# Patient Record
Sex: Male | Born: 1965 | Race: White | Hispanic: No | Marital: Single | Smoking: Current every day smoker
Health system: Southern US, Community
[De-identification: ages and names within clinical notes are randomized; demographics above are authoritative.]

## PROBLEM LIST (undated history)

## (undated) DIAGNOSIS — I639 Cerebral infarction, unspecified: Secondary | ICD-10-CM

## (undated) DIAGNOSIS — I519 Heart disease, unspecified: Secondary | ICD-10-CM

## (undated) DIAGNOSIS — I2510 Atherosclerotic heart disease of native coronary artery without angina pectoris: Secondary | ICD-10-CM

## (undated) DIAGNOSIS — I701 Atherosclerosis of renal artery: Secondary | ICD-10-CM

## (undated) DIAGNOSIS — R51 Headache: Secondary | ICD-10-CM

## (undated) DIAGNOSIS — E785 Hyperlipidemia, unspecified: Secondary | ICD-10-CM

## (undated) DIAGNOSIS — F418 Other specified anxiety disorders: Secondary | ICD-10-CM

## (undated) DIAGNOSIS — E119 Type 2 diabetes mellitus without complications: Secondary | ICD-10-CM

## (undated) DIAGNOSIS — K219 Gastro-esophageal reflux disease without esophagitis: Secondary | ICD-10-CM

## (undated) DIAGNOSIS — I10 Essential (primary) hypertension: Secondary | ICD-10-CM

## (undated) DIAGNOSIS — I739 Peripheral vascular disease, unspecified: Secondary | ICD-10-CM

## (undated) DIAGNOSIS — K859 Acute pancreatitis without necrosis or infection, unspecified: Secondary | ICD-10-CM

## (undated) DIAGNOSIS — F1020 Alcohol dependence, uncomplicated: Secondary | ICD-10-CM

## (undated) HISTORY — PX: OTHER SURGICAL HISTORY: SHX169

## (undated) HISTORY — DX: Acute pancreatitis without necrosis or infection, unspecified: K85.90

## (undated) HISTORY — DX: Essential (primary) hypertension: I10

## (undated) HISTORY — DX: Gastro-esophageal reflux disease without esophagitis: K21.9

## (undated) HISTORY — DX: Heart disease, unspecified: I51.9

## (undated) HISTORY — DX: Headache: R51

## (undated) SURGERY — CORONARY STENT INTERVENTION
Anesthesia: Moderate Sedation | Laterality: Left

---

## 2007-12-03 ENCOUNTER — Ambulatory Visit: Payer: Self-pay | Admitting: Gastroenterology

## 2007-12-09 ENCOUNTER — Ambulatory Visit: Payer: Self-pay | Admitting: Gastroenterology

## 2007-12-09 ENCOUNTER — Encounter: Payer: Self-pay | Admitting: Gastroenterology

## 2007-12-16 ENCOUNTER — Ambulatory Visit: Payer: Self-pay | Admitting: Gastroenterology

## 2007-12-28 ENCOUNTER — Ambulatory Visit: Payer: Self-pay | Admitting: Gastroenterology

## 2008-01-05 ENCOUNTER — Ambulatory Visit: Payer: Self-pay | Admitting: Gastroenterology

## 2008-01-12 ENCOUNTER — Ambulatory Visit: Payer: Self-pay | Admitting: Gastroenterology

## 2008-01-26 ENCOUNTER — Ambulatory Visit: Payer: Self-pay | Admitting: Gastroenterology

## 2008-03-21 ENCOUNTER — Ambulatory Visit: Payer: Self-pay | Admitting: Gastroenterology

## 2009-06-27 ENCOUNTER — Inpatient Hospital Stay: Payer: Self-pay | Admitting: Internal Medicine

## 2009-09-23 DIAGNOSIS — K859 Acute pancreatitis without necrosis or infection, unspecified: Secondary | ICD-10-CM

## 2009-09-23 HISTORY — DX: Acute pancreatitis without necrosis or infection, unspecified: K85.90

## 2010-01-23 ENCOUNTER — Inpatient Hospital Stay (HOSPITAL_COMMUNITY): Admission: EM | Admit: 2010-01-23 | Discharge: 2010-01-25 | Payer: Self-pay | Admitting: Emergency Medicine

## 2010-12-11 LAB — CBC
HCT: 34.7 % — ABNORMAL LOW (ref 39.0–52.0)
HCT: 35.2 % — ABNORMAL LOW (ref 39.0–52.0)
HCT: 42 % (ref 39.0–52.0)
Hemoglobin: 12.1 g/dL — ABNORMAL LOW (ref 13.0–17.0)
Hemoglobin: 12.1 g/dL — ABNORMAL LOW (ref 13.0–17.0)
Hemoglobin: 14.8 g/dL (ref 13.0–17.0)
MCHC: 35.3 g/dL (ref 30.0–36.0)
MCV: 106.9 fL — ABNORMAL HIGH (ref 78.0–100.0)
Platelets: 112 10*3/uL — ABNORMAL LOW (ref 150–400)
RDW: 15.8 % — ABNORMAL HIGH (ref 11.5–15.5)
RDW: 15.8 % — ABNORMAL HIGH (ref 11.5–15.5)
WBC: 10.2 10*3/uL (ref 4.0–10.5)
WBC: 11.3 10*3/uL — ABNORMAL HIGH (ref 4.0–10.5)

## 2010-12-11 LAB — DIFFERENTIAL
Basophils Relative: 1 % (ref 0–1)
Eosinophils Absolute: 0 10*3/uL (ref 0.0–0.7)
Monocytes Absolute: 0.5 10*3/uL (ref 0.1–1.0)

## 2010-12-11 LAB — COMPREHENSIVE METABOLIC PANEL
ALT: 108 U/L — ABNORMAL HIGH (ref 0–53)
ALT: 70 U/L — ABNORMAL HIGH (ref 0–53)
AST: 106 U/L — ABNORMAL HIGH (ref 0–37)
AST: 146 U/L — ABNORMAL HIGH (ref 0–37)
Albumin: 2.6 g/dL — ABNORMAL LOW (ref 3.5–5.2)
Alkaline Phosphatase: 135 U/L — ABNORMAL HIGH (ref 39–117)
Alkaline Phosphatase: 169 U/L — ABNORMAL HIGH (ref 39–117)
BUN: 8 mg/dL (ref 6–23)
CO2: 26 mEq/L (ref 19–32)
CO2: 27 mEq/L (ref 19–32)
Calcium: 8.2 mg/dL — ABNORMAL LOW (ref 8.4–10.5)
Calcium: 8.7 mg/dL (ref 8.4–10.5)
Chloride: 106 mEq/L (ref 96–112)
Creatinine, Ser: 0.72 mg/dL (ref 0.4–1.5)
Creatinine, Ser: 0.72 mg/dL (ref 0.4–1.5)
Creatinine, Ser: 0.78 mg/dL (ref 0.4–1.5)
GFR calc Af Amer: >60 mL/min (ref >60)
GFR calc Af Amer: >60 mL/min (ref >60)
GFR calc non Af Amer: >60 mL/min (ref >60)
Glucose, Bld: 100 mg/dL — ABNORMAL HIGH (ref 70–99)
Potassium: 3.1 mEq/L — ABNORMAL LOW (ref 3.5–5.1)
Sodium: 138 mEq/L (ref 135–145)
Sodium: 139 mEq/L (ref 135–145)
Total Bilirubin: 0.7 mg/dL (ref 0.3–1.2)
Total Bilirubin: 1.2 mg/dL (ref 0.3–1.2)
Total Protein: 5.7 g/dL — ABNORMAL LOW (ref 6.0–8.3)

## 2010-12-11 LAB — LIPASE, BLOOD: Lipase: 51 U/L (ref 11–59)

## 2011-02-20 ENCOUNTER — Encounter: Payer: Self-pay | Admitting: Internal Medicine

## 2011-02-20 ENCOUNTER — Emergency Department (HOSPITAL_COMMUNITY): Payer: Self-pay

## 2011-02-20 ENCOUNTER — Observation Stay (HOSPITAL_COMMUNITY)
Admission: EM | Admit: 2011-02-20 | Discharge: 2011-02-21 | Disposition: A | Discharge status: Home / Self Care | Payer: Self-pay | Attending: Internal Medicine | Admitting: Internal Medicine

## 2011-02-20 DIAGNOSIS — F1010 Alcohol abuse, uncomplicated: Secondary | ICD-10-CM | POA: Insufficient documentation

## 2011-02-20 DIAGNOSIS — F17200 Nicotine dependence, unspecified, uncomplicated: Secondary | ICD-10-CM | POA: Insufficient documentation

## 2011-02-20 DIAGNOSIS — K859 Acute pancreatitis without necrosis or infection, unspecified: Secondary | ICD-10-CM

## 2011-02-20 DIAGNOSIS — G40909 Epilepsy, unspecified, not intractable, without status epilepticus: Secondary | ICD-10-CM | POA: Insufficient documentation

## 2011-02-20 DIAGNOSIS — R079 Chest pain, unspecified: Principal | ICD-10-CM | POA: Insufficient documentation

## 2011-02-20 DIAGNOSIS — E785 Hyperlipidemia, unspecified: Secondary | ICD-10-CM | POA: Insufficient documentation

## 2011-02-20 DIAGNOSIS — R059 Cough, unspecified: Secondary | ICD-10-CM | POA: Insufficient documentation

## 2011-02-20 DIAGNOSIS — R509 Fever, unspecified: Secondary | ICD-10-CM | POA: Insufficient documentation

## 2011-02-20 DIAGNOSIS — R05 Cough: Secondary | ICD-10-CM | POA: Insufficient documentation

## 2011-02-20 LAB — CBC
HCT: 42.6 % (ref 39.0–52.0)
MCH: 34.5 pg — ABNORMAL HIGH (ref 26.0–34.0)
MCV: 97.9 fL (ref 78.0–100.0)
RBC: 4.35 MIL/uL (ref 4.22–5.81)

## 2011-02-20 LAB — CARDIAC PANEL(CRET KIN+CKTOT+MB+TROPI)
CK, MB: 1.5 ng/mL (ref 0.3–4.0)
Relative Index: 1.3 (ref 0.0–2.5)
Total CK: 56 U/L (ref 7–232)
Troponin I: <0.3 ng/mL (ref <0.30)
Troponin I: <0.3 ng/mL (ref <0.30)

## 2011-02-20 LAB — DIFFERENTIAL
Basophils Absolute: 0 10*3/uL (ref 0.0–0.1)
Basophils Relative: 0 % (ref 0–1)
Eosinophils Absolute: 0 10*3/uL (ref 0.0–0.7)
Lymphocytes Relative: 11 % — ABNORMAL LOW (ref 12–46)
Monocytes Absolute: 1.1 10*3/uL — ABNORMAL HIGH (ref 0.1–1.0)
Monocytes Relative: 12 % (ref 3–12)

## 2011-02-20 LAB — URINE MICROSCOPIC-ADD ON

## 2011-02-20 LAB — URINALYSIS, ROUTINE W REFLEX MICROSCOPIC
Glucose, UA: NEGATIVE mg/dL
Hgb urine dipstick: NEGATIVE
Protein, ur: NEGATIVE mg/dL
Urobilinogen, UA: 1 mg/dL (ref 0.0–1.0)

## 2011-02-20 LAB — COMPREHENSIVE METABOLIC PANEL
ALT: 59 U/L — ABNORMAL HIGH (ref 0–53)
AST: 52 U/L — ABNORMAL HIGH (ref 0–37)
Albumin: 3.5 g/dL (ref 3.5–5.2)
CO2: 28 mEq/L (ref 19–32)
Creatinine, Ser: 0.54 mg/dL (ref 0.4–1.5)
GFR calc non Af Amer: >60 mL/min (ref >60)
Sodium: 134 mEq/L — ABNORMAL LOW (ref 135–145)
Total Protein: 7.3 g/dL (ref 6.0–8.3)

## 2011-02-20 LAB — TROPONIN I: Troponin I: <0.3 ng/mL (ref <0.30)

## 2011-02-20 LAB — LIPASE, BLOOD: Lipase: 240 U/L — ABNORMAL HIGH (ref 11–59)

## 2011-02-20 MED ORDER — IOHEXOL 300 MG/ML  SOLN
75.0000 mL | Freq: Once | INTRAMUSCULAR | Status: AC | PRN
  Administered 2011-02-20: 75 mL via INTRAVENOUS

## 2011-02-20 NOTE — Progress Notes (Unsigned)
Hospital Admission Note Date: 02/20/2011  Patient name: Brandon [name] Medical record number: [id number] Date of birth: 1965/10/30 Age: 45 y.o. Gender: male PCP: No primary provider on file.  Medical Service: Internal Medicine  Attending physician:  Dr. Blanch Media    Resident [phone]):  Dr. Narda Bonds   Pager: [phone] Resident (R1):  Dr. Baltazar Apo   Pager: [phone]  Chief Complaint: Acute Pancreatitis   History of Present Illness:   Brandon [name] is a 45 year old man with pmh significant for Alcohol abuse, hx of pancreatitis back in May 2011 2/2 alcohol who presents to the ED today for pain located in his left chest and epigastrium which radiated to his left arm, and shoulder. He described the pain as sharp and 8/10 in intensity. The patient states this pain is different from his pain that he had back in May of last year. He states he was short of breath and was sweating when this pain first began. He also endorses feeling nauseated, however did not vomit. He also complained of constipation and states his last bm was a few days ago. The chest pain was aggravated by certain movements such as laying on that side, and relieved by Dilaudid and Ketoralac that was given in the ED. Of note, patient has a heavy alcohol abuse history. His fiance states he has several alcohol beverages daily. His last drink was on Memorial Day. His fiance also states he gets "shaky" when he does not have alcohol. Upon receiving pain medication, patient was no longer diaphoretic, or short of breath, and states he had felt better, and wanted to eat something.    Allergies: NKDA  Past Medical History  1.) Pancreatitis 2.) Alcoholism  3.) ? HLD 4.) Tobacco abuse 5.) Anxiety    Social History  . Marital Status: Single - Common Law, lives with girlfriend for last 5 years  . Years of Education: Completed high school and finished some college   Occupational History  . 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 Smoking status: Current smoker, 1 PPD for last 20 years   . Alcohol Use: Current everyday - heavy  . Drug Use: None   Family History:  Mother - deceased 2 years ago from stroke, also had significant CAD, with CABG in past Father - alive, hx of DM and HTN  Review of Systems: Pertinent items are noted in HPI.  Physical Exam:  T: 98.1  BP: 156/101  P:89  R:20  O2 Sats: 98% RA  General appearance: alert and cooperative Head: Normocephalic, without obvious abnormality, atraumatic Lungs: clear to auscultation bilaterally Heart: regular rate and rhythm, S1, S2 normal, no murmur, click, rub or gallop Abdomen: soft, non-tender; bowel sounds normal; no masses,  no organomegaly Extremities: extremities normal, atraumatic, no cyanosis or edema Pulses: 2+ and symmetric Skin: Skin color, texture, turgor normal. No rashes or lesions Neurologic: Grossly normal  Lab results:  CBC:    Component Value Date/Time   WBC 9.8 02/20/2011 0954   HGB 15.0 02/20/2011 0954   HCT 42.6 02/20/2011 0954   PLT 105* 02/20/2011 0954   MCV 97.9 02/20/2011 0954   NEUTROABS 7.6 02/20/2011 0954   LYMPHSABS 1.0 02/20/2011 0954   MONOABS 1.1* 02/20/2011 0954   EOSABS 0.0 02/20/2011 0954   BASOSABS 0.0 02/20/2011 0954     Comprehensive Metabolic Panel:    Component Value Date/Time   NA 134* 02/20/2011 0954  K 3.7 02/20/2011 0954   CL 95* 02/20/2011 0954   CO2 28 02/20/2011 0954   BUN 8 02/20/2011 0954   CREATININE 0.54 02/20/2011 0954   GLUCOSE 163* 02/20/2011 0954   CALCIUM 9.3 02/20/2011 0954   AST 52* 02/20/2011 0954   ALT 59* 02/20/2011 0954   ALKPHOS 151* 02/20/2011 0954   BILITOT 0.9 02/20/2011 0954   PROT 7.3 02/20/2011 0954   ALBUMIN 3.5 02/20/2011 0954    Lab Results  Component Value Date   LIPASE 240* 02/20/2011    Lab Results  Component Value Date   CKTOTAL 73 02/20/2011   TROPONINI  Value: <0.30        Due to the release kinetics of cTnI, a  negative result within the first hours of the onset of symptoms does not rule out myocardial infarction with certainty. If myocardial infarction is still suspected, repeat the test at appropriate intervals. **Please note change in reference range.*** 02/20/2011    Color, Urine                             AMBER      a      YELLOW    BIOCHEMICALS MAY BE AFFECTED BY COLOR  Appearance                               CLEAR             CLEAR  Specific Gravity                         1.016             1.005-1.030  pH                                       6.5               5.0-8.0  Urine Glucose                            NEGATIVE          NEG              mg/dL  Bilirubin                                SMALL      a      NEG  Ketones                                  15         a      NEG              mg/dL  Blood                                    NEGATIVE          NEG  Protein  NEGATIVE          NEG              mg/dL  Urobilinogen                             1.0               0.0-1.0          mg/dL  Nitrite                                  NEGATIVE          NEG  Leukocytes                               TRACE      a      NEG  Imaging results:   CT Angio -  IMPRESSION:    1.  No evidence for pulmonary emboli.   2.  Probable scarring in the right middle lobe and right lower lobe   - no definite active pulmonary disease.   3.  Thoracic aorta normal.   4.  Fatty infiltration of the liver.   5.  Question changes of pancreatitis.  See report.  CXR - 2 View: IMPRESSION: Chronic bronchitic type changes - no acute findings.   Assessment & Plan by Problem:  1.) Chest Pain/Epigastric Pain - Given prior history of pancreatitis and elevated lipase, this is likely secondary to acute pancreatitis. Patient was previously admitted in May 2011 for pancreatitis that was thought to be secondary to alcohol. Patient had U/S and CT abd/pelvis that showed inflammation of the pancreas  with pseudocyst. U/S was negative for gallstones. Given description of pain in the chest, associated with diaphoresis and radiation to left arm, this may be cardiac in nature, due to acute coronary syndrome. Gastritis is also on the differential given clinical presentation and alcohol history. Patient also had CT angio of chest done to rule out PE, and of note, it was negative. CXR negative for widened mediastinum, and no other findings to suggest dissection.   Plan: -Admit to telemetry -Cycle cardiac enzymes -Dilaudid for pain control  -NPO, and bowel rest -NS at 125 cc/hr  -12-Lead EKG  -FLP and Hgb A1C for risk stratification as well as checking triglycerides, given pancreatitis  -PPI   2. Alcohol abuse and possible withdrawal - Patient admits to heavy alcohol use. Last drink on Memorial Day. Has tried to quit in the past, but unsuccessful.   Plan: -Admit to telemetry -CIWA protocol (Thiamine, and Folate) -Transfer to SDU if unstable  -SW consult to assist with counseling and cessation   3. Tobacco abuse - Nicotine patch   4. VTE: Lovenox

## 2011-02-21 LAB — BASIC METABOLIC PANEL
Calcium: 8.5 mg/dL (ref 8.4–10.5)
Glucose, Bld: 94 mg/dL (ref 70–99)
Potassium: 3.4 mEq/L — ABNORMAL LOW (ref 3.5–5.1)
Sodium: 136 mEq/L (ref 135–145)

## 2011-02-21 LAB — CBC
HCT: 40.6 % (ref 39.0–52.0)
Hemoglobin: 14.1 g/dL (ref 13.0–17.0)
MCHC: 34.7 g/dL (ref 30.0–36.0)
RDW: 13.4 % (ref 11.5–15.5)
WBC: 7 10*3/uL (ref 4.0–10.5)

## 2011-02-21 LAB — LIPID PANEL
Cholesterol: 150 mg/dL (ref 0–200)
HDL: 33 mg/dL — ABNORMAL LOW (ref >39)
Triglycerides: 101 mg/dL (ref <150)

## 2011-02-25 ENCOUNTER — Encounter: Payer: Self-pay | Admitting: Internal Medicine

## 2011-02-25 ENCOUNTER — Ambulatory Visit (INDEPENDENT_AMBULATORY_CARE_PROVIDER_SITE_OTHER): Payer: Self-pay | Admitting: Internal Medicine

## 2011-02-25 VITALS — BP 109/71 | HR 92 | Temp 97.4°F | Ht 73.0 in | Wt 218.0 lb

## 2011-02-25 DIAGNOSIS — R3989 Other symptoms and signs involving the genitourinary system: Secondary | ICD-10-CM | POA: Insufficient documentation

## 2011-02-25 DIAGNOSIS — N399 Disorder of urinary system, unspecified: Secondary | ICD-10-CM

## 2011-02-25 DIAGNOSIS — F10239 Alcohol dependence with withdrawal, unspecified: Secondary | ICD-10-CM | POA: Insufficient documentation

## 2011-02-25 DIAGNOSIS — K859 Acute pancreatitis without necrosis or infection, unspecified: Secondary | ICD-10-CM | POA: Insufficient documentation

## 2011-02-25 DIAGNOSIS — K7689 Other specified diseases of liver: Secondary | ICD-10-CM

## 2011-02-25 DIAGNOSIS — D696 Thrombocytopenia, unspecified: Secondary | ICD-10-CM

## 2011-02-25 DIAGNOSIS — K219 Gastro-esophageal reflux disease without esophagitis: Secondary | ICD-10-CM | POA: Insufficient documentation

## 2011-02-25 DIAGNOSIS — K5900 Constipation, unspecified: Secondary | ICD-10-CM | POA: Insufficient documentation

## 2011-02-25 DIAGNOSIS — F1020 Alcohol dependence, uncomplicated: Secondary | ICD-10-CM

## 2011-02-25 DIAGNOSIS — N529 Male erectile dysfunction, unspecified: Secondary | ICD-10-CM

## 2011-02-25 DIAGNOSIS — R569 Unspecified convulsions: Secondary | ICD-10-CM

## 2011-02-25 DIAGNOSIS — G4700 Insomnia, unspecified: Secondary | ICD-10-CM

## 2011-02-25 DIAGNOSIS — I10 Essential (primary) hypertension: Secondary | ICD-10-CM | POA: Insufficient documentation

## 2011-02-25 DIAGNOSIS — K760 Fatty (change of) liver, not elsewhere classified: Secondary | ICD-10-CM | POA: Insufficient documentation

## 2011-02-25 MED ORDER — ESOMEPRAZOLE MAGNESIUM 20 MG PO CPDR: 20.0000 mg | DELAYED_RELEASE_CAPSULE | Freq: Every day | ORAL | Status: DC

## 2011-02-25 MED ORDER — DOCUSATE SODIUM 100 MG PO CAPS: 100.0000 mg | ORAL_CAPSULE | Freq: Two times a day (BID) | ORAL | Status: DC | PRN

## 2011-02-25 MED ORDER — POLYETHYLENE GLYCOL 3350 17 GM/SCOOP PO POWD: 17.0000 g | Freq: Every day | ORAL | Status: DC | PRN

## 2011-02-25 NOTE — Patient Instructions (Signed)
Please follow-up with [place] in 1-2 months for blood work and a blood pressure check. I have given you information about AA meetings in your area.   Stop taking the blood pressure medicine carvdilol.   You have been given a prescription for reflux medication. You may try colace and miralax as instructed for constipation.   You had labwork done today - if there are any abnormalities I will call you.

## 2011-02-25 NOTE — Assessment & Plan Note (Signed)
I suspect the patient's abdominal pain could potentially be related to chronic alcohol abuse leading to chronic pancreatitis. Also suboptimally treated reflux. He's had some difficulty in advancing his diet and I gave him advice on starting with low-fat bland foods before progressing to food such as Ensure, which he said was causing him some discomfort in the last few days, or or high fat foods.  When he returns in 6 weeks he should have lab work including a hepatitis panel and liver function tests.  The patient would prefer not to be stuck today given that he was recently hospitalized and had lots of blood work then. I think this is fine.

## 2011-02-25 NOTE — Assessment & Plan Note (Signed)
I suspect this is related to his alcoholism. Will defer further workup of this until the patient is unable to abstain from alcohol for a longer period of time.

## 2011-02-25 NOTE — Assessment & Plan Note (Signed)
Patient was previously treated at [facility]. He received trazodone from them. We refilled this apparently on hospital discharge. He's not currently living [facility] but states to be moving back sooner and would like to followup with them when he does return to the [place].

## 2011-02-25 NOTE — Assessment & Plan Note (Signed)
The patient had been using a family member's Nexium. I prescribed this for him today.

## 2011-02-25 NOTE — Assessment & Plan Note (Signed)
Patient stopped taking his beta blocker after being evaluated by EMS 2 days ago. His blood pressure measurements at home have been around 120/80 per his wife. I imagine that his hypertension was related to his alcohol use - unfortunately I do not have the discharge summary yet from his most recent hospitalization. It seems that since he has not been drinking his blood pressures been normal. The patient back in about 6 weeks for blood pressure check.

## 2011-02-25 NOTE — Assessment & Plan Note (Signed)
Patient has a history of alcohol withdrawal seizure in the past. Otherwise he has no history of seizures. His last drink was approximately 5 days before his brief seizure 2 days ago.  An evaluation by EMS the patient refused hospitalization. He agreed however to follow up with [place] it is for this reason he presents here today. Please see his other medical problems for a full description of the discussion that we had regarding the patient's alcoholism. Per his wife and per her description the patient appears much improved today is that he was doing on that day the seizure, and the patient states that he does want to quit drinking.

## 2011-02-25 NOTE — Assessment & Plan Note (Signed)
This is likely related to poor oral intake. We discussed physical activity starting with bland solid foods such as rice and using Colace and MiraLax as needed for constipation.  The patient has a relatively benign abdominal exam today, and I am not concerned for obstruction at this time.

## 2011-02-25 NOTE — Progress Notes (Signed)
  Subjective:    Patient ID: Brandon [name], male    DOB: 16-Nov-1965, 45 y.o.   MRN: [id number]  HPI Comments: Patient presents today after suffering a seizure 2 days ago.  Patient was lying around the house, his wife was checking his BP since discharge - per wife he looked paile and his BP was 80/60.  He had taken his BP medicine that morning.  When he went to reach for something when sitting on the side of the bed, the wife noticed he started to jerk around and have a seizure.  She laid him on the floor on his side.  He shook for 3-4 seconds and then was "foggy" for 3-4 seconds.   Patient at the time of the seizure was complaining of SOB, abdominal pain.  Per wife very pale and weak.  Not able to eat much except yogurt and ensure and pedialyte and fruit juice.   Patient states he has had a seizure in the 1990s which was attributed to alcoholism.   Patient states his last drink was 1 week ago, making the day of his seizure 5 days after his last drink.   Right now patient says he feels much better.  Still has some lower chest and upper abdominal pain bilaterally, worse with cough or deep breathing.  Eating does make the abdominal pain worse.    No bowel movement since leaving the hospital.  No trouble with constipation when eating, though as patient notes, he has not been eating much since discharge.    Past few months has had trouble with urinary stream for the past few months - it has been weak, some dribbling, some incontinence.  Urine is a little bit red, dark.  No penile pain or itching.  No penile discharge.  No change in appearance of semen.   Unsure if completely emptying bladder when urinating.  Has trouble getting and maintaining an erection.    Reports compliance with hospital discharge meds.     Review of Systems  Constitutional: Negative for fever and chills.  Eyes: Negative for visual disturbance.  Respiratory: Negative for shortness of breath.   Cardiovascular: Negative for chest  pain and leg swelling.  Genitourinary: Negative for hematuria.  Musculoskeletal: Negative for gait problem.  Neurological: Negative for weakness.  Hematological: Negative for adenopathy.  Psychiatric/Behavioral: Negative for behavioral problems and agitation.       Objective:   Physical Exam  Constitutional: He is oriented to person, place, and time. He appears well-developed and well-nourished. No distress.  HENT:  Head: Normocephalic and atraumatic.  Mouth/Throat: No oropharyngeal exudate.  Eyes: Conjunctivae and EOM are normal. Pupils are equal, round, and reactive to light.  Neck: Normal range of motion. Neck supple.  Cardiovascular: Normal rate, regular rhythm and intact distal pulses.   No murmur heard. Pulmonary/Chest: Effort normal and breath sounds normal. He has no wheezes.  Abdominal: Soft. Bowel sounds are normal. He exhibits no mass. There is no rebound and no guarding.       Mildly distended abdomen, non-tender.  +bs.    Musculoskeletal: Normal range of motion. He exhibits no edema and no tenderness.  Neurological: He is alert and oriented to person, place, and time. No cranial nerve deficit. Coordination normal.  Skin: Skin is warm and dry. No rash noted.  Psychiatric: He has a normal mood and affect. His behavior is normal. Judgment and thought content normal.          Assessment & Plan:

## 2011-02-25 NOTE — Assessment & Plan Note (Signed)
Patient is a strong family history of alcoholism and has been drinking for several decades. Per his wife he was drinking at least 12 beers a day. Patient states he's quit on 3 previous occasions. He is before been in rehabilitation and gone to [employer]. He is interested in quitting and understands that if he does quit it needs to be that he drinks no alcohol, not even a little bit. I discussed at length with him the consequences of long-term alcohol abuse and how we are already starting to see some of these with his low platelet count, pancreatitis, liver dysfunction, and some of his genitourinary complaints. The patient asked for permission about AA meetings in this area and I provided him with those phone numbers. His last drink was approximately a week ago.  For now I instructed him to continue his vitamin supplementation.

## 2011-02-25 NOTE — Assessment & Plan Note (Signed)
Patient had noted severe hepatic steatosis on abdominal CT in May 2012. I discussed this with the patient and explained to him this is likely secondary to his alcoholism and how this may lead to cirrhosis if he continues to drink.

## 2011-02-25 NOTE — Assessment & Plan Note (Signed)
The patient is a bit young for prostatism though this is not unheard of. I discussed the case with Dr. Aundria Rud. It seems most likely that the patient could be suffering from early benign prostatic hypertrophy or a urinary tract infection. We will get a urinalysis with reflex microscopy today. At followup the patient should be asked about his urinary symptoms. If he would like to be empirically treated for BPH one could start with tarazosin, as this is generic and on the $4 to [facility].  The patient preferred to defer treatment at this time.  We discussed performing a PSA test today, and after discussion, we decided that first we will check a urinalysis and then only in the future if his symptoms continue (especially if they continue despite therapy) and a PSA test may be appropriate. In this situation it would not be a screening test but rather a diagnostic test.

## 2011-02-26 LAB — URINALYSIS, ROUTINE W REFLEX MICROSCOPIC
Bilirubin Urine: NEGATIVE
Glucose, UA: NEGATIVE mg/dL
Specific Gravity, Urine: 1.023 (ref 1.005–1.030)
pH: 6 (ref 5.0–8.0)

## 2011-02-26 LAB — URINALYSIS, MICROSCOPIC ONLY
Casts: NONE SEEN
Crystals: NONE SEEN
RBC / HPF: NONE SEEN RBC/hpf (ref <3)
Squamous Epithelial / LPF: NONE SEEN
WBC, UA: NONE SEEN WBC/hpf (ref <3)

## 2011-03-05 ENCOUNTER — Encounter: Payer: Self-pay | Admitting: Internal Medicine

## 2011-03-20 NOTE — Discharge Summary (Signed)
NAMEPHELAN, SCHADT NO.:  [id number]  MEDICAL RECORD NO.:  [id number]  LOCATION:  4738                         FACILITY:  [facility]  PHYSICIAN:  Blanch Media, M.D.DATE OF BIRTH:  19-Mar-1966  DATE OF ADMISSION:  02/20/2011 DATE OF DISCHARGE:  02/21/2011                              DISCHARGE SUMMARY   DISCHARGE DIAGNOSIS: 1. Acute pancreatitis. 2. Alcoholism. 3. Hyperlipidemia. 4. Tobacco abuse. 5. Anxiety. 6. Elevated blood pressure.  DISCHARGE MEDICATIONS: 1. Coreg 3.125 mg tablets, take 1 tablet by mouth two times daily. 2. Aspirin 81 mg tablets. 3. Ibuprofen over the counter, take 2 tablets by mouth daily as needed     for pain. 4. Multivitamin tablets, take 1 tablet by mouth daily. 5. Nicotine 21 mg per 24-hour patch to use one patch transdermally     daily. 6. Trazodone 150 mg tablets to take 1 tablet by mouth daily at     bedtime.  DISPOSITION AND FOLLOWUP:  Mr. Crysler is to follow up at the [facility] for reevaluation of his pancreatitis as well as management of his elevated blood pressures and he will also likely benefit from additional counseling for his substance abuse history. Patient will be contacted for an appointment date and time.  PROCEDURES PERFORMED: 1. Chest x-ray on Feb 20, 2011, showed chronic bronchitic type changes     without any acute findings. 2. CT angiogram of the chest on Feb 20, 2011, showed the following, no     evidence of pulmonary emboli, probable scarring in the right middle     lobe and right lower lobe without definite active pulmonary     disease.  Thoracic aorta normal.  There was fatty infiltration of     the liver and there was questionable changes of pancreatitis.  CONSULTATIONS:  None.  ADMISSION HISTORY AND PHYSICAL:  The patient is a 45 year old man with a history of alcohol abuse, history of pancreatitis in May 2011 secondary to alcohol, who presented to the ED for pain  located in the left chest and epigastrium which radiated to the patient's left arm and shoulder. They described the pain as sharp and 8/10 in intensity.  The patient states the pain is different from his pain that he had in May 2012.  He states he was short of breath and was sweating when this pain first began.  He also endorsed feeling nauseated; however, he did not vomit. He also complained of constipation and stated his last bowel movement was a few days ago.  Chest pain was aggravated by certain movements such as leaning on bedside and relieved by Dilaudid and ketorolac that was given in the ED.  Of note, the patient has a heavy alcohol abuse history.  His fiancee states he has several alcoholic beverages daily. His last drink was on the memorial day.  His fiancee also states he gets shaky when he does not have alcohol.  Upon receiving pain medications, the patient was no longer diaphoretic or short of breath and he states he felt better and wanted to eat something.  PHYSICAL EXAMINATION:  VITAL SIGNS:  Temperature 98.1, blood pressure 156/101, pulse 89, respirations 20, oxygen saturation 98% on  room air. GENERAL APPEARANCE:  Alert and cooperative. HEAD:  Normocephalic without obvious abnormality, atraumatic. LUNGS:  Clear to auscultation bilaterally. HEART:  Regular rate and rhythm.  S1 and S2 normal.  No murmurs, clicks, rubs, or gallops. ABDOMEN:  Soft, nontender.  Bowel sounds normal.  No masses, organomegaly. EXTREMITIES:  Normal atraumatic.  No cyanosis or edema.  Pulses 2+ and symmetric. SKIN:  Skin texture, color, and turgor is normal.  No rashes or lesions. NEUROLOGIC:  Grossly normal.  LABORATORY DATA:  White blood cell 9.8, hemoglobin 15, hematocrit 42.6, platelets 105.  Sodium 134, potassium 3.7, chloride 95, bicarb 28, BUN 8, creatinine 0.54, glucose 163, calcium 9.3, AST 52, ALT 59, alk phos 161.  Total bilirubin 0.9, total protein 7.3, albumin 3.5, lipase  240.  HOSPITAL COURSE BY PROBLEMS: 1. Left-sided chest pain/epigastric pain, although the patient's     initial presentation was initially concerning for an ACS, this was     subsequently ruled out with negative cardiac enzymes x3 as well as     negative EKG that did not show any concerning changes.  However,     his symptoms were most consistent with an acute pancreatitis in the     setting of his prior history, elevated lipase, as well as imaging     findings.  The patient was admitted and conservatively managed with     IV fluids and pain control, and he was initially placed on n.p.o.     However, by the following day into his admission the patient stated     that his pain had completely resolved and at that point was able to     tolerate clear liquid diet without any nausea or vomiting.  The     patient at this point was also no longer needing any pain     medications.  He was discharged home in stable and improved     condition and advised to continue with only clear liquid diet and     only to advance as tolerated.  He will follow up with the     outpatient clinics for further monitoring. 2. Alcohol abuse.  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  During his admission, he was placed     on the CIWA protocol for alcohol withdrawal and fortunately the     patient was not needing any Ativan.  Although he was not in active     withdrawal, his blood pressure was elevated, however, he was not     tachycardic.  For this, he was started on low-dose beta-blocker for     adequate blood pressure control.  He was also continued on thiamine     and folate supplements and was also seen by Social Work for     counseling for alcohol abuse cessation.  These discussions can     continue on an outpatient basis. 3. Elevated blood pressure.  The patient's systolic blood pressure was     found  to be elevated into the 150s and diastolics in the 100s.     Given his risk for alcohol withdrawal, he was started on a low-dose     beta-blocker which consisted of Coreg 3.125 mg twice a day.  Again,     the patient's blood pressure is to be checked on an outpatient  basis and this dose can be adjusted as needed.  DISPOSITION:  The patient was discharged home in stable and improved condition, advised to continue with clear liquid diet in the meantime and advance as tolerated.  Of note, he was started on Coreg 3.125 mg b.i.d. and again this dose can be titrated on an outpatient basis. Counseling regarding substance abuse should also be continued on an outpatient basis.  DISCHARGE VITAL SIGNS:  Temperature 98.1, pulse 73, respirations 20, blood pressure 147/90, oxygen saturation 96% on room air.  DISCHARGE LABORATORY DATA:  Hemoglobin A1c of 6.4.  Lipid panel as follows, total cholesterol 150, triglycerides 101, HDL 33, LDL 97. Sodium 136, potassium 3.4, chloride 100, bicarb 25, BUN 7, creatinine 0.74.  Blood glucose 94.  White blood cell 7, hemoglobin 14.1, hematocrit 40.6, platelets 105.    ______________________________ Jaci Lazier, MD   ______________________________ Blanch Media, M.D.  NI/MEDQ  D:  03/01/2011  T:  03/01/2011  Job:  [id number]  cc:   [facility].  Electronically Signed by Jaci Lazier MD on 03/08/2011 01:08:35 AM Electronically Signed by Blanch Media M.D. on 03/20/2011 11:56:42 AM

## 2011-04-01 ENCOUNTER — Ambulatory Visit (INDEPENDENT_AMBULATORY_CARE_PROVIDER_SITE_OTHER): Payer: Self-pay | Admitting: Internal Medicine

## 2011-04-01 ENCOUNTER — Encounter: Payer: Self-pay | Admitting: Internal Medicine

## 2011-04-01 DIAGNOSIS — N399 Disorder of urinary system, unspecified: Secondary | ICD-10-CM

## 2011-04-01 DIAGNOSIS — K760 Fatty (change of) liver, not elsewhere classified: Secondary | ICD-10-CM

## 2011-04-01 DIAGNOSIS — N529 Male erectile dysfunction, unspecified: Secondary | ICD-10-CM

## 2011-04-01 DIAGNOSIS — R3989 Other symptoms and signs involving the genitourinary system: Secondary | ICD-10-CM

## 2011-04-01 DIAGNOSIS — K219 Gastro-esophageal reflux disease without esophagitis: Secondary | ICD-10-CM

## 2011-04-01 DIAGNOSIS — K5900 Constipation, unspecified: Secondary | ICD-10-CM

## 2011-04-01 DIAGNOSIS — F1020 Alcohol dependence, uncomplicated: Secondary | ICD-10-CM

## 2011-04-01 DIAGNOSIS — I10 Essential (primary) hypertension: Secondary | ICD-10-CM

## 2011-04-01 DIAGNOSIS — K7689 Other specified diseases of liver: Secondary | ICD-10-CM

## 2011-04-01 DIAGNOSIS — F17200 Nicotine dependence, unspecified, uncomplicated: Secondary | ICD-10-CM

## 2011-04-01 DIAGNOSIS — D696 Thrombocytopenia, unspecified: Secondary | ICD-10-CM

## 2011-04-01 MED ORDER — TERAZOSIN HCL 1 MG PO CAPS: 1.0000 mg | ORAL_CAPSULE | Freq: Every day | ORAL | Status: DC

## 2011-04-01 MED ORDER — RANITIDINE HCL 150 MG PO TABS: 150.0000 mg | ORAL_TABLET | Freq: Two times a day (BID) | ORAL | Status: DC

## 2011-04-01 NOTE — Patient Instructions (Addendum)
Please meet with our social worker Ms. Dorothe Pea  Please meet with our financial coordinate Ms. Rudell Cobb  Return to clinic in 2 weeks, or any time sooner once you have met with Ms. Loleta Chance.    For smoking cessation, we also recommend you contact 1-800-QUITNOW

## 2011-04-01 NOTE — Assessment & Plan Note (Signed)
Erectile dysfunction has not improved as alcohol use has decreased.  In setting of continued alcohol use and other new medications, we will hold off on discussing treatment options for this at this time.    Plan: Emphasized importance of alcohol cessation

## 2011-04-01 NOTE — Progress Notes (Signed)
Subjective:    Patient ID: Brandon [name], male    DOB: [birth year], 45 y.o.   MRN: [id number]  HPI Brandon [name] is a 45 y/o gentleman with a history of heavy alcohol use (complicated by multiple episodes of pancreatitis, hepatic steatosis, and at least one withdrawal seizure) who presents for follow-up.  His alcohol use has decreased to 2-3 beers per day, and he has not had any acute health problems since his last visit.  He does have epigastric pain after eating certain foods, such as meats.  Taking Nexium beforehand helps significantly.  He has been to rehab three times for his alcohol use in the past, most recently in 2002, and has gone to Alcoholics Anonymous, most recently two years ago.  He is interested in quitting but is not sure he will succeed.    He continues to smoke 1ppd.  He is interested in quitting but cannot afford nicotine patches.    He continues to experience urinary symptoms.  He has severely decreased flow, describing this as dribbling.  He also experiences post-void dribbling.  No dysuria, hematuria, or urgency.  No change in frequency.  His erectile dysfunction continues unimproved as well.  He does have a history of BPH in his father and prostate cancer in his grandfather (age 40s).    He has not worked in a couple years, and sitting around all day does contribute to his depressed mood.  However, he does enjoy watching TV and denies any self-harm thoughts.  Decreased energy, good appetite.  He has been taking Ibuprofen for headaches occasionally.     Review of Systems  Review of Systems - History obtained from the patient General ROS: negative for - night sweats or weight loss Respiratory ROS: negative for - shortness of breath Cardiovascular ROS: negative for - chest pain or shortness of breath Gastrointestinal ROS: positive for - abdominal pain and heartburn negative for - appetite loss, blood in stools, change in bowel habits, change in stools, hematemesis, melena  or nausea/vomiting Genito-Urinary ROS: See HPI Neurological ROS: negative for - dizziness, gait disturbance or numbness/tingling    Objective:   Physical Exam General: alert, well-developed, and cooperative to examination.  Head: normocephalic and atraumatic.  Eyes: vision grossly intact, pupils equal, pupils round, pupils reactive to light, no injection and anicteric.  Mouth: pharynx pink and moist, no erythema, and no exudates.  Neck: supple, full ROM, no thyromegaly, no JVD, and no carotid bruits.  Lungs: normal respiratory effort, no accessory muscle use, normal breath sounds, no crackles, and no wheezes. Heart: normal rate, regular rhythm, no murmur, no gallop, and no rub.  Abdomen: soft, non-tender, normal bowel sounds, no distention, no guarding, no rebound tenderness, no hepatomegaly, and no splenomegaly.  Msk: no joint swelling, no joint warmth, and no redness over joints.  Pulses: 2+ DP/PT pulses bilaterally Extremities: No cyanosis, clubbing, edema Neurologic: alert & oriented X3, cranial nerves II-XII intact, strength normal in all extremities, sensation intact to light touch, and gait normal.  Skin: turgor normal and no rashes.  Psych: Oriented X3, memory intact for recent and remote, normally interactive, good eye contact, not anxious appearing, and not depressed appearing.       Assessment & Plan:

## 2011-04-01 NOTE — Assessment & Plan Note (Signed)
Check CBC at next appt once Mr. Toren has orange card

## 2011-04-01 NOTE — Assessment & Plan Note (Signed)
In the setting of heavy alcohol use.    Plan: -Emphasized importance of alcohol cessation -CMET at follow-up once Mr. Treece has an orange card to monitor

## 2011-04-01 NOTE — Assessment & Plan Note (Signed)
Weak stream and postvoid dribbling/retention, possible from BPH although that would not be common at his age.  He wishes to try medication for this at this time.  Plan: -Start terazosin 1mg  PO QHS.  This is on the [facility] $4 plan. -PSA check once Brandon [name] has orange card in setting of family history of early prostate cancer.

## 2011-04-01 NOTE — Assessment & Plan Note (Signed)
Pressure good today.  Home pressures have been (120s)/(70s-80s), so in good control without any medication.

## 2011-04-01 NOTE — Assessment & Plan Note (Signed)
Constipation has improved, possibly as a result of less alcohol use.  Brandon [name] rarely needs to use Miralax, but will will continue this PRN for the time being.

## 2011-04-01 NOTE — Assessment & Plan Note (Addendum)
Epigastric pain consistently comes after eating meat and certain other foods.  Nexium has been effective but is too costly.    Plan: -Emphasized importance of alcohol cessation -Ranitidine 150mg  PO BID instead of Nexium as ranitidine is on the [facility] $4 per month plan -Discontinue Ibuprofen use for headaches.  Recommended Tylenol OTC instead.

## 2011-04-01 NOTE — Assessment & Plan Note (Addendum)
Alcohol use down to 2-3 beers per day.    Plan: -Strongly emphasized importance of quitting alcohol entirely in setting of past pancreatitis, past/current hepatic steatosis, and current gastritis. -Referral to social work to discuss quitting options. -CBC at follow-up once Brandon [name] has an orange card to monitor platelet level -Follow-up in 2 weeks once he meets with Ms. Hill and has orange card. -Discontinue Lorazepam use due to interaction (several pills left from a prescription from the distant past for anxiety, which he says is under good control without medication now)

## 2011-04-01 NOTE — Progress Notes (Deleted)
  Subjective:    Patient ID: Brandon [name], male    DOB: 1966-02-14, 45 y.o.   MRN: [id number]  HPI    Review of Systems     Objective:   Physical Exam        Assessment & Plan:

## 2011-04-02 NOTE — Progress Notes (Signed)
I saw, examined, and discussed the patient with Dr Yaakov Guthrie and agree with the note contained here. 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 Currently no income but does get food stamps. Has girlfriend or wife - not certain the status. Dr Yaakov Guthrie has referred pt to Lupita Leash T for assistance with cessation of ETOH and tobacco.

## 2011-04-17 ENCOUNTER — Ambulatory Visit: Payer: Self-pay | Admitting: Internal Medicine

## 2011-05-06 ENCOUNTER — Telehealth: Payer: Self-pay | Admitting: Licensed Clinical Social Worker

## 2011-05-09 NOTE — Telephone Encounter (Signed)
05/09/11  Phone still not accepting calls.

## 2011-05-13 ENCOUNTER — Encounter: Payer: Self-pay | Admitting: Licensed Clinical Social Worker

## 2011-06-18 ENCOUNTER — Other Ambulatory Visit: Payer: Self-pay | Admitting: *Deleted

## 2011-06-18 DIAGNOSIS — K219 Gastro-esophageal reflux disease without esophagitis: Secondary | ICD-10-CM

## 2011-06-18 MED ORDER — RANITIDINE HCL 150 MG PO TABS: 150.0000 mg | ORAL_TABLET | Freq: Two times a day (BID) | ORAL | Status: DC

## 2011-12-27 ENCOUNTER — Other Ambulatory Visit: Payer: Self-pay | Admitting: Internal Medicine

## 2011-12-27 NOTE — Telephone Encounter (Signed)
Last seen July 2012. Needs F/U. Gave one month supply so he has time to get in. Can see any provider. He also needs to complete the orange card info ideally prior to the appt. He has a month to complete this

## 2012-01-01 ENCOUNTER — Other Ambulatory Visit: Payer: Self-pay | Admitting: *Deleted

## 2012-01-01 DIAGNOSIS — K219 Gastro-esophageal reflux disease without esophagitis: Secondary | ICD-10-CM

## 2012-01-01 MED ORDER — RANITIDINE HCL 150 MG PO TABS: 150.0000 mg | ORAL_TABLET | Freq: Two times a day (BID) | ORAL | Status: DC

## 2012-01-01 NOTE — Telephone Encounter (Signed)
Will give a 1 month supply.  Appropriateness of continued ranitidine therapy can be addressed at 5/6 appointment.

## 2012-01-01 NOTE — Telephone Encounter (Signed)
Last refill was only for 15 days. Appt has been scheduled 01/27/12 with Dr Yaakov Guthrie. Thanks

## 2012-01-15 NOTE — Telephone Encounter (Signed)
Unable to reach pt by phone - C. Boone to mail appt to pt.

## 2012-01-27 ENCOUNTER — Encounter: Payer: Self-pay | Admitting: Internal Medicine

## 2012-02-10 ENCOUNTER — Ambulatory Visit (INDEPENDENT_AMBULATORY_CARE_PROVIDER_SITE_OTHER): Payer: Self-pay | Admitting: Internal Medicine

## 2012-02-10 ENCOUNTER — Encounter: Payer: Self-pay | Admitting: Internal Medicine

## 2012-02-10 VITALS — BP 171/108 | HR 103 | Temp 97.1°F | Ht 73.0 in | Wt 201.6 lb

## 2012-02-10 DIAGNOSIS — R3919 Other difficulties with micturition: Secondary | ICD-10-CM

## 2012-02-10 DIAGNOSIS — K219 Gastro-esophageal reflux disease without esophagitis: Secondary | ICD-10-CM

## 2012-02-10 DIAGNOSIS — K760 Fatty (change of) liver, not elsewhere classified: Secondary | ICD-10-CM

## 2012-02-10 DIAGNOSIS — F1020 Alcohol dependence, uncomplicated: Secondary | ICD-10-CM

## 2012-02-10 DIAGNOSIS — D696 Thrombocytopenia, unspecified: Secondary | ICD-10-CM

## 2012-02-10 DIAGNOSIS — I10 Essential (primary) hypertension: Secondary | ICD-10-CM

## 2012-02-10 DIAGNOSIS — R3989 Other symptoms and signs involving the genitourinary system: Secondary | ICD-10-CM

## 2012-02-10 MED ORDER — FAMOTIDINE 20 MG PO TABS: 20.0000 mg | ORAL_TABLET | Freq: Two times a day (BID) | ORAL | Status: DC

## 2012-02-12 ENCOUNTER — Other Ambulatory Visit (INDEPENDENT_AMBULATORY_CARE_PROVIDER_SITE_OTHER): Payer: Self-pay

## 2012-02-12 DIAGNOSIS — N399 Disorder of urinary system, unspecified: Secondary | ICD-10-CM

## 2012-02-12 DIAGNOSIS — K7689 Other specified diseases of liver: Secondary | ICD-10-CM

## 2012-02-12 DIAGNOSIS — D696 Thrombocytopenia, unspecified: Secondary | ICD-10-CM

## 2012-02-12 LAB — COMPLETE METABOLIC PANEL WITH GFR
ALT: 57 U/L — ABNORMAL HIGH (ref 0–53)
Alkaline Phosphatase: 90 U/L (ref 39–117)
CO2: 21 mEq/L (ref 19–32)
Creat: 0.94 mg/dL (ref 0.50–1.35)
GFR, Est African American: >89 mL/min
GFR, Est Non African American: >89 mL/min
Total Bilirubin: 0.9 mg/dL (ref 0.3–1.2)

## 2012-02-13 LAB — CBC
MCH: 33.9 pg (ref 26.0–34.0)
MCHC: 34 g/dL (ref 30.0–36.0)
MCV: 99.8 fL (ref 78.0–100.0)
Platelets: 194 10*3/uL (ref 150–400)
RBC: 4.16 MIL/uL — ABNORMAL LOW (ref 4.22–5.81)
RDW: 15.3 % (ref 11.5–15.5)

## 2012-02-22 NOTE — Assessment & Plan Note (Signed)
Has had some episodes in past months of nausea and vomiting that come in waves and last for days.  Suspect gastritis vs pancreatitis from his alcohol use.  Not happening currently.   Re-iterated importance of alcohol cessation.  Changed Zantac to Pepcid (to not interfere with ethanol metabolism).

## 2012-02-22 NOTE — Assessment & Plan Note (Signed)
Mildly elevated transaminases today similar to baseline, likely due to alcohol.  Discussed this with patient and risk of future cirrhosis.

## 2012-02-22 NOTE — Assessment & Plan Note (Signed)
Platelets look good today

## 2012-02-22 NOTE — Progress Notes (Signed)
Subjective:   Patient ID: Brandon [name] male   DOB: July 20, 1966 46 y.o.   MRN: [id number]  HPI: [name] is a 46 y.o. with history of alcohol abuse here for fu.  He reports no current problems.  He reports no drinking since yesterday evening.  No pain anywhere.      Past Medical History  Diagnosis Date  . Alcohol abuse   . Pancreatitis     CT findings in May 2011 with inflammation and pseudocyst  . Allergy   . Pancreatitis   . GERD (gastroesophageal reflux disease)    Current Outpatient Prescriptions  Medication Sig Dispense Refill  . aspirin 81 MG tablet Take 81 mg by mouth daily.        . famotidine (PEPCID) 20 MG tablet Take 1 tablet (20 mg total) by mouth 2 (two) times daily.  60 tablet  6  . folic acid (FOLVITE) 1 MG tablet Take 1 mg by mouth daily.        . Multiple Vitamin (MULTIVITAMIN) capsule Take 1 capsule by mouth daily.        . polyethylene glycol powder (MIRALAX) powder Take 17 g by mouth daily as needed.  255 g    . terazosin (HYTRIN) 1 MG capsule Take 1 capsule (1 mg total) by mouth at bedtime.  30 capsule  5  . traZODone (DESYREL) 150 MG tablet Take 300 mg by mouth at bedtime.         Family History  Problem Relation Age of Onset  . Stroke Mother     deceased  . Coronary artery disease Mother   . Alcohol abuse Mother   . Cancer Mother   . Hypertension Father     alive  . Alcohol abuse Father   . Diabetes Father   . Kidney disease Father    History   Social History  . Marital Status: Single    Spouse Name: [name]    Number of Children: 0  . Years of Education: N/A   Occupational History  . unemployed   .  [employer]   Social History Main Topics  . Smoking status: Current Everyday Smoker -- 1.0 packs/day for 27 years    Types: Cigarettes  . Smokeless tobacco: Not on file   Comment: Patient states he cannot afford nicotine patches at this time  . Alcohol Use: 50.4 oz/week    84 Cans of beer per week     Pt is trying to quit  .  Drug Use: No     very remote history of marijuana  . Sexually Active: Yes    Birth Control/ Protection: None   Other Topics Concern  . 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  Respiratory: Denies SOB, DOE, cough, chest tightness,  and wheezing.   Cardiovascular: Denies chest pain, palpitations and leg swelling.  Gastrointestinal: Denies current nausea, current vomiting, abdominal pain, diarrhea, constipation, blood in stool and abdominal distention.  Genitourinary: Denies dysuria, urgency, frequency, hematuria, flank pain and difficulty urinating.  Skin: Denies pallor, rash and wound.  Neurological: Denies dizziness, seizures, syncope, weakness, light-headedness, numbness and headaches.   Objective:  Physical Exam: Filed Vitals:   02/10/12 1531  BP: 171/108  Pulse: 103  Temp: 97.1 F (36.2 C)  TempSrc: Oral  Height: 6\' 1"  (1.854 m)  Weight: 201 lb 9.6 oz (91.445 kg)   Constitutional: Vital signs reviewed.  Patient is a well-developed and well-nourished man in no acute distress and cooperative with exam. Alert and oriented x3.  He has mild action tremor today.   Head: Normocephalic and atraumatic Mouth: no erythema or exudates, MMM Eyes: PERRL, EOMI, conjunctivae normal, No scleral icterus.  Neck: No JVD  Cardiovascular: RRR, S1 normal, S2 normal, no MRG, pulses symmetric and intact bilaterally Pulmonary/Chest: CTAB, no wheezes, rales, or rhonchi Abdominal: Soft. Non-tender, non-distended, bowel sounds are normal, no masses, organomegaly, or guarding present.     Neurological: A&O x3, Strength is normal and symmetric bilaterally, cranial nerve II-XII are grossly intact, no focal motor deficit, sensory intact to light touch bilaterally.  Skin:  Warm, dry and intact. No rash, cyanosis, or clubbing.   Assessment & Plan:

## 2012-02-22 NOTE — Assessment & Plan Note (Signed)
Drinks twelve 12 oz beers per day.  No drink since yesterday, and he is tremulous this afternoon which he says is typical when he has not drank for a day.  I counseled him on the dangers of his alcoholism in terms of GI bleed, dementia, cirrhosis, cancers.  He explained that he is going to try to cut back with the ultimate goal of cessation, but he is not ready to stop cold [place] right now.

## 2012-02-22 NOTE — Assessment & Plan Note (Signed)
BP 170s/100s on multiple checks by me in addition to intake BP.  Patient reports no drink of alcohol since yesterday and is somewhat tremulous.  Suspect that mild alcohol withdrawal is causing his increased BP or alcohol use itself.  As per below strongly encouraged cessation. No BP treatment since this may just be temporary from not drinking yesterday.  Recheck BP on follow-up.

## 2012-02-22 NOTE — Assessment & Plan Note (Addendum)
Doing much better.  Terazosin he took 1 bottle's worth after visit a while back but it did not help much.  Fam hx of early prostate cancer so will check PSA, which came back non-elevated.

## 2012-02-24 ENCOUNTER — Encounter: Payer: Self-pay | Admitting: Internal Medicine

## 2012-02-25 ENCOUNTER — Encounter: Payer: Self-pay | Admitting: Internal Medicine

## 2012-06-10 DIAGNOSIS — Z823 Family history of stroke: Secondary | ICD-10-CM

## 2012-06-10 DIAGNOSIS — G8190 Hemiplegia, unspecified affecting unspecified side: Secondary | ICD-10-CM | POA: Diagnosis present

## 2012-06-10 DIAGNOSIS — E876 Hypokalemia: Secondary | ICD-10-CM | POA: Diagnosis present

## 2012-06-10 DIAGNOSIS — I63239 Cerebral infarction due to unspecified occlusion or stenosis of unspecified carotid arteries: Principal | ICD-10-CM | POA: Diagnosis present

## 2012-06-10 DIAGNOSIS — R7309 Other abnormal glucose: Secondary | ICD-10-CM | POA: Diagnosis present

## 2012-06-10 DIAGNOSIS — R9389 Abnormal findings on diagnostic imaging of other specified body structures: Secondary | ICD-10-CM | POA: Diagnosis present

## 2012-06-10 DIAGNOSIS — J9819 Other pulmonary collapse: Secondary | ICD-10-CM | POA: Diagnosis present

## 2012-06-10 DIAGNOSIS — Y9210 Unspecified residential institution as the place of occurrence of the external cause: Secondary | ICD-10-CM | POA: Diagnosis not present

## 2012-06-10 DIAGNOSIS — F17200 Nicotine dependence, unspecified, uncomplicated: Secondary | ICD-10-CM | POA: Diagnosis present

## 2012-06-10 DIAGNOSIS — I517 Cardiomegaly: Secondary | ICD-10-CM | POA: Diagnosis present

## 2012-06-10 DIAGNOSIS — F10229 Alcohol dependence with intoxication, unspecified: Secondary | ICD-10-CM | POA: Diagnosis present

## 2012-06-10 DIAGNOSIS — I2510 Atherosclerotic heart disease of native coronary artery without angina pectoris: Secondary | ICD-10-CM | POA: Diagnosis present

## 2012-06-10 DIAGNOSIS — R1110 Vomiting, unspecified: Secondary | ICD-10-CM | POA: Diagnosis not present

## 2012-06-10 DIAGNOSIS — R4182 Altered mental status, unspecified: Secondary | ICD-10-CM | POA: Diagnosis present

## 2012-06-10 DIAGNOSIS — K861 Other chronic pancreatitis: Secondary | ICD-10-CM | POA: Diagnosis present

## 2012-06-10 DIAGNOSIS — R29810 Facial weakness: Secondary | ICD-10-CM | POA: Diagnosis present

## 2012-06-10 DIAGNOSIS — R4789 Other speech disturbances: Secondary | ICD-10-CM | POA: Diagnosis present

## 2012-06-10 DIAGNOSIS — Y849 Medical procedure, unspecified as the cause of abnormal reaction of the patient, or of later complication, without mention of misadventure at the time of the procedure: Secondary | ICD-10-CM | POA: Diagnosis not present

## 2012-06-10 DIAGNOSIS — T8579XA Infection and inflammatory reaction due to other internal prosthetic devices, implants and grafts, initial encounter: Secondary | ICD-10-CM | POA: Diagnosis not present

## 2012-06-10 DIAGNOSIS — R279 Unspecified lack of coordination: Secondary | ICD-10-CM | POA: Diagnosis present

## 2012-06-10 DIAGNOSIS — E785 Hyperlipidemia, unspecified: Secondary | ICD-10-CM | POA: Diagnosis present

## 2012-06-10 DIAGNOSIS — Z7982 Long term (current) use of aspirin: Secondary | ICD-10-CM

## 2012-06-10 DIAGNOSIS — I7771 Dissection of carotid artery: Secondary | ICD-10-CM | POA: Diagnosis present

## 2012-06-10 DIAGNOSIS — K560 Paralytic ileus: Secondary | ICD-10-CM | POA: Diagnosis not present

## 2012-06-10 DIAGNOSIS — I10 Essential (primary) hypertension: Secondary | ICD-10-CM | POA: Diagnosis present

## 2012-06-10 DIAGNOSIS — K219 Gastro-esophageal reflux disease without esophagitis: Secondary | ICD-10-CM | POA: Diagnosis present

## 2012-06-10 DIAGNOSIS — R471 Dysarthria and anarthria: Secondary | ICD-10-CM | POA: Diagnosis present

## 2012-06-11 ENCOUNTER — Emergency Department (HOSPITAL_COMMUNITY): Payer: Medicaid Other

## 2012-06-11 ENCOUNTER — Inpatient Hospital Stay (HOSPITAL_COMMUNITY): Payer: Medicaid Other

## 2012-06-11 ENCOUNTER — Inpatient Hospital Stay (HOSPITAL_COMMUNITY)
Admission: EM | Admit: 2012-06-11 | Discharge: 2012-06-18 | DRG: 061 | Disposition: A | Discharge status: Home / Self Care | Payer: Medicaid Other | Attending: Neurology | Admitting: Neurology

## 2012-06-11 ENCOUNTER — Encounter (HOSPITAL_COMMUNITY): Payer: Self-pay | Admitting: *Deleted

## 2012-06-11 DIAGNOSIS — I6523 Occlusion and stenosis of bilateral carotid arteries: Secondary | ICD-10-CM | POA: Diagnosis present

## 2012-06-11 DIAGNOSIS — I639 Cerebral infarction, unspecified: Secondary | ICD-10-CM | POA: Diagnosis present

## 2012-06-11 DIAGNOSIS — F10239 Alcohol dependence with withdrawal, unspecified: Secondary | ICD-10-CM | POA: Diagnosis present

## 2012-06-11 DIAGNOSIS — F1020 Alcohol dependence, uncomplicated: Secondary | ICD-10-CM

## 2012-06-11 DIAGNOSIS — F1010 Alcohol abuse, uncomplicated: Secondary | ICD-10-CM | POA: Diagnosis present

## 2012-06-11 DIAGNOSIS — R1110 Vomiting, unspecified: Secondary | ICD-10-CM | POA: Diagnosis present

## 2012-06-11 DIAGNOSIS — IMO0001 Reserved for inherently not codable concepts without codable children: Secondary | ICD-10-CM | POA: Diagnosis present

## 2012-06-11 DIAGNOSIS — K5900 Constipation, unspecified: Secondary | ICD-10-CM | POA: Diagnosis present

## 2012-06-11 DIAGNOSIS — F10939 Alcohol use, unspecified with withdrawal, unspecified: Secondary | ICD-10-CM | POA: Diagnosis present

## 2012-06-11 DIAGNOSIS — K859 Acute pancreatitis without necrosis or infection, unspecified: Secondary | ICD-10-CM | POA: Diagnosis present

## 2012-06-11 DIAGNOSIS — E785 Hyperlipidemia, unspecified: Secondary | ICD-10-CM | POA: Diagnosis present

## 2012-06-11 DIAGNOSIS — F17200 Nicotine dependence, unspecified, uncomplicated: Secondary | ICD-10-CM

## 2012-06-11 DIAGNOSIS — K567 Ileus, unspecified: Secondary | ICD-10-CM | POA: Diagnosis not present

## 2012-06-11 DIAGNOSIS — I6789 Other cerebrovascular disease: Secondary | ICD-10-CM

## 2012-06-11 DIAGNOSIS — I6529 Occlusion and stenosis of unspecified carotid artery: Secondary | ICD-10-CM

## 2012-06-11 DIAGNOSIS — I2510 Atherosclerotic heart disease of native coronary artery without angina pectoris: Secondary | ICD-10-CM | POA: Diagnosis present

## 2012-06-11 DIAGNOSIS — K219 Gastro-esophageal reflux disease without esophagitis: Secondary | ICD-10-CM

## 2012-06-11 DIAGNOSIS — I6350 Cerebral infarction due to unspecified occlusion or stenosis of unspecified cerebral artery: Secondary | ICD-10-CM

## 2012-06-11 DIAGNOSIS — I10 Essential (primary) hypertension: Secondary | ICD-10-CM | POA: Diagnosis present

## 2012-06-11 HISTORY — DX: Atherosclerotic heart disease of native coronary artery without angina pectoris: I25.10

## 2012-06-11 HISTORY — DX: Cerebral infarction, unspecified: I63.9

## 2012-06-11 HISTORY — DX: Hyperlipidemia, unspecified: E78.5

## 2012-06-11 LAB — COMPREHENSIVE METABOLIC PANEL
ALT: 34 U/L (ref 0–53)
CO2: 24 mEq/L (ref 19–32)
Calcium: 9.6 mg/dL (ref 8.4–10.5)
Creatinine, Ser: 0.68 mg/dL (ref 0.50–1.35)
GFR calc Af Amer: >90 mL/min (ref >90)
GFR calc non Af Amer: >90 mL/min (ref >90)
Glucose, Bld: 134 mg/dL — ABNORMAL HIGH (ref 70–99)
Sodium: 140 mEq/L (ref 135–145)
Total Protein: 7.4 g/dL (ref 6.0–8.3)

## 2012-06-11 LAB — POCT I-STAT, CHEM 8
Creatinine, Ser: 1 mg/dL (ref 0.50–1.35)
HCT: 44 % (ref 39.0–52.0)
Hemoglobin: 15 g/dL (ref 13.0–17.0)
Potassium: 3.4 mEq/L — ABNORMAL LOW (ref 3.5–5.1)
Sodium: 141 mEq/L (ref 135–145)
TCO2: 21 mmol/L (ref 0–100)

## 2012-06-11 LAB — CBC
HCT: 43.1 % (ref 39.0–52.0)
MCH: 35.1 pg — ABNORMAL HIGH (ref 26.0–34.0)
MCV: 101.7 fL — ABNORMAL HIGH (ref 78.0–100.0)
Platelets: 220 10*3/uL (ref 150–400)
RDW: 13.7 % (ref 11.5–15.5)

## 2012-06-11 LAB — HEMOGLOBIN A1C: Mean Plasma Glucose: 120 mg/dL — ABNORMAL HIGH (ref <117)

## 2012-06-11 LAB — DIFFERENTIAL
Basophils Absolute: 0.1 10*3/uL (ref 0.0–0.1)
Eosinophils Absolute: 0.1 10*3/uL (ref 0.0–0.7)
Eosinophils Relative: 1 % (ref 0–5)
Lymphs Abs: 2.8 10*3/uL (ref 0.7–4.0)
Monocytes Absolute: 0.8 10*3/uL (ref 0.1–1.0)

## 2012-06-11 LAB — LIPID PANEL: Cholesterol: 207 mg/dL — ABNORMAL HIGH (ref 0–200)

## 2012-06-11 LAB — RAPID URINE DRUG SCREEN, HOSP PERFORMED
Amphetamines: NOT DETECTED
Benzodiazepines: NOT DETECTED
Cocaine: NOT DETECTED

## 2012-06-11 LAB — PROTIME-INR: Prothrombin Time: 13 seconds (ref 11.6–15.2)

## 2012-06-11 LAB — MRSA PCR SCREENING: MRSA by PCR: NEGATIVE

## 2012-06-11 LAB — ETHANOL: Alcohol, Ethyl (B): 199 mg/dL — ABNORMAL HIGH (ref 0–11)

## 2012-06-11 MED ORDER — LORAZEPAM 2 MG/ML IJ SOLN
1.0000 mg | INTRAMUSCULAR | Status: DC | PRN
  Administered 2012-06-11 – 2012-06-12 (×3): 2 mg via INTRAVENOUS
  Administered 2012-06-12 (×2): 1 mg via INTRAVENOUS
  Filled 2012-06-11 (×7): qty 1

## 2012-06-11 MED ORDER — ALTEPLASE (STROKE) FULL DOSE INFUSION
0.9000 mg/kg | Freq: Once | INTRAVENOUS | Status: AC
  Administered 2012-06-11 (×2): 82 mg via INTRAVENOUS
  Filled 2012-06-11: qty 82

## 2012-06-11 MED ORDER — SODIUM CHLORIDE 0.9 % IV SOLN: INTRAVENOUS | Status: DC

## 2012-06-11 MED ORDER — ACETAMINOPHEN 325 MG PO TABS: 650.0000 mg | ORAL_TABLET | ORAL | Status: DC | PRN

## 2012-06-11 MED ORDER — VITAMIN B-1 100 MG PO TABS
100.0000 mg | ORAL_TABLET | Freq: Every day | ORAL | Status: DC
  Administered 2012-06-11 – 2012-06-18 (×8): 100 mg via ORAL
  Filled 2012-06-11 (×8): qty 1

## 2012-06-11 MED ORDER — ONDANSETRON HCL 4 MG/2ML IJ SOLN: 4.0000 mg | Freq: Four times a day (QID) | INTRAMUSCULAR | Status: DC | PRN

## 2012-06-11 MED ORDER — ACETAMINOPHEN 325 MG PO TABS
650.0000 mg | ORAL_TABLET | ORAL | Status: DC | PRN
  Administered 2012-06-11: 650 mg via ORAL
  Filled 2012-06-11: qty 2

## 2012-06-11 MED ORDER — FOLIC ACID 1 MG PO TABS
1.0000 mg | ORAL_TABLET | Freq: Every day | ORAL | Status: DC
  Administered 2012-06-11 – 2012-06-18 (×8): 1 mg via ORAL
  Filled 2012-06-11 (×8): qty 1

## 2012-06-11 MED ORDER — ATORVASTATIN CALCIUM 10 MG PO TABS
10.0000 mg | ORAL_TABLET | Freq: Every day | ORAL | Status: DC
  Administered 2012-06-11 – 2012-06-12 (×2): 10 mg via ORAL
  Filled 2012-06-11 (×3): qty 1

## 2012-06-11 MED ORDER — ACETAMINOPHEN 650 MG RE SUPP: 650.0000 mg | RECTAL | Status: DC | PRN

## 2012-06-11 MED ORDER — PANTOPRAZOLE SODIUM 40 MG PO TBEC
40.0000 mg | DELAYED_RELEASE_TABLET | Freq: Every day | ORAL | Status: DC
  Administered 2012-06-11 – 2012-06-17 (×7): 40 mg via ORAL
  Filled 2012-06-11 (×7): qty 1

## 2012-06-11 MED ORDER — PANTOPRAZOLE SODIUM 40 MG IV SOLR
40.0000 mg | Freq: Every day | INTRAVENOUS | Status: DC
  Administered 2012-06-11: 40 mg via INTRAVENOUS
  Filled 2012-06-11 (×2): qty 40

## 2012-06-11 MED ORDER — SODIUM CHLORIDE 0.9 % IV SOLN
INTRAVENOUS | Status: DC
  Administered 2012-06-11 – 2012-06-12 (×2): via INTRAVENOUS

## 2012-06-11 MED ORDER — ADULT MULTIVITAMIN W/MINERALS CH
1.0000 | ORAL_TABLET | Freq: Every day | ORAL | Status: DC
  Administered 2012-06-11 – 2012-06-18 (×8): 1 via ORAL
  Filled 2012-06-11 (×8): qty 1

## 2012-06-11 MED ORDER — LORAZEPAM 1 MG PO TABS
1.0000 mg | ORAL_TABLET | Freq: Four times a day (QID) | ORAL | Status: DC | PRN
  Administered 2012-06-11: 1 mg via ORAL
  Filled 2012-06-11: qty 1

## 2012-06-11 MED ORDER — LABETALOL HCL 5 MG/ML IV SOLN
10.0000 mg | INTRAVENOUS | Status: DC | PRN
  Administered 2012-06-11 – 2012-06-17 (×13): 10 mg via INTRAVENOUS
  Filled 2012-06-11 (×11): qty 4

## 2012-06-11 MED ORDER — ONDANSETRON HCL 4 MG/2ML IJ SOLN
4.0000 mg | Freq: Four times a day (QID) | INTRAMUSCULAR | Status: DC | PRN
  Administered 2012-06-11: 4 mg via INTRAVENOUS
  Filled 2012-06-11: qty 2

## 2012-06-11 MED ORDER — LORAZEPAM 2 MG/ML IJ SOLN: 1.0000 mg | Freq: Four times a day (QID) | INTRAMUSCULAR | Status: DC | PRN

## 2012-06-11 MED ORDER — LORAZEPAM 1 MG PO TABS
1.0000 mg | ORAL_TABLET | Freq: Once | ORAL | Status: AC
  Administered 2012-06-11: 1 mg via ORAL
  Filled 2012-06-11: qty 1

## 2012-06-11 MED ORDER — NICOTINE 21 MG/24HR TD PT24
21.0000 mg | MEDICATED_PATCH | Freq: Every day | TRANSDERMAL | Status: DC
  Administered 2012-06-11 – 2012-06-18 (×8): 21 mg via TRANSDERMAL
  Filled 2012-06-11 (×11): qty 1

## 2012-06-11 MED ORDER — THIAMINE HCL 100 MG/ML IJ SOLN
100.0000 mg | Freq: Every day | INTRAMUSCULAR | Status: DC
  Filled 2012-06-11: qty 1

## 2012-06-11 MED ORDER — LABETALOL HCL 5 MG/ML IV SOLN: 10.0000 mg | INTRAVENOUS | Status: DC | PRN

## 2012-06-11 NOTE — ED Notes (Signed)
Pt presents to ED with wife, c/o slurred speech since 2300 yesterday.  Wife also states he had problems with comprehension.  Grip strength equal, left facial droop.

## 2012-06-11 NOTE — Consult Note (Signed)
Name: Brandon [name] MRN: [id number] DOB: 1965-12-30    LOS: 0  Referring Provider:  Pearlean Brownie Reason for Referral:  Alcohol abuse  PULMONARY / CRITICAL CARE MEDICINE  HPI:  46 yo male smoker admitted 06/11/2012 with slurred speech, and left sided numbness likely from acute CVA, and was given tPA.  He reports hx of heavy alcohol use (7-10 beers per day).  PCCM consulted 9/19 to assist with management with hx of ETOH.  He reports his last drink of alcohol was on night of admission.  He has history of chronic abdominal pain from pancreatitis related to ETOH.  He had a seizure in 1990's related to alcohol use.  He reports that he can get the shakes if he doesn't drink, but this is never a problem because he always drinks.  Past Medical History  Diagnosis Date  . Alcohol abuse   . Pancreatitis     CT findings in May 2011 with inflammation and pseudocyst  . Allergy   . Pancreatitis   . GERD (gastroesophageal reflux disease)    History reviewed. No pertinent past surgical history. Prior to Admission medications   Medication Sig Start Date End Date Taking? Authorizing Provider  aspirin 81 MG tablet Take 81 mg by mouth daily.     Yes Historical Provider, MD  famotidine (PEPCID) 20 MG tablet Take 1 tablet (20 mg total) by mouth 2 (two) times daily. 02/10/12 02/09/13 Yes Danley Danker, MD  folic acid (FOLVITE) 1 MG tablet Take 1 mg by mouth daily.     Yes Historical Provider, MD  Multiple Vitamin (MULTIVITAMIN) capsule Take 1 capsule by mouth daily.     Yes Historical Provider, MD  traZODone (DESYREL) 150 MG tablet Take 300 mg by mouth at bedtime.     Yes Historical Provider, MD   Allergies Allergies  Allergen Reactions  . Other Swelling    Zennioptical - Delirium    Family History Family History  Problem Relation Age of Onset  . Stroke Mother     deceased  . Coronary artery disease Mother   . Alcohol abuse Mother   . Cancer Mother   . Hypertension Father     alive  . Alcohol  abuse Father   . Diabetes Father   . Kidney disease Father    Social History  reports that he has been smoking Cigarettes.  He has a 27 pack-year smoking history. He does not have any smokeless tobacco history on file. He reports that he drinks about 50.4 ounces of alcohol per week. He reports that he does not use illicit drugs.  Review Of Systems:  Denies headache, blurred vision, chest pain, dyspnea, nausea, leg swelling. C/o abdominal discomfort, but this is chronic.  C/o of discomfort from foley catheter.  Events Since Admission: 9/19 Admitted CVA s/p tPA  Current Status: Improved speech and strength after tPA  Vital Signs: Temp:  [97.9 F (36.6 C)-98.1 F (36.7 C)] 97.9 F (36.6 C) (09/19 0700) Pulse Rate:  [66-85] 66  (09/19 0900) Resp:  [12-23] 23  (09/19 0700) BP: (133-197)/(67-125) 185/125 mmHg (09/19 0900) SpO2:  [94 %-100 %] 98 % (09/19 0700) Weight:  [197 lb 8.5 oz (89.6 kg)-200 lb 9.9 oz (91 kg)] 197 lb 8.5 oz (89.6 kg) (09/19 0145)  Physical Examination: General - no distress HEENT - no sinus tenderness, no oral exudate, no LAN Cardiac - s1s2 regular, no murmur Chest - no wheeze/rales Abd - soft, mild mid-epigastric tenderness, no organomegaly, +bowel sounds Ext -  no edema Neuro - normal strength, speech fluent Psych - normal mood, behavior  Ct Head Wo Contrast  06/11/2012  *RADIOLOGY REPORT*  Clinical Data: Left side weakness.  Slurred speech.  CT HEAD WITHOUT CONTRAST  Technique:  Contiguous axial images were obtained from the base of the skull through the vertex without contrast.  Comparison: None.  Findings: There is cortical atrophy with compensatory prominence of the ventricular system.  No evidence of acute abnormality including infarction, hemorrhage, mass lesion, mass effect, midline shift or abnormal extra-axial fluid collection.  No pneumocephalus.  There is mild mucosal thickening in the left maxillary sinus.  IMPRESSION: No acute finding.  Age  advanced atrophy.   Original Report Authenticated By: Bernadene Bell. Maricela Curet, M.D.    Dg Chest Port 1 View  06/11/2012  *RADIOLOGY REPORT*  Clinical Data: History is TPA, follow-up  PORTABLE CHEST - 1 VIEW  Comparison: CT angio chest of 02/20/2011 and chest x-ray of the same date  Findings: The lungs are not as well aerated.  Mild basilar atelectasis is present.  No focal infiltrate or effusion is seen. Cardiomegaly is stable.  IMPRESSION: Mild basilar atelectasis.  Stable mild cardiomegaly.   Original Report Authenticated By: Juline Patch, M.D.     ASSESSMENT AND PLAN  PULMONARY  A: Hx of tobacco abuse P:   Smoking cessation Oxygen as needed to keep SpO2 > 92% Nicotine patch  CARDIOVASCULAR  Lab 06/11/12 0017  TROPONINI <0.30  LATICACIDVEN --  PROBNP --   Lines: Peripheral IV  A: Hypertension P:  PRN labetalol per stroke team; goal SBP < 180 and DBP < 105  RENAL  Lab 06/11/12 0046 06/11/12 0017  NA 141 140  K 3.4* 3.6  CL 103 100  CO2 -- 24  BUN 5* 7  CREATININE 1.00 0.68  CALCIUM -- 9.6  MG -- --  PHOS -- --   Intake/Output      09/18 0701 - 09/19 0700 09/19 0701 - 09/20 0700   I.V. (mL/kg) 398 (4.4)    IV Piggyback 12    Total Intake(mL/kg) 410 (4.6)    Urine (mL/kg/hr) 1250 (0.6)    Emesis/NG output 1    Total Output 1251    Net -841          Foley: 9/19>>  A: Hypokalemia P:   F/u and replace electrolytes as needed  GASTROINTESTINAL  Lab 06/11/12 0017  AST 49*  ALT 34  ALKPHOS 93  BILITOT 0.1*  PROT 7.4  ALBUMIN 3.9    A: Nutrition. 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 P:   Thiamine, folic acid Heart healthy diet  HEMATOLOGIC  Lab 06/11/12 0046 06/11/12 0017  HGB 15.0 14.9  HCT 44.0 43.1  PLT -- 220  INR -- 0.99  APTT -- 32   A: No active issues P:  F/u CBC  INFECTIOUS  Lab 06/11/12 0017  WBC 8.3  PROCALCITON --   Cultures:  Antibiotics:   A:  No evidence for infection. P:   Monitor clinically  ENDOCRINE  A: No  active issues P:   Monitor blood sugars on BMET  NEUROLOGIC  A: CVA s/p tPA. Alcohol abuse. P:   CIWA protocol Stroke evaluation per neurology  BEST PRACTICE / DISPOSITION Level of Care:  ICU Primary Service:  Stroke Consultants:  PCCM Code Status: Full Diet:  Heart healthy DVT Px: SCD GI Px:  protonix   Pippa Hanif, M.D. Pulmonary and Critical Care Medicine [facility] Pager: [phone]  06/11/2012, 10:14  AM

## 2012-06-11 NOTE — ED Notes (Signed)
Pt to CT 1 with Meredeth Ide, RN.

## 2012-06-11 NOTE — Code Documentation (Signed)
Patient was at home with family watching tv, around 11pm patient developed a facial droop and slurred speech. Patient was brought into ED by private vehicle. Patient arrived at 0000, Code stroke called at 0006, EDp exam at 0005, stroke team arrived at 71, [place] at 2300, patient arrived in CT at 0009, phlebotomist arrived at 0010, CT read by Dr. Amada Jupiter at [place], pharmacy notified to mix tPA at 0025, tPA delivered at 0033, bed requested at [facility]. tPA started at 0035, paused at 0037, restarted at 0059. Baseline NIH 03 for left leg droop, decreased sensation on left leg, and slurred speech. Will continue to monitor, patient transported to 3108.

## 2012-06-11 NOTE — Evaluation (Signed)
Speech Language Pathology Evaluation Patient Details Name: Brandon [name] MRN: [id number] DOB: 1966/06/11 Today's Date: 06/11/2012 Time: 7829-5621 SLP Time Calculation (min): 17 min  Problem List:  Patient Active Problem List  Diagnosis  . Hypertension  . Alcoholism  . GERD (gastroesophageal reflux disease)  . Pancreatitis 2/2 alcoholism  . Insomnia  . Constipation  . Urine troubles  . Alcohol withdrawal seizure  . Erectile dysfunction  . Hepatic steatosis  . Thrombocytopenia   Past Medical History:  Past Medical History  Diagnosis Date  . Alcohol abuse   . Pancreatitis     CT findings in May 2011 with inflammation and pseudocyst  . Allergy   . Pancreatitis   . GERD (gastroesophageal reflux disease)    Past Surgical History: History reviewed. No pertinent past surgical history. HPI:  46 yo male smoker admitted 06/11/2012 with slurred speech, and left sided numbness likely from acute CVA, and was given tPA.  He reports hx of heavy alcohol use (7-10 beers per day)   Assessment / Plan / Recommendation Clinical Impression  Pt admitted with dysarthria, numbness LUE and LLE, ETOH abuse.  Presents with decreased clarity of thinking, difficulty with recall of verbal information, decreased verbal organization.  Overall with improvements after administration of tPa;  dysarthria has resolved.  Will f/u 9/20 to determine improvements in cognition.  Pt/family agree.    SLP Assessment  Patient needs continued Speech Lanaguage Pathology Services    Follow Up Recommendations  None    Frequency and Duration min 1 x/week  1 week   Pertinent Vitals/Pain none   SLP Goals  SLP Goals Potential to Achieve Goals: Good SLP Goal #1: Pt will utilize compensatory strategies to facilitate recall of new information with mod I. Eupha Lobb L. Samson Frederic, [place] CCC/SLP Pager [phone]   Blenda Mounts Laurice 06/11/2012, 12:30 PM

## 2012-06-11 NOTE — Progress Notes (Signed)
  Echocardiogram 2D Echocardiogram has been performed.  [place] 06/11/2012, 3:03 PM

## 2012-06-11 NOTE — Progress Notes (Signed)
VASCULAR LAB PRELIMINARY  PRELIMINARY  PRELIMINARY  PRELIMINARY  Carotid duplex completed.    Preliminary report:  Right - Heavily calcified ICA with diminished flow proximally. There is a pre occlusion "thumping" in the mid to distal region. Vertebral artery flow is antegrade.                                 Left - Moderate calcification of the ICA with no obvious evidence of significant stenosis. Acoustic shadowing noted in the proximal region which may obscure higher velocities. Vertebral artery flow is antegrade  [name], RVS 06/11/2012, 3:17 PM

## 2012-06-11 NOTE — ED Notes (Signed)
Patient transported from CT; neuro and EDP at bedside

## 2012-06-11 NOTE — H&P (Addendum)
Admission H&P    Chief Complaint: Slurred speech HPI: Brandon [name] is an 46 y.o. male with a history of smoking who was in his normal state at 10:45 as he watching TV with his wife. At 91, his wife noticed facial drooping and slurred speech and called 911.   Since that time, he has continued to have slurred speech, and has noticed that his left arm and leg are numb. He states that his arm has been going "in and out" on him.  He does not take any daily antiplatelet medicine. He states that he checked his blood pressure last week and it was 120 systolic.  He does have a history of alcohol abuse, consuming 7-10 beers per night, and tonight he feels that he has been on the low side, around 7.  LSN: 10:45 PM tPA Given: Yes  Past Medical History  Diagnosis Date  . Alcohol abuse   . Pancreatitis     CT findings in May 2011 with inflammation and pseudocyst  . Allergy   . Pancreatitis   . GERD (gastroesophageal reflux disease)     History reviewed. No pertinent past surgical history.  Family History  Problem Relation Age of Onset  . Stroke Mother     deceased  . Coronary artery disease Mother   . Alcohol abuse Mother   . Cancer Mother   . Hypertension Father     alive  . Alcohol abuse Father   . Diabetes Father   . Kidney disease Father    Social History:  reports that he has been smoking Cigarettes.  He has a 27 pack-year smoking history. He does not have any smokeless tobacco history on file. He reports that he drinks about 50.4 ounces of alcohol per week. He reports that he does not use illicit drugs.  Allergies: No Known Allergies  Medications: Trazodone for sleep Famotidine for heartburn   ROS: 11 point review of systems is performed and is negative except as noted in the history of present illness to  Physical Examination: Blood pressure 146/94, pulse 77, temperature 98.1 F (36.7 C), temperature source Oral, resp. rate 14, weight 91 kg (200 lb 9.9 oz), SpO2  99.00%.  General Examination: HEENT-  Normocephalic, no lesions, without obvious abnormality.  Normal external eye and conjunctiva.   Normal external nose  Normal pharynx. Neck supple Cardiovascular - regular rate and rhythm Lungs - non-labored breathing Abdomen - nontender, nondistended Extremities - no edema  Neurologic Examination: Mental Status: Patient is awake, alert, oriented to person, place, month, year, and situation. Immediate and remote memory are intact. Patient is able to give a clear and coherent history. He is dysarthric Cranial Nerves: II: Visual Fields are full. Pupils are equal, round, and reactive to light.  Discs are sharp. III,IV, VI: EOMI without ptosis or diploplia.  V,VII: Facial sensation is intact, decreased nasolabial fold on left with slowed smile. VIII: hearing is intact to voice X: Uvula elevates symmetrically XI: Shoulder shrug is symmetric. XII: tongue is midline without atrophy or fasciculations.  Motor: Tone is normal. Bulk is normal. Strength was 5/5 in his right arm and leg, 4+/5 in his left arm, and 5-/5 in his left leg Mild drift in left arm and leg.  Sensory: Sensation is diminished to light touch and temperature in the left arm and leg Deep Tendon Reflexes: 2+ and symmetric in the biceps and patellae.  Plantars: Toes are downgoing bilaterally.  Cerebellar: FNF intact on right, consistent with weakness on left  Gait: Unable to assess due to the acute nature of the evaluation in preparation of giving TPA.   Laboratory Studies:   Basic Metabolic Panel:  Lab 06/11/12 1610  NA 141  K 3.4*  CL 103  CO2 --  GLUCOSE 117*  BUN 5*  CREATININE 1.00  CALCIUM --  MG --  PHOS --    Liver Function Tests: No results found for this basename: AST:5,ALT:5,ALKPHOS:5,BILITOT:5,PROT:5,ALBUMIN:5 in the last 168 hours No results found for this basename: LIPASE:5,AMYLASE:5 in the last 168 hours No results found for this basename: AMMONIA:3 in  the last 168 hours  CBC:  Lab 06/11/12 0046 06/11/12 0017  WBC -- 8.3  NEUTROABS -- 4.5  HGB 15.0 14.9  HCT 44.0 43.1  MCV -- 101.7*  PLT -- 220    Cardiac Enzymes:  Lab 06/11/12 0017  CKTOTAL --  CKMB --  CKMBINDEX --  TROPONINI <0.30    BNP: No components found with this basename: POCBNP:5  CBG: No results found for this basename: GLUCAP:5 in the last 168 hours  Microbiology: No results found for this or any previous visit.  Coagulation Studies:  Basename 06/11/12 0017  LABPROT 13.0  INR 0.99    Urinalysis: No results found for this basename: COLORURINE:2,APPERANCEUR:2,LABSPEC:2,PHURINE:2,GLUCOSEU:2,HGBUR:2,BILIRUBINUR:2,KETONESUR:2,PROTEINUR:2,UROBILINOGEN:2,NITRITE:2,LEUKOCYTESUR:2 in the last 168 hours  Lipid Panel:     Component Value Date/Time   CHOL 150 02/21/2011 0445   TRIG 101 02/21/2011 0445   HDL 33* 02/21/2011 0445   CHOLHDL 4.5 02/21/2011 0445   VLDL 20 02/21/2011 0445   LDLCALC  Value: 97        Total Cholesterol/HDL:CHD Risk Coronary Heart Disease Risk Table                     Men   Women  1/2 Average Risk   3.4   3.3  Average Risk       5.0   4.4  2 X Average Risk   9.6   7.1  3 X Average Risk  23.4   11.0        Use the calculated Patient Ratio above and the CHD Risk Table to determine the patient's CHD Risk.        ATP III CLASSIFICATION (LDL):  <100     mg/dL   Optimal  960-454  mg/dL   Near or Above                    Optimal  130-159  mg/dL   Borderline  098-119  mg/dL   High  >147     mg/dL   Very High 05/21/5620 3086    HgbA1C:  Lab Results  Component Value Date   HGBA1C  Value: 6.4 (NOTE)                                                                       According to the ADA Clinical Practice Recommendations for 2011, when HbA1c is used as a screening test:   >=6.5%   Diagnostic of Diabetes Mellitus           (if abnormal result  is confirmed)  5.7-6.4%   Increased risk of developing Diabetes Mellitus  References:Diagnosis and  Classification of Diabetes Mellitus,Diabetes Care,2011,34(Suppl  1):S62-S69 and Standards of Medical Care in         Diabetes - 2011,Diabetes Care,2011,34  (Suppl 1):S11-S61.* 02/21/2011    Urine Drug Screen:   No results found for this basename: labopia, cocainscrnur, labbenz, amphetmu, thcu, labbarb    Alcohol Level: No results found for this basename: ETH:2 in the last 168 hours   Imaging: Ct Head Wo Contrast  06/11/2012  *RADIOLOGY REPORT*  Clinical Data: Left side weakness.  Slurred speech.  CT HEAD WITHOUT CONTRAST  Technique:  Contiguous axial images were obtained from the base of the skull through the vertex without contrast.  Comparison: None.  Findings: There is cortical atrophy with compensatory prominence of the ventricular system.  No evidence of acute abnormality including infarction, hemorrhage, mass lesion, mass effect, midline shift or abnormal extra-axial fluid collection.  No pneumocephalus.  There is mild mucosal thickening in the left maxillary sinus.  IMPRESSION: No acute finding.  Age advanced atrophy.   Original Report Authenticated By: Bernadene Bell. Maricela Curet, M.D.    Assessment: 46 y.o. male with left hemiparesis and hypesthesia that was sudden onset between 31 and 11 PM. His NIH stroke scale is 5, he has no contraindications to t-PA therefore we are initiating t-PA therapy and admitting to neuro ICU.   Stroke Risk Factors - family history and smoking  Plan: 1. HgbA1c, fasting lipid panel 2. MRI, MRA  of the brain without contrast 3. PT consult, OT consult, Speech consult 4. Echocardiogram 5. Carotid dopplers 6. Prophylactic therapy-None, will need asa 24 hours after getting tPA.  7. Risk factor modification 8. Telemetry monitoring 9. Frequent neuro checks 10. Given his history of drinking, would consider CIWA protocol after EtOH is no longer in his system.   Ritta Slot, MD [facility] [phone] 06/11/2012, 1:08 AM

## 2012-06-11 NOTE — ED Notes (Addendum)
TPA paused per neurology - 8 mg bolus has been infused; additional labs drawn per v.o. Neuro; phlebotomy at bedside

## 2012-06-11 NOTE — ED Notes (Signed)
Per wife, pt and her were watching TV, pt normal at that time; he subsequently got up to go to restroom then returned with facial droop, slurred speech and dazed appearance; pt brought to ED via POV with aforementioned complaints; speech extremely slurred, c/o numbness to LUE and around mouth

## 2012-06-11 NOTE — ED Notes (Signed)
14 french indwelling foley catheter inserted per Lanora Manis, RN without difficulty; approx 700 cc clear yellow urine obtained in drainage bag; foley left intact - pt tolerated well; specimen collected for u/a

## 2012-06-11 NOTE — ED Notes (Addendum)
Pt brought to [facility] for EDP evaluation by RN first, Meredeth Ide RN, after his initial assessment at RN first desk; pt was not brought back to triage

## 2012-06-11 NOTE — Progress Notes (Signed)
Stroke Team Progress Note  HISTORY Brandon [name] is an 46 y.o. male with a history of smoking who was in his normal state at 10:45 06/10/2012 as he watching TV with his wife. At 84, his wife noticed facial drooping and slurred speech and called 911. Since that time, he has continued to have slurred speech, and has noticed that his left arm and leg are numb. He states that his arm has been going "in and out" on him. He does not take any daily antiplatelet medicine. He states that he checked his blood pressure last week and it was 120 systolic.  He does have a history of alcohol abuse, consuming 7-10 beers per night, and tonight he feels that he has been on the low side, around 7. Patient was a TPA candidate. He was admitted to the neuro ICU for further evaluation and treatment.  SUBJECTIVE His fiance is at the bedside.  Overall he feels his condition is gradually improving. BP is elevated overnight, received labetalol overnight.  OBJECTIVE Most recent Vital Signs: Filed Vitals:   06/11/12 0530 06/11/12 0600 06/11/12 0630 06/11/12 0700  BP: 177/99 183/109 177/110 197/110  Pulse: 76 79 69 78  Temp:      TempSrc:      Resp: 15 19 18 23   Height:      Weight:      SpO2: 98% 94% 97% 98%   CBG (last 3)  No results found for this basename: GLUCAP:3 in the last 72 hours Intake/Output from previous day: 09/18 0701 - 09/19 0700 In: 410 [I.V.:398; IV Piggyback:12] Out: 1251 [Urine:1250; Emesis/NG output:1]  IV Fluid Intake:     . sodium chloride 100 mL/hr at 06/11/12 0048  . sodium chloride 75 mL/hr at 06/11/12 0145    MEDICATIONS    . alteplase  0.9 mg/kg Intravenous Once  . folic acid  1 mg Oral Daily  . LORazepam  1 mg Oral Once  . multivitamin with minerals  1 tablet Oral Daily  . nicotine  21 mg Transdermal Daily  . pantoprazole (PROTONIX) IV  40 mg Intravenous QHS  . thiamine  100 mg Oral Daily   Or  . thiamine  100 mg Intravenous Daily   PRN:  acetaminophen, acetaminophen,  labetalol, LORazepam, LORazepam, ondansetron (ZOFRAN) IV, DISCONTD: acetaminophen, DISCONTD: acetaminophen, DISCONTD: labetalol, DISCONTD: ondansetron (ZOFRAN) IV  Diet:  NPO  Activity:  Bedrest DVT Prophylaxis:  SCDs ordered  CLINICALLY SIGNIFICANT STUDIES Basic Metabolic Panel:  Lab 06/11/12 1324 06/11/12 0017  NA 141 140  K 3.4* 3.6  CL 103 100  CO2 -- 24  GLUCOSE 117* 134*  BUN 5* 7  CREATININE 1.00 0.68  CALCIUM -- 9.6  MG -- --  PHOS -- --   Liver Function Tests:  Lab 06/11/12 0017  AST 49*  ALT 34  ALKPHOS 93  BILITOT 0.1*  PROT 7.4  ALBUMIN 3.9   CBC:  Lab 06/11/12 0046 06/11/12 0017  WBC -- 8.3  NEUTROABS -- 4.5  HGB 15.0 14.9  HCT 44.0 43.1  MCV -- 101.7*  PLT -- 220   Coagulation:  Lab 06/11/12 0017  LABPROT 13.0  INR 0.99   Cardiac Enzymes:  Lab 06/11/12 0017  CKTOTAL --  CKMB --  CKMBINDEX --  TROPONINI <0.30   Urinalysis: No results found for this basename: COLORURINE:2,APPERANCEUR:2,LABSPEC:2,PHURINE:2,GLUCOSEU:2,HGBUR:2,BILIRUBINUR:2,KETONESUR:2,PROTEINUR:2,UROBILINOGEN:2,NITRITE:2,LEUKOCYTESUR:2 in the last 168 hours Lipid Panel    Component Value Date/Time   CHOL 207* 06/11/2012 0425   TRIG 66 06/11/2012 0425   HDL  84 06/11/2012 0425   CHOLHDL 2.5 06/11/2012 0425   VLDL 13 06/11/2012 0425   LDLCALC 110* 06/11/2012 0425   HgbA1C  Lab Results  Component Value Date   HGBA1C  Value: 6.4 (NOTE)                                                                       According to the ADA Clinical Practice Recommendations for 2011, when HbA1c is used as a screening test:   >=6.5%   Diagnostic of Diabetes Mellitus           (if abnormal result  is confirmed)  5.7-6.4%   Increased risk of developing Diabetes Mellitus  References:Diagnosis and Classification of Diabetes Mellitus,Diabetes Care,2011,34(Suppl 1):S62-S69 and Standards of Medical Care in         Diabetes - 2011,Diabetes Care,2011,34  (Suppl 1):S11-S61.* 02/21/2011    Urine Drug Screen:     No results found for this basename: labopia, cocainscrnur, labbenz, amphetmu, thcu, labbarb    Alcohol Level:  Lab 06/11/12 0017  ETH 199*    CT of the brain  06/11/2012  No acute finding.  Age advanced atrophy.    MRI of the brain    MRA of the brain    2D Echocardiogram    Carotid Doppler    CXR  06/11/2012  Mild basilar atelectasis.  Stable mild cardiomegaly.    EKG  .   Therapy Recommendations PT - ; OT - ; ST -   Physical Exam   Anxious middle aged Caucasian male not in distress.Awake alert. Afebrile. Head is nontraumatic. Neck is supple without bruit. Hearing is normal. Cardiac exam no murmur or gallop. Lungs are clear to auscultation. Distal pulses are well felt.  Neurological Exam : awake alert oriented to time place and person. Speech and language appear normal. Fundi were not visualized. Vision acuity and fields appear normal. Extraocular moments are full range with psychotic dysmetria to left gaze. Mild left lower facial weakness. Cough and gag are present but weak. Tongue is midline. Motor system exam reveals no upper or lower to drift. Diminished fine finger movements on the left. Orbits right over left upper extremity. Minimum weakness of left grip compared to the right. No lower extremity weakness Fine action tremor of outstretched upper extremities. No asterixis.. Minimum left finger-to-nose dysmetria. Normal knee to heel coordination bilaterally. Mild diminished left hemobody sensation Both plantars are downgoing. Gait was not tested. ASSESSMENT [name] is a 46 y.o. male presenting with LUE hemiparesis, L hemisensory deficit, slurred speech and facial droop. . Status post IV t-PA 06/11/2012 started at 0037. Imaging pending.  Work up underway. On no antiplatlets prior to admission. Now on no antiplatelets as within 24h of tpa for secondary stroke prevention. Patient with resultant dysarthria, saccadic dysmetria, left hemiparesis, .   etoh abuse, on CIWA  protocol  Hx pancreatitis May 2011  GERD  Cigarette smoker, 1 PPD Hypertension Hyperlipidemia, LDL 110, not on statin PTA  Hospital day # 0  TREATMENT/PLAN  Add aspirin 325 mg orally every day for secondary stroke prevention 24 h after tPA, after 06/12/2012 0037 and prior to midnight.  MRI, MRA, today at 1700. CT head in am if abnormal  2D, CD  Add diet  Check UDS, fiance denies drug use  Continue CIWA protocol  Add statin  -strict control of hypertension with blood pressure goal below 180/110  -Patient will need medical consult for management of alcohol withdrawal.  Discussed with Dr. Tyson Alias CCM and patient's wife and answered questions This patient is critically ill and at significant risk of neurological worsening, death and care requires constant monitoring of vital signs, hemodynamics,respiratory and cardiac monitoring,review of multiple databases, neurological assessment, discussion with family, other specialists and medical decision making of high complexity. I spent 30 minutes of neurocritical care time  in the care of  this patient.   Annie Main, MSN, RN, ANVP-BC, ANP-BC, [facility] Pager: [phone] 06/11/2012 8:13 AM  Scribe for Dr. Delia Heady, Stroke Center Medical Director, who has personally reviewed chart, pertinent data, examined the patient and developed the plan of care. Pager:  [phone]

## 2012-06-11 NOTE — ED Provider Notes (Signed)
History     CSN: [id number]  Arrival date & time 06/10/12  2358   First MD Initiated Contact with Patient 06/11/12 0006      Chief Complaint  Patient presents with  . Code Stroke    (Consider location/radiation/quality/duration/timing/severity/associated sxs/prior treatment) HPI Comments: Pt presents with acute onset of slurred speech and altered MS where he was last seen normal at 11:45, and then wife found him with significant L sided facial droop, slurred speech and change in mental status.  The sx are gradually improving but he does have active L facial droop and slurred speech.  He has had > 7 beers this evening.  He denies headache, cough, vomiting, shortness of breath, blurred vision. Over the course of the day he has had some waxing and waning numbness to the left upper extremity. He is not have any numbness at this time.  The history is provided by the patient and the spouse.    Past Medical History  Diagnosis Date  . Alcohol abuse   . Pancreatitis     CT findings in May 2011 with inflammation and pseudocyst  . Allergy   . Pancreatitis   . GERD (gastroesophageal reflux disease)     History reviewed. No pertinent past surgical history.  Family History  Problem Relation Age of Onset  . Stroke Mother     deceased  . Coronary artery disease Mother   . Alcohol abuse Mother   . Cancer Mother   . Hypertension Father     alive  . Alcohol abuse Father   . Diabetes Father   . Kidney disease Father     History  Substance Use Topics  . Smoking status: Current Every Day Smoker -- 1.0 packs/day for 27 years    Types: Cigarettes  . Smokeless tobacco: Not on file   Comment: Patient states he cannot afford nicotine patches at this time  . Alcohol Use: 50.4 oz/week    84 Cans of beer per week     Pt is trying to quit      Review of Systems  All other systems reviewed and are negative.    Allergies  Review of patient's allergies indicates no known  allergies.  Home Medications   Current Outpatient Rx  Name Route Sig Dispense Refill  . ASPIRIN 81 MG PO TABS Oral Take 81 mg by mouth daily.      Marland Kitchen FAMOTIDINE 20 MG PO TABS Oral Take 1 tablet (20 mg total) by mouth 2 (two) times daily. 60 tablet 6  . FOLIC ACID 1 MG PO TABS Oral Take 1 mg by mouth daily.      . MULTIVITAMINS PO CAPS Oral Take 1 capsule by mouth daily.      Marland Kitchen POLYETHYLENE GLYCOL 3350 PO POWD Oral Take 17 g by mouth daily as needed. 255 g   . TERAZOSIN HCL 1 MG PO CAPS Oral Take 1 capsule (1 mg total) by mouth at bedtime. 30 capsule 5  . TRAZODONE HCL 150 MG PO TABS Oral Take 300 mg by mouth at bedtime.        BP 147/109  Pulse 79  Temp 98 F (36.7 C) (Oral)  Resp 22  Wt 200 lb 9.9 oz (91 kg)  SpO2 99%  Physical Exam  Nursing note and vitals reviewed. Constitutional: He appears well-developed and well-nourished. No distress.  HENT:  Head: Normocephalic and atraumatic.  Mouth/Throat: Oropharynx is clear and moist. No oropharyngeal exudate.  Eyes: Conjunctivae normal and EOM  are normal. Pupils are equal, round, and reactive to light. Right eye exhibits no discharge. Left eye exhibits no discharge. No scleral icterus.  Neck: Normal range of motion. Neck supple. No JVD present. No thyromegaly present.  Cardiovascular: Normal rate, regular rhythm, normal heart sounds and intact distal pulses.  Exam reveals no gallop and no friction rub.   No murmur heard.      No carotid bruit auscultated  Pulmonary/Chest: Effort normal and breath sounds normal. No respiratory distress. He has no wheezes. He has no rales.  Abdominal: Soft. Bowel sounds are normal. He exhibits no distension and no mass. There is no tenderness.  Musculoskeletal: Normal range of motion. He exhibits no edema and no tenderness.  Lymphadenopathy:    He has no cervical adenopathy.  Neurological: He is alert. Coordination normal.       Normal grips left and right, normal strength to bilateral lower  extremities, speech is slurred, left facial droop, normal extraocular movements, normal sparing of the forehead, cranial nerves III through XII intact other than facial droop and decreased sensation to the left face. The patient is able to get out of the chair and take a few steps without any difficulty or problems with balance  Skin: Skin is warm and dry. No rash noted. No erythema.  Psychiatric: He has a normal mood and affect. His behavior is normal.    ED Course  Procedures (including critical care time)   Labs Reviewed  PROTIME-INR  APTT  CBC  DIFFERENTIAL  COMPREHENSIVE METABOLIC PANEL  TROPONIN I  ETHANOL   Ct Head Wo Contrast  06/11/2012  *RADIOLOGY REPORT*  Clinical Data: Left side weakness.  Slurred speech.  CT HEAD WITHOUT CONTRAST  Technique:  Contiguous axial images were obtained from the base of the skull through the vertex without contrast.  Comparison: None.  Findings: There is cortical atrophy with compensatory prominence of the ventricular system.  No evidence of acute abnormality including infarction, hemorrhage, mass lesion, mass effect, midline shift or abnormal extra-axial fluid collection.  No pneumocephalus.  There is mild mucosal thickening in the left maxillary sinus.  IMPRESSION: No acute finding.  Age advanced atrophy.   Original Report Authenticated By: Bernadene Bell. D'ALESSIO, M.D.      1. Stroke       MDM  At this time I suspect that the patient has had a stroke, he is intoxicated with alcohol which complicates his clinical presentation, stat CT scan ordered, neurology consultation requested.  Neurology at bedside:  ED ECG REPORT  I personally interpreted this EKG   Date: 06/11/2012   Rate: 75  Rhythm: normal sinus rhythm  QRS Axis: normal  Intervals: normal  ST/T Wave abnormalities: normal  Conduction Disutrbances:none  Narrative Interpretation: poor R wave progression  Old EKG Reviewed: unchanged from 01/2011   CT negative for acute stroke -  per Neuro - will give TPA, pt has not had any signficant improvement.  CRITICAL CARE Performed by: Vida Roller   Total critical care time: 30  Critical care time was exclusive of separately billable procedures and treating other patients.  Critical care was necessary to treat or prevent imminent or life-threatening deterioration.  Critical care was time spent personally by me on the following activities: development of treatment plan with patient and/or surrogate as well as nursing, discussions with consultants, evaluation of patient's response to treatment, examination of patient, obtaining history from patient or surrogate, ordering and performing treatments and interventions, ordering and review of laboratory studies, ordering  and review of radiographic studies, pulse oximetry and re-evaluation of patient's condition.Vida Roller, MD 06/11/12 [phone]

## 2012-06-12 DIAGNOSIS — I10 Essential (primary) hypertension: Secondary | ICD-10-CM

## 2012-06-12 DIAGNOSIS — K219 Gastro-esophageal reflux disease without esophagitis: Secondary | ICD-10-CM

## 2012-06-12 DIAGNOSIS — K859 Acute pancreatitis without necrosis or infection, unspecified: Secondary | ICD-10-CM

## 2012-06-12 LAB — COMPREHENSIVE METABOLIC PANEL
ALT: 24 U/L (ref 0–53)
Calcium: 10.1 mg/dL (ref 8.4–10.5)
Creatinine, Ser: 0.62 mg/dL (ref 0.50–1.35)
GFR calc Af Amer: >90 mL/min (ref >90)
Glucose, Bld: 150 mg/dL — ABNORMAL HIGH (ref 70–99)
Sodium: 137 mEq/L (ref 135–145)
Total Protein: 7.2 g/dL (ref 6.0–8.3)

## 2012-06-12 LAB — MAGNESIUM: Magnesium: 2.1 mg/dL (ref 1.5–2.5)

## 2012-06-12 LAB — PHOSPHORUS: Phosphorus: 4 mg/dL (ref 2.3–4.6)

## 2012-06-12 MED ORDER — ASPIRIN 325 MG PO TABS
325.0000 mg | ORAL_TABLET | Freq: Every day | ORAL | Status: DC
  Administered 2012-06-12: 325 mg via ORAL
  Filled 2012-06-12: qty 1

## 2012-06-12 MED ORDER — CHLORDIAZEPOXIDE HCL 25 MG PO CAPS
50.0000 mg | ORAL_CAPSULE | Freq: Three times a day (TID) | ORAL | Status: DC
  Administered 2012-06-12 – 2012-06-15 (×9): 50 mg via ORAL
  Filled 2012-06-12: qty 1
  Filled 2012-06-12 (×7): qty 2
  Filled 2012-06-12: qty 1
  Filled 2012-06-12: qty 2

## 2012-06-12 MED ORDER — ASPIRIN EC 81 MG PO TBEC
81.0000 mg | DELAYED_RELEASE_TABLET | Freq: Every day | ORAL | Status: DC
  Administered 2012-06-13 – 2012-06-18 (×6): 81 mg via ORAL
  Filled 2012-06-12 (×6): qty 1

## 2012-06-12 MED ORDER — CLOPIDOGREL BISULFATE 75 MG PO TABS
75.0000 mg | ORAL_TABLET | Freq: Every day | ORAL | Status: DC
  Administered 2012-06-12 – 2012-06-18 (×7): 75 mg via ORAL
  Filled 2012-06-12 (×10): qty 1

## 2012-06-12 NOTE — Progress Notes (Signed)
Stroke Team Progress Note  HISTORY Brandon [name] is an 46 y.o. male with a history of smoking who was in his normal state at 10:45 06/10/2012 as he watching TV with his wife. At 12, his wife noticed facial drooping and slurred speech and called 911. Since that time, he has continued to have slurred speech, and has noticed that his left arm and leg are numb. He states that his arm has been going "in and out" on him. He does not take any daily antiplatelet medicine. He states that he checked his blood pressure last week and it was 120 systolic.  He does have a history of alcohol abuse, consuming 7-10 beers per night, and tonight he feels that he has been on the low side, around 7. Patient was a TPA candidate. He was admitted to the neuro ICU for further evaluation and treatment.  SUBJECTIVE His fiance is at the bedside.  Overall he feels his condition is gradually improving. BP is elevated overnight, received labetalol overnight.  OBJECTIVE Most recent Vital Signs: Filed Vitals:   06/12/12 0300 06/12/12 0400 06/12/12 0500 06/12/12 0600  BP: 122/93 149/104 115/85 122/93  Pulse: 109 108 102 108  Temp:  98.2 F (36.8 C)    TempSrc:  Oral    Resp: 18 15 17 15   Height:      Weight:      SpO2: 97% 99% 97% 98%   CBG (last 3)  No results found for this basename: GLUCAP:3 in the last 72 hours Intake/Output from previous day: 09/19 0701 - 09/20 0700 In: 1725 [I.V.:1725] Out: 3400 [Urine:3400]  IV Fluid Intake:      . sodium chloride 75 mL/hr at 06/12/12 0602  . sodium chloride 75 mL/hr at 06/11/12 0145    MEDICATIONS     . aspirin  325 mg Oral Daily  . atorvastatin  10 mg Oral q1800  . folic acid  1 mg Oral Daily  . multivitamin with minerals  1 tablet Oral Daily  . nicotine  21 mg Transdermal Daily  . pantoprazole  40 mg Oral Q1200  . thiamine  100 mg Oral Daily  . DISCONTD: pantoprazole (PROTONIX) IV  40 mg Intravenous QHS  . DISCONTD: thiamine  100 mg Intravenous Daily   PRN:   acetaminophen, acetaminophen, labetalol, LORazepam, ondansetron (ZOFRAN) IV, DISCONTD: LORazepam, DISCONTD: LORazepam  Diet:  Cardiac  Activity:  Bedrest DVT Prophylaxis:  SCDs ordered  CLINICALLY SIGNIFICANT STUDIES Basic Metabolic Panel:   Lab 06/12/12 0425 06/11/12 0046 06/11/12 0017  NA 137 141 --  K 3.5 3.4* --  CL 97 103 --  CO2 26 -- 24  GLUCOSE 150* 117* --  BUN 9 5* --  CREATININE 0.62 1.00 --  CALCIUM 10.1 -- 9.6  MG 2.1 -- --  PHOS 4.0 -- --   Liver Function Tests:   Lab 06/12/12 0425 06/11/12 0017  AST 33 49*  ALT 24 34  ALKPHOS 91 93  BILITOT 0.5 0.1*  PROT 7.2 7.4  ALBUMIN 3.6 3.9   CBC:   Lab 06/11/12 0046 06/11/12 0017  WBC -- 8.3  NEUTROABS -- 4.5  HGB 15.0 14.9  HCT 44.0 43.1  MCV -- 101.7*  PLT -- 220   Coagulation:   Lab 06/11/12 0017  LABPROT 13.0  INR 0.99   Cardiac Enzymes:   Lab 06/11/12 0017  CKTOTAL --  CKMB --  CKMBINDEX --  TROPONINI <0.30    Lipid Panel    Component Value Date/Time  CHOL 207* 06/11/2012 0425   TRIG 66 06/11/2012 0425   HDL 84 06/11/2012 0425   CHOLHDL 2.5 06/11/2012 0425   VLDL 13 06/11/2012 0425   LDLCALC 110* 06/11/2012 0425   HgbA1C  Lab Results  Component Value Date   HGBA1C 5.8* 06/11/2012    Urine Drug Screen:      Component Value Date/Time   LABOPIA NONE DETECTED 06/11/2012 1126    Alcohol Level:   Lab 06/11/12 0017  ETH 199*    CT of the brain  06/11/2012  No acute finding.  Age advanced atrophy.    MRI of the brain  Multifocal areas of acute infarction in the right hemisphere. This could represent a shower of emboli or possibly hypoperfusion.  MRA of the brain Findings consistent with recent right internal carotid artery occlusion. Collaterals reconstitute the supraclinoid ICA      2D Echocardiogram Left ventricle: There are some unusual wall motionabnormalities. There is hypokinesis at the base of the anterior septum. There is hypokinesis of the mid inferiorwall and mid inferior  septum. The cavity size was mildly dilated. Wall thickness was increased in a pattern of mild LVH. The estimated ejection fraction was 55%. - Impressions: No cardiac source of embolism was identified, but cannot be ruled out on the basis of this examination.   Carotid Doppler  Right - Heavily calcified ICA with diminished flow proximally. There is a pre occlusion "thumping" in the mid to distal region. Vertebral artery flow is antegrade.  Left - Moderate calcification of the ICA with no obvious evidence of significant stenosis. Acoustic shadowing noted in the proximal region which may obscure higher velocities. Vertebral artery flow is antegrade   CXR  06/11/2012  Mild basilar atelectasis.  Stable mild cardiomegaly.    Therapy Recommendations PT -pending ; OT - (pending; ST - (none)  Physical Exam   Anxious middle aged Caucasian male not in distress.Awake alert. Afebrile. Head is nontraumatic. Neck is supple without bruit. Hearing is normal. Cardiac exam no murmur or gallop. Lungs are clear to auscultation. Distal pulses are well felt.   Neurological Exam : awake alert oriented to time place and person. Speech and language appear normal. Fundi were not visualized. Vision acuity and fields appear normal. Extraocular moments are full range with mild saccadic dysmetria to left gaze. Mild left lower facial weakness. Cough and gag are present but weak. Tongue is midline. Motor system exam reveals no upper or lower to drift. Diminished fine finger movements on the left. Orbits right over left upper extremity. Minimum weakness of left grip compared to the right. No lower extremity weakness Fine action tremor of outstretched upper extremities. No asterixis.. Minimum left finger-to-nose dysmetria. Normal knee to heel coordination bilaterally. Mild diminished left hemobody sensation Both plantars are downgoing. Gait was not tested.   ASSESSMENT Brandon [name] is a 46 y.o. 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 . Status post IV t-PA 06/11/2012 started at 0037. Imaging pending.   On no antiplatlets prior to admission. Now on ECASA 81mg  plus Plavix 75 mg daily  for secondary stroke prevention. Patient with resultant dysarthria, saccadic dysmetria, left hemiparesis, .   etoh abuse, on CIWA protocol, UDS negative  Hx pancreatitis May 2011  GERD  Cigarette smoker, 1 PPD Hypertension Hyperlipidemia, LDL 110, now on statin PT Carotid disease  Hospital day # 2  TREATMENT/PLAN  Change to  Plavix 75mg  plus baby asa 81mg  daily for secondary stroke prevention, use combination for 3 mos then plavix alone lifelong  Continue CIWA protocol.  strict control of hypertension with blood pressure goal below 180/110-Patient will need medical consult for management of alcohol withdrawal  Polysubstance abuse counselling  Risk Factor Modification  -Mobilize out of bed. PT and OT consults  -D/W patient and fiancee   Guy Franco, [facility],  MBA, MHA [facility] Pager: [phone] 06/12/2012 8:33 AM  Scribe for Dr. Delia Heady, Stroke Center Medical Director. He has personally reviewed chart, pertinent data, examined the patient and developed the plan of care. Pager:  [phone]

## 2012-06-12 NOTE — Consult Note (Signed)
Name: Brandon [name] MRN: [id number] DOB: 12-01-1965    LOS: 1  Referring Provider:  Pearlean Brownie Reason for Referral:  Alcohol abuse  PULMONARY / CRITICAL CARE MEDICINE  HPI:  46 yo male smoker admitted 06/11/2012 with slurred speech, and left sided numbness likely from acute CVA, and was given tPA.  He reports hx of heavy alcohol use (7-10 beers per day).  PCCM consulted 9/19 to assist with management with hx of ETOH.  Events Since Admission: 9/19 Admitted CVA s/p tPA  Current Status: Received several dose of ativan overnight.  Feels shaky and anxious.  Vital Signs: Temp:  [97.8 F (36.6 C)-98.3 F (36.8 C)] 98.2 F (36.8 C) (09/20 0400) Pulse Rate:  [66-109] 105  (09/20 0800) Resp:  [14-25] 25  (09/20 0800) BP: (114-192)/(76-125) 114/77 mmHg (09/20 0800) SpO2:  [95 %-99 %] 98 % (09/20 0800)  Physical Examination: General - no distress HEENT - no sinus tenderness, no oral exudate, no LAN Cardiac - s1s2 regular, no murmur Chest - no wheeze/rales Abd - soft, mild mid-epigastric tenderness, no organomegaly, +bowel sounds Ext - no edema Neuro - normal strength, speech fluent, slight tremor Psych - normal mood, behavior  Ct Head Wo Contrast  06/11/2012  *RADIOLOGY REPORT*  Clinical Data: Left side weakness.  Slurred speech.  CT HEAD WITHOUT CONTRAST  Technique:  Contiguous axial images were obtained from the base of the skull through the vertex without contrast.  Comparison: None.  Findings: There is cortical atrophy with compensatory prominence of the ventricular system.  No evidence of acute abnormality including infarction, hemorrhage, mass lesion, mass effect, midline shift or abnormal extra-axial fluid collection.  No pneumocephalus.  There is mild mucosal thickening in the left maxillary sinus.  IMPRESSION: No acute finding.  Age advanced atrophy.   Original Report Authenticated By: Bernadene Bell. Maricela Curet, M.D.    Mr Brain Wo Contrast  06/11/2012   *RADIOLOGY REPORT*  Clinical  Data:  Status post t-PA, now with facial droop and slurred, mass.  Left, left leg numbness. history of hypertension. History of alcoholism.  History of alcohol withdrawal seizures.  MRI HEAD WITHOUT CONTRAST MRA HEAD WITHOUT CONTRAST  Technique:  Multiplanar, multiecho pulse sequences of the brain and surrounding structures were obtained without intravenous contrast. Angiographic images of the head were obtained using MRA technique without contrast.  Comparison:  CT head 06/11/2012  MRI HEAD  Findings:  The patient had difficulty remaining motionless for the study.  Images are suboptimal.  Small or subtle lesions could be overlooked.  Multifocal areas of acute subcentimeter cortical subcortical infarction are seen in the right hemisphere (circle annotation); these involve the insular cortex, posterior temporal and parietal cortex, and periventricular white matter.  Gradient sequence shows no acute or chronic hemorrhage.  There is advanced atrophy with chronic microvascular ischemic change.  Absent flow void right internal carotid artery, discussed below.  Ventriculomegaly without signs of normal pressure hydrocephalus.  Intact calvarium.  No acute sinus, orbital, or mastoid abnormality  IMPRESSION: Multifocal areas of acute infarction in the right hemisphere.  This could represent a shower of emboli or possibly  hypoperfusion.  MRA HEAD  Findings: There is absent flow related enhancement in the right internal carotid artery consistent with recent occlusion. Reconstitution is suspected via collaterals from posterior circulation as well as retrograde flow in the ophthalmic.  The right MCA is patent but with less robust flow related enhancement of the left MCA.  There is severe proximal disease of the A1 segment right anterior  cerebral artery.  The basilar artery is widely patent with left vertebral dominant.  Right vertebral is hypoplastic.  Mild to moderate irregularity distal MCA and PCA branches consistent with an  atherosclerotic change. Negative for aneurysm.  IMPRESSION: Findings consistent with recent right internal carotid artery occlusion.  Collaterals reconstitute the supraclinoid ICA.  Severe disease right A1 segment anterior cerebral artery.   Original Report Authenticated By: Elsie Stain, M.D.    Dg Chest Port 1 View  06/11/2012  *RADIOLOGY REPORT*  Clinical Data: History is TPA, follow-up  PORTABLE CHEST - 1 VIEW  Comparison: CT angio chest of 02/20/2011 and chest x-ray of the same date  Findings: The lungs are not as well aerated.  Mild basilar atelectasis is present.  No focal infiltrate or effusion is seen. Cardiomegaly is stable.  IMPRESSION: Mild basilar atelectasis.  Stable mild cardiomegaly.   Original Report Authenticated By: Juline Patch, M.D.    Mr Mra Head/brain Wo Cm  06/11/2012   *RADIOLOGY REPORT*  Clinical Data:  Status post t-PA, now with facial droop and slurred, mass.  Left, left leg numbness. history of hypertension. History of alcoholism.  History of alcohol withdrawal seizures.  MRI HEAD WITHOUT CONTRAST MRA HEAD WITHOUT CONTRAST  Technique:  Multiplanar, multiecho pulse sequences of the brain and surrounding structures were obtained without intravenous contrast. Angiographic images of the head were obtained using MRA technique without contrast.  Comparison:  CT head 06/11/2012  MRI HEAD  Findings:  The patient had difficulty remaining motionless for the study.  Images are suboptimal.  Small or subtle lesions could be overlooked.  Multifocal areas of acute subcentimeter cortical subcortical infarction are seen in the right hemisphere (circle annotation); these involve the insular cortex, posterior temporal and parietal cortex, and periventricular white matter.  Gradient sequence shows no acute or chronic hemorrhage.  There is advanced atrophy with chronic microvascular ischemic change.  Absent flow void right internal carotid artery, discussed below.  Ventriculomegaly without signs of  normal pressure hydrocephalus.  Intact calvarium.  No acute sinus, orbital, or mastoid abnormality  IMPRESSION: Multifocal areas of acute infarction in the right hemisphere.  This could represent a shower of emboli or possibly  hypoperfusion.  MRA HEAD  Findings: There is absent flow related enhancement in the right internal carotid artery consistent with recent occlusion. Reconstitution is suspected via collaterals from posterior circulation as well as retrograde flow in the ophthalmic.  The right MCA is patent but with less robust flow related enhancement of the left MCA.  There is severe proximal disease of the A1 segment right anterior cerebral artery.  The basilar artery is widely patent with left vertebral dominant.  Right vertebral is hypoplastic.  Mild to moderate irregularity distal MCA and PCA branches consistent with an atherosclerotic change. Negative for aneurysm.  IMPRESSION: Findings consistent with recent right internal carotid artery occlusion.  Collaterals reconstitute the supraclinoid ICA.  Severe disease right A1 segment anterior cerebral artery.   Original Report Authenticated By: Elsie Stain, M.D.     ASSESSMENT AND PLAN  PULMONARY  A: Hx of tobacco abuse P:   Smoking cessation Oxygen as needed to keep SpO2 > 92% Nicotine patch  CARDIOVASCULAR  Lab 06/11/12 0017  TROPONINI <0.30  LATICACIDVEN --  PROBNP --   Lines: Peripheral IV  Echo 9/19>>Hypokinesis base anterior septum, mid inferior wall, and mid inferior septum, mild LVH, EF 55%   A: Hypertension>>improved P:  PRN labetalol per stroke team; goal SBP < 180 and DBP <  105  RENAL  Lab 06/12/12 0425 06/11/12 0046 06/11/12 0017  NA 137 141 140  K 3.5 3.4* --  CL 97 103 100  CO2 26 -- 24  BUN 9 5* 7  CREATININE 0.62 1.00 0.68  CALCIUM 10.1 -- 9.6  MG 2.1 -- --  PHOS 4.0 -- --   Intake/Output      09/19 0701 - 09/20 0700 09/20 0701 - 09/21 0700   I.V. (mL/kg) 1800 (20.1) 75 (0.8)   IV Piggyback      Total Intake(mL/kg) 1800 (20.1) 75 (0.8)   Urine (mL/kg/hr) 3400 (1.6)    Emesis/NG output     Total Output 3400    Net -1600 +75        Urine Occurrence  1 x    Foley: 9/19>>  A: Hypokalemia>>improved P:   F/u and replace electrolytes as needed  GASTROINTESTINAL  Lab 06/12/12 0425 06/11/12 0017  AST 33 49*  ALT 24 34  ALKPHOS 91 93  BILITOT 0.5 0.1*  PROT 7.2 7.4  ALBUMIN 3.6 3.9    A: Nutrition. 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 P:   Thiamine, folic acid Heart healthy diet Monitor for GI upset while taking ASA and hx of ETOH>>continue protonix  HEMATOLOGIC  Lab 06/11/12 0046 06/11/12 0017  HGB 15.0 14.9  HCT 44.0 43.1  PLT -- 220  INR -- 0.99  APTT -- 32   A: No active issues P:  F/u CBC intermittently  INFECTIOUS  Lab 06/11/12 0017  WBC 8.3  PROCALCITON --   Cultures:  Antibiotics:   A:  No evidence for infection. P:   Monitor clinically  ENDOCRINE  CBG (last 3)   A: Mild hyperglycemia.  HbA1C 5.8. P:   Monitor blood sugars on BMET Further assessment as outpt  NEUROLOGIC  MRI brain 9/19>>Multifocal areas of acute infarction in the right hemisphere MRA head 9/19>>Recent right internal carotid artery occlusion. Collaterals reconstitute the supraclinoid ICA.  Severe disease right A1 segment anterior cerebral artery.   A: CVA s/p tPA. Alcohol abuse. P:   CIWA assessment q4h Start scheduled librium 50 mg q8h Continue prn ativan IV for CIWA > 8 Stroke evaluation per neurology  BEST PRACTICE / DISPOSITION Level of Care:  ICU Primary Service:  Stroke Consultants:  PCCM Code Status: Full Diet:  Heart healthy DVT Px: SCD GI Px:  protonix   Jacoby Zanni, M.D. Pulmonary and Critical Care Medicine [facility] Pager: [phone]  06/12/2012, 8:58 AM

## 2012-06-12 NOTE — Progress Notes (Signed)
Speech Language Pathology Treatment Patient Details Name: Brandon [name] MRN: [id number] DOB: 08-14-1966 Today's Date: 06/12/2012 Time: 4098-1191 SLP Time Calculation (min): 20 min  Assessment / Plan / Recommendation Clinical Impression  Pt is oriented x4, is alert and cooperative.  He exhibits cognitive impairments in the area of recall, thought organization, and reasoning.  Continued speech therapy intervention is recommended acutely, and following DC from acute care.  If Rehab/CIR  is not anticipated, home health or outpatient cognitive rehabilitation is recommended.  RN reports continued tolerance of diet.  Speech continues clear and intelligible.      SLP Plan  Continue with current plan of care    Pertinent Vitals/Pain None reported  SLP Goals  SLP Goals Potential to Achieve Goals: Good Potential Considerations: Previous level of function Progress/Goals/Alternative treatment plan discussed with pt/caregiver and they: Agree;No caregivers available SLP Goal #1 - Progress: Progressing toward goal SLP Goal #2: Pt will complete concrete  thought organization/reasoning tasks with 90% accuracy given mod cues.  General Temperature Spikes Noted: No Respiratory Status: Room air Behavior/Cognition: Alert;Cooperative;Decreased sustained attention Patient Positioning: Upright in bed  Oral Cavity - Oral Hygiene Does patient have any of the following "at risk" factors?: None of the above Brush patient's teeth BID with toothbrush (using toothpaste with fluoride): Yes   Treatment Treatment focused on: Cognition Skilled Treatment: Portions of MoCA were administered.  Pt exhibits deficits in delayed recall, verbal fluency, abstraction, as well as verbal problem solving.  Pt rationale for decreased performance was that he hasn't been in school since the 1980's.  1/5 words recalled after 5 minute delay, increasing to  2/5 with multiple choice options.  Unable to complete serial 7 subtraction  task accurately.  Pt named 3 items in category naming task (N=>11), and provided concrete similarity between common objects. Pt verbalized his ability to get up and go to the bathroom independently, but was encouraged not to do so, and to call RN if needed. RN informed of this information. Bed alarm on.   GO    Celia B. Murvin [facility], CCC-SLP [phone]    Leigh Aurora 06/12/2012, 10:17 AM

## 2012-06-12 NOTE — Evaluation (Addendum)
Physical Therapy Evaluation Patient Details Name: Brandon [name] MRN: [id number] DOB: 17-Mar-1966 Today's Date: 06/12/2012 Time: 1112-1126 PT Time Calculation (min): 14 min  PT Assessment / Plan / Recommendation Clinical Impression  Patient is a 46 yo male admitted with slurred speech and left-sided weakness - Right CVA.  NIHSS score = 5.  Patient received tPA.  Patient with slight weakness LUE>LLE. Able to move fairly well, however very impulsive with decreased safety/judgement.  Patient will benefit from acute PT for mobility/gait training, balance training, and education.  Patient may benefit from HHPT for balance therapy at discharge.  Provided patient and wife with education on warning signs and risk factors for stroke - verbalized understanding.    PT Assessment  Patient needs continued PT services    Follow Up Recommendations  Home health PT;Supervision/Assistance - 24 hour    Barriers to Discharge None      Equipment Recommendations  None recommended by PT    Recommendations for Other Services     Frequency Min 4X/week    Precautions / Restrictions Precautions Precautions: Fall Precaution Comments: Patient very impulsive Restrictions Weight Bearing Restrictions: No         Mobility  Bed Mobility Bed Mobility: Supine to Sit;Sitting - Scoot to Edge of Bed Supine to Sit: 4: Min guard;HOB flat;With rails Sitting - Scoot to Edge of Bed: 4: Min guard;With rail Details for Bed Mobility Assistance: Verbal cues for safety and to move more slowly. Transfers Transfers: Sit to Stand;Stand to Sit Sit to Stand: 4: Min assist;With upper extremity assist;From bed Stand to Sit: 4: Min assist;With upper extremity assist;To bed Details for Transfer Assistance: Verbal cues to move more slowly and wait for assistance for safety.  Slightly decreased balance in standing. Ambulation/Gait Ambulation/Gait Assistance: 4: Min assist Ambulation Distance (Feet): 250 Feet Assistive device:  None Ambulation/Gait Assistance Details: Patient with slight decrease in balance with gait - slightly unsteady with wide base of support.  Cues to stand upright with head up and looking forward. Gait Pattern: Step-through pattern;Trunk flexed;Wide base of support Stairs: No Modified Rankin (Stroke Patients Only) Pre-Morbid Rankin Score: No symptoms Modified Rankin: Moderately severe disability    Exercises     PT Diagnosis: Abnormality of gait;Generalized weakness;Hemiplegia non-dominant side;Altered mental status  PT Problem List: Decreased strength;Decreased balance;Decreased mobility;Decreased cognition;Decreased safety awareness PT Treatment Interventions: Gait training;Stair training;Functional mobility training;Balance training;Neuromuscular re-education;Patient/family education   PT Goals Acute Rehab PT Goals PT Goal Formulation: With patient/family Time For Goal Achievement: 06/19/12 Potential to Achieve Goals: Good Pt will go Sit to Stand: Independently PT Goal: Sit to Stand - Progress: Goal set today Pt will go Stand to Sit: Independently PT Goal: Stand to Sit - Progress: Goal set today Pt will Ambulate: >150 feet;Independently (without loss of balance) PT Goal: Ambulate - Progress: Goal set today Pt will Go Up / Down Stairs: 3-5 stairs;with supervision;with rail(s) PT Goal: Up/Down Stairs - Progress: Goal set today Additional Goals Additional Goal #1: Patient will score > 20 on DGI balance assessment to indicate low fall risk. PT Goal: Additional Goal #1 - Progress: Goal set today  Visit Information  Last PT Received On: 06/12/12 Assistance Needed: +1    Subjective Data  Subjective: I'm walking like I usually do. Patient Stated Goal: To be able to go home soon.   Prior Functioning  Home Living Lives With: Spouse Available Help at Discharge: Family;Available 24 hours/day Type of Home: Mobile home Home Access: Stairs to enter [employer] of Steps:  4 Entrance Stairs-Rails: Right;Left Home Layout: One level Bathroom Shower/Tub: Engineer, manufacturing systems: Standard Home Adaptive Equipment: None Prior Function Level of Independence: Independent Able to Take Stairs?: Yes Driving: Yes Vocation: Other (comment) (installs flooring) Communication Communication: No difficulties Dominant Hand: Right    Cognition  Overall Cognitive Status: Impaired Area of Impairment: Safety/judgement Arousal/Alertness: Awake/alert Orientation Level: Appears intact for tasks assessed Behavior During Session: Restless Safety/Judgement: Decreased safety judgement for tasks assessed;Impulsive;Decreased awareness of need for assistance Safety/Judgement - Other Comments: Patient attempting to get OOB before lines arranged.  Patient instructed not to get out of chair without nursing assist.  Within 10 minutes of leaving room, patient standing with wife - RN to room.    Extremity/Trunk Assessment Right Upper Extremity Assessment RUE ROM/Strength/Tone: Within functional levels RUE Sensation: WFL - Light Touch Left Upper Extremity Assessment LUE ROM/Strength/Tone: Deficits LUE ROM/Strength/Tone Deficits: Noted slight decrease in strength 4/5 LUE Sensation: WFL - Light Touch Right Lower Extremity Assessment RLE ROM/Strength/Tone: Within functional levels RLE Sensation: WFL - Light Touch Left Lower Extremity Assessment LLE ROM/Strength/Tone: Deficits LLE ROM/Strength/Tone Deficits: Slight decrease in strength 4+/5 LLE Sensation: WFL - Light Touch Trunk Assessment Trunk Assessment: Normal   Balance Balance Balance Assessed: No  End of Session PT - End of Session Equipment Utilized During Treatment: Gait belt Activity Tolerance: Patient tolerated treatment well Patient left: in chair;with call bell/phone within reach;with family/visitor present Nurse Communication: Mobility status (Safety with impulsiveness)  GP     Vena Austria 06/12/2012,  2:52 PM Durenda Hurt. Renaldo Fiddler, [facility] Acute Rehab Services Pager [phone]

## 2012-06-13 ENCOUNTER — Other Ambulatory Visit (HOSPITAL_COMMUNITY): Payer: 59

## 2012-06-13 ENCOUNTER — Inpatient Hospital Stay (HOSPITAL_COMMUNITY): Payer: Medicaid Other

## 2012-06-13 ENCOUNTER — Encounter (HOSPITAL_COMMUNITY): Payer: Self-pay | Admitting: Cardiology

## 2012-06-13 DIAGNOSIS — E785 Hyperlipidemia, unspecified: Secondary | ICD-10-CM | POA: Diagnosis present

## 2012-06-13 DIAGNOSIS — F10239 Alcohol dependence with withdrawal, unspecified: Secondary | ICD-10-CM

## 2012-06-13 DIAGNOSIS — I639 Cerebral infarction, unspecified: Secondary | ICD-10-CM | POA: Diagnosis present

## 2012-06-13 DIAGNOSIS — I2510 Atherosclerotic heart disease of native coronary artery without angina pectoris: Secondary | ICD-10-CM

## 2012-06-13 DIAGNOSIS — R569 Unspecified convulsions: Secondary | ICD-10-CM

## 2012-06-13 HISTORY — DX: Atherosclerotic heart disease of native coronary artery without angina pectoris: I25.10

## 2012-06-13 MED ORDER — CEPHALEXIN 500 MG PO CAPS
500.0000 mg | ORAL_CAPSULE | Freq: Two times a day (BID) | ORAL | Status: DC
  Administered 2012-06-13 – 2012-06-18 (×11): 500 mg via ORAL
  Filled 2012-06-13 (×13): qty 1

## 2012-06-13 MED ORDER — ATORVASTATIN CALCIUM 20 MG PO TABS
20.0000 mg | ORAL_TABLET | Freq: Every day | ORAL | Status: DC
  Filled 2012-06-13: qty 1

## 2012-06-13 MED ORDER — IOHEXOL 350 MG/ML SOLN
50.0000 mL | Freq: Once | INTRAVENOUS | Status: AC | PRN
  Administered 2012-06-13: 50 mL via INTRAVENOUS

## 2012-06-13 MED ORDER — ATORVASTATIN CALCIUM 80 MG PO TABS
80.0000 mg | ORAL_TABLET | Freq: Every day | ORAL | Status: DC
  Administered 2012-06-13 – 2012-06-17 (×5): 80 mg via ORAL
  Filled 2012-06-13 (×7): qty 1

## 2012-06-13 NOTE — Progress Notes (Signed)
History: Brandon [name] is an 46 y.o. male with a history of smoking who was in his normal state at 10:45 06/10/2012 as he watching TV with his wife. At 82, his wife noticed facial drooping and slurred speech and called 911. Since that time, he has continued to have slurred speech, and has noticed that his left arm and leg are numb. He states that his arm has been going "in and out" on him. He does not take any daily antiplatelet medicine. He states that he checked his blood pressure last week and it was 120 systolic. He does have a history of alcohol abuse, consuming 7-10 beers per night, and tonight he feels that he has been on the low side, around 7. Patient was a TPA candidate. He was admitted to the neuro ICU for further evaluation and treatment.  LSN:10:45 06/10/2012 tPA Given: yes  Subjective: Currently, patient denies slurred speech, difficulties swallowing, VA disturbance, dizziness, weakness or numbness of extremities.   On further discussion, patient reports episodes of exhaustion, increased pattern of indigestion, nausea and left arm pain. The last such episode occurred two weeks ago.  Pt denies personal history of heart attack, but has strong family history of stroke and heart attack; mother had bypass in late 51's, died of massive stroke at 64.   Patient drinks alcohol excessively, has been to rehab 3 times and AA.  Gave up hard liquor but continues to drink beer.   Presently, patient denies anxiety or agitation.  Objective: BP 116/76  Pulse 86  Temp 97.4 F (36.3 C) (Oral)  Resp 16  Ht 6\' 1"  (1.854 m)  Wt 89.6 kg (197 lb 8.5 oz)  BMI 26.06 kg/m2  SpO2 96%  Diet: Cardiac  Activity: up with assist DVT Prophylaxis: SCDs ordered  Medications: Scheduled:   . aspirin EC  81 mg Oral Daily  . atorvastatin  10 mg Oral q1800  . chlordiazePOXIDE  50 mg Oral Q8H  . clopidogrel  75 mg Oral QAC breakfast  . folic acid  1 mg Oral Daily  . multivitamin with minerals  1 tablet Oral  Daily  . nicotine  21 mg Transdermal Daily  . pantoprazole  40 mg Oral Q1200  . thiamine  100 mg Oral Daily   Neurologic Exam: Mental Status: Alert, oriented, thought content appropriate.  Speech fluent without evidence of aphasia. Able to follow 3 step commands without difficulty. Cranial Nerves: II- Visual fields grossly intact. III/IV/VI-Extraocular movements intact.  Pupils reactive bilaterally. V/VII-minimal left upper and lower facial droop. VIII-hearing grossly intact IX/X-normal gag XI-bilateral shoulder shrug XII-midline tongue extension Motor: 5/5 bilaterally with normal tone and bulk.diminished fine finger movements on left and orbits right over left upper extremities. Sensory: Light touch intact throughout, bilaterally Deep Tendon Reflexes: 2+ and symmetric throughout Plantars: Downgoing bilaterally Cerebellar: Normal finger-to-nose, normal rapid alternating movements and normal heel-to-shin test.   Physical exam: Lungs with decreased air movement throughout; no rhonchi or wheeze. Cor RRR without M/G/R Extremities: has saline locked IV's anterior forearms.  Both have local swelling and induration with mild tenderness and increased warmth.  The left has a palpable cord vs induration and red streak towards the antecubital fossa.  Lab Results: Basic Metabolic Panel:  Lab 06/12/12 1610 06/11/12 0046 06/11/12 0017  NA 137 141 --  K 3.5 3.4* --  CL 97 103 --  CO2 26 -- 24  GLUCOSE 150* 117* --  BUN 9 5* --  CREATININE 0.62 1.00 --  CALCIUM 10.1 --  9.6  MG 2.1 -- --  PHOS 4.0 -- --   Liver Function Tests:  Lab 06/12/12 0425 06/11/12 0017  AST 33 49*  ALT 24 34  ALKPHOS 91 93  BILITOT 0.5 0.1*  PROT 7.2 7.4  ALBUMIN 3.6 3.9   CBC:  Lab 06/11/12 0046 06/11/12 0017  WBC -- 8.3  NEUTROABS -- 4.5  HGB 15.0 14.9  HCT 44.0 43.1  MCV -- 101.7*  PLT -- 220   Hemoglobin A1C:  Lab 06/11/12 0425  HGBA1C 5.8*   Fasting Lipid Panel:  Lab 06/11/12 0425    CHOL 207*  HDL 84  LDLCALC 110*  TRIG 66  CHOLHDL 2.5  LDLDIRECT --   Coagulation:  Lab 06/11/12 0017  LABPROT 13.0  INR 0.99   Urine Drug Screen: Drugs of Abuse     Component Value Date/Time   LABOPIA NONE DETECTED 06/11/2012 1126   COCAINSCRNUR NONE DETECTED 06/11/2012 1126   LABBENZ NONE DETECTED 06/11/2012 1126   AMPHETMU NONE DETECTED 06/11/2012 1126   THCU NONE DETECTED 06/11/2012 1126   LABBARB NONE DETECTED 06/11/2012 1126    Alcohol Level:  Lab 06/11/12 0017  ETH 199*    Study Results:   06/11/2012  MRI HEAD  Findings:  The patient had difficulty remaining motionless for the study.  Images are suboptimal.  Small or subtle lesions could be overlooked.  Multifocal areas of acute subcentimeter cortical subcortical infarction are seen in the right hemisphere (circle annotation); these involve the insular cortex, posterior temporal and parietal cortex, and periventricular white matter.  Gradient sequence shows no acute or chronic hemorrhage.  There is advanced atrophy with chronic microvascular ischemic change.  Absent flow void right internal carotid artery, discussed below.  Ventriculomegaly without signs of normal pressure hydrocephalus.  Intact calvarium.  No acute sinus, orbital, or mastoid abnormality  IMPRESSION: Multifocal areas of acute infarction in the right hemisphere.  This could represent a shower of emboli or possibly  hypoperfusion.  Elsie Stain, M.D.   06/11/2012 MRA HEAD  Findings: There is absent flow related enhancement in the right internal carotid artery consistent with recent occlusion. Reconstitution is suspected via collaterals from posterior circulation as well as retrograde flow in the ophthalmic.  The right MCA is patent but with less robust flow related enhancement of the left MCA.  There is severe proximal disease of the A1 segment right anterior cerebral artery.  The basilar artery is widely patent with left vertebral dominant.  Right vertebral is  hypoplastic.  Mild to moderate irregularity distal MCA and PCA branches consistent with an atherosclerotic change. Negative for aneurysm.   IMPRESSION: Findings consistent with recent right internal carotid artery occlusion.  Collaterals reconstitute the supraclinoid ICA.  Severe disease right A1 segment anterior cerebral artery.  Elsie Stain, M.D.    06/11/12 2D Echocardiogram  Left ventricle: There are some unusual wall motion abnormalities. There is hypokinesis at the base of the anterior septum. There is hypokinesis of the mid inferiorwall and mid inferior septum. The cavity size was mildly dilated. Wall thickness was increased in a pattern of mild LVH. The estimated ejection fraction was 55%. - Impressions: No cardiac source of embolism was identified, but cannot be ruled out on the basis of this examination.  06/11/12 Carotid Doppler  Right - Heavily calcified ICA with diminished flow proximally. There is a pre occlusion "thumping" in the mid to distal region. Vertebral artery flow is antegrade.  Left - Moderate calcification of the ICA with no  obvious evidence of significant stenosis. Acoustic shadowing noted in the proximal region which may obscure higher velocities. Vertebral artery flow is antegrade  Therapies: ZO:[id number] with slight weakness LUE>LLE. Able to move fairly well, however very impulsive with decreased safety/judgement. Patient will benefit from acute PT for mobility/gait training, balance training, and education. Patient may benefit from HHPT for balance therapy at discharge.   WUJ:[id number] with decreased clarity of thinking, difficulty with recall of verbal information, decreased verbal organization. Overall with improvements after administration of tPa; dysarthria has resolved.  Assessment/Plan: [name] is a 46 y.o. 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  Status post IV t-PA 06/11/2012 started at 0037.  On no antiplatlets prior to admission. Now on ECASA 81mg  plus Plavix 75 mg daily for secondary stroke prevention.  Echo shows multiple wall motion abnormalities concerning for embolic potential as well as undiagnosed CAD. etoh abuse, on CIWA protocol, UDS negative  Hx pancreatitis May 2011  GERD  Cigarette smoker, 1 PPD Hypertension  Hyperlipidemia, LDL 110, now on statin PT  Carotid disease  Plan: 1. Cardiology consult for cardiac assessment; rec cardiac eval and may need heart cath.    . 2. Increase lipitor to 80 mg daily ( highly suspect for recent acs) 3. Keflex 500 mg BID x 7 days for cellulitis. 4. D/C bilateral saline locked IVs due to infection 5. Case management consult for medication assistance 6. Plavix 75mg  plus baby asa 81mg  daily for secondary stroke prevention, use combination for 3 mos then plavix alone lifelong 7. Continue CIWA protocol- Dr. Sherene Sires following. 8. strict control of hypertension with blood pressure goal below 180/110 9. Polysubstance abuse counselling - talked with patient had significant other extensively about the risks of ongoing tobacco and alcohol abuse. Nicotine patch in place. 10. Aggressive Risk Factor Modification. 11. D/W Dr Donnie Aho cardiology   LOS: 2 days   Marya Fossa PA-C [facility] [phone] 06/13/2012  9:27 AM I have personally examined this patient, reviewed pertinent data and formulated above plan Delia Heady, MD Medical Director [facility] Pager: [phone] 06/13/2012 12:24 PM

## 2012-06-13 NOTE — Progress Notes (Signed)
Contacted main pharmacy and pt is eligible for [facility] med assistance fund. Fund will cover complete dose of abx and 3 day supply of any other meds except narcotics. NCM will continue to follow until D/c. Will search for other program that will assist with Plavix.  Isidoro Donning RN CCM Case Mgmt phone [phone]

## 2012-06-13 NOTE — Evaluation (Signed)
Occupational Therapy Evaluation Patient Details Name: Brandon [name] MRN: [id number] DOB: 1966/06/08 Today's Date: 06/13/2012 Time: 1478-2956 OT Time Calculation (min): 31 min  OT Assessment / Plan / Recommendation Clinical Impression  Pt admitted with slurred speech and left-sided weakness, received tPA. MRA + for Rt CVA. Pt presents with deficits in areas of cognition, strength and balance that impact pt's safety with ADLs and mobility. Pt will benefit from skilled OT in the acute setting followed by OPOT to maximize I with ADL and ADL mobility     OT Assessment  Patient needs continued OT Services    Follow Up Recommendations  Outpatient OT;Supervision/Assistance - 24 hour    Barriers to Discharge      Equipment Recommendations  None recommended by PT;None recommended by OT    Recommendations for Other Services    Frequency  Min 2X/week    Precautions / Restrictions Precautions Precautions: Fall Restrictions Weight Bearing Restrictions: No   Pertinent Vitals/Pain Pt denies any pain at this time    ADL  Eating/Feeding: Simulated;Independent Where Assessed - Eating/Feeding: Edge of bed Grooming: Performed;Supervision/safety Where Assessed - Grooming: Unsupported standing Upper Body Bathing: Simulated;Supervision/safety Where Assessed - Upper Body Bathing: Unsupported standing Lower Body Bathing: Simulated;Min guard Where Assessed - Lower Body Bathing: Unsupported standing Lower Body Dressing: Performed;Supervision/safety Where Assessed - Lower Body Dressing: Unsupported sit to stand Toilet Transfer: Performed;Modified independent Toilet Transfer Method: Sit to Barista: Regular height toilet;Grab bars Equipment Used: Gait belt Transfers/Ambulation Related to ADLs: Pt Supervision with ambulation in room and halls; no LOB. Wide BOS and kyphotic posture; wife states this is baseline    OT Diagnosis: Generalized weakness;Cognitive deficits  OT  Problem List: Decreased strength;Decreased activity tolerance;Impaired balance (sitting and/or standing);Decreased safety awareness;Decreased knowledge of use of DME or AE;Decreased knowledge of precautions;Impaired UE functional use OT Treatment Interventions: Self-care/ADL training;DME and/or AE instruction;Therapeutic activities;Cognitive remediation/compensation;Patient/family education;Balance training   OT Goals Acute Rehab OT Goals OT Goal Formulation: With patient/family Time For Goal Achievement: 06/20/12 Potential to Achieve Goals: Good ADL Goals Pt Will Perform Upper Body Bathing: Independently;Standing at sink ADL Goal: Upper Body Bathing - Progress: Goal set today Pt Will Perform Lower Body Bathing: Independently;Standing at sink ADL Goal: Lower Body Bathing - Progress: Goal set today Pt Will Perform Lower Body Dressing: Independently;Sit to stand from bed;Sit to stand from chair ADL Goal: Lower Body Dressing - Progress: Goal set today Pt Will Transfer to Toilet: Independently;Ambulation ADL Goal: Toilet Transfer - Progress: Goal set today Pt Will Perform Toileting - Clothing Manipulation: Independently;Standing ADL Goal: Toileting - Clothing Manipulation - Progress: Goal set today Pt Will Perform Tub/Shower Transfer: Tub transfer;Ambulation;Independently ADL Goal: Tub/Shower Transfer - Progress: Goal set today Additional ADL Goal #1: Pt will be I with LUE strengthening HEP ADL Goal: Additional Goal #1 - Progress: Goal set today  Visit Information  Last OT Received On: 06/13/12 Assistance Needed: +1    Subjective Data  Subjective: I probably have wood in my eye (pt with difficulty keeping eyes on pen during vision testing) Patient Stated Goal: Return home/to work   Prior Functioning  Vision/Perception  Home Living Lives With: Spouse Available Help at Discharge: Family;Available 24 hours/day Type of Home: Mobile home Home Access: Stairs to enter Entrance  Stairs-Number of Steps: 4 Entrance Stairs-Rails: Right;Left Home Layout: One level Bathroom Shower/Tub: Engineer, manufacturing systems: Standard Home Adaptive Equipment: None Prior Function Level of Independence: Independent Able to Take Stairs?: Yes Driving: No (does not have license) Vocation:  (  installs hardwood flooring) Communication Communication: No difficulties Dominant Hand: Right   Vision - Assessment Eye Alignment: Within Functional Limits Additional Comments: Pt with difficulty following directions and attending to tasks during vision assessment. All EOM WFL. However, pt with difficulty maintaining gaze- may again be related to decr attention. Left pupil appears larger than right.   Cognition  Area of Impairment: Attention Arousal/Alertness: Awake/alert Orientation Level: Appears intact for tasks assessed Safety/Judgement: Decreased safety judgement for tasks assessed Cognition - Other Comments: Pt's deficits have improved markedly, however, pt continues with deficits, especially cognitive which pt is downplaying and at times, denying. Pt was able to perform simple math and money exchanges with only Min difficulty. Recall was more difficult.     Extremity/Trunk Assessment Right Upper Extremity Assessment RUE ROM/Strength/Tone: Within functional levels RUE Sensation: WFL - Light Touch RUE Coordination: WFL - gross/fine motor Left Upper Extremity Assessment LUE ROM/Strength/Tone: Deficits LUE ROM/Strength/Tone Deficits: slight decr in strength ~4+/5; upper trapezius with poor recruitment initially only trace movement noted for shoulder shrug. Pt able to achieve full active ROM in front of mirror LUE Sensation:  (pt denies any paresthesias) LUE Coordination: WFL - gross/fine motor   Mobility  Shoulder Instructions  Transfers Sit to Stand: 5: Supervision;From bed;With upper extremity assist Stand to Sit: 5: Supervision;With upper extremity assist;To bed Details for  Transfer Assistance: supervision for safety       Exercise Other Exercises Other Exercises: shoulder shrugs in front of mirror 3 sets of 10 each day   Balance     End of Session OT - End of Session Equipment Utilized During Treatment: Gait belt Activity Tolerance: Patient tolerated treatment well Patient left: in bed;with call bell/phone within reach;with family/visitor present  GO     Marynell Bies 06/13/2012, 11:09 AM

## 2012-06-13 NOTE — Consult Note (Signed)
Cardiology Consult Note  Admit date: 06/11/2012 Name: Brandon [name] 46 y.o.  male DOB:  [birth year] MRN:  [id number]  Today's date:  06/13/2012  Referring Physician:   Dr. Delia Heady   Reason for Consultation:  Abnormal echocardiogram and cardiac evaluation  IMPRESSIONS: 1. Abnormal echocardiogram with wall motion abnormality that may be due to a previous old silent infarction. He does have evidence of anteroseptal infarction on EKG and has evidence of coronary calcification on a previous CT scan of the chest. 2. Hyperlipidemia not currently treated 3. 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 Alcoholism active with alcohol abuse 5. Tobacco abuse disorder with ongoing smoking 6. History of pancreatitis and hepatic steatosis 7. Carotid artery disease.  RECOMMENDATION: The patient has evidence of answers chronic vascular disease with both carotid artery disease as well as coronary artery disease with coronary calcification. I would recommend that he have a Lexiscan cardoiolite scan prior to discharge to determine if he has a high risk coronary situation or not. He will need intensive treatment of his lipids with an LDL goal of less than 70, ACE inhibitor, and needs to stop smoking and stop abusing alcohol. I would recommend that he have a Lexiscan test done as an inpatient. Other question is whether he needs to have a vascular surgery evaluation of the right internal carotid artery occlusion.  HISTORY: This 46 year-old male has a history of severe alcoholism and tobacco abuse. He has had difficulty with pancreatitis and also esophageal reflux. He was admitted with a right ICA occlusion that resulted in slurred speech and facial drooping. He was treated with TPA and his symptoms improved. He was found to have subtotal occlusion of the right internal carotid artery and some disease on the left. An  echocardiogram showed some wall motion abnormalities in the basal septum in the basal and mid inferior wall. Cardiology evaluation was requested.  The patient denies any prior history of cardiac disease. He does have note of calcification of the coronary arteries on a CT scan of the chest done back in 2012. He has smoked heavily for years. He formally worked IT trainer and denied exertional angina or dyspnea on exertion. He does not have any PND, orthopnea or palpitations. He does have a strong family history of heart disease. His mother had bypass surgery in her late 5s and several grandparents have had heart disease and stroke in their 55s. Can not get a definite history of angina. He does have a rectal dysfunction.    Past Medical History  Diagnosis Date  . Alcohol abuse   . Pancreatitis     CT findings in May 2011 with inflammation and pseudocyst  . GERD (gastroesophageal reflux disease)   . CAD (coronary artery disease) 06/13/2012    Calcification noted on CTA of chest in 2012 Wall motion abnormality on ECHO    . 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 Hyperlipidemia       History reviewed. No pertinent past surgical history.   Allergies:   delerium to Zen[employer]   Medications: Prior to Admission medications   Medication Sig Start Date End Date Taking? Authorizing Provider  aspirin 81 MG tablet Take 81 mg by mouth daily.     Yes Historical Provider, MD  famotidine (PEPCID) 20 MG tablet Take 1 tablet (20 mg total) by mouth 2 (two) times daily. 02/10/12 02/09/13 Yes Danley Danker, MD  folic acid (FOLVITE) 1 MG tablet Take 1 mg by mouth daily.     Yes Historical Provider, MD  Multiple Vitamin (MULTIVITAMIN) capsule Take 1 capsule by mouth daily.     Yes Historical Provider, MD  traZODone (DESYREL) 150 MG tablet Take 300 mg by mouth at bedtime.     Yes Historical Provider, MD    Family History: Family Status  Relation Status Death Age  . Mother Deceased  64    died of CVA  . Father Alive   . Sister Alive     Social History:   reports that he has been smoking Cigarettes.  He has a 27 pack-year smoking history. He does not have any smokeless tobacco history on file. He reports that he drinks about 50.4 ounces of alcohol per week. He reports that he does not use illicit drugs.   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 He does not have any GI symptoms. Other than as noted above the remainder of the review of systems is unremarkable.  Physical Exam: Blood pressure 119/87, pulse 96, temperature 97.8 F (36.6 C), temperature source Oral, resp. rate 18, height 6\' 1"  (1.854 m), weight 89.6 kg (197 lb 8.5 oz), SpO2 100.00%.   General appearance: alert, cooperative, appears stated age and no distress Head: Normocephalic, without obvious abnormality, atraumatic Eyes: conjunctivae/corneas clear. PERRL, EOM's intact. Fundi benign. Throat: lips, mucosa, and tongue normal; teeth and gums normal Neck: no adenopathy, no JVD and supple, symmetrical, trachea midline Lungs: clear to auscultation bilaterally Heart: regular rate and rhythm, S1, S2 normal, no murmur, click, rub or gallop Abdomen: soft, non-tender; bowel sounds normal; no masses,  no organomegaly Rectal: deferred Extremities: extremities normal, atraumatic, no cyanosis or edema Pulses: 2+ and symmetric Neurologic: Grossly normal  Labs: CBC  Basename 06/11/12 0046 06/11/12 0017  WBC -- 8.3  RBC -- 4.24  HGB 15.0 --  HCT 44.0 --  PLT -- 220  MCV -- 101.7*  MCH -- 35.1*  MCHC -- 34.6  RDW -- 13.7  LYMPHSABS -- 2.8  MONOABS -- 0.8  EOSABS -- 0.1  BASOSABS -- 0.1   CMP   Basename 06/12/12 0425  NA 137  K 3.5  CL 97  CO2 26  GLUCOSE 150*  BUN 9  CREATININE 0.62    CALCIUM 10.1  PROT 7.2  ALBUMIN 3.6  AST 33  ALT 24  ALKPHOS 91  BILITOT 0.5  GFRNONAA >90  GFRAA >90   Cardiac Panel (last 3 results)  Basename 06/11/12 0017  CKTOTAL --  CKMB --  TROPONINI <0.30  RELINDX --   Lipid Panel     Component Value Date/Time   CHOL 207* 06/11/2012 0425   TRIG 66 06/11/2012 0425   HDL 84 06/11/2012 0425   CHOLHDL 2.5 06/11/2012 0425   VLDL 13 06/11/2012 0425   LDLCALC 110* 06/11/2012 0425    Radiology: Mild atelectasis on chest x-ray, mild cardiomegaly  EKG: Sinus rhythm, possible septal infarct  Signed:  W. Ashley Royalty MD [facility]   Cardiology Consultant  06/13/2012, 12:44 PM

## 2012-06-13 NOTE — Progress Notes (Signed)
Name: Brandon [name] MRN: [id number] DOB: Feb 06, 1966    LOS: 2  Referring Provider:  Pearlean Brownie Reason for Referral:  Alcohol abuse  PULMONARY / CRITICAL CARE MEDICINE  HPI:  46 yo male smoker admitted 06/11/2012 with slurred speech, and left sided numbness likely from acute CVA, and was given tPA.  He reports hx of heavy alcohol use (7-10 beers per day).  PCCM consulted 9/19 to assist with management with hx of ETOH.  Events Since Admission: 9/19 Admitted CVA s/p tPA  Current Status: Poor sleep but no obvious dts with min tremulous, no tachycardia  Vital Signs: Temp:  [97.4 F (36.3 C)-98 F (36.7 C)] 97.8 F (36.6 C) (09/21 1053) Pulse Rate:  [86-125] 96  (09/21 1053) Resp:  [16-20] 18  (09/21 1053) BP: (110-119)/(63-94) 119/87 mmHg (09/21 1053) SpO2:  [96 %-100 %] 100 % (09/21 1053)  Physical Examination: General - no distress HEENT - no sinus tenderness, no oral exudate, no LAN Cardiac - s1s2 regular, no murmur Chest - no wheeze/rales Abd - soft, mild mid-epigastric tenderness, no organomegaly, +bowel sounds Ext - no edema Neuro - normal strength, speech fluent, slight tremor Psych - normal mood, behavior   ASSESSMENT AND PLAN  PULMONARY  A: Hx of tobacco abuse P:   Smoking cessation Oxygen as needed to keep SpO2 > 92% Nicotine patch  CARDIOVASCULAR  Lab 06/11/12 0017  TROPONINI <0.30  LATICACIDVEN --  PROBNP --      Echo 9/19>>Hypokinesis base anterior septum, mid inferior wall, and mid inferior septum, mild LVH, EF 55%   A: Hypertension>>improved P:  PRN labetalol per stroke team; goal SBP < 180 and DBP < 105  RENAL  Lab 06/12/12 0425 06/11/12 0046 06/11/12 0017  NA 137 141 140  K 3.5 3.4* --  CL 97 103 100  CO2 26 -- 24  BUN 9 5* 7  CREATININE 0.62 1.00 0.68  CALCIUM 10.1 -- 9.6  MG 2.1 -- --  PHOS 4.0 -- --   Intake/Output      09/20 0701 - 09/21 0700 09/21 0701 - 09/22 0700   I.V. (mL/kg) 225 (2.5)    Total Intake(mL/kg) 225 (2.5)      Urine (mL/kg/hr)     Total Output     Net +225         Urine Occurrence 1 x     Foley: 9/19>>  A: Hypokalemia>>improved P:   F/u and replace electrolytes as needed  GASTROINTESTINAL  Lab 06/12/12 0425 06/11/12 0017  AST 33 49*  ALT 24 34  ALKPHOS 91 93  BILITOT 0.5 0.1*  PROT 7.2 7.4  ALBUMIN 3.6 3.9    A: Nutrition. 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 P:   Thiamine, folic acid Heart healthy diet Monitor for GI upset while taking ASA and hx of ETOH>>continue protonix  HEMATOLOGIC  Lab 06/11/12 0046 06/11/12 0017  HGB 15.0 14.9  HCT 44.0 43.1  PLT -- 220  INR -- 0.99  APTT -- 32   A: No active issues P:  F/u CBC intermittently    ENDOCRINE  CBG (last 3)   A: Mild hyperglycemia.  HbA1C 5.8. P:   Monitor blood sugars on BMET Further assessment as outpt  NEUROLOGIC  MRI brain 9/19>>Multifocal areas of acute infarction in the right hemisphere MRA head 9/19>>Recent right internal carotid artery occlusion. Collaterals reconstitute the supraclinoid ICA.  Severe disease right A1 segment anterior cerebral artery.   A: CVA s/p tPA. Alcohol abuse. P:   CIWA assessment  q4h Start scheduled librium 50 mg q8h Continue prn ativan IV for CIWA > 8 Stroke evaluation per neurology  BEST PRACTICE / DISPOSITION Level of Care:  Floor status Primary Service:  Stroke Consultants:  PCCM Code Status: Full Diet:  Heart healthy DVT Px: SCD GI Px:  protonix   Sandrea Hughs, MD Pulmonary and Critical Care Medicine [facility] Cell [phone]     06/13/2012, 11:10 AM

## 2012-06-14 ENCOUNTER — Inpatient Hospital Stay (HOSPITAL_COMMUNITY): Payer: Medicaid Other

## 2012-06-14 DIAGNOSIS — F10239 Alcohol dependence with withdrawal, unspecified: Secondary | ICD-10-CM | POA: Diagnosis present

## 2012-06-14 DIAGNOSIS — I63239 Cerebral infarction due to unspecified occlusion or stenosis of unspecified carotid arteries: Secondary | ICD-10-CM

## 2012-06-14 MED ORDER — TECHNETIUM TC 99M SESTAMIBI GENERIC - CARDIOLITE
30.0000 | Freq: Once | INTRAVENOUS | Status: AC | PRN
  Administered 2012-06-14: 30 via INTRAVENOUS

## 2012-06-14 MED ORDER — REGADENOSON 0.4 MG/5ML IV SOLN
INTRAVENOUS | Status: AC
  Filled 2012-06-14: qty 5

## 2012-06-14 MED ORDER — LORAZEPAM 1 MG PO TABS
1.0000 mg | ORAL_TABLET | ORAL | Status: DC | PRN
  Administered 2012-06-15 – 2012-06-17 (×5): 1 mg via ORAL
  Filled 2012-06-14 (×5): qty 1

## 2012-06-14 MED ORDER — TECHNETIUM TC 99M SESTAMIBI GENERIC - CARDIOLITE
10.0000 | Freq: Once | INTRAVENOUS | Status: AC | PRN
  Administered 2012-06-14: 10 via INTRAVENOUS

## 2012-06-14 MED ORDER — FLEET ENEMA 7-19 GM/118ML RE ENEM: 1.0000 | ENEMA | Freq: Every day | RECTAL | Status: DC | PRN

## 2012-06-14 MED ORDER — REGADENOSON 0.4 MG/5ML IV SOLN
0.4000 mg | Freq: Once | INTRAVENOUS | Status: AC
  Administered 2012-06-14: 0.4 mg via INTRAVENOUS
  Filled 2012-06-14: qty 5

## 2012-06-14 NOTE — Progress Notes (Signed)
Pt CT angio of neck  result were call in and Dr  Amada Jupiter  Neuro on call was notified.  No new orders given. Pt vital signs  stable and neuro status unchanged from baseline. Will continue to monitor .

## 2012-06-14 NOTE — Consult Note (Signed)
History of Present Illness:  Patient is a 46 y.o. year old male who presents for evaluation of carotid stenosis.  Symptoms related to this stenosis include a recent right brain stroke with resultant dysarthria/left hemiparesis/left hemianesthesia.  The patient denies prior symptoms of TIA, amaurosis, or stroke.  The patient is currently on Plavix antiplatelet therapy.  He currently has no symptoms and feels he is at baseline.  He received TPA on admission.  Other medical problems include tobacco abuse, hyperlipidemia, alcohol abuse, CAD.  These are currently stable although he is on CIWA protolcol for DT prophylaxis.  Past Medical History  Diagnosis Date  . Alcohol abuse   . Pancreatitis     CT findings in May 2011 with inflammation and pseudocyst  . GERD (gastroesophageal reflux disease)   . CAD (coronary artery disease) 06/13/2012    Calcification noted on CTA of chest in 2012 Wall motion abnormality on ECHO    . 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 Hyperlipidemia     History reviewed. No pertinent past surgical history.   Social History History  Substance Use Topics  . Smoking status: Current Every Day Smoker -- 1.0 packs/day for 27 years    Types: Cigarettes  . Smokeless tobacco: Not on file   Comment: Patient states he cannot afford nicotine patches at this time  . Alcohol Use: 50.4 oz/week    84 Cans of beer per week     Pt is trying to quit    Family History Family History  Problem Relation Age of Onset  . Stroke Mother     deceased  . Coronary artery disease Mother   . Alcohol abuse Mother   . Cancer Mother   . Hypertension Father     alive  . Alcohol abuse Father   . Diabetes Father   . Kidney disease Father     Allergies  Allergies  Allergen Reactions  . Other Swelling    Zennioptical - Delirium     Current Facility-Administered Medications  Medication Dose Route Frequency Provider Last Rate Last Dose  . acetaminophen (TYLENOL) tablet 650 mg  650  mg Oral Q4H PRN Ritta Slot, MD   650 mg at 06/11/12 0347   Or  . acetaminophen (TYLENOL) suppository 650 mg  650 mg Rectal Q4H PRN Ritta Slot, MD      . aspirin EC tablet 81 mg  81 mg Oral Daily Cathlyn Parsons, PA-C   81 mg at 06/14/12 1149  . atorvastatin (LIPITOR) tablet 80 mg  80 mg Oral q1800 Tara C Jernejcic, PA   80 mg at 06/13/12 2147  . cephALEXin (KEFLEX) capsule 500 mg  500 mg Oral Q12H Tara C Jernejcic, PA   500 mg at 06/13/12 2147  . chlordiazePOXIDE (LIBRIUM) capsule 50 mg  50 mg Oral Q8H Coralyn Helling, MD   50 mg at 06/14/12 0543  . clopidogrel (PLAVIX) tablet 75 mg  75 mg Oral QAC breakfast Cathlyn Parsons, PA-C   75 mg at 06/14/12 1149  . folic acid (FOLVITE) tablet 1 mg  1 mg Oral Daily Ritta Slot, MD   1 mg at 06/14/12 1149  . iohexol (OMNIPAQUE) 350 MG/ML injection 50 mL  50 mL Intravenous Once PRN Medication Radiologist, MD   50 mL at 06/13/12 1725  . labetalol (NORMODYNE,TRANDATE) injection 10 mg  10 mg Intravenous Q10 min PRN Ritta Slot, MD   10 mg at 06/11/12 1626  . LORazepam (ATIVAN) injection 1-4 mg  1-4  mg Intravenous Q1H PRN Coralyn Helling, MD   1 mg at 06/12/12 2012  . multivitamin with minerals tablet 1 tablet  1 tablet Oral Daily Ritta Slot, MD   1 tablet at 06/14/12 1149  . nicotine (NICODERM CQ - dosed in mg/24 hours) patch 21 mg  21 mg Transdermal Daily Ritta Slot, MD   21 mg at 06/14/12 1149  . ondansetron (ZOFRAN) injection 4 mg  4 mg Intravenous Q6H PRN Ritta Slot, MD   4 mg at 06/11/12 0600  . pantoprazole (PROTONIX) EC tablet 40 mg  40 mg Oral Q1200 Coralyn Helling, MD   40 mg at 06/14/12 1149  . regadenoson (LEXISCAN) 0.4 MG/5ML injection SOLN           . regadenoson (LEXISCAN) injection SOLN 0.4 mg  0.4 mg Intravenous Once Simonne Come, MD   0.4 mg at 06/14/12 1014  . technetium sestamibi generic (CARDIOLITE) injection 10 milli Curie  10 milli Curie Intravenous Once PRN Medication Radiologist, MD   10 milli  Curie at 06/14/12 [phone]  . technetium sestamibi generic (CARDIOLITE) injection 30 milli Curie  30 milli Curie Intravenous Once PRN Medication Radiologist, MD   30 milli Curie at 06/14/12 1015  . thiamine (VITAMIN B-1) tablet 100 mg  100 mg Oral Daily Ritta Slot, MD   100 mg at 06/14/12 1149  . DISCONTD: 0.9 %  sodium chloride infusion   Intravenous Continuous Vida Roller, MD 75 mL/hr at 06/12/12 0602    . DISCONTD: 0.9 %  sodium chloride infusion   Intravenous Continuous Ritta Slot, MD 75 mL/hr at 06/11/12 0145      ROS:   General:  No weight loss, Fever, chills  HEENT: No recent headaches, no nasal bleeding, no visual changes, no sore throat  Neurologic: No dizziness, blackouts, seizures. No recent symptoms of stroke or mini- stroke. No recent episodes of slurred speech, or temporary blindness.  Cardiac: No recent episodes of chest pain/pressure, no shortness of breath at rest.  No shortness of breath with exertion.  Denies history of atrial fibrillation or irregular heartbeat  Vascular: No history of rest pain in feet.  No history of claudication.  No history of non-healing ulcer, No history of DVT   Pulmonary: No home oxygen, no productive cough, no hemoptysis,  No asthma or wheezing  Musculoskeletal:  [ ]  Arthritis, [x ] Low back pain,  [ ]  Joint pain  Hematologic:No history of hypercoagulable state.  No history of easy bleeding.  No history of anemia  Gastrointestinal: No hematochezia or melena,  No gastroesophageal reflux, no trouble swallowing  Urinary: [ ]  chronic Kidney disease, [ ]  on HD - [ ]  MWF or [ ]  TTHS, [ ]  Burning with urination, [ ]  Frequent urination, [ ]  Difficulty urinating;   Skin: No rashes  Psychological: No history of anxiety,  No history of depression   Physical Examination  Filed Vitals:   06/14/12 1013 06/14/12 1014 06/14/12 1016 06/14/12 1018  BP: 116/74 118/86 112/77 116/79  Pulse: 77 93 99 96  Temp:      TempSrc:      Resp:       Height:      Weight:      SpO2:        Body mass index is 26.06 kg/(m^2).  General:  Alert and oriented, no acute distress HEENT: Normal Neck: No bruit or JVD Pulmonary: Clear to auscultation bilaterally Cardiac: Regular Rate and Rhythm without murmur Gastrointestinal: Soft, non-tender, non-distended, no  mass Skin: No rash Extremity Pulses:  2+ radial, brachial, femoral, dorsalis pedis, posterior tibial pulses bilaterally Musculoskeletal: No deformity or edema  Neurologic: Upper and lower extremity motor 5/5 and symmetric, no obvious sensory or cranial nerve deficit, gait normal  DATA: MRA brain images reviewed showing occlusion of ICA all the way to MCA branching, multiple parietal area infarcts on Right, no bleed, right vertebral occlusion, patent left  CTA neck images reviewed showing subtotal occlusion of Right ICA with reconstitution of ICA 3mm vessel but the vessel becomes quite small distal to this, Right vertebral occlusion, patent left, 60% left ICA stenosis  Duplex ultrasound pre occlusive thump on R, 60% left ICA   ASSESSMENT: Subtotal occlusion of Right ICA, MRA suggests that the distal ICA is occluded however this may have changed post thrombolysis.     PLAN:  Needs carotid angio to further define anatomy to determine if CEA is feasible Will make NPO p midnight Consent for carotid angio tomorrow  Fabienne Bruns, MD [facility] Office: [phone] Pager: [phone]    Fabienne Bruns, MD [facility] [place] Office: [phone] Pager: [phone]

## 2012-06-14 NOTE — Progress Notes (Signed)
Lexiscan cardiolite done without complications.  Full report to follow.  Darden Palmer MD [facility]

## 2012-06-14 NOTE — Progress Notes (Signed)
History: Brandon [name] is an 46 y.o. male with a history of smoking who was in his normal state at 10:45 06/10/2012 as he watching TV with his wife. At 34, his wife noticed facial drooping and slurred speech and called 911. Since that time, he has continued to have slurred speech, and has noticed that his left arm and leg are numb. He states that his arm has been going "in and out" on him. He does not take any daily antiplatelet medicine. He states that he checked his blood pressure last week and it was 120 systolic. He does have a history of alcohol abuse, consuming 7-10 beers per night, and tonight he feels that he has been on the low side, around 7. Patient was a TPA candidate. He was admitted to the neuro ICU for further evaluation and treatment.  LSN:10:45 06/10/2012 tPA Given: yes  Subjective: Currently, patient denies slurred speech, difficulties swallowing, VA disturbance, dizziness, weakness or numbness of extremities. NPO for lexiscan. Presently, patient denies anxiety or agitation.  Objective: BP 153/97  Pulse 83  Temp 98.3 F (36.8 C) (Oral)  Resp 20  Ht 6\' 1"  (1.854 m)  Wt 89.6 kg (197 lb 8.5 oz)  BMI 26.06 kg/m2  SpO2 98%  Diet: Cardiac  Activity: up with assist DVT Prophylaxis: SCDs ordered  Medications: Scheduled:    . aspirin EC  81 mg Oral Daily  . atorvastatin  80 mg Oral q1800  . cephALEXin  500 mg Oral Q12H  . chlordiazePOXIDE  50 mg Oral Q8H  . clopidogrel  75 mg Oral QAC breakfast  . folic acid  1 mg Oral Daily  . multivitamin with minerals  1 tablet Oral Daily  . nicotine  21 mg Transdermal Daily  . pantoprazole  40 mg Oral Q1200  . thiamine  100 mg Oral Daily  . DISCONTD: atorvastatin  10 mg Oral q1800  . DISCONTD: atorvastatin  20 mg Oral q1800   Neurologic Exam: Mental Status: Alert, oriented, thought content appropriate.  Speech fluent without evidence of aphasia. Able to follow 3 step commands without difficulty. Cranial Nerves: II- Visual  fields grossly intact. III/IV/VI-Extraocular movements intact.  Pupils reactive bilaterally. V/VII-minimal left upper and lower facial droop. VIII-hearing grossly intact IX/X-normal gag XI-bilateral shoulder shrug XII-midline tongue extension Motor: 5/5 bilaterally with normal tone and bulk.diminished fine finger movements on left and orbits right over left upper extremities. Sensory: Light touch intact throughout, bilaterally Deep Tendon Reflexes: 2+ and symmetric throughout Plantars: Downgoing bilaterally Cerebellar: Normal finger-to-nose, normal rapid alternating movements and normal heel-to-shin test.   Physical exam: Lungs with decreased air movement throughout; no rhonchi or wheeze. Cor RRR without M/G/R Extremities:  Decreased erythema, warmth and induration forearm cellulitis bilaterally.  Lab Results: Basic Metabolic Panel:  Lab 06/12/12 9629 06/11/12 0046 06/11/12 0017  NA 137 141 --  K 3.5 3.4* --  CL 97 103 --  CO2 26 -- 24  GLUCOSE 150* 117* --  BUN 9 5* --  CREATININE 0.62 1.00 --  CALCIUM 10.1 -- 9.6  MG 2.1 -- --  PHOS 4.0 -- --   Liver Function Tests:  Lab 06/12/12 0425 06/11/12 0017  AST 33 49*  ALT 24 34  ALKPHOS 91 93  BILITOT 0.5 0.1*  PROT 7.2 7.4  ALBUMIN 3.6 3.9   CBC:  Lab 06/11/12 0046 06/11/12 0017  WBC -- 8.3  NEUTROABS -- 4.5  HGB 15.0 14.9  HCT 44.0 43.1  MCV -- 101.7*  PLT -- 220  Hemoglobin A1C:  Lab 06/11/12 0425  HGBA1C 5.8*   Fasting Lipid Panel:  Lab 06/11/12 0425  CHOL 207*  HDL 84  LDLCALC 110*  TRIG 66  CHOLHDL 2.5  LDLDIRECT --   Coagulation:  Lab 06/11/12 0017  LABPROT 13.0  INR 0.99   Urine Drug Screen: Drugs of Abuse     Component Value Date/Time   LABOPIA NONE DETECTED 06/11/2012 1126   COCAINSCRNUR NONE DETECTED 06/11/2012 1126   LABBENZ NONE DETECTED 06/11/2012 1126   AMPHETMU NONE DETECTED 06/11/2012 1126   THCU NONE DETECTED 06/11/2012 1126   LABBARB NONE DETECTED 06/11/2012 1126    Alcohol  Level:  Lab 06/11/12 0017  ETH 199*    Study Results:   06/13/2012  CT ANGIOGRAPHY NECK   Findings:  Artifact extends through the right common carotid artery.  At the level of the right carotid bifurcation prominent plaque with a string sign of the right internal carotid artery beyond this region.  1.8 cm above its origin, there is a thread- like appearance of the right internal carotid.  Left carotid bifurcation prominent calcified plaque with 58% diameter stenosis proximal left internal carotid artery.  Right vertebral artery is occluded throughout majority of its course with reconstitution of flow at the C3 level with diminutive size distal right vertebral artery.  No significant stenosis of the left vertebral artery or left subclavian artery.  Motion artifact limits evaluation of the glottic and supraglottic region.  No obvious mass identified.  Dental caries.  Cervical spondylotic changes.   Review of the MIP images confirms the above findings.   IMPRESSION: Almost complete occlusion of the right internal carotid artery with thread like appearance 1.8 cm above its origin with a string like appearance beyond this region into the level of the level of the cavernous sinus.  58% diameter stenosis proximal left internal carotid artery.  Occluded right vertebral artery with reconstitution of flow at the C3 level.  Fuller Canada, M.D.    06/11/2012  MRI HEAD  Findings:  The patient had difficulty remaining motionless for the study.  Images are suboptimal.  Small or subtle lesions could be overlooked.  Multifocal areas of acute subcentimeter cortical subcortical infarction are seen in the right hemisphere (circle annotation); these involve the insular cortex, posterior temporal and parietal cortex, and periventricular white matter.  Gradient sequence shows no acute or chronic hemorrhage.  There is advanced atrophy with chronic microvascular ischemic change.  Absent flow void right internal carotid artery,  discussed below.  Ventriculomegaly without signs of normal pressure hydrocephalus.  Intact calvarium.  No acute sinus, orbital, or mastoid abnormality  IMPRESSION: Multifocal areas of acute infarction in the right hemisphere.  This could represent a shower of emboli or possibly  hypoperfusion.  Elsie Stain, M.D.   06/11/2012 MRA HEAD  Findings: There is absent flow related enhancement in the right internal carotid artery consistent with recent occlusion. Reconstitution is suspected via collaterals from posterior circulation as well as retrograde flow in the ophthalmic.  The right MCA is patent but with less robust flow related enhancement of the left MCA.  There is severe proximal disease of the A1 segment right anterior cerebral artery.  The basilar artery is widely patent with left vertebral dominant.  Right vertebral is hypoplastic.  Mild to moderate irregularity distal MCA and PCA branches consistent with an atherosclerotic change. Negative for aneurysm.   IMPRESSION: Findings consistent with recent right internal carotid artery occlusion.  Collaterals reconstitute the supraclinoid ICA.  Severe disease right A1 segment anterior cerebral artery.  Elsie Stain, M.D.    06/11/12 2D Echocardiogram  Left ventricle: There are some unusual wall motion abnormalities. There is hypokinesis at the base of the anterior septum. There is hypokinesis of the mid inferiorwall and mid inferior septum. The cavity size was mildly dilated. Wall thickness was increased in a pattern of mild LVH. The estimated ejection fraction was 55%. - Impressions: No cardiac source of embolism was identified, but cannot be ruled out on the basis of this examination.  06/11/12 Carotid Doppler  Right - Heavily calcified ICA with diminished flow proximally. There is a pre occlusion "thumping" in the mid to distal region. Vertebral artery flow is antegrade.  Left - Moderate calcification of the ICA with no obvious evidence of significant  stenosis. Acoustic shadowing noted in the proximal region which may obscure higher velocities. Vertebral artery flow is antegrade  Therapies: ZO:XWRUEAV with slight weakness LUE>LLE. Able to move fairly well, however very impulsive with decreased safety/judgement. Patient will benefit from acute PT for mobility/gait training, balance training, and education. Patient may benefit from HHPT for balance therapy at discharge.   WUJ:[id number] with decreased clarity of thinking, difficulty with recall of verbal information, decreased verbal organization. Overall with improvements after administration of tPa; dysarthria has resolved.  Assessment/Plan: Brandon [name] is a 46 y.o. 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  Status post IV t-PA 06/11/2012 started at 0037.  On no antiplatlets prior to admission. Now on ECASA 81mg  plus Plavix 75 mg daily for secondary stroke prevention.  Echo shows multiple wall motion abnormalities concerning for embolic potential as well as undiagnosed CAD. Less erythema and induration cellulitis forearms. etoh abuse, on CIWA protocol, UDS negative  Hx pancreatitis May 2011  GERD  Cigarette smoker, 1 PPD Hypertension  Hyperlipidemia, LDL 110, now on statin PT  Carotid disease  Plan: 1.Lexiscan cardiac assessment today.  Appreciate cards input. 2.Keflex Day 2/7 for cellulitis. 3.Plavix 75mg  plus baby asa 81mg  daily for secondary stroke prevention, use combination for 3 mos then plavix alone lifelong 4. Continue CIWA protocol- Dr. Sherene Sires following. 5. strict control of hypertension with blood pressure goal below 180/110 6. Polysubstance abuse counselling - talked with patient and significant other extensively about the risks of ongoing tobacco and alcohol abuse. Nicotine patch in place. 7. Aggressive Risk Factor Modification. 8.  Vascular Surgery Consult- spoke with Dr. Darrick Penna by phone.  He will see in consult..    LOS: 3 days   Marya Fossa PA-C [facility] [phone] 06/14/2012  8:50 AM  I have supervised this assistant, reviewed data and participated in developing plan of care Delia Heady, MD Medical Director [facility] Pager: [phone] 06/14/2012 2:56 PM

## 2012-06-14 NOTE — Progress Notes (Signed)
Name: Brandon [name] MRN: [id number] DOB: 10-13-1965    LOS: 3  Referring Provider:  Pearlean Brownie Reason for Referral:  Alcohol abuse/ ? Dt risk  PULMONARY / CRITICAL CARE MEDICINE  HPI:  46 yo male smoker admitted 06/11/2012 with slurred speech, and left sided numbness likely from acute CVA, and was given tPA.  He reports hx of heavy alcohol use (7-10 beers per day).  PCCM consulted 9/19 to assist with management with hx of ETOH.  Events Since Admission: 9/19 Admitted CVA s/p tPA  Current Status: Poor sleep but no obvious dts with min tremulous, no tachycardia  Vital Signs: Temp:  [97.6 F (36.4 C)-98.3 F (36.8 C)] 97.6 F (36.4 C) (09/22 0927) Pulse Rate:  [83-96] 85  (09/22 0927) Resp:  [18-20] 20  (09/22 0927) BP: (108-153)/(67-97) 108/80 mmHg (09/22 0927) SpO2:  [98 %-100 %] 100 % (09/22 0927)  Physical Examination: General - no distress HEENT - no sinus tenderness, no oral exudate, no LAN Cardiac - s1s2 regular, no murmur Chest - no wheeze/rales Abd - soft, mild mid-epigastric tenderness, no organomegaly, +bowel sounds Ext - no edema Neuro - normal strength, speech fluent, slight tremor Psych - normal mood, behavior   ASSESSMENT AND PLAN  PULMONARY  A: Hx of tobacco abuse P:   Smoking cessation Oxygen as needed to keep SpO2 > 92% Nicotine patch  CARDIOVASCULAR  Lab 06/11/12 0017  TROPONINI <0.30  LATICACIDVEN --  PROBNP --      Echo 9/19>>Hypokinesis base anterior septum, mid inferior wall, and mid inferior septum, mild LVH, EF 55%   A: Hypertension>>improved P:  PRN labetalol per stroke team; goal SBP < 180 and DBP < 105  RENAL  Lab 06/12/12 0425 06/11/12 0046 06/11/12 0017  NA 137 141 140  K 3.5 3.4* --  CL 97 103 100  CO2 26 -- 24  BUN 9 5* 7  CREATININE 0.62 1.00 0.68  CALCIUM 10.1 -- 9.6  MG 2.1 -- --  PHOS 4.0 -- --   Intake/Output      09/21 0701 - 09/22 0700 09/22 0701 - 09/23 0700   I.V. (mL/kg)     Total Intake(mL/kg)     Net            Foley: 9/19>>  A: Hypokalemia>>improved P:   F/u and replace electrolytes as needed  GASTROINTESTINAL  Lab 06/12/12 0425 06/11/12 0017  AST 33 49*  ALT 24 34  ALKPHOS 91 93  BILITOT 0.5 0.1*  PROT 7.2 7.4  ALBUMIN 3.6 3.9    A: Nutrition. 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 P:   Thiamine, folic acid Heart healthy diet Monitor for GI upset while taking ASA and hx of ETOH>>continue protonix  HEMATOLOGIC  Lab 06/11/12 0046 06/11/12 0017  HGB 15.0 14.9  HCT 44.0 43.1  PLT -- 220  INR -- 0.99  APTT -- 32   A: No active issues P:  F/u CBC intermittently    ENDOCRINE  CBG (last 3)   A: Mild hyperglycemia.  HbA1C 5.8. P:   Monitor blood sugars on BMET Further assessment as outpt  NEUROLOGIC  MRI brain 9/19>>Multifocal areas of acute infarction in the right hemisphere MRA head 9/19>>Recent right internal carotid artery occlusion. Collaterals reconstitute the supraclinoid ICA.  Severe disease right A1 segment anterior cerebral artery.   A: CVA s/p tPA. Alcohol abuse. P:   CIWA assessment q4h on scheduled librium 50 mg q8h Continue prn ativan IV for CIWA > 8 Stroke evaluation per neurology  BEST PRACTICE / DISPOSITION Level of Care:  Floor status Primary Service:  Stroke Consultants:  PCCM Code Status: Full Diet:  Heart healthy DVT Px: SCD GI Px:  protonix   Sandrea Hughs, MD Pulmonary and Critical Care Medicine [facility] Cell [phone]     06/14/2012, 9:51 AM

## 2012-06-15 ENCOUNTER — Encounter (HOSPITAL_COMMUNITY)
Admission: EM | Disposition: A | Discharge status: Home / Self Care | Payer: Self-pay | Source: Home / Self Care | Attending: Neurology

## 2012-06-15 DIAGNOSIS — I6529 Occlusion and stenosis of unspecified carotid artery: Secondary | ICD-10-CM

## 2012-06-15 DIAGNOSIS — I6523 Occlusion and stenosis of bilateral carotid arteries: Secondary | ICD-10-CM | POA: Diagnosis present

## 2012-06-15 HISTORY — PX: CAROTID ANGIOGRAM: SHX5504

## 2012-06-15 LAB — CBC
Hemoglobin: 14.9 g/dL (ref 13.0–17.0)
Platelets: 189 10*3/uL (ref 150–400)
RBC: 4.28 MIL/uL (ref 4.22–5.81)
WBC: 9.1 10*3/uL (ref 4.0–10.5)

## 2012-06-15 LAB — BASIC METABOLIC PANEL
CO2: 24 mEq/L (ref 19–32)
Chloride: 102 mEq/L (ref 96–112)
Glucose, Bld: 156 mg/dL — ABNORMAL HIGH (ref 70–99)
Sodium: 138 mEq/L (ref 135–145)

## 2012-06-15 LAB — GLUCOSE, CAPILLARY: Glucose-Capillary: 127 mg/dL — ABNORMAL HIGH (ref 70–99)

## 2012-06-15 SURGERY — CAROTID ANGIOGRAM
Anesthesia: LOCAL

## 2012-06-15 MED ORDER — ONDANSETRON HCL 4 MG/2ML IJ SOLN
4.0000 mg | Freq: Four times a day (QID) | INTRAMUSCULAR | Status: DC | PRN
  Administered 2012-06-15 – 2012-06-16 (×3): 4 mg via INTRAVENOUS
  Filled 2012-06-15 (×3): qty 2

## 2012-06-15 MED ORDER — ACETAMINOPHEN 325 MG PO TABS: 650.0000 mg | ORAL_TABLET | ORAL | Status: DC | PRN

## 2012-06-15 MED ORDER — ACETAMINOPHEN 325 MG PO TABS
650.0000 mg | ORAL_TABLET | ORAL | Status: DC | PRN
  Administered 2012-06-16 – 2012-06-17 (×3): 650 mg via ORAL
  Filled 2012-06-15 (×3): qty 2

## 2012-06-15 MED ORDER — SODIUM CHLORIDE 0.9 % IV SOLN
1.0000 mL/kg/h | INTRAVENOUS | Status: DC
  Administered 2012-06-15: 1 mL/kg/h via INTRAVENOUS

## 2012-06-15 MED ORDER — CHLORDIAZEPOXIDE HCL 25 MG PO CAPS
50.0000 mg | ORAL_CAPSULE | Freq: Two times a day (BID) | ORAL | Status: DC
  Administered 2012-06-15 – 2012-06-16 (×2): 50 mg via ORAL
  Filled 2012-06-15 (×2): qty 2

## 2012-06-15 NOTE — Progress Notes (Signed)
Physical Therapy Treatment Patient Details Name: JAYDIAN SANTANA MRN: [id number] DOB: 1966/07/25 Today's Date: 06/15/2012 Time: 1003-1020 PT Time Calculation (min): 17 min  PT Assessment / Plan / Recommendation Comments on Treatment Session  Mr. Camille still demonstrating impulsivity and decreased safety awareness today with higher level balance activities. Educated pt on his risk of falls and how he would benefit from outpatient physical therapy for f/u with balance although unsure of his receptiveness. Educated pt on s/s of CVA and his increased risk.     Follow Up Recommendations  Outpatient PT;Supervision for mobility/OOB    Barriers to Discharge        Equipment Recommendations  None recommended by PT    Recommendations for Other Services    Frequency Min 4X/week   Plan Discharge plan needs to be updated;Frequency remains appropriate    Precautions / Restrictions Precautions Precautions: Fall Precaution Comments: impulsivity noted with higher level balance activities Restrictions Weight Bearing Restrictions: No    Mobility  Bed Mobility Bed Mobility: Supine to Sit;Sit to Supine Supine to Sit: 7: Independent Sitting - Scoot to Edge of Bed: 7: Independent Sit to Supine: 7: Independent Transfers Transfers: Sit to Stand;Stand to Sit Sit to Stand: 5: Supervision;With upper extremity assist Stand to Sit: 5: Supervision;With upper extremity assist Details for Transfer Assistance: pt very impulsively standing, no LOB noted but pt impatient with therapist and wanting to proceed to get the session overwith Ambulation/Gait Ambulation/Gait Assistance: 4: Min guard Ambulation Distance (Feet): 400 Feet Assistive device: None Ambulation/Gait Assistance Details: mingaurdA for safety especially with higher challenging activities see balance section Gait velocity: slower speed (not timed) but able to adjust speed as needed with challenges General Gait Details: flexed trunk rotated  slightly to the right Stairs: Yes Stairs Assistance: 4: Min guard Stairs Assistance Details (indicate cue type and reason): impulsive with steps, pt with inconsistent pattern skipping the middle step and then tripped needing to catch himself, very quickly turning around on small step with LOB Stair Management Technique: One rail Right;Alternating pattern Number of Stairs: 10       PT Goals Acute Rehab PT Goals PT Goal: Sit to Stand - Progress: Progressing toward goal PT Goal: Stand to Sit - Progress: Progressing toward goal PT Goal: Ambulate - Progress: Progressing toward goal PT Goal: Up/Down Stairs - Progress: Progressing toward goal Additional Goals PT Goal: Additional Goal #1 - Progress: Progressing toward goal  Visit Information  Last PT Received On: 06/15/12 Assistance Needed: +1    Subjective Data  Subjective: I'd really like to sleep since I don't get to eat anything until 9 o'clock tonight.    Cognition  Overall Cognitive Status: Impaired Area of Impairment: Attention Arousal/Alertness: Awake/alert Orientation Level: Appears intact for tasks assessed Behavior During Session: Agitated Current Attention Level: Selective;Alternating Safety/Judgement: Impulsive;Decreased safety judgement for tasks assessed Cognition - Other Comments: rushing through session today even with more challenging activities (stairs, pt stubbed foot on step and not taking appropriate time needed for safe technique), pt also very frustrated with not being able to eat and withdrawing from nicotine    Balance  Static Standing Balance Rhomberg - Eyes Opened: 60  Rhomberg - Eyes Closed: 30  (slight increase in sway) Dynamic Standing Balance Dynamic Standing - Balance Support: No upper extremity supported Dynamic Standing - Level of Assistance: 4: Min assist;5: Stand by assistance Dynamic Standing - Comments: reciprocal toe tap to elevated step, pt impulsive with decreased stability, increasing his  speed even when he is unsteady  Standardized Balance Assessment Standardized Balance Assessment: Dynamic Gait Index Dynamic Gait Index Level Surface: Mild Impairment Change in Gait Speed: Normal Gait with Horizontal Head Turns: Mild Impairment Gait with Vertical Head Turns: Mild Impairment Gait and Pivot Turn: Mild Impairment Step Over Obstacle: Moderate Impairment Step Around Obstacles: Mild Impairment Steps: Severe Impairment Total Score: 14   End of Session PT - End of Session Equipment Utilized During Treatment: Gait belt Activity Tolerance: Patient tolerated treatment well Patient left: in bed;with call bell/phone within reach Nurse Communication: Mobility status   GP     [facility] HELEN 06/15/2012, 11:27 AM

## 2012-06-15 NOTE — Progress Notes (Signed)
Subjective:  No C/o SOB or chest pain.  Objective:  Vital Signs in the last 24 hours: BP 157/93  Pulse 98  Temp 97.9 F (36.6 C) (Oral)  Resp 20  Ht 6\' 1"  (1.854 m)  Wt 89.6 kg (197 lb 8.5 oz)  BMI 26.06 kg/m2  SpO2 99%  Physical Exam: WM in NAD Lungs:  Clear to A&P Cardiac:  Regular rhythm, normal S1 and S2, no S3 Extremities:  No edema present  Intake/Output from previous day: 09/22 0701 - 09/23 0700 In: 360 [P.O.:360] Out: -  Weight Filed Weights   06/11/12 0024 06/11/12 0145  Weight: 91 kg (200 lb 9.9 oz) 89.6 kg (197 lb 8.5 oz)    Lab Results: Basic Metabolic Panel:  Basename 06/15/12 0543  NA 138  K 3.7  CL 102  CO2 24  GLUCOSE 156*  BUN 8  CREATININE 0.58    CBC:  Basename 06/15/12 0543  WBC 9.1  NEUTROABS --  HGB 14.9  HCT 43.4  MCV 101.4*  PLT 189   Telemetry: Sinus rhythm  Cardiolite:  No ischemia EF 51%  Assessment/Plan:  1. Recent right MCA occlusion and CVA 2. 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 Carotid artery disease.  Rec:  CVA was secondary to carotid artery disease and not embolic from heart.  No evidence of ischemia now and should be treated medically.   He will need intense risk factor modification including treatment of cholesterol with LDL goal of <70, add ace inhibitor for cardioprotection and d/c smoking and alcohol.  He will need good BP control when stable from CVA viewpoint.  Currently undergoing eval to see if candidate for surgical carotid revascularization.     Darden Palmer  MD [facility]  06/15/2012, 10:02 AM

## 2012-06-15 NOTE — Progress Notes (Signed)
History: Brandon [name] is an 46 y.o. male with a history of smoking who was in his normal state at 10:45 06/10/2012 as he watching TV with his wife. At 17, his wife noticed facial drooping and slurred speech and called 911. Since that time, he has continued to have slurred speech, and has noticed that his left arm and leg are numb. He states that his arm has been going "in and out" on him. He does not take any daily antiplatelet medicine. He states that he checked his blood pressure last week and it was 120 systolic. He does have a history of alcohol abuse, consuming 7-10 beers per night, and tonight he feels that he has been on the low side, around 7. Patient was a TPA candidate. He was admitted to the neuro ICU for further evaluation and treatment.  LSN:10:45 06/10/2012 tPA Given: yes  Subjective: In bed on the way to angio.  Objective: BP 108/77  Pulse 90  Temp 98 F (36.7 C) (Oral)  Resp 18  Ht 6\' 1"  (1.854 m)  Wt 89.6 kg (197 lb 8.5 oz)  BMI 26.06 kg/m2  SpO2 98%  Diet: NPO Activity: up with assist DVT Prophylaxis: SCDs ordered  Medications: Scheduled:    . aspirin EC  81 mg Oral Daily  . atorvastatin  80 mg Oral q1800  . cephALEXin  500 mg Oral Q12H  . chlordiazePOXIDE  50 mg Oral Q12H  . clopidogrel  75 mg Oral QAC breakfast  . folic acid  1 mg Oral Daily  . multivitamin with minerals  1 tablet Oral Daily  . nicotine  21 mg Transdermal Daily  . pantoprazole  40 mg Oral Q1200  . regadenoson      . thiamine  100 mg Oral Daily  . DISCONTD: chlordiazePOXIDE  50 mg Oral Q8H   Neurologic Exam: Mental Status: Alert, oriented, thought content appropriate.  Speech fluent without evidence of aphasia. Able to follow 3 step commands without difficulty. Cranial Nerves: II- Visual fields grossly intact. III/IV/VI-Extraocular movements intact.  Pupils reactive bilaterally. V/VII-minimal left upper and lower facial droop. VIII-hearing grossly intact IX/X-normal  gag XI-bilateral shoulder shrug XII-midline tongue extension Motor: 5/5 bilaterally with normal tone and bulk.diminished fine finger movements on left and orbits right over left upper extremities. Sensory: Light touch intact throughout, bilaterally Deep Tendon Reflexes: 2+ and symmetric throughout Plantars: Downgoing bilaterally Cerebellar: Normal finger-to-nose, normal rapid alternating movements and normal heel-to-shin test.   Physical exam: Lungs with decreased air movement throughout; no rhonchi or wheeze. Cor RRR without M/G/R Extremities: has saline locked IV's anterior forearms.  Both have local swelling and induration with mild tenderness and increased warmth.  The left has a palpable cord vs induration and red streak towards the antecubital fossa.  Lab Results: Basic Metabolic Panel:  Lab 06/15/12 1610 06/12/12 0425  NA 138 137  K 3.7 3.5  CL 102 97  CO2 24 26  GLUCOSE 156* 150*  BUN 8 9  CREATININE 0.58 0.62  CALCIUM 9.6 10.1  MG -- 2.1  PHOS -- 4.0   Liver Function Tests:  Lab 06/12/12 0425 06/11/12 0017  AST 33 49*  ALT 24 34  ALKPHOS 91 93  BILITOT 0.5 0.1*  PROT 7.2 7.4  ALBUMIN 3.6 3.9   CBC:  Lab 06/15/12 0543 06/11/12 0046 06/11/12 0017  WBC 9.1 -- 8.3  NEUTROABS -- -- 4.5  HGB 14.9 15.0 --  HCT 43.4 44.0 --  MCV 101.4* -- 101.7*  PLT 189 --  220   Hemoglobin A1C:  Lab 06/11/12 0425  HGBA1C 5.8*   Fasting Lipid Panel:  Lab 06/11/12 0425  CHOL 207*  HDL 84  LDLCALC 110*  TRIG 66  CHOLHDL 2.5  LDLDIRECT --   Coagulation:  Lab 06/11/12 0017  LABPROT 13.0  INR 0.99   Urine Drug Screen: Drugs of Abuse     Component Value Date/Time   LABOPIA NONE DETECTED 06/11/2012 1126   COCAINSCRNUR NONE DETECTED 06/11/2012 1126   LABBENZ NONE DETECTED 06/11/2012 1126   AMPHETMU NONE DETECTED 06/11/2012 1126   THCU NONE DETECTED 06/11/2012 1126   LABBARB NONE DETECTED 06/11/2012 1126    Alcohol Level:  Lab 06/11/12 0017  ETH 199*    Study  Results:  CT of the brain 06/11/2012 No acute finding. Age advanced atrophy.   MRI HEAD  06/11/2012  Multifocal areas of acute infarction in the right hemisphere.  This could represent a shower of emboli or possibly  hypoperfusion.    MRA HEAD  06/11/2012  Findings consistent with recent right internal carotid artery occlusion.  Collaterals reconstitute the supraclinoid ICA.  Severe disease right A1 segment anterior cerebral artery.    CT angio of the neck 9/21/213 Almost complete occlusion of the right internal carotid artery with thread like appearance 1.8 cm above its origin with a string like appearance beyond this region into the level of the level of the cavernous sinus. 58% diameter stenosis proximal left internal carotid artery. Occluded right vertebral artery with reconstitution of flow at the C3 level.  2D Echocardiogram  06/11/12   Left ventricle: There are some unusual wall motion abnormalities. There is hypokinesis at the base of the anterior septum. There is hypokinesis of the mid inferiorwall and mid inferior septum. The cavity size was mildly dilated. Wall thickness was increased in a pattern of mild LVH. The estimated ejection fraction was 55%. - Impressions: No cardiac source of embolism was identified, but cannot be ruled out on the basis of this examination.  Carotid Doppler 06/11/12  Right - Heavily calcified ICA with diminished flow proximally. There is a pre occlusion "thumping" in the mid to distal region. Vertebral artery flow is antegrade.  Left - Moderate calcification of the ICA with no obvious evidence of significant stenosis. Acoustic shadowing noted in the proximal region which may obscure higher velocities. Vertebral artery flow is antegrade  CXR 06/11/2012 Mild basilar atelectasis. Stable mild cardiomegaly.   Nuclear Myocar Multi w/ spect w/wall motion 06/14/2012  1. Minimal attenuation of the inferior wall extending towards the apex. No definite scintigraphic evidence of  prior infarction or pharmacologically induced ischemia. 2. Minimal hypokinesia involving the basilar portion of the inferior wall and septum. Slightly decreased ejection fraction - 51%.  Therapies: BM:WUXLKGM with slight weakness LUE>LLE. Able to move fairly well, however very impulsive with decreased safety/judgement. Patient will benefit from acute PT for mobility/gait training, balance training, and education. Patient may benefit from HHPT for balance therapy at discharge.  WNU:UVOZDGUY with decreased clarity of thinking, difficulty with recall of verbal information, decreased verbal organization. Overall with improvements after administration of tPa; dysarthria has resolved.  Assessment/Plan: [name] is a 46 y.o. 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  Status post IV t-PA 06/11/2012 started at 0037.  On no antiplatlets prior to admission. Now on ECASA 81mg  plus Plavix 75 mg daily for secondary stroke prevention.  Echo shows multiple wall motion abnormalities concerning for embolic potential as well as undiagnosed CAD. etoh abuse, on CIWA protocol, UDS negative  Hx pancreatitis May 2011, chronic pancreatitis d/t etoh abuse  GERD  Cigarette smoker, 1 PPD Hypertension  Hyperlipidemia, LDL 110, now on lipitor 80 mg daily Carotid disease, R ICA  Cellulitis at catheter insertion site. On Keflex 500 mg BID x 7 days for cellulitis. D 3/7  Plan:  Catheter angio today  Plavix 75mg  plus baby asa 81mg  daily for secondary stroke prevention, use combination for 3 mos then plavix alone lifelong  Continue CIWA protocol- CCM following  Stop tobacco and alcohol abuse. Nicotine patch in place.   LOS: 4 days   Annie Main, MSN, RN, ANVP-BC, ANP-BC, GNP-BC [facility] Pager: [phone] 06/15/2012 11:53 AM  Scribe for Dr. Delia Heady, Stroke Center Medical Director, who has personally reviewed chart, pertinent data, examined the patient and developed the plan of care. Pager:  [phone]

## 2012-06-15 NOTE — Progress Notes (Addendum)
Findings of Dr Nicky Pugh arteriogram noted.  Right carotid severely diseased with multiple segments of intracranial extracranial dissection.  Appear to have probable left carotid dissection as well.  Would advise anticoagulation.  Findings discussed with pt and primary service due to patient's history of alcohol abuse we feel he is not a good candidate for coumadin.  Dr Pearlean Brownie will use Plavix and Aspirin and see him in follow up.  Fabienne Bruns, MD [facility] [place] Office: [phone] Pager: [phone]

## 2012-06-15 NOTE — Op Note (Signed)
OPERATIVE NOTE   PROCEDURE: 1.  Right common femoral artery cannulation under ultrasound guidance 2.  Arch Aortogram 3.  Right common carotid artery selection (second order) 4.  Bilateral carotid angiogram 5.  Bilateral cerebral angiogram  PRE-OPERATIVE DIAGNOSIS: Right sided hemispheric stroke  POST-OPERATIVE DIAGNOSIS: same as above   SURGEON: Brandon Sake, MD  ANESTHESIA: conscious sedation  ESTIMATED BLOOD LOSS: 30 cc  CONTRAST: 76 cc  FINDING(S):  Aorta: patent Type I aortic arch  Patent innominate, left common carotid artery, and left subclavian artery   Patent right common carotid artery and subclavian artery   Right vertebral artery not visualized  Likely right carotid dissection extending up into intracranial segment with occlusion of middle cerebral artery  Patent right external carotid artery  Patent left internal carotid artery with dissection (full extent not seen)  Patent left external carotid artery  Intracranial findings will be read by Neuroradiology  SPECIMEN(S):  none  INDICATIONS:   Brandon [name] is a 46 y.o. male who presents with right hemispheric stroke.  The patient was given thrombolysis and Dr. Jettie Booze felt a carotid angiogram was necessary to fully evaluate the right carotid system for possible angiographic string sign.  The patient presents for: bilateral carotid and cerebral angiogram.  I discussed with the patient the nature of angiographic procedures, especially the limited patencies of any endovascular intervention.  The patient is aware of that the risks of an angiographic procedure include but are not limited to: bleeding, infection, access site complications, renal failure, embolization, rupture of vessel, dissection, possible need for emergent surgical intervention, possible need for surgical procedures to treat the patient's pathology, and stroke and death.  The patient is aware of the risks and agrees to proceed.  DESCRIPTION: After  full informed consent was obtained from the patient, the patient was brought back to the angiography suite.  The patient was placed supine upon the angiography table and connected to monitoring equipment.  The patient was then given conscious sedation, the amounts of which are documented in the patient's chart.  The patient was prepped and drape in the standard fashion for an angiographic procedure.  At this point, attention was turned to the right groin.  Under ultrasound guidance, the right common femoral artery will be cannulated with a 18 gauge needle.  The [facility] wire was passed up into the aorta.  The needle was exchanged for a 5-Fr sheath, which was advanced over the wire into the common femoral artery.  The dilator was then removed.  The pigtail catheter was then loaded over the wire up to the level of the ascending aortic arch.  The catheter was connected to the power injector circuit.  After de-airring and de-clotting the circuit, a power injector arch aortogram was completed.  The findings are as listed above.  I then exchanged the catheter for a H1 catheter in the descending aortic arch.  Using this and a Benson wire, I selected the innominate artery and then selected out the right common carotid artery.  The catheter was aspirated.  The power injector was connected to the catheter.  Anterior-posterior and lateral projections of the neck and brain were completed to obtain the right carotid and cerebral angiogram.  I replaced the wire in the catheter, and pulled it back into the aortic arch.  Using these two, I selected out the left common carotid artery.  The catheter was aspirated.  The power injector was connected to the catheter.  Anterior-posterior and lateral projections of the neck  and brain were completed to obtain the left carotid and cerebral angiogram.  I replaced the wire in the catheter, and pulled it back into the aortic arch. I pulled both the wire and catheter out of the sheath.  The  sheath was aspirated.  No clots were present and the sheath was reloaded with heparinized saline.     Based on this patient's images, he has images suspicious for bilateral carotid dissection.  There is no indication for carotid endarterectomy in this patient.  After waiting 8 hours after sheath pull, the patient should be started on anticoagulation for the left carotid dissection.  He is normotensive at this point, so no further medication for the blood pressure is needed.  COMPLICATIONS: none  CONDITION: stable   Brandon Sake, MD [facility] [place] [facility] Office: [phone] Pager: [phone]  06/15/2012, 1:00 PM

## 2012-06-15 NOTE — Progress Notes (Signed)
Name: Brandon [name] MRN: [id number] DOB: Apr 06, 1966    LOS: 4  Referring Provider:  Pearlean Brownie Reason for Referral:  Alcohol abuse/ ? Dt risk  PULMONARY / CRITICAL CARE MEDICINE  HPI:  46 yo male smoker admitted 06/11/2012 with slurred speech, and left sided numbness likely from acute CVA, and was given tPA.  He reports hx of heavy alcohol use (7-10 beers per day).  PCCM consulted 9/19 to assist with management with hx of ETOH. Dat of admit 06/11/2012   Events Since Admission: 9/19 Admitted CVA s/p tPA  Current Status:  SUBJECTIVE/OVERNIGHT/INTERVAL HX Per RN - CIWA score < 6 always. Last ativan yesterday. LExiscan negative. Mostly resting.     Vital Signs: Temp:  [97.5 F (36.4 C)-97.9 F (36.6 C)] 97.9 F (36.6 C) (09/23 0600) Pulse Rate:  [84-99] 98  (09/23 0600) Resp:  [20] 20  (09/23 0600) BP: (116-159)/(80-103) 157/93 mmHg (09/23 0600) SpO2:  [99 %-100 %] 99 % (09/23 0600)  Physical Examination: General - no distress HEENT - no sinus tenderness, no oral exudate, no LAN Cardiac - s1s2 regular, no murmur Chest - no wheeze/rales Abd - soft, mild mid-epigastric tenderness, no organomegaly, +bowel sounds Ext - no edema Neuro - normal strength, speech fluent, slight tremor. RASS -1. GCS 15. Normal swallow Psych - normal mood, behavior   ASSESSMENT AND PLAN  PULMONARY  A: Hx of tobacco abuse P:   Smoking cessation Oxygen as needed to keep SpO2 > 92% Nicotine patch  CARDIOVASCULAR  Lab 06/11/12 0017  TROPONINI <0.30  LATICACIDVEN --  PROBNP --      Echo 9/19>>Hypokinesis base anterior septum, mid inferior wall, and mid inferior septum, mild LVH, EF 55% Lexiscan this admit normal per Dr Donnie Aho notes   A: Hypertension>>improved P:  Per stroke and cards  RENAL  Lab 06/15/12 0543 06/12/12 0425 06/11/12 0046 06/11/12 0017  NA 138 137 141 140  K 3.7 3.5 -- --  CL 102 97 103 100  CO2 24 26 -- 24  BUN 8 9 5* 7  CREATININE 0.58 0.62 1.00 0.68  CALCIUM  9.6 10.1 -- 9.6  MG -- 2.1 -- --  PHOS -- 4.0 -- --   Intake/Output      09/22 0701 - 09/23 0700 09/23 0701 - 09/24 0700   P.O. 360    Total Intake(mL/kg) 360 (4)    Net +360         Urine Occurrence 4 x     Foley: 9/19>>  A: Hypokalemia>>improved P:   F/u and replace electrolytes as needed  GASTROINTESTINAL  Lab 06/12/12 0425 06/11/12 0017  AST 33 49*  ALT 24 34  ALKPHOS 91 93  BILITOT 0.5 0.1*  PROT 7.2 7.4  ALBUMIN 3.6 3.9    A: Nutrition. 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 P:   Thiamine, folic acid Heart healthy diet Monitor for GI upset while taking ASA and hx of ETOH>>continue protonix  HEMATOLOGIC  Lab 06/15/12 0543 06/11/12 0046 06/11/12 0017  HGB 14.9 15.0 14.9  HCT 43.4 44.0 43.1  PLT 189 -- 220  INR -- -- 0.99  APTT -- -- 32   A: No active issues P:  F/u CBC intermittently    ENDOCRINE  CBG (last 3)   A: Mild hyperglycemia.  HbA1C 5.8. P:   Monitor blood sugars on BMET Further assessment as outpt  NEUROLOGIC  MRI brain 9/19>>Multifocal areas of acute infarction in the right hemisphere MRA head 9/19>>Recent right internal carotid artery occlusion. Collaterals  reconstitute the supraclinoid ICA.  Severe disease right A1 segment anterior cerebral artery.   A: CVA s/p tPA. Alcohol abuse.   -on 06/15/2012: no evidence of etoh wd but has slight tremors. Last ativan yesterday   P:   CIWA assessment q6hh on scheduled librium 50 mg q8h but will wean to 50mg  q12h on 06/15/2012 and dc over next few days Continue prn ativan IV for CIWA > 8 Stroke evaluation per neurology  BEST PRACTICE / DISPOSITION Level of Care:  Floor status Primary Service:  Stroke Consultants:  PCCM Code Status: Full Diet:  Heart healthy DVT Px: SCD GI Px:  protonix    Dr. Kalman Shan, M.D., [facility].C.P Pulmonary and Critical Care Medicine Staff Physician [facility] Pager: [phone], If no answer or between   15:00h - 7:00h: call [phone]  06/15/2012 11:26 AM

## 2012-06-15 NOTE — H&P (Signed)
  [facility]  History and Physical Update  The patient was interviewed and re-examined.  The patient's previous History and Physical has been reviewed and is unchanged from Dr. Evelina Dun consult on: 06/14/12.  There is no change in the plan of care: B carotid and cerebral angiogram, Arch aortogram.  Laboratory: CBC:    Component Value Date/Time   WBC 9.1 06/15/2012 0543   RBC 4.28 06/15/2012 0543   HGB 14.9 06/15/2012 0543   HCT 43.4 06/15/2012 0543   PLT 189 06/15/2012 0543   MCV 101.4* 06/15/2012 0543   MCH 34.8* 06/15/2012 0543   MCHC 34.3 06/15/2012 0543   RDW 12.9 06/15/2012 0543   LYMPHSABS 2.8 06/11/2012 0017   MONOABS 0.8 06/11/2012 0017   EOSABS 0.1 06/11/2012 0017   BASOSABS 0.1 06/11/2012 0017    BMP:    Component Value Date/Time   NA 138 06/15/2012 0543   K 3.7 06/15/2012 0543   CL 102 06/15/2012 0543   CO2 24 06/15/2012 0543   GLUCOSE 156* 06/15/2012 0543   BUN 8 06/15/2012 0543   CREATININE 0.58 06/15/2012 0543   CREATININE 0.94 02/12/2012 1419   CALCIUM 9.6 06/15/2012 0543   GFRNONAA >90 06/15/2012 0543   GFRAA >90 06/15/2012 0543    Coagulation: Lab Results  Component Value Date   INR 0.99 06/11/2012   No results found for this basename: PTT      Leonides Sake, MD [facility] [place] Office: [phone] Pager: [phone]  06/15/2012, 12:07 PM

## 2012-06-15 NOTE — Progress Notes (Signed)
Occupational Therapy Treatment Patient Details Name: Brandon [name] MRN: [id number] DOB: [birth year] Today's Date: 06/15/2012 Time: 4098-1191 OT Time Calculation (min): 27 min  OT Assessment / Plan / Recommendation Comments on Treatment Session This pt has made remarkable progress, will continue to benefit from acute OT with follow up at OP (prefers [place] for this)    Follow Up Recommendations  Outpatient OT (Pt prefers [place] (needs directions to [address]))       Equipment Recommendations  None recommended by OT;None recommended by PT       Frequency Min 2X/week   Plan Discharge plan remains appropriate    Precautions / Restrictions Precautions Precautions: None Precaution Comments: No impulsivity seen today Restrictions Weight Bearing Restrictions: No       ADL  Transfers/Ambulation Related to ADLs: Independent--pt does tend to ambulate with a bent over posture, he says this is from 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 ADL Comments: Pt moving about room and unit at an Independent level. No LOB. Had him go around unit and name all of the rooms on his right and left. He got 95% of them without consistency as to which side he missed them on. He was also having a conversation with me the whole time he was walking around the unit and looking at the room names/numbers. Had he locate items in the newspaper and he did not have any issues. Pt says he uses a table saw at work, asked him if there was someone else that can use the saw for now and he do other work, he says yes and so I recommended to him that he let someone else do it for now due to decreased saccadic eye movements with testing.      OT Goals ADL Goals ADL Goal: Upper Body Bathing - Progress: Met ADL Goal: Lower Body Bathing - Progress: Met ADL Goal: Lower Body Dressing - Progress: Met ADL Goal: Toilet Transfer - Progress: Met ADL Goal: Toileting  - Clothing Manipulation - Progress: Met ADL Goal: Tub/Shower Transfer - Progress: Met ADL Goal: Additional Goal #1 - Progress: Met (No issues today) Miscellaneous OT Goals Miscellaneous OT Goal #1: Pt will be independent in finding his way around unit to points asked of him. OT Goal: Miscellaneous Goal #1 - Progress: Goal set today Miscellaneous OT Goal #2: Pt will scan paperwork in an organized fashion to perform tasks, locate items. OT Goal: Miscellaneous Goal #2 - Progress: Goal set today  Visit Information  Last OT Received On: 06/15/12 Assistance Needed: +1    Subjective Data  Subjective: I use a table saw at work and I really pay attention.  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 I always had to pay attention to  the ball coming at me      Cognition  Overall Cognitive Status: Appears within functional limits for tasks assessed/performed Arousal/Alertness: Awake/alert Orientation Level: Oriented X4 / Intact Behavior During Session: [facility] for tasks performed Cognition - Other Comments: Marked improvments from 06/13/2012             End of Session OT - End of Session Equipment Utilized During Treatment:  (None needed, pt not unsteady) Activity Tolerance: Patient tolerated treatment well Patient left: in bed (sitting EOB) Nurse Communication:  (pt safe to walk around unit by himself)       Evette Georges [phone] 06/15/2012, 9:26 AM

## 2012-06-16 ENCOUNTER — Inpatient Hospital Stay (HOSPITAL_COMMUNITY): Payer: Medicaid Other

## 2012-06-16 DIAGNOSIS — IMO0001 Reserved for inherently not codable concepts without codable children: Secondary | ICD-10-CM | POA: Diagnosis present

## 2012-06-16 DIAGNOSIS — F17200 Nicotine dependence, unspecified, uncomplicated: Secondary | ICD-10-CM

## 2012-06-16 MED ORDER — SODIUM CHLORIDE 0.9 % IV SOLN
INTRAVENOUS | Status: DC
  Administered 2012-06-16: 13:00:00 via INTRAVENOUS

## 2012-06-16 MED ORDER — VARENICLINE TARTRATE 1 MG PO TABS: 1.0000 mg | ORAL_TABLET | Freq: Two times a day (BID) | ORAL | Status: DC

## 2012-06-16 MED ORDER — ZOLPIDEM TARTRATE 10 MG PO TABS: 10.0000 mg | ORAL_TABLET | Freq: Every evening | ORAL | Status: DC | PRN

## 2012-06-16 MED ORDER — DOCUSATE SODIUM 100 MG PO CAPS
100.0000 mg | ORAL_CAPSULE | Freq: Every day | ORAL | Status: DC
  Administered 2012-06-16 – 2012-06-17 (×2): 100 mg via ORAL
  Filled 2012-06-16 (×2): qty 1

## 2012-06-16 MED ORDER — VARENICLINE TARTRATE 0.5 MG PO TABS: 0.5000 mg | ORAL_TABLET | Freq: Every day | ORAL | Status: DC

## 2012-06-16 MED ORDER — CHLORDIAZEPOXIDE HCL 25 MG PO CAPS
25.0000 mg | ORAL_CAPSULE | Freq: Two times a day (BID) | ORAL | Status: AC
  Administered 2012-06-16 – 2012-06-18 (×4): 25 mg via ORAL
  Filled 2012-06-16 (×4): qty 1

## 2012-06-16 MED ORDER — METOCLOPRAMIDE HCL 5 MG/ML IJ SOLN
10.0000 mg | Freq: Three times a day (TID) | INTRAMUSCULAR | Status: DC
  Administered 2012-06-16 – 2012-06-18 (×6): 10 mg via INTRAVENOUS
  Filled 2012-06-16 (×10): qty 2

## 2012-06-16 MED ORDER — VARENICLINE TARTRATE 0.5 MG PO TABS: 0.5000 mg | ORAL_TABLET | Freq: Two times a day (BID) | ORAL | Status: DC

## 2012-06-16 MED ORDER — VARENICLINE TARTRATE 0.5 MG PO TABS
0.5000 mg | ORAL_TABLET | Freq: Every day | ORAL | Status: DC
  Filled 2012-06-16: qty 1

## 2012-06-16 MED ORDER — BISACODYL 10 MG RE SUPP
10.0000 mg | Freq: Once | RECTAL | Status: AC
  Administered 2012-06-16: 10 mg via RECTAL
  Filled 2012-06-16: qty 1

## 2012-06-16 NOTE — Progress Notes (Signed)
History: Brandon [name] is an 46 y.o. male with a history of smoking who was in his normal state at 10:45 06/10/2012 as he watching TV with his wife. At 28, his wife noticed facial drooping and slurred speech and called 911. Since that time, he has continued to have slurred speech, and has noticed that his left arm and leg are numb. He states that his arm has been going "in and out" on him. He does not take any daily antiplatelet medicine. He states that he checked his blood pressure last week and it was 120 systolic. He does have a history of alcohol abuse, consuming 7-10 beers per night, and tonight he feels that he has been on the low side, around 7. Patient was a TPA candidate. He was admitted to the neuro ICU for further evaluation and treatment.  LSN:10:45 06/10/2012 tPA Given: yes  Subjective: Wife reports patient has been vomiting all night - ~ 12 times. Patient complains of abdominal tenderness and hard stools without a "good BM" since admission. Pt works with table saws. Anxious to go back to work.  Objective: Filed Vitals:   06/16/12 0200 06/16/12 0600 06/16/12 1000 06/16/12 1400  BP: 137/98 166/98 166/112 168/112  Pulse: 99 99 94 100  Temp: 97.3 F (36.3 C) 98.2 F (36.8 C) 98.5 F (36.9 C) 97.7 F (36.5 C)  TempSrc: Oral Oral Oral Oral  Resp: 18 18 18 18   Height:      Weight:      SpO2: 98% 100% 99% 100%    Diet: Cardiac Activity: up with assist DVT Prophylaxis: SCDs ordered  Medications: Scheduled:    . aspirin EC  81 mg Oral Daily  . atorvastatin  80 mg Oral q1800  . cephALEXin  500 mg Oral Q12H  . chlordiazePOXIDE  50 mg Oral Q12H  . clopidogrel  75 mg Oral QAC breakfast  . folic acid  1 mg Oral Daily  . multivitamin with minerals  1 tablet Oral Daily  . nicotine  21 mg Transdermal Daily  . pantoprazole  40 mg Oral Q1200  . thiamine  100 mg Oral Daily   Neurologic Exam: Mental Status: Alert, oriented, thought content appropriate.  Speech fluent without  evidence of aphasia. Able to follow 3 step commands without difficulty. Cranial Nerves: II- Visual fields grossly intact. III/IV/VI-Extraocular movements intact.  Pupils reactive bilaterally. V/VII-minimal left upper and lower facial droop. VIII-hearing grossly intact IX/X-normal gag XI-bilateral shoulder shrug XII-midline tongue extension Motor: 5/5 bilaterally with normal tone and bulk.diminished fine finger movements on left and orbits right over left upper extremities. Sensory: Light touch intact throughout, bilaterally Deep Tendon Reflexes: 2+ and symmetric throughout Plantars: Downgoing bilaterally Cerebellar: Normal finger-to-nose, normal rapid alternating movements and normal heel-to-shin test.   Physical exam: Lungs with decreased air movement throughout; no rhonchi or wheeze. Cor RRR without M/G/R Extremities: has saline locked IV's anterior forearms.  Both have local swelling and induration with mild tenderness and increased warmth.  The left has a palpable cord vs induration and red streak towards the antecubital fossa.  Lab Results: Basic Metabolic Panel:  Lab 06/15/12 1610 06/12/12 0425  NA 138 137  K 3.7 3.5  CL 102 97  CO2 24 26  GLUCOSE 156* 150*  BUN 8 9  CREATININE 0.58 0.62  CALCIUM 9.6 10.1  MG -- 2.1  PHOS -- 4.0   Liver Function Tests:  Lab 06/12/12 0425 06/11/12 0017  AST 33 49*  ALT 24 34  ALKPHOS 91  93  BILITOT 0.5 0.1*  PROT 7.2 7.4  ALBUMIN 3.6 3.9   CBC:  Lab 06/15/12 0543 06/11/12 0046 06/11/12 0017  WBC 9.1 -- 8.3  NEUTROABS -- -- 4.5  HGB 14.9 15.0 --  HCT 43.4 44.0 --  MCV 101.4* -- 101.7*  PLT 189 -- 220   Hemoglobin A1C:  Lab 06/11/12 0425  HGBA1C 5.8*   Fasting Lipid Panel:  Lab 06/11/12 0425  CHOL 207*  HDL 84  LDLCALC 110*  TRIG 66  CHOLHDL 2.5  LDLDIRECT --   Coagulation:  Lab 06/11/12 0017  LABPROT 13.0  INR 0.99   Urine Drug Screen: Drugs of Abuse     Component Value Date/Time   LABOPIA NONE  DETECTED 06/11/2012 1126   COCAINSCRNUR NONE DETECTED 06/11/2012 1126   LABBENZ NONE DETECTED 06/11/2012 1126   AMPHETMU NONE DETECTED 06/11/2012 1126   THCU NONE DETECTED 06/11/2012 1126   LABBARB NONE DETECTED 06/11/2012 1126    Alcohol Level:  Lab 06/11/12 0017  ETH 199*    Study Results:  CT of the brain 06/11/2012 No acute finding. Age advanced atrophy.   MRI HEAD  06/11/2012  Multifocal areas of acute infarction in the right hemisphere.  This could represent a shower of emboli or possibly  hypoperfusion.    MRA HEAD  06/11/2012  Findings consistent with recent right internal carotid artery occlusion.  Collaterals reconstitute the supraclinoid ICA.  Severe disease right A1 segment anterior cerebral artery.    CT angio of the neck 9/21/213 Almost complete occlusion of the right internal carotid artery with thread like appearance 1.8 cm above its origin with a string like appearance beyond this region into the level of the level of the cavernous sinus. 58% diameter stenosis proximal left internal carotid artery. Occluded right vertebral artery with reconstitution of flow at the C3 level.  Cerebral Angiogram  06/15/2012 Likely right carotid dissection extending up into intracranial segment with occlusion of middle cerebral artery; Patent left internal carotid artery with dissection (full extent not seen)  2D Echocardiogram  06/11/12  Left ventricle: There are some unusual wall motion abnormalities. There is hypokinesis at the base of the anterior septum. There is hypokinesis of the mid inferiorwall and mid inferior septum. The cavity size was mildly dilated. Wall thickness was increased in a pattern of mild LVH. The estimated ejection fraction was 55%. - Impressions: No cardiac source of embolism was identified, but cannot be ruled out on the basis of this examination.  Carotid Doppler 06/11/12  Right - Heavily calcified ICA with diminished flow proximally. There is a pre occlusion "thumping" in the  mid to distal region. Vertebral artery flow is antegrade.  Left - Moderate calcification of the ICA with no obvious evidence of significant stenosis. Acoustic shadowing noted in the proximal region which may obscure higher velocities. Vertebral artery flow is antegrade  CXR 06/11/2012 Mild basilar atelectasis. Stable mild cardiomegaly.   Nuclear Myocar Multi w/ spect w/wall motion 06/14/2012  1. Minimal attenuation of the inferior wall extending towards the apex. No definite scintigraphic evidence of prior infarction or pharmacologically induced ischemia. 2. Minimal hypokinesia involving the basilar portion of the inferior wall and septum. Slightly decreased ejection fraction - 51%.  Therapies: OP PT and OT recommended  Assessment/Plan: [name] is a 46 y.o. 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 Also with left ICA  dissection.  Status post IV t-PA 06/11/2012 started at 0037.  On no antiplatlets prior to admission. Now on ECASA 81mg  plus Plavix 75 mg daily for secondary stroke prevention.     Silent CAD with no ischemia on cardiolite.  etoh abuse, on CIWA protocol, UDS negative  Hx pancreatitis May 2011, chronic pancreatitis d/t etoh abuse  GERD  Cigarette smoker, 1 PPD. Has nicotine patch Hypertension  Hyperlipidemia, LDL 110, now on lipitor 80 mg daily Carotid disease, R ICA  Cellulitis at catheter insertion site. On Keflex 500 mg BID x 7 days for cellulitis. D 3/7 Abdominal pain, constipation and nausea and vomiting. abd tender. ? etiology  Plan:  Decrease lipitor dose  Plavix 75mg  plus baby asa 81mg  daily for secondary stroke prevention given dissection. Not a coumadin candidate due to etoh abuse/non compliance.   reglan q 6h, colace, protonix  Resume IVF. Avoid dehydration given dissection  OP PT and OT.    ducolax suppository  Abdominal xray if sx continue                                 Stop tobacco and alcohol abuse.   followup Dr. Donnie Aho in 1 mo    LOS: 5 days   Annie Main, MSN, RN, ANVP-BC, ANP-BC, [facility] Pager: [phone] 06/16/2012 11:28 AM  Scribe for Dr. Delia Heady, Stroke Center Medical Director, who has personally reviewed chart, pertinent data, examined the patient and developed the plan of care. Pager:  [phone]

## 2012-06-16 NOTE — Progress Notes (Signed)
Patient's wife told RN that he had a small BM this evening. Sample was flushed. Will cont to monitor  Minor, Yvette Rack

## 2012-06-16 NOTE — Progress Notes (Addendum)
Name: Brandon [name] MRN: [id number] DOB: 06/15/1966    LOS: 5  Referring Provider:  Pearlean Brownie Reason for Referral:  Alcohol abuse/ ? Dt risk  PULMONARY / CRITICAL CARE MEDICINE  HPI:  46 yo male smoker admitted 06/11/2012 with slurred speech, and left sided numbness likely from acute CVA, and was given tPA.  He reports hx of heavy alcohol use (7-10 beers per day).  PCCM consulted 9/19 to assist with management with hx of ETOH. Dat of admit 06/11/2012   Events Since Admission: 9/19 Admitted CVA s/p tPA  Current Status:  SUBJECTIVE/OVERNIGHT/INTERVAL HX No evidence of w/d. C/O abd pain. Vital Signs: Temp:  [97.3 F (36.3 C)-98.2 F (36.8 C)] 98.2 F (36.8 C) (09/24 0600) Pulse Rate:  [80-99] 99  (09/24 0600) Resp:  [18] 18  (09/24 0600) BP: (108-166)/(76-106) 166/98 mmHg (09/24 0600) SpO2:  [98 %-100 %] 100 % (09/24 0600) Room air Physical Examination: General - no distress HEENT - no sinus tenderness, no oral exudate, no LAN Cardiac - s1s2 regular, no murmur Chest - no wheeze/rales Abd - soft, mild mid-epigastric tenderness, no organomegaly, +bowel sounds Ext - no edema Neuro - normal strength, speech fluent, slight tremor. RASS -1. GCS 15. Normal swallow Psych - normal mood, behavior   ASSESSMENT AND PLAN  PULMONARY  A: Hx of tobacco abuse P:   Smoking cessation Oxygen as needed to keep SpO2 > 92% Nicotine patch  CARDIOVASCULAR  Lab 06/11/12 0017  TROPONINI <0.30  LATICACIDVEN --  PROBNP --      Echo 9/19>>Hypokinesis base anterior septum, mid inferior wall, and mid inferior septum, mild LVH, EF 55% Lexiscan this admit normal per Dr Donnie Aho notes   A: Hypertension>>improved/controlled P:  Per stroke and cards  RENAL  Lab 06/15/12 0543 06/12/12 0425 06/11/12 0046 06/11/12 0017  NA 138 137 141 140  K 3.7 3.5 -- --  CL 102 97 103 100  CO2 24 26 -- 24  BUN 8 9 5* 7  CREATININE 0.58 0.62 1.00 0.68  CALCIUM 9.6 10.1 -- 9.6  MG -- 2.1 -- --  PHOS --  4.0 -- --   Intake/Output      09/23 0701 - 09/24 0700 09/24 0701 - 09/25 0700   P.O. 360    Total Intake(mL/kg) 360 (4)    Net +360         Urine Occurrence 5 x     Foley: 9/19>>  A: Hypokalemia>>improved/resolved P:   F/u and replace electrolytes as needed  GASTROINTESTINAL  Lab 06/12/12 0425 06/11/12 0017  AST 33 49*  ALT 24 34  ALKPHOS 91 93  BILITOT 0.5 0.1*  PROT 7.2 7.4  ALBUMIN 3.6 3.9    A: Nutrition. 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 Abd pain P:   Thiamine, folic acid Heart healthy diet Monitor for GI upset while taking ASA and hx of ETOH>>continue protonix Send lipase  HEMATOLOGIC  Lab 06/15/12 0543 06/11/12 0046 06/11/12 0017  HGB 14.9 15.0 14.9  HCT 43.4 44.0 43.1  PLT 189 -- 220  INR -- -- 0.99  APTT -- -- 32   A: No active issues P:  F/u CBC intermittently    ENDOCRINE  CBG (last 3)   A: Mild hyperglycemia.  HbA1C 5.8. P:   Monitor blood sugars on BMET Further assessment as outpt  NEUROLOGIC  MRI brain 9/19>>Multifocal areas of acute infarction in the right hemisphere MRA head 9/19>>Recent right internal carotid artery occlusion. Collaterals reconstitute the supraclinoid ICA.  Severe disease  right A1 segment anterior cerebral artery.   A: CVA s/p tPA. Alcohol abuse.   -on 06/16/2012: no evidence of etoh wd    P:   WEan off librium over next 48h CIWA assessment q6hh Continue prn ativan IV for CIWA > 8 Stroke evaluation per neurology  #Smoking (staff note)  -A: Has quit since admit date 06/11/2012. Not too motivated to quit but wife interested in chantix (? Will help as etoh deterrent as well) but concerned about costs. He spends $120/mo on tobacco.  No firearms. No recent violent or suicidal behaviro. No nightmares  P CounseledCost of chantix roughly same but for 3 months and after that savings of $120/m Recommend start chantix after vomitting currently ongoing settles (side effects counseliend, if nightmares wife adivsed  to call PMD) FU PMD  BEST PRACTICE / DISPOSITION Level of Care:  Floor status Primary Service:  Stroke Consultants:  PCCM Code Status: Full Diet:  Heart healthy DVT Px: SCD GI Px:  protonix   We will sign off. No further evidence of w/d   06/16/2012 9:32 AM   STAFF NOTE: I, Dr Lavinia Sharps have personally reviewed patient's available data, including medical history, events of note, physical examination and test results as part of my evaluation. I have discussed with resident/NP and other care providers such as pharmacist, RN and RRT.  In addition,  I personally evaluated patient and elicited key findings of staff note.  Rest per NP/medical resident whose note is outlined above and that I agree with    Dr. Kalman Shan, M.D., [facility].C.P Pulmonary and Critical Care Medicine Staff Physician [facility] Pager: [phone], If no answer or between  15:00h - 7:00h: call [phone]  06/16/2012 3:28 PM

## 2012-06-16 NOTE — Progress Notes (Signed)
Subjective:  No C/o SOB or chest pain. Vomiting today.  The results of the carotid angio were noted.  Objective:  Vital Signs in the last 24 hours: BP 166/112  Pulse 94  Temp 98.5 F (36.9 C) (Oral)  Resp 18  Ht 6\' 1"  (1.854 m)  Wt 89.6 kg (197 lb 8.5 oz)  BMI 26.06 kg/m2  SpO2 99%  Physical Exam: WM in NAD Lungs:  Clear  Cardiac:  Regular rhythm, normal S1 and S2, no S3   Intake/Output from previous day: 09/23 0701 - 09/24 0700 In: 360 [P.O.:360] Out: -  Weight Filed Weights   06/11/12 0024 06/11/12 0145  Weight: 91 kg (200 lb 9.9 oz) 89.6 kg (197 lb 8.5 oz)    Lab Results: Basic Metabolic Panel:  Basename 06/15/12 0543  NA 138  K 3.7  CL 102  CO2 24  GLUCOSE 156*  BUN 8  CREATININE 0.58    CBC:  Basename 06/15/12 0543  WBC 9.1  NEUTROABS --  HGB 14.9  HCT 43.4  MCV 101.4*  PLT 189   Telemetry: Sinus rhythm  Cardiolite:  No ischemia EF 51%  Assessment/Plan:  1. Recent right MCA occlusion and CVA 2. 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 Carotid artery disease.  Rec:  CVA was secondary to carotid artery disease and not embolic from heart.  No evidence of ischemia now and should be treated medically.   He will need intense risk factor modification including treatment of cholesterol with LDL goal of <70, add ace inhibitor for cardioprotection and d/c smoking and alcohol.  He will need good BP control when stable from CVA viewpoint.  I would need to see in 1 month for followup.  Darden Palmer  MD [facility]  06/16/2012, 2:00 PM

## 2012-06-16 NOTE — Progress Notes (Signed)
Physical Therapy Treatment Patient Details Name: Brandon [name] MRN: [id number] DOB: 21-Oct-1965 Today's Date: 06/16/2012 Time: 0865-7846 PT Time Calculation (min): 22 min  PT Assessment / Plan / Recommendation Comments on Treatment Session  Mr. Podolak still demonstrating impulsivity however per girlfriend this is his personality. At this point the patient has met the majority of his goals and the rest can be addressed in outpatient therapy (i.e. balance). Educated pt and girlfriend on safety risks and slowing down with more challenging activities (stairs, head turns during ambulation). Girlfriend will be at home with him for supervision.  Continue to recommend OPPT for f/u. No further acute PT needs at this time. Educated pt and girlfriend on sxs of CVA and the importance of immediate medical attention. Pt able to verbalize these back to me.     Follow Up Recommendations  Supervision for mobility/OOB;Outpatient PT    Barriers to Discharge        Equipment Recommendations  None recommended by PT    Recommendations for Other Services    Frequency     Plan All goals met and education completed, patient dischaged from PT services    Precautions / Restrictions Precautions Precautions: Fall       Mobility  Transfers Sit to Stand: 7: Independent;With upper extremity assist;From bed Stand to Sit: 7: Independent;With upper extremity assist;To bed Ambulation/Gait Ambulation/Gait Assistance: 5: Supervision Ambulation Distance (Feet): 400 Feet Assistive device: None Ambulation/Gait Assistance Details: improved ambulation with his sneakers on, cues for upright posture and decreased impulsivity with higher challenging activities Gait Pattern: Trunk flexed;Step-through pattern Gait velocity: 3.95 ft/sec (community ambulation speed) Stairs Assistance: 5: Supervision Stairs Assistance Details (indicate cue type and reason): pt again very impulsive with steps, skipping every other step to  ascend, so quickly that I could not keep up with him; to descend pt performed each step but still with very quick speed Stair Management Technique: No rails Number of Stairs: 12       PT Goals Acute Rehab PT Goals PT Goal: Sit to Stand - Progress: Met PT Goal: Stand to Sit - Progress: Met PT Goal: Ambulate - Progress: Progressing toward goal PT Goal: Up/Down Stairs - Progress: Met Additional Goals PT Goal: Additional Goal #1 - Progress: Progressing toward goal  Visit Information  Last PT Received On: 06/16/12 Assistance Needed: +1    Subjective Data  Subjective: I didn't get to sleep until 6 am this morning Patient Stated Goal: Im going home today   Cognition  Overall Cognitive Status: Impaired Area of Impairment: Safety/judgement;Attention Arousal/Alertness: Awake/alert Orientation Level: Appears intact for tasks assessed Behavior During Session: [facility] for tasks performed Current Attention Level: Alternating Safety/Judgement: Impulsive    Balance  Dynamic Gait Index Level Surface: Normal Change in Gait Speed: Normal Gait with Horizontal Head Turns: Mild Impairment Gait with Vertical Head Turns: Mild Impairment Gait and Pivot Turn: Mild Impairment Step Over Obstacle: Mild Impairment Step Around Obstacles: Mild Impairment Steps: Normal Total Score: 19  High Level Balance High Level Balance Activites: Backward walking;Direction changes;Sudden stops  End of Session PT - End of Session Equipment Utilized During Treatment: Gait belt Activity Tolerance: Patient tolerated treatment well Patient left: in bed;with call bell/phone within reach Nurse Communication: Mobility status   GP     [facility] HELEN 06/16/2012, 9:45 AM

## 2012-06-16 NOTE — Progress Notes (Signed)
Occupational Therapy Treatment Patient Details Name: Brandon [name] MRN: [id number] DOB: 18-Nov-1965 Today's Date: 06/16/2012 Time: 1324-4010 OT Time Calculation (min): 28 min  OT Assessment / Plan / Recommendation Comments on Treatment Session This patient continues to make progress however attention and visual skills are still affected. Re-iterated the need to follow up with OPOT once he leaves the hospital.    Follow Up Recommendations  Outpatient OT (Pt prefers [place] (needs directions to [address]))       Equipment Recommendations  None recommended by PT       Frequency Min 2X/week   Plan Discharge plan remains appropriate    Precautions / Restrictions Precautions Precautions: None Restrictions Weight Bearing Restrictions: No         OT Goals Miscellaneous OT Goals OT Goal: Miscellaneous Goal #2 - Progress: Progressing toward goals  Visit Information  Last OT Received On: 06/16/12 Assistance Needed: +1    Subjective Data  Subjective: I threw up all night      Cognition  Overall Cognitive Status: Impaired Area of Impairment: Attention;Safety/judgement Arousal/Alertness: Awake/alert Orientation Level: Appears intact for tasks assessed Behavior During Session: Brandon [name] for tasks performed Current Attention Level: Alternating Safety/Judgement: Impulsive Safety/Judgement - Other Comments: Decreased awareness of decreased attention (question safety at his job--girlfriend aware and will talk to his boss about this in relation to power tools)    Mobility   Transfers Transfers: Sit to Stand;Stand to Sit Sit to Stand: 7: Independent Stand to Sit: 7: Independent     Exercises  Other Exercises Other Exercises: Had pt do letter cancellation tasks of letters in a line with mean score of 8.25 for 8 (j-s) rows of letters (throwing out the lowest 6.3 (m) and highest 25.3 (q)). Also cancellation of consecutive numbers in a set of numbers: the first time he was very  disorganized crossing out numbers wherever he saw them; not going left to right with score of 1:15.8 seconds. The second time cueing him to go left to right he had a time of 1:00.6 seconds. The thrid visual task was going left to right criss-crossing back and forth across the page as I called out letters (on one occassion he went vertical v. horzontal and then as the page got more busy with all the lines it took him longer to find the letter on the opposite side to draw the line to). Anohter activity to look at his attention, vision and GM/FM was stacking of cups in a sequential order with letters written on them--he did well with  this.      End of Session OT - End of Session Equipment Utilized During Treatment:  (None) Activity Tolerance: Patient limited by fatigue (did not sleep last night due to N/V) Patient left: in bed;with family/visitor present (girlfriend)       Evette Georges [phone] 06/16/2012, 10:35 AM

## 2012-06-16 NOTE — Progress Notes (Signed)
Speech Language Pathology Treatment Patient Details Name: Brandon [name] MRN: [id number] DOB: 07-May-1966 Today's Date: 06/16/2012 Time: 1351-1400 SLP Time Calculation (min): 9 min  Assessment / Plan / Recommendation Clinical Impression  Session was brief due to current stomach pain.  RN aware and administered pain meds.  SLP reviewed strategies for working and prospective memory.  He reports prior to admission he wrote a "to do list" in his journal each night in preparation for the next day.  SLP provided further education with rehab.  Per pt. and wife he is reluctant and states he does not want to go.  SLP further explained the benefits of continuing therapy now and the benefits of evaluation in his home environment or with higher level tasks.  Cardiologist arrived and session ended.  Pt. may be discharging tomorrow. Recommend  outpatient ST.    SLP Plan  Continue with current plan of care       SLP Goals  SLP Goals SLP Goal #1 - Progress: Progressing toward goal SLP Goal #2 - Progress: Progressing toward goal  General Temperature Spikes Noted: No Respiratory Status: Room air Behavior/Cognition: Alert;Cooperative;Pleasant mood Patient Positioning: Upright in bed  Oral Cavity - Oral Hygiene Does patient have any of the following "at risk" factors?: None of the above Brush patient's teeth BID with toothbrush (using toothpaste with fluoride): Yes   Treatment Treatment focused on: Cognition Skilled Treatment: Faciliation of cognitive abilities with concentration on the areas of working memory and education regarding post acute rehah services.  See impression statement for details        Darrow Bussing.Ed [employer] [phone]  06/16/2012

## 2012-06-17 LAB — MAGNESIUM: Magnesium: 2.3 mg/dL (ref 1.5–2.5)

## 2012-06-17 LAB — COMPREHENSIVE METABOLIC PANEL
ALT: 17 U/L (ref 0–53)
AST: 22 U/L (ref 0–37)
Albumin: 3.4 g/dL — ABNORMAL LOW (ref 3.5–5.2)
CO2: 29 mEq/L (ref 19–32)
Calcium: 9.7 mg/dL (ref 8.4–10.5)
Creatinine, Ser: 0.64 mg/dL (ref 0.50–1.35)
Sodium: 136 mEq/L (ref 135–145)
Total Protein: 6.9 g/dL (ref 6.0–8.3)

## 2012-06-17 MED ORDER — MAGNESIUM CITRATE PO SOLN
1.0000 | Freq: Once | ORAL | Status: AC
  Administered 2012-06-17: 1 via ORAL
  Filled 2012-06-17: qty 296

## 2012-06-17 NOTE — Progress Notes (Signed)
Patient had received suppository this afternoon with little relief. Patient and his wife requested a fleets' enema. MD notified. Order received for Tap Water enema. Enema given. Very minimal amount of hard stool passed. BS hypoactive. When asked if passing gas, patient states "not really." Patient still c/o severe abdominal pain and has vomited anything he ingests. MD notified again. No new orders. Wants to see wait to see if newly ordered reglan begins to work. Will cont to monitor.

## 2012-06-17 NOTE — Progress Notes (Signed)
History: Brandon [name] is an 46 y.o. male with a history of smoking who was in his normal state at 10:45 06/10/2012 as he watching TV with his wife. At 69, his wife noticed facial drooping and slurred speech and called 911. Since that time, he has continued to have slurred speech, and has noticed that his left arm and leg are numb. He states that his arm has been going "in and out" on him. He does not take any daily antiplatelet medicine. He states that he checked his blood pressure last week and it was 120 systolic. He does have a history of alcohol abuse, consuming 7-10 beers per night, and tonight he feels that he has been on the low side, around 7. Patient was a TPA candidate. He was admitted to the neuro ICU for further evaluation and treatment.  LSN:10:45 06/10/2012 tPA Given: yes  Subjective: Wife reports 1 episode of vomiting last night. Patient reports abdomen less tender. Minimal results with ducolax suppository yesterday. Also received 2  Tap water enemas with no results. RN has ordered mag citrate for pt today.   Objective: Filed Vitals:   06/17/12 0357 06/17/12 0600 06/17/12 0659 06/17/12 0911  BP: 157/104 201/107 145/105 160/105  Pulse:  83  88  Temp:  97.7 F (36.5 C)  97.9 F (36.6 C)  TempSrc:  Oral  Oral  Resp:  19  18  Height:      Weight:      SpO2:  98%  100%    Diet: Cardiac Activity: up with assist DVT Prophylaxis: SCDs ordered  Medications: Scheduled:    . aspirin EC  81 mg Oral Daily  . atorvastatin  80 mg Oral q1800  . bisacodyl  10 mg Rectal Once  . cephALEXin  500 mg Oral Q12H  . chlordiazePOXIDE  25 mg Oral Q12H  . clopidogrel  75 mg Oral QAC breakfast  . docusate sodium  100 mg Oral Daily  . folic acid  1 mg Oral Daily  . magnesium citrate  1 Bottle Oral Once  . metoCLOPramide (REGLAN) injection  10 mg Intravenous Q8H  . multivitamin with minerals  1 tablet Oral Daily  . nicotine  21 mg Transdermal Daily  . pantoprazole  40 mg Oral Q1200  .  thiamine  100 mg Oral Daily  . DISCONTD: chlordiazePOXIDE  50 mg Oral Q12H  . DISCONTD: varenicline  0.5 mg Oral Daily  . DISCONTD: varenicline  0.5 mg Oral Daily  . DISCONTD: varenicline  0.5 mg Oral BID  . DISCONTD: varenicline  0.5 mg Oral Daily  . DISCONTD: varenicline  0.5 mg Oral BID  . DISCONTD: varenicline  1 mg Oral BID  . DISCONTD: varenicline  1 mg Oral BID   Neurologic Exam: Mental Status: Alert, oriented, thought content appropriate.  Speech fluent without evidence of aphasia. Able to follow 3 step commands without difficulty. Cranial Nerves: II- Visual fields grossly intact. III/IV/VI-Extraocular movements intact.  Pupils reactive bilaterally. V/VII-minimal left upper and lower facial droop. VIII-hearing grossly intact IX/X-normal gag XI-bilateral shoulder shrug XII-midline tongue extension Motor: 5/5 bilaterally with normal tone and bulk.diminished fine finger movements on left and orbits right over left upper extremities. Sensory: Light touch intact throughout, bilaterally Deep Tendon Reflexes: 2+ and symmetric throughout Plantars: Downgoing bilaterally Cerebellar: Normal finger-to-nose, normal rapid alternating movements and normal heel-to-shin test.   Physical exam: Lungs with decreased air movement throughout; no rhonchi or wheeze. Cor RRR without M/G/R Extremities: has saline locked IV's anterior forearms.  Both have local swelling and induration with mild tenderness and increased warmth.  The left has a palpable cord vs induration and red streak towards the antecubital fossa.  Lab Results: Basic Metabolic Panel:  Lab 06/15/12 0865 06/12/12 0425  NA 138 137  K 3.7 3.5  CL 102 97  CO2 24 26  GLUCOSE 156* 150*  BUN 8 9  CREATININE 0.58 0.62  CALCIUM 9.6 10.1  MG -- 2.1  PHOS -- 4.0   Liver Function Tests:  Lab 06/12/12 0425 06/11/12 0017  AST 33 49*  ALT 24 34  ALKPHOS 91 93  BILITOT 0.5 0.1*  PROT 7.2 7.4  ALBUMIN 3.6 3.9   CBC:  Lab  06/15/12 0543 06/11/12 0046 06/11/12 0017  WBC 9.1 -- 8.3  NEUTROABS -- -- 4.5  HGB 14.9 15.0 --  HCT 43.4 44.0 --  MCV 101.4* -- 101.7*  PLT 189 -- 220   Hemoglobin A1C:  Lab 06/11/12 0425  HGBA1C 5.8*   Fasting Lipid Panel:  Lab 06/11/12 0425  CHOL 207*  HDL 84  LDLCALC 110*  TRIG 66  CHOLHDL 2.5  LDLDIRECT --   Coagulation:  Lab 06/11/12 0017  LABPROT 13.0  INR 0.99   Urine Drug Screen: Drugs of Abuse     Component Value Date/Time   LABOPIA NONE DETECTED 06/11/2012 1126   COCAINSCRNUR NONE DETECTED 06/11/2012 1126   LABBENZ NONE DETECTED 06/11/2012 1126   AMPHETMU NONE DETECTED 06/11/2012 1126   THCU NONE DETECTED 06/11/2012 1126   LABBARB NONE DETECTED 06/11/2012 1126    Alcohol Level:  Lab 06/11/12 0017  ETH 199*    Study Results:  CT of the brain 06/11/2012 No acute finding. Age advanced atrophy.   MRI HEAD  06/11/2012  Multifocal areas of acute infarction in the right hemisphere.  This could represent a shower of emboli or possibly  hypoperfusion.    MRA HEAD  06/11/2012  Findings consistent with recent right internal carotid artery occlusion.  Collaterals reconstitute the supraclinoid ICA.  Severe disease right A1 segment anterior cerebral artery.    CT angio of the neck 9/21/213 Almost complete occlusion of the right internal carotid artery with thread like appearance 1.8 cm above its origin with a string like appearance beyond this region into the level of the level of the cavernous sinus. 58% diameter stenosis proximal left internal carotid artery. Occluded right vertebral artery with reconstitution of flow at the C3 level.  Cerebral Angiogram  06/15/2012 Likely right carotid dissection extending up into intracranial segment with occlusion of middle cerebral artery; Patent left internal carotid artery with dissection (full extent not seen)  2D Echocardiogram  06/11/12  Left ventricle: There are some unusual wall motion abnormalities. There is hypokinesis at  the base of the anterior septum. There is hypokinesis of the mid inferiorwall and mid inferior septum. The cavity size was mildly dilated. Wall thickness was increased in a pattern of mild LVH. The estimated ejection fraction was 55%. - Impressions: No cardiac source of embolism was identified, but cannot be ruled out on the basis of this examination.  Carotid Doppler 06/11/12  Right - Heavily calcified ICA with diminished flow proximally. There is a pre occlusion "thumping" in the mid to distal region. Vertebral artery flow is antegrade.  Left - Moderate calcification of the ICA with no obvious evidence of significant stenosis. Acoustic shadowing noted in the proximal region which may obscure higher velocities. Vertebral artery flow is antegrade  CXR 06/11/2012 Mild basilar atelectasis. Stable mild  cardiomegaly.   Nuclear Myocar Multi w/ spect w/wall motion 06/14/2012  1. Minimal attenuation of the inferior wall extending towards the apex. No definite scintigraphic evidence of prior infarction or pharmacologically induced ischemia. 2. Minimal hypokinesia involving the basilar portion of the inferior wall and septum. Slightly decreased ejection fraction - 51%.  DG Abdomen 06/16/2012 mild ileus and mild constipation  Therapies: OP PT and OT recommended  Assessment/Plan: Mr. DUONG HAYDEL is a 46 y.o. 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 Also with left ICA dissection.  Status post IV t-PA 06/11/2012 started at 0037.  On no antiplatlets prior to admission. Now on ECASA 81mg  plus Plavix 75 mg daily for secondary stroke prevention.     Silent CAD with no ischemia on cardiolite. Follow up with Dr. Donnie Aho as an outpatient. etoh abuse, on CIWA protocol, UDS negative  Hx pancreatitis May 2011, chronic pancreatitis d/t etoh abuse  GERD  Cigarette  smoker, 1 PPD. Has nicotine patch Hypertension  Hyperlipidemia, LDL 110, now on lipitor 80 mg daily Carotid disease, R ICA  Cellulitis at catheter insertion site. On Keflex 500 mg BID x 7 days for cellulitis. D 3/7 Abdominal pain, constipation and nausea and vomiting. Vomiting decreasing. Small BM only after enemas and ducolax. Xray shows mild ileus. On reglan q 6h, colace, protonix.  Plan:  Continue Plavix 75mg  plus baby asa 81mg  daily for secondary stroke prevention given dissection. Not a coumadin candidate due to etoh abuse/non compliance.   Discussed with GI. Will check chemistries w/ LFTs, magnesium, phosphorus, potassium, UA. Change to clear liquids to rest bowel. Follow up stools and vomiting status in the am.  OP PT and OT.   Stop tobacco and alcohol abuse.   followup Dr. Donnie Aho in 1 mo   LOS: 6 days   Annie Main, MSN, RN, ANVP-BC, ANP-BC, [facility] Pager: [phone] 06/17/2012 12:02 PM  Scribe for Dr. Delia Heady, Stroke Center Medical Director, who has personally reviewed chart, pertinent data, examined the patient and developed the plan of care. Pager:  [phone]

## 2012-06-17 NOTE — Progress Notes (Signed)
Pt. Still having no success with bowels. Order placed for mag citrate. No results as of yet. Will continue to monitor on clear liquid diet for now. Pt stable.

## 2012-06-18 DIAGNOSIS — R1110 Vomiting, unspecified: Secondary | ICD-10-CM | POA: Diagnosis present

## 2012-06-18 DIAGNOSIS — K567 Ileus, unspecified: Secondary | ICD-10-CM | POA: Diagnosis not present

## 2012-06-18 DIAGNOSIS — K5900 Constipation, unspecified: Secondary | ICD-10-CM | POA: Diagnosis present

## 2012-06-18 DIAGNOSIS — F1010 Alcohol abuse, uncomplicated: Secondary | ICD-10-CM | POA: Diagnosis present

## 2012-06-18 LAB — URINALYSIS, ROUTINE W REFLEX MICROSCOPIC
Bilirubin Urine: NEGATIVE
Glucose, UA: 250 mg/dL — AB
Hgb urine dipstick: NEGATIVE
Specific Gravity, Urine: 1.019 (ref 1.005–1.030)
pH: 7.5 (ref 5.0–8.0)

## 2012-06-18 MED ORDER — SIMVASTATIN 20 MG PO TABS
20.0000 mg | ORAL_TABLET | Freq: Every day | ORAL | Status: DC
Start: 2012-06-18 — End: 2012-06-18
  Filled 2012-06-18: qty 1

## 2012-06-18 MED ORDER — SIMVASTATIN 20 MG PO TABS: 20.0000 mg | ORAL_TABLET | Freq: Every day | ORAL | Status: DC

## 2012-06-18 MED ORDER — CLOPIDOGREL BISULFATE 75 MG PO TABS: 75.0000 mg | ORAL_TABLET | Freq: Every day | ORAL | Status: DC

## 2012-06-18 NOTE — Care Management Note (Signed)
    Page 1 of 1   06/18/2012     1:53:49 PM   CARE MANAGEMENT NOTE 06/18/2012  Patient:  CLEBURNE, SAVINI   Account Number:  [id number]  Date Initiated:  06/11/2012  Documentation initiated by:  [facility]  Subjective/Objective Assessment:   Admitted to ICU with CVA, lt sided numbness, received tPA.     Action/Plan:   PT/OT evals   Anticipated DC Date:  06/14/2012   Anticipated DC Plan:  HOME W HOME HEALTH SERVICES      DC Planning Services  CM consult      Choice offered to / List presented to:             Status of service:  Completed, signed off Medicare Important Message given?   (If response is "NO", the following Medicare IM given date fields will be blank) Date Medicare IM given:   Date Additional Medicare IM given:    Discharge Disposition:  HOME/SELF CARE  Per UR Regulation:  Reviewed for med. necessity/level of care/duration of stay  If discussed at Long Length of Stay Meetings, dates discussed:    Comments:  06/18/12 Onnie Boer, RN ,BSN 1352 PT WAS DC'D TO HOME WITH OP PT/OT [facility].  06/13/2012  1740 Contacted main pharmacy and pt is eligible for [employer] med assistance fund. Fund will cover complete dose of abx and 3 day supply of any other meds except narcotics. NCM will continue to follow until D/c. Will search for other program that will assist with Plavix.  Isidoro Donning RN CCM Case Mgmt phone [phone]

## 2012-06-18 NOTE — Progress Notes (Signed)
Pt's assessment unchanged from AM. Pt and family given D/C instructions with verbal understanding. Both received stroke education with verbal feedback. Pt D/C'd home with family @ 1155 per MD order. Rema Fendt, RN

## 2012-06-18 NOTE — Discharge Summary (Signed)
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  Past Medical History  Diagnosis Date  . Alcohol abuse   . Pancreatitis     CT findings in May 2011 with inflammation and pseudocyst  . GERD (gastroesophageal reflux disease)   . CAD (coronary artery disease) 06/13/2012    Calcification noted on CTA of chest in 2012 Wall motion abnormality on ECHO    . 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 Hyperlipidemia    History reviewed. No pertinent past surgical history.    Medication List     As of 06/18/2012 11:11 AM    TAKE these medications         aspirin 81 MG tablet   Take 81 mg by mouth daily.      clopidogrel 75 MG tablet   Commonly known as: PLAVIX   Take 1 tablet (75 mg total) by mouth daily before breakfast.      famotidine 20 MG tablet   Commonly known as: PEPCID   Take 1 tablet (20 mg total) by mouth 2 (two) times daily.      folic acid 1 MG tablet   Commonly known as: FOLVITE   Take 1 mg by mouth daily.      multivitamin capsule   Take 1 capsule by mouth daily.     simvastatin 20 MG tablet   Commonly known as: ZOCOR   Take 1 tablet (20 mg total) by mouth daily at 6 PM.      traZODone 150 MG tablet   Commonly known as: DESYREL   Take 300 mg by mouth at bedtime.        LABORATORY STUDIES CBC    Component Value Date/Time   WBC 9.1 06/15/2012 0543   RBC 4.28 06/15/2012 0543   HGB 14.9 06/15/2012 0543   HCT 43.4 06/15/2012 0543   PLT 189 06/15/2012 0543   MCV 101.4* 06/15/2012 0543   MCH 34.8* 06/15/2012 0543   MCHC 34.3 06/15/2012 0543   RDW 12.9 06/15/2012 0543   LYMPHSABS 2.8 06/11/2012 0017   MONOABS 0.8 06/11/2012 0017   EOSABS 0.1 06/11/2012 0017   BASOSABS 0.1 06/11/2012 0017   CMP    Component Value Date/Time   NA 136 06/17/2012 1525   K 3.7 06/17/2012 1525   CL 96 06/17/2012 1525   CO2 29 06/17/2012 1525   GLUCOSE 222* 06/17/2012 1525   BUN 7 06/17/2012 1525   CREATININE 0.64 06/17/2012 1525   CREATININE 0.94 02/12/2012 1419   CALCIUM 9.7 06/17/2012 1525   PROT 6.9 06/17/2012 1525   ALBUMIN 3.4* 06/17/2012 1525   AST 22 06/17/2012 1525  ALT 17 06/17/2012 1525   ALKPHOS 92 06/17/2012 1525   BILITOT 0.3 06/17/2012 1525   GFRNONAA >90 06/17/2012 1525   GFRAA >90 06/17/2012 1525   COAGS Lab Results  Component Value Date   INR 0.99 06/11/2012   Lipid Panel    Component Value Date/Time   CHOL 207* 06/11/2012 0425   TRIG 66 06/11/2012 0425   HDL 84 06/11/2012 0425   CHOLHDL 2.5 06/11/2012 0425   VLDL 13 06/11/2012 0425   LDLCALC 110* 06/11/2012 0425   HgbA1C  Lab Results  Component Value Date   HGBA1C 5.8* 06/11/2012   Cardiac Panel (last 3 results) No results found for this basename: CKTOTAL:3,CKMB:3,TROPONINI:3,RELINDX:3 in the last 72 hours Urinalysis    Component Value Date/Time   COLORURINE YELLOW 06/18/2012 0246   APPEARANCEUR CLOUDY* 06/18/2012 0246   LABSPEC 1.019 06/18/2012 0246   PHURINE 7.5 06/18/2012 0246   GLUCOSEU 250* 06/18/2012 0246   HGBUR NEGATIVE 06/18/2012 0246   BILIRUBINUR NEGATIVE 06/18/2012 0246   KETONESUR NEGATIVE  06/18/2012 0246   PROTEINUR NEGATIVE 06/18/2012 0246   UROBILINOGEN 0.2 06/18/2012 0246   NITRITE NEGATIVE 06/18/2012 0246   LEUKOCYTESUR NEGATIVE 06/18/2012 0246   Urine Drug Screen     Component Value Date/Time   LABOPIA NONE DETECTED 06/11/2012 1126   COCAINSCRNUR NONE DETECTED 06/11/2012 1126   LABBENZ NONE DETECTED 06/11/2012 1126   AMPHETMU NONE DETECTED 06/11/2012 1126   THCU NONE DETECTED 06/11/2012 1126   LABBARB NONE DETECTED 06/11/2012 1126    Alcohol Level    Component Value Date/Time   ETH 199* 06/11/2012 0017    SIGNIFICANT DIAGNOSTIC STUDIES CT of the brain 06/11/2012 No acute finding. Age advanced atrophy.  MRI HEAD 06/11/2012 Multifocal areas of acute infarction in the right hemisphere. This could represent a shower of emboli or possibly hypoperfusion.  MRA HEAD 06/11/2012 Findings consistent with recent right internal carotid artery occlusion. Collaterals reconstitute the supraclinoid ICA. Severe disease right A1 segment anterior cerebral artery.  CT angio of the neck 9/21/213 Almost complete occlusion of the right internal carotid artery with thread like appearance 1.8 cm above its origin with a string like appearance beyond this region into the level of the level of the cavernous sinus. 58% diameter stenosis proximal left internal carotid artery. Occluded right vertebral artery with reconstitution of flow at the C3 level.  Cerebral Angiogram 06/15/2012 Likely right carotid dissection extending up into intracranial segment with occlusion of middle cerebral artery; Patent left internal carotid artery with dissection (full extent not seen)  2D Echocardiogram 06/11/12 Left ventricle: There are some unusual wall motion abnormalities. There is hypokinesis at the base of the anterior septum. There is hypokinesis of the mid inferiorwall and mid inferior septum. The cavity size was mildly dilated. Wall thickness was increased in a pattern of mild LVH. The estimated ejection fraction was 55%. -  Impressions: No cardiac source of embolism was identified, but cannot be ruled out on the basis of this examination. Carotid Doppler 06/11/12  Right - Heavily calcified ICA with diminished flow proximally. There is a pre occlusion "thumping" in the mid to distal region. Vertebral artery flow is antegrade.  Left - Moderate calcification of the ICA with no obvious evidence of significant stenosis. Acoustic shadowing noted in the proximal region which may obscure higher velocities. Vertebral artery flow is antegrade  CXR 06/11/2012 Mild basilar atelectasis. Stable mild cardiomegaly.  Nuclear Myocar Multi w/ spect w/wall motion 06/14/2012 1. Minimal attenuation of the inferior wall extending towards the apex.  No definite scintigraphic evidence of prior infarction or pharmacologically induced ischemia. 2. Minimal hypokinesia involving the basilar portion of the inferior wall and septum. Slightly decreased ejection fraction - 51%.  DG Abdomen 06/16/2012 mild ileus and mild constipation    History of Present Illness  Brandon DRUMMER is an 46 y.o. male with a history of smoking who was in his normal state at 10:45 06/10/2012 as he watching TV with his wife. At 11, his wife noticed facial drooping and slurred speech and called 911. Since that time, he has continued to have slurred speech, and has noticed that his left arm and leg are numb. He states that his arm has been going "in and out" on him. He does not take any daily antiplatelet medicine. He states that he checked his blood pressure last week and it was 120 systolic. He does have a history of alcohol abuse, consuming 7-10 beers per night, and tonight he feels that he has been on the low side, around 7. Patient was a TPA candidate. He was admitted to the neuro ICU for further evaluation and treatment.   Hospital Course Patient tolerated tPA without complication. Imaging at 24 hours shows no hemorrhage. MRI confirmed ischemic infarcts in the right pareital and  insula, embolic from proximal right ICA occlusion. Dr. Darrick Penna was consulted to assess for CEA. Angiogram performed to better define anatomy. During angio a likely right carotid dissection extending up into the intracranial segment with occlusion of the MCA was identified as the cause of the stroke. He also was found to have a left ICA dissection. He was not a coumadin candidate given etoh history.  He was started on aspirin 81 mg and plavix 75 mg daily for  for secondary stroke prevention. He will need to be on the for 3 months, then one alone.    Patient with known etoh abuse. Placed on CIWA protocol on admission. CCM consulted to manage. UDS negative. No withdrawal sequela at discharge.  Echo with abnormalities. Concern for CAD led to cardiology consult. revealed silent CAD with no ischemia on cardiolite. Follow up with Dr. Donnie Aho as an outpatient.   Cigarette smoker, 1 PPD. Has nicotine patch added in hospital.  Hypertension. Normalizing. Agree with ongoing BP management after discharge.   Hyperlipidemia, LDL 110. Stroke goal < 100. Cardiology recommends < 70. Placed on  lipitor 80 mg daily in hopsital. Due to lack of insurance, will change to simvastatin 20 at time of discharge.  Cellulitis noted at catheter insertion site. redeived Keflex 500 mg BID x 7 days. Induration and tenderness resolved.  Abdominal pain, constipation and nausea and vomiting developed in hospital. Abdominal xray shows mild iedlus and constipation. No vomiting x 24 h prior to discharge. Constipation addressed. 2 large BMs within 24h of discharge.  Patient with continued stroke symptoms of left sided hemiparesis and left sided clumsiness. Physical therapy, occupational therapy and speech therapy evaluated patient. They recommend outpatient PT and OT. This has been arranged.  Discharge Exam  Blood pressure 112/82, pulse 70, temperature 97.6 F (36.4 C), temperature source Oral, resp. rate 20, height 6\' 1"  (1.854 m), weight  89.6 kg (197 lb 8.5 oz), SpO2 100.00%. Alert, oriented, thought content appropriate. Speech fluent without evidence of aphasia. Able to follow 3 step commands without difficulty.  Cranial Nerves:  II- Visual fields grossly intact.  III/IV/VI-Extraocular movements intact. Pupils reactive bilaterally.  V/VII-minimal left upper and lower facial droop.  VIII-hearing grossly intact  IX/X-normal gag  XI-bilateral shoulder shrug  XII-midline tongue extension  Motor: 5/5 bilaterally with normal tone and bulk.diminished fine finger movements on left and orbits right over left upper extremities.  Sensory: Light touch intact throughout, bilaterally  Deep Tendon Reflexes: 2+ and symmetric throughout  Plantars: Downgoing bilaterally  Cerebellar: Normal finger-to-nose, normal rapid alternating movements and normal heel-to-shin test.  Physical exam:  Lungs with decreased air movement throughout; no rhonchi or wheeze.  Cor RRR without M/G/R  Extremities: no induration or tenderness noted  Discharge Diet   Heart healthy diet, thin liquids  Discharge Plan    Disposition: home with wife  Aspirin 81 mg and plavix 75 mg daily for secondary stroke prevention x 3 months, the one alone.  Ongoing risk factor control by Primary Care Physician  OP PT and OT.  Stop tobacco and alcohol abuse.   No work x 1 week. 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  Follow-up Dr. Donnie Aho 1 month.  Follow-up Dow Adolph, MD in 1 month.  Follow-up with Dr. Delia Heady in 2 months.  Signed Annie Main, AVNP, ANP-BC, [facility] Nurse Practitioner 06/18/2012, 11:11 AM  Dr. Delia Heady, Stroke Center Medical Director, has personally reviewed chart, pertinent data, examined the patient and developed the plan of care.

## 2012-06-18 NOTE — Progress Notes (Signed)
Patient had 2 moderate stools this evening. First hard, second soft to loose. Patient reports much relief.

## 2012-06-18 NOTE — Progress Notes (Signed)
History: Brandon [name] is an 46 y.o. male with a history of smoking who was in his normal state at 10:45 06/10/2012 as he watching TV with his wife. At 14, his wife noticed facial drooping and slurred speech and called 911. Since that time, he has continued to have slurred speech, and has noticed that his left arm and leg are numb. He states that his arm has been going "in and out" on him. He does not take any daily antiplatelet medicine. He states that he checked his blood pressure last week and it was 120 systolic. He does have a history of alcohol abuse, consuming 7-10 beers per night, and tonight he feels that he has been on the low side, around 7. Patient was a TPA candidate. He was admitted to the neuro ICU for further evaluation and treatment.  LSN:10:45 06/10/2012 tPA Given: yes  Subjective: Wife at bedside. No vomiting overnight. Had 2 large BMs. Feels much better. Would like to go home.  Objective: Filed Vitals:   06/17/12 2128 06/18/12 0243 06/18/12 0612 06/18/12 1010  BP: 140/90 123/71 114/79 112/82  Pulse: 86 93 70 70  Temp: 97.4 F (36.3 C) 97.6 F (36.4 C) 98.1 F (36.7 C) 97.6 F (36.4 C)  TempSrc: Oral Oral Oral   Resp: 20 18 20 20   Height:      Weight:      SpO2: 99% 100% 94% 100%    Diet: Clear Liquid Activity: up with assist DVT Prophylaxis: SCDs ordered  Medications: Scheduled:    . aspirin EC  81 mg Oral Daily  . atorvastatin  80 mg Oral q1800  . cephALEXin  500 mg Oral Q12H  . chlordiazePOXIDE  25 mg Oral Q12H  . clopidogrel  75 mg Oral QAC breakfast  . docusate sodium  100 mg Oral Daily  . folic acid  1 mg Oral Daily  . magnesium citrate  1 Bottle Oral Once  . metoCLOPramide (REGLAN) injection  10 mg Intravenous Q8H  . multivitamin with minerals  1 tablet Oral Daily  . nicotine  21 mg Transdermal Daily  . pantoprazole  40 mg Oral Q1200  . thiamine  100 mg Oral Daily   Neurologic Exam: Mental Status: Alert, oriented, thought content  appropriate.  Speech fluent without evidence of aphasia. Able to follow 3 step commands without difficulty. Cranial Nerves: II- Visual fields grossly intact. III/IV/VI-Extraocular movements intact.  Pupils reactive bilaterally. V/VII-minimal left upper and lower facial droop. VIII-hearing grossly intact IX/X-normal gag XI-bilateral shoulder shrug XII-midline tongue extension Motor: 5/5 bilaterally with normal tone and bulk.diminished fine finger movements on left and orbits right over left upper extremities. Sensory: Light touch intact throughout, bilaterally Deep Tendon Reflexes: 2+ and symmetric throughout Plantars: Downgoing bilaterally Cerebellar: Normal finger-to-nose, normal rapid alternating movements and normal heel-to-shin test.   Physical exam: Lungs with decreased air movement throughout; no rhonchi or wheeze. Cor RRR without M/G/R Extremities: has saline locked IV's anterior forearms.  Both have local swelling and induration with mild tenderness and increased warmth.  The left has a palpable cord vs induration and red streak towards the antecubital fossa.  Lab Results: Basic Metabolic Panel:  Lab 06/17/12 4098 06/15/12 0543 06/12/12 0425  NA 136 138 --  K 3.7 3.7 --  CL 96 102 --  CO2 29 24 --  GLUCOSE 222* 156* --  BUN 7 8 --  CREATININE 0.64 0.58 --  CALCIUM 9.7 9.6 --  MG 2.3 -- 2.1  PHOS -- -- 4.0  Liver Function Tests:  Lab 06/17/12 1525 06/12/12 0425  AST 22 33  ALT 17 24  ALKPHOS 92 91  BILITOT 0.3 0.5  PROT 6.9 7.2  ALBUMIN 3.4* 3.6   CBC:  Lab 06/15/12 0543  WBC 9.1  NEUTROABS --  HGB 14.9  HCT 43.4  MCV 101.4*  PLT 189   Urine Drug Screen: Drugs of Abuse     Component Value Date/Time   LABOPIA NONE DETECTED 06/11/2012 1126   COCAINSCRNUR NONE DETECTED 06/11/2012 1126   LABBENZ NONE DETECTED 06/11/2012 1126   AMPHETMU NONE DETECTED 06/11/2012 1126   THCU NONE DETECTED 06/11/2012 1126   LABBARB NONE DETECTED 06/11/2012 1126    Study  Results:  CT of the brain 06/11/2012 No acute finding. Age advanced atrophy.   MRI HEAD  06/11/2012  Multifocal areas of acute infarction in the right hemisphere.  This could represent a shower of emboli or possibly  hypoperfusion.    MRA HEAD  06/11/2012  Findings consistent with recent right internal carotid artery occlusion.  Collaterals reconstitute the supraclinoid ICA.  Severe disease right A1 segment anterior cerebral artery.    CT angio of the neck 9/21/213 Almost complete occlusion of the right internal carotid artery with thread like appearance 1.8 cm above its origin with a string like appearance beyond this region into the level of the level of the cavernous sinus. 58% diameter stenosis proximal left internal carotid artery. Occluded right vertebral artery with reconstitution of flow at the C3 level.  Cerebral Angiogram  06/15/2012 Likely right carotid dissection extending up into intracranial segment with occlusion of middle cerebral artery; Patent left internal carotid artery with dissection (full extent not seen)  2D Echocardiogram  06/11/12  Left ventricle: There are some unusual wall motion abnormalities. There is hypokinesis at the base of the anterior septum. There is hypokinesis of the mid inferiorwall and mid inferior septum. The cavity size was mildly dilated. Wall thickness was increased in a pattern of mild LVH. The estimated ejection fraction was 55%. - Impressions: No cardiac source of embolism was identified, but cannot be ruled out on the basis of this examination.  Carotid Doppler 06/11/12  Right - Heavily calcified ICA with diminished flow proximally. There is a pre occlusion "thumping" in the mid to distal region. Vertebral artery flow is antegrade.  Left - Moderate calcification of the ICA with no obvious evidence of significant stenosis. Acoustic shadowing noted in the proximal region which may obscure higher velocities. Vertebral artery flow is antegrade  CXR 06/11/2012  Mild basilar atelectasis. Stable mild cardiomegaly.   Nuclear Myocar Multi w/ spect w/wall motion 06/14/2012  1. Minimal attenuation of the inferior wall extending towards the apex. No definite scintigraphic evidence of prior infarction or pharmacologically induced ischemia. 2. Minimal hypokinesia involving the basilar portion of the inferior wall and septum. Slightly decreased ejection fraction - 51%.  DG Abdomen 06/16/2012 mild ileus and mild constipation  Therapies: OP PT and OT recommended  Assessment/Plan: Brandon [name] is a 46 y.o. 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 Also with left ICA dissection.  Status post IV t-PA 06/11/2012 started at 0037.  On no antiplatlets prior to admission. Now on ECASA 81mg  plus Plavix 75 mg daily for secondary stroke prevention.     Silent CAD with no ischemia on cardiolite. Follow up with Dr. Donnie Aho as an outpatient. etoh  abuse, on CIWA protocol, UDS negative  Hx pancreatitis May 2011, chronic pancreatitis d/t etoh abuse  GERD  Cigarette smoker, 1 PPD. Has nicotine patch Hypertension  Hyperlipidemia, LDL 110, now on lipitor 80 mg daily Carotid disease, R ICA  Cellulitis at catheter insertion site. On Keflex 500 mg BID x 7 days for cellulitis. D 3/7 Abdominal pain, constipation and nausea and vomiting. Vomiting decreasing. Small BM only after enemas and ducolax. Xray shows mild ileus. On reglan q 6h, colace, protonix.  Plan:  Continue Plavix 75mg  plus baby asa 81mg  daily for secondary stroke prevention given dissection x 3 months then one alone.  OP PT and OT.   Stop tobacco and alcohol abuse.   No work x 1 month. 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  Resume diet  Change lipitor to simvastatin  followup Dr. Donnie Aho in 1 mo  D/W patient and girl  friend.  Discharge home   LOS: 7 days   Annie Main, MSN, RN, ANVP-BC, ANP-BC, GNP-BC [facility] Pager: [phone] 06/18/2012 10:46 AM  Scribe for Dr. Delia Heady, Stroke Center Medical Director, who has personally reviewed chart, pertinent data, examined the patient and developed the plan of care. Pager:  [phone]

## 2012-06-25 ENCOUNTER — Ambulatory Visit: Payer: Medicaid Other | Attending: Neurology | Admitting: Occupational Therapy

## 2012-06-25 ENCOUNTER — Ambulatory Visit: Payer: Medicaid Other | Admitting: Rehabilitative and Restorative Service Providers"

## 2012-06-25 ENCOUNTER — Ambulatory Visit (INDEPENDENT_AMBULATORY_CARE_PROVIDER_SITE_OTHER): Payer: Self-pay | Admitting: Internal Medicine

## 2012-06-25 ENCOUNTER — Encounter: Payer: Self-pay | Admitting: Internal Medicine

## 2012-06-25 VITALS — BP 121/79 | HR 70 | Temp 96.8°F | Ht 73.0 in | Wt 205.1 lb

## 2012-06-25 DIAGNOSIS — I6350 Cerebral infarction due to unspecified occlusion or stenosis of unspecified cerebral artery: Secondary | ICD-10-CM

## 2012-06-25 DIAGNOSIS — M2010 Hallux valgus (acquired), unspecified foot: Secondary | ICD-10-CM

## 2012-06-25 DIAGNOSIS — Z0000 Encounter for general adult medical examination without abnormal findings: Secondary | ICD-10-CM | POA: Insufficient documentation

## 2012-06-25 DIAGNOSIS — Z5189 Encounter for other specified aftercare: Secondary | ICD-10-CM | POA: Insufficient documentation

## 2012-06-25 DIAGNOSIS — K219 Gastro-esophageal reflux disease without esophagitis: Secondary | ICD-10-CM

## 2012-06-25 DIAGNOSIS — F17200 Nicotine dependence, unspecified, uncomplicated: Secondary | ICD-10-CM

## 2012-06-25 DIAGNOSIS — R279 Unspecified lack of coordination: Secondary | ICD-10-CM | POA: Insufficient documentation

## 2012-06-25 DIAGNOSIS — I639 Cerebral infarction, unspecified: Secondary | ICD-10-CM

## 2012-06-25 DIAGNOSIS — I69919 Unspecified symptoms and signs involving cognitive functions following unspecified cerebrovascular disease: Secondary | ICD-10-CM | POA: Insufficient documentation

## 2012-06-25 DIAGNOSIS — M6281 Muscle weakness (generalized): Secondary | ICD-10-CM | POA: Insufficient documentation

## 2012-06-25 DIAGNOSIS — I69998 Other sequelae following unspecified cerebrovascular disease: Secondary | ICD-10-CM | POA: Insufficient documentation

## 2012-06-25 DIAGNOSIS — R269 Unspecified abnormalities of gait and mobility: Secondary | ICD-10-CM | POA: Insufficient documentation

## 2012-06-25 MED ORDER — TRAZODONE HCL 150 MG PO TABS: 300.0000 mg | ORAL_TABLET | Freq: Every day | ORAL | Status: DC

## 2012-06-25 MED ORDER — FAMOTIDINE 20 MG PO TABS: 20.0000 mg | ORAL_TABLET | Freq: Two times a day (BID) | ORAL | Status: DC

## 2012-06-25 NOTE — Patient Instructions (Addendum)
The best thing you can do for your health, and to prevent a future stroke, is to quit smoking.  Our Social Worker, Lynnae January, is available for appointments to discuss options for smoking cessation.  I would be happy to refer you to her if you decide that you would like to talk with her.  We discussed smoking cessation strategies today, such as nicotine patches, nicotine gum, or medications.  Please review the handouts we've provided, and we can discuss this further at your next visit.  For your feet, we would like to send you to a Sports Medicine clinic for a shoe orthotic.  Please return when you have the [facility], and we can arrange this for you.  To apply for Medicaid, you can go to [employer], which is the new government website for [employer].  Please return in 3-4 months for a follow-up visit.

## 2012-06-25 NOTE — Assessment & Plan Note (Signed)
-  patient declined flu shot today 

## 2012-06-25 NOTE — Assessment & Plan Note (Signed)
Patient has already reduced his cigarette usage since hospital discharge.  He is interested in quitting altogether, but is concerned about the financial cost of nicotine patches. -patient given handouts on smoking cessation -patient informed of SW resources for smoking cessation -will try nicotine patch when financial feasible.

## 2012-06-25 NOTE — Assessment & Plan Note (Signed)
The patient presents for follow-up after an acute stroke, s/p TPA administration, and found to have bilateral carotid artery dissections.  The patient was counseled extensively on smoking cessation, and decided he would like to try nicotine patches once he received some form of health insurance (orange card vs medicaid) to help offset the cost.  Simvastatin was started during hospitalization (LDL = 110).  BP well-controlled today without medication. -will try nicotine patch for smoking cessation after patient obtains orange card -continue simvastatin -per neuro, continue aspirin and plavix for 3 months, then reduce to monotherapy with either medication (around 09/23/12) -per vascular surgery, carotid artery dissections are not amenable to CEA, will treat with anticoagulation.  Patient considered not to be a candidate for warfarin due to his chronic alcoholism.

## 2012-06-25 NOTE — Progress Notes (Signed)
HPI The patient is a 46 y.o. yo male with a history of CAD, HL, HTN, recently discharged after an acute stroke, presenting for hospital follow-up.  The patient presents for a follow-up for an acute stroke, secondary to R ICA occlusion.  The patient notes that his slurred speech, facial droop, and left leg weakness have improved significantly.  His left arm strength has been slower to recover, but still has improved somewhat.  For risk factor modification, the patient was started on simvastatin during his last admission due to an LDL of 110, and the patient's BP is well-controlled today.  The patient notes that he has "cut back" on his smoking, from about 1.5 ppd to 0.5 ppd.  He is interested in quitting, but is concerned about the cost of nicotine patches.  The patient's wife also notes concern over the patient's feet.  She states that the patient has had calluses on both feet for a long time, which cause him some discomfort while walking.  She also believes that the patient has a "bunion" on both feet.  ROS: General: no fevers, chills, changes in weight, changes in appetite Skin: no rash HEENT: no blurry vision, hearing changes, sore throat Pulm: no dyspnea, coughing, wheezing CV: no chest pain, palpitations, shortness of breath Abd: no abdominal pain, nausea/vomiting, diarrhea/constipation GU: no dysuria, hematuria, polyuria Ext: no arthralgias, myalgias Neuro: see HPI  Filed Vitals:   06/25/12 1534  BP: 121/79  Pulse: 70  Temp: 96.8 F (36 C)    PEX General: alert, cooperative, and in no apparent distress HEENT: pupils equal round and reactive to light, vision grossly intact, oropharynx clear and non-erythematous, left nasolabial fold slightly less mobile than right, speech not slurred Neck: supple, no lymphadenopathy, +bilateral carotid bruits Lungs: clear to ascultation bilaterally, normal work of respiration, no wheezes, rales, ronchi Heart: regular rate and rhythm, no murmurs,  gallops, or rubs Abdomen: soft, non-tender, non-distended, normal bowel sounds Extremities: no cyanosis, clubbing, or edema.  Bilateral feet with several large areas of skin hypertrophy (c/w callus formation), as well as bilateral medial MTP prominence (c/w hallux valgus) Neurologic: alert & oriented X3, cranial nerves II-XII intact, strength 4/5 throughout RUE, otherwise 5/5 throughout, sensation intact to light touch  Current Outpatient Prescriptions on File Prior to Visit  Medication Sig Dispense Refill  . aspirin 81 MG tablet Take 81 mg by mouth daily.        . clopidogrel (PLAVIX) 75 MG tablet Take 1 tablet (75 mg total) by mouth daily before breakfast.  30 tablet  2  . famotidine (PEPCID) 20 MG tablet Take 1 tablet (20 mg total) by mouth 2 (two) times daily.  60 tablet  6  . folic acid (FOLVITE) 1 MG tablet Take 1 mg by mouth daily.        . Multiple Vitamin (MULTIVITAMIN) capsule Take 1 capsule by mouth daily.        . simvastatin (ZOCOR) 20 MG tablet Take 1 tablet (20 mg total) by mouth daily at 6 PM.  30 tablet  2  . traZODone (DESYREL) 150 MG tablet Take 300 mg by mouth at bedtime.          Assessment/Plan

## 2012-06-25 NOTE — Assessment & Plan Note (Signed)
Examination of the patient's feet reveals several calluses, as well as bilateral hallux valgus.  Evaluation of the patient's shoes shows uneven wear of tread, with greater wear on the lateral tread than the medial tread. -patient would benefit from shoe orthotic, will refer to Sports Medicine when patient has [employer] -patient will likely need orthopedic surgical evaluation of hallux valgus.  As this is a non-emergent issue, will defer for now

## 2012-07-01 ENCOUNTER — Ambulatory Visit: Payer: Self-pay | Admitting: Physical Therapy

## 2012-07-02 ENCOUNTER — Ambulatory Visit: Payer: Medicaid Other | Admitting: Occupational Therapy

## 2012-07-08 ENCOUNTER — Ambulatory Visit: Payer: Medicaid Other | Admitting: Rehabilitative and Restorative Service Providers"

## 2012-07-08 ENCOUNTER — Ambulatory Visit: Payer: Medicaid Other | Admitting: Occupational Therapy

## 2012-07-15 ENCOUNTER — Ambulatory Visit: Payer: Medicaid Other | Admitting: Rehabilitative and Restorative Service Providers"

## 2012-07-15 ENCOUNTER — Ambulatory Visit: Payer: Medicaid Other | Admitting: Occupational Therapy

## 2012-07-16 ENCOUNTER — Encounter: Payer: Self-pay | Admitting: Occupational Therapy

## 2012-07-17 ENCOUNTER — Encounter: Payer: Self-pay | Admitting: Occupational Therapy

## 2012-07-17 ENCOUNTER — Other Ambulatory Visit: Payer: Self-pay | Admitting: *Deleted

## 2012-07-17 DIAGNOSIS — K219 Gastro-esophageal reflux disease without esophagitis: Secondary | ICD-10-CM

## 2012-07-17 MED ORDER — CLOPIDOGREL BISULFATE 75 MG PO TABS: 75.0000 mg | ORAL_TABLET | Freq: Every day | ORAL | Status: DC

## 2012-07-17 MED ORDER — SIMVASTATIN 20 MG PO TABS: 20.0000 mg | ORAL_TABLET | Freq: Every day | ORAL | Status: DC

## 2012-07-22 ENCOUNTER — Ambulatory Visit: Payer: Self-pay | Admitting: Rehabilitative and Restorative Service Providers"

## 2012-07-22 ENCOUNTER — Ambulatory Visit: Payer: Medicaid Other | Admitting: Occupational Therapy

## 2012-07-24 ENCOUNTER — Encounter: Payer: Self-pay | Admitting: Occupational Therapy

## 2012-07-29 ENCOUNTER — Encounter: Payer: Self-pay | Admitting: Speech Pathology

## 2012-07-29 ENCOUNTER — Encounter: Payer: Self-pay | Admitting: Occupational Therapy

## 2012-08-05 ENCOUNTER — Encounter: Payer: Self-pay | Admitting: Occupational Therapy

## 2012-08-05 ENCOUNTER — Encounter: Payer: Self-pay | Admitting: Speech Pathology

## 2012-08-07 ENCOUNTER — Encounter: Payer: Self-pay | Admitting: Occupational Therapy

## 2012-08-12 ENCOUNTER — Encounter: Payer: Self-pay | Admitting: Speech Pathology

## 2012-08-13 ENCOUNTER — Encounter: Payer: Self-pay | Admitting: Occupational Therapy

## 2012-08-14 ENCOUNTER — Encounter: Payer: Self-pay | Admitting: Occupational Therapy

## 2012-08-26 ENCOUNTER — Encounter: Payer: Self-pay | Admitting: Speech Pathology

## 2012-09-04 ENCOUNTER — Ambulatory Visit: Payer: Self-pay

## 2012-09-25 ENCOUNTER — Other Ambulatory Visit (HOSPITAL_COMMUNITY): Payer: Self-pay | Admitting: Neurology

## 2012-09-25 DIAGNOSIS — I639 Cerebral infarction, unspecified: Secondary | ICD-10-CM

## 2012-09-30 ENCOUNTER — Ambulatory Visit (HOSPITAL_COMMUNITY): Payer: Medicaid Other

## 2012-09-30 ENCOUNTER — Ambulatory Visit (HOSPITAL_COMMUNITY)
Admission: RE | Admit: 2012-09-30 | Discharge: 2012-09-30 | Disposition: A | Discharge status: Home / Self Care | Payer: Medicaid Other | Source: Ambulatory Visit | Attending: Neurology | Admitting: Neurology

## 2012-09-30 DIAGNOSIS — I639 Cerebral infarction, unspecified: Secondary | ICD-10-CM

## 2012-09-30 DIAGNOSIS — I6529 Occlusion and stenosis of unspecified carotid artery: Secondary | ICD-10-CM | POA: Insufficient documentation

## 2012-09-30 DIAGNOSIS — I6509 Occlusion and stenosis of unspecified vertebral artery: Secondary | ICD-10-CM | POA: Insufficient documentation

## 2012-09-30 MED ORDER — IOHEXOL 350 MG/ML SOLN
80.0000 mL | Freq: Once | INTRAVENOUS | Status: AC | PRN
  Administered 2012-09-30: 80 mL via INTRAVENOUS

## 2012-11-07 ENCOUNTER — Other Ambulatory Visit: Payer: Self-pay | Admitting: Internal Medicine

## 2012-11-09 NOTE — Telephone Encounter (Signed)
I will give him one month of Pepcid without refills until I can see him. Schedule appointment with me in one month.

## 2012-11-25 ENCOUNTER — Ambulatory Visit (INDEPENDENT_AMBULATORY_CARE_PROVIDER_SITE_OTHER): Payer: Medicaid Other | Admitting: Internal Medicine

## 2012-11-25 ENCOUNTER — Encounter: Payer: Self-pay | Admitting: Internal Medicine

## 2012-11-25 VITALS — BP 160/110 | HR 96 | Temp 97.8°F | Ht 73.0 in | Wt 216.5 lb

## 2012-11-25 DIAGNOSIS — K859 Acute pancreatitis without necrosis or infection, unspecified: Secondary | ICD-10-CM

## 2012-11-25 DIAGNOSIS — F17200 Nicotine dependence, unspecified, uncomplicated: Secondary | ICD-10-CM

## 2012-11-25 DIAGNOSIS — I639 Cerebral infarction, unspecified: Secondary | ICD-10-CM

## 2012-11-25 DIAGNOSIS — K7689 Other specified diseases of liver: Secondary | ICD-10-CM

## 2012-11-25 DIAGNOSIS — I10 Essential (primary) hypertension: Secondary | ICD-10-CM

## 2012-11-25 DIAGNOSIS — I2510 Atherosclerotic heart disease of native coronary artery without angina pectoris: Secondary | ICD-10-CM

## 2012-11-25 DIAGNOSIS — K760 Fatty (change of) liver, not elsewhere classified: Secondary | ICD-10-CM

## 2012-11-25 DIAGNOSIS — F1010 Alcohol abuse, uncomplicated: Secondary | ICD-10-CM

## 2012-11-25 DIAGNOSIS — Z23 Encounter for immunization: Secondary | ICD-10-CM

## 2012-11-25 DIAGNOSIS — R5383 Other fatigue: Secondary | ICD-10-CM

## 2012-11-25 DIAGNOSIS — R5381 Other malaise: Secondary | ICD-10-CM

## 2012-11-25 DIAGNOSIS — E785 Hyperlipidemia, unspecified: Secondary | ICD-10-CM

## 2012-11-25 LAB — LIPID PANEL
HDL: 72 mg/dL (ref >39)
LDL Cholesterol: 89 mg/dL (ref 0–99)
Triglycerides: 82 mg/dL (ref <150)
VLDL: 16 mg/dL (ref 0–40)

## 2012-11-25 LAB — VITAMIN B12: Vitamin B-12: 412 pg/mL (ref 211–911)

## 2012-11-25 MED ORDER — HYDROCHLOROTHIAZIDE 12.5 MG PO TABS: 12.5000 mg | ORAL_TABLET | Freq: Every day | ORAL | Status: DC

## 2012-11-25 MED ORDER — FAMOTIDINE 20 MG PO TABS: 20.0000 mg | ORAL_TABLET | Freq: Every evening | ORAL | Status: DC | PRN

## 2012-11-25 NOTE — Patient Instructions (Addendum)
Please start taking Hydrochlorothiazide 12.5 mg once daily  Please come back in 1 week  I will call you if there     Treatment Goals:  Goals (1 Years of Data) as of 11/25/12   None      Progress Toward Treatment Goals:  Treatment Goal 11/25/2012  Blood pressure deteriorated  Stop smoking smoking the same amount    Self Care Goals & Plans:  Self Care Goal 11/25/2012  Manage my medications take my medicines as prescribed  Stop smoking go to the [employer] ([employer]); call [facility] (1-800-QUIT-NOW)       Care Management & Community Referrals:  Referral 11/25/2012  Referrals made to community resources smoking cessation

## 2012-11-26 ENCOUNTER — Encounter: Payer: Self-pay | Admitting: Internal Medicine

## 2012-11-26 ENCOUNTER — Telehealth: Payer: Self-pay | Admitting: *Deleted

## 2012-11-26 DIAGNOSIS — R5383 Other fatigue: Secondary | ICD-10-CM | POA: Insufficient documentation

## 2012-11-26 DIAGNOSIS — F1010 Alcohol abuse, uncomplicated: Secondary | ICD-10-CM

## 2012-11-26 MED ORDER — FAMOTIDINE 20 MG PO TABS: 20.0000 mg | ORAL_TABLET | Freq: Two times a day (BID) | ORAL | Status: DC | PRN

## 2012-11-26 NOTE — Assessment & Plan Note (Signed)
Will aim for moderate intensity statin therapy with current dose of Zocor 20 mg daily.

## 2012-11-26 NOTE — Assessment & Plan Note (Signed)
Patient still wants to try quitting. I have given him information on quit-now number so that he can get free nicotine patches.

## 2012-11-26 NOTE — Assessment & Plan Note (Addendum)
Will try to investigate further cause of his fatigue. Most likely multifactorial, including alcohol, recent stroke, and medication side effects. He does not look anemic.   Plan -Will measure vitamin B12, homocysteine levels, and methylmalonate levels  -will do cbc to exclude anemia  - will do TSH - Will follow up in 1 week

## 2012-11-26 NOTE — Assessment & Plan Note (Signed)
Brandon [name] continues to drink heavily with his last drink being yesterday when he had 8 beer cans of 12 ounces. He looks tremulous. I have counseled the patient about alcohol abuse cessation. Patient is motivated to try more attempts to quit alcohol.   Plan -Will refer to social work to identify resources in the community for alcohol dependence treatment

## 2012-11-26 NOTE — Assessment & Plan Note (Signed)
I will continue with simvastatin 20 mg once daily.

## 2012-11-26 NOTE — Telephone Encounter (Signed)
Call from pt stating refill on pepcid 20 mg was only 1 daily, he usually takes twice a day.  Please advise. # [phone]

## 2012-11-26 NOTE — Progress Notes (Signed)
Patient ID: Brandon [name], male   DOB: 1966-01-13, 47 y.o.   MRN: [id number]  Subjective:   Patient ID: Brandon [name] male   DOB: 1966/02/20 47 y.o.   MRN: [id number]  HPI: Brandon [name] is a 47 y.o. with past medical history of alcohol abuse, ischemic stroke due to right internal carotid occlusion, chronic pancreatitis, and coronary artery disease, presents with fatigue for about 3 weeks. He is accompanied by his wife. Most of the history is provided by the wife as the patient contributes very little. Reportedly, he has tried to be out walking and working but she has noticed that he gets fatigued easily. No history of passing out. No history of fevers or chills. No cough. No shortness of breath as per the wife. He also appears to be very fatigued in the morning when he has just woken up and his speech is sometimes slurred which she attributes to stroke. Patient has some residual left-sided weakness from the stroke in September 2013 however, is able to ambulate without assistance. As per wife, the patient continues to drink excessive amounts of alcohol with his last drink being yesterday when he drank 8 cans of beer.  No history of visual changes, no seizures, no tinnitus, or auditory deficits. The patient does not report any history of lightheadedness.  The wife is also concerned about some swelling on the forearm bilaterally , which she notes a few days ago. No history of trauma to this area. The patient denies pain in this particular area.    Past Medical History  Diagnosis Date  . Alcohol abuse   . Pancreatitis     CT findings in May 2011 with inflammation and pseudocyst  . GERD (gastroesophageal reflux disease)   . CAD (coronary artery disease) 06/13/2012    Calcification noted on CTA of chest in 2012 Wall motion abnormality on ECHO    . 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 Hyperlipidemia    Current Outpatient Prescriptions  Medication Sig Dispense Refill  . aspirin 81  MG tablet Take 81 mg by mouth daily.        . clopidogrel (PLAVIX) 75 MG tablet Take 1 tablet (75 mg total) by mouth daily before breakfast.  90 tablet  2  . famotidine (PEPCID) 20 MG tablet Take 1 tablet (20 mg total) by mouth at bedtime as needed for heartburn.  90 tablet  11  . folic acid (FOLVITE) 1 MG tablet Take 1 mg by mouth daily.        . hydrochlorothiazide (HYDRODIURIL) 12.5 MG tablet Take 1 tablet (12.5 mg total) by mouth daily.  30 tablet  0  . Multiple Vitamin (MULTIVITAMIN) capsule Take 1 capsule by mouth daily.        . simvastatin (ZOCOR) 20 MG tablet Take 1 tablet (20 mg total) by mouth daily at 6 PM.  90 tablet  2  . traZODone (DESYREL) 150 MG tablet Take 2 tablets (300 mg total) by mouth at bedtime.  60 tablet  2   No current facility-administered medications for this visit.   Family History  Problem Relation Age of Onset  . Stroke Mother     deceased  . Coronary artery disease Mother   . Alcohol abuse Mother   . Cancer Mother   . Hypertension Father     alive  . Alcohol abuse Father   . Diabetes Father   . Kidney disease Father    History   Social History  .  Marital Status: Single    Spouse Name: [name]    Number of Children: 0  . Years of Education: N/A   Occupational History  . unemployed   .  [employer]   Social History Main Topics  . Smoking status: Current Every Day Smoker -- 0.30 packs/day for 27 years    Types: Cigarettes  . Smokeless tobacco: None     Comment: smoking less  . Alcohol Use: No  . Drug Use: No  . Sexually Active: None   Other Topics Concern  . None   Social History Narrative   Lives in [place] with his wife   Unemployed, previously worked Surveyor, minerals down [employer]   Completed high school and some college    Review of Systems: Constitutional: Denies fever, chills, diaphoresis, appetite change.  HEENT: Denies photophobia, eye pain, redness, hearing loss, ear pain, congestion, sore throat, rhinorrhea, sneezing,  mouth sores, trouble swallowing, neck pain, neck stiffness and tinnitus.   Respiratory: Denies SOB, DOE, cough, chest tightness,  and wheezing.   Cardiovascular: Denies chest pain, palpitations and leg swelling.  Gastrointestinal: Denies nausea, diarrhea, constipation, blood in stool and abdominal distention. However, he reports some epigastric pain, and one episode of vomiting, clear liquid 2 days prior.  Genitourinary: Denies dysuria, urgency, frequency, hematuria, flank pain and difficulty urinating.  Musculoskeletal: Denies myalgias, back pain, joint swelling, arthralgias.   Skin: Denies pallor, rash and wound.  Neurological: Denies dizziness, seizures, syncope, weakness, light-headedness, numbness and headaches.  he has residual weakness on the left side since the stroke. However, the patient is able to ambulate without assistance. He is very independent, without any assistance with activities of daily living. Hematological: Denies adenopathy. Easy bruising, personal or family bleeding history  Psychiatric/Behavioral: Denies suicidal ideation, mood changes, confusion, nervousness, sleep disturbance and agitation  Objective:  Physical Exam: Filed Vitals:   11/25/12 1557 11/25/12 1654  BP: 181/111 160/110  Pulse: 96   Temp: 97.8 F (36.6 C)   TempSrc: Oral   Height: 6\' 1"  (1.854 m)   Weight: 216 lb 8 oz (98.204 kg)   SpO2: 100%    Constitutional: Vital signs reviewed.  Patient is a well-developed and well-nourished in no acute distress and cooperative with exam. Alert and oriented x3. He is tremulous Head: Normocephalic and atraumatic Ear: TM normal bilaterally Mouth: no erythema or exudates, MMM Eyes: PERRL, EOMI, conjunctivae normal, No scleral icterus.  Neck: Supple, Trachea midline normal ROM, No JVD, mass, thyromegaly, or carotid bruit present.  Cardiovascular: RRR, S1 normal, S2 normal, no MRG, pulses symmetric and intact bilaterally Pulmonary/Chest: CTAB, no wheezes, rales,  or rhonchi Abdominal: Soft. Non-tender, non-distended, bowel sounds are normal, no masses, organomegaly, or guarding present.  GU: no CVA tenderness Musculoskeletal: No joint deformities, erythema, or stiffness, ROM full and no nontender.  Left forearm demonstrates a subcutaneous swelling of about 3 cm in diameter, it is nontender, soft. Overlying skin does not have a erythema, warmth, or any discoloration. It is freely mobile. No ecchymosis or petechia. Hematology: no cervical, inginal, or axillary adenopathy.  Neurological: A&O x3,  reduced strength to 3-4/5 on the left side Both upper and lower extremities.  cranial nerve II-XII are grossly intact, no focal motor deficit, sensory intact to light touch bilaterally.  Skin: Warm, dry and intact. No rash, cyanosis, or clubbing.  Psychiatric: Normal mood and affect. speech and behavior is normal. Judgment and thought content normal. Cognition and memory are normal.   Assessment & Plan:   I have  discussed my assessment, and plan for the care of Brandon [name] with Dr. Kem Kays as detailed under my problem based charting.  In brief, the main focus for this visit is evaluation of his fatigue, and elevated blood pressure. Lipid panel, vitamin B12 level, homocystine, methylmalonic acid, and the basic metabolic panel of been ordered. We will also a CBC and A1C level. He will be initiated on a small dose of hydrochlorothiazide 12.5 mg once daily. Social work referral has been done to assess the patient with treatment for alcohol dependence. His records from the neurologist office will be obtained to sort out his antiplatelet therapy for secondary prevention of stroke. He will followup in one week.

## 2012-11-26 NOTE — Assessment & Plan Note (Signed)
His left-sided weakness is unchanged. Patient only had a few sessions of physical therapy as outpatient due to cost. He is in the process of applying for Medicaid. Wife performs PT at home. Patient unable to return to work. Seen at neurology in January 2014 by Dr Gershon Crane.  Plan -Encouraged the wife to continue with home physical therapy. -I will refer to social worker to see if there are any resources that he can get for continuing physical therapy. -Will obtain records from Dr. Marlis Edelson office specifically to determine recommendations on Plavix and aspirin. The patient does not have anemia or any history of GI bleeding. For now we can continue with both as previously recommended. - Will need to monitor his hemoglobin closely. We will do a CBC.

## 2012-11-26 NOTE — Assessment & Plan Note (Signed)
BP Readings from Last 3 Encounters:  11/25/12 160/110  06/25/12 121/79  06/18/12 112/82    Lab Results  Component Value Date   NA 136 06/17/2012   K 3.7 06/17/2012   CREATININE 0.64 06/17/2012    Assessment:  Blood pressure control: severely elevated  Progress toward BP goal:  deteriorated  Comments: Blood pressure likely to be secondary to alcohol withdrawal. Patient had taken 8 to be as on the previous day.  Plan:  Medications:  Will start him on a small dose of hydrochlorothiazide 12.5 mg daily.  Educational resources provided:    Self management tools provided: home blood pressure logbook  Other plans: Discussed with Dr.Paya about this situation. Will follow in one week.

## 2012-11-30 ENCOUNTER — Telehealth: Payer: Self-pay | Admitting: Licensed Clinical Social Worker

## 2012-11-30 ENCOUNTER — Encounter: Payer: Self-pay | Admitting: Internal Medicine

## 2012-11-30 NOTE — Telephone Encounter (Signed)
Brandon [name] was referred to CSW for referrals to substance abuse treatment for uninsured.  There are several agencies in [facility] that provide services on a sliding fee scale: [facility] [place] [phone], [facility] [phone].  CSW placed call to [name].  Pt denies any need for substance abuse or alcohol services.  CSW will sign off.

## 2012-12-11 ENCOUNTER — Encounter: Payer: Self-pay | Admitting: Neurology

## 2012-12-11 DIAGNOSIS — I6350 Cerebral infarction due to unspecified occlusion or stenosis of unspecified cerebral artery: Secondary | ICD-10-CM | POA: Insufficient documentation

## 2012-12-11 DIAGNOSIS — I63239 Cerebral infarction due to unspecified occlusion or stenosis of unspecified carotid arteries: Secondary | ICD-10-CM | POA: Insufficient documentation

## 2012-12-11 DIAGNOSIS — I7771 Dissection of carotid artery: Secondary | ICD-10-CM

## 2012-12-22 ENCOUNTER — Other Ambulatory Visit: Payer: Self-pay | Admitting: Internal Medicine

## 2012-12-23 ENCOUNTER — Telehealth: Payer: Self-pay | Admitting: *Deleted

## 2012-12-23 NOTE — Telephone Encounter (Signed)
Left a message for the patient to call the office and confirm appointment on 12-24-12 to see CM the nurse practitioner.

## 2012-12-24 ENCOUNTER — Ambulatory Visit: Payer: Self-pay | Admitting: Nurse Practitioner

## 2012-12-24 NOTE — Telephone Encounter (Signed)
Patient no showed

## 2012-12-29 ENCOUNTER — Ambulatory Visit (INDEPENDENT_AMBULATORY_CARE_PROVIDER_SITE_OTHER): Payer: Medicaid Other | Admitting: Internal Medicine

## 2012-12-29 ENCOUNTER — Encounter: Payer: Self-pay | Admitting: Internal Medicine

## 2012-12-29 VITALS — BP 142/79 | HR 101 | Temp 97.9°F | Ht 73.0 in | Wt 217.5 lb

## 2012-12-29 DIAGNOSIS — G4700 Insomnia, unspecified: Secondary | ICD-10-CM

## 2012-12-29 DIAGNOSIS — F1010 Alcohol abuse, uncomplicated: Secondary | ICD-10-CM

## 2012-12-29 DIAGNOSIS — I10 Essential (primary) hypertension: Secondary | ICD-10-CM

## 2012-12-29 MED ORDER — TRAZODONE HCL 100 MG PO TABS: 100.0000 mg | ORAL_TABLET | Freq: Every day | ORAL | Status: DC

## 2012-12-29 MED ORDER — TRAZODONE HCL 150 MG PO TABS: 150.0000 mg | ORAL_TABLET | Freq: Every day | ORAL | Status: DC

## 2012-12-29 MED ORDER — HYDROCHLOROTHIAZIDE 12.5 MG PO TABS: 12.5000 mg | ORAL_TABLET | Freq: Every day | ORAL | Status: DC

## 2012-12-29 NOTE — Progress Notes (Signed)
   Patient: Brandon [name]   MRN: [id number]  DOB: Dec 01, 1965  PCP: Dow Adolph, MD   Subjective:    HPI: [name] is a 47 y.o. male with a PMHx as outlined below, who presented to clinic today for the following:  1) HTN - Patient does check blood pressure regularly at home - 130/80s mmHg. Currently taking hydrochlorothiazide 12.5mg  daily. Patient misses doses 0 x per week on average. denies headaches, dizziness, lightheadedness, chest pain, shortness of breath.  does request refills today.  2) Insomnia - difficulty with falling and staying asleep. Sleeps 5-6 hours a night. Watches TV until he falls asleep. Is on trazodone chronically - written for 300mg , however, typically only taking 150mg  and not even every night.   Review of Systems: Per HPI.   Current Outpatient Medications: Medication Sig  . aspirin 81 MG tablet Take 81 mg by mouth daily.    . clopidogrel (PLAVIX) 75 MG tablet Take 1 tablet (75 mg total) by mouth daily before breakfast.  . famotidine (PEPCID) 20 MG tablet Take 1 tablet (20 mg total) by mouth 2 (two) times daily as needed for heartburn.  . folic acid (FOLVITE) 1 MG tablet Take 1 mg by mouth daily.    . hydrochlorothiazide (HYDRODIURIL) 12.5 MG tablet Take 1 tablet (12.5 mg total) by mouth daily.  . Multiple Vitamin (MULTIVITAMIN) capsule Take 1 capsule by mouth daily.    . simvastatin (ZOCOR) 20 MG tablet Take 1 tablet (20 mg total) by mouth daily at 6 PM.  . traZODone (DESYREL) 150 MG tablet Take 2 tablets (300 mg total) by mouth at bedtime.    Allergies: Allergies  Allergen Reactions  . Other Swelling    [employer] - Delirium    Past Medical History  Diagnosis Date  . Alcohol abuse   . Pancreatitis     CT findings in May 2011 with inflammation and pseudocyst  . GERD (gastroesophageal reflux disease)   . CAD (coronary artery disease) 06/13/2012    Calcification noted on CTA of chest in 2012 Wall motion abnormality on ECHO    . 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 Hyperlipidemia   . Headache     migraine  . Heart disease   . Depression   . Anxiety   . Hypertension      Objective:    Physical Exam: Filed Vitals:   12/29/12 1548  BP: 142/79  Pulse: 101  Temp: 97.9 F (36.6 C)     General: Vital signs reviewed and noted. Well-developed, well-nourished, in no acute distress; alert, appropriate and cooperative throughout examination.  Head: Normocephalic, atraumatic.  Lungs:  Normal respiratory effort. Clear to auscultation BL without crackles or wheezes.  Heart: RRR. S1 and S2 normal without gallop, rubs. no murmur.  Abdomen:  BS normoactive. Soft, Nondistended, non-tender.  No masses or organomegaly.  Extremities: No pretibial edema.   Assessment/ Plan:   The patient's case and plan of care was discussed with attending physician, Dr. Aletta Edouard.

## 2012-12-29 NOTE — Patient Instructions (Addendum)
General Instructions:  Please follow-up at the clinic in 1 month with your PCP (or at his next available), at which time we will reevaluate your blood pressure - OR, please follow-up in the clinic sooner if needed.  There have been changes in your medications:  DECREASE your trazodone to 1 pill at bedtime as needed  Keep taking your same blood pressure medication as prescribed - hydrochlorothiazide    If you have been started on new medication(s), and you develop symptoms concerning for allergic reaction, including, but not limited to, throat closing, tongue swelling, rash, please stop the medication immediately and call the clinic at [phone], and go to the ER.  If symptoms worsen, or new symptoms arise, please call the clinic or go to the ER.  PLEASE BRING ALL OF YOUR MEDICATIONS  IN A BAG TO YOUR NEXT APPOINTMENT   Treatment Goals:  Goals (1 Years of Data) as of 12/29/12         As of Today 11/25/12 11/25/12 09/24/12 06/25/12     Blood Pressure    . Blood Pressure < 140/90  142/79 160/110 181/111 132/96 121/79     Result Component    . LDL CALC < 130   89       . LDL CALC < 70   89         Progress Toward Treatment Goals:  Treatment Goal 11/25/2012  Blood pressure deteriorated  Stop smoking smoking the same amount    Self Care Goals & Plans:  Self Care Goal 12/29/2012  Manage my medications take my medicines as prescribed; refill my medications on time  Monitor my health keep track of my weight; keep track of my blood pressure  Eat healthy foods eat foods that are low in salt; eat more vegetables  Be physically active park at the far end of the parking lot; take a walk every day; take the stairs instead of the elevator  Stop smoking set a quit date and stop smoking     Care Management & Community Referrals:  Referral 11/25/2012  Referrals made to community resources smoking cessation       Insomnia Insomnia is frequent trouble falling and/or staying asleep. Insomnia  can be a long term problem or a short term problem. Both are common. Insomnia can be a short term problem when the wakefulness is related to a certain stress or worry. Long term insomnia is often related to ongoing stress during waking hours and/or poor sleeping habits. Overtime, sleep deprivation itself can make the problem worse. Every little thing feels more severe because you are overtired and your ability to cope is decreased. CAUSES   Stress, anxiety, and depression.  Poor sleeping habits.  Distractions such as TV in the bedroom.  Naps close to bedtime.  Engaging in emotionally charged conversations before bed.  Technical reading before sleep.  Alcohol and other sedatives. They may make the problem worse. They can hurt normal sleep patterns and normal dream activity.  Stimulants such as caffeine for several hours prior to bedtime.  Pain syndromes and shortness of breath can cause insomnia.  Exercise late at night.  Changing time zones may cause sleeping problems (jet lag). It is sometimes helpful to have someone observe your sleeping patterns. They should look for periods of not breathing during the night (sleep apnea). They should also look to see how long those periods last. If you live alone or observers are uncertain, you can also be observed at a sleep clinic  where your sleep patterns will be professionally monitored. Sleep apnea requires a checkup and treatment. Give your caregivers your medical history. Give your caregivers observations your family has made about your sleep.  SYMPTOMS   Not feeling rested in the morning.  Anxiety and restlessness at bedtime.  Difficulty falling and staying asleep. TREATMENT   Your caregiver may prescribe treatment for an underlying medical disorders. Your caregiver can give advice or help if you are using alcohol or other drugs for self-medication. Treatment of underlying problems will usually eliminate insomnia problems.  Medications  can be prescribed for short time use. They are generally not recommended for lengthy use.  Over-the-counter sleep medicines are not recommended for lengthy use. They can be habit forming.  You can promote easier sleeping by making lifestyle changes such as:  Using relaxation techniques that help with breathing and reduce muscle tension.  Exercising earlier in the day.  Changing your diet and the time of your last meal. No night time snacks.  Establish a regular time to go to bed.  Counseling can help with stressful problems and worry.  Soothing music and white noise may be helpful if there are background noises you cannot remove.  Stop tedious detailed work at least one hour before bedtime. HOME CARE INSTRUCTIONS   Keep a diary. Inform your caregiver about your progress. This includes any medication side effects. See your caregiver regularly. Take note of:  Times when you are asleep.  Times when you are awake during the night.  The quality of your sleep.  How you feel the next day. This information will help your caregiver care for you.  Get out of bed if you are still awake after 15 minutes. Read or do some quiet activity. Keep the lights down. Wait until you feel sleepy and go back to bed.  Keep regular sleeping and waking hours. Avoid naps.  Exercise regularly.  Avoid distractions at bedtime. Distractions include watching television or engaging in any intense or detailed activity like attempting to balance the household checkbook.  Develop a bedtime ritual. Keep a familiar routine of bathing, brushing your teeth, climbing into bed at the same time each night, listening to soothing music. Routines increase the success of falling to sleep faster.  Use relaxation techniques. This can be using breathing and muscle tension release routines. It can also include visualizing peaceful scenes. You can also help control troubling or intruding thoughts by keeping your mind occupied  with boring or repetitive thoughts like the old concept of counting sheep. You can make it more creative like imagining planting one beautiful flower after another in your backyard garden.  During your day, work to eliminate stress. When this is not possible use some of the previous suggestions to help reduce the anxiety that accompanies stressful situations. MAKE SURE YOU:   Understand these instructions.  Will watch your condition.  Will get help right away if you are not doing well or get worse. Document Released: 09/06/2000 Document Revised: 12/02/2011 Document Reviewed: 10/07/2007 [facility] Patient Information 2013 [place], [place].

## 2012-12-29 NOTE — Assessment & Plan Note (Signed)
Assessment: Ongoing daily alcohol consumption of 6-12 beers daily, last 1PM on day of visit. Per PCP record, seems refused substance abuse counseling. But he is open to mental health services.  Plan:      SW referral for establishing with mental health services.  Strongly recommended decreasing alcohol consumption.

## 2012-12-29 NOTE — Assessment & Plan Note (Signed)
Assessment: Patient reports being on Trazodone for last several years. He is using it intermittently when needed and not always taking the entire 300mg  daily. My concern is that this is a high dose in combination with his daily alcohol usage. Pt has chronically been on this medication while being chronically a daily alcohol drinker. We discussed sleep hygiene as well and he is agreeable to try to implement.   Plan:      Will refill trazodone at only 150mg  qHS PRN - with advisory that do not take more than prescribed and do not take with alcohol, drive, or operate heavy machinery.  Handout regarding sleep hygiene provided.

## 2012-12-29 NOTE — Assessment & Plan Note (Signed)
Pertinent Data: BP Readings from Last 3 Encounters:  12/29/12 142/79  09/24/12 132/96  11/25/12 160/110    Basic Metabolic Panel:    Component Value Date/Time   NA 136 06/17/2012 1525   K 3.7 06/17/2012 1525   CL 96 06/17/2012 1525   CO2 29 06/17/2012 1525   BUN 7 06/17/2012 1525   CREATININE 0.64 06/17/2012 1525   CREATININE 0.94 02/12/2012 1419   GLUCOSE 222* 06/17/2012 1525   CALCIUM 9.7 06/17/2012 1525    Assessment: Disease Control:  Controlled  Progress toward goals:  At goal  Barriers to meeting goals: no barriers identified    Started on HCTZ in 11/2012 - taking regularly without missing doses.  As previously mentioned, high elevated BP likely also affected by his ongoing alcohol and tobacco usage.   Repeat BP was at goal today - therefore, I will not adjust his medication regimen further, as I fear that given his ongoing alcohol abuse, he may at times get more volume deplete. Therefore, I would not like to give excessive diuretics if not needed.     Plan:  continue current medications  Refill given today.  Consider check BMET next visit.  Encourage smoking and alcohol cessation  Educational resources provided:    Self management tools provided: home blood pressure logbook

## 2012-12-31 ENCOUNTER — Telehealth: Payer: Self-pay | Admitting: Licensed Clinical Social Worker

## 2012-12-31 ENCOUNTER — Encounter: Payer: Self-pay | Admitting: Internal Medicine

## 2012-12-31 NOTE — Telephone Encounter (Signed)
Brandon [name] was referred to CSW for mental health referrals.  CSW placed call to Brandon [name].  Pt states he is currently living in [place] but also in [place].  Pt voiced familiarity with [facility] services and location.  CSW also provided Brandon [name] with the address to [facility] of the [place].  Pt informed both locations utilize a sliding fee scale and have walk-in hours from 8am - 3pm.  CSW unable to schedule appt on behalf of pt, as both agencies require walk-in for establishment of care.  Brandon [name] is aware CSW is available to assist as needed and denies add'l needs at this time.

## 2013-01-04 NOTE — Progress Notes (Signed)
I have discussed this case with Dr. Reynolds , read the documentation and I agree with the plan of care. Please see the resident note for details of management.  

## 2013-02-23 ENCOUNTER — Other Ambulatory Visit: Payer: Self-pay | Admitting: Internal Medicine

## 2013-03-27 ENCOUNTER — Other Ambulatory Visit: Payer: Self-pay | Admitting: Internal Medicine

## 2013-04-24 ENCOUNTER — Other Ambulatory Visit: Payer: Self-pay | Admitting: Internal Medicine

## 2013-05-27 ENCOUNTER — Ambulatory Visit: Payer: Medicaid Other | Admitting: Internal Medicine

## 2013-05-27 ENCOUNTER — Encounter: Payer: Self-pay | Admitting: Internal Medicine

## 2013-07-27 ENCOUNTER — Telehealth: Payer: Self-pay | Admitting: Neurology

## 2013-08-03 NOTE — Telephone Encounter (Signed)
I called patient and left VM to let him know his letter was signed and two CT reports attached. Please come by office, sign release and make $20.00 payment. Forms will be at front desk.

## 2013-08-23 ENCOUNTER — Other Ambulatory Visit: Payer: Self-pay | Admitting: *Deleted

## 2013-08-23 DIAGNOSIS — G4700 Insomnia, unspecified: Secondary | ICD-10-CM

## 2013-08-25 MED ORDER — TRAZODONE HCL 150 MG PO TABS
150.0000 mg | ORAL_TABLET | Freq: Every day | ORAL | Status: DC
Start: 2013-08-23 — End: 2016-03-29

## 2013-09-28 ENCOUNTER — Encounter: Payer: Self-pay | Admitting: Internal Medicine

## 2013-10-20 ENCOUNTER — Encounter (INDEPENDENT_AMBULATORY_CARE_PROVIDER_SITE_OTHER): Payer: Self-pay

## 2013-10-20 ENCOUNTER — Ambulatory Visit (INDEPENDENT_AMBULATORY_CARE_PROVIDER_SITE_OTHER): Payer: Medicaid Other | Admitting: Nurse Practitioner

## 2013-10-20 ENCOUNTER — Encounter: Payer: Self-pay | Admitting: Nurse Practitioner

## 2013-10-20 VITALS — BP 125/88 | HR 89 | Ht 72.0 in | Wt 205.0 lb

## 2013-10-20 DIAGNOSIS — G4486 Cervicogenic headache: Secondary | ICD-10-CM

## 2013-10-20 DIAGNOSIS — R519 Headache, unspecified: Secondary | ICD-10-CM

## 2013-10-20 DIAGNOSIS — R51 Headache: Secondary | ICD-10-CM

## 2013-10-20 DIAGNOSIS — I63239 Cerebral infarction due to unspecified occlusion or stenosis of unspecified carotid arteries: Secondary | ICD-10-CM

## 2013-10-20 DIAGNOSIS — M542 Cervicalgia: Secondary | ICD-10-CM | POA: Insufficient documentation

## 2013-10-20 DIAGNOSIS — F17200 Nicotine dependence, unspecified, uncomplicated: Secondary | ICD-10-CM

## 2013-10-20 MED ORDER — TIZANIDINE HCL 2 MG PO TABS: 4.0000 mg | ORAL_TABLET | Freq: Three times a day (TID) | ORAL | Status: DC | PRN

## 2013-10-20 NOTE — Patient Instructions (Signed)
Start Zanaflex (tizanadine) for neck tightness and to relieve headaches.  Start taking 1 tablet at bedtime for 1 week and then increase to 1 tablet morning and night as needed.  Side effects are drowsiness and possibly dizziness.  Do neck stretching exercises every day to try to relieve neck tension.  We are ordering repeat MRI of the head and MRA of the neck to see if there are any changes.  Follow up in 4-6 weeks, sooner as needed.

## 2013-10-20 NOTE — Progress Notes (Signed)
PATIENT: Brandon [name] DOB: 23-Aug-1966   REASON FOR VISIT: follow up for carotid dissection with occlusion HISTORY FROM: patient  HISTORY OF PRESENT ILLNESS: 09/24/12 (PS):  [name] is a 48 year old Caucasian male seen for followup after hospital admission for stroke on 06/10/2012. He presented with sudden onset of left facial droop and slurred speech and left arm and left numbness. He was given IV TPA without complications. He has showed significant improvement except for persistent left-sided numbness. MRI scan of the brain showed only small right parietal and insular cortex infarct. Carotid ultrasound showed heavily calcified right internal carotid artery with occlusion. Left side showed moderate calcification without significant stenosis. CT angiogram showed near occlusion of the right proximal ICA with trickle flow beyond. 2-D echo showed unusual wall motion abnormalities with hypokinesis of the anterior septum. Ejection fraction of 55%. Nuclear cardiac scan showed minimum hypokinesia involving the inferior wall and septum with 51% ejection fraction and no cardiac source of embolism. Patient had cerebral catheter angiogram on 06/15/2012 by Dr. 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 Patient was found to have hyperlipidemia, hypertension and smoking as vascular factors. He was started on aspirin and Plavix and states he has done well. His speech difficulties have improved and facial droop as well. He still has some mild paresthesias in the left hand. 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 He has applied for Medicaid and application is pending. He has not been doing outpatient physical and occupational  therapy as he has no insurance. He has cut back smoking but not completely quit yet. His blood pressure today is 132/96.  10/20/13 (LL): Patient returns for 1 year follow up.  Since last visit 1 year ago he has had a moderate recovery but was not able to return to work due to easy fatigueability and cognitive difficulties.  Repeat  CT angiogram Head and Neck showed no change in 50 % stenosis of the left ICA in the bulb region with occlusion of the right vertebral artery.  Complete occlusion of the right ICA at the bulb.  Reconstituted flow in the right ICA at the skull base. Flow in both distal vertebral arteries with the right being quite small.  No basilar stenosis.  No posterior circulation branch vessel stenosis or occlusion.  He continues to smoke and reports that he has tried many times to quit.  He tried Chantix but due to mood changes and agitation had to stop.  He complains of near every day headaches that are incapacitating.  They are described as "all-over", not unilateral, pounding in quality with sharp shooting pains coming up from the occipital area.  He has tried tramadol for these headaches, with some relief but he takes them more frequently than ordered which has led to stomach irritation.  REVIEW OF SYSTEMS: Full 14 system review of systems performed and notable only for:  Appetite change, fatigue, neck pain, ringing in ears, runny nose, double vision, shortness of breath, chest tightness, chest pain, abdominal pain, black stools, insomnia, daytime sleepiness, sleep talking, back pain, walking difficulty, swollen lymph nodes, memory loss, dizziness, headache, speech difficulty, weakness, agitation, confusion, depression, anxiety.  ALLERGIES: Allergies  Allergen Reactions  . Other  Swelling    [employer] - Delirium    HOME MEDICATIONS: Outpatient Prescriptions Prior to Visit  Medication Sig Dispense Refill  . aspirin 81 MG tablet Take 81 mg by mouth daily.        . clopidogrel  (PLAVIX) 75 MG tablet TAKE 1 TABLET BY MOUTH DAILY BEFORE BREAKFAST  90 tablet  2  . famotidine (PEPCID) 20 MG tablet Take 1 tablet (20 mg total) by mouth 2 (two) times daily as needed for heartburn.  180 tablet  6  . folic acid (FOLVITE) 1 MG tablet Take 1 mg by mouth daily.        . hydrochlorothiazide (MICROZIDE) 12.5 MG capsule TAKE 1 CAPSULE BY MOUTH ONCE DAILY  90 capsule  3  . simvastatin (ZOCOR) 20 MG tablet TAKE 1 TABLET BY MOUTH DAILY AT 6PM  90 tablet  3  . traZODone (DESYREL) 150 MG tablet Take 1 tablet (150 mg total) by mouth at bedtime.  30 tablet  1  . Multiple Vitamin (MULTIVITAMIN) capsule Take 1 capsule by mouth daily.         No facility-administered medications prior to visit.   Marland Kitchen. traMADol (ULTRAM) 50 MG tablet    Sig: Take by mouth as needed.  . Pancrelipase, Lip-Prot-Amyl, (ZENPEP) 25000 UNITS CPEP    Sig: Take by mouth daily. Take 2-3 capsule with meals daily.  Marland Kitchen. albuterol (PROVENTIL) (2.5 MG/3ML) 0.083% nebulizer solution    Sig: Take 2.5 mg by nebulization as needed for wheezing or shortness of breath.  . lubiprostone (AMITIZA) 24 MCG capsule    Sig: Take 24 mcg by mouth as needed for constipation.  . B Complex Vitamins (VITAMIN-B COMPLEX PO)    Sig: Take 800 mg by mouth daily.    PAST MEDICAL HISTORY: Past Medical History  Diagnosis Date  . Alcohol abuse   . Pancreatitis     CT findings in May 2011 with inflammation and pseudocyst  . GERD (gastroesophageal reflux disease)   . CAD (coronary artery disease) 06/13/2012    Calcification noted on CTA of chest in 2012 Wall motion abnormality on ECHO    . 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 Hyperlipidemia   . Headache(784.0)     migraine  . Heart disease   . Depression   . Anxiety   . Hypertension     PAST SURGICAL HISTORY: Past Surgical History  Procedure Laterality Date  . None      FAMILY HISTORY: Family History  Problem Relation Age of Onset  .  Stroke Mother     deceased  . Coronary artery disease Mother   . Alcohol abuse Mother   . Cancer Mother   . Hypertension Father     alive  . Alcohol abuse Father   . Diabetes Father   . Kidney disease Father     SOCIAL HISTORY: History   Social History  . Marital Status: Single    Spouse Name: [name]    Number of Children: 0  . Years of Education: 12   Occupational History  . unemployed     previously worked at [employer]   Social History Main Topics  . Smoking status: Current Every Day Smoker -- 1.00 packs/day for 27 years    Types: Cigarettes  . Smokeless tobacco: Not on file     Comment: smoking less  . Alcohol Use: No     Comment: drinks 6-12 beer daily  .  Drug Use: No  . Sexual Activity: Not on file   Other Topics Concern  . Not on file   Social History Narrative   Lives in [place] with his wife   Unemployed, previously worked Surveyor, minerals down [employer]   Completed high school and some college      PHYSICAL EXAM  Filed Vitals:   10/20/13 1534  BP: 125/88  Pulse: 89  Height: 6' (1.829 m)  Weight: 205 lb (92.987 kg)   Body mass index is 27.8 kg/(m^2).  Generalized: Well developed, in no acute distress, ANXIOUS, AVOIDS EYE CONTACT Head: normocephalic and atraumatic. Oropharynx benign  Neck: Supple, no carotid bruits  Cardiac: Regular rate rhythm, no murmur  Musculoskeletal: No deformity   Neurological examination  Mentation: Alert oriented to time, place, history taking. Follows all commands speech and language fluent Cranial nerve II-XII:  Pupils were equal round reactive to light extraocular movements were full, visual field were full on confrontational test. Facial sensation and strength were normal. hearing was intact to finger rubbing bilaterally. Uvula tongue midline. head turning and shoulder shrug and were normal and symmetric.Tongue protrusion into cheek strength was normal. Motor: normal bulk and tone, full strength in the BUE, BLE,  no pronator drift. Diminished fine finger movements on the left.  Minimum left grip weakness. Orbits right over left approximately. Sensory: normal and symmetric to light touch, pinprick, and vibration, except slight decrease in left upper extremity. Coordination: finger-nose-finger, heel-to-shin bilaterally, no dysmetria Gait and Station: Rising up from seated position without assistance, normal stance, without trunk ataxia, moderate stride, good arm swing, smooth turning, able to perform tiptoe, heel walking and tandem without difficulty. Reflexes:  Deep tendon reflexes in the upper and lower extremities are present and symmetric.    ASSESSMENT AND PLAN Mr. Daughety is a 48 year old Caucasian male with a right insular and frontal cortex infarct secondary to embolization from proximal right internal carotid artery dissection with occlusion in the neck with moderate left proximal internal carotid artery focal dissection as well as chronically occluded right vertebral artery. Vascular risk factors of hypertension, cardiac disease, hyperlipidemia and smoking.  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  Plan:  MRI Brain Wo Contrast to assess headaches. MRI cervical spine Wo Contrast to evaluate for cause of neck pain.   Carotid dopplers to assess Left ICA. Start Zanaflex 1-2 tablets as needed for neck pain and headache. Continue aspirin and Plavix for secondary stroke prevention with strict control of hypertension with blood pressure goal below 130/90. Hyperlipidemia with LDL cholesterol goal below 100 mg percent. I have advised the patient to quit smoking completely and counseled him to do so. Return for followup in 6 months.  Orders Placed This Encounter  Procedures  . MR Brain Wo Contrast  . MR Cervical Spine Wo Contrast  . US Carotid Duplex Bilateral   Meds ordered this encounter  Medications  . tiZANidine (ZANAFLEX) 2 MG tablet    Sig: Take 2 tablets (4 mg total) by mouth every  8 (eight) hours as needed.    Dispense:  90 tablet    Refill:  11    Order Specific Question:  Supervising Provider    Answer:  Patterson Hammersmith LAM, MSN, NP-C 10/20/2013, 5:28 PM [facility] [address], [address] [place], [place] [address] [phone]  Note: This document was prepared with digital dictation and possible smart phrase technology. Any transcriptional errors that result from this process are unintentional.

## 2013-11-04 ENCOUNTER — Ambulatory Visit
Admission: RE | Admit: 2013-11-04 | Discharge: 2013-11-04 | Disposition: A | Discharge status: Home / Self Care | Payer: Medicaid Other | Source: Ambulatory Visit | Attending: Nurse Practitioner | Admitting: Nurse Practitioner

## 2013-11-04 DIAGNOSIS — R519 Headache, unspecified: Secondary | ICD-10-CM

## 2013-11-04 DIAGNOSIS — I63239 Cerebral infarction due to unspecified occlusion or stenosis of unspecified carotid arteries: Secondary | ICD-10-CM

## 2013-11-04 DIAGNOSIS — R51 Headache: Secondary | ICD-10-CM

## 2013-11-04 DIAGNOSIS — M542 Cervicalgia: Secondary | ICD-10-CM

## 2013-11-11 ENCOUNTER — Other Ambulatory Visit: Payer: Self-pay | Admitting: Nurse Practitioner

## 2013-11-11 DIAGNOSIS — I63239 Cerebral infarction due to unspecified occlusion or stenosis of unspecified carotid arteries: Secondary | ICD-10-CM

## 2013-11-11 DIAGNOSIS — I7771 Dissection of carotid artery: Secondary | ICD-10-CM

## 2013-11-12 NOTE — Progress Notes (Signed)
Quick Note:  I called pt and relayed the results of the MRI's. Pt stated wanted to do conservative treatment. I relayed the doppler results again too. He has f/u appt in 12-02-13 (confirmed). Told him about repeating the doppler in 6 mo to follow. Both his wife and pt verbalized understanding. ______

## 2013-11-12 NOTE — Progress Notes (Signed)
Quick Note:  Called and shared carotid results with patient per Ms Lam's findings. He verbalized understanding ______

## 2013-12-02 ENCOUNTER — Ambulatory Visit (INDEPENDENT_AMBULATORY_CARE_PROVIDER_SITE_OTHER): Payer: Medicaid Other | Admitting: Nurse Practitioner

## 2013-12-02 ENCOUNTER — Encounter (INDEPENDENT_AMBULATORY_CARE_PROVIDER_SITE_OTHER): Payer: Self-pay

## 2013-12-02 ENCOUNTER — Encounter: Payer: Self-pay | Admitting: Nurse Practitioner

## 2013-12-02 VITALS — BP 120/82 | HR 84 | Ht 71.75 in | Wt 197.0 lb

## 2013-12-02 DIAGNOSIS — F329 Major depressive disorder, single episode, unspecified: Secondary | ICD-10-CM

## 2013-12-02 DIAGNOSIS — F3289 Other specified depressive episodes: Secondary | ICD-10-CM

## 2013-12-02 DIAGNOSIS — IMO0002 Reserved for concepts with insufficient information to code with codable children: Secondary | ICD-10-CM

## 2013-12-02 DIAGNOSIS — G4486 Cervicogenic headache: Secondary | ICD-10-CM

## 2013-12-02 DIAGNOSIS — R51 Headache: Secondary | ICD-10-CM

## 2013-12-02 DIAGNOSIS — I6529 Occlusion and stenosis of unspecified carotid artery: Secondary | ICD-10-CM

## 2013-12-02 DIAGNOSIS — I6350 Cerebral infarction due to unspecified occlusion or stenosis of unspecified cerebral artery: Secondary | ICD-10-CM

## 2013-12-02 MED ORDER — CYCLOBENZAPRINE HCL 10 MG PO TABS: 10.0000 mg | ORAL_TABLET | Freq: Three times a day (TID) | ORAL | Status: DC | PRN

## 2013-12-02 MED ORDER — DIVALPROEX SODIUM ER 500 MG PO TB24: 500.0000 mg | ORAL_TABLET | Freq: Every day | ORAL | Status: DC

## 2013-12-02 NOTE — Progress Notes (Signed)
Brandon [name]: Brandon [name] DOB: 12-Jun-1966  REASON FOR VISIT: follow up for carotid dissection with occlusion  HISTORY FROM: Brandon [name]  HISTORY OF PRESENT ILLNESS:  09/24/12 (PS): Brandon [name] is a 48 year old Caucasian male seen for followup after hospital admission for stroke on 06/10/2012. He presented with sudden onset of left facial droop and slurred speech and left arm and left numbness. He was given IV TPA without complications. He has showed significant improvement except for persistent left-sided numbness. MRI scan of the brain showed only small right parietal and insular cortex infarct. Carotid ultrasound showed heavily calcified right internal carotid artery with occlusion. Left side showed moderate calcification without significant stenosis. CT angiogram showed near occlusion of the right proximal ICA with trickle flow beyond. 2-D echo showed unusual wall motion abnormalities with hypokinesis of the anterior septum. Ejection fraction of 55%. Nuclear cardiac scan showed minimum hypokinesia involving the inferior wall and septum with 51% ejection fraction and no cardiac source of embolism. Brandon [name] had cerebral catheter angiogram on 06/15/2012 by Dr. 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 Brandon [name] was found to have hyperlipidemia, hypertension and smoking as vascular factors. He was started on aspirin and Plavix and states he has done well. His speech difficulties have improved and facial droop as well. He still has some mild paresthesias in the left hand. 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 He has applied for Medicaid and application is pending. He has not been doing outpatient physical and occupational  therapy as he has no insurance. He has cut back smoking but not completely quit yet. His blood pressure today is 132/96.   10/20/13 (LL): Brandon [name] returns for 1 year follow up. Since last visit 1 year ago he has had a moderate recovery but was not able to return to work due to easy fatigueability and cognitive difficulties. Repeat CT angiogram Head and Neck showed no change in 50 % stenosis of the left ICA in the bulb region with occlusion of the right vertebral artery. Complete occlusion of the right ICA at the bulb. Reconstituted flow in the right ICA at the skull base. Flow in both distal vertebral arteries with the right being quite small. No basilar stenosis. No posterior circulation branch vessel stenosis or occlusion. He continues to smoke and reports that he has tried many times to quit. He tried Chantix but due to mood changes and agitation had to stop. He complains of near every day headaches that are incapacitating. They are described as "all-over", not unilateral, pounding in quality with sharp shooting pains coming up from the occipital area. He has tried tramadol for these headaches, with some relief but he takes them more frequently than ordered which has led to stomach irritation.   12/02/13 (LL): Brandon [name] returns for follow up.  His MRI brain shows Mild diffuse and mild-moderate perisylvian atrophy, moderate ventriculomegaly.  MRI cervical spine revealed mild stenosis and disc bulging at several levels. His carotid doppler study was unchanged from previous.  Since last visit his headaches have continued, essentially unchanged.  His wife states he has mood swings, is easily agitated.  He is still smoking.  He has not gotten any relief from headaches from Tizanidine, it only  makes him tired.  He has used a family member's Flexeril, which worked better, and helped him rest.  REVIEW OF SYSTEMS: Full 14 system review of systems performed and notable only for:  Appetite change, fatigue, neck pain,  ringing in ears, runny nose, double vision, shortness of breath, chest tightness, chest pain, abdominal pain, black stools, insomnia, daytime sleepiness, sleep talking, back pain, walking difficulty, swollen lymph nodes, memory loss, dizziness, headache, speech difficulty, weakness, agitation, confusion, depression, anxiety.   ALLERGIES: No Known Allergies  HOME MEDICATIONS: Outpatient Prescriptions Prior to Visit  Medication Sig Dispense Refill  . albuterol (PROVENTIL) (2.5 MG/3ML) 0.083% nebulizer solution Take 2.5 mg by nebulization as needed for wheezing or shortness of breath.      Marland Kitchen aspirin 81 MG tablet Take 81 mg by mouth daily.        . B Complex Vitamins (VITAMIN-B COMPLEX PO) Take 800 mg by mouth daily.      . clopidogrel (PLAVIX) 75 MG tablet TAKE 1 TABLET BY MOUTH DAILY BEFORE BREAKFAST  90 tablet  2  . famotidine (PEPCID) 20 MG tablet Take 1 tablet (20 mg total) by mouth 2 (two) times daily as needed for heartburn.  180 tablet  6  . folic acid (FOLVITE) 1 MG tablet Take 1 mg by mouth daily.        . hydrochlorothiazide (MICROZIDE) 12.5 MG capsule TAKE 1 CAPSULE BY MOUTH ONCE DAILY  90 capsule  3  . lubiprostone (AMITIZA) 24 MCG capsule Take 24 mcg by mouth as needed for constipation.      . Pancrelipase, Lip-Prot-Amyl, (ZENPEP) 25000 UNITS CPEP Take by mouth daily. Take 2-3 capsule with meals daily.      . simvastatin (ZOCOR) 20 MG tablet TAKE 1 TABLET BY MOUTH DAILY AT 6PM  90 tablet  3  . traMADol (ULTRAM) 50 MG tablet Take by mouth as needed.      . traZODone (DESYREL) 150 MG tablet Take 1 tablet (150 mg total) by mouth at bedtime.  30 tablet  1  . tiZANidine (ZANAFLEX) 2 MG tablet Take 2 tablets (4 mg total) by mouth every 8 (eight) hours as needed.  90 tablet  11   No facility-administered medications prior to visit.   PHYSICAL EXAM  Filed Vitals:   12/02/13 1536  BP: 120/82  Pulse: 84  Height: 5' 11.75" (1.822 m)  Weight: 197 lb (89.359 kg)   Body mass index is  26.92 kg/(m^2).  Generalized: Well developed, in no acute distress, ANXIOUS, AVOIDS EYE CONTACT  Head: normocephalic and atraumatic. Oropharynx benign  Neck: Supple, no carotid bruits  Cardiac: Regular rate rhythm, no murmur  Musculoskeletal: No deformity   Neurological examination  Mentation: Alert oriented to time, place, history taking. Follows all commands speech and language fluent  Cranial nerve II-XII: Pupils were equal round reactive to light extraocular movements were full, visual field were full on confrontational test. Facial sensation and strength were normal. hearing was intact to finger rubbing bilaterally. Uvula tongue midline. head turning and shoulder shrug and were normal and symmetric.Tongue protrusion into cheek strength was normal.  Motor: normal bulk and tone, full strength in the BUE, BLE, no pronator drift. Diminished fine finger movements on the left. Minimum left grip weakness. Orbits right over left approximately.  Sensory: normal and symmetric to light touch, pinprick, and vibration, except slight decrease in left upper extremity.  Coordination: finger-nose-finger, heel-to-shin bilaterally, no dysmetria  Gait and Station: Rising up from seated position without assistance, normal stance,  without trunk ataxia, moderate stride, good arm swing, smooth turning, able to perform tiptoe, heel walking without difficulty. Tandem unsteady. Reflexes: Deep tendon reflexes in the upper and lower extremities are present and symmetric.   DIAGNOSTIC DATA (LABS, IMAGING, TESTING) - I reviewed Brandon [name] records, labs, notes, testing and imaging myself where available.  11/04/13 Abnormal MRI cervical spine (without) demonstrating:  1. At C3-4. C4-5, C5-6: disc bulging and uncovertebral joint hypertrophy with mild spinal stenosis and severe biforaminal foraminal stenosis; no cord signal abnormalities  2. At T2-3: disc bulging with mild right and moderate left foraminal stenosis   Abnormal  MRI brain (without) demonstrating:  1. Mild diffuse and mild-moderate perisylvian atrophy. Moderate ventriculomegaly.  2. Mild chronic small vessel ischemic disease.  3. No acute findings.  4. No new findings compared to MRI on 06/11/12.   ASSESSMENT AND PLAN Brandon [name] is a 48 year old Caucasian male with a right insular and frontal cortex infarct secondary to embolization from proximal right internal carotid artery dissection with occlusion in the neck with moderate left proximal internal carotid artery focal dissection as well as chronically occluded right vertebral artery. Vascular risk factors of hypertension, cardiac disease, hyperlipidemia and smoking. 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   Plan:  Change to Flexeril 10 mg as needed for muscle spasm, neck pain.  May take every 8 hours as needed. Start Depakote ER 500 mg, 1 tablet daily for Headache prevention and mood stability. Continue aspirin and Plavix for secondary stroke prevention with strict control of hypertension with blood pressure goal below 130/90. Hyperlipidemia with LDL cholesterol goal below 100 mg percent. I have advised the Brandon [name] to quit smoking completely and counseled him to do so.  Return for followup in 6 months, check lipids, CMET then  Meds ordered this encounter  Medications  . divalproex (DEPAKOTE ER) 500 MG 24 hr tablet    Sig: Take 1 tablet (500 mg total) by mouth daily.    Dispense:  30 tablet    Refill:  6    Order Specific Question:  Supervising Provider    Answer:  Micki Riley [[id number]]  . cyclobenzaprine (FLEXERIL) 10 MG tablet    Sig: Take 1 tablet (10 mg total) by mouth 3 (three) times daily as needed for muscle spasms.    Dispense:  90 tablet    Refill:  5    Order Specific Question:  Supervising Provider    Answer:  Micki Riley [[id number]]   Return in about 6 months (around 06/04/2014).  Brandon Fear, MSN, NP-C 12/02/2013, 8:42 PM [facility] [address], [address] [place], [place] [address] [phone]  Note: This document was prepared with digital dictation and possible smart phrase technology. Any transcriptional errors that result from this process are unintentional.

## 2013-12-02 NOTE — Patient Instructions (Signed)
Change to Flexeril 10 mg as needed for muscle spasm, neck pain.  May take every 8 hours as needed.  Start Depakote ER 500 mg, 1 tablet daily for Headache prevention and mood stability.  Follow up in 6 months, sooner as needed, call with any questions.

## 2014-02-16 ENCOUNTER — Encounter (HOSPITAL_COMMUNITY): Payer: Self-pay | Admitting: Emergency Medicine

## 2014-02-16 ENCOUNTER — Emergency Department (HOSPITAL_COMMUNITY)
Admission: EM | Admit: 2014-02-16 | Discharge: 2014-02-17 | Disposition: A | Discharge status: Home / Self Care | Payer: Medicaid Other | Attending: Emergency Medicine | Admitting: Emergency Medicine

## 2014-02-16 ENCOUNTER — Emergency Department (HOSPITAL_COMMUNITY): Payer: Medicaid Other

## 2014-02-16 DIAGNOSIS — K859 Acute pancreatitis without necrosis or infection, unspecified: Secondary | ICD-10-CM | POA: Insufficient documentation

## 2014-02-16 DIAGNOSIS — Z7982 Long term (current) use of aspirin: Secondary | ICD-10-CM | POA: Insufficient documentation

## 2014-02-16 DIAGNOSIS — F411 Generalized anxiety disorder: Secondary | ICD-10-CM | POA: Insufficient documentation

## 2014-02-16 DIAGNOSIS — F17200 Nicotine dependence, unspecified, uncomplicated: Secondary | ICD-10-CM | POA: Insufficient documentation

## 2014-02-16 DIAGNOSIS — E785 Hyperlipidemia, unspecified: Secondary | ICD-10-CM | POA: Insufficient documentation

## 2014-02-16 DIAGNOSIS — Z8673 Personal history of transient ischemic attack (TIA), and cerebral infarction without residual deficits: Secondary | ICD-10-CM | POA: Insufficient documentation

## 2014-02-16 DIAGNOSIS — K219 Gastro-esophageal reflux disease without esophagitis: Secondary | ICD-10-CM | POA: Insufficient documentation

## 2014-02-16 DIAGNOSIS — F3289 Other specified depressive episodes: Secondary | ICD-10-CM | POA: Insufficient documentation

## 2014-02-16 DIAGNOSIS — I10 Essential (primary) hypertension: Secondary | ICD-10-CM | POA: Insufficient documentation

## 2014-02-16 DIAGNOSIS — R000 Tachycardia, unspecified: Secondary | ICD-10-CM | POA: Insufficient documentation

## 2014-02-16 DIAGNOSIS — Z7902 Long term (current) use of antithrombotics/antiplatelets: Secondary | ICD-10-CM | POA: Insufficient documentation

## 2014-02-16 DIAGNOSIS — Z79899 Other long term (current) drug therapy: Secondary | ICD-10-CM | POA: Insufficient documentation

## 2014-02-16 DIAGNOSIS — I2510 Atherosclerotic heart disease of native coronary artery without angina pectoris: Secondary | ICD-10-CM | POA: Insufficient documentation

## 2014-02-16 DIAGNOSIS — F329 Major depressive disorder, single episode, unspecified: Secondary | ICD-10-CM | POA: Insufficient documentation

## 2014-02-16 LAB — COMPREHENSIVE METABOLIC PANEL
ALT: 13 U/L (ref 0–53)
AST: 25 U/L (ref 0–37)
Albumin: 3 g/dL — ABNORMAL LOW (ref 3.5–5.2)
Alkaline Phosphatase: 141 U/L — ABNORMAL HIGH (ref 39–117)
BUN: 5 mg/dL — ABNORMAL LOW (ref 6–23)
CALCIUM: 9.1 mg/dL (ref 8.4–10.5)
CO2: 23 mEq/L (ref 19–32)
CREATININE: 0.56 mg/dL (ref 0.50–1.35)
Chloride: 95 mEq/L — ABNORMAL LOW (ref 96–112)
GFR calc Af Amer: >90 mL/min (ref >90)
GFR calc non Af Amer: >90 mL/min (ref >90)
Glucose, Bld: 141 mg/dL — ABNORMAL HIGH (ref 70–99)
Potassium: 3.9 mEq/L (ref 3.7–5.3)
SODIUM: 135 meq/L — AB (ref 137–147)
Total Bilirubin: 0.3 mg/dL (ref 0.3–1.2)
Total Protein: 7.9 g/dL (ref 6.0–8.3)

## 2014-02-16 LAB — URINALYSIS, ROUTINE W REFLEX MICROSCOPIC
Bilirubin Urine: NEGATIVE
GLUCOSE, UA: NEGATIVE mg/dL
Hgb urine dipstick: NEGATIVE
Ketones, ur: NEGATIVE mg/dL
LEUKOCYTES UA: NEGATIVE
Nitrite: NEGATIVE
Protein, ur: NEGATIVE mg/dL
Specific Gravity, Urine: 1.012 (ref 1.005–1.030)
UROBILINOGEN UA: 0.2 mg/dL (ref 0.0–1.0)
pH: 5 (ref 5.0–8.0)

## 2014-02-16 LAB — CBC WITH DIFFERENTIAL/PLATELET
BASOS ABS: 0.1 10*3/uL (ref 0.0–0.1)
Basophils Relative: 1 % (ref 0–1)
EOS ABS: 0.1 10*3/uL (ref 0.0–0.7)
EOS PCT: 1 % (ref 0–5)
HCT: 34.4 % — ABNORMAL LOW (ref 39.0–52.0)
Hemoglobin: 10.6 g/dL — ABNORMAL LOW (ref 13.0–17.0)
Lymphocytes Relative: 17 % (ref 12–46)
Lymphs Abs: 1.8 10*3/uL (ref 0.7–4.0)
MCH: 24.5 pg — AB (ref 26.0–34.0)
MCHC: 30.8 g/dL (ref 30.0–36.0)
MCV: 79.6 fL (ref 78.0–100.0)
Monocytes Absolute: 0.8 10*3/uL (ref 0.1–1.0)
Monocytes Relative: 8 % (ref 3–12)
Neutro Abs: 7.8 10*3/uL — ABNORMAL HIGH (ref 1.7–7.7)
Neutrophils Relative %: 73 % (ref 43–77)
Platelets: 655 10*3/uL — ABNORMAL HIGH (ref 150–400)
RBC: 4.32 MIL/uL (ref 4.22–5.81)
RDW: 18.2 % — AB (ref 11.5–15.5)
WBC: 10.6 10*3/uL — AB (ref 4.0–10.5)

## 2014-02-16 LAB — LIPASE, BLOOD: Lipase: 73 U/L — ABNORMAL HIGH (ref 11–59)

## 2014-02-16 MED ORDER — OXYCODONE-ACETAMINOPHEN 5-325 MG PO TABS: 1.0000 | ORAL_TABLET | Freq: Four times a day (QID) | ORAL | Status: DC | PRN

## 2014-02-16 MED ORDER — ONDANSETRON HCL 4 MG/2ML IJ SOLN
4.0000 mg | Freq: Once | INTRAMUSCULAR | Status: AC
  Administered 2014-02-16: 4 mg via INTRAVENOUS
  Filled 2014-02-16: qty 2

## 2014-02-16 MED ORDER — IOHEXOL 300 MG/ML  SOLN
100.0000 mL | Freq: Once | INTRAMUSCULAR | Status: AC | PRN
Start: 2014-02-16 — End: 2014-02-16
  Administered 2014-02-16: 100 mL via INTRAVENOUS

## 2014-02-16 MED ORDER — HYDROMORPHONE HCL PF 1 MG/ML IJ SOLN
1.0000 mg | Freq: Once | INTRAMUSCULAR | Status: AC
  Administered 2014-02-16: 1 mg via INTRAVENOUS
  Filled 2014-02-16: qty 1

## 2014-02-16 MED ORDER — ONDANSETRON HCL 4 MG PO TABS: 4.0000 mg | ORAL_TABLET | Freq: Four times a day (QID) | ORAL | Status: DC

## 2014-02-16 MED ORDER — IOHEXOL 300 MG/ML  SOLN
25.0000 mL | Freq: Once | INTRAMUSCULAR | Status: AC | PRN
  Administered 2014-02-16: 25 mL via ORAL

## 2014-02-16 NOTE — ED Notes (Signed)
Reported to Dr. Micheline Maze that patient requests PO fluids, and that CT results were back.  MD at the bedside to see the patient.

## 2014-02-16 NOTE — Discharge Instructions (Signed)
Acute Pancreatitis °Acute pancreatitis is a disease in which the pancreas becomes suddenly inflamed. The pancreas is a large gland located behind your stomach. The pancreas produces enzymes that help digest food. The pancreas also releases the hormones glucagon and insulin that help regulate blood sugar. Damage to the pancreas occurs when the digestive enzymes from the pancreas are activated and begin attacking the pancreas before being released into the intestine. Most acute attacks last a couple of days and can cause serious complications. Some people become dehydrated and develop low blood pressure. In severe cases, bleeding into the pancreas can lead to shock and can be life-threatening. The lungs, heart, and kidneys may fail. °CAUSES  °Pancreatitis can happen to anyone. In some cases, the cause is unknown. Most cases are caused by: °· Alcohol abuse. °· Gallstones. °Other less common causes are: °· Certain medicines. °· Exposure to certain chemicals. °· Infection. °· Damage caused by an accident (trauma). °· Abdominal surgery. °SYMPTOMS  °· Pain in the upper abdomen that may radiate to the back. °· Tenderness and swelling of the abdomen. °· Nausea and vomiting. °DIAGNOSIS  °Your caregiver will perform a physical exam. Blood and stool tests may be done to confirm the diagnosis. Imaging tests may also be done, such as X-rays, CT scans, or an ultrasound of the abdomen. °TREATMENT  °Treatment usually requires a stay in the hospital. Treatment may include: °· Pain medicine. °· Fluid replacement through an intravenous line (IV). °· Placing a tube in the stomach to remove stomach contents and control vomiting. °· Not eating for 3 or 4 days. This gives your pancreas a rest, because enzymes are not being produced that can cause further damage. °· Antibiotic medicines if your condition is caused by an infection. °· Surgery of the pancreas or gallbladder. °HOME CARE INSTRUCTIONS  °· Follow the diet advised by your  caregiver. This may involve avoiding alcohol and decreasing the amount of fat in your diet. °· Eat smaller, more frequent meals. This reduces the amount of digestive juices the pancreas produces. °· Drink enough fluids to keep your urine clear or pale yellow. °· Only take over-the-counter or prescription medicines as directed by your caregiver. °· Avoid drinking alcohol if it caused your condition. °· Do not smoke. °· Get plenty of rest. °· Check your blood sugar at home as directed by your caregiver. °· Keep all follow-up appointments as directed by your caregiver. °SEEK MEDICAL CARE IF:  °· You do not recover as quickly as expected. °· You develop new or worsening symptoms. °· You have persistent pain, weakness, or nausea. °· You recover and then have another episode of pain. °SEEK IMMEDIATE MEDICAL CARE IF:  °· You are unable to eat or keep fluids down. °· Your pain becomes severe. °· You have a fever or persistent symptoms for more than 2 to 3 days. °· You have a fever and your symptoms suddenly get worse. °· Your skin or the white part of your eyes turn yellow (jaundice). °· You develop vomiting. °· You feel dizzy, or you faint. °· Your blood sugar is high (over 300 mg/dL). °MAKE SURE YOU:  °· Understand these instructions. °· Will watch your condition. °· Will get help right away if you are not doing well or get worse. °Document Released: 09/09/2005 Document Revised: 03/10/2012 Document Reviewed: 12/19/2011 °[employer]® Patient Information ©2014 [employer]. ° °

## 2014-02-16 NOTE — ED Notes (Signed)
Dr. Docherty at the bedside.  

## 2014-02-16 NOTE — ED Notes (Signed)
Gave patient ginger ale as fluid challenge.

## 2014-02-16 NOTE — ED Notes (Signed)
Called Dr. Micheline Maze in regards to pain medication.  MD orders 1mg  of Dilaudid and 4mg  of zofran once now for pain management. Will enter order.  Updated patient.  Patient is currently drinking contrast.

## 2014-02-16 NOTE — ED Notes (Signed)
Pt reports that he was scheduled for a colonoscopy for left sided abd pain. Reports that he has a hx of GI Bleed and the pain has become worse. Reports that they called the GI office and was told to come here.

## 2014-02-16 NOTE — ED Notes (Signed)
Called CT and spoke to [name] to let them know patient has finished drinking contrast.

## 2014-02-16 NOTE — ED Notes (Signed)
Bloodwork collected by Kathreen Cornfield, EMT

## 2014-02-16 NOTE — ED Provider Notes (Signed)
CSN: [id number]     Arrival date & time 02/16/14  1720 History   First MD Initiated Contact with Patient 02/16/14 1927     Chief Complaint  Patient presents with  . Abdominal Pain     (Consider location/radiation/quality/duration/timing/severity/associated sxs/prior Treatment) Patient is a 48 y.o. male presenting with abdominal pain. The history is provided by the patient. No language interpreter was used.  Abdominal Pain Pain location:  LUQ Pain quality: sharp   Pain radiates to:  Does not radiate Pain severity:  Moderate Onset quality:  Unable to specify Duration:  4 weeks Timing:  Constant Progression:  Worsening Chronicity:  New Context: alcohol use (Heavy ETOH 4 weeks ago, had 2-3 beers a "few days' ago.)   Context: not recent illness, not sick contacts and not trauma   Relieved by:  Nothing Worsened by:  Eating Ineffective treatments: tramadol. Associated symptoms: anorexia, nausea and vomiting   Associated symptoms: no chest pain, no chills, no constipation, no cough, no diarrhea, no dysuria, no fatigue, no fever and no shortness of breath   Risk factors: alcohol abuse   Risk factors: has not had multiple surgeries     Past Medical History  Diagnosis Date  . Alcohol abuse   . Pancreatitis     CT findings in May 2011 with inflammation and pseudocyst  . GERD (gastroesophageal reflux disease)   . CAD (coronary artery disease) 06/13/2012    Calcification noted on CTA of chest in 2012 Wall motion abnormality on ECHO    . 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 Hyperlipidemia   . Headache(784.0)     migraine  . Heart disease   . Depression   . Anxiety   . Hypertension    Past Surgical History  Procedure Laterality Date  . None     Family History  Problem Relation Age of Onset  . Stroke Mother     deceased  . Coronary artery disease Mother   . Alcohol abuse Mother   . Cancer Mother   . Hypertension Father     alive   . Alcohol abuse Father   . Diabetes Father   . Kidney disease Father    History  Substance Use Topics  . Smoking status: Current Every Day Smoker -- 1.00 packs/day for 27 years    Types: Cigarettes  . Smokeless tobacco: Never Used     Comment: smoking less  . Alcohol Use: No     Comment: drinks 6-12 beer daily    Review of Systems  Constitutional: Negative for fever, chills, activity change, appetite change and fatigue.  HENT: Negative for congestion, facial swelling, rhinorrhea and trouble swallowing.   Eyes: Negative for photophobia and pain.  Respiratory: Negative for cough, chest tightness and shortness of breath.   Cardiovascular: Negative for chest pain and leg swelling.  Gastrointestinal: Positive for nausea, vomiting, abdominal pain and anorexia. Negative for diarrhea and constipation.  Endocrine: Negative for polydipsia and polyuria.  Genitourinary: Negative for dysuria, urgency, decreased urine volume and difficulty urinating.  Musculoskeletal: Negative for back pain and gait problem.  Skin: Negative for color change, rash and wound.  Allergic/Immunologic: Negative for immunocompromised state.  Neurological: Negative for dizziness, facial asymmetry, speech difficulty, weakness, numbness and headaches.  Psychiatric/Behavioral: Negative for confusion, decreased concentration and agitation.      Allergies  Other  Home Medications   Prior to Admission medications   Medication Sig Start Date End Date  Taking? Authorizing Provider  albuterol (PROVENTIL) (2.5 MG/3ML) 0.083% nebulizer solution Take 2.5 mg by nebulization as needed for wheezing or shortness of breath.   Yes Historical Provider, MD  aspirin 81 MG tablet Take 81 mg by mouth daily.     Yes Historical Provider, MD  B Complex Vitamins (VITAMIN-B COMPLEX PO) Take 800 mg by mouth daily.   Yes Historical Provider, MD  clopidogrel (PLAVIX) 75 MG tablet Take 75 mg by mouth daily with breakfast.   Yes Historical  Provider, MD  cyclobenzaprine (FLEXERIL) 10 MG tablet Take 1 tablet (10 mg total) by mouth 3 (three) times daily as needed for muscle spasms. 12/02/13  Yes Philmore Pali, NP  famotidine (PEPCID) 20 MG tablet Take 1 tablet (20 mg total) by mouth 2 (two) times daily as needed for heartburn. 11/26/12  Yes Jessee Avers, MD  folic acid (FOLVITE) 1 MG tablet Take 1 mg by mouth daily.     Yes Historical Provider, MD  GARLIC PO Take 1 tablet by mouth daily.   Yes Historical Provider, MD  hydrochlorothiazide (MICROZIDE) 12.5 MG capsule Take 12.5 mg by mouth daily.   Yes Historical Provider, MD  lubiprostone (AMITIZA) 24 MCG capsule Take 24 mcg by mouth as needed for constipation.   Yes Historical Provider, MD  Omega-3 Fatty Acids (FISH OIL PO) Take 1 capsule by mouth daily.   Yes Historical Provider, MD  Pancrelipase, Lip-Prot-Amyl, (ZENPEP) 25000 UNITS CPEP Take 1-2 capsules by mouth 3 (three) times daily with meals.    Yes Historical Provider, MD  simvastatin (ZOCOR) 20 MG tablet Take 20 mg by mouth daily.   Yes Historical Provider, MD  traMADol (ULTRAM) 50 MG tablet Take 50-100 mg by mouth every 6 (six) hours as needed (pain).    Yes Historical Provider, MD  traZODone (DESYREL) 150 MG tablet Take 1 tablet (150 mg total) by mouth at bedtime. 08/23/13  Yes Clinton Gallant, MD  ondansetron (ZOFRAN) 4 MG tablet Take 1 tablet (4 mg total) by mouth every 6 (six) hours. 02/16/14   Neta Ehlers, MD  oxyCODONE-acetaminophen (PERCOCET) 5-325 MG per tablet Take 1 tablet by mouth every 6 (six) hours as needed. 02/16/14   Neta Ehlers, MD   BP 127/90  Pulse 99  Temp(Src) 98.5 F (36.9 C) (Oral)  Resp 18  Ht 6' (1.829 m)  Wt 185 lb 8 oz (84.142 kg)  BMI 25.15 kg/m2  SpO2 94% Physical Exam  Constitutional: He is oriented to person, place, and time. He appears well-developed and well-nourished. No distress.  HENT:  Head: Normocephalic and atraumatic.  Mouth/Throat: No oropharyngeal exudate.  Eyes: Pupils are  equal, round, and reactive to light.  Neck: Normal range of motion. Neck supple.  Cardiovascular: Normal rate, regular rhythm and normal heart sounds.  Exam reveals no gallop and no friction rub.   No murmur heard. Pulmonary/Chest: Effort normal and breath sounds normal. No respiratory distress. He has no wheezes. He has no rales.  Abdominal: Soft. Bowel sounds are normal. He exhibits no distension and no mass. There is tenderness in the left upper quadrant. There is no rebound and no guarding.  Musculoskeletal: Normal range of motion. He exhibits no edema and no tenderness.  Neurological: He is alert and oriented to person, place, and time.  Skin: Skin is warm and dry.  Psychiatric: He has a normal mood and affect.    ED Course  Procedures (including critical care time) Labs Review Labs Reviewed  CBC WITH DIFFERENTIAL -  Abnormal; Notable for the following:    WBC 10.6 (*)    Hemoglobin 10.6 (*)    HCT 34.4 (*)    MCH 24.5 (*)    RDW 18.2 (*)    Platelets 655 (*)    Neutro Abs 7.8 (*)    All other components within normal limits  COMPREHENSIVE METABOLIC PANEL - Abnormal; Notable for the following:    Sodium 135 (*)    Chloride 95 (*)    Glucose, Bld 141 (*)    BUN 5 (*)    Albumin 3.0 (*)    Alkaline Phosphatase 141 (*)    All other components within normal limits  LIPASE, BLOOD - Abnormal; Notable for the following:    Lipase 73 (*)    All other components within normal limits  URINALYSIS, ROUTINE W REFLEX MICROSCOPIC - Abnormal; Notable for the following:    APPearance CLOUDY (*)    All other components within normal limits    Imaging Review Ct Abdomen Pelvis W Contrast  02/16/2014   CLINICAL DATA:  Left upper quadrant pain.  EXAM: CT ABDOMEN AND PELVIS WITH CONTRAST  TECHNIQUE: Multidetector CT imaging of the abdomen and pelvis was performed using the standard protocol following bolus administration of intravenous contrast.  CONTRAST:  145mL OMNIPAQUE IOHEXOL 300 MG/ML   SOLN  COMPARISON:  CT scan dated 01/23/2010  FINDINGS: There is a large left pleural effusion with compressive atelectasis in the lingula and left lower lobe.  The patient has acute on chronic pancreatitis with multiple pseudocysts in the left upper quadrant. There is no visible pancreatic necrosis. There are multiple calcifications in the pancreas consistent with chronic calcific pancreatitis. There is a rounded appearance of the pancreatic head but this is unchanged since the prior exam.  The patient has an enhancing pseudoaneurysm measuring 28 x 26 x 24 mm at the tip of the tail of the pancreas, adjacent to the inferior aspect of the spleen. This is associated with an adjacent complex pseudocyst measuring 51 x 28 mm. There is also a pseudo cyst in the head of the pancreas measuring 12 x 23 mm. There is a complex pseudocyst which extends from just under the diaphragm anterior to the fundus of the stomach distally along the left anterior lateral aspect of the abdomen measuring approximately 10.3 x 3.0 x 7.7 cm.  The pancreatic fluid dissects along the fascia planes between the muscles of the left lateral abdominal wall.  There is fluid in the lesser sac. There is a pseudocyst posterior to the upper pole of the spleen measuring 5.5 x 1.5 x 4.8 cm.  The patient also has a fluid loculated anterior to the right lobe of the liver as well as a small amount of free fluid in the pelvic cul-de-sac.  There is hepatic steatosis. Biliary tree is normal. Adrenal glands and kidneys are normal. The bowel appears normal including the terminal ileum and appendix.  There is a partially calcified chronic pseudocyst anterior to Gerota's fascia at the level of the mid left kidney measuring 3 cm maximum diameter.  No acute osseous abnormality.  IMPRESSION: 1. Acute on chronic pancreatitis with numerous new pseudocysts in the left upper quadrant. 2. 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 3. Large left pleural  effusion with compressive atelectasis in the lingula and left lower lobe.   Electronically Signed   By: Rozetta Nunnery M.D.   On: 02/16/2014 21:56     EKG Interpretation None  MDM   Final diagnoses:  Pancreatitis    Pt is a 48 y.o. male with Pmhx as above who presents with LUQ pain intermittentyl for about 1 month, worse past 2-3 days with anorexia. Pain worse w/ PO intake.  He denies fevers, d/a, urinary symptoms.  On PE, pt is mildly tachycardic, in but in NAD +TTP LUQ, w/o rebound or guarding. W/u pertinent for a lipase of 73, alk phose 141. Hb 10.6, WBC 10.6.  CT ab/pelvis with acute on chronic panreatitis w/ LUQ pseudocysts, pseudoaneurysm at tip of panreas and L pleural effusion. Pt given 2 doses of dilaudid and was able to tolerate PO. He would like to go home. Strict Return precautions given for new or worsening symptoms including worsening pain, fever, inability to tolerate PO were given ot both pt & wife. They agree. They will continue supportive care at home w/ liquids diet AAT, PO percocet & zofran. Pt will call his PCP tomorrow to discuss ED visit about outpt follow up.          Neta Ehlers, MD 02/17/14 [phone]

## 2014-02-16 NOTE — ED Notes (Signed)
Patient reports he has taken hydrocodene and tramadol at home for pain management.

## 2014-06-09 ENCOUNTER — Encounter (INDEPENDENT_AMBULATORY_CARE_PROVIDER_SITE_OTHER): Payer: Self-pay

## 2014-06-09 ENCOUNTER — Ambulatory Visit (INDEPENDENT_AMBULATORY_CARE_PROVIDER_SITE_OTHER): Payer: Medicaid Other | Admitting: Nurse Practitioner

## 2014-06-09 ENCOUNTER — Encounter: Payer: Self-pay | Admitting: Nurse Practitioner

## 2014-06-09 VITALS — BP 133/78 | HR 88 | Ht 73.0 in | Wt 188.8 lb

## 2014-06-09 DIAGNOSIS — F17200 Nicotine dependence, unspecified, uncomplicated: Secondary | ICD-10-CM

## 2014-06-09 DIAGNOSIS — R51 Headache: Secondary | ICD-10-CM

## 2014-06-09 DIAGNOSIS — R519 Headache, unspecified: Secondary | ICD-10-CM

## 2014-06-09 DIAGNOSIS — I63239 Cerebral infarction due to unspecified occlusion or stenosis of unspecified carotid arteries: Secondary | ICD-10-CM

## 2014-06-09 MED ORDER — TOPIRAMATE 25 MG PO TABS: 25.0000 mg | ORAL_TABLET | Freq: Two times a day (BID) | ORAL | Status: DC

## 2014-06-09 NOTE — Patient Instructions (Addendum)
Continue Flexeril 10 mg as needed for muscle spasm, neck pain. May take every 8 hours as needed.   Start Topamax 25 mg daily and after 2 weeks increase to 50 mg each night for headache prevention. May also cut down on cravings to smoke.  Continue aspirin and Plavix for secondary stroke prevention with strict control of hypertension with blood pressure goal below 130/90. Hyperlipidemia with LDL cholesterol goal below 100 mg percent. I have advised the patient to quit smoking completely and counseled him to do so.    Follow up in 2 months, sooner as needed.   Stroke Prevention Some medical conditions and behaviors are associated with an increased chance of having a stroke. You may prevent a stroke by making healthy choices and managing medical conditions. HOW CAN I REDUCE MY RISK OF HAVING A STROKE?   Stay physically active. Get at least 30 minutes of activity on most or all days.  Do not smoke. It may also be helpful to avoid exposure to secondhand smoke.  Limit alcohol use. Moderate alcohol use is considered to be:  No more than 2 drinks per day for men.  No more than 1 drink per day for nonpregnant women.  Eat healthy foods. This involves:  Eating 5 or more servings of fruits and vegetables a day.  Making dietary changes that address high blood pressure (hypertension), high cholesterol, diabetes, or obesity.  Manage your cholesterol levels.  Making food choices that are high in fiber and low in saturated fat, trans fat, and cholesterol may control cholesterol levels.  Take any prescribed medicines to control cholesterol as directed by your health care provider.  Manage your diabetes.  Controlling your carbohydrate and sugar intake is recommended to manage diabetes.  Take any prescribed medicines to control diabetes as directed by your health care provider.  Control your hypertension.  Making food choices that are low in salt (sodium), saturated fat, trans fat, and  cholesterol is recommended to manage hypertension.  Take any prescribed medicines to control hypertension as directed by your health care provider.  Maintain a healthy weight.  Reducing calorie intake and making food choices that are low in sodium, saturated fat, trans fat, and cholesterol are recommended to manage weight.  Stop drug abuse.  Avoid taking birth control pills.  Talk to your health care provider about the risks of taking birth control pills if you are over 27 years old, smoke, get migraines, or have ever had a blood clot.  Get evaluated for sleep disorders (sleep apnea).  Talk to your health care provider about getting a sleep evaluation if you snore a lot or have excessive sleepiness.  Take medicines only as directed by your health care provider.  For some people, aspirin or blood thinners (anticoagulants) are helpful in reducing the risk of forming abnormal blood clots that can lead to stroke. If you have the irregular heart rhythm of atrial fibrillation, you should be on a blood thinner unless there is a good reason you cannot take them.  Understand all your medicine instructions.  Make sure that other conditions (such as anemia or atherosclerosis) are addressed. SEEK IMMEDIATE MEDICAL CARE IF:   You have sudden weakness or numbness of the face, arm, or leg, especially on one side of the body.  Your face or eyelid droops to one side.  You have sudden confusion.  You have trouble speaking (aphasia) or understanding.  You have sudden trouble seeing in one or both eyes.  You have sudden trouble  walking.  You have dizziness.  You have a loss of balance or coordination.  You have a sudden, severe headache with no known cause.  You have new chest pain or an irregular heartbeat. Any of these symptoms may represent a serious problem that is an emergency. Do not wait to see if the symptoms will go away. Get medical help at once. Call your local emergency services  (911 in [place] Do not drive yourself to the hospital. Document Released: 10/17/2004 Document Revised: 01/24/2014 Document Reviewed: 03/12/2013 [facility] Patient Information 2015 [place], [place]. This information is not intended to replace advice given to you by your health care provider. Make sure you discuss any questions you have with your health care provider.

## 2014-06-09 NOTE — Progress Notes (Signed)
PATIENT: Brandon [name] DOB: [birth year]  REASON FOR VISIT: routine follow up for stroke HISTORY FROM: patient  HISTORY OF PRESENT ILLNESS: 09/24/12 (PS): Brandon [name] is a 48 year old Caucasian male seen for followup after hospital admission for stroke on 06/10/2012. He presented with sudden onset of left facial droop and slurred speech and left arm and left numbness. He was given IV TPA without complications. He has showed significant improvement except for persistent left-sided numbness. MRI scan of the brain showed only small right parietal and insular cortex infarct. Carotid ultrasound showed heavily calcified right internal carotid artery with occlusion. Left side showed moderate calcification without significant stenosis. CT angiogram showed near occlusion of the right proximal ICA with trickle flow beyond. 2-D echo showed unusual wall motion abnormalities with hypokinesis of the anterior septum. Ejection fraction of 55%. Nuclear cardiac scan showed minimum hypokinesia involving the inferior wall and septum with 51% ejection fraction and no cardiac source of embolism. Patient had cerebral catheter angiogram on 06/15/2012 by Dr. 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 Patient was found to have hyperlipidemia, hypertension and smoking as vascular factors. He was started on aspirin and Plavix and states he has done well. His speech difficulties have improved and facial droop as well. He still has some mild paresthesias in the left hand. 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 He has applied for Medicaid and application is pending. He has not been doing outpatient physical and occupational therapy as he has no  insurance. He has cut back smoking but not completely quit yet. His blood pressure today is 132/96.  10/20/13 (LL): Patient returns for 1 year follow up. Since last visit 1 year ago he has had a moderate recovery but was not able to return to work due to easy fatigueability and cognitive difficulties. Repeat CT angiogram Head and Neck showed no change in 50 % stenosis of the left ICA in the bulb region with occlusion of the right vertebral artery. Complete occlusion of the right ICA at the bulb. Reconstituted flow in the right ICA at the skull base. Flow in both distal vertebral arteries with the right being quite small. No basilar stenosis. No posterior circulation branch vessel stenosis or occlusion. He continues to smoke and reports that he has tried many times to quit. He tried Chantix but due to mood changes and agitation had to stop. He complains of near every day headaches that are incapacitating. They are described as "all-over", not unilateral, pounding in quality with sharp shooting pains coming up from the occipital area. He has tried tramadol for these headaches, with some relief but he takes them more frequently than ordered which has led to stomach irritation.  12/02/13 (LL): Patient returns for follow up. His MRI brain shows Mild diffuse and mild-moderate perisylvian atrophy, moderate ventriculomegaly. MRI cervical spine revealed mild stenosis and disc bulging at several levels. His carotid doppler study was unchanged from previous. Since last visit his headaches have continued, essentially unchanged. His wife states he has mood swings, is easily agitated. He is still smoking. He has not gotten any relief from headaches from Tizanidine, it only makes him tired. He has used a family member's Flexeril, which worked  better, and helped him rest.   Update 06/09/14 (LL): Brandon [name] returns to the office for stroke followup. Since last visit, there has not been much change per patient and family member.  He continues to have headaches and neck pain.  He states his PCP Lacie Scotts can no longer prescribe narcotics and he was referred to a pain clinic and put on Suboxone. He continues to smoke and drink alcohol despite having acute on chronic pancreatitis with multiple pseudocysts, with a flareup in May.  He does not sleep well.  He is requesting Percocet today. Blood pressure is well controlled, it is 133/78 in the office today. He is tolerating Plavix well with no signs of significant bleeding or bruising. He was not able to tolerate Depakote ER prescribed at last visit, stating it made him feel "like I was someone else," and quit taking it after 3 days.  REVIEW OF SYSTEMS: Full 14 system review of systems performed and notable only for:  fatigue, neck pain, ringing in ears, runny nose, double vision, shortness of breath, chest tightness, chest pain, abdominal pain, black stools, insomnia, daytime sleepiness, sleep talking, back pain, walking difficulty,  memory loss, dizziness, headache, tremors, weakness, agitation, confusion, depression, anxiety, hyperactive, behavior problem.    ALLERGIES: Allergies  Allergen Reactions  . Other     Medication related to ETOH detox. ??    HOME MEDICATIONS: Outpatient Prescriptions Prior to Visit  Medication Sig Dispense Refill  . albuterol (PROVENTIL) (2.5 MG/3ML) 0.083% nebulizer solution Take 2.5 mg by nebulization as needed for wheezing or shortness of breath.      Marland Kitchen aspirin 81 MG tablet Take 81 mg by mouth daily.        . B Complex Vitamins (VITAMIN-B COMPLEX PO) Take 800 mg by mouth daily.      . clopidogrel (PLAVIX) 75 MG tablet Take 75 mg by mouth daily with breakfast.      . cyclobenzaprine (FLEXERIL) 10 MG tablet Take 1 tablet (10 mg total) by mouth 3 (three) times daily as needed for muscle spasms.  90 tablet  5  . famotidine (PEPCID) 20 MG tablet Take 1 tablet (20 mg total) by mouth 2 (two) times daily as needed for heartburn.  180 tablet  6  .  folic acid (FOLVITE) 1 MG tablet Take 1 mg by mouth daily.        Marland Kitchen GARLIC PO Take 1 tablet by mouth daily.      . hydrochlorothiazide (MICROZIDE) 12.5 MG capsule Take 12.5 mg by mouth daily.      Marland Kitchen lubiprostone (AMITIZA) 24 MCG capsule Take 24 mcg by mouth as needed for constipation.      . Omega-3 Fatty Acids (FISH OIL PO) Take 1 capsule by mouth daily.      . ondansetron (ZOFRAN) 4 MG tablet Take 1 tablet (4 mg total) by mouth every 6 (six) hours.  15 tablet  0  . oxyCODONE-acetaminophen (PERCOCET) 5-325 MG per tablet Take 1 tablet by mouth every 6 (six) hours as needed.  15 tablet  0  . Pancrelipase, Lip-Prot-Amyl, (ZENPEP) 25000 UNITS CPEP Take 1-2 capsules by mouth 3 (three) times daily with meals.       . simvastatin (ZOCOR) 20 MG tablet Take 20 mg by mouth daily.      . traMADol (ULTRAM) 50 MG tablet Take 50-100 mg by mouth every 6 (six) hours as needed (pain).       . traZODone (DESYREL) 150 MG tablet Take 1 tablet (150  mg total) by mouth at bedtime.  30 tablet  1   No facility-administered medications prior to visit.   Meds ordered this encounter  Medications  . tiotropium (SPIRIVA HANDIHALER) 18 MCG inhalation capsule    Sig: Place into inhaler and inhale.  . Buprenorphine HCl-Naloxone HCl (SUBOXONE) 4-1 MG FILM    Sig: Place under the tongue as needed.    PHYSICAL EXAM Filed Vitals:   06/09/14 0907  BP: 133/78  Pulse: 88  Height:  (1.854 m)  Weight: 188 lb 12.8 oz (85.639 kg)   Body mass index is 24.91 kg/(m^2).  Generalized: Well developed, in no acute distress, ANXIOUS, AVOIDS EYE CONTACT  Head: normocephalic and atraumatic. Oropharynx benign  Neck: Supple, no carotid bruits  Cardiac: Regular rate rhythm, no murmur  Musculoskeletal: No deformity   Neurological examination  Mentation: Alert oriented to time, place, history taking. Follows all commands speech and language fluent  Cranial nerve II-XII: Pupils were equal round reactive to light extraocular  movements were full, visual field were full on confrontational test. Facial sensation and strength were normal. hearing was intact to finger rubbing bilaterally. Uvula tongue midline. head turning and shoulder shrug and were normal and symmetric.Tongue protrusion into cheek strength was normal.  Motor: normal bulk and tone, full strength in the BUE, BLE, no pronator drift. Diminished fine finger movements on the left. Minimum left grip weakness. Orbits right over left approximately.  Sensory: normal and symmetric to light touch, pinprick, and vibration, except slight decrease in left upper extremity.  Coordination: finger-nose-finger, heel-to-shin bilaterally, no dysmetria  Gait and Station: Rising up from seated position without assistance, normal stance, without trunk ataxia, moderate stride, good arm swing, smooth turning, able to perform tiptoe, heel walking without difficulty. Tandem unsteady.  Reflexes: Deep tendon reflexes in the upper and lower extremities are present and symmetric.   DIAGNOSTIC DATA (LABS, IMAGING, TESTING) - I reviewed patient records, labs, notes, testing and imaging myself where available.  - I reviewed patient records, labs, notes, testing and imaging myself where available.  11/04/13 Abnormal MRI cervical spine (without) demonstrating:  1. At C3-4. C4-5, C5-6: disc bulging and uncovertebral joint hypertrophy with mild spinal stenosis and severe biforaminal foraminal stenosis; no cord signal abnormalities  2. At T2-3: disc bulging with mild right and moderate left foraminal stenosis   Abnormal MRI brain (without) demonstrating:  1. Mild diffuse and mild-moderate perisylvian atrophy. Moderate ventriculomegaly.  2. Mild chronic small vessel ischemic disease.  3. No acute findings.  4. No new findings compared to MRI on 06/11/12.   ASSESSMENT: Brandon [name] is a 48 year old Caucasian male with a right insular and frontal cortex infarct secondary to embolization from  proximal right internal carotid artery dissection with occlusion in the neck with moderate left proximal internal carotid artery focal dissection as well as chronically occluded right vertebral artery. Vascular risk factors of hypertension, cardiac disease, hyperlipidemia and smoking. 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   PLAN: I had a long discussion with the patient and family regarding his stroke, discussed stroke prevention and answered questions.  Continue Flexeril 10 mg as needed for muscle spasm, neck pain. May take every 8 hours as needed.  He is advised to continue pain management at his pain clinic, we will not prescribe narcotics for him. Start Topamax 25 mg daily and after 2 weeks increase to 50 mg each night for headache prevention. May also cut down on cravings to smoke. Continue aspirin and Plavix for secondary stroke  prevention with strict control of hypertension with blood pressure goal below 130/90. Hyperlipidemia with LDL cholesterol goal below 100 mg percent. I have advised the patient to quit smoking completely and counseled him to do so.   Return in about 2 months (around 08/09/2014).  Tawny Asal LAM, MSN, FNP-BC, A/GNP-C 06/12/2014, 12:32 PM [facility] [address], [address] [place], [place] [address] [phone]  Note: This document was prepared with digital dictation and possible smart phrase technology. Any transcriptional errors that result from this process are unintentional.

## 2014-06-13 NOTE — Progress Notes (Signed)
I agree with the above plan 

## 2014-08-04 ENCOUNTER — Encounter: Payer: Self-pay | Admitting: Nurse Practitioner

## 2014-08-04 ENCOUNTER — Ambulatory Visit (INDEPENDENT_AMBULATORY_CARE_PROVIDER_SITE_OTHER): Payer: Medicaid Other | Admitting: Nurse Practitioner

## 2014-08-04 VITALS — BP 129/85 | HR 83 | Ht 73.0 in | Wt 189.3 lb

## 2014-08-04 DIAGNOSIS — I6523 Occlusion and stenosis of bilateral carotid arteries: Secondary | ICD-10-CM

## 2014-08-04 DIAGNOSIS — G4486 Cervicogenic headache: Secondary | ICD-10-CM

## 2014-08-04 DIAGNOSIS — F17200 Nicotine dependence, unspecified, uncomplicated: Secondary | ICD-10-CM

## 2014-08-04 DIAGNOSIS — Z720 Tobacco use: Secondary | ICD-10-CM

## 2014-08-04 DIAGNOSIS — R51 Headache: Secondary | ICD-10-CM

## 2014-08-04 DIAGNOSIS — M542 Cervicalgia: Secondary | ICD-10-CM

## 2014-08-04 MED ORDER — BUPROPION HCL ER (SR) 150 MG PO TB12: ORAL_TABLET | ORAL | Status: DC

## 2014-08-04 MED ORDER — TRAMADOL HCL 50 MG PO TABS: 50.0000 mg | ORAL_TABLET | Freq: Four times a day (QID) | ORAL | Status: DC | PRN

## 2014-08-04 MED ORDER — CYCLOBENZAPRINE HCL 10 MG PO TABS: 10.0000 mg | ORAL_TABLET | Freq: Three times a day (TID) | ORAL | Status: DC | PRN

## 2014-08-04 NOTE — Patient Instructions (Addendum)
Continue Flexeril 10 mg as needed for muscle spasm, neck pain. May take every 8 hours as needed.    Refilled Tramadol for headache pain.  Start Bupropion (Wellbutrin) 1 tablet each morning for 3 days, then start 1 tablet twice daily.  This medication treats depression, and smoking addiction, and tends to be energizing.  There is small risk for seizure in people that have seizure disorder. You will need to have your lever enzymes checked regularly.  Continue aspirin and Plavix for secondary stroke prevention with strict control of hypertension with blood pressure goal below 130/90. Hyperlipidemia with LDL cholesterol goal below 100 mg percent. I have advised the patient to quit smoking completely.   Follow up in 6 months with Dr. Pearlean BrownieSethi, sooner as needed.

## 2014-08-04 NOTE — Progress Notes (Signed)
PATIENT: Brandon [name] DOB: [birth year]  REASON FOR VISIT: routine follow up for stroke, headache, neck pain  HISTORY FROM: patient, S.O.  HISTORY OF PRESENT ILLNESS: 09/24/12 (PS): Mr. Kaupp is a 48 year old Caucasian male seen for followup after hospital admission for stroke on 06/10/2012. He presented with sudden onset of left facial droop and slurred speech and left arm and left numbness. He was given IV TPA without complications. He has showed significant improvement except for persistent left-sided numbness. MRI scan of the brain showed only small right parietal and insular cortex infarct. Carotid ultrasound showed heavily calcified right internal carotid artery with occlusion. Left side showed moderate calcification without significant stenosis. CT angiogram showed near occlusion of the right proximal ICA with trickle flow beyond. 2-D echo showed unusual wall motion abnormalities with hypokinesis of the anterior septum. Ejection fraction of 55%. Nuclear cardiac scan showed minimum hypokinesia involving the inferior wall and septum with 51% ejection fraction and no cardiac source of embolism. Patient had cerebral catheter angiogram on 06/15/2012 by Dr. 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 Patient was found to have hyperlipidemia, hypertension and smoking as vascular factors. He was started on aspirin and Plavix and states he has done well. His speech difficulties have improved and facial droop as well. He still has some mild paresthesias in the left hand. 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 He has applied for Medicaid and application is pending. He has not been doing outpatient physical and  occupational therapy as he has no insurance. He has cut back smoking but not completely quit yet. His blood pressure today is 132/96.   10/20/13 (LL): Patient returns for 1 year follow up. Since last visit 1 year ago he has had a moderate recovery but was not able to return to work due to easy fatigueability and cognitive difficulties. Repeat CT angiogram Head and Neck showed no change in 50 % stenosis of the left ICA in the bulb region with occlusion of the right vertebral artery. Complete occlusion of the right ICA at the bulb. Reconstituted flow in the right ICA at the skull base. Flow in both distal vertebral arteries with the right being quite small. No basilar stenosis. No posterior circulation branch vessel stenosis or occlusion. He continues to smoke and reports that he has tried many times to quit. He tried Chantix but due to mood changes and agitation had to stop. He complains of near every day headaches that are incapacitating. They are described as "all-over", not unilateral, pounding in quality with sharp shooting pains coming up from the occipital area. He has tried tramadol for these headaches, with some relief but he takes them more frequently than ordered which has led to stomach irritation.   12/02/13 (LL): Patient returns for follow up. His MRI brain shows Mild diffuse and mild-moderate perisylvian atrophy, moderate ventriculomegaly. MRI cervical spine revealed mild stenosis and disc bulging at several levels. His carotid doppler study was unchanged from previous. Since last visit his headaches have continued, essentially unchanged. His wife states he has mood swings, is easily agitated. He is still smoking. He has not gotten any relief from headaches from Tizanidine, it only makes him tired. He has  used a family member's Flexeril, which worked better, and helped him rest.  Update 06/09/14 (LL): [name] returns to the office for stroke followup. Since last visit, there has not been much  change per patient and friend. He continues to have headaches and neck pain.  He states his PCP Lacie Scottsiemeyer can no longer prescribe narcotics and he was referred to a pain clinic and put on Suboxone. He continues to smoke and drink alcohol despite having acute on chronic pancreatitis with multiple pseudocysts, with a flareup in May.  He does not sleep well.  He is requesting Percocet today. Blood pressure is well controlled, it is 133/78 in the office today. He is tolerating Plavix well with no signs of significant bleeding or bruising. He was not able to tolerate Depakote ER prescribed at last visit, stating it made him feel "like I was someone else," and quit taking it after 3 days. Update 08/04/14 (LL): He returns for 3 month follow up. He states there is not been change in his headaches or neck pain. He tried Topamax for headache prevention after last visit but they're in the week he took the lowest dose his friend states he slept all the time. He discontinued the medication. Flexeril seems to help his neck spasms more than tizanidine did. Blood pressure is well controlled, it is 129/85 in the office today. He is tolerating Plavix well with no signs of significant bleeding or bruising. He continues to smoke daily. His girlfriend states that he has nowhere near the stamina or initiative that he had before the stroke. With any amount of physical activity seems to tire quickly. He continues to be depressed, not sleeping well at night, he cares for his father who has terminal disease.  REVIEW OF SYSTEMS: Full 14 system review of systems performed and notable only for:   fatigue, neck pain, ringing in ears, runny nose, double vision, shortness of breath, chest tightness, chest pain, abdominal pain, black stools, insomnia, daytime sleepiness, sleep talking, back pain, walking difficulty,  memory loss, dizziness, headache, tremors, weakness, agitation, confusion, depression, anxiety, hyperactive, behavior problem.     ALLERGIES: Allergies  Allergen Reactions  . Other     Medication related to ETOH detox. ??    HOME MEDICATIONS: Outpatient Prescriptions Prior to Visit  Medication Sig Dispense Refill  . albuterol (PROVENTIL) (2.5 MG/3ML) 0.083% nebulizer solution Take 2.5 mg by nebulization as needed for wheezing or shortness of breath.    Marland Kitchen. aspirin 81 MG tablet Take 81 mg by mouth daily.      . B Complex Vitamins (VITAMIN-B COMPLEX PO) Take 800 mg by mouth daily.    . clopidogrel (PLAVIX) 75 MG tablet Take 75 mg by mouth daily with breakfast.    . famotidine (PEPCID) 20 MG tablet Take 1 tablet (20 mg total) by mouth 2 (two) times daily as needed for heartburn. 180 tablet 6  . folic acid (FOLVITE) 1 MG tablet Take 1 mg by mouth daily.      . hydrochlorothiazide (MICROZIDE) 12.5 MG capsule Take 12.5 mg by mouth daily.    Marland Kitchen. lubiprostone (AMITIZA) 24 MCG capsule Take 24 mcg by mouth as needed for constipation.    . Omega-3 Fatty Acids (FISH OIL PO) Take 1 capsule by mouth daily.    . ondansetron (ZOFRAN) 4 MG tablet Take 1 tablet (4 mg total) by mouth every 6 (six) hours. 15 tablet 0  . Pancrelipase, Lip-Prot-Amyl, (ZENPEP) 25000 UNITS CPEP Take 1-2 capsules by mouth 3 (three)  times daily with meals.     . simvastatin (ZOCOR) 20 MG tablet Take 20 mg by mouth daily.    Marland Kitchen. tiotropium (SPIRIVA HANDIHALER) 18 MCG inhalation capsule Place into inhaler and inhale.    . traZODone (DESYREL) 150 MG tablet Take 1 tablet (150 mg total) by mouth at bedtime. 30 tablet 1  . cyclobenzaprine (FLEXERIL) 10 MG tablet Take 1 tablet (10 mg total) by mouth 3 (three) times daily as needed for muscle spasms. 90 tablet 5  . topiramate (TOPAMAX) 25 MG tablet Take 1 tablet (25 mg total) by mouth 2 (two) times daily. 60 tablet 5  . traMADol (ULTRAM) 50 MG tablet Take 50-100 mg by mouth every 6 (six) hours as needed (pain).     . Buprenorphine HCl-Naloxone HCl (SUBOXONE) 4-1 MG FILM Place under the tongue as needed.    Marland Kitchen. GARLIC  PO Take 1 tablet by mouth daily.    Marland Kitchen. oxyCODONE-acetaminophen (PERCOCET) 5-325 MG per tablet Take 1 tablet by mouth every 6 (six) hours as needed. 15 tablet 0   No facility-administered medications prior to visit.    PHYSICAL EXAM Filed Vitals:   08/04/14 0853  BP: 129/85  Pulse: 83  Height: 6\' 1"  (1.854 m)  Weight: 189 lb 5 oz (85.872 kg)   Body mass index is 24.98 kg/(m^2).  Generalized: Well developed, in no acute distress, ANXIOUS, AVOIDS EYE CONTACT   Head: normocephalic and atraumatic. Oropharynx benign   Neck: Supple, no carotid bruits   Cardiac: Regular rate rhythm, no murmur   Musculoskeletal: No deformity   Neurological examination   Mentation: Alert oriented to time, place, history taking. Follows all commands speech and language fluent   Cranial nerve II-XII: Pupils were equal round reactive to light extraocular movements were full, visual field were full on confrontational test. Facial sensation and strength were normal. hearing was intact to finger rubbing bilaterally. Uvula tongue midline. head turning and shoulder shrug and were normal and symmetric.Tongue protrusion into cheek strength was normal.   Motor: normal bulk and tone, full strength in the BUE, BLE, no pronator drift. Diminished fine finger movements on the left. Minimum left grip weakness. Orbits right over left approximately.   Sensory: normal and symmetric to light touch, pinprick, and vibration, except slight decrease in left upper extremity.   Coordination: finger-nose-finger, heel-to-shin bilaterally, no dysmetria   Gait and Station: Rising up from seated position without assistance, normal stance, without trunk ataxia, moderate stride, good arm swing, smooth turning, able to perform tiptoe, heel walking without difficulty. Tandem unsteady.   Reflexes: Deep tendon reflexes in the upper and lower extremities are present and symmetric.   DIAGNOSTIC DATA (LABS, IMAGING, TESTING) - I reviewed patient  records, labs, notes, testing and imaging myself where available.  - I reviewed patient records, labs, notes, testing and imaging myself where available.   11/04/13 Abnormal MRI cervical spine (without) demonstrating:   1. At C3-4. C4-5, C5-6: disc bulging and uncovertebral joint hypertrophy with mild spinal stenosis and severe biforaminal foraminal stenosis; no cord signal abnormalities   2. At T2-3: disc bulging with mild right and moderate left foraminal stenosis   Abnormal MRI brain (without) demonstrating:   1. Mild diffuse and mild-moderate perisylvian atrophy. Moderate ventriculomegaly.   2. Mild chronic small vessel ischemic disease.   3. No acute findings.   4. No new findings compared to MRI on 06/11/12.   ASSESSMENT: [name] is a 48 year old Caucasian male with a right insular and frontal cortex infarct  secondary to embolization from proximal right internal carotid artery dissection with occlusion in the neck with moderate left proximal internal carotid artery focal dissection as well as chronically occluded right vertebral artery. Vascular risk factors of hypertension, cardiac disease, hyperlipidemia and smoking. 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   PLAN: I had a long discussion with the patient and family regarding his stroke, discussed stroke prevention and answered questions.  Continue Flexeril 10 mg as needed for muscle spasm, neck pain. May take every 8 hours as needed.   He is advised to continue pain management at his pain clinic, we will not prescribe narcotics for him. Start Wellbutrin 150 SR, 1 tablet each am x 3 days then increase to 1 tab every 12 hours for smoking cessation and depression. Continue aspirin and Plavix for secondary stroke prevention with strict control of hypertension with blood pressure goal below 130/90. Hyperlipidemia with LDL cholesterol goal below 100 mg percent. I have advised the patient to quit smoking completely and counseled  him to do so.  Meds ordered this encounter  Medications  . buPROPion (WELLBUTRIN SR) 150 MG 12 hr tablet    Sig: Start 1 in the am for 3 days then 1 tab twice daily.    Dispense:  60 tablet    Refill:  5    Order Specific Question:  Supervising Provider    Answer:  Pearlean Brownie, PRAMOD [[id number]]  . cyclobenzaprine (FLEXERIL) 10 MG tablet    Sig: Take 1 tablet (10 mg total) by mouth 3 (three) times daily as needed for muscle spasms.    Dispense:  90 tablet    Refill:  5    Order Specific Question:  Supervising Provider    Answer:  Pearlean Brownie, PRAMOD [[id number]]  . traMADol (ULTRAM) 50 MG tablet    Sig: Take 1-2 tablets (50-100 mg total) by mouth every 6 (six) hours as needed for moderate pain (pain).    Dispense:  90 tablet    Refill:  5    Order Specific Question:  Supervising Provider    Answer:  Delia Heady [[id number]]   Return in about 6 months (around 02/02/2015) for neck pain, stroke, headaches.  Tawny Asal Jessi Pitstick, MSN, FNP-BC, A/GNP-C 08/06/2014, 11:40 AM [facility] [address] [place], [place] [address] [phone]  Note: This document was prepared with digital dictation and possible smart phrase technology. Any transcriptional errors that result from this process are unintentional.

## 2014-08-06 ENCOUNTER — Encounter: Payer: Self-pay | Admitting: Nurse Practitioner

## 2014-08-08 NOTE — Progress Notes (Signed)
I agree with the above plan 

## 2014-09-01 ENCOUNTER — Encounter (HOSPITAL_COMMUNITY): Payer: Self-pay | Admitting: Vascular Surgery

## 2014-09-23 DIAGNOSIS — I779 Disorder of arteries and arterioles, unspecified: Secondary | ICD-10-CM

## 2014-09-23 HISTORY — DX: Disorder of arteries and arterioles, unspecified: I77.9

## 2014-10-10 ENCOUNTER — Other Ambulatory Visit: Payer: Self-pay | Admitting: Nurse Practitioner

## 2014-10-11 NOTE — Telephone Encounter (Signed)
Rx signed and faxed.

## 2015-02-02 ENCOUNTER — Ambulatory Visit: Payer: Medicaid Other | Admitting: Neurology

## 2015-02-22 DIAGNOSIS — I519 Heart disease, unspecified: Secondary | ICD-10-CM

## 2015-02-22 DIAGNOSIS — I701 Atherosclerosis of renal artery: Secondary | ICD-10-CM

## 2015-02-22 HISTORY — DX: Heart disease, unspecified: I51.9

## 2015-02-22 HISTORY — DX: Atherosclerosis of renal artery: I70.1

## 2015-03-15 ENCOUNTER — Encounter
Admission: RE | Disposition: A | Discharge status: Home / Self Care | Payer: Self-pay | Source: Ambulatory Visit | Attending: Cardiovascular Disease

## 2015-03-15 ENCOUNTER — Inpatient Hospital Stay
Admission: RE | Admit: 2015-03-15 | Discharge: 2015-03-17 | DRG: 247 | Disposition: A | Discharge status: Home / Self Care | Payer: Medicaid Other | Source: Ambulatory Visit | Attending: Cardiovascular Disease | Admitting: Cardiovascular Disease

## 2015-03-15 ENCOUNTER — Encounter: Payer: Self-pay | Admitting: *Deleted

## 2015-03-15 ENCOUNTER — Ambulatory Visit: Admission: AD | Admit: 2015-03-15 | Payer: Self-pay | Admitting: Internal Medicine

## 2015-03-15 DIAGNOSIS — E785 Hyperlipidemia, unspecified: Secondary | ICD-10-CM | POA: Diagnosis present

## 2015-03-15 DIAGNOSIS — I739 Peripheral vascular disease, unspecified: Secondary | ICD-10-CM | POA: Diagnosis present

## 2015-03-15 DIAGNOSIS — I2510 Atherosclerotic heart disease of native coronary artery without angina pectoris: Secondary | ICD-10-CM | POA: Diagnosis present

## 2015-03-15 DIAGNOSIS — F17210 Nicotine dependence, cigarettes, uncomplicated: Secondary | ICD-10-CM | POA: Diagnosis present

## 2015-03-15 DIAGNOSIS — I200 Unstable angina: Secondary | ICD-10-CM | POA: Diagnosis present

## 2015-03-15 DIAGNOSIS — I10 Essential (primary) hypertension: Secondary | ICD-10-CM | POA: Diagnosis present

## 2015-03-15 DIAGNOSIS — Z8673 Personal history of transient ischemic attack (TIA), and cerebral infarction without residual deficits: Secondary | ICD-10-CM

## 2015-03-15 DIAGNOSIS — I25110 Atherosclerotic heart disease of native coronary artery with unstable angina pectoris: Principal | ICD-10-CM | POA: Diagnosis present

## 2015-03-15 HISTORY — DX: Peripheral vascular disease, unspecified: I73.9

## 2015-03-15 HISTORY — PX: CARDIAC CATHETERIZATION: SHX172

## 2015-03-15 SURGERY — CORONARY STENT INTERVENTION
Anesthesia: Moderate Sedation

## 2015-03-15 SURGERY — LEFT HEART CATH
Anesthesia: Moderate Sedation

## 2015-03-15 MED ORDER — CLOPIDOGREL BISULFATE 75 MG PO TABS: 75.0000 mg | ORAL_TABLET | Freq: Every day | ORAL | Status: DC

## 2015-03-15 MED ORDER — SODIUM CHLORIDE 0.9 % IV SOLN
250.0000 mL | INTRAVENOUS | Status: DC | PRN
  Administered 2015-03-15: 1000 mL via INTRAVENOUS

## 2015-03-15 MED ORDER — DIAZEPAM 2 MG PO TABS
2.0000 mg | ORAL_TABLET | ORAL | Status: DC | PRN
  Administered 2015-03-16 – 2015-03-17 (×3): 2 mg via ORAL
  Filled 2015-03-15 (×3): qty 1

## 2015-03-15 MED ORDER — ACETAMINOPHEN 325 MG PO TABS: 650.0000 mg | ORAL_TABLET | ORAL | Status: DC | PRN

## 2015-03-15 MED ORDER — SODIUM CHLORIDE 0.9 % WEIGHT BASED INFUSION: 3.0000 mL/kg/h | INTRAVENOUS | Status: DC

## 2015-03-15 MED ORDER — MIDAZOLAM HCL 2 MG/2ML IJ SOLN
INTRAMUSCULAR | Status: AC
  Filled 2015-03-15: qty 2

## 2015-03-15 MED ORDER — CLOPIDOGREL BISULFATE 75 MG PO TABS
ORAL_TABLET | ORAL | Status: AC
  Filled 2015-03-15: qty 4

## 2015-03-15 MED ORDER — SODIUM CHLORIDE 0.9 % IV SOLN: 250.0000 mL | INTRAVENOUS | Status: DC | PRN

## 2015-03-15 MED ORDER — SODIUM CHLORIDE 0.9 % IV SOLN
INTRAVENOUS | Status: DC
  Administered 2015-03-15 – 2015-03-17 (×3): via INTRAVENOUS

## 2015-03-15 MED ORDER — IOHEXOL 300 MG/ML  SOLN
INTRAMUSCULAR | Status: DC | PRN
Start: 2015-03-15 — End: 2015-03-15
  Administered 2015-03-15: 100 mL via INTRAVENOUS
  Administered 2015-03-15: 30 mL via INTRAVENOUS

## 2015-03-15 MED ORDER — ASPIRIN 81 MG PO CHEW: 324.0000 mg | CHEWABLE_TABLET | ORAL | Status: DC

## 2015-03-15 MED ORDER — SODIUM CHLORIDE 0.9 % IJ SOLN
3.0000 mL | INTRAMUSCULAR | Status: DC | PRN
  Administered 2015-03-15: 3 mL via INTRAVENOUS
  Filled 2015-03-15: qty 10

## 2015-03-15 MED ORDER — LABETALOL HCL 200 MG PO TABS: 100.0000 mg | ORAL_TABLET | Freq: Two times a day (BID) | ORAL | Status: DC

## 2015-03-15 MED ORDER — MIDAZOLAM HCL 2 MG/2ML IJ SOLN: 1.0000 mg | Freq: Once | INTRAMUSCULAR | Status: AC

## 2015-03-15 MED ORDER — TRAZODONE HCL 50 MG PO TABS
150.0000 mg | ORAL_TABLET | Freq: Every day | ORAL | Status: DC
  Administered 2015-03-15: 150 mg via ORAL
  Filled 2015-03-15 (×2): qty 1

## 2015-03-15 MED ORDER — SODIUM CHLORIDE 0.9 % WEIGHT BASED INFUSION: 1.0000 mL/kg/h | INTRAVENOUS | Status: DC

## 2015-03-15 MED ORDER — MIDAZOLAM HCL 2 MG/2ML IJ SOLN
INTRAMUSCULAR | Status: DC | PRN
Start: 2015-03-15 — End: 2015-03-15
  Administered 2015-03-15 (×2): 1 mg via INTRAVENOUS

## 2015-03-15 MED ORDER — CYCLOBENZAPRINE HCL 10 MG PO TABS
10.0000 mg | ORAL_TABLET | Freq: Three times a day (TID) | ORAL | Status: DC | PRN
  Administered 2015-03-15 – 2015-03-17 (×3): 10 mg via ORAL
  Filled 2015-03-15 (×3): qty 1

## 2015-03-15 MED ORDER — HEPARIN (PORCINE) IN NACL 2-0.9 UNIT/ML-% IJ SOLN
INTRAMUSCULAR | Status: AC
  Filled 2015-03-15: qty 1000

## 2015-03-15 MED ORDER — ONDANSETRON HCL 4 MG/2ML IJ SOLN: 4.0000 mg | Freq: Four times a day (QID) | INTRAMUSCULAR | Status: DC | PRN

## 2015-03-15 MED ORDER — LABETALOL HCL 5 MG/ML IV SOLN
INTRAVENOUS | Status: AC
  Filled 2015-03-15: qty 4

## 2015-03-15 MED ORDER — OXYCODONE-ACETAMINOPHEN 5-325 MG PO TABS: 1.0000 | ORAL_TABLET | ORAL | Status: DC | PRN

## 2015-03-15 MED ORDER — LABETALOL HCL 5 MG/ML IV SOLN
INTRAVENOUS | Status: DC | PRN
  Administered 2015-03-15: 20 mg via INTRAVENOUS

## 2015-03-15 MED ORDER — SODIUM CHLORIDE 0.9 % WEIGHT BASED INFUSION
3.0000 mL/kg/h | INTRAVENOUS | Status: AC
  Administered 2015-03-15: 3 mL/kg/h via INTRAVENOUS

## 2015-03-15 MED ORDER — MIDAZOLAM HCL 2 MG/2ML IJ SOLN
INTRAMUSCULAR | Status: AC
  Administered 2015-03-15: 1 mg
  Filled 2015-03-15: qty 2

## 2015-03-15 MED ORDER — SODIUM CHLORIDE 0.9 % IJ SOLN: 3.0000 mL | Freq: Two times a day (BID) | INTRAMUSCULAR | Status: DC

## 2015-03-15 MED ORDER — TRAMADOL HCL 50 MG PO TABS
50.0000 mg | ORAL_TABLET | Freq: Four times a day (QID) | ORAL | Status: DC | PRN
  Administered 2015-03-15 – 2015-03-16 (×2): 100 mg via ORAL
  Filled 2015-03-15 (×2): qty 2

## 2015-03-15 MED ORDER — CLOPIDOGREL BISULFATE 75 MG PO TABS
75.0000 mg | ORAL_TABLET | Freq: Every day | ORAL | Status: DC
  Administered 2015-03-17: 75 mg via ORAL
  Filled 2015-03-15: qty 1

## 2015-03-15 MED ORDER — ASPIRIN EC 325 MG PO TBEC
325.0000 mg | DELAYED_RELEASE_TABLET | Freq: Every day | ORAL | Status: DC
  Administered 2015-03-15: 325 mg via ORAL
  Filled 2015-03-15: qty 1

## 2015-03-15 MED ORDER — SODIUM CHLORIDE 0.9 % IJ SOLN: 3.0000 mL | INTRAMUSCULAR | Status: DC | PRN

## 2015-03-15 MED ORDER — DIAZEPAM 2 MG PO TABS: 2.0000 mg | ORAL_TABLET | ORAL | Status: DC | PRN

## 2015-03-15 MED ORDER — NICOTINE 21 MG/24HR TD PT24
21.0000 mg | MEDICATED_PATCH | Freq: Every day | TRANSDERMAL | Status: DC
  Administered 2015-03-15 – 2015-03-17 (×3): 21 mg via TRANSDERMAL
  Filled 2015-03-15 (×4): qty 1

## 2015-03-15 MED ORDER — FENTANYL CITRATE (PF) 100 MCG/2ML IJ SOLN
INTRAMUSCULAR | Status: DC | PRN
  Administered 2015-03-15 (×2): 25 ug via INTRAVENOUS
  Administered 2015-03-15: 50 ug via INTRAVENOUS

## 2015-03-15 MED ORDER — FENTANYL CITRATE (PF) 100 MCG/2ML IJ SOLN
INTRAMUSCULAR | Status: AC
  Filled 2015-03-15: qty 2

## 2015-03-15 MED ORDER — ASPIRIN EC 325 MG PO TBEC: 325.0000 mg | DELAYED_RELEASE_TABLET | Freq: Every day | ORAL | Status: DC

## 2015-03-15 MED ORDER — BIVALIRUDIN 250 MG IV SOLR
INTRAVENOUS | Status: AC
  Filled 2015-03-15: qty 250

## 2015-03-15 MED ORDER — CLOPIDOGREL BISULFATE 75 MG PO TABS: 300.0000 mg | ORAL_TABLET | ORAL | Status: DC

## 2015-03-15 SURGICAL SUPPLY — 9 items
CATH INFINITI 5FR ANG PIGTAIL (CATHETERS) ×3 IMPLANT
CATH INFINITI 5FR JL4 (CATHETERS) ×3 IMPLANT
CATH INFINITI JR4 5F (CATHETERS) ×3 IMPLANT
DEVICE CLOSURE MYNXGRIP 5F (Vascular Products) ×3 IMPLANT
KIT MANI 3VAL PERCEP (MISCELLANEOUS) ×3 IMPLANT
NEEDLE PERC 18GX7CM (NEEDLE) ×3 IMPLANT
PACK CARDIAC CATH (CUSTOM PROCEDURE TRAY) ×3 IMPLANT
SHEATH PINNACLE 5F 10CM (SHEATH) ×3 IMPLANT
WIRE EMERALD 3MM-J .035X150CM (WIRE) ×3 IMPLANT

## 2015-03-15 NOTE — Progress Notes (Signed)
Meal from cafeteria given to pt, pt/wife updated will be going to room 256

## 2015-03-15 NOTE — Progress Notes (Signed)
Report called to nurse brittany, right groin hematoma unchanged, pt denies cp, gcs 15, pt appears calm, vss., wife at bedside

## 2015-03-15 NOTE — Progress Notes (Signed)
Pt/wife updated will be admitted to room and procedure will be done tomorrow by nurse vickey, sandwich meal and drink given to both

## 2015-03-16 ENCOUNTER — Encounter
Admission: RE | Disposition: A | Discharge status: Home / Self Care | Payer: Self-pay | Source: Ambulatory Visit | Attending: Cardiovascular Disease

## 2015-03-16 DIAGNOSIS — F17210 Nicotine dependence, cigarettes, uncomplicated: Secondary | ICD-10-CM | POA: Diagnosis present

## 2015-03-16 DIAGNOSIS — I739 Peripheral vascular disease, unspecified: Secondary | ICD-10-CM | POA: Diagnosis present

## 2015-03-16 DIAGNOSIS — I25110 Atherosclerotic heart disease of native coronary artery with unstable angina pectoris: Secondary | ICD-10-CM | POA: Diagnosis present

## 2015-03-16 DIAGNOSIS — E785 Hyperlipidemia, unspecified: Secondary | ICD-10-CM | POA: Diagnosis present

## 2015-03-16 DIAGNOSIS — Z8673 Personal history of transient ischemic attack (TIA), and cerebral infarction without residual deficits: Secondary | ICD-10-CM | POA: Diagnosis not present

## 2015-03-16 DIAGNOSIS — I2510 Atherosclerotic heart disease of native coronary artery without angina pectoris: Secondary | ICD-10-CM | POA: Diagnosis present

## 2015-03-16 DIAGNOSIS — I10 Essential (primary) hypertension: Secondary | ICD-10-CM | POA: Diagnosis present

## 2015-03-16 DIAGNOSIS — I200 Unstable angina: Secondary | ICD-10-CM | POA: Diagnosis present

## 2015-03-16 HISTORY — PX: CARDIAC CATHETERIZATION: SHX172

## 2015-03-16 LAB — CBC
HCT: 38.6 % — ABNORMAL LOW (ref 40.0–52.0)
Hemoglobin: 12.5 g/dL — ABNORMAL LOW (ref 13.0–18.0)
MCH: 33.6 pg (ref 26.0–34.0)
MCHC: 32.4 g/dL (ref 32.0–36.0)
MCV: 103.7 fL — AB (ref 80.0–100.0)
PLATELETS: 238 10*3/uL (ref 150–440)
RBC: 3.73 MIL/uL — AB (ref 4.40–5.90)
RDW: 15.2 % — ABNORMAL HIGH (ref 11.5–14.5)
WBC: 8.6 10*3/uL (ref 3.8–10.6)

## 2015-03-16 LAB — BASIC METABOLIC PANEL
ANION GAP: 9 (ref 5–15)
BUN: 9 mg/dL (ref 6–20)
CO2: 27 mmol/L (ref 22–32)
Calcium: 8.8 mg/dL — ABNORMAL LOW (ref 8.9–10.3)
Chloride: 105 mmol/L (ref 101–111)
Creatinine, Ser: 0.6 mg/dL — ABNORMAL LOW (ref 0.61–1.24)
GFR calc Af Amer: >60 mL/min (ref >60)
GLUCOSE: 116 mg/dL — AB (ref 65–99)
POTASSIUM: 3.9 mmol/L (ref 3.5–5.1)
Sodium: 141 mmol/L (ref 135–145)

## 2015-03-16 SURGERY — CORONARY STENT INTERVENTION
Anesthesia: Moderate Sedation

## 2015-03-16 MED ORDER — SODIUM CHLORIDE 0.9 % IV SOLN: 250.0000 mL | INTRAVENOUS | Status: DC | PRN

## 2015-03-16 MED ORDER — SODIUM CHLORIDE 0.9 % IJ SOLN: 3.0000 mL | Freq: Two times a day (BID) | INTRAMUSCULAR | Status: DC

## 2015-03-16 MED ORDER — AMLODIPINE BESYLATE 5 MG PO TABS
ORAL_TABLET | ORAL | Status: AC
  Administered 2015-03-16: 10 mg
  Filled 2015-03-16: qty 2

## 2015-03-16 MED ORDER — SODIUM CHLORIDE 0.9 % WEIGHT BASED INFUSION
3.0000 mL/kg/h | INTRAVENOUS | Status: DC
Start: 2015-03-17 — End: 2015-03-16

## 2015-03-16 MED ORDER — LABETALOL HCL 5 MG/ML IV SOLN
INTRAVENOUS | Status: AC
  Filled 2015-03-16: qty 4

## 2015-03-16 MED ORDER — SODIUM CHLORIDE 0.9 % IJ SOLN: 3.0000 mL | INTRAMUSCULAR | Status: DC | PRN

## 2015-03-16 MED ORDER — NITROGLYCERIN 5 MG/ML IV SOLN
INTRAVENOUS | Status: AC
  Filled 2015-03-16: qty 10

## 2015-03-16 MED ORDER — CLOPIDOGREL BISULFATE 75 MG PO TABS
ORAL_TABLET | ORAL | Status: AC
  Filled 2015-03-16: qty 1

## 2015-03-16 MED ORDER — ONDANSETRON HCL 4 MG/2ML IJ SOLN: 4.0000 mg | Freq: Four times a day (QID) | INTRAMUSCULAR | Status: DC | PRN

## 2015-03-16 MED ORDER — FENTANYL CITRATE (PF) 100 MCG/2ML IJ SOLN
INTRAMUSCULAR | Status: DC | PRN
  Administered 2015-03-16: 50 ug via INTRAVENOUS

## 2015-03-16 MED ORDER — CLOPIDOGREL BISULFATE 300 MG PO TABS
ORAL_TABLET | ORAL | Status: DC | PRN
  Administered 2015-03-16: 600 mg via ORAL

## 2015-03-16 MED ORDER — ASPIRIN 81 MG PO CHEW: 81.0000 mg | CHEWABLE_TABLET | ORAL | Status: DC

## 2015-03-16 MED ORDER — MIDAZOLAM HCL 2 MG/2ML IJ SOLN
INTRAMUSCULAR | Status: DC | PRN
  Administered 2015-03-16: 2 mg via INTRAVENOUS

## 2015-03-16 MED ORDER — HYDROMORPHONE HCL 1 MG/ML IJ SOLN
INTRAMUSCULAR | Status: AC
  Administered 2015-03-16: 1 mg
  Filled 2015-03-16: qty 1

## 2015-03-16 MED ORDER — SODIUM CHLORIDE 0.9 % WEIGHT BASED INFUSION: 1.0000 mL/kg/h | INTRAVENOUS | Status: DC

## 2015-03-16 MED ORDER — ASPIRIN 81 MG PO CHEW
CHEWABLE_TABLET | ORAL | Status: DC | PRN
  Administered 2015-03-16: 324 mg via ORAL

## 2015-03-16 MED ORDER — MIDAZOLAM HCL 2 MG/2ML IJ SOLN
INTRAMUSCULAR | Status: AC
  Filled 2015-03-16: qty 2

## 2015-03-16 MED ORDER — SODIUM CHLORIDE 0.9 % IV SOLN
250.0000 mg | INTRAVENOUS | Status: DC | PRN
  Administered 2015-03-16: 1.75 mg/kg/h via INTRAVENOUS

## 2015-03-16 MED ORDER — NITROGLYCERIN 1 MG/10 ML FOR IR/CATH LAB
INTRA_ARTERIAL | Status: DC | PRN
  Administered 2015-03-16: 200 ug via INTRA_ARTERIAL

## 2015-03-16 MED ORDER — VERAPAMIL HCL 2.5 MG/ML IV SOLN
INTRAVENOUS | Status: AC
  Filled 2015-03-16: qty 2

## 2015-03-16 MED ORDER — SODIUM CHLORIDE 0.9 % WEIGHT BASED INFUSION: 3.0000 mL/kg/h | INTRAVENOUS | Status: DC

## 2015-03-16 MED ORDER — BIVALIRUDIN 250 MG IV SOLR
INTRAVENOUS | Status: AC
  Filled 2015-03-16: qty 250

## 2015-03-16 MED ORDER — CLOPIDOGREL BISULFATE 75 MG PO TABS: 75.0000 mg | ORAL_TABLET | Freq: Every day | ORAL | Status: DC

## 2015-03-16 MED ORDER — SODIUM CHLORIDE 0.9 % IV SOLN
0.2500 mg/kg/h | INTRAVENOUS | Status: DC
  Filled 2015-03-16: qty 250

## 2015-03-16 MED ORDER — LABETALOL HCL 5 MG/ML IV SOLN
20.0000 mg | Freq: Once | INTRAVENOUS | Status: AC
  Administered 2015-03-16: 20 mg via INTRAVENOUS

## 2015-03-16 MED ORDER — FENTANYL CITRATE (PF) 100 MCG/2ML IJ SOLN
INTRAMUSCULAR | Status: AC
  Filled 2015-03-16: qty 2

## 2015-03-16 MED ORDER — HEPARIN (PORCINE) IN NACL 2-0.9 UNIT/ML-% IJ SOLN
INTRAMUSCULAR | Status: AC
  Filled 2015-03-16: qty 500

## 2015-03-16 MED ORDER — ASPIRIN EC 325 MG PO TBEC: 325.0000 mg | DELAYED_RELEASE_TABLET | Freq: Every day | ORAL | Status: DC

## 2015-03-16 MED ORDER — ACETAMINOPHEN 325 MG PO TABS: 650.0000 mg | ORAL_TABLET | ORAL | Status: DC | PRN

## 2015-03-16 MED ORDER — OXYCODONE-ACETAMINOPHEN 5-325 MG PO TABS: 1.0000 | ORAL_TABLET | ORAL | Status: DC | PRN

## 2015-03-16 MED ORDER — BIVALIRUDIN BOLUS VIA INFUSION - CUPID
INTRAVENOUS | Status: DC | PRN
  Administered 2015-03-16: 67.425 mg via INTRAVENOUS

## 2015-03-16 MED ORDER — ASPIRIN 81 MG PO CHEW
CHEWABLE_TABLET | ORAL | Status: AC
  Filled 2015-03-16: qty 4

## 2015-03-16 MED ORDER — AMLODIPINE BESYLATE 10 MG PO TABS: 10.0000 mg | ORAL_TABLET | Freq: Once | ORAL | Status: DC

## 2015-03-16 MED ORDER — CLOPIDOGREL BISULFATE 75 MG PO TABS
ORAL_TABLET | ORAL | Status: AC
  Filled 2015-03-16: qty 8

## 2015-03-16 MED ORDER — SODIUM CHLORIDE 0.9 % IV SOLN
250.0000 mL | INTRAVENOUS | Status: DC | PRN
Start: 2015-03-16 — End: 2015-03-16

## 2015-03-16 MED ORDER — IOHEXOL 300 MG/ML  SOLN
INTRAMUSCULAR | Status: DC | PRN
  Administered 2015-03-16: 80 mL via INTRA_ARTERIAL
  Administered 2015-03-16: 35 mL via INTRA_ARTERIAL

## 2015-03-16 MED ORDER — LABETALOL HCL 5 MG/ML IV SOLN
INTRAVENOUS | Status: DC | PRN
  Administered 2015-03-16: 10 mg via INTRAVENOUS

## 2015-03-16 MED ORDER — HYDROMORPHONE HCL 1 MG/ML IJ SOLN: 1.0000 mg | INTRAMUSCULAR | Status: DC | PRN

## 2015-03-16 SURGICAL SUPPLY — 11 items
BALLN TREK RX 2.5X20 (BALLOONS) ×3
CATH VISTA GUIDE 6FR JR4 SH (CATHETERS) ×3
DEVICE CLOSURE MYNXGRIP 6/7F (Vascular Products) ×3 IMPLANT
DEVICE INFLAT 30 PLUS (MISCELLANEOUS) ×3
KIT MANI 3VAL PERCEP (MISCELLANEOUS) ×3
NEEDLE PERC 18GX7CM (NEEDLE) ×3
PACK CARDIAC CATH (CUSTOM PROCEDURE TRAY) ×3
SHEATH AVANTI 6FR X 11CM (SHEATH) ×3
STENT XIENCE ALPINE RX 3.5X28 (Permanent Stent) ×3 IMPLANT
WIRE EMERALD 3MM-J .035X150CM (WIRE) ×3
WIRE G HI TQ BMW 190 (WIRE) ×3

## 2015-03-16 NOTE — Op Note (Signed)
The PCI and stent of proximal RCA with DES 3.5-28mm 20atm  90->0%  the patient tolerated the procedure well from the right groin  Angiomax half-strength for 2 hours Plavix aspirin  anticipate discharge home in the morning

## 2015-03-16 NOTE — Progress Notes (Signed)
SUBJECTIVE: Patient denies any chest pain   Filed Vitals:   03/15/15 1900 03/15/15 2019 03/15/15 2351 03/16/15 0418  BP: 149/83 149/89 137/85 134/82  Pulse: 68 66 68 69  Temp:  98.5 F (36.9 C) 97.5 F (36.4 C) 97.4 F (36.3 C)  TempSrc:  Oral Oral Oral  Resp: 16 20  18   Height:      Weight:  89.903 kg (198 lb 3.2 oz)  89.903 kg (198 lb 3.2 oz)  SpO2: 98% 100% 98% 99%    Intake/Output Summary (Last 24 hours) at 03/16/15 0856 Last data filed at 03/16/15 0600  Gross per 24 hour  Intake 2517.6 ml  Output   1100 ml  Net 1417.6 ml    LABS: Basic Metabolic Panel:  Recent Labs  03/54/65 0543  NA 141  K 3.9  CL 105  CO2 27  GLUCOSE 116*  BUN 9  CREATININE 0.60*  CALCIUM 8.8*   Liver Function Tests: No results for input(s): AST, ALT, ALKPHOS, BILITOT, PROT, ALBUMIN in the last 72 hours. No results for input(s): LIPASE, AMYLASE in the last 72 hours. CBC:  Recent Labs  03/16/15 0543  WBC 8.6  HGB 12.5*  HCT 38.6*  MCV 103.7*  PLT 238   Cardiac Enzymes: No results for input(s): CKTOTAL, CKMB, CKMBINDEX, TROPONINI in the last 72 hours. BNP: Invalid input(s): POCBNP D-Dimer: No results for input(s): DDIMER in the last 72 hours. Hemoglobin A1C: No results for input(s): HGBA1C in the last 72 hours. Fasting Lipid Panel: No results for input(s): CHOL, HDL, LDLCALC, TRIG, CHOLHDL, LDLDIRECT in the last 72 hours. Thyroid Function Tests: No results for input(s): TSH, T4TOTAL, T3FREE, THYROIDAB in the last 72 hours.  Invalid input(s): FREET3 Anemia Panel: No results for input(s): VITAMINB12, FOLATE, FERRITIN, TIBC, IRON, RETICCTPCT in the last 72 hours.   PHYSICAL EXAM General: Well developed, well nourished, in no acute distress HEENT:  Normocephalic and atramatic Neck:  No JVD.  Lungs: Clear bilaterally to auscultation and percussion. Heart: HRRR . Normal S1 and S2 without gallops or murmurs.  Abdomen: Bowel sounds are positive, abdomen soft and non-tender   Msk:  Back normal, normal gait. Normal strength and tone for age. Extremities: No clubbing, cyanosis or edema.   Neuro: Alert and oriented X 3. Psych:  Good affect, responds appropriately  TELEMETRY: Sinus rhythm  ASSESSMENT AND PLAN: Unstable angina/acute coronary syndrome with high-grade lesion in the right coronary artery. Patient had 95% mid RCA lesion yesterday and developed hematoma of the right groin thus angioplasty was deferred to today because of possibility of worsening of hematoma on the right groin. Patient will have PCI of the right coronary artery today  Active Problems:   Unstable angina pectoris    Jenisha Faison A, MD, [facility] 03/16/2015 8:56 AM

## 2015-03-16 NOTE — Plan of Care (Signed)
Problem: Phase I Progression Outcomes Goal: Other Phase I Outcomes/Goals Outcome: Progressing Pt is alert and oriented x 4, pt down for stent placement in am, denies pain after procedure, is up with minimal assist, incision through right femoral, site is dry, clean and intact. Wife at bedside, no chest pain, on room air, ativan given for anxiety, good appetite, vital signs stable, likely to d/c in am. IV fluids infusing.

## 2015-03-17 MED ORDER — AMLODIPINE BESYLATE 10 MG PO TABS: 10.0000 mg | ORAL_TABLET | Freq: Once | ORAL | Status: DC

## 2015-03-17 MED ORDER — NICOTINE 21 MG/24HR TD PT24: 21.0000 mg | MEDICATED_PATCH | Freq: Every day | TRANSDERMAL | Status: DC

## 2015-03-17 MED ORDER — CLOPIDOGREL BISULFATE 75 MG PO TABS: 75.0000 mg | ORAL_TABLET | Freq: Every day | ORAL | Status: DC

## 2015-03-17 NOTE — Discharge Summary (Signed)
Physician Discharge Summary  Patient ID: Brandon [name] MRN: [id number] DOB/AGE: 51/25/1967 51 y.o.  Admit date: 03/15/2015 Discharge date: 03/17/2015  Admission Diagnoses:  Discharge Diagnoses:  Active Problems:   Unstable angina pectoris   CAD in native artery   Discharged Condition: good  Hospital Course: Pt had PCI to mid RCA.   Consults: cardiology  Significant Diagnostic Studies: angiography: Left heart cath  Treatments: cardiac meds: plavix  Discharge Exam: Blood pressure 145/82, pulse 73, temperature 97.8 F (36.6 C), temperature source Oral, resp. rate 18, height 6\' 1"  (1.854 m), weight 89.858 kg (198 lb 1.6 oz), SpO2 99 %. Incision/Wound: no bruit, minimal ecchymosis  Disposition: 01-Home or Self Care     Medication List    TAKE these medications        albuterol (2.5 MG/3ML) 0.083% nebulizer solution  Commonly known as:  PROVENTIL  Take 2.5 mg by nebulization as needed for wheezing or shortness of breath.     amLODipine 10 MG tablet  Commonly known as:  NORVASC  Take 1 tablet (10 mg total) by mouth once.     aspirin 81 MG tablet  Take 81 mg by mouth daily.     buPROPion 150 MG 12 hr tablet  Commonly known as:  WELLBUTRIN SR  Start 1 in the am for 3 days then 1 tab twice daily.     clopidogrel 75 MG tablet  Commonly known as:  PLAVIX  Take 1 tablet (75 mg total) by mouth daily with breakfast.     cyclobenzaprine 10 MG tablet  Commonly known as:  FLEXERIL  Take 1 tablet (10 mg total) by mouth 3 (three) times daily as needed for muscle spasms.     famotidine 20 MG tablet  Commonly known as:  PEPCID  Take 1 tablet (20 mg total) by mouth 2 (two) times daily as needed for heartburn.     FISH OIL PO  Take 1 capsule by mouth daily.     folic acid 1 MG tablet  Commonly known as:  FOLVITE  Take 1 mg by mouth daily.     hydrochlorothiazide 12.5 MG capsule  Commonly known as:  MICROZIDE  Take 12.5 mg by mouth daily.     lubiprostone 24 MCG  capsule  Commonly known as:  AMITIZA  Take 24 mcg by mouth as needed for constipation.     nicotine 21 mg/24hr patch  Commonly known as:  NICODERM CQ - dosed in mg/24 hours  Place 1 patch (21 mg total) onto the skin daily.     ondansetron 4 MG tablet  Commonly known as:  ZOFRAN  Take 1 tablet (4 mg total) by mouth every 6 (six) hours.     simvastatin 20 MG tablet  Commonly known as:  ZOCOR  Take 20 mg by mouth daily.     SPIRIVA HANDIHALER 18 MCG inhalation capsule  Generic drug:  tiotropium  Place into inhaler and inhale.     traMADol 50 MG tablet  Commonly known as:  ULTRAM  TAKE 1 TO 2 TABLETS BY MOUTH EVERY 6 HOURS AS NEEDED FOR PAIN     traZODone 150 MG tablet  Commonly known as:  DESYREL  Take 1 tablet (150 mg total) by mouth at bedtime.     VITAMIN-B COMPLEX PO  Take 800 mg by mouth daily.     ZENPEP 25000 UNITS Cpep  Generic drug:  Pancrelipase (Lip-Prot-Amyl)  Take 1-2 capsules by mouth 3 (three) times daily with meals.  Follow-up Information    Go to Laurier Nancy, MD.   Specialty:  Cardiology   Why:  For wound re-check, 6/28 at [facility] information:   [address] [place] [address] [phone]       Signed: Rise Mu 03/17/2015, 9:04 AM

## 2015-03-17 NOTE — Progress Notes (Signed)
Removed telemetry and removed iv.  rx called to [facility].  Vascular discharge instructions given.  Smoking cessation info given and etoh cessation given.  No questions at this time.  Escorted out of hospital via wheelchair by volunteers.

## 2015-03-17 NOTE — Discharge Instructions (Signed)
Patient ok to go home with aspirin, plavix, statin, and nicotine patch. Pt encouraged to stop smoking. Call office number with any concerns or questions over the weekend.

## 2015-03-21 ENCOUNTER — Encounter: Payer: Self-pay | Admitting: Internal Medicine

## 2015-04-06 ENCOUNTER — Encounter
Admission: RE | Disposition: A | Discharge status: Home / Self Care | Payer: Self-pay | Source: Ambulatory Visit | Attending: Vascular Surgery

## 2015-04-06 ENCOUNTER — Encounter: Payer: Self-pay | Admitting: *Deleted

## 2015-04-06 ENCOUNTER — Inpatient Hospital Stay
Admission: RE | Admit: 2015-04-06 | Discharge: 2015-04-07 | DRG: 036 | Disposition: A | Discharge status: Home / Self Care | Payer: Medicaid Other | Source: Ambulatory Visit | Attending: Vascular Surgery | Admitting: Vascular Surgery

## 2015-04-06 DIAGNOSIS — I2510 Atherosclerotic heart disease of native coronary artery without angina pectoris: Secondary | ICD-10-CM | POA: Diagnosis present

## 2015-04-06 DIAGNOSIS — Z8673 Personal history of transient ischemic attack (TIA), and cerebral infarction without residual deficits: Secondary | ICD-10-CM

## 2015-04-06 DIAGNOSIS — I6521 Occlusion and stenosis of right carotid artery: Secondary | ICD-10-CM | POA: Diagnosis present

## 2015-04-06 DIAGNOSIS — I6522 Occlusion and stenosis of left carotid artery: Secondary | ICD-10-CM | POA: Diagnosis present

## 2015-04-06 DIAGNOSIS — I6529 Occlusion and stenosis of unspecified carotid artery: Secondary | ICD-10-CM | POA: Diagnosis present

## 2015-04-06 HISTORY — PX: PERIPHERAL VASCULAR CATHETERIZATION: SHX172C

## 2015-04-06 LAB — CREATININE, SERUM
Creatinine, Ser: 0.6 mg/dL — ABNORMAL LOW (ref 0.61–1.24)
GFR calc Af Amer: >60 mL/min (ref >60)
GFR calc non Af Amer: >60 mL/min (ref >60)

## 2015-04-06 LAB — BUN: BUN: 10 mg/dL (ref 6–20)

## 2015-04-06 LAB — MRSA PCR SCREENING: MRSA BY PCR: NEGATIVE

## 2015-04-06 LAB — GLUCOSE, CAPILLARY: GLUCOSE-CAPILLARY: 137 mg/dL — AB (ref 65–99)

## 2015-04-06 SURGERY — CAROTID PTA/STENT INTERVENTION
Anesthesia: Moderate Sedation | Laterality: Left

## 2015-04-06 MED ORDER — MIDAZOLAM HCL 5 MG/5ML IJ SOLN
INTRAMUSCULAR | Status: AC
  Filled 2015-04-06: qty 5

## 2015-04-06 MED ORDER — DOPAMINE-DEXTROSE 3.2-5 MG/ML-% IV SOLN: 3.0000 ug/kg/min | INTRAVENOUS | Status: DC

## 2015-04-06 MED ORDER — PHENOL 1.4 % MT LIQD
1.0000 | OROMUCOSAL | Status: DC | PRN
  Filled 2015-04-06: qty 177

## 2015-04-06 MED ORDER — FENTANYL CITRATE (PF) 100 MCG/2ML IJ SOLN
INTRAMUSCULAR | Status: AC
  Filled 2015-04-06: qty 2

## 2015-04-06 MED ORDER — HEPARIN SODIUM (PORCINE) 1000 UNIT/ML IJ SOLN
INTRAMUSCULAR | Status: AC
  Filled 2015-04-06: qty 1

## 2015-04-06 MED ORDER — MORPHINE SULFATE 4 MG/ML IJ SOLN
2.0000 mg | INTRAMUSCULAR | Status: DC | PRN
  Administered 2015-04-06: 2 mg via INTRAVENOUS
  Filled 2015-04-06: qty 1

## 2015-04-06 MED ORDER — FAMOTIDINE IN NACL 20-0.9 MG/50ML-% IV SOLN
20.0000 mg | Freq: Two times a day (BID) | INTRAVENOUS | Status: DC
  Administered 2015-04-06: 20 mg via INTRAVENOUS
  Filled 2015-04-06 (×5): qty 50

## 2015-04-06 MED ORDER — GUAIFENESIN-DM 100-10 MG/5ML PO SYRP: 15.0000 mL | ORAL_SOLUTION | ORAL | Status: DC | PRN

## 2015-04-06 MED ORDER — HYDRALAZINE HCL 20 MG/ML IJ SOLN: 5.0000 mg | INTRAMUSCULAR | Status: DC | PRN

## 2015-04-06 MED ORDER — CEFAZOLIN SODIUM 1-5 GM-% IV SOLN
INTRAVENOUS | Status: AC
  Filled 2015-04-06: qty 50

## 2015-04-06 MED ORDER — ASPIRIN EC 81 MG PO TBEC
81.0000 mg | DELAYED_RELEASE_TABLET | Freq: Every day | ORAL | Status: DC
  Administered 2015-04-06: 81 mg via ORAL

## 2015-04-06 MED ORDER — CEFAZOLIN SODIUM 1-5 GM-% IV SOLN
1.0000 g | Freq: Once | INTRAVENOUS | Status: AC
  Administered 2015-04-06: 1 g via INTRAVENOUS

## 2015-04-06 MED ORDER — FENTANYL CITRATE (PF) 100 MCG/2ML IJ SOLN
INTRAMUSCULAR | Status: DC | PRN
  Administered 2015-04-06: 50 ug via INTRAVENOUS

## 2015-04-06 MED ORDER — ATROPINE SULFATE 0.1 MG/ML IJ SOLN
INTRAMUSCULAR | Status: DC | PRN
  Administered 2015-04-06 (×2): .4 mg via INTRAVENOUS

## 2015-04-06 MED ORDER — CEFUROXIME SODIUM 1.5 G IJ SOLR
1.5000 g | Freq: Two times a day (BID) | INTRAMUSCULAR | Status: AC
  Administered 2015-04-06 (×2): 1.5 g via INTRAVENOUS
  Filled 2015-04-06 (×2): qty 1.5

## 2015-04-06 MED ORDER — ALUM & MAG HYDROXIDE-SIMETH 200-200-20 MG/5ML PO SUSP: 15.0000 mL | ORAL | Status: DC | PRN

## 2015-04-06 MED ORDER — ONDANSETRON HCL 4 MG/2ML IJ SOLN: 4.0000 mg | Freq: Four times a day (QID) | INTRAMUSCULAR | Status: DC | PRN

## 2015-04-06 MED ORDER — CLOPIDOGREL BISULFATE 75 MG PO TABS
ORAL_TABLET | ORAL | Status: AC
  Administered 2015-04-06: 75 mg
  Filled 2015-04-06: qty 1

## 2015-04-06 MED ORDER — MAGNESIUM SULFATE 2 GM/50ML IV SOLN
2.0000 g | Freq: Every day | INTRAVENOUS | Status: AC | PRN
  Administered 2015-04-06: 2 g via INTRAVENOUS
  Filled 2015-04-06: qty 50

## 2015-04-06 MED ORDER — DEXTROSE 5 % IV SOLN
1.5000 g | Freq: Two times a day (BID) | INTRAVENOUS | Status: DC
  Filled 2015-04-06 (×2): qty 1.5

## 2015-04-06 MED ORDER — CLOPIDOGREL BISULFATE 75 MG PO TABS
75.0000 mg | ORAL_TABLET | Freq: Every day | ORAL | Status: DC
  Administered 2015-04-06: 75 mg via ORAL

## 2015-04-06 MED ORDER — ATROPINE SULFATE 0.1 MG/ML IJ SOLN
INTRAMUSCULAR | Status: AC
  Filled 2015-04-06: qty 10

## 2015-04-06 MED ORDER — FENTANYL CITRATE (PF) 100 MCG/2ML IJ SOLN
INTRAMUSCULAR | Status: AC | PRN
  Administered 2015-04-06: 50 ug via INTRAVENOUS

## 2015-04-06 MED ORDER — SODIUM CHLORIDE 0.9 % IV SOLN
INTRAVENOUS | Status: DC
  Administered 2015-04-06: 09:00:00 via INTRAVENOUS

## 2015-04-06 MED ORDER — MIDAZOLAM HCL 2 MG/2ML IJ SOLN
INTRAMUSCULAR | Status: AC | PRN
  Administered 2015-04-06: 2 mg via INTRAVENOUS

## 2015-04-06 MED ORDER — ASPIRIN 81 MG PO CHEW
CHEWABLE_TABLET | ORAL | Status: AC
  Administered 2015-04-06: 81 mg
  Filled 2015-04-06: qty 1

## 2015-04-06 MED ORDER — LIDOCAINE-EPINEPHRINE (PF) 1 %-1:200000 IJ SOLN
INTRAMUSCULAR | Status: AC
  Filled 2015-04-06: qty 30

## 2015-04-06 MED ORDER — PHENYLEPHRINE HCL 10 MG/ML IJ SOLN
INTRAMUSCULAR | Status: AC
  Filled 2015-04-06: qty 1

## 2015-04-06 MED ORDER — METOPROLOL TARTRATE 1 MG/ML IV SOLN: 2.0000 mg | INTRAVENOUS | Status: DC | PRN

## 2015-04-06 MED ORDER — ACETAMINOPHEN 325 MG PO TABS: 325.0000 mg | ORAL_TABLET | ORAL | Status: DC | PRN

## 2015-04-06 MED ORDER — SODIUM CHLORIDE 0.9 % IV SOLN
INTRAVENOUS | Status: DC | PRN
  Administered 2015-04-06: 250 mL via INTRAVENOUS

## 2015-04-06 MED ORDER — SODIUM CHLORIDE 0.9 % IV SOLN: 500.0000 mL | Freq: Once | INTRAVENOUS | Status: AC | PRN

## 2015-04-06 MED ORDER — LABETALOL HCL 5 MG/ML IV SOLN: 10.0000 mg | INTRAVENOUS | Status: DC | PRN

## 2015-04-06 MED ORDER — OXYCODONE-ACETAMINOPHEN 5-325 MG PO TABS
1.0000 | ORAL_TABLET | ORAL | Status: DC | PRN
  Administered 2015-04-06: 2 via ORAL
  Filled 2015-04-06: qty 2

## 2015-04-06 MED ORDER — ACETAMINOPHEN 325 MG RE SUPP: 325.0000 mg | RECTAL | Status: DC | PRN

## 2015-04-06 MED ORDER — HEPARIN (PORCINE) IN NACL 2-0.9 UNIT/ML-% IJ SOLN
INTRAMUSCULAR | Status: AC
  Filled 2015-04-06: qty 1000

## 2015-04-06 MED ORDER — MIDAZOLAM HCL 2 MG/2ML IJ SOLN
INTRAMUSCULAR | Status: DC | PRN
  Administered 2015-04-06: 1 mg via INTRAVENOUS

## 2015-04-06 MED ORDER — IOHEXOL 300 MG/ML  SOLN
INTRAMUSCULAR | Status: DC | PRN
  Administered 2015-04-06: 55 mL via INTRAVENOUS

## 2015-04-06 MED ORDER — SODIUM CHLORIDE 0.9 % IV SOLN
INTRAVENOUS | Status: DC
  Administered 2015-04-06: 12:00:00 via INTRAVENOUS

## 2015-04-06 MED ORDER — HEPARIN SODIUM (PORCINE) 1000 UNIT/ML IJ SOLN
INTRAMUSCULAR | Status: DC | PRN
  Administered 2015-04-06: 5000 [IU] via INTRAVENOUS
  Administered 2015-04-06: 1000 [IU] via INTRAVENOUS

## 2015-04-06 MED ORDER — POTASSIUM CHLORIDE CRYS ER 20 MEQ PO TBCR
20.0000 meq | EXTENDED_RELEASE_TABLET | Freq: Every day | ORAL | Status: DC | PRN
Start: 2015-04-06 — End: 2015-04-07

## 2015-04-06 SURGICAL SUPPLY — 15 items
BALLN VIATRAC 5X20X135 (BALLOONS) ×3
BALLOON VIATRAC 5X20X135 (BALLOONS) ×1 IMPLANT
CATH ANGIO PIGTAIL 5FR 100 (CATHETERS) ×1 IMPLANT
CATH H1 100CM (CATHETERS) ×3 IMPLANT
CATH PIG 5.0X100 (CATHETERS) ×3
DEVICE EMBOSHIELD NAV6 4.0-7.0 (WIRE) ×3 IMPLANT
DEVICE STARCLOSE SE CLOSURE (Vascular Products) ×3 IMPLANT
GLIDEWIRE ANGLED SS 035X260CM (WIRE) ×3 IMPLANT
KIT CAROTID MANIFOLD (MISCELLANEOUS) ×3 IMPLANT
PACK ANGIOGRAPHY (CUSTOM PROCEDURE TRAY) ×3 IMPLANT
SHEATH BRITE TIP 5FRX11 (SHEATH) ×3 IMPLANT
SHEATH SHUTTLE SELECT 6F (SHEATH) ×3 IMPLANT
STENT XACT CAR 9-7X40X136 (Permanent Stent) ×3 IMPLANT
WIRE G VAS 035X260 STIFF (WIRE) ×3 IMPLANT
WIRE J 3MM .035X145CM (WIRE) ×3 IMPLANT

## 2015-04-06 NOTE — Progress Notes (Signed)
   04/06/15 1500  Clinical Encounter Type  Visited With Patient not available;Other (Comment) (Patient under medication and was sleeping)  Visit Type Other (Comment) (create or update Advance Directive)  Referral From Physician  Consult/Referral To Chaplain  Spiritual Encounters  Spiritual Needs Literature;Brochure  Stress Factors  Patient Stress Factors Health changes  Family Stress Factors None identified  Advance Directives (For Healthcare)  Does patient have an advance directive? No   Chaplain attempted to visit patient and update or create Advance Directive. Patient was sleeping. Chaplain will revisit patient later in the evening.   AD [phone]

## 2015-04-06 NOTE — Progress Notes (Signed)
ANTIBIOTIC CONSULT NOTE - INITIAL  Pharmacy Consult for Renal Adjust Antibiotics Indication: prophylaxis  Allergies  Allergen Reactions  . No Known Allergies     Patient Measurements: Height: 6\' 1"  (185.4 cm) Weight: 198 lb (89.812 kg) IBW/kg (Calculated) : 79.9   Vital Signs: Temp: 97.7 F (36.5 C) (07/14 0827) Temp Source: Oral (07/14 0827) BP: 132/81 mmHg (07/14 1045) Pulse Rate: 67 (07/14 1045) I Labs:  Recent Labs  04/06/15 0814  CREATININE 0.60*   Estimated Creatinine Clearance: 124.8 mL/min (by C-G formula based on Cr of 0.6). No results for input(s): VANCOTROUGH, VANCOPEAK, VANCORANDOM, GENTTROUGH, GENTPEAK, GENTRANDOM, TOBRATROUGH, TOBRAPEAK, TOBRARND, AMIKACINPEAK, AMIKACINTROU, AMIKACIN in the last 72 hours.    Medical History: Past Medical History  Diagnosis Date  . Alcohol abuse   . Pancreatitis     CT findings in May 2011 with inflammation and pseudocyst  . GERD (gastroesophageal reflux disease)   . CAD (coronary artery disease) 06/13/2012    Calcification noted on CTA of chest in 2012 Wall motion abnormality on ECHO    . 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 Hyperlipidemia   . Headache(784.0)     migraine  . Heart disease   . Depression   . Anxiety   . Hypertension   . Carotid artery disease     Medications:  Anti-infectives    Start     Dose/Rate Route Frequency Ordered Stop   04/06/15 1045  cefUROXime (ZINACEF) 1.5 g in dextrose 5 % 50 mL IVPB     1.5 g 100 mL/hr over 30 Minutes Intravenous Every 12 hours 04/06/15 1030 04/07/15 1044   04/06/15 0800  ceFAZolin (ANCEF) IVPB 1 g/50 mL premix     1 g 100 mL/hr over 30 Minutes Intravenous  Once 04/06/15 0759 04/06/15 1011     Assessment: 7/14 -49 yo male here for placement of stent in left carotid artery.  Recent Hx stroke, dizziness, confusion.  Right ICA chronically occluded. Renal function WNL    Plan:  Continue with current regimine  Cefuroxime 1.5gm q12hr  Deshay Kirstein K 04/06/2015,11:00 AM

## 2015-04-06 NOTE — Progress Notes (Signed)
Resting quietly. Neuro exam stable and intact.  Minor remote deficits without any new issues REgular diet OK Check labs in am AF/VSS Access site C/D/I

## 2015-04-06 NOTE — Plan of Care (Signed)
Problem: Phase I Progression Outcomes Goal: Pain controlled with appropriate interventions Outcome: Progressing Morphine  iv for aching at right groin puncture Goal: Incision/dressings dry and intact Outcome: Progressing No hematoma . No bleeding right groin site. Star closure Goal: Initial discharge plan identified Outcome: Progressing Plans to home with wife in am Goal: Vital signs/hemodynamically stable Outcome: Progressing VSS.

## 2015-04-06 NOTE — OR Nursing (Signed)
Dr dew aware of pt taken asa and plavix prior to admission, yes to administer ordered dose.

## 2015-04-06 NOTE — Op Note (Signed)
    OPERATIVE NOTE   PROCEDURE: 1.  ultrasound guidance for vascular access right femoral artery 2.  Placement of a 9-7 Exact stent with the use of the NAV-6 embolic protection device in the left carotid artery   PRE-OPERATIVE DIAGNOSIS: 1. High grade symptomatic left carotid artery stenosis. 2. CAD with recent coronary intervention 3. Right carotid occlusion  POST-OPERATIVE DIAGNOSIS:  Same as above  SURGEON: Festus BarrenJason Dew, MD  ASSISTANT(S):  none  ANESTHESIA: local/MCS  ESTIMATED BLOOD LOSS:  50 cc  CONTRAST: 55 cc  FINDING(S): 1.   85-90% left carotid artery stenosis  SPECIMEN(S):   none  INDICATIONS:   Patient is a 49 yo WM  who presents with high grade left carotid artery stenosis.  He has a recent history of dizziness and confusion and a previous history of stroke.  His right ICA is chronically occluded.  He had a recent coronary intervention and cannot come off Plavix and his lesion is also reasonably high. With the contralateral occlusion and the other risk factors he was felt to be best treated with a carotid artery stent and not carotid endarterectomy. Risks and benefits were discussed and he was agreeable to proceed.  DESCRIPTION: After obtaining full informed written consent, the patient was brought back to the vascular suite and placed supine upon the operating table.  The patient received IV antibiotics prior to induction.  After obtaining adequate anesthesia, the patient was prepped and draped in the standard fashion.   The right femoral artery was visualized with ultrasound and found to be widely patent. It was then accessed under direct ultrasound guidance without difficulty with a Seldinger needle. A permanent image was recorded. A J-wire was placed and we then placed a 6 French sheath. The patient was then heparinized and a total of 6000 units of intravenous heparin were given. A pigtail catheter was then placed into the ascending aorta. This showed normal origins of  the great vessels. I then selectively cannulated the left common carotid artery without difficulty with a headhunter catheter and advanced into the mid left common carotid artery.  Cervical and cerebral carotid angiography was then performed. There were no obvious intracranial filling defects with decent cross filling left to right through the anterior cerebral artery. An approximately 85% stenosis was seen over about the first 1-1/2 cm of the internal carotid artery.  I then advanced into the external carotid artery with a Glidewire and the headhunter catheter and then exchanged for the Amplatz Super Stiff wire. Over the Amplatz Super Stiff wire, a 6 JamaicaFrench shuttle sheath was placed into the mid common carotid artery. I then used the NAV-6  Embolic protection device and crossed the lesion and parked this in the distal internal carotid artery at the base of the skull.  I then selected a 9 mm proximal 7 mm distal 4 cm long tapered Exact stent. This was deployed across the lesion encompassing it in its entirety. A 5 mm diameter by 2 cm length balloon was used to post dilate the stent. Only about a 20 % residual stenosis was present after angioplasty. Completion angiogram showed normal intracranial filling without new defects. At this point I elected to terminate the procedure. The sheath was removed and StarClose closure device was deployed in the left femoral artery with excellent hemostatic result. The patient was taken to the recovery room in stable condition having tolerated the procedure well.  COMPLICATIONS: none  CONDITION: stable  DEW,JASON 04/06/2015 10:23 AM

## 2015-04-06 NOTE — H&P (Signed)
[facility] History & Physical Update  The patient was interviewed and re-examined.  The patient's previous History and Physical has been reviewed and is unchanged.  There is no change in the plan of care. We plan to proceed with the scheduled procedure.  DEW,JASON, MD  04/06/2015, 9:11 AM

## 2015-04-07 ENCOUNTER — Encounter: Payer: Self-pay | Admitting: Vascular Surgery

## 2015-04-07 LAB — BASIC METABOLIC PANEL
Anion gap: 6 (ref 5–15)
BUN: 9 mg/dL (ref 6–20)
CO2: 28 mmol/L (ref 22–32)
CREATININE: 0.55 mg/dL — AB (ref 0.61–1.24)
Calcium: 8.7 mg/dL — ABNORMAL LOW (ref 8.9–10.3)
Chloride: 106 mmol/L (ref 101–111)
GFR calc Af Amer: >60 mL/min (ref >60)
GFR calc non Af Amer: >60 mL/min (ref >60)
GLUCOSE: 91 mg/dL (ref 65–99)
Potassium: 3.6 mmol/L (ref 3.5–5.1)
SODIUM: 140 mmol/L (ref 135–145)

## 2015-04-07 LAB — CBC
HEMATOCRIT: 37.7 % — AB (ref 40.0–52.0)
Hemoglobin: 12.6 g/dL — ABNORMAL LOW (ref 13.0–18.0)
MCH: 33.9 pg (ref 26.0–34.0)
MCHC: 33.4 g/dL (ref 32.0–36.0)
MCV: 101.4 fL — ABNORMAL HIGH (ref 80.0–100.0)
Platelets: 157 10*3/uL (ref 150–440)
RBC: 3.71 MIL/uL — AB (ref 4.40–5.90)
RDW: 15.1 % — ABNORMAL HIGH (ref 11.5–14.5)
WBC: 7.2 10*3/uL (ref 3.8–10.6)

## 2015-04-07 NOTE — Discharge Instructions (Signed)
Call or contact our office with fever >101, wound redness, severe pain, neurologic changes, or other issues No driving one week  Leave dressing on for one day then remove when you shower. Monitor insertion site for signs of infection listed above.

## 2015-04-07 NOTE — Discharge Summary (Signed)
[facility]    Discharge Summary    Patient ID:  Brandon [name] MRN: [id number] DOB/AGE: 03/31/1966 49 y.o.  Admit date: 04/06/2015 Discharge date: 04/07/2015 Date of Surgery: 04/06/2015 Surgeon: Surgeon(s): Annice NeedyJason S Dew, MD  Admission Diagnosis: Lt Carotid Artery Stenosis  Discharge Diagnoses:  Lt Carotid Artery Stenosis  Secondary Diagnoses: Past Medical History  Diagnosis Date  . Alcohol abuse   . Pancreatitis     CT findings in May 2011 with inflammation and pseudocyst  . GERD (gastroesophageal reflux disease)   . CAD (coronary artery disease) 06/13/2012    Calcification noted on CTA of chest in 2012 Wall motion abnormality on ECHO    . 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 Hyperlipidemia   . Headache(784.0)     migraine  . Heart disease   . Depression   . Anxiety   . Hypertension   . Carotid artery disease     Procedure(s): Carotid PTA/Stent Intervention  Discharged Condition: good  HPI:  High grade left ICA stenosis, right ICA occlusion, CAD, previous history of stroke  Hospital Course:  Brandon SmilingKeith W Bodi is a 49 y.o. male is S/P left Procedure(s): Carotid PTA/Stent Intervention  Physical exam: AF/VSS, neuro exam stable from baseline.   Post-op wounds access site C/d/i Pt. Ambulating, voiding and taking PO diet without difficulty. Pt pain controlled with PO pain meds. Labs as below Complications:none  Consults:     Significant Diagnostic Studies: CBC Lab Results  Component Value Date   WBC 7.2 04/07/2015   HGB 12.6* 04/07/2015   HCT 37.7* 04/07/2015   MCV 101.4* 04/07/2015   PLT 157 04/07/2015    BMET    Component Value Date/Time   NA 140 04/07/2015 0431   K 3.6 04/07/2015 0431   CL 106 04/07/2015 0431   CO2 28 04/07/2015 0431   GLUCOSE 91 04/07/2015 0431   BUN 9 04/07/2015 0431   CREATININE 0.55* 04/07/2015 0431   CREATININE 0.94 02/12/2012 1419   CALCIUM  8.7* 04/07/2015 0431   GFRNONAA >60 04/07/2015 0431   GFRNONAA >89 02/12/2012 1419   GFRAA >60 04/07/2015 0431   GFRAA >89 02/12/2012 1419   COAG Lab Results  Component Value Date   INR 0.99 06/11/2012     Disposition:  Discharge to :home    Medication List    ASK your doctor about these medications        albuterol (2.5 MG/3ML) 0.083% nebulizer solution  Commonly known as:  PROVENTIL  Take 2.5 mg by nebulization as needed for wheezing or shortness of breath.     amLODipine 10 MG tablet  Commonly known as:  NORVASC  Take 1 tablet (10 mg total) by mouth once.     aspirin 81 MG tablet  Take 81 mg by mouth daily.     buPROPion 150 MG 12 hr tablet  Commonly known as:  WELLBUTRIN SR  Start 1 in the am for 3 days then 1 tab twice daily.     clopidogrel 75 MG tablet  Commonly known as:  PLAVIX  Take 1 tablet (75 mg total) by mouth daily with breakfast.     cyclobenzaprine 10 MG tablet  Commonly known as:  FLEXERIL  Take 1 tablet (10 mg total) by mouth 3 (three) times daily as needed for muscle spasms.     famotidine 20 MG tablet  Commonly known as:  PEPCID  Take 1 tablet (20  mg total) by mouth 2 (two) times daily as needed for heartburn.     FISH OIL PO  Take 1 capsule by mouth daily.     folic acid 1 MG tablet  Commonly known as:  FOLVITE  Take 1 mg by mouth daily.     GARLIC OIL 1500 PO  Take by mouth daily.     hydrochlorothiazide 12.5 MG capsule  Commonly known as:  MICROZIDE  Take 12.5 mg by mouth daily.     lubiprostone 24 MCG capsule  Commonly known as:  AMITIZA  Take 24 mcg by mouth as needed for constipation.     metoprolol 50 MG tablet  Commonly known as:  LOPRESSOR  Take 50 mg by mouth daily.     nicotine 21 mg/24hr patch  Commonly known as:  NICODERM CQ - dosed in mg/24 hours  Place 1 patch (21 mg total) onto the skin daily.     ondansetron 4 MG tablet  Commonly known as:  ZOFRAN  Take 1 tablet (4 mg total) by mouth every 6 (six) hours.      oxyCODONE-acetaminophen 5-325 MG per tablet  Commonly known as:  PERCOCET/ROXICET  Take by mouth every 4 (four) hours as needed for severe pain.     simvastatin 20 MG tablet  Commonly known as:  ZOCOR  Take 20 mg by mouth daily.     SPIRIVA HANDIHALER 18 MCG inhalation capsule  Generic drug:  tiotropium  Place into inhaler and inhale.     traMADol 50 MG tablet  Commonly known as:  ULTRAM  TAKE 1 TO 2 TABLETS BY MOUTH EVERY 6 HOURS AS NEEDED FOR PAIN     traZODone 150 MG tablet  Commonly known as:  DESYREL  Take 1 tablet (150 mg total) by mouth at bedtime.     VITAMIN-B COMPLEX PO  Take 800 mg by mouth daily.     ZENPEP 25000 UNITS Cpep  Generic drug:  Pancrelipase (Lip-Prot-Amyl)  Take 1-2 capsules by mouth 3 (three) times daily with meals.       Verbal and written Discharge instructions given to the patient. Wound care per Discharge AVS     Follow-up Information    Follow up with DEW,JASON, MD In 4 weeks.   Specialties:  Vascular Surgery, Radiology, Interventional Cardiology   Why:  with carotid duplex and see me or PA   Contact information:   [address] [place] [address] [phone]       Signed: Festus Barren, MD  04/07/2015, 8:32 AM

## 2015-04-07 NOTE — Progress Notes (Signed)
Both IVs removed. Discharge instrustions reviewed with pt and wife. AVS printed and signed. Pt copy given. Wheeled out via wheelchair by axillary. Derald MacleodKillingsworth,Shalece Staffa, RN

## 2015-04-07 NOTE — Progress Notes (Signed)
°   04/07/15 0900  Clinical Encounter Type  Visited With Patient and family together  Visit Type Initial (Pat. desired info concerning Adv. Dir.)  Merchant navy officerAdvance Directives (For Healthcare)  Would patient like information on creating an advanced directive? (Patient was provided Adv. Dir. Infor. would consider later)

## 2015-04-12 ENCOUNTER — Ambulatory Visit (INDEPENDENT_AMBULATORY_CARE_PROVIDER_SITE_OTHER): Payer: Medicaid Other | Admitting: Neurology

## 2015-04-12 ENCOUNTER — Encounter: Payer: Self-pay | Admitting: Neurology

## 2015-04-12 VITALS — BP 104/71 | HR 69 | Ht 73.0 in | Wt 205.0 lb

## 2015-04-12 DIAGNOSIS — I6522 Occlusion and stenosis of left carotid artery: Secondary | ICD-10-CM

## 2015-04-12 NOTE — Progress Notes (Signed)
PATIENT: Brandon [name] DOB: [birth year]  REASON FOR VISIT: routine follow up for stroke HISTORY FROM: patient  HISTORY OF PRESENT ILLNESS: 09/24/12 (PS): Brandon [name] is a 49 year old Caucasian male seen for followup after hospital admission for stroke on 06/10/2012. He presented with sudden onset of left facial droop and slurred speech and left arm and left numbness. He was given IV TPA without complications. He has showed significant improvement except for persistent left-sided numbness. MRI scan of the brain showed only small right parietal and insular cortex infarct. Carotid ultrasound showed heavily calcified right internal carotid artery with occlusion. Left side showed moderate calcification without significant stenosis. CT angiogram showed near occlusion of the right proximal ICA with trickle flow beyond. 2-D echo showed unusual wall motion abnormalities with hypokinesis of the anterior septum. Ejection fraction of 55%. Nuclear cardiac scan showed minimum hypokinesia involving the inferior wall and septum with 51% ejection fraction and no cardiac source of embolism. Patient had cerebral catheter angiogram on 06/15/2012 by Dr. 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 Patient was found to have hyperlipidemia, hypertension and smoking as vascular factors. He was started on aspirin and Plavix and states he has done well. His speech difficulties have improved and facial droop as well. He still has some mild paresthesias in the left hand. 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 He has applied for Medicaid and application is pending. He has not been doing outpatient physical and occupational therapy as he has no  insurance. He has cut back smoking but not completely quit yet. His blood pressure today is 132/96.  10/20/13 (LL): Patient returns for 1 year follow up. Since last visit 1 year ago he has had a moderate recovery but was not able to return to work due to easy fatigueability and cognitive difficulties. Repeat CT angiogram Head and Neck showed no change in 50 % stenosis of the left ICA in the bulb region with occlusion of the right vertebral artery. Complete occlusion of the right ICA at the bulb. Reconstituted flow in the right ICA at the skull base. Flow in both distal vertebral arteries with the right being quite small. No basilar stenosis. No posterior circulation branch vessel stenosis or occlusion. He continues to smoke and reports that he has tried many times to quit. He tried Chantix but due to mood changes and agitation had to stop. He complains of near every day headaches that are incapacitating. They are described as "all-over", not unilateral, pounding in quality with sharp shooting pains coming up from the occipital area. He has tried tramadol for these headaches, with some relief but he takes them more frequently than ordered which has led to stomach irritation.  12/02/13 (LL): Patient returns for follow up. His MRI brain shows Mild diffuse and mild-moderate perisylvian atrophy, moderate ventriculomegaly. MRI cervical spine revealed mild stenosis and disc bulging at several levels. His carotid doppler study was unchanged from previous. Since last visit his headaches have continued, essentially unchanged. His wife states he has mood swings, is easily agitated. He is still smoking. He has not gotten any relief from headaches from Tizanidine, it only makes him tired. He has used a family member's Flexeril, which worked  better, and helped him rest.   Update 06/09/14 (LL): Brandon [name] returns to the office for stroke followup. Since last visit, there has not been much change per patient and family member.  He continues to have headaches and neck pain.  He states his PCP Brandon [name] can no longer prescribe narcotics and he was referred to a pain clinic and put on Suboxone. He continues to smoke and drink alcohol despite having acute on chronic pancreatitis with multiple pseudocysts, with a flareup in May.  He does not sleep well.  He is requesting Percocet today. Blood pressure is well controlled, it is 133/78 in the office today. He is tolerating Plavix well with no signs of significant bleeding or bruising. He was not able to tolerate Depakote ER prescribed at last visit, stating it made him feel "like I was someone else," and quit taking it after 3 days. Update 04/12/2015 : He returns for follow-up after last visit 10 months ago. Is a complaint by his wife. Patient had elective left carotid stent done by Dr. Festus Barren in [place] on 04/06/15 and the procedure went well and in fact the patient has noticed improvement in his mental clarity, he states "" my brain fog has lifted and I can think more clearly.`` He has also noticed improvement in headaches and overall he feels better. He had also recently had a coronary stent by Dr. Welton Flakes in [place] He has had no recurrent stroke or TIA symptoms. He continues to smoke but plans to cut back and smokes now only half pack per day. He states his blood pressure is well controlled and today it is 104/71 in office. He has not had any recent lipid profile checked. She is tolerating aspirin and Plavix without bleeding, bruising or other side effects. REVIEW OF SYSTEMS: Full 14 system review of systems performed and notable only for:  Fatigue, headache, mental 4, memory loss, numbness in the hand, recent carotid stent and all other systems negative  ALLERGIES: Allergies  Allergen Reactions  . No Known Allergies     HOME MEDICATIONS: Outpatient Prescriptions Prior to Visit  Medication Sig Dispense Refill  . albuterol (PROVENTIL) (2.5 MG/3ML) 0.083% nebulizer solution  Take 2.5 mg by nebulization as needed for wheezing or shortness of breath.    Marland Kitchen amLODipine (NORVASC) 10 MG tablet Take 1 tablet (10 mg total) by mouth once. (Patient not taking: Reported on 04/06/2015) 30 tablet 2  . aspirin 81 MG tablet Take 81 mg by mouth daily.      . B Complex Vitamins (VITAMIN-B COMPLEX PO) Take 800 mg by mouth daily.    Marland Kitchen buPROPion (WELLBUTRIN SR) 150 MG 12 hr tablet Start 1 in the am for 3 days then 1 tab twice daily. (Patient not taking: Reported on 03/15/2015) 60 tablet 5  . clopidogrel (PLAVIX) 75 MG tablet Take 1 tablet (75 mg total) by mouth daily with breakfast. 30 tablet 2  . cyclobenzaprine (FLEXERIL) 10 MG tablet Take 1 tablet (10 mg total) by mouth 3 (three) times daily as needed for muscle spasms. 90 tablet 5  . famotidine (PEPCID) 20 MG tablet Take 1 tablet (20 mg total) by mouth 2 (two) times daily as needed for heartburn. 180 tablet 6  . folic acid (FOLVITE) 1 MG tablet Take 1 mg by mouth daily.      Marland Kitchen GARLIC OIL 1500 PO Take by mouth daily.    . hydrochlorothiazide (MICROZIDE) 12.5 MG capsule Take 12.5 mg by mouth daily.    Marland Kitchen  lubiprostone (AMITIZA) 24 MCG capsule Take 24 mcg by mouth as needed for constipation.    . metoprolol (LOPRESSOR) 50 MG tablet Take 50 mg by mouth daily.    . nicotine (NICODERM CQ - DOSED IN MG/24 HOURS) 21 mg/24hr patch Place 1 patch (21 mg total) onto the skin daily. 28 patch 0  . Omega-3 Fatty Acids (FISH OIL PO) Take 1 capsule by mouth daily.    . ondansetron (ZOFRAN) 4 MG tablet Take 1 tablet (4 mg total) by mouth every 6 (six) hours. (Patient taking differently: Take 4 mg by mouth every 6 (six) hours as needed for nausea. ) 15 tablet 0  . oxyCODONE-acetaminophen (PERCOCET/ROXICET) 5-325 MG per tablet Take by mouth every 4 (four) hours as needed for severe pain.    . Pancrelipase, Lip-Prot-Amyl, (ZENPEP) 25000 UNITS CPEP Take 1-2 capsules by mouth 3 (three) times daily with meals.     . simvastatin (ZOCOR) 20 MG tablet Take 20 mg by  mouth daily.    Marland Kitchen tiotropium (SPIRIVA HANDIHALER) 18 MCG inhalation capsule Place into inhaler and inhale.    . traMADol (ULTRAM) 50 MG tablet TAKE 1 TO 2 TABLETS BY MOUTH EVERY 6 HOURS AS NEEDED FOR PAIN 90 tablet 5  . traZODone (DESYREL) 150 MG tablet Take 1 tablet (150 mg total) by mouth at bedtime. 30 tablet 1   No facility-administered medications prior to visit.   Meds ordered this encounter  Medications  . tiotropium (SPIRIVA HANDIHALER) 18 MCG inhalation capsule    Sig: Place into inhaler and inhale.  . Buprenorphine HCl-Naloxone HCl (SUBOXONE) 4-1 MG FILM    Sig: Place under the tongue as needed.    PHYSICAL EXAM Filed Vitals:   04/12/15 1415  BP: 104/71  Pulse: 69  Height:  (1.854 m)  Weight: 205 lb (92.987 kg)   Body mass index is 27.05 kg/(m^2).  Generalized: Well developed, in no acute distress,   Head: normocephalic and atraumatic. Oropharynx benign  Neck: Supple, no carotid bruits  Cardiac: Regular rate rhythm, no murmur  Musculoskeletal: No deformity   Neurological examination  Mentation: Alert oriented to time, place, history taking. Follows all commands speech and language fluent  Cranial nerve II-XII: Pupils were equal round reactive to light extraocular movements were full, visual field were full on confrontational test. Facial sensation and strength were normal. hearing was intact to finger rubbing bilaterally. Uvula tongue midline. head turning and shoulder shrug and were normal and symmetric.Tongue protrusion into cheek strength was normal.  Motor: normal bulk and tone, full strength in the BUE, BLE, no pronator drift. Diminished fine finger movements on the left. Minimum left grip weakness. Orbits right over left approximately.  Sensory: normal and symmetric to light touch, pinprick, and vibration, except slight decrease in left upper extremity.  Coordination: finger-nose-finger, heel-to-shin bilaterally, no dysmetria  Gait and Station: Rising up from  seated position without assistance, normal stance, without trunk ataxia, moderate stride, good arm swing, smooth turning, able to perform tiptoe, heel walking without difficulty. Tandem unsteady.  Reflexes: Deep tendon reflexes in the upper and lower extremities are present and symmetric.   DIAGNOSTIC DATA (LABS, IMAGING, TESTING) - I reviewed patient records, labs, notes, testing and imaging myself where available.  - I reviewed patient records, labs, notes, testing and imaging myself where available.  11/04/13 Abnormal MRI cervical spine (without) demonstrating:  1. At C3-4. C4-5, C5-6: disc bulging and uncovertebral joint hypertrophy with mild spinal stenosis and severe biforaminal foraminal stenosis; no cord signal  abnormalities  2. At T2-3: disc bulging with mild right and moderate left foraminal stenosis   Abnormal MRI brain (without) demonstrating:  1. Mild diffuse and mild-moderate perisylvian atrophy. Moderate ventriculomegaly.  2. Mild chronic small vessel ischemic disease.  3. No acute findings.  4. No new findings compared to MRI on 06/11/12.   ASSESSMENT: Brandon [name] is a 49 year old Caucasian male with a right insular and frontal cortex infarct secondary to embolization from proximal right internal carotid artery dissection with occlusion in the neck with moderate left proximal internal carotid artery focal dissection as well as chronically occluded right vertebral artery. Vascular risk factors of hypertension, cardiac disease, hyperlipidemia and smoking. 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 Recent left carotid stent went well.Marland Kitchen   PLAN: I had a long d/w patient and his wife about his remote stroke,recent left ICA PTA/stenting risk for recurrent stroke/TIAs, personally independently reviewed imaging studies and stroke evaluation results and answered questions.Continue aspirin 81 mg orally every day and clopidogrel 75 mg orally every day  for secondary  stroke prevention and maintain strict control of hypertension with blood pressure goal below 130/90, diabetes with hemoglobin A1c goal below 6.5% and lipids with LDL cholesterol goal below 100 mg/dL. I also advised the patient to eat a healthy diet with plenty of whole grains, cereals, fruits and vegetables, exercise regularly and maintain ideal body weight .I counseled the patient to quit smoking completely. Followup in the future with me in  1 year or call earlier if necessary.     Return in about 1 year (around 04/11/2016).  Delia Heady, MD  04/12/2015, 2:48 PM [facility] [address], [address] [place], [place] [address] [phone]  Note: This document was prepared with digital dictation and possible smart phrase technology. Any transcriptional errors that result from this process are unintentional.

## 2015-04-12 NOTE — Patient Instructions (Signed)
I had a long d/w patient and his wife about his remote stroke,recent left ICA PTA/stenting risk for recurrent stroke/TIAs, personally independently reviewed imaging studies and stroke evaluation results and answered questions.Continue aspirin 81 mg orally every day and clopidogrel 75 mg orally every day  for secondary stroke prevention and maintain strict control of hypertension with blood pressure goal below 130/90, diabetes with hemoglobin A1c goal below 6.5% and lipids with LDL cholesterol goal below 100 mg/dL. I also advised the patient to eat a healthy diet with plenty of whole grains, cereals, fruits and vegetables, exercise regularly and maintain ideal body weight .I counseled the patient to quit smoking completely. Followup in the future with me in  1 year or call earlier if necessary.

## 2015-04-27 ENCOUNTER — Other Ambulatory Visit: Payer: Self-pay | Admitting: Neurology

## 2015-05-31 ENCOUNTER — Telehealth: Payer: Self-pay | Admitting: Gastroenterology

## 2015-05-31 NOTE — Telephone Encounter (Signed)
Left voice message for patient to call and schedule appointment for pancreatitis. Notes unde Media

## 2015-06-01 ENCOUNTER — Ambulatory Visit (INDEPENDENT_AMBULATORY_CARE_PROVIDER_SITE_OTHER): Payer: Medicaid Other | Admitting: Podiatry

## 2015-06-01 ENCOUNTER — Ambulatory Visit (INDEPENDENT_AMBULATORY_CARE_PROVIDER_SITE_OTHER): Payer: Medicaid Other

## 2015-06-01 ENCOUNTER — Encounter: Payer: Self-pay | Admitting: Podiatry

## 2015-06-01 ENCOUNTER — Ambulatory Visit: Payer: Medicaid Other

## 2015-06-01 VITALS — BP 148/100 | HR 71 | Resp 18

## 2015-06-01 DIAGNOSIS — R52 Pain, unspecified: Secondary | ICD-10-CM

## 2015-06-01 DIAGNOSIS — M7750 Other enthesopathy of unspecified foot: Secondary | ICD-10-CM

## 2015-06-01 DIAGNOSIS — M216X9 Other acquired deformities of unspecified foot: Secondary | ICD-10-CM | POA: Diagnosis not present

## 2015-06-01 DIAGNOSIS — M2010 Hallux valgus (acquired), unspecified foot: Secondary | ICD-10-CM | POA: Diagnosis not present

## 2015-06-01 DIAGNOSIS — L84 Corns and callosities: Secondary | ICD-10-CM

## 2015-06-01 DIAGNOSIS — M7740 Metatarsalgia, unspecified foot: Secondary | ICD-10-CM

## 2015-06-01 NOTE — Progress Notes (Signed)
   Subjective:    Patient ID: Brandon [name], male    DOB: 12-Jul-1966, 49 y.o.   MRN: [id number]  HPI  49 year old male presents the office with concerns of pain in the ball of his feet which is been ongoing for several years. He states he has pain when he puts pressure to his feet when weightbearing. He has no pain at rest. This is been ongoing for greater than 1 year and is been consistent. Denies any history of injury or trauma. No swelling or redness. No tingling or numbness. No history of injury or trauma. His wife does try to him the calluses on his feet however the do continue to recur. He states that the he feels he is walking on levels. No other complaints at this time.  Review of Systems  Constitutional: Positive for appetite change and fatigue.  Genitourinary: Positive for frequency.  Musculoskeletal: Positive for back pain and gait problem.  All other systems reviewed and are negative.      Objective:   Physical Exam AAO x3, NAD DP/PT pulses palpable bilaterally, CRT less than 3 seconds Protective sensation intact with Simms Weinstein monofilament, vibratory sensation intact, Achilles tendon reflex intact There is prominence the metatarsal heads plantarly with atrophy of the fat pad. There is submetatarsal one and 5 hyperkeratotic lesions bilaterally. Upon debridement there was no underlying ulceration, drainage or other signs of infection. There is tenderness diffusely overlying metatarsal heads plantarly here there is no specific area pinpoint bony tenderness or pain the vibratory sensation. No areas of tenderness to bilateral lower extremities. MMT 5/5, ROM WNL.  No open lesions or pre-ulcerative lesions.  No overlying edema, erythema, increase in warmth to bilateral lower extremities.  No pain with calf compression, swelling, warmth, erythema bilaterally.      Assessment & Plan:  49 year old male with metatarsalgia due to prominent metatarsal heads and hyperkeratotic  lesions -Treatment options discussed including all alternatives, risks, and complications -X-rays were obtained and reviewed with the patient.  -Discussed etiology of his symptoms. -Hyperkeratotic lesion sharply debrided 4 without complication/bleeding -Metatarsal pads dispensed. -Follow-up in 4 weeks or sooner if any problems arise. In the meantime, encouraged to call the office with any questions, concerns, change in symptoms.   Ovid Curd, DPM

## 2015-06-02 ENCOUNTER — Encounter: Payer: Self-pay | Admitting: Podiatry

## 2015-06-22 ENCOUNTER — Ambulatory Visit: Payer: Medicaid Other | Admitting: Podiatry

## 2015-07-04 ENCOUNTER — Ambulatory Visit: Payer: Medicaid Other | Admitting: Sports Medicine

## 2015-09-20 ENCOUNTER — Telehealth: Payer: Self-pay | Admitting: Neurology

## 2015-09-20 MED ORDER — CYCLOBENZAPRINE HCL 10 MG PO TABS: 10.0000 mg | ORAL_TABLET | Freq: Three times a day (TID) | ORAL | Status: DC | PRN

## 2015-09-20 NOTE — Telephone Encounter (Signed)
Patient called to request renewal of cyclobenzaprine (FLEXERIL) 10 MG tablet, prescription has expired, needs a refill.

## 2015-11-09 ENCOUNTER — Other Ambulatory Visit: Payer: Self-pay | Admitting: Neurology

## 2015-11-14 ENCOUNTER — Other Ambulatory Visit: Payer: Self-pay

## 2015-11-14 MED ORDER — TRAMADOL HCL 50 MG PO TABS: 50.0000 mg | ORAL_TABLET | Freq: Four times a day (QID) | ORAL | Status: DC | PRN

## 2015-11-14 NOTE — Telephone Encounter (Signed)
Message sent to Dr.Willis about doing 30 day refill for patient. He is the covering md for [facility]. Dr.Sethi is out of town.Dr.Sethi can do future refills for the patient.

## 2015-11-15 NOTE — Telephone Encounter (Signed)
Tramadol prescription fax to patients pharmacy at [facility] in [place] at  [phone].Fax confirm and receive.

## 2016-01-15 ENCOUNTER — Encounter: Payer: Self-pay | Admitting: Gastroenterology

## 2016-03-04 ENCOUNTER — Other Ambulatory Visit: Payer: Self-pay | Admitting: Neurology

## 2016-03-04 NOTE — Telephone Encounter (Signed)
Pt has appt with Dr. Pearlean BrownieSethi 04-11-16

## 2016-03-04 NOTE — Telephone Encounter (Signed)
This pt of Dr. Pearlean BrownieSethi.  Please advise on tramadol refill.  Pt has yrly appt with you on 04-11-16.

## 2016-03-05 ENCOUNTER — Inpatient Hospital Stay (HOSPITAL_COMMUNITY)
Admission: EM | Admit: 2016-03-05 | Discharge: 2016-03-12 | DRG: 064 | Disposition: A | Discharge status: Rehabilitation Facility | Payer: Medicaid Other | Attending: Internal Medicine | Admitting: Internal Medicine

## 2016-03-05 ENCOUNTER — Emergency Department (HOSPITAL_COMMUNITY): Payer: Medicaid Other

## 2016-03-05 ENCOUNTER — Encounter (HOSPITAL_COMMUNITY): Payer: Self-pay | Admitting: Vascular Surgery

## 2016-03-05 DIAGNOSIS — N390 Urinary tract infection, site not specified: Secondary | ICD-10-CM | POA: Diagnosis present

## 2016-03-05 DIAGNOSIS — E119 Type 2 diabetes mellitus without complications: Secondary | ICD-10-CM | POA: Insufficient documentation

## 2016-03-05 DIAGNOSIS — Z79899 Other long term (current) drug therapy: Secondary | ICD-10-CM

## 2016-03-05 DIAGNOSIS — Z7982 Long term (current) use of aspirin: Secondary | ICD-10-CM | POA: Diagnosis not present

## 2016-03-05 DIAGNOSIS — F1010 Alcohol abuse, uncomplicated: Secondary | ICD-10-CM | POA: Diagnosis present

## 2016-03-05 DIAGNOSIS — E876 Hypokalemia: Secondary | ICD-10-CM | POA: Diagnosis present

## 2016-03-05 DIAGNOSIS — K859 Acute pancreatitis without necrosis or infection, unspecified: Secondary | ICD-10-CM | POA: Diagnosis present

## 2016-03-05 DIAGNOSIS — L8990 Pressure ulcer of unspecified site, unspecified stage: Secondary | ICD-10-CM | POA: Insufficient documentation

## 2016-03-05 DIAGNOSIS — K766 Portal hypertension: Secondary | ICD-10-CM | POA: Diagnosis present

## 2016-03-05 DIAGNOSIS — R414 Neurologic neglect syndrome: Secondary | ICD-10-CM | POA: Diagnosis present

## 2016-03-05 DIAGNOSIS — E1165 Type 2 diabetes mellitus with hyperglycemia: Secondary | ICD-10-CM | POA: Diagnosis present

## 2016-03-05 DIAGNOSIS — E785 Hyperlipidemia, unspecified: Secondary | ICD-10-CM | POA: Diagnosis present

## 2016-03-05 DIAGNOSIS — I638 Other cerebral infarction: Secondary | ICD-10-CM | POA: Diagnosis not present

## 2016-03-05 DIAGNOSIS — K700 Alcoholic fatty liver: Secondary | ICD-10-CM | POA: Diagnosis present

## 2016-03-05 DIAGNOSIS — D62 Acute posthemorrhagic anemia: Secondary | ICD-10-CM | POA: Diagnosis present

## 2016-03-05 DIAGNOSIS — F419 Anxiety disorder, unspecified: Secondary | ICD-10-CM | POA: Diagnosis present

## 2016-03-05 DIAGNOSIS — B961 Klebsiella pneumoniae [K. pneumoniae] as the cause of diseases classified elsewhere: Secondary | ICD-10-CM | POA: Diagnosis present

## 2016-03-05 DIAGNOSIS — F329 Major depressive disorder, single episode, unspecified: Secondary | ICD-10-CM | POA: Diagnosis present

## 2016-03-05 DIAGNOSIS — F17210 Nicotine dependence, cigarettes, uncomplicated: Secondary | ICD-10-CM | POA: Diagnosis present

## 2016-03-05 DIAGNOSIS — I6521 Occlusion and stenosis of right carotid artery: Secondary | ICD-10-CM | POA: Diagnosis present

## 2016-03-05 DIAGNOSIS — I864 Gastric varices: Secondary | ICD-10-CM | POA: Diagnosis present

## 2016-03-05 DIAGNOSIS — Z720 Tobacco use: Secondary | ICD-10-CM | POA: Insufficient documentation

## 2016-03-05 DIAGNOSIS — Z8249 Family history of ischemic heart disease and other diseases of the circulatory system: Secondary | ICD-10-CM | POA: Diagnosis not present

## 2016-03-05 DIAGNOSIS — K760 Fatty (change of) liver, not elsewhere classified: Secondary | ICD-10-CM | POA: Insufficient documentation

## 2016-03-05 DIAGNOSIS — D649 Anemia, unspecified: Secondary | ICD-10-CM

## 2016-03-05 DIAGNOSIS — I25110 Atherosclerotic heart disease of native coronary artery with unstable angina pectoris: Secondary | ICD-10-CM | POA: Diagnosis present

## 2016-03-05 DIAGNOSIS — H04123 Dry eye syndrome of bilateral lacrimal glands: Secondary | ICD-10-CM | POA: Diagnosis not present

## 2016-03-05 DIAGNOSIS — K860 Alcohol-induced chronic pancreatitis: Secondary | ICD-10-CM | POA: Insufficient documentation

## 2016-03-05 DIAGNOSIS — G9340 Encephalopathy, unspecified: Secondary | ICD-10-CM | POA: Diagnosis present

## 2016-03-05 DIAGNOSIS — I6930 Unspecified sequelae of cerebral infarction: Secondary | ICD-10-CM | POA: Insufficient documentation

## 2016-03-05 DIAGNOSIS — E871 Hypo-osmolality and hyponatremia: Secondary | ICD-10-CM | POA: Diagnosis present

## 2016-03-05 DIAGNOSIS — I10 Essential (primary) hypertension: Secondary | ICD-10-CM | POA: Diagnosis not present

## 2016-03-05 DIAGNOSIS — I63511 Cerebral infarction due to unspecified occlusion or stenosis of right middle cerebral artery: Secondary | ICD-10-CM | POA: Diagnosis not present

## 2016-03-05 DIAGNOSIS — K863 Pseudocyst of pancreas: Secondary | ICD-10-CM | POA: Diagnosis present

## 2016-03-05 DIAGNOSIS — Z823 Family history of stroke: Secondary | ICD-10-CM | POA: Diagnosis not present

## 2016-03-05 DIAGNOSIS — K922 Gastrointestinal hemorrhage, unspecified: Secondary | ICD-10-CM | POA: Diagnosis not present

## 2016-03-05 DIAGNOSIS — I119 Hypertensive heart disease without heart failure: Secondary | ICD-10-CM | POA: Diagnosis present

## 2016-03-05 DIAGNOSIS — I2510 Atherosclerotic heart disease of native coronary artery without angina pectoris: Secondary | ICD-10-CM | POA: Diagnosis present

## 2016-03-05 DIAGNOSIS — N179 Acute kidney failure, unspecified: Secondary | ICD-10-CM | POA: Diagnosis present

## 2016-03-05 DIAGNOSIS — R740 Nonspecific elevation of levels of transaminase and lactic acid dehydrogenase [LDH]: Secondary | ICD-10-CM | POA: Diagnosis not present

## 2016-03-05 DIAGNOSIS — K7011 Alcoholic hepatitis with ascites: Secondary | ICD-10-CM | POA: Diagnosis present

## 2016-03-05 DIAGNOSIS — Z955 Presence of coronary angioplasty implant and graft: Secondary | ICD-10-CM

## 2016-03-05 DIAGNOSIS — D735 Infarction of spleen: Secondary | ICD-10-CM | POA: Diagnosis present

## 2016-03-05 DIAGNOSIS — F418 Other specified anxiety disorders: Secondary | ICD-10-CM | POA: Diagnosis present

## 2016-03-05 DIAGNOSIS — Z833 Family history of diabetes mellitus: Secondary | ICD-10-CM

## 2016-03-05 DIAGNOSIS — G479 Sleep disorder, unspecified: Secondary | ICD-10-CM | POA: Diagnosis not present

## 2016-03-05 DIAGNOSIS — I639 Cerebral infarction, unspecified: Secondary | ICD-10-CM | POA: Diagnosis not present

## 2016-03-05 DIAGNOSIS — G8194 Hemiplegia, unspecified affecting left nondominant side: Secondary | ICD-10-CM | POA: Diagnosis present

## 2016-03-05 DIAGNOSIS — Z7902 Long term (current) use of antithrombotics/antiplatelets: Secondary | ICD-10-CM

## 2016-03-05 DIAGNOSIS — I63031 Cerebral infarction due to thrombosis of right carotid artery: Secondary | ICD-10-CM | POA: Diagnosis not present

## 2016-03-05 DIAGNOSIS — R000 Tachycardia, unspecified: Secondary | ICD-10-CM | POA: Insufficient documentation

## 2016-03-05 DIAGNOSIS — K219 Gastro-esophageal reflux disease without esophagitis: Secondary | ICD-10-CM | POA: Diagnosis present

## 2016-03-05 DIAGNOSIS — I82890 Acute embolism and thrombosis of other specified veins: Secondary | ICD-10-CM | POA: Diagnosis present

## 2016-03-05 DIAGNOSIS — F1020 Alcohol dependence, uncomplicated: Secondary | ICD-10-CM | POA: Diagnosis present

## 2016-03-05 DIAGNOSIS — R4182 Altered mental status, unspecified: Secondary | ICD-10-CM | POA: Diagnosis not present

## 2016-03-05 DIAGNOSIS — K861 Other chronic pancreatitis: Secondary | ICD-10-CM | POA: Diagnosis not present

## 2016-03-05 DIAGNOSIS — K7200 Acute and subacute hepatic failure without coma: Secondary | ICD-10-CM | POA: Diagnosis present

## 2016-03-05 DIAGNOSIS — K921 Melena: Secondary | ICD-10-CM | POA: Diagnosis not present

## 2016-03-05 HISTORY — DX: Atherosclerosis of renal artery: I70.1

## 2016-03-05 HISTORY — DX: Alcohol dependence, uncomplicated: F10.20

## 2016-03-05 HISTORY — DX: Other specified anxiety disorders: F41.8

## 2016-03-05 LAB — CBC WITH DIFFERENTIAL/PLATELET
Basophils Absolute: 0 10*3/uL (ref 0.0–0.1)
Basophils Relative: 0 %
Eosinophils Absolute: 0 10*3/uL (ref 0.0–0.7)
Eosinophils Relative: 0 %
HCT: 14.8 % — ABNORMAL LOW (ref 39.0–52.0)
Hemoglobin: 4.8 g/dL — CL (ref 13.0–17.0)
Lymphocytes Relative: 6 %
Lymphs Abs: 0.9 10*3/uL (ref 0.7–4.0)
MCH: 30 pg (ref 26.0–34.0)
MCHC: 32.4 g/dL (ref 30.0–36.0)
MCV: 92.5 fL (ref 78.0–100.0)
Monocytes Absolute: 0.8 10*3/uL (ref 0.1–1.0)
Monocytes Relative: 6 %
Neutro Abs: 11.5 10*3/uL — ABNORMAL HIGH (ref 1.7–7.7)
Neutrophils Relative %: 87 %
Platelets: 192 10*3/uL (ref 150–400)
RBC: 1.6 MIL/uL — ABNORMAL LOW (ref 4.22–5.81)
RDW: 17.4 % — ABNORMAL HIGH (ref 11.5–15.5)
WBC: 13.2 10*3/uL — ABNORMAL HIGH (ref 4.0–10.5)

## 2016-03-05 LAB — URINALYSIS, ROUTINE W REFLEX MICROSCOPIC
Bilirubin Urine: NEGATIVE
Glucose, UA: 250 mg/dL — AB
Hgb urine dipstick: NEGATIVE
Ketones, ur: 15 mg/dL — AB
Leukocytes, UA: NEGATIVE
Nitrite: NEGATIVE
Protein, ur: NEGATIVE mg/dL
Specific Gravity, Urine: 1.017 (ref 1.005–1.030)
pH: 5.5 (ref 5.0–8.0)

## 2016-03-05 LAB — LACTIC ACID, PLASMA
Lactic Acid, Venous: 1.6 mmol/L (ref 0.5–2.0)
Lactic Acid, Venous: 1.5 mmol/L (ref 0.5–2.0)

## 2016-03-05 LAB — TROPONIN I: Troponin I: 0.07 ng/mL — ABNORMAL HIGH (ref <0.031)

## 2016-03-05 LAB — CK: Total CK: 511 U/L — ABNORMAL HIGH (ref 49–397)

## 2016-03-05 LAB — BASIC METABOLIC PANEL
Anion gap: 12 (ref 5–15)
BUN: 66 mg/dL — ABNORMAL HIGH (ref 6–20)
CO2: 23 mmol/L (ref 22–32)
Calcium: 8.5 mg/dL — ABNORMAL LOW (ref 8.9–10.3)
Chloride: 92 mmol/L — ABNORMAL LOW (ref 101–111)
Creatinine, Ser: 1.19 mg/dL (ref 0.61–1.24)
GFR calc Af Amer: >60 mL/min (ref >60)
GFR calc non Af Amer: >60 mL/min (ref >60)
Glucose, Bld: 223 mg/dL — ABNORMAL HIGH (ref 65–99)
Potassium: 3.5 mmol/L (ref 3.5–5.1)
Sodium: 127 mmol/L — ABNORMAL LOW (ref 135–145)

## 2016-03-05 LAB — POC OCCULT BLOOD, ED: Fecal Occult Bld: POSITIVE — AB

## 2016-03-05 LAB — MAGNESIUM: Magnesium: 2.3 mg/dL (ref 1.7–2.4)

## 2016-03-05 LAB — PREPARE RBC (CROSSMATCH)

## 2016-03-05 LAB — ABO/RH: ABO/RH(D): O POS

## 2016-03-05 MED ORDER — ONDANSETRON HCL 4 MG/2ML IJ SOLN: 4.0000 mg | Freq: Four times a day (QID) | INTRAMUSCULAR | Status: DC | PRN

## 2016-03-05 MED ORDER — SODIUM CHLORIDE 0.9 % IV SOLN
80.0000 mg | Freq: Once | INTRAVENOUS | Status: AC
  Administered 2016-03-05: 80 mg via INTRAVENOUS
  Filled 2016-03-05: qty 80

## 2016-03-05 MED ORDER — MORPHINE SULFATE (PF) 2 MG/ML IV SOLN
1.0000 mg | INTRAVENOUS | Status: DC | PRN
  Administered 2016-03-06 – 2016-03-08 (×4): 1 mg via INTRAVENOUS
  Filled 2016-03-05 (×4): qty 1

## 2016-03-05 MED ORDER — SODIUM CHLORIDE 0.9 % IV SOLN
INTRAVENOUS | Status: DC
  Administered 2016-03-06: 1000 mL via INTRAVENOUS
  Administered 2016-03-06 – 2016-03-08 (×3): via INTRAVENOUS

## 2016-03-05 MED ORDER — PANTOPRAZOLE SODIUM 40 MG IV SOLR: 40.0000 mg | Freq: Two times a day (BID) | INTRAVENOUS | Status: DC

## 2016-03-05 MED ORDER — ACETAMINOPHEN 650 MG RE SUPP
650.0000 mg | Freq: Once | RECTAL | Status: AC
  Administered 2016-03-05: 650 mg via RECTAL
  Filled 2016-03-05: qty 1

## 2016-03-05 MED ORDER — SODIUM CHLORIDE 0.9 % IV SOLN
10.0000 mL/h | Freq: Once | INTRAVENOUS | Status: AC
  Administered 2016-03-05: 10 mL/h via INTRAVENOUS

## 2016-03-05 MED ORDER — SODIUM CHLORIDE 0.9 % IV SOLN
8.0000 mg/h | INTRAVENOUS | Status: DC
  Administered 2016-03-05 – 2016-03-06 (×2): 8 mg/h via INTRAVENOUS
  Filled 2016-03-05 (×4): qty 80

## 2016-03-05 MED ORDER — SODIUM CHLORIDE 0.9% FLUSH
3.0000 mL | Freq: Two times a day (BID) | INTRAVENOUS | Status: DC
  Administered 2016-03-06 – 2016-03-12 (×12): 3 mL via INTRAVENOUS

## 2016-03-05 MED ORDER — SODIUM CHLORIDE 0.9 % IV BOLUS (SEPSIS)
1000.0000 mL | Freq: Once | INTRAVENOUS | Status: AC
  Administered 2016-03-05: 1000 mL via INTRAVENOUS

## 2016-03-05 NOTE — Progress Notes (Signed)
ED CM met with patient and SO Brandon [name] [phone] to discuss transitional care planning.  PCP Dr Laurelyn Sickle, health coverage is [place] [employer] Prior hx of CVA with some cognitive deficits.  Patient lives with SO Brandon [name], does not receive any home health services, or use any assistive devices.  Unit CM will continue to follow up for transitional care planning.

## 2016-03-05 NOTE — ED Provider Notes (Addendum)
CSN: [id number]     Arrival date & time 03/05/16  1459 History  By signing my name below, I, Terrance Branch, attest that this documentation has been prepared under the direction and in the presence of Raeford Razor, MD. Electronically Signed: Evon Slack, ED Scribe. 03/05/2016. 3:19 PM.    Chief Complaint  Patient presents with  . Loss of Consciousness    Patient is a 50 y.o. male presenting with syncope. The history is provided by the patient and the EMS personnel. No language interpreter was used.  Loss of Consciousness  HPI Comments: Brandon [name] is a 50 y.o. male brought in by ambulance, who presents to the Emergency Department complaining of LOC onset PTA. Pt was recently discharged from [facility] hospital [facility] ago. Per ems sister was found pt laying on the cough covered in feces. Pt states that he had been laying on the couch for about 2 days. Pt states that he was not able to get up due feeling dizzy. He states that every time he tried to ambulate he felt as if he was going to fall. Pt does report left sided deficits from previous stoke. Per ems when they tried to sit him up he had lost consciousness and had a bowel movement.     Past Medical History  Diagnosis Date  . Alcohol abuse   . Pancreatitis     CT findings in May 2011 with inflammation and pseudocyst  . GERD (gastroesophageal reflux disease)   . CAD (coronary artery disease) 06/13/2012    Calcification noted on CTA of chest in 2012 Wall motion abnormality on ECHO    . 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 Hyperlipidemia   . Headache(784.0)     migraine  . Heart disease   . Depression   . Anxiety   . Hypertension   . Carotid artery disease    Past Surgical History  Procedure Laterality Date  . None    . Carotid angiogram N/A 06/15/2012    Procedure: CAROTID ANGIOGRAM;  Surgeon: Chuck Hint, MD;  Location: [facility] CATH LAB;  Service: Cardiovascular;   Laterality: N/A;  . Cardiac catheterization N/A 03/15/2015    Procedure: Left Heart Cath;  Surgeon: Laurier Nancy, MD;  Location: [facility] INVASIVE CV LAB;  Service: Cardiovascular;  Laterality: N/A;  . Cardiac catheterization N/A 03/16/2015    Procedure: Coronary Stent Intervention;  Surgeon: Alwyn Pea, MD;  Location: [facility] INVASIVE CV LAB;  Service: Cardiovascular;  Laterality: N/A;  . Peripheral vascular catheterization Left 04/06/2015    Procedure: Carotid PTA/Stent Intervention;  Surgeon: Annice Needy, MD;  Location: [facility] INVASIVE CV LAB;  Service: Cardiovascular;  Laterality: Left;   Family History  Problem Relation Age of Onset  . Stroke Mother     deceased  . Coronary artery disease Mother   . Alcohol abuse Mother   . Cancer Mother   . Hypertension Father     alive  . Alcohol abuse Father   . Diabetes Father   . Kidney disease Father    Social History  Substance Use Topics  . Smoking status: Current Every Day Smoker -- 1.00 packs/day for 30 years    Types: Cigarettes    Last Attempt to Quit: 04/12/2015  . Smokeless tobacco: Never Used     Comment: smoking less  . Alcohol Use: 3.6 oz/week    6 Cans of beer per week  Comment: drinks 6-12 beer daily    Review of Systems  Cardiovascular: Positive for syncope.  All other systems reviewed and are negative.    Allergies  No known allergies  Home Medications   Prior to Admission medications   Medication Sig Start Date End Date Taking? Authorizing Provider  albuterol (PROVENTIL) (2.5 MG/3ML) 0.083% nebulizer solution Take 2.5 mg by nebulization as needed for wheezing or shortness of breath.    Historical Provider, MD  amLODipine (NORVASC) 10 MG tablet Take 1 tablet (10 mg total) by mouth once. Patient not taking: Reported on 04/06/2015 03/17/15   Rise Mu, PA-C  aspirin 81 MG tablet Take 81 mg by mouth daily.      Historical Provider, MD  B Complex Vitamins (VITAMIN-B COMPLEX PO) Take 800 mg by mouth daily.     Historical Provider, MD  buPROPion (WELLBUTRIN SR) 150 MG 12 hr tablet Start 1 in the am for 3 days then 1 tab twice daily. Patient not taking: Reported on 03/15/2015 08/04/14   Ronal Fear, NP  clopidogrel (PLAVIX) 75 MG tablet Take 1 tablet (75 mg total) by mouth daily with breakfast. 03/17/15   Rise Mu, PA-C  cyclobenzaprine (FLEXERIL) 10 MG tablet Take 1 tablet (10 mg total) by mouth 3 (three) times daily as needed for muscle spasms. 09/20/15   Micki Riley, MD  famotidine (PEPCID) 20 MG tablet Take 1 tablet (20 mg total) by mouth 2 (two) times daily as needed for heartburn. 11/26/12   Dow Adolph, MD  folic acid (FOLVITE) 1 MG tablet Take 1 mg by mouth daily.      Historical Provider, MD  GARLIC OIL 1500 PO Take by mouth daily.    Historical Provider, MD  hydrochlorothiazide (MICROZIDE) 12.5 MG capsule Take 12.5 mg by mouth daily.    Historical Provider, MD  lubiprostone (AMITIZA) 24 MCG capsule Take 24 mcg by mouth as needed for constipation.    Historical Provider, MD  metoprolol (LOPRESSOR) 50 MG tablet Take 50 mg by mouth daily.    Historical Provider, MD  nicotine (NICODERM CQ - DOSED IN MG/24 HOURS) 21 mg/24hr patch Place 1 patch (21 mg total) onto the skin daily. 03/17/15   Rise Mu, PA-C  Omega-3 Fatty Acids (FISH OIL PO) Take 1 capsule by mouth daily.    Historical Provider, MD  ondansetron (ZOFRAN) 4 MG tablet Take 1 tablet (4 mg total) by mouth every 6 (six) hours. Patient taking differently: Take 4 mg by mouth every 6 (six) hours as needed for nausea.  02/16/14   Toy Cookey, MD  oxyCODONE-acetaminophen (PERCOCET/ROXICET) 5-325 MG per tablet Take by mouth every 4 (four) hours as needed for severe pain.    Historical Provider, MD  Pancrelipase, Lip-Prot-Amyl, (ZENPEP) 25000 UNITS CPEP Take 1-2 capsules by mouth 3 (three) times daily with meals.     Historical Provider, MD  simvastatin (ZOCOR) 20 MG tablet Take 20 mg by mouth daily.    Historical Provider, MD   tiotropium (SPIRIVA HANDIHALER) 18 MCG inhalation capsule Place into inhaler and inhale.    Historical Provider, MD  traMADol (ULTRAM) 50 MG tablet Take 1-2 tablets (50-100 mg total) by mouth every 6 (six) hours as needed. for pain 11/14/15   York Spaniel, MD  traZODone (DESYREL) 150 MG tablet Take 1 tablet (150 mg total) by mouth at bedtime. 08/23/13   Carolan Clines, MD   There were no vitals taken for this visit.   Physical Exam  Constitutional: He appears well-developed and well-nourished.  Disheveled appearance. Covered in black stool.   HENT:  Head: Normocephalic and atraumatic.  Eyes: EOM are normal.  Neck: Normal range of motion.  Cardiovascular: Regular rhythm, normal heart sounds and intact distal pulses.   tachycardic  Pulmonary/Chest: Effort normal and breath sounds normal. No respiratory distress.  Abdominal: Soft. He exhibits no distension. There is no tenderness.  Musculoskeletal:  L sided neglect  Neurological: He is alert.  Oriented to self only. Follows commands on R.   Skin: Skin is warm and dry.  Psychiatric: Judgment normal.  Nursing note and vitals reviewed.   ED Course  Procedures (including critical care time)  CRITICAL CARE Performed by: Raeford RazorKOHUT, Kaslyn Richburg Total critical care time: 40 minutes Critical care time was exclusive of separately billable procedures and treating other patients. Critical care was necessary to treat or prevent imminent or life-threatening deterioration. Critical care was time spent personally by me on the following activities: development of treatment plan with patient and/or surrogate as well as nursing, discussions with consultants, evaluation of patient's response to treatment, examination of patient, obtaining history from patient or surrogate, ordering and performing treatments and interventions, ordering and review of laboratory studies, ordering and review of radiographic studies, pulse oximetry and re-evaluation of patient's  condition.   DIAGNOSTIC STUDIES:    COORDINATION OF CARE: 3:17 PM-Discussed treatment plan with pt at bedside and pt agreed to plan.     Labs Review Labs Reviewed  CBC WITH DIFFERENTIAL/PLATELET - Abnormal; Notable for the following:    WBC 13.2 (*)    RBC 1.60 (*)    Hemoglobin 4.8 (*)    HCT 14.8 (*)    RDW 17.4 (*)    Neutro Abs 11.5 (*)    All other components within normal limits  BASIC METABOLIC PANEL - Abnormal; Notable for the following:    Sodium 127 (*)    Chloride 92 (*)    Glucose, Bld 223 (*)    BUN 66 (*)    Calcium 8.5 (*)    All other components within normal limits  TROPONIN I - Abnormal; Notable for the following:    Troponin I 0.07 (*)    All other components within normal limits  CK - Abnormal; Notable for the following:    Total CK 511 (*)    All other components within normal limits  MAGNESIUM  LACTIC ACID, PLASMA  URINALYSIS, ROUTINE W REFLEX MICROSCOPIC (NOT [facility])  LACTIC ACID, PLASMA  OCCULT BLOOD X 1 CARD TO LAB, STOOL  TYPE AND SCREEN  PREPARE RBC (CROSSMATCH)    Imaging Review Ct Head Wo Contrast  03/05/2016  CLINICAL DATA:  Left hemi neglect EXAM: CT HEAD WITHOUT CONTRAST TECHNIQUE: Contiguous axial images were obtained from the base of the skull through the vertex without intravenous contrast. COMPARISON:  MRI 11/04/2013 FINDINGS: Moderate atrophy. Diffuse ventricular enlargement unchanged from the prior MRI. This may be due to communicating hydrocephalus versus atrophy. Ill-defined hypodensity in the high right parietal lobe over the convexity. This is most likely volume averaging of a prominent sulcus which appears similar to the prior MRI. Acute infarct considered less likely given the appearance. Otherwise no acute infarct. Negative for hemorrhage or mass lesion. IMPRESSION: Generalized atrophy with moderate ventricular enlargement, stable from the prior MRI 11/04/2013. Ventricular enlargement raises the possibility of communicating  hydrocephalus versus atrophy Hypodensity high right frontal lobe most likely volume averaging of a prominent sulcus. Electronically Signed   By: Marlan Palauharles  Clark  M.D.   On: 03/05/2016 16:31   I have personally reviewed and evaluated these images and lab results as part of my medical decision-making.   EKG Interpretation   Date/Time:  Tuesday March 05 2016 15:10:47 EDT Ventricular Rate:  106 PR Interval:  141 QRS Duration: 94 QT Interval:  361 QTC Calculation: 479 R Axis:   55 Text Interpretation:  Sinus tachycardia Borderline repolarization  abnormality Borderline prolonged QT interval Confirmed by Juleen China  MD,  Jerzy Crotteau ([id number]) on 03/05/2016 4:50:43 PM      MDM   Final diagnoses:  Severe anemia  UGIB (upper gastrointestinal bleed)  Left-sided neglect    50 year old male brought in by EMS found poorly responsive on his couch at home. Unsure when his last seen normal. Patient states he had some type of procedure at [place] last week but I do not see record of this through Epic. He has significant deficits. He has left hemi neglect. He has had prior CVA affecting his left side. Neurology notes report improvement though to the point of just having paresthesias and some cognitive impairment. This appears to be a significant change from his baseline. Unclear how long he was lying on his couch.  Fianc now bedside. She reports that she last spoke to him approximately 12 noon yesterday. He seemed to be okay when speaking to him on the phone. She reports that testing patient mentioned was some type of outpatient procedure. She is not exactly sure of the details but from her description I get the impression that it may have been a CT of his coronaries? ("They gave him medicine to slow his heart down and then they put him in a scanner.")  Workup significant for hemoglobin of 4.8. He is on plavix and aspirin. HR 100-110. No hypotension. Likely secondary to GI bleed. He has melanotic appearing stool.  His BUN is significantly elevated with a normal creatinine. Discussed with pharmacy for Protonix. Will transfuse. Will discuss with GI. Unassigned admission. Neurology consulted for evaluation for the left sided neglect. Not TPA candidate given that last normal was at least over 24 hours ago. Troponin is mildly elevated. He denies any chest pain. This EKG is changed from priors. These changes are diffuse. I suspect that this is more demand ischemia secondary to severe anemia.  5:26 PM Discussed with Dr Leone Payor, GI. Will be seen by team in consultation in am.   I personally preformed the services scribed in my presence. The recorded information has been reviewed is accurate. Raeford Razor, MD.     Raeford Razor, MD 03/05/16 [phone]

## 2016-03-05 NOTE — ED Notes (Signed)
Pt reports to the ED for eval generalized weakness. Pt is from home. He had some procedure at home on Friday at [facility]. He was given various drugs for relaxation and she dropped him off and he laid on the couch and was left at home then family came back today and found him in the same spot on the couch and he had not moved. Pt has been incontinent of bowel and bladder than entire time. EMS sat him up on the stair chair and he had a syncopal episode and had a large episode of loose stool. Pt hypotensive en route at 90s systolic. Pt lethargic. Pt has left sided deficits from previous stroke. Pt disoriented to situation and time. Resp e/u. Large amount of incontinence noted to patient.

## 2016-03-05 NOTE — Consult Note (Signed)
NEURO HOSPITALIST CONSULT NOTE   Requestig physician: Dr. Ella Jubilee  Reason for Consult: New left hemiplegia.   History obtained from:  Patient and Chart     HPI:                                                                                                                                          Brandon [name] is an 50 y.o. male who presents with acute onset of left hemiplegia. He was last seen normal by his wife at 3 PM yesterday, and last heard to be normal by phone at midnight. Today, his son found him at home immobile on the couch covered in urine and stool. The patient was transported by EMS to the ED and exam revealed dense left sided weakness. He was out of the IV tPA and endovascular/interventional time windows. He was unable to get up due to feeling "dizzy" and felt as though he would fall when he "tried to walk". He has mild left sided residual deficits from prior stroke. Per EMS, when they tried to sit him up he lost consciousness and was incontinent again of stool.   Past Medical History  Diagnosis Date  . Alcohol abuse   . Pancreatitis     CT findings in May 2011 with inflammation and pseudocyst  . GERD (gastroesophageal reflux disease)   . CAD (coronary artery disease) 06/13/2012    Calcification noted on CTA of chest in 2012 Wall motion abnormality on ECHO    . 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 Hyperlipidemia   . Headache(784.0)     migraine  . Heart disease   . Depression   . Anxiety   . Hypertension   . Carotid artery disease [facility])     Past Surgical History  Procedure Laterality Date  . None    . Carotid angiogram N/A 06/15/2012    Procedure: CAROTID ANGIOGRAM;  Surgeon: Chuck Hint, MD;  Location: [facility] CATH LAB;  Service: Cardiovascular;  Laterality: N/A;  . Cardiac catheterization N/A 03/15/2015    Procedure: Left Heart Cath;  Surgeon: Laurier Nancy, MD;  Location: [facility] INVASIVE CV  LAB;  Service: Cardiovascular;  Laterality: N/A;  . Cardiac catheterization N/A 03/16/2015    Procedure: Coronary Stent Intervention;  Surgeon: Alwyn Pea, MD;  Location: [facility] INVASIVE CV LAB;  Service: Cardiovascular;  Laterality: N/A;  . Peripheral vascular catheterization Left 04/06/2015    Procedure: Carotid PTA/Stent Intervention;  Surgeon: Annice Needy, MD;  Location: [facility] INVASIVE CV LAB;  Service: Cardiovascular;  Laterality: Left;    Family History  Problem Relation Age of Onset  . Stroke Mother     deceased  . Coronary artery disease Mother   .  Alcohol abuse Mother   . Cancer Mother   . Hypertension Father     alive  . Alcohol abuse Father   . Diabetes Father   . Kidney disease Father     Social History:  reports that he has been smoking Cigarettes.  He has a 30 pack-year smoking history. He has never used smokeless tobacco. He reports that he drinks about 3.6 oz of alcohol per week. He reports that he does not use illicit drugs.  Allergies  Allergen Reactions  . No Known Allergies     MEDICATIONS:                                                                                                                      Current facility-administered medications:  .  pantoprazole (PROTONIX) 80 mg in sodium chloride 0.9 % 250 mL (0.32 mg/mL) infusion, 8 mg/hr, Intravenous, Continuous, Mauricio Annett Gulaaniel Arrien, MD  Current outpatient prescriptions:  .  albuterol (PROAIR HFA) 108 (90 Base) MCG/ACT inhaler, Inhale 2 puffs into the lungs every 6 (six) hours as needed for wheezing or shortness of breath. , Disp: , Rfl:  .  albuterol (PROVENTIL) (2.5 MG/3ML) 0.083% nebulizer solution, Take 2.5 mg by nebulization as needed for wheezing or shortness of breath., Disp: , Rfl:  .  amLODipine (NORVASC) 10 MG tablet, Take 1 tablet (10 mg total) by mouth once. (Patient taking differently: Take 10 mg by mouth daily. ), Disp: 30 tablet, Rfl: 2 .  aspirin 81 MG tablet, Take 81 mg by mouth  every morning. , Disp: , Rfl:  .  B Complex Vitamins (VITAMIN-B COMPLEX PO), Take 1 tablet by mouth daily. , Disp: , Rfl:  .  buPROPion (WELLBUTRIN SR) 150 MG 12 hr tablet, Start 1 in the am for 3 days then 1 tab twice daily., Disp: 60 tablet, Rfl: 5 .  busPIRone (BUSPAR) 10 MG tablet, Take 10 mg by mouth 2 (two) times daily., Disp: , Rfl:  .  clopidogrel (PLAVIX) 75 MG tablet, Take 1 tablet (75 mg total) by mouth daily with breakfast., Disp: 30 tablet, Rfl: 2 .  cyclobenzaprine (FLEXERIL) 10 MG tablet, Take 1 tablet (10 mg total) by mouth 3 (three) times daily as needed for muscle spasms., Disp: 90 tablet, Rfl: 5 .  folic acid (FOLVITE) 1 MG tablet, Take 1 mg by mouth daily.  , Disp: , Rfl:  .  GARLIC OIL 1500 PO, Take 1,500 mg by mouth daily. , Disp: , Rfl:  .  hydrochlorothiazide (MICROZIDE) 12.5 MG capsule, Take 12.5 mg by mouth daily., Disp: , Rfl:  .  lisinopril (PRINIVIL,ZESTRIL) 10 MG tablet, Take 10 mg by mouth daily., Disp: , Rfl:  .  lubiprostone (AMITIZA) 24 MCG capsule, Take 24 mcg by mouth as needed for constipation., Disp: , Rfl:  .  metoprolol (LOPRESSOR) 50 MG tablet, Take 50 mg by mouth daily., Disp: , Rfl:  .  Multiple Vitamin (MULTIVITAMIN) tablet, Take 1 tablet by mouth daily., Disp: , Rfl:  .  Omega-3 Fatty Acids (FISH OIL PO), Take 1 capsule by mouth daily., Disp: , Rfl:  .  omeprazole (PRILOSEC) 20 MG capsule, Take 20 mg by mouth daily., Disp: , Rfl:  .  Pancrelipase, Lip-Prot-Amyl, (ZENPEP) 25000 UNITS CPEP, Take 1-2 capsules by mouth 3 (three) times daily with meals. , Disp: , Rfl:  .  simvastatin (ZOCOR) 20 MG tablet, Take 20 mg by mouth daily at 6 PM. , Disp: , Rfl:  .  tiotropium (SPIRIVA HANDIHALER) 18 MCG inhalation capsule, Place 18 mcg into inhaler and inhale daily. , Disp: , Rfl:  .  traMADol (ULTRAM) 50 MG tablet, Take 1-2 tablets (50-100 mg total) by mouth every 6 (six) hours as needed. for pain, Disp: 90 tablet, Rfl: 1 .  traZODone (DESYREL) 150 MG tablet, Take  1 tablet (150 mg total) by mouth at bedtime., Disp: 30 tablet, Rfl: 1    ROS:                                                                                                                                       History obtained from patient. Positive for incontinence. Does not endorse pain at time of neurological evaluation.   Blood pressure 132/77, pulse 102, temperature 100 F (37.8 C), temperature source Rectal, resp. rate 19, SpO2 99 %.  Neurologic Examination:                                                                                                      HEENT-  Normocephalic, no lesions, without obvious abnormality.  Normal external eye and conjunctiva.   Lungs- No gross wheezes.  Extremities- No edema noted.  Skin-No rash noted.   Neurological Examination Mental Status: Awake. Oriented to state and year as well as that he is in the hospital. States that [place] in [place] and today is "Thursday". No agitation noted. Left hemineglect. Dysarthric. Follows all commands. Some difficulty with naming and right-left identification.  Cranial Nerves: II: Left visual hemineglect. PERRL.  III,IV, VI: ptosis not present, EOM are conjugate with difficulty volitionally crossing the midline to left. Doll's eye reflex intact to left and right.  V,VII: Left facial droop. FT sensation equal bilaterally.  VIII: Intact to conversation.  IX,X: Mild hypophonia.  XI: Rotates head preferentially to the right.  XII: midline tongue extension Motor: Right : Upper extremity   5/5    Left:     Upper extremity   1-2/5  Lower extremity   5/5  Lower extremity   0/5 volitionally with 2/5 withdrawal from noxious.  Sensory: FT intact when testing limbs individually. Left sided extinction present.  Deep Tendon Reflexes: Crossed adductor response with left patellar. 2+ right patellar. 1+ ankles bilaterally. 2+ right biceps/brachio. 1+ left biceps/brachio. Right toe downgoing, left toe upgoing.   Cerebellar: No ataxia with FNF on right.  Gait: Deferred.   Lab Results: Basic Metabolic Panel:  Recent Labs Lab 03/05/16 1555  NA 127*  K 3.5  CL 92*  CO2 23  GLUCOSE 223*  BUN 66*  CREATININE 1.19  CALCIUM 8.5*  MG 2.3    Liver Function Tests: No results for input(s): AST, ALT, ALKPHOS, BILITOT, PROT, ALBUMIN in the last 168 hours. No results for input(s): LIPASE, AMYLASE in the last 168 hours. No results for input(s): AMMONIA in the last 168 hours.  CBC:  Recent Labs Lab 03/05/16 1555  WBC 13.2*  NEUTROABS 11.5*  HGB 4.8*  HCT 14.8*  MCV 92.5  PLT 192    Cardiac Enzymes:  Recent Labs Lab 03/05/16 1555  CKTOTAL 511*  TROPONINI 0.07*    Lipid Panel: No results for input(s): CHOL, TRIG, HDL, CHOLHDL, VLDL, LDLCALC in the last 168 hours.  CBG: No results for input(s): GLUCAP in the last 168 hours.  Microbiology: Results for orders placed or performed during the hospital encounter of 04/06/15  MRSA PCR Screening     Status: None   Collection Time: 04/06/15 11:46 AM  Result Value Ref Range Status   MRSA by PCR NEGATIVE NEGATIVE Final    Comment:        The GeneXpert MRSA Assay (FDA approved for NASAL specimens only), is one component of a comprehensive MRSA colonization surveillance program. It is not intended to diagnose MRSA infection nor to guide or monitor treatment for MRSA infections.     Coagulation Studies: No results for input(s): LABPROT, INR in the last 72 hours.  Imaging: Ct Head Wo Contrast  03/05/2016  CLINICAL DATA:  Left hemi neglect EXAM: CT HEAD WITHOUT CONTRAST TECHNIQUE: Contiguous axial images were obtained from the base of the skull through the vertex without intravenous contrast. COMPARISON:  MRI 11/04/2013 FINDINGS: Moderate atrophy. Diffuse ventricular enlargement unchanged from the prior MRI. This may be due to communicating hydrocephalus versus atrophy. Ill-defined hypodensity in the high right parietal lobe over  the convexity. This is most likely volume averaging of a prominent sulcus which appears similar to the prior MRI. Acute infarct considered less likely given the appearance. Otherwise no acute infarct. Negative for hemorrhage or mass lesion. IMPRESSION: Generalized atrophy with moderate ventricular enlargement, stable from the prior MRI 11/04/2013. Ventricular enlargement raises the possibility of communicating hydrocephalus versus atrophy Hypodensity high right frontal lobe most likely volume averaging of a prominent sulcus. Electronically Signed   By: Marlan Palau M.D.   On: 03/05/2016 16:31    Impression: 1. New onset of left hemiplegia and hemineglect. Symptoms and exam findings most consistent with a right parieto-frontal ischemic stroke. CT reveals hypodensity along the high convexity of the right frontal lobe, which likely accounts for at least a portion of his deficits.  2. CT also reveals generalized atrophy with moderate ventricular enlargement, stable from the prior MRI 11/04/2013.  3. Hyponatremia.  4. Mildly elevated CK level. Most likely secondary to extended period of immobilization.  5. Elevated BUN/Cr. Most likely secondary to volume depletion.  6. Severe anemia.   Recommendations: 1. Continue ASA and Plavix.  2. After CK level normalizes, increase simvastatin  to 40 mg po qd.  3. MRI brain, MRA of head, carotid ultrasound, TTE.  4. BP management.  5. PT/OT/Speech.  6. IV rehydration. Carefully monitor electrolytes. Do not correct Na level at a rate faster than 0.5 meq/L per hour, or by more than 10 meq/L per day.  7. Management of anemia and work up for underlying etiology.  8. CIWA protocol.   03/05/2016, 6:17 PM

## 2016-03-05 NOTE — H&P (Addendum)
History and Physical    Brandon [name] ZOX:[id number] DOB: June 11, 1966 DOA: 03/05/2016  PCP: Laurier Nancy, MD (Confirm with patient/family/NH records and if not entered, this has to be entered at [facility] point of entry) Patient coming from: Home  Chief Complaint: Altered mental status  HPI: Brandon [name] is a 50 y.o. male with medical history significant of ischemic heart disease and CVA. Patient had a stress test about 4 days ago, since then he has been not feeling well mainly dyspnea and easy fatigability. He complained to his friend about his ongoing symptoms over the phone over last 3 days. 24 hours ago he was found laying in his couch confused and disoriented, he was covered in feces and urine, he was noted to be weak on the left side and he was brought into the hospital for evaluation. All information is obtained from his friend at bedside. Patient is confused unable to give detailed history.   Patient has had a CVA in 2013, back then he had a left sided weakness, he received TPA with improvement of his symptoms. He is not to have carotid artery stenosis completely occluded on the left and status post stents on the right. Patient does have coronary stents as well. He is currently taking dual antiplatelet therapy with aspirin and Plavix.   Review of Systems: As per HPI otherwise 10 point review of systems negative.  Unable to obtain due to patient's confusion and disorientation.  Past Medical History  Diagnosis Date  . Alcohol abuse   . Pancreatitis     CT findings in May 2011 with inflammation and pseudocyst  . GERD (gastroesophageal reflux disease)   . CAD (coronary artery disease) 06/13/2012    Calcification noted on CTA of chest in 2012 Wall motion abnormality on ECHO    . 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 Hyperlipidemia   . Headache(784.0)     migraine  . Heart disease   . Depression   . Anxiety   . Hypertension   . Carotid  artery disease [facility])     Past Surgical History  Procedure Laterality Date  . None    . Carotid angiogram N/A 06/15/2012    Procedure: CAROTID ANGIOGRAM;  Surgeon: Chuck Hint, MD;  Location: [facility] CATH LAB;  Service: Cardiovascular;  Laterality: N/A;  . Cardiac catheterization N/A 03/15/2015    Procedure: Left Heart Cath;  Surgeon: Laurier Nancy, MD;  Location: [facility] INVASIVE CV LAB;  Service: Cardiovascular;  Laterality: N/A;  . Cardiac catheterization N/A 03/16/2015    Procedure: Coronary Stent Intervention;  Surgeon: Alwyn Pea, MD;  Location: [facility] INVASIVE CV LAB;  Service: Cardiovascular;  Laterality: N/A;  . Peripheral vascular catheterization Left 04/06/2015    Procedure: Carotid PTA/Stent Intervention;  Surgeon: Annice Needy, MD;  Location: [facility] INVASIVE CV LAB;  Service: Cardiovascular;  Laterality: Left;     reports that he has been smoking Cigarettes.  He has a 30 pack-year smoking history. He has never used smokeless tobacco. He reports that he drinks about 3.6 oz of alcohol per week. He reports that he does not use illicit drugs.  Allergies  Allergen Reactions  . No Known Allergies     Family History  Problem Relation Age of Onset  . Stroke Mother     deceased  . Coronary artery disease Mother   . Alcohol abuse Mother   . Cancer Mother   . Hypertension Father  alive  . Alcohol abuse Father   . Diabetes Father   . Kidney disease Father    Past social history. Patient continued to smoke about a pack per day for many years. Per his friend he continues to use alcohol almost daily. No illegal drug abuse.  Prior to Admission medications   Medication Sig Start Date End Date Taking? Authorizing Provider  albuterol (PROAIR HFA) 108 (90 Base) MCG/ACT inhaler Inhale 2 puffs into the lungs every 6 (six) hours as needed for wheezing or shortness of breath.    Yes Historical Provider, MD  albuterol (PROVENTIL) (2.5 MG/3ML) 0.083% nebulizer solution Take 2.5 mg by  nebulization as needed for wheezing or shortness of breath.   Yes Historical Provider, MD  amLODipine (NORVASC) 10 MG tablet Take 1 tablet (10 mg total) by mouth once. Patient taking differently: Take 10 mg by mouth daily.  03/17/15  Yes Rise Mu, PA-C  aspirin 81 MG tablet Take 81 mg by mouth every morning.    Yes Historical Provider, MD  B Complex Vitamins (VITAMIN-B COMPLEX PO) Take 1 tablet by mouth daily.    Yes Historical Provider, MD  buPROPion (WELLBUTRIN SR) 150 MG 12 hr tablet Start 1 in the am for 3 days then 1 tab twice daily. 08/04/14  Yes Ronal Fear, NP  busPIRone (BUSPAR) 10 MG tablet Take 10 mg by mouth 2 (two) times daily.   Yes Historical Provider, MD  clopidogrel (PLAVIX) 75 MG tablet Take 1 tablet (75 mg total) by mouth daily with breakfast. 03/17/15  Yes Rise Mu, PA-C  cyclobenzaprine (FLEXERIL) 10 MG tablet Take 1 tablet (10 mg total) by mouth 3 (three) times daily as needed for muscle spasms. 09/20/15  Yes Micki Riley, MD  folic acid (FOLVITE) 1 MG tablet Take 1 mg by mouth daily.     Yes Historical Provider, MD  GARLIC OIL 1500 PO Take 1,500 mg by mouth daily.    Yes Historical Provider, MD  hydrochlorothiazide (MICROZIDE) 12.5 MG capsule Take 12.5 mg by mouth daily.   Yes Historical Provider, MD  lisinopril (PRINIVIL,ZESTRIL) 10 MG tablet Take 10 mg by mouth daily.   Yes Historical Provider, MD  lubiprostone (AMITIZA) 24 MCG capsule Take 24 mcg by mouth as needed for constipation.   Yes Historical Provider, MD  metoprolol (LOPRESSOR) 50 MG tablet Take 50 mg by mouth daily.   Yes Historical Provider, MD  Multiple Vitamin (MULTIVITAMIN) tablet Take 1 tablet by mouth daily.   Yes Historical Provider, MD  Omega-3 Fatty Acids (FISH OIL PO) Take 1 capsule by mouth daily.   Yes Historical Provider, MD  omeprazole (PRILOSEC) 20 MG capsule Take 20 mg by mouth daily.   Yes Historical Provider, MD  Pancrelipase, Lip-Prot-Amyl, (ZENPEP) 25000 UNITS CPEP Take 1-2  capsules by mouth 3 (three) times daily with meals.    Yes Historical Provider, MD  simvastatin (ZOCOR) 20 MG tablet Take 20 mg by mouth daily at 6 PM.    Yes Historical Provider, MD  tiotropium (SPIRIVA HANDIHALER) 18 MCG inhalation capsule Place 18 mcg into inhaler and inhale daily.    Yes Historical Provider, MD  traMADol (ULTRAM) 50 MG tablet Take 1-2 tablets (50-100 mg total) by mouth every 6 (six) hours as needed. for pain 11/14/15  Yes York Spaniel, MD  traZODone (DESYREL) 150 MG tablet Take 1 tablet (150 mg total) by mouth at bedtime. 08/23/13  Yes Carolan Clines, MD    Physical Exam: Ceasar Mons Vitals:  03/05/16 1745 03/05/16 1800 03/05/16 1815 03/05/16 1830  BP: 132/77 130/79 148/92 152/80  Pulse: 102   120  Temp:      TempSrc:      Resp: 19 18 15 23   SpO2: 99%   100%      Constitutional: In mild distress, he has a right-sided gaze preference. Filed Vitals:   03/05/16 1745 03/05/16 1800 03/05/16 1815 03/05/16 1830  BP: 132/77 130/79 148/92 152/80  Pulse: 102   120  Temp:      TempSrc:      Resp: 19 18 15 23   SpO2: 99%   100%   Eyes: Negative is pale, no icterus. ENMT: Mucous membranes are dry. Posterior pharynx clear of any exudate or lesions.Normal dentition.  Neck: normal, supple, no masses, no thyromegaly Respiratory: clear to auscultation bilaterally, no wheezing, no crackles. Normal respiratory effort. No accessory muscle use. Mild decreased breath sounds at bases due to poor history effort Cardiovascular: Regular rate and rhythm, no murmurs / rubs / gallops. No extremity edema. 2+ pedal pulses. No carotid bruits.  Abdomen: no tenderness, no masses palpated. No hepatosplenomegaly. Bowel sounds positive.  Musculoskeletal: no clubbing / cyanosis. No joint deformity upper and lower extremities. Good ROM, no contractures. Normal muscle tone.  Skin: no rashes, lesions, ulcers. No induration Neurologic: Patient has a right-sided gaze preference, he has preserved strength on  his right upper lotion fatigue, he has significant paresis on the left upper and left lower extremity. He is able to follow commands on his right side. He is noted to be confused and disoriented. His pupils are reactive to light, no facial droop. Psychiatric unable to assess   Labs on Admission: I have personally reviewed following labs and imaging studies  CBC:  Recent Labs Lab 03/05/16 1555  WBC 13.2*  NEUTROABS 11.5*  HGB 4.8*  HCT 14.8*  MCV 92.5  PLT 192   Basic Metabolic Panel:  Recent Labs Lab 03/05/16 1555  NA 127*  K 3.5  CL 92*  CO2 23  GLUCOSE 223*  BUN 66*  CREATININE 1.19  CALCIUM 8.5*  MG 2.3   GFR: CrCl cannot be calculated (Unknown ideal weight.). Liver Function Tests: No results for input(s): AST, ALT, ALKPHOS, BILITOT, PROT, ALBUMIN in the last 168 hours. No results for input(s): LIPASE, AMYLASE in the last 168 hours. No results for input(s): AMMONIA in the last 168 hours. Coagulation Profile: No results for input(s): INR, PROTIME in the last 168 hours. Cardiac Enzymes:  Recent Labs Lab 03/05/16 1555  CKTOTAL 511*  TROPONINI 0.07*   BNP (last 3 results) No results for input(s): PROBNP in the last 8760 hours. HbA1C: No results for input(s): HGBA1C in the last 72 hours. CBG: No results for input(s): GLUCAP in the last 168 hours. Lipid Profile: No results for input(s): CHOL, HDL, LDLCALC, TRIG, CHOLHDL, LDLDIRECT in the last 72 hours. Thyroid Function Tests: No results for input(s): TSH, T4TOTAL, FREET4, T3FREE, THYROIDAB in the last 72 hours. Anemia Panel: No results for input(s): VITAMINB12, FOLATE, FERRITIN, TIBC, IRON, RETICCTPCT in the last 72 hours. Urine analysis:    Component Value Date/Time   COLORURINE YELLOW 03/05/2016 1740   APPEARANCEUR CLEAR 03/05/2016 1740   LABSPEC 1.017 03/05/2016 1740   PHURINE 5.5 03/05/2016 1740   GLUCOSEU 250* 03/05/2016 1740   HGBUR NEGATIVE 03/05/2016 1740   BILIRUBINUR NEGATIVE 03/05/2016  1740   KETONESUR 15* 03/05/2016 1740   PROTEINUR NEGATIVE 03/05/2016 1740   UROBILINOGEN 0.2 02/16/2014 1742   NITRITE  NEGATIVE 03/05/2016 1740   LEUKOCYTESUR NEGATIVE 03/05/2016 1740   Sepsis Labs: !!!!!!!!!!!!!!!!!!!!!!!!!!!!!!!!!!!!!!!!!!!! (procalcitonin:4,lacticidven:4) )No results found for this or any previous visit (from the past 240 hour(s)).   Radiological Exams on Admission: Ct Head Wo Contrast  03/05/2016  CLINICAL DATA:  Left hemi neglect EXAM: CT HEAD WITHOUT CONTRAST TECHNIQUE: Contiguous axial images were obtained from the base of the skull through the vertex without intravenous contrast. COMPARISON:  MRI 11/04/2013 FINDINGS: Moderate atrophy. Diffuse ventricular enlargement unchanged from the prior MRI. This may be due to communicating hydrocephalus versus atrophy. Ill-defined hypodensity in the high right parietal lobe over the convexity. This is most likely volume averaging of a prominent sulcus which appears similar to the prior MRI. Acute infarct considered less likely given the appearance. Otherwise no acute infarct. Negative for hemorrhage or mass lesion. IMPRESSION: Generalized atrophy with moderate ventricular enlargement, stable from the prior MRI 11/04/2013. Ventricular enlargement raises the possibility of communicating hydrocephalus versus atrophy Hypodensity high right frontal lobe most likely volume averaging of a prominent sulcus. Electronically Signed   By: Marlan Palau M.D.   On: 03/05/2016 16:31    EKG: Personally reviewed showing sinus tachycardia.  Assessment/Plan Active Problems:   CVA (cerebral infarction)   This is a 50 year old gentleman who comes from home after being found confused, disoriented and with left-sided hemiparesis, his abdomen is soft and nontender Patient has had a CVA in the past affecting the same region. He does have carotid artery stenosis status post stents on the right and known occlusion of the left. Currently he has  been taking dual antiplatelet therapy with aspirin and Plavix. He abuses from tobacco and alcohol. On his physical examination clearly he does have left-sided hemiparesis. His blood pressure systolic is 132 to 130, his heart rate is 120, his respiratory rate is 15-23, oxygen saturation about 100%. His sodium is 127, potassium 3.5, creatinine 1.19, calcium 8.5, glucose 223, troponin 0.07 with a CPK 511 lactic acid 1.6, his white count is 13.2, hemoglobin is down to 4.8 with a hematocrit of 14.8 with a platelet count of 192. His urinalysis is negative for infection, his fecal occult is positive for blood. His CT of the head is negative for acute infarct or bleed.  Patient will be admitted to hospital with working diagnosis of acute blood loss anemia due to suspected GI bleed, to but acute ischemic CVA, hyponatremia, acute kidney injury.  1. Cardiovascular. Patient will be transfused packed red blood cells. Emergency department has ordered 3 units. Old records personally reviewed showing  echocardiogram with left ventricle ejection fraction 50%. Will be cautious not to produce volume overload. We'll hold all antihypertensive agents to avoid hypotension. Hold antiplatelet therapy, apparently has been more than a year that he had his cardiac stents.  2. Pulmonary. Continue to monitor oximetry, supplemental oxygen per nasal cannula, nothing by mouth, aspiration precautions. Avoid volume overload.  3. Nephrology. Patient has developed an acute kidney injury probably related to his acute anemia complicated by hyponatremia. After blood transfusion will start gentle hydration with isotonic saline at 75 g per hour. Continue close follow-up on kidney function and electrolytes. Avoid hypotension, further anemia or nephrotoxic agents.  4. Gastrointestinal. There is very high pretest probability for an upper GI bleed. Patient will be placed on proton pump 40 inh drip, nothing by mouth, blood transfusion. After  transfusion will continue check hemoglobin and hematocrit every 6 hours. Gastrointestinal service has been consulted for a prompt endoscopic procedure.  5. Neurology. Patient has stroke  symptoms, will continue supportive care, correction of hemoglobin, avoid hypotension. All anticoagulation at this point is contraindicated due to acute blood loss anemia. After anemia is corrected and bleeding addressed, patient will continue workup for stroke including ultrasonography of carotids, further brain imaging. Will consult neurology for further recommendations. For now will hold on any benzodiazepines due to recent stroke, patient will have neuro checks every 2 hours and depending on patient's condition will address any withdrawal symptoms, if present.  6. Prophylaxis mechanical compression devices.  Patient is critically ill with the profound anemia and in organ damage including acute kidney injury and CVA. Continue medical care to avoid imminent deterioration.  Critical care time 60 minutes  DVT prophylaxis:  Mechanical compression devices Code Status: Full Family Communication: Spoke with patient's friend/fianc at bedside all questions were addressed Disposition Plan: Admit to personal care unit Consults called: Neurology and gastrointestinal Admission status: Inpatient   Mauricio Annett Gula MD [facility] Pager [phone]  If 7PM-7AM, please contact night-coverage [email or web address] Password [facility]  03/05/2016, 6:49 PM

## 2016-03-05 NOTE — ED Notes (Signed)
EDP made aware of patient's increased temperature. She reports to continue the blood and treat with rectal tylenol.

## 2016-03-06 ENCOUNTER — Inpatient Hospital Stay (HOSPITAL_COMMUNITY): Payer: Medicaid Other

## 2016-03-06 ENCOUNTER — Encounter (HOSPITAL_COMMUNITY): Payer: Self-pay | Admitting: Physician Assistant

## 2016-03-06 DIAGNOSIS — L8990 Pressure ulcer of unspecified site, unspecified stage: Secondary | ICD-10-CM | POA: Insufficient documentation

## 2016-03-06 DIAGNOSIS — R740 Nonspecific elevation of levels of transaminase and lactic acid dehydrogenase [LDH]: Secondary | ICD-10-CM

## 2016-03-06 DIAGNOSIS — G9340 Encephalopathy, unspecified: Secondary | ICD-10-CM | POA: Diagnosis present

## 2016-03-06 DIAGNOSIS — I63031 Cerebral infarction due to thrombosis of right carotid artery: Secondary | ICD-10-CM

## 2016-03-06 DIAGNOSIS — K921 Melena: Secondary | ICD-10-CM

## 2016-03-06 DIAGNOSIS — R4182 Altered mental status, unspecified: Secondary | ICD-10-CM

## 2016-03-06 LAB — COMPREHENSIVE METABOLIC PANEL
ALT: 544 U/L — ABNORMAL HIGH (ref 17–63)
ANION GAP: 10 (ref 5–15)
AST: 780 U/L — ABNORMAL HIGH (ref 15–41)
Albumin: 2.7 g/dL — ABNORMAL LOW (ref 3.5–5.0)
Alkaline Phosphatase: 65 U/L (ref 38–126)
BUN: 32 mg/dL — ABNORMAL HIGH (ref 6–20)
CHLORIDE: 100 mmol/L — AB (ref 101–111)
CO2: 24 mmol/L (ref 22–32)
Calcium: 8.2 mg/dL — ABNORMAL LOW (ref 8.9–10.3)
Creatinine, Ser: 0.81 mg/dL (ref 0.61–1.24)
GFR calc non Af Amer: >60 mL/min (ref >60)
Glucose, Bld: 165 mg/dL — ABNORMAL HIGH (ref 65–99)
Potassium: 2.9 mmol/L — ABNORMAL LOW (ref 3.5–5.1)
SODIUM: 134 mmol/L — AB (ref 135–145)
Total Bilirubin: 1.9 mg/dL — ABNORMAL HIGH (ref 0.3–1.2)
Total Protein: 5.4 g/dL — ABNORMAL LOW (ref 6.5–8.1)

## 2016-03-06 LAB — HEMOGLOBIN AND HEMATOCRIT, BLOOD
HCT: 22 % — ABNORMAL LOW (ref 39.0–52.0)
HEMATOCRIT: 21.5 % — AB (ref 39.0–52.0)
HEMOGLOBIN: 7.1 g/dL — AB (ref 13.0–17.0)
HEMOGLOBIN: 7.2 g/dL — AB (ref 13.0–17.0)

## 2016-03-06 LAB — CBC
HCT: 22.2 % — ABNORMAL LOW (ref 39.0–52.0)
HEMOGLOBIN: 7.4 g/dL — AB (ref 13.0–17.0)
MCH: 30.1 pg (ref 26.0–34.0)
MCHC: 33.3 g/dL (ref 30.0–36.0)
MCV: 90.2 fL (ref 78.0–100.0)
Platelets: 158 10*3/uL (ref 150–400)
RBC: 2.46 MIL/uL — AB (ref 4.22–5.81)
RDW: 16.2 % — ABNORMAL HIGH (ref 11.5–15.5)
WBC: 14.3 10*3/uL — AB (ref 4.0–10.5)

## 2016-03-06 LAB — HEPATIC FUNCTION PANEL
ALBUMIN: 2.6 g/dL — AB (ref 3.5–5.0)
ALT: 816 U/L — ABNORMAL HIGH (ref 17–63)
AST: 1188 U/L — ABNORMAL HIGH (ref 15–41)
Alkaline Phosphatase: 68 U/L (ref 38–126)
BILIRUBIN TOTAL: 1.4 mg/dL — AB (ref 0.3–1.2)
Bilirubin, Direct: 0.5 mg/dL (ref 0.1–0.5)
Indirect Bilirubin: 0.9 mg/dL (ref 0.3–0.9)
Total Protein: 5.3 g/dL — ABNORMAL LOW (ref 6.5–8.1)

## 2016-03-06 LAB — LIPID PANEL
CHOL/HDL RATIO: 2.7 ratio
Cholesterol: 74 mg/dL (ref 0–200)
HDL: 27 mg/dL — ABNORMAL LOW (ref >40)
LDL Cholesterol: 37 mg/dL (ref 0–99)
Triglycerides: 51 mg/dL (ref <150)
VLDL: 10 mg/dL (ref 0–40)

## 2016-03-06 LAB — AMMONIA: AMMONIA: 13 umol/L (ref 9–35)

## 2016-03-06 LAB — PROTIME-INR
INR: 1.22 (ref 0.00–1.49)
PROTHROMBIN TIME: 15.6 s — AB (ref 11.6–15.2)

## 2016-03-06 MED ORDER — LORAZEPAM 2 MG/ML IJ SOLN
INTRAMUSCULAR | Status: AC
  Filled 2016-03-06: qty 1

## 2016-03-06 MED ORDER — HYDRALAZINE HCL 20 MG/ML IJ SOLN
5.0000 mg | Freq: Once | INTRAMUSCULAR | Status: AC
  Administered 2016-03-06: 5 mg via INTRAVENOUS
  Filled 2016-03-06: qty 1

## 2016-03-06 MED ORDER — PANTOPRAZOLE SODIUM 40 MG IV SOLR
40.0000 mg | Freq: Two times a day (BID) | INTRAVENOUS | Status: DC
  Administered 2016-03-06 – 2016-03-10 (×9): 40 mg via INTRAVENOUS
  Filled 2016-03-06 (×9): qty 40

## 2016-03-06 MED ORDER — IOPAMIDOL (ISOVUE-370) INJECTION 76%
INTRAVENOUS | Status: AC
  Administered 2016-03-06: 50 mL
  Filled 2016-03-06: qty 50

## 2016-03-06 MED ORDER — CETYLPYRIDINIUM CHLORIDE 0.05 % MT LIQD
7.0000 mL | Freq: Two times a day (BID) | OROMUCOSAL | Status: DC
  Administered 2016-03-07 – 2016-03-10 (×7): 7 mL via OROMUCOSAL

## 2016-03-06 MED ORDER — CHLORHEXIDINE GLUCONATE 0.12 % MT SOLN
15.0000 mL | Freq: Two times a day (BID) | OROMUCOSAL | Status: DC
  Administered 2016-03-06 – 2016-03-11 (×10): 15 mL via OROMUCOSAL
  Filled 2016-03-06 (×10): qty 15

## 2016-03-06 MED ORDER — POTASSIUM CHLORIDE 10 MEQ/100ML IV SOLN
10.0000 meq | INTRAVENOUS | Status: AC
  Administered 2016-03-06 (×3): 10 meq via INTRAVENOUS
  Filled 2016-03-06 (×3): qty 100

## 2016-03-06 MED ORDER — LORAZEPAM 2 MG/ML IJ SOLN
1.0000 mg | Freq: Once | INTRAMUSCULAR | Status: AC
  Administered 2016-03-06: 1 mg via INTRAVENOUS

## 2016-03-06 NOTE — Progress Notes (Signed)
STROKE TEAM PROGRESS NOTE   HISTORY OF PRESENT ILLNESS (per record) Brandon [name] is an 50 y.o. male who presents with acute onset of left hemiplegia. He was last seen normal by his wife at 3 PM yesterday 03/04/2016, and last heard to be normal by phone at midnight. Today 03/05/2016, his son found him at home immobile on the couch covered in urine and stool. The patient was transported by EMS to the ED and exam revealed dense left sided weakness. He was out of the IV tPA and endovascular/interventional time windows. He was unable to get up due to feeling "dizzy" and felt as though he would fall when he "tried to walk". He has mild left sided residual deficits from prior stroke. Per EMS, when they tried to sit him up he lost consciousness and was incontinent again of stool. Patient was not administered IV t-PA secondary to delay in arrival. He was admitted to the stepdown unit for further evaluation and treatment.   SUBJECTIVE (INTERVAL HISTORY) No family is at the bedside.  Overall he feels his condition is stable. He reports L hemiparesis, no problems with speech. Patient reports HPI - his story seems clear, ? Patient's actual mentation during event.   OBJECTIVE Temp:  [97.9 F (36.6 C)-100.2 F (37.9 C)] 98.4 F (36.9 C) (06/14 0818) Pulse Rate:  [94-123] 106 (06/14 0818) Cardiac Rhythm:  [-] Sinus tachycardia (06/13 2333) Resp:  [11-29] 20 (06/14 0818) BP: (121-172)/(68-118) 153/89 mmHg (06/14 0818) SpO2:  [97 %-100 %] 99 % (06/14 0818) Weight:  [87.3 kg (192 lb 7.4 oz)] 87.3 kg (192 lb 7.4 oz) (06/13 2333)  CBC:   Recent Labs Lab 03/05/16 1555 03/06/16 0529  WBC 13.2* 14.3*  NEUTROABS 11.5*  --   HGB 4.8* 7.4*  HCT 14.8* 22.2*  MCV 92.5 90.2  PLT 192 158    Basic Metabolic Panel:   Recent Labs Lab 03/05/16 1555 03/06/16 0529  NA 127* 134*  K 3.5 2.9*  CL 92* 100*  CO2 23 24  GLUCOSE 223* 165*  BUN 66* 32*  CREATININE 1.19 0.81  CALCIUM 8.5* 8.2*  MG 2.3  --      Lipid Panel:     Component Value Date/Time   CHOL 177 11/25/2012 1658   TRIG 82 11/25/2012 1658   HDL 72 11/25/2012 1658   CHOLHDL 2.5 11/25/2012 1658   VLDL 16 11/25/2012 1658   LDLCALC 89 11/25/2012 1658   HgbA1c:  Lab Results  Component Value Date   HGBA1C 5.8* 06/11/2012   Urine Drug Screen:     Component Value Date/Time   LABOPIA NONE DETECTED 06/11/2012 1126   COCAINSCRNUR NONE DETECTED 06/11/2012 1126   LABBENZ NONE DETECTED 06/11/2012 1126   AMPHETMU NONE DETECTED 06/11/2012 1126   THCU NONE DETECTED 06/11/2012 1126   LABBARB NONE DETECTED 06/11/2012 1126      IMAGING  Ct Head Wo Contrast  03/05/2016  CLINICAL DATA:  Left hemi neglect EXAM: CT HEAD WITHOUT CONTRAST TECHNIQUE: Contiguous axial images were obtained from the base of the skull through the vertex without intravenous contrast. COMPARISON:  MRI 11/04/2013 FINDINGS: Moderate atrophy. Diffuse ventricular enlargement unchanged from the prior MRI. This may be due to communicating hydrocephalus versus atrophy. Ill-defined hypodensity in the high right parietal lobe over the convexity. This is most likely volume averaging of a prominent sulcus which appears similar to the prior MRI. Acute infarct considered less likely given the appearance. Otherwise no acute infarct. Negative for hemorrhage or mass lesion.  IMPRESSION: Generalized atrophy with moderate ventricular enlargement, stable from the prior MRI 11/04/2013. Ventricular enlargement raises the possibility of communicating hydrocephalus versus atrophy Hypodensity high right frontal lobe most likely volume averaging of a prominent sulcus. Electronically Signed   By: Marlan Palau M.D.   On: 03/05/2016 16:31       PHYSICAL EXAM Middle aged Caucasian male not in distress. . Afebrile. Head is nontraumatic. Neck is supple without bruit.    Cardiac exam no murmur or gallop. Lungs are clear to auscultation. Distal pulses are well felt. Neurological Exam :  Awake  alert oriented to place and person. Diminished attention, registration and recall. Speech is clear without dysarthria. No aphasia. Right gaze preference but able to look to the left past midline. No blink to threat on the right but not on the left suspect left common and was hemianopsia. Mild left lower facial weakness. Tongue midline. Motor system exam reveals left hemiparesis with grade 1-2/5 in the left hand and 3/5 in the left lower extremity. Normal strength on the right. Decreased left hemibody touch and pinprick sensation. Reflexes brisk on the left compared to the right. Left plantar upgoing right downgoing. ASSESSMENT/PLAN [name] is a 50 y.o. male with history of ischemic heart disease and CVA presenting with L sided weakness. He did not receive IV t-PA due to delay in arrival.   New R brain Stroke due to failure of collaterals in setting of GIB/anemia vs seziures vs worsening of old symptoms  No tongue bite  Resultant  L hemiparesis, field cut senosry loss  MRI  ordered  CTA head & neck   ordered  2D Echo  ordered  EEG ordered  LDL add on request  HgbA1c pending  SCDs for VTE prophylaxis Diet NPO time specified  aspirin 81 mg daily and clopidogrel 75 mg daily prior to admission though patient admits he was not taking as routinely as he should, now on No antithrombotic due to anemia.   Ongoing aggressive stroke risk factor management  Therapy recommendations:  pending   Disposition:  pending   Hypertension  Stable  Permissive hypertension (OK if < 220/120) but gradually normalize in 5-7 days  Long-term BP goal normotensive  Hyperlipidemia  Home meds:  zocor 20  LDL added, goal < 70  zocor on hold due to elevated CK  Resume statin when able  Continue statin at discharge  Other Stroke Risk Factors  Cigarette smoker, advised to stop smoking  ETOH abuse, advised to drink no more than 2 drinks a day  Hx stroke/TIA  05/2012 R parietal and  insular infarct, embolic d/t R ICA occlusion, incidental L ICA dissection  L ICA stent by Dr. Festus Barren in [facility] 04/06/15  Family hx stroke (mother)  Coronary artery disease  Other Active Problems  Anemia. HGB 4.8, transfused w/ 3 units, possible GIB  AKI  Hypokalemia 2.9, replaced  Hospital day # 1  Rhoderick Moody [facility] See Amion for Pager information 03/06/2016 8:46 AM  I have personally examined this patient, reviewed notes, independently viewed imaging studies, participated in medical decision making and plan of care. I have made any additions or clarifications directly to the above note. Agree with note above.  He presented with an Onset Left Hemiparesis Due To Right hemispheric Infarct Due To Failure of Collaterals Circulation in a Patient with Known Chronic Rt ICA Occlusion. He Remains at Risk for Neurological Worsening, Recurrent Stroke, TIA and Needs Ongoing Stroke Evaluation and Aggressive  Risk Factor Modification. He Also Had What Appears to Be an Acute GI Bleed with Low Hematocrit Requiring Transfusion.  Plan we'll check CT angiogram of the brain and neck, echocardiogram as well as an EEG for possible seizure activity Greater than 50% time during this 35 minute visit was spent on counseling and coordination of care about stroke risk and prevention  Delia HeadyPramod Dameer Speiser, MD Medical Director [facility] Pager: [phone] 03/06/2016 1:10 PM    To contact Stroke Continuity provider, please refer to [email or web address]. After hours, contact General Neurology

## 2016-03-06 NOTE — Progress Notes (Signed)
PROGRESS NOTE    Brandon [name]  ZOX:[id number] DOB: 1966/01/28 DOA: 03/05/2016 PCP: Laurier NancyKHAN,SHAUKAT A, MD (Confirm with patient/family/NH records and if not entered, [facility]. "No PCP" if truly none.)   Brief Narrative: (Start on day 1 of progress note - keep it brief and live) Acute blood loss anemia due to upper GI bleed complicated with acute CVA, 50 yo male with carotid stenosis and ischemic heart disease. Persistent left hemiparesis, no further bleeding, had 3 units prbc transfused.   Assessment & Plan:   Active Problems:   CVA (cerebral infarction)   Pressure ulcer   Altered mental status   1. Cardiovascular. Patient hemodynamic stable, tolerated well 3 units prbc transfused, will continue hydration with saline at 75 cc/hr. No signs of active bleeding.  2,. Pulmonary. Will continue to monitor oxymetry, aspiration precautions, supplemental 02 per Leon to target 02 sat above 92%, no signs of volume overload.  3. Nephrology, Renal function with cr at 0.8 from 1,1 with Na at 134 and K at 2,9, will replete K and follow renal panel in am, will continue to avoid hypotension and nephrotoxic meds, will continue hydration with saline at 75 cc/hr. Note cl at 100 and serum bicarb at 24.  4, Neruology. Case discussed with neurology, will continue supportive care, avoid worsening anemia, will follow on further brain imaging. No anticoagulation indicated. Follow on EEG  5. GI suspected upper gi bleed, no signs of bleeding today, hb and hct stable will continue with ppi IV. Follow with gi recommendations.  6. dvt px  Patient continue to be at high risk for worsening anemia and cva.    DVT prophylaxis: (Lovenox/Heparin/SCD's/anticoagulated/None (if comfort care) Code Status: (Full/Partial - specify details) Family Communication: (Specify name, relationship & date discussed. NO "discussed with patient") Disposition Plan: (specify when and where you expect  patient to be discharged). Include barriers to DC in this tab.   Consultants:   Neurology  Gi  Procedures: (Don't include imaging studies which can be auto populated. Include things that cannot be auto populated i.e. Echo, Carotid and venous dopplers, Foley, Bipap, HD, tubes/drains, wound vac, central lines etc)    Antimicrobials: (specify start and planned stop date. Auto populated tables are space occupying and do not give end dates)     Subjective: Patient responds to simple questions, not in pain or dyspnea, continue NPO.   Objective: Filed Vitals:   03/06/16 0330 03/06/16 0356 03/06/16 0818 03/06/16 1237  BP: 158/80  153/89 176/78  Pulse:   106 98  Temp: 97.9 F (36.6 C) 98.9 F (37.2 C) 98.4 F (36.9 C) 98.4 F (36.9 C)  TempSrc: Oral Oral Oral Oral  Resp:   20 17  Height:      Weight:      SpO2:   99% 99%    Intake/Output Summary (Last 24 hours) at 03/06/16 1713 Last data filed at 03/06/16 1239  Gross per 24 hour  Intake    390 ml  Output   1775 ml  Net  -1385 ml   Filed Weights   03/05/16 2333  Weight: 87.3 kg (192 lb 7.4 oz)    Examination:  General exam: deconditioned and ill looking appaering E ENT: positive conjunctival pallor, oral mucosa moist.  Respiratory system:. Respiratory effort normal.Mild decrease breath sounds at bases, no wheezing, rales or rhonchi.  Cardiovascular system: S1 & S2 heard, tachycardic, RRR. No JVD, murmurs, rubs, gallops or clicks. No pedal edema.  Gastrointestinal system: Abdomen is nondistended, soft and nontender. No organomegaly or masses felt. Normal bowel sounds heard. Central nervous system: right gaze preference, continue with severe left hemiparesis. Able to follow commands on the right. Patient confused.  Extremities: Symmetric 5 x 5 power. Skin: No rashes, lesions or ulcers    Data Reviewed: I have personally reviewed following labs and imaging studies  CBC:  Recent Labs Lab 03/05/16 1555  03/06/16 0529 03/06/16 0916 03/06/16 1628  WBC 13.2* 14.3*  --   --   NEUTROABS 11.5*  --   --   --   HGB 4.8* 7.4* 7.1* 7.2*  HCT 14.8* 22.2* 21.5* 22.0*  MCV 92.5 90.2  --   --   PLT 192 158  --   --    Basic Metabolic Panel:  Recent Labs Lab 03/05/16 1555 03/06/16 0529  NA 127* 134*  K 3.5 2.9*  CL 92* 100*  CO2 23 24  GLUCOSE 223* 165*  BUN 66* 32*  CREATININE 1.19 0.81  CALCIUM 8.5* 8.2*  MG 2.3  --    GFR: Estimated Creatinine Clearance: 121.9 mL/min (by C-G formula based on Cr of 0.81). Liver Function Tests:  Recent Labs Lab 03/06/16 0529  AST 780*  ALT 544*  ALKPHOS 65  BILITOT 1.9*  PROT 5.4*  ALBUMIN 2.7*   No results for input(s): LIPASE, AMYLASE in the last 168 hours. No results for input(s): AMMONIA in the last 168 hours. Coagulation Profile:  Recent Labs Lab 03/06/16 0529  INR 1.22   Cardiac Enzymes:  Recent Labs Lab 03/05/16 1555  CKTOTAL 511*  TROPONINI 0.07*   BNP (last 3 results) No results for input(s): PROBNP in the last 8760 hours. HbA1C: No results for input(s): HGBA1C in the last 72 hours. CBG: No results for input(s): GLUCAP in the last 168 hours. Lipid Profile:  Recent Labs  03/06/16 0916  CHOL 74  HDL 27*  LDLCALC 37  TRIG 51  CHOLHDL 2.7   Thyroid Function Tests: No results for input(s): TSH, T4TOTAL, FREET4, T3FREE, THYROIDAB in the last 72 hours. Anemia Panel: No results for input(s): VITAMINB12, FOLATE, FERRITIN, TIBC, IRON, RETICCTPCT in the last 72 hours. Sepsis Labs:  Recent Labs Lab 03/05/16 1555 03/05/16 1742  LATICACIDVEN 1.6 1.5    No results found for this or any previous visit (from the past 240 hour(s)).       Radiology Studies: Ct Angio Head W/cm &/or Wo Cm  03/06/2016  CLINICAL DATA:  Stroke EXAM: CT ANGIOGRAPHY HEAD AND NECK TECHNIQUE: Multidetector CT imaging of the head and neck was performed using the standard protocol during bolus administration of intravenous contrast.  Multiplanar CT image reconstructions and MIPs were obtained to evaluate the vascular anatomy. Carotid stenosis measurements (when applicable) are obtained utilizing NASCET criteria, using the distal internal carotid diameter as the denominator. CONTRAST:  50 mL Isovue 370 IV COMPARISON:  CT head 03/05/2016.  MRI 11/04/2013 and 06/11/2012 FINDINGS: Suboptimal arterial opacification due to timing of the injection. CTA NECK Aortic arch: Minimal atherosclerotic disease in the aortic arch. Proximal great vessels patent without significant stenosis. Lung apices clear.  No mass or adenopathy in the upper mediastinum. Right carotid system: Right common carotid artery widely patent. Atherosclerotic calcification at the bifurcation. 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 This appears chronic and was present in 2013. Left carotid system: Left common carotid artery widely patent. Atherosclerotic disease in the distal common carotid artery. Metal stent across the carotid bifurcation. The  stent is patent. Vertebral arteries:Proximal right vertebral artery is occluded with reconstitution in the mid neck. Moderate stenosis distal right vertebral artery. Left vertebral artery is patent to the basilar with scattered atherosclerotic disease. Skeleton: Cervical spondylosis.  No fracture or bone lesion. Other neck: No mass or adenopathy in the neck. Mucosal edema paranasal sinuses. CTA HEAD Anterior circulation: Right cavernous carotid is occluded which is chronic. There is reconstitution distally above the clinoid. Atherosclerotic calcification in the left cavernous carotid with mild stenosis. Anterior and middle cerebral arteries are poorly evaluated due to suboptimal opacification. These vessels appear patent Posterior circulation: Both vertebral arteries contribute to the basilar. PICA patent. Basilar patent. Posterior cerebral arteries patent bilaterally but not well evaluated due to poor opacification Venous  sinuses: Patent Anatomic variants: None Delayed phase: Normal enhancement. Hypodensity in the high right parietal lobe as noted previously. This is slightly more prominent compared with yesterday and may represent subacute infarction. Negative for hemorrhage. IMPRESSION: Unfortunately, there is suboptimal arterial opacification due to timing of the injection. 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 Occlusion proximal right vertebral artery which reconstitutes in the mid neck. High right frontal cortical hypodensity is more prominent today and may represent subacute infarct Electronically Signed   By: Marlan Palau M.D.   On: 03/06/2016 12:24   Ct Head Wo Contrast  03/05/2016  CLINICAL DATA:  Left hemi neglect EXAM: CT HEAD WITHOUT CONTRAST TECHNIQUE: Contiguous axial images were obtained from the base of the skull through the vertex without intravenous contrast. COMPARISON:  MRI 11/04/2013 FINDINGS: Moderate atrophy. Diffuse ventricular enlargement unchanged from the prior MRI. This may be due to communicating hydrocephalus versus atrophy. Ill-defined hypodensity in the high right parietal lobe over the convexity. This is most likely volume averaging of a prominent sulcus which appears similar to the prior MRI. Acute infarct considered less likely given the appearance. Otherwise no acute infarct. Negative for hemorrhage or mass lesion. IMPRESSION: Generalized atrophy with moderate ventricular enlargement, stable from the prior MRI 11/04/2013. Ventricular enlargement raises the possibility of communicating hydrocephalus versus atrophy Hypodensity high right frontal lobe most likely volume averaging of a prominent sulcus. Electronically Signed   By: Marlan Palau M.D.   On: 03/05/2016 16:31   Ct Angio Neck W/cm &/or Wo/cm  03/06/2016  CLINICAL DATA:  Stroke EXAM: CT ANGIOGRAPHY HEAD AND NECK TECHNIQUE: Multidetector CT imaging of the head and neck was performed using  the standard protocol during bolus administration of intravenous contrast. Multiplanar CT image reconstructions and MIPs were obtained to evaluate the vascular anatomy. Carotid stenosis measurements (when applicable) are obtained utilizing NASCET criteria, using the distal internal carotid diameter as the denominator. CONTRAST:  50 mL Isovue 370 IV COMPARISON:  CT head 03/05/2016.  MRI 11/04/2013 and 06/11/2012 FINDINGS: Suboptimal arterial opacification due to timing of the injection. CTA NECK Aortic arch: Minimal atherosclerotic disease in the aortic arch. Proximal great vessels patent without significant stenosis. Lung apices clear.  No mass or adenopathy in the upper mediastinum. Right carotid system: Right common carotid artery widely patent. Atherosclerotic calcification at the bifurcation. 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 This appears chronic and was present in 2013. Left carotid system: Left common carotid artery widely patent. Atherosclerotic disease in the distal common carotid artery. Metal stent across the carotid bifurcation. The stent is patent. Vertebral arteries:Proximal right vertebral artery is occluded with reconstitution in the mid neck. Moderate stenosis distal right vertebral artery. Left vertebral artery is  patent to the basilar with scattered atherosclerotic disease. Skeleton: Cervical spondylosis.  No fracture or bone lesion. Other neck: No mass or adenopathy in the neck. Mucosal edema paranasal sinuses. CTA HEAD Anterior circulation: Right cavernous carotid is occluded which is chronic. There is reconstitution distally above the clinoid. Atherosclerotic calcification in the left cavernous carotid with mild stenosis. Anterior and middle cerebral arteries are poorly evaluated due to suboptimal opacification. These vessels appear patent Posterior circulation: Both vertebral arteries contribute to the basilar. PICA patent. Basilar patent. Posterior cerebral arteries  patent bilaterally but not well evaluated due to poor opacification Venous sinuses: Patent Anatomic variants: None Delayed phase: Normal enhancement. Hypodensity in the high right parietal lobe as noted previously. This is slightly more prominent compared with yesterday and may represent subacute infarction. Negative for hemorrhage. IMPRESSION: Unfortunately, there is suboptimal arterial opacification due to timing of the injection. 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 Occlusion proximal right vertebral artery which reconstitutes in the mid neck. High right frontal cortical hypodensity is more prominent today and may represent subacute infarct Electronically Signed   By: Marlan Palau M.D.   On: 03/06/2016 12:24   Mr Brain Wo Contrast  03/06/2016  CLINICAL DATA:  Acute onset of left hemiplegia. Last seen normal 2 days ago. EXAM: MRI HEAD WITHOUT CONTRAST TECHNIQUE: Multiplanar, multiecho pulse sequences of the brain and surrounding structures were obtained without intravenous contrast. COMPARISON:  CT 03/05/2016.  CTA 03/06/2016. FINDINGS: Diffusion imaging and FLAIR axial sequences performed. The study suffers from motion degradation. There are scattered areas of acute infarction in the right middle cerebral artery territory. There are small areas of acute infarction in the right basal ganglia and along the insular cortex. There is a 1-2 cm region of acute infarction in the deep right frontal white matter. There is cortical and subcortical infarction in the right posterior frontal and frontoparietal junction region. There is mild swelling but no mass effect or shift. No hemorrhage is detected on these limited sequences. Elsewhere, the brain shows generalized atrophy. Chronic small-vessel changes of the deep white matter. Ventricular prominence secondary to generalized atrophy. IMPRESSION: Acute infarctions scattered in the right middle cerebral artery territory as  outlined above. Most extensive involvement is at the posterior frontal and frontoparietal junction cortical and subcortical brain. No mass effect or shift. Electronically Signed   By: Paulina Fusi M.D.   On: 03/06/2016 15:59        Scheduled Meds: . pantoprazole (PROTONIX) IV  40 mg Intravenous Q12H  . sodium chloride flush  3 mL Intravenous Q12H   Continuous Infusions: . sodium chloride 1,000 mL (03/06/16 0606)     LOS: 1 day        Syaire Saber Annett Gula, MD [facility] Pager [phone]  If 7PM-7AM, please contact night-coverage [email or web address] Password [facility] 03/06/2016, 5:13 PM

## 2016-03-06 NOTE — Care Management Note (Signed)
Case Management Note  Patient Details  Name: Marcelle SmilingKeith W Gehling MRN: [id number] Date of Birth: April 25, 1966  Subjective/Objective:     CVA, GI bleed               Action/Plan: Discharge Planning: Lives at home with Linus GalasSO, [name] Lester # [phone]. Waiting PT eval for dc needs.   PCP- Laurier NancyKHAN, SHAUKAT A MD  Expected Discharge Date:                  Expected Discharge Plan:  Skilled Nursing Facility  In-House Referral:  Clinical Social Work  Discharge planning Services  CM Consult  Post Acute Care Choice:  NA Choice offered to:  NA  DME Arranged:  N/A DME Agency:  NA  HH Arranged:  NA HH Agency:  NA  Status of Service:  In process, will continue to follow  Medicare Important Message Given:    Date Medicare IM Given:    Medicare IM give by:    Date Additional Medicare IM Given:    Additional Medicare Important Message give by:     If discussed at Long Length of Stay Meetings, dates discussed:    Additional Comments:  Elliot CousinShavis, Tynia Wiers Ellen, RN 03/06/2016, 9:38 AM

## 2016-03-06 NOTE — Progress Notes (Addendum)
Initial Nutrition Assessment  DOCUMENTATION CODES:   Not applicable  INTERVENTION:    Recommend swallow evaluation with SLP (when patient more alert and GI bleeding resolved) prior to advancing diet.  NUTRITION DIAGNOSIS:   Inadequate oral intake related to inability to eat as evidenced by NPO status.  GOAL:   Patient will meet greater than or equal to 90% of their needs  MONITOR:   Diet advancement, PO intake, Skin, I & O's  REASON FOR ASSESSMENT:   Low Braden    ASSESSMENT:   50 year old gentleman who comes from home after being found confused, disoriented and with left-sided hemiparesis, his abdomen is soft and nontender Patient has had a CVA in the past affecting the same region. Urinalysis negative for infection, fecal occult positive for blood. CT of the head negative for acute infarct or bleed.  Labs reviewed: sodium and potassium low, receiving potassium runs this morning. Nutrition-Focused physical exam completed. Findings are no fat depletion, mild-moderate muscle depletion, and no edema.  Patient reports usual weight in the 200's, currently 192 lbs; unsure time frame of weight loss. Suspect malnutrition given mild-moderate muscle depletion, weight loss, and ETOH abuse. Patient remains NPO. Work-up for GIB and possible stroke is ongoing per discussion with RN. Patient will likely need swallow evaluation with SLP (once more alert and GI bleeding resolved) prior to advancing diet.  Diet Order:  Diet NPO time specified  Skin:  Wound (see comment) (stage I pressure injury to top of L foot)  Last BM:  6/13  Height:   Ht Readings from Last 1 Encounters:  03/05/16 6\' 1"  (1.854 m)    Weight:   Wt Readings from Last 1 Encounters:  03/05/16 192 lb 7.4 oz (87.3 kg)    Ideal Body Weight:  83.6 kg  BMI:  Body mass index is 25.4 kg/(m^2).  Estimated Nutritional Needs:   Kcal:  2200-2400  Protein:  110-125 gm  Fluid:  2.2-2.4 L  EDUCATION NEEDS:   No  education needs identified at this time  Joaquin CourtsKimberly Rubi Tooley, RD, LDN, CNSC Pager [phone] After Hours Pager [phone]

## 2016-03-06 NOTE — Consult Note (Signed)
[facility] Consult: 1:21 PM 03/06/2016  LOS: 1 day    Referring Provider: Dr Cathlean Sauer  Primary Care Physician:  Dionisio David, MD:  cardiologist Primary Gastroenterologist:  Althia Forts.     Reason for Consultation:  Anemia and FOBT +   HPI: Brandon [name] is a 50 y.o. male.  Hx CAD.  Unstable angina 02/2015: cardiac cath with PCI/DES to RCA: on Plavix and 81 ASA chronically.   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  Bilateral carotid artery disease.  S/p 03/2015 left carotid artery stent.  Hx acute and chronic alcoholic pancreatitis, still drinking.  Past/currrent meds included Creon, Amitiza, Omeprazole though not clear he is taking these.  Hx etoh hepatitis.  Severe alcoholic fatty liver, ascites, pancreatitis, but no pseudocysts on 2011 ultrasound and CT scan.  Hx ETOH withdrawal seizures.  Anxiety. Hx hyperplastic colon polyp 2009.    Underwent cardiac stress test Friday 6/9 at [facility] for c/o fatigue, dyspnea.  Results of test not known.  Transported to ED last night with confusion, disorientation, left hemiplegia (significantly worse than his post CVA sequelae).  Found down on couch covered in urine and stool.  Pt told EMS he had been on couch for 2 days, was too dizzy to get up off couch.  However there is a telephone note dated 6/12 by [facility] (neuro) stating pt request for Tramadol refill, he uses it for headaches.  Denies use of NSAIDs.  Has had multiple falls in recent days. Last was on Monday, sustained abrasion to left knee, it bled a lot.  Does not endorse large bruising anywhere on body.  Stools loose, dark.  Test FOBT +.  Pt denies n/v, reflux sxs, dysphagia, melena, BPR, weight loss, abdominal pain.  Consumes 1/2 gallon vodka (mixes with [employer]) in a weeks time.   Hgb 4.8, transfused x 3 PRBCs, and  now 7.1.  MCV 90.  Hgb was 12.5 in 02/2015.     Coags ok. FOBT +.  No tox screen.  Na 134, K 2.9.   AST/ALT 780/544.  T bili 1.9.  Alk phos 65.  Neither Lipase nor ammonia level assayed.  CK 511, Troponin I 0.07.   Normal lactic acid.  Glucose to 223 (no previous dx of DM)  BUN elevated 66, creatinine normal at 1.1.   CT Head: Atrophy, ventricular enlargement raising question of communicating hydrocephalus versus atrophy CT angio head, neck: suboptimal opacification, chronic right carotid artery occlusion, left carotid stent,. Occlusion proximal right vertebral artery which reconstitutes in the mid neck.  High right frontal cortical hypodensity is more prominent today and may represent subacute infarct. EEG and MRI ordered.     Past Medical History  Diagnosis Date  . Alcohol abuse   . Pancreatitis     CT findings in May 2011 with inflammation and pseudocyst  . GERD (gastroesophageal reflux disease)   . CAD (coronary artery disease) 06/13/2012    Calcification noted on CTA of chest in 2012 Wall motion abnormality on ECHO    . CVA due to right ICA occlusion  06/13/2012    with residual left-sided weakness   . Hyperlipidemia   . Headache(784.0)     migraine  . Heart disease   . Depression   . Anxiety   . Hypertension   . Carotid artery disease [facility])     Past Surgical History  Procedure Laterality Date  . None    . Carotid angiogram N/A 06/15/2012    Procedure: CAROTID ANGIOGRAM;  Surgeon: Angelia Mould, MD;  Location: [facility] CATH LAB;  Service: Cardiovascular;  Laterality: N/A;  . Cardiac catheterization N/A 03/15/2015    Procedure: Left Heart Cath;  Surgeon: Dionisio David, MD;  Location: [place] CV LAB;  Service: Cardiovascular;  Laterality: N/A;  . Cardiac catheterization N/A 03/16/2015    Procedure: Coronary Stent Intervention;  Surgeon: Yolonda Kida, MD;  Location: [place] CV LAB;  Service: Cardiovascular;  Laterality: N/A;  . Peripheral vascular  catheterization Left 04/06/2015    Procedure: Carotid PTA/Stent Intervention;  Surgeon: Algernon Huxley, MD;  Location: [place] CV LAB;  Service: Cardiovascular;  Laterality: Left;    Prior to Admission medications   Medication Sig Start Date End Date Taking? Authorizing Provider  albuterol (PROAIR HFA) 108 (90 Base) MCG/ACT inhaler Inhale 2 puffs into the lungs every 6 (six) hours as needed for wheezing or shortness of breath.    Yes Historical Provider, MD  albuterol (PROVENTIL) (2.5 MG/3ML) 0.083% nebulizer solution Take 2.5 mg by nebulization as needed for wheezing or shortness of breath.   Yes Historical Provider, MD  amLODipine (NORVASC) 10 MG tablet Take 1 tablet (10 mg total) by mouth once. Patient taking differently: Take 10 mg by mouth daily.  03/17/15  Yes Barnabas Harries, PA-C  aspirin 81 MG tablet Take 81 mg by mouth every morning.    Yes Historical Provider, MD  B Complex Vitamins (VITAMIN-B COMPLEX PO) Take 1 tablet by mouth daily.    Yes Historical Provider, MD  buPROPion (WELLBUTRIN SR) 150 MG 12 hr tablet Start 1 in the am for 3 days then 1 tab twice daily. 08/04/14  Yes Philmore Pali, NP  busPIRone (BUSPAR) 10 MG tablet Take 10 mg by mouth 2 (two) times daily.   Yes Historical Provider, MD  clopidogrel (PLAVIX) 75 MG tablet Take 1 tablet (75 mg total) by mouth daily with breakfast. 03/17/15  Yes Barnabas Harries, PA-C  cyclobenzaprine (FLEXERIL) 10 MG tablet Take 1 tablet (10 mg total) by mouth 3 (three) times daily as needed for muscle spasms. 09/20/15  Yes Garvin Fila, MD  folic acid (FOLVITE) 1 MG tablet Take 1 mg by mouth daily.     Yes Historical Provider, MD  GARLIC OIL 0315 PO Take 1,500 mg by mouth daily.    Yes Historical Provider, MD  hydrochlorothiazide (MICROZIDE) 12.5 MG capsule Take 12.5 mg by mouth daily.   Yes Historical Provider, MD  lisinopril (PRINIVIL,ZESTRIL) 10 MG tablet Take 10 mg by mouth daily.   Yes Historical Provider, MD  lubiprostone (AMITIZA) 24 MCG  capsule Take 24 mcg by mouth as needed for constipation.   Yes Historical Provider, MD  metoprolol (LOPRESSOR) 50 MG tablet Take 50 mg by mouth daily.   Yes Historical Provider, MD  Multiple Vitamin (MULTIVITAMIN) tablet Take 1 tablet by mouth daily.   Yes Historical Provider, MD  Omega-3 Fatty Acids (FISH OIL PO) Take 1 capsule by mouth daily.   Yes Historical Provider, MD  omeprazole (PRILOSEC) 20 MG capsule Take 20 mg by mouth  daily.   Yes Historical Provider, MD  Pancrelipase, Lip-Prot-Amyl, (ZENPEP) 25000 UNITS CPEP Take 1-2 capsules by mouth 3 (three) times daily with meals.    Yes Historical Provider, MD  simvastatin (ZOCOR) 20 MG tablet Take 20 mg by mouth daily at 6 PM.    Yes Historical Provider, MD  tiotropium (SPIRIVA HANDIHALER) 18 MCG inhalation capsule Place 18 mcg into inhaler and inhale daily.    Yes Historical Provider, MD  traMADol (ULTRAM) 50 MG tablet Take 1-2 tablets (50-100 mg total) by mouth every 6 (six) hours as needed. for pain 11/14/15  Yes Kathrynn Ducking, MD  traZODone (DESYREL) 150 MG tablet Take 1 tablet (150 mg total) by mouth at bedtime. 08/23/13  Yes Jerrye Noble, MD    Scheduled Meds: . sodium chloride flush  3 mL Intravenous Q12H   Infusions: . sodium chloride 1,000 mL (03/06/16 0606)   PRN Meds: morphine injection, ondansetron (ZOFRAN) IV   Allergies as of 03/05/2016 - Review Complete 03/05/2016  Allergen Reaction Noted  . No known allergies  04/06/2015    Family History  Problem Relation Age of Onset  . Stroke Mother     deceased  . Coronary artery disease Mother   . Alcohol abuse Mother   . Cancer Mother   . Hypertension Father     alive  . Alcohol abuse Father   . Diabetes Father   . Kidney disease Father     Social History   Social History  . Marital Status: Single    Spouse Name: [name]  . Number of Children: 0  . Years of Education: 12   Occupational History  . unemployed     previously worked at [employer]  . Smoking status: Current Every Day Smoker -- 1.00 packs/day for 30 years    Types: Cigarettes    Last Attempt to Quit: 04/12/2015  . Smokeless tobacco: Never Used     Comment: smoking less  . Alcohol Use: 3.6 oz/week    6 Cans of beer per week     Comment: drinks 6-12 beer daily  . Drug Use: No  . Sexual Activity: Yes    Birth Control/ Protection: None   Other Topics Concern  . Not on file   Social History Narrative   Lives in [place] with his sister.   Unemployed, previously worked Investment banker, operational down [employer]   Completed high school and some college    Patient is right-handed.   Patients one cup of coffee every morning.          REVIEW OF SYSTEMS: Constitutional:  Per HPI.  Weakness in recent days ENT:  No nose bleeds Pulm:  No cough or dyspnea.   CV:  No palpitations, no LE edema.  GU:  No hematuria, no frequency GI:  No dysphagia, no choking/coughing on sputum or with po.  Heme:  Per HPI   Transfusions:  None per his recall Neuro:  No headaches, no peripheral tingling or numbness Derm:  No itching, no rash or sores.  Endocrine:  No sweats or chills.  No polyuria or dysuria Immunization:  Not queried.  Travel:  None beyond local counties in last few months.    PHYSICAL EXAM: Vital signs in last 24 hours: Filed Vitals:   03/06/16 0818 03/06/16 1237  BP: 153/89 176/78  Pulse: 106 98  Temp: 98.4 F (36.9 C) 98.4 F (36.9 C)  Resp: 20 17   Wt Readings from  Last 3 Encounters:  03/05/16 87.3 kg (192 lb 7.4 oz)  04/12/15 92.987 kg (205 lb)  04/06/15 89.812 kg (198 lb)    General: alert, pleasant, comfortable.  Does not look ill.  Head:  No swelling.  Symmetric mouth/lips.  Eyes:  No icterus or pallor Ears:  Not HOH  Nose:  No discharge Mouth:  Clear, moist, no blood. Slight drop to left side of mouth but tongue is midline Neck:  No mass, no JVD.  No TMG.   Lungs:  Clear bil.  No SOB, no cough.  Heart: RRR.  No mrg.  S1/S2  present Abdomen:  Soft, NT, ND.  No mass or HSM.   Active BS.  No bruits.   Rectal: dark, brown/black soft BM is strongly FOBT +   Musc/Skeltl: no joint swelling or redness or bruising.  Healing abrasion on left knee Extremities:  No CCE.  Feet warm.   Neurologic:  Oriented to place, year, self.  Fully alert.   Skin:  No rash, no telangectasia.  Healing abrasion left knee Psych:  Cooperative, pleasant, calm.   Intake/Output from previous day: 06/13 0701 - 06/14 0700 In: 390 [I.V.:35; Blood:355] Out: 1025 [Urine:1025] Intake/Output this shift: Total I/O In: -  Out: 750 [Urine:750]  LAB RESULTS:  Recent Labs  03/05/16 1555 03/06/16 0529 03/06/16 0916  WBC 13.2* 14.3*  --   HGB 4.8* 7.4* 7.1*  HCT 14.8* 22.2* 21.5*  PLT 192 158  --    BMET Lab Results  Component Value Date   NA 134* 03/06/2016   NA 127* 03/05/2016   NA 140 04/07/2015   K 2.9* 03/06/2016   K 3.5 03/05/2016   K 3.6 04/07/2015   CL 100* 03/06/2016   CL 92* 03/05/2016   CL 106 04/07/2015   CO2 24 03/06/2016   CO2 23 03/05/2016   CO2 28 04/07/2015   GLUCOSE 165* 03/06/2016   GLUCOSE 223* 03/05/2016   GLUCOSE 91 04/07/2015   BUN 32* 03/06/2016   BUN 66* 03/05/2016   BUN 9 04/07/2015   CREATININE 0.81 03/06/2016   CREATININE 1.19 03/05/2016   CREATININE 0.55* 04/07/2015   CALCIUM 8.2* 03/06/2016   CALCIUM 8.5* 03/05/2016   CALCIUM 8.7* 04/07/2015   LFT  Recent Labs  03/06/16 0529  PROT 5.4*  ALBUMIN 2.7*  AST 780*  ALT 544*  ALKPHOS 65  BILITOT 1.9*   PT/INR Lab Results  Component Value Date   INR 1.22 03/06/2016   INR 0.99 06/11/2012   Lipase     Component Value Date/Time   LIPASE 73* 02/16/2014 1732      RADIOLOGY STUDIES: Ct Angio Head W/cm &/or Wo Cm Ct Angio Neck W/cm &/or Wo/cm  03/06/2016  CLINICAL DATA:  Stroke EXAM: CT ANGIOGRAPHY HEAD AND NECK TECHNIQUE: Multidetector CT imaging of the head and neck was performed using the standard protocol during bolus  administration of intravenous contrast. Multiplanar CT image reconstructions and MIPs were obtained to evaluate the vascular anatomy. Carotid stenosis measurements (when applicable) are obtained utilizing NASCET criteria, using the distal internal carotid diameter as the denominator. CONTRAST:  50 mL Isovue 370 IV COMPARISON:  CT head 03/05/2016.  MRI 11/04/2013 and 06/11/2012 FINDINGS: Suboptimal arterial opacification due to timing of the injection. CTA NECK Aortic arch: Minimal atherosclerotic disease in the aortic arch. Proximal great vessels patent without significant stenosis. Lung apices clear.  No mass or adenopathy in the upper mediastinum. Right carotid system: Right common carotid artery widely patent. Atherosclerotic calcification at  the bifurcation. 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 This appears chronic and was present in 2013. Left carotid system: Left common carotid artery widely patent. Atherosclerotic disease in the distal common carotid artery. Metal stent across the carotid bifurcation. The stent is patent. Vertebral arteries:Proximal right vertebral artery is occluded with reconstitution in the mid neck. Moderate stenosis distal right vertebral artery. Left vertebral artery is patent to the basilar with scattered atherosclerotic disease. Skeleton: Cervical spondylosis.  No fracture or bone lesion. Other neck: No mass or adenopathy in the neck. Mucosal edema paranasal sinuses. CTA HEAD Anterior circulation: Right cavernous carotid is occluded which is chronic. There is reconstitution distally above the clinoid. Atherosclerotic calcification in the left cavernous carotid with mild stenosis. Anterior and middle cerebral arteries are poorly evaluated due to suboptimal opacification. These vessels appear patent Posterior circulation: Both vertebral arteries contribute to the basilar. PICA patent. Basilar patent. Posterior cerebral arteries patent bilaterally but not well  evaluated due to poor opacification Venous sinuses: Patent Anatomic variants: None Delayed phase: Normal enhancement. Hypodensity in the high right parietal lobe as noted previously. This is slightly more prominent compared with yesterday and may represent subacute infarction. Negative for hemorrhage. IMPRESSION: Unfortunately, there is suboptimal arterial opacification due to timing of the injection. 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 Occlusion proximal right vertebral artery which reconstitutes in the mid neck. High right frontal cortical hypodensity is more prominent today and may represent subacute infarct Electronically Signed   By: Franchot Gallo M.D.   On: 03/06/2016 12:24   Ct Head Wo Contrast  03/05/2016  CLINICAL DATA:  Left hemi neglect EXAM: CT HEAD WITHOUT CONTRAST TECHNIQUE: Contiguous axial images were obtained from the base of the skull through the vertex without intravenous contrast. COMPARISON:  MRI 11/04/2013 FINDINGS: Moderate atrophy. Diffuse ventricular enlargement unchanged from the prior MRI. This may be due to communicating hydrocephalus versus atrophy. Ill-defined hypodensity in the high right parietal lobe over the convexity. This is most likely volume averaging of a prominent sulcus which appears similar to the prior MRI. Acute infarct considered less likely given the appearance. Otherwise no acute infarct. Negative for hemorrhage or mass lesion. IMPRESSION: Generalized atrophy with moderate ventricular enlargement, stable from the prior MRI 11/04/2013. Ventricular enlargement raises the possibility of communicating hydrocephalus versus atrophy Hypodensity high right frontal lobe most likely volume averaging of a prominent sulcus. Electronically Signed   By: Franchot Gallo M.D.   On: 03/05/2016 16:31      ENDOSCOPIC STUDIES: Colonoscopy 11/2007.  For hematochezia, diarrhea and constipation.  Dr Ardis Hughs Small HP appearing sigmoid  polyp.  Path: hyperplastic polyp.   IMPRESSION:   *  GI bleeding.  Dark, FOBT + stool per rectal exam.  Incontinent at home. Rule out ulcers, rule out portal gastropathy, variceal bleeding. BUN of 32, c/w upper GIB. Omeprazole on home med list.   *  Blood loss anemia.  S/p PRBC x 3.    *  AMS, improved.  CT angio with ? Subacute right cortical infarct.  EEG and MRI not yet completed.  Mental status currently much improved.     *  Elevated transaminases, ? Shock liver and/or ETOH hepatitis. .  *  Fatigue, doe.  Cardiac stress test on 6/9.  Apparently unremarkable per pt and his sister, but do not have access to [facility] reports to confirm this.  Wonder if sxs were/are due to his anemia. Marland Kitchen    *  CAD.  DES in 02/2015, on chronic Plavix and ASA.  Due to anemia and FOBT +, these are both on hold.    *  Hx recurrent acute and chronic ETOH pancreatitis, on Pancrease at home.    PLAN:     *  Will need EGD but timing depends on outcomes of EEG and MRI head.  Ok to have clears if passes BS swallow eval.   Switched to BID IV Protonix. Stopped the PPI drip.  Pharmacy may not have IV Protonix available at this point however.  ?  Need to add empiric octreotide?   *  Will order abd ultrasound for AM tomorrow for reassessment on liver.  Based on exam, do not suspect ascites.    Azucena Freed  03/06/2016, 1:21 PM Pager: [phone]  Addendum 4:14 PM MRI head:  IMPRESSION: Acute infarctions scattered in the right middle cerebral artery territory as outlined above. Most extensive involvement is at the posterior frontal and frontoparietal junction cortical and subcortical brain. No mass effect or shift.

## 2016-03-06 NOTE — Procedures (Signed)
ELECTROENCEPHALOGRAM REPORT  Date of Study: 03/06/2016  Patient's Name: Brandon [name] MRN: [id number] Date of Birth: [birth year]  Referring Provider: Delia HeadyPramod Sethi, MD  Indication: 50 y.o. male who presents with acute onset of left hemiplegia,  found him at home immobile on the couch covered in urine and stool.  Medications: Morphine Zofran  Technical Summary: This is a multichannel digital EEG recording, using the international 10-20 placement system with electrodes applied with paste and impedances below 5000 ohms.    Description: The EEG is mostly recorded in the sleep and drowsy states.  The background is symmetric, with a well-developed posterior dominant rhythm of 9 Hz, which is reactive to eye opening and closing.  Diffuse beta activity is seen, with a bilateral frontal preponderance.  No focal or generalized abnormalities are seen.  No focal or generalized epileptiform discharges are seen.  Stage II sleep is seen, with normal and symmetric sleep patterns.  Hyperventilation and photic stimulation were not performed.  ECG demonstrates poor recording with artifact.  Impression: This is a normal routine EEG of the drowsy and asleep states.  A normal study does not rule out the possibility of a seizure disorder in this patient.  Adam R. Everlena CooperJaffe, DO

## 2016-03-06 NOTE — Progress Notes (Signed)
EEG Completed; Results Pending  

## 2016-03-07 ENCOUNTER — Inpatient Hospital Stay (HOSPITAL_COMMUNITY): Payer: Medicaid Other

## 2016-03-07 DIAGNOSIS — D649 Anemia, unspecified: Secondary | ICD-10-CM | POA: Diagnosis present

## 2016-03-07 DIAGNOSIS — K922 Gastrointestinal hemorrhage, unspecified: Secondary | ICD-10-CM | POA: Diagnosis present

## 2016-03-07 DIAGNOSIS — R414 Neurologic neglect syndrome: Secondary | ICD-10-CM | POA: Diagnosis present

## 2016-03-07 LAB — BASIC METABOLIC PANEL
Anion gap: 9 (ref 5–15)
BUN: 17 mg/dL (ref 6–20)
CALCIUM: 8 mg/dL — AB (ref 8.9–10.3)
CO2: 22 mmol/L (ref 22–32)
Chloride: 107 mmol/L (ref 101–111)
Creatinine, Ser: 0.66 mg/dL (ref 0.61–1.24)
GFR calc Af Amer: >60 mL/min (ref >60)
GLUCOSE: 126 mg/dL — AB (ref 65–99)
Potassium: 3.2 mmol/L — ABNORMAL LOW (ref 3.5–5.1)
Sodium: 138 mmol/L (ref 135–145)

## 2016-03-07 LAB — HEPATIC FUNCTION PANEL
ALBUMIN: 2.5 g/dL — AB (ref 3.5–5.0)
ALK PHOS: 70 U/L (ref 38–126)
ALT: 780 U/L — AB (ref 17–63)
AST: 804 U/L — ABNORMAL HIGH (ref 15–41)
BILIRUBIN TOTAL: 1.4 mg/dL — AB (ref 0.3–1.2)
Bilirubin, Direct: 0.4 mg/dL (ref 0.1–0.5)
Indirect Bilirubin: 1 mg/dL — ABNORMAL HIGH (ref 0.3–0.9)
Total Protein: 5.2 g/dL — ABNORMAL LOW (ref 6.5–8.1)

## 2016-03-07 LAB — CBC WITH DIFFERENTIAL/PLATELET
Basophils Absolute: 0 10*3/uL (ref 0.0–0.1)
Basophils Relative: 0 %
EOS PCT: 0 %
Eosinophils Absolute: 0 10*3/uL (ref 0.0–0.7)
HEMATOCRIT: 20.8 % — AB (ref 39.0–52.0)
Hemoglobin: 6.6 g/dL — CL (ref 13.0–17.0)
LYMPHS ABS: 1.2 10*3/uL (ref 0.7–4.0)
LYMPHS PCT: 10 %
MCH: 29.1 pg (ref 26.0–34.0)
MCHC: 31.7 g/dL (ref 30.0–36.0)
MCV: 91.6 fL (ref 78.0–100.0)
MONO ABS: 0.8 10*3/uL (ref 0.1–1.0)
MONOS PCT: 7 %
Neutro Abs: 9.4 10*3/uL — ABNORMAL HIGH (ref 1.7–7.7)
Neutrophils Relative %: 82 %
PLATELETS: 178 10*3/uL (ref 150–400)
RBC: 2.27 MIL/uL — ABNORMAL LOW (ref 4.22–5.81)
RDW: 17.4 % — AB (ref 11.5–15.5)
WBC: 11.5 10*3/uL — ABNORMAL HIGH (ref 4.0–10.5)

## 2016-03-07 LAB — ECHOCARDIOGRAM COMPLETE
Ao-asc: 36 cm
E/e' ratio: 7.74
EWDT: 230 ms
FS: 44 % (ref 28–44)
Height: 73 in
IVS/LV PW RATIO, ED: 1
LA ID, A-P, ES: 34 mm
LA diam end sys: 34 mm
LA vol index: 25 mL/m2
LADIAMINDEX: 1.6 cm/m2
LAVOL: 52.9 mL
LAVOLA4C: 44.9 mL
LDCA: 4.52 cm2
LV E/e'average: 7.74
LV TDI E'LATERAL: 12.1
LV e' LATERAL: 12.1 cm/s
LVEEMED: 7.74
LVOT diameter: 24 mm
MV Dec: 230
MV pk A vel: 113 m/s
MV pk E vel: 93.6 m/s
MVPG: 4 mmHg
PW: 8.39 mm — AB (ref 0.6–1.1)
RV TAPSE: 36.7 mm
Reg peak vel: 238 cm/s
TDI e' medial: 11.4
TRMAXVEL: 238 cm/s
Weight: 3079.39 oz

## 2016-03-07 LAB — MRSA PCR SCREENING: MRSA by PCR: NEGATIVE

## 2016-03-07 LAB — HEPATITIS PANEL, ACUTE
HCV Ab: <0.1 s/co ratio (ref 0.0–0.9)
HEP B C IGM: NEGATIVE
Hep A IgM: NEGATIVE
Hepatitis B Surface Ag: NEGATIVE

## 2016-03-07 LAB — HEMOGLOBIN AND HEMATOCRIT, BLOOD
HCT: 20.5 % — ABNORMAL LOW (ref 39.0–52.0)
HEMATOCRIT: 23.7 % — AB (ref 39.0–52.0)
HEMOGLOBIN: 6.6 g/dL — AB (ref 13.0–17.0)
HEMOGLOBIN: 7.8 g/dL — AB (ref 13.0–17.0)

## 2016-03-07 LAB — PROTIME-INR
INR: 1.29 (ref 0.00–1.49)
PROTHROMBIN TIME: 16.3 s — AB (ref 11.6–15.2)

## 2016-03-07 LAB — PREPARE RBC (CROSSMATCH)

## 2016-03-07 MED ORDER — LABETALOL HCL 5 MG/ML IV SOLN: 10.0000 mg | INTRAVENOUS | Status: DC | PRN

## 2016-03-07 MED ORDER — SODIUM CHLORIDE 0.9 % IV SOLN
Freq: Once | INTRAVENOUS | Status: AC
  Administered 2016-03-07: 500 mL via INTRAVENOUS

## 2016-03-07 MED ORDER — STROKE: EARLY STAGES OF RECOVERY BOOK
Freq: Once | Status: AC
  Administered 2016-03-07: 12:00:00
  Filled 2016-03-07: qty 1

## 2016-03-07 NOTE — Progress Notes (Signed)
Echocardiogram 2D Echocardiogram has been performed.  Brandon [name] 03/07/2016, 12:56 PM

## 2016-03-07 NOTE — Progress Notes (Signed)
CRITICAL VALUE ALERT  Critical value received:  Hbg 6.6  Date of notification:  03/07/2016  Time of notification:  0326  Critical value read back:Yes  Nurse who received alert:  Levie Heritageokiesha Amazin Pincock  MD notified (1st page):  Schoor  Time of first page:  0327  MD notified (2nd page):  Time of second page:  Responding MD:  Schoor Time MD responded:  0400

## 2016-03-07 NOTE — Progress Notes (Signed)
Daily Rounding Note  03/07/2016, 10:38 AM  LOS: 2 days   SUBJECTIVE:       Last BM was yesterday it was dark.  No n/v.  Still npo as no speech eval ordered  OBJECTIVE:         Vital signs in last 24 hours:    Temp:  [97.7 F (36.5 C)-99.3 F (37.4 C)] 98.6 F (37 C) (06/15 0736) Pulse Rate:  [80-101] 80 (06/15 0700) Resp:  [10-24] 18 (06/15 0736) BP: (164-184)/(78-100) 172/100 mmHg (06/15 0736) SpO2:  [97 %-100 %] 100 % (06/15 0736) Last BM Date: 03/05/16 Filed Weights   03/05/16 2333  Weight: 87.3 kg (192 lb 7.4 oz)   General: looks chronically unwelll   Heart: RRR Chest: clear bil.  No cough Abdomen: soft,  NT.  Active BS  Extremities: no CCE Neuro/Psych:  Weakness in left leg.   ? Asterixis in right arm vs tremor.   Intake/Output from previous day: 06/14 0701 - 06/15 0700 In: 2038.3 [I.V.:1867.5; Blood:170.8] Out: 750 [Urine:750]  Intake/Output this shift: Total I/O In: -  Out: 625 [Urine:625]  Lab Results:  Recent Labs  03/05/16 1555 03/06/16 0529 03/06/16 0916 03/06/16 1628 03/07/16 0213  WBC 13.2* 14.3*  --   --  11.5*  HGB 4.8* 7.4* 7.1* 7.2* 6.6*  6.6*  HCT 14.8* 22.2* 21.5* 22.0* 20.5*  20.8*  PLT 192 158  --   --  178   BMET  Recent Labs  03/05/16 1555 03/06/16 0529 03/07/16 0213  NA 127* 134* 138  K 3.5 2.9* 3.2*  CL 92* 100* 107  CO2 23 24 22   GLUCOSE 223* 165* 126*  BUN 66* 32* 17  CREATININE 1.19 0.81 0.66  CALCIUM 8.5* 8.2* 8.0*   LFT  Recent Labs  03/06/16 0529 03/06/16 1857  PROT 5.4* 5.3*  ALBUMIN 2.7* 2.6*  AST 780* 1188*  ALT 544* 816*  ALKPHOS 65 68  BILITOT 1.9* 1.4*  BILIDIR  --  0.5  IBILI  --  0.9   PT/INR  Recent Labs  03/06/16 0529  LABPROT 15.6*  INR 1.22   Hepatitis Panel  Recent Labs  03/06/16 1857  HEPBSAG Negative  HCVAB <0.1  HEPAIGM Negative  HEPBIGM Negative    Studies/Results: Ct Angio Head W/cm &/or Wo  Cm  03/06/2016  CLINICAL DATA:  Stroke EXAM: CT ANGIOGRAPHY HEAD AND NECK TECHNIQUE: Multidetector CT imaging of the head and neck was performed using the standard protocol during bolus administration of intravenous contrast. Multiplanar CT image reconstructions and MIPs were obtained to evaluate the vascular anatomy. Carotid stenosis measurements (when applicable) are obtained utilizing NASCET criteria, using the distal internal carotid diameter as the denominator. CONTRAST:  50 mL Isovue 370 IV COMPARISON:  CT head 03/05/2016.  MRI 11/04/2013 and 06/11/2012 FINDINGS: Suboptimal arterial opacification due to timing of the injection. CTA NECK Aortic arch: Minimal atherosclerotic disease in the aortic arch. Proximal great vessels patent without significant stenosis. Lung apices clear.  No mass or adenopathy in the upper mediastinum. Right carotid system: Right common carotid artery widely patent. Atherosclerotic calcification at the bifurcation. 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 This appears chronic and was present in 2013. Left carotid system: Left common carotid artery widely patent. Atherosclerotic disease in the distal common carotid artery. Metal stent across the carotid bifurcation. The stent is patent. Vertebral arteries:Proximal right vertebral artery is occluded with reconstitution in the mid neck. Moderate  stenosis distal right vertebral artery. Left vertebral artery is patent to the basilar with scattered atherosclerotic disease. Skeleton: Cervical spondylosis.  No fracture or bone lesion. Other neck: No mass or adenopathy in the neck. Mucosal edema paranasal sinuses. CTA HEAD Anterior circulation: Right cavernous carotid is occluded which is chronic. There is reconstitution distally above the clinoid. Atherosclerotic calcification in the left cavernous carotid with mild stenosis. Anterior and middle cerebral arteries are poorly evaluated due to suboptimal opacification. These  vessels appear patent Posterior circulation: Both vertebral arteries contribute to the basilar. PICA patent. Basilar patent. Posterior cerebral arteries patent bilaterally but not well evaluated due to poor opacification Venous sinuses: Patent Anatomic variants: None Delayed phase: Normal enhancement. Hypodensity in the high right parietal lobe as noted previously. This is slightly more prominent compared with yesterday and may represent subacute infarction. Negative for hemorrhage. IMPRESSION: Unfortunately, there is suboptimal arterial opacification due to timing of the injection. 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 Occlusion proximal right vertebral artery which reconstitutes in the mid neck. High right frontal cortical hypodensity is more prominent today and may represent subacute infarct Electronically Signed   By: Marlan Palau M.D.   On: 03/06/2016 12:24   Ct Head Wo Contrast  03/05/2016  CLINICAL DATA:  Left hemi neglect EXAM: CT HEAD WITHOUT CONTRAST TECHNIQUE: Contiguous axial images were obtained from the base of the skull through the vertex without intravenous contrast. COMPARISON:  MRI 11/04/2013 FINDINGS: Moderate atrophy. Diffuse ventricular enlargement unchanged from the prior MRI. This may be due to communicating hydrocephalus versus atrophy. Ill-defined hypodensity in the high right parietal lobe over the convexity. This is most likely volume averaging of a prominent sulcus which appears similar to the prior MRI. Acute infarct considered less likely given the appearance. Otherwise no acute infarct. Negative for hemorrhage or mass lesion. IMPRESSION: Generalized atrophy with moderate ventricular enlargement, stable from the prior MRI 11/04/2013. Ventricular enlargement raises the possibility of communicating hydrocephalus versus atrophy Hypodensity high right frontal lobe most likely volume averaging of a prominent sulcus. Electronically Signed    By: Marlan Palau M.D.   On: 03/05/2016 16:31   Ct Angio Neck W/cm &/or Wo/cm  03/06/2016  CLINICAL DATA:  Stroke EXAM: CT ANGIOGRAPHY HEAD AND NECK TECHNIQUE: Multidetector CT imaging of the head and neck was performed using the standard protocol during bolus administration of intravenous contrast. Multiplanar CT image reconstructions and MIPs were obtained to evaluate the vascular anatomy. Carotid stenosis measurements (when applicable) are obtained utilizing NASCET criteria, using the distal internal carotid diameter as the denominator. CONTRAST:  50 mL Isovue 370 IV COMPARISON:  CT head 03/05/2016.  MRI 11/04/2013 and 06/11/2012 FINDINGS: Suboptimal arterial opacification due to timing of the injection. CTA NECK Aortic arch: Minimal atherosclerotic disease in the aortic arch. Proximal great vessels patent without significant stenosis. Lung apices clear.  No mass or adenopathy in the upper mediastinum. Right carotid system: Right common carotid artery widely patent. Atherosclerotic calcification at the bifurcation. 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 This appears chronic and was present in 2013. Left carotid system: Left common carotid artery widely patent. Atherosclerotic disease in the distal common carotid artery. Metal stent across the carotid bifurcation. The stent is patent. Vertebral arteries:Proximal right vertebral artery is occluded with reconstitution in the mid neck. Moderate stenosis distal right vertebral artery. Left vertebral artery is patent to the basilar with scattered atherosclerotic disease. Skeleton: Cervical spondylosis.  No fracture or bone lesion.  Other neck: No mass or adenopathy in the neck. Mucosal edema paranasal sinuses. CTA HEAD Anterior circulation: Right cavernous carotid is occluded which is chronic. There is reconstitution distally above the clinoid. Atherosclerotic calcification in the left cavernous carotid with mild stenosis. Anterior and middle  cerebral arteries are poorly evaluated due to suboptimal opacification. These vessels appear patent Posterior circulation: Both vertebral arteries contribute to the basilar. PICA patent. Basilar patent. Posterior cerebral arteries patent bilaterally but not well evaluated due to poor opacification Venous sinuses: Patent Anatomic variants: None Delayed phase: Normal enhancement. Hypodensity in the high right parietal lobe as noted previously. This is slightly more prominent compared with yesterday and may represent subacute infarction. Negative for hemorrhage. IMPRESSION: Unfortunately, there is suboptimal arterial opacification due to timing of the injection. 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 Occlusion proximal right vertebral artery which reconstitutes in the mid neck. High right frontal cortical hypodensity is more prominent today and may represent subacute infarct Electronically Signed   By: Marlan Palau M.D.   On: 03/06/2016 12:24   Mr Brain Wo Contrast  03/06/2016  CLINICAL DATA:  Acute onset of left hemiplegia. Last seen normal 2 days ago. EXAM: MRI HEAD WITHOUT CONTRAST TECHNIQUE: Multiplanar, multiecho pulse sequences of the brain and surrounding structures were obtained without intravenous contrast. COMPARISON:  CT 03/05/2016.  CTA 03/06/2016. FINDINGS: Diffusion imaging and FLAIR axial sequences performed. The study suffers from motion degradation. There are scattered areas of acute infarction in the right middle cerebral artery territory. There are small areas of acute infarction in the right basal ganglia and along the insular cortex. There is a 1-2 cm region of acute infarction in the deep right frontal white matter. There is cortical and subcortical infarction in the right posterior frontal and frontoparietal junction region. There is mild swelling but no mass effect or shift. No hemorrhage is detected on these limited sequences. Elsewhere, the  brain shows generalized atrophy. Chronic small-vessel changes of the deep white matter. Ventricular prominence secondary to generalized atrophy. IMPRESSION: Acute infarctions scattered in the right middle cerebral artery territory as outlined above. Most extensive involvement is at the posterior frontal and frontoparietal junction cortical and subcortical brain. No mass effect or shift. Electronically Signed   By: Paulina Fusi M.D.   On: 03/06/2016 15:59   Scheduled Meds: . antiseptic oral rinse  7 mL Mouth Rinse q12n4p  . chlorhexidine  15 mL Mouth Rinse BID  . pantoprazole (PROTONIX) IV  40 mg Intravenous Q12H  . sodium chloride flush  3 mL Intravenous Q12H   Continuous Infusions: . sodium chloride 75 mL/hr at 03/07/16 0750   PRN Meds:.morphine injection, ondansetron (ZOFRAN) IV   ASSESMENT:   * GI bleeding. Dark, FOBT + stool per rectal exam. Incontinent at home. Rule out ulcers, rule out portal gastropathy, variceal bleeding. BUN of 32, c/w upper GIB. On BID IV Protonix.    * Blood loss anemia. S/p PRBC x 4.   * AMS, CVA.  CT angio with ? Subacute right cortical infarct. MRI: right MCA territory infarcts. Mental status improved.EEG normal.   * Elevated transaminases, ? Shock liver and/or ETOH hepatitis.  Coags normal. Ammonia normal.   * Fatigue, doe. Cardiac stress test on 6/9. Apparently unremarkable per pt and his sister, but do not have access to [facility] reports to confirm this. Wonder if sxs were/are due to his anemia. .   * CAD. DES in 02/2015, on chronic Plavix and ASA. Due to  anemia and FOBT +, these are both on hold.   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     PLAN   *  EGD set up for tomorrow at ~ 0915.    *  LFTs in AM.  CBC q 8 hours.   *  Do not see SLP eval and pt is npo. I ordered SLP eval.       Jennye Moccasin  03/07/2016, 10:38 AM Pager: [phone]

## 2016-03-07 NOTE — Evaluation (Signed)
Speech Language Pathology Evaluation Patient Details Name: Brandon [name] MRN: [id number] DOB: 10/25/1965 Today's Date: 03/07/2016 Time: 1445-1500 SLP Time Calculation (min) (ACUTE ONLY): 15 min  Problem List:  Patient Active Problem List   Diagnosis Date Noted  . Left-sided neglect   . Severe anemia   . UGIB (upper gastrointestinal bleed)   . Pressure ulcer 03/06/2016  . Altered mental status   . CVA (cerebral infarction) 03/05/2016  . Carotid stenosis 04/06/2015  . CAD in native artery 03/16/2015  . Unstable angina pectoris (HCC) 03/15/2015  . Neck pain 10/20/2013  . Insomnia 12/29/2012  . 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 Occlusion and stenosis of carotid artery with cerebral infarction 12/11/2012  . Unspecified cerebral artery occlusion with cerebral infarction 12/11/2012  . Fatigue 11/26/2012  . Hallux valgus 06/25/2012  . Preventative health care 06/25/2012  . Alcohol Dependence 06/18/2012  . Smoking 06/16/2012  . Bilateral extracranial carotid artery stenosis   . Coronary Artery Disease 06/13/2012  . Ischemic Stroke 06/13/2012  . Hyperlipidemia   . Hypertension 02/25/2011  . GERD (gastroesophageal reflux disease) 02/25/2011  . Chronic Pancreatitis. 02/25/2011  . Hepatic steatosis 02/25/2011   Past Medical History:  Past Medical History  Diagnosis Date  . Alcohol dependency (HCC)     Hx ETOH withdrawal seizures before 2011  . Pancreatitis     CT findings in May 2011 with inflammation and pseudocyst  . GERD (gastroesophageal reflux disease)   . CAD (coronary artery disease) 06/13/2012    Calcification noted on CTA of chest in 2012 Wall motion abnormality on ECHO    . 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 Hyperlipidemia   . Headache(784.0)     migraine  . Heart disease 02/2015    PCI/DES placed to RCA: on chronic Plavix/ASA  . Depression with anxiety   . Hypertension   . Carotid artery disease (HCC)  2016    bilateral.  s/p left carotid stent 03/2015   Past Surgical History:  Past Surgical History  Procedure Laterality Date  . None    . Carotid angiogram N/A 06/15/2012    Procedure: CAROTID ANGIOGRAM;  Surgeon: Chuck Hint, MD;  Location: [facility] CATH LAB;  Service: Cardiovascular;  Laterality: N/A;  . Cardiac catheterization N/A 03/15/2015    Procedure: Left Heart Cath;  Surgeon: Laurier Nancy, MD;  Location: [facility] INVASIVE CV LAB;  Service: Cardiovascular;  Laterality: N/A;  . Cardiac catheterization N/A 03/16/2015    Procedure: Coronary Stent Intervention;  Surgeon: Alwyn Pea, MD;  Location: [facility] INVASIVE CV LAB;  Service: Cardiovascular;  Laterality: N/A;  . Peripheral vascular catheterization Left 04/06/2015    Procedure: Carotid PTA/Stent Intervention;  Surgeon: Annice Needy, MD;  Location: [facility] INVASIVE CV LAB;  Service: Cardiovascular;  Laterality: Left;   HPI:  50 y.o. male with history of ischemic heart disease and CVA presenting with L sided weakness, found to have right MCA infarctions and upper GI bleed.    Assessment / Plan / Recommendation Clinical Impression  Pt is disoriented to location, requiring Max cues with binary choice to determine he is in the hospital. He has a right-sided gaze preference, although with Min-Mod cues he does bring his attention to his left side. Reduced sustained attention makes storage of new information difficult. Pt's relative strength is his intellectual awareness and insight, stating several of his current impairments and verbalizing that this stroke has impacted him more than  his previous one. Pt will need SLP f/u to maximize cognitive recovery. Given pt's acute CVA and left-sided weakness, please consider PT/OT evaluations when medically ready.    SLP Assessment  Patient needs continued Speech Lanaguage Pathology Services    Follow Up Recommendations  Inpatient Rehab;24 hour supervision/assistance    Frequency and Duration min  2x/week  2 weeks      SLP Evaluation Prior Functioning  Cognitive/Linguistic Baseline: Information not available  Lives With: Significant other;Other (Comment) (sister)   Cognition  Overall Cognitive Status: Impaired/Different from baseline Arousal/Alertness: Awake/alert Orientation Level: Oriented to person;Oriented to time;Oriented to situation;Disoriented to place Attention: Sustained Sustained Attention: Impaired Sustained Attention Impairment: Verbal basic Memory: Impaired Memory Impairment: Storage deficit Awareness:  (intellectual awareness appears adequate) Behaviors: Impulsive    Comprehension  Auditory Comprehension Overall Auditory Comprehension: Appears within functional limits for tasks assessed    Expression Expression Primary Mode of Expression: Verbal Verbal Expression Overall Verbal Expression: Appears within functional limits for tasks assessed   Oral / Motor  Oral Motor/Sensory Function Overall Oral Motor/Sensory Function: Mild impairment Facial ROM: Reduced left;Suspected CN VII (facial) dysfunction Facial Symmetry: Abnormal symmetry left;Suspected CN VII (facial) dysfunction Facial Strength: Reduced left;Suspected CN VII (facial) dysfunction Motor Speech Overall Motor Speech: Appears within functional limits for tasks assessed   GO                   Brandon [name], M.A. CCC-SLP [phone]  Brandon [name], Brandon [name] 03/07/2016, 4:47 PM

## 2016-03-07 NOTE — Evaluation (Signed)
Clinical/Bedside Swallow Evaluation Patient Details  Name: Brandon [name] MRN: [id number] Date of Birth: [birth year]  Today's Date: 03/07/2016 Time: SLP Start Time (ACUTE ONLY): 1445 SLP Stop Time (ACUTE ONLY): 1500 SLP Time Calculation (min) (ACUTE ONLY): 15 min  Past Medical History:  Past Medical History  Diagnosis Date  . Alcohol dependency (HCC)     Hx ETOH withdrawal seizures before 2011  . Pancreatitis     CT findings in May 2011 with inflammation and pseudocyst  . GERD (gastroesophageal reflux disease)   . CAD (coronary artery disease) 06/13/2012    Calcification noted on CTA of chest in 2012 Wall motion abnormality on ECHO    . 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 Hyperlipidemia   . Headache(784.0)     migraine  . Heart disease 02/2015    PCI/DES placed to RCA: on chronic Plavix/ASA  . Depression with anxiety   . Hypertension   . Carotid artery disease (HCC) 2016    bilateral.  s/p left carotid stent 03/2015   Past Surgical History:  Past Surgical History  Procedure Laterality Date  . None    . Carotid angiogram N/A 06/15/2012    Procedure: CAROTID ANGIOGRAM;  Surgeon: Chuck Hint, MD;  Location: [facility] CATH LAB;  Service: Cardiovascular;  Laterality: N/A;  . Cardiac catheterization N/A 03/15/2015    Procedure: Left Heart Cath;  Surgeon: Laurier Nancy, MD;  Location: [facility] INVASIVE CV LAB;  Service: Cardiovascular;  Laterality: N/A;  . Cardiac catheterization N/A 03/16/2015    Procedure: Coronary Stent Intervention;  Surgeon: Alwyn Pea, MD;  Location: [facility] INVASIVE CV LAB;  Service: Cardiovascular;  Laterality: N/A;  . Peripheral vascular catheterization Left 04/06/2015    Procedure: Carotid PTA/Stent Intervention;  Surgeon: Annice Needy, MD;  Location: [facility] INVASIVE CV LAB;  Service: Cardiovascular;  Laterality: Left;   HPI:  50 y.o. male with history of ischemic heart disease and CVA presenting with L sided  weakness, found to have right MCA infarctions and upper GI bleed.    Assessment / Plan / Recommendation Clinical Impression  Pt's oropharyngeal swallow appears WFL even during periods of impulsivity, although assessment was limited to clear liquids given current GI issues. He seems appropriate for clear liquid diet as oulined in GI note. Will f/u briefly for tolerance and to determine most appropriate diet once he is medically able to try a more solid diet.    Aspiration Risk  Mild aspiration risk    Diet Recommendation Thin liquid (CLD per MD)   Liquid Administration via: Cup;Straw Medication Administration: Whole meds with liquid Supervision: Patient able to self feed;Full supervision/cueing for compensatory strategies Compensations: Minimize environmental distractions;Slow rate;Small sips/bites Postural Changes: Seated upright at 90 degrees    Other  Recommendations Oral Care Recommendations: Oral care BID   Follow up Recommendations   (will need f/u for cognition)    Frequency and Duration min 2x/week  2 weeks       Prognosis Prognosis for Safe Diet Advancement: Good Barriers to Reach Goals: Cognitive deficits      Swallow Study   General Date of Onset: 03/05/16 HPI: 50 y.o. male with history of ischemic heart disease and CVA presenting with L sided weakness, found to have right MCA infarctions and upper GI bleed.  Type of Study: Bedside Swallow Evaluation Previous Swallow Assessment: none in chart Diet Prior to this Study: NPO Temperature Spikes Noted: No Respiratory Status: Room air  History of Recent Intubation: No Behavior/Cognition: Alert;Cooperative;Confused;Requires cueing Oral Cavity Assessment: Within Functional Limits Oral Care Completed by SLP: No Vision: Functional for self-feeding Self-Feeding Abilities: Able to feed self Patient Positioning: Upright in bed Baseline Vocal Quality: Normal    Oral/Motor/Sensory Function Overall Oral Motor/Sensory  Function: Mild impairment Facial ROM: Reduced left;Suspected CN VII (facial) dysfunction Facial Symmetry: Abnormal symmetry left;Suspected CN VII (facial) dysfunction Facial Strength: Reduced left;Suspected CN VII (facial) dysfunction   Ice Chips Ice chips: Not tested   Thin Liquid Thin Liquid: Within functional limits Presentation: Cup;Self Fed;Straw    Nectar Thick Nectar Thick Liquid: Not tested   Honey Thick Honey Thick Liquid: Not tested   Puree Puree: Not tested   Solid   GO   Solid: Not tested       Maxcine HamLaura Paiewonsky, M.A. CCC-SLP [phone]  Maxcine Hamaiewonsky, Latoya Diskin 03/07/2016,4:32 PM

## 2016-03-07 NOTE — Progress Notes (Addendum)
STROKE TEAM PROGRESS NOTE   HISTORY OF PRESENT ILLNESS (per record) Brandon [name] is an 50 y.o. male who presents with acute onset of left hemiplegia. He was last seen normal by his wife at 3 PM yesterday 03/04/2016, and last heard to be normal by phone at midnight. Today 03/05/2016, his son found him at home immobile on the couch covered in urine and stool. The patient was transported by EMS to the ED and exam revealed dense left sided weakness. He was out of the IV tPA and endovascular/interventional time windows. He was unable to get up due to feeling "dizzy" and felt as though he would fall when he "tried to walk". He has mild left sided residual deficits from prior stroke. Per EMS, when they tried to sit him up he lost consciousness and was incontinent again of stool. Patient was not administered IV t-PA secondary to delay in arrival. He was admitted to the stepdown unit for further evaluation and treatment.   SUBJECTIVE (INTERVAL HISTORY) No family is at the bedside.  Overall he feels his condition is stable.  Patient left hemiplegia appears to be stable. Plans to transfuse blood today and GI may plan endoscopy to look for source for hemorrhage. EEG showed no seizure activity and MRI confirms a right MCA branch and watershed infarct. CT angiogram shows chronic right ICA occlusion and patent left ICA stent. OBJECTIVE Temp:  [97.7 F (36.5 C)-99.3 F (37.4 C)] 98.3 F (36.8 C) (06/15 1211) Pulse Rate:  [78-101] 83 (06/15 1211) Cardiac Rhythm:  [-] Normal sinus rhythm (06/15 1211) Resp:  [10-24] 18 (06/15 1211) BP: (139-184)/(73-100) 165/96 mmHg (06/15 1211) SpO2:  [97 %-100 %] 97 % (06/15 1211)  CBC:   Recent Labs Lab 03/05/16 1555 03/06/16 0529  03/07/16 0213 03/07/16 1030  WBC 13.2* 14.3*  --  11.5*  --   NEUTROABS 11.5*  --   --  9.4*  --   HGB 4.8* 7.4*  < > 6.6*  6.6* 7.8*  HCT 14.8* 22.2*  < > 20.5*  20.8* 23.7*  MCV 92.5 90.2  --  91.6  --   PLT 192 158  --  178  --   <  > = values in this interval not displayed.  Basic Metabolic Panel:   Recent Labs Lab 03/05/16 1555 03/06/16 0529 03/07/16 0213  NA 127* 134* 138  K 3.5 2.9* 3.2*  CL 92* 100* 107  CO2 23 24 22   GLUCOSE 223* 165* 126*  BUN 66* 32* 17  CREATININE 1.19 0.81 0.66  CALCIUM 8.5* 8.2* 8.0*  MG 2.3  --   --     Lipid Panel:     Component Value Date/Time   CHOL 74 03/06/2016 0916   TRIG 51 03/06/2016 0916   HDL 27* 03/06/2016 0916   CHOLHDL 2.7 03/06/2016 0916   VLDL 10 03/06/2016 0916   LDLCALC 37 03/06/2016 0916   HgbA1c:  Lab Results  Component Value Date   HGBA1C 5.8* 06/11/2012   Urine Drug Screen:     Component Value Date/Time   LABOPIA NONE DETECTED 06/11/2012 1126   COCAINSCRNUR NONE DETECTED 06/11/2012 1126   LABBENZ NONE DETECTED 06/11/2012 1126   AMPHETMU NONE DETECTED 06/11/2012 1126   THCU NONE DETECTED 06/11/2012 1126   LABBARB NONE DETECTED 06/11/2012 1126      IMAGING  Ct Angio Head W/cm &/or Wo Cm  03/06/2016  CLINICAL DATA:  Stroke EXAM: CT ANGIOGRAPHY HEAD AND NECK TECHNIQUE: Multidetector CT imaging of the  head and neck was performed using the standard protocol during bolus administration of intravenous contrast. Multiplanar CT image reconstructions and MIPs were obtained to evaluate the vascular anatomy. Carotid stenosis measurements (when applicable) are obtained utilizing NASCET criteria, using the distal internal carotid diameter as the denominator. CONTRAST:  50 mL Isovue 370 IV COMPARISON:  CT head 03/05/2016.  MRI 11/04/2013 and 06/11/2012 FINDINGS: Suboptimal arterial opacification due to timing of the injection. CTA NECK Aortic arch: Minimal atherosclerotic disease in the aortic arch. Proximal great vessels patent without significant stenosis. Lung apices clear.  No mass or adenopathy in the upper mediastinum. Right carotid system: Right common carotid artery widely patent. Atherosclerotic calcification at the bifurcation. Occlusion of the right  internal carotid artery at the origin. This appears chronic and was present in 2013. Left carotid system: Left common carotid artery widely patent. Atherosclerotic disease in the distal common carotid artery. Metal stent across the carotid bifurcation. The stent is patent. Vertebral arteries:Proximal right vertebral artery is occluded with reconstitution in the mid neck. Moderate stenosis distal right vertebral artery. Left vertebral artery is patent to the basilar with scattered atherosclerotic disease. Skeleton: Cervical spondylosis.  No fracture or bone lesion. Other neck: No mass or adenopathy in the neck. Mucosal edema paranasal sinuses. CTA HEAD Anterior circulation: Right cavernous carotid is occluded which is chronic. There is reconstitution distally above the clinoid. Atherosclerotic calcification in the left cavernous carotid with mild stenosis. Anterior and middle cerebral arteries are poorly evaluated due to suboptimal opacification. These vessels appear patent Posterior circulation: Both vertebral arteries contribute to the basilar. PICA patent. Basilar patent. Posterior cerebral arteries patent bilaterally but not well evaluated due to poor opacification Venous sinuses: Patent Anatomic variants: None Delayed phase: Normal enhancement. Hypodensity in the high right parietal lobe as noted previously. This is slightly more prominent compared with yesterday and may represent subacute infarction. Negative for hemorrhage. IMPRESSION: Unfortunately, there is suboptimal arterial opacification due to timing of the injection. 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 Occlusion proximal right vertebral artery which reconstitutes in the mid neck. High right frontal cortical hypodensity is more prominent today and may represent subacute infarct Electronically Signed   By: Marlan Palau M.D.   On: 03/06/2016 12:24   Ct Head Wo Contrast  03/05/2016  CLINICAL DATA:  Left  hemi neglect EXAM: CT HEAD WITHOUT CONTRAST TECHNIQUE: Contiguous axial images were obtained from the base of the skull through the vertex without intravenous contrast. COMPARISON:  MRI 11/04/2013 FINDINGS: Moderate atrophy. Diffuse ventricular enlargement unchanged from the prior MRI. This may be due to communicating hydrocephalus versus atrophy. Ill-defined hypodensity in the high right parietal lobe over the convexity. This is most likely volume averaging of a prominent sulcus which appears similar to the prior MRI. Acute infarct considered less likely given the appearance. Otherwise no acute infarct. Negative for hemorrhage or mass lesion. IMPRESSION: Generalized atrophy with moderate ventricular enlargement, stable from the prior MRI 11/04/2013. Ventricular enlargement raises the possibility of communicating hydrocephalus versus atrophy Hypodensity high right frontal lobe most likely volume averaging of a prominent sulcus. Electronically Signed   By: Marlan Palau M.D.   On: 03/05/2016 16:31   Ct Angio Neck W/cm &/or Wo/cm  03/06/2016  CLINICAL DATA:  Stroke EXAM: CT ANGIOGRAPHY HEAD AND NECK TECHNIQUE: Multidetector CT imaging of the head and neck was performed using the standard protocol during bolus administration of intravenous contrast. Multiplanar CT image reconstructions and MIPs were obtained to evaluate the  vascular anatomy. Carotid stenosis measurements (when applicable) are obtained utilizing NASCET criteria, using the distal internal carotid diameter as the denominator. CONTRAST:  50 mL Isovue 370 IV COMPARISON:  CT head 03/05/2016.  MRI 11/04/2013 and 06/11/2012 FINDINGS: Suboptimal arterial opacification due to timing of the injection. CTA NECK Aortic arch: Minimal atherosclerotic disease in the aortic arch. Proximal great vessels patent without significant stenosis. Lung apices clear.  No mass or adenopathy in the upper mediastinum. Right carotid system: Right common carotid artery widely  patent. Atherosclerotic calcification at the bifurcation. 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 This appears chronic and was present in 2013. Left carotid system: Left common carotid artery widely patent. Atherosclerotic disease in the distal common carotid artery. Metal stent across the carotid bifurcation. The stent is patent. Vertebral arteries:Proximal right vertebral artery is occluded with reconstitution in the mid neck. Moderate stenosis distal right vertebral artery. Left vertebral artery is patent to the basilar with scattered atherosclerotic disease. Skeleton: Cervical spondylosis.  No fracture or bone lesion. Other neck: No mass or adenopathy in the neck. Mucosal edema paranasal sinuses. CTA HEAD Anterior circulation: Right cavernous carotid is occluded which is chronic. There is reconstitution distally above the clinoid. Atherosclerotic calcification in the left cavernous carotid with mild stenosis. Anterior and middle cerebral arteries are poorly evaluated due to suboptimal opacification. These vessels appear patent Posterior circulation: Both vertebral arteries contribute to the basilar. PICA patent. Basilar patent. Posterior cerebral arteries patent bilaterally but not well evaluated due to poor opacification Venous sinuses: Patent Anatomic variants: None Delayed phase: Normal enhancement. Hypodensity in the high right parietal lobe as noted previously. This is slightly more prominent compared with yesterday and may represent subacute infarction. Negative for hemorrhage. IMPRESSION: Unfortunately, there is suboptimal arterial opacification due to timing of the injection. 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 Occlusion proximal right vertebral artery which reconstitutes in the mid neck. High right frontal cortical hypodensity is more prominent today and may represent subacute infarct Electronically Signed   By: Marlan Palau M.D.    On: 03/06/2016 12:24   Mr Brain Wo Contrast  03/06/2016  CLINICAL DATA:  Acute onset of left hemiplegia. Last seen normal 2 days ago. EXAM: MRI HEAD WITHOUT CONTRAST TECHNIQUE: Multiplanar, multiecho pulse sequences of the brain and surrounding structures were obtained without intravenous contrast. COMPARISON:  CT 03/05/2016.  CTA 03/06/2016. FINDINGS: Diffusion imaging and FLAIR axial sequences performed. The study suffers from motion degradation. There are scattered areas of acute infarction in the right middle cerebral artery territory. There are small areas of acute infarction in the right basal ganglia and along the insular cortex. There is a 1-2 cm region of acute infarction in the deep right frontal white matter. There is cortical and subcortical infarction in the right posterior frontal and frontoparietal junction region. There is mild swelling but no mass effect or shift. No hemorrhage is detected on these limited sequences. Elsewhere, the brain shows generalized atrophy. Chronic small-vessel changes of the deep white matter. Ventricular prominence secondary to generalized atrophy. IMPRESSION: Acute infarctions scattered in the right middle cerebral artery territory as outlined above. Most extensive involvement is at the posterior frontal and frontoparietal junction cortical and subcortical brain. No mass effect or shift. Electronically Signed   By: Paulina Fusi M.D.   On: 03/06/2016 15:59   US Abdomen Complete  03/07/2016  CLINICAL DATA:  Alcoholic with fatty liver, pancreatitis, ascites by ultrasound and CT in 2011. Assess  for progression to cirrhosis and recurrent ascites. EXAM: ABDOMEN ULTRASOUND COMPLETE COMPARISON:  02/16/2014 FINDINGS: Gallbladder: Gallbladder has a normal appearance. Gallbladder wall is 1.5 mm, within normal limits. No stones or pericholecystic fluid. No sonographic Murphy's sign. Common bile duct: Diameter: 5.8 mm Liver: The liver is echogenic. There is attenuation of the  ultrasound wave, poor visualization of the internal hepatic architecture, and loss of definition of the diaphragm. No focal liver lesions are identified. IVC: No abnormality visualized. Pancreas: Not well seen because of bowel gas. Spleen: Not well seen because of bowel gas. Right Kidney: Length: 13.6 cm. Echogenicity within normal limits. No mass or hydronephrosis visualized. Left Kidney: Length: 13.3 cm . Echogenicity within normal limits. No mass or hydronephrosis visualized. Abdominal aorta: Proximal aspect not well seen. Visualized portion is not aneurysmal, 2.4 cm. Other findings: No ascites identified. Study quality is degraded by overlying bowel gas, and level of patient cooperation. IMPRESSION: 1. Hepatic steatosis.  No focal liver lesion. 2. No ascites. Electronically Signed   By: Norva Pavlov M.D.   On: 03/07/2016 11:08       PHYSICAL EXAM Middle aged Caucasian male not in distress. . Afebrile. Head is nontraumatic. Neck is supple without bruit.    Cardiac exam no murmur or gallop. Lungs are clear to auscultation. Distal pulses are well felt. Neurological Exam :  Awake alert oriented to place and person. Diminished attention, registration and recall. Speech is clear without dysarthria. No aphasia. Right gaze preference but able to look to the left past midline. No blink to threat on the right but not on the left suspect left common and was hemianopsia. Mild left lower facial weakness. Tongue midline. Motor system exam reveals left hemiparesis with grade 2/5 in the left hand and 3/5 in the left lower extremity. Normal strength on the right. Decreased left hemibody touch and pinprick sensation. Reflexes brisk on the left compared to the right. Left plantar upgoing right downgoing. ASSESSMENT/PLAN Brandon [name] is a 50 y.o. male with history of ischemic heart disease and CVA presenting with L sided weakness. He did not receive IV t-PA due to delay in arrival.    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 There are small areas of acute infarction in the right basal ganglia and along the insular cortex. There is a 1-2 cm region of acute infarction in the deep right frontal white  matter  CTA head & neck   chronic right ICA occlusion at its origin. Left ICA stent is patent  2D Echo  ordered  EEG normal   LDL 37 mg%  HgbA1c 5.8  SCDs for VTE prophylaxis Diet clear liquid Room service appropriate?: Yes; Fluid consistency:: Thin Diet NPO time specified  aspirin 81 mg daily and clopidogrel 75 mg daily prior to admission though patient admits he was not taking as routinely as he should, now on No antithrombotic due to anemia.   Ongoing aggressive stroke risk factor management  Therapy recommendations:  pending   Disposition:  pending   Hypertension  Stable  Permissive hypertension (OK if < 220/120) but gradually normalize in 5-7 days  Long-term BP goal normotensive  Hyperlipidemia  Home meds:  zocor 20  LDL added, goal < 70  zocor on hold due to elevated CK  Resume statin when able  Continue statin  at discharge  Other Stroke Risk Factors  Cigarette smoker, advised to stop smoking  ETOH abuse, advised to drink no more than 2 drinks a day  Hx stroke/TIA  05/2012 R parietal and insular infarct, embolic d/t R ICA occlusion, incidental L ICA dissection  L ICA stent by Dr. Festus Barren in [facility] 04/06/15  Family hx stroke (mother)  Coronary artery disease  Other Active Problems  Anemia. HGB 4.8, transfused w/ 3 units, possible GIB  AKI  Hypokalemia 2.9, replaced  Hospital day # 2  SETHI,PRAMOD  [facility] See Amion for Pager information 03/07/2016 3:21 PM  I have personally examined this patient, reviewed notes, independently viewed imaging studies,  participated in medical decision making and plan of care. I have made any additions or clarifications directly to the above note. Agree with note above.  He presented with an Onset Left Hemiparesis Due To Right hemispheric Infarct Due To Failure of Collaterals Circulation in a Patient with Known Chronic Rt ICA Occlusion.   He Also Had What Appears to Be an Acute GI Bleed with Low Hematocrit Requiring Transfusion.  Plan patient is neurologically stable. He needs emergent endoscopy to identify source of hemorrhage and possible treatment. I have discussed the case with gastroenterologist and recommend minimal sedation and to avoid hypotension. Greater than 50% time during this 25 minute visit was spent on counseling and coordination of care about stroke risk and prevention  Delia Heady, MD Medical Director [facility] Pager: [phone] 03/07/2016 3:21 PM    To contact Stroke Continuity provider, please refer to [email or web address]. After hours, contact General Neurology

## 2016-03-07 NOTE — Progress Notes (Signed)
PROGRESS NOTE    Brandon [name]  ZOX:[id number] DOB: 02/13/1966 DOA: 03/05/2016 PCP: Laurier Nancy, MD (Confirm with patient/family/NH records and if not entered, this HAS to be entered at [facility] point of entry. "No PCP" if truly none.)   Brief Narrative: (Start on day 1 of progress note - keep it brief and live) Acute blood loss anemia due to upper GI bleed complicated with acute CVA, 50 yo male with carotid stenosis and ischemic heart disease. Persistent left hemiparesis, no further bleeding, had 3 units prbc transfused.   Assessment & Plan:   Active Problems:   CVA (cerebral infarction)   Pressure ulcer   Altered mental status   Left-sided neglect   1. Cardiovascular. Noted elevated blood pressure, will allow permissive hypertension in the setting of acute cva, will use labetalol  if blood pressure greater than 210/120, will continue to monitor hb and hct. Avoid hypotension.  Patient had #4 prbc transfused this am for persistent anemia.   2,. Pulmonary. Will continue to monitor oxymetry, aspiration precautions, supplemental 02 per [place] to target 02 sat above 92%, no signs of volume overload. Head elevated 45 degrees at all times.  3. Nephrology, Renal function with cr at 0.66, will continue to follow on renal function and electrolytes, avoid hypotension or nephrotoxic medications, will keep hb above 7.    4, Neruology. Acute infarctions scattered in the right middle cerebral artery territory, per MRI. Positive carotid artery occlusion on the right, patent stent on the left, and right proximal vertebral occlusion.  Follow speech evaluation.   5. GI. Will continue ppi iv bid, will follow on endoscopic results in am, continue to monitor hb and hct, no signs of active bleeding, no melena, hematemesis or hematochezia.  6. dvt px  Patient continue to be at high risk for worsening anemia and cva.   DVT prophylaxis: mechanical compression devices. Code Status: (Full/Partial - specify  details) Family Communication: (Specify name, relationship & date discussed. NO "discussed with patient") Disposition Plan: (specify when and where you expect patient to be discharged). Include barriers to DC in this tab.   Consultants:   Neurology  gastroenetrology  Procedures: (Don't include imaging studies which can be auto populated. Include things that cannot be auto populated i.e. Echo, Carotid and venous dopplers, Foley, Bipap, HD, tubes/drains, wound vac, central lines etc)    Antimicrobials: (specify start and planned stop date. Auto populated tables are space occupying and do not give end dates)      Subjective: Patient more reactive and interactive, still confused and disorientated. Follows commands. Requesting po food. No nausea or vomiting, no chest pain or sob. Per nursing no melena, hematemesis or hematochezia.    Objective: Filed Vitals:   03/07/16 0736 03/07/16 1100 03/07/16 1211 03/07/16 1540  BP: 172/100 139/73 165/96 181/82  Pulse:  78 83 79  Temp: 98.6 F (37 C)  98.3 F (36.8 C) 98.3 F (36.8 C)  TempSrc: Oral  Oral Axillary  Resp: 18 17 18 17   Height:      Weight:      SpO2: 100% 99% 97% 97%    Intake/Output Summary (Last 24 hours) at 03/07/16 1601 Last data filed at 03/07/16 1500  Gross per 24 hour  Intake 2638.33 ml  Output    825 ml  Net 1813.33 ml   Filed Weights   03/05/16 2333  Weight: 87.3 kg (192 lb 7.4 oz)    Examination:  General exam: deconditioned E ENT: oral mucosa moist, positive conjunctival  pallor, no icterus. Respiratory system: Respiratory effort normal. No wheezing, rales or rhonchi. Decrease breath sounds at bases with poor inspiratory effort. Cardiovascular system: S1 & S2 heard, RRR. No JVD, murmurs, rubs, gallops or clicks. No pedal edema. Gastrointestinal system: Abdomen is nondistended, soft and nontender. No organomegaly or masses felt. Normal bowel sounds heard. Central nervous system: Alert and awake.  Persistent paresis on the left upper ext, able to move without gravity. Left lower ext able to lift against gravity but no resistance.  Extremities: Symmetric 5 x 5 power. Skin: No rashes, lesions or ulcers .     Data Reviewed: I have personally reviewed following labs and imaging studies  CBC:  Recent Labs Lab 03/05/16 1555 03/06/16 0529 03/06/16 0916 03/06/16 1628 03/07/16 0213 03/07/16 1030  WBC 13.2* 14.3*  --   --  11.5*  --   NEUTROABS 11.5*  --   --   --  9.4*  --   HGB 4.8* 7.4* 7.1* 7.2* 6.6*  6.6* 7.8*  HCT 14.8* 22.2* 21.5* 22.0* 20.5*  20.8* 23.7*  MCV 92.5 90.2  --   --  91.6  --   PLT 192 158  --   --  178  --    Basic Metabolic Panel:  Recent Labs Lab 03/05/16 1555 03/06/16 0529 03/07/16 0213  NA 127* 134* 138  K 3.5 2.9* 3.2*  CL 92* 100* 107  CO2 23 24 22   GLUCOSE 223* 165* 126*  BUN 66* 32* 17  CREATININE 1.19 0.81 0.66  CALCIUM 8.5* 8.2* 8.0*  MG 2.3  --   --    GFR: Estimated Creatinine Clearance: 123.5 mL/min (by C-G formula based on Cr of 0.66). Liver Function Tests:  Recent Labs Lab 03/06/16 0529 03/06/16 1857 03/07/16 1433  AST 780* 1188* 804*  ALT 544* 816* 780*  ALKPHOS 65 68 70  BILITOT 1.9* 1.4* 1.4*  PROT 5.4* 5.3* 5.2*  ALBUMIN 2.7* 2.6* 2.5*   No results for input(s): LIPASE, AMYLASE in the last 168 hours.  Recent Labs Lab 03/06/16 1620  AMMONIA 13   Coagulation Profile:  Recent Labs Lab 03/06/16 0529 03/07/16 1433  INR 1.22 1.29   Cardiac Enzymes:  Recent Labs Lab 03/05/16 1555  CKTOTAL 511*  TROPONINI 0.07*   BNP (last 3 results) No results for input(s): PROBNP in the last 8760 hours. HbA1C: No results for input(s): HGBA1C in the last 72 hours. CBG: No results for input(s): GLUCAP in the last 168 hours. Lipid Profile:  Recent Labs  03/06/16 0916  CHOL 74  HDL 27*  LDLCALC 37  TRIG 51  CHOLHDL 2.7   Thyroid Function Tests: No results for input(s): TSH, T4TOTAL, FREET4, T3FREE,  THYROIDAB in the last 72 hours. Anemia Panel: No results for input(s): VITAMINB12, FOLATE, FERRITIN, TIBC, IRON, RETICCTPCT in the last 72 hours. Sepsis Labs:  Recent Labs Lab 03/05/16 1555 03/05/16 1742  LATICACIDVEN 1.6 1.5    Recent Results (from the past 240 hour(s))  MRSA PCR Screening     Status: None   Collection Time: 03/07/16  9:28 AM  Result Value Ref Range Status   MRSA by PCR NEGATIVE NEGATIVE Final    Comment:        The GeneXpert MRSA Assay (FDA approved for NASAL specimens only), is one component of a comprehensive MRSA colonization surveillance program. It is not intended to diagnose MRSA infection nor to guide or monitor treatment for MRSA infections.          Radiology  Studies: Ct Angio Head W/cm &/or Wo Cm  03/06/2016  CLINICAL DATA:  Stroke EXAM: CT ANGIOGRAPHY HEAD AND NECK TECHNIQUE: Multidetector CT imaging of the head and neck was performed using the standard protocol during bolus administration of intravenous contrast. Multiplanar CT image reconstructions and MIPs were obtained to evaluate the vascular anatomy. Carotid stenosis measurements (when applicable) are obtained utilizing NASCET criteria, using the distal internal carotid diameter as the denominator. CONTRAST:  50 mL Isovue 370 IV COMPARISON:  CT head 03/05/2016.  MRI 11/04/2013 and 06/11/2012 FINDINGS: Suboptimal arterial opacification due to timing of the injection. CTA NECK Aortic arch: Minimal atherosclerotic disease in the aortic arch. Proximal great vessels patent without significant stenosis. Lung apices clear.  No mass or adenopathy in the upper mediastinum. Right carotid system: Right common carotid artery widely patent. Atherosclerotic calcification at the bifurcation. 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 This appears chronic and was present in 2013. Left carotid system: Left common carotid artery widely patent. Atherosclerotic disease in the distal common  carotid artery. Metal stent across the carotid bifurcation. The stent is patent. Vertebral arteries:Proximal right vertebral artery is occluded with reconstitution in the mid neck. Moderate stenosis distal right vertebral artery. Left vertebral artery is patent to the basilar with scattered atherosclerotic disease. Skeleton: Cervical spondylosis.  No fracture or bone lesion. Other neck: No mass or adenopathy in the neck. Mucosal edema paranasal sinuses. CTA HEAD Anterior circulation: Right cavernous carotid is occluded which is chronic. There is reconstitution distally above the clinoid. Atherosclerotic calcification in the left cavernous carotid with mild stenosis. Anterior and middle cerebral arteries are poorly evaluated due to suboptimal opacification. These vessels appear patent Posterior circulation: Both vertebral arteries contribute to the basilar. PICA patent. Basilar patent. Posterior cerebral arteries patent bilaterally but not well evaluated due to poor opacification Venous sinuses: Patent Anatomic variants: None Delayed phase: Normal enhancement. Hypodensity in the high right parietal lobe as noted previously. This is slightly more prominent compared with yesterday and may represent subacute infarction. Negative for hemorrhage. IMPRESSION: Unfortunately, there is suboptimal arterial opacification due to timing of the injection. 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 Occlusion proximal right vertebral artery which reconstitutes in the mid neck. High right frontal cortical hypodensity is more prominent today and may represent subacute infarct Electronically Signed   By: Marlan Palau M.D.   On: 03/06/2016 12:24   Ct Head Wo Contrast  03/05/2016  CLINICAL DATA:  Left hemi neglect EXAM: CT HEAD WITHOUT CONTRAST TECHNIQUE: Contiguous axial images were obtained from the base of the skull through the vertex without intravenous contrast. COMPARISON:  MRI  11/04/2013 FINDINGS: Moderate atrophy. Diffuse ventricular enlargement unchanged from the prior MRI. This may be due to communicating hydrocephalus versus atrophy. Ill-defined hypodensity in the high right parietal lobe over the convexity. This is most likely volume averaging of a prominent sulcus which appears similar to the prior MRI. Acute infarct considered less likely given the appearance. Otherwise no acute infarct. Negative for hemorrhage or mass lesion. IMPRESSION: Generalized atrophy with moderate ventricular enlargement, stable from the prior MRI 11/04/2013. Ventricular enlargement raises the possibility of communicating hydrocephalus versus atrophy Hypodensity high right frontal lobe most likely volume averaging of a prominent sulcus. Electronically Signed   By: Marlan Palau M.D.   On: 03/05/2016 16:31   Ct Angio Neck W/cm &/or Wo/cm  03/06/2016  CLINICAL DATA:  Stroke EXAM: CT ANGIOGRAPHY HEAD AND NECK TECHNIQUE: Multidetector CT imaging of  the head and neck was performed using the standard protocol during bolus administration of intravenous contrast. Multiplanar CT image reconstructions and MIPs were obtained to evaluate the vascular anatomy. Carotid stenosis measurements (when applicable) are obtained utilizing NASCET criteria, using the distal internal carotid diameter as the denominator. CONTRAST:  50 mL Isovue 370 IV COMPARISON:  CT head 03/05/2016.  MRI 11/04/2013 and 06/11/2012 FINDINGS: Suboptimal arterial opacification due to timing of the injection. CTA NECK Aortic arch: Minimal atherosclerotic disease in the aortic arch. Proximal great vessels patent without significant stenosis. Lung apices clear.  No mass or adenopathy in the upper mediastinum. Right carotid system: Right common carotid artery widely patent. Atherosclerotic calcification at the bifurcation. 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 This appears chronic and was present in 2013. Left carotid system:  Left common carotid artery widely patent. Atherosclerotic disease in the distal common carotid artery. Metal stent across the carotid bifurcation. The stent is patent. Vertebral arteries:Proximal right vertebral artery is occluded with reconstitution in the mid neck. Moderate stenosis distal right vertebral artery. Left vertebral artery is patent to the basilar with scattered atherosclerotic disease. Skeleton: Cervical spondylosis.  No fracture or bone lesion. Other neck: No mass or adenopathy in the neck. Mucosal edema paranasal sinuses. CTA HEAD Anterior circulation: Right cavernous carotid is occluded which is chronic. There is reconstitution distally above the clinoid. Atherosclerotic calcification in the left cavernous carotid with mild stenosis. Anterior and middle cerebral arteries are poorly evaluated due to suboptimal opacification. These vessels appear patent Posterior circulation: Both vertebral arteries contribute to the basilar. PICA patent. Basilar patent. Posterior cerebral arteries patent bilaterally but not well evaluated due to poor opacification Venous sinuses: Patent Anatomic variants: None Delayed phase: Normal enhancement. Hypodensity in the high right parietal lobe as noted previously. This is slightly more prominent compared with yesterday and may represent subacute infarction. Negative for hemorrhage. IMPRESSION: Unfortunately, there is suboptimal arterial opacification due to timing of the injection. 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 Occlusion proximal right vertebral artery which reconstitutes in the mid neck. High right frontal cortical hypodensity is more prominent today and may represent subacute infarct Electronically Signed   By: Marlan Palauharles  Clark M.D.   On: 03/06/2016 12:24   Mr Brain Wo Contrast  03/06/2016  CLINICAL DATA:  Acute onset of left hemiplegia. Last seen normal 2 days ago. EXAM: MRI HEAD WITHOUT CONTRAST TECHNIQUE:  Multiplanar, multiecho pulse sequences of the brain and surrounding structures were obtained without intravenous contrast. COMPARISON:  CT 03/05/2016.  CTA 03/06/2016. FINDINGS: Diffusion imaging and FLAIR axial sequences performed. The study suffers from motion degradation. There are scattered areas of acute infarction in the right middle cerebral artery territory. There are small areas of acute infarction in the right basal ganglia and along the insular cortex. There is a 1-2 cm region of acute infarction in the deep right frontal white matter. There is cortical and subcortical infarction in the right posterior frontal and frontoparietal junction region. There is mild swelling but no mass effect or shift. No hemorrhage is detected on these limited sequences. Elsewhere, the brain shows generalized atrophy. Chronic small-vessel changes of the deep white matter. Ventricular prominence secondary to generalized atrophy. IMPRESSION: Acute infarctions scattered in the right middle cerebral artery territory as outlined above. Most extensive involvement is at the posterior frontal and frontoparietal junction cortical and subcortical brain. No mass effect or shift. Electronically Signed   By: Scherrie BatemanMark  Shogry M.D.  On: 03/06/2016 15:59   US Abdomen Complete  03/07/2016  CLINICAL DATA:  Alcoholic with fatty liver, pancreatitis, ascites by ultrasound and CT in 2011. Assess for progression to cirrhosis and recurrent ascites. EXAM: ABDOMEN ULTRASOUND COMPLETE COMPARISON:  02/16/2014 FINDINGS: Gallbladder: Gallbladder has a normal appearance. Gallbladder wall is 1.5 mm, within normal limits. No stones or pericholecystic fluid. No sonographic Murphy's sign. Common bile duct: Diameter: 5.8 mm Liver: The liver is echogenic. There is attenuation of the ultrasound wave, poor visualization of the internal hepatic architecture, and loss of definition of the diaphragm. No focal liver lesions are identified. IVC: No abnormality  visualized. Pancreas: Not well seen because of bowel gas. Spleen: Not well seen because of bowel gas. Right Kidney: Length: 13.6 cm. Echogenicity within normal limits. No mass or hydronephrosis visualized. Left Kidney: Length: 13.3 cm . Echogenicity within normal limits. No mass or hydronephrosis visualized. Abdominal aorta: Proximal aspect not well seen. Visualized portion is not aneurysmal, 2.4 cm. Other findings: No ascites identified. Study quality is degraded by overlying bowel gas, and level of patient cooperation. IMPRESSION: 1. Hepatic steatosis.  No focal liver lesion. 2. No ascites. Electronically Signed   By: Norva Pavlov M.D.   On: 03/07/2016 11:08        Scheduled Meds: . antiseptic oral rinse  7 mL Mouth Rinse q12n4p  . chlorhexidine  15 mL Mouth Rinse BID  . pantoprazole (PROTONIX) IV  40 mg Intravenous Q12H  . sodium chloride flush  3 mL Intravenous Q12H   Continuous Infusions: . sodium chloride 75 mL/hr at 03/07/16 0750     LOS: 2 days       Coralie Keens, MD [facility] Pager [phone]  If 7PM-7AM, please contact night-coverage [email or web address] Password [email or web address] 03/07/2016, 4:01 PM

## 2016-03-08 ENCOUNTER — Encounter (HOSPITAL_COMMUNITY): Payer: Self-pay | Admitting: Anesthesiology

## 2016-03-08 ENCOUNTER — Inpatient Hospital Stay (HOSPITAL_COMMUNITY): Payer: Medicaid Other | Admitting: Anesthesiology

## 2016-03-08 ENCOUNTER — Encounter (HOSPITAL_COMMUNITY)
Admission: EM | Disposition: A | Discharge status: Rehabilitation Facility | Payer: Self-pay | Source: Home / Self Care | Attending: Internal Medicine

## 2016-03-08 ENCOUNTER — Inpatient Hospital Stay (HOSPITAL_COMMUNITY): Payer: Medicaid Other

## 2016-03-08 DIAGNOSIS — F1010 Alcohol abuse, uncomplicated: Secondary | ICD-10-CM | POA: Diagnosis present

## 2016-03-08 DIAGNOSIS — I864 Gastric varices: Secondary | ICD-10-CM | POA: Diagnosis present

## 2016-03-08 DIAGNOSIS — D649 Anemia, unspecified: Secondary | ICD-10-CM

## 2016-03-08 HISTORY — PX: ESOPHAGOGASTRODUODENOSCOPY: SHX5428

## 2016-03-08 LAB — CBC WITH DIFFERENTIAL/PLATELET
BASOS ABS: 0 10*3/uL (ref 0.0–0.1)
Basophils Relative: 0 %
Eosinophils Absolute: 0.1 10*3/uL (ref 0.0–0.7)
Eosinophils Relative: 1 %
HEMATOCRIT: 23.5 % — AB (ref 39.0–52.0)
HEMOGLOBIN: 7.8 g/dL — AB (ref 13.0–17.0)
LYMPHS PCT: 15 %
Lymphs Abs: 1.2 10*3/uL (ref 0.7–4.0)
MCH: 29.2 pg (ref 26.0–34.0)
MCHC: 33.2 g/dL (ref 30.0–36.0)
MCV: 88 fL (ref 78.0–100.0)
MONO ABS: 0.7 10*3/uL (ref 0.1–1.0)
Monocytes Relative: 9 %
NEUTROS ABS: 5.9 10*3/uL (ref 1.7–7.7)
Neutrophils Relative %: 74 %
Platelets: 192 10*3/uL (ref 150–400)
RBC: 2.67 MIL/uL — AB (ref 4.22–5.81)
RDW: 17.5 % — AB (ref 11.5–15.5)
WBC: 7.9 10*3/uL (ref 4.0–10.5)

## 2016-03-08 LAB — COMPREHENSIVE METABOLIC PANEL
ALK PHOS: 74 U/L (ref 38–126)
ALT: 601 U/L — AB (ref 17–63)
AST: 423 U/L — AB (ref 15–41)
Albumin: 2.5 g/dL — ABNORMAL LOW (ref 3.5–5.0)
Anion gap: 5 (ref 5–15)
BUN: 7 mg/dL (ref 6–20)
CALCIUM: 7.9 mg/dL — AB (ref 8.9–10.3)
CO2: 23 mmol/L (ref 22–32)
CREATININE: 0.57 mg/dL — AB (ref 0.61–1.24)
Chloride: 103 mmol/L (ref 101–111)
GFR calc non Af Amer: >60 mL/min (ref >60)
GLUCOSE: 134 mg/dL — AB (ref 65–99)
Potassium: 3.1 mmol/L — ABNORMAL LOW (ref 3.5–5.1)
SODIUM: 131 mmol/L — AB (ref 135–145)
Total Bilirubin: 1.4 mg/dL — ABNORMAL HIGH (ref 0.3–1.2)
Total Protein: 5.1 g/dL — ABNORMAL LOW (ref 6.5–8.1)

## 2016-03-08 LAB — TYPE AND SCREEN
ABO/RH(D): O POS
Antibody Screen: NEGATIVE
Unit division: 0
Unit division: 0
Unit division: 0
Unit division: 0

## 2016-03-08 SURGERY — EGD (ESOPHAGOGASTRODUODENOSCOPY)
Anesthesia: Monitor Anesthesia Care

## 2016-03-08 MED ORDER — SODIUM CHLORIDE 0.9 % IV SOLN
50.0000 ug/h | INTRAVENOUS | Status: DC
Start: 2016-03-08 — End: 2016-03-08
  Administered 2016-03-08: 50 ug/h via INTRAVENOUS
  Filled 2016-03-08 (×4): qty 1

## 2016-03-08 MED ORDER — PROPOFOL 500 MG/50ML IV EMUL
INTRAVENOUS | Status: DC | PRN
  Administered 2016-03-08: 100 ug/kg/min via INTRAVENOUS

## 2016-03-08 MED ORDER — NICOTINE 21 MG/24HR TD PT24
21.0000 mg | MEDICATED_PATCH | Freq: Every day | TRANSDERMAL | Status: DC
  Administered 2016-03-08 – 2016-03-12 (×5): 21 mg via TRANSDERMAL
  Filled 2016-03-08 (×5): qty 1

## 2016-03-08 MED ORDER — LACTATED RINGERS IV SOLN: INTRAVENOUS | Status: DC

## 2016-03-08 MED ORDER — IOPAMIDOL (ISOVUE-300) INJECTION 61%
INTRAVENOUS | Status: AC
  Administered 2016-03-08: 100 mL
  Filled 2016-03-08: qty 100

## 2016-03-08 MED ORDER — KCL IN DEXTROSE-NACL 20-5-0.45 MEQ/L-%-% IV SOLN
INTRAVENOUS | Status: DC
  Administered 2016-03-08 – 2016-03-09 (×2): via INTRAVENOUS
  Filled 2016-03-08 (×2): qty 1000

## 2016-03-08 MED ORDER — MIDAZOLAM HCL 5 MG/5ML IJ SOLN
INTRAMUSCULAR | Status: DC | PRN
  Administered 2016-03-08: 2 mg via INTRAVENOUS

## 2016-03-08 MED ORDER — POTASSIUM CHLORIDE 10 MEQ/100ML IV SOLN
10.0000 meq | INTRAVENOUS | Status: AC
  Administered 2016-03-08 (×2): 10 meq via INTRAVENOUS

## 2016-03-08 MED ORDER — OCTREOTIDE LOAD VIA INFUSION
50.0000 ug | Freq: Once | INTRAVENOUS | Status: AC
  Administered 2016-03-08: 50 ug via INTRAVENOUS
  Filled 2016-03-08: qty 25

## 2016-03-08 MED ORDER — THIAMINE HCL 100 MG/ML IJ SOLN
100.0000 mg | Freq: Every day | INTRAMUSCULAR | Status: DC
Start: 2016-03-08 — End: 2016-03-11
  Administered 2016-03-08 – 2016-03-10 (×3): 100 mg via INTRAVENOUS
  Filled 2016-03-08 (×3): qty 2

## 2016-03-08 MED ORDER — FOLIC ACID 5 MG/ML IJ SOLN
1.0000 mg | Freq: Every day | INTRAMUSCULAR | Status: DC
Start: 2016-03-08 — End: 2016-03-11
  Administered 2016-03-08 – 2016-03-10 (×3): 1 mg via INTRAVENOUS
  Filled 2016-03-08 (×5): qty 0.2

## 2016-03-08 MED ORDER — LACTATED RINGERS IV SOLN
INTRAVENOUS | Status: DC | PRN
  Administered 2016-03-08: 09:00:00 via INTRAVENOUS

## 2016-03-08 MED ORDER — PROPOFOL 10 MG/ML IV BOLUS
INTRAVENOUS | Status: DC | PRN
  Administered 2016-03-08: 20 mg via INTRAVENOUS
  Administered 2016-03-08: 30 mg via INTRAVENOUS

## 2016-03-08 MED ORDER — POTASSIUM CHLORIDE 10 MEQ/100ML IV SOLN
10.0000 meq | INTRAVENOUS | Status: DC
  Administered 2016-03-08 (×2): 10 meq via INTRAVENOUS
  Filled 2016-03-08: qty 100

## 2016-03-08 MED ORDER — FENTANYL CITRATE (PF) 100 MCG/2ML IJ SOLN: 25.0000 ug | INTRAMUSCULAR | Status: DC | PRN

## 2016-03-08 MED ORDER — SODIUM CHLORIDE 0.9 % IV SOLN: INTRAVENOUS | Status: DC

## 2016-03-08 MED ORDER — LORAZEPAM 2 MG/ML IJ SOLN
2.0000 mg | INTRAMUSCULAR | Status: DC | PRN
  Administered 2016-03-10: 2 mg via INTRAVENOUS
  Administered 2016-03-10: 3 mg via INTRAVENOUS
  Filled 2016-03-08: qty 2
  Filled 2016-03-08: qty 1

## 2016-03-08 MED ORDER — BUTAMBEN-TETRACAINE-BENZOCAINE 2-2-14 % EX AERO
INHALATION_SPRAY | CUTANEOUS | Status: DC | PRN
Start: 2016-03-08 — End: 2016-03-08
  Administered 2016-03-08: 2 via TOPICAL

## 2016-03-08 MED ORDER — MORPHINE SULFATE (PF) 2 MG/ML IV SOLN
2.0000 mg | INTRAVENOUS | Status: DC | PRN
  Administered 2016-03-08 – 2016-03-11 (×3): 2 mg via INTRAVENOUS
  Filled 2016-03-08 (×3): qty 1

## 2016-03-08 NOTE — Progress Notes (Signed)
PT Cancellation Note  Patient Details Name: Marcelle SmilingKeith W Holstrom MRN: [id number] DOB: [birth year]   Cancelled Treatment:    Reason Eval/Treat Not Completed: Patient not medically ready (strict bedrest orders active)   Fabio AsaWerner, Ichiro Chesnut J 03/08/2016, 4:33 PM Charlotte Crumbevon Ellayna Hilligoss, PT DPT  [phone]

## 2016-03-08 NOTE — H&P (View-Only) (Signed)
Daily Rounding Note  03/07/2016, 10:38 AM  LOS: 2 days   SUBJECTIVE:       Last BM was yesterday it was dark.  No n/v.  Still npo as no speech eval ordered  OBJECTIVE:         Vital signs in last 24 hours:    Temp:  [97.7 F (36.5 C)-99.3 F (37.4 C)] 98.6 F (37 C) (06/15 0736) Pulse Rate:  [80-101] 80 (06/15 0700) Resp:  [10-24] 18 (06/15 0736) BP: (164-184)/(78-100) 172/100 mmHg (06/15 0736) SpO2:  [97 %-100 %] 100 % (06/15 0736) Last BM Date: 03/05/16 Filed Weights   03/05/16 2333  Weight: 87.3 kg (192 lb 7.4 oz)   General: looks chronically unwelll   Heart: RRR Chest: clear bil.  No cough Abdomen: soft,  NT.  Active BS  Extremities: no CCE Neuro/Psych:  Weakness in left leg.   ? Asterixis in right arm vs tremor.   Intake/Output from previous day: 06/14 0701 - 06/15 0700 In: 2038.3 [I.V.:1867.5; Blood:170.8] Out: 750 [Urine:750]  Intake/Output this shift: Total I/O In: -  Out: 625 [Urine:625]  Lab Results:  Recent Labs  03/05/16 1555 03/06/16 0529 03/06/16 0916 03/06/16 1628 03/07/16 0213  WBC 13.2* 14.3*  --   --  11.5*  HGB 4.8* 7.4* 7.1* 7.2* 6.6*  6.6*  HCT 14.8* 22.2* 21.5* 22.0* 20.5*  20.8*  PLT 192 158  --   --  178   BMET  Recent Labs  03/05/16 1555 03/06/16 0529 03/07/16 0213  NA 127* 134* 138  K 3.5 2.9* 3.2*  CL 92* 100* 107  CO2 23 24 22   GLUCOSE 223* 165* 126*  BUN 66* 32* 17  CREATININE 1.19 0.81 0.66  CALCIUM 8.5* 8.2* 8.0*   LFT  Recent Labs  03/06/16 0529 03/06/16 1857  PROT 5.4* 5.3*  ALBUMIN 2.7* 2.6*  AST 780* 1188*  ALT 544* 816*  ALKPHOS 65 68  BILITOT 1.9* 1.4*  BILIDIR  --  0.5  IBILI  --  0.9   PT/INR  Recent Labs  03/06/16 0529  LABPROT 15.6*  INR 1.22   Hepatitis Panel  Recent Labs  03/06/16 1857  HEPBSAG Negative  HCVAB <0.1  HEPAIGM Negative  HEPBIGM Negative    Studies/Results: Ct Angio Head W/cm &/or Wo  Cm  03/06/2016  CLINICAL DATA:  Stroke EXAM: CT ANGIOGRAPHY HEAD AND NECK TECHNIQUE: Multidetector CT imaging of the head and neck was performed using the standard protocol during bolus administration of intravenous contrast. Multiplanar CT image reconstructions and MIPs were obtained to evaluate the vascular anatomy. Carotid stenosis measurements (when applicable) are obtained utilizing NASCET criteria, using the distal internal carotid diameter as the denominator. CONTRAST:  50 mL Isovue 370 IV COMPARISON:  CT head 03/05/2016.  MRI 11/04/2013 and 06/11/2012 FINDINGS: Suboptimal arterial opacification due to timing of the injection. CTA NECK Aortic arch: Minimal atherosclerotic disease in the aortic arch. Proximal great vessels patent without significant stenosis. Lung apices clear.  No mass or adenopathy in the upper mediastinum. Right carotid system: Right common carotid artery widely patent. Atherosclerotic calcification at the bifurcation. 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 This appears chronic and was present in 2013. Left carotid system: Left common carotid artery widely patent. Atherosclerotic disease in the distal common carotid artery. Metal stent across the carotid bifurcation. The stent is patent. Vertebral arteries:Proximal right vertebral artery is occluded with reconstitution in the mid neck. Moderate  stenosis distal right vertebral artery. Left vertebral artery is patent to the basilar with scattered atherosclerotic disease. Skeleton: Cervical spondylosis.  No fracture or bone lesion. Other neck: No mass or adenopathy in the neck. Mucosal edema paranasal sinuses. CTA HEAD Anterior circulation: Right cavernous carotid is occluded which is chronic. There is reconstitution distally above the clinoid. Atherosclerotic calcification in the left cavernous carotid with mild stenosis. Anterior and middle cerebral arteries are poorly evaluated due to suboptimal opacification. These  vessels appear patent Posterior circulation: Both vertebral arteries contribute to the basilar. PICA patent. Basilar patent. Posterior cerebral arteries patent bilaterally but not well evaluated due to poor opacification Venous sinuses: Patent Anatomic variants: None Delayed phase: Normal enhancement. Hypodensity in the high right parietal lobe as noted previously. This is slightly more prominent compared with yesterday and may represent subacute infarction. Negative for hemorrhage. IMPRESSION: Unfortunately, there is suboptimal arterial opacification due to timing of the injection. 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 Occlusion proximal right vertebral artery which reconstitutes in the mid neck. High right frontal cortical hypodensity is more prominent today and may represent subacute infarct Electronically Signed   By: Marlan Palau M.D.   On: 03/06/2016 12:24   Ct Head Wo Contrast  03/05/2016  CLINICAL DATA:  Left hemi neglect EXAM: CT HEAD WITHOUT CONTRAST TECHNIQUE: Contiguous axial images were obtained from the base of the skull through the vertex without intravenous contrast. COMPARISON:  MRI 11/04/2013 FINDINGS: Moderate atrophy. Diffuse ventricular enlargement unchanged from the prior MRI. This may be due to communicating hydrocephalus versus atrophy. Ill-defined hypodensity in the high right parietal lobe over the convexity. This is most likely volume averaging of a prominent sulcus which appears similar to the prior MRI. Acute infarct considered less likely given the appearance. Otherwise no acute infarct. Negative for hemorrhage or mass lesion. IMPRESSION: Generalized atrophy with moderate ventricular enlargement, stable from the prior MRI 11/04/2013. Ventricular enlargement raises the possibility of communicating hydrocephalus versus atrophy Hypodensity high right frontal lobe most likely volume averaging of a prominent sulcus. Electronically Signed    By: Marlan Palau M.D.   On: 03/05/2016 16:31   Ct Angio Neck W/cm &/or Wo/cm  03/06/2016  CLINICAL DATA:  Stroke EXAM: CT ANGIOGRAPHY HEAD AND NECK TECHNIQUE: Multidetector CT imaging of the head and neck was performed using the standard protocol during bolus administration of intravenous contrast. Multiplanar CT image reconstructions and MIPs were obtained to evaluate the vascular anatomy. Carotid stenosis measurements (when applicable) are obtained utilizing NASCET criteria, using the distal internal carotid diameter as the denominator. CONTRAST:  50 mL Isovue 370 IV COMPARISON:  CT head 03/05/2016.  MRI 11/04/2013 and 06/11/2012 FINDINGS: Suboptimal arterial opacification due to timing of the injection. CTA NECK Aortic arch: Minimal atherosclerotic disease in the aortic arch. Proximal great vessels patent without significant stenosis. Lung apices clear.  No mass or adenopathy in the upper mediastinum. Right carotid system: Right common carotid artery widely patent. Atherosclerotic calcification at the bifurcation. 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 This appears chronic and was present in 2013. Left carotid system: Left common carotid artery widely patent. Atherosclerotic disease in the distal common carotid artery. Metal stent across the carotid bifurcation. The stent is patent. Vertebral arteries:Proximal right vertebral artery is occluded with reconstitution in the mid neck. Moderate stenosis distal right vertebral artery. Left vertebral artery is patent to the basilar with scattered atherosclerotic disease. Skeleton: Cervical spondylosis.  No fracture or bone lesion.  Other neck: No mass or adenopathy in the neck. Mucosal edema paranasal sinuses. CTA HEAD Anterior circulation: Right cavernous carotid is occluded which is chronic. There is reconstitution distally above the clinoid. Atherosclerotic calcification in the left cavernous carotid with mild stenosis. Anterior and middle  cerebral arteries are poorly evaluated due to suboptimal opacification. These vessels appear patent Posterior circulation: Both vertebral arteries contribute to the basilar. PICA patent. Basilar patent. Posterior cerebral arteries patent bilaterally but not well evaluated due to poor opacification Venous sinuses: Patent Anatomic variants: None Delayed phase: Normal enhancement. Hypodensity in the high right parietal lobe as noted previously. This is slightly more prominent compared with yesterday and may represent subacute infarction. Negative for hemorrhage. IMPRESSION: Unfortunately, there is suboptimal arterial opacification due to timing of the injection. 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 Occlusion proximal right vertebral artery which reconstitutes in the mid neck. High right frontal cortical hypodensity is more prominent today and may represent subacute infarct Electronically Signed   By: Marlan Palau M.D.   On: 03/06/2016 12:24   Mr Brain Wo Contrast  03/06/2016  CLINICAL DATA:  Acute onset of left hemiplegia. Last seen normal 2 days ago. EXAM: MRI HEAD WITHOUT CONTRAST TECHNIQUE: Multiplanar, multiecho pulse sequences of the brain and surrounding structures were obtained without intravenous contrast. COMPARISON:  CT 03/05/2016.  CTA 03/06/2016. FINDINGS: Diffusion imaging and FLAIR axial sequences performed. The study suffers from motion degradation. There are scattered areas of acute infarction in the right middle cerebral artery territory. There are small areas of acute infarction in the right basal ganglia and along the insular cortex. There is a 1-2 cm region of acute infarction in the deep right frontal white matter. There is cortical and subcortical infarction in the right posterior frontal and frontoparietal junction region. There is mild swelling but no mass effect or shift. No hemorrhage is detected on these limited sequences. Elsewhere, the  brain shows generalized atrophy. Chronic small-vessel changes of the deep white matter. Ventricular prominence secondary to generalized atrophy. IMPRESSION: Acute infarctions scattered in the right middle cerebral artery territory as outlined above. Most extensive involvement is at the posterior frontal and frontoparietal junction cortical and subcortical brain. No mass effect or shift. Electronically Signed   By: Paulina Fusi M.D.   On: 03/06/2016 15:59   Scheduled Meds: . antiseptic oral rinse  7 mL Mouth Rinse q12n4p  . chlorhexidine  15 mL Mouth Rinse BID  . pantoprazole (PROTONIX) IV  40 mg Intravenous Q12H  . sodium chloride flush  3 mL Intravenous Q12H   Continuous Infusions: . sodium chloride 75 mL/hr at 03/07/16 0750   PRN Meds:.morphine injection, ondansetron (ZOFRAN) IV   ASSESMENT:   * GI bleeding. Dark, FOBT + stool per rectal exam. Incontinent at home. Rule out ulcers, rule out portal gastropathy, variceal bleeding. BUN of 32, c/w upper GIB. On BID IV Protonix.    * Blood loss anemia. S/p PRBC x 4.   * AMS, CVA.  CT angio with ? Subacute right cortical infarct. MRI: right MCA territory infarcts. Mental status improved.EEG normal.   * Elevated transaminases, ? Shock liver and/or ETOH hepatitis.  Coags normal. Ammonia normal.   * Fatigue, doe. Cardiac stress test on 6/9. Apparently unremarkable per pt and his sister, but do not have access to [facility] reports to confirm this. Wonder if sxs were/are due to his anemia. .   * CAD. DES in 02/2015, on chronic Plavix and ASA. Due to  anemia and FOBT +, these are both on hold.   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     PLAN   *  EGD set up for tomorrow at ~ 0915.    *  LFTs in AM.  CBC q 8 hours.   *  Do not see SLP eval and pt is npo. I ordered SLP eval.       Jennye Moccasin  03/07/2016, 10:38 AM Pager: [phone]

## 2016-03-08 NOTE — Anesthesia Procedure Notes (Signed)
Procedure Name: MAC Date/Time: 03/08/2016 9:26 AM Performed by: Lovie CholOCK, Braelyn Bordonaro K Pre-anesthesia Checklist: Patient identified, Emergency Drugs available, Suction available, Patient being monitored and Timeout performed Patient Re-evaluated:Patient Re-evaluated prior to induction

## 2016-03-08 NOTE — Op Note (Signed)
[facility] Patient Name: Brandon [name] Procedure Date : 03/08/2016 MRN: [id number] Attending MD: Willaim Rayas. Adela Lank , MD Date of Birth: September 21, 1966 CSN: [id number] Age: 50 Admit Type: Inpatient Procedure:                Upper GI endoscopy Indications:              Recent gastrointestinal bleeding, Acute post                            hemorrhagic anemia, Melena. History of chronic                            pancreatitis, US of the liver does not show                            evidence of cirrhosis Providers:                Brandon Spare P. Adela Lank, MD, Tomma Rakers, RN,                            Arlee Muslim, Technician, Romie Minus, CRNA Referring MD:              Medicines:                Monitored Anesthesia Care Complications:            No immediate complications. Estimated blood loss:                            None. Estimated Blood Loss:     Estimated blood loss: none. Procedure:                Pre-Anesthesia Assessment:                           - Prior to the procedure, a History and Physical                            was performed, and patient medications and                            allergies were reviewed. The patient's tolerance of                            previous anesthesia was also reviewed. The risks                            and benefits of the procedure and the sedation                            options and risks were discussed with the patient.                            All questions were answered, and informed consent  was obtained. Prior Anticoagulants: The patient has                            taken Plavix (clopidogrel), last dose was 5 days                            prior to procedure. ASA Grade Assessment: III - A                            patient with severe systemic disease. After                            reviewing the risks and benefits, the patient was                            deemed in  satisfactory condition to undergo the                            procedure.                           After obtaining informed consent, the endoscope was                            passed under direct vision. Throughout the                            procedure, the patient's blood pressure, pulse, and                            oxygen saturations were monitored continuously. The                            EG-[id number] ([id number]) scope was introduced through the                            mouth, and advanced to the duodenal bulb. The upper                            GI endoscopy was accomplished without difficulty.                            The patient tolerated the procedure well. Scope In: Scope Out: Findings:      Esophagogastric landmarks were identified: the Z-line was found at 41       cm, the gastroesophageal junction was found at 41 cm and the upper       extent of the gastric folds was found at 41 cm from the incisors.      The exam of the esophagus was otherwise normal. No esophageal varices       noted.      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 They were large in largest diameter. There was a small       erythematous spot  on on the varix, but no high risk stigmata of       bleeding. No old or red blood in the stomach      The exam of the stomach was otherwise normal.      The duodenal bulb and second portion of the duodenum were normal. Impression:               - Esophagogastric landmarks identified.                           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                           - Normal duodenal bulb and second portion of the                            duodenum.                           Overall, the patient does not clearly have a                            history of cirrhosis but does have chronic                            pancreatitis. I suspect the lesion noted on this                             exam represents gastric varix. With his history of                            chronic pancreatitis, if this lesion is gastric                            varices, he could have splenic vein thrombosis                            driving this picture and recommend imaging of the                            pancreas / splenic vasculature to further evaluate.                            We can start on octreotide at this time empirically                            but do not think this patient has cirrhosis based                            on [place] result thus far. If gastric varices are                            thought to be the cause from splenic vein  thrombosis, treatment is splenectomy (not TIPS).                            Patient also has history of pancreatic                            pseudoaneurysm which can also cause massive GI                            bleeding, imaging will also evaluate for this. Moderate Sedation:      No moderate sedation, case performed with MAC Recommendation:           - Return patient to hospital ward for ongoing care.                           - NPO.                           - Continue present medications (IV proptonix)                           - CT abdomen / abdomen pelvis ASAP                           - Start empiric octreotide, will determine if this                            needs to be continued based on CT scan                           - GI service will continue to follow Procedure Code(s):        --- Professional ---                           [id number], Esophagogastroduodenoscopy, flexible,                            transoral; diagnostic, including collection of                            specimen(s) by brushing or washing, when performed                            (separate procedure) Diagnosis Code(s):        --- Professional ---                           I86.4, Gastric varices                           K92.2,  Gastrointestinal hemorrhage, unspecified                           D62, Acute posthemorrhagic anemia  K92.1, Melena (includes Hematochezia) CPT copyright 2016 [employer]. All rights reserved. The codes documented in this report are preliminary and upon coder review may  be revised to meet current compliance requirements. Brandon Spare P. Maecie Sevcik, MD 03/08/2016 10:01:07 AM This report has been signed electronically. Number of Addenda: 0

## 2016-03-08 NOTE — Progress Notes (Addendum)
PROGRESS NOTE    Brandon [name]  WUJ:[id number] DOB: 1966-06-22 DOA: 03/05/2016 PCP: Laurier Nancy, MD (Confirm with patient/family/NH records and if not entered, this HAS to be entered at [facility] point of entry. "No PCP" if truly none.)   Brief Narrative: (Start on day 1 of progress note - keep it brief and live) Acute blood loss anemia due to upper GI bleed complicated with acute CVA, 50 yo male with carotid stenosis and ischemic heart disease. Persistent left hemiparesis, no further bleeding, had 4 units prbc transfused. EGD with gastric varices, to rule out  Splenic vein thrombosis, on octreotide and protonix.   Assessment & Plan:   Active Problems:   CVA (cerebral infarction)   Pressure ulcer   Altered mental status   Left-sided neglect   Severe anemia   UGIB (upper gastrointestinal bleed)   1. Cardiovascular. Blood pressure systolic improved to 170's,. Will continue to use labetalol as needed. No further bleeding and hb/hct stable, s/p 4 units prbc transfusion since admission.   2,. Pulmonary. Will continue to monitor oxymetry, aspiration precautions, supplemental 02 per [place] to target 02 sat above 92%, no signs of volume overload. Head elevated 45 degrees at all times. Per swallow evaluation patient is allowed to eat.  3. Nephrology, Renal function with cr at 0.57. K at 3.1, and cl at 103 with serum bicarb at 23, patient continue npo per gi recommendations, will change fluids to dextrose with kcl, follow renal panel in am, avoid hypotension or anemia.   4, Neruology. Acute infarctions scattered in the right middle cerebral artery territory, per MRI. Positive carotid artery occlusion on the right, patent stent on the left, and right proximal vertebral occlusion.No anticoagulation due to bleeding. Clinically improved left hemiparesis. Patient with history of etho abuse, noted anxoius per nursing, will start on etho withdrawal protocol. Close neuro checks, will keep patient in step  down unit for now.   5. GI. EGD with gastric varices with no bleeding, will continue ppi and added octreotide, follow on ct of abdomen and pelvis to rule out splenic vein thrombosis.  6. dvt px  Patient continue to be at high risk for worsening anemia and cva   DVT prophylaxis:  Mechanical compression devices  Code Status:  full Family Communication:  Patient's sister  Disposition Plan: (specify when and where you expect patient to be discharged). Include barriers to DC in this tab.   Consultants:   Neurology  gastroeneterology  Procedures: (Don't include imaging studies which can be auto populated. Include things that cannot be auto populated i.e. Echo, Carotid and venous dopplers, Foley, Bipap, HD, tubes/drains, wound vac, central lines etc)  egd   Antimicrobials: (specify start and planned stop date. Auto populated tables are space occupying and do not give end dates)     Subjective: Patient awake and more orientated, asking to eat. Strength has improved on his left side, no chest pain or shortness of breath. No nausea or vomiting, no bleeding.   Objective: Filed Vitals:   03/08/16 1005 03/08/16 1010 03/08/16 1023 03/08/16 1135  BP:  134/62 149/90 169/96  Pulse: 71  70 66  Temp:   99 F (37.2 C) 98.3 F (36.8 C)  TempSrc:   Oral Oral  Resp: 12 14 12 16   Height:      Weight:      SpO2: 98% 98% 98% 97%    Intake/Output Summary (Last 24 hours) at 03/08/16 1420 Last data filed at 03/08/16 1135  Gross per 24  hour  Intake 2551.25 ml  Output   1950 ml  Net 601.25 ml   Filed Weights   03/05/16 2333  Weight: 87.3 kg (192 lb 7.4 oz)    Examination:  General exam: deconditioned, not in pain or dyspnea. E ENT: Positive conjunctival pallor, oral mucosa moist.  Respiratory system: Respiratory effort normal. No wheezing, rales or rhonchi. Poor inspiratory effort. Cardiovascular system: S1 & S2 heard, RRR. No JVD, murmurs, rubs, gallops or clicks. No pedal  edema. Gastrointestinal system: Abdomen is nondistended, soft and nontender. No organomegaly or masses felt. Normal bowel sounds heard. Central nervous system: Alert and oriented. No focal neurological deficits. Extremities: Symmetric 5 x 5 power. Skin: No rashes, lesions or ulcers Psychiatry: Judgement and insight appear normal. Mood & affect appropriate.     Data Reviewed: I have personally reviewed following labs and imaging studies  CBC:  Recent Labs Lab 03/05/16 1555 03/06/16 0529 03/06/16 0916 03/06/16 1628 03/07/16 0213 03/07/16 1030 03/08/16 0541  WBC 13.2* 14.3*  --   --  11.5*  --  7.9  NEUTROABS 11.5*  --   --   --  9.4*  --  5.9  HGB 4.8* 7.4* 7.1* 7.2* 6.6*  6.6* 7.8* 7.8*  HCT 14.8* 22.2* 21.5* 22.0* 20.5*  20.8* 23.7* 23.5*  MCV 92.5 90.2  --   --  91.6  --  88.0  PLT 192 158  --   --  178  --  192   Basic Metabolic Panel:  Recent Labs Lab 03/05/16 1555 03/06/16 0529 03/07/16 0213 03/08/16 0541  NA 127* 134* 138 131*  K 3.5 2.9* 3.2* 3.1*  CL 92* 100* 107 103  CO2 23 24 22 23   GLUCOSE 223* 165* 126* 134*  BUN 66* 32* 17 7  CREATININE 1.19 0.81 0.66 0.57*  CALCIUM 8.5* 8.2* 8.0* 7.9*  MG 2.3  --   --   --    GFR: Estimated Creatinine Clearance: 123.5 mL/min (by C-G formula based on Cr of 0.57). Liver Function Tests:  Recent Labs Lab 03/06/16 0529 03/06/16 1857 03/07/16 1433 03/08/16 0541  AST 780* 1188* 804* 423*  ALT 544* 816* 780* 601*  ALKPHOS 65 68 70 74  BILITOT 1.9* 1.4* 1.4* 1.4*  PROT 5.4* 5.3* 5.2* 5.1*  ALBUMIN 2.7* 2.6* 2.5* 2.5*   No results for input(s): LIPASE, AMYLASE in the last 168 hours.  Recent Labs Lab 03/06/16 1620  AMMONIA 13   Coagulation Profile:  Recent Labs Lab 03/06/16 0529 03/07/16 1433  INR 1.22 1.29   Cardiac Enzymes:  Recent Labs Lab 03/05/16 1555  CKTOTAL 511*  TROPONINI 0.07*   BNP (last 3 results) No results for input(s): PROBNP in the last 8760 hours. HbA1C: No results for  input(s): HGBA1C in the last 72 hours. CBG: No results for input(s): GLUCAP in the last 168 hours. Lipid Profile:  Recent Labs  03/06/16 0916  CHOL 74  HDL 27*  LDLCALC 37  TRIG 51  CHOLHDL 2.7   Thyroid Function Tests: No results for input(s): TSH, T4TOTAL, FREET4, T3FREE, THYROIDAB in the last 72 hours. Anemia Panel: No results for input(s): VITAMINB12, FOLATE, FERRITIN, TIBC, IRON, RETICCTPCT in the last 72 hours. Sepsis Labs:  Recent Labs Lab 03/05/16 1555 03/05/16 1742  LATICACIDVEN 1.6 1.5    Recent Results (from the past 240 hour(s))  MRSA PCR Screening     Status: None   Collection Time: 03/07/16  9:28 AM  Result Value Ref Range Status   MRSA by  PCR NEGATIVE NEGATIVE Final    Comment:        The GeneXpert MRSA Assay (FDA approved for NASAL specimens only), is one component of a comprehensive MRSA colonization surveillance program. It is not intended to diagnose MRSA infection nor to guide or monitor treatment for MRSA infections.          Radiology Studies: Mr Brain Wo Contrast  03/06/2016  CLINICAL DATA:  Acute onset of left hemiplegia. Last seen normal 2 days ago. EXAM: MRI HEAD WITHOUT CONTRAST TECHNIQUE: Multiplanar, multiecho pulse sequences of the brain and surrounding structures were obtained without intravenous contrast. COMPARISON:  CT 03/05/2016.  CTA 03/06/2016. FINDINGS: Diffusion imaging and FLAIR axial sequences performed. The study suffers from motion degradation. There are scattered areas of acute infarction in the right middle cerebral artery territory. There are small areas of acute infarction in the right basal ganglia and along the insular cortex. There is a 1-2 cm region of acute infarction in the deep right frontal white matter. There is cortical and subcortical infarction in the right posterior frontal and frontoparietal junction region. There is mild swelling but no mass effect or shift. No hemorrhage is detected on these limited  sequences. Elsewhere, the brain shows generalized atrophy. Chronic small-vessel changes of the deep white matter. Ventricular prominence secondary to generalized atrophy. IMPRESSION: Acute infarctions scattered in the right middle cerebral artery territory as outlined above. Most extensive involvement is at the posterior frontal and frontoparietal junction cortical and subcortical brain. No mass effect or shift. Electronically Signed   By: Paulina Fusi M.D.   On: 03/06/2016 15:59   US Abdomen Complete  03/07/2016  CLINICAL DATA:  Alcoholic with fatty liver, pancreatitis, ascites by ultrasound and CT in 2011. Assess for progression to cirrhosis and recurrent ascites. EXAM: ABDOMEN ULTRASOUND COMPLETE COMPARISON:  02/16/2014 FINDINGS: Gallbladder: Gallbladder has a normal appearance. Gallbladder wall is 1.5 mm, within normal limits. No stones or pericholecystic fluid. No sonographic Murphy's sign. Common bile duct: Diameter: 5.8 mm Liver: The liver is echogenic. There is attenuation of the ultrasound wave, poor visualization of the internal hepatic architecture, and loss of definition of the diaphragm. No focal liver lesions are identified. IVC: No abnormality visualized. Pancreas: Not well seen because of bowel gas. Spleen: Not well seen because of bowel gas. Right Kidney: Length: 13.6 cm. Echogenicity within normal limits. No mass or hydronephrosis visualized. Left Kidney: Length: 13.3 cm . Echogenicity within normal limits. No mass or hydronephrosis visualized. Abdominal aorta: Proximal aspect not well seen. Visualized portion is not aneurysmal, 2.4 cm. Other findings: No ascites identified. Study quality is degraded by overlying bowel gas, and level of patient cooperation. IMPRESSION: 1. Hepatic steatosis.  No focal liver lesion. 2. No ascites. Electronically Signed   By: Norva Pavlov M.D.   On: 03/07/2016 11:08        Scheduled Meds: . antiseptic oral rinse  7 mL Mouth Rinse q12n4p  .  chlorhexidine  15 mL Mouth Rinse BID  . pantoprazole (PROTONIX) IV  40 mg Intravenous Q12H  . sodium chloride flush  3 mL Intravenous Q12H   Continuous Infusions: . dextrose 5 % and 0.45 % NaCl with KCl 20 mEq/L    . octreotide  (SANDOSTATIN)    IV infusion 50 mcg/hr (03/08/16 1123)     LOS: 3 days        Hema Lanza Annett Gula, MD [facility] Pager [phone]  If 7PM-7AM, please contact night-coverage [email or web address] Password [email or web address] 03/08/2016, 2:20 PM

## 2016-03-08 NOTE — Transfer of Care (Signed)
Immediate Anesthesia Transfer of Care Note  Patient: Brandon [name]  Procedure(s) Performed: Procedure(s): ESOPHAGOGASTRODUODENOSCOPY (EGD) (N/A)  Patient Location: PACU  Anesthesia Type:General  Level of Consciousness: awake, oriented and patient cooperative  Airway & Oxygen Therapy: Patient Spontanous Breathing and Patient connected to nasal cannula oxygen  Post-op Assessment: Report given to RN and Post -op Vital signs reviewed and stable  Post vital signs: Reviewed  Last Vitals:  Filed Vitals:   03/08/16 0749 03/08/16 0805  BP: 165/85 168/97  Pulse: 84 73  Temp: 36.4 C 37.3 C  Resp: 17 18    Last Pain:  Filed Vitals:   03/08/16 0806  PainSc: 0-No pain         Complications: No apparent anesthesia complications

## 2016-03-08 NOTE — Progress Notes (Signed)
STROKE TEAM PROGRESS NOTE   HISTORY OF PRESENT ILLNESS (per record) Brandon [name] is an 50 y.o. male who presents with acute onset of left hemiplegia. He was last seen normal by his wife at 3 PM yesterday 03/04/2016, and last heard to be normal by phone at midnight. Today 03/05/2016, his son found him at home immobile on the couch covered in urine and stool. The patient was transported by EMS to the ED and exam revealed dense left sided weakness. He was out of the IV tPA and endovascular/interventional time windows. He was unable to get up due to feeling "dizzy" and felt as though he would fall when he "tried to walk". He has mild left sided residual deficits from prior stroke. Per EMS, when they tried to sit him up he lost consciousness and was incontinent again of stool. Patient was not administered IV t-PA secondary to delay in arrival. He was admitted to the stepdown unit for further evaluation and treatment.   SUBJECTIVE (INTERVAL HISTORY) His sister is at the bedside.  Overall he feels his condition is stable. He just returned from upper GI endoscopy which showed dialted gastric varices but no definite source of bleeding. Hematocrit is stable OBJECTIVE Temp:  [97.5 F (36.4 C)-99.1 F (37.3 C)] 98.3 F (36.8 C) (06/16 1135) Pulse Rate:  [66-97] 66 (06/16 1135) Cardiac Rhythm:  [-] Normal sinus rhythm (06/16 0749) Resp:  [11-21] 16 (06/16 1135) BP: (112-181)/(62-97) 169/96 mmHg (06/16 1135) SpO2:  [97 %-99 %] 97 % (06/16 1135)  CBC:   Recent Labs Lab 03/07/16 0213 03/07/16 1030 03/08/16 0541  WBC 11.5*  --  7.9  NEUTROABS 9.4*  --  5.9  HGB 6.6*  6.6* 7.8* 7.8*  HCT 20.5*  20.8* 23.7* 23.5*  MCV 91.6  --  88.0  PLT 178  --  192    Basic Metabolic Panel:   Recent Labs Lab 03/05/16 1555  03/07/16 0213 03/08/16 0541  NA 127*  < > 138 131*  K 3.5  < > 3.2* 3.1*  CL 92*  < > 107 103  CO2 23  < > 22 23  GLUCOSE 223*  < > 126* 134*  BUN 66*  < > 17 7  CREATININE 1.19   < > 0.66 0.57*  CALCIUM 8.5*  < > 8.0* 7.9*  MG 2.3  --   --   --   < > = values in this interval not displayed.  Lipid Panel:     Component Value Date/Time   CHOL 74 03/06/2016 0916   TRIG 51 03/06/2016 0916   HDL 27* 03/06/2016 0916   CHOLHDL 2.7 03/06/2016 0916   VLDL 10 03/06/2016 0916   LDLCALC 37 03/06/2016 0916   HgbA1c:  Lab Results  Component Value Date   HGBA1C 5.8* 06/11/2012   Urine Drug Screen:     Component Value Date/Time   LABOPIA NONE DETECTED 06/11/2012 1126   COCAINSCRNUR NONE DETECTED 06/11/2012 1126   LABBENZ NONE DETECTED 06/11/2012 1126   AMPHETMU NONE DETECTED 06/11/2012 1126   THCU NONE DETECTED 06/11/2012 1126   LABBARB NONE DETECTED 06/11/2012 1126      IMAGING  Ct Abdomen Pelvis W Wo Contrast  03/08/2016  CLINICAL DATA:  History of ETOH, pancreatitis, and pancreatic pseudocyst and pseudoaneurysm. 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 *BRTO protocol used per Dr. Loreta Ave EXAM: CT ANGIOGRAPHY ABDOMEN AND PELVIS TECHNIQUE: Multidetector CT imaging of the abdomen and pelvis was performed using the standard protocol during  bolus administration of intravenous contrast. Multiplanar reconstructed images including MIPs were obtained and reviewed to evaluate the vascular anatomy. CONTRAST:  ISOVUE-300 IOPAMIDOL (ISOVUE-300) INJECTION 61% COMPARISON:  COMPARISON 02/16/2014 FINDINGS: ARTERIAL Aorta: Mild plaque in the infrarenal segment. No aneurysm, dissection, or stenosis. Celiac axis:          Patent. Superior mesenteric: Mild ostial plaque without stenosis. Classic distal branch anatomy. Left renal: Ostial plaque extending over length of approximately 2 cm resulting in at least moderate short segment stenosis, patent distally. Right renal: Ostial plaque extending over length of at least 13 mm, resulting in short segment moderately severe stenosis, patent distally. Inferior mesenteric:  Mild ostial stenosis, patent distally. Left iliac: Scattered  plaque. No aneurysm, dissection, or stenosis. Right iliac: Minimal plaque. No aneurysm, dissection, or stenosis. VENOUS Patent hepatic veins, portal vein, SMV, and IMV. I do not identify a contiguous patent splenic vein. Early opacification of small varices in the gastric fundus, with larger collateral venous channels around the gastric body and antrum draining toward the portal vein. No significant retroperitoneal venous collateral drainage network nor any large draining into the left renal vein. Patent bilateral renal veins, IVC, and iliac venous system. Review of the MIP images confirms the above findings. NONVASCULAR Lower chest: Trace left pleural effusion. Adjacent subsegmental atelectasis/ infiltrate posteriorly in the visualized left lower lobe. Coronary calcifications. Hepatobiliary: No masses or other significant abnormality. Gallbladder incompletely distended. Pancreas: Scattered coarse calcifications within the pancreatic head, body and tail consistent with chronic pancreatitis. The enhancing lesion seen adjacent to the tail of the pancreas on the previous study suggestive of pseudoaneurysm is not seen. There is a mixed attenuation left subphrenic pseudocyst lateral to the spleen abutting the gastric fundus and extending down to the pancreatic tail, measuring 9.9 AP x 6.9 transverse x 8.6 cm craniocaudal, and is new since prior study. There is high attenuation within the anterior aspect of the collection suggesting previous hemorrhage. Near complete resolution of the small pseudocyst seen posterior to the portosplenic confluence on the prior study, and resolution of the loculated fluid anterolateral to the stomach seen on prior exam. Spleen: Within normal limits in size and appearance. Adrenals/Urinary Tract: Bilateral adrenal hypertrophy left greater than right. Kidneys unremarkable. No hydronephrosis or nephrolithiasis. Urinary bladder is physiologically distended and there is diffuse wall thickening.  Stomach/Bowel: Stomach is nondistended, distorted by the adjacent pseudocyst. Enhancing small fundal varices as discussed above. Small bowel and colon are nondilated. Normal appendix. Lymphatic: No pathologically enlarged lymph nodes. Reproductive: Moderate prostatic enlargement. Other: No free air. Musculoskeletal: Degenerative disc disease most marked L4-5 and L5-S1. IMPRESSION: 1. 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 2. 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 The pseudoaneurysm previously noted proximal to the pancreatic tail is no longer evident. 3. 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 Electronically Signed   By: Corlis Leak M.D.   On: 03/08/2016 14:45   Mr Brain Wo Contrast  03/06/2016  CLINICAL DATA:  Acute onset of left hemiplegia. Last seen normal 2 days ago. EXAM: MRI HEAD WITHOUT CONTRAST TECHNIQUE: Multiplanar, multiecho pulse sequences of the brain and surrounding structures were obtained without intravenous contrast. COMPARISON:  CT 03/05/2016.  CTA 03/06/2016. FINDINGS: Diffusion imaging and FLAIR axial sequences performed. The study suffers from motion degradation. There are scattered areas of acute infarction in the right middle cerebral artery territory. There are small areas of acute infarction in the right basal ganglia and along  the insular cortex. There is a 1-2 cm region of acute infarction in the deep right frontal white matter. There is cortical and subcortical infarction in the right posterior frontal and frontoparietal junction region. There is mild swelling but no mass effect or shift. No hemorrhage is detected on these limited sequences. Elsewhere, the brain shows generalized atrophy. Chronic small-vessel changes of the deep white matter. Ventricular prominence secondary to generalized atrophy. IMPRESSION: Acute infarctions scattered in the right middle cerebral artery  territory as outlined above. Most extensive involvement is at the posterior frontal and frontoparietal junction cortical and subcortical brain. No mass effect or shift. Electronically Signed   By: Paulina FusiMark  Shogry M.D.   On: 03/06/2016 15:59   [place] Abdomen Complete  03/07/2016  CLINICAL DATA:  Alcoholic with fatty liver, pancreatitis, ascites by ultrasound and CT in 2011. Assess for progression to cirrhosis and recurrent ascites. EXAM: ABDOMEN ULTRASOUND COMPLETE COMPARISON:  02/16/2014 FINDINGS: Gallbladder: Gallbladder has a normal appearance. Gallbladder wall is 1.5 mm, within normal limits. No stones or pericholecystic fluid. No sonographic Murphy's sign. Common bile duct: Diameter: 5.8 mm Liver: The liver is echogenic. There is attenuation of the ultrasound wave, poor visualization of the internal hepatic architecture, and loss of definition of the diaphragm. No focal liver lesions are identified. IVC: No abnormality visualized. Pancreas: Not well seen because of bowel gas. Spleen: Not well seen because of bowel gas. Right Kidney: Length: 13.6 cm. Echogenicity within normal limits. No mass or hydronephrosis visualized. Left Kidney: Length: 13.3 cm . Echogenicity within normal limits. No mass or hydronephrosis visualized. Abdominal aorta: Proximal aspect not well seen. Visualized portion is not aneurysmal, 2.4 cm. Other findings: No ascites identified. Study quality is degraded by overlying bowel gas, and level of patient cooperation. IMPRESSION: 1. Hepatic steatosis.  No focal liver lesion. 2. No ascites. Electronically Signed   By: Norva PavlovElizabeth  Brown M.D.   On: 03/07/2016 11:08       PHYSICAL EXAM Middle aged Caucasian male not in distress. . Afebrile. Head is nontraumatic. Neck is supple without bruit.    Cardiac exam no murmur or gallop. Lungs are clear to auscultation. Distal pulses are well felt. Neurological Exam :  Awake alert oriented to place and person. Diminished attention, registration and  recall. Speech is clear without dysarthria. No aphasia. Right gaze preference but able to look to the left past midline. No blink to threat on the right but not on the left suspect left common and was hemianopsia. Mild left lower facial weakness. Tongue midline. Motor system exam reveals left hemiparesis with grade 2/5 in the left hand and 3/5 in the left lower extremity. Normal strength on the right. Decreased left hemibody touch and pinprick sensation. Reflexes brisk on the left compared to the right. Left plantar upgoing right downgoing. ASSESSMENT/PLAN Mr. Marcelle SmilingKeith W Coor is a 50 y.o. male with history of ischemic heart disease and CVA presenting with L sided weakness. He did not receive IV t-PA due to delay in arrival.    New R brain Stroke due to failure of collaterals in setting of GIB/anemia No tongue bite  Resultant  L hemiparesis, field cut senosry loss MRI   acute infarction in the right middle cerebral artery territory. There are small areas of acute infarction in the right basal ganglia and along the insular cortex. 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 Left ICA stent is patent 2D Echo Systolic function was normal. The estimated ejection fraction was in the range of 55% to 60%. Wall motion was normal; there were no regional wall motion  abnormalities.  EEG normal   LDL 37 mg%  HgbA1c 5.8  SCDs for VTE prophylaxis Diet NPO time specified  aspirin 81 mg daily and clopidogrel 75 mg daily prior to admission though patient admits he was not taking as routinely as he should, now on No antithrombotic due to anemia.   Ongoing aggressive stroke risk factor management  Therapy recommendations:  pending   Disposition:  pending   Hypertension  Stable  Permissive hypertension (OK if < 220/120) but gradually normalize in 5-7 days  Long-term BP goal  normotensive  Hyperlipidemia  Home meds:  zocor 20  LDL 37 goal < 70  zocor on hold due to elevated CK  Resume statin when able  Continue statin at discharge  Other Stroke Risk Factors  Cigarette smoker, advised to stop smoking  ETOH abuse, advised to drink no more than 2 drinks a day  Hx stroke/TIA  05/2012 R parietal and insular infarct, embolic d/t R ICA occlusion, incidental L ICA dissection  L ICA stent by Dr. Festus Barren in [facility] 04/06/15  Family hx stroke (mother)  Coronary artery disease  Other Active Problems  Anemia. HGB 4.8, transfused w/ 3 units, possible GIB  AKI  Hypokalemia 2.9, replaced  Hospital day # 3  SETHI,PRAMOD  [facility] See Amion for Pager information 03/08/2016 3:05 PM  I have personally examined this patient, reviewed notes, independently viewed imaging studies, participated in medical decision making and plan of care. I have made any additions or clarifications directly to the above note. Agree with note above.  He presented with an Onset Left Hemiparesis Due To Right hemispheric Infarct Due To Failure of Collaterals Circulation in a Patient with Known Chronic Rt ICA Occlusion.   He Also Had What Appears to Be an Acute GI Bleed with Low Hematocrit Requiring Transfusion.  Plan patient is neurologically stable.Recommend aspirin when safe per gastroenterolgy. Mobilze out of bed as tolerated and transfer to rehab next week if stable. Stroke team will sign off. Call for questions. D/w patient and sister and answered questions.Greater than 50% time during this 25 minute visit was spent on counseling and coordination of care about stroke risk and prevention  Delia Heady, MD Medical Director [facility] Pager: [phone] 03/08/2016 3:05 PM    To contact Stroke Continuity provider, please refer to [email or web address]. After hours, contact General Neurology

## 2016-03-08 NOTE — Progress Notes (Signed)
OT Cancellation Note  Patient Details Name: Marcelle SmilingKeith W Tollison MRN: [id number] DOB: 14-Dec-1965   Cancelled Treatment:    Reason Eval/Treat Not Completed: Patient not medically ready (active bedrest orders).   Gaye AlkenBailey A Zeenat Jeanbaptiste M.S., OTR/L Pager: [phone]  03/08/2016, 4:38 PM

## 2016-03-08 NOTE — Interval H&P Note (Signed)
History and Physical Interval Note:  03/08/2016 8:09 AM  Marcelle SmilingKeith W Jentsch  has presented today for surgery, with the diagnosis of anemia, gi bleed  The various methods of treatment have been discussed with the patient and family. After consideration of risks, benefits and other options for treatment, the patient has consented to  Procedure(s): ESOPHAGOGASTRODUODENOSCOPY (EGD) (N/A) as a surgical intervention .  The patient's history has been reviewed, patient examined, no change in status, stable for surgery.  I have reviewed the patient's chart and labs.  Questions were answered to the patient's satisfaction.     Reeves ForthSteven Paul Armbruster

## 2016-03-08 NOTE — Progress Notes (Addendum)
GI UPDATE: I reviewed the CT BRTO protocol with Dr. [name], which was ordered after the patient had an EGD done showing large gastric varix.   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 He also has a large pancreatic pseudocyst which could be hemorrhagic. The previously noted pseudoaneurysm has thrombosed. The liver does not appear cirrhotic.   He either bled from gastric varices or the pseudocyst but has intervally stopped bleeding. I have stopped his octreotide as he does not appear to have portal hypertension / cirrhosis, and gastric varices are due to the splenic vein thrombosis.   Moving forward, if the patient acutely re-bleeds, recommend tagged RBC scan to help differentiate where he is bleeding from. Pending this result, IR could consider splenic embolization to acutely stop the variceal bleeding, and potentially treat pseudocyst bleeding. In the absence of acute bleeding, to prevent the long term risk of bleeding of gastric varices, splenectomy is the treatment of choice. However, the patient is a poor surgical candidate right now.   I would continue PPI for now and we will reassess him tomorrow. If he has recurrent bleeding please call us immediately to coordinate tagged RBC scan and he may need IR intervention as outlined above. We can consider surgical evaluation tomorrow to discuss the possibility of splenectomy at some point in time pending his course.   I will update Dr. [name] on his case as she is covering this weekend.   I spoke with the patient about these results this evening. Family was not present for the conversation. Given he has not had any recurrence of active bleeding, okay to start clear liquid diet tonight.   [name], MD [facility] Pager [phone]

## 2016-03-08 NOTE — Anesthesia Postprocedure Evaluation (Signed)
Anesthesia Post Note  Patient: Brandon [name]  Procedure(s) Performed: Procedure(s) (LRB): ESOPHAGOGASTRODUODENOSCOPY (EGD) (N/A)  Patient location during evaluation: PACU Anesthesia Type: MAC Level of consciousness: awake and alert Pain management: pain level controlled Vital Signs Assessment: post-procedure vital signs reviewed and stable Respiratory status: spontaneous breathing, nonlabored ventilation, respiratory function stable and patient connected to nasal cannula oxygen Cardiovascular status: stable and blood pressure returned to baseline Anesthetic complications: no    Last Vitals:  Filed Vitals:   03/08/16 1005 03/08/16 1010  BP:  134/62  Pulse: 71   Temp:    Resp: 12 14    Last Pain:  Filed Vitals:   03/08/16 1011  PainSc: 0-No pain                 Reino KentJudd, Sheresa Cullop J

## 2016-03-08 NOTE — Progress Notes (Signed)
Patient having some tremors in upper extremities, at times seems to be hallucinating. Questioned about alcohol use. Patient admits to consuming 0.5-1.5 gallons of vodka per week. MD paged. Will cont to monitor.

## 2016-03-08 NOTE — Anesthesia Preprocedure Evaluation (Signed)
Anesthesia Evaluation  Patient identified by MRN, date of birth, ID band Patient awake    Reviewed: Allergy & Precautions, H&P , NPO status , Patient's Chart, lab work & pertinent test results, reviewed documented beta blocker date and time   Airway Mallampati: II  TM Distance: >3 FB Neck ROM: full    Dental no notable dental hx. (+) Dental Advisory Given, Teeth Intact   Pulmonary Current Smoker,    Pulmonary exam normal breath sounds clear to auscultation       Cardiovascular Exercise Tolerance: Good hypertension, Pt. on medications and Pt. on home beta blockers + CAD  Normal cardiovascular exam Rhythm:regular Rate:Normal     Neuro/Psych CVA negative psych ROS   GI/Hepatic negative GI ROS, (+)     substance abuse  alcohol use,   Endo/Other  negative endocrine ROS  Renal/GU negative Renal ROS  negative genitourinary   Musculoskeletal   Abdominal   Peds  Hematology negative hematology ROS (+)   Anesthesia Other Findings   Reproductive/Obstetrics negative OB ROS                             Anesthesia Physical Anesthesia Plan  ASA: III  Anesthesia Plan: MAC   Post-op Pain Management:    Induction:   Airway Management Planned:   Additional Equipment:   Intra-op Plan:   Post-operative Plan:   Informed Consent: I have reviewed the patients History and Physical, chart, labs and discussed the procedure including the risks, benefits and alternatives for the proposed anesthesia with the patient or authorized representative who has indicated his/her understanding and acceptance.   Dental Advisory Given  Plan Discussed with: CRNA  Anesthesia Plan Comments:         Anesthesia Quick Evaluation

## 2016-03-08 NOTE — Progress Notes (Signed)
Speech Language Pathology Treatment: Cognitive-Linquistic  Patient Details Name: Marcelle SmilingKeith W Ciolek MRN: [id number] DOB: 08-Jan-1966 Today's Date: 03/08/2016 Time: [id number] SLP Time Calculation (min) (ACUTE ONLY): 27 min  Assessment / Plan / Recommendation Clinical Impression  Pt currently NPO per GI, therefore PO trials were held and tx focused on cognitive goals. Pt was oriented to person and time, but required Min cues for increased orientation to situation and Mod-Max cues for basic verbal problem solving to determine his location. Pt quickly forgets where he is, and needs reminders to recall that he is at the hospital, not at his sister's house. During word recall task, he needs Mod cues for immediate recall but only Min cues for delayed recall after 5 min or less. Intellectual awareness is relatively intact, but he has limited anticipatory and safety awareness. Continue to recommend PT/OT consult.   HPI HPI: 50 y.o. male with history of ischemic heart disease and CVA presenting with L sided weakness, found to have right MCA infarctions and upper GI bleed.       SLP Plan  Continue with current plan of care     Recommendations                Oral Care Recommendations: Oral care BID Follow up Recommendations: Inpatient Rehab;24 hour supervision/assistance Plan: Continue with current plan of care     GO               Maxcine HamLaura Paiewonsky, M.A. CCC-SLP [phone]  Maxcine Hamaiewonsky, Jonatha Gagen 03/08/2016, 4:30 PM

## 2016-03-09 DIAGNOSIS — K863 Pseudocyst of pancreas: Secondary | ICD-10-CM | POA: Diagnosis present

## 2016-03-09 DIAGNOSIS — E871 Hypo-osmolality and hyponatremia: Secondary | ICD-10-CM | POA: Diagnosis present

## 2016-03-09 DIAGNOSIS — I82890 Acute embolism and thrombosis of other specified veins: Secondary | ICD-10-CM | POA: Diagnosis present

## 2016-03-09 DIAGNOSIS — D62 Acute posthemorrhagic anemia: Secondary | ICD-10-CM | POA: Diagnosis present

## 2016-03-09 LAB — URINALYSIS, ROUTINE W REFLEX MICROSCOPIC
Bilirubin Urine: NEGATIVE
GLUCOSE, UA: 500 mg/dL — AB
Ketones, ur: NEGATIVE mg/dL
Nitrite: NEGATIVE
PROTEIN: NEGATIVE mg/dL
SPECIFIC GRAVITY, URINE: 1.009 (ref 1.005–1.030)
pH: 7 (ref 5.0–8.0)

## 2016-03-09 LAB — CBC WITH DIFFERENTIAL/PLATELET
Basophils Absolute: 0 10*3/uL (ref 0.0–0.1)
Basophils Relative: 0 %
Eosinophils Absolute: 0.1 10*3/uL (ref 0.0–0.7)
Eosinophils Relative: 1 %
HEMATOCRIT: 26 % — AB (ref 39.0–52.0)
HEMOGLOBIN: 8.7 g/dL — AB (ref 13.0–17.0)
LYMPHS ABS: 0.9 10*3/uL (ref 0.7–4.0)
LYMPHS PCT: 10 %
MCH: 29.2 pg (ref 26.0–34.0)
MCHC: 33.5 g/dL (ref 30.0–36.0)
MCV: 87.2 fL (ref 78.0–100.0)
MONOS PCT: 13 %
Monocytes Absolute: 1.1 10*3/uL — ABNORMAL HIGH (ref 0.1–1.0)
NEUTROS ABS: 6.3 10*3/uL (ref 1.7–7.7)
NEUTROS PCT: 76 %
Platelets: 242 10*3/uL (ref 150–400)
RBC: 2.98 MIL/uL — AB (ref 4.22–5.81)
RDW: 17.3 % — ABNORMAL HIGH (ref 11.5–15.5)
WBC: 8.4 10*3/uL (ref 4.0–10.5)

## 2016-03-09 LAB — BASIC METABOLIC PANEL
ANION GAP: 7 (ref 5–15)
Anion gap: 8 (ref 5–15)
BUN: 7 mg/dL (ref 6–20)
CALCIUM: 8.4 mg/dL — AB (ref 8.9–10.3)
CHLORIDE: 95 mmol/L — AB (ref 101–111)
CO2: 22 mmol/L (ref 22–32)
CO2: 21 mmol/L — ABNORMAL LOW (ref 22–32)
Calcium: 8.4 mg/dL — ABNORMAL LOW (ref 8.9–10.3)
Chloride: 98 mmol/L — ABNORMAL LOW (ref 101–111)
Creatinine, Ser: 0.5 mg/dL — ABNORMAL LOW (ref 0.61–1.24)
Creatinine, Ser: 0.72 mg/dL (ref 0.61–1.24)
GFR calc Af Amer: >60 mL/min (ref >60)
GFR calc Af Amer: >60 mL/min (ref >60)
GFR calc non Af Amer: >60 mL/min (ref >60)
GLUCOSE: 167 mg/dL — AB (ref 65–99)
GLUCOSE: 292 mg/dL — AB (ref 65–99)
POTASSIUM: 3.5 mmol/L (ref 3.5–5.1)
Potassium: 4 mmol/L (ref 3.5–5.1)
SODIUM: 125 mmol/L — AB (ref 135–145)
SODIUM: 126 mmol/L — AB (ref 135–145)

## 2016-03-09 LAB — URINE MICROSCOPIC-ADD ON

## 2016-03-09 LAB — HEMOGLOBIN AND HEMATOCRIT, BLOOD
HEMATOCRIT: 27.6 % — AB (ref 39.0–52.0)
HEMOGLOBIN: 9.1 g/dL — AB (ref 13.0–17.0)

## 2016-03-09 LAB — CREATININE, URINE, RANDOM: Creatinine, Urine: 18.1 mg/dL

## 2016-03-09 LAB — MAGNESIUM: MAGNESIUM: 1.8 mg/dL (ref 1.7–2.4)

## 2016-03-09 LAB — OSMOLALITY: OSMOLALITY: 264 mosm/kg — AB (ref 275–295)

## 2016-03-09 LAB — OSMOLALITY, URINE: OSMOLALITY UR: 181 mosm/kg — AB (ref 300–900)

## 2016-03-09 LAB — SODIUM, URINE, RANDOM: SODIUM UR: 49 mmol/L

## 2016-03-09 MED ORDER — TIOTROPIUM BROMIDE MONOHYDRATE 18 MCG IN CAPS
18.0000 ug | ORAL_CAPSULE | Freq: Every day | RESPIRATORY_TRACT | Status: DC
  Administered 2016-03-09 – 2016-03-11 (×3): 18 ug via RESPIRATORY_TRACT
  Filled 2016-03-09 (×2): qty 5

## 2016-03-09 MED ORDER — PANCRELIPASE (LIP-PROT-AMYL) 12000-38000 UNITS PO CPEP
2.0000 | ORAL_CAPSULE | Freq: Three times a day (TID) | ORAL | Status: DC
  Administered 2016-03-10 – 2016-03-12 (×9): 24000 [IU] via ORAL
  Filled 2016-03-09 (×10): qty 2

## 2016-03-09 MED ORDER — DEXTROSE 5 % IV SOLN
1.0000 g | INTRAVENOUS | Status: DC
  Administered 2016-03-09 – 2016-03-11 (×3): 1 g via INTRAVENOUS
  Filled 2016-03-09 (×5): qty 10

## 2016-03-09 MED ORDER — POTASSIUM CHLORIDE CRYS ER 20 MEQ PO TBCR
40.0000 meq | EXTENDED_RELEASE_TABLET | Freq: Once | ORAL | Status: AC
  Administered 2016-03-09: 40 meq via ORAL
  Filled 2016-03-09: qty 2

## 2016-03-09 MED ORDER — BUSPIRONE HCL 10 MG PO TABS
10.0000 mg | ORAL_TABLET | Freq: Two times a day (BID) | ORAL | Status: DC
  Administered 2016-03-09: 10 mg via ORAL
  Filled 2016-03-09: qty 1

## 2016-03-09 MED ORDER — BUSPIRONE HCL 10 MG PO TABS
5.0000 mg | ORAL_TABLET | Freq: Two times a day (BID) | ORAL | Status: DC
  Administered 2016-03-09 – 2016-03-12 (×6): 5 mg via ORAL
  Filled 2016-03-09 (×6): qty 1

## 2016-03-09 MED ORDER — ALBUTEROL SULFATE (2.5 MG/3ML) 0.083% IN NEBU: 3.0000 mL | INHALATION_SOLUTION | Freq: Four times a day (QID) | RESPIRATORY_TRACT | Status: DC | PRN

## 2016-03-09 MED ORDER — FUROSEMIDE 10 MG/ML IJ SOLN
20.0000 mg | Freq: Once | INTRAMUSCULAR | Status: AC
  Administered 2016-03-09: 20 mg via INTRAVENOUS
  Filled 2016-03-09: qty 2

## 2016-03-09 MED ORDER — FUROSEMIDE 10 MG/ML IJ SOLN: 40.0000 mg | Freq: Once | INTRAMUSCULAR | Status: DC

## 2016-03-09 NOTE — Progress Notes (Addendum)
[facility]OSS [facility]OVER [facility] Subjective: Patient seen and examined. There's been no evidence of any GI bleeding since yesterday he had 1 brown colored BM today he denies having any abdominal pain, nausea or vomiting. Plans outlined in case he has a repeat GI bleed-nuclear medicine tag scan will be done as outlined by Dr. Remi Haggard. Brandon [name] is a 50 year old white male with a history of coronary artery disease he's had his RCA stented on 03/16/2015 and has been dual antiplatelet therapy. 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   Objective: Vital signs in last 24 hours: Temp:  [97.5 F (36.4 C)-99 F (37.2 C)] 97.8 F (36.6 C) (06/17 0827) Pulse Rate:  [66-85] 85 (06/17 0827) Resp:  [12-20] 20 (06/17 0827) BP: (134-175)/(62-103) 143/103 mmHg (06/17 0827) SpO2:  [97 %-100 %] 100 % (06/17 0923) Weight:  [88.1 kg (194 lb 3.6 oz)] 88.1 kg (194 lb 3.6 oz) (06/17 0300) Last BM Date: 03/05/16  Intake/Output from previous day: 06/16 0701 - 06/17 0700 In: 4541.7 [P.O.:960; I.V.:3181.7; IV Piggyback:400] Out: 6525 [Urine:6525] Intake/Output this shift: Total I/O In: 100 [P.O.:100] Out: 700 [Urine:700]  General appearance: cooperative, distracted, fatigued, no distress and pale Resp: clear to auscultation bilaterally Cardio: regular rate and rhythm, S1, S2 normal, no murmur, click, rub or gallop GI: soft, non-tender; bowel sounds normal; no masses,  no organomegaly  Lab Results:  Recent Labs  03/07/16 0213 03/07/16 1030 03/08/16 0541 03/09/16 0541  WBC 11.5*  --  7.9 8.4  HGB 6.6*  6.6* 7.8* 7.8* 8.7*  HCT 20.5*  20.8* 23.7* 23.5* 26.0*  PLT 178  --  192 242   BMET  Recent Labs  03/07/16 0213 03/08/16 0541 03/09/16 0541  NA 138 131* 125*  K 3.2* 3.1* 3.5  CL 107 103 95*  CO2 GLUCOSE 126* 134* 167*  BUN 17 7 <5*  CREATININE 0.66 0.57* 0.50*  CALCIUM 8.0* 7.9* 8.4*   LFT  Recent Labs  03/07/16 1433 03/08/16 0541  PROT 5.2* 5.1*  ALBUMIN 2.5* 2.5*  AST 804* 423*  ALT 780* 601*  ALKPHOS 70 74  BILITOT 1.4* 1.4*  BILIDIR 0.4  --   IBILI 1.0*  --    PT/INR  Recent Labs  03/07/16 1433  LABPROT 16.3*  INR 1.29   Hepatitis Panel  Recent Labs  03/06/16 1857  HEPBSAG Negative  HCVAB <0.1  HEPAIGM Negative  HEPBIGM Negative   Studies/Results: Ct Abdomen Pelvis W Wo Contrast  03/08/2016  CLINICAL DATA:  History of ETOH, pancreatitis, and pancreatic pseudocyst and pseudoaneurysm. 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 *BRTO protocol used per Dr. Loreta Ave EXAM: CT ANGIOGRAPHY ABDOMEN AND PELVIS TECHNIQUE: Multidetector CT imaging of the abdomen and pelvis was performed using the standard protocol during bolus administration of intravenous contrast. Multiplanar reconstructed images including MIPs were obtained and reviewed to evaluate the vascular anatomy. CONTRAST:  ISOVUE-300 IOPAMIDOL (ISOVUE-300) INJECTION 61% COMPARISON:  COMPARISON 02/16/2014 FINDINGS: ARTERIAL Aorta: Mild plaque in the infrarenal segment. No aneurysm, dissection, or stenosis. Celiac axis:          Patent. Superior mesenteric: Mild ostial plaque without stenosis. Classic distal branch anatomy. Left renal: Ostial plaque extending over length of approximately 2 cm resulting in at least moderate short segment stenosis, patent distally. Right renal: Ostial plaque extending over length of at least 13 mm, resulting in short segment moderately severe stenosis, patent distally. Inferior mesenteric:  Mild ostial stenosis, patent distally.  Left iliac: Scattered plaque. No aneurysm, dissection, or stenosis. Right iliac: Minimal plaque. No aneurysm, dissection, or stenosis. VENOUS Patent hepatic veins, portal vein, SMV, and IMV. I do not identify a contiguous patent splenic vein. Early opacification of small varices in the gastric fundus, with larger collateral venous channels around the gastric body  and antrum draining toward the portal vein. No significant retroperitoneal venous collateral drainage network nor any large draining into the left renal vein. Patent bilateral renal veins, IVC, and iliac venous system. Review of the MIP images confirms the above findings. NONVASCULAR Lower chest: Trace left pleural effusion. Adjacent subsegmental atelectasis/ infiltrate posteriorly in the visualized left lower lobe. Coronary calcifications. Hepatobiliary: No masses or other significant abnormality. Gallbladder incompletely distended. Pancreas: Scattered coarse calcifications within the pancreatic head, body and tail consistent with chronic pancreatitis. The enhancing lesion seen adjacent to the tail of the pancreas on the previous study suggestive of pseudoaneurysm is not seen. There is a mixed attenuation left subphrenic pseudocyst lateral to the spleen abutting the gastric fundus and extending down to the pancreatic tail, measuring 9.9 AP x 6.9 transverse x 8.6 cm craniocaudal, and is new since prior study. There is high attenuation within the anterior aspect of the collection suggesting previous hemorrhage. Near complete resolution of the small pseudocyst seen posterior to the portosplenic confluence on the prior study, and resolution of the loculated fluid anterolateral to the stomach seen on prior exam. Spleen: Within normal limits in size and appearance. Adrenals/Urinary Tract: Bilateral adrenal hypertrophy left greater than right. Kidneys unremarkable. No hydronephrosis or nephrolithiasis. Urinary bladder is physiologically distended and there is diffuse wall thickening. Stomach/Bowel: Stomach is nondistended, distorted by the adjacent pseudocyst. Enhancing small fundal varices as discussed above. Small bowel and colon are nondilated. Normal appendix. Lymphatic: No pathologically enlarged lymph nodes. Reproductive: Moderate prostatic enlargement. Other: No free air. Musculoskeletal: Degenerative disc disease  most marked L4-5 and L5-S1. IMPRESSION: 1. 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 2. 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 The pseudoaneurysm previously noted proximal to the pancreatic tail is no longer evident. 3. 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 Electronically Signed   By: Corlis Leak M.D.   On: 03/08/2016 14:45   US Abdomen Complete  03/07/2016  CLINICAL DATA:  Alcoholic with fatty liver, pancreatitis, ascites by ultrasound and CT in 2011. Assess for progression to cirrhosis and recurrent ascites. EXAM: ABDOMEN ULTRASOUND COMPLETE COMPARISON:  02/16/2014 FINDINGS: Gallbladder: Gallbladder has a normal appearance. Gallbladder wall is 1.5 mm, within normal limits. No stones or pericholecystic fluid. No sonographic Murphy's sign. Common bile duct: Diameter: 5.8 mm Liver: The liver is echogenic. There is attenuation of the ultrasound wave, poor visualization of the internal hepatic architecture, and loss of definition of the diaphragm. No focal liver lesions are identified. IVC: No abnormality visualized. Pancreas: Not well seen because of bowel gas. Spleen: Not well seen because of bowel gas. Right Kidney: Length: 13.6 cm. Echogenicity within normal limits. No mass or hydronephrosis visualized. Left Kidney: Length: 13.3 cm . Echogenicity within normal limits. No mass or hydronephrosis visualized. Abdominal aorta: Proximal aspect not well seen. Visualized portion is not aneurysmal, 2.4 cm. Other findings: No ascites identified. Study quality is degraded by overlying bowel gas, and level of patient cooperation. IMPRESSION: 1. Hepatic steatosis.  No focal liver lesion. 2. No ascites. Electronically Signed   By: Norva Pavlov M.D.   On: 03/07/2016 11:08   Medications: I  have reviewed the patient's current medications.  Assessment/Plan: GI bleed-gastric varices with SVT thrombosis-chronic  pancreatitis and pancreatic psuedocyst- Currently stable. Will proceed with a tagged nuclear medicine scan if he rebleeds.  LOS: 4 days   Daelynn Blower 03/09/2016, 10:01 AM

## 2016-03-09 NOTE — Evaluation (Signed)
Physical Therapy Evaluation Patient Details Name: Brandon [name] MRN: [id number] DOB: [birth year] Today's Date: 03/09/2016   History of Present Illness  Pt adm with lt sided weakness and UGI bleed. 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 PMH - ETOH, CAD, pancreatitis  Clinical Impression  Pt admitted with above diagnosis and presents to PT with functional limitations due to deficits listed below (See PT problem list). Pt needs skilled PT to maximize independence and safety to allow discharge to CIR. Pt with significant decline in mobility due to effects of CVA. Feel like he can benefit from CIR.     Follow Up Recommendations CIR    Equipment Recommendations  Other (comment) (To be assessed)    Recommendations for Other Services Rehab consult     Precautions / Restrictions Precautions Precautions: Fall Restrictions Weight Bearing Restrictions: No      Mobility  Bed Mobility Overal bed mobility: Needs Assistance Bed Mobility: Supine to Sit     Supine to sit: +2 for physical assistance;Mod assist     General bed mobility comments: Assist to bring LLE off bed and to elevate trunk into sitting  Transfers Overall transfer level: Needs assistance Equipment used: 2 person hand held assist;Ambulation equipment used Transfers: Sit to/from [employer]Stand;Squat Pivot Transfers Sit to Stand: +2 physical assistance;Mod assist   Squat pivot transfers: +2 physical assistance;Mod assist     General transfer comment: Performed bed to chair with bilat forearm support. Pt unable to fully stand this way and squat pivoted to chair. From chair stood pt with [facility] and able to achieve full standing with lt knee blocked by shin pad.  Ambulation/Gait                Stairs            Wheelchair Mobility    Modified Rankin (Stroke Patients Only) Modified Rankin (Stroke Patients Only) Pre-Morbid Rankin Score: No significant disability Modified Rankin:  Severe disability     Balance Overall balance assessment: Needs assistance Sitting-balance support: Single extremity supported;Feet supported Sitting balance-Leahy Scale: Poor Sitting balance - Comments: RUE support and min A for static sitting. Progressed to min guard Postural control: Left lateral lean Standing balance support: Bilateral upper extremity supported Standing balance-Leahy Scale: Poor Standing balance comment: Stood with Stedy and mod A for static standing. Lt knee blocked by shin pad and tactile/verbal cues to extend lt hip.                             Pertinent Vitals/Pain Pain Assessment: No/denies pain    Home Living Family/patient expects to be discharged to:: Private residence Living Arrangements: Other relatives (sister) Available Help at Discharge: Family;Available PRN/intermittently Type of Home: House Home Access: Stairs to enter Entrance Stairs-Rails: Doctor, general practiceight;Left Entrance Stairs-Number of Steps: 5 Home Layout: Two level Home Equipment: None      Prior Function Level of Independence: Independent               Hand Dominance        Extremity/Trunk Assessment   Upper Extremity Assessment: Defer to OT evaluation           Lower Extremity Assessment: LLE deficits/detail   LLE Deficits / Details: ankle 1/5, knee 2-/5, hip 2-/5     Communication   Communication: No difficulties  Cognition Arousal/Alertness: Awake/alert Behavior During Therapy: Impulsive Overall Cognitive Status: Impaired/Different from baseline Area of Impairment: Orientation;Attention;Memory;Safety/judgement;Problem solving Orientation Level: Disoriented  to;Situation Current Attention Level: Sustained Memory: Decreased recall of precautions;Decreased short-term memory   Safety/Judgement: Decreased awareness of safety;Decreased awareness of deficits   Problem Solving: Slow processing;Requires verbal cues;Requires tactile cues General Comments: Lt  inattention/neglect    General Comments      Exercises        Assessment/Plan    PT Assessment Patient needs continued PT services  PT Diagnosis Difficulty walking;Hemiplegia non-dominant side   PT Problem List Decreased strength;Decreased balance;Decreased mobility;Decreased cognition;Decreased knowledge of use of DME;Decreased safety awareness;Decreased knowledge of precautions  PT Treatment Interventions DME instruction;Gait training;Functional mobility training;Therapeutic activities;Therapeutic exercise;Balance training;Neuromuscular re-education;Cognitive remediation;Patient/family education   PT Goals (Current goals can be found in the Care Plan section) Acute Rehab PT Goals Patient Stated Goal: walk PT Goal Formulation: With patient Time For Goal Achievement: 03/23/16 Potential to Achieve Goals: Fair    Frequency Min 4X/week   Barriers to discharge Decreased caregiver support Lives with sister but she works    Merchandiser, retail During Treatment: Gait belt Activity Tolerance: Patient tolerated treatment well Patient left: in chair;with call bell/phone within Radiographer, therapeutic Communication: Mobility status;Need for lift equipment         Time: 1139-1156 PT Time Calculation (min) (ACUTE ONLY): 17 min   Charges:   PT Evaluation $PT Eval Moderate Complexity: 1 Procedure     PT G Codes:        Jacee Enerson March 31, 2016, 3:30 PM [facility] PT [phone]

## 2016-03-09 NOTE — Progress Notes (Signed)
Inpatient Rehabilitation  Per PT/OT request, patient was screened by Deklan Minar for appropriateness for an Inpatient Acute Rehab consult.  At this time we are recommending an Inpatient Rehab consult.  Please order if you are agreeable.    Riad Wagley, M.A., CCC/SLP Admission Coordinator  [facility]  Cell [phone]  

## 2016-03-09 NOTE — Progress Notes (Signed)
PROGRESS NOTE    Brandon [name]  ZOX:[id number] DOB: [birth year] DOA: 03/05/2016 PCP: Laurier Nancy, MD    Brief Narrative:  50 year old gentleman with history of coronary artery disease status post stent to the RCA 03/16/2015 on dual antiplatelet therapy, history of chronic right occluded ICA and status post stent to the left ICA, history of chronic alcohol abuse, chronic pancreatitis presented with acute onset left hemiplegia and left-sided weakness in addition to a GI bleed. Neurological workup consistent with a right MCA stroke. Patient noted to be significantly anemic on admission with a hemoglobin of 4.8. Status post 4 units packed red blood cells. Patient underwent upper endoscopy which showed gastric varices. CT angiogram abdomen and pelvis assistant with pancreatic pseudocyst and splenic vein thrombosis. Patient on IV PPI. GI following. If continued bleeding will likely need tagged red blood cell study with IR intervention. Ultimately needs splenectomy by general surgery. GI, IR, neurology consulted.   Assessment & Plan:   Principal Problem:   UGIB (upper gastrointestinal bleed) Active Problems:   CVA (cerebral infarction)   Hypertension   GERD (gastroesophageal reflux disease)   Chronic Pancreatitis.   Ischemic Stroke   Alcohol Dependence   CAD in native artery   Pressure ulcer   Acute encephalopathy   Left-sided neglect   Severe anemia   Gastric varices   Alcohol abuse   Hyponatremia   Splenic vein thrombosis   Pancreatic pseudocyst   Acute blood loss anemia  #1 upper GI bleed/large gastric varices/splenic vein thrombosis Questionable etiology. Likely secondary to a gastric variceal bleed versus splenic vein thrombosis. Patient on admission had a hemoglobin of 4.8. Status post 4 units packed red blood cells hemoglobin currently at 8.7. Patient with no further bleeding at this time. Patient has been seen in consultation by gastroenterology and underwent upper endoscopy  which showed large gastric varices without bleeding. CT angiogram abdomen and pelvis with BRTO protocol with splenic vein thrombosis and large pancreatic pseudocyst which could likely be hemorrhagic. Patient is being followed by GI and if patient is noted to rebleed will need a tagged red blood cell scan to help differentiate with bleeding this from an IR consultation for consideration of splenic embolization and potentially treat pseudocyst bleeding. Patient likely will ultimately require splenectomy however a poor surgical candidate at this time. Continue IV PPI. Currently tolerating clears and will defer advancement of diet to GI. Serial CBCs. Continue to hold aspirin and Plavix. Defer to GI when aspirin may be resumed for secondary stroke prevention and coronary artery disease. Per GI.  #2 acute blood loss anemia Secondary to problem #1. Status post 4 units packed red blood cells. Hemoglobin was 4.8 on admission. Currently 8.7. Follow H&H.  #3 acute right brain stroke Patient with left hemiparesis which is slowly improving. Tpa not administered secondary to delay in arrival as well as acute bleed. MRI with acute infarct in the right MCA territory. 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 Left ICA stent patent. 2-D echo with a normal EF with no wall portion abnormalities. EEG was normal. LDL of 37. Hemoglobin A1c 5.8. Patient was on aspirin and Plavix prior to admission although not routinely taken it. Currently not on any anti-thrombotic agents secondary to acute GI bleed. PT/OT. Mobilize. Will defer to GI when patient may be placed on aspirin for secondary stroke prevention. May need CIR.  #4 hyponatremia Likely secondary to hypervolemic hyponatremia. Patient was transfused 4 units packed red blood cells during  this hospitalization as well as hydrated with IV fluids due to acute GI bleed. Check a urine sodium. Check a urine osmolality. Check a serum osmolality. 7  lock IV fluids. Lasix 20 mg IV 1. Repeat CBC this afternoon. Follow.  #5 hyperlipidemia Fasting lipid panel with LDL of 37. Goal is less than 70. Statin on hold due to elevated CK. Resume statin when able to on discharge.  #6 hypertension Permissive hypertension.  #7 tobacco abuse  tobacco cessation. Nicotine patch.  #8 history of alcohol abuse/alcohol dependence Patient was on BuSpar prior to admission will resume BuSpar half home dose of 5 mg twice daily. Ativan withdrawal protocol.  #9 transaminitis Questionable etiology. Likely secondary to alcohol abuse vs secondary to shock liver. Acute hepatitis panel negative. LFTs trending down. Follow.  #10 acute encephalopathy Likely secondary to acute GI bleed and CVA. Improving. Follow.  #11 chronic pancreatitis with pancreatic pseudocyst Likely secondary to chronic alcohol use. Resume Pancrease.      DVT prophylaxis: SCDs Code Status: Full Family Communication: Updated patient. No family at bedside. Disposition Plan: Remain in the step down unit. Pending PT evaluation may need CIR.     Consultants:   Interventional radiology Dr. Loreta Ave 03/09/2016  Gastroenterology: Dr. Adela Lank 03/06/2016  Neurology: Dr Otelia Limes 03/05/2016  Procedures:   CT head without contrast 03/05/2016  CT angiogram head and neck 03/06/2016  CT angiogram abdomen and pelvis BRTO protocol 03/08/2016  Abdominal ultrasound 03/07/2016  2-D echo 03/07/2016  MRI brain 03/06/2016  Upper endoscopy 03/08/2016 per Dr. Adela Lank   4 units packed red blood cells  EEG  Antimicrobials:   None   Subjective: Patient denies any chest pain. No shortness of breath. Patient with no bowel movement. No bloody bowel movements noted.  Objective: Filed Vitals:   03/09/16 0300 03/09/16 0826 03/09/16 0827 03/09/16 0923  BP: 153/77  143/103   Pulse: 85 77 85   Temp: 97.5 F (36.4 C)  97.8 F (36.6 C)   TempSrc: Oral  Oral   Resp: 15 20 20      Height:      Weight: 88.1 kg (194 lb 3.6 oz)     SpO2: 100% 99% 100% 100%    Intake/Output Summary (Last 24 hours) at 03/09/16 1043 Last data filed at 03/09/16 0900  Gross per 24 hour  Intake 3062.92 ml  Output   6925 ml  Net -3862.08 ml   Filed Weights   03/05/16 2333 03/09/16 0300  Weight: 87.3 kg (192 lb 7.4 oz) 88.1 kg (194 lb 3.6 oz)    Examination:  General exam: Appears calm and comfortable  Respiratory system: Clear to auscultation. Respiratory effort normal. Cardiovascular system: S1 & S2 heard, RRR. No JVD, murmurs, rubs, gallops or clicks. No pedal edema. Gastrointestinal system: Abdomen is nondistended, soft and nontender. No organomegaly or masses felt. Normal bowel sounds heard. Central nervous system: Alert and oriented. Slight left facial weakness. No dysarthria. Left-sided weakness.  Extremities: 5/5 Right lower extremity and right upper extremity strength.4/5 left lower extremity and left upper extremity strength. Skin: No rashes, lesions or ulcers Psychiatry: Judgement and insight appear normal. Mood & affect appropriate.     Data Reviewed: I have personally reviewed following labs and imaging studies  CBC:  Recent Labs Lab 03/05/16 1555 03/06/16 0529  03/06/16 1628 03/07/16 0213 03/07/16 1030 03/08/16 0541 03/09/16 0541  WBC 13.2* 14.3*  --   --  11.5*  --  7.9 8.4  NEUTROABS 11.5*  --   --   --  9.4*  --  5.9 6.3  HGB 4.8* 7.4*  < > 7.2* 6.6*  6.6* 7.8* 7.8* 8.7*  HCT 14.8* 22.2*  < > 22.0* 20.5*  20.8* 23.7* 23.5* 26.0*  MCV 92.5 90.2  --   --  91.6  --  88.0 87.2  PLT 192 158  --   --  178  --  192 242  < > = values in this interval not displayed. Basic Metabolic Panel:  Recent Labs Lab 03/05/16 1555 03/06/16 0529 03/07/16 0213 03/08/16 0541 03/09/16 0541 03/09/16 0826  NA 127* 134* 138 131* 125*  --   K 3.5 2.9* 3.2* 3.1* 3.5  --   CL 92* 100* 107 103 95*  --   CO2 23 24 22 23 22   --   GLUCOSE 223* 165* 126* 134* 167*  --    BUN 66* 32* 17 7 <5*  --   CREATININE 1.19 0.81 0.66 0.57* 0.50*  --   CALCIUM 8.5* 8.2* 8.0* 7.9* 8.4*  --   MG 2.3  --   --   --   --  1.8   GFR: Estimated Creatinine Clearance: 123.5 mL/min (by C-G formula based on Cr of 0.5). Liver Function Tests:  Recent Labs Lab 03/06/16 0529 03/06/16 1857 03/07/16 1433 03/08/16 0541  AST 780* 1188* 804* 423*  ALT 544* 816* 780* 601*  ALKPHOS 65 68 70 74  BILITOT 1.9* 1.4* 1.4* 1.4*  PROT 5.4* 5.3* 5.2* 5.1*  ALBUMIN 2.7* 2.6* 2.5* 2.5*   No results for input(s): LIPASE, AMYLASE in the last 168 hours.  Recent Labs Lab 03/06/16 1620  AMMONIA 13   Coagulation Profile:  Recent Labs Lab 03/06/16 0529 03/07/16 1433  INR 1.22 1.29   Cardiac Enzymes:  Recent Labs Lab 03/05/16 1555  CKTOTAL 511*  TROPONINI 0.07*   BNP (last 3 results) No results for input(s): PROBNP in the last 8760 hours. HbA1C: No results for input(s): HGBA1C in the last 72 hours. CBG: No results for input(s): GLUCAP in the last 168 hours. Lipid Profile: No results for input(s): CHOL, HDL, LDLCALC, TRIG, CHOLHDL, LDLDIRECT in the last 72 hours. Thyroid Function Tests: No results for input(s): TSH, T4TOTAL, FREET4, T3FREE, THYROIDAB in the last 72 hours. Anemia Panel: No results for input(s): VITAMINB12, FOLATE, FERRITIN, TIBC, IRON, RETICCTPCT in the last 72 hours. Sepsis Labs:  Recent Labs Lab 03/05/16 1555 03/05/16 1742  LATICACIDVEN 1.6 1.5    Recent Results (from the past 240 hour(s))  MRSA PCR Screening     Status: None   Collection Time: 03/07/16  9:28 AM  Result Value Ref Range Status   MRSA by PCR NEGATIVE NEGATIVE Final    Comment:        The GeneXpert MRSA Assay (FDA approved for NASAL specimens only), is one component of a comprehensive MRSA colonization surveillance program. It is not intended to diagnose MRSA infection nor to guide or monitor treatment for MRSA infections.          Radiology Studies: Ct Abdomen  Pelvis W Wo Contrast  03/08/2016  CLINICAL DATA:  History of ETOH, pancreatitis, and pancreatic pseudocyst and pseudoaneurysm. 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 *BRTO protocol used per Dr. Loreta Ave EXAM: CT ANGIOGRAPHY ABDOMEN AND PELVIS TECHNIQUE: Multidetector CT imaging of the abdomen and pelvis was performed using the standard protocol during bolus administration of intravenous contrast. Multiplanar reconstructed images including MIPs were obtained and reviewed to evaluate the vascular anatomy. CONTRAST:  ISOVUE-300 IOPAMIDOL (ISOVUE-300)  INJECTION 61% COMPARISON:  COMPARISON 02/16/2014 FINDINGS: ARTERIAL Aorta: Mild plaque in the infrarenal segment. No aneurysm, dissection, or stenosis. Celiac axis:          Patent. Superior mesenteric: Mild ostial plaque without stenosis. Classic distal branch anatomy. Left renal: Ostial plaque extending over length of approximately 2 cm resulting in at least moderate short segment stenosis, patent distally. Right renal: Ostial plaque extending over length of at least 13 mm, resulting in short segment moderately severe stenosis, patent distally. Inferior mesenteric:  Mild ostial stenosis, patent distally. Left iliac: Scattered plaque. No aneurysm, dissection, or stenosis. Right iliac: Minimal plaque. No aneurysm, dissection, or stenosis. VENOUS Patent hepatic veins, portal vein, SMV, and IMV. I do not identify a contiguous patent splenic vein. Early opacification of small varices in the gastric fundus, with larger collateral venous channels around the gastric body and antrum draining toward the portal vein. No significant retroperitoneal venous collateral drainage network nor any large draining into the left renal vein. Patent bilateral renal veins, IVC, and iliac venous system. Review of the MIP images confirms the above findings. NONVASCULAR Lower chest: Trace left pleural effusion. Adjacent subsegmental atelectasis/ infiltrate posteriorly in the  visualized left lower lobe. Coronary calcifications. Hepatobiliary: No masses or other significant abnormality. Gallbladder incompletely distended. Pancreas: Scattered coarse calcifications within the pancreatic head, body and tail consistent with chronic pancreatitis. The enhancing lesion seen adjacent to the tail of the pancreas on the previous study suggestive of pseudoaneurysm is not seen. There is a mixed attenuation left subphrenic pseudocyst lateral to the spleen abutting the gastric fundus and extending down to the pancreatic tail, measuring 9.9 AP x 6.9 transverse x 8.6 cm craniocaudal, and is new since prior study. There is high attenuation within the anterior aspect of the collection suggesting previous hemorrhage. Near complete resolution of the small pseudocyst seen posterior to the portosplenic confluence on the prior study, and resolution of the loculated fluid anterolateral to the stomach seen on prior exam. Spleen: Within normal limits in size and appearance. Adrenals/Urinary Tract: Bilateral adrenal hypertrophy left greater than right. Kidneys unremarkable. No hydronephrosis or nephrolithiasis. Urinary bladder is physiologically distended and there is diffuse wall thickening. Stomach/Bowel: Stomach is nondistended, distorted by the adjacent pseudocyst. Enhancing small fundal varices as discussed above. Small bowel and colon are nondilated. Normal appendix. Lymphatic: No pathologically enlarged lymph nodes. Reproductive: Moderate prostatic enlargement. Other: No free air. Musculoskeletal: Degenerative disc disease most marked L4-5 and L5-S1. IMPRESSION: 1. 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 2. 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 The pseudoaneurysm previously noted proximal to the pancreatic tail is no longer evident. 3. 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 Electronically  Signed   By: Corlis Leak M.D.   On: 03/08/2016 14:45   US Abdomen Complete  03/07/2016  CLINICAL DATA:  Alcoholic with fatty liver, pancreatitis, ascites by ultrasound and CT in 2011. Assess for progression to cirrhosis and recurrent ascites. EXAM: ABDOMEN ULTRASOUND COMPLETE COMPARISON:  02/16/2014 FINDINGS: Gallbladder: Gallbladder has a normal appearance. Gallbladder wall is 1.5 mm, within normal limits. No stones or pericholecystic fluid. No sonographic Murphy's sign. Common bile duct: Diameter: 5.8 mm Liver: The liver is echogenic. There is attenuation of the ultrasound wave, poor visualization of the internal hepatic architecture, and loss of definition of the diaphragm. No focal liver lesions are identified. IVC: No abnormality visualized. Pancreas: Not well seen because of bowel gas. Spleen: Not well seen because of  bowel gas. Right Kidney: Length: 13.6 cm. Echogenicity within normal limits. No mass or hydronephrosis visualized. Left Kidney: Length: 13.3 cm . Echogenicity within normal limits. No mass or hydronephrosis visualized. Abdominal aorta: Proximal aspect not well seen. Visualized portion is not aneurysmal, 2.4 cm. Other findings: No ascites identified. Study quality is degraded by overlying bowel gas, and level of patient cooperation. IMPRESSION: 1. Hepatic steatosis.  No focal liver lesion. 2. No ascites. Electronically Signed   By: Norva PavlovElizabeth  Brown M.D.   On: 03/07/2016 11:08        Scheduled Meds: . antiseptic oral rinse  7 mL Mouth Rinse q12n4p  . busPIRone  5 mg Oral BID  . chlorhexidine  15 mL Mouth Rinse BID  . folic acid  1 mg Intravenous Daily  . furosemide  20 mg Intravenous Once  . nicotine  21 mg Transdermal Daily  . Pancrelipase (Lip-Prot-Amyl)  1-2 capsule Oral TID WC  . pantoprazole (PROTONIX) IV  40 mg Intravenous Q12H  . sodium chloride flush  3 mL Intravenous Q12H  . thiamine  100 mg Intravenous Daily  . tiotropium  18 mcg Inhalation Daily   Continuous  Infusions:    LOS: 4 days    Time spent: 45 minutes    Clayden Withem, MD [facility] Pager [phone]  If 7PM-7AM, please contact night-coverage [email or web address] Password [email or web address] 03/09/2016, 10:43 AM

## 2016-03-09 NOTE — Progress Notes (Signed)
___________________________________________________________________________ ATTESTATION:  Agree with note.   Brandon [name] is 50 year-old male with GI bleeding history, in addition to acute right hemisphere infarction/stroke on this admission.   He has a history of chronic pancreatitis, and his admission upper endoscopy shows gastric varices, although no sign of a recent hemorrhage.  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   Discussed his imaging and potential options for any future hemorrhage with Dr Adela Lank.    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  Surgical treatment of choice for this condition is splenectomy, although in the setting of acute, life-threatening gastric variceal hemorrhage partial splenic embolization (PSE) can be considered.    CT also shows interval thrombosis/resolution of the prior pseudoaneurysm.   Given that there are at least 2 other potential etiology for his GI bleed, including (1) pseudocyst hemorrhage into the pancreatic duct, as well as (2) possible hepatic flexure hemorrhage secondary to the pancreatic pseudocyst, I would suggest a NM bleeding study as the next diagnostic test if GI bleeding continues.    If emergent and angio is required, would first prioritize interrogation of the hepatic flexure and splenic artery, and consider PSE.    Please call with questions/concerns. Pager: [phone]  Signed,  Yvone Neu. Loreta Ave, DO    ____________________________________________________________________________________    Chief Complaint: Patient was seen in consultation today for GI bleed at the request of Dr. Ileene Patrick  Referring Physician(s): Dr. Ileene Patrick  Supervising  Physician: Gilmer Mor  Patient Status: Inpatient  History of Present Illness: Brandon [name] is a 50 y.o. male admitted with GI bleed and CVA. He also has hx of acute on chronic pancreatitis with pancreatic pseudocyst. Gastric varices identified on EGD but no bleeding. No esophageal varices or PUD. CT BRTO protocol performed and reviewed last pm by Dr. Adela Lank and Dr. Loreta Ave. Pt currently resting comfortable, no distress, no hematemesis or hematochezia/melena. He has tolerated some liquid diet. Received 4 units PRBCs. IR is consulted to consider intervention if necessary. Chart, PMHx, meds, labs, imaging reviewed.  Past Medical History  Diagnosis Date  . Alcohol dependency (HCC)     Hx ETOH withdrawal seizures before 2011  . Pancreatitis     CT findings in May 2011 with inflammation and pseudocyst  . GERD (gastroesophageal reflux disease)   . CAD (coronary artery disease) 06/13/2012    Calcification noted on CTA of chest in 2012 Wall motion abnormality on ECHO    . 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 Hyperlipidemia   . Headache(784.0)     migraine  . Heart disease 02/2015    PCI/DES placed to RCA: on chronic Plavix/ASA  . Depression with anxiety   . Hypertension   . Carotid artery disease (HCC) 2016    bilateral.  s/p left carotid stent 03/2015    Past Surgical History  Procedure Laterality Date  . None    . Carotid angiogram N/A 06/15/2012    Procedure: CAROTID ANGIOGRAM;  Surgeon: Chuck Hint, MD;  Location: [facility] CATH LAB;  Service: Cardiovascular;  Laterality: N/A;  . Cardiac catheterization N/A 03/15/2015    Procedure: Left Heart Cath;  Surgeon: Laurier Nancy, MD;  Location: [facility] INVASIVE CV LAB;  Service: Cardiovascular;  Laterality: N/A;  . Cardiac catheterization N/A 03/16/2015    Procedure: Coronary Stent Intervention;  Surgeon: Alwyn Pea, MD;  Location: [facility] INVASIVE CV LAB;  Service: Cardiovascular;   Laterality: N/A;  . Peripheral vascular catheterization Left 04/06/2015    Procedure: Carotid PTA/Stent Intervention;  Surgeon: Annice Needy, MD;  Location: [facility] INVASIVE CV LAB;  Service: Cardiovascular;  Laterality: Left;    Allergies: No known allergies  Medications:  Current facility-administered medications:  .  albuterol (PROVENTIL) (2.5 MG/3ML) 0.083% nebulizer solution 3 mL, 3 mL, Inhalation, Q6H PRN, Rodolph Bong, MD .  antiseptic oral rinse (CPC / CETYLPYRIDINIUM CHLORIDE 0.05%) solution 7 mL, 7 mL, Mouth Rinse, q12n4p, Mauricio Annett Gula, MD, 7 mL at 03/08/16 1600 .  busPIRone (BUSPAR) tablet 5 mg, 5 mg, Oral, BID, Rodolph Bong, MD .  chlorhexidine (PERIDEX) 0.12 % solution 15 mL, 15 mL, Mouth Rinse, BID, Mauricio Annett Gula, MD, 15 mL at 03/08/16 2057 .  fentaNYL (SUBLIMAZE) injection 25-50 mcg, 25-50 mcg, Intravenous, Q5 min PRN, Ronelle Nigh, MD .  folic acid injection 1 mg, 1 mg, Intravenous, Daily, Mauricio Annett Gula, MD, 1 mg at 03/08/16 1625 .  furosemide (LASIX) injection 20 mg, 20 mg, Intravenous, Once, Rodolph Bong, MD .  labetalol (NORMODYNE,TRANDATE) injection 10 mg, 10 mg, Intravenous, Q4H PRN, Coralie Keens, MD .  LORazepam (ATIVAN) injection 2-3 mg, 2-3 mg, Intravenous, Q1H PRN, Coralie Keens, MD .  morphine 2 MG/ML injection 2 mg, 2 mg, Intravenous, Q4H PRN, Coralie Keens, MD, 2 mg at 03/08/16 1626 .  nicotine (NICODERM CQ - dosed in mg/24 hours) patch 21 mg, 21 mg, Transdermal, Daily, Coralie Keens, MD, 21 mg at 03/08/16 1622 .  ondansetron (ZOFRAN) injection 4 mg, 4 mg, Intravenous, Q6H PRN, Coralie Keens, MD .  Pancrelipase (Lip-Prot-Amyl) CPEP 1-2 capsule, 1-2 capsule, Oral, TID WC, Rodolph Bong, MD .  pantoprazole (PROTONIX) injection 40 mg, 40 mg, Intravenous, Q12H, Mauricio Annett Gula, MD, 40 mg at 03/08/16 2058 .  potassium chloride SA (K-DUR,KLOR-CON) CR tablet 40 mEq, 40 mEq,  Oral, Once, Rodolph Bong, MD .  sodium chloride flush (NS) 0.9 % injection 3 mL, 3 mL, Intravenous, Q12H, Mauricio Annett Gula, MD, 3 mL at 03/08/16 2058 .  thiamine (B-1) injection 100 mg, 100 mg, Intravenous, Daily, Coralie Keens, MD, 100 mg at 03/08/16 1624 .  tiotropium [facility]) inhalation capsule 18 mcg, 18 mcg, Inhalation, Daily, Rodolph Bong, MD, 18 mcg at 03/09/16 5621    Family History  Problem Relation Age of Onset  . Stroke Mother     deceased  . Coronary artery disease Mother   . Alcohol abuse Mother   . Cancer Mother   . Hypertension Father     alive  . Alcohol abuse Father   . Diabetes Father   . Kidney disease Father     Social History   Social History  . Marital Status: Single    Spouse Name: [name]  . Number of Children: 0  . Years of Education: 12   Occupational History  . unemployed     previously worked at [employer] History Main Topics  . Smoking status: Current Every Day Smoker -- 1.00 packs/day for 30 years    Types: Cigarettes    Last Attempt to Quit: 04/12/2015  . Smokeless tobacco: Never Used     Comment: smoking less  . Alcohol Use: 3.6 oz/week    6  Cans of beer per week     Comment: drinks 6-12 beer daily  . Drug Use: No  . Sexual Activity: Yes    Birth Control/ Protection: None   Other Topics Concern  . None   Social History Narrative   [place] in [place] with his sister.   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   Patients one cup of coffee every morning.           Review of Systems: A 12 point ROS discussed and pertinent positives are indicated in the HPI above.  All other systems are negative.  Review of Systems  Vital Signs: BP 143/103 mmHg  Pulse 85  Temp(Src) 97.8 F (36.6 C) (Oral)  Resp 20  Ht 6\' 1"  (1.854 m)  Wt 194 lb 3.6 oz (88.1 kg)  BMI 25.63 kg/m2  SpO2 100%  Physical Exam    Constitutional: He is oriented to person, place, and time. He appears well-developed. No distress.  HENT:  Head: Normocephalic.  Mouth/Throat: Oropharynx is clear and moist.  Neck: Normal range of motion. No tracheal deviation present.  Cardiovascular: Normal rate, regular rhythm and normal heart sounds.   Pulmonary/Chest: Effort normal and breath sounds normal. No respiratory distress.  Abdominal: Soft. He exhibits no distension and no mass. There is no tenderness.  Neurological: He is alert and oriented to person, place, and time.  Skin: Skin is warm. No rash noted. He is not diaphoretic.  Psychiatric: He has a normal mood and affect. Judgment normal.     Mallampati Score:  MD Evaluation Airway: WNL Heart: WNL Abdomen: WNL Chest/ Lungs: WNL ASA  Classification: 3 Mallampati/Airway Score: Two  Imaging: Ct Abdomen Pelvis W Wo Contrast  03/08/2016  CLINICAL DATA:  History of ETOH, pancreatitis, and pancreatic pseudocyst and pseudoaneurysm. 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 *BRTO protocol used per Dr. [name] EXAM: CT ANGIOGRAPHY ABDOMEN AND PELVIS TECHNIQUE: Multidetector CT imaging of the abdomen and pelvis was performed using the standard protocol during bolus administration of intravenous contrast. Multiplanar reconstructed images including MIPs were obtained and reviewed to evaluate the vascular anatomy. CONTRAST:  100mL ISOVUE-300 IOPAMIDOL (ISOVUE-300) INJECTION 61% COMPARISON:  COMPARISON 02/16/2014 FINDINGS: ARTERIAL Aorta: Mild plaque in the infrarenal segment. No aneurysm, dissection, or stenosis. Celiac axis:          Patent. Superior mesenteric: Mild ostial plaque without stenosis. Classic distal branch anatomy. Left renal: Ostial plaque extending over length of approximately 2 cm resulting in at least moderate short segment stenosis, patent distally. Right renal: Ostial plaque extending over length of at least 13 mm, resulting in short segment moderately severe  stenosis, patent distally. Inferior mesenteric:  Mild ostial stenosis, patent distally. Left iliac: Scattered plaque. No aneurysm, dissection, or stenosis. Right iliac: Minimal plaque. No aneurysm, dissection, or stenosis. VENOUS Patent hepatic veins, portal vein, SMV, and IMV. I do not identify a contiguous patent splenic vein. Early opacification of small varices in the gastric fundus, with larger collateral venous channels around the gastric body and antrum draining toward the portal vein. No significant retroperitoneal venous collateral drainage network nor any large draining into the left renal vein. Patent bilateral renal veins, IVC, and iliac venous system. Review of the MIP images confirms the above findings. NONVASCULAR Lower chest: Trace left pleural effusion. Adjacent subsegmental atelectasis/ infiltrate posteriorly in the visualized left lower lobe. Coronary calcifications. Hepatobiliary: No masses or other significant abnormality. Gallbladder  incompletely distended. Pancreas: Scattered coarse calcifications within the pancreatic head, body and tail consistent with chronic pancreatitis. The enhancing lesion seen adjacent to the tail of the pancreas on the previous study suggestive of pseudoaneurysm is not seen. There is a mixed attenuation left subphrenic pseudocyst lateral to the spleen abutting the gastric fundus and extending down to the pancreatic tail, measuring 9.9 AP x 6.9 transverse x 8.6 cm craniocaudal, and is new since prior study. There is high attenuation within the anterior aspect of the collection suggesting previous hemorrhage. Near complete resolution of the small pseudocyst seen posterior to the portosplenic confluence on the prior study, and resolution of the loculated fluid anterolateral to the stomach seen on prior exam. Spleen: Within normal limits in size and appearance. Adrenals/Urinary Tract: Bilateral adrenal hypertrophy left greater than right. Kidneys unremarkable. No  hydronephrosis or nephrolithiasis. Urinary bladder is physiologically distended and there is diffuse wall thickening. Stomach/Bowel: Stomach is nondistended, distorted by the adjacent pseudocyst. Enhancing small fundal varices as discussed above. Small bowel and colon are nondilated. Normal appendix. Lymphatic: No pathologically enlarged lymph nodes. Reproductive: Moderate prostatic enlargement. Other: No free air. Musculoskeletal: Degenerative disc disease most marked L4-5 and L5-S1. IMPRESSION: 1. 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 2. 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 The pseudoaneurysm previously noted proximal to the pancreatic tail is no longer evident. 3. Bilateral renal Corlis Leakostial stenosis of probable hemodynamic significance. Electronically Signed   By: D  Hassell M.D.   On: 03/08/2016 14:45     Labs:  CBC:  Recent Labs  03/06/16 0529  03/07/16 0213 03/07/16 1030 03/08/16 0541 03/09/16 0541  WBC 14.3*  --  11.5*  --  7.9 8.4  HGB 7.4*  < > 6.6*  6.6* 7.8* 7.8* 8.7*  HCT 22.2*  < > 20.5*  20.8* 23.7* 23.5* 26.0*  PLT 158  --  178  --  192 242  < > = values in this interval not displayed.  COAGS:  Recent Labs  03/06/16 0529 03/07/16 1433  INR 1.22 1.29    BMP:  Recent Labs  03/06/16 0529 03/07/16 0213 03/08/16 0541 03/09/16 0541  NA 134* 138 131* 125*  K 2.9* 3.2* 3.1* 3.5  CL 100* 107 103 95*  CO2 GLUCOSE 165* 126* 134* 167*  BUN 32* 17 7 <5*  CALCIUM 8.2* 8.0* 7.9* 8.4*  CREATININE 0.81 0.66 0.57* 0.50*  GFRNONAA >60 >60 >60 >60  GFRAA >60 >60 >60 >60    LIVER FUNCTION TESTS:  Recent Labs  03/06/16 0529 03/06/16 1857 03/07/16 1433 03/08/16 0541  BILITOT 1.9* 1.4* 1.4* 1.4*  AST 780* 1188* 804* 423*  ALT 544* 816* 780* 601*  ALKPHOS 65 68 70 74  PROT 5.4* 5.3* 5.2* 5.1*  ALBUMIN 2.7* 2.6* 2.5* 2.5*    Assessment and Plan: GI bleed, presumed  from gastric varices or hemorrhagic pancreatic pseudocyst Plan as discussed by Dr. Loreta Ave and Dr. Adela Lank.  Thank you for this interesting consult.   A copy of this report was sent to the requesting provider on this date.  Electronically Signed: Brayton El 03/09/2016, 10:13 AM   I spent a total of 25 minutes in face to face in clinical consultation, greater than 50% of which was counseling/coordinating care for GI bleed.

## 2016-03-10 DIAGNOSIS — G9340 Encephalopathy, unspecified: Secondary | ICD-10-CM

## 2016-03-10 LAB — COMPREHENSIVE METABOLIC PANEL
ALBUMIN: 2.6 g/dL — AB (ref 3.5–5.0)
ALK PHOS: 95 U/L (ref 38–126)
ALT: 322 U/L — ABNORMAL HIGH (ref 17–63)
ANION GAP: 8 (ref 5–15)
AST: 101 U/L — ABNORMAL HIGH (ref 15–41)
BILIRUBIN TOTAL: 0.7 mg/dL (ref 0.3–1.2)
BUN: 6 mg/dL (ref 6–20)
CALCIUM: 8.8 mg/dL — AB (ref 8.9–10.3)
CO2: 25 mmol/L (ref 22–32)
Chloride: 98 mmol/L — ABNORMAL LOW (ref 101–111)
Creatinine, Ser: 0.66 mg/dL (ref 0.61–1.24)
GFR calc Af Amer: >60 mL/min (ref >60)
GLUCOSE: 213 mg/dL — AB (ref 65–99)
Potassium: 3.8 mmol/L (ref 3.5–5.1)
Sodium: 131 mmol/L — ABNORMAL LOW (ref 135–145)
TOTAL PROTEIN: 5.8 g/dL — AB (ref 6.5–8.1)

## 2016-03-10 LAB — CBC
HCT: 28.1 % — ABNORMAL LOW (ref 39.0–52.0)
HEMOGLOBIN: 9 g/dL — AB (ref 13.0–17.0)
MCH: 28.5 pg (ref 26.0–34.0)
MCHC: 32 g/dL (ref 30.0–36.0)
MCV: 88.9 fL (ref 78.0–100.0)
Platelets: 280 10*3/uL (ref 150–400)
RBC: 3.16 MIL/uL — ABNORMAL LOW (ref 4.22–5.81)
RDW: 17.7 % — AB (ref 11.5–15.5)
WBC: 9.3 10*3/uL (ref 4.0–10.5)

## 2016-03-10 NOTE — Progress Notes (Signed)
PROGRESS NOTE    Brandon [name]  [name] DOB: Sep 09, 1966 DOA: 03/05/2016 PCP: Brandon NancyKHAN,SHAUKAT A, MD    Brief Narrative:  50 year old gentleman with history of coronary artery disease status post stent to the RCA 03/16/2015 on dual antiplatelet therapy, history of chronic right occluded ICA and status post stent to the left ICA, history of chronic alcohol abuse, chronic pancreatitis presented with acute onset left hemiplegia and left-sided weakness in addition to [name] GI bleed. Neurological workup consistent with [name] right MCA stroke. Patient noted to be significantly anemic on admission with [name] hemoglobin of 4.8. Status post 4 units packed red blood cells. Patient underwent upper endoscopy which showed gastric varices. CT angiogram abdomen and pelvis assistant with pancreatic pseudocyst and splenic vein thrombosis. Patient on IV PPI. GI following. If continued bleeding will likely need tagged red blood cell study with IR intervention. Ultimately needs splenectomy by general surgery. GI, IR, neurology consulted.   Assessment & Plan:   Principal Problem:   UGIB (upper gastrointestinal bleed) Active Problems:   Hypertension   GERD (gastroesophageal reflux disease)   Chronic Pancreatitis.   Ischemic Stroke   Alcohol Dependence   CAD in native artery   CVA (cerebral infarction)   Pressure ulcer   Acute encephalopathy   Left-sided neglect   Severe anemia   Gastric varices   Alcohol abuse   Hyponatremia   Splenic vein thrombosis   Pancreatic pseudocyst   Acute blood loss anemia  #1 upper GI bleed/large gastric varices/splenic vein thrombosis Questionable etiology. Likely secondary to [name] gastric variceal bleed versus splenic vein thrombosis. Patient on admission had [name] hemoglobin of 4.8. Status post 4 units packed red blood cells hemoglobin currently at 9.0 Patient with no further bleeding appreciated at this time.  Patient has been seen in consultation by gastroenterology and underwent  upper endoscopy which showed large gastric varices without bleeding.  CT angiogram abdomen and pelvis with BRTO protocol with splenic vein thrombosis and large pancreatic pseudocyst which could likely be hemorrhagic.  Patient likely will ultimately require splenectomy however [name] poor surgical candidate at this time.  Continue IV PPI. Currently tolerating clears and will defer advancement of diet to GI.  Continue Serial CBC's.  Continue to hold aspirin and Plavix. Defer to GI when aspirin may be resumed for secondary stroke prevention and coronary artery disease.   #2 acute blood loss anemia Secondary to problem #1. Status post 4 units packed red blood cells.  Hemoglobin was 4.8 on admission.  Currently 9.0.  Follow H&H trend.  #3 acute right brain stroke Patient with left hemiparesis which is slowly improving. Tpa not administered secondary to delay in arrival as well as acute bleed.  MRI with acute infarct in the right MCA territory. 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  Left ICA stent patent.  2-D echo with [name] normal EF, no wall portion abnormalities and no source of acute emboli.  EEG was normal.  Hemoglobin A1c 5.8.  Patient was on aspirin and Plavix prior to admission although not routinely taken it (according to history provided by him) Currently not on any anti-thrombotic agents secondary to acute GI bleed.  PT/OT recommending CIR Will defer to GI when patient may be placed on aspirin for secondary stroke prevention.  Neurology is on board and will follow any further rec's.  #4 hyponatremia Likely secondary to hypervolemic hyponatremia and chronic alcohol use.  Patient was transfused 4 units packed red blood cells during this  hospitalization as well as hydrated with IV fluids due to acute acute GI bleed.  On Saline lock IV fluids now.  Follow electrolytes trend.  #5 hyperlipidemia Fasting lipid panel with LDL of 37.  Goal is less than  70.  Statin on hold due to elevated LFT's.  Resume statin when able to, at discharge.  #6 hypertension Permissive hypertension; with subsequent further control as part of risk factors modifications .  #7 tobacco abuse Tobacco cessation provided.  Continue Nicotine patch.  #8 history of alcohol abuse/alcohol dependence Ativan withdrawal protocol. Continue buspar at adjusted dose  #9 transaminitis Questionable etiology. Likely secondary to alcohol abuse vs secondary to shock liver. Acute hepatitis panel negative. LFTs trending down.  Follow trend .  #10 acute encephalopathy Likely secondary to acute GI bleed and CVA. Concerns for alcohol withdrawal given heavy drinking status  Improving/stable Continue CIWA Follow Hgb and transfuse as needed   #11 chronic pancreatitis with pancreatic pseudocyst Likely secondary to chronic alcohol use. Continue Pancrease.   DVT prophylaxis: SCDs Code Status: Full Family Communication: Updated patient. Significant other updated by phone as well  Disposition Plan: Remain in the step down unit. Pending GI evaluation and further recommendations. As per PT rec's will ask CIR to assess for candidacy .     Consultants:   Interventional radiology Dr. Loreta [name] 03/09/2016  Gastroenterology: Dr. Adela [name] 03/06/2016  Neurology: Dr Brandon [name] 03/05/2016  Procedures:   CT head without contrast 03/05/2016  CT angiogram head and neck 03/06/2016  CT angiogram abdomen and pelvis BRTO protocol 03/08/2016  Abdominal ultrasound 03/07/2016  2-D echo 03/07/2016  MRI brain 03/06/2016  Upper endoscopy 03/08/2016 per Dr. Adela [name]   4 units packed red blood cells  EEG  Antimicrobials:   None   Subjective: Patient denies any chest pain. No shortness of breath. Patient with black stools on 6/17 evening; none since then. Hgb remains stable. hemodynamically stable   Objective: Filed Vitals:   03/10/16 0800 03/10/16 0951 03/10/16 1152 03/10/16  1226  BP: 108/75  118/73 123/79  Pulse:    97  Temp:    99.4 F (37.4 C)  TempSrc:    Oral  Resp:    19  Height:      Weight:      SpO2:  99%  98%    Intake/Output Summary (Last 24 hours) at 03/10/16 1359 Last data filed at 03/10/16 0900  Gross per 24 hour  Intake    600 ml  Output   2775 ml  Net  -2175 ml   Filed Weights   03/05/16 2333 03/09/16 0300 03/10/16 0324  Weight: 87.3 kg (192 lb 7.4 oz) 88.1 kg (194 lb 3.6 oz) 85.6 kg (188 lb 11.4 oz)    Examination: General exam: Appears calm and comfortable; no CP, no SOB. Reports some black stools on 6/17; none since. Respiratory system: Clear to auscultation. Respiratory effort normal. Cardiovascular system: S1 & S2 heard, RRR. No JVD, murmurs, rubs, gallops or clicks. No pedal edema. Gastrointestinal system: Abdomen is nondistended, soft and nontender. No organomegaly or masses felt. Normal bowel sounds heard. Central nervous system: Alert and oriented. Slight left facial droop. No dysarthria. Left-sided weakness.  Extremities: 5/5 Right lower extremity and right upper extremity strength.4/5 left lower extremity and left upper extremity strength. Skin: No rashes, lesions or ulcers Psychiatry: Judgement and insight appear normal. Mood & affect appropriate.   Data Reviewed: I have personally reviewed following labs and imaging studies  CBC:  Recent Labs Lab 03/05/16 1555 03/06/16  [id number]  03/07/16 0213 03/07/16 1030 03/08/16 0541 03/09/16 0541 03/09/16 1553 03/10/16 0510  WBC 13.2* 14.3*  --  11.5*  --  7.9 8.4  --  9.3  NEUTROABS 11.5*  --   --  9.4*  --  5.9 6.3  --   --   HGB 4.8* 7.4*  < > 6.6*  6.6* 7.8* 7.8* 8.7* 9.1* 9.0*  HCT 14.8* 22.2*  < > 20.5*  20.8* 23.7* 23.5* 26.0* 27.6* 28.1*  MCV 92.5 90.2  --  91.6  --  88.0 87.2  --  88.9  PLT 192 158  --  178  --  192 242  --  280  < > = values in this interval not displayed. Basic Metabolic Panel:  Recent Labs Lab 03/05/16 1555  03/07/16 0213  03/08/16 0541 03/09/16 0541 03/09/16 0826 03/09/16 1553 03/10/16 0510  NA 127*  < > 138 131* 125*  --  126* 131*  K 3.5  < > 3.2* 3.1* 3.5  --  4.0 3.8  CL 92*  < > 107 103 95*  --  98* 98*  CO2 23  < > --  21* 25  GLUCOSE 223*  < > 126* 134* 167*  --  292* 213*  BUN 66*  < > 17 7 <5*  --  7 6  CREATININE 1.19  < > 0.66 0.57* 0.50*  --  0.72 0.66  CALCIUM 8.5*  < > 8.0* 7.9* 8.4*  --  8.4* 8.8*  MG 2.3  --   --   --   --  1.8  --   --   < > = values in this interval not displayed. GFR: Estimated Creatinine Clearance: 123.5 mL/min (by C-G formula based on Cr of 0.66).   Liver Function Tests:  Recent Labs Lab 03/06/16 0529 03/06/16 1857 03/07/16 1433 03/08/16 0541 03/10/16 0510  AST 780* 1188* 804* 423* 101*  ALT 544* 816* 780* 601* 322*  ALKPHOS 65 68 70 74 95  BILITOT 1.9* 1.4* 1.4* 1.4* 0.7  PROT 5.4* 5.3* 5.2* 5.1* 5.8*  ALBUMIN 2.7* 2.6* 2.5* 2.5* 2.6*   No results for input(s): LIPASE, AMYLASE in the last 168 hours.  Recent Labs Lab 03/06/16 1620  AMMONIA 13   Coagulation Profile:  Recent Labs Lab 03/06/16 0529 03/07/16 1433  INR 1.22 1.29   Cardiac Enzymes:  Recent Labs Lab 03/05/16 1555  CKTOTAL 511*  TROPONINI 0.07*   Sepsis Labs:  Recent Labs Lab 03/05/16 1555 03/05/16 1742  LATICACIDVEN 1.6 1.5    Recent Results (from the past 240 hour(s))  MRSA PCR Screening     Status: None   Collection Time: 03/07/16  9:28 AM  Result Value Ref Range Status   MRSA by PCR NEGATIVE NEGATIVE Final    Comment:        The GeneXpert MRSA Assay (FDA approved for NASAL specimens only), is one component of [name] comprehensive MRSA colonization surveillance program. It is not intended to diagnose MRSA infection nor to guide or monitor treatment for MRSA infections.      Radiology Studies: No results found.   Scheduled Meds: . antiseptic oral rinse  7 mL Mouth Rinse q12n4p  . busPIRone  5 mg Oral BID  . cefTRIAXone (ROCEPHIN)  IV  1 g  Intravenous Q24H  . chlorhexidine  15 mL Mouth Rinse BID  . folic acid  1 mg Intravenous Daily  . lipase/protease/amylase  2 capsule Oral TID WC  .  nicotine  21 mg Transdermal Daily  . pantoprazole (PROTONIX) IV  40 mg Intravenous Q12H  . sodium chloride flush  3 mL Intravenous Q12H  . thiamine  100 mg Intravenous Daily  . tiotropium  18 mcg Inhalation Daily   Continuous Infusions:    LOS: 5 days    Time spent: 35 minutes    Vassie Loll, MD [facility] Pager [phone]  If 7PM-7AM, please contact night-coverage [email or web address] Password [facility] 03/10/2016, 1:59 PM

## 2016-03-10 NOTE — Progress Notes (Signed)
[facility]  Subjective: 50 year old white male with a history of CAD s/p stent to the RCA 03/16/2015 on dual antiplatelet therapy, history of chronic right occluded ICA and s/p stent to the left ICA, history of chronic alcohol abuse, chronic pancreatitis presented with acute onset left hemiplegia and left-sided weakness and GI bleed. Neurological workup revealed a right MCA stroke. Patient noted to be significantly anemic on admission with a hemoglobin of 4.8 gm/dl. Patient underwent upper endoscopy which revealed gastric varices. CT angiogram abdomen and pelvis revealed pancreatic pseudocyst with SVT. Patient on IV PPI. GI following. If continued bleeding will likely need tagged red blood cell study with IR intervention.   Objective: Vital signs in last 24 hours: Temp:  [98 F (36.7 C)-99.4 F (37.4 C)] 99.4 F (37.4 C) (06/18 1226) Pulse Rate:  [76-101] 97 (06/18 1226) Resp:  [14-20] 19 (06/18 1226) BP: (100-123)/(68-79) 123/79 mmHg (06/18 1226) SpO2:  [96 %-100 %] 98 % (06/18 1226) Weight:  [85.6 kg (188 lb 11.4 oz)] 85.6 kg (188 lb 11.4 oz) (06/18 0324) Last BM Date: 03/09/16  Intake/Output from previous day: 06/17 0701 - 06/18 0700 In: 100 [P.O.:100] Out: 4475 [Urine:4475] Intake/Output this shift: Total I/O In: 600 [P.O.:600] Out: -   General appearance: distracted, fatigued, no distress and pale Resp: clear to auscultation bilaterally Cardio: regular rate and rhythm, S1, S2 normal, no murmur, click, rub or gallop GI: soft, non-tender; bowel sounds normal; no masses,  no organomegaly  Lab Results:  Recent Labs  03/08/16 0541 03/09/16 0541 03/09/16 1553 03/10/16 0510  WBC 7.9 8.4  --  9.3  HGB 7.8* 8.7* 9.1* 9.0*  HCT 23.5* 26.0* 27.6* 28.1*  PLT 192 242  --  280   BMET  Recent Labs  03/09/16 0541 03/09/16 1553 03/10/16 0510  NA 125* 126* 131*  K 3.5 4.0 3.8  CL 95* 98* 98*  CO2 22 21* 25  GLUCOSE 167* 292* 213*  BUN <5* 7 6  CREATININE 0.50* 0.72  0.66  CALCIUM 8.4* 8.4* 8.8*   LFT  Recent Labs  03/10/16 0510  PROT 5.8*  ALBUMIN 2.6*  AST 101*  ALT 322*  ALKPHOS 95  BILITOT 0.7   Medications: I have reviewed the patient's current medications.  Assessment/Plan: 1) Post-hemorrhagic iron deficiency anemia with a history gastric varices and psuedocyst-source of blood loss not clear-tagged RBC planned if he rebleeds.  2) History of alcohol abuse with gastric varices, chronic pancreatitis and pancreatic psuedocyst..     LOS: 5 days   Jinna Weinman 03/10/2016, 3:36 PM

## 2016-03-11 ENCOUNTER — Encounter (HOSPITAL_COMMUNITY): Payer: Self-pay | Admitting: Physician Assistant

## 2016-03-11 DIAGNOSIS — K863 Pseudocyst of pancreas: Secondary | ICD-10-CM

## 2016-03-11 DIAGNOSIS — I864 Gastric varices: Secondary | ICD-10-CM

## 2016-03-11 DIAGNOSIS — K219 Gastro-esophageal reflux disease without esophagitis: Secondary | ICD-10-CM

## 2016-03-11 DIAGNOSIS — K760 Fatty (change of) liver, not elsewhere classified: Secondary | ICD-10-CM | POA: Insufficient documentation

## 2016-03-11 DIAGNOSIS — I2510 Atherosclerotic heart disease of native coronary artery without angina pectoris: Secondary | ICD-10-CM

## 2016-03-11 DIAGNOSIS — K860 Alcohol-induced chronic pancreatitis: Secondary | ICD-10-CM

## 2016-03-11 DIAGNOSIS — E119 Type 2 diabetes mellitus without complications: Secondary | ICD-10-CM | POA: Insufficient documentation

## 2016-03-11 DIAGNOSIS — D62 Acute posthemorrhagic anemia: Secondary | ICD-10-CM

## 2016-03-11 DIAGNOSIS — E871 Hypo-osmolality and hyponatremia: Secondary | ICD-10-CM

## 2016-03-11 DIAGNOSIS — D735 Infarction of spleen: Secondary | ICD-10-CM

## 2016-03-11 DIAGNOSIS — N390 Urinary tract infection, site not specified: Secondary | ICD-10-CM | POA: Insufficient documentation

## 2016-03-11 DIAGNOSIS — I6930 Unspecified sequelae of cerebral infarction: Secondary | ICD-10-CM | POA: Insufficient documentation

## 2016-03-11 DIAGNOSIS — K922 Gastrointestinal hemorrhage, unspecified: Secondary | ICD-10-CM

## 2016-03-11 DIAGNOSIS — R000 Tachycardia, unspecified: Secondary | ICD-10-CM | POA: Insufficient documentation

## 2016-03-11 DIAGNOSIS — I63511 Cerebral infarction due to unspecified occlusion or stenosis of right middle cerebral artery: Principal | ICD-10-CM

## 2016-03-11 DIAGNOSIS — Z720 Tobacco use: Secondary | ICD-10-CM | POA: Insufficient documentation

## 2016-03-11 DIAGNOSIS — I10 Essential (primary) hypertension: Secondary | ICD-10-CM

## 2016-03-11 DIAGNOSIS — F1010 Alcohol abuse, uncomplicated: Secondary | ICD-10-CM

## 2016-03-11 LAB — COMPREHENSIVE METABOLIC PANEL
ALK PHOS: 103 U/L (ref 38–126)
ALT: 253 U/L — ABNORMAL HIGH (ref 17–63)
ANION GAP: 7 (ref 5–15)
AST: 67 U/L — ABNORMAL HIGH (ref 15–41)
Albumin: 2.8 g/dL — ABNORMAL LOW (ref 3.5–5.0)
BUN: 8 mg/dL (ref 6–20)
CALCIUM: 9.1 mg/dL (ref 8.9–10.3)
CO2: 24 mmol/L (ref 22–32)
CREATININE: 0.63 mg/dL (ref 0.61–1.24)
Chloride: 100 mmol/L — ABNORMAL LOW (ref 101–111)
Glucose, Bld: 248 mg/dL — ABNORMAL HIGH (ref 65–99)
Potassium: 4.2 mmol/L (ref 3.5–5.1)
SODIUM: 131 mmol/L — AB (ref 135–145)
TOTAL PROTEIN: 6.4 g/dL — AB (ref 6.5–8.1)
Total Bilirubin: 0.7 mg/dL (ref 0.3–1.2)

## 2016-03-11 LAB — CBC
HCT: 29.9 % — ABNORMAL LOW (ref 39.0–52.0)
HEMOGLOBIN: 9.5 g/dL — AB (ref 13.0–17.0)
MCH: 29 pg (ref 26.0–34.0)
MCHC: 31.8 g/dL (ref 30.0–36.0)
MCV: 91.2 fL (ref 78.0–100.0)
PLATELETS: 359 10*3/uL (ref 150–400)
RBC: 3.28 MIL/uL — AB (ref 4.22–5.81)
RDW: 17.8 % — ABNORMAL HIGH (ref 11.5–15.5)
WBC: 10.3 10*3/uL (ref 4.0–10.5)

## 2016-03-11 LAB — GLUCOSE, CAPILLARY
GLUCOSE-CAPILLARY: 330 mg/dL — AB (ref 65–99)
Glucose-Capillary: 220 mg/dL — ABNORMAL HIGH (ref 65–99)

## 2016-03-11 LAB — URINE CULTURE: Culture: >=100000 — AB

## 2016-03-11 MED ORDER — FOLIC ACID 1 MG PO TABS
1.0000 mg | ORAL_TABLET | Freq: Every day | ORAL | Status: DC
  Administered 2016-03-11 – 2016-03-12 (×2): 1 mg via ORAL
  Filled 2016-03-11 (×2): qty 1

## 2016-03-11 MED ORDER — VITAMIN B-1 100 MG PO TABS
100.0000 mg | ORAL_TABLET | Freq: Every day | ORAL | Status: DC
  Administered 2016-03-11 – 2016-03-12 (×2): 100 mg via ORAL
  Filled 2016-03-11 (×2): qty 1

## 2016-03-11 MED ORDER — INSULIN ASPART 100 UNIT/ML ~~LOC~~ SOLN
0.0000 [IU] | Freq: Three times a day (TID) | SUBCUTANEOUS | Status: DC
  Administered 2016-03-11: 7 [IU] via SUBCUTANEOUS
  Administered 2016-03-12: 2 [IU] via SUBCUTANEOUS
  Administered 2016-03-12 (×2): 3 [IU] via SUBCUTANEOUS

## 2016-03-11 MED ORDER — PANTOPRAZOLE SODIUM 40 MG PO TBEC
40.0000 mg | DELAYED_RELEASE_TABLET | Freq: Every day | ORAL | Status: DC
  Administered 2016-03-11: 40 mg via ORAL
  Filled 2016-03-11: qty 1

## 2016-03-11 MED ORDER — PANTOPRAZOLE SODIUM 40 MG PO TBEC
40.0000 mg | DELAYED_RELEASE_TABLET | Freq: Two times a day (BID) | ORAL | Status: DC
  Administered 2016-03-11 – 2016-03-12 (×2): 40 mg via ORAL
  Filled 2016-03-11 (×2): qty 1

## 2016-03-11 NOTE — H&P (Signed)
Physical Medicine and Rehabilitation Admission H&P    Chief Complaint  Patient presents with  . Loss of Consciousness  : HPI: Brandon [name] is a 50 y.o. 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 Patient lives with sister. Reportedly independent prior to admission. Sister does work during the day. Plans to stay with his girlfriend in [place] who also works part time. Presented 03/05/2016 with altered mental status and easily fatigued. He was found by a friend lying on the couch confused covered in feces and urine. MRI of the brain showed acute infarcts scattered in the right middle cerebral artery territory. CT angiogram of head and neck showed chronic occlusion right ICA. Left carotid stent was patent. Occlusion proximal right vertebral artery. Echocardiogram with ejection fraction of 55-60% no wall motion abnormalities. Patient did not receive TPA. Ultrasound the abdomen showed no focal lesions or ascites. 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 Findings of hemoglobin 4.8 and transfused. Underwent upper GI endoscopy 03/08/2016 per Dr. Havery Moros with findings of large gastric varices without bleeding. Low-dose aspirin 81 mg daily resumed 03/12/2016. No plans to resume Plavix at this time. Full liquid diet Advanced to mechanical soft. Urine culture greater than 100,000 gram-negative rods maintained on Rocephin changed to Ceftin 500 mg twice daily 5 days 03/12/2016. Hyponatremia 125-131 and monitored. Physical therapy evaluation completed 03/09/2016 with recommendations of physical medicine rehabilitation consult.Patient was admitted for a comprehensive rehabilitation program  ROS Constitutional: Negative for fever and  chills.  HENT: Negative for hearing loss.  Eyes: Negative for blurred vision and double vision.  Respiratory: Positive for cough.   Shortness of breath with heavy exertion  Cardiovascular: Negative for chest pain.  Gastrointestinal: Positive for nausea and constipation. Negative for vomiting.   GERD  Skin: Negative for rash.  Neurological: Positive for weakness and headaches. Negative for seizures.  Psychiatric/Behavioral: Positive for depression.   Anxiety  All other systems reviewed and are negative   Past Medical History  Diagnosis Date  . Alcohol dependency ([place])     Hx ETOH withdrawal seizures before 2011  . Pancreatitis 2011....     CT findings in May 2011 with inflammation and pseudocyst.  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/2016  . GERD (gastroesophageal reflux disease)   . CAD (coronary artery disease) 06/13/2012    Calcification noted on CTA of chest in 2012 Wall motion abnormality on ECHO    . 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   Marland Kitchen Hyperlipidemia   . Headache(784.0)     migraine  . Heart disease 02/2015    PCI/DES placed to RCA: on chronic Plavix/ASA  . Depression with anxiety   . Hypertension   . Carotid artery disease ([place]) 2016    bilateral.  s/p left carotid stent 03/2015  . Renal artery stenosis, native, bilateral ([place]) 02/2015   Past Surgical History  Procedure Laterality Date  . None    . Carotid angiogram N/A 06/15/2012    Procedure: CAROTID ANGIOGRAM;  Surgeon: Angelia Mould, MD;  Location: [facility] CATH LAB;  Service: Cardiovascular;  Laterality: N/A;  . Cardiac catheterization N/A 03/15/2015    Procedure: Left Heart Cath;  Surgeon: Dionisio David, MD;  Location: [place]  CV LAB;  Service: Cardiovascular;  Laterality: N/A;  . Cardiac catheterization N/A 03/16/2015    Procedure: Coronary Stent Intervention;  Surgeon: Yolonda Kida, MD;  Location: [place] CV LAB;   Service: Cardiovascular;  Laterality: N/A;  . Peripheral vascular catheterization Left 04/06/2015    Procedure: Carotid PTA/Stent Intervention;  Surgeon: Algernon Huxley, MD;  Location: [place] CV LAB;  Service: Cardiovascular;  Laterality: Left;  . Esophagogastroduodenoscopy N/A 03/08/2016    Procedure: ESOPHAGOGASTRODUODENOSCOPY (EGD);  Surgeon: Manus Gunning, MD;  Location: [place];  Service: Gastroenterology;  Laterality: N/A;   Family History  Problem Relation Age of Onset  . Stroke Mother     deceased  . Coronary artery disease Mother   . Alcohol abuse Mother   . Cancer Mother   . Hypertension Father     alive  . Alcohol abuse Father   . Diabetes Father   . Kidney disease Father    Social History:  reports that he has been smoking Cigarettes.  He has a 30 pack-year smoking history. He has never used smokeless tobacco. He reports that he drinks about 3.6 oz of alcohol per week. He reports that he does not use illicit drugs. Allergies:  Allergies  Allergen Reactions  . No Known Allergies    Medications Prior to Admission  Medication Sig Dispense Refill  . albuterol (PROAIR HFA) 108 (90 Base) MCG/ACT inhaler Inhale 2 puffs into the lungs every 6 (six) hours as needed for wheezing or shortness of breath.     Marland Kitchen albuterol (PROVENTIL) (2.5 MG/3ML) 0.083% nebulizer solution Take 2.5 mg by nebulization as needed for wheezing or shortness of breath.    Marland Kitchen aspirin 81 MG tablet Take 81 mg by mouth every morning.     . B Complex Vitamins (VITAMIN-B COMPLEX PO) Take 1 tablet by mouth daily.     . busPIRone (BUSPAR) 5 MG tablet Take 1 tablet (5 mg total) by mouth 2 (two) times daily.    . cefUROXime (CEFTIN) 500 MG tablet Take 1 tablet (500 mg total) by mouth 2 (two) times daily with a meal.    . folic acid (FOLVITE) 1 MG tablet Take 1 mg by mouth daily.      Marland Kitchen GARLIC OIL 1308 PO Take 1,500 mg by mouth daily.     . insulin aspart (NOVOLOG) 100 UNIT/ML injection Inject 0-9 Units  into the skin 3 (three) times daily with meals.    . insulin glargine (LANTUS) 100 UNIT/ML injection Inject 0.05 mLs (5 Units total) into the skin daily.    Marland Kitchen lisinopril (PRINIVIL,ZESTRIL) 10 MG tablet Take 10 mg by mouth daily.    Marland Kitchen lubiprostone (AMITIZA) 24 MCG capsule Take 24 mcg by mouth as needed for constipation.    . Multiple Vitamin (MULTIVITAMIN) tablet Take 1 tablet by mouth daily.    . nadolol (CORGARD) 40 MG tablet Take 1 tablet (40 mg total) by mouth daily. 30 tablet 1  . nicotine (NICODERM CQ - DOSED IN MG/24 HOURS) 21 mg/24hr patch Place 1 patch (21 mg total) onto the skin daily. 28 patch 0  . Omega-3 Fatty Acids (FISH OIL PO) Take 1 capsule by mouth daily.    Marland Kitchen omeprazole (PRILOSEC) 20 MG capsule Take 2 capsules (40 mg total) by mouth daily. 60 capsule 1  . Pancrelipase, Lip-Prot-Amyl, (ZENPEP) 25000 UNITS CPEP Take 2 capsules by mouth 3 (three) times daily with meals.     . simvastatin (ZOCOR) 20 MG tablet Take 20 mg  by mouth daily at 6 PM.     . tiotropium (SPIRIVA HANDIHALER) 18 MCG inhalation capsule Place 18 mcg into inhaler and inhale daily.     . traMADol (ULTRAM) 50 MG tablet Take 1 tablet (50 mg total) by mouth every 8 (eight) hours as needed for severe pain. for pain    . traZODone (DESYREL) 150 MG tablet Take 1 tablet (150 mg total) by mouth at bedtime. 30 tablet 1    Home: Home Living Family/patient expects to be discharged to:: Private residence Living Arrangements:  (Living with sister for 9 months. Befored that lived with his) Available Help at Discharge: Friend(s), Available 24 hours/day Estill Bamberg states she will arrange 24/7 assist for d/c) Type of Home: House Home Access: Stairs to enter [employer] of Steps: 5 Entrance Stairs-Rails: Right, Left Home Layout: Two level Alternate Level Stairs-Number of Steps: flight Bathroom Shower/Tub: Optometrist: Yes Home Equipment: None Additional  Comments: states house is handicapped accessible  Lives With: Family (sister for 9 months )   Functional History: Prior Function Level of Independence: Independent  Functional Status:  Mobility: Bed Mobility Overal bed mobility: Needs Assistance Bed Mobility: Supine to Sit Supine to sit: +2 for physical assistance, Mod assist General bed mobility comments: OOB in chair Transfers Overall transfer level: Needs assistance Equipment used: 2 person hand held assist, Ambulation equipment used Transfer via [place]: [place] Transfers: Sit to/from Stand Sit to Stand: +2 physical assistance, Mod assist Squat pivot transfers: +2 physical assistance, Mod assist General transfer comment: Stood with RW. Poor orientation in RW. Stood again with B arms over therapist's shoulders.Max physical assist to shift weightonto RLE. Unable to maintain midline postural control      PYK:DXIPJAS ADL's : Needs assistance/impaired Eating/Feeding: Set up;Sitting Eating/Feeding Details (indicate cue type and reason): ate all of food on R side of tray before attending to L side of tray Grooming: Minimal assistance;Sitting   Upper Body Bathing: Minimal assitance;Sitting   Lower Body Bathing: Moderate assistance;Sit to/from stand   Upper Body Dressing : Moderate assistance Upper Body Dressing Details (indicate cue type and reason): Placed R arm in gown then unable to problem solve how to get LUE into gown Lower Body Dressing: Moderate assistance Lower Body Dressing Details (indicate cue type and reason): Able to start putting sock on L foot. Unaware L hand not helping       Toileting - Clothing Manipulation Details (indicate cue type and reason): foley     Functional mobility during ADLs: Moderate assistance;+2 for physical assistance;Cueing for safety;Cueing for sequencing General ADL Comments: L lateral lean. Unable to maintain midline. Unable to maintain control of L hip/knee ADL Overall ADL's :  Needs assistance/impaired Eating/Feeding: Set up, Sitting Eating/Feeding Details (indicate cue type and reason): ate all of food on R side of tray before attending to L side of tray Grooming: Minimal assistance, Sitting Upper Body Bathing: Minimal assitance, Sitting Lower Body Bathing: Moderate assistance, Sit to/from stand Upper Body Dressing : Moderate assistance Upper Body Dressing Details (indicate cue type and reason): Placed R arm in gown then unable to problem solve how to get LUE into gown Lower Body Dressing: Moderate assistance Lower Body Dressing Details (indicate cue type and reason): Able to start putting sock on L foot. Unaware L hand not helping Toileting - Clothing Manipulation Details (indicate cue type and reason): foley Functional mobility during ADLs: Moderate assistance, +2 for physical assistance, Cueing for safety, Cueing for sequencing General ADL Comments:  L lateral lean. Unable to maintain midline. Unable to maintain control of L hip/knee  Cognition: Cognition Overall Cognitive Status: Impaired/Different from baseline Arousal/Alertness: Awake/alert Orientation Level: Oriented X4 Attention: Sustained Sustained Attention: Impaired Sustained Attention Impairment: Verbal basic Memory: Impaired Memory Impairment: Storage deficit Awareness:  (intellectual awareness appears adequate) Behaviors: Impulsive Cognition Arousal/Alertness: Awake/alert Behavior During Therapy: Impulsive Overall Cognitive Status: Impaired/Different from baseline Area of Impairment: Orientation, Attention, Memory, Safety/judgement, Problem solving Orientation Level: Disoriented to, Situation Current Attention Level: Sustained Memory: Decreased recall of precautions, Decreased short-term memory Safety/Judgement: Decreased awareness of safety, Decreased awareness of deficits Problem Solving: Slow processing, Requires verbal cues, Requires tactile cues General Comments: Lt  inattention/neglect; states hes not really having hard time with anything since his stroke  Physical Exam: Blood pressure 134/94, pulse 108, temperature 97.9 F (36.6 C), temperature source Oral, resp. rate 18, height _0  (1.854 m), weight 85.1 kg (187 lb 9.8 oz), SpO2 98 %. Physical Exam Constitutional: He appears well-developed and well-nourished.  HENT:  Head: Normocephalic and atraumatic.  Eyes: EOM are normal. Right eye exhibits no discharge. Left eye exhibits no discharge.  Neck: Normal range of motion. Neck supple. No thyromegaly present.  Cardiovascular: Normal rate and regular rhythm.  Respiratory: Effort normal and breath sounds normal. No respiratory distress.  GI: Soft. Bowel sounds are normal. He exhibits no distension.  Musculoskeletal: He exhibits no edema or tenderness.  Neurological: He is alert.  Mood is flat but appropriate.  He is able to provide his name, Age as well as place. Poor insight and awareness. Memory fair. Decreased problem solving. He follows simple commands. DTRs symmetric Motor: Right upper extremity/right lower extremity: 5/5 proximal to distal Left upper extremity: 4/5 delt, bicep, wrist, tricep, HI with motor apraxia and ?inattention,  Left lower extremity: 3/5 HF, KE, ADF/PF with apraxia and ?inattention  -decreased sense of pain LUE and LLE but inconsistent. Skin: Skin is warm and dry.  Psychiatric: He has a normal mood and affect. His behavior is normal   Results for orders placed or performed during the hospital encounter of 03/05/16 (from the past 48 hour(s))  Comprehensive metabolic panel     Status: Abnormal   Collection Time: 03/11/16  4:35 AM  Result Value Ref Range   Sodium 131 (L) 135 - 145 mmol/L   Potassium 4.2 3.5 - 5.1 mmol/L   Chloride 100 (L) 101 - 111 mmol/L   CO2 24 22 - 32 mmol/L   Glucose, Bld 248 (H) 65 - 99 mg/dL   BUN 8 6 - 20 mg/dL   Creatinine, Ser 0.63 0.61 - 1.24 mg/dL   Calcium 9.1 8.9 - 10.3 mg/dL   Total  Protein 6.4 (L) 6.5 - 8.1 g/dL   Albumin 2.8 (L) 3.5 - 5.0 g/dL   AST 67 (H) 15 - 41 U/L   ALT 253 (H) 17 - 63 U/L   Alkaline Phosphatase 103 38 - 126 U/L   Total Bilirubin 0.7 0.3 - 1.2 mg/dL   GFR calc non Af Amer >60 >60 mL/min   GFR calc Af Amer >60 >60 mL/min    Comment: (NOTE) The eGFR has been calculated using the CKD EPI equation. This calculation has not been validated in all clinical situations. eGFR's persistently <60 mL/min signify possible Chronic Kidney Disease.    Anion gap 7 5 - 15  CBC     Status: Abnormal   Collection Time: 03/11/16  4:35 AM  Result Value Ref Range   WBC 10.3 4.0 -  10.5 K/uL   RBC 3.28 (L) 4.22 - 5.81 MIL/uL   Hemoglobin 9.5 (L) 13.0 - 17.0 g/dL   HCT 29.9 (L) 39.0 - 52.0 %   MCV 91.2 78.0 - 100.0 fL   MCH 29.0 26.0 - 34.0 pg   MCHC 31.8 30.0 - 36.0 g/dL   RDW 17.8 (H) 11.5 - 15.5 %   Platelets 359 150 - 400 K/uL  Hemoglobin A1c     Status: None   Collection Time: 03/11/16  4:19 PM  Result Value Ref Range   Hgb A1c MFr Bld 5.6 4.8 - 5.6 %    Comment: (NOTE)         Pre-diabetes: 5.7 - 6.4         Diabetes: >6.4         Glycemic control for adults with diabetes: <7.0    Mean Plasma Glucose 114 mg/dL    Comment: (NOTE) Performed At: [facility] [place], [place] [id number] Lindon Romp MD PO:[id number]   Glucose, capillary     Status: Abnormal   Collection Time: 03/11/16  4:23 PM  Result Value Ref Range   Glucose-Capillary 330 (H) 65 - 99 mg/dL  Glucose, capillary     Status: Abnormal   Collection Time: 03/11/16 10:11 PM  Result Value Ref Range   Glucose-Capillary 220 (H) 65 - 99 mg/dL   Comment 1 Notify RN    Comment 2 Document in Chart   Comprehensive metabolic panel     Status: Abnormal   Collection Time: 03/12/16  5:10 AM  Result Value Ref Range   Sodium 130 (L) 135 - 145 mmol/L   Potassium 3.8 3.5 - 5.1 mmol/L   Chloride 99 (L) 101 - 111 mmol/L   CO2 24 22 - 32 mmol/L   Glucose, Bld 201 (H) 65  - 99 mg/dL   BUN 7 6 - 20 mg/dL   Creatinine, Ser 0.63 0.61 - 1.24 mg/dL   Calcium 8.9 8.9 - 10.3 mg/dL   Total Protein 5.9 (L) 6.5 - 8.1 g/dL   Albumin 2.6 (L) 3.5 - 5.0 g/dL   AST 52 (H) 15 - 41 U/L   ALT 186 (H) 17 - 63 U/L   Alkaline Phosphatase 100 38 - 126 U/L   Total Bilirubin 0.5 0.3 - 1.2 mg/dL   GFR calc non Af Amer >60 >60 mL/min   GFR calc Af Amer >60 >60 mL/min    Comment: (NOTE) The eGFR has been calculated using the CKD EPI equation. This calculation has not been validated in all clinical situations. eGFR's persistently <60 mL/min signify possible Chronic Kidney Disease.    Anion gap 7 5 - 15  CBC     Status: Abnormal   Collection Time: 03/12/16  5:10 AM  Result Value Ref Range   WBC 10.1 4.0 - 10.5 K/uL   RBC 2.84 (L) 4.22 - 5.81 MIL/uL   Hemoglobin 8.2 (L) 13.0 - 17.0 g/dL   HCT 25.9 (L) 39.0 - 52.0 %   MCV 91.2 78.0 - 100.0 fL   MCH 28.9 26.0 - 34.0 pg   MCHC 31.7 30.0 - 36.0 g/dL   RDW 17.5 (H) 11.5 - 15.5 %   Platelets 403 (H) 150 - 400 K/uL  Glucose, capillary     Status: Abnormal   Collection Time: 03/12/16  6:55 AM  Result Value Ref Range   Glucose-Capillary 199 (H) 65 - 99 mg/dL   Comment 1 Notify RN    Comment  2 Document in Chart   Glucose, capillary     Status: Abnormal   Collection Time: 03/12/16 11:13 AM  Result Value Ref Range   Glucose-Capillary 226 (H) 65 - 99 mg/dL  Glucose, capillary     Status: Abnormal   Collection Time: 03/12/16  5:02 PM  Result Value Ref Range   Glucose-Capillary 262 (H) 65 - 99 mg/dL   No results found.     Medical Problem List and Plan: 1.  Altered mental status with right sided weakness secondary to right MCA territory infarctAs well as history of CVA September 2013 with residual left-sided weakness  -admit to inpatient rehab 2.  DVT Prophylaxis/Anticoagulation: SCDs. Monitor for any signs of DVT 3. Pain Management: Tylenol as needed 4. Mood: BuSpar 5 mg twice a day 5. Neuropsych: This patient is capable  of making decisions on his own behalf. 6. Skin/Wound Care: Routine skin checks 7. Fluids/Electrolytes/Nutrition: Routine I&O with follow-up chemistries upon admit 8. Upper GI bleed. Follow-up per gastroenterology. Continue Protonix/follow-up CBC. . 9. Ischemic cardiac heart disease. No chest pain or shortness of breath. Aspirin 81 mg daily. No Plavix at this time due to GI bleed 10. Hypertension. Corgard 40 mg daily. Follow with increased mobility 11. Hyponatremia. 125-131. Follow-up chemistries on admit 12. Diabetes mellitus. Hemoglobin A1c 5.6. Lantus insulin 5 units daily. Check blood sugars before meals and at bedtime. Patient diet controlled prior to admission. 13. Pancreatitis. Continue Creon 24,000 units 3 times a day 14. Alcohol tobacco abuse. Nicoderm patch. Provide counseling 15. Gram-negative rod UTI. Ceftin 500 mg twice daily 03/12/2016 5 more days and stop  Post Admission Physician Evaluation: 1. Functional deficits secondary  to right MCA infarct. 2. Patient is admitted to receive collaborative, interdisciplinary care between the physiatrist, rehab nursing staff, and therapy team. 3. Patient's level of medical complexity and substantial therapy needs in context of that medical necessity cannot be provided at a lesser intensity of care such as a SNF. 4. Patient has experienced substantial functional loss from his/her baseline which was documented above under the "Functional History" and "Functional Status" headings.  Judging by the patient's diagnosis, physical exam, and functional history, the patient has potential for functional progress which will result in measurable gains while on inpatient rehab.  These gains will be of substantial and practical use upon discharge  in facilitating mobility and self-care at the household level. 5. Physiatrist will provide 24 hour management of medical needs as well as oversight of the therapy plan/treatment and provide guidance as appropriate  regarding the interaction of the two. 6. 24 hour rehab nursing will assist with bladder management, bowel management, safety, skin/wound care, disease management, medication administration, pain management and patient education  and help integrate therapy concepts, techniques,education, etc. 7. PT will assess and treat for/with: Lower extremity strength, range of motion, stamina, balance, functional mobility, safety, adaptive techniques and equipment, NMR, visual-spatial awareness, cognitive behavioral rx.   Goals are: supervision to min assist.. 8. OT will assess and treat for/with: ADL's, functional mobility, safety, upper extremity strength, adaptive techniques and equipment, NMR, visual-spatial awareness, cognitive behavioral rx, family ed.   Goals are: supervision to min assist. Therapy may proceed with showering this patient. 9. SLP will assess and treat for/with: cognition communication and education.  Goals are: supervision to min assist. 10. Case Management and Social Worker will assess and treat for psychological issues and discharge planning. 11. Team conference will be held weekly to assess progress toward goals and to determine barriers to discharge.  12. Patient will receive at least 3 hours of therapy per day at least 5 days per week. 13. ELOS: 18-22 days       14. Prognosis:  excellent     Meredith Staggers, MD, [facility] 03/12/2016

## 2016-03-11 NOTE — Clinical Social Work Note (Signed)
Patient transferred from 3S to [facility]. 5C CSW given handoff information.  This CSW signing off.  Charlynn CourtSarah Adelin Ventrella, CSW [phone]

## 2016-03-11 NOTE — Progress Notes (Signed)
Daily Rounding Note  03/11/2016, 8:34 AM  LOS: 6 days   SUBJECTIVE:       Last had large black stool 6/17 @ 1700.    OBJECTIVE:         Vital signs in last 24 hours:    Temp:  [98.4 F (36.9 C)-99.4 F (37.4 C)] 98.5 F (36.9 C) (06/19 0711) Pulse Rate:  [85-113] 113 (06/19 0711) Resp:  [12-21] 21 (06/19 0711) BP: (104-141)/(68-90) 104/68 mmHg (06/19 0711) SpO2:  [98 %-100 %] 100 % (06/19 0711) Weight:  [85.1 kg (187 lb 9.8 oz)] 85.1 kg (187 lb 9.8 oz) (06/19 0308) Last BM Date: 03/09/16 Filed Weights   03/09/16 0300 03/10/16 0324 03/11/16 0308  Weight: 88.1 kg (194 lb 3.6 oz) 85.6 kg (188 lb 11.4 oz) 85.1 kg (187 lb 9.8 oz)   General: sleeping quietly, not difficult to awaken   Heart: Regular, tachy.   Chest: clear bil.  No labored breathing or cough. Abdomen: soft, active BS.  No mass or HSM.  Not tender.   Extremities: no CCE Neuro/Psych:  Alert, follows commands.  Left leg>> left arm weakness.  Some UE tremor on right UE.    Intake/Output from previous day: 06/18 0701 - 06/19 0700 In: 1130 [P.O.:1080; IV Piggyback:50] Out: 2225 [Urine:2225]  Intake/Output this shift:    Lab Results:  Recent Labs  03/09/16 0541 03/09/16 1553 03/10/16 0510 03/11/16 0435  WBC 8.4  --  9.3 10.3  HGB 8.7* 9.1* 9.0* 9.5*  HCT 26.0* 27.6* 28.1* 29.9*  PLT 242  --  280 359   BMET  Recent Labs  03/09/16 1553 03/10/16 0510 03/11/16 0435  NA 126* 131* 131*  K 4.0 3.8 4.2  CL 98* 98* 100*  CO2 21* 25 24  GLUCOSE 292* 213* 248*  BUN 7 6 8   CREATININE 0.72 0.66 0.63  CALCIUM 8.4* 8.8* 9.1   LFT  Recent Labs  03/10/16 0510 03/11/16 0435  PROT 5.8* 6.4*  ALBUMIN 2.6* 2.8*  AST 101* 67*  ALT 322* 253*  ALKPHOS 95 103  BILITOT 0.7 0.7   PT/INR No results for input(s): LABPROT, INR in the last 72 hours. Hepatitis Panel No results for input(s): HEPBSAG, HCVAB, HEPAIGM, HEPBIGM in the last 72  hours.  Studies/Results: No results found.   Scheduled Meds: . antiseptic oral rinse  7 mL Mouth Rinse q12n4p  . busPIRone  5 mg Oral BID  . cefTRIAXone (ROCEPHIN)  IV  1 g Intravenous Q24H  . chlorhexidine  15 mL Mouth Rinse BID  . folic acid  1 mg Oral Daily  . lipase/protease/amylase  2 capsule Oral TID WC  . nicotine  21 mg Transdermal Daily  . pantoprazole (PROTONIX) IV  40 mg Intravenous Q12H  . sodium chloride flush  3 mL Intravenous Q12H  . thiamine  100 mg Oral Daily  . tiotropium  18 mcg Inhalation Daily   Continuous Infusions:  PRN Meds:.albuterol, fentaNYL (SUBLIMAZE) injection, labetalol, LORazepam, morphine injection, ondansetron (ZOFRAN) IV  ASSESMENT:   *  Right MCA stroke.   *  Chronic, long-term alcoholism.    *  Anemia (Hgb 4.8), dark/FOBT + stool.  Hgb stable post PRBC x 4.   EGD: gastric varix.  The large appearance of this may be result of mass effect from pseudocyst.   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  No evidence of cirrhosis.   On BID IV Protonix.  Ultimately needs splenectomy but currently not an op candidate, not yet seen by gen surgery.  No evidence of recurrent or ongoing bleeding.    *  Hx acute and chronic alcoholic pancreatitis.  Now with large pseudocyst.  On Pancrease.   *  DM.  New diagnosis. No steroids on meds lists.    *   03/16/2015 cardiac cath (unstable angina) with DES to RCA: Supposed to be on Plavix and 81 mg ASA PTA.     PLAN   *  Switch to oral PPI.   *  Advance from full liquids to a soft diet.    *  ? Need to add meds for glucose control?   *  ? When ok to restart Plavix?  And ? Was he actually taking this PTA?   *  Eternal ETOH abstinence.        Brandon [name]  03/11/2016, 8:34 AM Pager: [phone]

## 2016-03-11 NOTE — Progress Notes (Signed)
I will follow up with pt and fiance to discuss a possible admit to inpt rehab when medically ready. Discussed with Dr. Gwenlyn PerkingMadera who predicts medically ready tomorrow. [phone]

## 2016-03-11 NOTE — Progress Notes (Signed)
Occupational Therapy Evaluation Patient Details Name: Brandon [name] MRN: [id number] DOB: [birth year] Today's Date: 03/11/2016    History of Present Illness Pt adm with lt sided weakness and UGI bleed. 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 PMH - ETOH, CAD, pancreatitis   Clinical Impression   PTA, pt lived with sister and was independent with ADL and mobility. Pt currently requires mod A with ADL and mod A +2 with sit - stand/pivot transfers. Pt with decreased awareness of L visual field/environment and apparent visual perceptual deficits in addition to below deficits. Pt is an excellent CIR candidate. Will follow acutely to maximize functional level of independence and D/C to next venue of care.     Follow Up Recommendations  CIR;Supervision/Assistance - 24 hour    Equipment Recommendations  3 in 1 bedside comode;Tub/shower bench    Recommendations for Other Services Rehab consult     Precautions / Restrictions Precautions Precautions: Fall Restrictions Weight Bearing Restrictions: No      Mobility Bed Mobility               General bed mobility comments: OOB in chair  Transfers Overall transfer level: Needs assistance Equipment used: 2 person hand held assist;Ambulation equipment used Transfers: Sit to/from Stand Sit to Stand: +2 physical assistance;Mod assist         General transfer comment: Stood with RW. Poor orientation in RW. Stood again with B arms over therapist's shoulders.Max physical assist to shift weightonto RLE. Unable to maintain midline postural control    Balance     Sitting balance-Leahy Scale: Poor Sitting balance - Comments: RUE support and min A for static sitting. Progressed to min guard     Standing balance-Leahy Scale: Poor Standing balance comment: L lateral lean                            ADL Overall ADL's : Needs assistance/impaired Eating/Feeding: Set up;Sitting Eating/Feeding  Details (indicate cue type and reason): ate all of food on R side of tray before attending to L side of tray Grooming: Minimal assistance;Sitting   Upper Body Bathing: Minimal assitance;Sitting   Lower Body Bathing: Moderate assistance;Sit to/from stand   Upper Body Dressing : Moderate assistance Upper Body Dressing Details (indicate cue type and reason): Placed R arm in gown then unable to problem solve how to get LUE into gown Lower Body Dressing: Moderate assistance Lower Body Dressing Details (indicate cue type and reason): Able to start putting sock on L foot. Unaware L hand not helping       Toileting - Clothing Manipulation Details (indicate cue type and reason): foley     Functional mobility during ADLs: Moderate assistance;+2 for physical assistance;Cueing for safety;Cueing for sequencing General ADL Comments: L lateral lean. Unable to maintain midline. Unable to maintain control of L hip/knee     Vision Vision Assessment?: Vision impaired- to be further tested in functional context Additional Comments: L visual inattention. will further assess   Perception Perception Perception Tested?: Yes Perception Deficits: Inattention/neglect;Spatial orientation Inattention/Neglect: Impaired- to be further tested in functional context;Does not attend to left side of body Spatial deficits: will further assess Comments: poor awareness of midline; o/5 targets identified on L during double simultaneous stimulation.   Praxis Praxis Praxis tested?: Deficits Deficits: Motor Impersistence Praxis-Other Comments: ? L limb apraxia    Pertinent Vitals/Pain Pain Assessment: 0-10 Pain Score: 8  Pain Location: low back pain Pain Descriptors /  Indicators: Aching Pain Intervention(s): Limited activity within patient's tolerance     Hand Dominance Right   Extremity/Trunk Assessment Upper Extremity Assessment Upper Extremity Assessment: LUE deficits/detail LUE Deficits / Details:  generalized weakness. Demonstrates isolated movement LUE but requries cues to use LUE. Able to grasp can, wash cloth. Able  to reach L hand behind head. Unable to hold L shoulder 1t 90 for greater than 2 seconds  LUE Sensation: decreased light touch;decreased proprioception (staes arm feels "funny") LUE Coordination: decreased fine motor;decreased gross motor   Lower Extremity Assessment LLE Deficits / Details: ankle 1/5, knee 2-/5, hip 2-/5   Cervical / Trunk Assessment Cervical / Trunk Assessment: Other exceptions;Kyphotic Cervical / Trunk Exceptions: forward head; L bias   Communication Communication Communication: No difficulties   Cognition Arousal/Alertness: Awake/alert Behavior During Therapy: Impulsive Overall Cognitive Status: Impaired/Different from baseline Area of Impairment: Orientation;Attention;Memory;Safety/judgement;Problem solving Orientation Level: Disoriented to;Situation Current Attention Level: Sustained Memory: Decreased recall of precautions;Decreased short-term memory   Safety/Judgement: Decreased awareness of safety;Decreased awareness of deficits   Problem Solving: Slow processing;Requires verbal cues;Requires tactile cues General Comments: Lt inattention/neglect; states hes not really having hard time with anything since his stroke   General Comments       Exercises       Shoulder Instructions      Home Living Family/patient expects to be discharged to:: Private residence Living Arrangements: Other relatives (sister) Available Help at Discharge: Family;Available PRN/intermittently Type of Home: House Home Access: Stairs to enter Entergy CorporationEntrance Stairs-Number of Steps: 5 Entrance Stairs-Rails: Right;Left Home Layout: Two level Alternate Level Stairs-Number of Steps: flight   Bathroom Shower/Tub: Tub/shower unit Shower/tub characteristics: Engineer, building servicesCurtain Bathroom Toilet: Standard Bathroom Accessibility: Yes How Accessible: Accessible via walker Home  Equipment: None   Additional Comments: states house is handicapped accessible  Lives With: Family (sister works 1rst shift)    Prior Functioning/Environment Level of Independence: Independent             OT Diagnosis: Generalized weakness;Cognitive deficits;Disturbance of vision;Hemiplegia non-dominant side   OT Problem List: Decreased strength;Decreased range of motion;Impaired balance (sitting and/or standing);Decreased activity tolerance;Impaired vision/perception;Decreased coordination;Decreased cognition;Decreased safety awareness;Decreased knowledge of use of DME or AE;Decreased knowledge of precautions;Cardiopulmonary status limiting activity;Impaired sensation;Impaired tone;Impaired UE functional use;Pain   OT Treatment/Interventions: Self-care/ADL training;Therapeutic exercise;Neuromuscular education;Energy conservation;DME and/or AE instruction;Therapeutic activities;Cognitive remediation/compensation;Visual/perceptual remediation/compensation;Patient/family education;Balance training    OT Goals(Current goals can be found in the care plan section) Acute Rehab OT Goals Patient Stated Goal: to walk OT Goal Formulation: With patient Time For Goal Achievement: 03/25/16 Potential to Achieve Goals: Good ADL Goals Pt Will Perform Upper Body Bathing: with set-up;with supervision;sitting Pt Will Perform Lower Body Bathing: with min guard assist;sit to/from stand Pt Will Perform Upper Body Dressing: with set-up;with supervision;sitting Pt Will Perform Lower Body Dressing: with min guard assist;sit to/from stand Pt Will Transfer to Toilet: with min assist;bedside commode;stand pivot transfer Additional ADL Goal #1: Pt will demonstrate organized scanning pattern during ADL task with min vc  OT Frequency: Min 3X/week   Barriers to D/C:            Co-evaluation PT/OT/SLP Co-Evaluation/Treatment: Yes Reason for Co-Treatment: Necessary to address cognition/behavior during  functional activity;For patient/therapist safety   OT goals addressed during session: ADL's and self-care      End of Session Equipment Utilized During Treatment: Gait belt;Rolling walker Nurse Communication: Mobility status  Activity Tolerance: Patient tolerated treatment well Patient left: in chair;with call bell/phone within reach;with chair alarm set   Time: 1358-1440 OT  Time Calculation (min): 42 min Charges:  OT General Charges $OT Visit: 1 Procedure OT Evaluation $OT Eval Moderate Complexity: 1 Procedure OT Treatments $Self Care/Home Management : 8-22 mins G-Codes:    Zamar Odwyer,HILLARY 04/09/16, 2:08 PM   [facility], OTR/L  [phone] 09-Apr-2016

## 2016-03-11 NOTE — Progress Notes (Signed)
Patient admitted to 915C06. Patient is alert, oriented x3, flat affect. No skin issues noted, no pressure ulcers present, patient is incontinent. Bed alarm on, RN explained call bell, telephone. Will continue to monitor.

## 2016-03-11 NOTE — Progress Notes (Signed)
Physical Therapy Treatment Patient Details Name: Brandon [name] MRN: [id number] DOB: 22-Feb-1966 Today's Date: 03/11/2016    History of Present Illness Pt adm with lt sided weakness and UGI bleed. 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 PMH - ETOH, CAD, pancreatitis    PT Comments    Patient progressing with pre-gait activities this session.  Also able to isolate movement on L LE in nonweight bearing position.  Tolerated without mod c/o fatigue and HR max 142.  Will benefit from CIR level rehab at d/c for interdisciplinary therapies to address issues and allow d/c home with family support.    Follow Up Recommendations  CIR     Equipment Recommendations  Other (comment) (TBA)    Recommendations for Other Services Rehab consult     Precautions / Restrictions Precautions Precautions: Fall Restrictions Weight Bearing Restrictions: No    Mobility  Bed Mobility               General bed mobility comments: OOB in chair  Transfers Overall transfer level: Needs assistance Equipment used: 2 person hand held assist;Ambulation equipment used Transfers: Sit to/from Stand Sit to Stand: +2 physical assistance;Mod assist         General transfer comment: Stood with RW. Poor orientation in RW. Stood again with B arms over therapist's shoulders.Max physical assist to shift weightonto RLE. Unable to maintain midline postural control  Ambulation/Gait                 Stairs            Wheelchair Mobility    Modified Rankin (Stroke Patients Only) Modified Rankin (Stroke Patients Only) Pre-Morbid Rankin Score: No significant disability Modified Rankin: Severe disability     Balance Overall balance assessment: Needs assistance Sitting-balance support: Feet supported Sitting balance-Leahy Scale: Poor Sitting balance - Comments: sitting edge of chair with mod factilitation for trunk extension and head centered over shoulders for about  10 seconds maintained.    Standing balance support: Bilateral upper extremity supported Standing balance-Leahy Scale: Poor Standing balance comment: mod A of 2 for standing with walker versus "three amigos" with mod facilitation through trunk for midline orientation, L trunk and hip extension with support at knee; responded momentarily to traction of L side with increased activation; standing two trials with assist for over 30 seconds each trial with lateral weight shifts (noted resistand to R lateral weight shift)                    Cognition Arousal/Alertness: Awake/alert Behavior During Therapy: Impulsive Overall Cognitive Status: Impaired/Different from baseline Area of Impairment: Orientation;Attention;Memory;Safety/judgement;Problem solving Orientation Level: Disoriented to;Situation Current Attention Level: Sustained Memory: Decreased recall of precautions;Decreased short-term memory   Safety/Judgement: Decreased awareness of safety;Decreased awareness of deficits   Problem Solving: Slow processing;Requires verbal cues;Requires tactile cues General Comments: Lt inattention/neglect; states hes not really having hard time with anything since his stroke    Exercises General Exercises - Lower Extremity Long Arc Quad: AROM;Left;10 reps;Seated (cues to achieve full extension) Hip Flexion/Marching: AROM;AAROM;Left;10 reps;Seated;Right (reciprocal with R) Toe Raises: AAROM;Left;10 reps;Seated    General Comments        Pertinent Vitals/Pain Pain Assessment: 0-10 Pain Score: 8  Pain Location: low back pain Pain Descriptors / Indicators: Aching Pain Intervention(s): Limited activity within patient's tolerance;Repositioned    Home Living Family/patient expects to be discharged to:: Private residence Living Arrangements: Other relatives (sister) Available Help at Discharge: Family;Available PRN/intermittently Type of Home:  House Home Access: Stairs to enter Entrance  Stairs-Rails: Right;Left Home Layout: Two level Home Equipment: None Additional Comments: states house is handicapped accessible    Prior Function Level of Independence: Independent          PT Goals (current goals can now be found in the care plan section) Acute Rehab PT Goals Patient Stated Goal: to walk Progress towards PT goals: Progressing toward goals    Frequency  Min 4X/week    PT Plan      Co-evaluation PT/OT/SLP Co-Evaluation/Treatment: Yes Reason for Co-Treatment: For patient/therapist safety;Necessary to address cognition/behavior during functional activity PT goals addressed during session: Mobility/safety with mobility;Balance;Strengthening/ROM OT goals addressed during session: ADL's and self-care     End of Session Equipment Utilized During Treatment: Gait belt Activity Tolerance: Patient tolerated treatment well Patient left: in chair;with call bell/phone within reach;with chair alarm set     Time: 1331-1357 PT Time Calculation (min) (ACUTE ONLY): 26 min  Charges:  $Neuromuscular Re-education: 8-22 mins                    G Codes:      Elray McgregorCynthia Wynn 03/11/2016, 2:57 PM  Sheran Lawlessyndi Wynn, PT [phone] 03/11/2016

## 2016-03-11 NOTE — Progress Notes (Signed)
Speech Language Pathology Treatment: Dysphagia;Cognitive-Linquistic  Patient Details Name: Brandon [name] MRN: [id number] DOB: [birth year] Today's Date: 03/11/2016 Time: [id number] SLP Time Calculation (min) (ACUTE ONLY): 13 min  Assessment / Plan / Recommendation Clinical Impression  Pt was advanced to a soft diet this morning by GI. SLP observed pt with soft solids and thin liquids by straw with seemingly appropriate oropharyngeal function. Min cues provided for slower pacing due to impulsivity, but no overt s/s of aspiration seen. He is more oriented this morning, utilizing the calendar on his wall for temporal orientation with Mod I. He also seems to be demonstrating some improved attention to his left side as he is cleaning his hands post-PO intake. He does still need Mod cues for anticipatory awareness. Will continue to follow for cognition. Will defer all further diet advancements to MD.   HPI HPI: 50 y.o. male with history of ischemic heart disease and CVA presenting with L sided weakness, found to have right MCA infarctions and upper GI bleed.       SLP Plan  Continue with current plan of care     Recommendations  Diet recommendations: Regular;Thin liquid (advance per MD) Liquids provided via: Cup;Straw Medication Administration: Whole meds with liquid Supervision: Patient able to self feed;Intermittent supervision to cue for compensatory strategies Compensations: Minimize environmental distractions;Slow rate;Small sips/bites Postural Changes and/or Swallow Maneuvers: Seated upright 90 degrees;Upright 30-60 min after meal             Oral Care Recommendations: Oral care BID Follow up Recommendations: Inpatient Rehab;24 hour supervision/assistance Plan: Continue with current plan of care     GO               Brandon [name], Brandon [name] [phone]  Brandon [name], Brandon [name] 03/11/2016, 10:51 AM

## 2016-03-11 NOTE — Progress Notes (Signed)
PROGRESS NOTE    Brandon [name]  ZOX:[id number] DOB: [birth year] DOA: 03/05/2016 PCP: Laurier Nancy, MD    Brief Narrative:  51 year old gentleman with history of coronary artery disease status post stent to the RCA 03/16/2015 on dual antiplatelet therapy, history of chronic right occluded ICA and status post stent to the left ICA, history of chronic alcohol abuse, chronic pancreatitis presented with acute onset left hemiplegia and left-sided weakness in addition to a GI bleed. Neurological workup consistent with a right MCA stroke. Patient noted to be significantly anemic on admission with a hemoglobin of 4.8. Status post 4 units packed red blood cells. Patient underwent upper endoscopy which showed gastric varices. CT angiogram abdomen and pelvis assistant with pancreatic pseudocyst and splenic vein thrombosis. Patient on IV PPI. GI following. If continued bleeding will likely need tagged red blood cell study with IR intervention. Ultimately needs splenectomy by general surgery. GI, IR, neurology consulted.   Assessment & Plan:   Principal Problem:   UGIB (upper gastrointestinal bleed) Active Problems:   Hypertension   GERD (gastroesophageal reflux disease)   Chronic Pancreatitis.   Ischemic Stroke   Alcohol Dependence   CAD in native artery   CVA (cerebral infarction)   Pressure ulcer   Acute encephalopathy   Left-sided neglect   Severe anemia   Gastric varices   Alcohol abuse   Hyponatremia   Splenic vein thrombosis   Pancreatic pseudocyst   Acute blood loss anemia  #1 upper GI bleed/large gastric varices/splenic vein thrombosis Questionable etiology. Likely secondary to a gastric variceal bleed versus splenic vein thrombosis. Patient on admission had a hemoglobin of 4.8. Status post 4 units packed red blood cells hemoglobin currently at 9.0 Patient with no further bleeding appreciated at this time.  Patient has been seen in consultation by gastroenterology and underwent  upper endoscopy which showed large gastric varices without bleeding.  CT angiogram abdomen and pelvis with BRTO protocol with splenic vein thrombosis and large pancreatic pseudocyst which could likely be hemorrhagic.  Patient likely will ultimately require splenectomy however a poor surgical candidate at this time.  Continue PPI, but will transition to PO. Currently tolerating full liquid diet and per GI rec's will advance to soft diet.  Continue Serial CBC's.  Continue to hold aspirin and Plavix. Defer to GI when aspirin may be resumed for secondary stroke prevention and coronary artery disease.   #2 acute blood loss anemia Secondary to problem #1. Status post 4 units packed red blood cells.  Hemoglobin was 4.8 on admission.  Currently 9.5.  Follow H&H trend.  #3 acute right brain stroke Patient with left hemiparesis which is slowly improving. Tpa not administered secondary to delay in arrival as well as acute bleed.  MRI with acute infarct in the right MCA territory. 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  Left ICA stent patent.  2-D echo with a normal EF, no wall portion abnormalities and no source of acute emboli.  EEG was normal.  Hemoglobin A1c pending (last one on records 5.8).  Patient was on aspirin and Plavix prior to admission although not routinely taken it (according to history provided by him) Currently not on any anti-thrombotic agents secondary to acute GI bleed.  PT/OT recommending CIR Will defer to GI when patient may be place back on aspirin for secondary stroke prevention.  Neurology is on board and will follow any further rec's as well.  #4 hyponatremia Likely secondary to hypervolemic hyponatremia and  chronic alcohol use.  Patient was transfused 4 units packed red blood cells during this hospitalization as well as hydrated with IV fluids due to acute acute GI bleed.  IVF's On Saline lock now.  Follow electrolytes trend.  #5  hyperlipidemia Fasting lipid panel with LDL of 37.  Goal is less than 70.  Statin on hold due to elevated LFT's.  Resume statin when able to, at discharge.  #6 hypertension Permissive hypertension; with subsequent further control as part of risk factors modifications .  #7 tobacco abuse Tobacco cessation provided.  Continue Nicotine patch.  #8 history of alcohol abuse/alcohol dependence Ativan withdrawal protocol. Continue buspar at adjusted dose Continue folic acid and thiamine   #9 transaminitis Questionable etiology. Likely secondary to alcohol abuse vs secondary to shock liver. Acute hepatitis panel negative. LFTs trending down.  Follow trend .  #10 acute encephalopathy Likely secondary to acute GI bleed and CVA. Concerns for alcohol withdrawal given heavy drinking status  Improving/stable Continue CIWA Follow Hgb and transfuse as needed   #11 chronic pancreatitis with pancreatic pseudocyst Likely secondary to chronic alcohol use. Continue Pancrease.  #12 E. Coli and Klebsiella UTI -continue treatment with rocephin    DVT prophylaxis: SCDs Code Status: Full Family Communication: Updated patient. Significant other updated by phone as well  Disposition Plan: will transfer to telemetry. Follow GI and neurology rec's. CIR assessment for for candidacy ordered .     Consultants:   Interventional radiology Dr. Loreta Ave 03/09/2016  Gastroenterology: Dr. Adela Lank 03/06/2016  Neurology: Dr Otelia Limes 03/05/2016  Procedures:   CT head without contrast 03/05/2016  CT angiogram head and neck 03/06/2016  CT angiogram abdomen and pelvis BRTO protocol 03/08/2016  Abdominal ultrasound 03/07/2016  2-D echo 03/07/2016  MRI brain 03/06/2016  Upper endoscopy 03/08/2016 per Dr. Adela Lank   4 units packed red blood cells  EEG  Antimicrobials:  Anti-infectives    Start     Dose/Rate Route Frequency Ordered Stop   03/09/16 2000  cefTRIAXone (ROCEPHIN) 1 g in  dextrose 5 % 50 mL IVPB     1 g 100 mL/hr over 30 Minutes Intravenous Every 24 hours 03/09/16 1900        Subjective: Patient denies any chest pain. No shortness of breath. Patient with last black stools on 6/17 evening; none since then. Hgb remains stable/increasing. Has remained hemodynamically stable   Objective: Filed Vitals:   03/11/16 0711 03/11/16 0800 03/11/16 0939 03/11/16 1157  BP: 104/68 104/68 104/68   Pulse: 113 100 101   Temp: 98.5 F (36.9 C)   98.3 F (36.8 C)  TempSrc: Oral   Oral  Resp: Height:      Weight:      SpO2: 100% 97% 100%     Intake/Output Summary (Last 24 hours) at 03/11/16 1222 Last data filed at 03/11/16 1100  Gross per 24 hour  Intake   1013 ml  Output   2650 ml  Net  -1637 ml   Filed Weights   03/09/16 0300 03/10/16 0324 03/11/16 0308  Weight: 88.1 kg (194 lb 3.6 oz) 85.6 kg (188 lb 11.4 oz) 85.1 kg (187 lb 9.8 oz)    Examination: General exam: Appears calm and comfortable; no CP, no SOB. Reports some black stools on 6/17; none since. Respiratory system: Clear to auscultation. Respiratory effort normal. Cardiovascular system: S1 & S2 heard, RRR. No JVD, murmurs, rubs, gallops or clicks. No pedal edema. Gastrointestinal system: Abdomen is nondistended, soft and nontender. No  organomegaly or masses felt. Normal bowel sounds heard. Central nervous system: Alert and oriented. Slight left facial droop. No dysarthria. Left-sided weakness.  Extremities: 5/5 Right lower extremity and right upper extremity strength.4/5 left lower extremity and left upper extremity strength. Skin: No rashes, lesions or ulcers Psychiatry: Judgement and insight appear normal. Mood & affect appropriate.   Data Reviewed: I have personally reviewed following labs and imaging studies  CBC:  Recent Labs Lab 03/05/16 1555  03/07/16 0213  03/08/16 0541 03/09/16 0541 03/09/16 1553 03/10/16 0510 03/11/16 0435  WBC 13.2*  < > 11.5*  --  7.9 8.4  --  9.3  10.3  NEUTROABS 11.5*  --  9.4*  --  5.9 6.3  --   --   --   HGB 4.8*  < > 6.6*  6.6*  < > 7.8* 8.7* 9.1* 9.0* 9.5*  HCT 14.8*  < > 20.5*  20.8*  < > 23.5* 26.0* 27.6* 28.1* 29.9*  MCV 92.5  < > 91.6  --  88.0 87.2  --  88.9 91.2  PLT 192  < > 178  --  192 242  --  280 359  < > = values in this interval not displayed. Basic Metabolic Panel:  Recent Labs Lab 03/05/16 1555  03/08/16 0541 03/09/16 0541 03/09/16 0826 03/09/16 1553 03/10/16 0510 03/11/16 0435  NA 127*  < > 131* 125*  --  126* 131* 131*  K 3.5  < > 3.1* 3.5  --  4.0 3.8 4.2  CL 92*  < > 103 95*  --  98* 98* 100*  CO2 23  < > 23 22  --  21* 25 24  GLUCOSE 223*  < > 134* 167*  --  292* 213* 248*  BUN 66*  < > 7 <5*  --  7 6 8   CREATININE 1.19  < > 0.57* 0.50*  --  0.72 0.66 0.63  CALCIUM 8.5*  < > 7.9* 8.4*  --  8.4* 8.8* 9.1  MG 2.3  --   --   --  1.8  --   --   --   < > = values in this interval not displayed. GFR: Estimated Creatinine Clearance: 123.5 mL/min (by C-G formula based on Cr of 0.63).   Liver Function Tests:  Recent Labs Lab 03/06/16 1857 03/07/16 1433 03/08/16 0541 03/10/16 0510 03/11/16 0435  AST 1188* 804* 423* 101* 67*  ALT 816* 780* 601* 322* 253*  ALKPHOS 68 70 74 95 103  BILITOT 1.4* 1.4* 1.4* 0.7 0.7  PROT 5.3* 5.2* 5.1* 5.8* 6.4*  ALBUMIN 2.6* 2.5* 2.5* 2.6* 2.8*   No results for input(s): LIPASE, AMYLASE in the last 168 hours.  Recent Labs Lab 03/06/16 1620  AMMONIA 13   Coagulation Profile:  Recent Labs Lab 03/06/16 0529 03/07/16 1433  INR 1.22 1.29   Cardiac Enzymes:  Recent Labs Lab 03/05/16 1555  CKTOTAL 511*  TROPONINI 0.07*   Sepsis Labs:  Recent Labs Lab 03/05/16 1555 03/05/16 1742  LATICACIDVEN 1.6 1.5    Recent Results (from the past 240 hour(s))  MRSA PCR Screening     Status: None   Collection Time: 03/07/16  9:28 AM  Result Value Ref Range Status   MRSA by PCR NEGATIVE NEGATIVE Final    Comment:        The GeneXpert MRSA Assay  (FDA approved for NASAL specimens only), is one component of a comprehensive MRSA colonization surveillance program. It is not intended to diagnose  MRSA infection nor to guide or monitor treatment for MRSA infections.   Culture, Urine     Status: Abnormal   Collection Time: 03/09/16  3:49 PM  Result Value Ref Range Status   Specimen Description URINE, CLEAN CATCH  Final   Special Requests NONE  Final   Culture (A)  Final    >=100,000 COLONIES/mL KLEBSIELLA PNEUMONIAE >=100,000 COLONIES/mL ESCHERICHIA COLI    Report Status 03/11/2016 FINAL  Final   Organism ID, Bacteria KLEBSIELLA PNEUMONIAE (A)  Final   Organism ID, Bacteria ESCHERICHIA COLI (A)  Final      Susceptibility   Escherichia coli - MIC*    AMPICILLIN >=32 RESISTANT Resistant     CEFAZOLIN <=4 SENSITIVE Sensitive     CEFTRIAXONE <=1 SENSITIVE Sensitive     CIPROFLOXACIN <=0.25 SENSITIVE Sensitive     GENTAMICIN <=1 SENSITIVE Sensitive     IMIPENEM <=0.25 SENSITIVE Sensitive     NITROFURANTOIN <=16 SENSITIVE Sensitive     TRIMETH/SULFA <=20 SENSITIVE Sensitive     AMPICILLIN/SULBACTAM >=32 RESISTANT Resistant     PIP/TAZO <=4 SENSITIVE Sensitive     * >=100,000 COLONIES/mL ESCHERICHIA COLI   Klebsiella pneumoniae - MIC*    AMPICILLIN >=32 RESISTANT Resistant     CEFAZOLIN <=4 SENSITIVE Sensitive     CEFTRIAXONE <=1 SENSITIVE Sensitive     CIPROFLOXACIN <=0.25 SENSITIVE Sensitive     GENTAMICIN <=1 SENSITIVE Sensitive     IMIPENEM <=0.25 SENSITIVE Sensitive     NITROFURANTOIN 32 SENSITIVE Sensitive     TRIMETH/SULFA <=20 SENSITIVE Sensitive     AMPICILLIN/SULBACTAM 16 INTERMEDIATE Intermediate     PIP/TAZO <=4 SENSITIVE Sensitive     * >=100,000 COLONIES/mL KLEBSIELLA PNEUMONIAE     Radiology Studies: No results found.   Scheduled Meds: . busPIRone  5 mg Oral BID  . cefTRIAXone (ROCEPHIN)  IV  1 g Intravenous Q24H  . folic acid  1 mg Oral Daily  . lipase/protease/amylase  2 capsule Oral TID WC  .  nicotine  21 mg Transdermal Daily  . pantoprazole  40 mg Oral Daily  . sodium chloride flush  3 mL Intravenous Q12H  . thiamine  100 mg Oral Daily  . tiotropium  18 mcg Inhalation Daily   Continuous Infusions:    LOS: 6 days    Time spent: 35 minutes    Vassie LollMadera, Niyanna Asch, MD [facility] Pager [phone]  If 7PM-7AM, please contact night-coverage [email or web address] Password [email or web address] 03/11/2016, 12:22 PM

## 2016-03-11 NOTE — Care Management Note (Signed)
Case Management Note  Patient Details  Name: Brandon [name] MRN: [id number] Date of Birth: October 23, 1965  Subjective/Objective:       Stroke, GI bleed             Action/Plan: Discharge Planning: See previous NCM notes  NCM spoke to pt and states he is interested in rehab. Consult for IP rehab, contacted IP rehab coordinator to follow up on referral. Pt states his girlfriend, [name] and sister, [name] #[phone] is at home to assist with his care. Possible dc 03/12/2016 to IP rehab.   PCP Adrian BlackwaterKHAN, SHAUKAT A MD   Expected Discharge Date:  03/12/2016               Expected Discharge Plan:  IP Rehab Facility  In-House Referral:  Clinical Social Work  Discharge planning Services  CM Consult  Post Acute Care Choice:  IP Rehab Choice offered to:  NA  DME Arranged:  N/A DME Agency:  NA  HH Arranged:  NA HH Agency:  NA  Status of Service:  Completed, signed off  Medicare Important Message Given:    Date Medicare IM Given:    Medicare IM give by:    Date Additional Medicare IM Given:    Additional Medicare Important Message give by:     If discussed at Long Length of Stay Meetings, dates discussed:    Additional Comments:  Elliot CousinShavis, Ules Marsala Ellen, RN 03/11/2016, 2:24 PM

## 2016-03-11 NOTE — Progress Notes (Addendum)
Inpatient Diabetes Program Recommendations  AACE/ADA: New Consensus Statement on Inpatient Glycemic Control (2015)  Target Ranges:  Prepandial:   less than 140 mg/dL      Peak postprandial:   less than 180 mg/dL (1-2 hours)      Critically ill patients:  140 - 180 mg/dL   Results for Marcelle SmilingMARSHALL, Natasha W (MRN [id number]) as of 03/11/2016 12:44  Ref. Range 03/09/2016 05:41 03/09/2016 15:53 03/10/2016 05:10 03/11/2016 04:35  Glucose Latest Ref Range: 65-99 mg/dL 657167 (H) 846292 (H) 962213 (H) 248 (H)    Admit with: CVA/ GIB  History: CVA, ETOH    Elevated lab glucose levels X 3 days now   MD- Please consider placing order for Novolog Sensitive Correction Scale/ SSI (0-9 units) TID AC + HS      --Will follow patient during hospitalization--  Ambrose FinlandJeannine Johnston Romilda Proby RN, MSN, CDE Diabetes Coordinator Inpatient Glycemic Control Team Team Pager: [phone] (8a-5p)

## 2016-03-11 NOTE — Consult Note (Signed)
Physical Medicine and Rehabilitation Consult Reason for Consult: Acute infarcts scattered in the right MCA territory Referring Physician: [facility]   HPI: Brandon [name] is a 50 y.o. 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 Patient lives with sister. Reportedly independent prior to admission. Sister does work during the day. Plans to stay with his girlfriend in [place] who also works part time. Presented 03/05/2016 with altered mental status and easily fatigued. He was found by a friend lying on the couch confused covered in feces and urine. MRI of the brain showed acute infarcts scattered in the right middle cerebral artery territory. CT angiogram of head and neck showed chronic occlusion right ICA. Left carotid stent was patent. Occlusion proximal right vertebral artery. Echocardiogram with ejection fraction of 55-60% no wall motion abnormalities. Patient did not receive TPA. Ultrasound the abdomen showed no focal lesions or ascites. 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 Findings of hemoglobin 4.8 and transfused. Underwent upper GI endoscopy 03/08/2016 per Dr. Adela Lank with findings of large gastric varices without bleeding. Currently on no anticoagulation due to GI bleed. Full liquid diet. Urine culture greater than 100,000 gram-negative rods maintained on Rocephin. Hyponatremia 125-131 and monitored. Physical therapy evaluation completed 03/09/2016 with recommendations of physical medicine rehabilitation consult.  Review of Systems  Constitutional: Negative for fever and chills.  HENT: Negative for hearing loss.   Eyes: Negative for blurred vision and double vision.  Respiratory: Positive for cough.    Shortness of breath with heavy exertion  Cardiovascular: Negative for chest pain.  Gastrointestinal: Positive for nausea and constipation. Negative for vomiting.       GERD  Skin: Negative for rash.  Neurological: Positive for weakness and headaches. Negative for seizures.  Psychiatric/Behavioral: Positive for depression.       Anxiety  All other systems reviewed and are negative.  Past Medical History  Diagnosis Date  . Alcohol dependency (HCC)     Hx ETOH withdrawal seizures before 2011  . Pancreatitis     CT findings in May 2011 with inflammation and pseudocyst  . GERD (gastroesophageal reflux disease)   . CAD (coronary artery disease) 06/13/2012    Calcification noted on CTA of chest in 2012 Wall motion abnormality on ECHO    . 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 Hyperlipidemia   . Headache(784.0)     migraine  . Heart disease 02/2015    PCI/DES placed to RCA: on chronic Plavix/ASA  . Depression with anxiety   . Hypertension   . Carotid artery disease (HCC) 2016    bilateral.  s/p left carotid stent 03/2015   Past Surgical History  Procedure Laterality Date  . None    . Carotid angiogram N/A 06/15/2012    Procedure: CAROTID ANGIOGRAM;  Surgeon: Chuck Hint, MD;  Location: [facility] CATH LAB;  Service: Cardiovascular;  Laterality: N/A;  . Cardiac catheterization N/A 03/15/2015    Procedure: Left Heart Cath;  Surgeon: Laurier Nancy, MD;  Location: [facility] INVASIVE CV LAB;  Service: Cardiovascular;  Laterality: N/A;  . Cardiac catheterization N/A 03/16/2015    Procedure: Coronary Stent Intervention;  Surgeon: Alwyn Pea, MD;  Location: [facility] INVASIVE CV LAB;  Service: Cardiovascular;  Laterality: N/A;  . Peripheral vascular catheterization Left 04/06/2015    Procedure: Carotid PTA/Stent Intervention;  Surgeon: Annice Needy, MD;  Location: [facility] INVASIVE CV LAB;  Service: Cardiovascular;  Laterality: Left;   Family History    Problem Relation Age of Onset  . Stroke Mother     deceased  . Coronary artery disease Mother   . Alcohol abuse Mother   . Cancer Mother   . Hypertension Father     alive  . Alcohol abuse Father   . Diabetes Father   . Kidney disease Father    Social History:  reports that he has been smoking Cigarettes.  He has a 30 pack-year smoking history. He has never used smokeless tobacco. He reports that he drinks about 3.6 oz of alcohol per week. He reports that he does not use illicit drugs. Allergies:  Allergies  Allergen Reactions  . No Known Allergies    Medications Prior to Admission  Medication Sig Dispense Refill  . albuterol (PROAIR HFA) 108 (90 Base) MCG/ACT inhaler Inhale 2 puffs into the lungs every 6 (six) hours as needed for wheezing or shortness of breath.     Marland Kitchen albuterol (PROVENTIL) (2.5 MG/3ML) 0.083% nebulizer solution Take 2.5 mg by nebulization as needed for wheezing or shortness of breath.    Marland Kitchen amLODipine (NORVASC) 10 MG tablet Take 1 tablet (10 mg total) by mouth once. (Patient taking differently: Take 10 mg by mouth daily. ) 30 tablet 2  . aspirin 81 MG tablet Take 81 mg by mouth every morning.     . B Complex Vitamins (VITAMIN-B COMPLEX PO) Take 1 tablet by mouth daily.     Marland Kitchen buPROPion (WELLBUTRIN SR) 150 MG 12 hr tablet Start 1 in the am for 3 days then 1 tab twice daily. 60 tablet 5  . busPIRone (BUSPAR) 10 MG tablet Take 10 mg by mouth 2 (two) times daily.    . clopidogrel (PLAVIX) 75 MG tablet Take 1 tablet (75 mg total) by mouth daily with breakfast. 30 tablet 2  . cyclobenzaprine (FLEXERIL) 10 MG tablet Take 1 tablet (10 mg total) by mouth 3 (three) times daily as needed for muscle spasms. 90 tablet 5  . folic acid (FOLVITE) 1 MG tablet Take 1 mg by mouth daily.      Marland Kitchen GARLIC OIL 1500 PO Take 1,500 mg by mouth daily.     . hydrochlorothiazide (MICROZIDE) 12.5 MG capsule Take 12.5 mg by mouth daily.    Marland Kitchen lisinopril (PRINIVIL,ZESTRIL) 10 MG tablet Take 10 mg by  mouth daily.    Marland Kitchen lubiprostone (AMITIZA) 24 MCG capsule Take 24 mcg by mouth as needed for constipation.    . metoprolol (LOPRESSOR) 50 MG tablet Take 50 mg by mouth daily.    . Multiple Vitamin (MULTIVITAMIN) tablet Take 1 tablet by mouth daily.    . Omega-3 Fatty Acids (FISH OIL PO) Take 1 capsule by mouth daily.    Marland Kitchen omeprazole (PRILOSEC) 20 MG capsule Take 20 mg by mouth daily.    . Pancrelipase, Lip-Prot-Amyl, (ZENPEP) 25000 UNITS CPEP Take 2 capsules by mouth 3 (three) times daily with meals.     . simvastatin (ZOCOR) 20 MG tablet Take 20 mg by mouth daily at 6 PM.     . tiotropium (SPIRIVA HANDIHALER) 18 MCG inhalation capsule Place 18 mcg into inhaler and inhale daily.     . traMADol (ULTRAM) 50 MG tablet Take 1-2 tablets (50-100 mg total) by mouth every 6 (six) hours  as needed. for pain 90 tablet 1  . traZODone (DESYREL) 150 MG tablet Take 1 tablet (150 mg total) by mouth at bedtime. 30 tablet 1    Home: Home Living Family/patient expects to be discharged to:: Private residence Living Arrangements: Other relatives (sister) Available Help at Discharge: Family, Available PRN/intermittently Type of Home: House Home Access: Stairs to enter Secretary/administrator of Steps: 5 Entrance Stairs-Rails: Right, Left Home Layout: Two level Alternate Level Stairs-Number of Steps: flight Bathroom Shower/Tub: Tub/shower unit Home Equipment: None  Lives With: Significant other, Other (Comment) (sister)  Functional History: Prior Function Level of Independence: Independent Functional Status:  Mobility: Bed Mobility Overal bed mobility: Needs Assistance Bed Mobility: Supine to Sit Supine to sit: +2 for physical assistance, Mod assist General bed mobility comments: Assist to bring LLE off bed and to elevate trunk into sitting Transfers Overall transfer level: Needs assistance Equipment used: 2 person hand held assist, Ambulation equipment used Transfer via Lift Equipment:  Stedy Transfers: Sit to/from Stand, Scientist, clinical (histocompatibility and immunogenetics) Transfers Sit to Stand: +2 physical assistance, Mod assist Squat pivot transfers: +2 physical assistance, Mod assist General transfer comment: Performed bed to chair with bilat forearm support. Pt unable to fully stand this way and squat pivoted to chair. From chair stood pt with [facility] and able to achieve full standing with lt knee blocked by shin pad.      ADL:    Cognition: Cognition Overall Cognitive Status: Impaired/Different from baseline Arousal/Alertness: Awake/alert Orientation Level: Oriented to person, Oriented to place, Oriented to time, Oriented to situation, Oriented X4 Attention: Sustained Sustained Attention: Impaired Sustained Attention Impairment: Verbal basic Memory: Impaired Memory Impairment: Storage deficit Awareness:  (intellectual awareness appears adequate) Behaviors: Impulsive Cognition Arousal/Alertness: Awake/alert Behavior During Therapy: Impulsive Overall Cognitive Status: Impaired/Different from baseline Area of Impairment: Orientation, Attention, Memory, Safety/judgement, Problem solving Orientation Level: Disoriented to, Situation Current Attention Level: Sustained Memory: Decreased recall of precautions, Decreased short-term memory Safety/Judgement: Decreased awareness of safety, Decreased awareness of deficits Problem Solving: Slow processing, Requires verbal cues, Requires tactile cues General Comments: Lt inattention/neglect  Blood pressure 141/90, pulse 85, temperature 98.4 F (36.9 C), temperature source Oral, resp. rate 12, height 6\' 1"  (1.854 m), weight 85.1 kg (187 lb 9.8 oz), SpO2 100 %. Physical Exam  Vitals reviewed. Constitutional: He appears well-developed and well-nourished.  HENT:  Head: Normocephalic and atraumatic.  Eyes: EOM are normal. Right eye exhibits no discharge. Left eye exhibits no discharge.  Neck: Normal range of motion. Neck supple. No thyromegaly present.   Cardiovascular: Normal rate and regular rhythm.   Respiratory: Effort normal and breath sounds normal. No respiratory distress.  GI: Soft. Bowel sounds are normal. He exhibits no distension.  Musculoskeletal: He exhibits no edema or tenderness.  Neurological: He is alert.  Mood is flat but appropriate.  He is able to provide his name, Age as well as place. Follows simple commands. DTRs symmetric Motor: Right upper extremity/right lower extremity: 5/5 proximal to distal Left upper extremity: 4/5 proximal to distal. Apraxia Left lower extremity: 3/5 proximal to distal. Apraxia.  Skin: Skin is warm and dry.  Psychiatric: He has a normal mood and affect. His behavior is normal.    Results for orders placed or performed during the hospital encounter of 03/05/16 (from the past 24 hour(s))  Comprehensive metabolic panel     Status: Abnormal   Collection Time: 03/11/16  4:35 AM  Result Value Ref Range   Sodium 131 (L) 135 - 145 mmol/L   Potassium 4.2  3.5 - 5.1 mmol/L   Chloride 100 (L) 101 - 111 mmol/L   CO2 24 22 - 32 mmol/L   Glucose, Bld 248 (H) 65 - 99 mg/dL   BUN 8 6 - 20 mg/dL   Creatinine, Ser 2.13 0.61 - 1.24 mg/dL   Calcium 9.1 8.9 - 08.6 mg/dL   Total Protein 6.4 (L) 6.5 - 8.1 g/dL   Albumin 2.8 (L) 3.5 - 5.0 g/dL   AST 67 (H) 15 - 41 U/L   ALT 253 (H) 17 - 63 U/L   Alkaline Phosphatase 103 38 - 126 U/L   Total Bilirubin 0.7 0.3 - 1.2 mg/dL   GFR calc non Af Amer >60 >60 mL/min   GFR calc Af Amer >60 >60 mL/min   Anion gap 7 5 - 15  CBC     Status: Abnormal   Collection Time: 03/11/16  4:35 AM  Result Value Ref Range   WBC 10.3 4.0 - 10.5 K/uL   RBC 3.28 (L) 4.22 - 5.81 MIL/uL   Hemoglobin 9.5 (L) 13.0 - 17.0 g/dL   HCT 57.8 (L) 46.9 - 62.9 %   MCV 91.2 78.0 - 100.0 fL   MCH 29.0 26.0 - 34.0 pg   MCHC 31.8 30.0 - 36.0 g/dL   RDW 52.8 (H) 41.3 - 24.4 %   Platelets 359 150 - 400 K/uL   No results found.  Assessment/Plan: Diagnosis: Acute infarcts scattered in  the right MCA territory Labs and images independently reviewed.  Records reviewed and summated above. Stroke: Continue secondary stroke prophylaxis and Risk Factor Modification listed below:   Antiplatelet therapy:   Blood Pressure Management:  Continue current medication with prn's with permisive HTN per primary team Statin Agent:   Diabetes management:   Tobacco abuse:   Left sided hemiparesis: fit for orthosis to prevent contractures (resting hand splint for day, wrist cock up splint at night, PRAFO, etc)  Motor recovery: Fluoxetine  1. Does the need for close, 24 hr/day medical supervision in concert with the patient's rehab needs make it unreasonable for this patient to be served in a less intensive setting? Yes  2. 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 Due to skin/wound care, disease management, medication administration and patient education, does the patient require 24 hr/day rehab nursing? Yes 4. Does the patient require coordinated care of a physician, rehab nurse, PT (1-2 hrs/day, 5 days/week) and OT (1-2 hrs/day, 5 days/week) to address physical and functional deficits in the context of the above medical diagnosis(es)? Yes Addressing deficits in the following areas: balance, endurance, locomotion, strength, transferring, toileting and psychosocial support 5. Can the patient actively  participate in an intensive therapy program of at least 3 hrs of therapy per day at least 5 days per week? Potentially 6. The potential for patient to make measurable gains while on inpatient rehab is excellent 7. Anticipated functional outcomes upon discharge from inpatient rehab are min assist  with PT, supervision and min assist with OT, independent and modified independent with SLP. 8. Estimated rehab  length of stay to reach the above functional goals is:18-22 days. 9. Does the patient have adequate social supports and living environment to accommodate these discharge functional goals? Potentially 10. Anticipated D/C setting: Home 11. Anticipated post D/C treatments: HH therapy and Home excercise program 12. Overall Rehab/Functional Prognosis: good and fair  RECOMMENDATIONS: This patient's condition is appropriate for continued rehabilitative care in the following setting: CIR once medically stable. Patient has agreed to participate in recommended program. Yes Note that insurance prior authorization may be required for reimbursement for recommended care.  Comment: Rehab Admissions Coordinator to follow up.  Maryla MorrowAnkit Patel, MD 03/11/2016

## 2016-03-11 NOTE — Progress Notes (Signed)
Button down shirt is only belonging found in patients room, transported with him to 5C06. Per fiance- all other belongings have already been taken home.

## 2016-03-11 NOTE — Clinical Social Work Note (Signed)
Clinical Social Work Assessment  Patient Details  Name: Brandon [name] MRN: [id number] Date of Birth: 06-09-1966  Date of referral:  03/11/16               Reason for consult:  Facility Placement, Discharge Planning                Permission sought to share information with:  Family Supports Permission granted to share information::  Yes, Verbal Permission Granted  Name::     [name]  Agency::     Relationship::  Girlfriend  Contact Information:  [phone]  Housing/Transportation Living arrangements for the past 2 months:  Single Family Home Source of Information:  Patient, Medical Team Patient Interpreter Needed:  None Criminal Activity/Legal Involvement Pertinent to Current Situation/Hospitalization:  No - Comment as needed Significant Relationships:  Significant Other, Siblings Lives with:  Siblings Do you feel safe going back to the place where you live?  Yes Need for family participation in patient care:  Yes (Comment)  Care giving concerns:  PT recommending CIR. Patient has not been seen by CIR MD yet.   Social Worker assessment / plan:  CSW met with patient. No supports at bedside. CSW introduced role and explained that discharge planning would be discussed. Discussed PT recommendation for CIR as well as SNF process. Discussed insurance issues: Medicaid will not pay for therapy at SNF, they will take his check, and he will have to be at SNF for at least 30 days for Medicaid to pay for room and board. Patient expressed understanding. He says that he is in the process of getting disability and would not be able to pay out of pocket for therapy without that money. CSW told patient that CSW will continue to monitor progress and determine course of action from there. Patient agreeable. No further concerns. CSW encouraged patient to contact CSW as needed. CSW will continue to follow patient and facilitate discharge to SNF once medically stable if needed.  Employment status:   Unemployed Forensic scientist:  Medicaid In [place] PT Recommendations:  Inpatient Rehab Consult Information / Referral to community resources:  [place]  Patient/Family's Response to care:  Patient agreeable to SNF placement as a backup to CIR only. He will be unable to pay for therapy out of pocket unless he gets disability. Patient's girlfriend and sister supportive and involved in patient's care. Patient appreciated social work intervention.  Patient/Family's Understanding of and Emotional Response to Diagnosis, Current Treatment, and Prognosis:  Patient understands need for rehab following discharge. He prefers CIR to SNF but is willing to go to SNF if he gets disability in time for that. Patient is unable to private pay otherwise.  Emotional Assessment Appearance:  Appears stated age Attitude/Demeanor/Rapport:   (Pleasant) Affect (typically observed):  Accepting, Appropriate, Calm, Pleasant Orientation:  Oriented to Self, Oriented to Place, Oriented to  Time, Oriented to Situation Alcohol / Substance use:  Tobacco Use, Alcohol Use Psych involvement (Current and /or in the community):  No (Comment)  Discharge Needs  Concerns to be addressed:  Care Coordination Readmission within the last 30 days:  No Current discharge risk:  Dependent with Mobility, Substance Abuse, Lack of support system Barriers to Discharge:  Inadequate or no insurance   Candie Chroman, LCSW 03/11/2016, 1:07 PM

## 2016-03-12 ENCOUNTER — Encounter (HOSPITAL_COMMUNITY): Payer: Self-pay | Admitting: *Deleted

## 2016-03-12 ENCOUNTER — Inpatient Hospital Stay (HOSPITAL_COMMUNITY)
Admission: RE | Admit: 2016-03-12 | Discharge: 2016-03-29 | DRG: 057 | Disposition: A | Discharge status: Home Health | Payer: Medicaid Other | Source: Intra-hospital | Attending: Physical Medicine & Rehabilitation | Admitting: Physical Medicine & Rehabilitation

## 2016-03-12 ENCOUNTER — Encounter (HOSPITAL_COMMUNITY): Payer: Self-pay | Admitting: General Surgery

## 2016-03-12 DIAGNOSIS — G4700 Insomnia, unspecified: Secondary | ICD-10-CM | POA: Diagnosis present

## 2016-03-12 DIAGNOSIS — Z7982 Long term (current) use of aspirin: Secondary | ICD-10-CM | POA: Diagnosis not present

## 2016-03-12 DIAGNOSIS — I10 Essential (primary) hypertension: Secondary | ICD-10-CM | POA: Diagnosis present

## 2016-03-12 DIAGNOSIS — Z955 Presence of coronary angioplasty implant and graft: Secondary | ICD-10-CM

## 2016-03-12 DIAGNOSIS — K861 Other chronic pancreatitis: Secondary | ICD-10-CM

## 2016-03-12 DIAGNOSIS — Z79899 Other long term (current) drug therapy: Secondary | ICD-10-CM

## 2016-03-12 DIAGNOSIS — Z833 Family history of diabetes mellitus: Secondary | ICD-10-CM

## 2016-03-12 DIAGNOSIS — I2510 Atherosclerotic heart disease of native coronary artery without angina pectoris: Secondary | ICD-10-CM | POA: Diagnosis present

## 2016-03-12 DIAGNOSIS — Z8249 Family history of ischemic heart disease and other diseases of the circulatory system: Secondary | ICD-10-CM | POA: Diagnosis not present

## 2016-03-12 DIAGNOSIS — F329 Major depressive disorder, single episode, unspecified: Secondary | ICD-10-CM | POA: Diagnosis present

## 2016-03-12 DIAGNOSIS — E871 Hypo-osmolality and hyponatremia: Secondary | ICD-10-CM | POA: Diagnosis present

## 2016-03-12 DIAGNOSIS — K5900 Constipation, unspecified: Secondary | ICD-10-CM | POA: Diagnosis present

## 2016-03-12 DIAGNOSIS — R4182 Altered mental status, unspecified: Secondary | ICD-10-CM | POA: Diagnosis present

## 2016-03-12 DIAGNOSIS — N390 Urinary tract infection, site not specified: Secondary | ICD-10-CM | POA: Diagnosis present

## 2016-03-12 DIAGNOSIS — D62 Acute posthemorrhagic anemia: Secondary | ICD-10-CM | POA: Diagnosis not present

## 2016-03-12 DIAGNOSIS — I259 Chronic ischemic heart disease, unspecified: Secondary | ICD-10-CM | POA: Diagnosis present

## 2016-03-12 DIAGNOSIS — K859 Acute pancreatitis without necrosis or infection, unspecified: Secondary | ICD-10-CM | POA: Diagnosis present

## 2016-03-12 DIAGNOSIS — E119 Type 2 diabetes mellitus without complications: Secondary | ICD-10-CM | POA: Diagnosis present

## 2016-03-12 DIAGNOSIS — E1165 Type 2 diabetes mellitus with hyperglycemia: Secondary | ICD-10-CM | POA: Diagnosis not present

## 2016-03-12 DIAGNOSIS — G479 Sleep disorder, unspecified: Secondary | ICD-10-CM | POA: Insufficient documentation

## 2016-03-12 DIAGNOSIS — I69354 Hemiplegia and hemiparesis following cerebral infarction affecting left non-dominant side: Secondary | ICD-10-CM

## 2016-03-12 DIAGNOSIS — F17210 Nicotine dependence, cigarettes, uncomplicated: Secondary | ICD-10-CM | POA: Diagnosis present

## 2016-03-12 DIAGNOSIS — H04123 Dry eye syndrome of bilateral lacrimal glands: Secondary | ICD-10-CM | POA: Insufficient documentation

## 2016-03-12 DIAGNOSIS — I63511 Cerebral infarction due to unspecified occlusion or stenosis of right middle cerebral artery: Secondary | ICD-10-CM | POA: Diagnosis not present

## 2016-03-12 DIAGNOSIS — Z794 Long term (current) use of insulin: Secondary | ICD-10-CM | POA: Diagnosis not present

## 2016-03-12 DIAGNOSIS — B9689 Other specified bacterial agents as the cause of diseases classified elsewhere: Secondary | ICD-10-CM | POA: Diagnosis present

## 2016-03-12 DIAGNOSIS — K922 Gastrointestinal hemorrhage, unspecified: Secondary | ICD-10-CM | POA: Diagnosis present

## 2016-03-12 DIAGNOSIS — E785 Hyperlipidemia, unspecified: Secondary | ICD-10-CM | POA: Diagnosis present

## 2016-03-12 DIAGNOSIS — I69351 Hemiplegia and hemiparesis following cerebral infarction affecting right dominant side: Principal | ICD-10-CM

## 2016-03-12 LAB — GLUCOSE, CAPILLARY
GLUCOSE-CAPILLARY: 199 mg/dL — AB (ref 65–99)
GLUCOSE-CAPILLARY: 226 mg/dL — AB (ref 65–99)
GLUCOSE-CAPILLARY: 262 mg/dL — AB (ref 65–99)
Glucose-Capillary: 180 mg/dL — ABNORMAL HIGH (ref 65–99)

## 2016-03-12 LAB — COMPREHENSIVE METABOLIC PANEL
ALK PHOS: 100 U/L (ref 38–126)
ALT: 186 U/L — AB (ref 17–63)
ANION GAP: 7 (ref 5–15)
AST: 52 U/L — ABNORMAL HIGH (ref 15–41)
Albumin: 2.6 g/dL — ABNORMAL LOW (ref 3.5–5.0)
BUN: 7 mg/dL (ref 6–20)
CALCIUM: 8.9 mg/dL (ref 8.9–10.3)
CO2: 24 mmol/L (ref 22–32)
CREATININE: 0.63 mg/dL (ref 0.61–1.24)
Chloride: 99 mmol/L — ABNORMAL LOW (ref 101–111)
Glucose, Bld: 201 mg/dL — ABNORMAL HIGH (ref 65–99)
Potassium: 3.8 mmol/L (ref 3.5–5.1)
Sodium: 130 mmol/L — ABNORMAL LOW (ref 135–145)
Total Bilirubin: 0.5 mg/dL (ref 0.3–1.2)
Total Protein: 5.9 g/dL — ABNORMAL LOW (ref 6.5–8.1)

## 2016-03-12 LAB — HEMOGLOBIN A1C
HEMOGLOBIN A1C: 5.6 % (ref 4.8–5.6)
Mean Plasma Glucose: 114 mg/dL

## 2016-03-12 LAB — CBC
HCT: 25.9 % — ABNORMAL LOW (ref 39.0–52.0)
Hemoglobin: 8.2 g/dL — ABNORMAL LOW (ref 13.0–17.0)
MCH: 28.9 pg (ref 26.0–34.0)
MCHC: 31.7 g/dL (ref 30.0–36.0)
MCV: 91.2 fL (ref 78.0–100.0)
PLATELETS: 403 10*3/uL — AB (ref 150–400)
RBC: 2.84 MIL/uL — AB (ref 4.22–5.81)
RDW: 17.5 % — ABNORMAL HIGH (ref 11.5–15.5)
WBC: 10.1 10*3/uL (ref 4.0–10.5)

## 2016-03-12 MED ORDER — INSULIN ASPART 100 UNIT/ML ~~LOC~~ SOLN
0.0000 [IU] | Freq: Three times a day (TID) | SUBCUTANEOUS | Status: DC
  Administered 2016-03-13: 3 [IU] via SUBCUTANEOUS
  Administered 2016-03-13: 2 [IU] via SUBCUTANEOUS
  Administered 2016-03-13: 3 [IU] via SUBCUTANEOUS
  Administered 2016-03-14 (×2): 1 [IU] via SUBCUTANEOUS
  Administered 2016-03-15: 2 [IU] via SUBCUTANEOUS
  Administered 2016-03-15: 1 [IU] via SUBCUTANEOUS
  Administered 2016-03-16: 2 [IU] via SUBCUTANEOUS
  Administered 2016-03-16: 1 [IU] via SUBCUTANEOUS
  Administered 2016-03-17 (×2): 2 [IU] via SUBCUTANEOUS
  Administered 2016-03-18: 3 [IU] via SUBCUTANEOUS
  Administered 2016-03-18: 2 [IU] via SUBCUTANEOUS
  Administered 2016-03-19 – 2016-03-20 (×5): 1 [IU] via SUBCUTANEOUS
  Administered 2016-03-20: 3 [IU] via SUBCUTANEOUS
  Administered 2016-03-21: 1 [IU] via SUBCUTANEOUS
  Administered 2016-03-21: 5 [IU] via SUBCUTANEOUS
  Administered 2016-03-21: 3 [IU] via SUBCUTANEOUS
  Administered 2016-03-22: 1 [IU] via SUBCUTANEOUS
  Administered 2016-03-22: 2 [IU] via SUBCUTANEOUS
  Administered 2016-03-22 – 2016-03-23 (×2): 3 [IU] via SUBCUTANEOUS
  Administered 2016-03-23: 2 [IU] via SUBCUTANEOUS
  Administered 2016-03-23: 1 [IU] via SUBCUTANEOUS
  Administered 2016-03-24: 2 [IU] via SUBCUTANEOUS
  Administered 2016-03-24 – 2016-03-27 (×7): 1 [IU] via SUBCUTANEOUS
  Administered 2016-03-27: 2 [IU] via SUBCUTANEOUS
  Administered 2016-03-28 – 2016-03-29 (×3): 1 [IU] via SUBCUTANEOUS

## 2016-03-12 MED ORDER — NICOTINE 21 MG/24HR TD PT24
21.0000 mg | MEDICATED_PATCH | Freq: Every day | TRANSDERMAL | Status: DC
  Administered 2016-03-13 – 2016-03-29 (×17): 21 mg via TRANSDERMAL
  Filled 2016-03-12 (×17): qty 1

## 2016-03-12 MED ORDER — NADOLOL 40 MG PO TABS
40.0000 mg | ORAL_TABLET | Freq: Every day | ORAL | Status: DC
  Administered 2016-03-12: 40 mg via ORAL
  Filled 2016-03-12: qty 1

## 2016-03-12 MED ORDER — ASPIRIN 81 MG PO CHEW
81.0000 mg | CHEWABLE_TABLET | Freq: Every day | ORAL | Status: DC
  Administered 2016-03-12 – 2016-03-29 (×18): 81 mg via ORAL
  Filled 2016-03-12 (×18): qty 1

## 2016-03-12 MED ORDER — SORBITOL 70 % SOLN
30.0000 mL | Freq: Every day | Status: DC | PRN
  Administered 2016-03-13: 30 mL via ORAL
  Filled 2016-03-12: qty 30

## 2016-03-12 MED ORDER — GLUCERNA SHAKE PO LIQD
237.0000 mL | Freq: Two times a day (BID) | ORAL | Status: DC
  Filled 2016-03-12 (×2): qty 237

## 2016-03-12 MED ORDER — BUSPIRONE HCL 5 MG PO TABS: 5.0000 mg | ORAL_TABLET | Freq: Two times a day (BID) | ORAL | Status: DC

## 2016-03-12 MED ORDER — ONDANSETRON HCL 4 MG/2ML IJ SOLN: 4.0000 mg | Freq: Four times a day (QID) | INTRAMUSCULAR | Status: DC | PRN

## 2016-03-12 MED ORDER — INSULIN ASPART 100 UNIT/ML ~~LOC~~ SOLN: 0.0000 [IU] | Freq: Three times a day (TID) | SUBCUTANEOUS | Status: DC

## 2016-03-12 MED ORDER — CEFUROXIME AXETIL 500 MG PO TABS
500.0000 mg | ORAL_TABLET | Freq: Two times a day (BID) | ORAL | Status: DC
  Administered 2016-03-12 – 2016-03-17 (×11): 500 mg via ORAL
  Filled 2016-03-12 (×12): qty 1

## 2016-03-12 MED ORDER — INSULIN GLARGINE 100 UNIT/ML ~~LOC~~ SOLN
5.0000 [IU] | Freq: Every day | SUBCUTANEOUS | Status: DC
  Administered 2016-03-12: 5 [IU] via SUBCUTANEOUS
  Filled 2016-03-12: qty 0.05

## 2016-03-12 MED ORDER — PANTOPRAZOLE SODIUM 40 MG PO TBEC: 40.0000 mg | DELAYED_RELEASE_TABLET | Freq: Every day | ORAL | Status: DC

## 2016-03-12 MED ORDER — NICOTINE 21 MG/24HR TD PT24: 21.0000 mg | MEDICATED_PATCH | Freq: Every day | TRANSDERMAL | Status: DC

## 2016-03-12 MED ORDER — BUSPIRONE HCL 5 MG PO TABS
5.0000 mg | ORAL_TABLET | Freq: Two times a day (BID) | ORAL | Status: DC
  Administered 2016-03-12 – 2016-03-29 (×34): 5 mg via ORAL
  Filled 2016-03-12 (×35): qty 1

## 2016-03-12 MED ORDER — CEFUROXIME AXETIL 500 MG PO TABS: 500.0000 mg | ORAL_TABLET | Freq: Two times a day (BID) | ORAL | Status: DC

## 2016-03-12 MED ORDER — NADOLOL 40 MG PO TABS
40.0000 mg | ORAL_TABLET | Freq: Every day | ORAL | Status: DC
  Administered 2016-03-13 – 2016-03-29 (×17): 40 mg via ORAL
  Filled 2016-03-12 (×17): qty 1

## 2016-03-12 MED ORDER — TIOTROPIUM BROMIDE MONOHYDRATE 18 MCG IN CAPS
18.0000 ug | ORAL_CAPSULE | Freq: Every day | RESPIRATORY_TRACT | Status: DC
  Administered 2016-03-13 – 2016-03-29 (×13): 18 ug via RESPIRATORY_TRACT
  Filled 2016-03-12 (×3): qty 5

## 2016-03-12 MED ORDER — INSULIN GLARGINE 100 UNIT/ML ~~LOC~~ SOLN: 5.0000 [IU] | Freq: Every day | SUBCUTANEOUS | Status: DC

## 2016-03-12 MED ORDER — TRAMADOL HCL 50 MG PO TABS: 50.0000 mg | ORAL_TABLET | Freq: Three times a day (TID) | ORAL | Status: DC | PRN

## 2016-03-12 MED ORDER — INSULIN GLARGINE 100 UNIT/ML ~~LOC~~ SOLN
5.0000 [IU] | Freq: Every day | SUBCUTANEOUS | Status: DC
Start: 2016-03-13 — End: 2016-03-18
  Administered 2016-03-13 – 2016-03-18 (×6): 5 [IU] via SUBCUTANEOUS
  Filled 2016-03-12 (×6): qty 0.05

## 2016-03-12 MED ORDER — FOLIC ACID 1 MG PO TABS
1.0000 mg | ORAL_TABLET | Freq: Every day | ORAL | Status: DC
  Administered 2016-03-13 – 2016-03-29 (×17): 1 mg via ORAL
  Filled 2016-03-12 (×17): qty 1

## 2016-03-12 MED ORDER — VITAMIN B-1 100 MG PO TABS
100.0000 mg | ORAL_TABLET | Freq: Every day | ORAL | Status: DC
  Administered 2016-03-13 – 2016-03-29 (×17): 100 mg via ORAL
  Filled 2016-03-12 (×17): qty 1

## 2016-03-12 MED ORDER — OMEPRAZOLE 20 MG PO CPDR: 40.0000 mg | DELAYED_RELEASE_CAPSULE | Freq: Every day | ORAL | Status: DC

## 2016-03-12 MED ORDER — ALBUTEROL SULFATE (2.5 MG/3ML) 0.083% IN NEBU: 3.0000 mL | INHALATION_SOLUTION | Freq: Four times a day (QID) | RESPIRATORY_TRACT | Status: DC | PRN

## 2016-03-12 MED ORDER — ONDANSETRON HCL 4 MG PO TABS
4.0000 mg | ORAL_TABLET | Freq: Four times a day (QID) | ORAL | Status: DC | PRN
  Administered 2016-03-12: 4 mg via ORAL
  Filled 2016-03-12: qty 1

## 2016-03-12 MED ORDER — PANTOPRAZOLE SODIUM 40 MG PO TBEC
40.0000 mg | DELAYED_RELEASE_TABLET | Freq: Every day | ORAL | Status: DC
  Administered 2016-03-13 – 2016-03-29 (×17): 40 mg via ORAL
  Filled 2016-03-12 (×17): qty 1

## 2016-03-12 MED ORDER — PANCRELIPASE (LIP-PROT-AMYL) 12000-38000 UNITS PO CPEP
2.0000 | ORAL_CAPSULE | Freq: Three times a day (TID) | ORAL | Status: DC
  Administered 2016-03-13 – 2016-03-29 (×49): 24000 [IU] via ORAL
  Filled 2016-03-12 (×51): qty 2

## 2016-03-12 MED ORDER — NADOLOL 40 MG PO TABS: 40.0000 mg | ORAL_TABLET | Freq: Every day | ORAL | Status: DC

## 2016-03-12 NOTE — Discharge Summary (Signed)
Physician Discharge Summary  Brandon [name] ZOX:[id number] DOB: 1965-11-06 DOA: 03/05/2016  PCP: Laurier Nancy, MD  Admit date: 03/05/2016 Discharge date: 03/12/2016  Time spent: 35 minutes  Recommendations for Outpatient Follow-up:  Repeat CBC intermittently to follow Hgb trend Repeat BMET intermittently to follow electrolytes and renal function   Discharge Diagnoses:  Principal Problem:   UGIB (upper gastrointestinal bleed) Active Problems:   Hypertension   GERD (gastroesophageal reflux disease)   Chronic Pancreatitis.   Ischemic Stroke   Alcohol Dependence   CAD in native artery   CVA (cerebral infarction)   Pressure ulcer   Acute encephalopathy   Left-sided neglect   Severe anemia   Gastric varices   Alcohol abuse   Hyponatremia   Splenic vein thrombosis   Pancreatic pseudocyst   Acute blood loss anemia   Fatty liver   Tobacco abuse   Diabetes mellitus type 2 in nonobese (HCC)   History of CVA with residual deficit   Acute lower UTI   Tachycardia   Chronic alcoholic pancreatitis [facility])   Discharge Condition: stable and improved. Discharge to CIR for further rehab and care.  Diet recommendation: modified carbohydrates and heart healthy   Filed Weights   03/09/16 0300 03/10/16 0324 03/11/16 0308  Weight: 88.1 kg (194 lb 3.6 oz) 85.6 kg (188 lb 11.4 oz) 85.1 kg (187 lb 9.8 oz)    History of present illness:  50 y.o. male with medical history significant of ischemic heart disease and CVA. Patient had a stress test about 4 days ago, since then he has been not feeling well mainly dyspnea and easy fatigability. He complained to his friend about his ongoing symptoms over the phone over last 3 days. 24 hours ago he was found laying in his couch confused and disoriented, he was covered in feces and urine, he was noted to be weak on the left side and he was brought into the hospital for evaluation. All information is obtained from his friend at bedside. Patient is confused  unable to give detailed history.   Patient has had a CVA in 2013, back then he had a left sided weakness, he received TPA with improvement of his symptoms. He is not to have carotid artery stenosis completely occluded on the left and status post stents on the right. Patient does have coronary stents as well. He is currently taking dual antiplatelet therapy with aspirin and Plavix.   Hospital Course:  #1 upper GI bleed/large gastric varices/splenic vein thrombosis -Questionable etiology. Likely secondary to a gastric variceal bleed versus splenic vein thrombosis. Patient on admission had a hemoglobin of 4.8. Status post 4 units packed red blood cells hemoglobin currently at 9.0 -Patient with no further bleeding appreciated at this time.  -Patient has been seen in consultation by gastroenterology and underwent upper endoscopy which showed large gastric varices without bleeding.  -CT angiogram abdomen and pelvis with BRTO protocol with splenic vein thrombosis and large pancreatic pseudocyst which could likely be hemorrhagic.  -Patient likely will ultimately require splenectomy however a poor surgical candidate at this time; will follow general surgery final rec's.  -Continue intermittent check of Hgb trend -Continue PPI once a day and ok to resume low dose aspirin -Naldolol added to help controlling portal HTN -If patient re-bleed will need bleeding scan and embolization by IR.  #2 acute blood loss anemia -Secondary to problem #1. Status post 4 units packed red blood cells.  -Hemoglobin was 4.8 on admission.  -Currently 8.2 at discharge  -Follow H&H  trend.  #3 acute right brain stroke -Patient with left hemiparesis which is slowly improving. Tpa not administered secondary to delay in arrival as well as acute bleed.  -MRI with acute infarct in the right MCA territory. 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  -Left ICA stent patent.  -2-D echo  with a normal EF, no wall portion abnormalities and no source of acute emboli.  -EEG was normal.  -Hemoglobin A1c pending (last one on records 5.6).  -PT/OT recommending CIR -ok to resume aspirin for secondary prevention as per GI rec's  #4 hyponatremia -Likely secondary to hypervolemic hyponatremia and chronic alcohol use.  -Patient was transfused 4 units packed red blood cells during this hospitalization as well as hydrated with IV fluids due to acute acute GI bleed.  -Follow electrolytes trend with intermittent BMT.  #5 hyperlipidemia -Fasting lipid panel with LDL of 37.  -Goal is less than 70.  -Resume statin when able to, at discharge.  #6 hypertension -will resume lisinopril and naldolol now for BP control -advise to quit drinking alcohol and to follow heart healthy diet   #7 tobacco abuse -Tobacco cessation provided.  -Continue Nicotine patch.  #8 history of alcohol abuse/alcohol dependence -Continue buspar at adjusted dose -Continue folic acid and thiamine   #9 transaminitis -Questionable etiology. Likely secondary to alcohol abuse vs secondary to shock liver. Acute hepatitis panel negative. -LFTs continue trending down/almost at baseline .  -Follow trend .  #10 acute encephalopathy -Likely secondary to acute GI bleed and CVA. -Concerns for alcohol withdrawal given heavy drinking status  -Improving/stable and with mentation at baseline currently -advise to quit drinking  -CIWA used while inpatient; no withdrawal appreciated   #11 chronic pancreatitis with pancreatic pseudocyst -Likely secondary to chronic alcohol use. -Continue Pancrease. -advise to quit drinking   #12 E. Coli and Klebsiella UTI -continue treatment with ceftin now -no fever and no dysuria    #13 hyperglycemia -will continue SSI and low dose lantus for now -evaluate needs for further hypoglycemic regimen at discharge -could be associated to chronic pancreatitis, requiring insulin  at discharge  Procedures:  CT head without contrast 03/05/2016  CT angiogram head and neck 03/06/2016  CT angiogram abdomen and pelvis BRTO protocol 03/08/2016  Abdominal ultrasound 03/07/2016  2-D echo 03/07/2016  MRI brain 03/06/2016  Upper endoscopy 03/08/2016 per Dr. Adela LankArmbruster  4 units packed red blood cells  EEG  Consultations:  Neurology  GI  General surgery   IR  CIR  Discharge Exam: Filed Vitals:   03/12/16 0614 03/12/16 1014  BP: 136/82 114/75  Pulse: 90 93  Temp: 98.6 F (37 C) 99.2 F (37.3 C)  Resp: 18 18   General exam: Appears calm and comfortable; no CP, no SOB. Reports some black stools on 6/17; none since. Respiratory system: Clear to auscultation. Respiratory effort normal. Cardiovascular system: S1 & S2 heard, RRR. No JVD, murmurs, rubs, gallops or clicks. No pedal edema. Gastrointestinal system: Abdomen is nondistended, soft and nontender. No organomegaly or masses felt. Normal bowel sounds heard. Central nervous system: Alert and oriented. Slight left facial droop. No dysarthria. Left-sided weakness.  Extremities: 5/5 Right lower extremity and right upper extremity strength. 3-4/5 left lower extremity and left upper extremity strength. Skin: No rashes, lesions or ulcers Psychiatry: Judgement and insight appear normal. Mood & affect appropriate.    Discharge Instructions   Discharge Instructions    Diet - low sodium heart healthy    Complete by:  As directed           Current Discharge Medication List    START taking these medications   Details  cefUROXime (CEFTIN) 500 MG tablet Take 1 tablet (500 mg total) by mouth 2 (two) times daily with a meal.    insulin aspart (NOVOLOG) 100 UNIT/ML injection Inject 0-9 Units into the skin 3 (three) times daily with meals.    insulin glargine (LANTUS) 100 UNIT/ML injection Inject 0.05 mLs (5 Units total) into the skin daily.    nadolol (CORGARD) 40 MG tablet Take 1 tablet (40 mg  total) by mouth daily. Qty: 30 tablet, Refills: 1    nicotine (NICODERM CQ - DOSED IN MG/24 HOURS) 21 mg/24hr patch Place 1 patch (21 mg total) onto the skin daily. Qty: 28 patch, Refills: 0      CONTINUE these medications which have CHANGED   Details  busPIRone (BUSPAR) 5 MG tablet Take 1 tablet (5 mg total) by mouth 2 (two) times daily.    omeprazole (PRILOSEC) 20 MG capsule Take 2 capsules (40 mg total) by mouth daily. Qty: 60 capsule, Refills: 1    traMADol (ULTRAM) 50 MG tablet Take 1 tablet (50 mg total) by mouth every 8 (eight) hours as needed for severe pain. for pain      CONTINUE these medications which have NOT CHANGED   Details  albuterol (PROAIR HFA) 108 (90 Base) MCG/ACT inhaler Inhale 2 puffs into the lungs every 6 (six) hours as needed for wheezing or shortness of breath.     albuterol (PROVENTIL) (2.5 MG/3ML) 0.083% nebulizer solution Take 2.5 mg by nebulization as needed for wheezing or shortness of breath.    aspirin 81 MG tablet Take 81 mg by mouth every morning.     B Complex Vitamins (VITAMIN-B COMPLEX PO) Take 1 tablet by mouth daily.     folic acid (FOLVITE) 1 MG tablet Take 1 mg by mouth daily.      GARLIC OIL 1500 PO Take 1,500 mg by mouth daily.     lisinopril (PRINIVIL,ZESTRIL) 10 MG tablet Take 10 mg by mouth daily.    lubiprostone (AMITIZA) 24 MCG capsule Take 24 mcg by mouth as needed for constipation.    Multiple Vitamin (MULTIVITAMIN) tablet Take 1 tablet by mouth daily.    Omega-3 Fatty Acids (FISH OIL PO) Take 1 capsule by mouth daily.    Pancrelipase, Lip-Prot-Amyl, (ZENPEP) 25000 UNITS CPEP Take 2 capsules by mouth 3 (three) times daily with meals.     simvastatin (ZOCOR) 20 MG tablet Take 20 mg by mouth daily at 6 PM.     tiotropium (SPIRIVA HANDIHALER) 18 MCG inhalation capsule Place 18 mcg into inhaler and inhale daily.     traZODone (DESYREL) 150 MG tablet Take 1 tablet (150 mg total) by mouth at bedtime. Qty: 30 tablet, Refills:  1   Associated Diagnoses: Insomnia      STOP taking these medications     amLODipine (NORVASC) 10 MG tablet      buPROPion (WELLBUTRIN SR) 150 MG 12 hr tablet      clopidogrel (PLAVIX) 75 MG tablet      cyclobenzaprine (FLEXERIL) 10 MG tablet      hydrochlorothiazide (MICROZIDE) 12.5 MG capsule      metoprolol (LOPRESSOR) 50 MG tablet        Allergies  Allergen Reactions  . No Known Allergies     The results of significant diagnostics from this hospitalization (including imaging, microbiology, ancillary and laboratory) are  listed below for reference.    Significant Diagnostic Studies: Ct Abdomen Pelvis W Wo Contrast  03/08/2016  CLINICAL DATA:  History of ETOH, pancreatitis, and pancreatic pseudocyst and pseudoaneurysm. 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 *BRTO protocol used per Dr. Loreta Ave EXAM: CT ANGIOGRAPHY ABDOMEN AND PELVIS TECHNIQUE: Multidetector CT imaging of the abdomen and pelvis was performed using the standard protocol during bolus administration of intravenous contrast. Multiplanar reconstructed images including MIPs were obtained and reviewed to evaluate the vascular anatomy. CONTRAST:  ISOVUE-300 IOPAMIDOL (ISOVUE-300) INJECTION 61% COMPARISON:  COMPARISON 02/16/2014 FINDINGS: ARTERIAL Aorta: Mild plaque in the infrarenal segment. No aneurysm, dissection, or stenosis. Celiac axis:          Patent. Superior mesenteric: Mild ostial plaque without stenosis. Classic distal branch anatomy. Left renal: Ostial plaque extending over length of approximately 2 cm resulting in at least moderate short segment stenosis, patent distally. Right renal: Ostial plaque extending over length of at least 13 mm, resulting in short segment moderately severe stenosis, patent distally. Inferior mesenteric:  Mild ostial stenosis, patent distally. Left iliac: Scattered plaque. No aneurysm, dissection, or stenosis. Right iliac: Minimal plaque. No aneurysm, dissection, or stenosis.  VENOUS Patent hepatic veins, portal vein, SMV, and IMV. I do not identify a contiguous patent splenic vein. Early opacification of small varices in the gastric fundus, with larger collateral venous channels around the gastric body and antrum draining toward the portal vein. No significant retroperitoneal venous collateral drainage network nor any large draining into the left renal vein. Patent bilateral renal veins, IVC, and iliac venous system. Review of the MIP images confirms the above findings. NONVASCULAR Lower chest: Trace left pleural effusion. Adjacent subsegmental atelectasis/ infiltrate posteriorly in the visualized left lower lobe. Coronary calcifications. Hepatobiliary: No masses or other significant abnormality. Gallbladder incompletely distended. Pancreas: Scattered coarse calcifications within the pancreatic head, body and tail consistent with chronic pancreatitis. The enhancing lesion seen adjacent to the tail of the pancreas on the previous study suggestive of pseudoaneurysm is not seen. There is a mixed attenuation left subphrenic pseudocyst lateral to the spleen abutting the gastric fundus and extending down to the pancreatic tail, measuring 9.9 AP x 6.9 transverse x 8.6 cm craniocaudal, and is new since prior study. There is high attenuation within the anterior aspect of the collection suggesting previous hemorrhage. Near complete resolution of the small pseudocyst seen posterior to the portosplenic confluence on the prior study, and resolution of the loculated fluid anterolateral to the stomach seen on prior exam. Spleen: Within normal limits in size and appearance. Adrenals/Urinary Tract: Bilateral adrenal hypertrophy left greater than right. Kidneys unremarkable. No hydronephrosis or nephrolithiasis. Urinary bladder is physiologically distended and there is diffuse wall thickening. Stomach/Bowel: Stomach is nondistended, distorted by the adjacent pseudocyst. Enhancing small fundal varices as  discussed above. Small bowel and colon are nondilated. Normal appendix. Lymphatic: No pathologically enlarged lymph nodes. Reproductive: Moderate prostatic enlargement. Other: No free air. Musculoskeletal: Degenerative disc disease most marked L4-5 and L5-S1. IMPRESSION: 1. 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 2. 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 The pseudoaneurysm previously noted proximal to the pancreatic tail is no longer evident. 3. 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 Electronically Signed   By: Corlis Leak M.D.   On: 03/08/2016 14:45   Ct Angio Head W/cm &/or Wo Cm  03/06/2016  CLINICAL DATA:  Stroke EXAM: CT ANGIOGRAPHY HEAD AND NECK TECHNIQUE: Multidetector CT imaging  of the head and neck was performed using the standard protocol during bolus administration of intravenous contrast. Multiplanar CT image reconstructions and MIPs were obtained to evaluate the vascular anatomy. Carotid stenosis measurements (when applicable) are obtained utilizing NASCET criteria, using the distal internal carotid diameter as the denominator. CONTRAST:  50 mL Isovue 370 IV COMPARISON:  CT head 03/05/2016.  MRI 11/04/2013 and 06/11/2012 FINDINGS: Suboptimal arterial opacification due to timing of the injection. CTA NECK Aortic arch: Minimal atherosclerotic disease in the aortic arch. Proximal great vessels patent without significant stenosis. Lung apices clear.  No mass or adenopathy in the upper mediastinum. Right carotid system: Right common carotid artery widely patent. Atherosclerotic calcification at the bifurcation. 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 This appears chronic and was present in 2013. Left carotid system: Left common carotid artery widely patent. Atherosclerotic disease in the distal common carotid artery. Metal stent across the carotid bifurcation. The stent  is patent. Vertebral arteries:Proximal right vertebral artery is occluded with reconstitution in the mid neck. Moderate stenosis distal right vertebral artery. Left vertebral artery is patent to the basilar with scattered atherosclerotic disease. Skeleton: Cervical spondylosis.  No fracture or bone lesion. Other neck: No mass or adenopathy in the neck. Mucosal edema paranasal sinuses. CTA HEAD Anterior circulation: Right cavernous carotid is occluded which is chronic. There is reconstitution distally above the clinoid. Atherosclerotic calcification in the left cavernous carotid with mild stenosis. Anterior and middle cerebral arteries are poorly evaluated due to suboptimal opacification. These vessels appear patent Posterior circulation: Both vertebral arteries contribute to the basilar. PICA patent. Basilar patent. Posterior cerebral arteries patent bilaterally but not well evaluated due to poor opacification Venous sinuses: Patent Anatomic variants: None Delayed phase: Normal enhancement. Hypodensity in the high right parietal lobe as noted previously. This is slightly more prominent compared with yesterday and may represent subacute infarction. Negative for hemorrhage. IMPRESSION: Unfortunately, there is suboptimal arterial opacification due to timing of the injection. 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 Occlusion proximal right vertebral artery which reconstitutes in the mid neck. High right frontal cortical hypodensity is more prominent today and may represent subacute infarct Electronically Signed   By: Marlan Palau M.D.   On: 03/06/2016 12:24   Ct Head Wo Contrast  03/05/2016  CLINICAL DATA:  Left hemi neglect EXAM: CT HEAD WITHOUT CONTRAST TECHNIQUE: Contiguous axial images were obtained from the base of the skull through the vertex without intravenous contrast. COMPARISON:  MRI 11/04/2013 FINDINGS: Moderate atrophy. Diffuse ventricular enlargement  unchanged from the prior MRI. This may be due to communicating hydrocephalus versus atrophy. Ill-defined hypodensity in the high right parietal lobe over the convexity. This is most likely volume averaging of a prominent sulcus which appears similar to the prior MRI. Acute infarct considered less likely given the appearance. Otherwise no acute infarct. Negative for hemorrhage or mass lesion. IMPRESSION: Generalized atrophy with moderate ventricular enlargement, stable from the prior MRI 11/04/2013. Ventricular enlargement raises the possibility of communicating hydrocephalus versus atrophy Hypodensity high right frontal lobe most likely volume averaging of a prominent sulcus. Electronically Signed   By: Marlan Palau M.D.   On: 03/05/2016 16:31   Ct Angio Neck W/cm &/or Wo/cm  03/06/2016  CLINICAL DATA:  Stroke EXAM: CT ANGIOGRAPHY HEAD AND NECK TECHNIQUE: Multidetector CT imaging of the head and neck was performed using the standard protocol during bolus administration of intravenous contrast. Multiplanar CT image reconstructions and MIPs were obtained to  evaluate the vascular anatomy. Carotid stenosis measurements (when applicable) are obtained utilizing NASCET criteria, using the distal internal carotid diameter as the denominator. CONTRAST:  50 mL Isovue 370 IV COMPARISON:  CT head 03/05/2016.  MRI 11/04/2013 and 06/11/2012 FINDINGS: Suboptimal arterial opacification due to timing of the injection. CTA NECK Aortic arch: Minimal atherosclerotic disease in the aortic arch. Proximal great vessels patent without significant stenosis. Lung apices clear.  No mass or adenopathy in the upper mediastinum. Right carotid system: Right common carotid artery widely patent. Atherosclerotic calcification at the bifurcation. 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 This appears chronic and was present in 2013. Left carotid system: Left common carotid artery widely patent. Atherosclerotic disease in the  distal common carotid artery. Metal stent across the carotid bifurcation. The stent is patent. Vertebral arteries:Proximal right vertebral artery is occluded with reconstitution in the mid neck. Moderate stenosis distal right vertebral artery. Left vertebral artery is patent to the basilar with scattered atherosclerotic disease. Skeleton: Cervical spondylosis.  No fracture or bone lesion. Other neck: No mass or adenopathy in the neck. Mucosal edema paranasal sinuses. CTA HEAD Anterior circulation: Right cavernous carotid is occluded which is chronic. There is reconstitution distally above the clinoid. Atherosclerotic calcification in the left cavernous carotid with mild stenosis. Anterior and middle cerebral arteries are poorly evaluated due to suboptimal opacification. These vessels appear patent Posterior circulation: Both vertebral arteries contribute to the basilar. PICA patent. Basilar patent. Posterior cerebral arteries patent bilaterally but not well evaluated due to poor opacification Venous sinuses: Patent Anatomic variants: None Delayed phase: Normal enhancement. Hypodensity in the high right parietal lobe as noted previously. This is slightly more prominent compared with yesterday and may represent subacute infarction. Negative for hemorrhage. IMPRESSION: Unfortunately, there is suboptimal arterial opacification due to timing of the injection. 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 Occlusion proximal right vertebral artery which reconstitutes in the mid neck. High right frontal cortical hypodensity is more prominent today and may represent subacute infarct Electronically Signed   By: Marlan Palau M.D.   On: 03/06/2016 12:24   Mr Brain Wo Contrast  03/06/2016  CLINICAL DATA:  Acute onset of left hemiplegia. Last seen normal 2 days ago. EXAM: MRI HEAD WITHOUT CONTRAST TECHNIQUE: Multiplanar, multiecho pulse sequences of the brain and surrounding structures  were obtained without intravenous contrast. COMPARISON:  CT 03/05/2016.  CTA 03/06/2016. FINDINGS: Diffusion imaging and FLAIR axial sequences performed. The study suffers from motion degradation. There are scattered areas of acute infarction in the right middle cerebral artery territory. There are small areas of acute infarction in the right basal ganglia and along the insular cortex. There is a 1-2 cm region of acute infarction in the deep right frontal white matter. There is cortical and subcortical infarction in the right posterior frontal and frontoparietal junction region. There is mild swelling but no mass effect or shift. No hemorrhage is detected on these limited sequences. Elsewhere, the brain shows generalized atrophy. Chronic small-vessel changes of the deep white matter. Ventricular prominence secondary to generalized atrophy. IMPRESSION: Acute infarctions scattered in the right middle cerebral artery territory as outlined above. Most extensive involvement is at the posterior frontal and frontoparietal junction cortical and subcortical brain. No mass effect or shift. Electronically Signed   By: Paulina Fusi M.D.   On: 03/06/2016 15:59   US Abdomen Complete  03/07/2016  CLINICAL DATA:  Alcoholic with fatty liver, pancreatitis, ascites by ultrasound and CT in  2011. Assess for progression to cirrhosis and recurrent ascites. EXAM: ABDOMEN ULTRASOUND COMPLETE COMPARISON:  02/16/2014 FINDINGS: Gallbladder: Gallbladder has a normal appearance. Gallbladder wall is 1.5 mm, within normal limits. No stones or pericholecystic fluid. No sonographic Murphy's sign. Common bile duct: Diameter: 5.8 mm Liver: The liver is echogenic. There is attenuation of the ultrasound wave, poor visualization of the internal hepatic architecture, and loss of definition of the diaphragm. No focal liver lesions are identified. IVC: No abnormality visualized. Pancreas: Not well seen because of bowel gas. Spleen: Not well seen because  of bowel gas. Right Kidney: Length: 13.6 cm. Echogenicity within normal limits. No mass or hydronephrosis visualized. Left Kidney: Length: 13.3 cm . Echogenicity within normal limits. No mass or hydronephrosis visualized. Abdominal aorta: Proximal aspect not well seen. Visualized portion is not aneurysmal, 2.4 cm. Other findings: No ascites identified. Study quality is degraded by overlying bowel gas, and level of patient cooperation. IMPRESSION: 1. Hepatic steatosis.  No focal liver lesion. 2. No ascites. Electronically Signed   By: Norva Pavlov M.D.   On: 03/07/2016 11:08    Microbiology: Recent Results (from the past 240 hour(s))  MRSA PCR Screening     Status: None   Collection Time: 03/07/16  9:28 AM  Result Value Ref Range Status   MRSA by PCR NEGATIVE NEGATIVE Final    Comment:        The GeneXpert MRSA Assay (FDA approved for NASAL specimens only), is one component of a comprehensive MRSA colonization surveillance program. It is not intended to diagnose MRSA infection nor to guide or monitor treatment for MRSA infections.   Culture, Urine     Status: Abnormal   Collection Time: 03/09/16  3:49 PM  Result Value Ref Range Status   Specimen Description URINE, CLEAN CATCH  Final   Special Requests NONE  Final   Culture (A)  Final    >=100,000 COLONIES/mL KLEBSIELLA PNEUMONIAE >=100,000 COLONIES/mL ESCHERICHIA COLI    Report Status 03/11/2016 FINAL  Final   Organism ID, Bacteria KLEBSIELLA PNEUMONIAE (A)  Final   Organism ID, Bacteria ESCHERICHIA COLI (A)  Final      Susceptibility   Escherichia coli - MIC*    AMPICILLIN >=32 RESISTANT Resistant     CEFAZOLIN <=4 SENSITIVE Sensitive     CEFTRIAXONE <=1 SENSITIVE Sensitive     CIPROFLOXACIN <=0.25 SENSITIVE Sensitive     GENTAMICIN <=1 SENSITIVE Sensitive     IMIPENEM <=0.25 SENSITIVE Sensitive     NITROFURANTOIN <=16 SENSITIVE Sensitive     TRIMETH/SULFA <=20 SENSITIVE Sensitive     AMPICILLIN/SULBACTAM >=32 RESISTANT  Resistant     PIP/TAZO <=4 SENSITIVE Sensitive     * >=100,000 COLONIES/mL ESCHERICHIA COLI   Klebsiella pneumoniae - MIC*    AMPICILLIN >=32 RESISTANT Resistant     CEFAZOLIN <=4 SENSITIVE Sensitive     CEFTRIAXONE <=1 SENSITIVE Sensitive     CIPROFLOXACIN <=0.25 SENSITIVE Sensitive     GENTAMICIN <=1 SENSITIVE Sensitive     IMIPENEM <=0.25 SENSITIVE Sensitive     NITROFURANTOIN 32 SENSITIVE Sensitive     TRIMETH/SULFA <=20 SENSITIVE Sensitive     AMPICILLIN/SULBACTAM 16 INTERMEDIATE Intermediate     PIP/TAZO <=4 SENSITIVE Sensitive     * >=100,000 COLONIES/mL KLEBSIELLA PNEUMONIAE     Labs: Basic Metabolic Panel:  Recent Labs Lab 03/05/16 1555  03/09/16 0541 03/09/16 0826 03/09/16 1553 03/10/16 0510 03/11/16 0435 03/12/16 0510  NA 127*  < > 125*  --  126* 131* 131* 130*  K 3.5  < > 3.5  --  4.0 3.8 4.2 3.8  CL 92*  < > 95*  --  98* 98* 100* 99*  CO2 23  < > 22  --  21* 25 24 24   GLUCOSE 223*  < > 167*  --  292* 213* 248* 201*  BUN 66*  < > <5*  --  7 6 8 7   CREATININE 1.19  < > 0.50*  --  0.72 0.66 0.63 0.63  CALCIUM 8.5*  < > 8.4*  --  8.4* 8.8* 9.1 8.9  MG 2.3  --   --  1.8  --   --   --   --   < > = values in this interval not displayed. Liver Function Tests:  Recent Labs Lab 03/07/16 1433 03/08/16 0541 03/10/16 0510 03/11/16 0435 03/12/16 0510  AST 804* 423* 101* 67* 52*  ALT 780* 601* 322* 253* 186*  ALKPHOS 70 74 95 103 100  BILITOT 1.4* 1.4* 0.7 0.7 0.5  PROT 5.2* 5.1* 5.8* 6.4* 5.9*  ALBUMIN 2.5* 2.5* 2.6* 2.8* 2.6*    Recent Labs Lab 03/06/16 1620  AMMONIA 13   CBC:  Recent Labs Lab 03/05/16 1555  03/07/16 0213  03/08/16 0541 03/09/16 0541 03/09/16 1553 03/10/16 0510 03/11/16 0435 03/12/16 0510  WBC 13.2*  < > 11.5*  --  7.9 8.4  --  9.3 10.3 10.1  NEUTROABS 11.5*  --  9.4*  --  5.9 6.3  --   --   --   --   HGB 4.8*  < > 6.6*  6.6*  < > 7.8* 8.7* 9.1* 9.0* 9.5* 8.2*  HCT 14.8*  < > 20.5*  20.8*  < > 23.5* 26.0* 27.6* 28.1* 29.9*  25.9*  MCV 92.5  < > 91.6  --  88.0 87.2  --  88.9 91.2 91.2  PLT 192  < > 178  --  192 242  --  280 359 403*  < > = values in this interval not displayed. Cardiac Enzymes:  Recent Labs Lab 03/05/16 1555  CKTOTAL 511*  TROPONINI 0.07*    CBG:  Recent Labs Lab 03/11/16 1623 03/11/16 2211 03/12/16 0655 03/12/16 1113  GLUCAP 330* 220* 199* 226*    Signed:  Vassie Loll MD.  [facility] 03/12/2016, 1:25 PM

## 2016-03-12 NOTE — Progress Notes (Signed)
I met with pt at bedside and then with his permission contacted his Girlfriend, Estill Bamberg, to discuss plans for admit to inpt rehab. They both prefer inpt rehab rather than SNF. I contacted Dr. Dyann Kief and he is in agreement to admit. I will make the arrangements to admit today. RN CM and SW to be notified. [phone]

## 2016-03-12 NOTE — Progress Notes (Signed)
Ankit Karis Juba, MD Physician Signed Physical Medicine and Rehabilitation Consult Note 03/11/2016 6:26 AM  Related encounter: ED to Hosp-Admission (Current) from 03/05/2016 in [facility]O [facility]    Expand All Collapse All        Physical Medicine and Rehabilitation Consult Reason for Consult: Acute infarcts scattered in the right MCA territory Referring Physician: [facility]   HPI: Brandon [name] is a 50 y.o. 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 Patient lives with sister. Reportedly independent prior to admission. Sister does work during the day. Plans to stay with his girlfriend in [place] who also works part time. Presented 03/05/2016 with altered mental status and easily fatigued. He was found by a friend lying on the couch confused covered in feces and urine. MRI of the brain showed acute infarcts scattered in the right middle cerebral artery territory. CT angiogram of head and neck showed chronic occlusion right ICA. Left carotid stent was patent. Occlusion proximal right vertebral artery. Echocardiogram with ejection fraction of 55-60% no wall motion abnormalities. Patient did not receive TPA. Ultrasound the abdomen showed no focal lesions or ascites. 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 Findings of hemoglobin 4.8 and transfused. Underwent upper GI endoscopy 03/08/2016 per Dr. Adela Lank with findings of large gastric varices without bleeding. Currently on no anticoagulation due to GI bleed. Full liquid diet. Urine culture greater than 100,000 gram-negative rods maintained on Rocephin. Hyponatremia 125-131 and monitored. Physical therapy evaluation completed 03/09/2016 with  recommendations of physical medicine rehabilitation consult.  Review of Systems  Constitutional: Negative for fever and chills.  HENT: Negative for hearing loss.  Eyes: Negative for blurred vision and double vision.  Respiratory: Positive for cough.   Shortness of breath with heavy exertion  Cardiovascular: Negative for chest pain.  Gastrointestinal: Positive for nausea and constipation. Negative for vomiting.   GERD  Skin: Negative for rash.  Neurological: Positive for weakness and headaches. Negative for seizures.  Psychiatric/Behavioral: Positive for depression.   Anxiety  All other systems reviewed and are negative.  Past Medical History  Diagnosis Date  . Alcohol dependency (HCC)     Hx ETOH withdrawal seizures before 2011  . Pancreatitis     CT findings in May 2011 with inflammation and pseudocyst  . GERD (gastroesophageal reflux disease)   . CAD (coronary artery disease) 06/13/2012    Calcification noted on CTA of chest in 2012 Wall motion abnormality on ECHO   . 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 Hyperlipidemia   . Headache(784.0)     migraine  . Heart disease 02/2015    PCI/DES placed to RCA: on chronic Plavix/ASA  . Depression with anxiety   . Hypertension   . Carotid artery disease (HCC) 2016    bilateral. s/p left carotid stent 03/2015   Past Surgical History  Procedure Laterality Date  . None    . Carotid angiogram N/A 06/15/2012    Procedure: CAROTID ANGIOGRAM; Surgeon: Chuck Hint, MD; Location: [facility] CATH LAB; Service: Cardiovascular; Laterality: N/A;  . Cardiac catheterization N/A 03/15/2015    Procedure: Left Heart Cath; Surgeon: Laurier Nancy, MD; Location: [facility] INVASIVE CV LAB; Service: Cardiovascular; Laterality: N/A;  . Cardiac catheterization  N/A 03/16/2015    Procedure: Coronary Stent  Intervention; Surgeon: Alwyn Peawayne D Callwood, MD; Location: [facility] INVASIVE CV LAB; Service: Cardiovascular; Laterality: N/A;  . Peripheral vascular catheterization Left 04/06/2015    Procedure: Carotid PTA/Stent Intervention; Surgeon: Annice NeedyJason S Dew, MD; Location: [facility] INVASIVE CV LAB; Service: Cardiovascular; Laterality: Left;   Family History  Problem Relation Age of Onset  . Stroke Mother     deceased  . Coronary artery disease Mother   . Alcohol abuse Mother   . Cancer Mother   . Hypertension Father     alive  . Alcohol abuse Father   . Diabetes Father   . Kidney disease Father    Social History:  reports that he has been smoking Cigarettes. He has a 30 pack-year smoking history. He has never used smokeless tobacco. He reports that he drinks about 3.6 oz of alcohol per week. He reports that he does not use illicit drugs. Allergies:  Allergies  Allergen Reactions  . No Known Allergies    Medications Prior to Admission  Medication Sig Dispense Refill  . albuterol (PROAIR HFA) 108 (90 Base) MCG/ACT inhaler Inhale 2 puffs into the lungs every 6 (six) hours as needed for wheezing or shortness of breath.     Marland Kitchen. albuterol (PROVENTIL) (2.5 MG/3ML) 0.083% nebulizer solution Take 2.5 mg by nebulization as needed for wheezing or shortness of breath.    Marland Kitchen. amLODipine (NORVASC) 10 MG tablet Take 1 tablet (10 mg total) by mouth once. (Patient taking differently: Take 10 mg by mouth daily. ) 30 tablet 2  . aspirin 81 MG tablet Take 81 mg by mouth every morning.     . B Complex Vitamins (VITAMIN-B COMPLEX PO) Take 1 tablet by mouth daily.     Marland Kitchen. buPROPion (WELLBUTRIN SR) 150 MG 12 hr tablet Start 1 in the am for 3 days then 1 tab twice daily. 60 tablet 5  . busPIRone (BUSPAR) 10 MG tablet Take 10 mg by mouth 2 (two) times daily.    . clopidogrel (PLAVIX) 75 MG tablet Take 1 tablet (75 mg total) by  mouth daily with breakfast. 30 tablet 2  . cyclobenzaprine (FLEXERIL) 10 MG tablet Take 1 tablet (10 mg total) by mouth 3 (three) times daily as needed for muscle spasms. 90 tablet 5  . folic acid (FOLVITE) 1 MG tablet Take 1 mg by mouth daily.     Marland Kitchen. GARLIC OIL 1500 PO Take 1,500 mg by mouth daily.     . hydrochlorothiazide (MICROZIDE) 12.5 MG capsule Take 12.5 mg by mouth daily.    Marland Kitchen. lisinopril (PRINIVIL,ZESTRIL) 10 MG tablet Take 10 mg by mouth daily.    Marland Kitchen. lubiprostone (AMITIZA) 24 MCG capsule Take 24 mcg by mouth as needed for constipation.    . metoprolol (LOPRESSOR) 50 MG tablet Take 50 mg by mouth daily.    . Multiple Vitamin (MULTIVITAMIN) tablet Take 1 tablet by mouth daily.    . Omega-3 Fatty Acids (FISH OIL PO) Take 1 capsule by mouth daily.    Marland Kitchen. omeprazole (PRILOSEC) 20 MG capsule Take 20 mg by mouth daily.    . Pancrelipase, Lip-Prot-Amyl, (ZENPEP) 25000 UNITS CPEP Take 2 capsules by mouth 3 (three) times daily with meals.     . simvastatin (ZOCOR) 20 MG tablet Take 20 mg by mouth daily at 6 PM.     . tiotropium (SPIRIVA HANDIHALER) 18 MCG inhalation capsule Place 18 mcg into inhaler and inhale daily.     . traMADol (ULTRAM) 50  MG tablet Take 1-2 tablets (50-100 mg total) by mouth every 6 (six) hours as needed. for pain 90 tablet 1  . traZODone (DESYREL) 150 MG tablet Take 1 tablet (150 mg total) by mouth at bedtime. 30 tablet 1    Home: Home Living Family/patient expects to be discharged to:: Private residence Living Arrangements: Other relatives (sister) Available Help at Discharge: Family, Available PRN/intermittently Type of Home: House Home Access: Stairs to enter Secretary/administrator of Steps: 5 Entrance Stairs-Rails: Right, Left Home Layout: Two level Alternate Level Stairs-Number of Steps: flight Bathroom Shower/Tub: Tub/shower unit Home Equipment: None Lives With: Significant other,  Other (Comment) (sister)  Functional History: Prior Function Level of Independence: Independent Functional Status:  Mobility: Bed Mobility Overal bed mobility: Needs Assistance Bed Mobility: Supine to Sit Supine to sit: +2 for physical assistance, Mod assist General bed mobility comments: Assist to bring LLE off bed and to elevate trunk into sitting Transfers Overall transfer level: Needs assistance Equipment used: 2 person hand held assist, Ambulation equipment used Transfer via Lift Equipment: Stedy Transfers: Sit to/from Stand, Scientist, clinical (histocompatibility and immunogenetics) Transfers Sit to Stand: +2 physical assistance, Mod assist Squat pivot transfers: +2 physical assistance, Mod assist General transfer comment: Performed bed to chair with bilat forearm support. Pt unable to fully stand this way and squat pivoted to chair. From chair stood pt with [facility] and able to achieve full standing with lt knee blocked by shin pad.      ADL:    Cognition: Cognition Overall Cognitive Status: Impaired/Different from baseline Arousal/Alertness: Awake/alert Orientation Level: Oriented to person, Oriented to place, Oriented to time, Oriented to situation, Oriented X4 Attention: Sustained Sustained Attention: Impaired Sustained Attention Impairment: Verbal basic Memory: Impaired Memory Impairment: Storage deficit Awareness: (intellectual awareness appears adequate) Behaviors: Impulsive Cognition Arousal/Alertness: Awake/alert Behavior During Therapy: Impulsive Overall Cognitive Status: Impaired/Different from baseline Area of Impairment: Orientation, Attention, Memory, Safety/judgement, Problem solving Orientation Level: Disoriented to, Situation Current Attention Level: Sustained Memory: Decreased recall of precautions, Decreased short-term memory Safety/Judgement: Decreased awareness of safety, Decreased awareness of deficits Problem Solving: Slow processing, Requires verbal cues, Requires tactile cues General  Comments: Lt inattention/neglect  Blood pressure 141/90, pulse 85, temperature 98.4 F (36.9 C), temperature source Oral, resp. rate 12, height 6\' 1"  (1.854 m), weight 85.1 kg (187 lb 9.8 oz), SpO2 100 %. Physical Exam  Vitals reviewed. Constitutional: He appears well-developed and well-nourished.  HENT:  Head: Normocephalic and atraumatic.  Eyes: EOM are normal. Right eye exhibits no discharge. Left eye exhibits no discharge.  Neck: Normal range of motion. Neck supple. No thyromegaly present.  Cardiovascular: Normal rate and regular rhythm.  Respiratory: Effort normal and breath sounds normal. No respiratory distress.  GI: Soft. Bowel sounds are normal. He exhibits no distension.  Musculoskeletal: He exhibits no edema or tenderness.  Neurological: He is alert.  Mood is flat but appropriate.  He is able to provide his name, Age as well as place. Follows simple commands. DTRs symmetric Motor: Right upper extremity/right lower extremity: 5/5 proximal to distal Left upper extremity: 4/5 proximal to distal. Apraxia Left lower extremity: 3/5 proximal to distal. Apraxia.  Skin: Skin is warm and dry.  Psychiatric: He has a normal mood and affect. His behavior is normal.     Lab Results Last 24 Hours    Results for orders placed or performed during the hospital encounter of 03/05/16 (from the past 24 hour(s))  Comprehensive metabolic panel Status: Abnormal   Collection Time: 03/11/16 4:35 AM  Result Value  Ref Range   Sodium 131 (L) 135 - 145 mmol/L   Potassium 4.2 3.5 - 5.1 mmol/L   Chloride 100 (L) 101 - 111 mmol/L   CO2 24 22 - 32 mmol/L   Glucose, Bld 248 (H) 65 - 99 mg/dL   BUN 8 6 - 20 mg/dL   Creatinine, Ser 0.45 0.61 - 1.24 mg/dL   Calcium 9.1 8.9 - 40.9 mg/dL   Total Protein 6.4 (L) 6.5 - 8.1 g/dL   Albumin 2.8 (L) 3.5 - 5.0 g/dL   AST 67 (H) 15 - 41 U/L   ALT 253 (H) 17 - 63 U/L   Alkaline Phosphatase 103  38 - 126 U/L   Total Bilirubin 0.7 0.3 - 1.2 mg/dL   GFR calc non Af Amer >60 >60 mL/min   GFR calc Af Amer >60 >60 mL/min   Anion gap 7 5 - 15  CBC Status: Abnormal   Collection Time: 03/11/16 4:35 AM  Result Value Ref Range   WBC 10.3 4.0 - 10.5 K/uL   RBC 3.28 (L) 4.22 - 5.81 MIL/uL   Hemoglobin 9.5 (L) 13.0 - 17.0 g/dL   HCT 81.1 (L) 91.4 - 78.2 %   MCV 91.2 78.0 - 100.0 fL   MCH 29.0 26.0 - 34.0 pg   MCHC 31.8 30.0 - 36.0 g/dL   RDW 95.6 (H) 21.3 - 08.6 %   Platelets 359 150 - 400 K/uL      Imaging Results (Last 48 hours)    No results found.    Assessment/Plan: Diagnosis: Acute infarcts scattered in the right MCA territory Labs and images independently reviewed. Records reviewed and summated above. Stroke: Continue secondary stroke prophylaxis and Risk Factor Modification listed below:  Antiplatelet therapy:  Blood Pressure Management: Continue current medication with prn's with permisive HTN per primary team Statin Agent:  Diabetes management:  Tobacco abuse:  Left sided hemiparesis: fit for orthosis to prevent contractures (resting hand splint for day, wrist cock up splint at night, PRAFO, etc)  Motor recovery: Fluoxetine  1. Does the need for close, 24 hr/day medical supervision in concert with the patient's rehab needs make it unreasonable for this patient to be served in a less intensive setting? Yes  2. 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 Due to skin/wound care, disease management, medication administration and patient education, does the patient require 24 hr/day rehab nursing? Yes 4. Does the patient require coordinated care of a physician, rehab nurse, PT (1-2 hrs/day, 5 days/week) and OT (1-2 hrs/day, 5 days/week) to address physical and functional deficits in the context of the above medical diagnosis(es)? Yes Addressing deficits in the following areas: balance, endurance, locomotion, strength, transferring, toileting and psychosocial support 5. Can the patient actively participate in an intensive therapy program of at least 3 hrs of therapy per day at least 5 days per week? Potentially 6. The potential for patient to make measurable gains while on inpatient rehab is excellent 7. Anticipated functional outcomes upon discharge from inpatient rehab are min assist with PT,  supervision and min assist with OT, independent and modified independent with SLP. 8. Estimated rehab length of stay to reach the above functional goals is:18-22 days. 9. Does the patient have adequate social supports and living environment to accommodate these discharge functional goals? Potentially 10. Anticipated D/C setting: Home 11. Anticipated post D/C treatments: HH therapy and Home excercise program 12. Overall Rehab/Functional Prognosis: good and fair  RECOMMENDATIONS: This patient's condition is appropriate for continued rehabilitative care in the following setting: CIR once medically stable. Patient has agreed to participate in recommended program. Yes Note that insurance prior authorization may be required for reimbursement for recommended care.  Comment: Rehab Admissions Coordinator to follow up.  Maryla Morrow, MD 03/11/2016       Revision History     Date/Time User Provider Type  Action   03/11/2016 1:58 PM Ankit Karis Juba, MD Physician Sign   03/11/2016 6:48 AM Charlton Amor, PA-C Physician Assistant Pend   View Details Report       Routing History     Date/Time From To Method   03/11/2016 1:58 PM Ankit Karis Juba, MD Laurier Nancy, MD Fax

## 2016-03-12 NOTE — Progress Notes (Signed)
Initial Nutrition Assessment   INTERVENTION:  Provide Glucerna Shake po BID, each supplement provides 220 kcal and 10 grams of protein   NUTRITION DIAGNOSIS:   Inadequate oral intake related to inability to eat as evidenced by NPO status.  Resolved; diet advanced  GOAL:   Patient will meet greater than or equal to 90% of their needs  Unmet/improving  MONITOR:   Diet advancement, PO intake, Skin, I & O's  REASON FOR ASSESSMENT:   Low Braden    ASSESSMENT:   50 year old gentleman who comes from home after being found confused, disoriented and with left-sided hemiparesis, his abdomen is soft and nontender Patient has had a CVA in the past affecting the same region. Urinalysis negative for infection, fecal occult positive for blood. CT of the head negative for acute infarct or bleed.  Pt's diet was advanced to a Soft diet 6/19 after pt being NPO or on a liquid diet since 6/13. Pt reports good appetite and eating 100% of meals so far. He complains of constipation, reporting last BM was one week ago. He reports significant weight loss, stating that he weighed 220 lbs PTA. RD brought pt a Glucerna Shake which pt enjoyed and is agreeable to consuming BID to meet energy needs and promote weight stabilization. RD provided diet tips to prevent constipation.   Labs: low sodium, elevated glucose  Diet Order:  DIET SOFT Room service appropriate?: Yes; Fluid consistency:: Thin Diet - low sodium heart healthy  Skin:  Wound (see comment) (stage I pressure ulcer on foot)  Last BM:  6/16  Height:   Ht Readings from Last 1 Encounters:  03/05/16 6\' 1"  (1.854 m)    Weight:   Wt Readings from Last 1 Encounters:  03/11/16 187 lb 9.8 oz (85.1 kg)    Ideal Body Weight:  83.6 kg  BMI:  Body mass index is 24.76 kg/(m^2).  Estimated Nutritional Needs:   Kcal:  2200-2400  Protein:  110-125 gm  Fluid:  2.2-2.4 L  EDUCATION NEEDS:   No education needs identified at this  time  Dorothea Ogleeanne Sonora Catlin RD, LDN Inpatient Clinical Dietitian Pager: [phone] After Hours Pager: [phone]

## 2016-03-12 NOTE — Interval H&P Note (Signed)
Brandon [name] was admitted today to Inpatient Rehabilitation with the diagnosis of right MCA infarct.  The patient's history has been reviewed, patient examined, and there is no change in status.  Patient continues to be appropriate for intensive inpatient rehabilitation.  I have reviewed the patient's chart and labs.  Questions were answered to the patient's satisfaction. The PAPE has been reviewed and assessment remains appropriate.  Brandon [name] T 03/12/2016, 6:50 PM

## 2016-03-12 NOTE — Progress Notes (Signed)
Standley BrookingBarbara G Josephine Wooldridge, RN Rehab Admission Coordinator Signed Physical Medicine and Rehabilitation PMR Pre-admission 03/12/2016 11:25 AM  Related encounter: ED to Hosp-Admission (Current) [facility]    Expand All Collapse All   PMR Admission Coordinator Pre-Admission Assessment  Patient: Brandon [name] is an 50 y.o., male MRN: [id number] DOB: [birth year] Height: 6\' 1"  (185.4 cm) Weight: 85.1 kg (187 lb 9.8 oz)  Insurance Information HMO: PPO: PCP: IPA: 80/20: OTHER:  PRIMARY: Medicaid [place] Access Policy#: [id number] p Subscriber: pt Benefits: Phone #: [phone] Name: 03/11/16 Eff. Date: 03/11/16 active   Medicaid Application Date: Case Manager:  Disability Application Date: Case Worker:  Currently in appeal for his disability. [name] states she needs CM assist to give more information to help with appeal that is up for review in 10 days  Emergency Contact Information Contact Information    Name Relation Home Work Mobile   Little ValleyLester,[name] Significant other [phone]     Marga HootsJustices,[name] Sister [phone]       Current Medical History  Patient Admitting Diagnosis: acute infarcts scattered in the right MCA territory  History of Present Illness:Brandon [name] is a 50 y.o. 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 Patient lives with sister. Reportedly independent prior to admission. Presented 03/05/2016 with altered mental status and easily fatigued. He was found by a friend lying  on the couch confused covered in feces and urine. MRI of the brain showed acute infarcts scattered in the right middle cerebral artery territory. CT angiogram of head and neck showed chronic occlusion right ICA. Left carotid stent was patent. Occlusion proximal right vertebral artery. Echocardiogram with ejection fraction of 55-60% no wall motion abnormalities. Patient did not receive TPA. Ultrasound the abdomen showed no focal lesions or ascites. 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 Findings of hemoglobin 4.8 and transfused. Underwent upper GI endoscopy 03/08/2016 per Dr. 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 Currently on no anticoagulation due to GI bleed. Full liquid diet advanced to regular diet on 03/11/16. Urine culture greater than 100,000 gram-negative rods maintained on Rocephin. Hyponatremia 125-131 and monitored.  Total: 7 NIH    Past Medical History  Past Medical History  Diagnosis Date  . Alcohol dependency (HCC)     Hx ETOH withdrawal seizures before 2011  . Pancreatitis 2011....     CT findings in May 2011 with inflammation and pseudocyst. 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/2016  . GERD (gastroesophageal reflux disease)   . CAD (coronary artery disease) 06/13/2012    Calcification noted on CTA of chest in 2012 Wall motion abnormality on ECHO   . 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   Marland Kitchen. Hyperlipidemia   . Headache(784.0)     migraine  . Heart disease 02/2015    PCI/DES placed to RCA: on chronic Plavix/ASA  . Depression with anxiety   . Hypertension   . Carotid artery disease (HCC) 2016    bilateral. s/p left carotid stent 03/2015  . Renal artery stenosis, native, bilateral (HCC) 02/2015    Family History  family history includes Alcohol abuse in his father and mother; Cancer in his  mother; Coronary artery disease in his mother;  Diabetes in his father; Hypertension in his father; Kidney disease in his father; Stroke in his mother.  Prior Rehab/Hospitalizations:  Has the patient had major surgery during 100 days prior to admission? No  Current Medications   Current facility-administered medications:  . albuterol (PROVENTIL) (2.5 MG/3ML) 0.083% nebulizer solution 3 mL, 3 mL, Inhalation, Q6H PRN, Rodolph Bong, MD . busPIRone (BUSPAR) tablet 5 mg, 5 mg, Oral, BID, Rodolph Bong, MD, 5 mg at 03/12/16 1005 . cefTRIAXone (ROCEPHIN) 1 g in dextrose 5 % 50 mL IVPB, 1 g, Intravenous, Q24H, Rodolph Bong, MD, 1 g at 03/11/16 2247 . folic acid (FOLVITE) tablet 1 mg, 1 mg, Oral, Daily, Vassie Loll, MD, 1 mg at 03/12/16 1005 . insulin aspart (novoLOG) injection 0-9 Units, 0-9 Units, Subcutaneous, TID WC, Vassie Loll, MD, 3 Units at 03/12/16 1140 . insulin glargine (LANTUS) injection 5 Units, 5 Units, Subcutaneous, Daily, Vassie Loll, MD, 5 Units at 03/12/16 1006 . labetalol (NORMODYNE,TRANDATE) injection 10 mg, 10 mg, Intravenous, Q4H PRN, Coralie Keens, MD . lipase/protease/amylase (CREON) capsule 24,000 Units, 2 capsule, Oral, TID WC, Rodolph Bong, MD, 24,000 Units at 03/12/16 1140 . LORazepam (ATIVAN) injection 2-3 mg, 2-3 mg, Intravenous, Q1H PRN, Coralie Keens, MD, 3 mg at 03/10/16 1200 . morphine 2 MG/ML injection 2 mg, 2 mg, Intravenous, Q4H PRN, Coralie Keens, MD, 2 mg at 03/11/16 0010 . nicotine (NICODERM CQ - dosed in mg/24 hours) patch 21 mg, 21 mg, Transdermal, Daily, Mauricio Annett Gula, MD, 21 mg at 03/12/16 1004 . ondansetron (ZOFRAN) injection 4 mg, 4 mg, Intravenous, Q6H PRN, Coralie Keens, MD . pantoprazole (PROTONIX) EC tablet 40 mg, 40 mg, Oral, BID, Vassie Loll, MD, 40 mg at 03/12/16 1005 . sodium chloride flush (NS) 0.9 % injection 3 mL, 3 mL, Intravenous, Q12H, Mauricio Annett Gula,  MD, 3 mL at 03/12/16 1000 . thiamine (VITAMIN B-1) tablet 100 mg, 100 mg, Oral, Daily, Vassie Loll, MD, 100 mg at 03/12/16 1005 . 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 Has not driven in 10 years for did not have a Sales executive / Equipment Home Assistive Devices/Equipment: None Home Equipment: None  Prior Device Use: Indicate devices/aids used by the patient prior to current illness, exacerbation or injury? None of the above  Prior Functional Level Prior Function Level of Independence: Independent  Self Care: Did the patient need help bathing, dressing, using the toilet or eating? Independent  Indoor Mobility: Did the patient need assistance with walking from room to room (with or without device)? Independent  Stairs: Did the patient need assistance with internal or external stairs (with or without device)? Independent  Functional Cognition: Did the patient need help planning regular tasks such as shopping or remembering to take medications? Independent  Current Functional Level Cognition  Arousal/Alertness: Awake/alert Overall Cognitive Status: Impaired/Different from baseline Current Attention Level: Sustained Orientation Level: Oriented X4 Safety/Judgement: Decreased awareness of safety, Decreased awareness of deficits General Comments: Lt inattention/neglect; states hes not really  having hard time with anything since his  stroke Attention: Sustained Sustained Attention: Impaired Sustained Attention Impairment: Verbal basic Memory: Impaired Memory Impairment: Storage deficit Awareness: (intellectual awareness appears adequate) Behaviors: Impulsive   Extremity Assessment (includes Sensation/Coordination)  Upper Extremity Assessment: LUE deficits/detail LUE Deficits / Details: generalized weakness. Demonstrates isolated movement LUE but requries cues to use LUE. Able to grasp can, wash cloth. Able to reach L hand behind head. Unable to hold L shoulder 1t 90 for greater than 2 seconds  LUE Sensation: decreased light touch, decreased proprioception (staes arm feels "funny") LUE Coordination: decreased fine motor, decreased gross motor  Lower Extremity Assessment: LLE deficits/detail LLE Deficits / Details: ankle 1/5, knee 2-/5, hip 2-/5    ADLs  Overall ADL's : Needs assistance/impaired Eating/Feeding: Set up, Sitting Eating/Feeding Details (indicate cue type and reason): ate all of food on R side of tray before attending to L side of tray Grooming: Minimal assistance, Sitting Upper Body Bathing: Minimal assitance, Sitting Lower Body Bathing: Moderate assistance, Sit to/from stand Upper Body Dressing : Moderate assistance Upper Body Dressing Details (indicate cue type and reason): Placed R arm in gown then unable to problem solve how to get LUE into gown Lower Body Dressing: Moderate assistance Lower Body Dressing Details (indicate cue type and reason): Able to start putting sock on L foot. Unaware L hand not helping Toileting - Clothing Manipulation Details (indicate cue type and reason): foley Functional mobility during ADLs: Moderate assistance, +2 for physical assistance, Cueing for safety, Cueing for sequencing General ADL Comments: L lateral lean. Unable to maintain midline. Unable to maintain control of L hip/knee    Mobility  Overal bed mobility: Needs Assistance Bed Mobility: Supine  to Sit Supine to sit: +2 for physical assistance, Mod assist General bed mobility comments: OOB in chair    Transfers  Overall transfer level: Needs assistance Equipment used: 2 person hand held assist, Ambulation equipment used Transfer via Lift Equipment: Stedy Transfers: Sit to/from Stand Sit to Stand: +2 physical assistance, Mod assist Squat pivot transfers: +2 physical assistance, Mod assist General transfer comment: Stood with RW. Poor orientation in RW. Stood again with B arms over therapist's shoulders.Max physical assist to shift weightonto RLE. Unable to maintain midline postural control    Ambulation / Gait / Stairs / Wheelchair Mobility       Posture / Balance Dynamic Sitting Balance Sitting balance - Comments: sitting edge of chair with mod factilitation for trunk extension and head centered over shoulders for about 10 seconds maintained.  Balance Overall balance assessment: Needs assistance Sitting-balance support: Feet supported Sitting balance-Leahy Scale: Poor Sitting balance - Comments: sitting edge of chair with mod factilitation for trunk extension and head centered over shoulders for about 10 seconds maintained.  Postural control: Left lateral lean Standing balance support: Bilateral upper extremity supported Standing balance-Leahy Scale: Poor Standing balance comment: mod A of 2 for standing with walker versus "three amigos" with mod facilitation through trunk for midline orientation, L trunk and hip extension with support at knee; responded momentarily to traction of L side with increased activation; standing two trials with assist for over 30 seconds each trial with lateral weight shifts (noted resistand to R lateral weight shift)    Special needs/care consideration  Skin intact  Bowel mgmt: large BM 6/17. Black Bladder mgmt: condom catheter at times and attempt at urinal History of smoking one pack per day ETOH abuse/  vodka 1/2 to 1 gallon per day   Previous Home Environment Living  Arrangements: (Living with sister for 9 months. Befored that lived with his) Lives With: Family (sister for 9 months ) Available Help at Discharge: Friend(s), Available 24 hours/day [name] states she will arrange 24/7 assist for d/c) Type of Home: House Home Layout: Two level Alternate Level Stairs-Number of Steps: flight Home Access: Stairs to enter Entrance Stairs-Rails: Right, Left Entrance Stairs-Number of Steps: 5 Bathroom Shower/Tub: Engineer, manufacturing systems: Standard Bathroom Accessibility: Yes How Accessible: Accessible via walker Home Care Services: No Additional Comments: states house is handicapped accessible Had lived with [place] until 9 months ago when he went to live with his sister. ETOH abuse with pt and his sister.  Discharge Living Setting Plans for Discharge Living Setting: Lives with (comment) (Plan to d/c home with his girlfriend, [name]) Type of Home at Discharge: House Discharge Home Layout: One level Discharge Home Access: Stairs to enter Entrance Stairs-Rails: Right, Left, Can reach both Entrance Stairs-Number of Steps: 4 [name] states she will get a ramp installed if needed) Discharge Bathroom Shower/Tub: Tub/shower unit, Curtain Discharge Bathroom Toilet: Standard Discharge Bathroom Accessibility: Yes How Accessible: Accessible via walker, Accessible via wheelchair Does the patient have any problems obtaining your medications?: No Pt and [name] state he will d/c home with [name] at d/c and she can arrange 24/7 assist in her home. [name] is a Research officer, political party.  Social/Family/Support Systems Patient Roles: Partner Contact Information: [name], girlfriend, primary contact Anticipated Caregiver: [name] Anticipated Caregiver's Contact Information: see above Ability/Limitations of Caregiver: [name] works as Research officer, political party for 20 years. She states she will arrange assist in  her home as needed Caregiver Availability: 24/7 Discharge Plan Discussed with Primary Caregiver: Yes Is Caregiver In Agreement with Plan?: Yes Does Caregiver/Family have Issues with Lodging/Transportation while Pt is in Rehab?: No  Goals/Additional Needs Patient/Family Goal for Rehab: Min assist with PT, Min assist with OT, supervision with SLP Expected length of stay: ELOS 18-22 days Pt/Family Agrees to Admission and willing to participate: Yes Program Orientation Provided & Reviewed with Pt/Caregiver Including Roles & Responsibilities: Yes  Decrease burden of Care through IP rehab admission: n/a  Possible need for SNF placement upon discharge:not anticipated  Patient Condition: This patient's condition remains as documented in the consult dated 03/11/2016, in which the Rehabilitation Physician determined and documented that the patient's condition is appropriate for intensive rehabilitative care in an inpatient rehabilitation facility. Will admit to inpatient rehab today.  Preadmission Screen Completed By: Clois Dupes, 03/12/2016 11:59 AM ______________________________________________________________________  Discussed status with Dr. Riley Kill on 03/12/16 at 1159 and received telephone approval for admission today.  Admission Coordinator: Clois Dupes, time 1610 Date 03/12/16          Cosigned by: Ranelle Oyster, MD at 03/12/2016 1:14 PM  Revision History     Date/Time User Provider Type Action   03/12/2016 1:14 PM Ranelle Oyster, MD Physician Cosign   03/12/2016 11:59 AM Standley Brooking, RN Rehab Admission Coordinator Sign

## 2016-03-12 NOTE — Progress Notes (Signed)
Daily Rounding Note  03/12/2016, 12:22 PM  LOS: 7 days   SUBJECTIVE:       No complaints.  BMs not black or bloody.  Eating well.  No pain. No weakness or dizziness,   OBJECTIVE:         Vital signs in last 24 hours:    Temp:  [98.1 F (36.7 C)-99.2 F (37.3 C)] 99.2 F (37.3 C) (06/20 1014) Pulse Rate:  [90-100] 93 (06/20 1014) Resp:  [16-18] 18 (06/20 1014) BP: (114-138)/(74-92) 114/75 mmHg (06/20 1014) SpO2:  [95 %-99 %] 99 % (06/20 1014) Last BM Date: 03/08/16 Filed Weights   03/09/16 0300 03/10/16 0324 03/11/16 0308  Weight: 88.1 kg (194 lb 3.6 oz) 85.6 kg (188 lb 11.4 oz) 85.1 kg (187 lb 9.8 oz)   General: looks unwell.  alert   Heart: RRR Chest: clear bil Abdomen: soft, NT, ND.  No mass or HSM  Extremities: no CCE Neuro/Psych:  Weakness on left LE>>> left UE.  Alert.  Appropriate.   Intake/Output from previous day: 06/19 0701 - 06/20 0700 In: 963 [P.O.:960; I.V.:3] Out: 1775 [Urine:1775]  Intake/Output this shift:    Lab Results:  Recent Labs  03/10/16 0510 03/11/16 0435 03/12/16 0510  WBC 9.3 10.3 10.1  HGB 9.0* 9.5* 8.2*  HCT 28.1* 29.9* 25.9*  PLT 280 359 403*   BMET  Recent Labs  03/10/16 0510 03/11/16 0435 03/12/16 0510  NA 131* 131* 130*  K 3.8 4.2 3.8  CL 98* 100* 99*  CO2 GLUCOSE 213* 248* 201*  BUN CREATININE 0.66 0.63 0.63  CALCIUM 8.8* 9.1 8.9   LFT  Recent Labs  03/10/16 0510 03/11/16 0435 03/12/16 0510  PROT 5.8* 6.4* 5.9*  ALBUMIN 2.6* 2.8* 2.6*  AST 101* 67* 52*  ALT 322* 253* 186*  ALKPHOS 95 103 100  BILITOT 0.7 0.7 0.5   PT/INR No results for input(s): LABPROT, INR in the last 72 hours. Hepatitis Panel No results for input(s): HEPBSAG, HCVAB, HEPAIGM, HEPBIGM in the last 72 hours.  Studies/Results: No results found.  Scheduled Meds: . busPIRone  5 mg Oral BID  . cefTRIAXone (ROCEPHIN)  IV  1 g Intravenous Q24H  . folic acid   1 mg Oral Daily  . insulin aspart  0-9 Units Subcutaneous TID WC  . insulin glargine  5 Units Subcutaneous Daily  . lipase/protease/amylase  2 capsule Oral TID WC  . nicotine  21 mg Transdermal Daily  . pantoprazole  40 mg Oral BID  . sodium chloride flush  3 mL Intravenous Q12H  . thiamine  100 mg Oral Daily  . tiotropium  18 mcg Inhalation Daily   Continuous Infusions:  PRN Meds:.albuterol, labetalol, LORazepam, morphine injection, ondansetron (ZOFRAN) IV  ASSESMENT:   * Right MCA stroke.   * Chronic, long-term alcoholism.   * Anemia (Hgb 4.8), dark/FOBT + stool. Hgb stable post PRBC x 4.  EGD: gastric varix. The large appearance of this may be result of mass effect from pseudocyst.  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 No evidence of cirrhosis.  On BID PO Protonix. Ultimately needs splenectomy but currently not an op candidate, not yet seen by gen surgery. No evidence of recurrent or ongoing bleeding.   * Hx acute and  chronic alcoholic pancreatitis. Now with large pseudocyst. On Pancrease.   * DM. New diagnosis. No steroids on meds lists.   * 03/16/2015 cardiac cath (unstable angina) with DES to RCA: Supposed to be on Plavix and 81 mg ASA PTA.     PLAN   *  Daily PPI.  Would get splenectomy consult from Gen surgery before pt discharges, his compliance may not be reliable for outpt follow up.   *  ETOH abstinence and rehab, AA meetings.   *  Once daily PPI.  Add Nadolol 40 mg daily.      Jennye MoccasinSarah Natale Barba  03/12/2016, 12:22 PM Pager: [phone]

## 2016-03-12 NOTE — Consult Note (Signed)
Reason for Consult: splenic vein thrombosis Referring Physician: Dr. Barton Dubois    HPI: Brandon [name] is a 50 year old male with a history of CVA, chronic pancreatitis, presented on 6/13 with an acute CVA along with a GIB.  initally found to have a hgb of 4.8 and given 4 units of PRBCs throughout the hospitalization.  He was not a candidate for TPA given acute bleeding.  He subsequently underwent a upper endoscopy which revealed large gastric varices without bleeding.  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  We have been asked to evaluate for splenectomy.    The patient is stable and being discharged to inpatient rehab.   Past Medical History  Diagnosis Date  . Alcohol dependency ([place])     Hx ETOH withdrawal seizures before 2011  . Pancreatitis 2011....     CT findings in May 2011 with inflammation and pseudocyst.  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/2016  . GERD (gastroesophageal reflux disease)   . CAD (coronary artery disease) 06/13/2012    Calcification noted on CTA of chest in 2012 Wall motion abnormality on ECHO    . 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   Marland Kitchen Hyperlipidemia   . Headache(784.0)     migraine  . Heart disease 02/2015    PCI/DES placed to RCA: on chronic Plavix/ASA  . Depression with anxiety   . Hypertension   . Carotid artery disease ([place]) 2016    bilateral.  s/p left carotid stent 03/2015  . Renal artery stenosis, native, bilateral ([place]) 02/2015    Past Surgical History  Procedure Laterality Date  . None    . Carotid angiogram N/A 06/15/2012    Procedure: CAROTID ANGIOGRAM;  Surgeon: Angelia Mould, MD;  Location: [facility] CATH LAB;  Service: Cardiovascular;  Laterality: N/A;  . Cardiac catheterization N/A 03/15/2015    Procedure: Left Heart Cath;  Surgeon: Dionisio David, MD;  Location: [place] CV LAB;  Service: Cardiovascular;   Laterality: N/A;  . Cardiac catheterization N/A 03/16/2015    Procedure: Coronary Stent Intervention;  Surgeon: Yolonda Kida, MD;  Location: [place] CV LAB;  Service: Cardiovascular;  Laterality: N/A;  . Peripheral vascular catheterization Left 04/06/2015    Procedure: Carotid PTA/Stent Intervention;  Surgeon: Algernon Huxley, MD;  Location: [place] CV LAB;  Service: Cardiovascular;  Laterality: Left;  . Esophagogastroduodenoscopy N/A 03/08/2016    Procedure: ESOPHAGOGASTRODUODENOSCOPY (EGD);  Surgeon: Manus Gunning, MD;  Location: [place];  Service: Gastroenterology;  Laterality: N/A;    Family History  Problem Relation Age of Onset  . Stroke Mother     deceased  . Coronary artery disease Mother   . Alcohol abuse Mother   . Cancer Mother   . Hypertension Father     alive  . Alcohol abuse Father   . Diabetes Father   . Kidney disease Father     Social History:  reports that he has been smoking Cigarettes.  He has a 30 pack-year smoking history. He has never used smokeless tobacco. He reports that he drinks about 3.6 oz of alcohol per week. He reports that he does not use illicit drugs.  Allergies:  Allergies  Allergen Reactions  . No Known Allergies     Medications:  Scheduled Meds: . busPIRone  5 mg Oral BID  . cefTRIAXone (ROCEPHIN)  IV  1 g Intravenous  G92J  . folic acid  1 mg Oral Daily  . insulin aspart  0-9 Units Subcutaneous TID WC  . insulin glargine  5 Units Subcutaneous Daily  . lipase/protease/amylase  2 capsule Oral TID WC  . nadolol  40 mg Oral Daily  . nicotine  21 mg Transdermal Daily  . [START ON 03/13/2016] pantoprazole  40 mg Oral Daily  . sodium chloride flush  3 mL Intravenous Q12H  . thiamine  100 mg Oral Daily  . tiotropium  18 mcg Inhalation Daily   Continuous Infusions:  PRN Meds:.albuterol, labetalol, LORazepam, morphine injection, ondansetron (ZOFRAN) IV   Results for orders placed or performed during the hospital  encounter of 03/05/16 (from the past 48 hour(s))  Comprehensive metabolic panel     Status: Abnormal   Collection Time: 03/11/16  4:35 AM  Result Value Ref Range   Sodium 131 (L) 135 - 145 mmol/L   Potassium 4.2 3.5 - 5.1 mmol/L   Chloride 100 (L) 101 - 111 mmol/L   CO2 24 22 - 32 mmol/L   Glucose, Bld 248 (H) 65 - 99 mg/dL   BUN 8 6 - 20 mg/dL   Creatinine, Ser 0.63 0.61 - 1.24 mg/dL   Calcium 9.1 8.9 - 10.3 mg/dL   Total Protein 6.4 (L) 6.5 - 8.1 g/dL   Albumin 2.8 (L) 3.5 - 5.0 g/dL   AST 67 (H) 15 - 41 U/L   ALT 253 (H) 17 - 63 U/L   Alkaline Phosphatase 103 38 - 126 U/L   Total Bilirubin 0.7 0.3 - 1.2 mg/dL   GFR calc non Af Amer >60 >60 mL/min   GFR calc Af Amer >60 >60 mL/min    Comment: (NOTE) The eGFR has been calculated using the CKD EPI equation. This calculation has not been validated in all clinical situations. eGFR's persistently <60 mL/min signify possible Chronic Kidney Disease.    Anion gap 7 5 - 15  CBC     Status: Abnormal   Collection Time: 03/11/16  4:35 AM  Result Value Ref Range   WBC 10.3 4.0 - 10.5 K/uL   RBC 3.28 (L) 4.22 - 5.81 MIL/uL   Hemoglobin 9.5 (L) 13.0 - 17.0 g/dL   HCT 29.9 (L) 39.0 - 52.0 %   MCV 91.2 78.0 - 100.0 fL   MCH 29.0 26.0 - 34.0 pg   MCHC 31.8 30.0 - 36.0 g/dL   RDW 17.8 (H) 11.5 - 15.5 %   Platelets 359 150 - 400 K/uL  Hemoglobin A1c     Status: None   Collection Time: 03/11/16  4:19 PM  Result Value Ref Range   Hgb A1c MFr Bld 5.6 4.8 - 5.6 %    Comment: (NOTE)         Pre-diabetes: 5.7 - 6.4         Diabetes: >6.4         Glycemic control for adults with diabetes: <7.0    Mean Plasma Glucose 114 mg/dL    Comment: (NOTE) Performed At: [facility] [address] [place], [place] [id number] Lindon Romp MD KG:[id number]   Glucose, capillary     Status: Abnormal   Collection Time: 03/11/16  4:23 PM  Result Value Ref Range   Glucose-Capillary 330 (H) 65 - 99 mg/dL  Glucose, capillary     Status:  Abnormal   Collection Time: 03/11/16 10:11 PM  Result Value Ref Range   Glucose-Capillary 220 (H) 65 - 99 mg/dL  Comment 1 Notify RN    Comment 2 Document in Chart   Comprehensive metabolic panel     Status: Abnormal   Collection Time: 03/12/16  5:10 AM  Result Value Ref Range   Sodium 130 (L) 135 - 145 mmol/L   Potassium 3.8 3.5 - 5.1 mmol/L   Chloride 99 (L) 101 - 111 mmol/L   CO2 24 22 - 32 mmol/L   Glucose, Bld 201 (H) 65 - 99 mg/dL   BUN 7 6 - 20 mg/dL   Creatinine, Ser 0.63 0.61 - 1.24 mg/dL   Calcium 8.9 8.9 - 10.3 mg/dL   Total Protein 5.9 (L) 6.5 - 8.1 g/dL   Albumin 2.6 (L) 3.5 - 5.0 g/dL   AST 52 (H) 15 - 41 U/L   ALT 186 (H) 17 - 63 U/L   Alkaline Phosphatase 100 38 - 126 U/L   Total Bilirubin 0.5 0.3 - 1.2 mg/dL   GFR calc non Af Amer >60 >60 mL/min   GFR calc Af Amer >60 >60 mL/min    Comment: (NOTE) The eGFR has been calculated using the CKD EPI equation. This calculation has not been validated in all clinical situations. eGFR's persistently <60 mL/min signify possible Chronic Kidney Disease.    Anion gap 7 5 - 15  CBC     Status: Abnormal   Collection Time: 03/12/16  5:10 AM  Result Value Ref Range   WBC 10.1 4.0 - 10.5 K/uL   RBC 2.84 (L) 4.22 - 5.81 MIL/uL   Hemoglobin 8.2 (L) 13.0 - 17.0 g/dL   HCT 25.9 (L) 39.0 - 52.0 %   MCV 91.2 78.0 - 100.0 fL   MCH 28.9 26.0 - 34.0 pg   MCHC 31.7 30.0 - 36.0 g/dL   RDW 17.5 (H) 11.5 - 15.5 %   Platelets 403 (H) 150 - 400 K/uL  Glucose, capillary     Status: Abnormal   Collection Time: 03/12/16  6:55 AM  Result Value Ref Range   Glucose-Capillary 199 (H) 65 - 99 mg/dL   Comment 1 Notify RN    Comment 2 Document in Chart   Glucose, capillary     Status: Abnormal   Collection Time: 03/12/16 11:13 AM  Result Value Ref Range   Glucose-Capillary 226 (H) 65 - 99 mg/dL    No results found.  Review of Systems  Constitutional: Negative for fever and chills.  Respiratory: Negative for cough, shortness of  breath and wheezing.   Cardiovascular: Negative for chest pain and palpitations.  Gastrointestinal: Positive for melena. Negative for nausea, vomiting, abdominal pain, diarrhea and constipation.  Genitourinary: Negative for dysuria.  Neurological: Positive for focal weakness. Negative for dizziness, tingling, tremors, sensory change, seizures, loss of consciousness and headaches.   Blood pressure 134/94, pulse 108, temperature 97.9 F (36.6 C), temperature source Oral, resp. rate 18, height '6\' 1"'$  (1.854 m), weight 85.1 kg (187 lb 9.8 oz), SpO2 98 %. Physical Exam  Constitutional: He is oriented to person, place, and time. He appears well-developed and well-nourished. No distress.  HENT:  Head: Normocephalic and atraumatic.  Eyes: Conjunctivae and EOM are normal. Pupils are equal, round, and reactive to light. No scleral icterus.  Cardiovascular: Normal rate, regular rhythm, normal heart sounds and intact distal pulses.   Respiratory: Effort normal and breath sounds normal. No respiratory distress. He has no wheezes. He has no rales. He exhibits no tenderness.  GI: Soft. Bowel sounds are normal. He exhibits no distension and no mass. There  is no tenderness. There is no rebound and no guarding.  Musculoskeletal: Normal range of motion. He exhibits no edema or tenderness.  Neurological: He is alert and oriented to person, place, and time.  Left sided weakness, moderate LLE  Skin: Skin is warm and dry. He is not diaphoretic.  Psychiatric: He has a normal mood and affect. His behavior is normal. Judgment and thought content normal.    Assessment/Plan: Splenic vein thrombosis Chronic pancreatitis with pseudocyst Acute CVA(03/05/16) Cirrhosis  Alcohol abuse   At present time, the patient is stable and not actively bleeding.  The treatment for splenic vein thrombosis is a splenectomy.   Due to recent stroke, he would be a high risk surgical candidate.  Once he recovers from the stroke and  cleared by neurology, we can discuss potential splenectomy in the future.  Can follow up on outpatient basis.  Thank you for the consult.  Please call for further assistance.   Totiana Everson ANP-BC 03/12/2016, 3:18 PM

## 2016-03-12 NOTE — PMR Pre-admission (Signed)
PMR Admission Coordinator Pre-Admission Assessment  Patient: Brandon [name] is an 50 y.o., male MRN: [id number] DOB: 22-Dec-1965 Height:  (185.4 cm) Weight: 85.1 kg (187 lb 9.8 oz)              Insurance Information HMO:     PPO:      PCP:      IPA:      80/20:      OTHER:  PRIMARY: Medicaid  Access      Policy#: [id number]      Subscriber: pt Benefits:  Phone #: [phone]     Name: 03/11/16 Eff. Date: 03/11/16 active      Medicaid Application Date:       Case Manager:  Disability Application Date:       Case Worker:  Currently in appeal for his disability. Brandon [name] states she needs CM assist to give more information to help with appeal that is up for review in 10 days  Emergency Contact Information Contact Information    Name Relation Home Work Mobile   [place] Significant other [phone]     Brandon [name] [phone]       Current Medical History  Patient Admitting Diagnosis: acute infarcts scattered in the right MCA territory  History of Present Illness:Brandon [name] is a 50 y.o. 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 Patient lives with sister. Reportedly independent prior to admission.  Presented 03/05/2016 with altered mental status and easily fatigued. He was found by a friend lying on the couch confused covered in feces and urine. MRI of the brain showed acute infarcts scattered in the right middle cerebral artery territory. CT angiogram of head and neck showed chronic occlusion right ICA. Left carotid stent was patent. Occlusion proximal right vertebral artery. Echocardiogram with ejection fraction of 55-60% no wall motion abnormalities. Patient did not receive TPA. Ultrasound the abdomen showed no focal lesions or  ascites. 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 Findings of hemoglobin 4.8 and transfused. Underwent upper GI endoscopy 03/08/2016 per Dr. Adela Lank with findings of large gastric varices without bleeding. Currently on no anticoagulation due to GI bleed. Full liquid diet advanced to regular diet on 03/11/16. Urine culture greater than 100,000 gram-negative rods maintained on Rocephin. Hyponatremia 125-131 and monitored.   Total: 7 NIH    Past Medical History  Past Medical History  Diagnosis Date  . Alcohol dependency (HCC)     Hx ETOH withdrawal seizures before 2011  . Pancreatitis 2011....     CT findings in May 2011 with inflammation and pseudocyst.  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/2016  . GERD (gastroesophageal reflux disease)   . CAD (coronary artery disease) 06/13/2012    Calcification noted on CTA of chest in 2012 Wall motion abnormality on ECHO    . 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   Marland Kitchen Hyperlipidemia   . Headache(784.0)     migraine  . Heart disease 02/2015    PCI/DES placed to RCA: on chronic Plavix/ASA  . Depression with anxiety   . Hypertension   . Carotid artery disease (HCC) 2016    bilateral.  s/p left carotid stent 03/2015  . Renal artery stenosis, native, bilateral (HCC) 02/2015  Family History  family history includes Alcohol abuse in his father and mother; Cancer in his mother; Coronary artery disease in his mother; Diabetes in his father; Hypertension in his father; Kidney disease in his father; Stroke in his mother.  Prior Rehab/Hospitalizations:  Has the patient had major surgery during 100 days prior to admission? No  Current Medications   Current facility-administered medications:  .  albuterol (PROVENTIL) (2.5 MG/3ML) 0.083% nebulizer solution 3 mL, 3 mL, Inhalation, Q6H PRN, Rodolph Bong, MD .  busPIRone (BUSPAR) tablet 5 mg, 5 mg, Oral, BID,  Rodolph Bong, MD, 5 mg at 03/12/16 1005 .  cefTRIAXone (ROCEPHIN) 1 g in dextrose 5 % 50 mL IVPB, 1 g, Intravenous, Q24H, Rodolph Bong, MD, 1 g at 03/11/16 2247 .  folic acid (FOLVITE) tablet 1 mg, 1 mg, Oral, Daily, Vassie Loll, MD, 1 mg at 03/12/16 1005 .  insulin aspart (novoLOG) injection 0-9 Units, 0-9 Units, Subcutaneous, TID WC, Vassie Loll, MD, 3 Units at 03/12/16 1140 .  insulin glargine (LANTUS) injection 5 Units, 5 Units, Subcutaneous, Daily, Vassie Loll, MD, 5 Units at 03/12/16 1006 .  labetalol (NORMODYNE,TRANDATE) injection 10 mg, 10 mg, Intravenous, Q4H PRN, Coralie Keens, MD .  lipase/protease/amylase (CREON) capsule 24,000 Units, 2 capsule, Oral, TID WC, Rodolph Bong, MD, 24,000 Units at 03/12/16 1140 .  LORazepam (ATIVAN) injection 2-3 mg, 2-3 mg, Intravenous, Q1H PRN, Coralie Keens, MD, 3 mg at 03/10/16 1200 .  morphine 2 MG/ML injection 2 mg, 2 mg, Intravenous, Q4H PRN, Coralie Keens, MD, 2 mg at 03/11/16 0010 .  nicotine (NICODERM CQ - dosed in mg/24 hours) patch 21 mg, 21 mg, Transdermal, Daily, Mauricio Annett Gula, MD, 21 mg at 03/12/16 1004 .  ondansetron (ZOFRAN) injection 4 mg, 4 mg, Intravenous, Q6H PRN, Coralie Keens, MD .  pantoprazole (PROTONIX) EC tablet 40 mg, 40 mg, Oral, BID, Vassie Loll, MD, 40 mg at 03/12/16 1005 .  sodium chloride flush (NS) 0.9 % injection 3 mL, 3 mL, Intravenous, Q12H, Mauricio Annett Gula, MD, 3 mL at 03/12/16 1000 .  thiamine (VITAMIN B-1) tablet 100 mg, 100 mg, Oral, Daily, Vassie Loll, MD, 100 mg at 03/12/16 1005 .  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 Has not driven in 10 years for did not have a Sales executive / Equipment Home Assistive Devices/Equipment: None Home Equipment: None  Prior Device Use: Indicate devices/aids used by the patient prior to current illness, exacerbation or injury? None of the above  Prior Functional Level Prior Function Level of Independence: Independent  Self Care: Did the patient need help bathing, dressing, using the toilet or eating?  Independent  Indoor Mobility: Did the patient need assistance with walking from room to room (with or without device)? Independent  Stairs: Did the patient need assistance with internal or external stairs (with or without device)? Independent  Functional Cognition: Did the patient need help planning regular tasks such as shopping or remembering to take  medications? Independent  Current Functional Level Cognition  Arousal/Alertness: Awake/alert Overall Cognitive Status: Impaired/Different from baseline Current Attention Level: Sustained Orientation Level: Oriented X4 Safety/Judgement: Decreased awareness of safety, Decreased awareness of deficits General Comments: Lt inattention/neglect; states hes not really having hard time with anything since his stroke Attention: Sustained Sustained Attention: Impaired Sustained Attention Impairment: Verbal basic Memory: Impaired Memory Impairment: Storage deficit Awareness:  (intellectual awareness appears adequate) Behaviors: Impulsive    Extremity Assessment (includes Sensation/Coordination)  Upper Extremity Assessment: LUE deficits/detail LUE Deficits / Details: generalized weakness. Demonstrates isolated movement LUE but requries cues to use LUE. Able to grasp can, wash cloth. Able  to reach L hand  behind head. Unable to hold L shoulder 1t 90 for greater than 2 seconds  LUE Sensation: decreased light touch, decreased proprioception (staes arm feels "funny") LUE Coordination: decreased fine motor, decreased gross motor  Lower Extremity Assessment: LLE deficits/detail LLE Deficits / Details: ankle 1/5, knee 2-/5, hip 2-/5    ADLs  Overall ADL's : Needs assistance/impaired Eating/Feeding: Set up, Sitting Eating/Feeding Details (indicate cue type and reason): ate all of food on R side of tray before attending to L side of tray Grooming: Minimal assistance, Sitting Upper Body Bathing: Minimal assitance, Sitting Lower Body Bathing: Moderate assistance, Sit to/from stand Upper Body Dressing : Moderate assistance Upper Body Dressing Details (indicate cue type and reason): Placed R arm in gown then unable to problem solve how to get LUE into gown Lower Body Dressing: Moderate assistance Lower Body Dressing Details (indicate cue type and reason): Able to start putting sock on L foot. Unaware L hand not helping Toileting - Clothing Manipulation Details (indicate cue type and reason): foley Functional mobility during ADLs: Moderate assistance, +2 for physical assistance, Cueing for safety, Cueing for sequencing General ADL Comments: L lateral lean. Unable to maintain midline. Unable to maintain control of L hip/knee    Mobility  Overal bed mobility: Needs Assistance Bed Mobility: Supine to Sit Supine to sit: +2 for physical assistance, Mod assist General bed mobility comments: OOB in chair    Transfers  Overall transfer level: Needs assistance Equipment used: 2 person hand held assist, Ambulation equipment used Transfer via Lift Equipment: Stedy Transfers: Sit to/from Stand Sit to Stand: +2 physical assistance, Mod assist Squat pivot transfers: +2 physical assistance, Mod assist General transfer comment: Stood with RW. Poor orientation in RW. Stood again with B arms over therapist's  shoulders.Max physical assist to shift weightonto RLE. Unable to maintain midline postural control    Ambulation / Gait / Stairs / Wheelchair Mobility       Posture / Balance Dynamic Sitting Balance Sitting balance - Comments: sitting edge of chair with mod factilitation for trunk extension and head centered over shoulders for about 10 seconds maintained.  Balance Overall balance assessment: Needs assistance Sitting-balance support: Feet supported Sitting balance-Leahy Scale: Poor Sitting balance - Comments: sitting edge of chair with mod factilitation for trunk extension and head centered over shoulders for about 10 seconds maintained.  Postural control: Left lateral lean Standing balance support: Bilateral upper extremity supported Standing balance-Leahy Scale: Poor Standing balance comment: mod A of 2 for standing with walker versus "three amigos" with mod facilitation through trunk for midline orientation, L trunk and hip extension with support at knee; responded momentarily to traction of L side with increased activation; standing two trials with assist for over 30 seconds each trial with lateral weight shifts (noted resistand to R lateral weight shift)    Special needs/care  consideration  Skin intact                             Bowel mgmt: large BM 6/17. Black Bladder mgmt: condom catheter at times and attempt at urinal History of smoking one pack per day ETOH abuse/ vodka 1/2 to 1 gallon per day   Previous Home Environment Living Arrangements:  (Living with sister for 9 months. Befored that lived with his)  Lives With: Family (sister for 9 months ) Available Help at Discharge: Friend(s), Available 24 hours/day Brandon [name] states she will arrange 24/7 assist for d/c) Type of Home: House Home Layout: Two level Alternate Level Stairs-Number of Steps: flight Home Access: Stairs to enter Entrance Stairs-Rails: Right, Left Entrance Stairs-Number of Steps: 5 Bathroom Shower/Tub:  Engineer, manufacturing systems: Standard Bathroom Accessibility: Yes How Accessible: Accessible via walker Home Care Services: No Additional Comments: states house is handicapped accessible Had lived with [place] until 9 months ago when he went to live with his sister. ETOH abuse with pt and his sister.  Discharge Living Setting Plans for Discharge Living Setting: Lives with (comment) (Plan to d/c home with his girlfriend, Brandon [name]) Type of Home at Discharge: House Discharge Home Layout: One level Discharge Home Access: Stairs to enter Entrance Stairs-Rails: Right, Left, Can reach both Entrance Stairs-Number of Steps: 4 Brandon [name] states she will get a ramp installed if needed) Discharge Bathroom Shower/Tub: Tub/shower unit, Curtain Discharge Bathroom Toilet: Standard Discharge Bathroom Accessibility: Yes How Accessible: Accessible via walker, Accessible via wheelchair Does the patient have any problems obtaining your medications?: No Pt and Brandon [name] state he will d/c home with Brandon [name] at d/c and she can arrange 24/7 assist in her home. 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  Social/Family/Support Systems Patient Roles: Partner Contact Information: [name], girlfriend, primary contact Anticipated Caregiver: Brandon [name] Anticipated Caregiver's Contact Information: see above Ability/Limitations of Caregiver: Brandon [name] works as Research officer, political party for 20 years. She states she will arrange assist in her home as needed Caregiver Availability: 24/7 Discharge Plan Discussed with Primary Caregiver: Yes Is Caregiver In Agreement with Plan?: Yes Does Caregiver/Family have Issues with Lodging/Transportation while Pt is in Rehab?: No  Goals/Additional Needs Patient/Family Goal for Rehab: Min assist with PT, Min assist with OT, supervision with SLP Expected length of stay: [facility] 18-22 days Pt/Family Agrees to Admission and willing to participate: Yes Program Orientation Provided & Reviewed with Pt/Caregiver  Including Roles  & Responsibilities: Yes  Decrease burden of Care through IP rehab admission: n/a  Possible need for SNF placement upon discharge:not anticipated  Patient Condition: This patient's condition remains as documented in the consult dated 03/11/2016, in which the Rehabilitation Physician determined and documented that the patient's condition is appropriate for intensive rehabilitative care in an inpatient rehabilitation facility. Will admit to inpatient rehab today.  Preadmission Screen Completed By:  Clois Dupes, 03/12/2016 11:59 AM ______________________________________________________________________   Discussed status with Dr. Riley Kill on 03/12/16 at  1159 and received telephone approval for admission today.  Admission Coordinator:  Clois Dupes, time 1610 Date 03/12/16

## 2016-03-12 NOTE — H&P (View-Only) (Signed)
Physical Medicine and Rehabilitation Admission H&P    Chief Complaint  Patient presents with  . Loss of Consciousness  : HPI: Brandon [name] is a 50 y.o. 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 Patient lives with sister. Reportedly independent prior to admission. Sister does work during the day. Plans to stay with his girlfriend in [place] who also works part time. Presented 03/05/2016 with altered mental status and easily fatigued. He was found by a friend lying on the couch confused covered in feces and urine. MRI of the brain showed acute infarcts scattered in the right middle cerebral artery territory. CT angiogram of head and neck showed chronic occlusion right ICA. Left carotid stent was patent. Occlusion proximal right vertebral artery. Echocardiogram with ejection fraction of 55-60% no wall motion abnormalities. Patient did not receive TPA. Ultrasound the abdomen showed no focal lesions or ascites. 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 Findings of hemoglobin 4.8 and transfused. Underwent upper GI endoscopy 03/08/2016 per Dr. Havery Moros with findings of large gastric varices without bleeding. Low-dose aspirin 81 mg daily resumed 03/12/2016. No plans to resume Plavix at this time. Full liquid diet Advanced to mechanical soft. Urine culture greater than 100,000 gram-negative rods maintained on Rocephin changed to Ceftin 500 mg twice daily 5 days 03/12/2016. Hyponatremia 125-131 and monitored. Physical therapy evaluation completed 03/09/2016 with recommendations of physical medicine rehabilitation consult.Patient was admitted for a comprehensive rehabilitation program  ROS Constitutional: Negative for fever and  chills.  HENT: Negative for hearing loss.  Eyes: Negative for blurred vision and double vision.  Respiratory: Positive for cough.   Shortness of breath with heavy exertion  Cardiovascular: Negative for chest pain.  Gastrointestinal: Positive for nausea and constipation. Negative for vomiting.   GERD  Skin: Negative for rash.  Neurological: Positive for weakness and headaches. Negative for seizures.  Psychiatric/Behavioral: Positive for depression.   Anxiety  All other systems reviewed and are negative   Past Medical History  Diagnosis Date  . Alcohol dependency ([place])     Hx ETOH withdrawal seizures before 2011  . Pancreatitis 2011....     CT findings in May 2011 with inflammation and pseudocyst.  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/2016  . GERD (gastroesophageal reflux disease)   . CAD (coronary artery disease) 06/13/2012    Calcification noted on CTA of chest in 2012 Wall motion abnormality on ECHO    . 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   Marland Kitchen Hyperlipidemia   . Headache(784.0)     migraine  . Heart disease 02/2015    PCI/DES placed to RCA: on chronic Plavix/ASA  . Depression with anxiety   . Hypertension   . Carotid artery disease ([place]) 2016    bilateral.  s/p left carotid stent 03/2015  . Renal artery stenosis, native, bilateral ([place]) 02/2015   Past Surgical History  Procedure Laterality Date  . None    . Carotid angiogram N/A 06/15/2012    Procedure: CAROTID ANGIOGRAM;  Surgeon: Angelia Mould, MD;  Location: [facility] CATH LAB;  Service: Cardiovascular;  Laterality: N/A;  . Cardiac catheterization N/A 03/15/2015    Procedure: Left Heart Cath;  Surgeon: Dionisio David, MD;  Location: [place]  CV LAB;  Service: Cardiovascular;  Laterality: N/A;  . Cardiac catheterization N/A 03/16/2015    Procedure: Coronary Stent Intervention;  Surgeon: Yolonda Kida, MD;  Location: [place] CV LAB;   Service: Cardiovascular;  Laterality: N/A;  . Peripheral vascular catheterization Left 04/06/2015    Procedure: Carotid PTA/Stent Intervention;  Surgeon: Algernon Huxley, MD;  Location: [place] CV LAB;  Service: Cardiovascular;  Laterality: Left;  . Esophagogastroduodenoscopy N/A 03/08/2016    Procedure: ESOPHAGOGASTRODUODENOSCOPY (EGD);  Surgeon: Manus Gunning, MD;  Location: [place];  Service: Gastroenterology;  Laterality: N/A;   Family History  Problem Relation Age of Onset  . Stroke Mother     deceased  . Coronary artery disease Mother   . Alcohol abuse Mother   . Cancer Mother   . Hypertension Father     alive  . Alcohol abuse Father   . Diabetes Father   . Kidney disease Father    Social History:  reports that he has been smoking Cigarettes.  He has a 30 pack-year smoking history. He has never used smokeless tobacco. He reports that he drinks about 3.6 oz of alcohol per week. He reports that he does not use illicit drugs. Allergies:  Allergies  Allergen Reactions  . No Known Allergies    Medications Prior to Admission  Medication Sig Dispense Refill  . albuterol (PROAIR HFA) 108 (90 Base) MCG/ACT inhaler Inhale 2 puffs into the lungs every 6 (six) hours as needed for wheezing or shortness of breath.     Marland Kitchen albuterol (PROVENTIL) (2.5 MG/3ML) 0.083% nebulizer solution Take 2.5 mg by nebulization as needed for wheezing or shortness of breath.    Marland Kitchen aspirin 81 MG tablet Take 81 mg by mouth every morning.     . B Complex Vitamins (VITAMIN-B COMPLEX PO) Take 1 tablet by mouth daily.     . busPIRone (BUSPAR) 5 MG tablet Take 1 tablet (5 mg total) by mouth 2 (two) times daily.    . cefUROXime (CEFTIN) 500 MG tablet Take 1 tablet (500 mg total) by mouth 2 (two) times daily with a meal.    . folic acid (FOLVITE) 1 MG tablet Take 1 mg by mouth daily.      Marland Kitchen GARLIC OIL 1308 PO Take 1,500 mg by mouth daily.     . insulin aspart (NOVOLOG) 100 UNIT/ML injection Inject 0-9 Units  into the skin 3 (three) times daily with meals.    . insulin glargine (LANTUS) 100 UNIT/ML injection Inject 0.05 mLs (5 Units total) into the skin daily.    Marland Kitchen lisinopril (PRINIVIL,ZESTRIL) 10 MG tablet Take 10 mg by mouth daily.    Marland Kitchen lubiprostone (AMITIZA) 24 MCG capsule Take 24 mcg by mouth as needed for constipation.    . Multiple Vitamin (MULTIVITAMIN) tablet Take 1 tablet by mouth daily.    . nadolol (CORGARD) 40 MG tablet Take 1 tablet (40 mg total) by mouth daily. 30 tablet 1  . nicotine (NICODERM CQ - DOSED IN MG/24 HOURS) 21 mg/24hr patch Place 1 patch (21 mg total) onto the skin daily. 28 patch 0  . Omega-3 Fatty Acids (FISH OIL PO) Take 1 capsule by mouth daily.    Marland Kitchen omeprazole (PRILOSEC) 20 MG capsule Take 2 capsules (40 mg total) by mouth daily. 60 capsule 1  . Pancrelipase, Lip-Prot-Amyl, (ZENPEP) 25000 UNITS CPEP Take 2 capsules by mouth 3 (three) times daily with meals.     . simvastatin (ZOCOR) 20 MG tablet Take 20 mg  by mouth daily at 6 PM.     . tiotropium (SPIRIVA HANDIHALER) 18 MCG inhalation capsule Place 18 mcg into inhaler and inhale daily.     . traMADol (ULTRAM) 50 MG tablet Take 1 tablet (50 mg total) by mouth every 8 (eight) hours as needed for severe pain. for pain    . traZODone (DESYREL) 150 MG tablet Take 1 tablet (150 mg total) by mouth at bedtime. 30 tablet 1    Home: Home Living Family/patient expects to be discharged to:: Private residence Living Arrangements:  (Living with sister for 9 months. Befored that lived with his) Available Help at Discharge: Friend(s), Available 24 hours/day Estill Bamberg states she will arrange 24/7 assist for d/c) Type of Home: House Home Access: Stairs to enter [employer] of Steps: 5 Entrance Stairs-Rails: Right, Left Home Layout: Two level Alternate Level Stairs-Number of Steps: flight Bathroom Shower/Tub: Optometrist: Yes Home Equipment: None Additional  Comments: states house is handicapped accessible  Lives With: Family (sister for 9 months )   Functional History: Prior Function Level of Independence: Independent  Functional Status:  Mobility: Bed Mobility Overal bed mobility: Needs Assistance Bed Mobility: Supine to Sit Supine to sit: +2 for physical assistance, Mod assist General bed mobility comments: OOB in chair Transfers Overall transfer level: Needs assistance Equipment used: 2 person hand held assist, Ambulation equipment used Transfer via [place]: St[place] Transfers: Sit to/from Stand Sit to Stand: +2 physical assistance, Mod assist Squat pivot transfers: +2 physical assistance, Mod assist General transfer comment: Stood with RW. Poor orientation in RW. Stood again with B arms over therapist's shoulders.Max physical assist to shift weightonto RLE. Unable to maintain midline postural control      PYK:DXIPJAS ADL's : Needs assistance/impaired Eating/Feeding: Set up;Sitting Eating/Feeding Details (indicate cue type and reason): ate all of food on R side of tray before attending to L side of tray Grooming: Minimal assistance;Sitting   Upper Body Bathing: Minimal assitance;Sitting   Lower Body Bathing: Moderate assistance;Sit to/from stand   Upper Body Dressing : Moderate assistance Upper Body Dressing Details (indicate cue type and reason): Placed R arm in gown then unable to problem solve how to get LUE into gown Lower Body Dressing: Moderate assistance Lower Body Dressing Details (indicate cue type and reason): Able to start putting sock on L foot. Unaware L hand not helping       Toileting - Clothing Manipulation Details (indicate cue type and reason): foley     Functional mobility during ADLs: Moderate assistance;+2 for physical assistance;Cueing for safety;Cueing for sequencing General ADL Comments: L lateral lean. Unable to maintain midline. Unable to maintain control of L hip/knee ADL Overall ADL's :  Needs assistance/impaired Eating/Feeding: Set up, Sitting Eating/Feeding Details (indicate cue type and reason): ate all of food on R side of tray before attending to L side of tray Grooming: Minimal assistance, Sitting Upper Body Bathing: Minimal assitance, Sitting Lower Body Bathing: Moderate assistance, Sit to/from stand Upper Body Dressing : Moderate assistance Upper Body Dressing Details (indicate cue type and reason): Placed R arm in gown then unable to problem solve how to get LUE into gown Lower Body Dressing: Moderate assistance Lower Body Dressing Details (indicate cue type and reason): Able to start putting sock on L foot. Unaware L hand not helping Toileting - Clothing Manipulation Details (indicate cue type and reason): foley Functional mobility during ADLs: Moderate assistance, +2 for physical assistance, Cueing for safety, Cueing for sequencing General ADL Comments:  L lateral lean. Unable to maintain midline. Unable to maintain control of L hip/knee  Cognition: Cognition Overall Cognitive Status: Impaired/Different from baseline Arousal/Alertness: Awake/alert Orientation Level: Oriented X4 Attention: Sustained Sustained Attention: Impaired Sustained Attention Impairment: Verbal basic Memory: Impaired Memory Impairment: Storage deficit Awareness:  (intellectual awareness appears adequate) Behaviors: Impulsive Cognition Arousal/Alertness: Awake/alert Behavior During Therapy: Impulsive Overall Cognitive Status: Impaired/Different from baseline Area of Impairment: Orientation, Attention, Memory, Safety/judgement, Problem solving Orientation Level: Disoriented to, Situation Current Attention Level: Sustained Memory: Decreased recall of precautions, Decreased short-term memory Safety/Judgement: Decreased awareness of safety, Decreased awareness of deficits Problem Solving: Slow processing, Requires verbal cues, Requires tactile cues General Comments: Lt  inattention/neglect; states hes not really having hard time with anything since his stroke  Physical Exam: Blood pressure 134/94, pulse 108, temperature 97.9 F (36.6 C), temperature source Oral, resp. rate 18, height _0  (1.854 m), weight 85.1 kg (187 lb 9.8 oz), SpO2 98 %. Physical Exam Constitutional: He appears well-developed and well-nourished.  HENT:  Head: Normocephalic and atraumatic.  Eyes: EOM are normal. Right eye exhibits no discharge. Left eye exhibits no discharge.  Neck: Normal range of motion. Neck supple. No thyromegaly present.  Cardiovascular: Normal rate and regular rhythm.  Respiratory: Effort normal and breath sounds normal. No respiratory distress.  GI: Soft. Bowel sounds are normal. He exhibits no distension.  Musculoskeletal: He exhibits no edema or tenderness.  Neurological: He is alert.  Mood is flat but appropriate.  He is able to provide his name, Age as well as place. Poor insight and awareness. Memory fair. Decreased problem solving. He follows simple commands. DTRs symmetric Motor: Right upper extremity/right lower extremity: 5/5 proximal to distal Left upper extremity: 4/5 delt, bicep, wrist, tricep, HI with motor apraxia and ?inattention,  Left lower extremity: 3/5 HF, KE, ADF/PF with apraxia and ?inattention  -decreased sense of pain LUE and LLE but inconsistent. Skin: Skin is warm and dry.  Psychiatric: He has a normal mood and affect. His behavior is normal   Results for orders placed or performed during the hospital encounter of 03/05/16 (from the past 48 hour(s))  Comprehensive metabolic panel     Status: Abnormal   Collection Time: 03/11/16  4:35 AM  Result Value Ref Range   Sodium 131 (L) 135 - 145 mmol/L   Potassium 4.2 3.5 - 5.1 mmol/L   Chloride 100 (L) 101 - 111 mmol/L   CO2 24 22 - 32 mmol/L   Glucose, Bld 248 (H) 65 - 99 mg/dL   BUN 8 6 - 20 mg/dL   Creatinine, Ser 0.63 0.61 - 1.24 mg/dL   Calcium 9.1 8.9 - 10.3 mg/dL   Total  Protein 6.4 (L) 6.5 - 8.1 g/dL   Albumin 2.8 (L) 3.5 - 5.0 g/dL   AST 67 (H) 15 - 41 U/L   ALT 253 (H) 17 - 63 U/L   Alkaline Phosphatase 103 38 - 126 U/L   Total Bilirubin 0.7 0.3 - 1.2 mg/dL   GFR calc non Af Amer >60 >60 mL/min   GFR calc Af Amer >60 >60 mL/min    Comment: (NOTE) The eGFR has been calculated using the CKD EPI equation. This calculation has not been validated in all clinical situations. eGFR's persistently <60 mL/min signify possible Chronic Kidney Disease.    Anion gap 7 5 - 15  CBC     Status: Abnormal   Collection Time: 03/11/16  4:35 AM  Result Value Ref Range   WBC 10.3 4.0 -  10.5 K/uL   RBC 3.28 (L) 4.22 - 5.81 MIL/uL   Hemoglobin 9.5 (L) 13.0 - 17.0 g/dL   HCT 29.9 (L) 39.0 - 52.0 %   MCV 91.2 78.0 - 100.0 fL   MCH 29.0 26.0 - 34.0 pg   MCHC 31.8 30.0 - 36.0 g/dL   RDW 17.8 (H) 11.5 - 15.5 %   Platelets 359 150 - 400 K/uL  Hemoglobin A1c     Status: None   Collection Time: 03/11/16  4:19 PM  Result Value Ref Range   Hgb A1c MFr Bld 5.6 4.8 - 5.6 %    Comment: (NOTE)         Pre-diabetes: 5.7 - 6.4         Diabetes: >6.4         Glycemic control for adults with diabetes: <7.0    Mean Plasma Glucose 114 mg/dL    Comment: (NOTE) Performed At: [facility] [place], [place] [id number] Lindon Romp MD PO:[id number]   Glucose, capillary     Status: Abnormal   Collection Time: 03/11/16  4:23 PM  Result Value Ref Range   Glucose-Capillary 330 (H) 65 - 99 mg/dL  Glucose, capillary     Status: Abnormal   Collection Time: 03/11/16 10:11 PM  Result Value Ref Range   Glucose-Capillary 220 (H) 65 - 99 mg/dL   Comment 1 Notify RN    Comment 2 Document in Chart   Comprehensive metabolic panel     Status: Abnormal   Collection Time: 03/12/16  5:10 AM  Result Value Ref Range   Sodium 130 (L) 135 - 145 mmol/L   Potassium 3.8 3.5 - 5.1 mmol/L   Chloride 99 (L) 101 - 111 mmol/L   CO2 24 22 - 32 mmol/L   Glucose, Bld 201 (H) 65  - 99 mg/dL   BUN 7 6 - 20 mg/dL   Creatinine, Ser 0.63 0.61 - 1.24 mg/dL   Calcium 8.9 8.9 - 10.3 mg/dL   Total Protein 5.9 (L) 6.5 - 8.1 g/dL   Albumin 2.6 (L) 3.5 - 5.0 g/dL   AST 52 (H) 15 - 41 U/L   ALT 186 (H) 17 - 63 U/L   Alkaline Phosphatase 100 38 - 126 U/L   Total Bilirubin 0.5 0.3 - 1.2 mg/dL   GFR calc non Af Amer >60 >60 mL/min   GFR calc Af Amer >60 >60 mL/min    Comment: (NOTE) The eGFR has been calculated using the CKD EPI equation. This calculation has not been validated in all clinical situations. eGFR's persistently <60 mL/min signify possible Chronic Kidney Disease.    Anion gap 7 5 - 15  CBC     Status: Abnormal   Collection Time: 03/12/16  5:10 AM  Result Value Ref Range   WBC 10.1 4.0 - 10.5 K/uL   RBC 2.84 (L) 4.22 - 5.81 MIL/uL   Hemoglobin 8.2 (L) 13.0 - 17.0 g/dL   HCT 25.9 (L) 39.0 - 52.0 %   MCV 91.2 78.0 - 100.0 fL   MCH 28.9 26.0 - 34.0 pg   MCHC 31.7 30.0 - 36.0 g/dL   RDW 17.5 (H) 11.5 - 15.5 %   Platelets 403 (H) 150 - 400 K/uL  Glucose, capillary     Status: Abnormal   Collection Time: 03/12/16  6:55 AM  Result Value Ref Range   Glucose-Capillary 199 (H) 65 - 99 mg/dL   Comment 1 Notify RN    Comment  2 Document in Chart   Glucose, capillary     Status: Abnormal   Collection Time: 03/12/16 11:13 AM  Result Value Ref Range   Glucose-Capillary 226 (H) 65 - 99 mg/dL  Glucose, capillary     Status: Abnormal   Collection Time: 03/12/16  5:02 PM  Result Value Ref Range   Glucose-Capillary 262 (H) 65 - 99 mg/dL   No results found.     Medical Problem List and Plan: 1.  Altered mental status with right sided weakness secondary to right MCA territory infarctAs well as history of CVA September 2013 with residual left-sided weakness  -admit to inpatient rehab 2.  DVT Prophylaxis/Anticoagulation: SCDs. Monitor for any signs of DVT 3. Pain Management: Tylenol as needed 4. Mood: BuSpar 5 mg twice a day 5. Neuropsych: This patient is capable  of making decisions on his own behalf. 6. Skin/Wound Care: Routine skin checks 7. Fluids/Electrolytes/Nutrition: Routine I&O with follow-up chemistries upon admit 8. Upper GI bleed. Follow-up per gastroenterology. Continue Protonix/follow-up CBC. . 9. Ischemic cardiac heart disease. No chest pain or shortness of breath. Aspirin 81 mg daily. No Plavix at this time due to GI bleed 10. Hypertension. Corgard 40 mg daily. Follow with increased mobility 11. Hyponatremia. 125-131. Follow-up chemistries on admit 12. Diabetes mellitus. Hemoglobin A1c 5.6. Lantus insulin 5 units daily. Check blood sugars before meals and at bedtime. Patient diet controlled prior to admission. 13. Pancreatitis. Continue Creon 24,000 units 3 times a day 14. Alcohol tobacco abuse. Nicoderm patch. Provide counseling 15. Gram-negative rod UTI. Ceftin 500 mg twice daily 03/12/2016 5 more days and stop  Post Admission Physician Evaluation: 1. Functional deficits secondary  to right MCA infarct. 2. Patient is admitted to receive collaborative, interdisciplinary care between the physiatrist, rehab nursing staff, and therapy team. 3. Patient's level of medical complexity and substantial therapy needs in context of that medical necessity cannot be provided at a lesser intensity of care such as a SNF. 4. Patient has experienced substantial functional loss from his/her baseline which was documented above under the "Functional History" and "Functional Status" headings.  Judging by the patient's diagnosis, physical exam, and functional history, the patient has potential for functional progress which will result in measurable gains while on inpatient rehab.  These gains will be of substantial and practical use upon discharge  in facilitating mobility and self-care at the household level. 5. Physiatrist will provide 24 hour management of medical needs as well as oversight of the therapy plan/treatment and provide guidance as appropriate  regarding the interaction of the two. 6. 24 hour rehab nursing will assist with bladder management, bowel management, safety, skin/wound care, disease management, medication administration, pain management and patient education  and help integrate therapy concepts, techniques,education, etc. 7. PT will assess and treat for/with: Lower extremity strength, range of motion, stamina, balance, functional mobility, safety, adaptive techniques and equipment, NMR, visual-spatial awareness, cognitive behavioral rx.   Goals are: supervision to min assist.. 8. OT will assess and treat for/with: ADL's, functional mobility, safety, upper extremity strength, adaptive techniques and equipment, NMR, visual-spatial awareness, cognitive behavioral rx, family ed.   Goals are: supervision to min assist. Therapy may proceed with showering this patient. 9. SLP will assess and treat for/with: cognition communication and education.  Goals are: supervision to min assist. 10. Case Management and Social Worker will assess and treat for psychological issues and discharge planning. 11. Team conference will be held weekly to assess progress toward goals and to determine barriers to discharge.  12. Patient will receive at least 3 hours of therapy per day at least 5 days per week. 13. ELOS: 18-22 days       14. Prognosis:  excellent     Meredith Staggers, MD, [place] [facility] 03/12/2016

## 2016-03-13 ENCOUNTER — Inpatient Hospital Stay (HOSPITAL_COMMUNITY): Payer: Medicaid Other | Admitting: Occupational Therapy

## 2016-03-13 ENCOUNTER — Inpatient Hospital Stay (HOSPITAL_COMMUNITY): Payer: Medicaid Other | Admitting: Speech Pathology

## 2016-03-13 ENCOUNTER — Inpatient Hospital Stay (HOSPITAL_COMMUNITY): Payer: Medicaid Other | Admitting: Physical Therapy

## 2016-03-13 DIAGNOSIS — N390 Urinary tract infection, site not specified: Secondary | ICD-10-CM

## 2016-03-13 DIAGNOSIS — E119 Type 2 diabetes mellitus without complications: Secondary | ICD-10-CM

## 2016-03-13 DIAGNOSIS — K922 Gastrointestinal hemorrhage, unspecified: Secondary | ICD-10-CM

## 2016-03-13 LAB — COMPREHENSIVE METABOLIC PANEL
ALT: 210 U/L — AB (ref 17–63)
ANION GAP: 7 (ref 5–15)
AST: 104 U/L — ABNORMAL HIGH (ref 15–41)
Albumin: 2.4 g/dL — ABNORMAL LOW (ref 3.5–5.0)
Alkaline Phosphatase: 117 U/L (ref 38–126)
BUN: 7 mg/dL (ref 6–20)
CHLORIDE: 101 mmol/L (ref 101–111)
CO2: 25 mmol/L (ref 22–32)
CREATININE: 0.59 mg/dL — AB (ref 0.61–1.24)
Calcium: 9.1 mg/dL (ref 8.9–10.3)
Glucose, Bld: 179 mg/dL — ABNORMAL HIGH (ref 65–99)
POTASSIUM: 4.2 mmol/L (ref 3.5–5.1)
SODIUM: 133 mmol/L — AB (ref 135–145)
Total Bilirubin: 0.6 mg/dL (ref 0.3–1.2)
Total Protein: 6.4 g/dL — ABNORMAL LOW (ref 6.5–8.1)

## 2016-03-13 LAB — CBC WITH DIFFERENTIAL/PLATELET
Basophils Absolute: 0 10*3/uL (ref 0.0–0.1)
Basophils Relative: 0 %
EOS ABS: 0.1 10*3/uL (ref 0.0–0.7)
EOS PCT: 1 %
HCT: 25.7 % — ABNORMAL LOW (ref 39.0–52.0)
Hemoglobin: 8.2 g/dL — ABNORMAL LOW (ref 13.0–17.0)
LYMPHS ABS: 0.9 10*3/uL (ref 0.7–4.0)
LYMPHS PCT: 9 %
MCH: 28.6 pg (ref 26.0–34.0)
MCHC: 31.9 g/dL (ref 30.0–36.0)
MCV: 89.5 fL (ref 78.0–100.0)
MONO ABS: 1.5 10*3/uL — AB (ref 0.1–1.0)
MONOS PCT: 15 %
Neutro Abs: 7.5 10*3/uL (ref 1.7–7.7)
Neutrophils Relative %: 75 %
PLATELETS: 482 10*3/uL — AB (ref 150–400)
RBC: 2.87 MIL/uL — ABNORMAL LOW (ref 4.22–5.81)
RDW: 17 % — AB (ref 11.5–15.5)
WBC: 10 10*3/uL (ref 4.0–10.5)

## 2016-03-13 LAB — GLUCOSE, CAPILLARY
GLUCOSE-CAPILLARY: 213 mg/dL — AB (ref 65–99)
GLUCOSE-CAPILLARY: 178 mg/dL — AB (ref 65–99)
GLUCOSE-CAPILLARY: 274 mg/dL — AB (ref 65–99)

## 2016-03-13 MED ORDER — TRAMADOL HCL 50 MG PO TABS
50.0000 mg | ORAL_TABLET | Freq: Four times a day (QID) | ORAL | Status: DC | PRN
  Administered 2016-03-13 – 2016-03-28 (×22): 50 mg via ORAL
  Filled 2016-03-13 (×22): qty 1

## 2016-03-13 NOTE — IPOC Note (Addendum)
Overall Plan of Care [facility](IPOC) Patient Details Name: Brandon [name] MRN: [id number] DOB: 02-20-1966  Admitting Diagnosis: [facility] CVA  Hospital Problems: Principal Problem:   Right middle cerebral artery stroke [facility](HCC) Active Problems:   Chronic Pancreatitis.   Upper GI bleed   Essential hypertension, benign     Functional Problem List: Nursing Behavior, Bladder, Bowel, Endurance, Medication Management, Motor, Nutrition, Safety  PT Balance, Behavior, Motor, Endurance, Pain, Perception, Safety, Sensory  OT Balance  SLP Cognition, Safety, Behavior  TR         Basic ADL's: OT Eating, Grooming, Bathing, Dressing, Toileting     Advanced  ADL's: OT Laundry     Transfers: PT Bed Mobility, Bed to Chair, Physiological scientisturniture, Customer service managerCar  OT Toilet, Research scientist (life sciences)Tub/Shower     Locomotion: PT Ambulation, Psychologist, prison and probation servicesWheelchair Mobility, Stairs     Additional Impairments: OT Fuctional Use of Upper Extremity  SLP Social Cognition   Problem Solving, Memory, Attention, Awareness  TR      Anticipated Outcomes Item Anticipated Outcome  Self Feeding independent  Swallowing      Basic self-care  Supervision to mod I  Toileting  supervision to mod I   Bathroom Transfers supervision to mod I  Bowel/Bladder  Pt will manage bowel and bladder with total assist   Transfers  supervision  Locomotion  supervision with LRAD  Communication     Cognition  Supervision   Pain  Pt will rate pain at 4 or less on a scale of 0-10.   Safety/Judgment  Pt will remain free of falls and injury with mod assist    Therapy Plan: PT Intensity: Minimum of 1-2 x/day ,45 to 90 minutes PT Frequency: 5 out of 7 days PT Duration Estimated Length of Stay: 14-20 days OT Intensity: Minimum of 1-2 x/day, 45 to 90 minutes OT Frequency: 5 out of 7 days OT Duration/Estimated Length of Stay: 10-14 days SLP Intensity: Minumum of 1-2 x/day, 30 to 90 minutes SLP Frequency: 3 to 5 out of 7 days SLP Duration/Estimated Length of Stay: 18-22 days        Team Interventions: Nursing Interventions Patient/Family Education, Bladder Management, Bowel Management, Disease Management/Prevention, Medication Management, Cognitive Remediation/Compensation, Dysphagia/Aspiration Precaution Training, Discharge Planning  PT interventions Ambulation/gait training, Discharge planning, Functional mobility training, Psychosocial support, Therapeutic Activities, Visual/perceptual remediation/compensation, Wheelchair propulsion/positioning, Therapeutic Exercise, Neuromuscular re-education, Disease management/prevention, Warden/rangerBalance/vestibular training, Cognitive remediation/compensation, DME/adaptive equipment instruction, Pain management, Splinting/orthotics, UE/LE Strength taining/ROM, UE/LE Coordination activities, Stair training, Patient/family education, Community reintegration  OT Interventions Warden/rangerBalance/vestibular training, FirefighterCommunity reintegration, Discharge planning, Disease mangement/prevention, Fish farm managerDME/adaptive equipment instruction, Functional mobility training, Neuromuscular re-education, Pain management, Patient/family education, Psychosocial support, Self Care/advanced ADL retraining, Skin care/wound managment, Splinting/orthotics, Therapeutic Activities, Therapeutic Exercise, UE/LE Strength taining/ROM, UE/LE Coordination activities, Wheelchair propulsion/positioning  SLP Interventions Cognitive remediation/compensation, Cueing hierarchy, Medication managment, Functional tasks, Internal/external aids, Environmental controls, Patient/family education, Therapeutic Activities  TR Interventions    SW/CM Interventions Discharge Planning, Psychosocial Support, Patient/Family Education    Team Discharge Planning: Destination: PT-Home ,OT- Home , SLP-Home Projected Follow-up: PT-Home health PT, 24 hour supervision/assistance, OT-  Home health OT, 24 hour supervision/assistance, SLP-24 hour supervision/assistance, Other (comment) (TBD based upon family's report of  baseline) Projected Equipment Needs: PT-Rolling walker with 5" wheels, OT- 3 in 1 bedside comode, Tub/shower bench, SLP-None recommended by SLP Equipment Details: PT- , OT-  Patient/family involved in discharge planning: PT- Patient,  OT-Patient, SLP-Patient  MD ELOS: 12-15 days Medical Rehab Prognosis:  Good Assessment: 50 y.o. 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  Plans to stay with his girlfriend in [place] who also works part time. Presented 03/05/2016 with altered mental status and easily fatigued. He was found by a friend lying on the couch confused covered in feces and urine. MRI of the brain showed acute infarcts scattered in the right middle cerebral artery territory. CT angiogram of head and neck showed chronic occlusion right ICA. Left carotid stent was patent. Occlusion proximal right vertebral artery. Echocardiogram with ejection fraction of 55-60% no wall motion abnormalities. Patient did not receive TPA. Ultrasound the abdomen showed no focal lesions or ascites. 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 Findings of hemoglobin 4.8 and transfused. Underwent upper GI endoscopy 03/08/2016 per Dr. Adela Lank with findings of large gastric varices without bleeding. Low-dose aspirin 81 mg daily resumed 03/12/2016. No plans to resume Plavix at this time. Full liquid diet Advanced to mechanical soft. Urine culture greater than 100,000 Klebsiella maintained on Rocephin changed to Ceftin 500 mg twice daily 5 days 03/12/2016. Hyponatremia monitored.  Patient with resultant functional deficits due to altered mental status, increased left-sided weakness, and posterior gait abnormality. Will set goals for supervision/modified I  with therapies.   See Team Conference Notes for weekly updates to the plan of care

## 2016-03-13 NOTE — Evaluation (Signed)
Occupational Therapy Assessment and Plan & Session Notes  Patient Details  Name: Brandon [name] MRN: [id number] Date of Birth: Feb 23, 1966  OT Diagnosis: abnormal posture, cognitive deficits, hemiplegia affecting non-dominant side and muscle weakness (generalized) Rehab Potential: Rehab Potential (ACUTE ONLY): Good ELOS: 10-14 days   Today's Date: 03/13/2016   Problem List:  Patient Active Problem List   Diagnosis Date Noted  . Upper GI bleed   . Right middle cerebral artery stroke ([place]) 03/12/2016  . Fatty liver   . Tobacco abuse   . Diabetes mellitus type 2 in nonobese (HCC)   . History of CVA with residual deficit   . Acute lower UTI   . Tachycardia   . Chronic alcoholic pancreatitis ([place])   . Hyponatremia 03/09/2016  . 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  . Acute blood loss anemia 03/09/2016  . Gastric varices   . Alcohol abuse   . Left-sided neglect   . Severe anemia   . UGIB (upper gastrointestinal bleed)   . Pressure ulcer 03/06/2016  . Acute encephalopathy   . CVA (cerebral infarction) 03/05/2016  . Carotid stenosis 04/06/2015  . CAD in native artery 03/16/2015  . Unstable angina pectoris ([place]) 03/15/2015  . Neck pain 10/20/2013  . Insomnia 12/29/2012  . Dissection of carotid artery ([place]) 12/11/2012  . Occlusion and stenosis of carotid artery with cerebral infarction 12/11/2012  . Unspecified cerebral artery occlusion with cerebral infarction 12/11/2012  . Fatigue 11/26/2012  . Hallux valgus 06/25/2012  . Preventative health care 06/25/2012  . Alcohol Dependence 06/18/2012  . Smoking 06/16/2012  . Bilateral extracranial carotid artery stenosis   . Coronary Artery Disease 06/13/2012  . Ischemic Stroke 06/13/2012  . Hyperlipidemia   . Hypertension 02/25/2011  . GERD (gastroesophageal reflux disease) 02/25/2011  . Chronic Pancreatitis. 02/25/2011  . Hepatic steatosis 02/25/2011    Past Medical History:  Past Medical  History  Diagnosis Date  . Alcohol dependency ([place])     Hx ETOH withdrawal seizures before 2011  . Pancreatitis 2011....     CT findings in May 2011 with inflammation and pseudocyst.  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/2016  . GERD (gastroesophageal reflux disease)   . CAD (coronary artery disease) 06/13/2012    Calcification noted on CTA of chest in 2012 Wall motion abnormality on ECHO    . 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   Marland Kitchen Hyperlipidemia   . Headache(784.0)     migraine  . Heart disease 02/2015    PCI/DES placed to RCA: on chronic Plavix/ASA  . Depression with anxiety   . Hypertension   . Carotid artery disease ([place]) 2016    bilateral.  s/p left carotid stent 03/2015  . Renal artery stenosis, native, bilateral ([place]) 02/2015   Past Surgical History:  Past Surgical History  Procedure Laterality Date  . None    . Carotid angiogram N/A 06/15/2012    Procedure: CAROTID ANGIOGRAM;  Surgeon: Angelia Mould, MD;  Location[facility] C[facility] LAB;  Service: Cardiovascular;  Laterality: N/A;  . Cardiac catheterization N/A 03/15/2015    Procedure: Left Heart Cath;  Surgeon: Dionisio David, MD;  Location: [place] CV LAB;  Service: Cardiovascular;  Laterality: N/A;  . Cardiac catheterization N/A 03/16/2015    Procedure: Coronary Stent Intervention;  Surgeon: Yolonda Kida, MD;  Location: [place] CV LAB;  Service: Cardiovascular;  Laterality: N/A;  . Peripheral vascular catheterization  Left 04/06/2015    Procedure: Carotid PTA/Stent Intervention;  Surgeon: Algernon Huxley, MD;  Location: [place] CV LAB;  Service: Cardiovascular;  Laterality: Left;  . Esophagogastroduodenoscopy N/A 03/08/2016    Procedure: ESOPHAGOGASTRODUODENOSCOPY (EGD);  Surgeon: Manus Gunning, MD;  Location: [place];  Service: Gastroenterology;  Laterality: N/A;    Assessment & Plan Clinical Impression: Brandon [name] is a 50 y.o.  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 Patient lives with sister. Reportedly independent prior to admission. Sister does work during the day. Plans to stay with his girlfriend in [place] who also works part time. Presented 03/05/2016 with altered mental status and easily fatigued. He was found by a friend lying on the couch confused covered in feces and urine. MRI of the brain showed acute infarcts scattered in the right middle cerebral artery territory. CT angiogram of head and neck showed chronic occlusion right ICA. Left carotid stent was patent. Occlusion proximal right vertebral artery. Echocardiogram with ejection fraction of 55-60% no wall motion abnormalities. Patient did not receive TPA. Ultrasound the abdomen showed no focal lesions or ascites. 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 Findings of hemoglobin 4.8 and transfused. Underwent upper GI endoscopy 03/08/2016 per Dr. Havery Moros with findings of large gastric varices without bleeding. Currently on no anticoagulation due to GI bleed. Full liquid diet. Urine culture greater than 100,000 gram-negative rods maintained on Rocephin. Hyponatremia 125-131 and monitored. Physical therapy evaluation completed 03/09/2016 with recommendations of physical medicine rehabilitation consult. Patient transferred to CIR on 03/12/2016 .    Patient currently requires min to mod assist +2 with basic self-care skills and IADL secondary to muscle weakness and muscle joint tightness, abnormal tone, motor apraxia, ataxia, decreased coordination and decreased motor planning, decreased initiation, decreased attention, decreased awareness, decreased problem solving, decreased safety awareness,  decreased memory and delayed processing and decreased sitting balance, decreased standing balance, decreased postural control, hemiplegia and decreased balance strategies.  Prior to hospitalization, patient could complete ADL and IADLs with independent .  Patient will benefit from skilled intervention to increase independence with basic self-care skills prior to discharge home with care partner.  Anticipate patient will require 24 hour supervision and follow up home health.  OT - End of Session Activity Tolerance: Tolerates 30+ min activity with multiple rests OT Assessment Rehab Potential (ACUTE ONLY): Good Barriers to Discharge:  (none known at this time) OT Patient demonstrates impairments in the following area(s): Balance OT Basic ADL's Functional Problem(s): Eating;Grooming;Bathing;Dressing;Toileting OT Advanced ADL's Functional Problem(s): Laundry OT Transfers Functional Problem(s): Toilet;Tub/Shower OT Additional Impairment(s): Fuctional Use of Upper Extremity OT Plan OT Intensity: Minimum of 1-2 x/day, 45 to 90 minutes OT Frequency: 5 out of 7 days OT Duration/Estimated Length of Stay: 10-14 days OT Treatment/Interventions: Medical illustrator training;Community reintegration;Discharge planning;Disease mangement/prevention;DME/adaptive equipment instruction;Functional mobility training;Neuromuscular re-education;Pain management;Patient/family education;Psychosocial support;Self Care/advanced ADL retraining;Skin care/wound managment;Splinting/orthotics;Therapeutic Activities;Therapeutic Exercise;UE/LE Strength taining/ROM;UE/LE Coordination activities;Wheelchair propulsion/positioning OT Self Feeding Anticipated Outcome(s): independent OT Basic Self-Care Anticipated Outcome(s): Supervision to mod I OT Toileting Anticipated Outcome(s): supervision to mod I OT Bathroom Transfers Anticipated Outcome(s): supervision to mod I OT Recommendation Recommendations for Other Services:  (none at  this time) Patient destination: Home Follow Up Recommendations: Home health OT;24 hour supervision/assistance Equipment Recommended: 3 in 1 bedside comode;Tub/shower bench  Skilled Therapeutic Intervention  Session #1 0800-0900 - 60 Minutes Individual Therapy No complaints of pain Initial 1:1 OT evaluation completed. Focused skilled intervention on dynamic sitting balance, functional use of LUE during self-feeding, UB/LB dressing, sit to/from stand, stand pivot transfer to recliner. Pt requires increased time and +2 for safety at this time. At end of session, left pt seated in recliner with quick release belt donned and all needs within reach.   Session #2 1100-1130 - 30 Minutes Individual Therapy No complaints of pain Pt found seated in recliner. Assisted pt to sink, pt with increased difficulty managing and maneuvering w/c. Pt sat at sink for grooming tasks of brushing teeth, combing hair, and washing hands. Discussed goals and plan of care with patient. Worked with patient on grasp and release exercises to L hand, minimal difficulty with this. Showed patient the bathroom in his room and explained that he would be using the STEDY lift with nursing to begin with. Explained role of OT and how OT will be assisting with his care and independence during this rehab stay. At end of session, left pt seated in w/c with all needs within reach and quick release belt donned.   OT Evaluation Precautions/Restrictions  Precautions Precautions: Fall Restrictions Weight Bearing Restrictions: No   General Chart Reviewed: Yes Family/Caregiver Present: No   Vital Signs Therapy Vitals Temp: 98.2 F (36.8 C) Temp Source: Oral Pulse Rate: 74 Resp: 18 BP: 121/76 mmHg Patient Position (if appropriate): Lying Oxygen Therapy SpO2: 100 % O2 Device: Not Delivered   Pain Pain Assessment Pain Assessment: No/denies pain   Home Living/Prior Functioning Home Living Family/patient expects to be  discharged to:: Private residence Living Arrangements:  (sister) Available Help at Discharge: Friend(s), Available 24 hours/day (pt states girlfriend and sister will arrange 24/7) Type of Home: House Home Access: Stairs to enter Secretary/administrator of Steps: 5 Entrance Stairs-Rails: Right, Left Home Layout: Two level, Able to live on main level with bedroom/bathroom Alternate Level Stairs-Number of Steps: flight Bathroom Shower/Tub: Tub/shower unit, Buyer, retail: Yes Additional Comments: states house is handicapped accessible  Lives With: Family (for past 9 months) IADL History Homemaking Responsibilities: Yes Meal Prep Responsibility: Secondary Laundry Responsibility: Primary Cleaning Responsibility: Primary Shopping Responsibility: Secondary Child Care Responsibility: No Current License: No Mode of Transportation:  (bicycle) Occupation: Unemployed Leisure and Hobbies: bowl, softball, basketball Prior Function Level of Independence: Independent with basic ADLs, Independent with gait Driving: No Vocation: Unemployed   ADL - See FIM  Vision/Perception  Vision- History Baseline Vision/History: Wears glasses Wears Glasses: Reading only Patient Visual Report: Blurring of vision ("tad" blurry) Vision- Assessment Vision Assessment?: No apparent visual deficits Additional Comments: Pt able to find items on left visual field, no peripheral vision loss noted during testing. May want to continue to assess in functional context.  Praxis Praxis-Other Comments: ? limb apraxia   Cognition Overall Cognitive Status: Impaired/Different from baseline Arousal/Alertness: Awake/alert Orientation Level: Person;Place;Situation Person: Oriented Place: Oriented Situation: Oriented Year: 2017 Month: March (able to state month with in questioning cues) Day of Week: Correct Memory: Impaired Memory Impairment: Storage deficit Immediate Memory  Recall: Sock;Blue;Bed Memory Recall: Blue;Bed;Sock Memory Recall Sock: With Cue Memory Recall Blue: Without Cue Memory Recall Bed: Without Cue Attention: Sustained Sustained Attention: Impaired Sustained Attention Impairment: Verbal basic Problem Solving: Impaired Problem Solving Impairment: Functional basic Executive Function: Initiating;Organizing;Sequencing Sequencing: Impaired Organizing: Impaired Initiating: Impaired Safety/Judgment: Impaired   Sensation Sensation Light Touch: Impaired by gross assessment (LUE) Stereognosis: Impaired by  gross assessment (LUE) Hot/Cold: Impaired by gross assessment (LUE) Proprioception: Impaired by gross assessment (LUE) Coordination Gross Motor Movements are Fluid and Coordinated: No Fine Motor Movements are Fluid and Coordinated: No   Motor  Motor Motor - Skilled Clinical Observations:  (L side weakness (UE & LE), impaired coordination LLE)   Mobility  Bed Mobility Bed Mobility: Rolling Right;Rolling Left;Supine to Sit;Sit to Supine Rolling Right: 3: Mod assist Rolling Left: 4: Min guard Supine to Sit: 4: Min assist (LLE) Sit to Supine: 4: Min guard (LLE) Transfers Sit to Stand: 2: Max assist Stand to Sit: 2: Max Economist Assessed: Yes Static Sitting Balance Static Sitting - Balance Support: Bilateral upper extremity supported Static Sitting - Level of Assistance: 5: Stand by assistance   Extremity/Trunk Assessment RUE Assessment RUE Assessment: Within Functional Limits LUE Assessment LUE Assessment: Exceptions to WFL LUE AROM (degrees) Overall AROM Left Upper Extremity: Deficits (stiffness, pt unable to fully flex LUE) LUE PROM (degrees) Overall PROM Left Upper Extremity: Deficits (stiffness, therapist unable to perform full ROM passively to LUE) LUE Strength LUE Overall Strength Comments: Decreased overall strength throughout LUEm, grossly 3+/5   See Function Navigator for Current  Functional Status.   Refer to Care Plan for Long Term Goals  Recommendations for other services: None  Discharge Criteria: Patient will be discharged from OT if patient refuses treatment 3 consecutive times without medical reason, if treatment goals not met, if there is a change in medical status, if patient makes no progress towards goals or if patient is discharged from hospital.  The above assessment, treatment plan, treatment alternatives and goals were discussed and mutually agreed upon: by patient  Chrys Racer , MS, OTR/L, CLT  03/13/2016, 1:01 PM

## 2016-03-13 NOTE — Evaluation (Signed)
Speech Language Pathology Assessment and Plan  Patient Details  Name: Brandon [name] MRN: [id number] Date of Birth: 03/21/1966  SLP Diagnosis: Cognitive Impairments  Rehab Potential: Good ELOS: 18-22 days    Today's Date: 03/13/2016 SLP Individual Time: 1300-1400 SLP Individual Time Calculation (min): 60 min   Problem List:  Patient Active Problem List   Diagnosis Date Noted  . Upper GI bleed   . 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 Fatty liver   . Tobacco abuse   . Diabetes mellitus type 2 in nonobese (HCC)   . History of CVA with residual deficit   . Acute lower UTI   . Tachycardia   . Chronic alcoholic pancreatitis ([place])   . Hyponatremia 03/09/2016  . 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  . Acute blood loss anemia 03/09/2016  . Gastric varices   . Alcohol abuse   . Left-sided neglect   . Severe anemia   . UGIB (upper gastrointestinal bleed)   . Pressure ulcer 03/06/2016  . Acute encephalopathy   . CVA (cerebral infarction) 03/05/2016  . Carotid stenosis 04/06/2015  . CAD in native artery 03/16/2015  . Unstable angina pectoris ([place]) 03/15/2015  . Neck pain 10/20/2013  . Insomnia 12/29/2012  . 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 Occlusion and stenosis of carotid artery with cerebral infarction 12/11/2012  . Unspecified cerebral artery occlusion with cerebral infarction 12/11/2012  . Fatigue 11/26/2012  . Hallux valgus 06/25/2012  . Preventative health care 06/25/2012  . Alcohol Dependence 06/18/2012  . Smoking 06/16/2012  . Bilateral extracranial carotid artery stenosis   . Coronary Artery Disease 06/13/2012  . Ischemic Stroke 06/13/2012  . Hyperlipidemia   . Hypertension 02/25/2011  . GERD (gastroesophageal reflux disease) 02/25/2011  . Chronic Pancreatitis. 02/25/2011  . Hepatic steatosis 02/25/2011   Past Medical History:  Past Medical History  Diagnosis Date  . Alcohol dependency  ([place])     Hx ETOH withdrawal seizures before 2011  . Pancreatitis 2011....     CT findings in May 2011 with inflammation and pseudocyst.  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/2016  . GERD (gastroesophageal reflux disease)   . CAD (coronary artery disease) 06/13/2012    Calcification noted on CTA of chest in 2012 Wall motion abnormality on ECHO    . 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   Marland Kitchen Hyperlipidemia   . Headache(784.0)     migraine  . Heart disease 02/2015    PCI/DES placed to RCA: on chronic Plavix/ASA  . Depression with anxiety   . Hypertension   . Carotid artery disease ([place]) 2016    bilateral.  s/p left carotid stent 03/2015  . Renal artery stenosis, native, bilateral ([place]) 02/2015   Past Surgical History:  Past Surgical History  Procedure Laterality Date  . None    . Carotid angiogram N/A 06/15/2012    Procedure: CAROTID ANGIOGRAM;  Surgeon: Angelia Mould, MD;  Location[facility] CATH LAB;  Service: Cardiovascular;  Laterality: N/A;  . Cardiac catheterization N/A 03/15/2015    Procedure: Left Heart Cath;  Surgeon: Dionisio David, MD;  Location: [place] CV LAB;  Service: Cardiovascular;  Laterality: N/A;  . Cardiac catheterization N/A 03/16/2015    Procedure: Coronary Stent Intervention;  Surgeon: Yolonda Kida, MD;  Location: [place] CV LAB;  Service: Cardiovascular;  Laterality: N/A;  . Peripheral vascular catheterization Left 04/06/2015  Procedure: Carotid PTA/Stent Intervention;  Surgeon: Algernon Huxley, MD;  Location: [place] CV LAB;  Service: Cardiovascular;  Laterality: Left;  . Esophagogastroduodenoscopy N/A 03/08/2016    Procedure: ESOPHAGOGASTRODUODENOSCOPY (EGD);  Surgeon: Manus Gunning, MD;  Location: [facility] [facility];  Service: Gastroenterology;  Laterality: N/A;    Assessment / Plan / Recommendation Clinical Impression Brandon [name] is a 50 y.o. right handed male with history of  ischemic cardiac heart disease, pancreatitis, CVA due to right ICA occlusion September 2013 with residual left-sided weakness maintained on aspirin 81 mg daily as well as Plavix, tobacco and alcohol abuse, hypertension, left carotid stent 2016. Patient lives with sister. Reportedly independent prior to admission. Sister does work during the day. Plans to stay with his girlfriend in [place] who also works part time. Presented 03/05/2016 with altered mental status and easily fatigued. He was found by a friend lying on the couch confused covered in feces and urine. MRI of the brain showed acute infarcts scattered in the right middle cerebral artery territory. CT angiogram of head and neck showed chronic occlusion right ICA. Left carotid stent was patent. Occlusion proximal right vertebral artery. Echocardiogram with ejection fraction of 55-60% no wall motion abnormalities. Patient did not receive TPA. Ultrasound the abdomen showed no focal lesions or ascites. 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 Findings of hemoglobin 4.8 and transfused. Underwent upper GI endoscopy 03/08/2016 per Dr. Havery Moros with findings of large gastric varices without bleeding. Low-dose aspirin 81 mg daily resumed 03/12/2016. No plans to resume Plavix at this time. Full liquid diet Advanced to mechanical soft. Urine culture greater than 100,000 gram-negative rods maintained on Rocephin changed to Ceftin 500 mg twice daily 5 days 03/12/2016. Hyponatremia 125-131 and monitored. Physical therapy evaluation completed 03/09/2016 with recommendations of physical medicine rehabilitation consult.  Patient was admitted to [facility] 03/12/16 and demonstrates moderate cognitive impairments characterized by poor intellectual awareness of deficits, left attention, recall, functional problem solving, and overall safety, which impact the patient's overall safety with functional self-care  tasks. Patient also demonstrates impulsivity with verbal abilities disguising functional deficits at times. Montreal Cognitive Assessment consistent with this given a score of 25/30, which is considered WNL. Patient demonstrates mild left sided oral weakness; however, it does not appear to impact function given that speech is fully intelligible and patient demonstrated ability to masticate and orally clear regular textures.  Patient would benefit from skilled SLP intervention in order to maximize their functional independence prior to discharge. Anticipate patient will require 24 hour supervision at home and follow up SLP services.    Skilled Therapeutic Interventions          Bedside swallow and cognitive-linguistic evaluations completed with results and recommendations reviewed with patient. Skilled treatment also initiated with session focusing on addressing cognition goals. SLP facilitated session by providing Mod assist verbal cues to attend to left upper extremity during self-feeding task and Max assist verbal cues to label current deficits.  Continue with plan of care.    SLP Assessment  Patient will need skilled Speech Lanaguage Pathology Services during CIR admission    Recommendations  SLP Diet Recommendations: Dysphagia 3 (Mech soft);Thin (advance to regular as medically able to tolerate ) Liquid Administration via: Cup;Straw Medication Administration: Whole meds with liquid Supervision: Patient able to self feed;Intermittent supervision to cue for compensatory strategies Compensations: Minimize environmental distractions;Slow rate;Small sips/bites Postural Changes and/or Swallow Maneuvers: Seated upright 90 degrees;Upright 30-60 min after meal Oral Care Recommendations: Oral  care BID Patient destination: Home Follow up Recommendations: 24 hour supervision/assistance;Other (comment) (TBD based upon family's report of baseline) Equipment Recommended: None recommended by SLP    SLP  Frequency 3 to 5 out of 7 days   SLP Duration  SLP Intensity  SLP Treatment/Interventions 18-22 days  Minumum of 1-2 x/day, 30 to 90 minutes  Cognitive remediation/compensation;Cueing hierarchy;Medication managment;Functional tasks;Internal/external aids;Environmental controls;Patient/family education;Therapeutic Activities    Pain Pain Assessment Pain Assessment: No/denies pain  Prior Functioning Cognitive/Linguistic Baseline: Within functional limits (with basic reports that significant other managed money but he did meds) Type of Home: House  Lives With: Significant other;Other (Comment) (sister) Available Help at Discharge: Family;Available 24 hours/day Vocation: Other (comment) (worked odd jobs)  Function:  Eating Eating   Modified Consistency Diet: No (with minimal trials with SLP) Eating Assist Level: More than reasonable amount of time;Set up assist for;Supervision or verbal cues   Eating Set Up Assist For: Opening containers;Cutting food       Cognition Comprehension Comprehension assist level: Understands basic 90% of the time/cues < 10% of the time  Expression   Expression assist level: Expresses basic needs/ideas: With extra time/assistive device  Social Interaction Social Interaction assist level: Interacts appropriately 90% of the time - Needs monitoring or encouragement for participation or interaction.  Problem Solving Problem solving assist level: Solves basic 25 - 49% of the time - needs direction more than half the time to initiate, plan or complete simple activities  Memory Memory assist level: Recognizes or recalls 50 - 74% of the time/requires cueing 25 - 49% of the time   Short Term Goals: Week 1: SLP Short Term Goal 1 (Week 1): Patient will verbally identify 2 physical and 1 cognitive deficits post CVA with Mod assist question cues SLP Short Term Goal 2 (Week 1): Patient will request help as needed during basic and familiar problem solving takss with  Max assistmultimodal cues  SLP Short Term Goal 3 (Week 1): Patient will attend to left upper extremity and utilize during familiar tasks with Mod assist verbal cues SLP Short Term Goal 4 (Week 1): Patient will alternate attention for 5 minutes with Min assist verbal cues for redirection  SLP Short Term Goal 5 (Week 1): Patient will utilize extenral aids to assist with recall of new information with Min assist verbal cues   Refer to Care Plan for Long Term Goals  Recommendations for other services: None  Discharge Criteria: Patient will be discharged from SLP if patient refuses treatment 3 consecutive times without medical reason, if treatment goals not met, if there is a change in medical status, if patient makes no progress towards goals or if patient is discharged from hospital.  The above assessment, treatment plan, treatment alternatives and goals were discussed and mutually agreed upon: by patient  Gunnar Fusi, M.A., CCC-SLP [phone]  [place] 03/13/2016, 2:53 PM

## 2016-03-13 NOTE — Plan of Care (Signed)
Problem: RH Ambulation Goal: LTG Patient will ambulate in community environment (PT) LTG: Patient will ambulate in community environment, # of feet with assistance (PT).  50 ft with LRAD

## 2016-03-13 NOTE — Plan of Care (Signed)
Problem: RH BLADDER ELIMINATION Goal: RH STG MANAGE BLADDER WITH MEDICATION WITH ASSISTANCE STG Manage Bladder With Medication With Min Assistance.  Outcome: Not Progressing Pt incontient of bladder

## 2016-03-13 NOTE — Plan of Care (Signed)
Problem: RH BOWEL ELIMINATION Goal: RH STG MANAGE BOWEL WITH ASSISTANCE STG Manage Bowel with min Assistance.  Not progressing. Pt incontinent of bowel

## 2016-03-13 NOTE — Evaluation (Addendum)
Physical Therapy Assessment and Plan  Patient Details  Name: Brandon [name] MRN: [id number] Date of Birth: Jan 23, 1966  PT Diagnosis: Abnormality of gait, Coordination disorder, Difficulty walking, Hemiplegia non-dominant, Impaired cognition, Impaired sensation and Muscle weakness Rehab Potential: Good ELOS: 10-14 days   Today's Date: 03/13/2016 PT Individual Time: 1004-1101 PT Individual Time Calculation (min): 57 min    Problem List:  Patient Active Problem List   Diagnosis Date Noted  . Upper GI bleed   . Right middle cerebral artery stroke ([place]) 03/12/2016  . Fatty liver   . Tobacco abuse   . Diabetes mellitus type 2 in nonobese (HCC)   . History of CVA with residual deficit   . Acute lower UTI   . Tachycardia   . Chronic alcoholic pancreatitis ([place])   . Hyponatremia 03/09/2016  . 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  . Acute blood loss anemia 03/09/2016  . Gastric varices   . Alcohol abuse   . Left-sided neglect   . Severe anemia   . UGIB (upper gastrointestinal bleed)   . Pressure ulcer 03/06/2016  . Acute encephalopathy   . CVA (cerebral infarction) 03/05/2016  . Carotid stenosis 04/06/2015  . CAD in native artery 03/16/2015  . Unstable angina pectoris ([place]) 03/15/2015  . Neck pain 10/20/2013  . Insomnia 12/29/2012  . 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 Occlusion and stenosis of carotid artery with cerebral infarction 12/11/2012  . Unspecified cerebral artery occlusion with cerebral infarction 12/11/2012  . Fatigue 11/26/2012  . Hallux valgus 06/25/2012  . Preventative health care 06/25/2012  . Alcohol Dependence 06/18/2012  . Smoking 06/16/2012  . Bilateral extracranial carotid artery stenosis   . Coronary Artery Disease 06/13/2012  . Ischemic Stroke 06/13/2012  . Hyperlipidemia   . Hypertension 02/25/2011  . GERD (gastroesophageal reflux disease) 02/25/2011  . Chronic Pancreatitis. 02/25/2011  . Hepatic  steatosis 02/25/2011    Past Medical History:  Past Medical History  Diagnosis Date  . Alcohol dependency ([place])     Hx ETOH withdrawal seizures before 2011  . Pancreatitis 2011....     CT findings in May 2011 with inflammation and pseudocyst.  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/2016  . GERD (gastroesophageal reflux disease)   . CAD (coronary artery disease) 06/13/2012    Calcification noted on CTA of chest in 2012 Wall motion abnormality on ECHO    . 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   Marland Kitchen Hyperlipidemia   . Headache(784.0)     migraine  . Heart disease 02/2015    PCI/DES placed to RCA: on chronic Plavix/ASA  . Depression with anxiety   . Hypertension   . Carotid artery disease ([place]) 2016    bilateral.  s/p left carotid stent 03/2015  . Renal artery stenosis, native, bilateral ([place]) 02/2015   Past Surgical History:  Past Surgical History  Procedure Laterality Date  . None    . Carotid angiogram N/A 06/15/2012    Procedure: CAROTID ANGIOGRAM;  Surgeon: Angelia Mould, MD;  Location: [facility] CATH LAB;  Service: Cardiovascular;  Laterality: N/A;  . Cardiac catheterization N/A 03/15/2015    Procedure: Left Heart Cath;  Surgeon: Dionisio David, MD;  Location: [facility] CV LAB;  Service: Cardiovascular;  Laterality: N/A;  . Cardiac catheterization N/A 03/16/2015    Procedure: Coronary Stent Intervention;  Surgeon: Yolonda Kida, MD;  Location: [facility] CV LAB;  Service:  Cardiovascular;  Laterality: N/A;  . Peripheral vascular catheterization Left 04/06/2015    Procedure: Carotid PTA/Stent Intervention;  Surgeon: Algernon Huxley, MD;  Location: [facility] CV LAB;  Service: Cardiovascular;  Laterality: Left;  . Esophagogastroduodenoscopy N/A 03/08/2016    Procedure: ESOPHAGOGASTRODUODENOSCOPY (EGD);  Surgeon: Manus Gunning, MD;  Location: [facility];  Service: Gastroenterology;  Laterality: N/A;     Assessment & Plan Clinical Impression: Patient is a 50 y.o. year old male  with history of ischemic cardiac heart disease, pancreatitis, CVA due to right ICA occlusion September 2013 with residual left-sided weakness maintained on aspirin 81 mg daily as well as Plavix, tobacco and alcohol abuse, hypertension, left carotid stent 2016. Patient lives with sister. Reportedly independent prior to admission. Sister does work during the day. Plans to stay with his girlfriend in [place] who also works part time. Presented 03/05/2016 with altered mental status and easily fatigued. He was found by a friend lying on the couch confused covered in feces and urine. MRI of the brain showed acute infarcts scattered in the right middle cerebral artery territory. CT angiogram of head and neck showed chronic occlusion right ICA. Left carotid stent was patent. Occlusion proximal right vertebral artery. Echocardiogram with ejection fraction of 55-60% no wall motion abnormalities. Patient did not receive TPA. Ultrasound the abdomen showed no focal lesions or ascites. 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 Findings of hemoglobin 4.8 and transfused. Underwent upper GI endoscopy 03/08/2016 per Dr. Havery Moros with findings of large gastric varices without bleeding. Low-dose aspirin 81 mg daily resumed 03/12/2016. No plans to resume Plavix at this time. Full liquid diet Advanced to mechanical soft. Urine culture greater than 100,000 gram-negative rods maintained on Rocephin changed to Ceftin 500 mg twice daily 5 days 03/12/2016. Hyponatremia 125-131 and monitored. Physical therapy evaluation completed 03/09/2016 with recommendations of physical medicine rehabilitation consult.Patient was admitted for a comprehensive rehabilitation program.  Patient transferred to CIR on 03/12/2016 .   Patient currently requires max with mobility secondary to muscle weakness, impaired timing and sequencing,  unbalanced muscle activation and decreased coordination, L neglect & impaired vision in L eye, decreased attention to left and left side neglect, decreased awareness, decreased safety awareness and decreased memory and decreased sitting balance, decreased standing balance, hemiplegia and decreased balance strategies.  Prior to hospitalization, patient was independent  with mobility and lived with Other (Comment) (sister & intermittently stays with girlfriend) in a House home.  Home access is 5Stairs to enter.  Patient will benefit from skilled PT intervention to maximize safe functional mobility, minimize fall risk and decrease caregiver burden for planned discharge home with 24 hour supervision.  Anticipate patient will benefit from follow up [place] at discharge.  PT - End of Session Activity Tolerance: Tolerates 30+ min activity with multiple rests Endurance Deficit: Yes Endurance Deficit Description: 2/2 fatigue PT Assessment Rehab Potential (ACUTE/IP ONLY): Good PT Patient demonstrates impairments in the following area(s): Balance;Behavior;Motor;Endurance;Pain;Perception;Safety;Sensory PT Transfers Functional Problem(s): Bed Mobility;Bed to Chair;Furniture;Car PT Locomotion Functional Problem(s): Ambulation;Wheelchair Mobility;Stairs PT Plan PT Intensity: Minimum of 1-2 x/day ,45 to 90 minutes PT Frequency: 5 out of 7 days PT Duration Estimated Length of Stay: 14-20 days PT Treatment/Interventions: Ambulation/gait training;Discharge planning;Functional mobility training;Psychosocial support;Therapeutic Activities;Visual/perceptual remediation/compensation;Wheelchair propulsion/positioning;Therapeutic Exercise;Neuromuscular re-education;Disease management/prevention;Balance/vestibular training;Cognitive remediation/compensation;DME/adaptive equipment instruction;Pain management;Splinting/orthotics;UE/LE Strength taining/ROM;UE/LE Coordination activities;Stair training;Patient/family  education;Community reintegration PT Transfers Anticipated Outcome(s): supervision PT Locomotion Anticipated Outcome(s): supervision with LRAD PT Recommendation Recommendations for Other Services: Speech consult Follow Up Recommendations: Home  health PT;24 hour supervision/assistance Equipment Recommended: Rolling walker with 5" wheels  Skilled Therapeutic Intervention Pt received in w/c & agreeable to PT. Pt alert & oriented x 4 but presented with cognitive impairments as pt was trying to chew excess portion of bracelet off even with cuing not to do so. PT evaluation initiated, please see flow sheet below for manual testing details. Pt currently at Max A level for transfers & gait x 30 ft with rail in hallway. During gt pt is able to minimally activate LLE quads to assist with advancing LE. Pt requires min/mod A for bed mobility. PT educated on CIR schedule & educated on PT treatment. At end of session pt left in w/c with QRB in place & all needs within reach.   PT Evaluation Precautions/Restrictions Precautions Precautions: Fall Precaution Comments:  (L hemiplegia) Restrictions Weight Bearing Restrictions: No  General Chart Reviewed: Yes Additional Pertinent History: hx of CVA affecting L side in 2013 Response to Previous Treatment: Patient reporting fatigue but able to participate. Family/Caregiver Present: No  Vital Signs Therapy Vitals Pulse Rate: 74 (at rest) BP: (!) 89/55 mmHg (at end of session) Patient Position (if appropriate): Sitting Oxygen Therapy SpO2: 98 % O2 Device: Not Delivered  Pain Pain Assessment Pain Assessment: No/denies pain (denied pain at rest but c/o L foot corn pain & pain when touching L knee)  Home Living/Prior Functioning Home Living Available Help at Discharge: Family;Available 24 hours/day (sister & per chart 24 hours a day) Type of Home: House Home Access: Stairs to enter [employer] of Steps: 5 Entrance Stairs-Rails:  Right;Left;Can reach both Home Layout: Two level;Able to live on main level with bedroom/bathroom Alternate Level Stairs-Number of Steps:  (full flight to second level) Bathroom Shower/Tub: Tub/shower unit;Curtain Insurance claims handler Accessibility: Yes Additional Comments: states house is handicapped accessible  Lives With: Other (Comment) (sister & intermittently stays with girlfriend) Prior Function Level of Independence: Independent with basic ADLs;Independent with gait;Independent with transfers;Independent with homemaking with ambulation  Able to Take Stairs?: Yes Driving: No Vocation: Unemployed Leisure: Hobbies-yes (Comment) Comments: bowling  Vision/Perception  Vision - Assessment Additional Comments: Pt able to find items on left visual fieldm, no peripheral vision loss noted during testing. May want to continue to assess in functional context.  Praxis Praxis-Other Comments: ? limb apraxia   Cognition Overall Cognitive Status: No family/caregiver present to determine baseline cognitive functioning Orientation Level: Oriented X4;Oriented to person;Oriented to place;Oriented to time;Oriented to situation Memory: Impaired Awareness: Impaired Problem Solving: Impaired Safety/Judgment: Impaired  Sensation Sensation Light Touch: Impaired by gross assessment (LLE) Proprioception: Impaired by gross assessment (LLE) Coordination Heel Shin Test: unable 2/2 muscle weakness in LLE   Motor  Motor Motor - Skilled Clinical Observations:  (L side weakness (UE & LE), impaired coordination LLE)    Mobility Bed Mobility Bed Mobility: Rolling Right;Rolling Left;Supine to Sit;Sit to Supine Rolling Right: 3: Mod assist Rolling Left: 4: Min guard Supine to Sit: 4: Min assist (LLE) Sit to Supine: 4: Min guard (LLE) Transfers Transfers: Yes Sit to Stand: 2: Max assist Stand to Sit: 2: Max assist Stand Pivot Transfers: 2: Max assist   Locomotion   Ambulation Ambulation: Yes Ambulation Distance (Feet): 30 Feet Assistive device: Other (Comment) (rail in hallway) Ambulation/Gait Assistance Details: Tactile cues for sequencing;Tactile cues for initiation;Manual facilitation for placement;Manual facilitation for weight shifting;Verbal cues for technique;Verbal cues for sequencing Ambulation/Gait Assistance Details:  (L quad activation with pt able to assist with advancing LLE) Stairs / Additional Locomotion  Stairs: No Architect: Yes Wheelchair Assistance: 3: Mod Lexicographer: Right lower extremity;Right upper extremity Wheelchair Parts Management: Needs assistance Distance: 100 ft    Balance Balance Balance Assessed: Yes Static Sitting Balance Static Sitting - Balance Support: Bilateral upper extremity supported Static Sitting - Level of Assistance: 5: Stand by assistance   Extremity Assessment  RUE Assessment RUE Assessment: Within Functional Limits LUE Assessment LUE Assessment: Exceptions to WFL LUE AROM (degrees) Overall AROM Left Upper Extremity: Deficits (stiffness, pt unable to fully flex LUE) LUE PROM (degrees) Overall PROM Left Upper Extremity: Deficits (stiffness, therapist unable to perform full ROM passively to LUE) LUE Strength LUE Overall Strength Comments: Decreased overall strength throughout LUEm, grossly 3+/5 RLE Assessment RLE Assessment: Exceptions to [facility] RLE AROM (degrees) Overall AROM Right Lower Extremity: Within functional limits for tasks assessed RLE Strength Right Hip Flexion: 3+/5 Right Knee Flexion: 3+/5 Right Knee Extension: 4+/5 Right Ankle Dorsiflexion: 4/5 LLE Assessment LLE Assessment: Exceptions to WFL LLE PROM (degrees) Overall PROM Left Lower Extremity: Within functional limits for tasks assessed LLE Strength Left Hip Flexion: 2/5 Left Knee Extension: 2/5 Left Ankle Dorsiflexion: 1/5   See Function Navigator for Current  Functional Status.   Refer to Care Plan for Long Term Goals  Recommendations for other services: Other: Speech Therapy  Discharge Criteria: Patient will be discharged from PT if patient refuses treatment 3 consecutive times without medical reason, if treatment goals not met, if there is a change in medical status, if patient makes no progress towards goals or if patient is discharged from hospital.  The above assessment, treatment plan, treatment alternatives and goals were discussed and mutually agreed upon: by patient  Waunita Schooner 03/13/2016, 11:51 AM

## 2016-03-13 NOTE — Progress Notes (Addendum)
[facility] PHYSICAL MEDICINE & REHABILITATION     PROGRESS NOTE  Subjective/Complaints:  Patient lying in bed this morning. Incontinent bowel episode early this AM.  ROS: Denies CP, SOB, nausea, vomiting, diarrhea.  Objective: Vital Signs: Blood pressure 121/76, pulse 74, temperature 98.2 F (36.8 C), temperature source Oral, resp. rate 18, height 6\' 1"  (1.854 m), weight 85.5 kg (188 lb 7.9 oz), SpO2 100 %. No results found.  Recent Labs  03/12/16 0510 03/13/16 0430  WBC 10.1 10.0  HGB 8.2* 8.2*  HCT 25.9* 25.7*  PLT 403* 482*    Recent Labs  03/12/16 0510 03/13/16 0430  NA 130* 133*  K 3.8 4.2  CL 99* 101  GLUCOSE 201* 179*  BUN 7 7  CREATININE 0.63 0.59*  CALCIUM 8.9 9.1   CBG (last 3)   Recent Labs  03/12/16 1702 03/12/16 2115 03/13/16 0652  GLUCAP 262* 180* 213*    Wt Readings from Last 3 Encounters:  03/13/16 85.5 kg (188 lb 7.9 oz)  03/11/16 85.1 kg (187 lb 9.8 oz)  04/12/15 92.987 kg (205 lb)    Physical Exam:  BP 121/76 mmHg  Pulse 74  Temp(Src) 98.2 F (36.8 C) (Oral)  Resp 18  Ht 6\' 1"  (1.854 m)  Wt 85.5 kg (188 lb 7.9 oz)  BMI 24.87 kg/m2  SpO2 100% Constitutional: He appears well-developed and well-nourished.  HENT: Normocephalic and atraumatic.  Eyes: EOM are normal. Right eye exhibits no discharge. Left eye exhibits no discharge.  Cardiovascular: Normal rate and regular rhythm.  Respiratory: Effort normal and breath sounds normal. No respiratory distress.  GI: Soft. Bowel sounds are normal. He exhibits no distension.  Musculoskeletal: He exhibits no edema or tenderness.  Neurological: He is alert and oriented 3.  Mood is flat but appropriate.  Motor: Right upper extremity/right lower extremity: 5/5 proximal to distal Left upper extremity: 4/5 delt, bicep, wrist, tricep, HI with motor apraxia,  Left lower extremity: 3/5 HF, KE, ADF/PF with apraxia Skin: Skin is warm and dry.  Psychiatric: He has a normal mood and affect.  His behavior is normal  Assessment/Plan: 1. Functional deficits secondary to right MCA territory infarct as well as history of CVA with residual weakness which require 3+ hours per day of interdisciplinary therapy in a comprehensive inpatient rehab setting. Physiatrist is providing close team supervision and 24 hour management of active medical problems listed below. Physiatrist and rehab team continue to assess barriers to discharge/monitor patient progress toward functional and medical goals.  Function:  Bathing Bathing position      Bathing parts      Bathing assist        Upper Body Dressing/Undressing Upper body dressing                    Upper body assist        Lower Body Dressing/Undressing Lower body dressing                                  Lower body assist        Toileting Toileting          Toileting assist     Transfers Chair/bed transfer             Locomotion Ambulation           Wheelchair          Cognition Comprehension Comprehension assist level: Follows basic  conversation/direction with extra time/assistive device  Expression Expression assist level: Expresses basic needs/ideas: With extra time/assistive device, Expresses basic 90% of the time/requires cueing < 10% of the time., Expresses complex ideas: With extra time/assistive device  Social Interaction Social Interaction assist level: Interacts appropriately with others with medication or extra time (anti-anxiety, antidepressant).  Problem Solving Problem solving assist level: Solves basic 25 - 49% of the time - needs direction more than half the time to initiate, plan or complete simple activities  Memory Memory assist level: More than reasonable amount of time    Medical Problem List and Plan: 1. Altered mental status with left sided weakness secondary to right MCA territory infarct as well as history of CVA September 2013 with residual left-sided  weakness -Begin CIR 2. DVT Prophylaxis/Anticoagulation: SCDs. Monitor for any signs of DVT 3. Pain Management: Tylenol as needed 4. Mood: BuSpar 5 mg twice a day 5. Neuropsych: This patient is capable of making decisions on his own behalf. 6. Skin/Wound Care: Routine skin checks 7. Fluids/Electrolytes/Nutrition: Routine I&O  8. Upper GI bleed. Follow-up per gastroenterology. Continue Protonix  Hemoglobin 8.2 on 6/21 (stable). 9. Ischemic cardiac heart disease. No chest pain or shortness of breath. Aspirin 81 mg daily. No Plavix at this time due to GI bleed 10. Hypertension. Corgard 40 mg daily. Follow with increased mobility 11. Hyponatremia. 133 on 6/21 12. Diabetes mellitus. Hemoglobin A1c 5.6. Lantus insulin 5 units daily. Check blood sugars before meals and at bedtime.   Will continue to monitor and consider further adjustments with increased physical activity. 13. Pancreatitis. Continue Creon 24,000 units 3 times a day 14. Alcohol tobacco abuse. Nicoderm patch. Provide counseling 15. Klebsiella UTI. Ceftin 500 mg twice daily 03/12/2016 5 more days and stop  LOS (Days) 1 A FACE TO FACE EVALUATION WAS PERFORMED  Mikhayla Phillis Karis Juba 03/13/2016 8:28 AM

## 2016-03-13 NOTE — Plan of Care (Signed)
Problem: RH Balance Goal: LTG Patient will maintain dynamic standing balance (PT) LTG: Patient will maintain dynamic standing balance with assistance during mobility activities (PT) With LRAD  Problem: RH Bed Mobility Goal: LTG Patient will perform bed mobility with assist (PT) LTG: Patient will perform bed mobility with assistance, with/without cues (PT). Without hospital bed features  Problem: RH Bed to Chair Transfers Goal: LTG Patient will perform bed/chair transfers w/assist (PT) LTG: Patient will perform bed/chair transfers with assistance, with/without cues (PT). With LRAD  Problem: RH Car Transfers Goal: LTG Patient will perform car transfers with assist (PT) LTG: Patient will perform car transfers with assistance (PT). With LRAD  Problem: RH Furniture Transfers Goal: LTG Patient will perform furniture transfers w/assist (OT/PT LTG: Patient will perform furniture transfers with assistance (OT/PT). With LRAD  Problem: RH Ambulation Goal: LTG Patient will ambulate in controlled environment (PT) LTG: Patient will ambulate in a controlled environment, # of feet with assistance (PT). 150 ft with LRAD Goal: LTG Patient will ambulate in home environment (PT) LTG: Patient will ambulate in home environment, # of feet with assistance (PT). 50 ft with LRAD Goal: LTG Patient will ambulate in community environment (PT) LTG: Patient will ambulate in community environment, # of feet with assistance (PT). 150 ft with LRAD  Problem: RH Stairs Goal: LTG Patient will ambulate up and down stairs w/assist (PT) LTG: Patient will ambulate up and down # of stairs with assistance (PT) 5 steps with B rails

## 2016-03-13 NOTE — Progress Notes (Signed)
Patient information reviewed and entered into eRehab system by Kytzia Gienger, RN, CRRN, PPS Coordinator.  Information including medical coding and functional independence measure will be reviewed and updated through discharge.    

## 2016-03-14 ENCOUNTER — Inpatient Hospital Stay (HOSPITAL_COMMUNITY): Payer: Medicaid Other | Admitting: Physical Therapy

## 2016-03-14 ENCOUNTER — Inpatient Hospital Stay (HOSPITAL_COMMUNITY): Payer: Medicaid Other | Admitting: Occupational Therapy

## 2016-03-14 ENCOUNTER — Inpatient Hospital Stay (HOSPITAL_COMMUNITY): Payer: Medicaid Other | Admitting: Speech Pathology

## 2016-03-14 DIAGNOSIS — E871 Hypo-osmolality and hyponatremia: Secondary | ICD-10-CM

## 2016-03-14 DIAGNOSIS — I10 Essential (primary) hypertension: Secondary | ICD-10-CM

## 2016-03-14 LAB — GLUCOSE, CAPILLARY
GLUCOSE-CAPILLARY: 143 mg/dL — AB (ref 65–99)
GLUCOSE-CAPILLARY: 100 mg/dL — AB (ref 65–99)
Glucose-Capillary: 229 mg/dL — ABNORMAL HIGH (ref 65–99)
Glucose-Capillary: 146 mg/dL — ABNORMAL HIGH (ref 65–99)
Glucose-Capillary: 168 mg/dL — ABNORMAL HIGH (ref 65–99)

## 2016-03-14 NOTE — Plan of Care (Signed)
Problem: RH SAFETY Goal: RH STG ADHERE TO SAFETY PRECAUTIONS W/ASSISTANCE/DEVICE STG Adhere to Safety Precautions With min Assistance/Device.  Outcome: Not Progressing Impulsive, max cues needed

## 2016-03-14 NOTE — Patient Care Conference (Signed)
Inpatient RehabilitationTeam Conference and Plan of Care Update Date: 03/13/2016   Time: 2:15 PM    Patient Name: Brandon [name]      Medical Record Number: [id number]  Date of Birth: 1965-09-26 Sex: Male         Room/Bed: 4M09C/4M09C-01 Payor Info: Payor: MEDICAID [place] / Plan: MEDICAID [place] ACCESS / Product Type: *No Product type* /    Admitting Diagnosis: [facility] CVA  Admit Date/Time:  03/12/2016  6:15 PM Admission Comments: No comment available   Primary Diagnosis:  Right middle cerebral artery stroke [facility](HCC) Principal Problem: Right middle cerebral artery stroke [facility](HCC)  Patient Active Problem List   Diagnosis Date Noted  . Essential hypertension, benign   . Upper GI bleed   . 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  . Fatty liver   . Tobacco abuse   . Diabetes mellitus type 2 in nonobese (HCC)   . History of CVA with residual deficit   . Acute lower UTI   . Tachycardia   . Chronic alcoholic pancreatitis (HCC)   . Hyponatremia 03/09/2016  . 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  . Acute blood loss anemia 03/09/2016  . Gastric varices   . Alcohol abuse   . Left-sided neglect   . Severe anemia   . UGIB (upper gastrointestinal bleed)   . Pressure ulcer 03/06/2016  . Acute encephalopathy   . CVA (cerebral infarction) 03/05/2016  . Carotid stenosis 04/06/2015  . CAD in native artery 03/16/2015  . Unstable angina pectoris (HCC) 03/15/2015  . Neck pain 10/20/2013  . Insomnia 12/29/2012  . 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 Occlusion and stenosis of carotid artery with cerebral infarction 12/11/2012  . Unspecified cerebral artery occlusion with cerebral infarction 12/11/2012  . Fatigue 11/26/2012  . Hallux valgus 06/25/2012  . Preventative health care 06/25/2012  . Alcohol Dependence 06/18/2012  . Smoking 06/16/2012  . Bilateral extracranial carotid artery stenosis   . Coronary Artery Disease 06/13/2012  .  Ischemic Stroke 06/13/2012  . Hyperlipidemia   . Hypertension 02/25/2011  . GERD (gastroesophageal reflux disease) 02/25/2011  . Chronic Pancreatitis. 02/25/2011  . Hepatic steatosis 02/25/2011    Expected Discharge Date: Expected Discharge Date: 03/29/16  Team Members Present: Physician leading conference: Dr. Maryla MorrowAnkit Patel Social Worker Present: Dossie DerBecky Kahlani Graber, LCSW Nurse Present: Carmie EndAngie Joyce, RN PT Present: Grier RocherAustin Tucker, PT;Victoria Hyacinth MeekerMiller, PT OT Present: Roney MansJennifer Smith, OT SLP Present: Fae PippinMelissa Bowie, SLP PPS Coordinator present : Tora DuckMarie Noel, RN, CRRN     Current Status/Progress Goal Weekly Team Focus  Medical   Altered mental status with left sided weakness secondary to right MCA territory infarct as well as history of CVA September 2013 with residual left-sided weakness  Improve safety, cognition, mobility  see above   Bowel/Bladder   Patient incontinent of urine at times/ some urinary retention with difficulty voiding. LBM 03/12/16  min assist  cont. to monitor q shift   Swallow/Nutrition/ Hydration   soft per GI MD but able to tolerate regular when ready with thin liquids   N/A swallow WFL      ADL's             Mobility   Max A for transfers (sit<>stand, stand pivot, car), 30 ft with rail in hallway with Max A, L neglect, significantly weak LUE & LLE  supervision for ambulation with LRAD, supervision for transfers  neuro re-ed, balance (sitting & standing), gait training, strengthening   Communication   [facility]  Safety/Cognition/ Behavioral Observations  Mod assist with therapy Total assist nursing   Supervision   increase awareness, left attention, problem solving and safety    Pain   no complaints of pain  min assist  cont. to monitor q sift   Skin   CDI  no skin breakdown this admission  cont. to monitor q shift    Rehab Goals Patient on target to meet rehab goals: No *See Care Plan and progress notes for long and short-term goals.  Barriers to  Discharge: UTI, DM, ABLA, hyponatremia    Possible Resolutions to Barriers:  Follow labs, cont abx,    Discharge Planning/Teaching Needs:    Home with girlfriend-[name] who will arrange 24 hr care, she does work. Pt wants disability pushed through while here.     Team Discussion:  New eval-goals supervision level, impulsive and has safety issues. Treating UTI. No awareness of his deficits and awareness issues. Visual issues-OT to pursue further.   Revisions to Treatment Plan:  New eval   Continued Need for Acute Rehabilitation Level of Care: The patient requires daily medical management by a physician with specialized training in physical medicine and rehabilitation for the following conditions: Daily direction of a multidisciplinary physical rehabilitation program to ensure safe treatment while eliciting the highest outcome that is of practical value to the patient.: Yes Daily medical management of patient stability for increased activity during participation in an intensive rehabilitation regime.: Yes Daily analysis of laboratory values and/or radiology reports with any subsequent need for medication adjustment of medical intervention for : Neurological problems;Diabetes problems;Mood/behavior problems;Other  Lucy ChrisDupree, Micharl Helmes G 03/15/2016, 8:37 AM

## 2016-03-14 NOTE — Progress Notes (Signed)
Social Work  Social Work Assessment and Plan  Patient Details  Name: Marcelle SmilingKeith W Schulke MRN: [id number] Date of Birth: [birth year]  Today's Date: 03/14/2016  Problem List:  Patient Active Problem List   Diagnosis Date Noted  . Essential hypertension, benign   . Upper GI bleed   . 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  . Fatty liver   . Tobacco abuse   . Diabetes mellitus type 2 in nonobese (HCC)   . History of CVA with residual deficit   . Acute lower UTI   . Tachycardia   . Chronic alcoholic pancreatitis (HCC)   . Hyponatremia 03/09/2016  . 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  . Acute blood loss anemia 03/09/2016  . Gastric varices   . Alcohol abuse   . Left-sided neglect   . Severe anemia   . UGIB (upper gastrointestinal bleed)   . Pressure ulcer 03/06/2016  . Acute encephalopathy   . CVA (cerebral infarction) 03/05/2016  . Carotid stenosis 04/06/2015  . CAD in native artery 03/16/2015  . Unstable angina pectoris (HCC) 03/15/2015  . Neck pain 10/20/2013  . Insomnia 12/29/2012  . 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 Occlusion and stenosis of carotid artery with cerebral infarction 12/11/2012  . Unspecified cerebral artery occlusion with cerebral infarction 12/11/2012  . Fatigue 11/26/2012  . Hallux valgus 06/25/2012  . Preventative health care 06/25/2012  . Alcohol Dependence 06/18/2012  . Smoking 06/16/2012  . Bilateral extracranial carotid artery stenosis   . Coronary Artery Disease 06/13/2012  . Ischemic Stroke 06/13/2012  . Hyperlipidemia   . Hypertension 02/25/2011  . GERD (gastroesophageal reflux disease) 02/25/2011  . Chronic Pancreatitis. 02/25/2011  . Hepatic steatosis 02/25/2011   Past Medical History:  Past Medical History  Diagnosis Date  . Alcohol dependency (HCC)     Hx ETOH withdrawal seizures before 2011  . Pancreatitis 2011....     CT findings in May 2011 with inflammation and  pseudocyst.  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/2016  . GERD (gastroesophageal reflux disease)   . CAD (coronary artery disease) 06/13/2012    Calcification noted on CTA of chest in 2012 Wall motion abnormality on ECHO    . 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   Marland Kitchen. Hyperlipidemia   . Headache(784.0)     migraine  . Heart disease 02/2015    PCI/DES placed to RCA: on chronic Plavix/ASA  . Depression with anxiety   . Hypertension   . Carotid artery disease (HCC) 2016    bilateral.  s/p left carotid stent 03/2015  . Renal artery stenosis, native, bilateral (HCC) 02/2015   Past Surgical History:  Past Surgical History  Procedure Laterality Date  . None    . Carotid angiogram N/A 06/15/2012    Procedure: CAROTID ANGIOGRAM;  Surgeon: Chuck Hinthristopher S Dickson, MD;  [facility];  Service: Cardiovascular;  Laterality: N/A;  . Cardiac catheterization N/A 03/15/2015    Procedure: Left Heart Cath;  Surgeon: Laurier NancyShaukat A Khan, MD;  Location: [facility] INVASIVE CV LAB;  Service: Cardiovascular;  Laterality: N/A;  . Cardiac catheterization N/A 03/16/2015    Procedure: Coronary Stent Intervention;  Surgeon: Alwyn Peawayne D Callwood, MD;  Location: [facility] INVASIVE CV LAB;  Service: Cardiovascular;  Laterality: N/A;  . Peripheral vascular catheterization Left 04/06/2015    Procedure: Carotid PTA/Stent Intervention;  Surgeon: Annice NeedyJason S Dew, MD;  Location: [facility] INVASIVE CV LAB;  Service: Cardiovascular;  Laterality: Left;  . Esophagogastroduodenoscopy N/A 03/08/2016    Procedure: ESOPHAGOGASTRODUODENOSCOPY (EGD);  Surgeon: Ruffin FrederickSteven Paul Armbruster, MD;  [facility];  Service: Gastroenterology;  Laterality: N/A;   Social History:  reports that he has been smoking Cigarettes.  He has a 30 pack-year smoking history. He has never used smokeless tobacco. He reports that he drinks about 3.6 oz of alcohol per week. He reports that he does not use illicit  drugs.  Family / Support Systems Marital Status: Single Patient Roles: Partner, Other (Comment) (Sister) Spouse/Significant Other: [name]-girlfriend [phone]-cell phone is actually [name]'s son's -Alecia LemmingVictor his voice is on the voice mail Children: None Other Supports: [name] Justice-sister [phone]-cell Anticipated Caregiver: Marchelle FolksAmanda Ability/Limitations of Caregiver: [name] works as a Animatorprivate duty nurse's aide, she plans to make arrangements for him Caregiver Availability: 24/7 Family Dynamics: Pt was going back and forth between his sister's home and [name]'s. They have been togehter for years and [name] feels bad for him since he has nobody. His sister plans on selling her home and moving. He has a few friends, but [name] reports they are not good for him.  Social History Preferred language: English Religion: Baptist Cultural Background: No issues Education: High School Read: Yes Write: Yes Employment Status: Unemployed Date Retired/Disabled/Unemployed: trying to get disability-appealing the decision Legal Hisotry/Current Legal Issues: No issues Guardian/Conservator: None-according to the MD he is capable of making his own decisions while here. He has severe memory issues, so will include sister who is next of kin and [name]. [name] sister is agreeable to this   Abuse/Neglect Physical Abuse: Denies Verbal Abuse: Denies Sexual Abuse: Denies Exploitation of patient/patient's resources: Denies Self-Neglect: Denies  Emotional Status Pt's affect, behavior adn adjustment status: Pt wants to be independent and tries to get up wen when he is not suppose too. He is impulsive and a safety risk. [name] reports he has been this way but was more mobile before this stroke. He has had memory issues since first stroke. She needs him to be mobile so at least he doesn;t require physical assist. Recent Psychosocial Issues: other health issues this is his third stroke. Pyschiatric History: History of  depression ans anxiety has taken medicines in the past and is tking some now, feels  they help. [name] reports it keeps him calm. Would benefit from seeing neuro-psych while here for coping and substance abuse issues. Will make referral. Substance Abuse History: ETOH & Tobacco aware he needs to quit both for his health issues. [name] will make sure he stops drinking at her home. Will continue to discuss while here.  Patient / Family Perceptions, Expectations & Goals Pt/Family understanding of illness & functional limitations: Pt is able to verbalize he had a stroke, but has little carry over and premorbid memory issues. [name] has spoken with the MD's and feels she has a basic understanding, she would like to talk with the MD again due to she has more questions for them. Premorbid pt/family roles/activities: Boyfriend, Brother, friend, etc Anticipated changes in roles/activities/participation: resume Pt/family expectations/goals: Pt states: " I need to get my disability so I can pay for care." [name] states: " I need for you to help with his disability, but I will do my best to take care of him."  [employer] Community Agencies: Other (Comment) (has Medicaid) Premorbid Home Care/DME Agencies: Other (Comment) (had in past OPPT) Transportation available at discharge: [name] drives, pt has not driven Resource referrals recommended: Neuropsychology, Support group (specify)  Discharge Planning Living Arrangements:  Other relatives, Spouse/significant other Support Systems: Spouse/significant other, Other relatives, Friends/neighbors Type of Residence: Private residence Insurance Resources: [employer] (specify county) Architect: Family Support, Other (Comment) (pending disability) Financial Screen Referred: Previously completed (Applied for disability and appealing it) Living Expenses: Other (Comment) (Sister and Girlfriend) Money Management: Significant Other Does the patient have  any problems obtaining your medications?: No Home Management: Sister or Girlfriend Patient/Family Preliminary Plans: Go to [name]'s home and she will figure out a way to provide 24 hr supervision she does have to work to pay her bills. Informed her of his target discharge date and care needs. She reports his meomory issues are from before from the last stroke.  Social Work Anticipated Follow Up Needs: HH/OP, Support Group  Clinical Impression Pt has severe memory issues and doesn't remember from one minute to the next. [name] his girlfriend reports he was like this before this stroke, he could move around better. She is aware he will need 24 hr supervision for safety. She will try to arrange along with a family member of hers. Will work on pt's disability appeal and gather information to assist with the approval process. Will work on discharge needs. Will benefit from neuro-psych seeing while here.  Lucy Chris 03/14/2016, 3:38 PM

## 2016-03-14 NOTE — Progress Notes (Signed)
Speech Language Pathology Daily Session Note  Patient Details  Name: Brandon [name] MRN: [id number] Date of Birth: 1966/06/28  Today's Date: 03/14/2016 SLP Individual Time: 0800-0830 SLP Individual Time Calculation (min): 30 min  Short Term Goals: Week 1: SLP Short Term Goal 1 (Week 1): Patient will verbally identify 2 physical and 1 cognitive deficits post CVA with Mod assist question cues SLP Short Term Goal 2 (Week 1): Patient will request help as needed during basic and familiar problem solving takss with Max assistmultimodal cues  SLP Short Term Goal 3 (Week 1): Patient will attend to left upper extremity and utilize during familiar tasks with Mod assist verbal cues SLP Short Term Goal 4 (Week 1): Patient will alternate attention for 5 minutes with Min assist verbal cues for redirection  SLP Short Term Goal 5 (Week 1): Patient will utilize extenral aids to assist with recall of new information with Min assist verbal cues   Skilled Therapeutic Interventions: Pt seen for skilled SLP services targeting medication management. Pt O x4, but has some confusion re: staff names/positions. Required mod cueing to recall pertinent details from the previous day. Pt able to recall names of meds taken at home with min A and explain his medication management routine. Pt did not understand insulin injections and was suprised to learn that he is now considered diabetic. Pt will require further education/training re: diabetes and medication management.    Function:  Eating Eating   Modified Consistency Diet: No Eating Assist Level: More than reasonable amount of time           Cognition Comprehension Comprehension assist level: Understands basic 75 - 89% of the time/ requires cueing 10 - 24% of the time  Expression   Expression assist level: Expresses basic 75 - 89% of the time/requires cueing 10 - 24% of the time. Needs helper to occlude trach/needs to repeat words.  Social Interaction     Problem Solving Problem solving assist level: Solves basic 25 - 49% of the time - needs direction more than half the time to initiate, plan or complete simple activities  Memory Memory assist level: Recognizes or recalls 50 - 74% of the time/requires cueing 25 - 49% of the time    Pain Pain Assessment Pain Assessment: 0-10 Pain Score: 4  Pain Location: Back Pain Orientation: Mid;Lower Pain Descriptors / Indicators: Aching Patients Stated Pain Goal: 2 Pain Intervention(s): RN made aware;Distraction  Therapy/Group: Individual Therapy  Brandon CraftsKara E Soua Lenk MA, CCC-SLP 03/14/2016, 8:53 AM

## 2016-03-14 NOTE — Progress Notes (Signed)
Physical Therapy Session Note  Patient Details  Name: Brandon [name] MRN: [id number] Date of Birth: 1965-12-13  Today's Date: 03/14/2016 PT Individual Time: 0902-1002 AND 1615-1700 PT Individual Time Calculation (min): 60 min AND 45 min   Short Term Goals: Week 1:  PT Short Term Goal 1 (Week 1): Pt will complete stand/squat pivot transfers & sit<>stand transfers with Min A.  PT Short Term Goal 2 (Week 1): Pt will ambulate with LRAD x 50 ft with Min A. PT Short Term Goal 3 (Week 1): Pt will complete bed mobility with Steady A.  Skilled Therapeutic Interventions/Progress Updates:     Session 1 Patient received semirecumbent in bed and agreeable to PT. Patient desired to get dressed, but only shorts were wet from earlier incontinent bladder episode. PT assisted patient to don underpants over brief per patient request with mod A and min cues for proper sequencing to dress affected LE first.   WC mobility for 10 ft with BUE use and mod A-maxA from PT. [facility] Institute IncWC training also performed with R hemi technique with min-mod A to maintain straight trajectory. Patient able to maintain straight path for approx 10 ft with Hemi technique.   Sit stand training with mod A progressing to min A x 6 PT provided mod cues to maintain attention on task, improved UE placement, increase forward weight shift and improved LLE kne/hip extension.   Supine therex.  SAQ x 8  Bridges x 10   Clam shells against manual resistance. X 10  Throughout Supine therex PT provided mod cues to maintain BLE in neutral rotation with emphasis on attention to LLE as well as continued focus on task and improved ROM and control with BLE. Patient responded moderately to cues from PT with improved performance 25-50% of the time.   Stand pivot transfer training with max A from PT and max cues for Sequencing for BLE and improved hip/knee extension to come to standing position x 6 throughout treatment.   Nustep training level 3 for 5 minutes,.  LE only to reinforce reciprocal movement pattern. PT provided min cues for continued focus on task improved speed of movement >25 step per minute.   Patient returned to room and [facility] with call bell within reach and lap belt in place.      Session 2.  Gait training in hall with rail support for 425ft x 2 with Mod A and +2 for WC follow. PT required to provide mod A for improve posture, proper sequencing of steps and R UE, decreased step length and LLE limb advancement for last 5-910ft of gait due to increased fatigue .     Sit<>stand x4 in Parallel bars with min A from PT and mod cues for push from arm rests.  Gait training in parallel bars for 2320ft with mod A from PT and max cues for posture and improve attention to LLE and LUE to advance limb.    Sit>stand with RW x 4 with min A and min-mod cues for proper UE placement for increased safety Standing marches with BUE support on RW with mod A from PT to prevent LOB and improve awareness of LLE through tactile input. Patient demonstrates decreased step height on R LE 2/2 poor stability while standing on LLE. Step height did not improve with PT to block Lknee to prevent knee buckling.   Patient returned to room and [facility] with lap belt in place and call bell within reach.   Therapy Documentation  Precautions:  Precautions Precautions: Fall Precaution Comments:  (L hemiplegia) Restrictions Weight Bearing Restrictions: No    Vital Signs: Oxygen Therapy SpO2: 97 % O2 Device: Not Delivered Pain: Pain Assessment Pain Assessment: 0-10 Pain Score: 4  Pain Location: Back Pain Orientation: Mid;Lower Pain Descriptors / Indicators: Aching Patients Stated Pain Goal: 2 Pain Intervention(s): RN made aware;Distraction   See Function Navigator for Current Functional Status.   Therapy/Group: Individual Therapy  Golden Popustin E Joangel Vanosdol 03/14/2016, 10:04 AM

## 2016-03-14 NOTE — Progress Notes (Signed)
Occupational Therapy Session Note  Patient Details  Name: Marcelle SmilingKeith W Weinel MRN: [id number] Date of Birth: 03/26/1966  Today's Date: 03/14/2016 OT Individual Time: 7829-56211015-1115 OT Individual Time Calculation (min): 60 min    Short Term Goals: Week 1:  OT Short Term Goal 1 (Week 1): Pt will be mod I with bed mobility as a precursor for ADLs  OT Short Term Goal 2 (Week 1): Pt will be educated on a functional strengthening HEP for LUE OT Short Term Goal 3 (Week 1): Pt will be min assist for toilet transfers OT Short Term Goal 4 (Week 1): Pt will be min assist for UB/LB dressing OT Short Term Goal 5 (Week 1): Pt will be educated on shower equipment for safety at home  Skilled Therapeutic Interventions/Progress Updates:    Pt seen for OT ADL bathing/dressing session. Pt sitting up in w/c upon arrival, RN present, and pt agreeable to tx session. He was oriented to person and situation.  He completed stand pivot transfer to shower chair using grab bars. Required max cuing for advancement of feet to pivot into shower. Upon standing to get into shower pt with incontinent urination in standing. He required total A for standing balance and total A to shift hips to sit safely on shower chair. Required same amount of assist when exiting shower. He cont max cuing throughout session to sequence steps of task and for maintaining attention to task.   He stood at sink with initial min A to stand with VCs for technique, however, once in standing required min- total A for upright positioning while pants pulled up. He was able to recall hemi-dressing techniques, however, required cues for initiation of functional use/ movement in L UE.  Grooming completed from w/c level with set-up. Pt left sitting in w/c at end of session, QRB donned and all needs in reach. Educated regarding use of call bell.   Therapy Documentation Precautions:  Precautions Precautions: Fall Precaution Comments:  (L  hemiplegia) Restrictions Weight Bearing Restrictions: No Pain: No/ denies pain See Function Navigator for Current Functional Status.   Therapy/Group: Individual Therapy  Lewis, Caulin Begley C 03/14/2016, 12:22 PM

## 2016-03-14 NOTE — Progress Notes (Signed)
Social Work Patient ID: Brandon [name], male   DOB: 06-Sep-1966, 50 y.o.   MRN: [id number] Attempting to get a hold of Brandon [name]-pt's girlfriend due to number on chart is wrong. Pt unsure of her number. Have contacted pt's sister-Brandon [name] to try to get number from her. She is to call me back with the right number. Pt can not complete assessment with this worker.

## 2016-03-14 NOTE — Progress Notes (Signed)
PHYSICAL MEDICINE & REHABILITATION     PROGRESS NOTE  Subjective/Complaints:  Patient sitting up in bed this morning taking his medications. He states he did not sleep well overnight due to the "lumpy bed". Per report, patient had a incontinent bowel episode on the floor and was rubbing his feet. When questioned about this, patient does not seem to recall this event. He does however appear to recall the president and was currently going on examination.  ROS: Denies CP, SOB, nausea, vomiting, diarrhea.  Objective: Vital Signs: Blood pressure 103/70, pulse 65, temperature 98.1 F (36.7 C), temperature source Oral, resp. rate 16, height 6\' 1"  (1.854 m), weight 84.4 kg (186 lb 1.1 oz), SpO2 96 %. No results found.  Recent Labs  03/12/16 0510 03/13/16 0430  WBC 10.1 10.0  HGB 8.2* 8.2*  HCT 25.9* 25.7*  PLT 403* 482*    Recent Labs  03/12/16 0510 03/13/16 0430  NA 130* 133*  K 3.8 4.2  CL 99* 101  GLUCOSE 201* 179*  BUN 7 7  CREATININE 0.63 0.59*  CALCIUM 8.9 9.1   CBG (last 3)   Recent Labs  03/13/16 1631 03/13/16 2042 03/14/16 0632  GLUCAP 274* 229* 146*    Wt Readings from Last 3 Encounters:  03/14/16 84.4 kg (186 lb 1.1 oz)  03/11/16 85.1 kg (187 lb 9.8 oz)  04/12/15 92.987 kg (205 lb)    Physical Exam:  BP 103/70 mmHg  Pulse 65  Temp(Src) 98.1 F (36.7 C) (Oral)  Resp 16  Ht 6\' 1"  (1.854 m)  Wt 84.4 kg (186 lb 1.1 oz)  BMI 24.55 kg/m2  SpO2 96% Constitutional: He appears well-developed and well-nourished.  HENT: Normocephalic and atraumatic.  Eyes: EOM are normal. Right eye exhibits no discharge. Left eye exhibits no discharge.  Cardiovascular: Normal rate and regular rhythm.  Respiratory: Effort normal and breath sounds normal. No respiratory distress.  GI: Soft. Bowel sounds are normal. He exhibits no distension.  Musculoskeletal: He exhibits no edema or tenderness.  Neurological: He is alert and oriented 3.  Mood is flat but  appropriate.  Motor: Right upper extremity/right lower extremity: 5/5 proximal to distal Left upper extremity: 4/5 delt, bicep, wrist, tricep, HI with motor apraxia,  Left lower extremity: 3-/5 HF, KE, 3+/5 ADF/PF with apraxia Skin: Skin is warm and dry.  Psychiatric: He has a normal mood and affect. His behavior is normal  Assessment/Plan: 1. Functional deficits secondary to right MCA territory infarct as well as history of CVA with residual weakness which require 3+ hours per day of interdisciplinary therapy in a comprehensive inpatient rehab setting. Physiatrist is providing close team supervision and 24 hour management of active medical problems listed below. Physiatrist and rehab team continue to assess barriers to discharge/monitor patient progress toward functional and medical goals.  Function:  Bathing Bathing position Bathing activity did not occur: Refused    Bathing parts      Bathing assist        Upper Body Dressing/Undressing Upper body dressing   What is the patient wearing?: Pull over shirt/dress     Pull over shirt/dress - Perfomed by patient: Put head through opening Pull over shirt/dress - Perfomed by helper: Thread/unthread left sleeve, Thread/unthread right sleeve, Pull shirt over trunk        Upper body assist        Lower Body Dressing/Undressing Lower body dressing   What is the patient wearing?: Non-skid slipper socks, Pants     Pants-  Performed by patient: Thread/unthread right pants leg Pants- Performed by helper: Pull pants up/down, Thread/unthread left pants leg Non-skid slipper socks- Performed by patient: Don/doff right sock Non-skid slipper socks- Performed by helper: Don/doff left sock                  Lower body assist Assist for lower body dressing: 2 Helpers (+2 needed for sit to/from stand)      Financial traderToileting Toileting Toileting activity did not occur: N/A        Toileting assist     Transfers Chair/bed transfer    Chair/bed transfer method: Stand pivot Chair/bed transfer assist level: Maximal assist (Pt 25 - 49%/lift and lower) Chair/bed transfer assistive device: Mechanical lift Mechanical lift: Stedy   Locomotion Ambulation     Max distance: 30 ft Assist level: Maximal assist (Pt 25 - 49%)   Wheelchair   Type: Manual Max wheelchair distance: 100 ft Assist Level: Moderate assistance (Pt 50 - 74%) (R hemi technique)  Cognition Comprehension Comprehension assist level: Understands basic 75 - 89% of the time/ requires cueing 10 - 24% of the time  Expression Expression assist level: Expresses basic 75 - 89% of the time/requires cueing 10 - 24% of the time. Needs helper to occlude trach/needs to repeat words.  Social Interaction Social Interaction assist level: Interacts appropriately 75 - 89% of the time - Needs redirection for appropriate language or to initiate interaction.  Problem Solving Problem solving assist level: Solves basic 25 - 49% of the time - needs direction more than half the time to initiate, plan or complete simple activities  Memory Memory assist level: Recognizes or recalls 50 - 74% of the time/requires cueing 25 - 49% of the time    Medical Problem List and Plan: 1. Altered mental status with left sided weakness secondary to right MCA territory infarct as well as history of CVA September 2013 with residual left-sided weakness -Cont CIR 2. DVT Prophylaxis/Anticoagulation: SCDs. Monitor for any signs of DVT 3. Pain Management: Tylenol as needed 4. Mood: BuSpar 5 mg twice a day 5. Neuropsych: This patient is capable of making decisions on his own behalf. 6. Skin/Wound Care: Routine skin checks 7. Fluids/Electrolytes/Nutrition: Routine I&O   On a mechanical soft diet due to pancreatitis. Will order labs for tomorrow and consider advancement of diet. 8. Upper GI bleed. Follow-up per gastroenterology. Continue Protonix  Hemoglobin 8.2 on 6/21 (stable). 9. Ischemic  cardiac heart disease. No chest pain or shortness of breath. Aspirin 81 mg daily. No Plavix at this time due to GI bleed 10. Hypertension. Corgard 40 mg daily. Follow with increased mobility 11. Hyponatremia. 133 on 6/21 12. Diabetes mellitus. Hemoglobin A1c 5.6. Lantus insulin 5 units daily. Check blood sugars before meals and at bedtime.   Will continue to monitor and consider further adjustments with increased physical activity.  Trending down with activity 13. Pancreatitis. Continue Creon 24,000 units 3 times a day 14. Alcohol tobacco abuse. Nicoderm patch. Provide counseling 15. Klebsiella UTI. Ceftin 500 mg twice daily 03/12/2016 5 days and stop  LOS (Days) 2 A FACE TO FACE EVALUATION WAS PERFORMED  Ankit Karis Jubanil Patel 03/14/2016 8:53 AM

## 2016-03-14 NOTE — Care Management Note (Signed)
[facility] Individual Statement of Services  Patient Name:  Brandon [name]  Date:  03/14/2016  Welcome to the [facility].  Our goal is to provide you with an individualized program based on your diagnosis and situation, designed to meet your specific needs.  With this comprehensive rehabilitation program, you will be expected to participate in at least 3 hours of rehabilitation therapies Monday-Friday, with modified therapy programming on the weekends.  Your rehabilitation program will include the following services:  Physical Therapy (PT), Occupational Therapy (OT), Speech Therapy (ST), 24 hour per day rehabilitation nursing, Therapeutic Recreaction (TR), Neuropsychology, Case Management (Social Worker), Rehabilitation Medicine, Nutrition Services and Pharmacy Services  Weekly team conferences will be held on Wednesday to discuss your progress.  Your Social Worker will talk with you frequently to get your input and to update you on team discussions.  Team conferences with you and your family in attendance may also be held.  Expected length of stay:10-14 days    Overall anticipated outcome: supervision level  Depending on your progress and recovery, your program may change. Your Social Worker will coordinate services and will keep you informed of any changes. Your Social Worker's name and contact numbers are listed  below.  The following services may also be recommended but are not provided by the [facility]:    Home Health Rehabiltiation Services  Outpatient Rehabilitation Services    Arrangements will be made to provide these services after discharge if needed.  Arrangements include referral to agencies that provide these services.  Your insurance has been verified to be:  medicaid Your primary doctor is:  [employer] [facility] Khan  Pertinent information will be shared with your doctor and your insurance company.  Social Worker:   Dossie DerBecky Odeal [place], [place] [phone] or [phone]  Information discussed with and copy given to patient by: Lucy Chrisupree, Hugh Garrow G, 03/14/2016, 8:53 AM

## 2016-03-15 ENCOUNTER — Inpatient Hospital Stay (HOSPITAL_COMMUNITY): Payer: Medicaid Other | Admitting: Physical Therapy

## 2016-03-15 ENCOUNTER — Inpatient Hospital Stay (HOSPITAL_COMMUNITY): Payer: Medicaid Other | Admitting: Occupational Therapy

## 2016-03-15 ENCOUNTER — Inpatient Hospital Stay (HOSPITAL_COMMUNITY): Payer: Medicaid Other | Admitting: Speech Pathology

## 2016-03-15 LAB — BASIC METABOLIC PANEL
Anion gap: 6 (ref 5–15)
BUN: 8 mg/dL (ref 6–20)
CO2: 26 mmol/L (ref 22–32)
CREATININE: 0.63 mg/dL (ref 0.61–1.24)
Calcium: 8.9 mg/dL (ref 8.9–10.3)
Chloride: 100 mmol/L — ABNORMAL LOW (ref 101–111)
GFR calc Af Amer: >60 mL/min (ref >60)
GLUCOSE: 145 mg/dL — AB (ref 65–99)
Potassium: 4.2 mmol/L (ref 3.5–5.1)
SODIUM: 132 mmol/L — AB (ref 135–145)

## 2016-03-15 LAB — CBC WITH DIFFERENTIAL/PLATELET
BASOS ABS: 0.1 10*3/uL (ref 0.0–0.1)
Basophils Relative: 1 %
EOS ABS: 0.1 10*3/uL (ref 0.0–0.7)
EOS PCT: 1 %
HEMATOCRIT: 26.4 % — AB (ref 39.0–52.0)
HEMOGLOBIN: 8.4 g/dL — AB (ref 13.0–17.0)
Lymphocytes Relative: 15 %
Lymphs Abs: 1.1 10*3/uL (ref 0.7–4.0)
MCH: 28.6 pg (ref 26.0–34.0)
MCHC: 31.8 g/dL (ref 30.0–36.0)
MCV: 89.8 fL (ref 78.0–100.0)
MONO ABS: 0.9 10*3/uL (ref 0.1–1.0)
Monocytes Relative: 11 %
Neutro Abs: 5.7 10*3/uL (ref 1.7–7.7)
Neutrophils Relative %: 72 %
Platelets: 608 10*3/uL — ABNORMAL HIGH (ref 150–400)
RBC: 2.94 MIL/uL — AB (ref 4.22–5.81)
RDW: 17 % — ABNORMAL HIGH (ref 11.5–15.5)
WBC: 7.8 10*3/uL (ref 4.0–10.5)

## 2016-03-15 LAB — GLUCOSE, CAPILLARY
GLUCOSE-CAPILLARY: 157 mg/dL — AB (ref 65–99)
GLUCOSE-CAPILLARY: 113 mg/dL — AB (ref 65–99)
Glucose-Capillary: 131 mg/dL — ABNORMAL HIGH (ref 65–99)

## 2016-03-15 LAB — AMYLASE: Amylase: 204 U/L — ABNORMAL HIGH (ref 28–100)

## 2016-03-15 LAB — LIPASE, BLOOD: LIPASE: 52 U/L — AB (ref 11–51)

## 2016-03-15 NOTE — Progress Notes (Signed)
Physical Therapy Session Note  Patient Details  Name: Brandon [name] MRN: [id number] Date of Birth: [birth year]  Today's Date: 03/15/2016 PT Individual Time: 0901-1000 AND 1615-1700 PT Individual Time Calculation (min): 59 min AND 45 min   Short Term Goals: Week 1:  PT Short Term Goal 1 (Week 1): Pt will complete stand/squat pivot transfers & sit<>stand transfers with Min A.  PT Short Term Goal 2 (Week 1): Pt will ambulate with LRAD x 50 ft with Min A. PT Short Term Goal 3 (Week 1): Pt will complete bed mobility with Steady A.  Skilled Therapeutic Interventions/Progress Updates:   Session 1   Patient received sitting in WC with trade off from OT.   WC mobility in hall for 125 ft with supervisionA and Min A x 1 using R hemi technique. Max cues for for improved use of R LE to maintain straight trajectory.   Stand pivot transfer training with RW and Min A from PT an max cues for sequencing and AD management.    Gait training with RW for 7910ft x2 and mod A from PT and max cues for sequencing for step to gait pattern and AD management as well as improved erect posture.   Stair training with BUE support on 8 steps, 4 inches each. Mod A from PT for LLE and LUE limb advancement and attention to LUE. Max cues also provided for improved step to gait pattern and increased erect posture.   Supine NMR:  BLE Bridges with emphasis on improved activation of LLE.  PNF D1 flexion extension x 10 BLE Marches with level 2 tband and min A from PT to prevent excessive knee flexion with each repetition. x8 Clamshells against manual resistance. x10 Mod-max cues for increased attention to L side which improved throughout each exercise.   Patient returned to room and [facility] with call bell within reach and lap belt in place and call bell within reach.    Session 2 :   Patient received sitting in WC and agreeable to PT. Patient's girlfriend present for treatment.   [facility]  mobility with R hemi technique for 11640ft with supervision A and max cues for improved use of R LE to maintain straight trajectory as well as improved coordination of movements to allow increased control. Cues also provided for increased use pull with R UE to turn to R as well as coordinating movement with R LE.   Patient performed stand pivot transfers with min A from PT and RW for WC<>mat transfer x 6 throughout treatment. Max verbal and visual cues for sequencing and increased awareness of LLE and AD management to prevent lateral/anterior LOB.   Standing balance training:  toe taps on 4 inch step with BUE support on RW  Toss 8 horseshoes to target with LUE support on RW Anterior/lateral reach to place and retrieve 6 horshes from basketball goal with LUE and UE on RW.  PT was required to provided min A with all balance training exercises as well as max cues for improved posture, improved positioning, and decreased compensation with stepping forward with RLE.   Gait training for 5710ft with RW  And Mod A to get in position for horseshoe toss as well as 1715ft with RW and mod A at end of treatment to [facility]. PT provided max cues for improved sequencing of movements with step to gait pattern as well as increased erect posture and to keep RW closer to COM with turns.  Patient was able to retain ~25% of instruction from PT.   Patient returned to room and [facility] and RN present.     Therapy Documentation Precautions:  Precautions Precautions: Fall Precaution Comments:  (L hemiplegia) Restrictions Weight Bearing Restrictions: No General:   Vital Signs: Oxygen Therapy SpO2: 93 % O2 Device: Not Delivered     See Function Navigator for Current Functional Status.   Therapy/Group: Individual Therapy  Golden Popustin E Kenzo Ozment 03/15/2016, 12:54 PM

## 2016-03-15 NOTE — Progress Notes (Signed)
Occupational Therapy Session Note  Patient Details  Name: Marcelle SmilingKeith W Yontz MRN: [id number] Date of Birth: March 17, 1966  Today's Date: 03/15/2016 OT Individual Time: 0800-0900 OT Individual Time Calculation (min): 60 min    Short Term Goals: Week 1:  OT Short Term Goal 1 (Week 1): Pt will be mod I with bed mobility as a precursor for ADLs  OT Short Term Goal 2 (Week 1): Pt will be educated on a functional strengthening HEP for LUE OT Short Term Goal 3 (Week 1): Pt will be min assist for toilet transfers OT Short Term Goal 4 (Week 1): Pt will be min assist for UB/LB dressing OT Short Term Goal 5 (Week 1): Pt will be educated on shower equipment for safety at home  Skilled Therapeutic Interventions/Progress Updates:    Pt completed bathing and dressing at the sink per request since he stated that he showered yesterday and it took "a long time".  Supervision with increased time for supine to sit EOB with min assist for stand pivot to the wheelchair.  Pt needed mod instructional cueing for bathing with decreased selective attention noted.  Pt at times would stop washing and then engage in conversation without returning back to task, unless cued.   Min assist for sit to stand during peri washing and pulling pants over his hips.  Mod demonstrational cueing for hemi technique to donn shorts.  He was able to recall needing to start with the LLE first, but could not figure out how to lift it off of the ground while reaching down.  Decreased emergent awareness noted with pt letting the water run over the wash pan when filling up instead of turning it off in time.   Pt left with PT after completion of oral hygiene and brushing his hair in sitting.      Therapy Documentation Precautions:  Precautions Precautions: Fall Precaution Comments:  (L hemiplegia) Restrictions Weight Bearing Restrictions: No  Pain: Pain Assessment Pain Assessment: No/denies pain ADL: See Function Navigator for Current  Functional Status.   Therapy/Group: Individual Therapy  Eeva Schlosser OTR/L 03/15/2016, 10:33 AM

## 2016-03-15 NOTE — Progress Notes (Signed)
Speech Language Pathology Daily Session Note  Patient Details  Name: Brandon [name] MRN: [id number] Date of Birth: 12/08/1965  Today's Date: 03/15/2016 SLP Individual Time: [id number] SLP Individual Time Calculation (min): 28 min  Short Term Goals: Week 1: SLP Short Term Goal 1 (Week 1): Patient will verbally identify 2 physical and 1 cognitive deficits post CVA with Mod assist question cues SLP Short Term Goal 2 (Week 1): Patient will request help as needed during basic and familiar problem solving takss with Max assistmultimodal cues  SLP Short Term Goal 3 (Week 1): Patient will attend to left upper extremity and utilize during familiar tasks with Mod assist verbal cues SLP Short Term Goal 4 (Week 1): Patient will alternate attention for 5 minutes with Min assist verbal cues for redirection  SLP Short Term Goal 5 (Week 1): Patient will utilize extenral aids to assist with recall of new information with Min assist verbal cues   Skilled Therapeutic Interventions:  Pt was seen for skilled ST targeting cognitive goals.  Therapist facilitated the session with a novel card game targeting visual scanning to the left of midline.  Pt required x1 min assist verbal cue to locate cards to the left of midline but was otherwise supervision during the abovementioned task.  Pt was able to identify 1 physical deficit and 1 cognitive deficit post CVA with supervision question cues.  Pt did not initially identify left inattention as an impairment he was experiencing currently; however he was able to identify deficits with min assist question cues.  Pt was left in wheelchair with call bell within reach and girlfriend at bedside.  Continue per current plan of care.    Function:  Eating Eating                 Cognition Comprehension Comprehension assist level: Understands basic 90% of the time/cues < 10% of the time  Expression   Expression assist level: Expresses basic 90% of the time/requires cueing <  10% of the time.  Social Interaction Social Interaction assist level: Interacts appropriately 90% of the time - Needs monitoring or encouragement for participation or interaction.  Problem Solving Problem solving assist level: Solves basic 75 - 89% of the time/requires cueing 10 - 24% of the time  Memory Memory assist level: Recognizes or recalls 75 - 89% of the time/requires cueing 10 - 24% of the time    Pain Pain Assessment Pain Assessment: No/denies pain   Therapy/Group: Individual Therapy  Nariyah Osias, Melanee SpryNicole L 03/15/2016, 10:24 PM

## 2016-03-15 NOTE — Progress Notes (Signed)
Social Work Patient ID: Brandon [name], male   DOB: [birth year], 50 y.o.   MRN: [id number] Met with [name]-girlfriend who was here to see pt. Gave worker IT consultant in [place] will contact him and see what information is needed to assist with the disability claim. Pt is doing better today, needs to be more mobile she is used to his memory/cognitive issues. She will be the one providing care to him at discharge. Have encouraged her to attend therapies with him.

## 2016-03-15 NOTE — Progress Notes (Signed)
[facility] PH[facility]ICAL MEDICINE & REHABILITATION     PROGRESS NOTE  Subjective/Complaints:  Patient seen lying in bed this morning about to take a shower. He states that taking a shower takes a lot out of him. Otherwise, he states he is doing well.  ROS: Denies CP, SOB, nausea, vomiting, diarrhea.  Objective: Vital Signs: Blood pressure 139/76, pulse 63, temperature 97.7 F (36.5 C), temperature source Oral, resp. rate 18, height 6\' 1"  (1.854 m), weight 84.1 kg (185 lb 6.5 oz), SpO2 100 %. No results found.  Recent Labs  03/13/16 0430 03/15/16 0547  WBC 10.0 7.8  HGB 8.2* 8.4*  HCT 25.7* 26.4*  PLT 482* 608*    Recent Labs  03/13/16 0430 03/15/16 0547  NA 133* 132*  K 4.2 4.2  CL 101 100*  GLUCOSE 179* 145*  BUN 7 8  CREATININE 0.59* 0.63  CALCIUM 9.1 8.9   CBG (last 3)   Recent Labs  03/14/16 1703 03/14/16 2052 03/15/16 0644  GLUCAP 100* 168* 131*    Wt Readings from Last 3 Encounters:  03/15/16 84.1 kg (185 lb 6.5 oz)  03/11/16 85.1 kg (187 lb 9.8 oz)  04/12/15 92.987 kg (205 lb)    Physical Exam:  BP 139/76 mmHg  Pulse 63  Temp(Src) 97.7 F (36.5 C) (Oral)  Resp 18  Ht 6\' 1"  (1.854 m)  Wt 84.1 kg (185 lb 6.5 oz)  BMI 24.47 kg/m2  SpO2 100% Constitutional: He appears well-developed and well-nourished.  HENT: Normocephalic and atraumatic.  Eyes: EOM are normal. Right eye exhibits no discharge. Left eye exhibits no discharge.  Cardiovascular: Normal rate and regular rhythm.  Respiratory: Effort normal and breath sounds normal. No respiratory distress.  GI: Soft. Bowel sounds are normal. He exhibits no distension.  Musculoskeletal: He exhibits no edema or tenderness.  Neurological: He is alert and oriented 3.  Mood is flat but appropriate.  Motor: Right upper extremity/right lower extremity: 5/5 proximal to distal Left upper extremity: 4/5 delt, bicep, wrist, tricep, HI with motor apraxia Left lower extremity: 4/5 HF, KE, 4+/5 ADF/PF with  apraxia Skin: Skin is warm and dry.  Psychiatric: He has a normal mood and affect. His behavior is normal  Assessment/Plan: 1. Functional deficits secondary to right MCA territory infarct as well as history of CVA with residual weakness which require 3+ hours per day of interdisciplinary therapy in a comprehensive inpatient rehab setting. Physiatrist is providing close team supervision and 24 hour management of active medical problems listed below. Physiatrist and rehab team continue to assess barriers to discharge/monitor patient progress toward functional and medical goals.  Function:  Bathing Bathing position Bathing activity did not occur: Refused Position: Systems developerhower  Bathing parts Body parts bathed by patient: Right arm, Left arm, Chest, Abdomen, Front perineal area Body parts bathed by helper: Buttocks, Back, Left lower leg, Right lower leg, Left upper leg, Right upper leg  Bathing assist Assist Level: Touching or steadying assistance(Pt > 75%)      Upper Body Dressing/Undressing Upper body dressing   What is the patient wearing?: Pull over shirt/dress     Pull over shirt/dress - Perfomed by patient: Thread/unthread right sleeve, Thread/unthread left sleeve, Put head through opening Pull over shirt/dress - Perfomed by helper: Pull shirt over trunk        Upper body assist        Lower Body Dressing/Undressing Lower body dressing   What is the patient wearing?: Non-skid slipper socks, Pants, Underwear, Socks, Shoes Underwear -  Performed by patient: Thread/unthread right underwear leg Underwear - Performed by helper: Thread/unthread left underwear leg, Pull underwear up/down Pants- Performed by patient: Thread/unthread right pants leg Pants- Performed by helper: Thread/unthread left pants leg, Pull pants up/down Non-skid slipper socks- Performed by patient: Don/doff right sock Non-skid slipper socks- Performed by helper: Don/doff left sock Socks - Performed by patient:  Don/doff right sock, Don/doff left sock   Shoes - Performed by patient: Don/doff right shoe, Don/doff left shoe Shoes - Performed by helper: Fasten right, Fasten left          Lower body assist Assist for lower body dressing: Touching or steadying assistance (Pt > 75%)      Toileting Toileting Toileting activity did not occur: N/A        Toileting assist     Transfers Chair/bed transfer   Chair/bed transfer method: Stand pivot Chair/bed transfer assist level: Maximal assist (Pt 25 - 49%/lift and lower) Chair/bed transfer assistive device: Armrests Mechanical lift: Stedy   Locomotion Ambulation     Max distance: 4320ft Assist level: Moderate assist (Pt 50 - 74%)   Wheelchair   Type: Manual Max wheelchair distance: 100 ft Assist Level: Moderate assistance (Pt 50 - 74%) (R hemi technique)  Cognition Comprehension Comprehension assist level: Understands basic 50 - 74% of the time/ requires cueing 25 - 49% of the time  Expression Expression assist level: Expresses basic 50 - 74% of the time/requires cueing 25 - 49% of the time. Needs to repeat parts of sentences.  Social Interaction Social Interaction assist level: Interacts appropriately 50 - 74% of the time - May be physically or verbally inappropriate.  Problem Solving Problem solving assist level: Solves basic 50 - 74% of the time/requires cueing 25 - 49% of the time  Memory Memory assist level: Recognizes or recalls 50 - 74% of the time/requires cueing 25 - 49% of the time    Medical Problem List and Plan: 1. Altered mental status with left sided weakness secondary to right MCA territory infarct as well as history of CVA September 2013 with residual left-sided weakness -Cont CIR 2. DVT Prophylaxis/Anticoagulation: SCDs. Monitor for any signs of DVT 3. Pain Management: Tylenol as needed 4. Mood: BuSpar 5 mg twice a day 5. Neuropsych: This patient is capable of making decisions on his own behalf. 6.  Skin/Wound Care: Routine skin checks 7. Fluids/Electrolytes/Nutrition: Routine I&O   On a mechanical soft diet due to pancreatitis. Will Advance diet. 8. Upper GI bleed. Follow-up per gastroenterology. Continue Protonix  Hemoglobin 8.4 on 6/23 (stable). 9. Ischemic cardiac heart disease. No chest pain or shortness of breath. Aspirin 81 mg daily. No Plavix at this time due to GI bleed 10. Hypertension. Corgard 40 mg daily. Follow with increased mobility 11. Hyponatremia. 132 on 6/23 (stable) 12. Diabetes mellitus. Hemoglobin A1c 5.6. Lantus insulin 5 units daily. Check blood sugars before meals and at bedtime.   Will continue to monitor and consider further adjustments with increased physical activity.  Trending down with activity 13. Pancreatitis. Continue Creon 24,000 units 3 times a day 14. Alcohol tobacco abuse. Nicoderm patch. Provide counseling 15. Klebsiella UTI. Ceftin 500 mg twice daily 03/12/2016 5 days and stop  LOS (Days) 3 A FACE TO FACE EVALUATION WAS PERFORMED  Jashae Wiggs Karis Jubanil Adonia Porada 03/15/2016 8:52 AM

## 2016-03-16 ENCOUNTER — Inpatient Hospital Stay (HOSPITAL_COMMUNITY): Payer: Medicaid Other | Admitting: Occupational Therapy

## 2016-03-16 ENCOUNTER — Inpatient Hospital Stay (HOSPITAL_COMMUNITY): Payer: Medicaid Other | Admitting: Speech Pathology

## 2016-03-16 ENCOUNTER — Inpatient Hospital Stay (HOSPITAL_COMMUNITY): Payer: Medicaid Other | Admitting: Physical Therapy

## 2016-03-16 LAB — GLUCOSE, CAPILLARY
GLUCOSE-CAPILLARY: 161 mg/dL — AB (ref 65–99)
Glucose-Capillary: 146 mg/dL — ABNORMAL HIGH (ref 65–99)
Glucose-Capillary: 119 mg/dL — ABNORMAL HIGH (ref 65–99)
Glucose-Capillary: 165 mg/dL — ABNORMAL HIGH (ref 65–99)
Glucose-Capillary: 140 mg/dL — ABNORMAL HIGH (ref 65–99)

## 2016-03-16 NOTE — Progress Notes (Signed)
Occupational Therapy Session Note  Patient Details  Name: Brandon [name] MRN: [id number] Date of Birth: 08-18-1966  Today's Date: 03/16/2016 OT Individual Time: 1347-1420 OT Individual Time Calculation (min): 33 min    Skilled Therapeutic Interventions/Progress Updates:    Pt worked on sit to stand, standing balance, and neuromuscular re-education during session.  Overall with decreased sustained attention to standing tasks with attention level less than one minute.  He needed only min assist to complete sit to stand from EOM but needed mod assist to facilitate upright standing as he exhibits increased left knee flexion as well as trunk flexion.  He was able to achieve full upright standing posture but could not maintain position and would attempt to sit down without telling therapist or requesting.  Used RW for short distance mobility with pt needing max demonstrational cueing and mod assist to stay inside of the walker as he would continually drifted to the right side of the walker, and when trying to advance the LLE he would bump into the back leg of the walker or be on the outside of it.  Noted pt with bladder incontinence in standing as well.  Therapist assisted with cleaning up urine and donning brief as pt was initially wearing underpants at start of this session.  Transferred back to wheelchair with therapist pushing him back to the room and applying safety belt and alarm belt at end of session.  Call button and phone in reach.   Therapy Documentation Precautions:  Precautions Precautions: Fall Precaution Comments:  (L hemiplegia) Restrictions Weight Bearing Restrictions: No  Pain: Pain Assessment Pain Assessment: No/denies pain ADL: See Function Navigator for Current Functional Status.   Therapy/Group: Individual Therapy  Brandon [name] OTR/L 03/16/2016, 3:57 PM

## 2016-03-16 NOTE — Progress Notes (Signed)
Speech Language Pathology Daily Session Note  Patient Details  Name: Brandon [name] MRN: [id number] Date of Birth: December 13, 1965  Today's Date: 03/16/2016 SLP Individual Time: [id number] SLP Individual Time Calculation (min): 42 min  Short Term Goals: Week 1: SLP Short Term Goal 1 (Week 1): Patient will verbally identify 2 physical and 1 cognitive deficits post CVA with Mod assist question cues SLP Short Term Goal 2 (Week 1): Patient will request help as needed during basic and familiar problem solving takss with Max assistmultimodal cues  SLP Short Term Goal 3 (Week 1): Patient will attend to left upper extremity and utilize during familiar tasks with Mod assist verbal cues SLP Short Term Goal 4 (Week 1): Patient will alternate attention for 5 minutes with Min assist verbal cues for redirection  SLP Short Term Goal 5 (Week 1): Patient will utilize extenral aids to assist with recall of new information with Min assist verbal cues   Skilled Therapeutic Interventions: Pt seen for skilled SLP services targeting medication management. Pt O x4 and is now demonstrating recall of staff names/positions. Required min cueing to recall pertinent details from the previous day's SLP session. Pt able to recall specifc goals of novel card game. Pt reports that he only takes his meds at home 1 x day in the morning to avoid confusion. He reports he doesn't think it matters if he takes the pm meds in the morning. Discussed this can be very critical to take particular meds as scheduled- reinforced this as related to his new diagnosis of diabetes. Pt will require reinforcement.   Function:  Eating Eating                 Cognition Comprehension Comprehension assist level: Understands basic 90% of the time/cues < 10% of the time  Expression   Expression assist level: Expresses basic 90% of the time/requires cueing < 10% of the time.  Social Interaction Social Interaction assist level: Interacts  appropriately 90% of the time - Needs monitoring or encouragement for participation or interaction.  Problem Solving Problem solving assist level: Solves basic 75 - 89% of the time/requires cueing 10 - 24% of the time  Memory Memory assist level: Recognizes or recalls 75 - 89% of the time/requires cueing 10 - 24% of the time    Pain Pain Assessment Pain Assessment: 0-10 Pain Score: 4  Pain Location: Back Patients Stated Pain Goal: 1 Pain Intervention(s): Repositioned  Therapy/Group: Individual Therapy  Rocky CraftsKara E Wilburt Messina MA, CCC-SLP 03/16/2016, 9:09 AM

## 2016-03-16 NOTE — Progress Notes (Signed)
[facility] PHYSICAL MEDICINE & REHABILITATION     PROGRESS NOTE  Subjective/Complaints:  Poor initiation, requests help for breakfast tray set up but able to use BUE  ROS: Denies CP, SOB, nausea, vomiting, diarrhea.  Objective: Vital Signs: Blood pressure 130/90, pulse 64, temperature 98 F (36.7 C), temperature source Oral, resp. rate 20, height 6\' 1"  (1.854 m), weight 82.9 kg (182 lb 12.2 oz), SpO2 99 %. No results found.  Recent Labs  03/15/16 0547  WBC 7.8  HGB 8.4*  HCT 26.4*  PLT 608*    Recent Labs  03/15/16 0547  NA 132*  K 4.2  CL 100*  GLUCOSE 145*  BUN 8  CREATININE 0.63  CALCIUM 8.9   CBG (last 3)   Recent Labs  03/15/16 1703 03/15/16 2043 03/16/16 0632  GLUCAP 113* 161* 146*    Wt Readings from Last 3 Encounters:  03/16/16 82.9 kg (182 lb 12.2 oz)  03/11/16 85.1 kg (187 lb 9.8 oz)  04/12/15 92.987 kg (205 lb)    Physical Exam:  BP 130/90 mmHg  Pulse 64  Temp(Src) 98 F (36.7 C) (Oral)  Resp 20  Ht 6\' 1"  (1.854 m)  Wt 82.9 kg (182 lb 12.2 oz)  BMI 24.12 kg/m2  SpO2 99% Constitutional: He appears well-developed and well-nourished.  HENT: Normocephalic and atraumatic.  Eyes: EOM are normal. Right eye exhibits no discharge. Left eye exhibits no discharge.  Cardiovascular: Normal rate and regular rhythm.  Respiratory: Effort normal and breath sounds normal. No respiratory distress.  GI: Soft. Bowel sounds are normal. He exhibits no distension.  Musculoskeletal: He exhibits no edema or tenderness.  Neurological: He is alert and oriented 3.  Mood is flat but appropriate.  Motor: Right upper extremity/right lower extremity: 5/5 proximal to distal Left upper extremity: 4/5 delt, bicep, wrist, tricep, HI with motor apraxia Left lower extremity: 4/5 HF, KE, 4+/5 ADF/PF with apraxia Skin: Skin is warm and dry.  Psychiatric: He has a normal mood and affect. His behavior is normal  Assessment/Plan: 1. Functional deficits secondary to  right MCA territory infarct as well as history of CVA with residual weakness which require 3+ hours per day of interdisciplinary therapy in a comprehensive inpatient rehab setting. Physiatrist is providing close team supervision and 24 hour management of active medical problems listed below. Physiatrist and rehab team continue to assess barriers to discharge/monitor patient progress toward functional and medical goals.  Function:  Bathing Bathing position Bathing activity did not occur: Refused Position: Wheelchair/chair at sink  Bathing parts Body parts bathed by patient: Right arm, Left arm, Chest, Abdomen, Front perineal area, Buttocks, Right upper leg, Left upper leg Body parts bathed by helper: Buttocks, Back, Left lower leg, Right lower leg, Left upper leg, Right upper leg  Bathing assist Assist Level: Touching or steadying assistance(Pt > 75%)      Upper Body Dressing/Undressing Upper body dressing   What is the patient wearing?: Pull over shirt/dress     Pull over shirt/dress - Perfomed by patient: Thread/unthread right sleeve, Thread/unthread left sleeve, Put head through opening, Pull shirt over trunk Pull over shirt/dress - Perfomed by helper: Pull shirt over trunk        Upper body assist Assist Level: Supervision or verbal cues      Lower Body Dressing/Undressing Lower body dressing   What is the patient wearing?: Non-skid slipper socks, Pants, Underwear, Socks, Shoes Underwear - Performed by patient: Thread/unthread right underwear leg, Pull underwear up/down Underwear - Performed by helper:  Thread/unthread left underwear leg Pants- Performed by patient: Thread/unthread right pants leg, Pull pants up/down Pants- Performed by helper: Thread/unthread left pants leg Non-skid slipper socks- Performed by patient: Don/doff right sock Non-skid slipper socks- Performed by helper: Don/doff left sock Socks - Performed by patient: Don/doff right sock, Don/doff left sock    Shoes - Performed by patient: Don/doff right shoe, Fasten right, Fasten left Shoes - Performed by helper: Don/doff left shoe          Lower body assist Assist for lower body dressing: Touching or steadying assistance (Pt > 75%)      Toileting Toileting Toileting activity did not occur: N/A   Toileting steps completed by helper: Adjust clothing prior to toileting, Performs perineal hygiene, Adjust clothing after toileting    Toileting assist Assist level: Two helpers (per Brunswick CorporationJobelle Mesias, NT)   Transfers Chair/bed transfer   Chair/bed transfer method: Stand pivot Chair/bed transfer assist level: Touching or steadying assistance (Pt > 75%) Chair/bed transfer assistive device: Armrests, Walker Mechanical lift: Landscape architecttedy   Locomotion Ambulation     Max distance: 5215ft Assist level: Moderate assist (Pt 50 - 74%)   Wheelchair   Type: Manual Max wheelchair distance: 140 Assist Level: Supervision or verbal cues  Cognition Comprehension Comprehension assist level: Understands basic 90% of the time/cues < 10% of the time  Expression Expression assist level: Expresses basic 90% of the time/requires cueing < 10% of the time.  Social Interaction Social Interaction assist level: Interacts appropriately 90% of the time - Needs monitoring or encouragement for participation or interaction.  Problem Solving Problem solving assist level: Solves basic 75 - 89% of the time/requires cueing 10 - 24% of the time  Memory Memory assist level: Recognizes or recalls 75 - 89% of the time/requires cueing 10 - 24% of the time    Medical Problem List and Plan: 1. Altered mental status with left sided weakness secondary to right MCA territory infarct as well as history of CVA September 2013 with residual left-sided weakness -Cont CIR PT, OT, SLP 2. DVT Prophylaxis/Anticoagulation: SCDs. Monitor for any signs of DVT 3. Pain Management: Tylenol as needed 4. Mood: BuSpar 5 mg twice a day 5.  Neuropsych: This patient is capable of making decisions on his own behalf. 6. Skin/Wound Care: Routine skin checks 7. Fluids/Electrolytes/Nutrition: Routine I&O   On a mechanical soft diet due to pancreatitis. Will Advance diet. 8. Upper GI bleed. Follow-up per gastroenterology. Continue Protonix  Hemoglobin 8.4 on 6/23 (stable). 9. Ischemic cardiac heart disease. No chest pain or shortness of breath. Aspirin 81 mg daily. No Plavix at this time due to GI bleed 10. Hypertension. Corgard 40 mg daily. Follow with increased mobility 11. Hyponatremia. 132 on 6/23 (stable) 12. Diabetes mellitus. Hemoglobin A1c 5.6. Lantus insulin 5 units daily. Check blood sugars before meals and at bedtime.   Will continue to monitor and consider further adjustments with increased physical activity.   CBG (last 3)   Recent Labs  03/15/16 1703 03/15/16 2043 03/16/16 0632  GLUCAP 113* 161* 146*    13. Pancreatitis. Continue Creon 24,000 units 3 times a day 14. Alcohol tobacco abuse. Nicoderm patch. Provide counseling 15. Klebsiella UTI. Ceftin 500 mg twice daily 03/12/2016 5 days and stop  LOS (Days) 4 A FACE TO FACE EVALUATION WAS PERFORMED  Erick ColaceKIRSTEINS,Menaal Russum E 03/16/2016 7:41 AM

## 2016-03-16 NOTE — Progress Notes (Signed)
Occupational Therapy Session Note  Patient Details  Name: Marcelle SmilingKeith W Wernette MRN: [id number] Date of Birth: Feb 20, 1966  Today's Date: 03/16/2016 OT Individual Time: 0800-0900 OT Individual Time Calculation (min): 60 min    Short Term Goals: Week 1:  OT Short Term Goal 1 (Week 1): Pt will be mod I with bed mobility as a precursor for ADLs  OT Short Term Goal 2 (Week 1): Pt will be educated on a functional strengthening HEP for LUE OT Short Term Goal 3 (Week 1): Pt will be min assist for toilet transfers OT Short Term Goal 4 (Week 1): Pt will be min assist for UB/LB dressing OT Short Term Goal 5 (Week 1): Pt will be educated on shower equipment for safety at home       Skilled Therapeutic Interventions/Progress Updates:    Engaged in OT skilled intervention for bed mobility, sitting, and standing balance, transfer, intellectual awareness, sustained attention.  Pt.lying in bed.  He stated he had a urine accident and had been cleaned up earlier.  He agreed to bath and dress.  Pt went from supine to EOB with increased time and min assist.  He sat EOB and used LUE to open drawers with min cues and pick out clothes.  All activities were slow and delayed processing time.  Pt dressed with educational cues to don weak side first.  He did sit to stand with min assist and transferred to chair.  Donned shoes and ties with no assist.  Left pt in chair .  Pt decides before OT leaves that he wants to get back in bed due to back pain.  Transferred to bed with min assist and left  call bell,phone within reach.     Therapy Documentation Precautions:  Precautions Precautions: Fall Precaution Comments:  (L hemiplegia) Restrictions Weight Bearing Restrictions: No Vital Signs: Therapy Vitals Temp: 97.6 F (36.4 C) Temp Source: Oral Pulse Rate: 70 Resp: 18 BP: 108/69 mmHg Patient Position (if appropriate): Sitting Oxygen Therapy SpO2: 100 % O2 Device: Not Delivered Pain: Pain Assessment Pain  Assessment: back 10 /10     See Function Navigator for Current Functional Status.   Therapy/Group: Individual Therapy    Humberto Sealsdwards, Judene Logue J 03/16/2016, 4:55 PM

## 2016-03-16 NOTE — Progress Notes (Signed)
Physical Therapy Session Note  Patient Details  Name: Brandon [name] MRN: [id number] Date of Birth: Aug 28, 1966  Today's Date: 03/16/2016 PT Individual Time: 1102-1201 PT Individual Time Calculation (min): 59 min   Short Term Goals: Week 1:  PT Short Term Goal 1 (Week 1): Pt will complete stand/squat pivot transfers & sit<>stand transfers with Min A.  PT Short Term Goal 2 (Week 1): Pt will ambulate with LRAD x 50 ft with Min A. PT Short Term Goal 3 (Week 1): Pt will complete bed mobility with Steady A.  Skilled Therapeutic Interventions/Progress Updates:    Gait in room  For 10 ft x 2 with min A and Max cues for improved step to gait pattern and increased attention to the LLE.  Patient performed Toilet transfer for bowl movement with mod A from PT with mod cues for use of rails in bathroom for increased safety with transfer. PT instructed patient in Self hygiene standing with LUE and mod A for improved positioning and increased Shoulder ROM. Patient was able to don pants standing with min A from PT and 1 UE support WC mobility for 12200ft with supervision A and Max cues for improve  R hemi technique. PT educated patient in use of BUE for WC propulsion with BUE. Patient unable to coordinate LUE for maintain straight trajectory for >3 ft with mod-max A from for proper UE movement.   Gait in gym for 3210ft with min A and max cues for AD management and increased step length and foot clearance on the LLE. Patient was noted to have incontinent bladder episode while performing gait training. PT returned patient to room and assisted patient to doff soiled clothes with sit<>stand with min A and min A to thread LLE through pant leg of underpants.   Patient left sitting in WC with call bell within reach and lap belt in place.    Therapy Documentation Precautions:  Precautions Precautions: Fall Precaution Comments:  (L hemiplegia) Restrictions Weight Bearing Restrictions: No General:   Vital  Signs: Therapy Vitals Pulse Rate: 61 Resp: 16 Patient Position (if appropriate): Lying Oxygen Therapy SpO2: 93 % O2 Device: Not Delivered Pain: Pain Assessment Pain Assessment: 0-10 Pain Score: 6  Pain Type: Chronic pain Pain Location: Back Pain Descriptors / Indicators: Aching Pain Frequency: Constant Pain Onset: On-going Patients Stated Pain Goal: 1 Pain Intervention(s): Medication (See eMAR) Multiple Pain Sites: No  See Function Navigator for Current Functional Status.   Therapy/Group: Individual Therapy  Golden Popustin E Adesuwa Osgood 03/16/2016, 12:51 PM

## 2016-03-17 ENCOUNTER — Inpatient Hospital Stay (HOSPITAL_COMMUNITY): Payer: Medicaid Other | Admitting: Physical Therapy

## 2016-03-17 DIAGNOSIS — G4700 Insomnia, unspecified: Secondary | ICD-10-CM

## 2016-03-17 LAB — GLUCOSE, CAPILLARY
GLUCOSE-CAPILLARY: 206 mg/dL — AB (ref 65–99)
Glucose-Capillary: 154 mg/dL — ABNORMAL HIGH (ref 65–99)
Glucose-Capillary: 159 mg/dL — ABNORMAL HIGH (ref 65–99)

## 2016-03-17 MED ORDER — TRAZODONE HCL 50 MG PO TABS
50.0000 mg | ORAL_TABLET | Freq: Every evening | ORAL | Status: DC | PRN
  Administered 2016-03-17 – 2016-03-18 (×2): 50 mg via ORAL
  Filled 2016-03-17 (×2): qty 1

## 2016-03-17 NOTE — Progress Notes (Signed)
[place] PHYSICAL MEDICINE & REHABILITATION     PROGRESS NOTE  Subjective/Complaints:  Poor sleep usually takes sleep med at home Denies any bowel or bladder or breathing issues keeping him up  ROS: Denies CP, SOB, nausea, vomiting, diarrhea.  Objective: Vital Signs: Blood pressure 148/79, pulse 59, temperature 97.8 F (36.6 C), temperature source Oral, resp. rate 20, height 6\' 1"  (1.854 m), weight 84.505 kg (186 lb 4.8 oz), SpO2 100 %. No results found.  Recent Labs  03/15/16 0547  WBC 7.8  HGB 8.4*  HCT 26.4*  PLT 608*    Recent Labs  03/15/16 0547  NA 132*  K 4.2  CL 100*  GLUCOSE 145*  BUN 8  CREATININE 0.63  CALCIUM 8.9   CBG (last 3)   Recent Labs  03/16/16 1132 03/16/16 1622 03/16/16 2048  GLUCAP 119* 165* 140*    Wt Readings from Last 3 Encounters:  03/17/16 84.505 kg (186 lb 4.8 oz)  03/11/16 85.1 kg (187 lb 9.8 oz)  04/12/15 92.987 kg (205 lb)    Physical Exam:  BP 148/79 mmHg  Pulse 59  Temp(Src) 97.8 F (36.6 C) (Oral)  Resp 20  Ht 6\' 1"  (1.854 m)  Wt 84.505 kg (186 lb 4.8 oz)  BMI 24.58 kg/m2  SpO2 100% Constitutional: He appears well-developed and well-nourished.  HENT: Normocephalic and atraumatic.  Eyes: EOM are normal. Right eye exhibits no discharge. Left eye exhibits no discharge.  Cardiovascular: Normal rate and regular rhythm.  Respiratory: Effort normal and breath sounds normal. No respiratory distress.  GI: Soft. Bowel sounds are normal. He exhibits no distension.  Musculoskeletal: He exhibits no edema or tenderness.  Neurological: He is alert and oriented 3.  Mood is flat but appropriate.  Motor: Right upper extremity/right lower extremity: 5/5 proximal to distal Left upper extremity: 4/5 delt, bicep, wrist, tricep, HI with motor apraxia Left lower extremity: 4/5 HF, KE, 4+/5 ADF/PF with apraxia Skin: Skin is warm and dry.  Psychiatric: He has a normal mood and affect. His behavior is  normal  Assessment/Plan: 1. Functional deficits secondary to right MCA territory infarct as well as history of CVA with residual weakness which require 3+ hours per day of interdisciplinary therapy in a comprehensive inpatient rehab setting. Physiatrist is providing close team supervision and 24 hour management of active medical problems listed below. Physiatrist and rehab team continue to assess barriers to discharge/monitor patient progress toward functional and medical goals.  Function:  Bathing Bathing position Bathing activity did not occur: Refused Position: Wheelchair/chair at sink  Bathing parts Body parts bathed by patient: Right arm, Left arm, Chest, Abdomen, Front perineal area, Buttocks, Right upper leg, Left upper leg Body parts bathed by helper: Buttocks, Back, Left lower leg, Right lower leg, Left upper leg, Right upper leg  Bathing assist Assist Level: Touching or steadying assistance(Pt > 75%)      Upper Body Dressing/Undressing Upper body dressing   What is the patient wearing?: Pull over shirt/dress     Pull over shirt/dress - Perfomed by patient: Thread/unthread right sleeve, Thread/unthread left sleeve, Put head through opening Pull over shirt/dress - Perfomed by helper: Pull shirt over trunk        Upper body assist Assist Level: Supervision or verbal cues      Lower Body Dressing/Undressing Lower body dressing   What is the patient wearing?: Pants, Underwear, Socks, Shoes Underwear - Performed by patient: Thread/unthread right underwear leg, Pull underwear up/down Underwear - Performed by helper: Thread/unthread left  underwear leg Pants- Performed by patient: Thread/unthread right pants leg, Pull pants up/down Pants- Performed by helper: Thread/unthread left pants leg Non-skid slipper socks- Performed by patient: Don/doff right sock Non-skid slipper socks- Performed by helper: Don/doff left sock Socks - Performed by patient: Don/doff right sock, Don/doff  left sock   Shoes - Performed by patient: Don/doff right shoe, Fasten right, Fasten left Shoes - Performed by helper: Don/doff left shoe          Lower body assist Assist for lower body dressing: Touching or steadying assistance (Pt > 75%)      Toileting Toileting Toileting activity did not occur: N/A   Toileting steps completed by helper: Adjust clothing prior to toileting, Performs perineal hygiene, Adjust clothing after toileting    Toileting assist Assist level: Two helpers (per [employer] CorporationJobelle Mesias, NT)   Transfers Chair/bed transfer   Chair/bed transfer method: Stand pivot Chair/bed transfer assist level: Touching or steadying assistance (Pt > 75%) Chair/bed transfer assistive device: Bedrails Mechanical lift: Landscape architecttedy   Locomotion Ambulation     Max distance: 5510ft Assist level: Touching or steadying assistance (Pt > 75%)   Wheelchair   Type: Manual Max wheelchair distance: 14500ft Assist Level: Supervision or verbal cues  Cognition Comprehension Comprehension assist level: Understands basic 90% of the time/cues < 10% of the time  Expression Expression assist level: Expresses basic 90% of the time/requires cueing < 10% of the time.  Social Interaction Social Interaction assist level: Interacts appropriately 90% of the time - Needs monitoring or encouragement for participation or interaction.  Problem Solving Problem solving assist level: Solves basic 50 - 74% of the time/requires cueing 25 - 49% of the time  Memory Memory assist level: Recognizes or recalls 75 - 89% of the time/requires cueing 10 - 24% of the time    Medical Problem List and Plan: 1. Altered mental status with left sided weakness secondary to right MCA territory infarct as well as history of CVA September 2013 with residual left-sided weakness -Cont CIR PT, OT, SLP 2. DVT Prophylaxis/Anticoagulation: SCDs. Monitor for any signs of DVT 3. Pain Management: Tylenol as needed 4. Mood: BuSpar 5 mg  twice a day 5. Neuropsych: This patient is capable of making decisions on his own behalf. 6. Skin/Wound Care: Routine skin checks 7. Fluids/Electrolytes/Nutrition: Routine I&O   On a mechanical soft diet due to pancreatitis. Will Advance diet. 8. Upper GI bleed. Follow-up per gastroenterology. Continue Protonix  Hemoglobin 8.4 on 6/23 (stable). 9. Ischemic cardiac heart disease. No chest pain or shortness of breath. Aspirin 81 mg daily. No Plavix at this time due to GI bleed 10. Hypertension. Corgard 40 mg daily. Follow with increased mobility 11. Hyponatremia. 132 on 6/23 (stable) 12. Diabetes mellitus. Hemoglobin A1c 5.6. Lantus insulin 5 units daily. Check blood sugars before meals and at bedtime.   Will continue to monitor and consider further adjustments with increased physical activity.   CBG (last 3)   Recent Labs  03/16/16 1132 03/16/16 1622 03/16/16 2048  GLUCAP 119* 165* 140*    13. Pancreatitis. Continue Creon 24,000 units 3 times a day 14. Alcohol tobacco abuse. Nicoderm patch. Provide counseling 15. Klebsiella UTI. Ceftin 500 mg twice daily 03/12/2016 5 days and stop 16.  Insomnia- add trazodone, avoid benzos with hx of ETOH abuse LOS (Days) 5 A FACE TO FACE EVALUATION WAS PERFORMED  Erick ColaceKIRSTEINS,ANDREW E 03/17/2016 8:39 AM

## 2016-03-17 NOTE — Progress Notes (Signed)
Physical Therapy Session Note  Patient Details  Name: Brandon [name] W Hafley MRN: [id number] Date of Birth: [birth year]  Today's Date: 03/17/2016 PT Individual Time: [id number] PT Individual Time Calculation (min): 29 min   Short Term Goals: Week 1:  PT Short Term Goal 1 (Week 1): Pt will complete stand/squat pivot transfers & sit<>stand transfers with Min A.  PT Short Term Goal 2 (Week 1): Pt will ambulate with LRAD x 50 ft with Min A. PT Short Term Goal 3 (Week 1): Pt will complete bed mobility with Steady A.  Skilled Therapeutic Interventions/Progress Updates:    Pt received in bed noting 8-9/10 back pain but agreeable to PT; notified RN of pt's c/o pain. Pt transferred supine>sit with supervision, donned R shoe independently but required total A for donning L shoe. Sit<>Stands completed with min A & gait training x 25 ft with RW & Mod A. Pt with difficulty advancing & clearing L foot, requiring manual facilitation to do so. Pt with flexed trunk posture but able to briefly correct with manual facilitation & cuing; pt unable to maintain upright posture reporting he ambulates like this at baseline. Completed 10x sit<>stand transfers with Min A without BUE support from lowest bed height for BLE strengthening. Pt required cuing to continue participating in task as he would take rest break between each trial. Pt returned supine with supervision A for cuing to place LLE on bed & to utilized bed rails to pull himself towards head of bed. Pt reported this is the first time he has not had a bladder incontinence episode during therapy. Notified RN of pt's need for timed toileting. At end of session pt left in bed with alarm set & all needs within reach.  Therapy Documentation Precautions:  Precautions Precautions: Fall Precaution Comments:  (L hemiplegia) Restrictions Weight Bearing Restrictions: No  Pain: Pain Assessment Pain Assessment: 0-10 Pain Score: 9  Pain Location: Back Pain Intervention(s): RN  made aware;Repositioned;Ambulation/increased activity     See Function Navigator for Current Functional Status.   Therapy/Group: Individual Therapy  Sandi MariscalVictoria M Renetta Suman 03/17/2016, 12:47 PM

## 2016-03-17 NOTE — Plan of Care (Signed)
Problem: RH BLADDER ELIMINATION Goal: RH STG MANAGE BLADDER WITH ASSISTANCE STG Manage Bladder With Min Assistance  Outcome: Not Progressing Total assist- incontinent of bladder.

## 2016-03-18 ENCOUNTER — Inpatient Hospital Stay (HOSPITAL_COMMUNITY): Payer: Medicaid Other | Admitting: Physical Therapy

## 2016-03-18 ENCOUNTER — Inpatient Hospital Stay (HOSPITAL_COMMUNITY): Payer: Medicaid Other | Admitting: Occupational Therapy

## 2016-03-18 ENCOUNTER — Encounter (HOSPITAL_COMMUNITY): Payer: Medicaid Other

## 2016-03-18 ENCOUNTER — Inpatient Hospital Stay (HOSPITAL_COMMUNITY): Payer: Medicaid Other | Admitting: Speech Pathology

## 2016-03-18 DIAGNOSIS — G479 Sleep disorder, unspecified: Secondary | ICD-10-CM

## 2016-03-18 LAB — GLUCOSE, CAPILLARY
GLUCOSE-CAPILLARY: 136 mg/dL — AB (ref 65–99)
Glucose-Capillary: 119 mg/dL — ABNORMAL HIGH (ref 65–99)
Glucose-Capillary: 157 mg/dL — ABNORMAL HIGH (ref 65–99)
Glucose-Capillary: 211 mg/dL — ABNORMAL HIGH (ref 65–99)

## 2016-03-18 MED ORDER — INSULIN GLARGINE 100 UNIT/ML ~~LOC~~ SOLN
7.0000 [IU] | Freq: Every day | SUBCUTANEOUS | Status: DC
  Administered 2016-03-19 – 2016-03-23 (×5): 7 [IU] via SUBCUTANEOUS
  Filled 2016-03-18 (×5): qty 0.07

## 2016-03-18 NOTE — Progress Notes (Signed)
Occupational Therapy Session Note  Patient Details  Name: Brandon [name] MRN: [id number] Date of Birth: 07-15-1966  Today's Date: 03/18/2016 OT Individual Time: 1515-1600 OT Individual Time Calculation (min): 45 min    Short Term Goals: Week 1:  OT Short Term Goal 1 (Week 1): Pt will be mod I with bed mobility as a precursor for ADLs  OT Short Term Goal 2 (Week 1): Pt will be educated on a functional strengthening HEP for LUE OT Short Term Goal 3 (Week 1): Pt will be min assist for toilet transfers OT Short Term Goal 4 (Week 1): Pt will be min assist for UB/LB dressing OT Short Term Goal 5 (Week 1): Pt will be educated on shower equipment for safety at home  Skilled Therapeutic Interventions/Progress Updates:    Upon entering the room, pt seated in wheelchair when therapist entered the room. Pt with no c/o pain this session. Pt hade clothing items laid on bed and requesting to put on shirt and pants as he was having visitor this afternoon. Pt declined bathing. Pt donned UB pull over shirt with mod A to thread L sleeve and pull down trunk. Pt unable to thread L LE into shorts without assistance and required mod A for standing balance for LB clothing management. Pt propelled wheelchair with min A 100' down hall to ask RN question regarding IV. Pt needing max cues and min assist for safety as he was running into obstacles on L side. Pt returned to room in same manner. Sit <>stand from wheelchair with min A and mod A for weight shift and L LE advancement to ambulate 10' with RW to bed. OT assisting pt with doffing B shoes and pt performed sit >supine with min A for L LE. Pt supine in bed with bed alarm activated and call bell within reach.   Therapy Documentation Precautions:  Precautions Precautions: Fall Precaution Comments:  (L hemiplegia) Restrictions Weight Bearing Restrictions: No General:   Vital Signs: Therapy Vitals Temp: 97.7 F (36.5 C) Temp Source: Oral Pulse Rate:  63 Resp: 16 BP: 102/67 mmHg Patient Position (if appropriate): Sitting Oxygen Therapy SpO2: 100 % O2 Device: Not Delivered  See Function Navigator for Current Functional Status.   Therapy/Group: Individual Therapy  Lowella Gripittman, Elishua Radford L 03/18/2016, 4:18 PM

## 2016-03-18 NOTE — Progress Notes (Signed)
Physical Therapy Session Note  Patient Details  Name: Marcelle SmilingKeith W Spike MRN: [id number] Date of Birth: October 11, 1965  Today's Date: 03/18/2016 PT Individual Time: 1300-1415 PT Individual Time Calculation (min): 75 min   Short Term Goals: Week 1:  PT Short Term Goal 1 (Week 1): Pt will complete stand/squat pivot transfers & sit<>stand transfers with Min A.  PT Short Term Goal 2 (Week 1): Pt will ambulate with LRAD x 50 ft with Min A. PT Short Term Goal 3 (Week 1): Pt will complete bed mobility with Steady A.  Skilled Therapeutic Interventions/Progress Updates:    Pt received in w/c & agreeable to PT, noting 8/10 back pain & RN notified. Pt able to don R shoe independently but required total A for L shoe. Pt self propelled w/c x 100 ft to ortho gym with mod A as pt demonstrates L neglect of LUE & spatial awareness in hallway. Pt completed car transfer with RW & Min A, requiring cuing for sequencing to turn and back up to car, as well as for hand placement. Pt utilized UE to transfer LLE in/out of car. Gait training during session x 10 ft + 20 ft + 30 ft with RW & Min/mod A with PT facilitating L foot clearance at knee. Pt with decreased foot clearance, L hip flexion & knee flexion, and decreased step length. Stair negotiation completed with B rails leading with LLE for LLE strengthening purposes. Pt required cuing to look at LUE and advance it, as pt was unaware of hand in space & falling behind on rail. Cybex kinetron utilized in sitting, increasing up to 30 cm/sec in multiple bouts to pt fatigue. Pt on nu-step, increasing to level 6 x 10 minutes with L hip adduction piece as pt unable to maintain LLE neutral alignment independently. Exercises focused on BLE strengthening, endurance training & coordination for reciprocal movements. At end of session pt returned to room & left in w/c with all needs within reach, QRB, & alarm belt in place.  Therapy Documentation Precautions:  Precautions Precautions:  Fall Precaution Comments:  (L hemiplegia) Restrictions Weight Bearing Restrictions: No  Pain: Pain Assessment Pain Assessment: 0-10 Pain Score: 8  Pain Location: Back Pain Intervention(s): RN made aware   See Function Navigator for Current Functional Status.   Therapy/Group: Individual Therapy  Sandi MariscalVictoria M Clotile Whittington 03/18/2016, 7:49 AM

## 2016-03-18 NOTE — Progress Notes (Signed)
Speech Language Pathology Daily Session Note  Patient Details  Name: Marcelle SmilingKeith W Pavao MRN: [id number] Date of Birth: 04/16/1966  Today's Date: 03/18/2016 SLP Individual Time: 1030-1130 SLP Individual Time Calculation (min): 60 min  Short Term Goals: Week 1: SLP Short Term Goal 1 (Week 1): Patient will verbally identify 2 physical and 1 cognitive deficits post CVA with Mod assist question cues SLP Short Term Goal 2 (Week 1): Patient will request help as needed during basic and familiar problem solving takss with Max assistmultimodal cues  SLP Short Term Goal 3 (Week 1): Patient will attend to left upper extremity and utilize during familiar tasks with Mod assist verbal cues SLP Short Term Goal 4 (Week 1): Patient will alternate attention for 5 minutes with Min assist verbal cues for redirection  SLP Short Term Goal 5 (Week 1): Patient will utilize extenral aids to assist with recall of new information with Min assist verbal cues   Skilled Therapeutic Interventions:  Pt was seen for skilled ST targeting cognitive goals.  SLP facilitated the session with a medication management task targeting functional problem solving.  Pt was able to recall 4 of his currently scheduled medications which were familiar to him from prior to hospitalization with min assist question cues.  Pt required mod-max assist verbal cues to recognize and correct mistakes after organizing pills of varying dosages and frequencies into a pill box.  Pt was able to sustain his attention to task for its duration (45 minutes) with supervision verbal cues for redirection due to delayed processing speed.  Pt was returned to room and left in wheelchair with all bell within reach, quick release belt and chair alarm donned, and call bell left within reach.  Continue per current plan of care.    Function:  Eating Eating                 Cognition Comprehension Comprehension assist level: Follows basic conversation/direction with no  assist  Expression   Expression assist level: Expresses basic needs/ideas: With extra time/assistive device  Social Interaction Social Interaction assist level: Interacts appropriately 90% of the time - Needs monitoring or encouragement for participation or interaction.  Problem Solving Problem solving assist level: Solves basic 50 - 74% of the time/requires cueing 25 - 49% of the time  Memory Memory assist level: Recognizes or recalls 75 - 89% of the time/requires cueing 10 - 24% of the time    Pain Pain Assessment Pain Assessment: No/denies pain    Therapy/Group: Individual Therapy  Ethan Kasperski, Melanee SpryNicole L 03/18/2016, 1:09 PM

## 2016-03-18 NOTE — Progress Notes (Signed)
[place] PHYSICAL MEDICINE & REHABILITATION     PROGRESS NOTE  Subjective/Complaints:  Patient sitting up in bed this morning eating breakfast. He appears to have some mild difficulty with coordination and cutting his breakfast.  ROS: Denies CP, SOB, nausea, vomiting, diarrhea.  Objective: Vital Signs: Blood pressure 157/88, pulse 60, temperature 97.5 F (36.4 C), temperature source Oral, resp. rate 18, height 6\' 1"  (1.854 m), weight 83.371 kg (183 lb 12.8 oz), SpO2 100 %. No results found. No results for input(s): WBC, HGB, HCT, PLT in the last 72 hours. No results for input(s): NA, K, CL, GLUCOSE, BUN, CREATININE, CALCIUM in the last 72 hours.  Invalid input(s): CO CBG (last 3)   Recent Labs  03/17/16 1636 03/17/16 2045 03/18/16 0636  GLUCAP 159* 206* 119*    Wt Readings from Last 3 Encounters:  03/18/16 83.371 kg (183 lb 12.8 oz)  03/11/16 85.1 kg (187 lb 9.8 oz)  04/12/15 92.987 kg (205 lb)    Physical Exam:  BP 157/88 mmHg  Pulse 60  Temp(Src) 97.5 F (36.4 C) (Oral)  Resp 18  Ht 6\' 1"  (1.854 m)  Wt 83.371 kg (183 lb 12.8 oz)  BMI 24.25 kg/m2  SpO2 100% Constitutional: He appears well-developed and well-nourished.  HENT: Normocephalic and atraumatic.  Eyes: EOM are normal. Right eye exhibits no discharge. Left eye exhibits no discharge.  Cardiovascular: Normal rate and regular rhythm.  Respiratory: Effort normal and breath sounds normal. No respiratory distress.  GI: Soft. Bowel sounds are normal. He exhibits no distension.  Musculoskeletal: He exhibits no edema or tenderness.  Neurological: He is alert and oriented 3.  Mood is flat but appropriate.  Motor: Right upper extremity/right lower extremity: 5/5 proximal to distal Left upper extremity: 4/5 delt, bicep, wrist, tricep, HI with motor apraxia Left lower extremity: 4/5 HF, KE, 4+/5 ADF/PF with apraxia Skin: Skin is warm and dry.  Psychiatric: He has a normal mood and affect. His behavior is  normal  Assessment/Plan: 1. Functional deficits secondary to right MCA territory infarct as well as history of CVA with residual weakness which require 3+ hours per day of interdisciplinary therapy in a comprehensive inpatient rehab setting. Physiatrist is providing close team supervision and 24 hour management of active medical problems listed below. Physiatrist and rehab team continue to assess barriers to discharge/monitor patient progress toward functional and medical goals.  Function:  Bathing Bathing position Bathing activity did not occur: Refused Position: Wheelchair/chair at sink  Bathing parts Body parts bathed by patient: Right arm, Left arm, Chest, Abdomen, Front perineal area, Buttocks, Right upper leg, Left upper leg Body parts bathed by helper: Buttocks, Back, Left lower leg, Right lower leg, Left upper leg, Right upper leg  Bathing assist Assist Level: Touching or steadying assistance(Pt > 75%)      Upper Body Dressing/Undressing Upper body dressing   What is the patient wearing?: Pull over shirt/dress     Pull over shirt/dress - Perfomed by patient: Thread/unthread right sleeve, Thread/unthread left sleeve, Put head through opening Pull over shirt/dress - Perfomed by helper: Pull shirt over trunk        Upper body assist Assist Level: Supervision or verbal cues      Lower Body Dressing/Undressing Lower body dressing   What is the patient wearing?: Pants, Underwear, Socks, Shoes Underwear - Performed by patient: Thread/unthread right underwear leg, Pull underwear up/down Underwear - Performed by helper: Thread/unthread left underwear leg Pants- Performed by patient: Thread/unthread right pants leg, Pull pants up/down  Pants- Performed by helper: Thread/unthread left pants leg Non-skid slipper socks- Performed by patient: Don/doff right sock Non-skid slipper socks- Performed by helper: Don/doff left sock Socks - Performed by patient: Don/doff right sock, Don/doff  left sock   Shoes - Performed by patient: Don/doff right shoe, Fasten right, Fasten left Shoes - Performed by helper: Don/doff left shoe          Lower body assist Assist for lower body dressing: Touching or steadying assistance (Pt > 75%)      Toileting Toileting Toileting activity did not occur: N/A   Toileting steps completed by helper: Adjust clothing prior to toileting, Performs perineal hygiene, Adjust clothing after toileting    Toileting assist Assist level: Two helpers (per [employer]Jobelle Mesias, NT)   Transfers Chair/bed transfer   Chair/bed transfer method: Stand pivot Chair/bed transfer assist level: Touching or steadying assistance (Pt > 75%) Chair/bed transfer assistive device: Bedrails Mechanical lift: Landscape architecttedy   Locomotion Ambulation     Max distance: 25 ft Assist level: Moderate assist (Pt 50 - 74%)   Wheelchair   Type: Manual Max wheelchair distance: 15200ft Assist Level: Supervision or verbal cues  Cognition Comprehension Comprehension assist level: Understands complex 90% of the time/cues 10% of the time  Expression Expression assist level: Expresses complex 90% of the time/cues < 10% of the time  Social Interaction Social Interaction assist level: Interacts appropriately 90% of the time - Needs monitoring or encouragement for participation or interaction.  Problem Solving Problem solving assist level: Solves basic 50 - 74% of the time/requires cueing 25 - 49% of the time  Memory Memory assist level: Recognizes or recalls 75 - 89% of the time/requires cueing 10 - 24% of the time    Medical Problem List and Plan: 1. Altered mental status with left sided weakness secondary to right MCA territory infarct as well as history of CVA September 2013 with residual left-sided weakness -Cont CIR  2. DVT Prophylaxis/Anticoagulation: SCDs. Monitor for any signs of DVT 3. Pain Management: Tylenol as needed 4. Mood: Buspar 5 mg twice a day 5. Neuropsych: This  patient is capable of making decisions on his own behalf. 6. Skin/Wound Care: Routine skin checks 7. Fluids/Electrolytes/Nutrition: Routine I&O   Diet advanced to a heart healthy: Modified /23. Pain 8. Upper GI bleed. Follow-up per gastroenterology. Continue Protonix  Hemoglobin 8.4 on 6/23 (stable). 9. Ischemic cardiac heart disease. No chest pain or shortness of breath. Aspirin 81 mg daily. No Plavix at this time due to GI bleed 10. Hypertension. Corgard 40 mg daily. Follow with increased mobility 11. Hyponatremia. 132 on 6/23 (stable) 12. Diabetes mellitus. Hemoglobin A1c 5.6.   Lantus insulin 5 units daily.   Check blood sugars before meals and at bedtime.   Will continue to monitor and consider further adjustments with increased physical activity.   CBG (last 3)   Recent Labs  03/17/16 1636 03/17/16 2045 03/18/16 0636  GLUCAP 159* 206* 119*  13. Pancreatitis. Continue Creon 24,000 units 3 times a day 14. Alcohol tobacco abuse. Nicoderm patch. Provide counseling 15. Klebsiella UTI. Completed course of Ceftin. 16.  Sleep Disturbance- added trazodone, avoid benzos with hx of ETOH abuse  LOS (Days) 6 A FACE TO FACE EVALUATION WAS PERFORMED  Erika Slaby Karis Jubanil Hawraa Stambaugh 03/18/2016 8:26 AM

## 2016-03-19 ENCOUNTER — Inpatient Hospital Stay (HOSPITAL_COMMUNITY): Payer: Medicaid Other | Admitting: Physical Therapy

## 2016-03-19 ENCOUNTER — Inpatient Hospital Stay (HOSPITAL_COMMUNITY): Payer: Medicaid Other | Admitting: Speech Pathology

## 2016-03-19 ENCOUNTER — Inpatient Hospital Stay (HOSPITAL_COMMUNITY): Payer: Medicaid Other | Admitting: Occupational Therapy

## 2016-03-19 LAB — GLUCOSE, CAPILLARY
GLUCOSE-CAPILLARY: 130 mg/dL — AB (ref 65–99)
GLUCOSE-CAPILLARY: 139 mg/dL — AB (ref 65–99)
Glucose-Capillary: 140 mg/dL — ABNORMAL HIGH (ref 65–99)
Glucose-Capillary: 139 mg/dL — ABNORMAL HIGH (ref 65–99)

## 2016-03-19 MED ORDER — TRAZODONE HCL 50 MG PO TABS
100.0000 mg | ORAL_TABLET | Freq: Every evening | ORAL | Status: DC | PRN
  Administered 2016-03-19 – 2016-03-28 (×9): 100 mg via ORAL
  Filled 2016-03-19 (×9): qty 2

## 2016-03-19 NOTE — Patient Care Conference (Signed)
Inpatient RehabilitationTeam Conference and Plan of Care Update Date: 03/19/2016   Time: 10:30 AM    Patient Name: Brandon [name]      Medical Record Number: [id number]  Date of Birth: 29-Jun-1966 Sex: Male         Room/Bed: 4M09C/4M09C-01 Payor Info: Payor: MEDICAID [place] / Plan: MEDICAID  ACCESS / Product Type: *No Product type* /    Admitting Diagnosis: [facility] CVA  Admit Date/Time:  03/12/2016  6:15 PM Admission Comments: No comment available   Primary Diagnosis:  Right middle cerebral artery stroke [facility](HCC) Principal Problem: Right middle cerebral artery stroke [facility](HCC)  Patient Active Problem List   Diagnosis Date Noted  . Sleep disturbance   . Essential hypertension, benign   . Upper GI bleed   . 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  . Fatty liver   . Tobacco abuse   . Diabetes mellitus type 2 in nonobese (HCC)   . History of CVA with residual deficit   . Acute lower UTI   . Tachycardia   . Chronic alcoholic pancreatitis (HCC)   . Hyponatremia 03/09/2016  . 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  . Acute blood loss anemia 03/09/2016  . Gastric varices   . Alcohol abuse   . Left-sided neglect   . Severe anemia   . UGIB (upper gastrointestinal bleed)   . Pressure ulcer 03/06/2016  . Acute encephalopathy   . CVA (cerebral infarction) 03/05/2016  . Carotid stenosis 04/06/2015  . CAD in native artery 03/16/2015  . Unstable angina pectoris (HCC) 03/15/2015  . Neck pain 10/20/2013  . Insomnia 12/29/2012  . 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 Occlusion and stenosis of carotid artery with cerebral infarction 12/11/2012  . Unspecified cerebral artery occlusion with cerebral infarction 12/11/2012  . Fatigue 11/26/2012  . Hallux valgus 06/25/2012  . Preventative health care 06/25/2012  . Alcohol Dependence 06/18/2012  . Smoking 06/16/2012  . Bilateral extracranial carotid artery stenosis   . Coronary Artery  Disease 06/13/2012  . Ischemic Stroke 06/13/2012  . Hyperlipidemia   . Hypertension 02/25/2011  . GERD (gastroesophageal reflux disease) 02/25/2011  . Chronic Pancreatitis. 02/25/2011  . Hepatic steatosis 02/25/2011    Expected Discharge Date: Expected Discharge Date: 03/29/16  Team Members Present: Physician leading conference: Dr. Maryla MorrowAnkit Patel Social Worker Present: Dossie DerBecky Tanner Vigna, LCSW Nurse Present: Daryll BrodVerna Washington, RN PT Present: Aleda GranaVictoria Miller, PT OT Present: Roney MansJennifer Smith, OT SLP Present: Claudell KyleKara Turner, SLP PPS Coordinator present : Tora DuckMarie Noel, RN, CRRN     Current Status/Progress Goal Weekly Team Focus  Medical   Altered mental status with left sided weakness secondary to right MCA territory infarct as well as history of CVA September 2013 with residual left-sided weakness  Safety, cognition, mobility, recall  see above   Bowel/Bladder   last bm 6/26. can be incontinent at times. uses urinal to void  remain continent  monitor bowel and bladder.   Swallow/Nutrition/ Hydration             ADL's   Overall min to mod assist  overall supervision  ADL retraining, Pt/family education, sit to/from stands, functional mobility/ambulation using RW, LUE functional use, and overall activity tolerance/endurance   Mobility   max distance 25 ft with RW & mod A, decreased safety awareness, supervision for bed mobility, min A for sit<>Stand  supervision for standing balance, supervision for ambulation with LRAD, supervision to negotiate 5 steps with B rails  LLE strengthening, gait training, neuro  re-ed, balance training, stair training   Communication             Safety/Cognition/ Behavioral Observations  mod assist   min assist  monitor for safety.    Pain   denies pain  pain less than 2  monitor for pain q shift prn   Skin   scabs to bilateral knees.  no new skin  breakdown  monitor skin daily prn      *See Care Plan and progress notes for long and short-term  goals.  Barriers to Discharge: DM, hyponatremia, sleep disturbance,     Possible Resolutions to Barriers:  Optimize sleep meds, optimize DM meds, follow labs    Discharge Planning/Teaching Needs:    Home with fiancee-[name] who between she and family can provide 24 hr supervision to pt. Working on disability appeal.     Team Discussion:  Making progress, cognition issues from previous CVA. Try AFO today. L-neglect issues and needs re-direction, along with delay in processing. Seen by neuro-psych yesterday. RN-trying timed toileting for continence issues.  Revisions to Treatment Plan:  None   Continued Need for Acute Rehabilitation Level of Care: The patient requires daily medical management by a physician with specialized training in physical medicine and rehabilitation for the following conditions: Daily direction of a multidisciplinary physical rehabilitation program to ensure safe treatment while eliciting the highest outcome that is of practical value to the patient.: Yes Daily medical management of patient stability for increased activity during participation in an intensive rehabilitation regime.: Yes Daily analysis of laboratory values and/or radiology reports with any subsequent need for medication adjustment of medical intervention for : Neurological problems;Diabetes problems;Mood/behavior problems;Other  Lucy ChrisDupree, Shaquil Aldana G 03/19/2016, 3:37 PM

## 2016-03-19 NOTE — Progress Notes (Signed)
Physical Therapy Session Note  Patient Details  Name: Brandon [name] W Taney MRN: [id number] Date of Birth: 29-Jan-1966  Today's Date: 03/19/2016 PT Individual Time: 1005-1035 and [id number] PT Individual Time Calculation (min): 30 min and 79 min  Short Term Goals: Week 1:  PT Short Term Goal 1 (Week 1): Pt will complete stand/squat pivot transfers & sit<>stand transfers with Min A.  PT Short Term Goal 2 (Week 1): Pt will ambulate with LRAD x 50 ft with Min A. PT Short Term Goal 3 (Week 1): Pt will complete bed mobility with Steady A.  Skilled Therapeutic Interventions/Progress Updates:    Treatment 1: Pt received in w/c & agreeable to PT, noting 7/10 back pain but does not grimace or demonstrate behavior consistent with this. Pt self propelled w/c x 30 ft with LUE for forced use & neuro re-ed with PT facilitating greater reach on wheel but pt with c/o pain. Pt transported to [employer] gym and educated on use of dynavision. Pt transferred to standing with steady A but required Min A for standing without BUE support and maximum multimodal cuing for posture. Pt with significant trunk, hip & knee flexion in standing and difficult to correct. Attempted to have pt perform dynavision activity with LUE but pt reported need to use restroom. Transported pt back to room in w/c total A; gait training in room x 15 ft to bathroom with RW & min A. Pt requires heavy cuing to get closer to toilet and w/c before turning around & transferring to sitting. Pt with continued difficulty with L foot clearance. Once on toilet pt observed to have incontinent urine episode and unable to void on toilet. Gait training x 15 ft back to w/c in same manner as noted above. At end of session pt left in w/c with QRB & alarm belt in place, all needs within reach.  Treatment 2: Pt received in w/c & agreeable to PT, denying c/o pain. Transported pt to gym via w/c total A for time management. Utilized dynavision in standing without BUE support & min A  with LUE only for neuro re-ed. Pt with inconsistent reaction time to all quadrants. Pt with significant hip, knee & trunk flexion and unable to correct even with maximum tactile cuing from PT. Pt performed dynamic standing task, reaching for horseshoes with LUE while standing on wedge for plantarflexor stretch. Pt positioned in prone on mat table x 7 minutes for extensor stretch, with pt required maximum cuing and mod A to transfer to prone. Performed horseshoe task a 2nd time with improved upright posture but still requiring multimodal cuing to correct & maintain. Provided pt with L walk-on AFO for gait training & pt with improvement in L foot clearance. Gait training x 30 ft + 55 ft + 60 ft with RW & min A beginning to fade to close supervision. Pt requires maximum cuing to ambulate within base of RW & for posture. Attempted w/c mobility x 15 ft but with L neglect & weak LUE requiring mod A for linear path and to avoid running into wall. At end of session pt left in w/c in room with all needs within reach & QRB & alarm belt donned.  Therapy Documentation Precautions:  Precautions Precautions: Fall Precaution Comments:  (L hemiplegia) Restrictions Weight Bearing Restrictions: No  Pain: Pain Assessment Pain Assessment: 0-10 Pain Score: 7  Pain Location: Back Pain Intervention(s): Repositioned;Ambulation/increased activity   See Function Navigator for Current Functional Status.   Therapy/Group: Individual Therapy  Sandi MariscalVictoria M Valta Dillon  03/19/2016, 7:44 AM

## 2016-03-19 NOTE — Progress Notes (Signed)
[facility] PHYSICAL MEDICINE & REHABILITATION     PROGRESS NOTE  Subjective/Complaints:  Patient sitting up in bed this his chair.  Slept fairly, asks to increase trazodone.   ROS: Denies CP, SOB, nausea, vomiting, diarrhea.  Objective: Vital Signs: Blood pressure 132/75, pulse 111, temperature 97.5 F (36.4 C), temperature source Oral, resp. rate 16, height 6\' 1"  (1.854 m), weight 84.324 kg (185 lb 14.4 oz), SpO2 84 %. No results found. No results for input(s): WBC, HGB, HCT, PLT in the last 72 hours. No results for input(s): NA, K, CL, GLUCOSE, BUN, CREATININE, CALCIUM in the last 72 hours.  Invalid input(s): CO CBG (last 3)   Recent Labs  03/18/16 1655 03/18/16 2107 03/19/16 0659  GLUCAP 211* 136* 130*    Wt Readings from Last 3 Encounters:  03/19/16 84.324 kg (185 lb 14.4 oz)  03/11/16 85.1 kg (187 lb 9.8 oz)  04/12/15 92.987 kg (205 lb)    Physical Exam:  BP 132/75 mmHg  Pulse 111  Temp(Src) 97.5 F (36.4 C) (Oral)  Resp 16  Ht 6\' 1"  (1.854 m)  Wt 84.324 kg (185 lb 14.4 oz)  BMI 24.53 kg/m2  SpO2 84% Constitutional: He appears well-developed and well-nourished.  HENT: Normocephalic and atraumatic.  Eyes: EOM are normal. Right eye exhibits no discharge. Left eye exhibits no discharge.  Cardiovascular: Normal rate and regular rhythm.  Respiratory: Effort normal and breath sounds normal. No respiratory distress.  GI: Soft. Bowel sounds are normal. He exhibits no distension.  Musculoskeletal: He exhibits no edema or tenderness.  Neurological: He is alert and oriented 3.  Mood is flat but appropriate.  Motor: Right upper extremity/right lower extremity: 5/5 proximal to distal Left upper extremity: 4/5 delt, bicep, wrist, tricep, HI with motor apraxia Left lower extremity: 4/5 HF, KE, 4+/5 ADF/PF with apraxia Skin: Skin is warm and dry.  Psychiatric: He has a normal mood and affect. His behavior is normal  Assessment/Plan: 1. Functional deficits  secondary to right MCA territory infarct as well as history of CVA with residual weakness which require 3+ hours per day of interdisciplinary therapy in a comprehensive inpatient rehab setting. Physiatrist is providing close team supervision and 24 hour management of active medical problems listed below. Physiatrist and rehab team continue to assess barriers to discharge/monitor patient progress toward functional and medical goals.  Function:  Bathing Bathing position Bathing activity did not occur: Refused Position: Wheelchair/chair at sink  Bathing parts Body parts bathed by patient: Right arm, Left arm, Chest, Abdomen, Front perineal area, Buttocks, Right upper leg, Left upper leg Body parts bathed by helper: Buttocks, Back, Left lower leg, Right lower leg, Left upper leg, Right upper leg  Bathing assist Assist Level: Touching or steadying assistance(Pt > 75%)      Upper Body Dressing/Undressing Upper body dressing   What is the patient wearing?: Pull over shirt/dress     Pull over shirt/dress - Perfomed by patient: Thread/unthread right sleeve, Put head through opening Pull over shirt/dress - Perfomed by helper: Thread/unthread left sleeve, Pull shirt over trunk        Upper body assist Assist Level:  (mod A)      Lower Body Dressing/Undressing Lower body dressing   What is the patient wearing?: Pants Underwear - Performed by patient: Thread/unthread right underwear leg, Pull underwear up/down Underwear - Performed by helper: Thread/unthread left underwear leg Pants- Performed by patient: Thread/unthread right pants leg, Pull pants up/down Pants- Performed by helper: Thread/unthread left pants leg Non-skid slipper  socks- Performed by patient: Don/doff right sock Non-skid slipper socks- Performed by helper: Don/doff left sock Socks - Performed by patient: Don/doff right sock, Don/doff left sock   Shoes - Performed by patient: Don/doff right shoe, Fasten right, Fasten  left Shoes - Performed by helper: Don/doff left shoe          Lower body assist Assist for lower body dressing:  (mod A)      Toileting Toileting Toileting activity did not occur: N/A   Toileting steps completed by helper: Adjust clothing prior to toileting, Performs perineal hygiene, Adjust clothing after toileting    Toileting assist Assist level: Two helpers (per Brunswick CorporationJobelle Mesias, NT)   Transfers Chair/bed transfer   Chair/bed transfer method: Stand pivot Chair/bed transfer assist level: Moderate assist (Pt 50 - 74%/lift or lower) Chair/bed transfer assistive device: Bedrails Mechanical lift: Landscape architecttedy   Locomotion Ambulation     Max distance: 6130ft Assist level: Touching or steadying assistance (Pt > 75%)   Wheelchair   Type: Manual Max wheelchair distance: 100 ft Assist Level: Moderate assistance (Pt 50 - 74%)  Cognition Comprehension Comprehension assist level: Follows complex conversation/direction with no assist  Expression Expression assist level: Expresses complex ideas: With extra time/assistive device  Social Interaction Social Interaction assist level: Interacts appropriately 90% of the time - Needs monitoring or encouragement for participation or interaction.  Problem Solving Problem solving assist level: Solves basic 50 - 74% of the time/requires cueing 25 - 49% of the time  Memory Memory assist level: Recognizes or recalls 75 - 89% of the time/requires cueing 10 - 24% of the time    Medical Problem List and Plan: 1. Altered mental status with left sided weakness secondary to right MCA territory infarct as well as history of CVA September 2013 with residual left-sided weakness -Cont CIR  2. DVT Prophylaxis/Anticoagulation: SCDs. Monitor for any signs of DVT 3. Pain Management: Tylenol as needed 4. Mood: Buspar 5 mg twice a day 5. Neuropsych: This patient is capable of making decisions on his own behalf. 6. Skin/Wound Care: Routine skin checks 7.  Fluids/Electrolytes/Nutrition: Routine I&O   Diet advanced to a heart healthy: Modified /23. Pain 8. Upper GI bleed. Follow-up per gastroenterology. Continue Protonix  Hemoglobin 8.4 on 6/23 (stable). 9. Ischemic cardiac heart disease. No chest pain or shortness of breath. Aspirin 81 mg daily. No Plavix at this time due to GI bleed 10. Hypertension. Corgard 40 mg daily. Follow with increased mobility 11. Hyponatremia. 132 on 6/23 (stable) 12. Diabetes mellitus. Hemoglobin A1c 5.6.   Lantus insulin 5 units daily, increased to 7U on 6/27.   Check blood sugars before meals and at bedtime.   Will continue to monitor and consider further adjustments with increased physical activity.   CBG (last 3)   Recent Labs  03/18/16 1655 03/18/16 2107 03/19/16 0659  GLUCAP 211* 136* 130*  13. Pancreatitis. Continue Creon 24,000 units 3 times a day 14. Alcohol tobacco abuse. Nicoderm patch. Provide counseling 15. Klebsiella UTI. Completed course of Ceftin. 16.  Sleep Disturbance- trazodone increased to 100mg  on 6/27, avoid benzos with hx of ETOH abuse  LOS (Days) 7 A FACE TO FACE EVALUATION WAS PERFORMED  Ankit Karis Jubanil Patel 03/19/2016 9:32 AM

## 2016-03-19 NOTE — Progress Notes (Signed)
Occupational Therapy Session Note  Patient Details  Name: Brandon [name] MRN: [id number] Date of Birth: 1966/05/10  Today's Date: 03/19/2016 OT Individual Time: 1305-1350 OT Individual Time Calculation (min): 45 min   Short Term Goals: Week 1:  OT Short Term Goal 1 (Week 1): Pt will be mod I with bed mobility as a precursor for ADLs  OT Short Term Goal 2 (Week 1): Pt will be educated on a functional strengthening HEP for LUE OT Short Term Goal 3 (Week 1): Pt will be min assist for toilet transfers OT Short Term Goal 4 (Week 1): Pt will be min assist for UB/LB dressing OT Short Term Goal 5 (Week 1): Pt will be educated on shower equipment for safety at home  Skilled Therapeutic Interventions/Progress Updates:  Pt found seated in w/c with quick release belt and alarm belt donned. Pt with request to use BR. Pt stood with RW and ambulated into BR with min/mod assist, mod cueing for technique, sequencing, and safety. Pt with successful urine and bowel output, notified nursing staff. Pt stood for peri cleansing and for all clothing management. Pt then ambulated back to w/c, unable to make it all the way to sink due to increased fatigue in LLE. Pt sat at sink in w/c for grooming tasks of washing hands, washing face, brushing teeth, and combing hair. From here, pt worked on w/c propulsion from room to therapy gym - requiring max assist due to decreased coordination in LUE. Pt sat in w/c in gym for therapeutic activity focusing on pincer grasp and release putting graded pins on pole using LUE. Pt required max cues to just use pincer grasp, and not using 3 point pinch. Towards end of activity, therapist taped patient's #3-5 fingers down to encourage pincer grasp. Therapist assisted pt back to room and left pt seated in w/c with quick release donned, alarm belt donned, and all needs within reach.   Therapy Documentation Precautions:  Precautions Precautions: Fall Precaution Comments:  (L  hemiplegia) Restrictions Weight Bearing Restrictions: No  Vital Signs: Therapy Vitals Temp: 97.5 F (36.4 C) Temp Source: Oral Pulse Rate: (!) 111 Resp: 16 BP: 132/75 mmHg Patient Position (if appropriate): Lying Oxygen Therapy SpO2: (!) 84 % O2 Device: Not Delivered  See Function Navigator for Current Functional Status.  Therapy/Group: Individual Therapy  Edwin CapPatricia Ariele Vidrio , MS, OTR/L, CLT  03/19/2016, 1:56 PM

## 2016-03-19 NOTE — Consult Note (Signed)
NEUROCOGNITIVE TESTING - [facility]   MEDICAL NECESSITY:  Brandon [name] was seen on the [facility] for neurocognitive testing owing to the patient's diagnosis of CVA.   Records indicate that Brandon [name] is a "[age] y.o. 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 Patient lives with sister. Reportedly independent prior to admission. Sister does work during the day. Plans to stay with his girlfriend in [place] who also works part time. Presented 03/05/2016 with altered mental status and easily fatigued. He was found by a friend lying on the couch confused covered in feces and urine. MRI of the brain showed acute infarcts scattered in the right middle cerebral artery territory. CT angiogram of head and neck showed chronic occlusion right ICA. Left carotid stent was patent. Occlusion proximal right vertebral artery. Echocardiogram with ejection fraction of 55-60% no wall motion abnormalities. Patient did not receive TPA. Ultrasound the abdomen showed no focal lesions or ascites. 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 Findings of hemoglobin 4.8 and transfused. Underwent upper GI endoscopy 03/08/2016 per Dr. 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 Low-dose aspirin 81 mg daily resumed 03/12/2016. No plans to resume Plavix at this time. Full liquid diet advanced to mechanical soft. Urine culture greater than 100,000 gram-negative rods maintained on Rocephin changed to Ceftin 500 mg twice daily 5 days 03/12/2016. Hyponatremia 125-131 and monitored. Physical therapy evaluation completed 03/09/2016 with recommendations of physical medicine  rehabilitation consult. Patient was admitted for a comprehensive rehabilitation program."   During today's visit, Brandon [name] denied experiencing any cognitive deficits secondary to CVA. He has suffered one prior stroke that he feels he fully recovered from. From an emotional standpoint, he described his current mood as "pretty good." He is mildly stressed about what it is going to take to set up his home with handicap equipment. He is also anxious about his application for disability benefits. He mentioned being diagnosed and treated for anxiety in the past via a benzodiazepine. He is currently taking BuSpar that he finds to be helpful and no longer takes diazepam. In addition, he participated in counseling in the past but does not feel it is required at present. No major adjustment issues endorsed. Suicidal/homicidal ideation, plan or intent was denied. No manic or hypomanic episodes were reported. The patient denied ever experiencing any auditory/visual hallucinations. No major behavioral or personality changes were endorsed.   Patient feels that progress is being made in therapy. No barriers to therapy identified. He described the rehabilitation staff as "good." He presently lives with his girlfriend and she will be able to assist with the transfer home. His sister is also available.  The patient was referred for neuropsychological consultation given the possibility of cognitive sequelae subsequent to the current medical status and in order to assist in treatment planning.   PROCEDURES: [2 units of 96118]  Diagnostic Interview Medical record review Behavioral observations  [place]'s Cognitive Examination- ACE-III  Cognitive Evaluation: Test results revealed intact orientation to person, place, time and situation. Immediate auditory attention was intact but working memory was impaired. Processing speed was consistently reduced and adversely impacted performance on letter and category fluency  measures. Immediate learning was reduced and delayed free recall was markedly impaired with mild benefit noted  via recognition cues. Confrontation naming and expressive language abilities were within normal limits. Visuoperceptual functions were intact.   IMPRESSION: Overall, Brandon [name] denied experiencing any significant cognitive deficits. However, brief evaluation suggests the presence of at least a mild to moderate cognitive problems predominately related to learning and memory and processing speed, as well as certain executive functions. As such, undergoing comprehensive neuropsychological evaluation upon discharge appears warranted. I do not feel these deficits will interfere with his therapies during this admission. There are no major indications of clinical psychopathology to address. As such, neuropsychology will only follow-up during this admission as requested by the patient or rehabilitation staff.   RECOMMENDATIONS  Recommendations for treatment team:  . When interacting with Brandon [name], directions and information should be provided in a simple, straight forward manner, and the treatment team should avoid giving multiple instructions simultaneously.  Marland Kitchen. He may also benefit from being provided with multiple trials to learn new skills given the noted memory inefficiencies. In addition, he will benefit from recognition cueing and an increase in structure and context (i.e., directions given in conversational format as opposed to a random list of instructions).   . To the extent possible, multitasking should be avoided. Marland Kitchen. He requires more time than typical to process information. The treatment team may benefit from waiting for a verbal response to information before presenting additional information.   Recommendations for discharge planning:  . Complete a comprehensive neuropsychological evaluation as an outpatient in 2-3 months. This can be done through Orie FishermanAdam Emile Ringgenberg, PsyD by calling the  following number: [phone].  . Establish long-term follow-up care with a provider knowledgeable in CVA.  Marland Kitchen. Neuroimaging can show time-dependent vulnerability to damage in specific regions, and lesions often evolve ever weeks or months. As such, repeat head CT or brain MRI in 2-3 months might be beneficial.   . Maintain engagement in mentally, physically and cognitively stimulating activities.  . Strive to maintain a healthy lifestyle (e.g., proper diet and exercise) in order to promote physical, cognitive and emotional health.  . His cognitive and motor symptoms appear to place him at a significant vocational disadvantage.  . The patient should refrain from driving at this time.       Debbe MountsAdam T. Kanita Delage, Psy.D., ABN  Board-Certified Clinical Neuropsychologist  Rehabilitation Psychologist

## 2016-03-19 NOTE — Progress Notes (Signed)
Speech Language Pathology Daily Session Note  Patient Details  Name: Brandon [name] MRN: [id number] Date of Birth: 21-Jun-1966  Today's Date: 03/19/2016 SLP Individual Time: [id number] SLP Individual Time Calculation (min): 45 min  Short Term Goals: Week 1: SLP Short Term Goal 1 (Week 1): Patient will verbally identify 2 physical and 1 cognitive deficits post CVA with Mod assist question cues SLP Short Term Goal 2 (Week 1): Patient will request help as needed during basic and familiar problem solving takss with Max assistmultimodal cues  SLP Short Term Goal 3 (Week 1): Patient will attend to left upper extremity and utilize during familiar tasks with Mod assist verbal cues SLP Short Term Goal 4 (Week 1): Patient will alternate attention for 5 minutes with Min assist verbal cues for redirection  SLP Short Term Goal 5 (Week 1): Patient will utilize extenral aids to assist with recall of new information with Min assist verbal cues   Skilled Therapeutic Interventions:  Pt was seen for skilled ST targeting cognitive goals.  Pt required mod assist verbal cues to recognize and correct errors when reviewing medication management task from yesterday's therapy session.  SLP discussed recommendation that pt have assistance for medication and financial management at discharge.  Will continue to reinforce recommendations as pt verbalized decreased awareness as to why he should have help and that he felt he could manage his medications on his own.   SLP also facilitated the session with a novel card game to address goals for problem solving.  Following initial instruction, pt was able to plan and execute a problem solving strategy during the abovementioned task with supervision verbal cues.  Pt was returned to room and left in wheelchair with quick release belt donned and chair alarm set, call bell within reach.  Continue per current plan of care.       Function:  Eating Eating                  Cognition Comprehension Comprehension assist level: Follows basic conversation/direction with no assist  Expression   Expression assist level: Expresses basic needs/ideas: With no assist  Social Interaction Social Interaction assist level: Interacts appropriately 90% of the time - Needs monitoring or encouragement for participation or interaction.  Problem Solving Problem solving assist level: Solves basic 50 - 74% of the time/requires cueing 25 - 49% of the time  Memory Memory assist level: Recognizes or recalls 75 - 89% of the time/requires cueing 10 - 24% of the time    Pain Pain Assessment Pain Assessment: No/denies pain  Therapy/Group: Individual Therapy  Sephira Zellman, Melanee SpryNicole L 03/19/2016, 12:30 PM

## 2016-03-20 ENCOUNTER — Inpatient Hospital Stay (HOSPITAL_COMMUNITY): Payer: Medicaid Other | Admitting: Speech Pathology

## 2016-03-20 ENCOUNTER — Inpatient Hospital Stay (HOSPITAL_COMMUNITY): Payer: Medicaid Other | Admitting: Physical Therapy

## 2016-03-20 ENCOUNTER — Inpatient Hospital Stay (HOSPITAL_COMMUNITY): Payer: Medicaid Other | Admitting: Occupational Therapy

## 2016-03-20 DIAGNOSIS — D62 Acute posthemorrhagic anemia: Secondary | ICD-10-CM

## 2016-03-20 LAB — BASIC METABOLIC PANEL
Anion gap: 9 (ref 5–15)
BUN: 11 mg/dL (ref 6–20)
CALCIUM: 9.1 mg/dL (ref 8.9–10.3)
CO2: 24 mmol/L (ref 22–32)
CREATININE: 0.69 mg/dL (ref 0.61–1.24)
Chloride: 103 mmol/L (ref 101–111)
GFR calc Af Amer: >60 mL/min (ref >60)
Glucose, Bld: 149 mg/dL — ABNORMAL HIGH (ref 65–99)
POTASSIUM: 4.1 mmol/L (ref 3.5–5.1)
SODIUM: 136 mmol/L (ref 135–145)

## 2016-03-20 LAB — GLUCOSE, CAPILLARY
GLUCOSE-CAPILLARY: 139 mg/dL — AB (ref 65–99)
GLUCOSE-CAPILLARY: 204 mg/dL — AB (ref 65–99)
Glucose-Capillary: 122 mg/dL — ABNORMAL HIGH (ref 65–99)
Glucose-Capillary: 222 mg/dL — ABNORMAL HIGH (ref 65–99)

## 2016-03-20 LAB — CBC WITH DIFFERENTIAL/PLATELET
Basophils Absolute: 0.1 10*3/uL (ref 0.0–0.1)
Basophils Relative: 1 %
EOS ABS: 0.1 10*3/uL (ref 0.0–0.7)
EOS PCT: 1 %
HCT: 28.3 % — ABNORMAL LOW (ref 39.0–52.0)
Hemoglobin: 9 g/dL — ABNORMAL LOW (ref 13.0–17.0)
LYMPHS ABS: 1.9 10*3/uL (ref 0.7–4.0)
Lymphocytes Relative: 27 %
MCH: 28.8 pg (ref 26.0–34.0)
MCHC: 31.8 g/dL (ref 30.0–36.0)
MCV: 90.7 fL (ref 78.0–100.0)
Monocytes Absolute: 0.5 10*3/uL (ref 0.1–1.0)
Monocytes Relative: 7 %
Neutro Abs: 4.6 10*3/uL (ref 1.7–7.7)
Neutrophils Relative %: 64 %
PLATELETS: 640 10*3/uL — AB (ref 150–400)
RBC: 3.12 MIL/uL — AB (ref 4.22–5.81)
RDW: 17.4 % — AB (ref 11.5–15.5)
WBC: 7.3 10*3/uL (ref 4.0–10.5)

## 2016-03-20 NOTE — Progress Notes (Signed)
Orthopedic Tech Progress Note Patient Details:  Brandon [name] April 27, 1966 [id number]  Patient ID: Brandon [name], male   DOB: April 27, 1966, 50 y.o.   MRN: [id number] Called in advanced brace order; spoke with Loreli SlotAntoinette  Brandon [name] 03/20/2016, 9:59 AM

## 2016-03-20 NOTE — Progress Notes (Signed)
Speech Language Pathology Weekly Progress and Session Note  Patient Details  Name: Brandon [name] MRN: [id number] Date of Birth: 05-01-1966  Beginning of progress report period: March 12, 2016   End of progress report period: March 20, 2016   Today's Date: 03/20/2016 SLP Individual Time: 3818-2993 SLP Individual Time Calculation (min): 45 min  Short Term Goals: Week 1: SLP Short Term Goal 1 (Week 1): Patient will verbally identify 2 physical and 1 cognitive deficits post CVA with Mod assist question cues SLP Short Term Goal 1 - Progress (Week 1): Met SLP Short Term Goal 2 (Week 1): Patient will request help as needed during basic and familiar problem solving tasks with Max assist multimodal cues  SLP Short Term Goal 2 - Progress (Week 1): Met SLP Short Term Goal 3 (Week 1): Patient will attend to left upper extremity and utilize during familiar tasks with Mod assist verbal cues SLP Short Term Goal 3 - Progress (Week 1): Met SLP Short Term Goal 4 (Week 1): Patient will alternate attention for 5 minutes with Min assist verbal cues for redirection  SLP Short Term Goal 4 - Progress (Week 1): Other (comment) (not addressed this reporting period ) SLP Short Term Goal 5 (Week 1): Patient will utilize external aids to assist with recall of new information with Min assist verbal cues  SLP Short Term Goal 5 - Progress (Week 1): Met    New Short Term Goals: Week 2: SLP Short Term Goal 1 (Week 2): Pt will recognize and correct errors in the moment during basic tasks with min assist verbal cues  SLP Short Term Goal 2 (Week 2): Pt will complete semi-complex tasks with min assist verbal cues for functional problem solving.  SLP Short Term Goal 3 (Week 2): Patient will attend to left upper extremity and left of the environment during familiar tasks with supervision verbal cues SLP Short Term Goal 4 (Week 2): Patient will alternate attention for 5 minutes with Min assist verbal cues for redirection  SLP  Short Term Goal 5 (Week 2): Patient will utilize external aids to assist with recall of new information with supervision verbal cues   Weekly Progress Updates:  Pt made functional gains this reporting period and has met 4 out of 4 targeted goals.  Pt has demonstrated improved intellectual awareness of deficits and some beginning emergent awareness to be able to correct mistakes in the moment with mod assist multimodal cues.  Pt has also demonstrated improved use of external aids to facilitate recall of daily information and consistently uses his therapy schedule with min assist question cues.  Overall, pt requires overall min-mod assist for basic to semi-complex cognitive tasks due to moderate cognitive impairment; as a result, pt would continue to benefit from skilled ST while inpatient in order to maximize functional independence and reduce burden of care prior to discharge.  Pt and family education is ongoing.  Anticipate that pt will need 24/7 supervision in addition to [place] follow up at next level of care.     Intensity: Minumum of 1-2 x/day, 30 to 90 minutes Frequency: 3 to 5 out of 7 days Duration/Length of Stay: 18-22 days Treatment/Interventions: Cognitive remediation/compensation;Cueing hierarchy;Medication managment;Functional tasks;Internal/external aids;Environmental controls;Patient/family education;Therapeutic Activities   Daily Session  Skilled Therapeutic Interventions: Pt was seen for skilled ST targeting cognitive goals.  SLP facilitated the session with a structured table top task targeting recognition and correction of errors. Pt was able to identify errors in ~80% of opportunities with mod assist  verbal cues for attention to detail.  Once errors were identified, pt could verbalize solutions to problems with supervision question cues.  Pt was returned to room at the end of today's therapy session and left in wheelchair with quick release belt donned and chair alarm set.  Goals  updated on this date to reflect current progress and plan of care.     Function:   Eating Eating   Modified Consistency Diet: No Eating Assist Level: More than reasonable amount of time           Cognition Comprehension Comprehension assist level: Follows basic conversation/direction with extra time/assistive device  Expression   Expression assist level: Expresses basic 90% of the time/requires cueing < 10% of the time.  Social Interaction Social Interaction assist level: Interacts appropriately 75 - 89% of the time - Needs redirection for appropriate language or to initiate interaction.  Problem Solving Problem solving assist level: Solves basic 75 - 89% of the time/requires cueing 10 - 24% of the time  Memory Memory assist level: Recognizes or recalls 75 - 89% of the time/requires cueing 10 - 24% of the time   General    Pain Pain Assessment Pain Assessment: No/denies pain  Therapy/Group: Individual Therapy  Torian Quintero, Selinda Orion 03/20/2016, 3:58 PM

## 2016-03-20 NOTE — Progress Notes (Signed)
Physical Therapy Weekly Progress Note  Patient Details  Name: Brandon [name] MRN: [id number] Date of Birth: December 09, 1965  Beginning of progress report period: March 13, 2016 End of progress report period: March 20, 2016  Today's Date: 03/20/2016 PT Individual Time: 0347-4259 PT Individual Time Calculation (min): 72 min   Patient has met 3 of 3 short term goals.  Pt with improving ability to sequence transfers, improving strength & functional use of L UE/LE, improved standing balance. Pt continues to demonstrate cognitive deficits & at this point therapists are unable to determine baseline cognition versus new deficits due to family members not being present during sessions. Pt easily distracted during sessions, requiring continued education & cuing for safety with tasks.  Patient continues to demonstrate the following deficits: L neglect, decreased coordination & use of L UE/LE, impaired standing posture, impaired gait limited by decreased foot clearance, decreased L UE/LE strength and therefore will continue to benefit from skilled PT intervention to enhance overall performance with activity tolerance, balance, postural control, ability to compensate for deficits, functional use of  left upper extremity and left lower extremity, attention, awareness and coordination, as well as for family training.  Patient progressing toward long term goals..  Continue plan of care.  PT Short Term Goals Week 1:  PT Short Term Goal 1 (Week 1): Pt will complete stand/squat pivot transfers & sit<>stand transfers with Min A.   PT Short Term Goal 1 - Progress (Week 1): Met PT Short Term Goal 2 (Week 1): Pt will ambulate with LRAD x 50 ft with Min A. PT Short Term Goal 2 - Progress (Week 1): Met PT Short Term Goal 3 (Week 1): Pt will complete bed mobility with Steady A. PT Short Term Goal 3 - Progress (Week 1): Met Week 2:  PT Short Term Goal 1 (Week 2): STG = LTG due to estimated d/c date.  Skilled Therapeutic  Interventions/Progress Updates:    Pt received in w/c & agreeable to PT, noting 7/10 back pain but did not demonstrate behavior consistent with this. Pt reported need to use restroom & performed sit<>stand transfers with steady A/supervision. Gait training x 10 ft to bathroom with RW, L AFO, & min A. Pt with significantly flexed posture and did not ambulate within base of RW; pt unable to correct these even with max multimodal cuing. Pt completed toilet transfers with steady A & use of grab bars, (+) BM & void & pt able to perform peri-hygiene with steady A for standing balance. Gait training x 15 ft to sink & performed hand hygiene in standing with steady A. Gait training an additional 25 ft + 25 ft with maximum assistance for facilitation of upright posture, as well as verbal cuing with pt unable to maintain without significant correction. Pt c/o R hip pain with ambulation and demonstrates continued slight foot drag LLE. In gym pt transferred onto mat table but began walking away from it then planning to back up to it. Educated pt to walk forwards towards destination then turn and sit once closer to table. While sitting on edge of mat & using mirror for visual feedback PT provided maximum tactile cuing for scapular retraction to open chest & facilitating anterior pelvic tilt to improve posture while reaching for horseshoes with LUE for neuro re-ed. 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 Gait training x 90 ft with pt  self selecting posture as he is unable to maintain full upright positioning, with supervision & RW. Pt required intermittent cuing for L foot clearance & increased step length. At end of session pt left in room in w/c with QRB & alarm belt in place & all needs within reach.  Therapy Documentation Precautions:  Precautions Precautions: Fall Precaution Comments:  (L  hemiplegia) Restrictions Weight Bearing Restrictions: No  Pain: Pain Assessment Pain Assessment: 0-10 Pain Score: 7  Pain Location: Back Pain Intervention(s): RN made aware   See Function Navigator for Current Functional Status.  Therapy/Group: Individual Therapy  Waunita Schooner 03/20/2016, 7:56 AM

## 2016-03-20 NOTE — Progress Notes (Signed)
Occupational Therapy Session Note & Weekly Progress Note  Patient Details  Name: Brandon [name] MRN: [id number] Date of Birth: Feb 28, 1966  Today's Date: 03/20/2016 OT Individual Time: 2952-8413 OT Individual Time Calculation (min): 75 min   SESSION NOTE Pt found supine in bed with no complaints of pain. Pt engaged in bed mobility at mod I level and sat EOB for nurse to administer medications.While seated EOB, assisted pt with donning of shoes and AFO. Pt refused shower, therfore pt performed ADL at sink level per his request in sit to/from stand position. Pt also performed grooming tasks seated in w/c at sink. Pt required up to mod verbal cueing for initiation, attention, awareness, and problem solving. Therapist propelled pt to tub/shower room and educated pt on tub transfer bench discussing how he will need to use one at home and plan is to practice transfer at a later date. At end of session, left pt seated in w/c with all needs within reach and quick release belt donned along with belt alarm donned.   Therapy/Group: Individual Therapy  --------------------------------------------------------------------------------------------------------------------------------  WEEKLY PROGRESS NOTE  Beginning of progress report period: March 13, 2016 End of progress report period: March 20, 2016  Patient has met 5 of 5 short term goals.  Pt making steady progress towards OT goals, will continue plan of care for now. Plan is for patient to discharge 03/29/16. Pt reports that his sister will not be able to provide care post CIR discharge, but states his girlfriend and his girlfriends family will be able to provide supervision/assistance prn. Pt with decreased attention, awareness, and problem solving, unsure of patient's cognitive baseline as no family has been present during therapy session. Discussed with patient importance of family being present for family education prior to his discharge home.  Patient  continues to demonstrate the following deficits: decreased ADL independence, decreased dynamic standing balance/tolerance/endurance, decreased functional use of LUE, decreased cognition (however unsure of patient's baseline), and decreased overall activity tolerance/endurance. Therefore, patient will continue to benefit from skilled OT intervention to enhance overall performance with BADL, iADL and Reduce care partner burden.  Patient progressing toward long term goals..  Continue plan of care.  OT Short Term Goals Week 1:  OT Short Term Goal 1 (Week 1): Pt will be mod I with bed mobility as a precursor for ADLs  OT Short Term Goal 1 - Progress (Week 1): Met OT Short Term Goal 2 (Week 1): Pt will be educated on a functional strengthening HEP for LUE OT Short Term Goal 2 - Progress (Week 1): Met OT Short Term Goal 3 (Week 1): Pt will be min assist for toilet transfers OT Short Term Goal 3 - Progress (Week 1): Met OT Short Term Goal 4 (Week 1): Pt will be min assist for UB/LB dressing OT Short Term Goal 4 - Progress (Week 1): Met OT Short Term Goal 5 (Week 1): Pt will be educated on shower equipment for safety at home OT Short Term Goal 5 - Progress (Week 1): Met   Week 2:  OT Short Term Goal 1 (Week 2): STGs = LTGs  Skilled Therapeutic Interventions/Progress Updates:  Balance/vestibular training;Community reintegration;Discharge planning;Disease mangement/prevention;DME/adaptive equipment instruction;Functional mobility training;Neuromuscular re-education;Pain management;Patient/family education;Psychosocial support;Self Care/advanced ADL retraining;Skin care/wound managment;Splinting/orthotics;Therapeutic Activities;Therapeutic Exercise;UE/LE Strength taining/ROM;UE/LE Coordination activities;Wheelchair propulsion/positioning   Therapy Documentation Precautions:  Precautions Precautions: Fall Precaution Comments:  (L hemiplegia) Restrictions Weight Bearing Restrictions: No  See Function  Navigator for Current Functional Status.  Chrys Racer , MS, OTR/L, CLT  03/20/2016,  9:37 AM

## 2016-03-20 NOTE — Progress Notes (Signed)
[place] PHYSICAL MEDICINE & REHABILITATION     PROGRESS NOTE  Subjective/Complaints:  Pt sitting up at the edge of the bed, eating breakfast.  He slept better last night with the increase in Trazodone.   ROS: Denies CP, SOB, nausea, vomiting, diarrhea.  Objective: Vital Signs: Blood pressure 112/56, pulse 63, temperature 98 F (36.7 C), temperature source Axillary, resp. rate 18, height 6\' 1"  (1.854 m), weight 84.959 kg (187 lb 4.8 oz), SpO2 99 %. No results found.  Recent Labs  03/20/16 0500  WBC 7.3  HGB 9.0*  HCT 28.3*  PLT 640*    Recent Labs  03/20/16 0500  NA 136  K 4.1  CL 103  GLUCOSE 149*  BUN 11  CREATININE 0.69  CALCIUM 9.1   CBG (last 3)   Recent Labs  03/19/16 1725 03/19/16 2106 03/20/16 0645  GLUCAP 139* 139* 139*    Wt Readings from Last 3 Encounters:  03/20/16 84.959 kg (187 lb 4.8 oz)  03/11/16 85.1 kg (187 lb 9.8 oz)  04/12/15 92.987 kg (205 lb)    Physical Exam:  BP 112/56 mmHg  Pulse 63  Temp(Src) 98 F (36.7 C) (Axillary)  Resp 18  Ht 6\' 1"  (1.854 m)  Wt 84.959 kg (187 lb 4.8 oz)  BMI 24.72 kg/m2  SpO2 99% Constitutional: He appears well-developed and well-nourished.  HENT: Normocephalic and atraumatic.  Eyes: EOM are normal. Right eye exhibits no discharge. Left eye exhibits no discharge.  Cardiovascular: Normal rate and regular rhythm.  Respiratory: Effort normal and breath sounds normal. No respiratory distress.  GI: Soft. Bowel sounds are normal. He exhibits no distension.  Musculoskeletal: He exhibits no edema or tenderness.  Neurological: He is alert and oriented 3.  Mood is flat but appropriate.  Motor: Right upper extremity/right lower extremity: 5/5 proximal to distal Left upper extremity: 4/5 delt, bicep, wrist, tricep, HI with motor apraxia Left lower extremity: 4/5 HF, KE, 4+/5 ADF/PF with apraxia LUE tremor Skin: Skin is warm and dry.  Psychiatric: He has a normal mood and affect. His behavior is  normal  Assessment/Plan: 1. Functional deficits secondary to right MCA territory infarct as well as history of CVA with residual weakness which require 3+ hours per day of interdisciplinary therapy in a comprehensive inpatient rehab setting. Physiatrist is providing close team supervision and 24 hour management of active medical problems listed below. Physiatrist and rehab team continue to assess barriers to discharge/monitor patient progress toward functional and medical goals.  Function:  Bathing Bathing position Bathing activity did not occur: Refused Position: Wheelchair/chair at sink  Bathing parts Body parts bathed by patient: Right arm, Left arm, Chest, Abdomen, Front perineal area, Buttocks, Right upper leg, Left upper leg Body parts bathed by helper: Buttocks, Back, Left lower leg, Right lower leg, Left upper leg, Right upper leg  Bathing assist Assist Level: Touching or steadying assistance(Pt > 75%)      Upper Body Dressing/Undressing Upper body dressing   What is the patient wearing?: Pull over shirt/dress     Pull over shirt/dress - Perfomed by patient: Thread/unthread right sleeve, Put head through opening Pull over shirt/dress - Perfomed by helper: Thread/unthread left sleeve, Pull shirt over trunk        Upper body assist Assist Level:  (mod A)      Lower Body Dressing/Undressing Lower body dressing   What is the patient wearing?: Pants Underwear - Performed by patient: Thread/unthread right underwear leg, Pull underwear up/down Underwear - Performed by helper:  Thread/unthread left underwear leg Pants- Performed by patient: Thread/unthread right pants leg, Pull pants up/down Pants- Performed by helper: Thread/unthread left pants leg Non-skid slipper socks- Performed by patient: Don/doff right sock Non-skid slipper socks- Performed by helper: Don/doff left sock Socks - Performed by patient: Don/doff right sock, Don/doff left sock   Shoes - Performed by patient:  Don/doff right shoe, Fasten right, Fasten left Shoes - Performed by helper: Don/doff left shoe          Lower body assist Assist for lower body dressing:  (mod A)      Toileting Toileting Toileting activity did not occur: N/A   Toileting steps completed by helper: Adjust clothing prior to toileting, Performs perineal hygiene, Adjust clothing after toileting    Toileting assist Assist level: Two helpers (per Brunswick CorporationJobelle Mesias, NT)   Transfers Chair/bed transfer   Chair/bed transfer method: Ambulatory Chair/bed transfer assist level: Touching or steadying assistance (Pt > 75%) Chair/bed transfer assistive device: Biochemist, clinicalWalker Mechanical lift: Landscape architecttedy   Locomotion Ambulation     Max distance: 60 ft Assist level: Touching or steadying assistance (Pt > 75%)   Wheelchair   Type: Manual Max wheelchair distance: 25 ft Assist Level: Moderate assistance (Pt 50 - 74%)  Cognition Comprehension Comprehension assist level: Follows complex conversation/direction with no assist  Expression Expression assist level: Expresses complex ideas: With no assist  Social Interaction Social Interaction assist level: Interacts appropriately 50 - 74% of the time - May be physically or verbally inappropriate.  Problem Solving Problem solving assist level: Solves basic 50 - 74% of the time/requires cueing 25 - 49% of the time  Memory Memory assist level: Recognizes or recalls 75 - 89% of the time/requires cueing 10 - 24% of the time    Medical Problem List and Plan: 1. Altered mental status with left sided weakness secondary to right MCA territory infarct as well as history of CVA September 2013 with residual left-sided weakness -Cont CIR  2. DVT Prophylaxis/Anticoagulation: SCDs. Monitor for any signs of DVT 3. Pain Management: Tylenol as needed 4. Mood: Buspar 5 mg twice a day 5. Neuropsych: This patient is capable of making decisions on his own behalf. 6. Skin/Wound Care: Routine skin  checks 7. Fluids/Electrolytes/Nutrition: Routine I&O   Diet advanced to a heart healthy: Modified /23. Pain 8. Upper GI bleed. Follow-up per gastroenterology. Continue Protonix  Hemoglobin 9.0 on 6/28 (stable). 9. Ischemic cardiac heart disease. No chest pain or shortness of breath. Aspirin 81 mg daily. No Plavix at this time due to GI bleed 10. Hypertension. Corgard 40 mg daily. Follow with increased mobility 11. Hyponatremia. 136 on 6/28 (improved) 12. Diabetes mellitus. Hemoglobin A1c 5.6.   Lantus insulin 5 units daily, increased to 7U on 6/27.   Check blood sugars before meals and at bedtime.   Will continue to monitor and consider further adjustments with increased physical activity.  Improving at present 13. Pancreatitis. Continue Creon 24,000 units 3 times a day 14. Alcohol tobacco abuse. Nicoderm patch. Provide counseling 15. Klebsiella UTI. Completed course of Ceftin. 16.  Sleep Disturbance- trazodone increased to 100mg  on 6/27, avoid benzos with hx of ETOH abuse  LOS (Days) 8 A FACE TO FACE EVALUATION WAS PERFORMED  Kiele Heavrin Karis Jubanil Taim Wurm 03/20/2016 8:47 AM

## 2016-03-21 ENCOUNTER — Inpatient Hospital Stay (HOSPITAL_COMMUNITY): Payer: Medicaid Other | Admitting: Physical Therapy

## 2016-03-21 ENCOUNTER — Inpatient Hospital Stay (HOSPITAL_COMMUNITY): Payer: Medicaid Other | Admitting: Speech Pathology

## 2016-03-21 ENCOUNTER — Inpatient Hospital Stay (HOSPITAL_COMMUNITY): Payer: Medicaid Other | Admitting: Occupational Therapy

## 2016-03-21 LAB — GLUCOSE, CAPILLARY
GLUCOSE-CAPILLARY: 258 mg/dL — AB (ref 65–99)
Glucose-Capillary: 218 mg/dL — ABNORMAL HIGH (ref 65–99)
Glucose-Capillary: 127 mg/dL — ABNORMAL HIGH (ref 65–99)
Glucose-Capillary: 283 mg/dL — ABNORMAL HIGH (ref 65–99)

## 2016-03-21 NOTE — Progress Notes (Signed)
Speech Language Pathology Daily Session Note  Patient Details  Name: Brandon [name] MRN: [id number] Date of Birth: 1965-10-31  Today's Date: 03/21/2016 SLP Individual Time: 1020-1103 SLP Individual Time Calculation (min): 43 min  Short Term Goals: Week 2: SLP Short Term Goal 1 (Week 2): Pt will recognize and correct errors in the moment during basic tasks with min assist verbal cues  SLP Short Term Goal 2 (Week 2): Pt will complete semi-complex tasks with min assist verbal cues for functional problem solving.  SLP Short Term Goal 3 (Week 2): Patient will attend to left upper extremity and left of the environment during familiar tasks with supervision verbal cues SLP Short Term Goal 4 (Week 2): Patient will alternate attention for 5 minutes with Min assist verbal cues for redirection  SLP Short Term Goal 5 (Week 2): Patient will utilize external aids to assist with recall of new information with supervision verbal cues   Skilled Therapeutic Interventions:  Pt was seen for skilled ST targeting cognitive goals.  Per report from pt's primary OT prior to beginning of treatment session, OT had discussed pt doing laundry at some point today.  Pt initiated request to do laundry upon therapist's arrival with supervision question cues.  Pt also benefited from supervision question cues to recall and utilize safe ambulation strategies and walker use (i.e. Leading with left leg, keeping feet in between walker) when approaching washing machine and loading clothes into washer.  Pt with bladder urgency during task and then as a result required mod-max assist multimodal cues for sequencing safe walker use to ambulate back to wheelchair.  Therapist assisted pt in hygiene and donning clean brief.  Pt was left in wheelchair with chair alarm set, quick release belt donned and call bell left within reach.  Continue per current plan of care.    Function:  Eating Eating                Cognition Comprehension  Comprehension assist level: Follows basic conversation/direction with extra time/assistive device  Expression   Expression assist level: Expresses basic needs/ideas: With no assist  Social Interaction Social Interaction assist level: Interacts appropriately 75 - 89% of the time - Needs redirection for appropriate language or to initiate interaction.  Problem Solving Problem solving assist level: Solves basic 50 - 74% of the time/requires cueing 25 - 49% of the time  Memory Memory assist level: Recognizes or recalls 75 - 89% of the time/requires cueing 10 - 24% of the time    Pain Pain Assessment Pain Assessment: No/denies pain  Therapy/Group: Individual Therapy  Arvo Ealy, Melanee SpryNicole L 03/21/2016, 12:25 PM

## 2016-03-21 NOTE — Progress Notes (Signed)
Social Work Patient ID: Brandon [name], male   DOB: 03-17-1966, 50 y.o.   MRN: [id number] Met with pt and [name]-girlfriend who was here to discuss have made several attempts to contact disability worker-Bradford Myler to discuss pt's disability and paperwork needed to support this. Mervyn Gay paperwork for local office she will take in to them to see if this will assist in pt's disability claim. She is aware of team conference goals and target discharge 7/7. She can see he has made progress Since last week when she was here. 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 Estill Bamberg will go to the local office and see if she can get a contact for this worker to talk with. Continue to work on discharge plans.

## 2016-03-21 NOTE — Progress Notes (Signed)
Physical Therapy Session Note  Patient Details  Name: Brandon [name] MRN: [id number] Date of Birth: [birth year]  Today's Date: 03/21/2016 PT Individual Time: 0930-1000 PT Individual Time Calculation (min): 30 min   Short Term Goals: Week 2:  PT Short Term Goal 1 (Week 2): STG = LTG due to estimated d/c date.  Skilled Therapeutic Interventions/Progress Updates:    Pt presented in chair.  Practiced w/c management and pt able to negotiate out of room and propelled approx 50f with cues for increased use of L side. Performed toe taps with 2" step for promotion of increased L foot clearance with ambulation. Provided tactile cues for increased L knee extension and increased L foot clearance.  Ambulated approx 224fpost taps with some improved carryover however required con't tactile cues for improved clearance. Pt returned to room remained in w/c with all current needs met.    Therapy Documentation Precautions:  Precautions Precautions: Fall Precaution Comments:  (L hemiplegia) Restrictions Weight Bearing Restrictions: No General:   Vital Signs:   Pain: Pain Assessment Pain Assessment: No/denies pain Mobility:    Exercises:   Other Treatments:     See Function Navigator for Current Functional Status.   Therapy/Group: Individual Therapy  Addis Tuohy  Garreth Burnsworth, PTA  03/21/2016, 4:03 PM

## 2016-03-21 NOTE — Progress Notes (Signed)
Physical Therapy Session Note  Patient Details  Name: Brandon [name] MRN: [id number] Date of Birth: [birth year]  Today's Date: 03/21/2016 PT Individual Time: 1300-1400 PT Individual Time Calculation (min): 60 min   Short Term Goals: Week 2:  PT Short Term Goal 1 (Week 2): STG = LTG due to estimated d/c date.  Skilled Therapeutic Interventions/Progress Updates:    Pt received seated in w/c with no c/o pain and agreeable to treatment. Transported to/from gym in w/c totalA for energy conservation. Gait with RW x3 trials 7150' each with RW for orthotic consult. First trial with LLE GRAFO; noted sufficient LLE knee control so second and third trial performed with PLS. No buckling/hyperextension noted with PLS and pt reports subjective improvement in ease of walking, lighter weight. Two trials of gait x50' with quad cane; demonstrated step-to gait pattern with quad cane. Note poor weight shift to R side to allow clearance of LLE. Standing balance with 4" step performing LLE taps up to step to facilitate R weight shift and SLS balance while coordinating LLE. Pt returned to room as above; remained seated in w/c with quick release belt and safety alarm belt intact, girlfriend present.   Therapy Documentation Precautions:  Precautions Precautions: Fall Precaution Comments:  (L hemiplegia) Restrictions Weight Bearing Restrictions: No Pain: Pain Assessment Pain Assessment: No/denies pain   See Function Navigator for Current Functional Status.   Therapy/Group: Individual Therapy  Vista Lawmanlizabeth J Tygielski 03/21/2016, 2:13 PM

## 2016-03-21 NOTE — Progress Notes (Signed)
Occupational Therapy Session Note  Patient Details  Name: Brandon [name] MRN: [id number] Date of Birth: 01-Jan-1966  Today's Date: 03/21/2016 OT Individual Time: 0805-0905 OT Individual Time Calculation (min): 60 min   Short Term Goals: Week 1:  OT Short Term Goal 1 (Week 1): Pt will be mod I with bed mobility as a precursor for ADLs  OT Short Term Goal 1 - Progress (Week 1): Met OT Short Term Goal 2 (Week 1): Pt will be educated on a functional strengthening HEP for LUE OT Short Term Goal 2 - Progress (Week 1): Met OT Short Term Goal 3 (Week 1): Pt will be min assist for toilet transfers OT Short Term Goal 3 - Progress (Week 1): Met OT Short Term Goal 4 (Week 1): Pt will be min assist for UB/LB dressing OT Short Term Goal 4 - Progress (Week 1): Met OT Short Term Goal 5 (Week 1): Pt will be educated on shower equipment for safety at home OT Short Term Goal 5 - Progress (Week 1): Met   Week 2:  OT Short Term Goal 1 (Week 2): STGs = LTGs  Skilled Therapeutic Interventions/Progress Updates:  Skilled intervention focused on functional ambulation/mobility using RW, safety awareness, initiation, attention, ADL retraining, sit to/from stands, dynamic standing balance/tolerance/endurance, and overall activity tolerance/endurance. Patient found seated EOB upon OT entering room with no complaints of pain, pt with bilateral shoes and Lt AFO donned. Nursing staff present to remove two IVs from Lt arm. Pt stood with RW and ambulated into BR, therapist asked pt if he needed to use BR and he stated "probably". Before he could make it to toilet, pt with urine incontinence. Once seated on elevated toilet seat, pt with successful BM. Pt stood for peri cleansing with min guard provided by therapist. Pt then ambulated into shower stall and sat on shower seat. Pt performed UB/LB bathing in seated position, unsafe for pt to stand without shoes or some type of gripper support on feet at this time. During shower, pt  required mod to max cueing for initiation and attention. Therapist assisted with donning bilateral shoes and Lt AFO and pt stood for therapist to assist with washing of buttock, as pt unable to perform lateral leans to reach buttock. From here, pt ambulated to w/c for UB/LB dressing in sit to/from stand position. During functional ambulation using RW, pt required max cueing for correct technique and RW safety awareness and minimal physical assistance. At end of session, left pt seated in w/c with quick release belt donned and alarm belt donned & all needs within reach.   Therapy Documentation Precautions:  Precautions Precautions: Fall Precaution Comments:  (L hemiplegia) Restrictions Weight Bearing Restrictions: No  Vital Signs: Therapy Vitals Temp: 98.1 F (36.7 C) Temp Source: Oral Pulse Rate: 60 Resp: 18 BP: 138/76 mmHg Patient Position (if appropriate): Lying Oxygen Therapy SpO2: 100 % O2 Device: Not Delivered  See Function Navigator for Current Functional Status.  Therapy/Group: Individual Therapy  Chrys Racer , MS, OTR/L, CLT  03/21/2016, 9:10 AM

## 2016-03-21 NOTE — Progress Notes (Signed)
[place] PHYSICAL MEDICINE & REHABILITATION     PROGRESS NOTE  Subjective/Complaints:  Pt sitting at edge of bed, eating breakfast.  He slept "okay" overnight because he states he took his trazodone too early. He received his AFO.  ROS: Denies CP, SOB, nausea, vomiting, diarrhea.  Objective: Vital Signs: Blood pressure 138/76, pulse 60, temperature 98.1 F (36.7 C), temperature source Oral, resp. rate 18, height 6\' 1"  (1.854 m), weight 87.952 kg (193 lb 14.4 oz), SpO2 100 %. No results found.  Recent Labs  03/20/16 0500  WBC 7.3  HGB 9.0*  HCT 28.3*  PLT 640*    Recent Labs  03/20/16 0500  NA 136  K 4.1  CL 103  GLUCOSE 149*  BUN 11  CREATININE 0.69  CALCIUM 9.1   CBG (last 3)   Recent Labs  03/20/16 1627 03/20/16 2104 03/21/16 0650  GLUCAP 204* 222* 218*    Wt Readings from Last 3 Encounters:  03/21/16 87.952 kg (193 lb 14.4 oz)  03/11/16 85.1 kg (187 lb 9.8 oz)  04/12/15 92.987 kg (205 lb)    Physical Exam:  BP 138/76 mmHg  Pulse 60  Temp(Src) 98.1 F (36.7 C) (Oral)  Resp 18  Ht 6\' 1"  (1.854 m)  Wt 87.952 kg (193 lb 14.4 oz)  BMI 25.59 kg/m2  SpO2 100% Constitutional: He appears well-developed and well-nourished.  HENT: Normocephalic and atraumatic.  Eyes: EOM are normal. Right eye exhibits no discharge. Left eye exhibits no discharge.  Cardiovascular: Normal rate and regular rhythm.  Respiratory: Effort normal and breath sounds normal. No respiratory distress.  GI: Soft. Bowel sounds are normal. He exhibits no distension.  Musculoskeletal: He exhibits no edema or tenderness.  Neurological: He is alert and oriented 3.  Mood is flat but appropriate.  Motor: Right upper extremity/right lower extremity: 5/5 proximal to distal Left upper extremity: 4/5 delt, bicep, wrist, tricep, HI with motor apraxia Left lower extremity: 4/5 HF, KE, 4+/5 ADF/PF with apraxia LUE tremor Skin: Skin is warm and dry.  Psychiatric: He has a normal mood  and affect. His behavior is normal  Assessment/Plan: 1. Functional deficits secondary to right MCA territory infarct as well as history of CVA with residual weakness which require 3+ hours per day of interdisciplinary therapy in a comprehensive inpatient rehab setting. Physiatrist is providing close team supervision and 24 hour management of active medical problems listed below. Physiatrist and rehab team continue to assess barriers to discharge/monitor patient progress toward functional and medical goals.  Function:  Bathing Bathing position Bathing activity did not occur: Refused Position: Wheelchair/chair at sink  Bathing parts Body parts bathed by patient: Right arm, Left arm, Chest, Abdomen, Front perineal area, Buttocks, Right upper leg, Left upper leg Body parts bathed by helper: Buttocks, Back, Left lower leg, Right lower leg, Left upper leg, Right upper leg  Bathing assist Assist Level: Touching or steadying assistance(Pt > 75%) (for standing balance)      Upper Body Dressing/Undressing Upper body dressing   What is the patient wearing?: Pull over shirt/dress     Pull over shirt/dress - Perfomed by patient: Thread/unthread right sleeve, Put head through opening, Thread/unthread left sleeve, Pull shirt over trunk Pull over shirt/dress - Perfomed by helper: Thread/unthread left sleeve, Pull shirt over trunk        Upper body assist Assist Level: Set up, Supervision or verbal cues      Lower Body Dressing/Undressing Lower body dressing   What is the patient wearing?: Underwear, Pants,  Shoes, AFO Underwear - Performed by patient: Thread/unthread right underwear leg, Pull underwear up/down, Thread/unthread left underwear leg Underwear - Performed by helper: Thread/unthread left underwear leg Pants- Performed by patient: Thread/unthread right pants leg, Pull pants up/down, Thread/unthread left pants leg Pants- Performed by helper: Thread/unthread left pants leg Non-skid  slipper socks- Performed by patient: Don/doff right sock Non-skid slipper socks- Performed by helper: Don/doff left sock Socks - Performed by patient: Don/doff right sock, Don/doff left sock   Shoes - Performed by patient: Don/doff right shoe, Fasten right, Fasten left Shoes - Performed by helper: Don/doff left shoe   AFO - Performed by helper: Don/doff left AFO      Lower body assist Assist for lower body dressing: Touching or steadying assistance (Pt > 75%) (min assist for standing balance/safety)      Toileting Toileting Toileting activity did not occur: N/A Toileting steps completed by patient: Performs perineal hygiene, Adjust clothing prior to toileting, Adjust clothing after toileting Toileting steps completed by helper: Adjust clothing prior to toileting, Performs perineal hygiene, Adjust clothing after toileting    Toileting assist Assist level: Touching or steadying assistance (Pt.75%)   Transfers Chair/bed transfer   Chair/bed transfer method: Ambulatory Chair/bed transfer assist level: Touching or steadying assistance (Pt > 75%) Chair/bed transfer assistive device: Biochemist, clinical lift: Landscape architect     Max distance: 90 ft Assist level: Supervision or verbal cues   Wheelchair   Type: Manual Max wheelchair distance: 25 ft Assist Level: Moderate assistance (Pt 50 - 74%)  Cognition Comprehension Comprehension assist level: Follows complex conversation/direction with extra time/assistive device  Expression Expression assist level: Expresses basic 90% of the time/requires cueing < 10% of the time.  Social Interaction Social Interaction assist level: Interacts appropriately 75 - 89% of the time - Needs redirection for appropriate language or to initiate interaction.  Problem Solving Problem solving assist level: Solves basic 75 - 89% of the time/requires cueing 10 - 24% of the time  Memory Memory assist level: Recognizes or recalls 75 - 89% of the  time/requires cueing 10 - 24% of the time    Medical Problem List and Plan: 1. Altered mental status with left sided weakness secondary to right MCA territory infarct as well as history of CVA September 2013 with residual left-sided weakness -Cont CIR  2. DVT Prophylaxis/Anticoagulation: SCDs. Monitor for any signs of DVT 3. Pain Management: Tylenol as needed 4. Mood: Buspar 5 mg twice a day 5. Neuropsych: This patient is capable of making decisions on his own behalf. 6. Skin/Wound Care: Routine skin checks 7. Fluids/Electrolytes/Nutrition: Routine I&O   Diet advanced to a heart healthy: Modified /23. Pain 8. Upper GI bleed. Follow-up per gastroenterology. Continue Protonix  Hemoglobin 9.0 on 6/28 (stable). 9. Ischemic cardiac heart disease. No chest pain or shortness of breath. Aspirin 81 mg daily. No Plavix at this time due to GI bleed 10. Hypertension. Corgard 40 mg daily. Follow with increased mobility 11. Hyponatremia. 136 on 6/28 (improved) 12. Diabetes mellitus. Hemoglobin A1c 5.6.   Lantus insulin 5 units daily, increased to 7U on 6/27.   Check blood sugars before meals and at bedtime.   Will continue to monitor and consider further adjustments with increased physical activity.  Unexpectedly increased in the last 24 hours, will cont to monitor and adjust medications if persistently elevated 13. Pancreatitis. Continue Creon 24,000 units 3 times a day 14. Alcohol tobacco abuse. Nicoderm patch. Provide counseling 15. Klebsiella UTI. Completed course of Ceftin. 16.  Sleep Disturbance- trazodone increased to 100mg  on 6/27, avoid benzos with hx of ETOH abuse  Improved  LOS (Days) 9 A FACE TO FACE EVALUATION WAS PERFORMED  Martin Smeal Karis Jubanil Elyas Villamor 03/21/2016 8:50 AM

## 2016-03-22 ENCOUNTER — Inpatient Hospital Stay (HOSPITAL_COMMUNITY): Payer: Medicaid Other | Admitting: Speech Pathology

## 2016-03-22 ENCOUNTER — Inpatient Hospital Stay (HOSPITAL_COMMUNITY): Payer: Medicaid Other | Admitting: Occupational Therapy

## 2016-03-22 ENCOUNTER — Inpatient Hospital Stay (HOSPITAL_COMMUNITY): Payer: Medicaid Other | Admitting: Physical Therapy

## 2016-03-22 LAB — GLUCOSE, CAPILLARY
GLUCOSE-CAPILLARY: 226 mg/dL — AB (ref 65–99)
Glucose-Capillary: 137 mg/dL — ABNORMAL HIGH (ref 65–99)
Glucose-Capillary: 179 mg/dL — ABNORMAL HIGH (ref 65–99)
Glucose-Capillary: 215 mg/dL — ABNORMAL HIGH (ref 65–99)

## 2016-03-22 MED ORDER — METFORMIN HCL 500 MG PO TABS
500.0000 mg | ORAL_TABLET | Freq: Every day | ORAL | Status: DC
  Administered 2016-03-22 – 2016-03-26 (×5): 500 mg via ORAL
  Filled 2016-03-22 (×5): qty 1

## 2016-03-22 NOTE — Progress Notes (Signed)
[place] PHYSICAL MEDICINE & REHABILITATION     PROGRESS NOTE  Subjective/Complaints:  Pt sitting at the sink getting ready.  He states trazodone only works when it is taken with food, but states he can never get food at that time.   ROS: Denies CP, SOB, nausea, vomiting, diarrhea.  Objective: Vital Signs: Blood pressure 139/75, pulse 79, temperature 98.3 F (36.8 C), temperature source Oral, resp. rate 18, height 6\' 1"  (1.854 m), weight 85 kg (187 lb 6.3 oz), SpO2 98 %. No results found.  Recent Labs  03/20/16 0500  WBC 7.3  HGB 9.0*  HCT 28.3*  PLT 640*    Recent Labs  03/20/16 0500  NA 136  K 4.1  CL 103  GLUCOSE 149*  BUN 11  CREATININE 0.69  CALCIUM 9.1   CBG (last 3)   Recent Labs  03/21/16 1648 03/21/16 2046 03/22/16 0634  GLUCAP 283* 258* 137*    Wt Readings from Last 3 Encounters:  03/22/16 85 kg (187 lb 6.3 oz)  03/11/16 85.1 kg (187 lb 9.8 oz)  04/12/15 92.987 kg (205 lb)    Physical Exam:  BP 139/75 mmHg  Pulse 79  Temp(Src) 98.3 F (36.8 C) (Oral)  Resp 18  Ht 6\' 1"  (1.854 m)  Wt 85 kg (187 lb 6.3 oz)  BMI 24.73 kg/m2  SpO2 98% Constitutional: He appears well-developed and well-nourished.  HENT: Normocephalic and atraumatic.  Eyes: EOM are normal. Right eye exhibits no discharge. Left eye exhibits no discharge.  Cardiovascular: Normal rate and regular rhythm.  Respiratory: Effort normal and breath sounds normal. No respiratory distress.  GI: Soft. Bowel sounds are normal. He exhibits no distension.  Musculoskeletal: He exhibits no edema or tenderness.  Neurological: He is alert and oriented 3.  Mood is flat but appropriate.  Motor: Right upper extremity/right lower extremity: 5/5 proximal to distal Left upper extremity: 4/5 delt, bicep, wrist, tricep, HI with motor apraxia Left lower extremity: 4/5 HF, KE, 4+/5 ADF/PF with apraxia LUE tremor Skin: Skin is warm and dry.  Psychiatric: He has a normal mood and affect. His  behavior is normal  Assessment/Plan: 1. Functional deficits secondary to right MCA territory infarct as well as history of CVA with residual weakness which require 3+ hours per day of interdisciplinary therapy in a comprehensive inpatient rehab setting. Physiatrist is providing close team supervision and 24 hour management of active medical problems listed below. Physiatrist and rehab team continue to assess barriers to discharge/monitor patient progress toward functional and medical goals.  Function:  Bathing Bathing position Bathing activity did not occur: Refused Position: Wheelchair/chair at sink  Bathing parts Body parts bathed by patient: Right arm, Left arm, Chest, Abdomen, Front perineal area, Buttocks, Right upper leg, Left upper leg Body parts bathed by helper: Buttocks, Back, Left lower leg, Right lower leg, Left upper leg, Right upper leg  Bathing assist Assist Level: Touching or steadying assistance(Pt > 75%) (for standing balance)      Upper Body Dressing/Undressing Upper body dressing   What is the patient wearing?: Pull over shirt/dress     Pull over shirt/dress - Perfomed by patient: Thread/unthread right sleeve, Put head through opening, Thread/unthread left sleeve, Pull shirt over trunk Pull over shirt/dress - Perfomed by helper: Thread/unthread left sleeve, Pull shirt over trunk        Upper body assist Assist Level: Set up, Supervision or verbal cues      Lower Body Dressing/Undressing Lower body dressing   What is the  patient wearing?: Underwear, Pants, Shoes, AFO Underwear - Performed by patient: Thread/unthread right underwear leg, Pull underwear up/down, Thread/unthread left underwear leg Underwear - Performed by helper: Thread/unthread left underwear leg Pants- Performed by patient: Thread/unthread right pants leg, Pull pants up/down, Thread/unthread left pants leg Pants- Performed by helper: Thread/unthread left pants leg Non-skid slipper socks-  Performed by patient: Don/doff right sock Non-skid slipper socks- Performed by helper: Don/doff left sock Socks - Performed by patient: Don/doff right sock, Don/doff left sock   Shoes - Performed by patient: Don/doff right shoe, Fasten right, Fasten left Shoes - Performed by helper: Don/doff left shoe   AFO - Performed by helper: Don/doff left AFO      Lower body assist Assist for lower body dressing: Touching or steadying assistance (Pt > 75%) (min assist for standing balance/safety)      Toileting Toileting Toileting activity did not occur: N/A Toileting steps completed by patient: Performs perineal hygiene, Adjust clothing prior to toileting, Adjust clothing after toileting Toileting steps completed by helper: Adjust clothing prior to toileting, Performs perineal hygiene, Adjust clothing after toileting    Toileting assist Assist level: Touching or steadying assistance (Pt.75%)   Transfers Chair/bed transfer   Chair/bed transfer method: Stand pivot Chair/bed transfer assist level: Touching or steadying assistance (Pt > 75%) Chair/bed transfer assistive device: Biochemist, clinical lift: Landscape architect     Max distance: 50 Assist level: Supervision or verbal cues   Wheelchair   Type: Manual Max wheelchair distance: 75 Assist Level: Moderate assistance (Pt 50 - 74%)  Cognition Comprehension Comprehension assist level: Follows basic conversation/direction with extra time/assistive device  Expression Expression assist level: Expresses basic needs/ideas: With no assist  Social Interaction Social Interaction assist level: Interacts appropriately 75 - 89% of the time - Needs redirection for appropriate language or to initiate interaction.  Problem Solving Problem solving assist level: Solves basic 50 - 74% of the time/requires cueing 25 - 49% of the time  Memory Memory assist level: Recognizes or recalls 75 - 89% of the time/requires cueing 10 - 24% of the time     Medical Problem List and Plan: 1. Altered mental status with left sided weakness secondary to right MCA territory infarct as well as history of CVA September 2013 with residual left-sided weakness -Cont CIR  2. DVT Prophylaxis/Anticoagulation: SCDs. Monitor for any signs of DVT 3. Pain Management: Tylenol as needed 4. Mood: Buspar 5 mg twice a day 5. Neuropsych: This patient is capable of making decisions on his own behalf. 6. Skin/Wound Care: Routine skin checks 7. Fluids/Electrolytes/Nutrition: Routine I&O   Diet advanced to a heart healthy: Modified /23. Pain 8. Upper GI bleed. Follow-up per gastroenterology. Continue Protonix  Hemoglobin 9.0 on 6/28 (stable). 9. Ischemic cardiac heart disease. No chest pain or shortness of breath. Aspirin 81 mg daily. No Plavix at this time due to GI bleed 10. Hypertension. Corgard 40 mg daily. Follow with increased mobility 11. Hyponatremia. 136 on 6/28 (improved) 12. Diabetes mellitus. Hemoglobin A1c 5.6.   Lantus insulin 5 units daily, increased to 7U on 6/27.   Metformin 500 daily started on 6/30  Check blood sugars before meals and at bedtime.   Will continue to monitor and consider further adjustments with increased physical activity. 13. Pancreatitis. Continue Creon 24,000 units 3 times a day 14. Alcohol tobacco abuse. Nicoderm patch. Provide counseling 15. Klebsiella UTI. Completed course of Ceftin. 16.  Sleep Disturbance- trazodone increased to  on 6/27, avoid benzos with hx of  ETOH abuse  Improved  LOS (Days) 10 A FACE TO FACE EVALUATION WAS PERFORMED  Sybilla Malhotra Karis Jubanil Okey Zelek 03/22/2016 8:52 AM

## 2016-03-22 NOTE — Progress Notes (Signed)
Physical Therapy Session Note  Patient Details  Name: Brandon [name] MRN: [id number] Date of Birth: [birth year]  Today's Date: 03/22/2016 PT Individual Time: 1550-1630 PT Individual Time Calculation (min): 40 min   Short Term Goals: Week 2:  PT Short Term Goal 1 (Week 2): STG = LTG due to estimated d/c date.  Skilled Therapeutic Interventions/Progress Updates:    Pt received resting in w/c, c/o being "starved" but agreeable to therapy session.  NT requesting pt to attempt to use restroom and pt agreeable.  Pt amb into bathroom with steady assist and mod mutlimodal cues for walker positioning and sequencing of turn to face toilet.  Pt with progressive knee/trunk flexion while standing to void (+void) with constant cues for upright posture.  Pt required max cues to sequence exiting bathroom with RW as well as cues for safe walker positioning and upright posture.  Attempted NMR using LEs to propel w/c but pt unable to sequence despite max multimodal cues.  NMR via forced use and reciprocal stepping pattern at kinetron x30 seconds, + 2x1 minute, +2 minutes at level 30 cm/s and x2 minutes at level 20 cm/s.  Pt returned to room at end of session and positioned with QRB and chair alarm activated, call bell in reach and needs met.   Therapy Documentation Precautions:  Precautions Precautions: Fall Precaution Comments:  (L hemiplegia) Restrictions Weight Bearing Restrictions: No   See Function Navigator for Current Functional Status.   Therapy/Group: Individual Therapy  Earnest Conroy Penven-Crew 03/22/2016, 4:37 PM

## 2016-03-22 NOTE — Plan of Care (Signed)
Problem: RH BLADDER ELIMINATION Goal: RH STG MANAGE BLADDER WITH ASSISTANCE STG Manage Bladder With Min Assistance  Outcome: Not Progressing incont episodes

## 2016-03-22 NOTE — Progress Notes (Signed)
Speech Language Pathology Daily Session Note  Patient Details  Name: Brandon [name] MRN: [id number] Date of Birth: 05-16-1966  Today's Date: 03/22/2016 SLP Individual Time: 0800-0900 SLP Individual Time Calculation (min): 60 min  Short Term Goals: Week 2: SLP Short Term Goal 1 (Week 2): Pt will recognize and correct errors in the moment during basic tasks with min assist verbal cues  SLP Short Term Goal 2 (Week 2): Pt will complete semi-complex tasks with min assist verbal cues for functional problem solving.  SLP Short Term Goal 3 (Week 2): Patient will attend to left upper extremity and left of the environment during familiar tasks with supervision verbal cues SLP Short Term Goal 4 (Week 2): Patient will alternate attention for 5 minutes with Min assist verbal cues for redirection  SLP Short Term Goal 5 (Week 2): Patient will utilize external aids to assist with recall of new information with supervision verbal cues   Skilled Therapeutic Interventions:  Pt was seen for skilled ST targeting cognitive goals. Pt recalled activities of previous therapy sessions with mod I and asked if he needed to complete last attempted task as it was not finished during therapy session.  Pt identified remaining errors on previously targeted menu task with supervision question cues and more than a reasonable amount of time for initiation and to ask for help as needed.  Therapist also facilitated the session with a semi-complex deductive reasoning task targeting problem solving and awareness of errors.  Pt required overall mod assist verbal cues for organization, initiation, and working memory to achieve 100% accuracy during the abovementioned task.   Pt also required min assist to identify and correct errors in the moment during task.  Pt was returned to room and left with call bell within reach and quick release belt donned for safety.  Continue per current plan of care.    Function:  Eating Eating                Cognition Comprehension Comprehension assist level: Follows basic conversation/direction with no assist  Expression   Expression assist level: Expresses basic needs/ideas: With no assist  Social Interaction Social Interaction assist level: Interacts appropriately 75 - 89% of the time - Needs redirection for appropriate language or to initiate interaction.  Problem Solving Problem solving assist level: Solves basic 50 - 74% of the time/requires cueing 25 - 49% of the time  Memory Memory assist level: Recognizes or recalls 75 - 89% of the time/requires cueing 10 - 24% of the time    Pain Pain Assessment Pain Assessment: No/denies pain   Therapy/Group: Individual Therapy  Welby Montminy, Melanee SpryNicole L 03/22/2016, 11:50 AM

## 2016-03-22 NOTE — Progress Notes (Signed)
Occupational Therapy Session Notes  Patient Details  Name: Brandon [name] MRN: [id number] Date of Birth: 1965-10-13  Today's Date: 03/22/2016  Short Term Goals: Week 1:  OT Short Term Goal 1 (Week 1): Pt will be mod I with bed mobility as a precursor for ADLs  OT Short Term Goal 1 - Progress (Week 1): Met OT Short Term Goal 2 (Week 1): Pt will be educated on a functional strengthening HEP for LUE OT Short Term Goal 2 - Progress (Week 1): Met OT Short Term Goal 3 (Week 1): Pt will be min assist for toilet transfers OT Short Term Goal 3 - Progress (Week 1): Met OT Short Term Goal 4 (Week 1): Pt will be min assist for UB/LB dressing OT Short Term Goal 4 - Progress (Week 1): Met OT Short Term Goal 5 (Week 1): Pt will be educated on shower equipment for safety at home OT Short Term Goal 5 - Progress (Week 1): Met   Week 2:  OT Short Term Goal 1 (Week 2): STGs = LTGs   Skilled Therapeutic Interventions/Progress Updates:   Session #1 8315-1761 - 73 Minutes Individual Therapy Pt with 7/10 complaints of pain in back, RN aware and therapist monitored during session Pt found seated in w/c, hungry. RT gave pt some peanut butter and crackers. Pt with good memory regarding role of OT and what he's been doing with OT. Pt didn't necessarily want to shower as he stated "it takes too long" and "it's too much work". With min encouragement, pt willing to shower. Pt ambulated from w/c to BR using RW and min assist from therapist for shower stall transfer. Pt stood for cleaning of buttock as it is unsafe for pt to stand in shower without shoes at this time. Pt then sat on shower seat and doffed bilateral shoes & socks and performed UB/LB bathing in seated position. From here, pt dried and performed UB/LB dressing in sit to/from stand position, only standing with socks and shoes donned, including Lt AFO. Pt ambulated back to w/c and therapist assisted with pt propelling self to sink. Pt sat at sink for grooming  tasks of combing hair, applying deodorant, and shaving. During session, pt required mod cueing for initiation, attention, and problem solving. Focused skilled intervention on ADL retraining, sit to/from stands, dynamic standing balance/tolerance/endurance, functional use of Lt UE, and overall activity tolerance/endurance. At end of session, left pt seated in w/c with all needs within reach.   Session #2 1420-1445 - 25 minutes Individual Therapy No complaints of pain Pt found seated in w/c. Therapist propelled pt to tub room and educated pt on tub/shower transfer using tub transfer bench, would like for patient to get tub transfer bench for home use. Pt then propelled pt to therapy gym and worked on therapeutic exercise for LUE, using 3lb weighted bar; shoulder flexion/extension, protraction, horizontal ab/adduction, abduction/adduction, elbow flexion/extension - 2 sets of 10 for each exercise. Pt required max cueing for correct technique and sequencing during exercises, due to inattention to left side.  Therapist propelled patient back to room and left pt seated in w/c with all needs within reach and quick release belt + belt alarm donned.   Therapy Documentation Precautions:  Precautions Precautions: Fall Precaution Comments:  (L hemiplegia) Restrictions Weight Bearing Restrictions: No  Vital Signs: Therapy Vitals Temp: 98.3 F (36.8 C) Temp Source: Oral Pulse Rate: 79 Resp: 18 BP: 139/75 mmHg Patient Position (if appropriate): Lying Oxygen Therapy SpO2: 98 % O2 Device: Not Delivered  See Function Navigator for Current Functional Status.  Chrys Racer , MS, OTR/L, CLT  03/22/2016, 7:58 AM

## 2016-03-23 ENCOUNTER — Inpatient Hospital Stay (HOSPITAL_COMMUNITY): Payer: Medicaid Other

## 2016-03-23 LAB — GLUCOSE, CAPILLARY
GLUCOSE-CAPILLARY: 170 mg/dL — AB (ref 65–99)
GLUCOSE-CAPILLARY: 192 mg/dL — AB (ref 65–99)
Glucose-Capillary: 139 mg/dL — ABNORMAL HIGH (ref 65–99)
Glucose-Capillary: 209 mg/dL — ABNORMAL HIGH (ref 65–99)

## 2016-03-23 MED ORDER — INSULIN GLARGINE 100 UNIT/ML ~~LOC~~ SOLN
3.0000 [IU] | Freq: Once | SUBCUTANEOUS | Status: AC
  Administered 2016-03-23: 3 [IU] via SUBCUTANEOUS
  Filled 2016-03-23: qty 0.03

## 2016-03-23 MED ORDER — INSULIN GLARGINE 100 UNIT/ML ~~LOC~~ SOLN
10.0000 [IU] | Freq: Every day | SUBCUTANEOUS | Status: DC
  Administered 2016-03-24 – 2016-03-29 (×6): 10 [IU] via SUBCUTANEOUS
  Filled 2016-03-23 (×6): qty 0.1

## 2016-03-23 NOTE — Progress Notes (Signed)
Occupational Therapy Session Note  Patient Details  Name: Brandon [name] MRN: [id number] Date of Birth: Jun 23, 1966  Today's Date: 03/23/2016 OT Individual Time: 0900-1000 OT Individual Time Calculation (min): 60 min    Short Term Goals: Week 2:  OT Short Term Goal 1 (Week 2): STGs = LTGs  Skilled Therapeutic Interventions/Progress Updates:    Pt resting in bed upon arrival.  Pt immediately stated that he didn't need to wash up because he had taken showers the past three days.  [facility] and don clothing with min encouragement.  Pt experienced difficulty orienting his shirt correctly and initially donned his shirt backwards.  Pt attempted to don again bu putting his arms through the hole for his head.  Pt required min A to orient his shirt correctly.  Pt requested to use the toilet and amb with RW to bathroom.  Pt requested to stand to urinate and required cueing to plan how to turn in bathroom and stand at toilet.  Pt returned to room and sat in w/c to wash his hands and complete grooming tasks.  Pt transitioned to therapy gym and engaged in 10 mins on Nustep at work load 5.  Pt took multiple breaks (4) during exercise but denied being tired.  Pt returned to room and remained in w/c with QRB in place.   Therapy Documentation Precautions:  Precautions Precautions: Fall Precaution Comments:  (L hemiplegia) Restrictions Weight Bearing Restrictions: No  Pain:    See Function Navigator for Current Functional Status.   Therapy/Group: Individual Therapy  Rich BraveLanier, Caylor Cerino Chappell 03/23/2016, 10:06 AM

## 2016-03-23 NOTE — Progress Notes (Signed)
[place] PHYSICAL MEDICINE & REHABILITATION     PROGRESS NOTE  Subjective/Complaints:  Pt eating breakfast vigorously. Slept ok.   ROS: Denies CP, SOB, nausea, vomiting, diarrhea.   Objective: Vital Signs: Blood pressure 147/91, pulse 58, temperature 97.5 F (36.4 C), temperature source Oral, resp. rate 16, height 6\' 1"  (1.854 m), weight 86.5 kg (190 lb 11.2 oz), SpO2 100 %. No results found. No results for input(s): WBC, HGB, HCT, PLT in the last 72 hours. No results for input(s): NA, K, CL, GLUCOSE, BUN, CREATININE, CALCIUM in the last 72 hours.  Invalid input(s): CO CBG (last 3)   Recent Labs  03/22/16 1639 03/22/16 2057 03/23/16 0708  GLUCAP 179* 215* 139*    Wt Readings from Last 3 Encounters:  03/23/16 86.5 kg (190 lb 11.2 oz)  03/11/16 85.1 kg (187 lb 9.8 oz)  04/12/15 92.987 kg (205 lb)    Physical Exam:  BP 147/91 mmHg  Pulse 58  Temp(Src) 97.5 F (36.4 C) (Oral)  Resp 16  Ht 6\' 1"  (1.854 m)  Wt 86.5 kg (190 lb 11.2 oz)  BMI 25.16 kg/m2  SpO2 100% Constitutional: He appears well-developed and well-nourished.  HENT: Normocephalic and atraumatic.  Eyes: EOM are normal. Right eye exhibits no discharge. Left eye exhibits no discharge.  Cardiovascular: Normal rate and regular rhythm.  Respiratory: Effort normal and breath sounds normal. No respiratory distress.  GI: Soft. Bowel sounds are normal. He exhibits no distension.  Musculoskeletal: He exhibits no edema or tenderness.  Neurological: He is alert and oriented 3.  Mood is flat but appropriate.  Motor: Right upper extremity/right lower extremity: 5/5 proximal to distal Left upper extremity: 4/5 delt, bicep, wrist, tricep, HI with motor apraxia Left lower extremity: 4/5 HF, KE, 4+/5 ADF/PF with apraxia LUE tremor Skin: Skin is warm and dry.  Psychiatric: He has a normal mood and affect. His behavior is normal  Assessment/Plan: 1. Functional deficits secondary to right MCA territory infarct  as well as history of CVA with residual weakness which require 3+ hours per day of interdisciplinary therapy in a comprehensive inpatient rehab setting. Physiatrist is providing close team supervision and 24 hour management of active medical problems listed below. Physiatrist and rehab team continue to assess barriers to discharge/monitor patient progress toward functional and medical goals.  Function:  Bathing Bathing position Bathing activity did not occur: Refused Position: Systems developerhower  Bathing parts Body parts bathed by patient: Right arm, Left arm, Chest, Abdomen, Front perineal area, Buttocks, Right upper leg, Left upper leg, Right lower leg, Left lower leg Body parts bathed by helper: Back  Bathing assist Assist Level: Touching or steadying assistance(Pt > 75%)      Upper Body Dressing/Undressing Upper body dressing   What is the patient wearing?: Pull over shirt/dress     Pull over shirt/dress - Perfomed by patient: Thread/unthread right sleeve, Put head through opening, Thread/unthread left sleeve, Pull shirt over trunk Pull over shirt/dress - Perfomed by helper: Thread/unthread left sleeve, Pull shirt over trunk        Upper body assist Assist Level: Set up, Supervision or verbal cues   Set up : To obtain clothing/put away  Lower Body Dressing/Undressing Lower body dressing   What is the patient wearing?: Pants, Socks, Shoes, AFO Underwear - Performed by patient: Thread/unthread right underwear leg, Pull underwear up/down, Thread/unthread left underwear leg Underwear - Performed by helper: Thread/unthread left underwear leg Pants- Performed by patient: Thread/unthread right pants leg, Pull pants up/down, Thread/unthread left pants  leg Pants- Performed by helper: Thread/unthread left pants leg Non-skid slipper socks- Performed by patient: Don/doff right sock Non-skid slipper socks- Performed by helper: Don/doff left sock Socks - Performed by patient: Don/doff right sock,  Don/doff left sock   Shoes - Performed by patient: Don/doff right shoe, Fasten right, Fasten left Shoes - Performed by helper: Don/doff left shoe   AFO - Performed by helper: Don/doff left AFO      Lower body assist Assist for lower body dressing: Touching or steadying assistance (Pt > 75%)      Toileting Toileting Toileting activity did not occur: N/A Toileting steps completed by patient: Performs perineal hygiene, Adjust clothing prior to toileting, Adjust clothing after toileting Toileting steps completed by helper: Adjust clothing prior to toileting, Performs perineal hygiene, Adjust clothing after toileting Toileting Assistive Devices: Grab bar or rail  Toileting assist Assist level: Touching or steadying assistance (Pt.75%)   Transfers Chair/bed transfer   Chair/bed transfer method: Ambulatory Chair/bed transfer assist level: Touching or steadying assistance (Pt > 75%) Chair/bed transfer assistive device: Armrests, Biochemist, clinicalWalker Mechanical lift: Landscape architecttedy   Locomotion Ambulation     Max distance: 50 Assist level: Supervision or verbal cues   Wheelchair   Type: Manual Max wheelchair distance: 75 Assist Level: Moderate assistance (Pt 50 - 74%)  Cognition Comprehension Comprehension assist level: Follows basic conversation/direction with extra time/assistive device  Expression Expression assist level: Expresses basic needs/ideas: With no assist  Social Interaction Social Interaction assist level: Interacts appropriately 75 - 89% of the time - Needs redirection for appropriate language or to initiate interaction.  Problem Solving Problem solving assist level: Solves basic 50 - 74% of the time/requires cueing 25 - 49% of the time  Memory Memory assist level: Recognizes or recalls 75 - 89% of the time/requires cueing 10 - 24% of the time    Medical Problem List and Plan: 1. Altered mental status with left sided weakness secondary to right MCA territory infarct as well as history of  CVA September 2013 with residual left-sided weakness -Cont CIR therapies 2. DVT Prophylaxis/Anticoagulation: SCDs. Monitor for any signs of DVT 3. Pain Management: Tylenol as needed 4. Mood: Buspar 5 mg twice a day 5. Neuropsych: This patient is capable of making decisions on his own behalf. 6. Skin/Wound Care: Routine skin checks 7. Fluids/Electrolytes/Nutrition: Routine I&O   Diet advanced to a heart healthy: Modified /23. Pain 8. Upper GI bleed. Follow-up per gastroenterology. Continue Protonix  Hemoglobin 9.0 on 6/28 (stable). 9. Ischemic cardiac heart disease. No chest pain or shortness of breath. Aspirin 81 mg daily. No Plavix at this time due to GI bleed 10. Hypertension. Corgard 40 mg daily. Follow with increased mobility 11. Hyponatremia. 136 on 6/28 (improved) 12. Diabetes mellitus. Hemoglobin A1c 5.6.   Lantus insulin 5 units daily, increased to 7U on 6/27----increase to 10u   Metformin 500 daily started on 6/30---am CBG 139 today  -adjust further as indicated 13. Pancreatitis. Continue Creon 24,000 units 3 times a day 14. Alcohol tobacco abuse. Nicoderm patch. Provide counseling 15. Klebsiella UTI. Completed course of Ceftin. 16.  Sleep Disturbance- trazodone increased to 100mg  on 6/27, avoid benzos with hx of ETOH abuse  Improved  LOS (Days) 11 A FACE TO FACE EVALUATION WAS PERFORMED  SWARTZ,ZACHARY T 03/23/2016 9:19 AM

## 2016-03-24 ENCOUNTER — Inpatient Hospital Stay (HOSPITAL_COMMUNITY): Payer: Medicaid Other | Admitting: Physical Therapy

## 2016-03-24 DIAGNOSIS — E1165 Type 2 diabetes mellitus with hyperglycemia: Secondary | ICD-10-CM

## 2016-03-24 LAB — GLUCOSE, CAPILLARY
GLUCOSE-CAPILLARY: 135 mg/dL — AB (ref 65–99)
GLUCOSE-CAPILLARY: 144 mg/dL — AB (ref 65–99)
Glucose-Capillary: 174 mg/dL — ABNORMAL HIGH (ref 65–99)
Glucose-Capillary: 120 mg/dL — ABNORMAL HIGH (ref 65–99)

## 2016-03-24 NOTE — Progress Notes (Signed)
[place] PHYSICAL MEDICINE & REHABILITATION     PROGRESS NOTE  Subjective/Complaints:  Pt up at EOB eating breakfast. Continues to display poor insight and awareness   ROS: Denies CP, SOB, nausea, vomiting, diarrhea.   Objective: Vital Signs: Blood pressure 154/71, pulse 59, temperature 97.7 F (36.5 C), temperature source Oral, resp. rate 17, height 6\' 1"  (1.854 m), weight 85.8 kg (189 lb 2.5 oz), SpO2 100 %. No results found. No results for input(s): WBC, HGB, HCT, PLT in the last 72 hours. No results for input(s): NA, K, CL, GLUCOSE, BUN, CREATININE, CALCIUM in the last 72 hours.  Invalid input(s): CO CBG (last 3)   Recent Labs  03/23/16 1625 03/23/16 2059 03/24/16 0648  GLUCAP 170* 192* 135*    Wt Readings from Last 3 Encounters:  03/24/16 85.8 kg (189 lb 2.5 oz)  03/11/16 85.1 kg (187 lb 9.8 oz)  04/12/15 92.987 kg (205 lb)    Physical Exam:  BP 154/71 mmHg  Pulse 59  Temp(Src) 97.7 F (36.5 C) (Oral)  Resp 17  Ht 6\' 1"  (1.854 m)  Wt 85.8 kg (189 lb 2.5 oz)  BMI 24.96 kg/m2  SpO2 100% Constitutional: He appears well-developed and well-nourished.  HENT: Normocephalic and atraumatic.  Eyes: EOM are normal. Right eye exhibits no discharge. Left eye exhibits no discharge.  Cardiovascular: Normal rate and regular rhythm.  Respiratory: Effort normal and breath sounds normal. No respiratory distress.  GI: Soft. Bowel sounds are normal. He exhibits no distension.  Musculoskeletal: He exhibits no edema or tenderness.  Neurological: He is alert and oriented 3.  Mood is flat but appropriate. Limited insight and awareness  Motor: Right upper extremity/right lower extremity: 5/5 proximal to distal Left upper extremity: 4/5 delt, bicep, wrist, tricep, HI with motor apraxia Left lower extremity: 4/5 HF, KE, 4+/5 ADF/PF with apraxia LUE tremor Skin: Skin is warm and dry.  Psychiatric: He has a normal mood and affect. His behavior is  normal  Assessment/Plan: 1. Functional deficits secondary to right MCA territory infarct as well as history of CVA with residual weakness which require 3+ hours per day of interdisciplinary therapy in a comprehensive inpatient rehab setting. Physiatrist is providing close team supervision and 24 hour management of active medical problems listed below. Physiatrist and rehab team continue to assess barriers to discharge/monitor patient progress toward functional and medical goals.  Function:  Bathing Bathing position Bathing activity did not occur: Refused Position: Systems developerhower  Bathing parts Body parts bathed by patient: Right arm, Left arm, Chest, Abdomen, Front perineal area, Buttocks, Right upper leg, Left upper leg, Right lower leg, Left lower leg Body parts bathed by helper: Back  Bathing assist Assist Level: Touching or steadying assistance(Pt > 75%)      Upper Body Dressing/Undressing Upper body dressing   What is the patient wearing?: Pull over shirt/dress     Pull over shirt/dress - Perfomed by patient: Thread/unthread right sleeve, Put head through opening, Thread/unthread left sleeve, Pull shirt over trunk Pull over shirt/dress - Perfomed by helper: Thread/unthread left sleeve, Pull shirt over trunk        Upper body assist Assist Level: Set up, Supervision or verbal cues   Set up : To obtain clothing/put away  Lower Body Dressing/Undressing Lower body dressing   What is the patient wearing?: AFO, Shoes, Pants Underwear - Performed by patient: Thread/unthread right underwear leg, Pull underwear up/down, Thread/unthread left underwear leg Underwear - Performed by helper: Thread/unthread left underwear leg Pants- Performed by patient:  Thread/unthread right pants leg, Thread/unthread left pants leg, Pull pants up/down Pants- Performed by helper: Thread/unthread left pants leg Non-skid slipper socks- Performed by patient: Don/doff right sock Non-skid slipper socks- Performed by  helper: Don/doff left sock Socks - Performed by patient: Don/doff right sock, Don/doff left sock   Shoes - Performed by patient: Don/doff right shoe, Fasten right, Fasten left Shoes - Performed by helper: Don/doff left shoe   AFO - Performed by helper: Don/doff left AFO      Lower body assist Assist for lower body dressing: Touching or steadying assistance (Pt > 75%)      Toileting Toileting Toileting activity did not occur: N/A Toileting steps completed by patient: Performs perineal hygiene, Adjust clothing prior to toileting, Adjust clothing after toileting Toileting steps completed by helper: Adjust clothing prior to toileting, Performs perineal hygiene, Adjust clothing after toileting Toileting Assistive Devices: Grab bar or rail  Toileting assist Assist level: Touching or steadying assistance (Pt.75%)   Transfers Chair/bed transfer   Chair/bed transfer method: Ambulatory Chair/bed transfer assist level: Touching or steadying assistance (Pt > 75%) Chair/bed transfer assistive device: Armrests, Biochemist, clinicalWalker Mechanical lift: Landscape architecttedy   Locomotion Ambulation     Max distance: 50 Assist level: Supervision or verbal cues   Wheelchair   Type: Manual Max wheelchair distance: 75 Assist Level: Moderate assistance (Pt 50 - 74%)  Cognition Comprehension Comprehension assist level: Follows basic conversation/direction with extra time/assistive device  Expression Expression assist level: Expresses basic needs/ideas: With no assist  Social Interaction Social Interaction assist level: Interacts appropriately 75 - 89% of the time - Needs redirection for appropriate language or to initiate interaction.  Problem Solving Problem solving assist level: Solves basic 50 - 74% of the time/requires cueing 25 - 49% of the time  Memory Memory assist level: Recognizes or recalls 75 - 89% of the time/requires cueing 10 - 24% of the time    Medical Problem List and Plan: 1. Altered mental status with left  sided weakness secondary to right MCA territory infarct as well as history of CVA September 2013 with residual left-sided weakness -Cont CIR therapies 2. DVT Prophylaxis/Anticoagulation: SCDs. Monitor for any signs of DVT 3. Pain Management: Tylenol as needed 4. Mood: Buspar 5 mg twice a day 5. Neuropsych: This patient is capable of making decisions on his own behalf. 6. Skin/Wound Care: Routine skin checks 7. Fluids/Electrolytes/Nutrition: Routine I&O   Diet advanced to a heart healthy: Modified /23. Pain 8. Upper GI bleed. Follow-up per gastroenterology. Continue Protonix  Hemoglobin 9.0 on 6/28 (stable). 9. Ischemic cardiac heart disease. No chest pain or shortness of breath. Aspirin 81 mg daily. No Plavix at this time due to GI bleed 10. Hypertension. Corgard 40 mg daily. Follow with increased mobility 11. Hyponatremia. 136 on 6/28 (improved) 12. Diabetes mellitus. Hemoglobin A1c 5.6.   Lantus insulin 5 units daily, increased to 10u on 7/1  Metformin 500 daily started on 6/30---am CBG 135 today  -further adjustment likely needed 13. Pancreatitis. Continue Creon 24,000 units 3 times a day 14. Alcohol tobacco abuse. Nicoderm patch. Provide counseling 15. Klebsiella UTI. Completed course of Ceftin. 16.  Sleep Disturbance- trazodone increased to 100mg  on 6/27 with improvement  - avoid benzos with hx of ETOH abuse     LOS (Days) 12 A FACE TO FACE EVALUATION WAS PERFORMED  SWARTZ,ZACHARY T 03/24/2016 8:51 AM

## 2016-03-24 NOTE — Progress Notes (Signed)
Physical Therapy Session Note  Patient Details  Name: Marcelle SmilingKeith W Kissel MRN: [id number] Date of Birth: 02/20/1966  Today's Date: 03/24/2016 PT Individual Time: 0905-1001 PT Individual Time Calculation (min): 56 min   Short Term Goals: Week 2:  PT Short Term Goal 1 (Week 2): STG = LTG due to estimated d/c date.  Skilled Therapeutic Interventions/Progress Updates:    Pt received in bed & agreeable to PT, noting 6/10 low back & R LE pain. Pt c/o R LE pain when ambulating due to compensation of LLE. Pt required multiple cues to transfer to sitting EOB as pt appeared with delayed processing, as he would state he would sit up but then did not move. Pt able to transfer supine>sitting EOB with supervision & don R shoe with supervision but required Mod A to don L shoe & AFO. Completed sit<>stand transfers with supervision and gait training x 100 ft with RW & R AFO. Pt with significant trunk flexion, decreased foot clearance LLE & decreased hip & knee flexion. Pt ambulated with step to pattern leading with LLE. In gym pt completed Berg Balance Test & scored 26/56; educated pt on interpretation of score & need for supervision assistance & RW to prevent falls. Patient demonstrates increased fall risk as noted by score of 26/56 on Berg Balance Scale.  (<36= high risk for falls, close to 100%; 37-45 significant >80%; 46-51 moderate >50%; 52-55 lower >25%). At end of session pt transported back to room & left in w/c with QRB & alarm belt in place & all needs within reach.    Therapy Documentation Precautions:  Precautions Precautions: Fall Precaution Comments:  (L hemiplegia) Restrictions Weight Bearing Restrictions: No  Pain: Pain Assessment Pain Assessment: 0-10 Pain Score: 6  Pain Location: Back (& R hip) Pain Intervention(s): Repositioned;Ambulation/increased activity  Balance: Balance Balance Assessed: Yes Standardized Balance Assessment Standardized Balance Assessment: Berg Balance Test Sharlene Motts(Berg  completed with R AFO donned) Berg Balance Test Sit to Stand: Able to stand  independently using hands Standing Unsupported: Able to stand 2 minutes with supervision Sitting with Back Unsupported but Feet Supported on Floor or Stool: Able to sit safely and securely 2 minutes Stand to Sit: Controls descent by using hands Transfers: Able to transfer with verbal cueing and /or supervision Standing Unsupported with Eyes Closed: Able to stand 10 seconds with supervision Standing Ubsupported with Feet Together: Able to place feet together independently but unable to hold for 30 seconds From Standing, Reach Forward with Outstretched Arm: Loses balance while trying/requires external support (significant difficulty sequencing task even with max multimodal cuing) From Standing Position, Pick up Object from Floor: Able to pick up shoe, needs supervision From Standing Position, Turn to Look Behind Over each Shoulder: Needs supervision when turning Turn 360 Degrees: Needs assistance while turning Standing Unsupported, Alternately Place Feet on Step/Stool: Needs assistance to keep from falling or unable to try Standing Unsupported, One Foot in Front: Needs help to step but can hold 15 seconds Standing on One Leg: Tries to lift leg/unable to hold 3 seconds but remains standing independently Total Score: 26   See Function Navigator for Current Functional Status.   Therapy/Group: Individual Therapy  Sandi MariscalVictoria M Twylla Arceneaux 03/24/2016, 7:55 AM

## 2016-03-24 NOTE — Plan of Care (Signed)
Problem: RH BLADDER ELIMINATION Goal: RH STG MANAGE BLADDER WITH ASSISTANCE STG Manage Bladder With Min Assistance  Outcome: Not Progressing Pt unaware when needs to void, when wet, not calling staff for assistance to use urinal or to be changed.  Attempt time toilet without success, condom cath applied HS

## 2016-03-25 ENCOUNTER — Inpatient Hospital Stay (HOSPITAL_COMMUNITY): Payer: Medicaid Other

## 2016-03-25 ENCOUNTER — Inpatient Hospital Stay (HOSPITAL_COMMUNITY): Payer: Medicaid Other | Admitting: Speech Pathology

## 2016-03-25 ENCOUNTER — Inpatient Hospital Stay (HOSPITAL_COMMUNITY): Payer: Medicaid Other | Admitting: Physical Therapy

## 2016-03-25 LAB — GLUCOSE, CAPILLARY
GLUCOSE-CAPILLARY: 121 mg/dL — AB (ref 65–99)
GLUCOSE-CAPILLARY: 204 mg/dL — AB (ref 65–99)
Glucose-Capillary: 127 mg/dL — ABNORMAL HIGH (ref 65–99)
Glucose-Capillary: 147 mg/dL — ABNORMAL HIGH (ref 65–99)

## 2016-03-25 NOTE — Progress Notes (Signed)
Physical Therapy Session Note  Patient Details  Name: Marcelle SmilingKeith W Dement MRN: [id number] Date of Birth: [birth year]  Today's Date: 03/25/2016 PT Individual Time: 1502-1530 PT Individual Time Calculation (min): 28 min   Short Term Goals: Week 2:  PT Short Term Goal 1 (Week 2): STG = LTG due to estimated d/c date.  Skilled Therapeutic Interventions/Progress Updates:    Pt received in w/c & agreeable to PT, denying c/o pain. Transported pt to gym via w/c total A for time management. To promote weight bearing through LLE pt stood with RLE on step and attempted to reach for objects with LUE, with use of mirror for visual feedback. Pt with significant trunk, hip & knee flexion and inability to correct posture with visual & tactile feedback. Pt also unable to shift weight to left and continued to weight shift to right which limited reaching. Pt then transferred to sitting before stepping off of step even with PT educating pt to wait; PT provided mod A to safely guide pt to w/c. Educated pt on need to follow therapist instructions for safety and to ensure chair is behind him. Transported pt to [employer] gym and utilized dynavision in standing with supervision & LUE only to address L neglect. Pt with delayed reaction time to upper left quadrant but improved reaching overhead with LUE. At end of session pt transported back to room & left in w/c with QRB & alarm belt in place & all needs within reach. Alarm belt turned on but not alarming when removed, this therapist notified RN of this.  Therapy Documentation Precautions:  Precautions Precautions: Fall Precaution Comments:  (L hemiplegia) Restrictions Weight Bearing Restrictions: No  Pain: Pain Assessment Pain Assessment: No/denies pain   See Function Navigator for Current Functional Status.   Therapy/Group: Individual Therapy  Sandi MariscalVictoria M Zeplin Aleshire 03/25/2016, 5:09 PM

## 2016-03-25 NOTE — Progress Notes (Signed)
Occupational Therapy Session Note  Patient Details  Name: Brandon [name] MRN: [id number] Date of Birth: 12/02/1965  Today's Date: 03/25/2016 OT Individual Time: 2952-84130930-1045 OT Individual Time Calculation (min): 75 min    Short Term Goals: Week 2:  OT Short Term Goal 1 (Week 2): STGs = LTGs  Skilled Therapeutic Interventions/Progress Updates:    Pt resting in w/c upon arrival and agreeable to participating in therapy.  Pt engaged in BADL retraining including bathing at shower level and dressing with sit<>stand from w/c at sink.  Pt continues to exhibit moderate left inattention during functional tasks.  Pt exhibits difficulty lifting his LLE and requires assistance with LLE dressing tasks.  Pt exhibited some perseveration with bathing tasks.  Pt required max verbal cues for LLE placement prior to transfers and sit<>stand at sink.  Pt requires more than a reasonable amount of time to complete tasks.  Focus on activity tolerance, task initiation, attention to left, safety awareness, sit<>stand, standing balance, and functional transfers to increase independence with BADLs.  Therapy Documentation Precautions:  Precautions Precautions: Fall Precaution Comments:  (L hemiplegia) Restrictions Weight Bearing Restrictions: No Pain:  Pt denied pain  See Function Navigator for Current Functional Status.   Therapy/Group: Individual Therapy  Rich BraveLanier, Rosia Syme Chappell 03/25/2016, 10:49 AM

## 2016-03-25 NOTE — Plan of Care (Signed)
Problem: RH SAFETY Goal: RH STG ADHERE TO SAFETY PRECAUTIONS W/ASSISTANCE/DEVICE STG Adhere to Safety Precautions With min Assistance/Device.  Outcome: Not Progressing Poor safety awareness, fails to call before getting up

## 2016-03-25 NOTE — Plan of Care (Signed)
Problem: RH BLADDER ELIMINATION Goal: RH STG MANAGE BLADDER WITH ASSISTANCE STG Manage Bladder With Min Assistance  Outcome: Not Progressing [facility] during the day even with timed toileting; no awareness of need to go and cannot hold once started

## 2016-03-25 NOTE — Progress Notes (Signed)
Speech Language Pathology Daily Session Note  Patient Details  Name: Brandon [name] MRN: [id number] Date of Birth: 1966/02/13  Today's Date: 03/25/2016 SLP Individual Time: 0803-0900 SLP Individual Time Calculation (min): 57 min  Short Term Goals: Week 2: SLP Short Term Goal 1 (Week 2): Pt will recognize and correct errors in the moment during basic tasks with min assist verbal cues  SLP Short Term Goal 2 (Week 2): Pt will complete semi-complex tasks with min assist verbal cues for functional problem solving.  SLP Short Term Goal 3 (Week 2): Patient will attend to left upper extremity and left of the environment during familiar tasks with supervision verbal cues SLP Short Term Goal 4 (Week 2): Patient will alternate attention for 5 minutes with Min assist verbal cues for redirection  SLP Short Term Goal 5 (Week 2): Patient will utilize external aids to assist with recall of new information with supervision verbal cues   Skilled Therapeutic Interventions:  Pt was seen for skilled ST targeting cognitive goals.  Pt sitting at edge of bed upon arrival with nursing.  Min assist verbal cues needed for safety awareness due to impulsivity and left inattention while reaching for a coffee cup with his right hand while he already had a coffee cup in his left hand.  Min assist verbal cues needed to ask for assistance when ambulating to and from toilet and for hygiene after having a bowel movement.  Pt able to complete basic sinkside ADLs with overall supervision verbal cues for task organization left attention.  Pt was left in wheelchair with call bell within reach and quick release belt donned.  Continue per current plan of care.    Function:  Eating Eating              Cognition Comprehension Comprehension assist level: Follows basic conversation/direction with no assist  Expression   Expression assist level: Expresses basic 90% of the time/requires cueing < 10% of the time.  Social  Interaction Social Interaction assist level: Interacts appropriately 90% of the time - Needs monitoring or encouragement for participation or interaction.  Problem Solving Problem solving assist level: Solves basic 50 - 74% of the time/requires cueing 25 - 49% of the time  Memory Memory assist level: Recognizes or recalls 75 - 89% of the time/requires cueing 10 - 24% of the time    Pain Pain Assessment Pain Assessment: No/denies pain  Therapy/Group: Individual Therapy  Marysol Wellnitz, Melanee SpryNicole L 03/25/2016, 12:13 PM

## 2016-03-25 NOTE — Progress Notes (Signed)
Occupational Therapy Note  Patient Details  Name: Brandon [name] MRN: [id number] Date of Birth: December 22, 1965  Today's Date: 03/25/2016 OT Individual Time: 1400-1430 OT Individual Time Calculation (min): 30 min   Pt denied pain Individual therapy  Pt resting in w/c with QRB in place.  Pt declined needing to use bathroom.  Pt engaged in table activity with focus on problem solving and BUE use to construct PVC pipe structure.  Pt assembled structures correctly, requiring more than a reasonable amount of time, with no verbal cues.  Pt utilized his LUE appropriately to assemble structures but exhibited difficulty deconstructing secondary to decreased grip strength.  Pt remained in w/c with QRB in place.    Lavone NeriLanier, Brandon [name] [facility] 03/25/2016, 2:57 PM

## 2016-03-25 NOTE — Progress Notes (Signed)
[place] PHYSICAL MEDICINE & REHABILITATION     PROGRESS NOTE  Subjective/Complaints:  Pt sitting at the edge of the bed. He states the weekends and the weekdays are the same in the hospital, when asked how his weekend was.    ROS: Denies CP, SOB, nausea, vomiting, diarrhea.   Objective: Vital Signs: Blood pressure 138/81, pulse 57, temperature 97.4 F (36.3 C), temperature source Oral, resp. rate 17, height 6\' 1"  (1.854 m), weight 85.5 kg (188 lb 7.9 oz), SpO2 99 %. No results found. No results for input(s): WBC, HGB, HCT, PLT in the last 72 hours. No results for input(s): NA, K, CL, GLUCOSE, BUN, CREATININE, CALCIUM in the last 72 hours.  Invalid input(s): CO CBG (last 3)   Recent Labs  03/24/16 1628 03/24/16 2102 03/25/16 0643  GLUCAP 174* 120* 127*    Wt Readings from Last 3 Encounters:  03/25/16 85.5 kg (188 lb 7.9 oz)  03/11/16 85.1 kg (187 lb 9.8 oz)  04/12/15 92.987 kg (205 lb)    Physical Exam:  BP 138/81 mmHg  Pulse 57  Temp(Src) 97.4 F (36.3 C) (Oral)  Resp 17  Ht 6\' 1"  (1.854 m)  Wt 85.5 kg (188 lb 7.9 oz)  BMI 24.87 kg/m2  SpO2 99% Constitutional: He appears well-developed and well-nourished.  HENT: Normocephalic and atraumatic.  Eyes: EOM are normal. Right eye exhibits no discharge. Left eye exhibits no discharge.  Cardiovascular: Normal rate and regular rhythm.  Respiratory: Effort normal and breath sounds normal. No respiratory distress.  GI: Soft. Bowel sounds are normal. He exhibits no distension.  Musculoskeletal: He exhibits no edema or tenderness.  Neurological: He is alert and oriented 3.  Mood is flat but appropriate. Limited insight and awareness  Motor: Right upper extremity/right lower extremity: 5/5 proximal to distal Left upper extremity: 4+/5 delt, bicep, wrist, tricep, HI with motor apraxia (improving) Left lower extremity: 4+/5 HF, KE, 4+/5 ADF/PF with apraxia(improving) Skin: Skin is warm and dry.  Psychiatric: He has  a normal mood and affect. His behavior is normal  Assessment/Plan: 1. Functional deficits secondary to right MCA territory infarct as well as history of CVA with residual weakness which require 3+ hours per day of interdisciplinary therapy in a comprehensive inpatient rehab setting. Physiatrist is providing close team supervision and 24 hour management of active medical problems listed below. Physiatrist and rehab team continue to assess barriers to discharge/monitor patient progress toward functional and medical goals.  Function:  Bathing Bathing position Bathing activity did not occur: Refused Position: Systems developerhower  Bathing parts Body parts bathed by patient: Right arm, Left arm, Chest, Abdomen, Front perineal area, Buttocks, Right upper leg, Left upper leg, Right lower leg, Left lower leg Body parts bathed by helper: Back  Bathing assist Assist Level: Touching or steadying assistance(Pt > 75%)      Upper Body Dressing/Undressing Upper body dressing   What is the patient wearing?: Pull over shirt/dress     Pull over shirt/dress - Perfomed by patient: Thread/unthread right sleeve, Put head through opening, Thread/unthread left sleeve, Pull shirt over trunk Pull over shirt/dress - Perfomed by helper: Thread/unthread left sleeve, Pull shirt over trunk        Upper body assist Assist Level: Set up, Supervision or verbal cues   Set up : To obtain clothing/put away  Lower Body Dressing/Undressing Lower body dressing   What is the patient wearing?: AFO, Shoes, Pants Underwear - Performed by patient: Thread/unthread right underwear leg, Pull underwear up/down, Thread/unthread left underwear  leg Underwear - Performed by helper: Thread/unthread left underwear leg Pants- Performed by patient: Thread/unthread right pants leg, Thread/unthread left pants leg, Pull pants up/down Pants- Performed by helper: Thread/unthread left pants leg Non-skid slipper socks- Performed by patient: Don/doff right  sock Non-skid slipper socks- Performed by helper: Don/doff left sock Socks - Performed by patient: Don/doff right sock, Don/doff left sock   Shoes - Performed by patient: Don/doff right shoe, Fasten right, Fasten left Shoes - Performed by helper: Don/doff left shoe   AFO - Performed by helper: Don/doff left AFO      Lower body assist Assist for lower body dressing: Touching or steadying assistance (Pt > 75%)      Toileting Toileting Toileting activity did not occur: N/A Toileting steps completed by patient: Adjust clothing prior to toileting, Performs perineal hygiene, Adjust clothing after toileting Toileting steps completed by helper: Performs perineal hygiene, Adjust clothing prior to toileting, Adjust clothing after toileting Toileting Assistive Devices: Grab bar or rail  Toileting assist Assist level: Touching or steadying assistance (Pt.75%)   Transfers Chair/bed transfer   Chair/bed transfer method: Stand pivot Chair/bed transfer assist level: Supervision or verbal cues Chair/bed transfer assistive device: Armrests, Biochemist, clinicalWalker Mechanical lift: Landscape architecttedy   Locomotion Ambulation     Max distance: 100 ft Assist level: Supervision or verbal cues   Wheelchair   Type: Manual Max wheelchair distance: 75 Assist Level: Moderate assistance (Pt 50 - 74%)  Cognition Comprehension Comprehension assist level: Follows complex conversation/direction with extra time/assistive device  Expression Expression assist level: Expresses basic needs/ideas: With no assist  Social Interaction Social Interaction assist level: Interacts appropriately 75 - 89% of the time - Needs redirection for appropriate language or to initiate interaction.  Problem Solving Problem solving assist level: Solves basic 50 - 74% of the time/requires cueing 25 - 49% of the time  Memory Memory assist level: Recognizes or recalls 75 - 89% of the time/requires cueing 10 - 24% of the time    Medical Problem List and  Plan: 1. Altered mental status with left sided weakness secondary to right MCA territory infarct as well as history of CVA September 2013 with residual left-sided weakness -Cont CIR therapies 2. DVT Prophylaxis/Anticoagulation: SCDs. Monitor for any signs of DVT 3. Pain Management: Tylenol as needed 4. Mood: Buspar 5 mg twice a day 5. Neuropsych: This patient is capable of making decisions on his own behalf. 6. Skin/Wound Care: Routine skin checks 7. Fluids/Electrolytes/Nutrition: Routine I&O   Diet advanced to a heart healthy: Modified /23. Pain 8. Upper GI bleed. Follow-up per gastroenterology. Continue Protonix  Hemoglobin 9.0 on 6/28 (stable). 9. Ischemic cardiac heart disease. No chest pain or shortness of breath. Aspirin 81 mg daily. No Plavix at this time due to GI bleed 10. Hypertension. Corgard 40 mg daily. Follow with increased mobility 11. Hyponatremia. 136 on 6/28 (improved) 12. Diabetes mellitus. Hemoglobin A1c 5.6.   Lantus insulin 5 units daily, increased to 10u on 7/1  Metformin 500 daily started on 6/30  -Will cont to monitor 13. Pancreatitis. Continue Creon 24,000 units 3 times a day 14. Alcohol tobacco abuse. Nicoderm patch. Provide counseling 15. Klebsiella UTI. Completed course of Ceftin. 16.  Sleep Disturbance- trazodone increased to 100mg  on 6/27 with improvement  - avoid benzos with hx of ETOH abuse     LOS (Days) 13 A FACE TO FACE EVALUATION WAS PERFORMED  Riyaan Heroux Karis Jubanil Dulcemaria Bula 03/25/2016 9:03 AM

## 2016-03-26 ENCOUNTER — Inpatient Hospital Stay (HOSPITAL_COMMUNITY): Payer: Medicaid Other | Admitting: Speech Pathology

## 2016-03-26 ENCOUNTER — Inpatient Hospital Stay (HOSPITAL_COMMUNITY): Payer: Medicaid Other | Admitting: Occupational Therapy

## 2016-03-26 ENCOUNTER — Inpatient Hospital Stay (HOSPITAL_COMMUNITY): Payer: Medicaid Other | Admitting: Physical Therapy

## 2016-03-26 DIAGNOSIS — H04123 Dry eye syndrome of bilateral lacrimal glands: Secondary | ICD-10-CM

## 2016-03-26 LAB — CBC WITH DIFFERENTIAL/PLATELET
Basophils Absolute: 0 10*3/uL (ref 0.0–0.1)
Basophils Relative: 1 %
EOS PCT: 2 %
Eosinophils Absolute: 0.1 10*3/uL (ref 0.0–0.7)
HCT: 30.2 % — ABNORMAL LOW (ref 39.0–52.0)
HEMOGLOBIN: 9.3 g/dL — AB (ref 13.0–17.0)
LYMPHS ABS: 2.2 10*3/uL (ref 0.7–4.0)
LYMPHS PCT: 30 %
MCH: 27.7 pg (ref 26.0–34.0)
MCHC: 30.8 g/dL (ref 30.0–36.0)
MCV: 89.9 fL (ref 78.0–100.0)
MONOS PCT: 9 %
Monocytes Absolute: 0.6 10*3/uL (ref 0.1–1.0)
NEUTROS PCT: 58 %
Neutro Abs: 4.4 10*3/uL (ref 1.7–7.7)
Platelets: 470 10*3/uL — ABNORMAL HIGH (ref 150–400)
RBC: 3.36 MIL/uL — AB (ref 4.22–5.81)
RDW: 16.8 % — ABNORMAL HIGH (ref 11.5–15.5)
WBC: 7.4 10*3/uL (ref 4.0–10.5)

## 2016-03-26 LAB — BASIC METABOLIC PANEL
Anion gap: 5 (ref 5–15)
BUN: 12 mg/dL (ref 6–20)
CHLORIDE: 103 mmol/L (ref 101–111)
CO2: 28 mmol/L (ref 22–32)
Calcium: 9.2 mg/dL (ref 8.9–10.3)
Creatinine, Ser: 0.69 mg/dL (ref 0.61–1.24)
GFR calc Af Amer: >60 mL/min (ref >60)
GFR calc non Af Amer: >60 mL/min (ref >60)
Glucose, Bld: 130 mg/dL — ABNORMAL HIGH (ref 65–99)
POTASSIUM: 4.1 mmol/L (ref 3.5–5.1)
SODIUM: 136 mmol/L (ref 135–145)

## 2016-03-26 LAB — GLUCOSE, CAPILLARY
GLUCOSE-CAPILLARY: 213 mg/dL — AB (ref 65–99)
Glucose-Capillary: 120 mg/dL — ABNORMAL HIGH (ref 65–99)
Glucose-Capillary: 134 mg/dL — ABNORMAL HIGH (ref 65–99)
Glucose-Capillary: 120 mg/dL — ABNORMAL HIGH (ref 65–99)

## 2016-03-26 MED ORDER — METFORMIN HCL 500 MG PO TABS
500.0000 mg | ORAL_TABLET | Freq: Two times a day (BID) | ORAL | Status: DC
  Administered 2016-03-26 – 2016-03-27 (×3): 500 mg via ORAL
  Filled 2016-03-26 (×3): qty 1

## 2016-03-26 MED ORDER — POLYVINYL ALCOHOL 1.4 % OP SOLN
1.0000 [drp] | OPHTHALMIC | Status: DC | PRN
  Filled 2016-03-26: qty 15

## 2016-03-26 NOTE — Progress Notes (Signed)
Speech Language Pathology Daily Session Note  Patient Details  Name: Brandon [name] MRN: [id number] Date of Birth: 07/02/1966  Today's Date: 03/26/2016 SLP Individual Time: 0802-0900 SLP Individual Time Calculation (min): 58 min  Short Term Goals: Week 2: SLP Short Term Goal 1 (Week 2): Pt will recognize and correct errors in the moment during basic tasks with min assist verbal cues  SLP Short Term Goal 2 (Week 2): Pt will complete semi-complex tasks with min assist verbal cues for functional problem solving.  SLP Short Term Goal 3 (Week 2): Patient will attend to left upper extremity and left of the environment during familiar tasks with supervision verbal cues SLP Short Term Goal 4 (Week 2): Patient will alternate attention for 5 minutes with Min assist verbal cues for redirection  SLP Short Term Goal 5 (Week 2): Patient will utilize external aids to assist with recall of new information with supervision verbal cues   Skilled Therapeutic Interventions:  Pt was seen for skilled ST targeting cognitive goals.  Upon therapist's arrival, pt independently initiated request for assistance for removing condom cath due to pain.  Min verbal cues needed to attend to left upper extremity during transfer from bed to wheelchair.  RN made aware and assisted in removal. Therapist facilitated the session with a novel card game targeting memory.  Pt was able to generate word-picture associations with supervision instructional cues.  Pt then immediately recalled 4 out of 4 categories via associations with supervision.  Pt was able to generate and recall specific category members with supervision verbal cues.   Max assist verbal cues were needed to identify deficits s/p CVA and their impact on his functional independence in the home environment.  Family has not been present for training up until this point and therapist is concerned about discharge plan later this week.  CSW made aware of recommendations for 24/7  supervision and family training prior to discharge.  Continue per current plan of care.    Function:  Eating Eating                 Cognition Comprehension Comprehension assist level: Follows basic conversation/direction with no assist  Expression   Expression assist level: Expresses basic 90% of the time/requires cueing < 10% of the time.  Social Interaction Social Interaction assist level: Interacts appropriately 90% of the time - Needs monitoring or encouragement for participation or interaction.  Problem Solving Problem solving assist level: Solves basic 75 - 89% of the time/requires cueing 10 - 24% of the time  Memory Memory assist level: Recognizes or recalls 75 - 89% of the time/requires cueing 10 - 24% of the time    Pain Pain Assessment Pain Assessment: No/denies pain  Therapy/Group: Individual Therapy  Najiyah Paris, Melanee SpryNicole L 03/26/2016, 10:33 AM

## 2016-03-26 NOTE — Progress Notes (Signed)
Speech Language Pathology Daily Session Note  Patient Details  Name: Brandon [name] MRN: [id number] Date of Birth: Nov 14, 1965  Today's Date: 03/26/2016 SLP Individual Time: 1103-1130 SLP Individual Time Calculation (min): 27 min  Short Term Goals: Week 2: SLP Short Term Goal 1 (Week 2): Pt will recognize and correct errors in the moment during basic tasks with min assist verbal cues  SLP Short Term Goal 2 (Week 2): Pt will complete semi-complex tasks with min assist verbal cues for functional problem solving.  SLP Short Term Goal 3 (Week 2): Patient will attend to left upper extremity and left of the environment during familiar tasks with supervision verbal cues SLP Short Term Goal 4 (Week 2): Patient will alternate attention for 5 minutes with Min assist verbal cues for redirection  SLP Short Term Goal 5 (Week 2): Patient will utilize external aids to assist with recall of new information with supervision verbal cues   Skilled Therapeutic Interventions: Pt seen for skilled SLP services targeting medication management and safety at home. Pt was able to verbalize that he has difficulty with walking, but had difficulty expanding that to apply to other activities, such as the pt thought he could still ride his bike. Mod A for reasoning applying to safety awareness. Pt reports his girlfriend will be helping him with is meds moving forward. Pt able to recall name of new med at mod I. Discussed using a clock with am/pm designator to help with temporal orientation.    Function:  Eating Eating                 Cognition Comprehension Comprehension assist level: Follows basic conversation/direction with no assist  Expression   Expression assist level: Expresses basic 90% of the time/requires cueing < 10% of the time.  Social Interaction Social Interaction assist level: Interacts appropriately 90% of the time - Needs monitoring or encouragement for participation or interaction.  Problem  Solving Problem solving assist level: Solves basic 75 - 89% of the time/requires cueing 10 - 24% of the time  Memory Memory assist level: Recognizes or recalls 75 - 89% of the time/requires cueing 10 - 24% of the time    Pain Pain Assessment Pain Assessment: No/denies pain  Therapy/Group: Individual Therapy  Rocky CraftsKara E Xiomar Crompton MA, CCC-SLP 03/26/2016, 12:03 PM

## 2016-03-26 NOTE — Progress Notes (Signed)
[place] PHYSICAL MEDICINE & REHABILITATION     PROGRESS NOTE  Subjective/Complaints:  Pt sitting at the edge of the bed eating breakfast.  He states he slept better overnight.  He requests artificial tears.  ROS: Denies CP, SOB, nausea, vomiting, diarrhea.   Objective: Vital Signs: Blood pressure 116/71, pulse 66, temperature 98.3 F (36.8 C), temperature source Oral, resp. rate 16, height 6\' 1"  (1.854 m), weight 85.5 kg (188 lb 7.9 oz), SpO2 100 %. No results found.  Recent Labs  03/26/16 0533  WBC 7.4  HGB 9.3*  HCT 30.2*  PLT 470*    Recent Labs  03/26/16 0533  NA 136  K 4.1  CL 103  GLUCOSE 130*  BUN 12  CREATININE 0.69  CALCIUM 9.2   CBG (last 3)   Recent Labs  03/25/16 1702 03/25/16 2103 03/26/16 0646  GLUCAP 121* 204* 120*    Wt Readings from Last 3 Encounters:  03/26/16 85.5 kg (188 lb 7.9 oz)  03/11/16 85.1 kg (187 lb 9.8 oz)  04/12/15 92.987 kg (205 lb)    Physical Exam:  BP 116/71 mmHg  Pulse 66  Temp(Src) 98.3 F (36.8 C) (Oral)  Resp 16  Ht 6\' 1"  (1.854 m)  Wt 85.5 kg (188 lb 7.9 oz)  BMI 24.87 kg/m2  SpO2 100% Constitutional: He appears well-developed and well-nourished.  HENT: Normocephalic and atraumatic.  Eyes: EOM are normal. Right eye exhibits no discharge. Left eye exhibits no discharge.  Cardiovascular: Normal rate and regular rhythm.  Respiratory: Effort normal and breath sounds normal. No respiratory distress.  GI: Soft. Bowel sounds are normal. He exhibits no distension.  Musculoskeletal: He exhibits no edema or tenderness.  Neurological: He is alert and oriented 3.  Mood is flat but appropriate. Limited insight and awareness  Motor: Right upper extremity/right lower extremity: 5/5 proximal to distal Left upper extremity: 4+/5 delt, bicep, wrist, tricep, HI with motor apraxia (improving) Left lower extremity: 4+/5 HF, KE, 4+/5 ADF/PF with apraxia(improving) Skin: Skin is warm and dry.  Psychiatric: He has a  normal mood and affect. His behavior is normal  Assessment/Plan: 1. Functional deficits secondary to right MCA territory infarct as well as history of CVA with residual weakness which require 3+ hours per day of interdisciplinary therapy in a comprehensive inpatient rehab setting. Physiatrist is providing close team supervision and 24 hour management of active medical problems listed below. Physiatrist and rehab team continue to assess barriers to discharge/monitor patient progress toward functional and medical goals.  Function:  Bathing Bathing position Bathing activity did not occur: Refused Position: Systems developerhower  Bathing parts Body parts bathed by patient: Right arm, Left arm, Chest, Abdomen, Front perineal area, Buttocks, Right upper leg, Left upper leg, Right lower leg, Left lower leg Body parts bathed by helper: Back  Bathing assist Assist Level: Touching or steadying assistance(Pt > 75%)      Upper Body Dressing/Undressing Upper body dressing   What is the patient wearing?: Pull over shirt/dress     Pull over shirt/dress - Perfomed by patient: Thread/unthread right sleeve, Put head through opening, Thread/unthread left sleeve, Pull shirt over trunk Pull over shirt/dress - Perfomed by helper: Thread/unthread left sleeve, Pull shirt over trunk        Upper body assist Assist Level: Set up   Set up : To obtain clothing/put away  Lower Body Dressing/Undressing Lower body dressing   What is the patient wearing?: Underwear, Pants, Non-skid slipper socks Underwear - Performed by patient: Thread/unthread right underwear  leg, Pull underwear up/down Underwear - Performed by helper: Thread/unthread left underwear leg Pants- Performed by patient: Thread/unthread right pants leg, Pull pants up/down Pants- Performed by helper: Thread/unthread left pants leg Non-skid slipper socks- Performed by patient: Don/doff right sock Non-skid slipper socks- Performed by helper: Don/doff left  sock Socks - Performed by patient: Don/doff right sock, Don/doff left sock   Shoes - Performed by patient: Don/doff right shoe, Fasten right, Fasten left Shoes - Performed by helper: Don/doff left shoe   AFO - Performed by helper: Don/doff left AFO      Lower body assist Assist for lower body dressing: Touching or steadying assistance (Pt > 75%)      Toileting Toileting Toileting activity did not occur: N/A Toileting steps completed by patient: Adjust clothing prior to toileting, Performs perineal hygiene Toileting steps completed by helper: Adjust clothing after toileting, Performs perineal hygiene Toileting Assistive Devices: Grab bar or rail  Toileting assist Assist level: Touching or steadying assistance (Pt.75%)   Transfers Chair/bed transfer   Chair/bed transfer method: Stand pivot Chair/bed transfer assist level: Supervision or verbal cues Chair/bed transfer assistive device: Armrests, Biochemist, clinicalWalker Mechanical lift: Landscape architecttedy   Locomotion Ambulation     Max distance: 100 ft Assist level: Supervision or verbal cues   Wheelchair   Type: Manual Max wheelchair distance: 75 Assist Level: Moderate assistance (Pt 50 - 74%)  Cognition Comprehension Comprehension assist level: Follows basic conversation/direction with no assist  Expression Expression assist level: Expresses basic 90% of the time/requires cueing < 10% of the time.  Social Interaction Social Interaction assist level: Interacts appropriately 90% of the time - Needs monitoring or encouragement for participation or interaction.  Problem Solving Problem solving assist level: Solves basic 50 - 74% of the time/requires cueing 25 - 49% of the time  Memory Memory assist level: Recognizes or recalls 75 - 89% of the time/requires cueing 10 - 24% of the time    Medical Problem List and Plan: 1. Altered mental status with left sided weakness secondary to right MCA territory infarct as well as history of CVA September 2013 with  residual left-sided weakness -Cont CIR therapies 2. DVT Prophylaxis/Anticoagulation: SCDs. Monitor for any signs of DVT 3. Pain Management: Tylenol as needed 4. Mood: Buspar 5 mg twice a day 5. Neuropsych: This patient is capable of making decisions on his own behalf. 6. Skin/Wound Care: Routine skin checks 7. Fluids/Electrolytes/Nutrition: Routine I&O   Diet advanced to a heart healthy: Modified /23. Pain 8. Upper GI bleed. Follow-up per gastroenterology. Continue Protonix  Hemoglobin 9.3 on 7/4 (stable). 9. Ischemic cardiac heart disease. No chest pain or shortness of breath. Aspirin 81 mg daily. No Plavix at this time due to GI bleed 10. Hypertension. Corgard 40 mg daily. Follow with increased mobility 11. Hyponatremia. 136 on 7/4 (stable) 12. Diabetes mellitus. Hemoglobin A1c 5.6.   Lantus insulin 5 units daily, increased to 10u on 7/1  Metformin 500 daily started on 6/30, increased to BID on 7/4  -Will cont to monitor 13. Pancreatitis. Continue Creon 24,000 units 3 times a day 14. Alcohol tobacco abuse. Nicoderm patch. Provide counseling 15. Klebsiella UTI. Completed course of Ceftin. 16.  Sleep Disturbance- trazodone increased to 100mg  on 6/27 with improvement  - avoid benzos with hx of ETOH abuse 17. Dry eyes  Artificial tears ordered    LOS (Days) 14 A FACE TO FACE EVALUATION WAS PERFORMED  Andrue Dini Karis Jubanil Vianna Venezia 03/26/2016 8:24 AM

## 2016-03-26 NOTE — Progress Notes (Signed)
Occupational Therapy Session Note  Patient Details  Name: Brandon [name] MRN: [id number] Date of Birth: Jul 21, 1966  Today's Date: 03/26/2016 OT Individual Time: 1300-1358 OT Individual Time Calculation (min): 58 min    Short Term Goals: Week 2:  OT Short Term Goal 1 (Week 2): STGs = LTGs  Skilled Therapeutic Interventions/Progress Updates:    Upon entering the room, pt seated in wheelchair with no c/o pain this session. OT assisted pt via wheelchair outside for time management. Pt required min verbal cues for safety awareness to lock wheelchair breaks before standing. Sit <> stand with supervision from wheelchair. Pt ambulated on uneven surface with RW and close supervision - steady assist. Pt needing verbal cues to scan environment in order to prepare for obstacles ahead. Pt verbalizing urgent need to use bathroom. OT propelled pt into public restroom where he performed stand pivot transfer from wheelchair with use of grab bars and min A to toilet. Pt performed hygiene and needed assist to pull pants back over hips. Pt transferred back into wheelchair and OT assisted back to room. Pt required min cues and increased time in order to navigate back to room. Pt remained in wheelchair with quick release belt donned and call bell within reach.  Therapy Documentation Precautions:  Precautions Precautions: Fall Precaution Comments:  (L hemiplegia) Restrictions Weight Bearing Restrictions: No General:   Vital Signs: Therapy Vitals Temp: 97.7 F (36.5 C) Temp Source: Oral Pulse Rate: 75 Resp: 18 BP: 106/73 mmHg Oxygen Therapy SpO2: 100 % O2 Device: Not Delivered  See Function Navigator for Current Functional Status.   Therapy/Group: Individual Therapy  Lowella Gripittman, Albena Comes L 03/26/2016, 4:31 PM

## 2016-03-26 NOTE — Progress Notes (Signed)
Social Work Patient ID: [name], male   DOB: 1966-01-28, 50 y.o.   MRN: [id number] Have made several attempts to contact [name] to try to schedule family education with prior to discharge from rehab. Pt reports the nephew that just moved in has overdosed and died over the weekend. [facility]. [name] had verbalized she is a CNA and knows how to provide care to pt since she had already after his other strokes. Would still like to get her in here for education.

## 2016-03-26 NOTE — Progress Notes (Addendum)
Physical Therapy Session Note  Patient Details  Name: Marcelle SmilingKeith W Lueth MRN: [id number] Date of Birth: 11-06-1965  Today's Date: 03/26/2016 PT Individual Time: 1030-1100 and [id number] PT Individual Time Calculation (min): 30 min and 40 min  Short Term Goals: Week 2:  PT Short Term Goal 1 (Week 2): STG = LTG due to estimated d/c date.  Skilled Therapeutic Interventions/Progress Updates:    Treatment 1: Pt received in w/c & agreeable to PT, denying c/o pain. Pt able to don R shoe independently but required max A to don L shoe & AFO. Gait training x 100 ft with supervision & RW with PT providing intermittent manual facilitation for improved weight shifting. Pt with difficulty advancing LLE, requiring maximum verbal cuing for sequencing with turns. Pt noted to have incontinent void and ambulated to bathroom where PT provided total A for donning clean brief. Pt able to stand at sink with BUE & supervision to perform grooming tasks, but required cues not to lean on forearms. Pt completed 5x sit<>stand activity with BUE support in 28 seconds. At end of session pt left sitting in w/c with all needs within reach & belts (QRB & Alarm) donned.  Educated pt on family needing to be present for family training & pt voiced understanding.  Treatment 2: Pt received in w/c & agreeable to PT, denying c/o pain. Pt completed sit<>stand with supervision A & gait training x 180 ft + 50 ft + 120 ft. Pt continues to demonstrate significant forward trunk flexion & hip/knee flexion but reports he ambulated in this manner at baseline. Pt with improved L foot clearance and improved ability to stay closer to RW during turns with maximum cuing but poor weight shift to L. Stair training x 8 steps (6") with R ascending/descending rail (pt reports he may not be able to hold B rails at once at home) with supervision. Pt able to recall correct compensatory sequence but unable to demonstrate this without maximum verbal cuing. Pt also with  difficulty clearing LLE when descending steps. In rehab apartment pt able to perform bed mobility (roll L<>R, supine<>sit) with supervision A. Pt unable to sequence need to scoot to center of bed before rolling to edge of bed & required maximum verbal cues to do so. Pt able to perform transfer from low, soft couch in apartment with supervision A. At end of session pt left in w/c in room with all needs within reach, belts (QRB, alarm belt) in place, & set up with meal tray.  Therapy Documentation Precautions:  Precautions Precautions: Fall Precaution Comments:  (L hemiplegia) Restrictions Weight Bearing Restrictions: No  Pain: Pain Assessment Pain Assessment: No/denies pain   See Function Navigator for Current Functional Status.   Therapy/Group: Individual Therapy  Sandi MariscalVictoria M Tia Hieronymus 03/26/2016, 7:55 AM

## 2016-03-27 ENCOUNTER — Inpatient Hospital Stay (HOSPITAL_COMMUNITY): Payer: Medicaid Other | Admitting: Speech Pathology

## 2016-03-27 ENCOUNTER — Inpatient Hospital Stay (HOSPITAL_COMMUNITY): Payer: Medicaid Other | Admitting: Physical Therapy

## 2016-03-27 ENCOUNTER — Inpatient Hospital Stay (HOSPITAL_COMMUNITY): Payer: Medicaid Other | Admitting: Occupational Therapy

## 2016-03-27 LAB — GLUCOSE, CAPILLARY
GLUCOSE-CAPILLARY: 119 mg/dL — AB (ref 65–99)
GLUCOSE-CAPILLARY: 192 mg/dL — AB (ref 65–99)
Glucose-Capillary: 129 mg/dL — ABNORMAL HIGH (ref 65–99)
Glucose-Capillary: 214 mg/dL — ABNORMAL HIGH (ref 65–99)

## 2016-03-27 NOTE — Progress Notes (Signed)
Social Work Patient ID: Brandon [name], male   DOB: 09-Mar-1966, 50 y.o.   MRN: [id number] Spoke with [name] finally to discuss discharge disposition and need for education. She has been busy with nephew's funeral arrangements due to his unexpected passing over the weekend. She plans to be here tomorrow at 11:00 and still plans to take pt home with her on Friday. Will discuss equipment and follow up needs tomorrow. Team aware will be here tomorrow.

## 2016-03-27 NOTE — Progress Notes (Signed)
Physical Therapy Session Note  Patient Details  Name: Brandon [name] MRN: [id number] Date of Birth: [birth year]  Today's Date: 03/27/2016 PT Individual Time: 1350-1433 PT Individual Time Calculation (min): 43 min   Short Term Goals: Week 2:  PT Short Term Goal 1 (Week 2): STG = LTG due to estimated d/c date.  Skilled Therapeutic Interventions/Progress Updates:    Pt received in w/c & agreeable to PT, denying c/o pain. Transported pt down to gym in w/c total A for time management. Adjusted pt's alarm belt for safety as pt is impulsive and at risk for falls. Pt performed berg balance test & scored 28/56 which is a 2 point improvement since previous testing 3 days ago. Patient demonstrates increased fall risk as noted by score of 28/56 on Berg Balance Scale.  (<36= high risk for falls, close to 100%; 37-45 significant >80%; 46-51 moderate >50%; 52-55 lower >25%). Educated pt on Berg Balance Test score & continued fall risk requiring him to use AD & have supervision for all functional mobility at home. Pt with improved ability to perform various berg balance tasks (ex: tapping step) but still demonstrates impaired cognition and inability to follow commands for other tasks. At end of session pt left in w/c in room with QRB & alarm belt in place & all needs within reach.    Therapy Documentation Precautions:  Precautions Precautions: Fall Precaution Comments:  (L hemiplegia) Restrictions Weight Bearing Restrictions: No  Pain: Pain Assessment Pain Assessment: No/denies pain   Balance: Balance Balance Assessed: Yes Standardized Balance Assessment Standardized Balance Assessment: Berg Balance Test Berg Balance Test Sit to Stand: Able to stand  independently using hands Standing Unsupported: Able to stand 2 minutes with supervision Sitting with Back Unsupported but Feet Supported on Floor or Stool: Able to sit safely and securely 2 minutes Stand to Sit: Controls descent by using  hands Transfers: Able to transfer with verbal cueing and /or supervision Standing Unsupported with Eyes Closed: Able to stand 10 seconds with supervision Standing Ubsupported with Feet Together: Able to place feet together independently and stand for 1 minute with supervision From Standing, Reach Forward with Outstretched Arm: Reaches forward but needs supervision From Standing Position, Pick up Object from Floor: Able to pick up shoe, needs supervision From Standing Position, Turn to Look Behind Over each Shoulder: Needs supervision when turning (does not shift weight well to L and only turns head to right) Turn 360 Degrees: Needs assistance while turning Standing Unsupported, Alternately Place Feet on Step/Stool: Able to complete >2 steps/needs minimal assist (able to complete 4 taps (2 on each foot) with Min A for balance) Standing Unsupported, One Foot in Front: Loses balance while stepping or standing (unable to cognitively sequence to take step with one foot & hold position) Standing on One Leg: Tries to lift leg/unable to hold 3 seconds but remains standing independently Total Score: 28    See Function Navigator for Current Functional Status.   Therapy/Group: Individual Therapy  Sandi MariscalVictoria M Vuk Skillern 03/27/2016, 2:23 PM

## 2016-03-27 NOTE — Progress Notes (Signed)
Speech Language Pathology Daily Session Note  Patient Details  Name: Brandon [name] MRN: [id number] Date of Birth: December 23, 1965  Today's Date: 03/27/2016 SLP Individual Time: 0900-1000 SLP Individual Time Calculation (min): 60 min  Short Term Goals: Week 2: SLP Short Term Goal 1 (Week 2): Pt will recognize and correct errors in the moment during basic tasks with min assist verbal cues  SLP Short Term Goal 2 (Week 2): Pt will complete semi-complex tasks with min assist verbal cues for functional problem solving.  SLP Short Term Goal 3 (Week 2): Patient will attend to left upper extremity and left of the environment during familiar tasks with supervision verbal cues SLP Short Term Goal 4 (Week 2): Patient will alternate attention for 5 minutes with Min assist verbal cues for redirection  SLP Short Term Goal 5 (Week 2): Patient will utilize external aids to assist with recall of new information with supervision verbal cues   Skilled Therapeutic Interventions: Skilled treatment session focused on addressing cognition goals. SLP facilitated session by providing Mod assist multimodal cues to recognize and correct errors with a money sorting and change making task.  SLP also facilitated session with Mod verbal cues to attend to left upper extremity during a functional problem solving task.  Continue with current plan of care.    Function:  Cognition Comprehension Comprehension assist level: Follows basic conversation/direction with extra time/assistive device  Expression   Expression assist level: Expresses basic needs/ideas: With no assist  Social Interaction    Problem Solving Problem solving assist level: Solves basic 75 - 89% of the time/requires cueing 10 - 24% of the time  Memory Memory assist level: Recognizes or recalls 75 - 89% of the time/requires cueing 10 - 24% of the time    Pain Pain Assessment Pain Assessment: 0-10 Pain Score: 7  Pain Type: Acute pain Pain Location:  Back Pain Orientation: Lower Pain Descriptors / Indicators: Aching Pain Onset: Gradual Patients Stated Pain Goal: 4 Pain Intervention(s): Other (Comment) (declined medication at this time ) Multiple Pain Sites: No  Therapy/Group: Individual Therapy  Charlane FerrettiMelissa Carlyne Keehan, M.A., CCC-SLP [phone]  [name] 03/27/2016, 12:26 PM

## 2016-03-27 NOTE — Progress Notes (Signed)
Occupational Therapy Session Note  Patient Details  Name: Marcelle SmilingKeith W Gosling MRN: [id number] Date of Birth: 07-23-1966  Today's Date: 03/27/2016 OT Individual Time: 0800-0903 OT Individual Time Calculation (min): 63 min    Short Term Goals: Week 2:  OT Short Term Goal 1 (Week 2): STGs = LTGs  Skilled Therapeutic Interventions/Progress Updates:    Pt completed shower and dressing during session.  He was able to ambulate with the RW to the shower and around the room with min steady assist and min instructional cueing to stay inside of the walker.  Bathing sit to stand with min guard assist.  He demonstrates decreased sustained attention to task, being distracted easily with his on conversation.  He required mod demonstrational cueing for sequencing through bath as he did not attempt washing his LEs this session and needed cueing to remember to wash his bottom.  Decreased ability to problem solve donning clothing over the LLE as he could not figure out how to lift the LLE off of the ground while reaching down.  Min assist with mod demonstrational cueing to have him use the LUE to help with lifting the left foot off of the ground so he could dress it.  Max assist for left shoe and AFO however.  Pt left in wheelchair at bedside with safety belts in place.  Call button and SLP present at end of session.   Therapy Documentation Precautions:  Precautions Precautions: Fall Precaution Comments:  (L hemiplegia) Restrictions Weight Bearing Restrictions: No  Pain: Pain Assessment Pain Assessment: No/denies pain ADL: See Function Navigator for Current Functional Status.   Therapy/Group: Individual Therapy  Tidus Upchurch OTR/L 03/27/2016, 12:16 PM

## 2016-03-27 NOTE — Patient Care Conference (Addendum)
Inpatient RehabilitationTeam Conference and Plan of Care Update Date: 03/28/2016   Time: 11:00 AM    Patient Name: Brandon [name]      Medical Record Number: [id number]  Date of Birth: 24-Oct-1965 Sex: Male         Room/Bed: 4M09C/4M09C-01 Payor Info: Payor: MEDICAID [place] / Plan: MEDICAID [place] ACCESS / Product Type: *No Product type* /    Admitting Diagnosis: [facility] CVA  Admit Date/Time:  03/12/2016  6:15 PM Admission Comments: No comment available   Primary Diagnosis:  Right middle cerebral artery stroke [facility](HCC) Principal Problem: Right middle cerebral artery stroke [facility](HCC)  Patient Active Problem List   Diagnosis Date Noted  . Dry eyes   . Sleep disturbance   . Essential hypertension, benign   . Upper GI bleed   . 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  . Fatty liver   . Tobacco abuse   . Diabetes mellitus type 2 in nonobese (HCC)   . History of CVA with residual deficit   . Acute lower UTI   . Tachycardia   . Chronic alcoholic pancreatitis (HCC)   . Hyponatremia 03/09/2016  . 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  . Acute blood loss anemia 03/09/2016  . Gastric varices   . Alcohol abuse   . Left-sided neglect   . Severe anemia   . UGIB (upper gastrointestinal bleed)   . Pressure ulcer 03/06/2016  . Acute encephalopathy   . CVA (cerebral infarction) 03/05/2016  . Carotid stenosis 04/06/2015  . CAD in native artery 03/16/2015  . Unstable angina pectoris (HCC) 03/15/2015  . Neck pain 10/20/2013  . Insomnia 12/29/2012  . 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 Occlusion and stenosis of carotid artery with cerebral infarction 12/11/2012  . Unspecified cerebral artery occlusion with cerebral infarction 12/11/2012  . Fatigue 11/26/2012  . Hallux valgus 06/25/2012  . Preventative health care 06/25/2012  . Alcohol Dependence 06/18/2012  . Smoking 06/16/2012  . Bilateral extracranial carotid artery stenosis   .  Coronary Artery Disease 06/13/2012  . Ischemic Stroke 06/13/2012  . Hyperlipidemia   . Hypertension 02/25/2011  . GERD (gastroesophageal reflux disease) 02/25/2011  . Chronic Pancreatitis. 02/25/2011  . Hepatic steatosis 02/25/2011    Expected Discharge Date: Expected Discharge Date: 03/29/16  Team Members Present: Physician leading conference: Dr. Maryla MorrowAnkit Patel Social Worker Present: Dossie DerBecky Arryn Terrones, LCSW Nurse Present: Chana Bodeeborah Sharp, RN PT Present: Grier RocherAustin Tucker, PT;Victoria Hyacinth MeekerMiller, PT OT Present: Roney MansJennifer Smith, OT SLP Present: Fae PippinMelissa Bowie, SLP PPS Coordinator present : Tora DuckMarie Noel, RN, CRRN     Current Status/Progress Goal Weekly Team Focus  Medical   Altered mental status with left sided weakness secondary to right MCA territory infarct as well as history of CVA September 2013 with residual left-sided weakness  Safety, cognition, mobility, recall, CBGs  See above   Bowel/Bladder   LBM 6/30 Pt can be incontinent at times. urinal to void Mel Almond/wears a brief  continence  cont. to monitor q shift   Swallow/Nutrition/ Hydration   carb mod diet          ADL's   supervision to steady A at times   overall supervision  family training, left attention, functional use of left UE, activity tolerance, attention to tasks   Mobility   supervision/steady A for transfers, supervision/steady A for ambulation with RW & L AFO limited by R hip pain, steady A/supervision for standing balance with LRAD, L neglect  supervision for ambulation with LRAD, supervision  for stair negotiation with B rails, supervision standing balance with LRAD  posture correction, weight shifting/bearing through LLE, attention to L, gait training, stair training   Communication             Safety/Cognition/ Behavioral Observations  Mod-Max assist with basic   supervision  continue to address left attention, sustianed attention, and safety with basic problem solving   Pain   no pain  less than 2  Monitor q shift   Skin    scabs to bilateral knees  no new skin breakdown  monitor q shift    Rehab Goals Patient on target to meet rehab goals: No *See Care Plan and progress notes for long and short-term goals.  Barriers to Discharge: DM, hyponatremia, safety, mobility, transfers    Possible Resolutions to Barriers:  optimize DM meds, therapies    Discharge Planning/Teaching Needs:  Uncertainty regarding home versus NHP, need to confirm with [name] and her abilities. Pt aware discharge plan may fall through      Team Discussion:  Making progess more aware and not trying to get up on his own. Has more trouble when toileting due to multiple tasks to process. MD adjusting diabetes medications, sleeping better. Poor safety awareness and left inattention. Urgency wears depends.  Pt plans to go home with Marchelle [facility]manda. [facility] to [facility].  Revisions to Treatment Plan:  None   Continued Need for Acute Rehabilitation Level of Care: The patient requires daily medical management by a physician with specialized training in physical medicine and rehabilitation for the following conditions: Daily direction of a multidisciplinary physical rehabilitation program to ensure safe treatment while eliciting the highest outcome that is of practical value to the patient.: Yes Daily medical management of patient stability for increased activity during participation in an intensive rehabilitation regime.: Yes Daily analysis of laboratory values and/or radiology reports with any subsequent need for medication adjustment of medical intervention for : Neurological problems;Diabetes problems;Mood/behavior problems  [name], [name] 03/28/2016, 8:38 AM

## 2016-03-27 NOTE — Progress Notes (Signed)
Physical Therapy Session Note  Patient Details  Name: Brandon [name] MRN: [id number] Date of Birth: 01/19/1966  Today's Date: 03/27/2016 PT Individual Time: 1032-1102 PT Individual Time Calculation (min): 30 min   Short Term Goals: Week 1:  PT Short Term Goal 1 (Week 1): Pt will complete stand/squat pivot transfers & sit<>stand transfers with Min A.   PT Short Term Goal 1 - Progress (Week 1): Met PT Short Term Goal 2 (Week 1): Pt will ambulate with LRAD x 50 ft with Min A. PT Short Term Goal 2 - Progress (Week 1): Met PT Short Term Goal 3 (Week 1): Pt will complete bed mobility with Steady A. PT Short Term Goal 3 - Progress (Week 1): Met Week 2:  PT Short Term Goal 1 (Week 2): STG = LTG due to estimated d/c date.  Skilled Therapeutic Interventions/Progress Updates:    Patient received sitting in [facility] and agreeable to PT. PT transported patient to rehab gym with total A for time management.   PT instructed patient in Gait training with RW for 183f with supervision A and mod cues for improves step height and length on the LLE, cues also provided for improved posture. Patient demonstrated improved step length following cues, but unable to sustain for >15 at a time. Only slight change in posture noted with verbal cues from PT.  Gait training with Rollator, which patient states he plans to use at home, for 40 ft with close supervision A and min cues for improved step length. Patient noted to have step through gait pattern and decreased instances of toe drag with gait training with Rollator.  PT also instructed patient in dynamic gait training with RW around 8 cones x 2 with mod cues for improved positioning to maintain COM with RW to prevent LOB.  [facility] in place and call bell within reach.     Therapy Documentation Precautions:  Precautions Precautions: Fall Precaution Comments:  (L hemiplegia) Restrictions Weight Bearing  Restrictions: No      See Function Navigator for Current Functional Status.   Therapy/Group: Individual Therapy  ALorie Phenix7/01/2016, 5:58 PM

## 2016-03-27 NOTE — Progress Notes (Signed)
[place] PHYSICAL MEDICINE & REHABILITATION     PROGRESS NOTE  Subjective/Complaints:  Pt seen working with OT.  He slept well overnight.    ROS: Denies CP, SOB, nausea, vomiting, diarrhea.   Objective: Vital Signs: Blood pressure 118/61, pulse 62, temperature 97.4 F (36.3 C), temperature source Oral, resp. rate 16, height 6\' 1"  (1.854 m), weight 85 kg (187 lb 6.3 oz), SpO2 97 %. No results found.  Recent Labs  03/26/16 0533  WBC 7.4  HGB 9.3*  HCT 30.2*  PLT 470*    Recent Labs  03/26/16 0533  NA 136  K 4.1  CL 103  GLUCOSE 130*  BUN 12  CREATININE 0.69  CALCIUM 9.2   CBG (last 3)   Recent Labs  03/26/16 1659 03/26/16 2043 03/27/16 0631  GLUCAP 120* 213* 129*    Wt Readings from Last 3 Encounters:  03/27/16 85 kg (187 lb 6.3 oz)  03/11/16 85.1 kg (187 lb 9.8 oz)  04/12/15 92.987 kg (205 lb)    Physical Exam:  BP 118/61 mmHg  Pulse 62  Temp(Src) 97.4 F (36.3 C) (Oral)  Resp 16  Ht 6\' 1"  (1.854 m)  Wt 85 kg (187 lb 6.3 oz)  BMI 24.73 kg/m2  SpO2 97% Constitutional: He appears well-developed and well-nourished.  HENT: Normocephalic and atraumatic.  Eyes: EOM are normal. Right eye exhibits no discharge. Left eye exhibits no discharge.  Cardiovascular: Normal rate and regular rhythm.  Respiratory: Effort normal and breath sounds normal. No respiratory distress.  GI: Soft. Bowel sounds are normal. He exhibits no distension.  Musculoskeletal: He exhibits no edema or tenderness.  Neurological: He is alert and oriented 3.  Limited insight and awareness  Motor: Right upper extremity/right lower extremity: 5/5 proximal to distal Left upper extremity: 4+/5 delt, bicep, wrist, tricep, HI with motor apraxia (improving) Left lower extremity: 4+/5 HF, KE, 4+/5 ADF/PF with apraxia(improving) Skin: Skin is warm and dry.  Psychiatric: He has flat mood and affect. His behavior is normal  Assessment/Plan: 1. Functional deficits secondary to right  MCA territory infarct as well as history of CVA with residual weakness which require 3+ hours per day of interdisciplinary therapy in a comprehensive inpatient rehab setting. Physiatrist is providing close team supervision and 24 hour management of active medical problems listed below. Physiatrist and rehab team continue to assess barriers to discharge/monitor patient progress toward functional and medical goals.  Function:  Bathing Bathing position Bathing activity did not occur: Refused Position: Systems developerhower  Bathing parts Body parts bathed by patient: Right arm, Left arm, Chest, Abdomen, Front perineal area, Buttocks, Right upper leg, Left upper leg, Right lower leg, Left lower leg Body parts bathed by helper: Back  Bathing assist Assist Level: Touching or steadying assistance(Pt > 75%)      Upper Body Dressing/Undressing Upper body dressing   What is the patient wearing?: Pull over shirt/dress     Pull over shirt/dress - Perfomed by patient: Thread/unthread right sleeve, Put head through opening, Thread/unthread left sleeve, Pull shirt over trunk Pull over shirt/dress - Perfomed by helper: Thread/unthread left sleeve, Pull shirt over trunk        Upper body assist Assist Level: Set up   Set up : To obtain clothing/put away  Lower Body Dressing/Undressing Lower body dressing   What is the patient wearing?: Underwear, Pants, Non-skid slipper socks Underwear - Performed by patient: Thread/unthread right underwear leg, Pull underwear up/down Underwear - Performed by helper: Thread/unthread left underwear leg Pants- Performed by  patient: Thread/unthread right pants leg, Pull pants up/down Pants- Performed by helper: Thread/unthread left pants leg Non-skid slipper socks- Performed by patient: Don/doff right sock Non-skid slipper socks- Performed by helper: Don/doff left sock Socks - Performed by patient: Don/doff right sock, Don/doff left sock   Shoes - Performed by patient: Don/doff  right shoe, Fasten right, Fasten left Shoes - Performed by helper: Don/doff left shoe   AFO - Performed by helper: Don/doff left AFO      Lower body assist Assist for lower body dressing: Touching or steadying assistance (Pt > 75%)      Toileting Toileting Toileting activity did not occur: N/A Toileting steps completed by patient: Adjust clothing prior to toileting, Performs perineal hygiene Toileting steps completed by helper: Adjust clothing prior to toileting Toileting Assistive Devices: Grab bar or rail  Toileting assist Assist level: Touching or steadying assistance (Pt.75%)   Transfers Chair/bed transfer   Chair/bed transfer method: Stand pivot Chair/bed transfer assist level: Supervision or verbal cues Chair/bed transfer assistive device: Armrests, Biochemist, clinicalWalker Mechanical lift: Landscape architecttedy   Locomotion Ambulation     Max distance: 180 ft Assist level: Supervision or verbal cues   Wheelchair   Type: Manual Max wheelchair distance: 75 Assist Level: Moderate assistance (Pt 50 - 74%)  Cognition Comprehension Comprehension assist level: Follows complex conversation/direction with no assist  Expression Expression assist level: Expresses complex 90% of the time/cues < 10% of the time  Social Interaction Social Interaction assist level: Interacts appropriately 90% of the time - Needs monitoring or encouragement for participation or interaction.  Problem Solving Problem solving assist level: Solves basic 75 - 89% of the time/requires cueing 10 - 24% of the time  Memory Memory assist level: Recognizes or recalls 75 - 89% of the time/requires cueing 10 - 24% of the time    Medical Problem List and Plan: 1. Altered mental status with left sided weakness secondary to right MCA territory infarct as well as history of CVA September 2013 with residual left-sided weakness -Cont CIR therapies 2. DVT Prophylaxis/Anticoagulation: SCDs. Monitor for any signs of DVT 3. Pain  Management: Tylenol as needed 4. Mood: Buspar 5 mg twice a day 5. Neuropsych: This patient is capable of making decisions on his own behalf. 6. Skin/Wound Care: Routine skin checks 7. Fluids/Electrolytes/Nutrition: Routine I&O   Diet advanced to a heart healthy: Modified /23. Pain 8. Upper GI bleed. Follow-up per gastroenterology. Continue Protonix  Hemoglobin 9.3 on 7/4 (stable). 9. Ischemic cardiac heart disease. No chest pain or shortness of breath. Aspirin 81 mg daily. No Plavix at this time due to GI bleed 10. Hypertension. Corgard 40 mg daily. Follow with increased mobility 11. Hyponatremia. 136 on 7/4 (stable) 12. Diabetes mellitus. Hemoglobin A1c 5.6.   Lantus insulin 5 units daily, increased to 10u on 7/1  Metformin 500 daily started on 6/30, increased to BID on 7/4  Will cont to monitor 13. Pancreatitis. Continue Creon 24,000 units 3 times a day 14. Alcohol tobacco abuse. Nicoderm patch. Provide counseling 15. Klebsiella UTI. Completed course of Ceftin. 16.  Sleep Disturbance- trazodone increased to 100mg  on 6/27 with improvement  - avoid benzos with hx of ETOH abuse 17. Dry eyes  Artificial tears ordered    LOS (Days) 15 A FACE TO FACE EVALUATION WAS PERFORMED  Danayah Smyre Karis Jubanil Elyjah Hazan 03/27/2016 9:11 AM

## 2016-03-28 ENCOUNTER — Ambulatory Visit (HOSPITAL_COMMUNITY): Payer: Medicaid Other | Admitting: Physical Therapy

## 2016-03-28 ENCOUNTER — Encounter (HOSPITAL_COMMUNITY): Payer: Medicaid Other | Admitting: Speech Pathology

## 2016-03-28 ENCOUNTER — Inpatient Hospital Stay (HOSPITAL_COMMUNITY): Payer: Medicaid Other | Admitting: Occupational Therapy

## 2016-03-28 ENCOUNTER — Encounter (HOSPITAL_COMMUNITY): Payer: Medicaid Other | Admitting: Occupational Therapy

## 2016-03-28 LAB — GLUCOSE, CAPILLARY
GLUCOSE-CAPILLARY: 135 mg/dL — AB (ref 65–99)
GLUCOSE-CAPILLARY: 110 mg/dL — AB (ref 65–99)
Glucose-Capillary: 148 mg/dL — ABNORMAL HIGH (ref 65–99)
Glucose-Capillary: 121 mg/dL — ABNORMAL HIGH (ref 65–99)

## 2016-03-28 MED ORDER — METFORMIN HCL 500 MG PO TABS
1000.0000 mg | ORAL_TABLET | Freq: Two times a day (BID) | ORAL | Status: DC
  Administered 2016-03-28 – 2016-03-29 (×3): 1000 mg via ORAL
  Filled 2016-03-28 (×3): qty 2

## 2016-03-28 NOTE — Progress Notes (Signed)
[place] PHYSICAL MEDICINE & REHABILITATION     PROGRESS NOTE  Subjective/Complaints:  Pt laying in bed this AM.  He is sleepy and states he had a "rough night" because he was "tossing and turning".   ROS: Denies CP, SOB, nausea, vomiting, diarrhea.   Objective: Vital Signs: Blood pressure 123/71, pulse 71, temperature 98.5 F (36.9 C), temperature source Oral, resp. rate 17, height 6\' 1"  (1.854 m), weight 85.4 kg (188 lb 4.4 oz), SpO2 97 %. No results found.  Recent Labs  03/26/16 0533  WBC 7.4  HGB 9.3*  HCT 30.2*  PLT 470*    Recent Labs  03/26/16 0533  NA 136  K 4.1  CL 103  GLUCOSE 130*  BUN 12  CREATININE 0.69  CALCIUM 9.2   CBG (last 3)   Recent Labs  03/27/16 1647 03/27/16 2126 03/28/16 0650  GLUCAP 192* 214* 135*    Wt Readings from Last 3 Encounters:  03/28/16 85.4 kg (188 lb 4.4 oz)  03/11/16 85.1 kg (187 lb 9.8 oz)  04/12/15 92.987 kg (205 lb)    Physical Exam:  BP 123/71 mmHg  Pulse 71  Temp(Src) 98.5 F (36.9 C) (Oral)  Resp 17  Ht 6\' 1"  (1.854 m)  Wt 85.4 kg (188 lb 4.4 oz)  BMI 24.84 kg/m2  SpO2 97% Constitutional: He appears well-developed and well-nourished.  HENT: Normocephalic and atraumatic.  Eyes: EOM are normal. Right eye exhibits no discharge. Left eye exhibits no discharge.  Cardiovascular: Normal rate and regular rhythm.  Respiratory: Effort normal and breath sounds normal. No respiratory distress.  GI: Soft. Bowel sounds are normal. He exhibits no distension.  Musculoskeletal: He exhibits no edema or tenderness.  Neurological: He is alert and oriented 3.  Motor: Right upper extremity/right lower extremity: 5/5 proximal to distal Left upper extremity: 4+/5 delt, bicep, wrist, tricep, HI with motor apraxia (continues to improve) Left lower extremity: 4+/5 HF, KE, 4+/5 ADF/PF with apraxia(improving) Skin: Skin is warm and dry.  Psychiatric: He has flat mood and affect. His behavior is  normal  Assessment/Plan: 1. Functional deficits secondary to right MCA territory infarct as well as history of CVA with residual weakness which require 3+ hours per day of interdisciplinary therapy in a comprehensive inpatient rehab setting. Physiatrist is providing close team supervision and 24 hour management of active medical problems listed below. Physiatrist and rehab team continue to assess barriers to discharge/monitor patient progress toward functional and medical goals.  Function:  Bathing Bathing position Bathing activity did not occur: Refused Position: Systems developerhower  Bathing parts Body parts bathed by patient: Right arm, Left arm, Chest, Abdomen, Front perineal area, Buttocks, Right upper leg, Left upper leg Body parts bathed by helper: Back  Bathing assist Assist Level: Touching or steadying assistance(Pt > 75%)      Upper Body Dressing/Undressing Upper body dressing   What is the patient wearing?: Pull over shirt/dress     Pull over shirt/dress - Perfomed by patient: Thread/unthread right sleeve, Put head through opening, Thread/unthread left sleeve, Pull shirt over trunk Pull over shirt/dress - Perfomed by helper: Thread/unthread left sleeve, Pull shirt over trunk        Upper body assist Assist Level: Set up   Set up : To obtain clothing/put away  Lower Body Dressing/Undressing Lower body dressing   What is the patient wearing?: Non-skid slipper socks, Pants Underwear - Performed by patient: Thread/unthread right underwear leg, Pull underwear up/down Underwear - Performed by helper: Thread/unthread left underwear leg Pants-  Performed by patient: Thread/unthread right pants leg, Pull pants up/down Pants- Performed by helper: Thread/unthread left pants leg Non-skid slipper socks- Performed by patient: Don/doff right sock, Don/doff left sock Non-skid slipper socks- Performed by helper: Don/doff left sock Socks - Performed by patient: Don/doff right sock, Don/doff left  sock   Shoes - Performed by patient: Don/doff right shoe, Fasten right, Fasten left Shoes - Performed by helper: Don/doff left shoe   AFO - Performed by helper: Don/doff left AFO      Lower body assist Assist for lower body dressing: Touching or steadying assistance (Pt > 75%)      Toileting Toileting Toileting activity did not occur: N/A Toileting steps completed by patient: Adjust clothing prior to toileting, Performs perineal hygiene, Adjust clothing after toileting Toileting steps completed by helper: Adjust clothing prior to toileting Toileting Assistive Devices: Grab bar or rail  Toileting assist Assist level: Touching or steadying assistance (Pt.75%)   Transfers Chair/bed transfer   Chair/bed transfer method: Stand pivot Chair/bed transfer assist level: Supervision or verbal cues Chair/bed transfer assistive device: Environmental consultantWalker, Consulting civil engineerArmrests Mechanical lift: Landscape architecttedy   Locomotion Ambulation     Max distance: 1620ft Assist level: Supervision or verbal cues   Wheelchair   Type: Manual Max wheelchair distance: 75 Assist Level: Moderate assistance (Pt 50 - 74%)  Cognition Comprehension Comprehension assist level: Follows basic conversation/direction with extra time/assistive device  Expression Expression assist level: Expresses basic needs/ideas: With no assist  Social Interaction Social Interaction assist level: Interacts appropriately 90% of the time - Needs monitoring or encouragement for participation or interaction.  Problem Solving Problem solving assist level: Solves basic 75 - 89% of the time/requires cueing 10 - 24% of the time  Memory Memory assist level: Recognizes or recalls 75 - 89% of the time/requires cueing 10 - 24% of the time    Medical Problem List and Plan: 1. Altered mental status with left sided weakness secondary to right MCA territory infarct as well as history of CVA September 2013 with residual left-sided weakness -Cont CIR therapies 2. DVT  Prophylaxis/Anticoagulation: SCDs. Monitor for any signs of DVT 3. Pain Management: Tylenol as needed 4. Mood: Buspar 5 mg twice a day 5. Neuropsych: This patient is capable of making decisions on his own behalf. 6. Skin/Wound Care: Routine skin checks 7. Fluids/Electrolytes/Nutrition: Routine I&O   Diet advanced to a heart healthy: Modified /23. Pain 8. Upper GI bleed. Follow-up per gastroenterology. Continue Protonix  Hemoglobin 9.3 on 7/4 (stable). 9. Ischemic cardiac heart disease. No chest pain or shortness of breath. Aspirin 81 mg daily. No Plavix at this time due to GI bleed 10. Hypertension. Corgard 40 mg daily. Follow with increased mobility 11. Hyponatremia. 136 on 7/4 (stable) 12. Diabetes mellitus. Hemoglobin A1c 5.6.   Lantus insulin 5 units daily, increased to 10u on 7/1  Metformin 500 daily started on 6/30, increased to BID on 7/4, increased to 1000 BID on 7/6  Will cont to monitor 13. Pancreatitis. Continue Creon 24,000 units 3 times a day 14. Alcohol tobacco abuse. Nicoderm patch. Provide counseling 15. Klebsiella UTI. Completed course of Ceftin. 16.  Sleep Disturbance- trazodone increased to 100mg  on 6/27 with improvement  Avoid benzos with hx of ETOH abuse 17. Dry eyes  Artificial tears ordered    LOS (Days) 16 A FACE TO FACE EVALUATION WAS PERFORMED  Ankit Karis Jubanil Patel 03/28/2016 7:49 AM

## 2016-03-28 NOTE — Progress Notes (Signed)
Speech Language Pathology Discharge Summary  Patient Details  Name: Brandon [name] MRN: [id number] Date of Birth: 07/27/1966  Today's Date: 03/28/2016 SLP Individual Time: 1110-1205 SLP Individual Time Calculation (min): 55 min   Skilled Therapeutic Interventions:  Pt was seen for skilled ST targeting family education prior to discharge home tomorrow.  Pt's girlfriend Brandon [name] was present during today's therapy session and remained actively engaged in all training activities.  Pt required supervision verbal cues for initiation and sequencing to sit at edge of bed and donn socks.  Therapist provided skilled education regarding current cognitive limitations and their impact on pt's functional independence in the home environment.  Therapist discussed distraction management techniques and techniques to facilitate left attention and task initiation.  Therapist also provided skilled strategy instruction regarding memory compensatory strategies.  Handouts were provided to maximize carryover in the home environment.  Pt's girlfriend was made aware of team's recommendation for 24/7 supervision at home due to cognitive impairment and was agreeable to providing recommended level of care.  Therapist also discussed recommendation that pt have assistance for medication and financial management at discharge in addition to Brandon [name] follow up at next level of care.  All questions were answered to pt's and girlfriend's satisfaction at this time.  Education is complete and pt is ready for discharge tomorrow.       Patient has met 2 of 4 long term goals.  Patient to discharge at overall Supervision;Min level.  Reasons goals not met:  Pt continues to present with decreased sustained attention to tasks and impaired emergent awareness of deficits.    Clinical Impression/Discharge Summary:  Pt made functional gains while inpatient and has met 2 out of 4 short term goals.  Pt has demonstrated improvement in his functional problem  solving and use of external memory aids to facilitate recall of daily information.  He currently requires anywhere from min assist to supervision assist for basic to semi-complex cognitive tasks due to moderate cognitive impairment characterized by left inattention, poor intellectual awareness of deficits, and impaired task initiation s/p R CVA.  Pt and family education is complete at this time.  Pt is discharging home with 24/7 supervision from girlfriend primarily.  Recommend additional ST follow up at next level of care to continue to address cognitive impairment.    Care Partner:  Caregiver Able to Provide Assistance: Yes  Type of Caregiver Assistance: Physical;Cognitive  Recommendation:  24 hour supervision/assistance;Home Health SLP  Rationale for SLP Follow Up: Maximize cognitive function and independence   Equipment: none recommended by SLP    Reasons for discharge: Discharged from hospital   Patient/Family Agrees with Progress Made and Goals Achieved: Yes   Function:  Eating Eating                 Cognition Comprehension Comprehension assist level: Follows basic conversation/direction with extra time/assistive device  Expression   Expression assist level: Expresses basic needs/ideas: With extra time/assistive device  Social Interaction Social Interaction assist level: Interacts appropriately 90% of the time - Needs monitoring or encouragement for participation or interaction.  Problem Solving Problem solving assist level: Solves basic 75 - 89% of the time/requires cueing 10 - 24% of the time  Memory Memory assist level: Recognizes or recalls 75 - 89% of the time/requires cueing 10 - 24% of the time   Emilio Math 03/28/2016, 12:57 PM

## 2016-03-28 NOTE — Discharge Summary (Signed)
[name]:  Well, Brandon [name]              ACCOUNT NO.:  [id number]  MEDICAL RECORD NO.:  [id number]  LOCATION:  [id number]                        FACILITY:  [facility]  PHYSICIAN:  Maryla MorrowAnkit Dillon Livermore, MD        DATE OF BIRTH:  Oct 10, 1965  DATE OF ADMISSION:  03/12/2016 DATE OF DISCHARGE:  03/29/2016                              DISCHARGE SUMMARY   DISCHARGE DIAGNOSES: 1. Right middle cerebral artery territory infarct. 2. SCDs for deep venous thrombosis prophylaxis. 3. Depression. 4. Upper gastrointestinal bleed. 5. Ischemic cardiac heart disease. 6. Hypertension. 7. Diabetes mellitus. 8. Pancreatitis 9. Alcohol and tobacco abuse 10. Klebsiella urinary tract infection.  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  Lives with his sister.  Reported to be independent prior to admission.  Sister works during the day.  Presented on March 05, 2016, with altered mental status, easily fatigued.  He was found by a friend lying on the couch, confused, covered in feces and urine.  MRI of the brain showed acute infarcts, scattered in the right middle cerebral artery territory.  CT angiogram of the head and neck showed chronic occlusion of right ICA. Left carotid stent was patent.  Echocardiogram with ejection fraction of 55-60%, no wall motion abnormalities.  The patient did not receive tPA. Ultrasound of the abdomen showed no focal lesions or ascites.  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  Findings of hemoglobin 4.8.  He was transfused. Underwent upper GI endoscopy, March 08, 2016, per Dr. 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  Low-dose aspirin was resumed.  He was on a full liquid diet advanced as tolerated.  Urine culture, greater than 100,000 gram-negative rods, placed on  Rocephin, changed to Ceftin x5 days.  Hyponatremia 125-131 and monitored. Physical and occupational therapy ongoing.  The patient was admitted for comprehensive rehab program.  PAST MEDICAL HISTORY:  See discharge diagnoses.  SOCIAL HISTORY:  Per chart review.  Lives with his sister.  He has a girlfriend who can assist, reported to be independent prior to admission.  FUNCTIONAL STATUS:  Upon admission to Rehab Services was moderate assist sit to stand, moderate assist squat pivot transfers, mod-to-max assist activities of daily living.  PHYSICAL EXAMINATION:  VITAL SIGNS:  Blood pressure 134/94, pulse 108, temperature 97.9, respirations 18. GENERAL:  This was an alert male, flat affect, but appropriate.  He was able to provide his name and age.  Poor insight and awareness of deficits. LUNGS:  Decreased breath sounds.  Clear to auscultation. CARDIAC:  Regular rate and rhythm. ABDOMEN:  Soft, nontender.  Good bowel sounds.  REHABILITATION HOSPITAL COURSE:  The patient was admitted to Inpatient Rehab Services with therapies initiated on a 3-hour daily basis, consisting of physical therapy, occupational therapy, speech therapy and rehabilitation nursing.  The following issues were addressed during the patient's rehabilitation stay.  Pertaining to [name]'s right MCA territory infarct, he remained on aspirin therapy.  Plavix had not been resumed due to GI bleed.  SCDs for DVT prophylaxis.  Diet had been advanced to a regular consistency.  Hemoglobin remained stable at 9.3. He remained on Protonix.  He would follow up with Gastroenterology Services.  Hypertension well controlled, on Corgard.  Diabetes mellitus, peripheral neuropathy.  Hemoglobin A1c of 5.6.  He was on low-dose Lantus, insulin as well as Glucophage with full diabetic teaching.  He would follow up with his primary MD.  He had completed a course of Ceftin for urinary tract infection, remaining afebrile.  Noted  long history of alcohol and tobacco abuse.  He was on a NicoDerm patch.  He received full counsel in regard to cessation of nicotine, alcohol products.  It was questionable if he would be compliant with these requests.  The patient received weekly collaborative interdisciplinary team conferences to discuss estimated length of stay, family teaching, any barriers to discharge.  The patient performed Berg balance testing, scoring 28/56, which was a 2-point improvement from initial testing. Ambulate 150 feet supervision, working with dynamic standing balance, energy conservation techniques, can gather belongings for activities of daily living and homemaking, ambulate with walker to shower and around the room.  He did demonstrate some decrease sustained attention to task. Speech Therapy followup.  Followed basic commands, direction with some extra time.  He can express his needs.  Problem solving, 75-89% accuracy as well as memory recall.  It was advised the family need for supervision at discharge for the patient's safety.  DISCHARGE MEDICATIONS: 1. Aspirin 81 mg p.o. daily. 2. BuSpar 5 mg p.o. b.i.d. 3. Folic acid 1 mg p.o. daily. 4. Lantus insulin 10 units subcutaneous daily. 5. Creon 24,000 units t.i.d. 6. Glucophage 500 mg p.o. b.i.d. 7. Corgard 40 mg p.o. daily. 8. NicoDerm patch taper as directed. 9. Protonix 40 mg p.o. daily. 10.Spiriva 18 mcg daily. 11.Ultram 50 mg p.o. every 6 hours as needed for moderate pain.  DIET:  Diabetic diet.  FOLLOWUP:  The patient would follow up with Dr. Maryla MorrowAnkit Eduin Friedel at the Outpatient Rehab Service Office as advised; Dr. Delia HeadyPramod Sethi, 1 month, Neurology appointment; Dr. Adela LankArmbruster, Gastroenterology Services, call for appointment; Dr. Park BreedKahn, medical management.  SPECIAL INSTRUCTIONS:  No driving.  No smoking.  No alcohol.     Mariam Dollaraniel Angiulli, P.A.   ______________________________ Maryla MorrowAnkit Michelina Mexicano, MD    DA/MEDQ  D:  03/28/2016  T:  03/28/2016   Job:  [id number]  cc:   Pramod P. Pearlean BrownieSethi, MD Dr. Maylon PeppersPaul Armbruster Adrian BlackwaterShaukat Khan, MD

## 2016-03-28 NOTE — Progress Notes (Signed)
Social Work Patient ID: Brandon SmilingKeith W [name], male   DOB: [birth year], 50 y.o.   MRN: [id number]  [name] was here to attend therapies with pt and reports it is going well. She is familiar with providing care to stroke patients due to she has done this being a CNA. Discussed equipment needs and both feel he will need a [place]. Will make referrals for equipment and follow up  Home health services. Prepare for discharge tomorrow.

## 2016-03-28 NOTE — Progress Notes (Signed)
Occupational Therapy Session Notes and Discharge Summary  Patient Details  Name: SHILOH SWOPES MRN: [id number] Date of Birth: 04-Jun-1966  Today's Date: 03/28/2016  SESSION NOTES  Session #1 1300-1400 - 60 Minutes Individual Therapy No complaints of pain Grad day! Focusing on family education with significant other/caregiver. Upon entering room, pt found seated in w/c with caregiver, [name] present. Therapist talked with patient and [name] regarding d/c planning and patient's current level of function. [name] and therapist assisted with gathering items for ADL. [name] then assisted pt to tub room. Pt stood with RW and ambulated into BR simulated like at home (no AFO). Pt transferred onto elevated toilet seat, completed toileting, then stood and transferred to tub transfer bench in tub/shower unit. [name] assisted with bathing and dressing and is independent to safely assist patient. Pt ambulated back to w/c and left pt in room waiting for PT for next therapy session.  Session #2 1535-1610 - 35 Minutes Individual Therapy No complaints of pain Upon entering room, pt found seated in w/c with caregiver present. Therapist discussed community integration with patient and caregiver, w/c vs use of RW. Therapist assisted pt to laundry room and simulated laundry task, pt supervision for this. Discussed IADLs at home and encouraged pt to engage in these type tasks with RW and supervision from caregiver. Pt nor caregiver with any further questions. Pt ready for discharge home tomorrow with 24/7 supervision/assistance.   -------------------------------------------------------------------------------------------------------------------------------   DISCHARGE SUMMARY   Patient has met 8 of 9 long term goals due to improved activity tolerance, improved balance, postural control, ability to compensate for deficits, functional use of  LEFT upper extremity, improved attention, improved awareness and improved  coordination.  Patient to discharge at overall to min assist level.  Patient's care partner is independent to provide the necessary physical and cognitive assistance at discharge.    Reasons goals not met: Pt did not meet LB dressing goal of supervision, pt currently requires min assist for LB dressing in order to don right shoe with AFO; pt is supervision for all other LB dressing aspects. Caregiver is independent to assist patient with these tasks.  LUE HEP goal n/a as of 03/25/16.   Recommendation:  Patient will benefit from ongoing skilled OT services in home health setting to continue to advance functional skills in the area of BADL, iADL and Reduce care partner burden.  Equipment: tub transfer bench AND 3-n-1  Reasons for discharge: treatment goals met and discharge from hospital  Patient/family agrees with progress made and goals achieved: Yes  OT Discharge Precautions/Restrictions  Precautions Precautions: Fall Precaution Comments: Lt hemeplegia Restrictions Weight Bearing Restrictions: No   Pain Pain Assessment Pain Assessment: No/denies pain   ADL - See Function Tab  Vision/Perception  Vision- History Baseline Vision/History: Wears glasses Wears Glasses: Reading only Patient Visual Report: No change from baseline Vision- Assessment Vision Assessment?: No apparent visual deficits Praxis Praxis-Other Comments: limb apraxia    Cognition Overall Cognitive Status: Impaired/Different from baseline Arousal/Alertness: Awake/alert Orientation Level: Oriented X4 Attention: Selective Selective Attention: Impaired Selective Attention Impairment: Functional basic;Verbal basic Memory: Impaired Memory Impairment: Retrieval deficit;Decreased recall of new information Awareness: Impaired Awareness Impairment: Intellectual impairment Problem Solving: Impaired Problem Solving Impairment: Functional basic;Verbal basic Executive Function: Initiating;Organizing;Decision  Making;Self Monitoring;Self Correcting Organizing: Impaired Organizing Impairment: Functional basic Decision Making: Impaired Decision Making Impairment: Functional basic;Verbal basic Initiating: Impaired Initiating Impairment: Verbal basic;Functional basic Self Monitoring: Impaired Self Monitoring Impairment: Functional basic Self Correcting: Impaired Self Correcting Impairment: Functional basic Behaviors: Impulsive Safety/Judgment:  Impaired Comments: left inattention    Sensation Sensation Light Touch: Impaired by gross assessment (LUE) Stereognosis: Impaired by gross assessment (LUE) Hot/Cold: Impaired by gross assessment (LUE) Proprioception: Impaired by gross assessment (LUE) Coordination Gross Motor Movements are Fluid and Coordinated: No Fine Motor Movements are Fluid and Coordinated: No Coordination and Movement Description: Difficult with grasp and release   Motor - See PT discharge summary  Mobility - See PT discharge summary  Trunk/Postural Assessment - see PT discharge summary   Balance - see PT discharge summary   Extremity/Trunk Assessment RUE Assessment RUE Assessment: Within Functional Limits LUE Assessment LUE Assessment: Exceptions to WFL LUE AROM (degrees) Overall AROM Left Upper Extremity: Deficits (flexion to ~100 *, decrease strength=3/5 (shoulder), 3+/5 (elbow)) LUE PROM (degrees) Overall PROM Left Upper Extremity: Deficits (stiffnes and increased tone)   See Function Navigator for Current Functional Status.  Chrys Racer , MS, OTR/L, CLT  03/28/2016, 4:24 PM

## 2016-03-28 NOTE — Discharge Summary (Signed)
Charge summary job [phone]

## 2016-03-28 NOTE — Plan of Care (Signed)
Problem: RH Floor Transfers Goal: LTG Patient will perform floor transfers w/assist (PT) LTG: Patient will perform floor transfers with assistance (PT).  Outcome: Not Met (add Reason) Poor motor planning prevents Min A for transfer.

## 2016-03-28 NOTE — Plan of Care (Signed)
Problem: RH Attention Goal: LTG Patient will demonstrate focused/sustained (SLP) LTG: Patient will demonstrate focused/sustained/selective/alternating/divided attention during cognitive/linguistic activities in specific environment with assist for # of minutes (SLP)  Outcome: Not Met (add Reason) Min assist needed to alternate attention between tasks   Problem: RH Awareness Goal: LTG: Patient will demonstrate intellectual/emergent (SLP) LTG: Patient will demonstrate intellectual/emergent/anticipatory awareness with assist during a cognitive/linguistic activity (SLP)  Outcome: Not Met (add Reason) Min assist needed for intellectual awareness of deficits

## 2016-03-28 NOTE — Progress Notes (Signed)
Physical Therapy Discharge Summary  Patient Details  Name: Brandon [name] MRN: [id number] Date of Birth: [birth year]  Today's Date: 03/28/2016 PT Individual Time: 1401-1503 PT Individual Time Calculation (min): 62 min    Patient has met 9 of 10 long term goals due to improved activity tolerance, improved balance, improved postural control, increased strength and increased range of motion.  Patient to discharge at an ambulatory level Supervision.   Patient's care partner is independent to provide the necessary physical and cognitive assistance at discharge.  Reasons goals not met: Continued poor motor planning prevent min A with floor transfer.   Recommendation:  Patient will benefit from ongoing skilled PT services in home health setting to continue to advance safe functional mobility, address ongoing impairments in Balance, gait, coordination, motor planning and minimize fall risk.  Equipment: RW, Wheel chair, WC cushion.   Reasons for discharge: treatment goals met and discharge from hospital  Patient/family agrees with progress made and goals achieved: Yes    Treatment.  Gait training for 119ft, 158ft, 45ft, and 50 ft With RW and supervision A from PT and min cues for improved posture and increased step length/height. Orthotist present for part of gait training to issue [place] walk on AFO and assess fit. Stair training x 12 steps (6") with 1 UE support and supervision-min A and mod cues for improved step to gait pattern tactile cues for only using 1 UE. Care giver completed last 4 steps with patient with intermittent supervsion-min A.  Gait training over uneven surface x 60ft and 55ft for simulation of entrance to house. Min cues for improved step to gait pattern on uneven surface and AD management to prevent LOB.  Car transfer training with supervision A and RW. Min cues for proper UE placement for sit>stand from car and improved AD management with transfer to [facility].  Patient returned  to room and left sitting in [facility] with lap belt in place.   PT Discharge Precautions/Restrictions Precautions Precautions: Fall Precaution Comments: Lt hemeplegia Restrictions Weight Bearing Restrictions: No Vital Signs   Pain Pain Assessment Pain Assessment: No/denies pain Vision/Perception  Praxis Praxis-Other Comments: limb apraxia   Cognition Overall Cognitive Status: Impaired/Different from baseline Orientation Level: Oriented X4 Sustained Attention: Impaired Selective Attention: Impaired Memory: Impaired Awareness: Impaired Awareness Impairment: Anticipatory impairment Problem Solving: Impaired Problem Solving Impairment: Functional complex Sequencing: Impaired Sequencing Impairment: Functional complex Self Monitoring: Impaired Self Monitoring Impairment: Functional complex Safety/Judgment: Impaired Comments: Poor awareness and poor motor planning decrease patient's safety.  Sensation Sensation Light Touch: Impaired by gross assessment (LUE) Stereognosis: Impaired by gross assessment (LUE) Hot/Cold: Impaired by gross assessment (LUE) Proprioception: Impaired by gross assessment (LUE) Coordination Gross Motor Movements are Fluid and Coordinated: No Fine Motor Movements are Fluid and Coordinated: No Coordination and Movement Description: Difficult with grasp and release Motor  Motor Motor: Motor impersistence Motor - Skilled Clinical Observations:  (L side weakness (UE & LE), impaired coordination LLE)  Mobility Bed Mobility Bed Mobility: Rolling Right;Rolling Left;Supine to Sit;Sit to Supine Rolling Right: 5: Supervision Rolling Left: 5: Supervision Supine to Sit: 5: Supervision Sit to Supine: 5: Supervision Transfers Sit to Stand: 5: Supervision Sit to Stand Details: Verbal cues for technique Stand to Sit: 5: Supervision Stand to Sit Details (indicate cue type and reason): Verbal cues for technique Stand Pivot Transfers: 5: Supervision Stand Pivot  Transfer Details: Verbal cues for safe use of DME/AE;Verbal cues for precautions/safety Locomotion  Ambulation Ambulation: Yes Ambulation/Gait Assistance: 5: Supervision Ambulation Distance (Feet): 150 Feet  Assistive device: Rolling walker Ambulation/Gait Assistance Details: Tactile cues for posture;Verbal cues for precautions/safety;Verbal cues for gait pattern Gait Gait: Yes Gait Pattern: Step-to pattern;Step-through pattern;Trunk flexed;Poor foot clearance - left Stairs / Additional Locomotion Stairs: Yes Stairs Assistance: 5: Supervision;4: Min guard Stair Management Technique: One rail Right Number of Stairs: 12 Height of Stairs: 6 Ramp: 5: Supervision  Trunk/Postural Assessment  Cervical Assessment Cervical Assessment: Within Functional Limits Thoracic Assessment Thoracic Assessment: Exceptions to [facility] (increased kyphosis) Lumbar Assessment Lumbar Assessment: Within Functional Limits Postural Control Postural Control: Within Functional Limits  Balance Balance Balance Assessed: Yes Static Sitting Balance Static Sitting - Balance Support: No upper extremity supported Static Sitting - Level of Assistance: 6: Modified independent (Device/Increase time) Dynamic Sitting Balance Sitting balance - Comments: supervision A  Static Standing Balance Static Standing - Balance Support: No upper extremity supported Static Standing - Level of Assistance: 5: Stand by assistance Dynamic Standing Balance Dynamic Standing - Balance Support: Bilateral upper extremity supported Dynamic Standing - Level of Assistance: 5: Stand by assistance;4: Min assist Extremity Assessment  RUE Assessment RUE Assessment: Within Functional Limits LUE Assessment LUE Assessment: Exceptions to WFL LUE AROM (degrees) Overall AROM Left Upper Extremity: Deficits (flexion to ~100 *, decrease strength=3/5 (shoulder), 3+/5 (elbow)) LUE PROM (degrees) Overall PROM Left Upper Extremity: Deficits (stiffnes and  increased tone) RLE Assessment RLE Assessment: Within Functional Limits RLE AROM (degrees) Overall AROM Right Lower Extremity: Within functional limits for tasks assessed RLE Strength Right Hip Flexion: 4/5 Right Knee Flexion: 4/5 Right Knee Extension: 4/5 Right Ankle Dorsiflexion: 4/5 LLE Assessment LLE Assessment: Exceptions to [facility] LLE Strength Left Hip Flexion: 4-/5 Left Knee Flexion: 4-/5 Left Knee Extension: 4-/5 Left Ankle Dorsiflexion: 3-/5   See Function Navigator for Current Functional Status.  Lorie Phenix 03/28/2016, 7:01 PM

## 2016-03-29 LAB — CBC WITH DIFFERENTIAL/PLATELET
BASOS ABS: 0.1 10*3/uL (ref 0.0–0.1)
BASOS PCT: 1 %
Eosinophils Absolute: 0.2 10*3/uL (ref 0.0–0.7)
Eosinophils Relative: 2 %
HEMATOCRIT: 30.3 % — AB (ref 39.0–52.0)
HEMOGLOBIN: 9.6 g/dL — AB (ref 13.0–17.0)
Lymphocytes Relative: 20 %
Lymphs Abs: 1.8 10*3/uL (ref 0.7–4.0)
MCH: 28.4 pg (ref 26.0–34.0)
MCHC: 31.7 g/dL (ref 30.0–36.0)
MCV: 89.6 fL (ref 78.0–100.0)
MONOS PCT: 7 %
Monocytes Absolute: 0.6 10*3/uL (ref 0.1–1.0)
NEUTROS ABS: 6.2 10*3/uL (ref 1.7–7.7)
NEUTROS PCT: 70 %
Platelets: 410 10*3/uL — ABNORMAL HIGH (ref 150–400)
RBC: 3.38 MIL/uL — ABNORMAL LOW (ref 4.22–5.81)
RDW: 17 % — ABNORMAL HIGH (ref 11.5–15.5)
WBC: 8.8 10*3/uL (ref 4.0–10.5)

## 2016-03-29 LAB — BASIC METABOLIC PANEL
ANION GAP: 8 (ref 5–15)
BUN: 12 mg/dL (ref 6–20)
CALCIUM: 9.4 mg/dL (ref 8.9–10.3)
CHLORIDE: 104 mmol/L (ref 101–111)
CO2: 26 mmol/L (ref 22–32)
Creatinine, Ser: 0.66 mg/dL (ref 0.61–1.24)
GFR calc non Af Amer: >60 mL/min (ref >60)
Glucose, Bld: 116 mg/dL — ABNORMAL HIGH (ref 65–99)
POTASSIUM: 4.4 mmol/L (ref 3.5–5.1)
Sodium: 138 mmol/L (ref 135–145)

## 2016-03-29 LAB — GLUCOSE, CAPILLARY: GLUCOSE-CAPILLARY: 141 mg/dL — AB (ref 65–99)

## 2016-03-29 LAB — LIPASE, BLOOD: LIPASE: 52 U/L — AB (ref 11–51)

## 2016-03-29 LAB — AMYLASE: Amylase: 297 U/L — ABNORMAL HIGH (ref 28–100)

## 2016-03-29 MED ORDER — NICOTINE 21 MG/24HR TD PT24: MEDICATED_PATCH | TRANSDERMAL | Status: DC

## 2016-03-29 MED ORDER — NADOLOL 40 MG PO TABS: 40.0000 mg | ORAL_TABLET | Freq: Every day | ORAL | Status: DC

## 2016-03-29 MED ORDER — INSULIN GLARGINE 100 UNIT/ML ~~LOC~~ SOLN: 10.0000 [IU] | Freq: Every day | SUBCUTANEOUS | Status: DC

## 2016-03-29 MED ORDER — PANCRELIPASE (LIP-PROT-AMYL) 25000 UNITS PO CPEP: 2.0000 | ORAL_CAPSULE | Freq: Three times a day (TID) | ORAL | Status: DC

## 2016-03-29 MED ORDER — METFORMIN HCL 1000 MG PO TABS: 1000.0000 mg | ORAL_TABLET | Freq: Two times a day (BID) | ORAL | Status: DC

## 2016-03-29 MED ORDER — BUSPIRONE HCL 5 MG PO TABS: 5.0000 mg | ORAL_TABLET | Freq: Two times a day (BID) | ORAL | Status: DC

## 2016-03-29 MED ORDER — TRAZODONE HCL 150 MG PO TABS: 150.0000 mg | ORAL_TABLET | Freq: Every day | ORAL | Status: DC

## 2016-03-29 NOTE — Progress Notes (Signed)
Patient discharged to home accompanied by his significant other.

## 2016-03-29 NOTE — Discharge Instructions (Signed)
Inpatient Rehab Discharge Instructions  Brandon SmilingKeith W Zobrist Discharge date and time: No discharge date for patient encounter.   Activities/Precautions/ Functional Status: Activity: activity as tolerated Diet: diabetic diet Wound Care: none needed Functional status:  ___ No restrictions     ___ Walk up steps independently ___ 24/7 supervision/assistance   ___ Walk up steps with assistance ___ Intermittent supervision/assistance  ___ Bathe/dress independently ___ Walk with walker     _x__ Bathe/dress with assistance ___ Walk Independently    ___ Shower independently ___ Walk with assistance    ___ Shower with assistance ___ No alcohol     ___ Return to work/school ________  Special Instructions: No driving tobacco or alcohol products  COMMUNITY REFERRALS UPON DISCHARGE:    Home Health:   PT, OT, RN, SP, SW  Agency:[facility] Phone:[phone]   Date of last service:03/29/2016  Medical Equipment/Items Ordered:WHEELCHAIR, BEDSIDE COMMODE, ROLLING WALKER & TUB BENCH  Agency/Supplier:[facility]   [phone] OTHER: [name] TO CONTINUE TO PURSUE DISABILITY APPEAL   GENERAL COMMUNITY RESOURCES FOR PATIENT/FAMILY: Support Groups:[facility] SUPPORT GROUP EVERY SECOND Thursday @ 3:00-4:00 PM ON THE  REHAB UNIT. QUESTIONS CONTACT KATIE [phone]   STROKE/TIA DISCHARGE INSTRUCTIONS SMOKING Cigarette smoking nearly doubles your risk of having a stroke & is the single most alterable risk factor  If you smoke or have smoked in the last 12 months, you are advised to quit smoking for your health.  Most of the excess cardiovascular risk related to smoking disappears within a year of stopping.  Ask you doctor about anti-smoking medications  [place] Quit Line: 1-800-QUIT NOW  Free Smoking Cessation Classes [phone]  CHOLESTEROL Know your levels; limit fat & cholesterol in your diet  Lipid Panel     Component Value Date/Time   CHOL 74 03/06/2016 0916   TRIG 51 03/06/2016 0916   HDL 27* 03/06/2016 0916   CHOLHDL 2.7 03/06/2016 0916   VLDL 10 03/06/2016 0916   LDLCALC 37 03/06/2016 0916      Many patients benefit from treatment even if their cholesterol is at goal.  Goal: Total Cholesterol (CHOL) less than 160  Goal:  Triglycerides (TRIG) less than 150  Goal:  HDL greater than 40  Goal:  LDL (LDLCALC) less than 100   BLOOD PRESSURE American Stroke Association blood pressure target is less that 120/80 mm/Hg  Your discharge blood pressure is:  BP: 116/71 mmHg  Monitor your blood pressure  Limit your salt and alcohol intake  Many individuals will require more than one medication for high blood pressure  DIABETES (A1c is a blood sugar average for last 3 months) Goal HGBA1c is under 7% (HBGA1c is blood sugar average for last 3 months)  Diabetes:    Lab Results  Component Value Date   HGBA1C 5.6 03/11/2016     Your HGBA1c can be lowered with medications, healthy diet, and exercise.  Check your blood sugar as directed by your physician  Call your physician if you experience unexplained or low blood sugars.  PHYSICAL ACTIVITY/REHABILITATION Goal is 30 minutes at least 4 days per week  Activity: Increase activity slowly, Therapies: Physical Therapy: Home Health Return to work:   Activity decreases your risk of heart attack and stroke and makes your heart stronger.  It helps control your weight and blood pressure; helps you relax and can improve your mood.  Participate in a regular exercise program.  Talk with your doctor about the best form of exercise for you (dancing, walking, swimming, cycling).  DIET/WEIGHT Goal is to maintain a healthy weight  Your discharge diet is: Diet Carb Modified Fluid consistency:: Thin; Room service appropriate?: Yes  liquids Your height is:  Height: 6\' 1"  (185.4 cm) Your current weight is: Weight: 85.5 kg (188 lb 7.9 oz) Your Body Mass Index (BMI) is:  BMI (Calculated): 24.9  Following the type of diet specifically  designed for you will help prevent another stroke.  Your goal weight range is:    Your goal Body Mass Index (BMI) is 19-24.  Healthy food habits can help reduce 3 risk factors for stroke:  High cholesterol, hypertension, and excess weight.  RESOURCES Stroke/Support Group:  Call [phone]   STROKE EDUCATION PROVIDED/REVIEWED AND GIVEN TO PATIENT Stroke warning signs and symptoms How to activate emergency medical system (call 911). Medications prescribed at discharge. Need for follow-up after discharge. Personal risk factors for stroke. Pneumonia vaccine given:  Flu vaccine given:  My questions have been answered, the writing is legible, and I understand these instructions.  I will adhere to these goals & educational materials that have been provided to me after my discharge from the hospital.       My questions have been answered and I understand these instructions. I will adhere to these goals and the provided educational materials after my discharge from the hospital.  Patient/Caregiver Signature _______________________________ Date __________  Clinician Signature _______________________________________ Date __________  Please bring this form and your medication list with you to all your follow-up doctor's appointments.

## 2016-03-29 NOTE — Progress Notes (Signed)
[place] [facility]ILITATION     PROGRESS NOTE  Subjective/Complaints:  Pt laying in bed.  He is looking forward to going home and would like his discharge paperwork asap.    ROS: Denies CP, SOB, nausea, vomiting, diarrhea.   Objective: Vital Signs: Blood pressure 148/82, pulse 84, temperature 98.1 F (36.7 C), temperature source Oral, resp. rate 18, height  (1.854 m), weight 84.4 kg (186 lb 1.1 oz), SpO2 100 %. No results found.  Recent Labs  03/29/16 0605  WBC 8.8  HGB 9.6*  HCT 30.3*  PLT 410*    Recent Labs  03/29/16 0605  NA 138  K 4.4  CL 104  GLUCOSE 116*  BUN 12  CREATININE 0.66  CALCIUM 9.4   CBG (last 3)   Recent Labs  03/28/16 1639 03/28/16 2037 03/29/16 0649  GLUCAP 148* 121* 141*    Wt Readings from Last 3 Encounters:  03/29/16 84.4 kg (186 lb 1.1 oz)  03/11/16 85.1 kg (187 lb 9.8 oz)  04/12/15 92.987 kg (205 lb)    Physical Exam:  BP 148/82 mmHg  Pulse 84  Temp(Src) 98.1 F (36.7 C) (Oral)  Resp 18  Ht  (1.854 m)  Wt 84.4 kg (186 lb 1.1 oz)  BMI 24.55 kg/m2  SpO2 100% Constitutional: He appears well-developed and well-nourished.  HENT: Normocephalic and atraumatic.  Eyes: EOM are normal. Right eye exhibits no discharge. Left eye exhibits no discharge.  Cardiovascular: Normal rate and regular rhythm.  Respiratory: Effort normal and breath sounds normal. No respiratory distress.  GI: Soft. Bowel sounds are normal. He exhibits no distension.  Musculoskeletal: He exhibits no edema or tenderness.  Neurological: He is alert and oriented 3.  Motor: Right upper extremity/right lower extremity: 5/5 proximal to distal Left upper extremity: 4+/5 delt, bicep, wrist, tricep, HI with motor apraxia (continues to improve) Left lower extremity: 4+/5 HF, KE, 4+/5 ADF/PF with apraxia(continues to improve) Skin: Skin is warm and dry.  Psychiatric: He has flat mood and affect. His behavior is  normal  Assessment/Plan: 1. Functional deficits secondary to right MCA territory infarct as well as history of CVA with residual weakness which require 3+ hours per day of interdisciplinary therapy in a comprehensive inpatient rehab setting. Physiatrist is providing close team supervision and 24 hour management of active medical problems listed below. Physiatrist and rehab team continue to assess barriers to discharge/monitor patient progress toward functional and medical goals.  Function:  Bathing Bathing position Bathing activity did not occur: Refused Position: Systems developer parts bathed by patient: Right arm, Left arm, Chest, Abdomen, Front perineal area, Buttocks, Right upper leg, Left upper leg, Right lower leg, Left lower leg Body parts bathed by helper: Back  Bathing assist Assist Level: Supervision or verbal cues      Upper Body Dressing/Undressing Upper body dressing   What is the patient wearing?: Pull over shirt/dress     Pull over shirt/dress - Perfomed by patient: Thread/unthread right sleeve, Put head through opening, Thread/unthread left sleeve, Pull shirt over trunk Pull over shirt/dress - Perfomed by helper: Thread/unthread left sleeve, Pull shirt over trunk        Upper body assist Assist Level: More than reasonable time   Set up : To obtain clothing/put away  Lower Body Dressing/Undressing Lower body dressing   What is the patient wearing?: Pants, Socks, Shoes, AFO Underwear - Performed by patient: Thread/unthread right underwear leg, Pull underwear up/down Underwear - Performed by helper:  Thread/unthread left underwear leg Pants- Performed by patient: Thread/unthread right pants leg, Pull pants up/down, Thread/unthread left pants leg Pants- Performed by helper: Thread/unthread left pants leg Non-skid slipper socks- Performed by patient: Don/doff right sock, Don/doff left sock Non-skid slipper socks- Performed by helper: Don/doff left sock Socks  - Performed by patient: Don/doff right sock, Don/doff left sock   Shoes - Performed by patient: Don/doff right shoe Shoes - Performed by helper: Don/doff left shoe   AFO - Performed by helper: Don/doff left AFO      Lower body assist Assist for lower body dressing: Touching or steadying assistance (Pt > 75%) (assist needed for Lt shoe and AFO)      Toileting Toileting Toileting activity did not occur: N/A Toileting steps completed by patient: Adjust clothing prior to toileting, Performs perineal hygiene Toileting steps completed by helper: Adjust clothing prior to toileting Toileting Assistive Devices: Grab bar or rail  Toileting assist Assist level: Supervision or verbal cues   Transfers Chair/bed transfer   Chair/bed transfer method: Ambulatory Chair/bed transfer assist level: Supervision or verbal cues Chair/bed transfer assistive device: Biochemist, clinicalWalker Mechanical lift: Landscape architecttedy   Locomotion Ambulation     Max distance: [id number] Assist level: Supervision or Careers adviserverbal cues   Wheelchair Wheelchair activity did not occur: N/A Type: Manual Max wheelchair distance: 75 Assist Level: Moderate assistance (Pt 50 - 74%)  Cognition Comprehension Comprehension assist level: Follows basic conversation/direction with extra time/assistive device  Expression Expression assist level: Expresses basic needs/ideas: With extra time/assistive device  Social Interaction Social Interaction assist level: Interacts appropriately 90% of the time - Needs monitoring or encouragement for participation or interaction.  Problem Solving Problem solving assist level: Solves basic 75 - 89% of the time/requires cueing 10 - 24% of the time  Memory Memory assist level: Recognizes or recalls 75 - 89% of the time/requires cueing 10 - 24% of the time    Medical Problem List and Plan: 1. Altered mental status with left sided weakness secondary to right MCA territory infarct as well as history of CVA September 2013 with  residual left-sided weakness -D/c today  -Will see pt in 1-2 weeks for transitional care management. 2. DVT Prophylaxis/Anticoagulation: SCDs. Monitor for any signs of DVT 3. Pain Management: Tylenol as needed 4. Mood: Buspar 5 mg twice a day 5. Neuropsych: This patient is capable of making decisions on his own behalf. 6. Skin/Wound Care: Routine skin checks 7. Fluids/Electrolytes/Nutrition: Routine I&O   Diet advanced to a heart healthy 8. Upper GI bleed. Follow-up per gastroenterology. Continue Protonix  Hemoglobin 9.6 on 7/7 (improving). 9. Ischemic cardiac heart disease. No chest pain or shortness of breath. Aspirin 81 mg daily. No Plavix at this time due to GI bleed 10. Hypertension. Corgard 40 mg daily. Follow with increased mobility 11. Hyponatremia. 138 on 7/7 (stable) 12. Diabetes mellitus. Hemoglobin A1c 5.6.   Lantus insulin 5 units daily, increased to 10u on 7/1  Metformin 500 daily started on 6/30, increased to BID on 7/4, increased to 1000 BID on 7/6. After discussion with pharmacist, no risk with pancreatitis.  Will cont to monitor 13. Pancreatitis. Continue Creon 24,000 units 3 times a day 14. Alcohol tobacco abuse. Nicoderm patch. Provide counseling 15. Klebsiella UTI. Completed course of Ceftin. 16.  Sleep Disturbance- trazodone increased to 100mg  on 6/27 with improvement  Avoid benzos with hx of ETOH abuse 17. Dry eyes  Artificial tears ordered    LOS (Days) 17 A FACE TO FACE EVALUATION WAS PERFORMED  Ankit Willeen CassAnil  Patel 03/29/2016 10:38 AM

## 2016-03-29 NOTE — Progress Notes (Signed)
Social Work  Discharge Note  The overall goal for the admission was met for:   Discharge location: Yes-HOME TO [name] WILL PROVIDE 24 HR CARE  Length of Stay: Yes-17 DAYS  Discharge activity level: Yes-SUPERVISION/MIN ASSIST LEVEL  Home/community participation: Yes  Services provided included: MD, RD, PT, OT, SLP, RN, CM, TR, Pharmacy, Neuropsych and SW  Financial Services: Medicaid  Follow-up services arranged: Home Health: [facility]-PT,OT,RN,SW,SP, DME: [facility]-ROLLING WALKER, WHEELCHAIR, TUB BENCH AND BEDSIDE COMMODE and Patient/Family has no preference for HH/DME agencies  Comments (or additional information):[name] WAS [place]. GAVE INFORMATION FOR DISABILITY APPEAL. SUBSTANCE ABUSE RESOURCES GIVEN TO THEM. PT DOES NOT PLAN TO DRINK ANYMORE AND [name] WILL NOT PROVIDE TO HIM.  Patient/Family verbalized understanding of follow-up arrangements: Yes  Individual responsible for coordination of the follow-up plan: [name]  Confirmed correct DME delivered: Elease Hashimoto 03/29/2016    Elease Hashimoto

## 2016-04-02 ENCOUNTER — Telehealth: Payer: Self-pay | Admitting: *Deleted

## 2016-04-02 NOTE — Telephone Encounter (Signed)
Contact made with [name] (significant other lives in home)   1. Are you/is patient experiencing any problems since coming home? Are there any questions regarding any aspect of care?  Having pain in left shoulder (stroke affected) [name] had given him aleve.  I cautioned her not to give aleve until he is seen by Dr Pearlean BrownieSethi due to being on ASA and Plavix. Encouraged used of tramadol which she says he is taking.  2. Are there any questions regarding medications administration/dosing? Are meds being taken as prescribed? Patient should review meds with caller to confirm  No questions Reviewed and discouraged use of Aleve until MD approved. 3. Have there been any falls? No 4. Has Home Health been to the house and/or have they contacted you? If not, have you tried to contact them? Can we help you contact them? No [facility]I called [facility] and they are not finding him in system so questioned if something missing in referral.  [facility] 5. Are bowels and bladder emptying properly? Are there any unexpected incontinence issues? If applicable, is patient following bowel/bladder programs?  No problems 6. Any fevers, problems with breathing, unexpected pain?  No 7. Are there any skin problems or new areas of breakdown? No 8. Has the patient/family member arranged specialty MD follow up (ie cardiology/neurology/renal/surgical/etc)?  Can we help arrange? Has appointment with Dr Pearlean BrownieSethi 04/11/16 @9 :00 (this was 1 year follow up prob sched before this hospitalization)  Appointment given with Dr Allena KatzPatel today. 9. Does the patient need any other services or support that we can help arrange? [facility] & [facility]que[facility]y Dupree MSW [facility] 10. Are caregivers following through as expected in assisting the patient? Yes Has the patient quit smoking, drinking alcohol, or using drugs as recommended? Yes "he knows he will not be around if he doesn't quit"  Appointment with Dr Allena KatzPatel 04/11/16 @  1:00 arrive @ 12:30 Address reviewed with Marchelle FolksAmanda

## 2016-04-11 ENCOUNTER — Encounter: Payer: Self-pay | Admitting: Physical Medicine & Rehabilitation

## 2016-04-11 ENCOUNTER — Encounter: Payer: Medicaid Other | Attending: Physical Medicine & Rehabilitation | Admitting: Physical Medicine & Rehabilitation

## 2016-04-11 ENCOUNTER — Encounter: Payer: Self-pay | Admitting: Neurology

## 2016-04-11 ENCOUNTER — Encounter: Payer: Medicaid Other | Admitting: Physical Medicine & Rehabilitation

## 2016-04-11 ENCOUNTER — Ambulatory Visit (INDEPENDENT_AMBULATORY_CARE_PROVIDER_SITE_OTHER): Payer: Medicaid Other | Admitting: Neurology

## 2016-04-11 VITALS — BP 98/65 | HR 67 | Resp 14

## 2016-04-11 VITALS — BP 100/67 | HR 70 | Ht 73.0 in | Wt 200.8 lb

## 2016-04-11 DIAGNOSIS — I10 Essential (primary) hypertension: Secondary | ICD-10-CM

## 2016-04-11 DIAGNOSIS — I213 ST elevation (STEMI) myocardial infarction of unspecified site: Secondary | ICD-10-CM | POA: Insufficient documentation

## 2016-04-11 DIAGNOSIS — R42 Dizziness and giddiness: Secondary | ICD-10-CM | POA: Insufficient documentation

## 2016-04-11 DIAGNOSIS — I63511 Cerebral infarction due to unspecified occlusion or stenosis of right middle cerebral artery: Secondary | ICD-10-CM

## 2016-04-11 DIAGNOSIS — I6521 Occlusion and stenosis of right carotid artery: Secondary | ICD-10-CM | POA: Diagnosis not present

## 2016-04-11 DIAGNOSIS — F329 Major depressive disorder, single episode, unspecified: Secondary | ICD-10-CM | POA: Insufficient documentation

## 2016-04-11 DIAGNOSIS — K219 Gastro-esophageal reflux disease without esophagitis: Secondary | ICD-10-CM | POA: Diagnosis not present

## 2016-04-11 DIAGNOSIS — K922 Gastrointestinal hemorrhage, unspecified: Secondary | ICD-10-CM | POA: Diagnosis not present

## 2016-04-11 DIAGNOSIS — R531 Weakness: Secondary | ICD-10-CM | POA: Insufficient documentation

## 2016-04-11 DIAGNOSIS — R251 Tremor, unspecified: Secondary | ICD-10-CM | POA: Diagnosis not present

## 2016-04-11 DIAGNOSIS — I69398 Other sequelae of cerebral infarction: Secondary | ICD-10-CM

## 2016-04-11 DIAGNOSIS — F1010 Alcohol abuse, uncomplicated: Secondary | ICD-10-CM

## 2016-04-11 DIAGNOSIS — F419 Anxiety disorder, unspecified: Secondary | ICD-10-CM | POA: Diagnosis not present

## 2016-04-11 DIAGNOSIS — R269 Unspecified abnormalities of gait and mobility: Secondary | ICD-10-CM

## 2016-04-11 DIAGNOSIS — R200 Anesthesia of skin: Secondary | ICD-10-CM | POA: Insufficient documentation

## 2016-04-11 DIAGNOSIS — E119 Type 2 diabetes mellitus without complications: Secondary | ICD-10-CM

## 2016-04-11 DIAGNOSIS — R32 Unspecified urinary incontinence: Secondary | ICD-10-CM | POA: Diagnosis not present

## 2016-04-11 DIAGNOSIS — R410 Disorientation, unspecified: Secondary | ICD-10-CM | POA: Insufficient documentation

## 2016-04-11 DIAGNOSIS — I69359 Hemiplegia and hemiparesis following cerebral infarction affecting unspecified side: Secondary | ICD-10-CM | POA: Insufficient documentation

## 2016-04-11 DIAGNOSIS — I2510 Atherosclerotic heart disease of native coronary artery without angina pectoris: Secondary | ICD-10-CM | POA: Diagnosis not present

## 2016-04-11 DIAGNOSIS — Z5189 Encounter for other specified aftercare: Secondary | ICD-10-CM | POA: Insufficient documentation

## 2016-04-11 DIAGNOSIS — Z720 Tobacco use: Secondary | ICD-10-CM

## 2016-04-11 DIAGNOSIS — Z87891 Personal history of nicotine dependence: Secondary | ICD-10-CM | POA: Diagnosis not present

## 2016-04-11 DIAGNOSIS — I6930 Unspecified sequelae of cerebral infarction: Secondary | ICD-10-CM

## 2016-04-11 DIAGNOSIS — G479 Sleep disorder, unspecified: Secondary | ICD-10-CM

## 2016-04-11 DIAGNOSIS — E785 Hyperlipidemia, unspecified: Secondary | ICD-10-CM | POA: Insufficient documentation

## 2016-04-11 NOTE — Progress Notes (Signed)
PATIENT: Brandon [name] DOB: [birth year]  REASON FOR VISIT: routine follow up for stroke HISTORY FROM: patient  HISTORY OF PRESENT ILLNESS: 09/24/12 (PS): [name] is a 50 year old Caucasian male seen for followup after hospital admission for stroke on 06/10/2012. He presented with sudden onset of left facial droop and slurred speech and left arm and left numbness. He was given IV TPA without complications. He has showed significant improvement except for persistent left-sided numbness. MRI scan of the brain showed only small right parietal and insular cortex infarct. Carotid ultrasound showed heavily calcified right internal carotid artery with occlusion. Left side showed moderate calcification without significant stenosis. CT angiogram showed near occlusion of the right proximal ICA with trickle flow beyond. 2-D echo showed unusual wall motion abnormalities with hypokinesis of the anterior septum. Ejection fraction of 55%. Nuclear cardiac scan showed minimum hypokinesia involving the inferior wall and septum with 51% ejection fraction and no cardiac source of embolism. Patient had cerebral catheter angiogram on 06/15/2012 by Dr. 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 Patient was found to have hyperlipidemia, hypertension and smoking as vascular factors. He was started on aspirin and Plavix and states he has done well. His speech difficulties have improved and facial droop as well. He still has some mild paresthesias in the left hand. 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 He has applied for Medicaid and application is pending. He has not been doing outpatient physical and occupational therapy as he has no  insurance. He has cut back smoking but not completely quit yet. His blood pressure today is 132/96.  10/20/13 (LL): Patient returns for 1 year follow up. Since last visit 1 year ago he has had a moderate recovery but was not able to return to work due to easy fatigueability and cognitive difficulties. Repeat CT angiogram Head and Neck showed no change in 50 % stenosis of the left ICA in the bulb region with occlusion of the right vertebral artery. Complete occlusion of the right ICA at the bulb. Reconstituted flow in the right ICA at the skull base. Flow in both distal vertebral arteries with the right being quite small. No basilar stenosis. No posterior circulation branch vessel stenosis or occlusion. He continues to smoke and reports that he has tried many times to quit. He tried Chantix but due to mood changes and agitation had to stop. He complains of near every day headaches that are incapacitating. They are described as "all-over", not unilateral, pounding in quality with sharp shooting pains coming up from the occipital area. He has tried tramadol for these headaches, with some relief but he takes them more frequently than ordered which has led to stomach irritation.  12/02/13 (LL): Patient returns for follow up. His MRI brain shows Mild diffuse and mild-moderate perisylvian atrophy, moderate ventriculomegaly. MRI cervical spine revealed mild stenosis and disc bulging at several levels. His carotid doppler study was unchanged from previous. Since last visit his headaches have continued, essentially unchanged. His wife states he has mood swings, is easily agitated. He is still smoking. He has not gotten any relief from headaches from Tizanidine, it only makes him tired. He has used a family member's Flexeril, which worked  better, and helped him rest.   Update 06/09/14 (LL): [name] returns to the office for stroke followup. Since last visit, there has not been much change per patient and family member.  He continues to have headaches and neck pain.  He states his PCP Lacie Scottsiemeyer can no longer prescribe narcotics and he was referred to a pain clinic and put on Suboxone. He continues to smoke and drink alcohol despite having acute on chronic pancreatitis with multiple pseudocysts, with a flareup in May.  He does not sleep well.  He is requesting Percocet today. Blood pressure is well controlled, it is 133/78 in the office today. He is tolerating Plavix well with no signs of significant bleeding or bruising. He was not able to tolerate Depakote ER prescribed at last visit, stating it made him feel "like I was someone else," and quit taking it after 3 days. Update 04/12/2015 : He returns for follow-up after last visit 10 months ago. Is a complaint by his wife. Patient had elective left carotid stent done by Dr. Festus BarrenJason Dew in [place] on 04/06/15 and the procedure went well and in fact the patient has noticed improvement in his mental clarity, he states "" my brain fog has lifted and I can think more clearly.`` He has also noticed improvement in headaches and overall he feels better. He had also recently had a coronary stent by Dr. Welton FlakesKhan in [place] He has had no recurrent stroke or TIA symptoms. He continues to smoke but plans to cut back and smokes now only half pack per day. He states his blood pressure is well controlled and today it is 104/71 in office. He has not had any recent lipid profile checked. She is tolerating aspirin and Plavix without bleeding, bruising or other side effects. Update 04/11/2016 : He returns for follow-up today accompanied by his wife after last visit 1 year ago. He was hospitalized on 03/05/16 with left-sided weakness in the setting of an acute GI bleeding.He was last seen normal by his wife at 3 PM on 03/04/2016, and last heard to be normal by phone at midnight. On 03/05/2016, his son found him at home immobile on the couch covered in urine and stool. The patient was transported by EMS to  the ED and exam revealed dense left sided weakness. He was out of the IV tPA and endovascular/interventional time windows. He was unable to get up due to feeling "dizzy" and felt as though he would fall when he "tried to walk". He has mild left sided residual deficits from prior stroke. Per EMS, when they tried to sit him up he lost consciousness and was incontinent again of stool. Patient was not administered IV t-PA secondary to delay in arrival. He was admitted to the stepdown unit for further evaluation and treatment. MRI scan of the brain showed patchy right MCA infarcts felt to be secondary to failure of collaterals circulation involving the right basal ganglia and insular cortex. CT angiogram of the head and neck confirmed chronic right known right ICA occlusion and right vertebral occlusion but left ICA stent was patent. EEG was normal without seizure activity. LDL cholesterol was 37 mg percent. Hemoglobin A1c was 5.8. Transthoracic echo showed normal ejection fraction without cardiac source of embolism. Patient underwent upper endoscopy by gastroenterologist which did not show an obvious source of bleeding. He was given blood transfusion and his condition was stabilized and gradually improve. He was transferred to inpatient rehabilitation where he stayed for 4 weeks and has recently  come home 5 days ago. He is yet awaiting home physical and occupational therapy to begin. Is able to walk with a walker but still has left-sided weakness and left leg is dragging. He has been started back on aspirin 81 mg daily and is tolerating well without any recurrent bleeding. He has quit smoking completely. He has been compliant with his medications. His blood pressure has been running in the low 100 range. REVIEW OF SYSTEMS: Full 14 system review of systems performed and notable only for: Eye itching, excessive eating, frequent waking, daytime sleepiness, sleep talking, back pain, walking difficulty, neck pain and  stiffness, memory loss, dizziness, speech difficulty, weakness, confusion, depression, nervousness, anxiety, GI bleeding hallucinations, hyperactivity and all other systems negative  ALLERGIES: Allergies  Allergen Reactions  . No Known Allergies     HOME MEDICATIONS: Outpatient Prescriptions Prior to Visit  Medication Sig Dispense Refill  . albuterol (PROAIR HFA) 108 (90 Base) MCG/ACT inhaler Inhale 2 puffs into the lungs every 6 (six) hours as needed for wheezing or shortness of breath.     Marland Kitchen aspirin 81 MG tablet Take 81 mg by mouth every morning.     . B Complex Vitamins (VITAMIN-B COMPLEX PO) Take 1 tablet by mouth daily.     . busPIRone (BUSPAR) 5 MG tablet Take 1 tablet (5 mg total) by mouth 2 (two) times daily. 60 tablet 0  . insulin glargine (LANTUS) 100 UNIT/ML injection Inject 0.1 mLs (10 Units total) into the skin daily. 10 mL 11  . lubiprostone (AMITIZA) 24 MCG capsule Take 24 mcg by mouth as needed for constipation.    . metFORMIN (GLUCOPHAGE) 1000 MG tablet Take 1 tablet (1,000 mg total) by mouth 2 (two) times daily with a meal. 60 tablet 0  . Multiple Vitamin (MULTIVITAMIN) tablet Take 1 tablet by mouth daily.    . nadolol (CORGARD) 40 MG tablet Take 1 tablet (40 mg total) by mouth daily. 30 tablet 1  . nicotine (NICODERM CQ - DOSED IN MG/24 HOURS) 21 mg/24hr patch 21 mg patch daily 1 week and 14 mg patch daily 3 weeks then 7 mg patch daily 3 weeks. 28 patch 0  . Omega-3 Fatty Acids (FISH OIL PO) Take 1 capsule by mouth daily.    Marland Kitchen omeprazole (PRILOSEC) 20 MG capsule Take 2 capsules (40 mg total) by mouth daily. 60 capsule 1  . Pancrelipase, Lip-Prot-Amyl, (ZENPEP) 25000 units CPEP Take 2 capsules by mouth 3 (three) times daily with meals. 450 capsule 0  . simvastatin (ZOCOR) 20 MG tablet Take 20 mg by mouth daily at 6 PM.     . tiotropium (SPIRIVA HANDIHALER) 18 MCG inhalation capsule Place 18 mcg into inhaler and inhale daily.     . traMADol (ULTRAM) 50 MG tablet Take 1  tablet (50 mg total) by mouth every 8 (eight) hours as needed for severe pain. for pain    . traZODone (DESYREL) 150 MG tablet Take 1 tablet (150 mg total) by mouth at bedtime. 30 tablet 0   No facility-administered medications prior to visit.   Meds ordered this encounter  Medications  . tiotropium (SPIRIVA HANDIHALER) 18 MCG inhalation capsule    Sig: Place into inhaler and inhale.  . Buprenorphine HCl-Naloxone HCl (SUBOXONE) 4-1 MG FILM    Sig: Place under the tongue as needed.    PHYSICAL EXAM Filed Vitals:   04/11/16 0904  BP: 100/67  Pulse: 70  Height:  (1.854 m)  Weight: 200 lb 12.8 oz (91.082  kg)   Body mass index is 26.5 kg/(m^2).  Generalized: Frail middle-aged Caucasian male, in no acute distress,   Head: normocephalic and atraumatic.   Neck: Supple, no carotid bruits  Cardiac: Regular rate rhythm, no murmur  Musculoskeletal: No deformity   Neurological examination  Mentation: Alert oriented to time, place, history taking. Follows all commands speech and language fluent very mild intermittent dysarthria Cranial nerve II-XII: Pupils were equal round reactive to light extraocular movements were full, visual field were full on confrontational test. Facial sensation and strength were normal. hearing was intact to finger rubbing bilaterally. Uvula tongue midline. head turning and shoulder shrug and were normal and symmetric.Tongue protrusion into cheek strength was normal.  Motor: normal bulk and tone, full strength in the BUE, BLE, no pronator drift. Mild left hemiparesis 4/5 strength. Weakness of right left grip and intrinsic hand muscles. Left hip flexors and ankle dorsiflexors week 4/5. Diminished fine finger movements on the left. Minimum left grip weakness. Orbits right over left approximately.  Sensory: normal and symmetric to light touch, pinprick, and vibration, except slight decrease in left upper extremity.  Coordination: finger-nose-finger, heel-to-shin intact  on the right and slightly impaired on the left.  Gait and Station: Rising up from seated position without assistance, using a walker. Ranks the left leg. Stooped posture and leans to the left. Reflexes: Deep tendon reflexes aymmetric and brisker on the left  NIH SS 3 . modified Rankin 2   DIAGNOSTIC DATA (LABS, IMAGING, TESTING) - I reviewed patient records, labs, notes, testing and imaging myself where available.  - I reviewed patient records, labs, notes, testing and imaging myself where available.  11/04/13 Abnormal MRI cervical spine (without) demonstrating:  1. At C3-4. C4-5, C5-6: disc bulging and uncovertebral joint hypertrophy with mild spinal stenosis and severe biforaminal foraminal stenosis; no cord signal abnormalities  2. At T2-3: disc bulging with mild right and moderate left foraminal stenosis   Abnormal MRI brain (without) demonstrating:  1. Mild diffuse and mild-moderate perisylvian atrophy. Moderate ventriculomegaly.  2. Mild chronic small vessel ischemic disease.  3. No acute findings.  4. No new findings compared to MRI on 06/11/12.   ASSESSMENT: Mr. Umar is a 50 year old Caucasian male with a right insular and frontal cortex infarct secondary to embolization from proximal right internal carotid artery dissection with occlusion in the neck with moderate left proximal internal carotid artery focal dissection as well as chronically occluded right vertebral artery. Vascular risk factors of hypertension, cardiac disease, hyperlipidemia and smoking. Recent admission in June 2017 secondary to right hemispheric infarcts secondary to failure of collaterals circulation in the setting of an acute GI bleed  PLAN: I had a long d/w patient and his wife about his recent stroke,carotid occlusion risk for recurrent stroke/TIAs, personally independently reviewed imaging studies and stroke evaluation results and answered questions.Continue aspirin 81 mg daily  for secondary stroke  prevention and maintain strict control of hypertension with blood pressure goal below 130/90, diabetes with hemoglobin A1c goal below 6.5% and lipids with LDL cholesterol goal below 70 mg/dL. I also advised the patient to eat a healthy diet with plenty of whole grains, cereals, fruits and vegetables, exercise regularly and maintain ideal body weight .I have complimented him on smoking cessation and encouraged him to continue to do so. I advised him to maintain adequate hydration and to discuss with his cardiologist possible dose reduction in [place] due to his low blood pressure .greater than 50% time during this 25 minute visit was spent  on counseling and coordination of care about stroke risk, carotid occlusion and answering questions. Followup in the future with stroke nurse practitioner in 6 months or call earlier if necessary.       Return in about 6 months (around 10/12/2016).  Delia Heady, MD  04/11/2016, 9:39 AM [facility] [address] [place], [place] [address] [phone]  Note: This document was prepared with digital dictation and possible smart phrase technology. Any transcriptional errors that result from this process are unintentional.

## 2016-04-11 NOTE — Progress Notes (Deleted)
Subjective:    Patient ID: Brandon [name], male    DOB: 12-07-1965, 50 y.o.   MRN: [id number]  HPI    Review of Systems  Constitutional: Positive for unexpected weight change.  Respiratory: Positive for shortness of breath.   Endocrine:       High blood sugar  Musculoskeletal: Positive for gait problem.  Neurological: Positive for dizziness, tremors, weakness and numbness.       Tingling   Psychiatric/Behavioral: Positive for confusion and dysphoric mood. The patient is nervous/anxious.   All other systems reviewed and are negative.      Objective:   Physical Exam        Assessment & Plan:   Pain Inventory Average Pain 8 Pain Right Now 5 My pain is intermittent and dull  In the last 24 hours, has pain interfered with the following? General activity 7 Relation with others 2 Enjoyment of life 7 What TIME of day is your pain at its worst? daytime Sleep (in general) Fair  Pain is worse with: walking, bending, sitting, standing and some activites Pain improves with: nothing Relief from Meds: 0  Mobility walk with assistance use a walker how many minutes can you walk? 20 do you drive?  no Do you have any goals in this area?  yes  Function disabled: date disabled in  process I need assistance with the following:  dressing, bathing, toileting, meal prep, household duties and shopping  Neuro/Psych weakness numbness tremor tingling trouble walking dizziness confusion depression anxiety  Prior Studies Any changes since last visit?  no  Physicians involved in your care Any changes since last visit?  yes   Family History  Problem Relation Age of Onset  . Stroke Mother     deceased  . Coronary artery disease Mother   . Alcohol abuse Mother   . Cancer Mother   . Hypertension Father     alive  . Alcohol abuse Father   . Diabetes Father   . Kidney disease Father   . Stroke Maternal Grandmother    Social History   Social History  .  Marital Status: Single    Spouse Name: [name]  . Number of Children: 0  . Years of Education: 12   Occupational History  . unemployed     previously worked at [employer] History Main Topics  . Smoking status: Former Smoker -- 1.00 packs/day for 30 years    Types: Cigarettes    Quit date: 04/12/2015  . Smokeless tobacco: Never Used     Comment: smoking less  . Alcohol Use: No     Comment: drinks 6-12 beer daily  . Drug Use: No  . Sexual Activity: Yes    Birth Control/ Protection: None   Other Topics Concern  . None   Social History Narrative   Lives in [place] with his sister.   Unemployed, previously worked Surveyor, minerals down [employer]   Completed high school and some college    Patient is right-handed.   Patients one cup of coffee every morning.         Past Surgical History  Procedure Laterality Date  . None    . Carotid angiogram N/A 06/15/2012    Procedure: CAROTID ANGIOGRAM;  Surgeon: Chuck Hint, MD;  Location: [facility] CATH LAB;  Service: Cardiovascular;  Laterality: N/A;  . Cardiac catheterization N/A 03/15/2015    Procedure: Left Heart Cath;  Surgeon: Laurier Nancy, MD;  Location: [facility] INVASIVE CV  LAB;  Service: Cardiovascular;  Laterality: N/A;  . Cardiac catheterization N/A 03/16/2015    Procedure: Coronary Stent Intervention;  Surgeon: Alwyn Peawayne D Callwood, MD;  Location: [facility] INVASIVE CV LAB;  Service: Cardiovascular;  Laterality: N/A;  . Peripheral vascular catheterization Left 04/06/2015    Procedure: Carotid PTA/Stent Intervention;  Surgeon: Annice NeedyJason S Dew, MD;  Location: [facility] INVASIVE CV LAB;  Service: Cardiovascular;  Laterality: Left;  . Esophagogastroduodenoscopy N/A 03/08/2016    Procedure: ESOPHAGOGASTRODUODENOSCOPY (EGD);  Surgeon: Ruffin FrederickSteven Paul Armbruster, MD;  [facility];  Service: Gastroenterology;  Laterality: N/A;   Past Medical History  Diagnosis Date  . Alcohol dependency (HCC)     Hx ETOH withdrawal seizures before 2011   . Pancreatitis 2011....     CT findings in May 2011 with inflammation and pseudocyst.  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/2016  . GERD (gastroesophageal reflux disease)   . CAD (coronary artery disease) 06/13/2012    Calcification noted on CTA of chest in 2012 Wall motion abnormality on ECHO    . 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   Marland Kitchen. Hyperlipidemia   . Headache(784.0)     migraine  . Heart disease 02/2015    PCI/DES placed to RCA: on chronic Plavix/ASA  . Depression with anxiety   . Hypertension   . Carotid artery disease (HCC) 2016    bilateral.  s/p left carotid stent 03/2015  . Renal artery stenosis, native, bilateral (HCC) 02/2015   BP 98/65 mmHg  Pulse 67  Resp 14  SpO2 98%  Opioid Risk Score:   Fall Risk Score:  `1  Depression screen PHQ 2/9  Depression screen [facility] 04/11/2016 12/29/2012 11/25/2012 06/25/2012 02/10/2012 04/01/2011 02/25/2011  Decreased Interest 2 0 0 0 0 1 0  Down, Depressed, Hopeless 3 0 0 0 0 3 0  PHQ - 2 Score 5 0 0 0 0 4 0  Altered sleeping 3 - - - - 1 -  Tired, decreased energy 3 - - - - 1 -  Change in appetite 0 - - - - 1 -  Feeling bad or failure about yourself  1 - - - - 1 -  Trouble concentrating 2 - - - - 0 -  Moving slowly or fidgety/restless 1 - - - - 1 -  Suicidal thoughts 0 - - - - 0 -  PHQ-9 Score 15 - - - - 9 -  Difficult doing work/chores Extremely dIfficult - - - - - -

## 2016-04-11 NOTE — Patient Instructions (Signed)
The patient would follow up with Dr. Adela LankArmbruster, Gastroenterology Services and Dr. Park BreedKahn, medical management.

## 2016-04-11 NOTE — Progress Notes (Addendum)
Subjective:    Patient ID: Brandon [name], male    DOB: [birth year], 50 y.o.   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  DATE OF ADMISSION:  03/12/2016 DATE OF DISCHARGE:  03/29/2016 At discharge he was encouraged to follow up with Neurology, which he had this AM.  He has not made an appointment with GI.  He has not scheduled an appointment with PCP.   He denies driving, smoking, and alcohol. He denies noticing frank bleeding. He does not have a glucometer, so has not checked his CBGs. He continues to have trouble sleeping, but the medication is helping.   DME: Shower chair, toilet seat Mobility: Walker Therapies: Scheduled to start tomorrow  Pain Inventory Average Pain 8 Pain Right Now 5 My pain is dull  In the last 24 hours, has pain interfered with the following? General activity 7 Relation with others 2 Enjoyment of life 7 What TIME of day is your pain at its worst? daytime Sleep (in general) Fair  Pain is worse with: walking, bending, sitting, standing and some activites Pain improves with: nothing Relief from Meds: 0  Mobility walk with assistance use a walker how many minutes can you walk? 20 do you drive?  no Do you have any goals in this area?  yes  Function I need assistance with the following:  dressing, bathing, toileting, meal prep, household duties and shopping  Neuro/Psych weakness numbness tremor tingling trouble walking dizziness confusion depression anxiety  Prior Studies Any changes since last visit?  no  Physicians involved in your care Any changes since last visit?  yes   Family History  Problem Relation Age of Onset  . Stroke Mother     deceased  . Coronary artery disease Mother   . Alcohol abuse Mother   . Cancer  Mother   . Hypertension Father     alive  . Alcohol abuse Father   . Diabetes Father   . Kidney disease Father   . Stroke Maternal Grandmother    Social History   Social History  . Marital Status: Single    Spouse Name: [name]  . Number of Children: 0  . Years of Education: 12   Occupational History  . unemployed     previously worked at [employer] History Main Topics  . Smoking status: Former Smoker -- 1.00 packs/day for 30 years    Types: Cigarettes    Quit date: 04/12/2015  . Smokeless tobacco: Never Used     Comment: smoking less  . Alcohol Use: No     Comment: drinks 6-12 beer daily  . Drug Use: No  . Sexual Activity: Yes    Birth Control/ Protection: None   Other Topics Concern  . None   Social History Narrative   Lives in [place] with his sister.   Unemployed, previously worked Surveyor, minerals down [employer]   Completed high school and some college    Patient is right-handed.   Patients one cup of coffee every morning.         Past Surgical History  Procedure Laterality Date  . None    . Carotid angiogram N/A 06/15/2012    Procedure: CAROTID ANGIOGRAM;  Surgeon: Chuck Hint, MD;  Location: [facility] CATH LAB;  Service: Cardiovascular;  Laterality: N/A;  .  Cardiac catheterization N/A 03/15/2015    Procedure: Left Heart Cath;  Surgeon: Laurier NancyShaukat A Khan, MD;  [facility];  Service: Cardiovascular;  Laterality: N/A;  . Cardiac catheterization N/A 03/16/2015    Procedure: Coronary Stent Intervention;  Surgeon: Alwyn Peawayne D Callwood, MD;  Location: [facility] INVASIVE CV LAB;  Service: Cardiovascular;  Laterality: N/A;  . Peripheral vascular catheterization Left 04/06/2015    Procedure: Carotid PTA/Stent Intervention;  Surgeon: Annice NeedyJason S Dew, MD;  Location: [facility] INVASIVE CV LAB;  Service: Cardiovascular;  Laterality: Left;  . Esophagogastroduodenoscopy N/A 03/08/2016    Procedure: ESOPHAGOGASTRODUODENOSCOPY (EGD);  Surgeon: Ruffin FrederickSteven Paul  Armbruster, MD;  [facility];  Service: Gastroenterology;  Laterality: N/A;   Past Medical History  Diagnosis Date  . Alcohol dependency (HCC)     Hx ETOH withdrawal seizures before 2011  . Pancreatitis 2011....     CT findings in May 2011 with inflammation and pseudocyst.  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/2016  . GERD (gastroesophageal reflux disease)   . CAD (coronary artery disease) 06/13/2012    Calcification noted on CTA of chest in 2012 Wall motion abnormality on ECHO    . 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   Marland Kitchen. Hyperlipidemia   . Headache(784.0)     migraine  . Heart disease 02/2015    PCI/DES placed to RCA: on chronic Plavix/ASA  . Depression with anxiety   . Hypertension   . Carotid artery disease (HCC) 2016    bilateral.  s/p left carotid stent 03/2015  . Renal artery stenosis, native, bilateral (HCC) 02/2015   BP 98/65 mmHg  Pulse 67  Resp 14  SpO2 98%  Opioid Risk Score:   Fall Risk Score:  `1  Depression screen PHQ 2/9  Depression screen [facility] 04/11/2016 12/29/2012 11/25/2012 06/25/2012 02/10/2012 04/01/2011 02/25/2011  Decreased Interest 2 0 0 0 0 1 0  Down, Depressed, Hopeless 3 0 0 0 0 3 0  PHQ - 2 Score 5 0 0 0 0 4 0  Altered sleeping 3 - - - - 1 -  Tired, decreased energy 3 - - - - 1 -  Change in appetite 0 - - - - 1 -  Feeling bad or failure about yourself  1 - - - - 1 -  Trouble concentrating 2 - - - - 0 -  Moving slowly or fidgety/restless 1 - - - - 1 -  Suicidal thoughts 0 - - - - 0 -  PHQ-9 Score 15 - - - - 9 -  Difficult doing work/chores Extremely dIfficult - - - - - -     Review of Systems  Constitutional: Positive for unexpected weight change.  Respiratory: Positive for shortness of breath.   Endocrine:       High blood sugar  Neurological: Positive for dizziness, tremors, weakness and numbness.       Tingling  Psychiatric/Behavioral: Positive for confusion and dysphoric  mood. The patient is nervous/anxious.   All other systems reviewed and are negative.      Objective:   Physical Exam Constitutional: He appears well-developed and well-nourished.   HENT: Normocephalic and atraumatic.   Eyes: EOM and Conj are normal.   Cardiovascular: Normal rate and regular rhythm.    Respiratory: Effort normal and breath sounds normal. No respiratory distress.   GI: Soft. Bowel sounds are normal. He exhibits no distension.  Musculoskeletal: He exhibits no edema or tenderness.  Neurological: He is alert and oriented 3.  Motor: Right upper extremity/right lower extremity: 5/5 proximal to distal Left upper extremity: 4+/5 delt, bicep, wrist, tricep, HI with motor apraxia (improving) Left lower extremity: 4/5 HF, 4-4+/5 KE, 4+/5 ADF/PF with apraxia (improving) No increase in tone noted Skin: Skin is warm and dry.  Psychiatric: He has flat mood and affect. 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   1 Right MCA territory infarct   Therapies to start tomorrow  Cont meds  Cont to follow with Neurology  2. Urinary incontinence  Due to urge as well as lack of awareness  Cont diapers  Pt reluctant to do scheduled voids/BMs  Performing timed voids with caregiver  3. Mood: Buspar 5 mg twice a day  4. Upper GI bleed.   Follow-up with GI  Cont meds   5. Diabetes mellitus.   Cont meds  Pt not checking CBGs, to get machine today.   6. Alcohol tobacco abuse.   Cont abstinence  7.  Sleep Disturbance  Cont meds   8. Post-stroke gait abnormality  Cont walker  Commence therapies  Meds reviewed. Referral reviewed All Questions answered

## 2016-04-11 NOTE — Patient Instructions (Signed)
I had a long d/w patient and his wife about his recent stroke,carotid occlusion risk for recurrent stroke/TIAs, personally independently reviewed imaging studies and stroke evaluation results and answered questions.Continue aspirin 81 mg daily  for secondary stroke prevention and maintain strict control of hypertension with blood pressure goal below 130/90, diabetes with hemoglobin A1c goal below 6.5% and lipids with LDL cholesterol goal below 70 mg/dL. I also advised the patient to eat a healthy diet with plenty of whole grains, cereals, fruits and vegetables, exercise regularly and maintain ideal body weight .I have complimented him on smoking cessation and encouraged him to continue to do so. I advised him to maintain adequate hydration and to discuss with his cardiologist possible dose reduction in [place] due to his low blood pressure .Followup in the future with stroke nurse practitioner in 6 months or call earlier if necessary.  Stroke Prevention Some medical conditions and behaviors are associated with an increased chance of having a stroke. You may prevent a stroke by making healthy choices and managing medical conditions. HOW CAN I REDUCE MY RISK OF HAVING A STROKE?   Stay physically active. Get at least 30 minutes of activity on most or all days.  Do not smoke. It may also be helpful to avoid exposure to secondhand smoke.  Limit alcohol use. Moderate alcohol use is considered to be:  No more than 2 drinks per day for men.  No more than 1 drink per day for nonpregnant women.  Eat healthy foods. This involves:  Eating 5 or more servings of fruits and vegetables a day.  Making dietary changes that address high blood pressure (hypertension), high cholesterol, diabetes, or obesity.  Manage your cholesterol levels.  Making food choices that are high in fiber and low in saturated fat, trans fat, and cholesterol may control cholesterol levels.  Take any prescribed medicines to control  cholesterol as directed by your health care provider.  Manage your diabetes.  Controlling your carbohydrate and sugar intake is recommended to manage diabetes.  Take any prescribed medicines to control diabetes as directed by your health care provider.  Control your hypertension.  Making food choices that are low in salt (sodium), saturated fat, trans fat, and cholesterol is recommended to manage hypertension.  Ask your health care provider if you need treatment to lower your blood pressure. Take any prescribed medicines to control hypertension as directed by your health care provider.  If you are 7518-52 years of age, have your blood pressure checked every 3-5 years. If you are 52 years of age or older, have your blood pressure checked every year.  Maintain a healthy weight.  Reducing calorie intake and making food choices that are low in sodium, saturated fat, trans fat, and cholesterol are recommended to manage weight.  Stop drug abuse.  Avoid taking birth control pills.  Talk to your health care provider about the risks of taking birth control pills if you are over 52 years old, smoke, get migraines, or have ever had a blood clot.  Get evaluated for sleep disorders (sleep apnea).  Talk to your health care provider about getting a sleep evaluation if you snore a lot or have excessive sleepiness.  Take medicines only as directed by your health care provider.  For some people, aspirin or blood thinners (anticoagulants) are helpful in reducing the risk of forming abnormal blood clots that can lead to stroke. If you have the irregular heart rhythm of atrial fibrillation, you should be on a blood thinner unless  there is a good reason you cannot take them.  Understand all your medicine instructions.  Make sure that other conditions (such as anemia or atherosclerosis) are addressed. SEEK IMMEDIATE MEDICAL CARE IF:   You have sudden weakness or numbness of the face, arm, or leg,  especially on one side of the body.  Your face or eyelid droops to one side.  You have sudden confusion.  You have trouble speaking (aphasia) or understanding.  You have sudden trouble seeing in one or both eyes.  You have sudden trouble walking.  You have dizziness.  You have a loss of balance or coordination.  You have a sudden, severe headache with no known cause.  You have new chest pain or an irregular heartbeat. Any of these symptoms may represent a serious problem that is an emergency. Do not wait to see if the symptoms will go away. Get medical help at once. Call your local emergency services (911 in [place] Do not drive yourself to the hospital.   This information is not intended to replace advice given to you by your health care provider. Make sure you discuss any questions you have with your health care provider.   Document Released: 10/17/2004 Document Revised: 09/30/2014 Document Reviewed: 03/12/2013 Elsevier Interactive Patient Education [employer]

## 2016-04-29 ENCOUNTER — Other Ambulatory Visit: Payer: Self-pay | Admitting: Neurology

## 2016-04-30 ENCOUNTER — Other Ambulatory Visit: Payer: Self-pay

## 2016-04-30 MED ORDER — TRAMADOL HCL 50 MG PO TABS: 50.0000 mg | ORAL_TABLET | Freq: Four times a day (QID) | ORAL | 1 refills | Status: DC | PRN

## 2016-07-12 ENCOUNTER — Encounter: Payer: Medicaid Other | Attending: Physical Medicine & Rehabilitation | Admitting: Physical Medicine & Rehabilitation

## 2016-07-12 DIAGNOSIS — I2510 Atherosclerotic heart disease of native coronary artery without angina pectoris: Secondary | ICD-10-CM | POA: Insufficient documentation

## 2016-07-12 DIAGNOSIS — R410 Disorientation, unspecified: Secondary | ICD-10-CM | POA: Insufficient documentation

## 2016-07-12 DIAGNOSIS — R200 Anesthesia of skin: Secondary | ICD-10-CM | POA: Insufficient documentation

## 2016-07-12 DIAGNOSIS — R531 Weakness: Secondary | ICD-10-CM | POA: Insufficient documentation

## 2016-07-12 DIAGNOSIS — Z5189 Encounter for other specified aftercare: Secondary | ICD-10-CM | POA: Insufficient documentation

## 2016-07-12 DIAGNOSIS — R251 Tremor, unspecified: Secondary | ICD-10-CM | POA: Insufficient documentation

## 2016-07-12 DIAGNOSIS — R32 Unspecified urinary incontinence: Secondary | ICD-10-CM | POA: Insufficient documentation

## 2016-07-12 DIAGNOSIS — K219 Gastro-esophageal reflux disease without esophagitis: Secondary | ICD-10-CM | POA: Insufficient documentation

## 2016-07-12 DIAGNOSIS — I213 ST elevation (STEMI) myocardial infarction of unspecified site: Secondary | ICD-10-CM | POA: Insufficient documentation

## 2016-07-12 DIAGNOSIS — R42 Dizziness and giddiness: Secondary | ICD-10-CM | POA: Insufficient documentation

## 2016-07-12 DIAGNOSIS — I69359 Hemiplegia and hemiparesis following cerebral infarction affecting unspecified side: Secondary | ICD-10-CM | POA: Insufficient documentation

## 2016-07-12 DIAGNOSIS — G479 Sleep disorder, unspecified: Secondary | ICD-10-CM | POA: Insufficient documentation

## 2016-07-12 DIAGNOSIS — E785 Hyperlipidemia, unspecified: Secondary | ICD-10-CM | POA: Insufficient documentation

## 2016-07-12 DIAGNOSIS — E119 Type 2 diabetes mellitus without complications: Secondary | ICD-10-CM | POA: Insufficient documentation

## 2016-07-12 DIAGNOSIS — Z87891 Personal history of nicotine dependence: Secondary | ICD-10-CM | POA: Insufficient documentation

## 2016-07-12 DIAGNOSIS — F329 Major depressive disorder, single episode, unspecified: Secondary | ICD-10-CM | POA: Insufficient documentation

## 2016-07-12 DIAGNOSIS — R269 Unspecified abnormalities of gait and mobility: Secondary | ICD-10-CM | POA: Insufficient documentation

## 2016-07-12 DIAGNOSIS — F419 Anxiety disorder, unspecified: Secondary | ICD-10-CM | POA: Insufficient documentation

## 2016-07-15 ENCOUNTER — Other Ambulatory Visit: Payer: Self-pay

## 2016-07-15 MED ORDER — TRAMADOL HCL 50 MG PO TABS: 50.0000 mg | ORAL_TABLET | Freq: Four times a day (QID) | ORAL | 0 refills | Status: DC | PRN

## 2016-07-15 NOTE — Telephone Encounter (Signed)
Rn [facility] about pt needing another refill for his tramadol RX. Jill AlexandersJustin stated pt pick up the first refill on 04/2016 and last one on 06/12/2016. He stated pt has already had his 180 pills since 04/30/2016.Pt is requesting another refill as of today. Rn stated a message will be sent to Dr.Sethi about pt wanting another rx. Tramadol rx will not be sent until Dr. Pearlean BrownieSethi reviewed the chart.

## 2016-07-15 NOTE — Telephone Encounter (Signed)
Rn spoke with Dr Pearlean BrownieSethi about pt needing Tramadol rx again. Pt was prescribed 90 pills on 06/12/2016. Per Dr. Pearlean BrownieSethi if patient is requiring 2 tablets every 6 hours he will need to come in sooner than January 2018. Pt has appt with Carolyn(NP) in January 2018. Pt will be given 90 pills again today. Patient cannot refill till early.

## 2016-07-15 NOTE — Telephone Encounter (Signed)
Tramadol rx sign by Dr.Sethi. If patient is requiring another refill on November 23 he will have to seek an earlier appt. Per Dr. Pearlean BrownieSethi the 90 pills should last him till his next appt in January 2018.

## 2016-09-02 ENCOUNTER — Other Ambulatory Visit: Payer: Self-pay

## 2016-09-02 MED ORDER — TRAMADOL HCL 50 MG PO TABS: 50.0000 mg | ORAL_TABLET | Freq: Four times a day (QID) | ORAL | 0 refills | Status: DC | PRN

## 2016-10-14 ENCOUNTER — Other Ambulatory Visit: Payer: Self-pay | Admitting: Neurology

## 2016-10-15 ENCOUNTER — Ambulatory Visit: Payer: Medicaid Other | Admitting: Nurse Practitioner

## 2016-10-15 ENCOUNTER — Telehealth: Payer: Self-pay | Admitting: *Deleted

## 2016-10-15 NOTE — Telephone Encounter (Signed)
No show - follow up appt.

## 2016-10-16 ENCOUNTER — Encounter: Payer: Self-pay | Admitting: Nurse Practitioner

## 2016-10-24 ENCOUNTER — Other Ambulatory Visit: Payer: Self-pay | Admitting: Neurology

## 2016-10-29 ENCOUNTER — Telehealth: Payer: Self-pay

## 2016-10-29 NOTE — Telephone Encounter (Signed)
[facility] [address] [place] [place] and form sent to pharmacy. Pt was last seen 03/2016. Pt was a no show in January 2018 for appt.

## 2016-12-30 ENCOUNTER — Ambulatory Visit: Payer: Medicaid Other | Admitting: Nurse Practitioner

## 2016-12-31 ENCOUNTER — Encounter: Payer: Self-pay | Admitting: Nurse Practitioner

## 2017-01-20 ENCOUNTER — Ambulatory Visit: Payer: Medicaid Other | Admitting: Nurse Practitioner

## 2017-01-23 ENCOUNTER — Encounter: Payer: Self-pay | Admitting: Nurse Practitioner

## 2017-01-23 ENCOUNTER — Ambulatory Visit (INDEPENDENT_AMBULATORY_CARE_PROVIDER_SITE_OTHER): Payer: Medicaid Other | Admitting: Nurse Practitioner

## 2017-01-23 ENCOUNTER — Encounter (INDEPENDENT_AMBULATORY_CARE_PROVIDER_SITE_OTHER): Payer: Self-pay

## 2017-01-23 VITALS — BP 95/62 | HR 68 | Wt 195.6 lb

## 2017-01-23 DIAGNOSIS — F17200 Nicotine dependence, unspecified, uncomplicated: Secondary | ICD-10-CM

## 2017-01-23 DIAGNOSIS — I10 Essential (primary) hypertension: Secondary | ICD-10-CM

## 2017-01-23 DIAGNOSIS — Z8673 Personal history of transient ischemic attack (TIA), and cerebral infarction without residual deficits: Secondary | ICD-10-CM | POA: Insufficient documentation

## 2017-01-23 DIAGNOSIS — E785 Hyperlipidemia, unspecified: Secondary | ICD-10-CM | POA: Diagnosis not present

## 2017-01-23 NOTE — Patient Instructions (Addendum)
Stressed the importance of management of risk factors to prevent further stroke Continue aspirin for secondary stroke prevention Maintain strict control of hypertension with blood pressure goal below 130/90, today's reading 95/62 continue antihypertensive medications, make sure you are well hydrated discuss with cardiologist regarding reducing [place] Control of diabetes with hemoglobin A1c below 6.5 followed by primary care, continue diabetic medications Cholesterol with LDL cholesterol less than 70, followed by primary care,  Continue Zocor Exercise by walking, slowly increase , eat healthy diet with whole grains,  fresh fruits and vegetables Discharge from neuro clinic Continue follow up with Dr. Franco Nones for stroke risk management  Stroke Prevention Some health problems and behaviors may make it more likely for you to have a stroke. Below are ways to lessen your risk of having a stroke.  Be active for at least 30 minutes on most or all days.  Do not smoke. Try not to be around others who smoke.  Do not drink too much alcohol.  Do not have more than 2 drinks a day if you are a man.  Do not have more than 1 drink a day if you are a woman and are not pregnant.  Eat healthy foods, such as fruits and vegetables. If you were put on a specific diet, follow the diet as told.  Keep your cholesterol levels under control through diet and medicines. Look for foods that are low in saturated fat, trans fat, cholesterol, and are high in fiber.  If you have diabetes, follow all diet plans and take your medicine as told.  Ask your doctor if you need treatment to lower your blood pressure. If you have high blood pressure (hypertension), follow all diet plans and take your medicine as told by your doctor.  If you are 52-65 years old, have your blood pressure checked every 3-5 years. If you are age 41 or older, have your blood pressure checked every year.  Keep a healthy weight. Eat foods that  are low in calories, salt, saturated fat, trans fat, and cholesterol.  Do not take drugs.  Avoid birth control pills, if this applies. Talk to your doctor about the risks of taking birth control pills.  Talk to your doctor if you have sleep problems (sleep apnea).  Take all medicine as told by your doctor.  You may be told to take aspirin or blood thinner medicine. Take this medicine as told by your doctor.  Understand your medicine instructions.  Make sure any other conditions you have are being taken care of. Get help right away if:  You suddenly lose feeling (you feel numb) or have weakness in your face, arm, or leg.  Your face or eyelid hangs down to one side.  You suddenly feel confused.  You have trouble talking (aphasia) or understanding what people are saying.  You suddenly have trouble seeing in one or both eyes.  You suddenly have trouble walking.  You are dizzy.  You lose your balance or your movements are clumsy (uncoordinated).  You suddenly have a very bad headache and you do not know the cause.  You have new chest pain.  Your heart feels like it is fluttering or skipping a beat (irregular heartbeat). Do not wait to see if the symptoms above go away. Get help right away. Call your local emergency services (911 in [place] Do not drive yourself to the hospital. This information is not intended to replace advice given to you by your health care provider. Make sure you  discuss any questions you have with your health care provider. Document Released: 03/10/2012 Document Revised: 02/15/2016 Document Reviewed: 03/12/2013 Elsevier Interactive Patient Education  2017 [employer]

## 2017-01-23 NOTE — Progress Notes (Signed)
[facility]  PATIENT: Brandon SmilingKeith W [name] DOB: 13-Apr-1966   REASON FOR VISIT: routine follow-up for stroke HISTORY FROM:patient    HISTORY OF PRESENT ILLNESS:09/24/12 (PS): Brandon [name] is a 51 year old Caucasian male seen for followup after hospital admission for stroke on 06/10/2012. He presented with sudden onset of left facial droop and slurred speech and left arm and left numbness. He was given IV TPA without complications. He has showed significant improvement except for persistent left-sided numbness. MRI scan of the brain showed only small right parietal and insular cortex infarct. Carotid ultrasound showed heavily calcified right internal carotid artery with occlusion. Left side showed moderate calcification without significant stenosis. CT angiogram showed near occlusion of the right proximal ICA with trickle flow beyond. 2-D echo showed unusual wall motion abnormalities with hypokinesis of the anterior septum. Ejection fraction of 55%. Nuclear cardiac scan showed minimum hypokinesia involving the inferior wall and septum with 51% ejection fraction and no cardiac source of embolism. Patient had cerebral catheter angiogram on 06/15/2012 by Dr. 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 Patient was found to have hyperlipidemia, hypertension and smoking as vascular factors. He was started on aspirin and Plavix and states he has done well. His speech difficulties have improved and facial droop as well. He still has some mild paresthesias in the left hand. 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 He has applied for Medicaid and application is pending. He has not been doing outpatient physical and  occupational therapy as he has no insurance. He has cut back smoking but not completely quit yet. His blood pressure today is 132/96.  10/20/13 (LL): Patient returns for 1 year follow up. Since last visit 1 year ago he has had a moderate recovery but was not able to return to work due to easy fatigueability and cognitive difficulties. Repeat CT angiogram Head and Neck showed no change in 50 % stenosis of the left ICA in the bulb region with occlusion of the right vertebral artery. Complete occlusion of the right ICA at the bulb. Reconstituted flow in the right ICA at the skull base. Flow in both distal vertebral arteries with the right being quite small. No basilar stenosis. No posterior circulation branch vessel stenosis or occlusion. He continues to smoke and reports that he has tried many times to quit. He tried Chantix but due to mood changes and agitation had to stop. He complains of near every day headaches that are incapacitating. They are described as "all-over", not unilateral, pounding in quality with sharp shooting pains coming up from the occipital area. He has tried tramadol for these headaches, with some relief but he takes them more frequently than ordered which has led to stomach irritation.   Update 06/09/14 (LL): Brandon [name] returns to the office for stroke followup. Since last visit, there has not been much change per patient and family member. He continues to have headaches and neck pain.  He states his PCP Lacie Scottsiemeyer can no longer prescribe narcotics and he was referred to a pain clinic and put on Suboxone. He continues to smoke and drink alcohol despite having acute on chronic pancreatitis with multiple pseudocysts, with a flareup in May.  He does not  sleep well.  He is requesting Percocet today. Blood pressure is well controlled, it is 133/78 in the office today. He is tolerating Plavix well with no signs of significant bleeding or bruising. He was not able to tolerate Depakote ER  prescribed at last visit, stating it made him feel "like I was someone else," and quit taking it after 3 days. Update 04/12/2015 : He returns for follow-up after last visit 10 months ago. Is a complaint by his wife. Patient had elective left carotid stent done by Dr. Festus Barren in [place] on 04/06/15 and the procedure went well and in fact the patient has noticed improvement in his mental clarity, he states "" my brain fog has lifted and I can think more clearly.`` He has also noticed improvement in headaches and overall he feels better. He had also recently had a coronary stent by Dr. Welton Flakes in [place] He has had no recurrent stroke or TIA symptoms. He continues to smoke but plans to cut back and smokes now only half pack per day. He states his blood pressure is well controlled and today it is 104/71 in office. He has not had any recent lipid profile checked. She is tolerating aspirin and Plavix without bleeding, bruising or other side effects. Update 04/11/2016 : He returns for follow-up today accompanied by his wife after last visit 1 year ago. He was hospitalized on 03/05/16 with left-sided weakness in the setting of an acute GI bleeding.He was last seen normal by his wife at 3 PM on 03/04/2016, and last heard to be normal by phone at midnight. On 03/05/2016, his son found him at home immobile on the couch covered in urine and stool. The patient was transported by EMS to the ED and exam revealed dense left sided weakness. He was out of the IV tPA and endovascular/interventional time windows. He was unable to get up due to feeling "dizzy" and felt as though he would fall when he "tried to walk". He has mild left sided residual deficits from prior stroke. Per EMS, when they tried to sit him up he lost consciousness and was incontinent again of stool. Patient was not administered IV t-PA secondary to delay in arrival. He was admitted to the stepdown unit for further evaluation and treatment. MRI scan of the brain  showed patchy right MCA infarcts felt to be secondary to failure of collaterals circulation involving the right basal ganglia and insular cortex. CT angiogram of the head and neck confirmed chronic right known right ICA occlusion and right vertebral occlusion but left ICA stent was patent. EEG was normal without seizure activity. LDL cholesterol was 37 mg percent. Hemoglobin A1c was 5.8. Transthoracic echo showed normal ejection fraction without cardiac source of embolism. Patient underwent upper endoscopy by gastroenterologist which did not show an obvious source of bleeding. He was given blood transfusion and his condition was stabilized and gradually improve. He was transferred to inpatient rehabilitation where he stayed for 4 weeks and has recently come home 5 days ago. He is yet awaiting home physical and occupational therapy to begin. Is able to walk with a walker but still has left-sided weakness and left leg is dragging. He has been started back on aspirin 81 mg daily and is tolerating well without any recurrent bleeding. He has quit smoking completely. He has been compliant with his medications. His blood pressure has been running in the low 100 range. UPDATE 05/03/2018CM Brandon [name], 51 year old male returns for follow-up. His last visit to the clinic  was 04/11/2016 .He had an admission in June of last year.MRI scan of the brain showed patchy right MCA infarcts felt to be secondary to failure of collaterals circulation involving the right basal ganglia and insular cortex. CT angiogram of the head and neck confirmed chronic right known right ICA occlusion and right vertebral occlusion but left ICA stent was patent. EEG was normal.e remains on aspirin for secondary stroke prevention without further stroke or TIA symptoms.elective left carotid stent done by Dr. Festus Barren in [place] on 04/06/15.he remains on Zocor for hyperlipidemia. He is also insulin-dependent diabetic but does not check his blood  sugars.He continues to smoke.he says he dropped his bicycle for exercise. He does not work. He returns for reevaluation  REVIEW OF SYSTEMS: Full 14 system review of systems performed and notable only for those listed, all others are neg:  Constitutional: fatigue Cardiovascular: neg Ear/Nose/Throat: neg  Skin: neg Eyes: neg Respiratory: cough, shortness of breath Gastroitestinal: chronic pancreatitis, abdominal pain Hematology/Lymphatic: using bruising Endocrine: neg Musculoskeletal:back pain joint pain Allergy/Immunology: neg Neurological: weakness, memory loss Psychiatric: depression and anxiety Sleep : neg   ALLERGIES: Allergies  Allergen Reactions  . No Known Allergies     HOME MEDICATIONS: Outpatient Medications Prior to Visit  Medication Sig Dispense Refill  . albuterol (PROAIR HFA) 108 (90 Base) MCG/ACT inhaler Inhale 2 puffs into the lungs every 6 (six) hours as needed for wheezing or shortness of breath.     Marland Kitchen aspirin 81 MG tablet Take 81 mg by mouth every morning.     . B Complex Vitamins (VITAMIN-B COMPLEX PO) Take 1 tablet by mouth daily.     . busPIRone (BUSPAR) 5 MG tablet Take 1 tablet (5 mg total) by mouth 2 (two) times daily. 60 tablet 0  . insulin glargine (LANTUS) 100 UNIT/ML injection Inject 0.1 mLs (10 Units total) into the skin daily. 10 mL 11  . metFORMIN (GLUCOPHAGE) 1000 MG tablet Take 1 tablet (1,000 mg total) by mouth 2 (two) times daily with a meal. 60 tablet 0  . Multiple Vitamin (MULTIVITAMIN) tablet Take 1 tablet by mouth daily.    . nadolol (CORGARD) 40 MG tablet Take 1 tablet (40 mg total) by mouth daily. 30 tablet 1  . nicotine (NICODERM CQ - DOSED IN MG/24 HOURS) 21 mg/24hr patch 21 mg patch daily 1 week and 14 mg patch daily 3 weeks then 7 mg patch daily 3 weeks. 28 patch 0  . Omega-3 Fatty Acids (FISH OIL PO) Take 1 capsule by mouth daily.    Marland Kitchen omeprazole (PRILOSEC) 20 MG capsule Take 2 capsules (40 mg total) by mouth daily. 60 capsule 1    . Pancrelipase, Lip-Prot-Amyl, (ZENPEP) 25000 units CPEP Take 2 capsules by mouth 3 (three) times daily with meals. 450 capsule 0  . simvastatin (ZOCOR) 20 MG tablet Take 20 mg by mouth daily at 6 PM.     . tiotropium (SPIRIVA HANDIHALER) 18 MCG inhalation capsule Place 18 mcg into inhaler and inhale daily.     . traZODone (DESYREL) 150 MG tablet Take 1 tablet (150 mg total) by mouth at bedtime. 30 tablet 0  . traMADol (ULTRAM) 50 MG tablet Take 1-2 tablets (50-100 mg total) by mouth every 6 (six) hours as needed. Cannot fill early. Pt given 90 pills. (Patient not taking: Reported on 01/23/2017) 90 tablet 0  . lubiprostone (AMITIZA) 24 MCG capsule Take 24 mcg by mouth as needed for constipation.     No facility-administered medications prior to visit.  PAST MEDICAL HISTORY: Past Medical History:  Diagnosis Date  . Alcohol dependency (HCC)    Hx ETOH withdrawal seizures before 2011  . CAD (coronary artery disease) 06/13/2012   Calcification noted on CTA of chest in 2012 Wall motion abnormality on ECHO    . Carotid artery disease (HCC) 2016   bilateral.  s/p left carotid stent 03/2015  . 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   Marland Kitchen Depression with anxiety   . GERD (gastroesophageal reflux disease)   . Headache(784.0)    migraine  . Heart disease 02/2015   PCI/DES placed to RCA: on chronic Plavix/ASA  . Hyperlipidemia   . Hypertension   . Pancreatitis 2011....    CT findings in May 2011 with inflammation and pseudocyst.  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/2016  . Renal artery stenosis, native, bilateral (HCC) 02/2015    PAST SURGICAL HISTORY: Past Surgical History:  Procedure Laterality Date  . CARDIAC CATHETERIZATION N/A 03/15/2015   Procedure: Left Heart Cath;  Surgeon: Laurier Annella Prowell, MD;  Location: [facility] INVASIVE CV LAB;  Service: Cardiovascular;  Laterality: N/A;  . CARDIAC CATHETERIZATION N/A 03/16/2015   Procedure:  Coronary Stent Intervention;  Surgeon: Alwyn Pea, MD;  Location: [facility] INVASIVE CV LAB;  Service: Cardiovascular;  Laterality: N/A;  . CAROTID ANGIOGRAM N/A 06/15/2012   Procedure: CAROTID ANGIOGRAM;  Surgeon: Chuck Hint, MD;  Location[facility] CATH LAB;  Service: Cardiovascular;  Laterality: N/A;  . ESOPHAGOGASTRODUODENOSCOPY N/A 03/08/2016   Procedure: ESOPHAGOGASTRODUODENOSCOPY (EGD);  Surgeon: Ruffin Frederick, MD;  Location: [facility] ENDOSCOPY;  Service: Gastroenterology;  Laterality: N/A;  . none    . PERIPHERAL VASCULAR CATHETERIZATION Left 04/06/2015   Procedure: Carotid PTA/Stent Intervention;  Surgeon: Annice Needy, MD;  Location: [facility] INVASIVE CV LAB;  Service: Cardiovascular;  Laterality: Left;    FAMILY HISTORY: Family History  Problem Relation Age of Onset  . Stroke Mother     deceased  . Coronary artery disease Mother   . Alcohol abuse Mother   . Cancer Mother   . Hypertension Father     alive  . Alcohol abuse Father   . Diabetes Father   . Kidney disease Father   . Stroke Maternal Grandmother     SOCIAL HISTORY: Social History   Social History  . Marital status: Single    Spouse name: Brandon [name]  . Number of children: 0  . Years of education: 12   Occupational History  . unemployed     previously worked at [employer] History Main Topics  . Smoking status: Current Every Day Smoker    Packs/day: 1.00    Years: 30.00    Types: Cigarettes  . Smokeless tobacco: Never Used     Comment: smoking less  . Alcohol use No     Comment: drinks 6-12 beer daily  . Drug use: No  . Sexual activity: Yes    Birth control/ protection: None   Other Topics Concern  . Not on file   Social History Narrative   Lives in [place] with his sister.   Unemployed, previously worked Surveyor, minerals down [employer]   Completed high school and some college    Patient is right-handed.   Patients one cup of coffee every morning.           PHYSICAL  EXAM  Vitals:   01/23/17 1404  Weight: 195 lb 9.6 oz (88.7 kg)   Body  mass index is 25.81 kg/m.  Generalized: Frail appearing male in no acute distress  Head: normocephalic and atraumatic,. Oropharynx benign  Neck: Supple, no carotid bruits  Cardiac: Regular rate rhythm, no murmur  Musculoskeletal: No deformity   Neurological examination   Mentation: Alert oriented to time, place, history taking. Attention span and concentration appropriate. Recent and remote memory intact.  Follows all commands speech and language fluent.   Cranial nerve II-XII: .Pupils were equal round reactive to light extraocular movements were full, visual field were full on confrontational test. Facial sensation and strength were normal. hearing was intact to finger rubbing bilaterally. Uvula tongue midline. head turning and shoulder shrug were normal and symmetric.Tongue protrusion into cheek strength was normal. Motor: normal bulk and tone, full strength in the BUE, BLE,  Sensory: normal and symmetric to light touch, pinprick, and  Vibration,in the upper and lower extremities Coordination: finger-nose-finger, , no dysmetria Reflexes:Symmetric upper and lower, plantar responses were flexor bilaterally. Gait and Station: Rising up from seated position without assistance, wide based stance,  Stooped posture,, able to perform tiptoe, and heel walking without difficulty. Tandem gait is unsteady no assistive device  DIAGNOSTIC DATA (LABS, IMAGING, TESTING) - I reviewed patient records, labs, notes, testing and imaging myself where available.  Lab Results  Component Value Date   WBC 8.8 03/29/2016   HGB 9.6 (L) 03/29/2016   HCT 30.3 (L) 03/29/2016   MCV 89.6 03/29/2016   PLT 410 (H) 03/29/2016      Component Value Date/Time   NA 138 03/29/2016 0605   K 4.4 03/29/2016 0605   CL 104 03/29/2016 0605   CO2 26 03/29/2016 0605   GLUCOSE 116 (H) 03/29/2016 0605   BUN 12 03/29/2016 0605   CREATININE 0.66  03/29/2016 0605   CREATININE 0.94 02/12/2012 1419   CALCIUM 9.4 03/29/2016 0605   PROT 6.4 (L) 03/13/2016 0430   ALBUMIN 2.4 (L) 03/13/2016 0430   AST 104 (H) 03/13/2016 0430   ALT 210 (H) 03/13/2016 0430   ALKPHOS 117 03/13/2016 0430   BILITOT 0.6 03/13/2016 0430   GFRNONAA >60 03/29/2016 0605   GFRNONAA >89 02/12/2012 1419   GFRAA >60 03/29/2016 0605   GFRAA >89 02/12/2012 1419   Lab Results  Component Value Date   CHOL 74 03/06/2016   HDL 27 (L) 03/06/2016   LDLCALC 37 03/06/2016   TRIG 51 03/06/2016   CHOLHDL 2.7 03/06/2016   Lab Results  Component Value Date   HGBA1C 5.6 03/11/2016   Lab Results  Component Value Date   VITAMINB12 412 11/25/2012      ASSESSMENT AND PLAN Brandon [name] is a 51 year old Caucasian male with a right insular and frontal cortex infarct secondary to embolization from proximal right internal carotid artery dissection with occlusion in the neck with moderate left proximal internal carotid artery focal dissection as well as chronically occluded right vertebral artery. Vascular risk factors of hypertension, cardiac disease, hyperlipidemia and smoking. Admission in June 2017 secondary to right hemispheric infarcts secondary to failure of collaterals circulation in the setting of an acute GI bleed. .Left ICA stent was patent.The patient is a current patient of Dr. Pearlean Brownie  who is out of the office today . This note is sent to the work in doctor.     PLAN: Stressed the importance of management of risk factors to prevent further stroke Continue aspirin for secondary stroke prevention Maintain strict control of hypertension with blood pressure goal below 130/90, today's reading 95/62 continue antihypertensive medications, make  sure you are well hydrated discuss with cardiologist regarding reducing C[place]ard needs follow up appt Control of diabetes with hemoglobin A1c below 6.5 followed by primary care, continue diabetic medications Cholesterol with LDL  cholesterol less than 70, followed by primary care,  Continue Zocor Exercise by walking, slowly increase , eat healthy diet with whole grains,  fresh fruits and vegetables Discharge from neuro clinic Continue follow up with Dr. Franco Nones for stroke risk management Nilda Riggs, [facility], [facility], APRN  [facility] [address], [address] [place], [place] [address] [phone]

## 2017-01-24 NOTE — Progress Notes (Signed)
I have read the note, and I agree with the clinical assessment and plan.  Erianna Jolly A. Dondi Burandt, MD, PhD Certified in Neurology, Clinical Neurophysiology, Sleep Medicine, Pain Medicine and Neuroimaging  [facility] [address], [address] [place], [place] [address] [phone]  

## 2017-01-27 NOTE — Progress Notes (Signed)
Received fax confirmation, office note 01/23/17, to Franco Nonesheryl Lindley, FNP [phone] fx, [phone].

## 2017-07-26 IMAGING — CT CT HEAD W/O CM
4 of 7 series · 15 of 47 positions shown, 17 images · non-contrast
Comparison: MRI 11/04/2013

CLINICAL DATA: Left hemi neglect

EXAM:
CT HEAD WITHOUT CONTRAST
TECHNIQUE: Contiguous axial images were obtained from the base of the skull
through the vertex without intravenous contrast.

[Series 3: head without · axial · non-contrast · 0.43mm/px · z∈[-163,-58]mm · 5 of 33 slices shown, 7 images]
[im 6/33  brain]
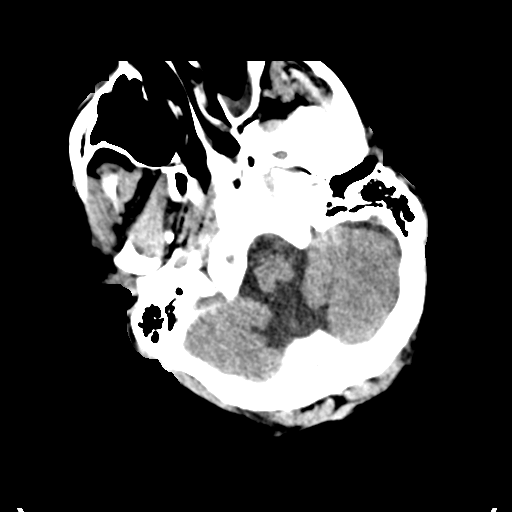
[im 6/33  bone]
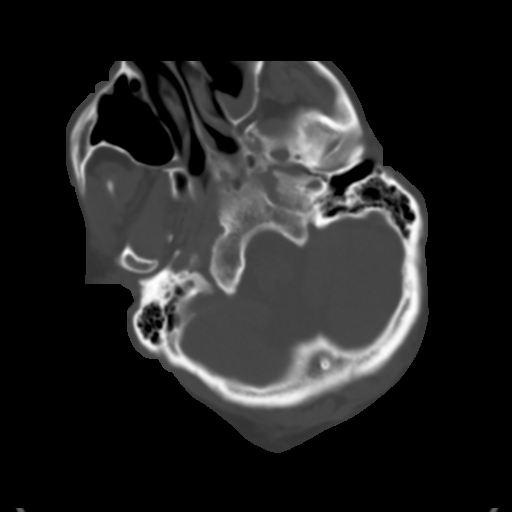
[im 11/33  brain]
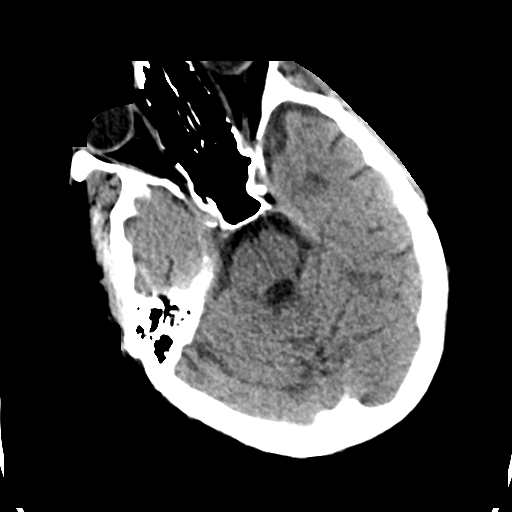
[im 17/33  brain]
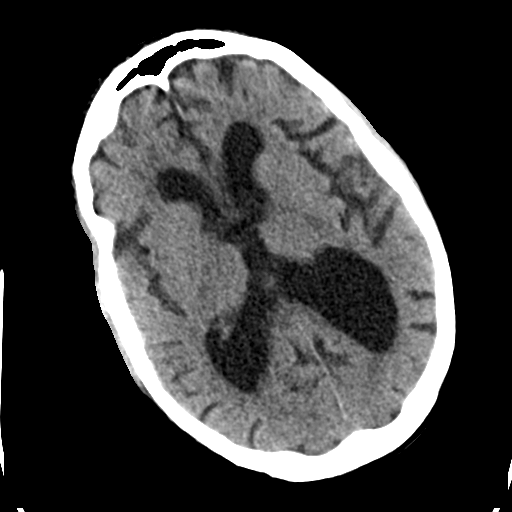
[im 22/33  brain]
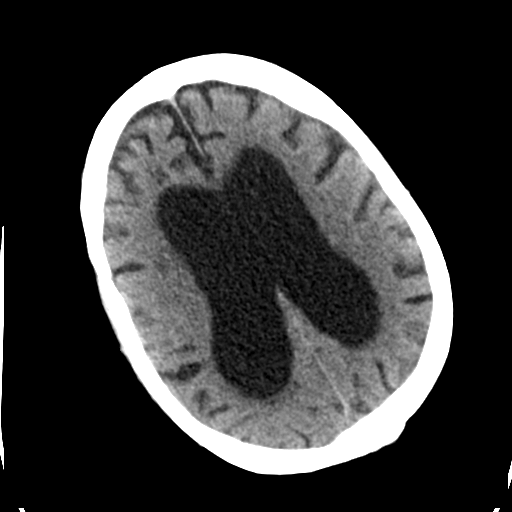
[im 27/33  brain]
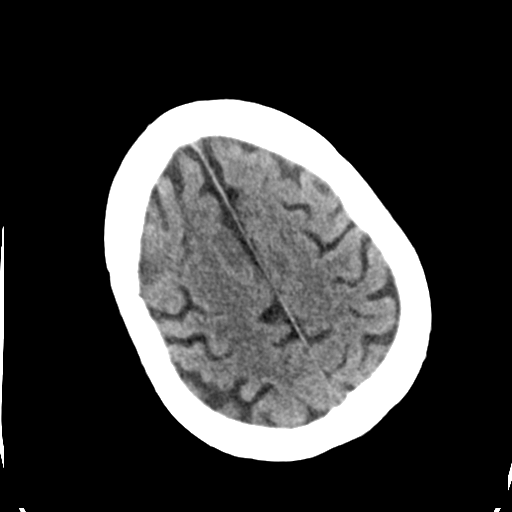
[im 27/33  bone]
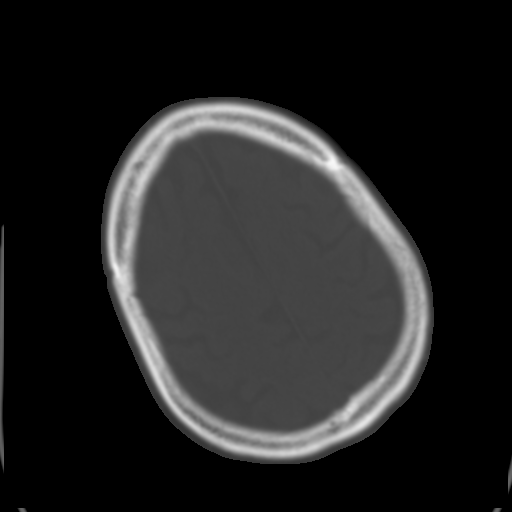

[Series 5: head without sag · sagittal · non-contrast · 0.23mm/px · 3 of 66 slices shown]
[im 14/66  brain]
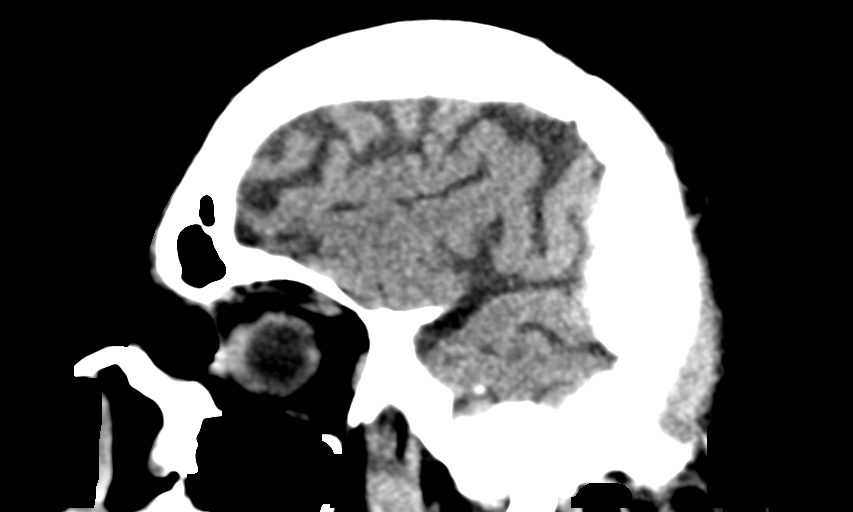
[im 27/66  brain]
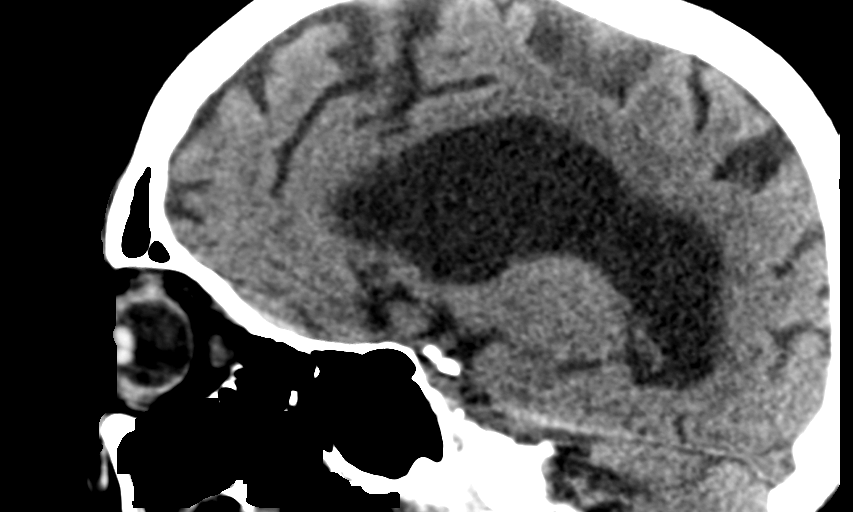
[im 40/66  brain]
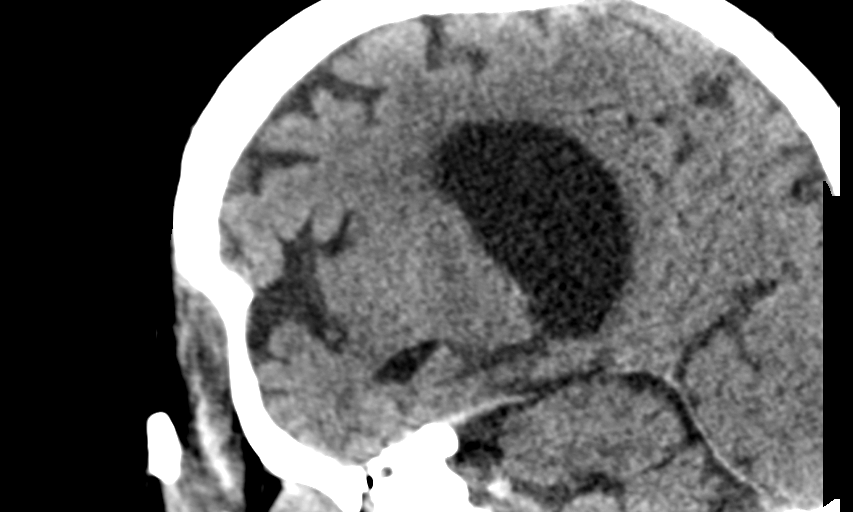

[Series 6: head without cor · coronal · non-contrast · 0.31mm/px · 3 of 68 slices shown]
[im 17/68  brain]
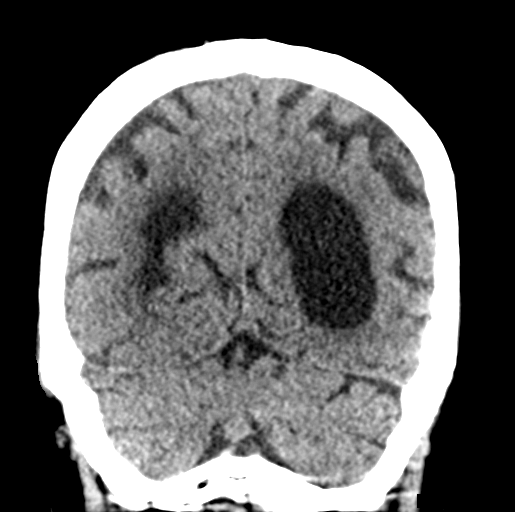
[im 34/68  brain]
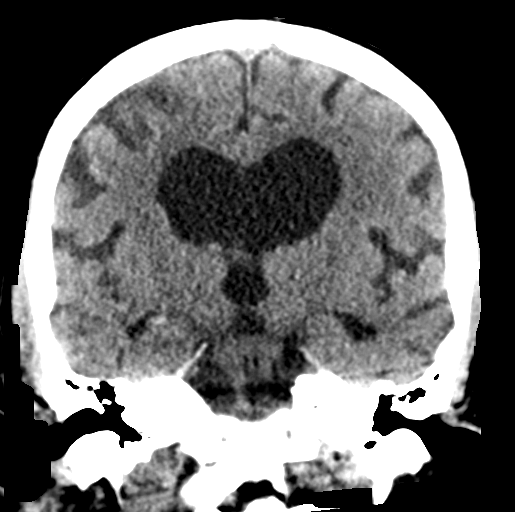
[im 51/68  brain]
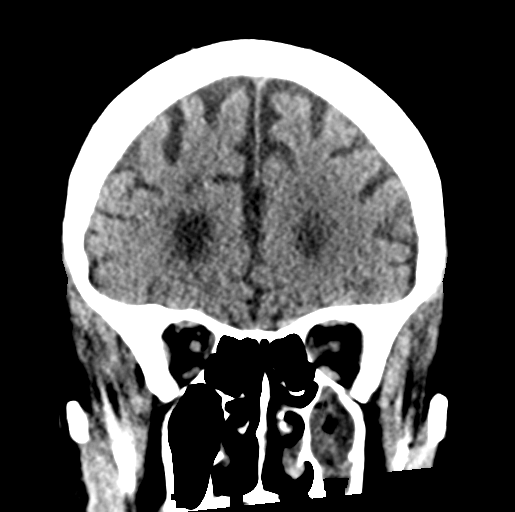

[Series 8: head without ax recon · axial · non-contrast · 0.34mm/px · z∈[-177,-99]mm · 4 of 33 slices shown]
[im 6/33  brain]
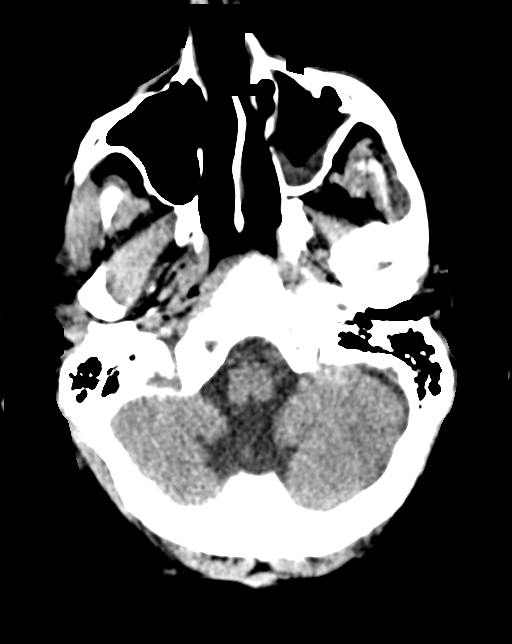
[im 11/33  brain]
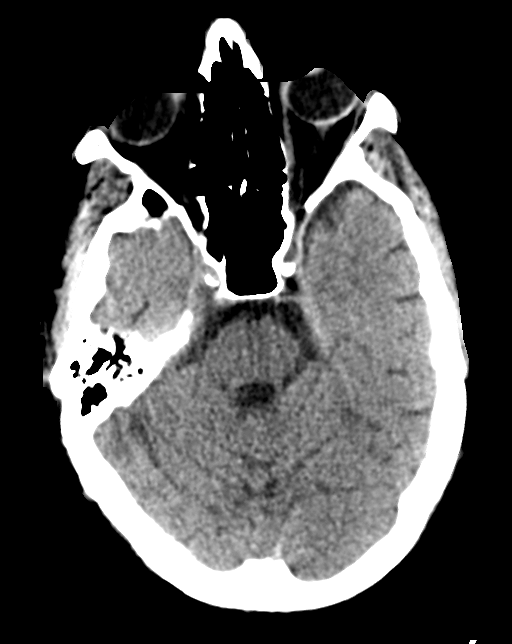
[im 17/33  brain]
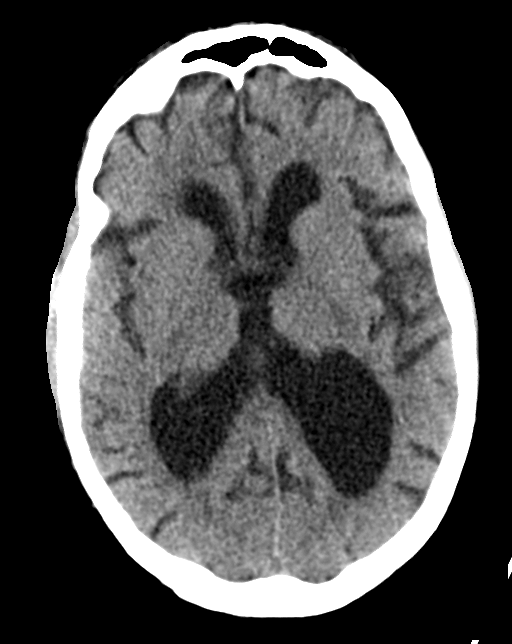
[im 22/33  brain]
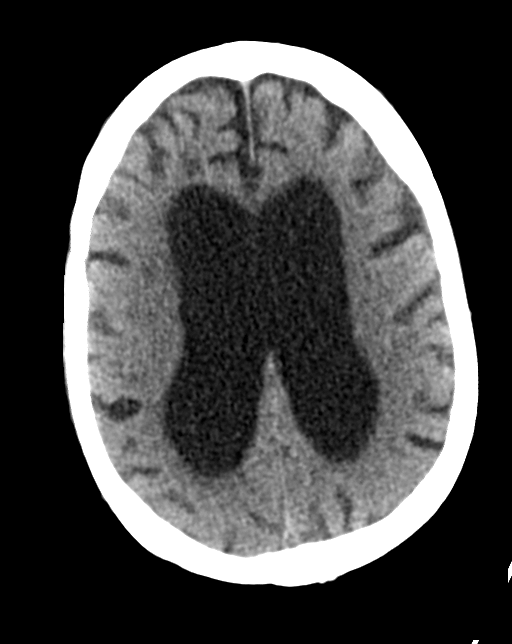

[15 of 47 positions shown; findings below may reference images not displayed]

FINDINGS: Moderate atrophy. Diffuse ventricular enlargement unchanged from the
prior MRI. This may be due to communicating hydrocephalus versus
atrophy.

Ill-defined hypodensity in the high right parietal lobe over the
convexity. This is most likely volume averaging of a prominent
sulcus which appears similar to the prior MRI. Acute infarct
considered less likely given the appearance. Otherwise no acute
infarct. Negative for hemorrhage or mass lesion.
IMPRESSION: Generalized atrophy with moderate ventricular enlargement, stable
from the prior MRI 11/04/2013. Ventricular enlargement raises the
possibility of communicating hydrocephalus versus atrophy

Hypodensity high right frontal lobe most likely volume averaging of
a prominent sulcus.

## 2018-04-13 DIAGNOSIS — R590 Localized enlarged lymph nodes: Secondary | ICD-10-CM | POA: Diagnosis not present

## 2018-04-13 DIAGNOSIS — Z6825 Body mass index (BMI) 25.0-25.9, adult: Secondary | ICD-10-CM | POA: Diagnosis not present

## 2018-04-13 DIAGNOSIS — R627 Adult failure to thrive: Secondary | ICD-10-CM | POA: Diagnosis not present

## 2018-04-13 DIAGNOSIS — K859 Acute pancreatitis without necrosis or infection, unspecified: Secondary | ICD-10-CM | POA: Diagnosis not present

## 2018-04-13 DIAGNOSIS — K760 Fatty (change of) liver, not elsewhere classified: Secondary | ICD-10-CM | POA: Diagnosis not present

## 2018-04-13 DIAGNOSIS — I10 Essential (primary) hypertension: Secondary | ICD-10-CM | POA: Diagnosis not present

## 2018-04-13 DIAGNOSIS — K861 Other chronic pancreatitis: Secondary | ICD-10-CM | POA: Diagnosis not present

## 2018-04-13 DIAGNOSIS — I2510 Atherosclerotic heart disease of native coronary artery without angina pectoris: Secondary | ICD-10-CM | POA: Diagnosis not present

## 2018-04-13 DIAGNOSIS — K219 Gastro-esophageal reflux disease without esophagitis: Secondary | ICD-10-CM | POA: Diagnosis not present

## 2018-04-13 DIAGNOSIS — R933 Abnormal findings on diagnostic imaging of other parts of digestive tract: Secondary | ICD-10-CM | POA: Diagnosis not present

## 2018-04-13 DIAGNOSIS — R634 Abnormal weight loss: Secondary | ICD-10-CM | POA: Diagnosis not present

## 2018-04-13 DIAGNOSIS — R1013 Epigastric pain: Secondary | ICD-10-CM | POA: Diagnosis not present

## 2018-04-13 DIAGNOSIS — Z951 Presence of aortocoronary bypass graft: Secondary | ICD-10-CM | POA: Diagnosis not present

## 2018-04-13 DIAGNOSIS — I864 Gastric varices: Secondary | ICD-10-CM | POA: Diagnosis not present

## 2018-04-13 DIAGNOSIS — E785 Hyperlipidemia, unspecified: Secondary | ICD-10-CM | POA: Diagnosis not present

## 2018-04-13 DIAGNOSIS — Z8673 Personal history of transient ischemic attack (TIA), and cerebral infarction without residual deficits: Secondary | ICD-10-CM | POA: Diagnosis not present

## 2018-06-03 DIAGNOSIS — Z23 Encounter for immunization: Secondary | ICD-10-CM | POA: Diagnosis not present

## 2018-06-03 DIAGNOSIS — R7989 Other specified abnormal findings of blood chemistry: Secondary | ICD-10-CM | POA: Diagnosis not present

## 2018-06-03 DIAGNOSIS — I10 Essential (primary) hypertension: Secondary | ICD-10-CM | POA: Diagnosis not present

## 2018-06-03 DIAGNOSIS — J449 Chronic obstructive pulmonary disease, unspecified: Secondary | ICD-10-CM | POA: Diagnosis not present

## 2018-06-03 DIAGNOSIS — K7030 Alcoholic cirrhosis of liver without ascites: Secondary | ICD-10-CM | POA: Diagnosis not present

## 2018-06-03 DIAGNOSIS — E559 Vitamin D deficiency, unspecified: Secondary | ICD-10-CM | POA: Diagnosis not present

## 2018-06-03 DIAGNOSIS — N400 Enlarged prostate without lower urinary tract symptoms: Secondary | ICD-10-CM | POA: Diagnosis not present

## 2018-06-03 DIAGNOSIS — R5383 Other fatigue: Secondary | ICD-10-CM | POA: Diagnosis not present

## 2018-06-03 DIAGNOSIS — K8689 Other specified diseases of pancreas: Secondary | ICD-10-CM | POA: Diagnosis not present

## 2018-06-03 DIAGNOSIS — E7800 Pure hypercholesterolemia, unspecified: Secondary | ICD-10-CM | POA: Diagnosis not present

## 2018-06-03 DIAGNOSIS — E119 Type 2 diabetes mellitus without complications: Secondary | ICD-10-CM | POA: Diagnosis not present

## 2018-09-09 DIAGNOSIS — E7800 Pure hypercholesterolemia, unspecified: Secondary | ICD-10-CM | POA: Diagnosis not present

## 2018-09-09 DIAGNOSIS — N390 Urinary tract infection, site not specified: Secondary | ICD-10-CM | POA: Diagnosis not present

## 2018-09-09 DIAGNOSIS — R7989 Other specified abnormal findings of blood chemistry: Secondary | ICD-10-CM | POA: Diagnosis not present

## 2018-09-09 DIAGNOSIS — Z20818 Contact with and (suspected) exposure to other bacterial communicable diseases: Secondary | ICD-10-CM | POA: Diagnosis not present

## 2018-09-09 DIAGNOSIS — Z112 Encounter for screening for other bacterial diseases: Secondary | ICD-10-CM | POA: Diagnosis not present

## 2018-09-09 DIAGNOSIS — R300 Dysuria: Secondary | ICD-10-CM | POA: Diagnosis not present

## 2018-09-09 DIAGNOSIS — E559 Vitamin D deficiency, unspecified: Secondary | ICD-10-CM | POA: Diagnosis not present

## 2018-09-09 DIAGNOSIS — Z79899 Other long term (current) drug therapy: Secondary | ICD-10-CM | POA: Diagnosis not present

## 2018-09-09 DIAGNOSIS — R5381 Other malaise: Secondary | ICD-10-CM | POA: Diagnosis not present

## 2018-09-09 DIAGNOSIS — Z118 Encounter for screening for other infectious and parasitic diseases: Secondary | ICD-10-CM | POA: Diagnosis not present

## 2018-09-09 DIAGNOSIS — E119 Type 2 diabetes mellitus without complications: Secondary | ICD-10-CM | POA: Diagnosis not present

## 2018-10-27 DIAGNOSIS — K861 Other chronic pancreatitis: Secondary | ICD-10-CM | POA: Diagnosis not present

## 2018-10-27 DIAGNOSIS — K8689 Other specified diseases of pancreas: Secondary | ICD-10-CM | POA: Diagnosis not present

## 2018-10-27 DIAGNOSIS — K838 Other specified diseases of biliary tract: Secondary | ICD-10-CM | POA: Diagnosis not present

## 2018-10-27 DIAGNOSIS — K7460 Unspecified cirrhosis of liver: Secondary | ICD-10-CM | POA: Diagnosis not present

## 2019-02-22 DIAGNOSIS — N390 Urinary tract infection, site not specified: Secondary | ICD-10-CM | POA: Diagnosis not present

## 2019-02-22 DIAGNOSIS — Z112 Encounter for screening for other bacterial diseases: Secondary | ICD-10-CM | POA: Diagnosis not present

## 2019-02-22 DIAGNOSIS — E7800 Pure hypercholesterolemia, unspecified: Secondary | ICD-10-CM | POA: Diagnosis not present

## 2019-02-22 DIAGNOSIS — E559 Vitamin D deficiency, unspecified: Secondary | ICD-10-CM | POA: Diagnosis not present

## 2019-02-22 DIAGNOSIS — Z118 Encounter for screening for other infectious and parasitic diseases: Secondary | ICD-10-CM | POA: Diagnosis not present

## 2019-02-22 DIAGNOSIS — R300 Dysuria: Secondary | ICD-10-CM | POA: Diagnosis not present

## 2019-02-22 DIAGNOSIS — R011 Cardiac murmur, unspecified: Secondary | ICD-10-CM | POA: Diagnosis not present

## 2019-02-22 DIAGNOSIS — E119 Type 2 diabetes mellitus without complications: Secondary | ICD-10-CM | POA: Diagnosis not present

## 2019-02-22 DIAGNOSIS — Z20818 Contact with and (suspected) exposure to other bacterial communicable diseases: Secondary | ICD-10-CM | POA: Diagnosis not present

## 2019-05-27 DIAGNOSIS — Z79899 Other long term (current) drug therapy: Secondary | ICD-10-CM | POA: Diagnosis not present

## 2019-05-27 DIAGNOSIS — E559 Vitamin D deficiency, unspecified: Secondary | ICD-10-CM | POA: Diagnosis not present

## 2019-05-27 DIAGNOSIS — I10 Essential (primary) hypertension: Secondary | ICD-10-CM | POA: Diagnosis not present

## 2019-05-27 DIAGNOSIS — I340 Nonrheumatic mitral (valve) insufficiency: Secondary | ICD-10-CM | POA: Diagnosis not present

## 2019-05-27 DIAGNOSIS — K860 Alcohol-induced chronic pancreatitis: Secondary | ICD-10-CM | POA: Diagnosis not present

## 2019-05-27 DIAGNOSIS — R110 Nausea: Secondary | ICD-10-CM | POA: Diagnosis not present

## 2019-05-27 DIAGNOSIS — E119 Type 2 diabetes mellitus without complications: Secondary | ICD-10-CM | POA: Diagnosis not present

## 2019-05-27 DIAGNOSIS — E7800 Pure hypercholesterolemia, unspecified: Secondary | ICD-10-CM | POA: Diagnosis not present

## 2019-05-27 DIAGNOSIS — K7030 Alcoholic cirrhosis of liver without ascites: Secondary | ICD-10-CM | POA: Diagnosis not present

## 2019-07-06 DIAGNOSIS — E7800 Pure hypercholesterolemia, unspecified: Secondary | ICD-10-CM | POA: Diagnosis not present

## 2019-07-06 DIAGNOSIS — I10 Essential (primary) hypertension: Secondary | ICD-10-CM | POA: Diagnosis not present

## 2019-07-06 DIAGNOSIS — E119 Type 2 diabetes mellitus without complications: Secondary | ICD-10-CM | POA: Diagnosis not present

## 2019-07-06 DIAGNOSIS — Z23 Encounter for immunization: Secondary | ICD-10-CM | POA: Diagnosis not present

## 2019-07-06 DIAGNOSIS — Z79899 Other long term (current) drug therapy: Secondary | ICD-10-CM | POA: Diagnosis not present

## 2019-07-06 DIAGNOSIS — G4700 Insomnia, unspecified: Secondary | ICD-10-CM | POA: Diagnosis not present

## 2019-07-06 DIAGNOSIS — K8689 Other specified diseases of pancreas: Secondary | ICD-10-CM | POA: Diagnosis not present

## 2019-07-06 DIAGNOSIS — J449 Chronic obstructive pulmonary disease, unspecified: Secondary | ICD-10-CM | POA: Diagnosis not present

## 2019-09-28 DIAGNOSIS — J301 Allergic rhinitis due to pollen: Secondary | ICD-10-CM | POA: Diagnosis not present

## 2019-09-28 DIAGNOSIS — J310 Chronic rhinitis: Secondary | ICD-10-CM | POA: Diagnosis not present

## 2019-09-29 DIAGNOSIS — J310 Chronic rhinitis: Secondary | ICD-10-CM | POA: Diagnosis not present

## 2019-09-29 DIAGNOSIS — J305 Allergic rhinitis due to food: Secondary | ICD-10-CM | POA: Diagnosis not present

## 2019-10-06 DIAGNOSIS — I350 Nonrheumatic aortic (valve) stenosis: Secondary | ICD-10-CM | POA: Diagnosis not present

## 2019-10-06 DIAGNOSIS — E559 Vitamin D deficiency, unspecified: Secondary | ICD-10-CM | POA: Diagnosis not present

## 2019-10-06 DIAGNOSIS — R5381 Other malaise: Secondary | ICD-10-CM | POA: Diagnosis not present

## 2019-10-06 DIAGNOSIS — E119 Type 2 diabetes mellitus without complications: Secondary | ICD-10-CM | POA: Diagnosis not present

## 2019-10-06 DIAGNOSIS — I340 Nonrheumatic mitral (valve) insufficiency: Secondary | ICD-10-CM | POA: Diagnosis not present

## 2019-10-06 DIAGNOSIS — R5383 Other fatigue: Secondary | ICD-10-CM | POA: Diagnosis not present

## 2019-10-06 DIAGNOSIS — E7800 Pure hypercholesterolemia, unspecified: Secondary | ICD-10-CM | POA: Diagnosis not present

## 2019-10-06 DIAGNOSIS — I10 Essential (primary) hypertension: Secondary | ICD-10-CM | POA: Diagnosis not present

## 2020-02-06 ENCOUNTER — Emergency Department (HOSPITAL_COMMUNITY): Payer: Medicaid Other

## 2020-02-06 ENCOUNTER — Other Ambulatory Visit: Payer: Self-pay

## 2020-02-06 ENCOUNTER — Inpatient Hospital Stay (HOSPITAL_COMMUNITY)
Admission: EM | Admit: 2020-02-06 | Discharge: 2020-02-19 | DRG: 177 | Disposition: A | Discharge status: Skilled Nursing Facility | Payer: Medicaid Other | Attending: Internal Medicine | Admitting: Internal Medicine

## 2020-02-06 DIAGNOSIS — E119 Type 2 diabetes mellitus without complications: Secondary | ICD-10-CM

## 2020-02-06 DIAGNOSIS — M6281 Muscle weakness (generalized): Secondary | ICD-10-CM | POA: Diagnosis not present

## 2020-02-06 DIAGNOSIS — Z8249 Family history of ischemic heart disease and other diseases of the circulatory system: Secondary | ICD-10-CM

## 2020-02-06 DIAGNOSIS — R1013 Epigastric pain: Secondary | ICD-10-CM | POA: Diagnosis not present

## 2020-02-06 DIAGNOSIS — K219 Gastro-esophageal reflux disease without esophagitis: Secondary | ICD-10-CM | POA: Diagnosis not present

## 2020-02-06 DIAGNOSIS — G061 Intraspinal abscess and granuloma: Secondary | ICD-10-CM | POA: Diagnosis present

## 2020-02-06 DIAGNOSIS — I6930 Unspecified sequelae of cerebral infarction: Secondary | ICD-10-CM

## 2020-02-06 DIAGNOSIS — Z720 Tobacco use: Secondary | ICD-10-CM | POA: Diagnosis not present

## 2020-02-06 DIAGNOSIS — G062 Extradural and subdural abscess, unspecified: Secondary | ICD-10-CM | POA: Diagnosis not present

## 2020-02-06 DIAGNOSIS — R404 Transient alteration of awareness: Secondary | ICD-10-CM | POA: Diagnosis not present

## 2020-02-06 DIAGNOSIS — E785 Hyperlipidemia, unspecified: Secondary | ICD-10-CM | POA: Diagnosis not present

## 2020-02-06 DIAGNOSIS — F17210 Nicotine dependence, cigarettes, uncomplicated: Secondary | ICD-10-CM | POA: Diagnosis present

## 2020-02-06 DIAGNOSIS — M869 Osteomyelitis, unspecified: Secondary | ICD-10-CM | POA: Diagnosis not present

## 2020-02-06 DIAGNOSIS — R1010 Upper abdominal pain, unspecified: Secondary | ICD-10-CM | POA: Diagnosis not present

## 2020-02-06 DIAGNOSIS — K861 Other chronic pancreatitis: Secondary | ICD-10-CM

## 2020-02-06 DIAGNOSIS — I10 Essential (primary) hypertension: Secondary | ICD-10-CM | POA: Diagnosis present

## 2020-02-06 DIAGNOSIS — R531 Weakness: Secondary | ICD-10-CM | POA: Diagnosis not present

## 2020-02-06 DIAGNOSIS — M4644 Discitis, unspecified, thoracic region: Secondary | ICD-10-CM | POA: Diagnosis not present

## 2020-02-06 DIAGNOSIS — J90 Pleural effusion, not elsewhere classified: Secondary | ICD-10-CM | POA: Diagnosis present

## 2020-02-06 DIAGNOSIS — I69898 Other sequelae of other cerebrovascular disease: Secondary | ICD-10-CM | POA: Diagnosis not present

## 2020-02-06 DIAGNOSIS — J439 Emphysema, unspecified: Secondary | ICD-10-CM | POA: Diagnosis not present

## 2020-02-06 DIAGNOSIS — Z794 Long term (current) use of insulin: Secondary | ICD-10-CM

## 2020-02-06 DIAGNOSIS — F10231 Alcohol dependence with withdrawal delirium: Secondary | ICD-10-CM | POA: Diagnosis present

## 2020-02-06 DIAGNOSIS — M549 Dorsalgia, unspecified: Secondary | ICD-10-CM | POA: Diagnosis not present

## 2020-02-06 DIAGNOSIS — R279 Unspecified lack of coordination: Secondary | ICD-10-CM | POA: Diagnosis not present

## 2020-02-06 DIAGNOSIS — R410 Disorientation, unspecified: Secondary | ICD-10-CM | POA: Diagnosis not present

## 2020-02-06 DIAGNOSIS — M4624 Osteomyelitis of vertebra, thoracic region: Secondary | ICD-10-CM | POA: Diagnosis not present

## 2020-02-06 DIAGNOSIS — F10239 Alcohol dependence with withdrawal, unspecified: Secondary | ICD-10-CM | POA: Diagnosis present

## 2020-02-06 DIAGNOSIS — Z9114 Patient's other noncompliance with medication regimen: Secondary | ICD-10-CM

## 2020-02-06 DIAGNOSIS — K828 Other specified diseases of gallbladder: Secondary | ICD-10-CM | POA: Diagnosis not present

## 2020-02-06 DIAGNOSIS — E1169 Type 2 diabetes mellitus with other specified complication: Secondary | ICD-10-CM | POA: Diagnosis present

## 2020-02-06 DIAGNOSIS — Z823 Family history of stroke: Secondary | ICD-10-CM | POA: Diagnosis not present

## 2020-02-06 DIAGNOSIS — I2510 Atherosclerotic heart disease of native coronary artery without angina pectoris: Secondary | ICD-10-CM | POA: Diagnosis present

## 2020-02-06 DIAGNOSIS — Z79899 Other long term (current) drug therapy: Secondary | ICD-10-CM | POA: Diagnosis not present

## 2020-02-06 DIAGNOSIS — K860 Alcohol-induced chronic pancreatitis: Secondary | ICD-10-CM | POA: Diagnosis not present

## 2020-02-06 DIAGNOSIS — R05 Cough: Secondary | ICD-10-CM | POA: Diagnosis not present

## 2020-02-06 DIAGNOSIS — F1010 Alcohol abuse, uncomplicated: Secondary | ICD-10-CM | POA: Diagnosis not present

## 2020-02-06 DIAGNOSIS — B953 Streptococcus pneumoniae as the cause of diseases classified elsewhere: Secondary | ICD-10-CM | POA: Diagnosis present

## 2020-02-06 DIAGNOSIS — G40909 Epilepsy, unspecified, not intractable, without status epilepticus: Secondary | ICD-10-CM | POA: Diagnosis present

## 2020-02-06 DIAGNOSIS — F418 Other specified anxiety disorders: Secondary | ICD-10-CM | POA: Diagnosis present

## 2020-02-06 DIAGNOSIS — IMO0001 Reserved for inherently not codable concepts without codable children: Secondary | ICD-10-CM | POA: Diagnosis present

## 2020-02-06 DIAGNOSIS — K7030 Alcoholic cirrhosis of liver without ascites: Secondary | ICD-10-CM | POA: Diagnosis not present

## 2020-02-06 DIAGNOSIS — F17200 Nicotine dependence, unspecified, uncomplicated: Secondary | ICD-10-CM | POA: Diagnosis present

## 2020-02-06 DIAGNOSIS — M8628 Subacute osteomyelitis, other site: Secondary | ICD-10-CM | POA: Diagnosis not present

## 2020-02-06 DIAGNOSIS — F1099 Alcohol use, unspecified with unspecified alcohol-induced disorder: Secondary | ICD-10-CM | POA: Diagnosis not present

## 2020-02-06 DIAGNOSIS — M4804 Spinal stenosis, thoracic region: Secondary | ICD-10-CM | POA: Diagnosis present

## 2020-02-06 DIAGNOSIS — R5381 Other malaise: Secondary | ICD-10-CM | POA: Diagnosis not present

## 2020-02-06 DIAGNOSIS — J869 Pyothorax without fistula: Principal | ICD-10-CM

## 2020-02-06 DIAGNOSIS — M4640 Discitis, unspecified, site unspecified: Secondary | ICD-10-CM | POA: Diagnosis not present

## 2020-02-06 DIAGNOSIS — R2689 Other abnormalities of gait and mobility: Secondary | ICD-10-CM | POA: Diagnosis not present

## 2020-02-06 DIAGNOSIS — K859 Acute pancreatitis without necrosis or infection, unspecified: Secondary | ICD-10-CM | POA: Diagnosis present

## 2020-02-06 DIAGNOSIS — F10931 Alcohol use, unspecified with withdrawal delirium: Secondary | ICD-10-CM | POA: Diagnosis present

## 2020-02-06 DIAGNOSIS — F10131 Alcohol abuse with withdrawal delirium: Secondary | ICD-10-CM | POA: Diagnosis not present

## 2020-02-06 DIAGNOSIS — Z7902 Long term (current) use of antithrombotics/antiplatelets: Secondary | ICD-10-CM

## 2020-02-06 DIAGNOSIS — R5383 Other fatigue: Secondary | ICD-10-CM | POA: Diagnosis not present

## 2020-02-06 DIAGNOSIS — Z20822 Contact with and (suspected) exposure to covid-19: Secondary | ICD-10-CM | POA: Diagnosis present

## 2020-02-06 DIAGNOSIS — G4700 Insomnia, unspecified: Secondary | ICD-10-CM

## 2020-02-06 DIAGNOSIS — R82998 Other abnormal findings in urine: Secondary | ICD-10-CM

## 2020-02-06 DIAGNOSIS — G4089 Other seizures: Secondary | ICD-10-CM | POA: Diagnosis not present

## 2020-02-06 DIAGNOSIS — Z7982 Long term (current) use of aspirin: Secondary | ICD-10-CM

## 2020-02-06 DIAGNOSIS — F419 Anxiety disorder, unspecified: Secondary | ICD-10-CM | POA: Diagnosis not present

## 2020-02-06 DIAGNOSIS — R0902 Hypoxemia: Secondary | ICD-10-CM | POA: Diagnosis not present

## 2020-02-06 DIAGNOSIS — Z7951 Long term (current) use of inhaled steroids: Secondary | ICD-10-CM

## 2020-02-06 DIAGNOSIS — R1084 Generalized abdominal pain: Secondary | ICD-10-CM | POA: Diagnosis not present

## 2020-02-06 DIAGNOSIS — Z811 Family history of alcohol abuse and dependence: Secondary | ICD-10-CM

## 2020-02-06 DIAGNOSIS — K8681 Exocrine pancreatic insufficiency: Secondary | ICD-10-CM | POA: Diagnosis present

## 2020-02-06 DIAGNOSIS — F339 Major depressive disorder, recurrent, unspecified: Secondary | ICD-10-CM | POA: Diagnosis not present

## 2020-02-06 LAB — COMPREHENSIVE METABOLIC PANEL
ALT: 12 U/L (ref 0–44)
AST: 13 U/L — ABNORMAL LOW (ref 15–41)
Albumin: 2.6 g/dL — ABNORMAL LOW (ref 3.5–5.0)
Alkaline Phosphatase: 123 U/L (ref 38–126)
Anion gap: 14 (ref 5–15)
BUN: 10 mg/dL (ref 6–20)
CO2: 23 mmol/L (ref 22–32)
Calcium: 8.7 mg/dL — ABNORMAL LOW (ref 8.9–10.3)
Chloride: 100 mmol/L (ref 98–111)
Creatinine, Ser: 0.49 mg/dL — ABNORMAL LOW (ref 0.61–1.24)
GFR calc Af Amer: >60 mL/min (ref >60)
GFR calc non Af Amer: >60 mL/min (ref >60)
Glucose, Bld: 173 mg/dL — ABNORMAL HIGH (ref 70–99)
Potassium: 3.6 mmol/L (ref 3.5–5.1)
Sodium: 137 mmol/L (ref 135–145)
Total Bilirubin: 0.3 mg/dL (ref 0.3–1.2)
Total Protein: 8 g/dL (ref 6.5–8.1)

## 2020-02-06 LAB — URINALYSIS, ROUTINE W REFLEX MICROSCOPIC
Bilirubin Urine: NEGATIVE
Glucose, UA: NEGATIVE mg/dL
Hgb urine dipstick: NEGATIVE
Ketones, ur: 5 mg/dL — AB
Leukocytes,Ua: NEGATIVE
Nitrite: NEGATIVE
Protein, ur: NEGATIVE mg/dL
Specific Gravity, Urine: 1.015 (ref 1.005–1.030)
pH: 5 (ref 5.0–8.0)

## 2020-02-06 LAB — LIPASE, BLOOD: Lipase: 84 U/L — ABNORMAL HIGH (ref 11–51)

## 2020-02-06 LAB — SEDIMENTATION RATE: Sed Rate: 105 mm/hr — ABNORMAL HIGH (ref 0–16)

## 2020-02-06 LAB — CBC
HCT: 35.7 % — ABNORMAL LOW (ref 39.0–52.0)
Hemoglobin: 11.4 g/dL — ABNORMAL LOW (ref 13.0–17.0)
MCH: 31.1 pg (ref 26.0–34.0)
MCHC: 31.9 g/dL (ref 30.0–36.0)
MCV: 97.3 fL (ref 80.0–100.0)
Platelets: 499 10*3/uL — ABNORMAL HIGH (ref 150–400)
RBC: 3.67 MIL/uL — ABNORMAL LOW (ref 4.22–5.81)
RDW: 18 % — ABNORMAL HIGH (ref 11.5–15.5)
WBC: 9.5 10*3/uL (ref 4.0–10.5)
nRBC: 0 % (ref 0.0–0.2)

## 2020-02-06 LAB — SARS CORONAVIRUS 2 BY RT PCR (HOSPITAL ORDER, PERFORMED IN ~~LOC~~ HOSPITAL LAB): SARS Coronavirus 2: NEGATIVE

## 2020-02-06 LAB — C-REACTIVE PROTEIN: CRP: 5 mg/dL — ABNORMAL HIGH (ref <1.0)

## 2020-02-06 MED ORDER — LORAZEPAM 1 MG PO TABS
0.0000 mg | ORAL_TABLET | Freq: Four times a day (QID) | ORAL | Status: AC
  Administered 2020-02-07 (×2): 2 mg via ORAL
  Administered 2020-02-07: 1 mg via ORAL
  Filled 2020-02-06: qty 2
  Filled 2020-02-06 (×3): qty 1
  Filled 2020-02-06: qty 2

## 2020-02-06 MED ORDER — ADULT MULTIVITAMIN W/MINERALS CH
1.0000 | ORAL_TABLET | Freq: Every day | ORAL | Status: DC
  Administered 2020-02-07 – 2020-02-19 (×13): 1 via ORAL
  Filled 2020-02-06 (×13): qty 1

## 2020-02-06 MED ORDER — LORAZEPAM 1 MG PO TABS
0.0000 mg | ORAL_TABLET | Freq: Two times a day (BID) | ORAL | Status: DC
  Filled 2020-02-06: qty 2

## 2020-02-06 MED ORDER — SODIUM CHLORIDE 0.9% FLUSH
3.0000 mL | Freq: Once | INTRAVENOUS | Status: AC
  Administered 2020-02-06: 3 mL via INTRAVENOUS

## 2020-02-06 MED ORDER — SODIUM CHLORIDE 0.9 % IV SOLN: INTRAVENOUS | Status: AC

## 2020-02-06 MED ORDER — DOCUSATE SODIUM 100 MG PO CAPS
100.0000 mg | ORAL_CAPSULE | Freq: Two times a day (BID) | ORAL | Status: DC
  Administered 2020-02-07 – 2020-02-15 (×17): 100 mg via ORAL
  Filled 2020-02-06 (×19): qty 1

## 2020-02-06 MED ORDER — NICOTINE 21 MG/24HR TD PT24
21.0000 mg | MEDICATED_PATCH | Freq: Every day | TRANSDERMAL | Status: DC
  Administered 2020-02-07 – 2020-02-19 (×13): 21 mg via TRANSDERMAL
  Filled 2020-02-06 (×13): qty 1

## 2020-02-06 MED ORDER — LORAZEPAM 2 MG/ML IJ SOLN
0.0000 mg | Freq: Four times a day (QID) | INTRAMUSCULAR | Status: AC
  Administered 2020-02-06: 2 mg via INTRAVENOUS
  Filled 2020-02-06: qty 1

## 2020-02-06 MED ORDER — INSULIN ASPART 100 UNIT/ML ~~LOC~~ SOLN
0.0000 [IU] | SUBCUTANEOUS | Status: DC
  Administered 2020-02-07: 7 [IU] via SUBCUTANEOUS
  Administered 2020-02-07 (×2): 3 [IU] via SUBCUTANEOUS
  Administered 2020-02-08 (×2): 1 [IU] via SUBCUTANEOUS
  Administered 2020-02-08: 5 [IU] via SUBCUTANEOUS
  Administered 2020-02-08: 2 [IU] via SUBCUTANEOUS
  Administered 2020-02-08: 3 [IU] via SUBCUTANEOUS
  Administered 2020-02-09: 5 [IU] via SUBCUTANEOUS
  Administered 2020-02-09: 1 [IU] via SUBCUTANEOUS
  Administered 2020-02-09 (×2): 2 [IU] via SUBCUTANEOUS

## 2020-02-06 MED ORDER — ONDANSETRON HCL 4 MG/2ML IJ SOLN
4.0000 mg | Freq: Four times a day (QID) | INTRAMUSCULAR | Status: DC | PRN
  Administered 2020-02-11: 4 mg via INTRAVENOUS
  Filled 2020-02-06: qty 2

## 2020-02-06 MED ORDER — ALBUTEROL SULFATE (2.5 MG/3ML) 0.083% IN NEBU: 2.5000 mg | INHALATION_SOLUTION | Freq: Four times a day (QID) | RESPIRATORY_TRACT | Status: DC | PRN

## 2020-02-06 MED ORDER — LORAZEPAM 1 MG PO TABS: 1.0000 mg | ORAL_TABLET | ORAL | Status: DC | PRN

## 2020-02-06 MED ORDER — THIAMINE HCL 100 MG/ML IJ SOLN: 100.0000 mg | Freq: Every day | INTRAMUSCULAR | Status: DC

## 2020-02-06 MED ORDER — DIVALPROEX SODIUM ER 500 MG PO TB24
500.0000 mg | ORAL_TABLET | Freq: Every day | ORAL | Status: DC
  Administered 2020-02-07 – 2020-02-19 (×14): 500 mg via ORAL
  Filled 2020-02-06 (×15): qty 1

## 2020-02-06 MED ORDER — HYDROCODONE-ACETAMINOPHEN 5-325 MG PO TABS
1.0000 | ORAL_TABLET | ORAL | Status: DC | PRN
  Administered 2020-02-07 – 2020-02-19 (×35): 2 via ORAL
  Filled 2020-02-06 (×20): qty 2
  Filled 2020-02-06: qty 1
  Filled 2020-02-06 (×17): qty 2

## 2020-02-06 MED ORDER — SODIUM CHLORIDE 0.9% FLUSH
3.0000 mL | Freq: Two times a day (BID) | INTRAVENOUS | Status: DC
  Administered 2020-02-07 – 2020-02-09 (×6): 3 mL via INTRAVENOUS
  Administered 2020-02-09: 10 mL via INTRAVENOUS
  Administered 2020-02-10 – 2020-02-19 (×17): 3 mL via INTRAVENOUS

## 2020-02-06 MED ORDER — FAMOTIDINE 20 MG PO TABS
20.0000 mg | ORAL_TABLET | Freq: Two times a day (BID) | ORAL | Status: DC
  Administered 2020-02-07 – 2020-02-19 (×25): 20 mg via ORAL
  Filled 2020-02-06 (×26): qty 1

## 2020-02-06 MED ORDER — ACETAMINOPHEN 325 MG PO TABS
650.0000 mg | ORAL_TABLET | Freq: Four times a day (QID) | ORAL | Status: DC | PRN
  Administered 2020-02-08 – 2020-02-11 (×4): 650 mg via ORAL
  Filled 2020-02-06 (×4): qty 2

## 2020-02-06 MED ORDER — LORAZEPAM 2 MG/ML IJ SOLN: 1.0000 mg | INTRAMUSCULAR | Status: DC | PRN

## 2020-02-06 MED ORDER — SODIUM CHLORIDE 0.9 % IV BOLUS
1000.0000 mL | Freq: Once | INTRAVENOUS | Status: AC
  Administered 2020-02-06: 1000 mL via INTRAVENOUS

## 2020-02-06 MED ORDER — IOHEXOL 300 MG/ML  SOLN
100.0000 mL | Freq: Once | INTRAMUSCULAR | Status: AC | PRN
  Administered 2020-02-06: 100 mL via INTRAVENOUS

## 2020-02-06 MED ORDER — SIMVASTATIN 20 MG PO TABS
20.0000 mg | ORAL_TABLET | Freq: Every day | ORAL | Status: DC
  Administered 2020-02-07 – 2020-02-18 (×12): 20 mg via ORAL
  Filled 2020-02-06 (×12): qty 1

## 2020-02-06 MED ORDER — TRAZODONE HCL 150 MG PO TABS
150.0000 mg | ORAL_TABLET | Freq: Every day | ORAL | Status: DC
  Administered 2020-02-07 – 2020-02-18 (×13): 150 mg via ORAL
  Filled 2020-02-06 (×13): qty 1

## 2020-02-06 MED ORDER — ACETAMINOPHEN 650 MG RE SUPP: 650.0000 mg | Freq: Four times a day (QID) | RECTAL | Status: DC | PRN

## 2020-02-06 MED ORDER — GADOBUTROL 1 MMOL/ML IV SOLN
8.0000 mL | Freq: Once | INTRAVENOUS | Status: AC | PRN
  Administered 2020-02-06: 8 mL via INTRAVENOUS

## 2020-02-06 MED ORDER — LORAZEPAM 2 MG/ML IJ SOLN
0.0000 mg | Freq: Two times a day (BID) | INTRAMUSCULAR | Status: DC
  Administered 2020-02-09: 2 mg via INTRAVENOUS
  Filled 2020-02-06: qty 1

## 2020-02-06 MED ORDER — THIAMINE HCL 100 MG PO TABS
100.0000 mg | ORAL_TABLET | Freq: Every day | ORAL | Status: DC
  Administered 2020-02-06 – 2020-02-19 (×14): 100 mg via ORAL
  Filled 2020-02-06 (×14): qty 1

## 2020-02-06 MED ORDER — FOLIC ACID 1 MG PO TABS
1.0000 mg | ORAL_TABLET | Freq: Every day | ORAL | Status: DC
  Administered 2020-02-07 – 2020-02-19 (×14): 1 mg via ORAL
  Filled 2020-02-06 (×15): qty 1

## 2020-02-06 MED ORDER — ONDANSETRON HCL 4 MG PO TABS: 4.0000 mg | ORAL_TABLET | Freq: Four times a day (QID) | ORAL | Status: DC | PRN

## 2020-02-06 MED ORDER — ALBUTEROL SULFATE HFA 108 (90 BASE) MCG/ACT IN AERS
2.0000 | INHALATION_SPRAY | Freq: Four times a day (QID) | RESPIRATORY_TRACT | Status: DC | PRN
  Filled 2020-02-06: qty 6.7

## 2020-02-06 MED ORDER — NICOTINE 14 MG/24HR TD PT24
14.0000 mg | MEDICATED_PATCH | Freq: Once | TRANSDERMAL | Status: AC
  Administered 2020-02-06: 14 mg via TRANSDERMAL
  Filled 2020-02-06: qty 1

## 2020-02-06 MED ORDER — THIAMINE HCL 100 MG/ML IJ SOLN
100.0000 mg | Freq: Every day | INTRAMUSCULAR | Status: DC
  Filled 2020-02-06: qty 2

## 2020-02-06 MED ORDER — OXYCODONE HCL 5 MG PO TABS
5.0000 mg | ORAL_TABLET | Freq: Once | ORAL | Status: AC
  Administered 2020-02-06: 5 mg via ORAL
  Filled 2020-02-06: qty 1

## 2020-02-06 NOTE — ED Triage Notes (Signed)
CBG per EMS -130

## 2020-02-06 NOTE — ED Provider Notes (Addendum)
[facility] EMERGENCY DEPARTMENT Provider Note   CSN: [id number] Arrival date & time: 02/06/20  1137     History Chief Complaint  Patient presents with  . Abdominal Pain  . Weakness    Brandon [name] is a 54 y.o. male.  The history is provided by the patient and medical records.  Abdominal Pain Pain location:  Epigastric Pain quality: aching   Pain radiates to:  Does not radiate Pain severity:  Moderate Onset quality:  Gradual Duration:  3 days Timing:  Constant Progression:  Unchanged Chronicity:  Recurrent Context: alcohol use   Context: not trauma   Relieved by:  Nothing Ineffective treatments:  None tried Associated symptoms: chills, cough, fatigue, nausea and vomiting   Associated symptoms: no chest pain, no constipation, no diarrhea, no dysuria, no fever and no shortness of breath   Risk factors: alcohol abuse        Past Medical History:  Diagnosis Date  . Alcohol dependency (HCC)    Hx ETOH withdrawal seizures before 2011  . CAD (coronary artery disease) 06/13/2012   Calcification noted on CTA of chest in 2012 Wall motion abnormality on ECHO    . Carotid artery disease (HCC) 2016   bilateral.  s/p left carotid stent 03/2015  . 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   Marland Kitchen Depression with anxiety   . GERD (gastroesophageal reflux disease)   . Headache(784.0)    migraine  . Heart disease 02/2015   PCI/DES placed to RCA: on chronic Plavix/ASA  . Hyperlipidemia   . Hypertension   . Pancreatitis 2011....    CT findings in May 2011 with inflammation and pseudocyst.  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/2016  . Renal artery stenosis, native, bilateral (HCC) 02/2015    Patient Active Problem List   Diagnosis Date Noted  . History of stroke 01/23/2017  . Dry eyes   . Sleep disturbance   . Essential hypertension, benign   . Upper GI bleed   . 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 Fatty liver   . Tobacco abuse   . Diabetes mellitus type 2 in nonobese (HCC)   . History of CVA with residual deficit   . Acute lower UTI   . Tachycardia   . Chronic alcoholic pancreatitis (HCC)   . Hyponatremia 03/09/2016  . 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  . Acute blood loss anemia 03/09/2016  . Gastric varices   . Alcohol abuse   . Left-sided neglect   . Severe anemia   . UGIB (upper gastrointestinal bleed)   . Pressure ulcer 03/06/2016  . Acute encephalopathy   . CVA (cerebral infarction) 03/05/2016  . Carotid stenosis 04/06/2015  . CAD in native artery 03/16/2015  . Unstable angina pectoris (HCC) 03/15/2015  . Neck pain 10/20/2013  . Insomnia 12/29/2012  . 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 Occlusion and stenosis of carotid artery with cerebral infarction 12/11/2012  . Unspecified cerebral artery occlusion with cerebral infarction 12/11/2012  . Fatigue 11/26/2012  . Hallux valgus 06/25/2012  . Preventative health care 06/25/2012  . Alcohol Dependence 06/18/2012  . Smoking 06/16/2012  . Bilateral extracranial carotid artery stenosis   . Coronary Artery Disease 06/13/2012  . Ischemic Stroke 06/13/2012  . Hyperlipidemia   . Hypertension 02/25/2011  . GERD (gastroesophageal reflux disease) 02/25/2011  . Chronic Pancreatitis. 02/25/2011  . Hepatic steatosis 02/25/2011  Past Surgical History:  Procedure Laterality Date  . CARDIAC CATHETERIZATION N/A 03/15/2015   Procedure: Left Heart Cath;  Surgeon: Dionisio David, MD;  Location: [place] CV [facility];  Service: Cardiovascular;  Laterality: N/A;  . CARDIAC CATHETERIZATION N/A 03/16/2015   Procedure: Coronary Stent Intervention;  Surgeon: Yolonda Kida, MD;  Location: [place] [facility];  Service: Cardiovascular;  Laterality: N/A;  . CAROTID ANGIOGRAM N/A 06/15/2012   Procedure: CAROTID ANGIOGRAM;  Surgeon: Angelia Mould, MD;   Location: [facility] CATH LAB;  Service: Cardiovascular;  Laterality: N/A;  . ESOPHAGOGASTRODUODENOSCOPY N/A 03/08/2016   Procedure: ESOPHAGOGASTRODUODENOSCOPY (EGD);  Surgeon: Manus Gunning, MD;  Location: [place];  Service: Gastroenterology;  Laterality: N/A;  . none    . PERIPHERAL VASCULAR CATHETERIZATION Left 04/06/2015   Procedure: Carotid PTA/Stent Intervention;  Surgeon: Algernon Huxley, MD;  Location: [place] [facility];  Service: Cardiovascular;  Laterality: Left;       Family History  Problem Relation Age of Onset  . Stroke Mother        deceased  . Coronary artery disease Mother   . Alcohol abuse Mother   . Cancer Mother   . Hypertension Father        alive  . Alcohol abuse Father   . Diabetes Father   . Kidney disease Father   . Stroke Maternal Grandmother     Social History   Tobacco Use  . Smoking status: Current Every Day Smoker    Packs/day: 1.00    Years: 30.00    Pack years: 30.00    Types: Cigarettes  . Smokeless tobacco: Never Used  . Tobacco comment: smoking less  Substance Use Topics  . Alcohol use: No    Alcohol/week: 6.0 standard drinks    Types: 6 Cans of beer per week    Comment: drinks 6-12 beer daily  . Drug use: No    Home Medications Prior to Admission medications   Medication Sig Start Date End Date Taking? Authorizing Provider  albuterol (PROAIR HFA) 108 (90 Base) MCG/ACT inhaler Inhale 2 puffs into the lungs every 6 (six) hours as needed for wheezing or shortness of breath.     [provider]  aspirin 81 MG tablet Take 81 mg by mouth every morning.     [provider]  B Complex Vitamins (VITAMIN-B COMPLEX PO) Take 1 tablet by mouth daily.     [provider]  busPIRone (BUSPAR) 5 MG tablet Take 1 tablet (5 mg total) by mouth 2 (two) times daily. 03/29/16   Angiulli, Lavon Paganini, PA-C  clopidogrel (PLAVIX) 75 MG tablet Take by mouth.    [provider]  cyclobenzaprine (FLEXERIL) 10 MG tablet Take  10 mg by mouth every 12 (twelve) hours. 01/08/17   [provider]  divalproex (DEPAKOTE ER) 500 MG 24 hr tablet Take by mouth.    [provider]  famotidine (PEPCID) 20 MG tablet Take 20 mg by mouth 2 (two) times daily as needed. 01/08/17   [provider]  hydrochlorothiazide (MICROZIDE) 12.5 MG capsule Take by mouth daily. 11/21/16   [provider]  insulin glargine (LANTUS) 100 UNIT/ML injection Inject 0.1 mLs (10 Units total) into the skin daily. 03/29/16   Angiulli, Lavon Paganini, PA-C  lisinopril (PRINIVIL,ZESTRIL) 10 MG tablet 1 TAB(S) ORAL EVERY DAY 01/12/17   [provider]  lubiprostone (AMITIZA) 24 MCG capsule Take by mouth.    [provider]  metFORMIN (GLUCOPHAGE) 1000 MG tablet  Take 1 tablet (1,000 mg total) by mouth 2 (two) times daily with a meal. 03/29/16   Angiulli, Mcarthur Rossetti, PA-C  Multiple Vitamin (MULTIVITAMIN) tablet Take 1 tablet by mouth daily.    [provider]  nadolol (CORGARD) 40 MG tablet Take 1 tablet (40 mg total) by mouth daily. 03/29/16   Angiulli, Mcarthur Rossetti, PA-C  nicotine (NICODERM CQ - DOSED IN MG/24 HOURS) 21 mg/24hr patch 21 mg patch daily 1 week and 14 mg patch daily 3 weeks then 7 mg patch daily 3 weeks. 03/29/16   Angiulli, Mcarthur Rossetti, PA-C  Omega-3 Fatty Acids (FISH OIL PO) Take 1 capsule by mouth daily.    [provider]  omeprazole (PRILOSEC) 20 MG capsule Take 2 capsules (40 mg total) by mouth daily. 03/12/16   Vassie Loll, MD  Pancrelipase, Lip-Prot-Amyl, (ZENPEP) 25000 units CPEP Take 2 capsules by mouth 3 (three) times daily with meals. 03/29/16   Angiulli, Mcarthur Rossetti, PA-C  simvastatin (ZOCOR) 20 MG tablet Take 20 mg by mouth daily at 6 PM.     [provider]  tiotropium (SPIRIVA HANDIHALER) 18 MCG inhalation capsule Place 18 mcg into inhaler and inhale daily.     [provider]  traMADol (ULTRAM) 50 MG tablet Take 1-2 tablets (50-100 mg total) by mouth every 6 (six) hours as  needed. Cannot fill early. Pt given 90 pills. Patient not taking: Reported on 01/23/2017 09/02/16   Micki Riley, MD  traZODone (DESYREL) 150 MG tablet Take 1 tablet (150 mg total) by mouth at bedtime. 03/29/16   Angiulli, Mcarthur Rossetti, PA-C    Allergies    No known allergies  Review of Systems   Review of Systems  Constitutional: Positive for chills, diaphoresis and fatigue. Negative for fever.  HENT: Negative for congestion.   Eyes: Negative for visual disturbance.  Respiratory: Positive for cough. Negative for choking, chest tightness and shortness of breath.   Cardiovascular: Negative for chest pain, palpitations and leg swelling.  Gastrointestinal: Positive for abdominal pain, nausea and vomiting. Negative for constipation and diarrhea.  Genitourinary: Negative for dysuria, flank pain and frequency.  Musculoskeletal: Negative for back pain (not reported initailly), neck pain and neck stiffness.  Skin: Negative for rash and wound.  Neurological: Negative for dizziness, light-headedness and headaches.  Psychiatric/Behavioral: Negative for agitation.  All other systems reviewed and are negative.   Physical Exam Updated Vital Signs BP (!) 127/107 (BP Location: Right Arm)   Pulse 95   Temp 97.8 F (36.6 C) (Oral)   Resp 18   SpO2 98%   Physical Exam Vitals and nursing note reviewed.  Constitutional:      General: He is not in acute distress.    Appearance: He is well-developed. He is ill-appearing. He is not toxic-appearing or diaphoretic.  HENT:     Head: Normocephalic and atraumatic.     Right Ear: External ear normal.     Left Ear: External ear normal.     Nose: Nose normal.     Mouth/Throat:     Mouth: Mucous membranes are moist.     Pharynx: No pharyngeal swelling or oropharyngeal exudate.  Eyes:     General: No scleral icterus.    Extraocular Movements: Extraocular movements intact.     Conjunctiva/sclera: Conjunctivae normal.     Pupils: Pupils are equal, round,  and reactive to light.  Cardiovascular:     Rate and Rhythm: Regular rhythm. Tachycardia present.     Heart sounds: Normal heart sounds.  No murmur.  Pulmonary:     Effort: Pulmonary effort is normal. No respiratory distress.     Breath sounds: No stridor. Rhonchi present. No wheezing or rales.  Chest:     Chest wall: No tenderness.  Abdominal:     General: Abdomen is flat. Bowel sounds are normal. There is no distension.     Palpations: Abdomen is soft.     Tenderness: There is abdominal tenderness in the right upper quadrant, epigastric area and left upper quadrant. There is no right CVA tenderness, left CVA tenderness, guarding or rebound.  Musculoskeletal:     Cervical back: Normal range of motion and neck supple.  Skin:    General: Skin is warm.     Findings: No erythema or rash.  Neurological:     General: No focal deficit present.     Mental Status: He is alert and oriented to person, place, and time.     Cranial Nerves: No cranial nerve deficit.     Motor: No abnormal muscle tone.     Coordination: Coordination normal.     Deep Tendon Reflexes: Reflexes normal.  Psychiatric:        Mood and Affect: Mood normal.     ED Results / Procedures / Treatments   Labs (all labs ordered are listed, but only abnormal results are displayed) Labs Reviewed  LIPASE, BLOOD - Abnormal; Notable for the following components:      Result Value   Lipase 84 (*)    All other components within normal limits  COMPREHENSIVE METABOLIC PANEL - Abnormal; Notable for the following components:   Glucose, Bld 173 (*)    Creatinine, Ser 0.49 (*)    Calcium 8.7 (*)    Albumin 2.6 (*)    AST 13 (*)    All other components within normal limits  CBC - Abnormal; Notable for the following components:   RBC 3.67 (*)    Hemoglobin 11.4 (*)    HCT 35.7 (*)    RDW 18.0 (*)    Platelets 499 (*)    All other components within normal limits  URINALYSIS, ROUTINE W REFLEX MICROSCOPIC - Abnormal; Notable  for the following components:   Ketones, ur 5 (*)    All other components within normal limits  URINE CULTURE    EKG None  Radiology No results found.  Procedures Procedures (including critical care time)  Medications Ordered in ED Medications  nicotine (NICODERM CQ - dosed in mg/24 hours) patch 14 mg (14 mg Transdermal Patch Applied 02/06/20 1420)  sodium chloride flush (NS) 0.9 % injection 3 mL (3 mLs Intravenous Given 02/06/20 1433)  sodium chloride 0.9 % bolus 1,000 mL (0 mLs Intravenous Stopped 02/06/20 1648)  oxyCODONE (Oxy IR/ROXICODONE) immediate release tablet 5 mg (5 mg Oral Given 02/06/20 1353)  iohexol (OMNIPAQUE) 300 MG/ML solution 100 mL (100 mLs Intravenous Contrast Given 02/06/20 1540)    ED Course  I have reviewed the triage vital signs and the nursing notes.  Pertinent labs & imaging results that were available during my care of the patient were reviewed by me and considered in my medical decision making (see chart for details).    MDM Rules/Calculators/A&P                      Marcelle SmilingKeith W Wanamaker is a 54 y.o. male with a past medical history significant for hypertension, hyperlipidemia, prior stroke, CAD, alcohol abuse, and chronic alcoholic pancreatitis who presents with abdominal pain,  fatigue, chills, and cough.  Patient reports that for the last few days, he has been having pain in his upper abdomen which she attributes to his prior pancreatitis.  He does report he has been continue to drink around a pint of liquor a day including 1 shot of alcohol before coming this morning.  He does not think he is withdrawing.  He reports no significant nausea and vomiting and denies any changes with his bowels.  Denies constipation or diarrhea.  He does report his urine has been darker in feeling stronger.  He does think he is slightly more fatigued and confused than his baseline.  He denies any significant back pain on my initial evaluation.  On exam, patient is slightly  tachycardic and diaphoretic.  He reports he thinks this is all his pancreatitis.  Abdomen was tender in the upper abdomen but no lower abdominal tenderness.  Lungs had coarse breath sounds bilaterally and he did have a loud cough.  No significant murmur was appreciated.  Chest is nontender.  Good pulses in extremities.  Due to his history, we will get screening labs, give him fluids, give him some pain medicine, and look for underlying abnormalities.  We will get chest x-ray due to this loud cough.  Care transferred to Dr. Madilyn Hook while waiting for results of his work-up.  Anticipate follow-up on labs and imaging to determine disposition.  If work-up is currently reassuring, he may be stable for discharge home, however if significant abnormalities are discovered or concerning infections are discovered, anticipate disposition based on these findings.  Care transferred in stable condition.  Final Clinical Impression(s) / ED Diagnoses Final diagnoses:  Pain of upper abdomen  Dark urine  Malaise and fatigue    Clinical Impression: 1. Pain of upper abdomen   2. Dark urine   3. Malaise and fatigue     Disposition: Care transferred to Dr. Madilyn Hook while waiting for work-up to be completed to to help determine disposition.  This note was prepared with assistance of Conservation officer, historic buildings. Occasional wrong-word or sound-a-like substitutions may have occurred due to the inherent limitations of voice recognition software.      Joniece Smotherman, Canary Brim, MD 02/06/20 2220    Peggie Hornak, Canary Brim, MD 02/06/20 2221

## 2020-02-06 NOTE — ED Triage Notes (Signed)
  Ems stated, He is here for upper left abd. Pain, painful to palpation. And having weakness. Has a history of pancreatitis and thinks its that. His weakness is strong enough that unable to get up to urinate. Hx, of UTI and has a strong odor of urine. Feels a little confused right now. AO 3

## 2020-02-06 NOTE — ED Notes (Signed)
Pt to MRI

## 2020-02-06 NOTE — H&P (Signed)
Brandon [name] DOB: 07/12/1966 DOA: 02/06/2020     PCP: Armando Gang, FNP   Outpatient Specialists:  Sandria Manly at [facility]    Patient arrived to ER on 02/06/20 at 1137  Patient coming from: home Lives   With family    Chief Complaint:   Chief Complaint  Patient presents with  . Abdominal Pain  . Weakness    HPI: Brandon [name] is a 54 y.o. male with medical history significant of EtOH abuse, CVA, Cirrhosis, seizure disorder, chronic pancreatitis, HTN, DM 2, HLD    Presented with few weaks of generalized fatigue, back pain and worsening abdominal pain. States abdominal pain is what brought him to emergency department he was concerned he was having recurrence of pancreatitis. States his back pain has been bothering him for quite some time.  He resides with his sister.  She states that he has been drinking more excessively lately. He walks very slow with a walker, he have had a CVA in the past He continues to drink a bout a pint a day. Last EToh was this AM This a.m. he is already feeling somewhat jittery and feeling like he is withdrawing from alcohol He did state that he has had seizures in the past No N/v/D denies any fevers or chills no chest pain or shortness of breath Patient continues to smoke and at this point not interested in quitting Has not been compliant his medications have not had his Plavix for the past 2 days  Infectious risk factors:  Reports  severe fatigue     Has  NOt been vaccinated against COVID   In  COVID TEST  NEGATIVE   Lab Results  Component Value Date   SARSCOV2NAA NEGATIVE 02/06/2020     Regarding pertinent Chronic problems:     Hyperlipidemia -  on statins   HTN on lisinopril   Cirrhosis 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  Followed in the past with [facility] has not been seen by them recently   DM 2 -  Lab Results  Component Value Date   HGBA1C 5.6 03/11/2016    PO meds only, no longer uses insulin   Hx of CVA -   with/out residual deficits on  Plavix   While in ER: Patient initially complaining of abdominal pain CT scan of abdomen was ordered to evaluate for pancreatitis but showed discitis instead.  Patient on the further investigation did confirm back pain No infectious symptoms.  Denies any drug use. Work-up showed T9-10 and 11 osteodiscitis with marked disc space collapse and significant stenosis at T10 and 11 Patient is neurologically intact  ER Provider Called: Neurosurgery    Dr. Jordan Likes They Recommend admit to medicine order IR consult for aspiration of T10-11 disc space Will see in AM   Hospitalist was called for admission for discitis/empyema  The following Work up has been ordered so far:  Orders Placed This Encounter  Procedures  . Urine culture  . Culture, blood (routine x 2)  . SARS Coronavirus 2 by RT PCR (hospital order, performed in [facility] hospital lab) Nasopharyngeal Nasopharyngeal Swab  . DG Chest Portable 1 View  . CT Abdomen Pelvis W Contrast  . MR THORACIC SPINE W WO CONTRAST  . Lipase, blood  . Comprehensive metabolic panel  . CBC  . Urinalysis, Routine w reflex microscopic  . Sedimentation rate  . C-reactive protein  . Diet NPO time specified  . Saline Lock IV, Maintain IV access  .  Clinical institute withdrawal assessment every 6 hours X 48 hours, then every 12 hours x 48 hours  . Notify MD if CIWA-AR is greater than 10 for 2 consecutive assessments.  . May administer Ativan PO versus IV if patient is tolerating PO intake well  . Vital signs every 6 hours X 48 hours, then per unit protocol  . Bladder scan  . Consult to neurosurgery  ALL PATIENTS BEING ADMITTED/HAVING PROCEDURES NEED COVID-19 SCREENING  . Consult for Unassigned Medical Admission  ALL PATIENTS BEING ADMITTED/HAVING PROCEDURES NEED COVID-19 SCREENING    Following Medications were ordered in ER: Medications  nicotine (NICODERM CQ - dosed in mg/24 hours) patch 14 mg (14 mg Transdermal Patch  Applied 02/06/20 1420)  LORazepam (ATIVAN) injection 0-4 mg (2 mg Intravenous Given 02/06/20 1820)    Or  LORazepam (ATIVAN) tablet 0-4 mg ( Oral See Alternative 02/06/20 1820)  LORazepam (ATIVAN) injection 0-4 mg (has no administration in time range)    Or  LORazepam (ATIVAN) tablet 0-4 mg (has no administration in time range)  thiamine tablet 100 mg (100 mg Oral Given 02/06/20 1819)    Or  thiamine (B-1) injection 100 mg ( Intravenous See Alternative 02/06/20 1819)  sodium chloride flush (NS) 0.9 % injection 3 mL (3 mLs Intravenous Given 02/06/20 1433)  sodium chloride 0.9 % bolus 1,000 mL (0 mLs Intravenous Stopped 02/06/20 1648)  oxyCODONE (Oxy IR/ROXICODONE) immediate release tablet 5 mg (5 mg Oral Given 02/06/20 1353)  iohexol (OMNIPAQUE) 300 MG/ML solution 100 mL (100 mLs Intravenous Contrast Given 02/06/20 1540)  gadobutrol (GADAVIST) 1 MMOL/ML injection 8 mL (8 mLs Intravenous Contrast Given 02/06/20 1916)      Significant initial  Findings: Abnormal Labs Reviewed  LIPASE, BLOOD - Abnormal; Notable for the following components:      Result Value   Lipase 84 (*)    All other components within normal limits  COMPREHENSIVE METABOLIC PANEL - Abnormal; Notable for the following components:   Glucose, Bld 173 (*)    Creatinine, Ser 0.49 (*)    Calcium 8.7 (*)    Albumin 2.6 (*)    AST 13 (*)    All other components within normal limits  CBC - Abnormal; Notable for the following components:   RBC 3.67 (*)    Hemoglobin 11.4 (*)    HCT 35.7 (*)    RDW 18.0 (*)    Platelets 499 (*)    All other components within normal limits  URINALYSIS, ROUTINE W REFLEX MICROSCOPIC - Abnormal; Notable for the following components:   Ketones, ur 5 (*)    All other components within normal limits    Otherwise labs showing:    Recent Labs  Lab 02/06/20 1206  NA 137  K 3.6  CO2 23  GLUCOSE 173*  BUN 10  CREATININE 0.49*  CALCIUM 8.7*    Cr   stable,  Up from baseline see below Lab Results   Component Value Date   CREATININE 0.49 (L) 02/06/2020   CREATININE 0.66 03/29/2016   CREATININE 0.69 03/26/2016    Recent Labs  Lab 02/06/20 1206  AST 13*  ALT 12  ALKPHOS 123  BILITOT 0.3  PROT 8.0  ALBUMIN 2.6*   Lab Results  Component Value Date   CALCIUM 8.7 (L) 02/06/2020   PHOS 4.0 06/12/2012     WBC      Component Value Date/Time   WBC 9.5 02/06/2020 1206   ANC    Component Value Date/Time   NEUTROABS  6.2 03/29/2016 0605      Plt: Lab Results  Component Value Date   PLT 499 (H) 02/06/2020    Lactic Acid, Venous    Component Value Date/Time   LATICACIDVEN 1.5 03/05/2016 1742       COVID-19 Labs  No results for input(s): DDIMER, FERRITIN, LDH, CRP in the last 72 hours.  Lab Results  Component Value Date   SARSCOV2NAA NEGATIVE 02/06/2020      HG/HCT   Stable,     Component Value Date/Time   HGB 11.4 (L) 02/06/2020 1206   HCT 35.7 (L) 02/06/2020 1206    Recent Labs  Lab 02/06/20 1206  LIPASE 84*   No results for input(s): AMMONIA in the last 168 hours.  No components found for: LABALBU     ECG: Ordered      UA   no evidence of UTI    Urine analysis:    Component Value Date/Time   COLORURINE YELLOW 02/06/2020 1355   APPEARANCEUR CLEAR 02/06/2020 1355   LABSPEC 1.015 02/06/2020 1355   PHURINE 5.0 02/06/2020 1355   GLUCOSEU NEGATIVE 02/06/2020 1355   HGBUR NEGATIVE 02/06/2020 1355   BILIRUBINUR NEGATIVE 02/06/2020 1355   KETONESUR 5 (A) 02/06/2020 1355   PROTEINUR NEGATIVE 02/06/2020 1355   UROBILINOGEN 0.2 02/16/2014 1742   NITRITE NEGATIVE 02/06/2020 1355   LEUKOCYTESUR NEGATIVE 02/06/2020 1355     CXR - ? free air not seen on CT  CTabd/pelvis -  discitis at T10-T11 and T9-T chronic pancreatitis  loculated peripherally enhancing fluid collection in the posterior pleural space on the right.  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  Empyema of the right posterior chest  ED Triage Vitals  Enc Vitals Group     BP 02/06/20 1157 (!) 127/107     Pulse Rate 02/06/20 1157 95     Resp 02/06/20 1157 18     Temp 02/06/20 1157 97.8 F (36.6 C)     Temp Source 02/06/20 1157 Oral     SpO2 02/06/20 1157 98 %     Weight 02/06/20 2013 180 lb (81.6 kg)     Height 02/06/20 2013 6' (1.829 m)     Head Circumference --      Peak Flow --      Pain Score 02/06/20 1310 9     Pain Loc --      Pain Edu? --      Excl. in GC? --   TMAX(24)@       Latest  Blood pressure (!) 130/110, pulse (!) 104, temperature 97.8 F (36.6 C), temperature source Oral, resp. rate 17, height 6' (1.829 m), weight 81.6 kg, SpO2 98 %.     Review of Systems:    Pertinent positives include  fatigue, weight loss  Constitutional:  No weight loss, night sweats, Fevers, chills,  HEENT:  No headaches, Difficulty swallowing,Tooth/dental problems,Sore throat,  No sneezing, itching, ear ache, nasal congestion, post nasal drip,  Cardio-vascular:  No chest pain, Orthopnea, PND, anasarca, dizziness, palpitations.no Bilateral lower extremity swelling  GI:  No heartburn, indigestion, abdominal pain, nausea, vomiting, diarrhea, change in bowel habits, loss of appetite, melena, blood in stool, hematemesis Resp:  no shortness of breath at rest. No dyspnea on exertion, No excess mucus, no productive cough, No non-productive cough, No  coughing up of blood.No change in color of mucus.No wheezing. Skin:  no rash or lesions. No jaundice GU:  no dysuria, change in color of urine, no urgency or frequency. No straining to urinate.  No flank pain.  Musculoskeletal:  No joint pain or no joint swelling. No decreased range of motion. No back pain.  Psych:  No change in mood or affect. No depression or anxiety. No memory loss.  Neuro: no  localizing neurological complaints, no tingling, no weakness, no double vision, no gait abnormality, no slurred speech, no confusion  All systems reviewed and apart from HOPI all are negative  Past Medical History:   Past Medical History:  Diagnosis Date  . Alcohol dependency (HCC)    Hx ETOH withdrawal seizures before 2011  . CAD (coronary artery disease) 06/13/2012   Calcification noted on CTA of chest in 2012 Wall motion abnormality on ECHO    . Carotid artery disease (HCC) 2016   bilateral.  s/p left carotid stent 03/2015  . 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   Marland Kitchen Depression with anxiety   . GERD (gastroesophageal reflux disease)   . Headache(784.0)    migraine  . Heart disease 02/2015   PCI/DES placed to RCA: on chronic Plavix/ASA  . Hyperlipidemia   . Hypertension   . Pancreatitis 2011....    CT findings in May 2011 with inflammation and pseudocyst.  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/2016  . Renal artery stenosis, native, bilateral (HCC) 02/2015      Past Surgical History:  Procedure Laterality Date  . CARDIAC CATHETERIZATION N/A 03/15/2015   Procedure: Left Heart Cath;  Surgeon: Laurier Nancy, MD;  Location: [facility] INVASIVE CV LAB;  Service: Cardiovascular;  Laterality: N/A;  . CARDIAC CATHETERIZATION N/A 03/16/2015   Procedure: Coronary Stent Intervention;  Surgeon: Alwyn Pea, MD;  Location: [facility] INVASIVE CV LAB;  Service: Cardiovascular;  Laterality: N/A;  . CAROTID ANGIOGRAM N/A 06/15/2012   Procedure: CAROTID ANGIOGRAM;  Surgeon: Chuck Hint, MD;  Location: [facility] CATH LAB;  Service: Cardiovascular;  Laterality: N/A;  . ESOPHAGOGASTRODUODENOSCOPY N/A 03/08/2016   Procedure: ESOPHAGOGASTRODUODENOSCOPY (EGD);  Surgeon: Ruffin Frederick, MD;  Location: [facility];  Service: Gastroenterology;  Laterality: N/A;  . none    . PERIPHERAL VASCULAR CATHETERIZATION Left 04/06/2015   Procedure: Carotid  PTA/Stent Intervention;  Surgeon: Annice Needy, MD;  Location: [facility] INVASIVE CV LAB;  Service: Cardiovascular;  Laterality: Left;    Social History:  Ambulatory   Dan Humphreys     reports that he has been smoking cigarettes. He has a 30.00 pack-year smoking history. He has never used smokeless tobacco. He reports that he does not drink alcohol or use drugs.     Family History:   Family History  Problem Relation Age of Onset  . Stroke Mother        deceased  . Coronary artery disease Mother   . Alcohol abuse Mother   . Cancer Mother   . Hypertension Father        alive  . Alcohol abuse Father   . Diabetes Father   . Kidney disease Father   . Stroke Maternal Grandmother     Allergies: Allergies  Allergen Reactions  . No Known Allergies      Prior to Admission medications   Medication Sig Start Date End Date Taking? Authorizing Provider  albuterol (PROAIR HFA) 108 (90 Base) MCG/ACT inhaler Inhale 2 puffs into  the lungs every 6 (six) hours as needed for wheezing or shortness of breath.     [provider]  aspirin 81 MG tablet Take 81 mg by mouth every morning.     [provider]  B Complex Vitamins (VITAMIN-B COMPLEX PO) Take 1 tablet by mouth daily.     [provider]  busPIRone (BUSPAR) 5 MG tablet Take 1 tablet (5 mg total) by mouth 2 (two) times daily. 03/29/16   Angiulli, Lavon Paganini, PA-C  clopidogrel (PLAVIX) 75 MG tablet Take by mouth.    [provider]  cyclobenzaprine (FLEXERIL) 10 MG tablet Take 10 mg by mouth every 12 (twelve) hours. 01/08/17   [provider]  divalproex (DEPAKOTE ER) 500 MG 24 hr tablet Take by mouth.    [provider]  famotidine (PEPCID) 20 MG tablet Take 20 mg by mouth 2 (two) times daily as needed. 01/08/17   [provider]  hydrochlorothiazide (MICROZIDE) 12.5 MG capsule Take by mouth daily. 11/21/16   [provider]  insulin glargine (LANTUS) 100 UNIT/ML injection Inject 0.1  mLs (10 Units total) into the skin daily. 03/29/16   Angiulli, Lavon Paganini, PA-C  lisinopril (PRINIVIL,ZESTRIL) 10 MG tablet 1 TAB(S) ORAL EVERY DAY 01/12/17   [provider]  lubiprostone (AMITIZA) 24 MCG capsule Take by mouth.    [provider]  metFORMIN (GLUCOPHAGE) 1000 MG tablet Take 1 tablet (1,000 mg total) by mouth 2 (two) times daily with a meal. 03/29/16   Angiulli, Lavon Paganini, PA-C  Multiple Vitamin (MULTIVITAMIN) tablet Take 1 tablet by mouth daily.    [provider]  nadolol (CORGARD) 40 MG tablet Take 1 tablet (40 mg total) by mouth daily. 03/29/16   Angiulli, Lavon Paganini, PA-C  nicotine (NICODERM CQ - DOSED IN MG/24 HOURS) 21 mg/24hr patch 21 mg patch daily 1 week and 14 mg patch daily 3 weeks then 7 mg patch daily 3 weeks. 03/29/16   Angiulli, Lavon Paganini, PA-C  Omega-3 Fatty Acids (FISH OIL PO) Take 1 capsule by mouth daily.    [provider]  omeprazole (PRILOSEC) 20 MG capsule Take 2 capsules (40 mg total) by mouth daily. 03/12/16   Barton Dubois, MD  Pancrelipase, Lip-Prot-Amyl, (ZENPEP) 25000 units CPEP Take 2 capsules by mouth 3 (three) times daily with meals. 03/29/16   Angiulli, Lavon Paganini, PA-C  simvastatin (ZOCOR) 20 MG tablet Take 20 mg by mouth daily at 6 PM.     [provider]  tiotropium (SPIRIVA HANDIHALER) 18 MCG inhalation capsule Place 18 mcg into inhaler and inhale daily.     [provider]  traMADol (ULTRAM) 50 MG tablet Take 1-2 tablets (50-100 mg total) by mouth every 6 (six) hours as needed. Cannot fill early. Pt given 90 pills. Patient not taking: Reported on 01/23/2017 09/02/16   Garvin Fila, MD  traZODone (DESYREL) 150 MG tablet Take 1 tablet (150 mg total) by mouth at bedtime. 03/29/16   Angiulli, Lavon Paganini, PA-C   Physical Exam: Blood pressure (!) 130/110, pulse (!) 104, temperature 97.8 F (36.6 C), temperature source Oral, resp. rate 17, height 6' (1.829 m), weight 81.6 kg, SpO2 98 %. 1. General:  in No  Acute  distress   Chronically ill  -appearing 2. Psychological: Alert and  Oriented 3. Head/ENT:    Dry Mucous Membranes  Head Non traumatic, neck supple                           Poor Dentition 4. SKIN:  decreased Skin turgor,  Skin clean Dry and intact no rash 5. Heart: Regular rate and rhythm no Murmur, no Rub or gallop 6. Lungs: lear to auscultation bilaterally, no wheezes or crackles diminished on the right  7. Abdomen: Soft, non-tender, Non distended  bowel sounds present 8. Lower extremities: no clubbing, cyanosis, no edema 9. Neurologically  strength 5 out of 5 in all 4 extremities cranial nerves II through XII intact, tremulous 10. MSK: Normal range of motion   All other LABS:     Recent Labs  Lab 02/06/20 1206  WBC 9.5  HGB 11.4*  HCT 35.7*  MCV 97.3  PLT 499*     Recent Labs  Lab 02/06/20 1206  NA 137  K 3.6  CL 100  CO2 23  GLUCOSE 173*  BUN 10  CREATININE 0.49*  CALCIUM 8.7*     Recent Labs  Lab 02/06/20 1206  AST 13*  ALT 12  ALKPHOS 123  BILITOT 0.3  PROT 8.0  ALBUMIN 2.6*       Cultures:    Component Value Date/Time   SDES URINE, CLEAN CATCH 03/09/2016 1549   SPECREQUEST NONE 03/09/2016 1549   CULT (A) 03/09/2016 1549    >=100,000 COLONIES/mL KLEBSIELLA PNEUMONIAE >=100,000 COLONIES/mL ESCHERICHIA COLI    REPTSTATUS 03/11/2016 FINAL 03/09/2016 1549     Radiological Exams on Admission: MR THORACIC SPINE W WO CONTRAST  Result Date: 02/06/2020 CLINICAL DATA:  Back pain and suspected discitis EXAM: MRI THORACIC WITHOUT AND WITH CONTRAST TECHNIQUE: Multiplanar and multiecho pulse sequences of the thoracic spine were obtained without and with intravenous contrast. CONTRAST:  64mL GADAVIST GADOBUTROL 1 MMOL/ML IV SOLN COMPARISON:  None. FINDINGS: MRI THORACIC SPINE FINDINGS Alignment:  Physiologic. Vertebrae: At T9, T10 and T11 there is hyperintense T2-weighted signal, loss of normal T1-weighted signal and diffuse contrast  enhancement of the bone marrow. There is fluid within both disc spaces, worse at the lower level. Cord:  Normal signal and morphology. Paraspinal and other soft tissues: Empyema of the right posterior chest measures 4.7 cm. There is a large amount of paravertebral phlegmon at the T9-T11 levels. There is a ventral epidural abscess extending from T8-T11 that measures up to 5 mm in thickness. Disc levels: T9-10 and T10-11 discitis-osteomyelitis and epidural collection results in moderate T9-10 stenosis and severe T10-11 spinal canal stenosis. There is severe bilateral neural foraminal stenosis at both levels. The other thoracic disc spaces are unremarkable. IMPRESSION: 1. 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 There is severe bilateral neural foraminal stenosis at both levels. 2. Large amount of paravertebral phlegmon at the T9-T11 levels. 3. Empyema of the right posterior chest. Electronically Signed   By: Deatra Robinson M.D.   On: 02/06/2020 19:42   CT Abdomen Pelvis W Contrast  Result Date: 02/06/2020 CLINICAL DATA:  Prior abnormal chest x-ray with possible free air. EXAM: CT ABDOMEN AND PELVIS WITH CONTRAST TECHNIQUE: Multidetector CT imaging of the abdomen and pelvis was performed using the standard protocol following bolus administration of intravenous contrast. CONTRAST:  OMNIPAQUE IOHEXOL 300 MG/ML  SOLN COMPARISON:  None. FINDINGS: Lower chest: Lung bases demonstrate a loculated peripherally enhancing fluid collection in the posterior  pleural space on the right. This corresponds to the density seen on recent plain film examination. Some associated compressive atelectasis is seen as well. Hepatobiliary: Gallbladder is well distended with multiple dependent gallbladder stones. The liver is mildly fatty infiltrated. Intrahepatic biliary ductal dilatation is seen which  may be related to a distal common bile duct stone. Pancreas: Pancreas demonstrates diffuse calcifications as well as mild pancreatic ductal dilatation. Hypodensity is noted in the region of the head and uncinate process best seen on image number 28 of series 3. This is new from the prior exam and may represent a focal pancreatic head mass. It measures approximately 16 mm. Scattered calcifications are again seen similar to that noted on the prior exam consistent with prior chronic pancreatitis. Additionally previously seen left upper quadrant pseudocyst is noted but improved in size when compared with the prior exam. It lies predominately lateral to the spleen but courses along the anterior aspect of the spleen posterior to the stomach. Small pseudocyst is also noted adjacent to the head of the pancreas best seen on image number 30 of series 3. Spleen: Within normal limits. Adrenals/Urinary Tract: Adrenal glands are stable in appearance. Mild fullness of the left adrenal gland is seen. The kidneys demonstrate a normal enhancement pattern bilaterally. No obstructive changes are seen. Right renal cyst is noted. The bladder is well distended. Stomach/Bowel: Colon shows no obstructive or inflammatory changes. The abnormality seen on recent chest x-ray in the right upper quadrant represents interposition of colon between the diaphragm and anterior aspect of the liver. No free air is seen. No small bowel abnormality is noted. The appendix is unremarkable. Stomach is decompressed. Vascular/Lymphatic: Atherosclerotic calcifications of the abdominal aorta are noted. 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 This is likely related to the patient's previous pancreatitis episodes. 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 Reproductive: Prostate is unremarkable. Other: No free air or free fluid is  noted. Musculoskeletal: There are changes at T9-T10 and T10-T11 with erosive changes of the endplates at both levels and perispinal inflammatory changes and enhancement consistent with discitis and paraspinal abscess. The loculated fluid collection on the right in the pleural space may be related to these changes as well. Degenerative changes of lumbar spine are seen. IMPRESSION: Changes highly suspicious for discitis at T10-T11 and T9-T10 with changes of paraspinal abscesses. The fluid collection loculated in the posterior right pleural space may represent sequelae from these changes as well. MRI of the thoracic spine with contrast is recommended for further evaluation. Changes consistent with chronic pancreatitis and persistent but smaller pseudocysts particularly surrounding the spleen and stomach. Hypodensity is noted in the region of the head/uncinate process as described above. This is somewhat suspicious for underlying mass given the new biliary ductal dilatation. MRI of the pancreas is recommended on an outpatient basis to allow for optimum imaging. Cavernous transformation of the portal vein with increase in the size of perigastric varices. No evidence of free air. The abnormality seen on prior chest x-ray is related to interposition of the colon between the liver in the diaphragm. Electronically Signed   By: Alcide Clever M.D.   On: 02/06/2020 16:06   DG Chest Portable 1 View  Result Date: 02/06/2020 CLINICAL DATA:  Cough EXAM: PORTABLE CHEST 1 VIEW COMPARISON:  03/08/2016 FINDINGS: Cardiac shadow is within normal limits. The lungs are well aerated bilaterally. Rounded soft tissue density is noted adjacent  to the right cardiac border. This was not present on a prior CT examination from 2017. This may represent a rounded pneumonia or possible pericardial cyst lungs are otherwise clear. There is apparent free air under the right hemidiaphragm. IMPRESSION: No focal infiltrate is seen. Findings suspicious  for free air under the right hemidiaphragm. Soft tissue density along the right heart border of uncertain significance. CT of the abdomen and pelvis is recommended for further evaluation and characterization of these findings. Electronically Signed   By: Alcide Clever M.D.   On: 02/06/2020 14:16    Chart has been reviewed    Assessment/Plan  54 y.o. male with medical history significant of EtOH abuse, CVA, Cirrhosis, seizure disorder, chronic pancreatitis, HTN, DM 2, HLD     Admitted for T9-T11 discitis empyema no right posterior chest  Present on Admission: . Discitis -appreciate neurosurgery involvement.  Keep n.p.o. postmidnight plan for IR aspiration, patient is on Plavix but has not been taking for the past 2 days. Discussed with ID who will see patient in consult this point since patient is showing no evidence of sepsis would like to hold off on initiation of IV antibiotics until cultures from aspiration obtained Patient currently neurologically intact we will continue to monitor  . Hyperlipidemia -chronic stable continue home medications  . Hypertension -allow permissive hypertension for tonight given slightly low low blood pressure initially will resume when able Restart low-dose metoprolol with holding parameters  . GERD (gastroesophageal reflux disease) -chronic stable continue home medications  . Chronic Pancreatitis.  Persistent likely not improving in the setting of ongoing alcohol abuse.  Currently appears to be chronic issue abnormal imaging showing persistent pancreatic abnormality in the past have been seen at [facility] on MRI and seems to be persistent likely sequela of prior pseudocyst  . Tobacco abuse - Spoke about importance of quitting spent 5 minutes discussing options for treatment, prior attempts at quitting, and dangers of smoking  -At this point patient is   NOT  interested in quitting  - order nicotine patch   - nursing tobacco cessation protocol  . CAD in native  artery chronic stable continue home medications hold aspirin Plavix given patient will likely need invasive procedures  . Alcohol abuse with with likely withdrawal symptoms.  Will admit to stepdown order CIWA protocol thiamine and folate spoke about importance of quitting patient at this point not interested    . Alcohol withdrawal delirium (HCC) order CIWA protocol  . Empyema lung (HCC) -at this point no evidence of sepsis.  No cough or fever no elevated white blood cell count.  This will definitely need to be addressed appreciate IR consult and help with obtaining the sample.  If unable to drain may need CT surgery involvement in the future Initiate antibiotics once able to obtain cultures.  Appreciate ID consult  dm 2-  - Order Sensitive  SSI   -  check TSH and HgA1C  - Hold by mouth medications    Seizure disorder -chronic stable continue home medications  Other plan as per orders.  DVT prophylaxis:  SCD    Code Status:  FULL CODE  as per patient  I had personally discussed CODE STATUS with patient and family   Family Communication:   Family  at  Bedside  plan of care was discussed  with sister  Disposition Plan:    likely will need placement for rehabilitation  Following barriers for discharge:                                                      Pain controlled with PO medications                               able to transition to PO antibiotics                             Will need to be able to tolerate PO                            Will likely need home health,                             Will need consultants to evaluate patient prior to discharge Likely surgical intervention for discitis                       Would benefit from PT/OT eval prior to DC  Ordered                   Swallow eval - SLP ordered                                      Transition of care consulted                   Nutrition    consulted                                        Consults called: Neurosurgery, ID, IR consult  Admission status:  ED Disposition    ED Disposition Condition Comment   Admit  Hospital Area: [facility] [[id number]]  Level of Care: Progressive [102]  Admit to Progressive based on following criteria: ACUTE MENTAL DISORDER-RELATED Drug/Alcohol Ingestion/Overdose/Withdrawal, Suicidal Ideation/attempt requiring safety sitter and < Q2h monitoring/assessments, moderate to severe agitation that is managed with medication/sitter, CIWA-Ar score < 20.  May admit patient to [facility] or [facility] if equivalent level of care is available:: No  Covid Evaluation: Confirmed COVID Negative  Diagnosis: Diskitis [[id number]]  Admitting Physician: Therisa Doyne [[id number]]  Attending Physician: Therisa Doyne [[id number]]  Estimated length of stay: 3 - 4 days  Certification:: I certify this patient will need inpatient services for at least 2 midnights         inpatient     I Expect 2 midnight stay secondary to severity of patient's current illness need for inpatient interventions justified by the following:  hemodynamic instability despite optimal treatment (tachycardia)  Severe lab/radiological/exam abnormalities including:   Discitis/empyema and extensive comorbidities including:  substance abuse     DM2    liver disease *history of stroke with residual deficits .   That are currently affecting medical management. I expect  patient to be hospitalized for 2 midnights requiring inpatient medical care.  Patient is at high  risk for adverse outcome (such as loss of life or disability) if not treated.  Indication for inpatient stay as follows:  Hemodynamic instability despite maximal medical therapy,  ongoing suicidal ideations,  severe pain requiring acute inpatient management,  inability to maintain oral hydration    Need for operative/procedural  intervention    Need for IV antibiotics during admission IV fluids,    IV pain medications,     Level of care      SDU tele indefinitely please discontinue once patient no longer qualifies   Precautions: admitted as  Covid Negative    PPE: Used by the provider:   P100  eye Goggles,  Gloves  gown   Dandra Shambaugh 02/06/2020, 11:46 PM    [facility]     after 2 AM please page floor coverage PA If 7AM-7PM, please contact the day team taking care of the patient using [email or web address]   Patient was evaluated in the context of the global COVID-19 pandemic, which necessitated consideration that the patient might be at risk for infection with the SARS-CoV-2 virus that causes COVID-19. Institutional protocols and algorithms that pertain to the evaluation of patients at risk for COVID-19 are in a state of rapid change based on information released by regulatory bodies including the CDC and federal and state organizations. These policies and algorithms were followed during the patient's care.

## 2020-02-06 NOTE — ED Provider Notes (Signed)
Patient care assumed at 1515.  Pt with hx/o pancreatitis here with increased abdomina pain, fatigue, recent alcohol consumption.  CXR with possible free air, CT pending to further evaluate.  CT with possible discitis.  Pt does report about one week of lower back pain, no falls.  He has associated night sweats for one week.  He also complains of weight loss.  No V/D.  No recent infections.  He does have some bad teeth.  No prior blood stream infection.  He drinks about a pint daily.  Smokes tobacco.  Will check MRI to further evaluate.   MRI c/w discitis with small epidural abscess.  D/w Dr. Jordan Likes with Neurosurgery - he will see the patient in consult, recommends IR consult for bx/drainage.   Hospitalist consulted for admission for further treatment.  Pt updated of findings of studies and recommendation for admission and he is in agreement with treatment plan.    CRITICAL CARE Performed by: Tilden Fossa   Total critical care time: 45 minutes  Critical care time was exclusive of separately billable procedures and treating other patients.  Critical care was necessary to treat or prevent imminent or life-threatening deterioration.  Critical care was time spent personally by me on the following activities: development of treatment plan with patient and/or surrogate as well as nursing, discussions with consultants, evaluation of patient's response to treatment, examination of patient, obtaining history from patient or surrogate, ordering and performing treatments and interventions, ordering and review of laboratory studies, ordering and review of radiographic studies, pulse oximetry and re-evaluation of patient's condition.    Tilden Fossa, MD 02/07/20 [phone]

## 2020-02-06 NOTE — Progress Notes (Signed)
54 yo presents with back pain.  Workup demonstrates T9,10,11 osteo/discitis.  Marked disc space collapse and significant stenosis at T10/11.  No apparent weakness or sensory loss.  Rec Medicine admission with IR aspiration of T10/11 disc space then abx coverage.  Formal consult in the am

## 2020-02-07 ENCOUNTER — Inpatient Hospital Stay (HOSPITAL_COMMUNITY): Payer: Medicaid Other

## 2020-02-07 DIAGNOSIS — I10 Essential (primary) hypertension: Secondary | ICD-10-CM

## 2020-02-07 DIAGNOSIS — F17200 Nicotine dependence, unspecified, uncomplicated: Secondary | ICD-10-CM

## 2020-02-07 LAB — CBC WITH DIFFERENTIAL/PLATELET
Abs Immature Granulocytes: 0.02 10*3/uL (ref 0.00–0.07)
Basophils Absolute: 0 10*3/uL (ref 0.0–0.1)
Basophils Relative: 1 %
Eosinophils Absolute: 0 10*3/uL (ref 0.0–0.5)
Eosinophils Relative: 0 %
HCT: 30.7 % — ABNORMAL LOW (ref 39.0–52.0)
Hemoglobin: 9.9 g/dL — ABNORMAL LOW (ref 13.0–17.0)
Immature Granulocytes: 0 %
Lymphocytes Relative: 16 %
Lymphs Abs: 1.3 10*3/uL (ref 0.7–4.0)
MCH: 30.8 pg (ref 26.0–34.0)
MCHC: 32.2 g/dL (ref 30.0–36.0)
MCV: 95.6 fL (ref 80.0–100.0)
Monocytes Absolute: 0.7 10*3/uL (ref 0.1–1.0)
Monocytes Relative: 9 %
Neutro Abs: 6.2 10*3/uL (ref 1.7–7.7)
Neutrophils Relative %: 74 %
Platelets: 407 10*3/uL — ABNORMAL HIGH (ref 150–400)
RBC: 3.21 MIL/uL — ABNORMAL LOW (ref 4.22–5.81)
RDW: 17.6 % — ABNORMAL HIGH (ref 11.5–15.5)
WBC: 8.2 10*3/uL (ref 4.0–10.5)
nRBC: 0 % (ref 0.0–0.2)

## 2020-02-07 LAB — PROTIME-INR
INR: 1.1 (ref 0.8–1.2)
Prothrombin Time: 13.4 seconds (ref 11.4–15.2)

## 2020-02-07 LAB — COMPREHENSIVE METABOLIC PANEL
ALT: 11 U/L (ref 0–44)
AST: 13 U/L — ABNORMAL LOW (ref 15–41)
Albumin: 2.4 g/dL — ABNORMAL LOW (ref 3.5–5.0)
Alkaline Phosphatase: 113 U/L (ref 38–126)
Anion gap: 9 (ref 5–15)
BUN: 12 mg/dL (ref 6–20)
CO2: 25 mmol/L (ref 22–32)
Calcium: 8.7 mg/dL — ABNORMAL LOW (ref 8.9–10.3)
Chloride: 100 mmol/L (ref 98–111)
Creatinine, Ser: 0.54 mg/dL — ABNORMAL LOW (ref 0.61–1.24)
GFR calc Af Amer: >60 mL/min (ref >60)
GFR calc non Af Amer: >60 mL/min (ref >60)
Glucose, Bld: 234 mg/dL — ABNORMAL HIGH (ref 70–99)
Potassium: 3.8 mmol/L (ref 3.5–5.1)
Sodium: 134 mmol/L — ABNORMAL LOW (ref 135–145)
Total Bilirubin: 0.6 mg/dL (ref 0.3–1.2)
Total Protein: 7.2 g/dL (ref 6.5–8.1)

## 2020-02-07 LAB — RAPID URINE DRUG SCREEN, HOSP PERFORMED
Amphetamines: NOT DETECTED
Barbiturates: NOT DETECTED
Benzodiazepines: POSITIVE — AB
Cocaine: NOT DETECTED
Opiates: POSITIVE — AB
Tetrahydrocannabinol: NOT DETECTED

## 2020-02-07 LAB — ECHOCARDIOGRAM COMPLETE
Height: 72 in
Weight: 2880 oz

## 2020-02-07 LAB — PREALBUMIN: Prealbumin: 10.5 mg/dL — ABNORMAL LOW (ref 18–38)

## 2020-02-07 LAB — HIV ANTIBODY (ROUTINE TESTING W REFLEX): HIV Screen 4th Generation wRfx: NONREACTIVE

## 2020-02-07 LAB — URINE CULTURE: Culture: NO GROWTH

## 2020-02-07 LAB — MRSA PCR SCREENING: MRSA by PCR: NEGATIVE

## 2020-02-07 LAB — TSH: TSH: 1.398 u[IU]/mL (ref 0.350–4.500)

## 2020-02-07 LAB — AMMONIA: Ammonia: 22 umol/L (ref 9–35)

## 2020-02-07 LAB — GLUCOSE, CAPILLARY
Glucose-Capillary: 107 mg/dL — ABNORMAL HIGH (ref 70–99)
Glucose-Capillary: 145 mg/dL — ABNORMAL HIGH (ref 70–99)
Glucose-Capillary: 146 mg/dL — ABNORMAL HIGH (ref 70–99)
Glucose-Capillary: 220 mg/dL — ABNORMAL HIGH (ref 70–99)
Glucose-Capillary: 323 mg/dL — ABNORMAL HIGH (ref 70–99)
Glucose-Capillary: 95 mg/dL (ref 70–99)

## 2020-02-07 LAB — MAGNESIUM: Magnesium: 1.3 mg/dL — ABNORMAL LOW (ref 1.7–2.4)

## 2020-02-07 LAB — PHOSPHORUS: Phosphorus: 2.9 mg/dL (ref 2.5–4.6)

## 2020-02-07 MED ORDER — GLUCERNA SHAKE PO LIQD
237.0000 mL | Freq: Three times a day (TID) | ORAL | Status: DC
  Administered 2020-02-07 – 2020-02-19 (×31): 237 mL via ORAL
  Filled 2020-02-07 (×5): qty 237

## 2020-02-07 MED ORDER — PRO-STAT SUGAR FREE PO LIQD
30.0000 mL | Freq: Two times a day (BID) | ORAL | Status: DC
  Administered 2020-02-07 – 2020-02-19 (×22): 30 mL via ORAL
  Filled 2020-02-07 (×22): qty 30

## 2020-02-07 NOTE — Evaluation (Signed)
Physical Therapy Evaluation Patient Details Name: Brandon [name] MRN: [id number] DOB: 1965-11-05 Today's Date: 02/07/2020   History of Present Illness  Brandon [name] is a 54 year old Caucasian male with past medical history notable for EtOH abuse, CVA, EtOH cirrhosis, seizure disorder, chronic pancreatitis, HTN, type 2 diabetes mellitus, HLD with generalized fatigue, and progressive back pain.  Thought he had exacerbation of his chornic pancreatitis, MR T-spine notable for discitis/osteomyelitis at T9/10 and T10/11 with ventral epidural abscess measuring up to 5 mm in thickness and causing moderate T9/10 and severe T10/11 spinal canal stenosis.  Clinical Impression  Patient presents with decreased mobility due to L LE weakness, pain, decreased endurance/activity tolerance with history of back pain, now 8/10.  Feel he will benefit from skilled PT in the acute setting to allow d/c home with intermittent family support.      Follow Up Recommendations Supervision - Intermittent;No PT follow up    Equipment Recommendations  None recommended by PT    Recommendations for Other Services       Precautions / Restrictions Precautions Precautions: Fall      Mobility  Bed Mobility Overal bed mobility: Needs Assistance Bed Mobility: Supine to Sit     Supine to sit: Supervision        Transfers Overall transfer level: Needs assistance   Transfers: Sit to/from Stand Sit to Stand: Min guard;Supervision         General transfer comment: pushing up with hands, slow but steady and using good safety techniques  Ambulation/Gait Ambulation/Gait assistance: Supervision Gait Distance (Feet): 130 Feet Assistive device: Rolling walker (2 wheeled) Gait Pattern/deviations: Step-through pattern;Decreased dorsiflexion - left;Decreased stride length;Trunk flexed     General Gait Details: stays flexed throughout despite cues for posture, noted L foot dragging at times, denies any  falls  Stairs            Wheelchair Mobility    Modified Rankin (Stroke Patients Only)       Balance Overall balance assessment: Needs assistance   Sitting balance-Leahy Scale: Good       Standing balance-Leahy Scale: Fair Standing balance comment: can stand statically without RW, stood to toilet in bathroom                             Pertinent Vitals/Pain Pain Assessment: 0-10 Pain Score: 9  Pain Location: mid back and stomach Pain Descriptors / Indicators: Guarding;Aching Pain Intervention(s): Monitored during session;Repositioned    Home Living Family/patient expects to be discharged to:: Private residence Living Arrangements: Other relatives(sister) Available Help at Discharge: Family;Available PRN/intermittently Type of Home: House Home Access: Stairs to enter Entrance Stairs-Rails: Right;Left;Can reach both   Home Layout: Two level;Able to live on main level with bedroom/bathroom Home Equipment: Bedside commode;Walker - 2 wheels      Prior Function Level of Independence: Independent with assistive device(s)               Hand Dominance        Extremity/Trunk Assessment   Upper Extremity Assessment Upper Extremity Assessment: Generalized weakness    Lower Extremity Assessment Lower Extremity Assessment: Generalized weakness;LLE deficits/detail LLE Deficits / Details: weakness noted with ambulation and L toe drag, reports going on for months       Communication   Communication: No difficulties  Cognition Arousal/Alertness: Awake/alert Behavior During Therapy: Flat affect Overall Cognitive Status: Impaired/Different from baseline Area of Impairment: Orientation  Orientation Level: Time("Thursday")                    General Comments      Exercises     Assessment/Plan    PT Assessment Patient needs continued PT services  PT Problem List Decreased strength;Decreased mobility;Decreased  safety awareness;Decreased balance;Decreased knowledge of use of DME;Decreased activity tolerance       PT Treatment Interventions DME instruction;Therapeutic activities;Therapeutic exercise;Gait training;Patient/family education;Stair training;Functional mobility training;Balance training    PT Goals (Current goals can be found in the Care Plan section)  Acute Rehab PT Goals Patient Stated Goal: agreeable to PT PT Goal Formulation: With patient Time For Goal Achievement: 02/21/20 Potential to Achieve Goals: Good    Frequency Min 3X/week   Barriers to discharge        Co-evaluation               AM-PAC PT "6 Clicks" Mobility  Outcome Measure Help needed turning from your back to your side while in a flat bed without using bedrails?: None Help needed moving from lying on your back to sitting on the side of a flat bed without using bedrails?: None Help needed moving to and from a bed to a chair (including a wheelchair)?: None Help needed standing up from a chair using your arms (e.g., wheelchair or bedside chair)?: None Help needed to walk in hospital room?: None Help needed climbing 3-5 steps with a railing? : A Little 6 Click Score: 23    End of Session   Activity Tolerance: Patient tolerated treatment well Patient left: with call bell/phone within reach;in chair   PT Visit Diagnosis: Other abnormalities of gait and mobility (R26.89);Muscle weakness (generalized) (M62.81)    Time: 7628-3151 PT Time Calculation (min) (ACUTE ONLY): 26 min   Charges:   PT Evaluation $PT Eval Moderate Complexity: 1 Mod PT Treatments $Gait Training: 8-22 mins        Sheran Lawless, [facility] [phone] 02/07/2020   Brandon [name] 02/07/2020, 1:44 PM

## 2020-02-07 NOTE — Consult Note (Signed)
Reason for Consult: Osteomyelitis discitis Referring Physician: Emergency department  Brandon [name] is an 54 y.o. male.  HPI: 54 year old male with history of alcoholic pancreatitis presents with worsening back pain.  Pain is progressively worsened over the past few weeks.  Not associated with any accident or injury.  Work-up does demonstrate evidence of significant osteomyelitis discitis at T9-T10 and T11 with moderately severe stenosis at T1011.  Patient denies any weakness or sensory loss in his lower extremities.  He does note some difficulty initiating urination but is had no significant difficulty with frank incontinence.  He denies sacral numbness or tingling.  He reports that he still has some sexual function.  Past Medical History:  Diagnosis Date  . Alcohol dependency (HCC)    Hx ETOH withdrawal seizures before 2011  . CAD (coronary artery disease) 06/13/2012   Calcification noted on CTA of chest in 2012 Wall motion abnormality on ECHO    . Carotid artery disease (HCC) 2016   bilateral.  s/p left carotid stent 03/2015  . 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   Marland Kitchen Depression with anxiety   . GERD (gastroesophageal reflux disease)   . Headache(784.0)    migraine  . Heart disease 02/2015   PCI/DES placed to RCA: on chronic Plavix/ASA  . Hyperlipidemia   . Hypertension   . Pancreatitis 2011....    CT findings in May 2011 with inflammation and pseudocyst.  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/2016  . Renal artery stenosis, native, bilateral (HCC) 02/2015    Past Surgical History:  Procedure Laterality Date  . CARDIAC CATHETERIZATION N/A 03/15/2015   Procedure: Left Heart Cath;  Surgeon: Laurier Nancy, MD;  Location: [facility] INVASIVE CV LAB;  Service: Cardiovascular;  Laterality: N/A;  . CARDIAC CATHETERIZATION N/A 03/16/2015   Procedure: Coronary Stent Intervention;  Surgeon: Alwyn Pea, MD;  Location: [facility] INVASIVE CV  LAB;  Service: Cardiovascular;  Laterality: N/A;  . CAROTID ANGIOGRAM N/A 06/15/2012   Procedure: CAROTID ANGIOGRAM;  Surgeon: Chuck Hint, MD;  Location: [facility] CATH LAB;  Service: Cardiovascular;  Laterality: N/A;  . ESOPHAGOGASTRODUODENOSCOPY N/A 03/08/2016   Procedure: ESOPHAGOGASTRODUODENOSCOPY (EGD);  Surgeon: Ruffin Frederick, MD;  Location: [facility] ENDOSCOPY;  Service: Gastroenterology;  Laterality: N/A;  . none    . PERIPHERAL VASCULAR CATHETERIZATION Left 04/06/2015   Procedure: Carotid PTA/Stent Intervention;  Surgeon: Annice Needy, MD;  Location: [facility] INVASIVE CV LAB;  Service: Cardiovascular;  Laterality: Left;    Family History  Problem Relation Age of Onset  . Stroke Mother        deceased  . Coronary artery disease Mother   . Alcohol abuse Mother   . Cancer Mother   . Hypertension Father        alive  . Alcohol abuse Father   . Diabetes Father   . Kidney disease Father   . Stroke Maternal Grandmother     Social History:  reports that he has been smoking cigarettes. He has a 30.00 pack-year smoking history. He has never used smokeless tobacco. He reports that he does not drink alcohol or use drugs.  Allergies:  Allergies  Allergen Reactions  . No Known Allergies     Medications: I have reviewed the patient's current medications.  Results for orders placed or performed during the hospital encounter of 02/06/20 (from the past 48 hour(s))  Lipase, blood     Status: Abnormal   Collection Time: 02/06/20 12:06  PM  Result Value Ref Range   Lipase 84 (H) 11 - 51 U/L    Comment: Performed at [facility] Lab, [address] [place], [place] [address]  Comprehensive metabolic panel     Status: Abnormal   Collection Time: 02/06/20 12:06 PM  Result Value Ref Range   Sodium 137 135 - 145 mmol/L   Potassium 3.6 3.5 - 5.1 mmol/L   Chloride 100 98 - 111 mmol/L   CO2 23 22 - 32 mmol/L   Glucose, Bld 173 (H) 70 - 99 mg/dL    Comment: Glucose reference range applies  only to samples taken after fasting for at least 8 hours.   BUN 10 6 - 20 mg/dL   Creatinine, Ser 1.73 (L) 0.61 - 1.24 mg/dL   Calcium 8.7 (L) 8.9 - 10.3 mg/dL   Total Protein 8.0 6.5 - 8.1 g/dL   Albumin 2.6 (L) 3.5 - 5.0 g/dL   AST 13 (L) 15 - 41 U/L   ALT 12 0 - 44 U/L   Alkaline Phosphatase 123 38 - 126 U/L   Total Bilirubin 0.3 0.3 - 1.2 mg/dL   GFR calc non Af Amer >60 >60 mL/min   GFR calc Af Amer >60 >60 mL/min   Anion gap 14 5 - 15    Comment: Performed at [facility] Lab, [address], [place], [place] [address]  CBC     Status: Abnormal   Collection Time: 02/06/20 12:06 PM  Result Value Ref Range   WBC 9.5 4.0 - 10.5 K/uL   RBC 3.67 (L) 4.22 - 5.81 MIL/uL   Hemoglobin 11.4 (L) 13.0 - 17.0 g/dL   HCT 41.0 (L) 30.1 - 31.4 %   MCV 97.3 80.0 - 100.0 fL   MCH 31.1 26.0 - 34.0 pg   MCHC 31.9 30.0 - 36.0 g/dL   RDW 38.8 (H) 87.5 - 79.7 %   Platelets 499 (H) 150 - 400 K/uL   nRBC 0.0 0.0 - 0.2 %    Comment: Performed at [facility], [address], [place], [place] [address]  Urinalysis, Routine w reflex microscopic     Status: Abnormal   Collection Time: 02/06/20  1:55 PM  Result Value Ref Range   Color, Urine YELLOW YELLOW   APPearance CLEAR CLEAR   Specific Gravity, Urine 1.015 1.005 - 1.030   pH 5.0 5.0 - 8.0   Glucose, UA NEGATIVE NEGATIVE mg/dL   Hgb urine dipstick NEGATIVE NEGATIVE   Bilirubin Urine NEGATIVE NEGATIVE   Ketones, ur 5 (A) NEGATIVE mg/dL   Protein, ur NEGATIVE NEGATIVE mg/dL   Nitrite NEGATIVE NEGATIVE   Leukocytes,Ua NEGATIVE NEGATIVE    Comment: Performed at [facility] Lab, [address], [place], [place] [address]  SARS Coronavirus 2 by RT PCR (hospital order, performed in [facility] hospital lab) Nasopharyngeal Nasopharyngeal Swab     Status: None   Collection Time: 02/06/20  6:13 PM   Specimen: Nasopharyngeal Swab  Result Value Ref Range   SARS Coronavirus 2 NEGATIVE NEGATIVE    Comment: (NOTE) SARS-CoV-2 target nucleic  acids are NOT DETECTED. The SARS-CoV-2 RNA is generally detectable in upper and lower respiratory specimens during the acute phase of infection. The lowest concentration of SARS-CoV-2 viral copies this assay can detect is 250 copies / mL. A negative result does not preclude SARS-CoV-2 infection and should not be used as the sole basis for treatment or other patient management decisions.  A negative result  may occur with improper specimen collection / handling, submission of specimen other than nasopharyngeal swab, presence of viral mutation(s) within the areas targeted by this assay, and inadequate number of viral copies (<250 copies / mL). A negative result must be combined with clinical observations, patient history, and epidemiological information. Fact Sheet for Patients:   BoilerBrush.com.cy Fact Sheet for Healthcare Providers: https://pope.com/ This test is not yet approved or cleared  by the [place] FDA and has been authorized for detection and/or diagnosis of SARS-CoV-2 by FDA under an Emergency Use Authorization (EUA).  This EUA will remain in effect (meaning this test can be used) for the duration of the COVID-19 declaration under Section 564(b)(1) of the Act, 21 U.S.C. section 360bbb-3(b)(1), unless the authorization is terminated or revoked sooner. Performed at [facility] Lab, [address], [place], [place] [address]   Sedimentation rate     Status: Abnormal   Collection Time: 02/06/20  8:39 PM  Result Value Ref Range   Sed Rate 105 (H) 0 - 16 mm/hr    Comment: Performed at [facility] Lab, [address], [place], [place] [address]  C-reactive protein     Status: Abnormal   Collection Time: 02/06/20  8:39 PM  Result Value Ref Range   CRP 5.0 (H) <1.0 mg/dL    Comment: Performed at [facility] Lab, [address], [place], [place] [address]  MRSA PCR Screening     Status: None   Collection Time: 02/06/20  11:22 PM   Specimen: Nasal Mucosa; Nasopharyngeal  Result Value Ref Range   MRSA by PCR NEGATIVE NEGATIVE    Comment:        The GeneXpert MRSA Assay (FDA approved for NASAL specimens only), is one component of a comprehensive MRSA colonization surveillance program. It is not intended to diagnose MRSA infection nor to guide or monitor treatment for MRSA infections. Performed at [facility], [address], [place], [place] [address]   HIV Antibody (routine testing w rflx)     Status: None   Collection Time: 02/07/20 12:29 AM  Result Value Ref Range   HIV Screen 4th Generation wRfx Non Reactive Non Reactive    Comment: Performed at [facility] Lab, [address], [place], [place] [address]  Magnesium     Status: Abnormal   Collection Time: 02/07/20 12:29 AM  Result Value Ref Range   Magnesium 1.3 (L) 1.7 - 2.4 mg/dL    Comment: Performed at [facility], [address], [place], [place] [address]  Phosphorus     Status: None   Collection Time: 02/07/20 12:29 AM  Result Value Ref Range   Phosphorus 2.9 2.5 - 4.6 mg/dL    Comment: Performed at [facility] Lab, [address] [place], [place] [address]  CBC WITH DIFFERENTIAL     Status: Abnormal   Collection Time: 02/07/20 12:29 AM  Result Value Ref Range   WBC 8.2 4.0 - 10.5 K/uL   RBC 3.21 (L) 4.22 - 5.81 MIL/uL   Hemoglobin 9.9 (L) 13.0 - 17.0 g/dL   HCT 96.2 (L) 95.2 - 84.1 %   MCV 95.6 80.0 - 100.0 fL   MCH 30.8 26.0 - 34.0 pg   MCHC 32.2 30.0 - 36.0 g/dL   RDW 32.4 (H) 40.1 - 02.7 %   Platelets 407 (H) 150 - 400 K/uL   nRBC 0.0 0.0 - 0.2 %   Neutrophils Relative % 74 %  Neutro Abs 6.2 1.7 - 7.7 K/uL   Lymphocytes Relative 16 %   Lymphs Abs 1.3 0.7 - 4.0 K/uL   Monocytes Relative 9 %   Monocytes Absolute 0.7 0.1 - 1.0 K/uL   Eosinophils Relative 0 %   Eosinophils Absolute 0.0 0.0 - 0.5 K/uL   Basophils Relative 1 %   Basophils Absolute 0.0 0.0 - 0.1 K/uL   Immature Granulocytes 0 %   Abs  Immature Granulocytes 0.02 0.00 - 0.07 K/uL    Comment: Performed at [facility] Lab, [address], [place], [place] [address]  TSH     Status: None   Collection Time: 02/07/20 12:29 AM  Result Value Ref Range   TSH 1.398 0.350 - 4.500 uIU/mL    Comment: Performed by a 3rd Generation assay with a functional sensitivity of <=0.01 uIU/mL. Performed at [facility], [address] [place], [place] [address]   Comprehensive metabolic panel     Status: Abnormal   Collection Time: 02/07/20 12:29 AM  Result Value Ref Range   Sodium 134 (L) 135 - 145 mmol/L   Potassium 3.8 3.5 - 5.1 mmol/L   Chloride 100 98 - 111 mmol/L   CO2 25 22 - 32 mmol/L   Glucose, Bld 234 (H) 70 - 99 mg/dL    Comment: Glucose reference range applies only to samples taken after fasting for at least 8 hours.   BUN 12 6 - 20 mg/dL   Creatinine, Ser 7.51 (L) 0.61 - 1.24 mg/dL   Calcium 8.7 (L) 8.9 - 10.3 mg/dL   Total Protein 7.2 6.5 - 8.1 g/dL   Albumin 2.4 (L) 3.5 - 5.0 g/dL   AST 13 (L) 15 - 41 U/L   ALT 11 0 - 44 U/L   Alkaline Phosphatase 113 38 - 126 U/L   Total Bilirubin 0.6 0.3 - 1.2 mg/dL   GFR calc non Af Amer >60 >60 mL/min   GFR calc Af Amer >60 >60 mL/min   Anion gap 9 5 - 15    Comment: Performed at [facility] Lab, [address] [place], [place] [address]  Ammonia     Status: None   Collection Time: 02/07/20 12:29 AM  Result Value Ref Range   Ammonia 22 9 - 35 umol/L    Comment: Performed at [facility] Lab, [address], [place], [place] [address]  Protime-INR     Status: None   Collection Time: 02/07/20 12:29 AM  Result Value Ref Range   Prothrombin Time 13.4 11.4 - 15.2 seconds   INR 1.1 0.8 - 1.2    Comment: (NOTE) INR goal varies based on device and disease states. Performed at [facility], [address], [place], [place] [address]   Prealbumin     Status: Abnormal   Collection Time: 02/07/20 12:29 AM  Result Value Ref Range   Prealbumin 10.5 (L) 18 - 38  mg/dL    Comment: Performed at [facility] Lab, [address] [place], [place] [address]  Glucose, capillary     Status: Abnormal   Collection Time: 02/07/20 12:36 AM  Result Value Ref Range   Glucose-Capillary 220 (H) 70 - 99 mg/dL    Comment: Glucose reference range applies only to samples taken after fasting for at least 8 hours.  Glucose, capillary     Status: None   Collection Time: 02/07/20  4:23 AM  Result Value Ref Range   Glucose-Capillary 95 70 -  99 mg/dL    Comment: Glucose reference range applies only to samples taken after fasting for at least 8 hours.  Glucose, capillary     Status: Abnormal   Collection Time: 02/07/20  7:46 AM  Result Value Ref Range   Glucose-Capillary 146 (H) 70 - 99 mg/dL    Comment: Glucose reference range applies only to samples taken after fasting for at least 8 hours.    MR THORACIC SPINE W WO CONTRAST  Result Date: 02/06/2020 CLINICAL DATA:  Back pain and suspected discitis EXAM: MRI THORACIC WITHOUT AND WITH CONTRAST TECHNIQUE: Multiplanar and multiecho pulse sequences of the thoracic spine were obtained without and with intravenous contrast. CONTRAST:  56mL GADAVIST GADOBUTROL 1 MMOL/ML IV SOLN COMPARISON:  None. FINDINGS: MRI THORACIC SPINE FINDINGS Alignment:  Physiologic. Vertebrae: At T9, T10 and T11 there is hyperintense T2-weighted signal, loss of normal T1-weighted signal and diffuse contrast enhancement of the bone marrow. There is fluid within both disc spaces, worse at the lower level. Cord:  Normal signal and morphology. Paraspinal and other soft tissues: Empyema of the right posterior chest measures 4.7 cm. There is a large amount of paravertebral phlegmon at the T9-T11 levels. There is a ventral epidural abscess extending from T8-T11 that measures up to 5 mm in thickness. Disc levels: T9-10 and T10-11 discitis-osteomyelitis and epidural collection results in moderate T9-10 stenosis and severe T10-11 spinal canal stenosis. There is severe  bilateral neural foraminal stenosis at both levels. The other thoracic disc spaces are unremarkable. IMPRESSION: 1. 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 There is severe bilateral neural foraminal stenosis at both levels. 2. Large amount of paravertebral phlegmon at the T9-T11 levels. 3. Empyema of the right posterior chest. Electronically Signed   By: Ulyses Jarred M.D.   On: 02/06/2020 19:42   CT Abdomen Pelvis W Contrast  Result Date: 02/06/2020 CLINICAL DATA:  Prior abnormal chest x-ray with possible free air. EXAM: CT ABDOMEN AND PELVIS WITH CONTRAST TECHNIQUE: Multidetector CT imaging of the abdomen and pelvis was performed using the standard protocol following bolus administration of intravenous contrast. CONTRAST:  153mL OMNIPAQUE IOHEXOL 300 MG/ML  SOLN COMPARISON:  None. FINDINGS: Lower chest: Lung bases demonstrate a loculated peripherally enhancing fluid collection in the posterior pleural space on the right. This corresponds to the density seen on recent plain film examination. Some associated compressive atelectasis is seen as well. Hepatobiliary: Gallbladder is well distended with multiple dependent gallbladder stones. The liver is mildly fatty infiltrated. Intrahepatic biliary ductal dilatation is seen which may be related to a distal common bile duct stone. Pancreas: Pancreas demonstrates diffuse calcifications as well as mild pancreatic ductal dilatation. Hypodensity is noted in the region of the head and uncinate process best seen on image number 28 of series 3. This is new from the prior exam and may represent a focal pancreatic head mass. It measures approximately 16 mm. Scattered calcifications are again seen similar to that noted on the prior exam consistent with prior chronic pancreatitis. Additionally previously seen left upper quadrant pseudocyst is  noted but improved in size when compared with the prior exam. It lies predominately lateral to the spleen but courses along the anterior aspect of the spleen posterior to the stomach. Small pseudocyst is also noted adjacent to the head of the pancreas best seen on image number 30 of series 3. Spleen: Within normal limits. Adrenals/Urinary Tract:  Adrenal glands are stable in appearance. Mild fullness of the left adrenal gland is seen. The kidneys demonstrate a normal enhancement pattern bilaterally. No obstructive changes are seen. Right renal cyst is noted. The bladder is well distended. Stomach/Bowel: Colon shows no obstructive or inflammatory changes. The abnormality seen on recent chest x-ray in the right upper quadrant represents interposition of colon between the diaphragm and anterior aspect of the liver. No free air is seen. No small bowel abnormality is noted. The appendix is unremarkable. Stomach is decompressed. Vascular/Lymphatic: Atherosclerotic calcifications of the abdominal aorta are noted. 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 This is likely related to the patient's previous pancreatitis episodes. 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 Reproductive: Prostate is unremarkable. Other: No free air or free fluid is noted. Musculoskeletal: There are changes at T9-T10 and T10-T11 with erosive changes of the endplates at both levels and perispinal inflammatory changes and enhancement consistent with discitis and paraspinal abscess. The loculated fluid collection on the right in the pleural space may be related to these changes as well. Degenerative changes of lumbar spine are seen. IMPRESSION: Changes highly suspicious for discitis at T10-T11 and T9-T10 with changes of paraspinal abscesses. The fluid collection loculated in the posterior right pleural space may represent  sequelae from these changes as well. MRI of the thoracic spine with contrast is recommended for further evaluation. Changes consistent with chronic pancreatitis and persistent but smaller pseudocysts particularly surrounding the spleen and stomach. Hypodensity is noted in the region of the head/uncinate process as described above. This is somewhat suspicious for underlying mass given the new biliary ductal dilatation. MRI of the pancreas is recommended on an outpatient basis to allow for optimum imaging. Cavernous transformation of the portal vein with increase in the size of perigastric varices. No evidence of free air. The abnormality seen on prior chest x-ray is related to interposition of the colon between the liver in the diaphragm. Electronically Signed   By: Alcide CleverMark  Lukens M.D.   On: 02/06/2020 16:06   DG Chest Portable 1 View  Result Date: 02/06/2020 CLINICAL DATA:  Cough EXAM: PORTABLE CHEST 1 VIEW COMPARISON:  03/08/2016 FINDINGS: Cardiac shadow is within normal limits. The lungs are well aerated bilaterally. Rounded soft tissue density is noted adjacent to the right cardiac border. This was not present on a prior CT examination from 2017. This may represent a rounded pneumonia or possible pericardial cyst lungs are otherwise clear. There is apparent free air under the right hemidiaphragm. IMPRESSION: No focal infiltrate is seen. Findings suspicious for free air under the right hemidiaphragm. Soft tissue density along the right heart border of uncertain significance. CT of the abdomen and pelvis is recommended for further evaluation and characterization of these findings. Electronically Signed   By: Alcide CleverMark  Lukens M.D.   On: 02/06/2020 14:16    Pertinent items noted in HPI and remainder of comprehensive ROS otherwise negative. Blood pressure 96/71, pulse 94, temperature 98.6 F (37 C), temperature source Oral, resp. rate 20, height 6' (1.829 m), weight 81.6 kg, SpO2 100 %. Patient is awake and alert.   He is oriented and reasonably appropriate.  His speech is fluent.  His judgment insight are intact.  His cranial nerve function is normal bilaterally.  Motor examination extremities reveals 5/5 strength in both upper and lower extremities.  Straight raising is negative bilaterally.  Deep services are normal active.  No  evidence for long track signs.  Sacral sensation normal.  Rectal tone normal.  Examination head ears eyes nose throat is unremarked.  Chest and abdomen are benign.  Extremities are free from injury deformity.  Thoracic spine is moderately tender without evidence of significant bony abnormality.   Assessment/Plan: T9-T10-T11 osteomyelitis discitis, likely staph aureus.  Patient needs disc base aspiration per interventional radiology.  Although the patient has some degree of stenosis I do not think there is a frank liquefied abscess that is amenable to drainage.  Should he began having symptoms of significant myelopathy we could consider dorsal decompression at T10-11 however at this time I do not think it is indicated.  Sherilyn Cooter A Maclovia Uher 02/07/2020, 8:51 AM

## 2020-02-07 NOTE — Progress Notes (Signed)
PROGRESS NOTE    Brandon [name]  XNA:[id number] DOB: 08/17/1966 DOA: 02/06/2020 PCP: Armando Gang, FNP    Brief Narrative:  Brandon [name] is a 54 year old Caucasian male with past medical history notable for EtOH abuse, CVA, EtOH cirrhosis, seizure disorder, chronic pancreatitis, HTN, type 2 diabetes mellitus, HLD with generalized fatigue, and progressive back pain.  Patient states back pain onset roughly 3 months ago that has been progressing.  He also was complaining of some abdominal pain and thought it was an exacerbation of his chronic pancreatitis.  No other complaints or concerns at this time.  Patient continues to drink about a pint of liquor a day and continues with tobacco abuse.  Also reports noncompliance with his home medications, has been off his Plavix for the last 2 days.  In the ED, patient afebrile without leukocytosis.  CT abdomen/pelvis notable for highly suspicious discitis with T9/10 and T10/11 with fluid collection noted posterior right pleural space.  MR T-spine notable for discitis/osteomyelitis at T9/10 and T10/11 with ventral epidural abscess measuring up to 5 mm in thickness and causing moderate T9/10 and severe T10/11 spinal canal stenosis.  Large amount of paravertebral phlegmon at T9-T11 levels and empyema right posterior chest.  Neurosurgery was consulted for evaluation, TRH consulted for further evaluation and management with admission.   Assessment & Plan:   Active Problems:   Hypertension   GERD (gastroesophageal reflux disease)   Chronic Pancreatitis.   Hyperlipidemia   Smoking   CAD in native artery   Alcohol abuse   Tobacco abuse   Diabetes mellitus type 2 in nonobese (HCC)   History of CVA with residual deficit   Essential hypertension, benign   Diskitis   Alcohol withdrawal delirium (HCC)   Epilepsia (HCC)   Empyema lung (HCC)   Thoracic spine discitis, osteomyelitis with abscess Patient presenting with progressive low back pain over  the last several months.  Denies IV drug use.  CT/MR findings concerning for osteomyelitis with abscess formation T9-T11 segments with significant paraspinal phlegmon causing spinal canal stenosis.  Patient is afebrile, white blood cell count 8.2, within normal limits. --Neurosurgery, infectious disease following, appreciate assistance --Blood cultures x2: Pending --UDS: Pending --TTE: Pending --Pending IR guided aspiration of fluid collection --Given patient is hemodynamically stable and no signs of sepsis, holding antibiotics until fluid sample obtained; plan to start vancomycin and ceftriaxone following procedure --If develops significant myelopathy neurosurgery would consider dorsal decompression T10/11; currently no indication at this time  Right lung empyema Imaging notable for empyema right posterior chest measuring 4.7 cm. --IR consulted for thoracentesis/drainage, if unable may need to consider pulmonary evaluation for chest tube placement versus CTS for consideration of decortication  EtOH dependence/abuse Patient continues to endorse liquor use 1 pint daily.  Patient reports withdrawal symptoms in the past.  Counseled on need for cessation. --CIWAA protocol with symptom triggered Ativan --Thiamine, folic acid, multivitamin  EtOH cirrhosis Apparently been followed by [facility] in the past.  Continues with EtOH abuse as above.  Chronic pancreatitis Pancreatic head hypodensity Etiology likely secondary to EtOH abuse.  Incidental findings on MRI L-spine with hypodensity pancreatic head/uncinate process.  Recommend outpatient MR dedicated pancreas study for further evaluation/surveillance  Essential hypertension BP 102/87, well controlled.  On lisinopril 10 mg p.o. daily at home. --Hold home lisinopril given borderline blood pressures  Type 2 diabetes mellitus Hemoglobin A1c 5.6 02/2016. on Metformin 1000 mg p.o. twice daily at home. --Update hemoglobin A1c --Insulin sliding scale for  coverage while  inpatient  HLD: Continue simvastatin 20 mg p.o. daily  Seizure disorder: Continue Depakote ER 500 mg p.o. daily  Tobacco use disorder: Nicotine patch  GERD: Continue famotidine 20 mg p.o. twice daily   DVT prophylaxis: SCDs Code Status: Full code Family Communication: None present at bedside  Disposition Plan:  Status is: Inpatient  Remains inpatient appropriate because:Ongoing active pain requiring inpatient pain management, Ongoing diagnostic testing needed not appropriate for outpatient work up, Unsafe d/c plan and IV treatments appropriate due to intensity of illness or inability to take PO   Dispo: The patient is from: Home              Anticipated d/c is to: Home              Anticipated d/c date is: > 3 days              Patient currently is not medically stable to d/c.   Consultants:   Neurosurgery - Dr. Annette Stable  Infectious disease  Interventional radiology  Procedures:   TTE: Pending  Antimicrobials:   None   Subjective: Patient seen and examined bedside, resting comfortably.  Continues with some midline low back pain.  Neurosurgery present in room this morning.  Continues to be afebrile.  Awaiting IR aspiration prior to initiation of antibiotics.  Patient denies any saddle anesthesia or lower extremity weakness.  No other complaints or concerns at this time.  Denies headache, no fever/chills/night sweats, no nausea cefonicid/diarrhea, no chest pain, no palpitations, no shortness of breath, no abdominal pain.  No acute events overnight per nursing staff.  Objective: Vitals:   02/06/20 2029 02/06/20 2315 02/07/20 0420 02/07/20 0745  BP: (!) 130/110 (!) 123/97 102/87 96/71  Pulse: (!) 104 (!) 109 96 94  Resp: 17 14 11 20   Temp:  98 F (36.7 C) 99.2 F (37.3 C) 98.6 F (37 C)  TempSrc:  Oral Oral Oral  SpO2: 98% 100% 95% 100%  Weight:      Height:        Intake/Output Summary (Last 24 hours) at 02/07/2020 1020 Last data filed at  02/06/2020 1648 Gross per 24 hour  Intake 1000 ml  Output --  Net 1000 ml   Filed Weights   02/06/20 2013  Weight: 81.6 kg    Examination:  General exam: Appears calm and comfortable, NAD, disheveled in appearance Respiratory system: Clear to auscultation. Respiratory effort normal.  Oxygenating well on room air Cardiovascular system: S1 & S2 heard, RRR. No JVD, murmurs, rubs, gallops or clicks. No pedal edema. Gastrointestinal system: Abdomen is nondistended, soft and nontender. No organomegaly or masses felt. Normal bowel sounds heard. Central nervous system: Alert and oriented. No focal neurological deficits. Extremities: Symmetric 5 x 5 power. Skin: No rashes, lesions or ulcers Psychiatry: Judgement and insight appear poor. Mood & affect appropriate.     Data Reviewed: I have personally reviewed following labs and imaging studies  CBC: Recent Labs  Lab 02/06/20 1206 02/07/20 0029  WBC 9.5 8.2  NEUTROABS  --  6.2  HGB 11.4* 9.9*  HCT 35.7* 30.7*  MCV 97.3 95.6  PLT 499* 517*   Basic Metabolic Panel: Recent Labs  Lab 02/06/20 1206 02/07/20 0029  NA 137 134*  K 3.6 3.8  CL 100 100  CO2 23 25  GLUCOSE 173* 234*  BUN 10 12  CREATININE 0.49* 0.54*  CALCIUM 8.7* 8.7*  MG  --  1.3*  PHOS  --  2.9   GFR: Estimated  Creatinine Clearance: 114.5 mL/min (A) (by C-G formula based on SCr of 0.54 mg/dL (L)). Liver Function Tests: Recent Labs  Lab 02/06/20 1206 02/07/20 0029  AST 13* 13*  ALT 12 11  ALKPHOS 123 113  BILITOT 0.3 0.6  PROT 8.0 7.2  ALBUMIN 2.6* 2.4*   Recent Labs  Lab 02/06/20 1206  LIPASE 84*   Recent Labs  Lab 02/07/20 0029  AMMONIA 22   Coagulation Profile: Recent Labs  Lab 02/07/20 0029  INR 1.1   Cardiac Enzymes: No results for input(s): CKTOTAL, CKMB, CKMBINDEX, TROPONINI in the last 168 hours. BNP (last 3 results) No results for input(s): PROBNP in the last 8760 hours. HbA1C: No results for input(s): HGBA1C in the last 72  hours. CBG: Recent Labs  Lab 02/07/20 0036 02/07/20 0423 02/07/20 0746  GLUCAP 220* 95 146*   Lipid Profile: No results for input(s): CHOL, HDL, LDLCALC, TRIG, CHOLHDL, LDLDIRECT in the last 72 hours. Thyroid Function Tests: Recent Labs    02/07/20 0029  TSH 1.398   Anemia Panel: No results for input(s): VITAMINB12, FOLATE, FERRITIN, TIBC, IRON, RETICCTPCT in the last 72 hours. Sepsis Labs: No results for input(s): PROCALCITON, LATICACIDVEN in the last 168 hours.  Recent Results (from the past 240 hour(s))  Culture, blood (routine x 2)     Status: None (Preliminary result)   Collection Time: 02/06/20  5:05 PM   Specimen: BLOOD  Result Value Ref Range Status   Specimen Description BLOOD RIGHT ANTECUBITAL  Final   Special Requests   Final    BOTTLES DRAWN AEROBIC AND ANAEROBIC Blood Culture adequate volume   Culture   Final    NO GROWTH < 24 HOURS Performed at [facility], [address], [place], [place] [address]    Report Status PENDING  Incomplete  SARS Coronavirus 2 by RT PCR (hospital order, performed in [facility] hospital lab) Nasopharyngeal Nasopharyngeal Swab     Status: None   Collection Time: 02/06/20  6:13 PM   Specimen: Nasopharyngeal Swab  Result Value Ref Range Status   SARS Coronavirus 2 NEGATIVE NEGATIVE Final    Comment: (NOTE) SARS-CoV-2 target nucleic acids are NOT DETECTED. The SARS-CoV-2 RNA is generally detectable in upper and lower respiratory specimens during the acute phase of infection. The lowest concentration of SARS-CoV-2 viral copies this assay can detect is 250 copies / mL. A negative result does not preclude SARS-CoV-2 infection and should not be used as the sole basis for treatment or other patient management decisions.  A negative result may occur with improper specimen collection / handling, submission of specimen other than nasopharyngeal swab, presence of viral mutation(s) within the areas targeted by this assay, and  inadequate number of viral copies (<250 copies / mL). A negative result must be combined with clinical observations, patient history, and epidemiological information. Fact Sheet for Patients:   BoilerBrush.com.cy Fact Sheet for Healthcare Providers: https://pope.com/ This test is not yet approved or cleared  by the [place] FDA and has been authorized for detection and/or diagnosis of SARS-CoV-2 by FDA under an Emergency Use Authorization (EUA).  This EUA will remain in effect (meaning this test can be used) for the duration of the COVID-19 declaration under Section 564(b)(1) of the Act, 21 U.S.C. section 360bbb-3(b)(1), unless the authorization is terminated or revoked sooner. Performed at [facility], [address] [place], [place] [address]   Culture, blood (routine x 2)     Status: None (Preliminary result)  Collection Time: 02/06/20  8:40 PM   Specimen: BLOOD  Result Value Ref Range Status   Specimen Description BLOOD RIGHT ANTECUBITAL  Final   Special Requests   Final    BOTTLES DRAWN AEROBIC AND ANAEROBIC Blood Culture adequate volume   Culture   Final    NO GROWTH < 12 HOURS Performed at [facility], [address], [place], [place] [address]    Report Status PENDING  Incomplete  MRSA PCR Screening     Status: None   Collection Time: 02/06/20 11:22 PM   Specimen: Nasal Mucosa; Nasopharyngeal  Result Value Ref Range Status   MRSA by PCR NEGATIVE NEGATIVE Final    Comment:        The GeneXpert MRSA Assay (FDA approved for NASAL specimens only), is one component of a comprehensive MRSA colonization surveillance program. It is not intended to diagnose MRSA infection nor to guide or monitor treatment for MRSA infections. Performed at [facility], [address] [place], [place] [address]          Radiology Studies: MR THORACIC SPINE W WO CONTRAST  Result Date: 02/06/2020 CLINICAL  DATA:  Back pain and suspected discitis EXAM: MRI THORACIC WITHOUT AND WITH CONTRAST TECHNIQUE: Multiplanar and multiecho pulse sequences of the thoracic spine were obtained without and with intravenous contrast. CONTRAST:  8mL GADAVIST GADOBUTROL 1 MMOL/ML IV SOLN COMPARISON:  None. FINDINGS: MRI THORACIC SPINE FINDINGS Alignment:  Physiologic. Vertebrae: At T9, T10 and T11 there is hyperintense T2-weighted signal, loss of normal T1-weighted signal and diffuse contrast enhancement of the bone marrow. There is fluid within both disc spaces, worse at the lower level. Cord:  Normal signal and morphology. Paraspinal and other soft tissues: Empyema of the right posterior chest measures 4.7 cm. There is a large amount of paravertebral phlegmon at the T9-T11 levels. There is a ventral epidural abscess extending from T8-T11 that measures up to 5 mm in thickness. Disc levels: T9-10 and T10-11 discitis-osteomyelitis and epidural collection results in moderate T9-10 stenosis and severe T10-11 spinal canal stenosis. There is severe bilateral neural foraminal stenosis at both levels. The other thoracic disc spaces are unremarkable. IMPRESSION: 1. 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 There is severe bilateral neural foraminal stenosis at both levels. 2. Large amount of paravertebral phlegmon at the T9-T11 levels. 3. Empyema of the right posterior chest. Electronically Signed   By: Deatra Robinson M.D.   On: 02/06/2020 19:42   CT Abdomen Pelvis W Contrast  Result Date: 02/06/2020 CLINICAL DATA:  Prior abnormal chest x-ray with possible free air. EXAM: CT ABDOMEN AND PELVIS WITH CONTRAST TECHNIQUE: Multidetector CT imaging of the abdomen and pelvis was performed using the standard protocol following bolus administration of intravenous contrast. CONTRAST:  OMNIPAQUE IOHEXOL 300 MG/ML   SOLN COMPARISON:  None. FINDINGS: Lower chest: Lung bases demonstrate a loculated peripherally enhancing fluid collection in the posterior pleural space on the right. This corresponds to the density seen on recent plain film examination. Some associated compressive atelectasis is seen as well. Hepatobiliary: Gallbladder is well distended with multiple dependent gallbladder stones. The liver is mildly fatty infiltrated. Intrahepatic biliary ductal dilatation is seen which may be related to a distal common bile duct stone. Pancreas: Pancreas demonstrates diffuse calcifications as well as mild pancreatic ductal dilatation. Hypodensity is noted in the region of the head and uncinate  process best seen on image number 28 of series 3. This is new from the prior exam and may represent a focal pancreatic head mass. It measures approximately 16 mm. Scattered calcifications are again seen similar to that noted on the prior exam consistent with prior chronic pancreatitis. Additionally previously seen left upper quadrant pseudocyst is noted but improved in size when compared with the prior exam. It lies predominately lateral to the spleen but courses along the anterior aspect of the spleen posterior to the stomach. Small pseudocyst is also noted adjacent to the head of the pancreas best seen on image number 30 of series 3. Spleen: Within normal limits. Adrenals/Urinary Tract: Adrenal glands are stable in appearance. Mild fullness of the left adrenal gland is seen. The kidneys demonstrate a normal enhancement pattern bilaterally. No obstructive changes are seen. Right renal cyst is noted. The bladder is well distended. Stomach/Bowel: Colon shows no obstructive or inflammatory changes. The abnormality seen on recent chest x-ray in the right upper quadrant represents interposition of colon between the diaphragm and anterior aspect of the liver. No free air is seen. No small bowel abnormality is noted. The appendix is unremarkable.  Stomach is decompressed. Vascular/Lymphatic: Atherosclerotic calcifications of the abdominal aorta are noted. 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 This is likely related to the patient's previous pancreatitis episodes. 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 Reproductive: Prostate is unremarkable. Other: No free air or free fluid is noted. Musculoskeletal: There are changes at T9-T10 and T10-T11 with erosive changes of the endplates at both levels and perispinal inflammatory changes and enhancement consistent with discitis and paraspinal abscess. The loculated fluid collection on the right in the pleural space may be related to these changes as well. Degenerative changes of lumbar spine are seen. IMPRESSION: Changes highly suspicious for discitis at T10-T11 and T9-T10 with changes of paraspinal abscesses. The fluid collection loculated in the posterior right pleural space may represent sequelae from these changes as well. MRI of the thoracic spine with contrast is recommended for further evaluation. Changes consistent with chronic pancreatitis and persistent but smaller pseudocysts particularly surrounding the spleen and stomach. Hypodensity is noted in the region of the head/uncinate process as described above. This is somewhat suspicious for underlying mass given the new biliary ductal dilatation. MRI of the pancreas is recommended on an outpatient basis to allow for optimum imaging. Cavernous transformation of the portal vein with increase in the size of perigastric varices. No evidence of free air. The abnormality seen on prior chest x-ray is related to interposition of the colon between the liver in the diaphragm. Electronically Signed   By: Alcide Clever M.D.   On: 02/06/2020 16:06   DG Chest Portable 1 View  Result Date: 02/06/2020 CLINICAL DATA:  Cough EXAM: PORTABLE  CHEST 1 VIEW COMPARISON:  03/08/2016 FINDINGS: Cardiac shadow is within normal limits. The lungs are well aerated bilaterally. Rounded soft tissue density is noted adjacent to the right cardiac border. This was not present on a prior CT examination from 2017. This may represent a rounded pneumonia or possible pericardial cyst lungs are otherwise clear. There is apparent free air under the right hemidiaphragm. IMPRESSION: No focal infiltrate is seen. Findings suspicious for free air under the right hemidiaphragm. Soft tissue density along the right heart border of uncertain significance. CT of the abdomen and pelvis is recommended for further evaluation and characterization of these  findings. Electronically Signed   By: Alcide CleverMark  Lukens M.D.   On: 02/06/2020 14:16        Scheduled Meds: . divalproex  500 mg Oral Daily  . docusate sodium  100 mg Oral BID  . famotidine  20 mg Oral BID  . folic acid  1 mg Oral Daily  . insulin aspart  0-9 Units Subcutaneous Q4H  . LORazepam  0-4 mg Intravenous Q6H   Or  . LORazepam  0-4 mg Oral Q6H  . [START ON 02/09/2020] LORazepam  0-4 mg Intravenous Q12H   Or  . [START ON 02/09/2020] LORazepam  0-4 mg Oral Q12H  . multivitamin with minerals  1 tablet Oral Daily  . nicotine  14 mg Transdermal Once  . nicotine  21 mg Transdermal Daily  . simvastatin  20 mg Oral q1800  . sodium chloride flush  3 mL Intravenous Q12H  . thiamine  100 mg Oral Daily   Or  . thiamine  100 mg Intravenous Daily  . traZODone  150 mg Oral QHS   Continuous Infusions:   LOS: 1 day    Time spent: 39 minutes spent on chart review, discussion with nursing staff, consultants, updating family and interview/physical exam; more than 50% of that time was spent in counseling and/or coordination of care.    Alvira PhilipsEric J [place], DO [facility] Available via Epic secure chat 7am-7pm After these hours, please refer to coverage provider listed on [email or web address] 02/07/2020, 10:20 AM

## 2020-02-07 NOTE — Consult Note (Addendum)
[facility]    Date of Admission:  02/06/2020   Total days of antibiotics: 0             Reason for Consult: Discitis     Referring Provider: Dr. Adela Glimpse Primary Care Provider: Franco Nones, FNP  Assessment: [name] is a 54 y/o gentleman with a significant PMHx of AUD complicated by hepatic cirrhosis and chronic pancreatitis, T2DM, CAD with stent placement in 2017, CVA who presented to the ED 1 day ago for abdominal pain and was found to have discitis on initial imaging. Patient endorses a 1 month history of middle back pain with night sweats, chills.   MRI obtained and showed discitis in the T9-10 and 10-11 with epidural abscess, as well as significant paravertebral phelgmon. Neurosurgery on board. No evidence of acute neurological deficits at this time to indicate need for emergency decompression. Patient also has lung empyema noted on CT chest, measuring approximately 5 cm. On review, it appears to be lateral to affected vertebral bodies. IR has been consulted for drainage. He is asymptomatic in terms of pulmonary function at this time.   At this moment, no obvious triggers including IV drug use, recent medical injections, wounds. He does have poor dentition but no active infections. With empyema noted, most concerning for local spread. Given his heavy etoh use, he is at risk of aspiration which may have lead to empyema.    No leukocytosis and afebrile. Blood cultures NGTD at 12 hours.   Plan: 1. IR guided drainage 2. Defer antibiotic therapy until abscess cultures obtained 3. Plan to start Vancomycin and Ceftriaxone after procedure  4. TTE pending  5. Follow blood cultures   Active Problems:   Hypertension   GERD (gastroesophageal reflux disease)   Chronic Pancreatitis.   Hyperlipidemia   Smoking   CAD in native artery   Alcohol abuse   Tobacco abuse   Diabetes mellitus type 2 in nonobese (HCC)   History of CVA with residual  deficit   Essential hypertension, benign   Diskitis   Alcohol withdrawal delirium (HCC)   Epilepsia (HCC)   Empyema lung (HCC)   . divalproex  500 mg Oral Daily  . docusate sodium  100 mg Oral BID  . famotidine  20 mg Oral BID  . folic acid  1 mg Oral Daily  . insulin aspart  0-9 Units Subcutaneous Q4H  . LORazepam  0-4 mg Intravenous Q6H   Or  . LORazepam  0-4 mg Oral Q6H  . [START ON 02/09/2020] LORazepam  0-4 mg Intravenous Q12H   Or  . [START ON 02/09/2020] LORazepam  0-4 mg Oral Q12H  . multivitamin with minerals  1 tablet Oral Daily  . nicotine  14 mg Transdermal Once  . nicotine  21 mg Transdermal Daily  . simvastatin  20 mg Oral q1800  . sodium chloride flush  3 mL Intravenous Q12H  . thiamine  100 mg Oral Daily   Or  . thiamine  100 mg Intravenous Daily  . traZODone  150 mg Oral QHS    HPI:  Brandon [name] is a 54 y.o. male with a PMHx of AUD, cirrhosis, chronic pancreatitis, T2DM, CAD with stent (2017), CVA who presented to the ED on 02/06/2020 with c/o abdominal pain and currently admitted for evaluation of discitis.   Brandon [name] states that approximately 1 month ago, he noticed significant back pain when trying to get up  from bed. The pain was in his middle back without radiation. The pain is worst at night and when standing up. It is occasionally improved with leaning forward. He endorses night sweats and chills that began around this time and occur intermittently. He denies any known fevers. He endorses generalized weakness that has worsened over the past 1 month and headaches but denies vision changes, dizziness, focal weakness in any extremities, focal numbness including genital or peri-rectal numbness. He denies any known skin breaks, ulcers, rashes. He denies any recent injections, including IV drug use or steroid injections. He states his right upper tooth has been hurting for the past 1 week. Prior to coming to the hospital, he has been having peri-umbilical  abdominal pain without radiation. He denies any N/V/D. He denies any chest pain, SOB, changes in "chronic, occasionally productive smoker's cough."   Brandon [name] states he occasionally uses marijuana and usually drinks daily. Denies any other drug use, including heroine, crack.   Review of Systems: Review of Systems  Constitutional: Positive for chills, diaphoresis and malaise/fatigue. Negative for fever.  HENT:       + dental pain  Eyes: Negative for blurred vision, double vision and photophobia.  Respiratory: Positive for cough and sputum production. Negative for shortness of breath and wheezing.   Cardiovascular: Negative for chest pain and palpitations.  Gastrointestinal: Positive for abdominal pain. Negative for blood in stool, constipation, diarrhea, nausea and vomiting.  Genitourinary: Negative for dysuria, frequency and urgency.  Musculoskeletal: Positive for back pain.  Skin: Negative for itching and rash.  Neurological: Positive for weakness and headaches. Negative for dizziness and focal weakness.  Psychiatric/Behavioral: Positive for substance abuse.    Past Medical History:  Diagnosis Date  . Alcohol dependency (HCC)    Hx ETOH withdrawal seizures before 2011  . CAD (coronary artery disease) 06/13/2012   Calcification noted on CTA of chest in 2012 Wall motion abnormality on ECHO    . Carotid artery disease (HCC) 2016   bilateral.  s/p left carotid stent 03/2015  . 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   Marland Kitchen Depression with anxiety   . GERD (gastroesophageal reflux disease)   . Headache(784.0)    migraine  . Heart disease 02/2015   PCI/DES placed to RCA: on chronic Plavix/ASA  . Hyperlipidemia   . Hypertension   . Pancreatitis 2011....    CT findings in May 2011 with inflammation and pseudocyst.  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/2016  . Renal artery stenosis, native, bilateral (HCC) 02/2015    Social  History   Tobacco Use  . Smoking status: Current Every Day Smoker    Packs/day: 1.00    Years: 30.00    Pack years: 30.00    Types: Cigarettes  . Smokeless tobacco: Never Used  . Tobacco comment: smoking less  Substance Use Topics  . Alcohol use: No    Alcohol/week: 6.0 standard drinks    Types: 6 Cans of beer per week    Comment: drinks 6-12 beer daily  . Drug use: No    Family History  Problem Relation Age of Onset  . Stroke Mother        deceased  . Coronary artery disease Mother   . Alcohol abuse Mother   . Cancer Mother   . Hypertension Father        alive  . Alcohol abuse Father   . Diabetes Father   . Kidney disease  Father   . Stroke Maternal Grandmother    Allergies  Allergen Reactions  . No Known Allergies     OBJECTIVE: Blood pressure 96/72, pulse 90, temperature 97.9 F (36.6 C), resp. rate 16, height 6' (1.829 m), weight 81.6 kg, SpO2 98 %.  Physical Exam Vitals and nursing note reviewed.  Constitutional:      Appearance: He is cachectic. He is ill-appearing (chronically).  HENT:     Head: Normocephalic and atraumatic.     Mouth/Throat:     Mouth: Mucous membranes are moist. No oral lesions.     Dentition: Abnormal dentition. Dental caries present. No dental tenderness, dental abscesses or gum lesions.     Pharynx: Oropharynx is clear. No pharyngeal swelling, oropharyngeal exudate or posterior oropharyngeal erythema.     Tonsils: No tonsillar exudate or tonsillar abscesses.  Cardiovascular:     Rate and Rhythm: Normal rate and regular rhythm.     Heart sounds: No murmur. No gallop.   Pulmonary:     Effort: Pulmonary effort is normal. No respiratory distress.     Breath sounds: Examination of the right-middle field reveals decreased breath sounds. Examination of the right-lower field reveals decreased breath sounds. Decreased breath sounds present. No wheezing or rales.  Abdominal:     General: Bowel sounds are normal. There is no distension.  There are no signs of injury.     Palpations: Abdomen is soft.     Tenderness: There is no abdominal tenderness. There is no guarding or rebound.  Skin:    General: Skin is warm and dry.     Coloration: Skin is pale.     Findings: No abscess, erythema, lesion, petechiae or wound.     Nails: There is no clubbing.  Neurological:     General: No focal deficit present.     Mental Status: He is alert and oriented to person, place, and time.  Psychiatric:        Mood and Affect: Mood normal.        Behavior: Behavior normal. Behavior is cooperative.     Lab Results Lab Results  Component Value Date   WBC 8.2 02/07/2020   HGB 9.9 (L) 02/07/2020   HCT 30.7 (L) 02/07/2020   MCV 95.6 02/07/2020   PLT 407 (H) 02/07/2020    Lab Results  Component Value Date   CREATININE 0.54 (L) 02/07/2020   BUN 12 02/07/2020   NA 134 (L) 02/07/2020   K 3.8 02/07/2020   CL 100 02/07/2020   CO2 25 02/07/2020    Lab Results  Component Value Date   ALT 11 02/07/2020   AST 13 (L) 02/07/2020   ALKPHOS 113 02/07/2020   BILITOT 0.6 02/07/2020    Microbiology: Recent Results (from the past 240 hour(s))  Urine culture     Status: None   Collection Time: 02/06/20  1:24 PM   Specimen: Urine, Random  Result Value Ref Range Status   Specimen Description URINE, RANDOM  Final   Special Requests NONE  Final   Culture   Final    NO GROWTH Performed at [facility], [address], [place], [place] [address]    Report Status 02/07/2020 FINAL  Final  Culture, blood (routine x 2)     Status: None (Preliminary result)   Collection Time: 02/06/20  5:05 PM   Specimen: BLOOD  Result Value Ref Range Status   Specimen Description BLOOD RIGHT ANTECUBITAL  Final   Special Requests   Final  BOTTLES DRAWN AEROBIC AND ANAEROBIC Blood Culture adequate volume   Culture   Final    NO GROWTH < 24 HOURS Performed at [facility], [place] [address] [place], [place] [address]    Report Status PENDING   Incomplete  SARS Coronavirus 2 by RT PCR (hospital order, performed in [facility] hospital lab) Nasopharyngeal Nasopharyngeal Swab     Status: None   Collection Time: 02/06/20  6:13 PM   Specimen: Nasopharyngeal Swab  Result Value Ref Range Status   SARS Coronavirus 2 NEGATIVE NEGATIVE Final    Comment: (NOTE) SARS-CoV-2 target nucleic acids are NOT DETECTED. The SARS-CoV-2 RNA is generally detectable in upper and lower respiratory specimens during the acute phase of infection. The lowest concentration of SARS-CoV-2 viral copies this assay can detect is 250 copies / mL. A negative result does not preclude SARS-CoV-2 infection and should not be used as the sole basis for treatment or other patient management decisions.  A negative result may occur with improper specimen collection / handling, submission of specimen other than nasopharyngeal swab, presence of viral mutation(s) within the areas targeted by this assay, and inadequate number of viral copies (<250 copies / mL). A negative result must be combined with clinical observations, patient history, and epidemiological information. Fact Sheet for Patients:   StrictlyIdeas.no Fact Sheet for Healthcare Providers: BankingDealers.co.za This test is not yet approved or cleared  by the [place] FDA and has been authorized for detection and/or diagnosis of SARS-CoV-2 by FDA under an Emergency Use Authorization (EUA).  This EUA will remain in effect (meaning this test can be used) for the duration of the COVID-19 declaration under Section 564(b)(1) of the Act, 21 U.S.C. section 360bbb-3(b)(1), unless the authorization is terminated or revoked sooner. Performed at [facility], [place] [address] [place], [place] [address]   Culture, blood (routine x 2)     Status: None (Preliminary result)   Collection Time: 02/06/20  8:40 PM   Specimen: BLOOD  Result Value Ref Range Status    Specimen Description BLOOD RIGHT ANTECUBITAL  Final   Special Requests   Final    BOTTLES DRAWN AEROBIC AND ANAEROBIC Blood Culture adequate volume   Culture   Final    NO GROWTH < 12 HOURS Performed at [facility], [place] [address] [place], [place] [address]    Report Status PENDING  Incomplete  MRSA PCR Screening     Status: None   Collection Time: 02/06/20 11:22 PM   Specimen: Nasal Mucosa; Nasopharyngeal  Result Value Ref Range Status   MRSA by PCR NEGATIVE NEGATIVE Final    Comment:        The GeneXpert MRSA Assay (FDA approved for NASAL specimens only), is one component of a comprehensive MRSA colonization surveillance program. It is not intended to diagnose MRSA infection nor to guide or monitor treatment for MRSA infections. Performed at [facility], [place] [address], [place], [place] [address]     Dr. Jose Persia Internal Medicine PGY-1  Pager: [phone] 02/07/2020, 12:29 PM

## 2020-02-07 NOTE — Progress Notes (Signed)
Initial Nutrition Assessment  DOCUMENTATION CODES:   Not applicable  INTERVENTION:  Provide Glucerna Shake po TID, each supplement provides 220 kcal and 10 grams of protein.  Provide 30 ml Prostat po BID, each supplement provides 100 kcal and 15 grams of protein.   Encourage adequate PO intake.   NUTRITION DIAGNOSIS:   Increased nutrient needs related to chronic illness(cirrhosis, pancreatitis) as evidenced by estimated needs.  GOAL:   Patient will meet greater than or equal to 90% of their needs  MONITOR:   PO intake, Supplement acceptance, Skin, Weight trends, Labs, I & O's  REASON FOR ASSESSMENT:   Consult Assessment of nutrition requirement/status  ASSESSMENT:   54 y.o. male with medical history significant of EtOH abuse, CVA, Cirrhosis, seizure disorder, chronic pancreatitis, HTN, DM 2, HLD presents with generalized fatigue, abdominal pain, and back pain. Admitted for T9-T11 discitis and empyema.  Pt unavailable during attempted time of visit. RD unable to obtain pt nutrition history at this time. Plan for IR guided drainage of empyema. RD to order nutritional supplements to aid in increased caloric and protein needs.   Unable to complete Nutrition-Focused physical exam at this time.   Labs and medications reviewed. CBGs 95-323 mg/dL.   Diet Order:   Diet Order            Diet Carb Modified Fluid consistency: Thin; Room service appropriate? Yes  Diet effective now              EDUCATION NEEDS:   Not appropriate for education at this time  Skin:  Skin Assessment: Reviewed RN Assessment  Last BM:  Unknown  Height:   Ht Readings from Last 1 Encounters:  02/06/20 6' (1.829 m)    Weight:   Wt Readings from Last 1 Encounters:  02/06/20 81.6 kg    BMI:  Body mass index is 24.41 kg/m.  Estimated Nutritional Needs:   Kcal:  2200-2450  Protein:  110-125 grams  Fluid:  >/= 2 L/day   Roslyn Smiling, MS, RD, LDN RD pager number/after hours  weekend pager number on Amion.

## 2020-02-07 NOTE — Progress Notes (Signed)
Patient arrived from ED to 4NP14 at 2315. Patient had urinated on himself in transit. He was given a CHG bath, a sacral foam was placed on his backside, and a MRSA swab was sent to lab. Patient expressed to nurse that he drinks four  drinks with hard liquor (vodka) daily. Initial CIWA score of four assessed, requiring no Ativan. Bed is in the lowest position with alarm set and call bell placed within reach.

## 2020-02-07 NOTE — Evaluation (Signed)
Occupational Therapy Evaluation Patient Details Name: Brandon [name] MRN: [id number] DOB: July 07, 1966 Today's Date: 02/07/2020    History of Present Illness Brandon [name] is a 54 year old Caucasian male with past medical history notable for EtOH abuse, CVA, EtOH cirrhosis, seizure disorder, chronic pancreatitis, HTN, type 2 diabetes mellitus, HLD with generalized fatigue, and progressive back pain.  Thought he had exacerbation of his chornic pancreatitis, MR T-spine notable for discitis/osteomyelitis at T9/10 and T10/11 with ventral epidural abscess measuring up to 5 mm in thickness and causing moderate T9/10 and severe T10/11 spinal canal stenosis.   Clinical Impression   PT admitted with LLE weakness pain and decreased activity tolerance. Pt currently with functional limitiations due to the deficits listed below (see OT problem list). Pt currently prefers bed level due to fatigue and discomfort when against gravity. Pt pleasant and agreeable to eob.  Pt will benefit from skilled OT to increase their independence and safety with adls and balance to allow discharge home with supervision from sister.     Follow Up Recommendations  No OT follow up;Supervision - Intermittent    Equipment Recommendations  None recommended by OT    Recommendations for Other Services       Precautions / Restrictions Precautions Precautions: Fall Restrictions Weight Bearing Restrictions: No      Mobility Bed Mobility Overal bed mobility: Needs Assistance Bed Mobility: Supine to Sit;Sit to Supine     Supine to sit: Supervision        Transfers Overall transfer level: Needs assistance   Transfers: Sit to/from Stand Sit to Stand: Min guard         General transfer comment: returned to supine    Balance Overall balance assessment: Needs assistance   Sitting balance-Leahy Scale: Good       Standing balance-Leahy Scale: Fair Standing balance comment: can stand statically without RW,  stood to toilet in bathroom                           ADL either performed or assessed with clinical judgement   ADL Overall ADL's : Needs assistance/impaired Eating/Feeding: Independent   Grooming: Modified independent;Bed level   Upper Body Bathing: Minimal assistance;Bed level   Lower Body Bathing: Moderate assistance;Bed level   Upper Body Dressing : Minimal assistance;Bed level   Lower Body Dressing: Moderate assistance;Bed level                 General ADL Comments: pt with with any attempts at figure 4 crossing     Vision Baseline Vision/History: No visual deficits Patient Visual Report: No change from baseline       Perception     Praxis      Pertinent Vitals/Pain Pain Assessment: 0-10 Pain Score: 5  Pain Location: back Pain Descriptors / Indicators: Guarding;Aching Pain Intervention(s): Monitored during session;Repositioned     Hand Dominance Right   Extremity/Trunk Assessment Upper Extremity Assessment Upper Extremity Assessment: Generalized weakness   Lower Extremity Assessment Lower Extremity Assessment: Defer to PT evaluation LLE Deficits / Details: weakness noted with ambulation and L toe drag, reports going on for months   Cervical / Trunk Assessment Cervical / Trunk Assessment: Other exceptions Cervical / Trunk Exceptions: discitis/osteomyelitis    Communication Communication Communication: No difficulties   Cognition Arousal/Alertness: Awake/alert Behavior During Therapy: Flat affect Overall Cognitive Status: Impaired/Different from baseline Area of Impairment: Orientation  Orientation Level: Time("Thursday")                 General Comments  pt easily distracted this session.pt fatigued from off the unit testing.     Exercises     Shoulder Instructions      Home Living Family/patient expects to be discharged to:: Private residence Living Arrangements: Other  relatives(sister) Available Help at Discharge: Family;Available PRN/intermittently Type of Home: House Home Access: Stairs to enter [employer] of Steps: 5 Entrance Stairs-Rails: Right;Left;Can reach both Home Layout: Two level;Able to live on main level with bedroom/bathroom     Bathroom Shower/Tub: Chief Strategy Officer: Standard Bathroom Accessibility: Yes How Accessible: Accessible via walker Home Equipment: Bedside commode;Walker - 2 wheels   Additional Comments: sister stays with him part of the time.       Prior Functioning/Environment Level of Independence: Independent with assistive device(s)        Comments: enjoys bowling        OT Problem List: Decreased strength;Decreased activity tolerance;Impaired balance (sitting and/or standing);Decreased safety awareness;Decreased knowledge of use of DME or AE;Decreased knowledge of precautions;Pain      OT Treatment/Interventions: Self-care/ADL training;Therapeutic exercise;DME and/or AE instruction;Energy conservation;Manual therapy;Therapeutic activities;Cognitive remediation/compensation;Patient/family education;Balance training    OT Goals(Current goals can be found in the care plan section) Acute Rehab OT Goals Patient Stated Goal: none stated OT Goal Formulation: With patient Time For Goal Achievement: 02/21/20 Potential to Achieve Goals: Good  OT Frequency: Min 2X/week   Barriers to D/C: Decreased caregiver support  "my sister when she is there)       Co-evaluation              AM-PAC OT "6 Clicks" Daily Activity     Outcome Measure Help from another person eating meals?: None Help from another person taking care of personal grooming?: None Help from another person toileting, which includes using toliet, bedpan, or urinal?: A Little Help from another person bathing (including washing, rinsing, drying)?: A Little Help from another person to put on and taking off regular upper  body clothing?: None Help from another person to put on and taking off regular lower body clothing?: A Little 6 Click Score: 21   End of Session Equipment Utilized During Treatment: Rolling walker Nurse Communication: Mobility status;Precautions  Activity Tolerance: Patient limited by fatigue Patient left: in bed;with call bell/phone within reach;with bed alarm set  OT Visit Diagnosis: Unsteadiness on feet (R26.81);Muscle weakness (generalized) (M62.81)                Time: 8657-8469 OT Time Calculation (min): 16 min Charges:  OT General Charges $OT Visit: 1 Visit OT Evaluation $OT Eval Moderate Complexity: 1 Mod   Brynn, OTR/L  Acute Rehabilitation Services Pager: [phone] Office: [phone] .   Mateo Flow 02/07/2020, 3:06 PM

## 2020-02-07 NOTE — Evaluation (Signed)
Clinical/Bedside Swallow Evaluation Patient Details  Name: Brandon [name] MRN: [id number] Date of Birth: 1966-08-13  Today's Date: 02/07/2020 Time: SLP Start Time (ACUTE ONLY): 3016 SLP Stop Time (ACUTE ONLY): 0935 SLP Time Calculation (min) (ACUTE ONLY): 14 min  Past Medical History:  Past Medical History:  Diagnosis Date  . Alcohol dependency (HCC)    Hx ETOH withdrawal seizures before 2011  . CAD (coronary artery disease) 06/13/2012   Calcification noted on CTA of chest in 2012 Wall motion abnormality on ECHO    . Carotid artery disease (HCC) 2016   bilateral.  s/p left carotid stent 03/2015  . 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   Marland Kitchen Depression with anxiety   . GERD (gastroesophageal reflux disease)   . Headache(784.0)    migraine  . Heart disease 02/2015   PCI/DES placed to RCA: on chronic Plavix/ASA  . Hyperlipidemia   . Hypertension   . Pancreatitis 2011....    CT findings in May 2011 with inflammation and pseudocyst.  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/2016  . Renal artery stenosis, native, bilateral (HCC) 02/2015   Past Surgical History:  Past Surgical History:  Procedure Laterality Date  . CARDIAC CATHETERIZATION N/A 03/15/2015   Procedure: Left Heart Cath;  Surgeon: Laurier Nancy, MD;  Location: [facility] INVASIVE CV LAB;  Service: Cardiovascular;  Laterality: N/A;  . CARDIAC CATHETERIZATION N/A 03/16/2015   Procedure: Coronary Stent Intervention;  Surgeon: Alwyn Pea, MD;  Location: [facility] INVASIVE CV LAB;  Service: Cardiovascular;  Laterality: N/A;  . CAROTID ANGIOGRAM N/A 06/15/2012   Procedure: CAROTID ANGIOGRAM;  Surgeon: Chuck Hint, MD;  Location: [facility] CATH LAB;  Service: Cardiovascular;  Laterality: N/A;  . ESOPHAGOGASTRODUODENOSCOPY N/A 03/08/2016   Procedure: ESOPHAGOGASTRODUODENOSCOPY (EGD);  Surgeon: Ruffin Frederick, MD;  Location: [facility] ENDOSCOPY;  Service: Gastroenterology;  Laterality:  N/A;  . none    . PERIPHERAL VASCULAR CATHETERIZATION Left 04/06/2015   Procedure: Carotid PTA/Stent Intervention;  Surgeon: Annice Needy, MD;  Location: [facility] INVASIVE CV LAB;  Service: Cardiovascular;  Laterality: Left;   HPI:  Pt is a 54 yo male presenting with back and abdominal pain; admitted for discitis (T9-T10, T11-T12) with paraspinal abscesses. PMH includes: GERD, EtOH abuse, CVA, Cirrhosis, seizure disorder, chronic pancreatitis, HTN, DM 2, HLD. BSE in June 2017 [facility].   Assessment / Plan / Recommendation Clinical Impression   Pt does not have any overt signs of dysphagia or aspiration when observed with his breakfast tray this morning. He can be impulsive with his rate, saying how hungry he is while eating, but he can clear the solids from his mouth well. No acute SLP needs are identified at this time; however, note that pt is under CIWA protocol. Should he exhibit changes in mentation that could increase his risk for aspiration, MD could consider reordering SLP at that time.   SLP Visit Diagnosis: Dysphagia, unspecified (R13.10)    Aspiration Risk  Mild aspiration risk    Diet Recommendation Regular;Thin liquid   Liquid Administration via: Cup;Straw Medication Administration: Whole meds with liquid Supervision: Patient able to self feed;Intermittent supervision to cue for compensatory strategies Compensations: Slow rate;Small sips/bites Postural Changes: Seated upright at 90 degrees;Remain upright for at least 30 minutes after po intake    Other  Recommendations Oral Care Recommendations: Oral care BID   Follow up Recommendations None      Frequency and Duration  Prognosis Prognosis for Safe Diet Advancement: Good      Swallow Study   General HPI: Pt is a 54 yo male presenting with back and abdominal pain; admitted for discitis (T9-T10, T11-T12) with paraspinal abscesses. PMH includes: GERD, EtOH abuse, CVA, Cirrhosis, seizure disorder, chronic pancreatitis,  HTN, DM 2, HLD. BSE in June 2017 [facility]. Type of Study: Bedside Swallow Evaluation Previous Swallow Assessment: see HPI Diet Prior to this Study: Regular;Thin liquids Temperature Spikes Noted: No Respiratory Status: Room air History of Recent Intubation: No Behavior/Cognition: Alert;Cooperative;Impulsive Oral Cavity Assessment: (pt allowed limited observation, but appears [facility]) Oral Care Completed by SLP: No Oral Cavity - Dentition: Adequate natural dentition Vision: Functional for self-feeding Self-Feeding Abilities: Able to feed self Patient Positioning: Upright in bed Baseline Vocal Quality: Normal    Oral/Motor/Sensory Function Overall Oral Motor/Sensory Function: Within functional limits   Ice Chips Ice chips: Not tested   Thin Liquid Thin Liquid: Within functional limits Presentation: Self Fed;Straw    Nectar Thick Nectar Thick Liquid: Not tested   Honey Thick Honey Thick Liquid: Not tested   Puree Puree: Within functional limits Presentation: Self Fed;Spoon   Solid     Solid: Within functional limits Presentation: Self Fed       Osie Bond., M.A. [place] Pager [phone] Office [phone]  02/07/2020,10:01 AM

## 2020-02-07 NOTE — Progress Notes (Signed)
  Echocardiogram 2D Echocardiogram has been performed.  Celene Skeen 02/07/2020, 8:52 AM

## 2020-02-07 NOTE — Consult Note (Addendum)
Chief Complaint: Empyema  Referring Physician(s): Dr. E. [place]bek[place]  Supervising Physician: Gilmer MorWagner, Jaime  [facility]  History of Present Illness: Brandon [name] is a 54 y.o. male History of EtOH dependency,CVA,  hepatic cirrhosis and chronic pancreatitis. Presented to this facility with generalized fatigue and progressive back pain X 3 months. Found to have a right sided posterior emyema  Team initially requesting a thoracentesis. Empema difficult to visualize with US. IR recommended a CT guided Thoracentesis After discussion with Team request changed to CT guided chest tube.  Past Medical History:  Diagnosis Date  . Alcohol dependency (HCC)    Hx ETOH withdrawal seizures before 2011  . CAD (coronary artery disease) 06/13/2012   Calcification noted on CTA of chest in 2012 Wall motion abnormality on ECHO    . Carotid artery disease (HCC) 2016   bilateral.  s/p left carotid stent 03/2015  . 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   Marland Kitchen. Depression with anxiety   . GERD (gastroesophageal reflux disease)   . Headache(784.0)    migraine  . Heart disease 02/2015   PCI/DES placed to RCA: on chronic Plavix/ASA  . Hyperlipidemia   . Hypertension   . Pancreatitis 2011....    CT findings in May 2011 with inflammation and pseudocyst.  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/2016  . Renal artery stenosis, native, bilateral (HCC) 02/2015    Past Surgical History:  Procedure Laterality Date  . CARDIAC CATHETERIZATION N/A 03/15/2015   Procedure: Left Heart Cath;  Surgeon: Laurier NancyShaukat A Khan, MD;  [facility];  Service: Cardiovascular;  Laterality: N/A;  . CARDIAC CATHETERIZATION N/A 03/16/2015   Procedure: Coronary Stent Intervention;  Surgeon: Alwyn Peawayne D Callwood, MD;  Location: [facility] INVASIVE CV LAB;  Service: Cardiovascular;  Laterality: N/A;  . CAROTID ANGIOGRAM N/A 06/15/2012   Procedure: CAROTID ANGIOGRAM;   Surgeon: Chuck Hinthristopher S Dickson, MD;  [facility];  Service: Cardiovascular;  Laterality: N/A;  . ESOPHAGOGASTRODUODENOSCOPY N/A 03/08/2016   Procedure: ESOPHAGOGASTRODUODENOSCOPY (EGD);  Surgeon: Ruffin FrederickSteven Paul Armbruster, MD;  [facility];  Service: Gastroenterology;  Laterality: N/A;  . none    . PERIPHERAL VASCULAR CATHETERIZATION Left 04/06/2015   Procedure: Carotid PTA/Stent Intervention;  Surgeon: Annice NeedyJason S Dew, MD;  Location: [facility] INVASIVE CV LAB;  Service: Cardiovascular;  Laterality: Left;    Allergies: No known allergies  Medications: Prior to Admission medications   Medication Sig Start Date End Date Taking? Authorizing Provider  busPIRone (BUSPAR) 10 MG tablet Take 10 mg by mouth 2 (two) times daily. 12/28/19  Yes [provider]  clopidogrel (PLAVIX) 75 MG tablet Take 75 mg by mouth daily.    Yes [provider]  cyclobenzaprine (FLEXERIL) 10 MG tablet Take 10 mg by mouth every 12 (twelve) hours. 01/08/17  Yes [provider]  famotidine (PEPCID) 20 MG tablet Take 20 mg by mouth 2 (two) times daily.  01/08/17  Yes [provider]  lisinopril (PRINIVIL,ZESTRIL) 10 MG tablet Take 10 mg by mouth daily.  01/12/17  Yes [provider]  metFORMIN (GLUCOPHAGE) 1000 MG tablet Take 1 tablet (1,000 mg total) by mouth 2 (two) times daily with a meal. 03/29/16  Yes Angiulli, Mcarthur Rossettianiel J, PA-C  metoprolol tartrate (LOPRESSOR) 100 MG tablet Take 100 mg by mouth daily.   Yes [provider]  simvastatin (ZOCOR) 20 MG tablet Take 20 mg by mouth daily at 6 PM.  Yes [provider]     Family History  Problem Relation Age of Onset  . Stroke Mother        deceased  . Coronary artery disease Mother   . Alcohol abuse Mother   . Cancer Mother   . Hypertension Father        alive  . Alcohol abuse Father   . Diabetes Father   . Kidney disease Father   . Stroke Maternal Grandmother     Social History   Socioeconomic  History  . Marital status: Single    Spouse name: Brandon [name]  . Number of children: 0  . Years of education: 42  . Highest education level: Not on file  Occupational History  . Occupation: unemployed    Comment: previously worked at [employer]  . Smoking status: Current Every Day Smoker    Packs/day: 1.00    Years: 30.00    Pack years: 30.00    Types: Cigarettes  . Smokeless tobacco: Never Used  . Tobacco comment: smoking less  Substance and Sexual Activity  . Alcohol use: No    Alcohol/week: 6.0 standard drinks    Types: 6 Cans of beer per week    Comment: drinks 6-12 beer daily  . Drug use: No  . Sexual activity: Yes    Birth control/protection: None  Other Topics Concern  . Not on file  Social History Narrative   Lives in [place] with his sister.   Unemployed, previously worked Surveyor, minerals down [employer]   Completed high school and some college    Patient is right-handed.   Patients one cup of coffee every morning.      Social Determinants of Health   Financial Resource Strain:   . Difficulty of Paying Living Expenses:   Food Insecurity:   . Worried About Programme researcher, broadcasting/film/video in the Last Year:   . Barista in the Last Year:   Transportation Needs:   . Freight forwarder (Medical):   Marland Kitchen Lack of Transportation (Non-Medical):   Physical Activity:   . Days of Exercise per Week:   . Minutes of Exercise per Session:   Stress:   . Feeling of Stress :   Social Connections:   . Frequency of Communication with Friends and Family:   . Frequency of Social Gatherings with Friends and Family:   . Attends Religious Services:   . Active Member of Clubs or Organizations:   . Attends Banker Meetings:   Marland Kitchen Marital Status:    Review of Systems: A 12 point ROS discussed and pertinent positives are indicated in the HPI above.  All other systems are negative.  Review of Systems  Constitutional: Negative for fever.  HENT: Negative for  congestion.   Respiratory: Negative for cough and shortness of breath.   Cardiovascular: Negative for chest pain.  Gastrointestinal: Negative for abdominal pain.  Musculoskeletal: Positive for back pain.  Neurological: Negative for headaches.  Psychiatric/Behavioral: Negative for behavioral problems and confusion.    Vital Signs: BP 96/72 (BP Location: Right Arm)   Pulse 90   Temp 97.9 F (36.6 C)   Resp 16   Ht 6' (1.829 m)   Wt 180 lb (81.6 kg)   SpO2 98%   BMI 24.41 kg/m   Physical Exam Vitals and nursing note reviewed.  Constitutional:      Appearance: He is well-developed.  HENT:     Head: Normocephalic.  Cardiovascular:  Rate and Rhythm: Normal rate and regular rhythm.  Pulmonary:     Effort: Pulmonary effort is normal.  Musculoskeletal:        General: Normal range of motion.     Cervical back: Normal range of motion.  Skin:    General: Skin is dry.  Neurological:     Mental Status: He is alert and oriented to person, place, and time.     Imaging: MR THORACIC SPINE W WO CONTRAST  Result Date: 02/06/2020 CLINICAL DATA:  Back pain and suspected discitis EXAM: MRI THORACIC WITHOUT AND WITH CONTRAST TECHNIQUE: Multiplanar and multiecho pulse sequences of the thoracic spine were obtained without and with intravenous contrast. CONTRAST:  8mL GADAVIST GADOBUTROL 1 MMOL/ML IV SOLN COMPARISON:  None. FINDINGS: MRI THORACIC SPINE FINDINGS Alignment:  Physiologic. Vertebrae: At T9, T10 and T11 there is hyperintense T2-weighted signal, loss of normal T1-weighted signal and diffuse contrast enhancement of the bone marrow. There is fluid within both disc spaces, worse at the lower level. Cord:  Normal signal and morphology. Paraspinal and other soft tissues: Empyema of the right posterior chest measures 4.7 cm. There is a large amount of paravertebral phlegmon at the T9-T11 levels. There is a ventral epidural abscess extending from T8-T11 that measures up to 5 mm in thickness.  Disc levels: T9-10 and T10-11 discitis-osteomyelitis and epidural collection results in moderate T9-10 stenosis and severe T10-11 spinal canal stenosis. There is severe bilateral neural foraminal stenosis at both levels. The other thoracic disc spaces are unremarkable. IMPRESSION: 1. 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 There is severe bilateral neural foraminal stenosis at both levels. 2. Large amount of paravertebral phlegmon at the T9-T11 levels. 3. Empyema of the right posterior chest. Electronically Signed   By: Deatra Robinson M.D.   On: 02/06/2020 19:42   CT Abdomen Pelvis W Contrast  Result Date: 02/06/2020 CLINICAL DATA:  Prior abnormal chest x-ray with possible free air. EXAM: CT ABDOMEN AND PELVIS WITH CONTRAST TECHNIQUE: Multidetector CT imaging of the abdomen and pelvis was performed using the standard protocol following bolus administration of intravenous contrast. CONTRAST:  OMNIPAQUE IOHEXOL 300 MG/ML  SOLN COMPARISON:  None. FINDINGS: Lower chest: Lung bases demonstrate a loculated peripherally enhancing fluid collection in the posterior pleural space on the right. This corresponds to the density seen on recent plain film examination. Some associated compressive atelectasis is seen as well. Hepatobiliary: Gallbladder is well distended with multiple dependent gallbladder stones. The liver is mildly fatty infiltrated. Intrahepatic biliary ductal dilatation is seen which may be related to a distal common bile duct stone. Pancreas: Pancreas demonstrates diffuse calcifications as well as mild pancreatic ductal dilatation. Hypodensity is noted in the region of the head and uncinate process best seen on image number 28 of series 3. This is new from the prior exam and may represent a focal pancreatic head mass. It measures approximately 16 mm. Scattered  calcifications are again seen similar to that noted on the prior exam consistent with prior chronic pancreatitis. Additionally previously seen left upper quadrant pseudocyst is noted but improved in size when compared with the prior exam. It lies predominately lateral to the spleen but courses along the anterior aspect of the spleen posterior to the stomach. Small pseudocyst is also noted adjacent to the head of the pancreas best seen on image number 30 of series 3. Spleen: Within normal limits. Adrenals/Urinary  Tract: Adrenal glands are stable in appearance. Mild fullness of the left adrenal gland is seen. The kidneys demonstrate a normal enhancement pattern bilaterally. No obstructive changes are seen. Right renal cyst is noted. The bladder is well distended. Stomach/Bowel: Colon shows no obstructive or inflammatory changes. The abnormality seen on recent chest x-ray in the right upper quadrant represents interposition of colon between the diaphragm and anterior aspect of the liver. No free air is seen. No small bowel abnormality is noted. The appendix is unremarkable. Stomach is decompressed. Vascular/Lymphatic: Atherosclerotic calcifications of the abdominal aorta are noted. 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 This is likely related to the patient's previous pancreatitis episodes. 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 Reproductive: Prostate is unremarkable. Other: No free air or free fluid is noted. Musculoskeletal: There are changes at T9-T10 and T10-T11 with erosive changes of the endplates at both levels and perispinal inflammatory changes and enhancement consistent with discitis and paraspinal abscess. The loculated fluid collection on the right in the pleural space may be related to these changes as well. Degenerative changes of lumbar spine are seen. IMPRESSION:  Changes highly suspicious for discitis at T10-T11 and T9-T10 with changes of paraspinal abscesses. The fluid collection loculated in the posterior right pleural space may represent sequelae from these changes as well. MRI of the thoracic spine with contrast is recommended for further evaluation. Changes consistent with chronic pancreatitis and persistent but smaller pseudocysts particularly surrounding the spleen and stomach. Hypodensity is noted in the region of the head/uncinate process as described above. This is somewhat suspicious for underlying mass given the new biliary ductal dilatation. MRI of the pancreas is recommended on an outpatient basis to allow for optimum imaging. Cavernous transformation of the portal vein with increase in the size of perigastric varices. No evidence of free air. The abnormality seen on prior chest x-ray is related to interposition of the colon between the liver in the diaphragm. Electronically Signed   By: Alcide Clever M.D.   On: 02/06/2020 16:06   DG Chest Portable 1 View  Result Date: 02/06/2020 CLINICAL DATA:  Cough EXAM: PORTABLE CHEST 1 VIEW COMPARISON:  03/08/2016 FINDINGS: Cardiac shadow is within normal limits. The lungs are well aerated bilaterally. Rounded soft tissue density is noted adjacent to the right cardiac border. This was not present on a prior CT examination from 2017. This may represent a rounded pneumonia or possible pericardial cyst lungs are otherwise clear. There is apparent free air under the right hemidiaphragm. IMPRESSION: No focal infiltrate is seen. Findings suspicious for free air under the right hemidiaphragm. Soft tissue density along the right heart border of uncertain significance. CT of the abdomen and pelvis is recommended for further evaluation and characterization of these findings. Electronically Signed   By: Alcide Clever M.D.   On: 02/06/2020 14:16   ECHOCARDIOGRAM COMPLETE  Result Date: 02/07/2020    ECHOCARDIOGRAM REPORT    Patient Name:   Brandon [name] Date of Exam: 02/07/2020 Medical Rec #:  [id number]        Height:       72.0 in Accession #:    [id number]       Weight:       180.0 lb Date of Birth:  01-26-1966        BSA:          2.037 m Patient Age:    59  years         BP:           96/71 mmHg Patient Gender: M                HR:           94 bpm. Exam Location:  Inpatient Procedure: 2D Echo Indications:    diskitis  History:        Patient has prior history of Echocardiogram examinations, most                 recent 03/07/2016. CAD; Risk Factors:Hypertension, Dyslipidemia                 and Current Smoker. CVA. alcohol dependency.  Sonographer:    Celene Skeen RDCS (AE) Referring Phys: [id number] ANASTASSIA DOUTOVA  Sonographer Comments: poor positioning (see comments) IMPRESSIONS  1. Left ventricular ejection fraction, by estimation, is 55 to 60%. The left ventricle has normal function. The left ventricle has no regional wall motion abnormalities. Left ventricular diastolic parameters were normal.  2. Right ventricular systolic function is normal. The right ventricular size is normal.  3. Left atrial size was mildly dilated.  4. The mitral valve is normal in structure. Trivial mitral valve regurgitation. No evidence of mitral stenosis.  5. The aortic valve is grossly normal. Aortic valve regurgitation is not visualized. Mild aortic valve sclerosis is present, with no evidence of aortic valve stenosis. Comparison(s): No significant change from prior study. FINDINGS  Left Ventricle: Left ventricular ejection fraction, by estimation, is 55 to 60%. The left ventricle has normal function. The left ventricle has no regional wall motion abnormalities. The left ventricular internal cavity size was normal in size. There is  no left ventricular hypertrophy. Left ventricular diastolic parameters were normal. Right Ventricle: The right ventricular size is normal. No increase in right ventricular wall thickness. Right ventricular systolic  function is normal. Left Atrium: Left atrial size was mildly dilated. Right Atrium: Right atrial size was normal in size. Pericardium: There is no evidence of pericardial effusion. Mitral Valve: The mitral valve is normal in structure. Trivial mitral valve regurgitation. No evidence of mitral valve stenosis. Tricuspid Valve: The tricuspid valve is normal in structure. Tricuspid valve regurgitation is trivial. No evidence of tricuspid stenosis. Aortic Valve: The aortic valve is grossly normal. Aortic valve regurgitation is not visualized. Mild aortic valve sclerosis is present, with no evidence of aortic valve stenosis. There is mild calcification of the aortic valve. Pulmonic Valve: The pulmonic valve was not well visualized. Pulmonic valve regurgitation is not visualized. No evidence of pulmonic stenosis. Aorta: The aortic root is normal in size and structure. Venous: The inferior vena cava was not well visualized. IAS/Shunts: No atrial level shunt detected by color flow Doppler.  LEFT VENTRICLE PLAX 2D LVIDd:         4.40 cm  Diastology LVIDs:         3.10 cm  LV e' lateral:   11.90 cm/s LV PW:         1.10 cm  LV E/e' lateral: 6.6 LV IVS:        1.10 cm  LV e' medial:    7.94 cm/s LVOT diam:     2.20 cm  LV E/e' medial:  9.9 LV SV:         72 LV SV Index:   35 LVOT Area:     3.80 cm  RIGHT VENTRICLE RV S prime:     20.80  cm/s TAPSE (M-mode): 2.0 cm LEFT ATRIUM             Index LA diam:        3.40 cm 1.67 cm/m LA Vol (A2C):   44.3 ml 21.74 ml/m LA Vol (A4C):   54.4 ml 26.68 ml/m LA Biplane Vol: 55.6 ml 27.29 ml/m  AORTIC VALVE LVOT Vmax:   77.00 cm/s LVOT Vmean:  59.700 cm/s LVOT VTI:    0.189 m  AORTA Ao Root diam: 3.70 cm MITRAL VALVE MV Area (PHT): 5.54 cm    SHUNTS MV Decel Time: 137 msec    Systemic VTI:  0.19 m MV E velocity: 78.40 cm/s  Systemic Diam: 2.20 cm MV A velocity: 85.70 cm/s MV E/A ratio:  0.91 Jodelle Red MD Electronically signed by Jodelle Red MD Signature Date/Time:  02/07/2020/1:51:11 PM    Final    IR US CHEST  Result Date: 02/07/2020 CLINICAL DATA:  Thoracic spondylo discitis, loculated right pleural collection concerning for empyema. EXAM: CHEST ULTRASOUND COMPARISON:  02/06/2020 FINDINGS: Ultrasound performed of the chest. Unfortunately unable to visualize the small loculated posteromedial fluid collection within the chest by ultrasound. Thoracentesis not performed. IMPRESSION: Unable to visualize the small loculated posteromedial right pleural fluid collection. Electronically Signed   By: Judie Petit.  Shick M.D.   On: 02/07/2020 13:48    Labs:  CBC: Recent Labs    02/06/20 1206 02/07/20 0029  WBC 9.5 8.2  HGB 11.4* 9.9*  HCT 35.7* 30.7*  PLT 499* 407*    COAGS: Recent Labs    02/07/20 0029  INR 1.1    BMP: Recent Labs    02/06/20 1206 02/07/20 0029  NA 137 134*  K 3.6 3.8  CL 100 100  CO2 23 25  GLUCOSE 173* 234*  BUN 10 12  CALCIUM 8.7* 8.7*  CREATININE 0.49* 0.54*  GFRNONAA >60 >60  GFRAA >60 >60    LIVER FUNCTION TESTS: Recent Labs    02/06/20 1206 02/07/20 0029  BILITOT 0.3 0.6  AST 13* 13*  ALT 12 11  ALKPHOS 123 113  PROT 8.0 7.2  ALBUMIN 2.6* 2.4*    TUMOR MARKERS: No results for input(s): AFPTM, CEA, CA199, CHROMGRNA in the last 8760 hours.  Assessment and Plan: 55 y.o, male inpatient. History of EtOH dependency,CVA,  hepatic cirrhosis and chronic pancreatitis. Presented to this facility with generalized fatigue and progressive back pain X 3 months. Found to have a right sided posterior emyema  Team initially requesting a thoracentesis. Empema difficult to visualize with [place]. IR recommended a CT guided Thoracentesis After discussion with Team request changed to CT guided chest tube.    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 There is severe bilateral neural foraminal stenosis at both levels.  Pertinent IR History none  Pertinent Allergies NKDA  AST 13, Cr 0.54, Blood cultures negative X 1 day.  All labs are within acceptable parameters.  Patient is afebrile. Patient is on plavix but was not talking the medication prior to admission  IR consulted for possible right sided Chest tube. Case has been reviewed and procedure approved by Dr. Loreta Ave. Should the diagnostics be revealing procedure request for T9-T10 aspiration or possible bone biopsy will be reviewed. Team is in agreement with the plan of  care    Patient tentatively scheduled for 5.18.21.  Team instructed to: Keep Patient to be NPO after midnight Hold prophylactic anticoagulation 24 hours prior to scheduled procedure.   IR will call patient when ready.  Risks and benefits discussed with the patient including bleeding, infection, damage to adjacent structures, bowel perforation/fistula connection, and sepsis.  All of the patient's questions were answered, patient is agreeable to proceed.  Consent signed and in chart.     Thank you for this interesting consult.  I greatly enjoyed meeting JADIEL SCHMIEDER and look forward to participating in their care.  A copy of this report was sent to the requesting provider on this date.  Electronically Signed: Avel Peace, NP 02/07/2020, 4:05 PM   I spent a total of 40 Minutes    in face to face in clinical consultation, greater than 50% of which was counseling/coordinating care for chest tube placement right sided

## 2020-02-08 ENCOUNTER — Inpatient Hospital Stay (HOSPITAL_COMMUNITY): Payer: Medicaid Other

## 2020-02-08 DIAGNOSIS — G40909 Epilepsy, unspecified, not intractable, without status epilepticus: Secondary | ICD-10-CM

## 2020-02-08 LAB — BASIC METABOLIC PANEL
Anion gap: 9 (ref 5–15)
BUN: 13 mg/dL (ref 6–20)
CO2: 25 mmol/L (ref 22–32)
Calcium: 8.7 mg/dL — ABNORMAL LOW (ref 8.9–10.3)
Chloride: 101 mmol/L (ref 98–111)
Creatinine, Ser: 0.58 mg/dL — ABNORMAL LOW (ref 0.61–1.24)
GFR calc Af Amer: >60 mL/min (ref >60)
GFR calc non Af Amer: >60 mL/min (ref >60)
Glucose, Bld: 179 mg/dL — ABNORMAL HIGH (ref 70–99)
Potassium: 4.2 mmol/L (ref 3.5–5.1)
Sodium: 135 mmol/L (ref 135–145)

## 2020-02-08 LAB — CBC
HCT: 33.6 % — ABNORMAL LOW (ref 39.0–52.0)
Hemoglobin: 10.6 g/dL — ABNORMAL LOW (ref 13.0–17.0)
MCH: 31.2 pg (ref 26.0–34.0)
MCHC: 31.5 g/dL (ref 30.0–36.0)
MCV: 98.8 fL (ref 80.0–100.0)
Platelets: 407 10*3/uL — ABNORMAL HIGH (ref 150–400)
RBC: 3.4 MIL/uL — ABNORMAL LOW (ref 4.22–5.81)
RDW: 17.7 % — ABNORMAL HIGH (ref 11.5–15.5)
WBC: 9.2 10*3/uL (ref 4.0–10.5)
nRBC: 0 % (ref 0.0–0.2)

## 2020-02-08 LAB — SEDIMENTATION RATE: Sed Rate: 80 mm/hr — ABNORMAL HIGH (ref 0–16)

## 2020-02-08 LAB — GLUCOSE, CAPILLARY
Glucose-Capillary: 130 mg/dL — ABNORMAL HIGH (ref 70–99)
Glucose-Capillary: 134 mg/dL — ABNORMAL HIGH (ref 70–99)
Glucose-Capillary: 177 mg/dL — ABNORMAL HIGH (ref 70–99)
Glucose-Capillary: 198 mg/dL — ABNORMAL HIGH (ref 70–99)
Glucose-Capillary: 209 mg/dL — ABNORMAL HIGH (ref 70–99)
Glucose-Capillary: 231 mg/dL — ABNORMAL HIGH (ref 70–99)
Glucose-Capillary: 291 mg/dL — ABNORMAL HIGH (ref 70–99)

## 2020-02-08 LAB — C-REACTIVE PROTEIN: CRP: 6.7 mg/dL — ABNORMAL HIGH (ref <1.0)

## 2020-02-08 LAB — MAGNESIUM: Magnesium: 1.5 mg/dL — ABNORMAL LOW (ref 1.7–2.4)

## 2020-02-08 MED ORDER — SODIUM CHLORIDE 0.9 % IV SOLN
2.0000 g | INTRAVENOUS | Status: DC
  Administered 2020-02-08 – 2020-02-19 (×10): 2 g via INTRAVENOUS
  Filled 2020-02-08 (×12): qty 20

## 2020-02-08 MED ORDER — MAGNESIUM SULFATE 2 GM/50ML IV SOLN
2.0000 g | INTRAVENOUS | Status: AC
  Administered 2020-02-08 (×2): 2 g via INTRAVENOUS
  Filled 2020-02-08 (×2): qty 50

## 2020-02-08 MED ORDER — FENTANYL CITRATE (PF) 100 MCG/2ML IJ SOLN
INTRAMUSCULAR | Status: AC
  Filled 2020-02-08: qty 2

## 2020-02-08 MED ORDER — VANCOMYCIN HCL IN DEXTROSE 1-5 GM/200ML-% IV SOLN
1000.0000 mg | Freq: Three times a day (TID) | INTRAVENOUS | Status: DC
  Administered 2020-02-08 – 2020-02-09 (×3): 1000 mg via INTRAVENOUS
  Filled 2020-02-08 (×3): qty 200

## 2020-02-08 MED ORDER — FENTANYL CITRATE (PF) 100 MCG/2ML IJ SOLN
INTRAMUSCULAR | Status: AC | PRN
  Administered 2020-02-08: 50 ug via INTRAVENOUS

## 2020-02-08 MED ORDER — MIDAZOLAM HCL 2 MG/2ML IJ SOLN
INTRAMUSCULAR | Status: AC
  Filled 2020-02-08: qty 2

## 2020-02-08 MED ORDER — LIDOCAINE HCL 1 % IJ SOLN
INTRAMUSCULAR | Status: AC
  Filled 2020-02-08: qty 20

## 2020-02-08 MED ORDER — SODIUM CHLORIDE 0.9 % IV SOLN: INTRAVENOUS | Status: AC

## 2020-02-08 MED ORDER — SODIUM CHLORIDE 0.9 % IV BOLUS
1000.0000 mL | Freq: Once | INTRAVENOUS | Status: AC
  Administered 2020-02-08: 1000 mL via INTRAVENOUS

## 2020-02-08 NOTE — Procedures (Signed)
Interventional Radiology Procedure Note  Procedure: Placement of a right empyema drain, 44F.  Culture sent.   Findings:  Frank pus aspirated.    Complications: None  Recommendations:  - To pleural evac chamber.  - Do not submerge - Routine care   Signed,  Yvone Neu. Loreta Ave, DO

## 2020-02-08 NOTE — Sedation Documentation (Signed)
Spoke with Dr. Loreta Ave prior to procedure about patient's blood pressures running 80's/60's.  MD did not want to sedate due to low blood pressure and requested just fentanyl be given.

## 2020-02-08 NOTE — Sedation Documentation (Signed)
Gave report to [place], Charity fundraiser.  Spoke with charge nurse, Meka, about patient's vital signs upon arrival to radiology and throughout the case.

## 2020-02-08 NOTE — Progress Notes (Signed)
[facility]  Date of Admission:  02/06/2020      Total days of antibiotics: 1        Day 1: Vancomycin        Day 1: Ceftriaxone ASSESSMENT: 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 Patient endorses a 1 month history of middle back pain with night sweats, chills.   Empyema drained today with IR; initial studies show frank pus was drained. Gram stain shows abundant gram positive cocci in chains, likely Strep. This is consistent with possible aspiration pneumonia complicated by empyema. Disc space drainage will not be pursued at this time, as imaging was reviewed by IR and feel that the two pockets are contiguous. Blood cultures continued to be NGTD @ 24 hours. Patient remains afebrile with no leukocytosis. No acute neurological deficits to warrant emergency neurosurgical intervention. Will begin empiric antibiotic therapy and narrow as results become available from culture.   PLAN: 1. Follow empyema and blood cultures 2. Start Ceftriaxone and Vancomycin   Active Problems:   Hypertension   GERD (gastroesophageal reflux disease)   Chronic Pancreatitis.   Hyperlipidemia   Smoking   CAD in native artery   Alcohol abuse   Tobacco abuse   Diabetes mellitus type 2 in nonobese (HCC)   History of CVA with residual deficit   Essential hypertension, benign   Diskitis   Alcohol withdrawal delirium (HCC)   Epilepsia (HCC)   Empyema lung (HCC)   . divalproex  500 mg Oral Daily  . docusate sodium  100 mg Oral BID  . famotidine  20 mg Oral BID  . feeding supplement (GLUCERNA SHAKE)  237 mL Oral TID BM  . feeding supplement (PRO-STAT SUGAR FREE 64)  30 mL Oral BID  . fentaNYL      . folic acid  1 mg Oral Daily  .  insulin aspart  0-9 Units Subcutaneous Q4H  . lidocaine      . LORazepam  0-4 mg Intravenous Q6H   Or  . LORazepam  0-4 mg Oral Q6H  . [START ON 02/09/2020] LORazepam  0-4 mg Intravenous Q12H   Or  . [START ON 02/09/2020] LORazepam  0-4 mg Oral Q12H  . multivitamin with minerals  1 tablet Oral Daily  . nicotine  21 mg Transdermal Daily  . simvastatin  20 mg Oral q1800  . sodium chloride flush  3 mL Intravenous Q12H  . thiamine  100 mg Oral Daily   Or  . thiamine  100 mg Intravenous Daily  . traZODone  150 mg Oral QHS    SUBJECTIVE: No acute complaints this AM. Patient states his procedure went well without complications.   Review of Systems: Review of Systems  Constitutional: Negative for chills and fever.  Respiratory: Negative for shortness of breath.   Musculoskeletal: Negative for back pain.  Neurological: Negative for focal weakness.  All other systems reviewed and are negative.  Allergies  Allergen Reactions  . No Known Allergies     OBJECTIVE: Vitals:   02/08/20 0945 02/08/20 1100 02/08/20 1124 02/08/20 1159  BP: (!) 80/67 107/72 (!) 82/72 110/88  Pulse: 98 (!) 114 (!) 102 (!) 103  Resp: 16 16 15  17  Temp:  98 F (36.7 C) 97.7 F (36.5 C) 97.7 F (36.5 C)  TempSrc:  Oral Oral Oral  SpO2: 100% 100% 100% 98%  Weight:      Height:       Body mass index is 24.41 kg/m.  Physical Exam Vitals and nursing note reviewed.  Constitutional:      General: He is not in acute distress.    Appearance: He is normal weight.     Comments: Up in chair and request food  HENT:     Head: Normocephalic and atraumatic.  Pulmonary:     Effort: Pulmonary effort is normal. No respiratory distress.  Skin:    General: Skin is warm and dry.  Neurological:     Mental Status: He is alert and oriented to person, place, and time.  Psychiatric:        Mood and Affect: Mood normal.        Behavior: Behavior normal.    Lab Results Lab Results  Component Value Date   WBC 9.2  02/08/2020   HGB 10.6 (L) 02/08/2020   HCT 33.6 (L) 02/08/2020   MCV 98.8 02/08/2020   PLT 407 (H) 02/08/2020    Lab Results  Component Value Date   CREATININE 0.58 (L) 02/08/2020   BUN 13 02/08/2020   NA 135 02/08/2020   K 4.2 02/08/2020   CL 101 02/08/2020   CO2 25 02/08/2020    Lab Results  Component Value Date   ALT 11 02/07/2020   AST 13 (L) 02/07/2020   ALKPHOS 113 02/07/2020   BILITOT 0.6 02/07/2020     Microbiology: Recent Results (from the past 240 hour(s))  Urine culture     Status: None   Collection Time: 02/06/20  1:24 PM   Specimen: Urine, Random  Result Value Ref Range Status   Specimen Description URINE, RANDOM  Final   Special Requests NONE  Final   Culture   Final    NO GROWTH Performed at [facility], [address], [place], [place] [address]    Report Status 02/07/2020 FINAL  Final  Culture, blood (routine x 2)     Status: None (Preliminary result)   Collection Time: 02/06/20  5:05 PM   Specimen: BLOOD  Result Value Ref Range Status   Specimen Description BLOOD RIGHT ANTECUBITAL  Final   Special Requests   Final    BOTTLES DRAWN AEROBIC AND ANAEROBIC Blood Culture adequate volume   Culture   Final    NO GROWTH 2 DAYS Performed at [facility], [place] [address] [place], [place] [address]    Report Status PENDING  Incomplete  SARS Coronavirus 2 by RT PCR (hospital order, performed in [facility] hospital lab) Nasopharyngeal Nasopharyngeal Swab     Status: None   Collection Time: 02/06/20  6:13 PM   Specimen: Nasopharyngeal Swab  Result Value Ref Range Status   SARS Coronavirus 2 NEGATIVE NEGATIVE Final    Comment: (NOTE) SARS-CoV-2 target nucleic acids are NOT DETECTED. The SARS-CoV-2 RNA is generally detectable in upper and lower respiratory specimens during the acute phase of infection. The lowest concentration of SARS-CoV-2 viral copies this assay can detect is 250 copies / mL. A negative result does not preclude SARS-CoV-2  infection and should not be used as the sole basis for treatment or other patient management decisions.  A negative result may occur with improper specimen collection / handling, submission of specimen other than nasopharyngeal swab, presence of viral  mutation(s) within the areas targeted by this assay, and inadequate number of viral copies (<250 copies / mL). A negative result must be combined with clinical observations, patient history, and epidemiological information. Fact Sheet for Patients:   BoilerBrush.com.cy Fact Sheet for Healthcare Providers: https://pope.com/ This test is not yet approved or cleared  by the [place] FDA and has been authorized for detection and/or diagnosis of SARS-CoV-2 by FDA under an Emergency Use Authorization (EUA).  This EUA will remain in effect (meaning this test can be used) for the duration of the COVID-19 declaration under Section 564(b)(1) of the Act, 21 U.S.C. section 360bbb-3(b)(1), unless the authorization is terminated or revoked sooner. Performed at [facility], [address], [place], [place] [address]   Culture, blood (routine x 2)     Status: None (Preliminary result)   Collection Time: 02/06/20  8:40 PM   Specimen: BLOOD  Result Value Ref Range Status   Specimen Description BLOOD RIGHT ANTECUBITAL  Final   Special Requests   Final    BOTTLES DRAWN AEROBIC AND ANAEROBIC Blood Culture adequate volume   Culture   Final    NO GROWTH 2 DAYS Performed at [facility], [address], [place], [place] [address]    Report Status PENDING  Incomplete  MRSA PCR Screening     Status: None   Collection Time: 02/06/20 11:22 PM   Specimen: Nasal Mucosa; Nasopharyngeal  Result Value Ref Range Status   MRSA by PCR NEGATIVE NEGATIVE Final    Comment:        The GeneXpert MRSA Assay (FDA approved for NASAL specimens only), is one component of a comprehensive MRSA  colonization surveillance program. It is not intended to diagnose MRSA infection nor to guide or monitor treatment for MRSA infections. Performed at [facility], [address] [place], [place] [address]   Aerobic/Anaerobic Culture (surgical/deep wound)     Status: None (Preliminary result)   Collection Time: 02/08/20  9:56 AM   Specimen: Abscess  Result Value Ref Range Status   Specimen Description ABSCESS RIGHT PLEURAL  Final   Special Requests NONE  Final   Gram Stain   Final    ABUNDANT WBC PRESENT, PREDOMINANTLY PMN ABUNDANT GRAM POSITIVE COCCI IN PAIRS IN CHAINS Performed at [facility], [address], [place], [place] [address]    Culture PENDING  Incomplete   Report Status PENDING  Incomplete   Dr. Verdene Lennert Internal Medicine PGY-1  Pager: [phone] 02/08/2020, 12:50 PM

## 2020-02-08 NOTE — Progress Notes (Addendum)
PROGRESS NOTE    Brandon [name]  KGM:[id number] DOB: [birth year] DOA: 02/06/2020 PCP: Remi Haggard, FNP    Brief Narrative:  Brandon [name] is a 54 year old Caucasian male with past medical history notable for EtOH abuse, CVA, EtOH cirrhosis, seizure disorder, chronic pancreatitis, HTN, type 2 diabetes mellitus, HLD with generalized fatigue, and progressive back pain.  Patient states back pain onset roughly 3 months ago that has been progressing.  He also was complaining of some abdominal pain and thought it was an exacerbation of his chronic pancreatitis.  No other complaints or concerns at this time.  Patient continues to drink about a pint of liquor a day and continues with tobacco abuse.  Also reports noncompliance with his home medications, has been off his Plavix for the last 2 days.  In the ED, patient afebrile without leukocytosis.  CT abdomen/pelvis notable for highly suspicious discitis with T9/10 and T10/11 with fluid collection noted posterior right pleural space.  MR T-spine notable for discitis/osteomyelitis at T9/10 and T10/11 with ventral epidural abscess measuring up to 5 mm in thickness and causing moderate T9/10 and severe T10/11 spinal canal stenosis.  Large amount of paravertebral phlegmon at T9-T11 levels and empyema right posterior chest.  Neurosurgery was consulted for evaluation, TRH consulted for further evaluation and management with admission.   Assessment & Plan:   Active Problems:   Hypertension   GERD (gastroesophageal reflux disease)   Chronic Pancreatitis.   Hyperlipidemia   Smoking   CAD in native artery   Alcohol abuse   Tobacco abuse   Diabetes mellitus type 2 in nonobese (HCC)   History of CVA with residual deficit   Essential hypertension, benign   Diskitis   Alcohol withdrawal delirium ([place])   Epilepsia ([place])   Empyema lung ([place])   Right lung empyema Imaging notable for empyema right posterior chest measuring 4.7 cm. --IR consulted for  CT-guided drain placement today  Thoracic spine discitis, osteomyelitis with abscess Patient presenting with progressive low back pain over the last several months.  Denies IV drug use.  CT/MR findings concerning for osteomyelitis with abscess formation T9-T11 segments with significant paraspinal phlegmon causing spinal canal stenosis.  Patient is afebrile, white blood cell count 8.2, within normal limits.  TTE with EF 55-60%, LA mildly dilated, trivial MR. UDS positive for benzos and opiates. --Neurosurgery, infectious disease following, appreciate assistance ESR 80, CRP 6.7 --Blood cultures x2: No growth x 2 days --hemodynamically stable and no signs of sepsis, holding antibiotics until fluid sample obtained; plan to start vancomycin and ceftriaxone following procedure --If develops significant myelopathy neurosurgery would consider dorsal decompression T10/11; currently no indication at this time  Hypomagnesemia Magnesium 1.5, will replete with IV magnesium this morning. --Repeat magnesium level in the a.m.  EtOH dependence/abuse Patient continues to endorse liquor use 1 pint daily.  Patient reports withdrawal symptoms in the past.  Counseled on need for cessation. --CIWAA protocol with symptom triggered Ativan --Thiamine, folic acid, multivitamin  EtOH cirrhosis Apparently been followed by [facility] in the past.  Continues with EtOH abuse as above.  Counseled on need for cessation.  Chronic pancreatitis Pancreatic head hypodensity Etiology likely secondary to EtOH abuse.  Incidental findings on MRI L-spine with hypodensity pancreatic head/uncinate process.  Recommend outpatient MR dedicated pancreas study for further evaluation/surveillance  Essential hypertension On lisinopril 10 mg p.o. daily at home. --Hold home lisinopril given borderline blood pressures  Type 2 diabetes mellitus Hemoglobin A1c 5.6 02/2016. on Metformin 1000 mg p.o. twice  daily at home. --hemoglobin A1c:  Pending --Insulin sliding scale for coverage while inpatient  HLD: Continue simvastatin 20 mg p.o. daily  Seizure disorder: Continue Depakote ER 500 mg p.o. daily  Tobacco use disorder: Nicotine patch  GERD: Continue famotidine 20 mg p.o. twice daily   DVT prophylaxis: SCDs Code Status: Full code Family Communication: None present at bedside  Disposition Plan:  Status is: Inpatient  Remains inpatient appropriate because:Ongoing active pain requiring inpatient pain management, Ongoing diagnostic testing needed not appropriate for outpatient work up, Unsafe d/c plan and IV treatments appropriate due to intensity of illness or inability to take PO   Dispo: The patient is from: Home              Anticipated d/c is to: Home              Anticipated d/c date is: > 3 days              Patient currently is not medically stable to d/c.   Consultants:   Neurosurgery - Dr. Annette Stable  Infectious disease  Interventional radiology  Procedures:   TTE:  IMPRESSIONS  1. Left ventricular ejection fraction, by estimation, is 55 to 60%. The  left ventricle has normal function. The left ventricle has no regional  wall motion abnormalities. Left ventricular diastolic parameters were  normal.  2. Right ventricular systolic function is normal. The right ventricular  size is normal.  3. Left atrial size was mildly dilated.  4. The mitral valve is normal in structure. Trivial mitral valve  regurgitation. No evidence of mitral stenosis.  5. The aortic valve is grossly normal. Aortic valve regurgitation is not  visualized. Mild aortic valve sclerosis is present, with no evidence of  aortic valve stenosis.    Antimicrobials:   None   Subjective: Patient seen and examined bedside, resting comfortably.  Complains of fatigue, weakness.  Pending IR drain placement for empyema today.  Denies issues with urination/bowel movements, no saddle anesthesia or difficulty moving the lower  extremities.  Further denies headache, no visual changes, no chest pain, no palpitations, no shortness of breath, no fever/chills/night sweats, no nausea/vomiting/diarrhea, no abdominal pain.  No acute events overnight per nursing staff.  Objective: Vitals:   02/08/20 0419 02/08/20 0445 02/08/20 0736 02/08/20 0855  BP: 101/85  103/79 (!) 83/68  Pulse: 99  89 90  Resp: _0 Temp:  (!) 97.3 F (36.3 C) 98 F (36.7 C)   TempSrc:  Oral    SpO2: 100%  100% 100%  Weight:      Height:        Intake/Output Summary (Last 24 hours) at 02/08/2020 0931 Last data filed at 02/07/2020 1310 Gross per 24 hour  Intake 120 ml  Output 150 ml  Net -30 ml   Filed Weights   02/06/20 2013  Weight: 81.6 kg    Examination:  General exam: Appears calm and comfortable, NAD, disheveled in appearance Respiratory system: Clear to auscultation. Respiratory effort normal.  Oxygenating well on room air Cardiovascular system: S1 & S2 heard, RRR. No JVD, murmurs, rubs, gallops or clicks. No pedal edema. Gastrointestinal system: Abdomen is nondistended, soft and nontender. No organomegaly or masses felt. Normal bowel sounds heard. Central nervous system: Alert and oriented. No focal neurological deficits. Extremities: Symmetric 5 x 5 power. Skin: No rashes, lesions or ulcers Psychiatry: Judgement and insight appear poor. Mood & affect appropriate.     Data Reviewed: I have personally reviewed  following labs and imaging studies  CBC: Recent Labs  Lab 02/06/20 1206 02/07/20 0029 02/08/20 0606  WBC 9.5 8.2 9.2  NEUTROABS  --  6.2  --   HGB 11.4* 9.9* 10.6*  HCT 35.7* 30.7* 33.6*  MCV 97.3 95.6 98.8  PLT 499* 407* 944*   Basic Metabolic Panel: Recent Labs  Lab 02/06/20 1206 02/07/20 0029 02/08/20 0606  NA 137 134* 135  K 3.6 3.8 4.2  CL 100 100 101  CO2 _0 GLUCOSE 173* 234* 179*  BUN _1 CREATININE 0.49* 0.54* 0.58*  CALCIUM 8.7* 8.7* 8.7*  MG  --  1.3* 1.5*  PHOS  --   2.9  --    GFR: Estimated Creatinine Clearance: 114.5 mL/min (A) (by C-G formula based on SCr of 0.58 mg/dL (L)). Liver Function Tests: Recent Labs  Lab 02/06/20 1206 02/07/20 0029  AST 13* 13*  ALT 12 11  ALKPHOS 123 113  BILITOT 0.3 0.6  PROT 8.0 7.2  ALBUMIN 2.6* 2.4*   Recent Labs  Lab 02/06/20 1206  LIPASE 84*   Recent Labs  Lab 02/07/20 0029  AMMONIA 22   Coagulation Profile: Recent Labs  Lab 02/07/20 0029  INR 1.1   Cardiac Enzymes: No results for input(s): CKTOTAL, CKMB, CKMBINDEX, TROPONINI in the last 168 hours. BNP (last 3 results) No results for input(s): PROBNP in the last 8760 hours. HbA1C: No results for input(s): HGBA1C in the last 72 hours. CBG: Recent Labs  Lab 02/07/20 1622 02/07/20 1913 02/07/20 2331 02/08/20 0421 02/08/20 0730  GLUCAP 107* 209* 145* 231* 130*   Lipid Profile: No results for input(s): CHOL, HDL, LDLCALC, TRIG, CHOLHDL, LDLDIRECT in the last 72 hours. Thyroid Function Tests: Recent Labs    02/07/20 0029  TSH 1.398   Anemia Panel: No results for input(s): VITAMINB12, FOLATE, FERRITIN, TIBC, IRON, RETICCTPCT in the last 72 hours. Sepsis Labs: No results for input(s): PROCALCITON, LATICACIDVEN in the last 168 hours.  Recent Results (from the past 240 hour(s))  Urine culture     Status: None   Collection Time: 02/06/20  1:24 PM   Specimen: Urine, Random  Result Value Ref Range Status   Specimen Description URINE, RANDOM  Final   Special Requests NONE  Final   Culture   Final    NO GROWTH Performed at [facility], [address], [place], [place] [address]    Report Status 02/07/2020 FINAL  Final  Culture, blood (routine x 2)     Status: None (Preliminary result)   Collection Time: 02/06/20  5:05 PM   Specimen: BLOOD  Result Value Ref Range Status   Specimen Description BLOOD RIGHT ANTECUBITAL  Final   Special Requests   Final    BOTTLES DRAWN AEROBIC AND ANAEROBIC Blood Culture adequate volume    Culture   Final    NO GROWTH 2 DAYS Performed at [facility], [place] [address], [place], [place] [address]    Report Status PENDING  Incomplete  SARS Coronavirus 2 by RT PCR (hospital order, performed in [facility] hospital lab) Nasopharyngeal Nasopharyngeal Swab     Status: None   Collection Time: 02/06/20  6:13 PM   Specimen: Nasopharyngeal Swab  Result Value Ref Range Status   SARS Coronavirus 2 NEGATIVE NEGATIVE Final    Comment: (NOTE) SARS-CoV-2 target nucleic acids are NOT DETECTED. The SARS-CoV-2 RNA is generally detectable in upper and lower respiratory specimens during the acute phase of infection.  The lowest concentration of SARS-CoV-2 viral copies this assay can detect is 250 copies / mL. A negative result does not preclude SARS-CoV-2 infection and should not be used as the sole basis for treatment or other patient management decisions.  A negative result may occur with improper specimen collection / handling, submission of specimen other than nasopharyngeal swab, presence of viral mutation(s) within the areas targeted by this assay, and inadequate number of viral copies (<250 copies / mL). A negative result must be combined with clinical observations, patient history, and epidemiological information. Fact Sheet for Patients:   StrictlyIdeas.no Fact Sheet for Healthcare Providers: BankingDealers.co.za This test is not yet approved or cleared  by the [place] FDA and has been authorized for detection and/or diagnosis of SARS-CoV-2 by FDA under an Emergency Use Authorization (EUA).  This EUA will remain in effect (meaning this test can be used) for the duration of the COVID-19 declaration under Section 564(b)(1) of the Act, 21 U.S.C. section 360bbb-3(b)(1), unless the authorization is terminated or revoked sooner. Performed at [facility], [place] [address], [place], [place] [address]   Culture, blood (routine  x 2)     Status: None (Preliminary result)   Collection Time: 02/06/20  8:40 PM   Specimen: BLOOD  Result Value Ref Range Status   Specimen Description BLOOD RIGHT ANTECUBITAL  Final   Special Requests   Final    BOTTLES DRAWN AEROBIC AND ANAEROBIC Blood Culture adequate volume   Culture   Final    NO GROWTH 2 DAYS Performed at [facility], [place] [address], [place], [place] [address]    Report Status PENDING  Incomplete  MRSA PCR Screening     Status: None   Collection Time: 02/06/20 11:22 PM   Specimen: Nasal Mucosa; Nasopharyngeal  Result Value Ref Range Status   MRSA by PCR NEGATIVE NEGATIVE Final    Comment:        The GeneXpert MRSA Assay (FDA approved for NASAL specimens only), is one component of a comprehensive MRSA colonization surveillance program. It is not intended to diagnose MRSA infection nor to guide or monitor treatment for MRSA infections. Performed at [facility], [place] [address], [place], [place] [address]          Radiology Studies: MR THORACIC SPINE W WO CONTRAST  Result Date: 02/06/2020 CLINICAL DATA:  Back pain and suspected discitis EXAM: MRI THORACIC WITHOUT AND WITH CONTRAST TECHNIQUE: Multiplanar and multiecho pulse sequences of the thoracic spine were obtained without and with intravenous contrast. CONTRAST:  65m GADAVIST GADOBUTROL 1 MMOL/ML IV SOLN COMPARISON:  None. FINDINGS: MRI THORACIC SPINE FINDINGS Alignment:  Physiologic. Vertebrae: At T9, T10 and T11 there is hyperintense T2-weighted signal, loss of normal T1-weighted signal and diffuse contrast enhancement of the bone marrow. There is fluid within both disc spaces, worse at the lower level. Cord:  Normal signal and morphology. Paraspinal and other soft tissues: Empyema of the right posterior chest measures 4.7 cm. There is a large amount of paravertebral phlegmon at the T9-T11 levels. There is a ventral epidural abscess extending from T8-T11 that measures up to 5 mm in  thickness. Disc levels: T9-10 and T10-11 discitis-osteomyelitis and epidural collection results in moderate T9-10 stenosis and severe T10-11 spinal canal stenosis. There is severe bilateral neural foraminal stenosis at both levels. The other thoracic disc spaces are unremarkable. IMPRESSION: 1. Discitis-osteomyelitis at T9-10 and T10-11 with ventral epidural abscess measuring up to 5 mm in thickness and causing  moderate T9-10 and severe T10-11 spinal canal stenosis. There is severe bilateral neural foraminal stenosis at both levels. 2. Large amount of paravertebral phlegmon at the T9-T11 levels. 3. Empyema of the right posterior chest. Electronically Signed   By: Ulyses Jarred M.D.   On: 02/06/2020 19:42   CT Abdomen Pelvis W Contrast  Result Date: 02/06/2020 CLINICAL DATA:  Prior abnormal chest x-ray with possible free air. EXAM: CT ABDOMEN AND PELVIS WITH CONTRAST TECHNIQUE: Multidetector CT imaging of the abdomen and pelvis was performed using the standard protocol following bolus administration of intravenous contrast. CONTRAST:  161m OMNIPAQUE IOHEXOL 300 MG/ML  SOLN COMPARISON:  None. FINDINGS: Lower chest: Lung bases demonstrate a loculated peripherally enhancing fluid collection in the posterior pleural space on the right. This corresponds to the density seen on recent plain film examination. Some associated compressive atelectasis is seen as well. Hepatobiliary: Gallbladder is well distended with multiple dependent gallbladder stones. The liver is mildly fatty infiltrated. Intrahepatic biliary ductal dilatation is seen which may be related to a distal common bile duct stone. Pancreas: Pancreas demonstrates diffuse calcifications as well as mild pancreatic ductal dilatation. Hypodensity is noted in the region of the head and uncinate process best seen on image number 28 of series 3. This is new from the prior exam and may represent a focal pancreatic head mass. It measures approximately 16 mm. Scattered  calcifications are again seen similar to that noted on the prior exam consistent with prior chronic pancreatitis. Additionally previously seen left upper quadrant pseudocyst is noted but improved in size when compared with the prior exam. It lies predominately lateral to the spleen but courses along the anterior aspect of the spleen posterior to the stomach. Small pseudocyst is also noted adjacent to the head of the pancreas best seen on image number 30 of series 3. Spleen: Within normal limits. Adrenals/Urinary Tract: Adrenal glands are stable in appearance. Mild fullness of the left adrenal gland is seen. The kidneys demonstrate a normal enhancement pattern bilaterally. No obstructive changes are seen. Right renal cyst is noted. The bladder is well distended. Stomach/Bowel: Colon shows no obstructive or inflammatory changes. The abnormality seen on recent chest x-ray in the right upper quadrant represents interposition of colon between the diaphragm and anterior aspect of the liver. No free air is seen. No small bowel abnormality is noted. The appendix is unremarkable. Stomach is decompressed. Vascular/Lymphatic: Atherosclerotic calcifications of the abdominal aorta are noted. 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 This is likely related to the patient's previous pancreatitis episodes. 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 Reproductive: Prostate is unremarkable. Other: No free air or free fluid is noted. Musculoskeletal: There are changes at T9-T10 and T10-T11 with erosive changes of the endplates at both levels and perispinal inflammatory changes and enhancement consistent with discitis and paraspinal abscess. The loculated fluid collection on the right in the pleural space may be related to these changes as well. Degenerative changes of lumbar spine are seen. IMPRESSION:  Changes highly suspicious for discitis at T10-T11 and T9-T10 with changes of paraspinal abscesses. The fluid collection loculated in the posterior right pleural space may represent sequelae from these changes as well. MRI of the thoracic spine with contrast is recommended for further evaluation. Changes consistent with chronic pancreatitis and persistent but smaller pseudocysts particularly surrounding the spleen and stomach. Hypodensity is noted in the region of the head/uncinate process as  described above. This is somewhat suspicious for underlying mass given the new biliary ductal dilatation. MRI of the pancreas is recommended on an outpatient basis to allow for optimum imaging. Cavernous transformation of the portal vein with increase in the size of perigastric varices. No evidence of free air. The abnormality seen on prior chest x-ray is related to interposition of the colon between the liver in the diaphragm. Electronically Signed   By: Inez Catalina M.D.   On: 02/06/2020 16:06   DG Chest Portable 1 View  Result Date: 02/06/2020 CLINICAL DATA:  Cough EXAM: PORTABLE CHEST 1 VIEW COMPARISON:  03/08/2016 FINDINGS: Cardiac shadow is within normal limits. The lungs are well aerated bilaterally. Rounded soft tissue density is noted adjacent to the right cardiac border. This was not present on a prior CT examination from 2017. This may represent a rounded pneumonia or possible pericardial cyst lungs are otherwise clear. There is apparent free air under the right hemidiaphragm. IMPRESSION: No focal infiltrate is seen. Findings suspicious for free air under the right hemidiaphragm. Soft tissue density along the right heart border of uncertain significance. CT of the abdomen and pelvis is recommended for further evaluation and characterization of these findings. Electronically Signed   By: Inez Catalina M.D.   On: 02/06/2020 14:16   ECHOCARDIOGRAM COMPLETE  Result Date: 02/07/2020    ECHOCARDIOGRAM REPORT    Patient Name:   XZAVIER SWINGER Date of Exam: 02/07/2020 Medical Rec #:  [id number]        Height:       72.0 in Accession #:    [id number]       Weight:       180.0 lb Date of Birth:  [birth year]        BSA:          2.037 m Patient Age:    25 years         BP:           96/71 mmHg Patient Gender: M                HR:           94 bpm. Exam Location:  Inpatient Procedure: 2D Echo Indications:    diskitis  History:        Patient has prior history of Echocardiogram examinations, most                 recent 03/07/2016. CAD; Risk Factors:Hypertension, Dyslipidemia                 and Current Smoker. CVA. alcohol dependency.  Sonographer:    Jannett Celestine RDCS (AE) Referring Phys: [place]  Sonographer Comments: poor positioning (see comments) IMPRESSIONS  1. Left ventricular ejection fraction, by estimation, is 55 to 60%. The left ventricle has normal function. The left ventricle has no regional wall motion abnormalities. Left ventricular diastolic parameters were normal.  2. Right ventricular systolic function is normal. The right ventricular size is normal.  3. Left atrial size was mildly dilated.  4. The mitral valve is normal in structure. Trivial mitral valve regurgitation. No evidence of mitral stenosis.  5. The aortic valve is grossly normal. Aortic valve regurgitation is not visualized. Mild aortic valve sclerosis is present, with no evidence of aortic valve stenosis. Comparison(s): No significant change from prior study. FINDINGS  Left Ventricle: Left ventricular ejection fraction, by estimation, is 55 to 60%. The left ventricle has normal function. The left ventricle has no  regional wall motion abnormalities. The left ventricular internal cavity size was normal in size. There is  no left ventricular hypertrophy. Left ventricular diastolic parameters were normal. Right Ventricle: The right ventricular size is normal. No increase in right ventricular wall thickness. Right ventricular systolic  function is normal. Left Atrium: Left atrial size was mildly dilated. Right Atrium: Right atrial size was normal in size. Pericardium: There is no evidence of pericardial effusion. Mitral Valve: The mitral valve is normal in structure. Trivial mitral valve regurgitation. No evidence of mitral valve stenosis. Tricuspid Valve: The tricuspid valve is normal in structure. Tricuspid valve regurgitation is trivial. No evidence of tricuspid stenosis. Aortic Valve: The aortic valve is grossly normal. Aortic valve regurgitation is not visualized. Mild aortic valve sclerosis is present, with no evidence of aortic valve stenosis. There is mild calcification of the aortic valve. Pulmonic Valve: The pulmonic valve was not well visualized. Pulmonic valve regurgitation is not visualized. No evidence of pulmonic stenosis. Aorta: The aortic root is normal in size and structure. Venous: The inferior vena cava was not well visualized. IAS/Shunts: No atrial level shunt detected by color flow Doppler.  LEFT VENTRICLE PLAX 2D LVIDd:         4.40 cm  Diastology LVIDs:         3.10 cm  LV e' lateral:   11.90 cm/s LV PW:         1.10 cm  LV E/e' lateral: 6.6 LV IVS:        1.10 cm  LV e' medial:    7.94 cm/s LVOT diam:     2.20 cm  LV E/e' medial:  9.9 LV SV:         72 LV SV Index:   35 LVOT Area:     3.80 cm  RIGHT VENTRICLE RV S prime:     20.80 cm/s TAPSE (M-mode): 2.0 cm LEFT ATRIUM             Index LA diam:        3.40 cm 1.67 cm/m LA Vol (A2C):   44.3 ml 21.74 ml/m LA Vol (A4C):   54.4 ml 26.68 ml/m LA Biplane Vol: 55.6 ml 27.29 ml/m  AORTIC VALVE LVOT Vmax:   77.00 cm/s LVOT Vmean:  59.700 cm/s LVOT VTI:    0.189 m  AORTA Ao Root diam: 3.70 cm MITRAL VALVE MV Area (PHT): 5.54 cm    SHUNTS MV Decel Time: 137 msec    Systemic VTI:  0.19 m MV E velocity: 78.40 cm/s  Systemic Diam: 2.20 cm MV A velocity: 85.70 cm/s MV E/A ratio:  0.91 Buford Dresser MD Electronically signed by Buford Dresser MD Signature Date/Time:  02/07/2020/1:51:11 PM    Final    IR US CHEST  Result Date: 02/07/2020 CLINICAL DATA:  Thoracic spondylo discitis, loculated right pleural collection concerning for empyema. EXAM: CHEST ULTRASOUND COMPARISON:  02/06/2020 FINDINGS: Ultrasound performed of the chest. Unfortunately unable to visualize the small loculated posteromedial fluid collection within the chest by ultrasound. Thoracentesis not performed. IMPRESSION: Unable to visualize the small loculated posteromedial right pleural fluid collection. Electronically Signed   By: Jerilynn Mages.  Shick M.D.   On: 02/07/2020 13:48        Scheduled Meds: . divalproex  500 mg Oral Daily  . docusate sodium  100 mg Oral BID  . famotidine  20 mg Oral BID  . feeding supplement (GLUCERNA SHAKE)  237 mL Oral TID BM  . feeding supplement (PRO-STAT SUGAR FREE 64)  30 mL Oral BID  . fentaNYL      . folic acid  1 mg Oral Daily  . insulin aspart  0-9 Units Subcutaneous Q4H  . lidocaine      . LORazepam  0-4 mg Intravenous Q6H   Or  . LORazepam  0-4 mg Oral Q6H  . [START ON 02/09/2020] LORazepam  0-4 mg Intravenous Q12H   Or  . [START ON 02/09/2020] LORazepam  0-4 mg Oral Q12H  . midazolam      . multivitamin with minerals  1 tablet Oral Daily  . nicotine  21 mg Transdermal Daily  . simvastatin  20 mg Oral q1800  . sodium chloride flush  3 mL Intravenous Q12H  . thiamine  100 mg Oral Daily   Or  . thiamine  100 mg Intravenous Daily  . traZODone  150 mg Oral QHS   Continuous Infusions: . magnesium sulfate bolus IVPB       LOS: 2 days    Time spent: 38 minutes spent on chart review, discussion with nursing staff, consultants, updating family and interview/physical exam; more than 50% of that time was spent in counseling and/or coordination of care.    Monica Zahler J [place] DO [facility] Available via Epic secure chat 7am-7pm After these hours, please refer to coverage provider listed on [email or web address] 02/08/2020, 9:31 AM

## 2020-02-08 NOTE — TOC Initial Note (Signed)
Transition of Care (TOC) - Initial/Assessment Note  Donn Pierini RN,BSN Transitions of Care Unit 4NP (non trauma) - RN Case Manager [phone]   Patient Details  Name: Brandon [name] MRN: [id number] Date of Birth: [birth year]  Transition of Care [facility]) CM/SW Contact:    Darrold Span, RN Phone Number: 02/08/2020, 4:40 PM  Clinical Narrative:                 Received notice from bedside RN that sister at the bedside was requesting to speak to [facility] staff. Went to room to see pt and sister. Sister is asking for assistance in finding patient alternate housing- pt currently living with sister (almost 3 years) and per sister patient is not compliant with her "house rules" of no drinking and no smoking and states she has tried to assist him but feels like she can no longer "help" him and needs to find him somewhere like an ALF or "safe" place for him to be. States pt gets around $500 disability, and has had food stamps but lost them due to not turning in some paperwork so needs to re-apply. Explained to sister and patient that TOC does not assist with housing and ALF placement from the hospital- can provide with some information and resources. Per PT/OT evals- no recommendations for f/u. TOC to f/u with pt and sister and provide resources.  Per sister pt has PCP- cheryl lindley that he recently had f/u with.   Expected Discharge Plan: Home/Self Care Barriers to Discharge: Continued Medical Work up   Patient Goals and CMS Choice Patient states their goals for this hospitalization and ongoing recovery are:: return home      Expected Discharge Plan and Services Expected Discharge Plan: Home/Self Care In-house Referral: Clinical Social Work Discharge Planning Services: CM Consult   Living arrangements for the past 2 months: Single Family Home                                      Prior Living Arrangements/Services Living arrangements for the past 2 months: Single Family  Home Lives with:: Siblings(sister) Patient language and need for interpreter reviewed:: Yes Do you feel safe going back to the place where you live?: Yes      Need for Family Participation in Patient Care: Yes (Comment) Care giver support system in place?: Yes (comment)   Criminal Activity/Legal Involvement Pertinent to Current Situation/Hospitalization: No - Comment as needed  Activities of Daily Living      Permission Sought/Granted Permission sought to share information with : Family Supports    Share Information with NAME: Elenore Rota     Permission granted to share info w Relationship: sister     Emotional Assessment Appearance:: Appears stated age Attitude/Demeanor/Rapport: Engaged Affect (typically observed): Appropriate Orientation: : Oriented to Self, Oriented to Place, Oriented to  Time, Oriented to Situation Alcohol / Substance Use: Alcohol Use Psych Involvement: No (comment)  Admission diagnosis:  Epidural abscess [G06.2] Diskitis [M46.40] Dark urine [R82.998] Pain of upper abdomen [R10.10] Empyema (HCC) [J86.9] Malaise and fatigue [R53.81, R53.83] Patient Active Problem List   Diagnosis Date Noted  . Diskitis 02/06/2020  . Alcohol withdrawal delirium (HCC) 02/06/2020  . Epilepsia (HCC) 02/06/2020  . Empyema lung (HCC) 02/06/2020  . History of stroke 01/23/2017  . Dry eyes   . Sleep disturbance   . Essential hypertension, benign   . Upper GI bleed   .  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 Fatty liver   . Tobacco abuse   . Diabetes mellitus type 2 in nonobese (HCC)   . History of CVA with residual deficit   . Acute lower UTI   . Tachycardia   . Chronic alcoholic pancreatitis ([place])   . Hyponatremia 03/09/2016  . 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  . Acute blood loss anemia 03/09/2016  . Gastric varices   . Alcohol abuse   . Left-sided neglect   . Severe anemia   . UGIB (upper  gastrointestinal bleed)   . Pressure ulcer 03/06/2016  . Acute encephalopathy   . CVA (cerebral infarction) 03/05/2016  . Carotid stenosis 04/06/2015  . CAD in native artery 03/16/2015  . Unstable angina pectoris ([place]) 03/15/2015  . Neck pain 10/20/2013  . Insomnia 12/29/2012  . 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 Occlusion and stenosis of carotid artery with cerebral infarction 12/11/2012  . Unspecified cerebral artery occlusion with cerebral infarction 12/11/2012  . Fatigue 11/26/2012  . Hallux valgus 06/25/2012  . Preventative health care 06/25/2012  . Alcohol Dependence 06/18/2012  . Smoking 06/16/2012  . Bilateral extracranial carotid artery stenosis   . Coronary Artery Disease 06/13/2012  . Ischemic Stroke 06/13/2012  . Hyperlipidemia   . Hypertension 02/25/2011  . GERD (gastroesophageal reflux disease) 02/25/2011  . Chronic Pancreatitis. 02/25/2011  . Hepatic steatosis 02/25/2011   PCP:  Remi Haggard, FNP Pharmacy:   [id number] - [place], [address] [place] [address] Phone: [phone] Fax: [phone]     Social Determinants of Health (SDOH) Interventions    Readmission Risk Interventions No flowsheet data found.

## 2020-02-08 NOTE — Progress Notes (Signed)
Pharmacy Antibiotic Note  Brandon [name] is a 54 y.o. male admitted on 02/06/2020 with osteomyelitis.  Pharmacy has been consulted for Vancomycin dosing. CrCL >100 ml/min. MRSA PCR negative  Plan: Vanc 1000 mg IV q8hr Monitor renal function, C&S, and vanc levels as needed  Height: 6' (182.9 cm) Weight: 81.6 kg (180 lb) IBW/kg (Calculated) : 77.6  Temp (24hrs), Avg:97.8 F (36.6 C), Min:97.3 F (36.3 C), Max:98.3 F (36.8 C)  Recent Labs  Lab 02/06/20 1206 02/07/20 0029 02/08/20 0606  WBC 9.5 8.2 9.2  CREATININE 0.49* 0.54* 0.58*    Estimated Creatinine Clearance: 114.5 mL/min (A) (by C-G formula based on SCr of 0.58 mg/dL (L)).    Allergies  Allergen Reactions  . No Known Allergies     Antimicrobials this admission: Vanc 5/18 >>  Rocephin 5/18 >>   Thank you for allowing pharmacy to be a part of this patient's care.  Jeanella Cara, PharmD, [facility] Clinical Pharmacist Please see AMION for all Pharmacists' Contact Phone Numbers 02/08/2020, 10:53 AM

## 2020-02-08 NOTE — Progress Notes (Signed)
Occupational Therapy Treatment Patient Details Name: Brandon [name] MRN: [id number] DOB: [birth year] Today's Date: 02/08/2020    History of present illness Brandon [name] is a 54 year old Caucasian male with past medical history notable for EtOH abuse, CVA, EtOH cirrhosis, seizure disorder, chronic pancreatitis, HTN, type 2 diabetes mellitus, HLD with generalized fatigue, and progressive back pain.  Thought he had exacerbation of his chornic pancreatitis, MR T-spine notable for discitis/osteomyelitis at T9/10 and T10/11 with ventral epidural abscess measuring up to 5 mm in thickness and causing moderate T9/10 and severe T10/11 spinal canal stenosis.   OT comments  Pt making slow progress but most limited by pain with all transitional movements and in standing. Pt with significant weakness in legs L more than R. Pt unable to stand on L leg alone without assist.  Pt with very flat affect not overly participatory but does what is asked of him.  Feel if pain is under better control, he may do more.  Will continue to see.    Follow Up Recommendations  No OT follow up;Supervision - Intermittent    Equipment Recommendations  None recommended by OT    Recommendations for Other Services      Precautions / Restrictions Precautions Precautions: Fall Restrictions Weight Bearing Restrictions: No Other Position/Activity Restrictions: limited by pain       Mobility Bed Mobility               General bed mobility comments: pt in chair on arrival.  Transfers Overall transfer level: Needs assistance Equipment used: Rolling walker (2 wheeled) Transfers: Sit to/from [facility] Sit to Stand: Min assist Stand pivot transfers: Min assist       General transfer comment: Pt with pain with all transitional movements. Cues given for hand placement and safety.    Balance Overall balance assessment: Needs assistance Sitting-balance support: Feet supported;Bilateral upper  extremity supported Sitting balance-Leahy Scale: Good     Standing balance support: Bilateral upper extremity supported;During functional activity Standing balance-Leahy Scale: Fair Standing balance comment: Can statically stand when pulling up pants or grooming but the pain is so much that feel he is not safe standing without assistive device.                           ADL either performed or assessed with clinical judgement   ADL Overall ADL's : Needs assistance/impaired Eating/Feeding: Independent   Grooming: Wash/dry hands;Wash/dry face;Oral care;Brushing hair;Set up;Sitting Grooming Details (indicate cue type and reason): Pt able to tolerate sitting in chair for grooming.             Lower Body Dressing: Moderate assistance;Sit to/from stand;Cueing for back precautions;Cueing for compensatory techniques Lower Body Dressing Details (indicate cue type and reason): Pt unable to sit in figure 4 to donn socks and shoes. Pt with pain in back with all mobility.  Talked to pt about adaptive equipment but he is not intrested.   Toilet Transfer: Minimal assistance;RW;Stand-pivot;BSC   Toileting- Clothing Manipulation and Hygiene: Minimal assistance;Sit to/from stand;Cueing for back precautions;Cueing for compensatory techniques Toileting - Clothing Manipulation Details (indicate cue type and reason): cuing pt not to twist or bed. pt with long standing back pain but feel pt has most likely never followed any proper mechanics to help that pain.     Functional mobility during ADLs: Minimal assistance;Rolling walker General ADL Comments: Pt most limited in accessing his feet for dressing and bathing.     Vision  Vision Assessment?: No apparent visual deficits   Perception     Praxis      Cognition Arousal/Alertness: Awake/alert Behavior During Therapy: Flat affect Overall Cognitive Status: Impaired/Different from baseline Area of Impairment: Orientation                  Orientation Level: Time             General Comments: Continues to not know date.        Exercises     Shoulder Instructions       General Comments Pt in a lot of pain with all movements. Pt can stand and do some simple adls but not comfortable at all in standing.      Pertinent Vitals/ Pain       Pain Assessment: 0-10 Pain Score: 10-Worst pain ever Pain Location: back Pain Descriptors / Indicators: Guarding;Aching Pain Intervention(s): Limited activity within patient's tolerance;Monitored during session;Premedicated before session;Repositioned  Home Living                                          Prior Functioning/Environment              Frequency  Min 2X/week        Progress Toward Goals  OT Goals(current goals can now be found in the care plan section)  Progress towards OT goals: Progressing toward goals  Acute Rehab OT Goals Patient Stated Goal: none stated OT Goal Formulation: With patient Time For Goal Achievement: 02/21/20 Potential to Achieve Goals: Good ADL Goals Pt Will Perform Upper Body Dressing: with modified independence Pt Will Perform Lower Body Dressing: with modified independence Pt Will Transfer to Toilet: with modified independence  Plan Discharge plan remains appropriate    Co-evaluation                 AM-PAC OT "6 Clicks" Daily Activity     Outcome Measure   Help from another person eating meals?: None Help from another person taking care of personal grooming?: None Help from another person toileting, which includes using toliet, bedpan, or urinal?: A Little Help from another person bathing (including washing, rinsing, drying)?: A Little Help from another person to put on and taking off regular upper body clothing?: None Help from another person to put on and taking off regular lower body clothing?: A Lot 6 Click Score: 20    End of Session Equipment Utilized During Treatment: Rolling  walker  OT Visit Diagnosis: Unsteadiness on feet (R26.81);Muscle weakness (generalized) (M62.81)   Activity Tolerance Patient limited by fatigue   Patient Left in chair;with call bell/phone within reach   Nurse Communication Mobility status;Precautions        Time: 1212-1232 OT Time Calculation (min): 20 min  Charges: OT General Charges $OT Visit: 1 Visit OT Treatments $Self Care/Home Management : 8-22 mins   Hope Budds 02/08/2020, 12:44 PM

## 2020-02-08 NOTE — Plan of Care (Signed)
  Problem: Education: Goal: Knowledge of General Education information will improve Description: Including pain rating scale, medication(s)/side effects and non-pharmacologic comfort measures Outcome: Progressing  Explained plan of care at length to patient and sister, explained rationale for chest tube placement. Problem: Activity: Goal: Risk for activity intolerance will decrease Outcome: Progressing Patient up to chair and back to bed without sob or c/o dyspnea.   Problem: Pain Managment: Goal: General experience of comfort will improve Outcome: Progressing Patient states pain is well controlled with tylenol and vicodin.

## 2020-02-09 ENCOUNTER — Inpatient Hospital Stay: Payer: Self-pay

## 2020-02-09 DIAGNOSIS — M8628 Subacute osteomyelitis, other site: Secondary | ICD-10-CM

## 2020-02-09 LAB — CBC
HCT: 28.8 % — ABNORMAL LOW (ref 39.0–52.0)
Hemoglobin: 9.2 g/dL — ABNORMAL LOW (ref 13.0–17.0)
MCH: 31.3 pg (ref 26.0–34.0)
MCHC: 31.9 g/dL (ref 30.0–36.0)
MCV: 98 fL (ref 80.0–100.0)
Platelets: 343 10*3/uL (ref 150–400)
RBC: 2.94 MIL/uL — ABNORMAL LOW (ref 4.22–5.81)
RDW: 17.4 % — ABNORMAL HIGH (ref 11.5–15.5)
WBC: 6.8 10*3/uL (ref 4.0–10.5)
nRBC: 0 % (ref 0.0–0.2)

## 2020-02-09 LAB — GLUCOSE, CAPILLARY
Glucose-Capillary: 176 mg/dL — ABNORMAL HIGH (ref 70–99)
Glucose-Capillary: 132 mg/dL — ABNORMAL HIGH (ref 70–99)
Glucose-Capillary: 290 mg/dL — ABNORMAL HIGH (ref 70–99)
Glucose-Capillary: 275 mg/dL — ABNORMAL HIGH (ref 70–99)
Glucose-Capillary: 121 mg/dL — ABNORMAL HIGH (ref 70–99)

## 2020-02-09 LAB — BASIC METABOLIC PANEL
Anion gap: 7 (ref 5–15)
BUN: 13 mg/dL (ref 6–20)
CO2: 26 mmol/L (ref 22–32)
Calcium: 8.2 mg/dL — ABNORMAL LOW (ref 8.9–10.3)
Chloride: 100 mmol/L (ref 98–111)
Creatinine, Ser: 0.54 mg/dL — ABNORMAL LOW (ref 0.61–1.24)
GFR calc Af Amer: >60 mL/min (ref >60)
GFR calc non Af Amer: >60 mL/min (ref >60)
Glucose, Bld: 172 mg/dL — ABNORMAL HIGH (ref 70–99)
Potassium: 4.4 mmol/L (ref 3.5–5.1)
Sodium: 133 mmol/L — ABNORMAL LOW (ref 135–145)

## 2020-02-09 LAB — HEMOGLOBIN A1C
Hgb A1c MFr Bld: 7.1 % — ABNORMAL HIGH (ref 4.8–5.6)
Mean Plasma Glucose: 157 mg/dL

## 2020-02-09 LAB — MAGNESIUM: Magnesium: 2.1 mg/dL (ref 1.7–2.4)

## 2020-02-09 MED ORDER — INSULIN ASPART 100 UNIT/ML ~~LOC~~ SOLN
0.0000 [IU] | Freq: Three times a day (TID) | SUBCUTANEOUS | Status: DC
  Administered 2020-02-09: 8 [IU] via SUBCUTANEOUS
  Administered 2020-02-10: 3 [IU] via SUBCUTANEOUS
  Administered 2020-02-10: 2 [IU] via SUBCUTANEOUS
  Administered 2020-02-11: 5 [IU] via SUBCUTANEOUS
  Administered 2020-02-11: 8 [IU] via SUBCUTANEOUS
  Administered 2020-02-11: 2 [IU] via SUBCUTANEOUS
  Administered 2020-02-12: 5 [IU] via SUBCUTANEOUS
  Administered 2020-02-13 (×2): 2 [IU] via SUBCUTANEOUS
  Administered 2020-02-13 – 2020-02-14 (×2): 3 [IU] via SUBCUTANEOUS
  Administered 2020-02-14: 15 [IU] via SUBCUTANEOUS
  Administered 2020-02-15 – 2020-02-18 (×5): 3 [IU] via SUBCUTANEOUS
  Administered 2020-02-19: 2 [IU] via SUBCUTANEOUS
  Administered 2020-02-19: 3 [IU] via SUBCUTANEOUS

## 2020-02-09 MED ORDER — INSULIN ASPART 100 UNIT/ML ~~LOC~~ SOLN
0.0000 [IU] | Freq: Every day | SUBCUTANEOUS | Status: DC
  Administered 2020-02-11: 2 [IU] via SUBCUTANEOUS
  Administered 2020-02-12: 3 [IU] via SUBCUTANEOUS
  Administered 2020-02-16: 4 [IU] via SUBCUTANEOUS
  Administered 2020-02-16: 3 [IU] via SUBCUTANEOUS

## 2020-02-09 MED ORDER — POLYETHYLENE GLYCOL 3350 17 G PO PACK
17.0000 g | PACK | Freq: Two times a day (BID) | ORAL | Status: AC
  Administered 2020-02-09 – 2020-02-10 (×3): 17 g via ORAL
  Filled 2020-02-09 (×4): qty 1

## 2020-02-09 MED ORDER — SODIUM CHLORIDE 0.9% FLUSH: 10.0000 mL | INTRAVENOUS | Status: DC | PRN

## 2020-02-09 MED ORDER — INSULIN ASPART 100 UNIT/ML ~~LOC~~ SOLN
4.0000 [IU] | Freq: Three times a day (TID) | SUBCUTANEOUS | Status: DC
  Administered 2020-02-09 – 2020-02-18 (×22): 4 [IU] via SUBCUTANEOUS

## 2020-02-09 MED ORDER — MORPHINE SULFATE (PF) 2 MG/ML IV SOLN
2.0000 mg | INTRAVENOUS | Status: DC | PRN
  Administered 2020-02-09 – 2020-02-11 (×3): 2 mg via INTRAVENOUS
  Filled 2020-02-09 (×3): qty 1

## 2020-02-09 MED ORDER — INSULIN DETEMIR 100 UNIT/ML ~~LOC~~ SOLN
10.0000 [IU] | Freq: Two times a day (BID) | SUBCUTANEOUS | Status: DC
  Administered 2020-02-09 – 2020-02-10 (×4): 10 [IU] via SUBCUTANEOUS
  Filled 2020-02-09 (×7): qty 0.1

## 2020-02-09 MED ORDER — CHLORHEXIDINE GLUCONATE CLOTH 2 % EX PADS
6.0000 | MEDICATED_PAD | Freq: Every day | CUTANEOUS | Status: DC
  Administered 2020-02-09 – 2020-02-19 (×11): 6 via TOPICAL

## 2020-02-09 NOTE — TOC Progression Note (Addendum)
Transition of Care [facility]) - Progression Note    Patient Details  Name: DEIONDRE HARROWER MRN: [id number] Date of Birth: 12-18-1965  Transition of [facility] CM/SW Contact  Eduard Roux, [place] Phone Number: 02/09/2020, 6:30 PM  Clinical Narrative:     CSW unable to compete assessment- patient getting PICC placed.  CSW spoke with the patient's sister,[name]. CSW introduced self and explained role. [name] expressed she works full-time and only at home with brother 3 days out of the week. She explained she is not able to provide the level of care needed for patient at this time. She states she is hopeful that the patient agrees with placement and believes patient would benefit from short term rehab. CSW explained the SNF process, possibility of limited SNF choices and possible barriers.Including the patient will need to agree. [name] states she understands.   CSW will continue to follow and assist with discharge planning.  Antony Blackbird, MSW, LCSWA Clinical Social Worker   Expected Discharge Plan: Home/Self Care Barriers to Discharge: Continued Medical Work up  Expected Discharge Plan and Services Expected Discharge Plan: Home/Self Care In-house Referral: Clinical Social Work Discharge Planning Services: CM Consult   Living arrangements for the past 2 months: Single Family Home                                       Social Determinants of Health (SDOH) Interventions    Readmission Risk Interventions No flowsheet data found.

## 2020-02-09 NOTE — Progress Notes (Addendum)
Referring Physician(s): [place], Alvira PhilipsEric J  Supervising Physician: Gilmer MorWagner, Jaime  Patient Status:  [facility]  Chief Complaint: "Painful drain"  Subjective:  Right empyema s/p right chest tube placement in IR 02/08/2020 by Dr. [name]. Patient awake and alert sitting in bed. Complains of tenderness near right chest tube insertion site, as expected. Right chest tube site c/d/i. Sating 98% on RA.   Allergies: No known allergies  Medications: Prior to Admission medications   Medication Sig Start Date End Date Taking? Authorizing Provider  busPIRone (BUSPAR) 10 MG tablet Take 10 mg by mouth 2 (two) times daily. 12/28/19  Yes [provider]  clopidogrel (PLAVIX) 75 MG tablet Take 75 mg by mouth daily.    Yes [provider]  cyclobenzaprine (FLEXERIL) 10 MG tablet Take 10 mg by mouth every 12 (twelve) hours. 01/08/17  Yes [provider]  famotidine (PEPCID) 20 MG tablet Take 20 mg by mouth 2 (two) times daily.  01/08/17  Yes [provider]  lisinopril (PRINIVIL,ZESTRIL) 10 MG tablet Take 10 mg by mouth daily.  01/12/17  Yes [provider]  metFORMIN (GLUCOPHAGE) 1000 MG tablet Take 1 tablet (1,000 mg total) by mouth 2 (two) times daily with a meal. 03/29/16  Yes Angiulli, Mcarthur Rossettianiel J, PA-C  metoprolol tartrate (LOPRESSOR) 100 MG tablet Take 100 mg by mouth daily.   Yes [provider]  simvastatin (ZOCOR) 20 MG tablet Take 20 mg by mouth daily at 6 PM.    Yes [provider]     Vital Signs: BP 106/84   Pulse 93   Temp 98 F (36.7 C) (Oral)   Resp 13   Ht 6' (1.829 m)   Wt 180 lb (81.6 kg)   SpO2 99%   BMI 24.41 kg/m   Physical Exam Vitals and nursing note reviewed.  Constitutional:      General: He is not in acute distress.    Appearance: Normal appearance.  Pulmonary:     Effort: Pulmonary effort is normal. No respiratory distress.     Comments: Right chest tube site with mild tenderness, no erythema,  drainage, or active bleeding; approximately 30 cc purulent drainage in pleure-vac; tube to suction with (-) air leak. Skin:    General: Skin is warm and dry.  Neurological:     Mental Status: He is alert and oriented to person, place, and time.     Imaging: MR THORACIC SPINE W WO CONTRAST  Result Date: 02/06/2020 CLINICAL DATA:  Back pain and suspected discitis EXAM: MRI THORACIC WITHOUT AND WITH CONTRAST TECHNIQUE: Multiplanar and multiecho pulse sequences of the thoracic spine were obtained without and with intravenous contrast. CONTRAST:  8mL GADAVIST GADOBUTROL 1 MMOL/ML IV SOLN COMPARISON:  None. FINDINGS: MRI THORACIC SPINE FINDINGS Alignment:  Physiologic. Vertebrae: At T9, T10 and T11 there is hyperintense T2-weighted signal, loss of normal T1-weighted signal and diffuse contrast enhancement of the bone marrow. There is fluid within both disc spaces, worse at the lower level. Cord:  Normal signal and morphology. Paraspinal and other soft tissues: Empyema of the right posterior chest measures 4.7 cm. There is a large amount of paravertebral phlegmon at the T9-T11 levels. There is a ventral epidural abscess extending from T8-T11 that measures up to 5 mm in thickness. Disc levels: T9-10 and T10-11 discitis-osteomyelitis and epidural collection results in moderate T9-10 stenosis and severe T10-11 spinal canal stenosis. There is severe bilateral neural foraminal stenosis at both levels. The other thoracic disc spaces are unremarkable.  IMPRESSION: 1. 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 There is severe bilateral neural foraminal stenosis at both levels. 2. Large amount of paravertebral phlegmon at the T9-T11 levels. 3. Empyema of the right posterior chest. Electronically Signed   By: Ulyses Jarred M.D.   On: 02/06/2020 19:42   CT Abdomen Pelvis W Contrast  Result  Date: 02/06/2020 CLINICAL DATA:  Prior abnormal chest x-ray with possible free air. EXAM: CT ABDOMEN AND PELVIS WITH CONTRAST TECHNIQUE: Multidetector CT imaging of the abdomen and pelvis was performed using the standard protocol following bolus administration of intravenous contrast. CONTRAST:  15mL OMNIPAQUE IOHEXOL 300 MG/ML  SOLN COMPARISON:  None. FINDINGS: Lower chest: Lung bases demonstrate a loculated peripherally enhancing fluid collection in the posterior pleural space on the right. This corresponds to the density seen on recent plain film examination. Some associated compressive atelectasis is seen as well. Hepatobiliary: Gallbladder is well distended with multiple dependent gallbladder stones. The liver is mildly fatty infiltrated. Intrahepatic biliary ductal dilatation is seen which may be related to a distal common bile duct stone. Pancreas: Pancreas demonstrates diffuse calcifications as well as mild pancreatic ductal dilatation. Hypodensity is noted in the region of the head and uncinate process best seen on image number 28 of series 3. This is new from the prior exam and may represent a focal pancreatic head mass. It measures approximately 16 mm. Scattered calcifications are again seen similar to that noted on the prior exam consistent with prior chronic pancreatitis. Additionally previously seen left upper quadrant pseudocyst is noted but improved in size when compared with the prior exam. It lies predominately lateral to the spleen but courses along the anterior aspect of the spleen posterior to the stomach. Small pseudocyst is also noted adjacent to the head of the pancreas best seen on image number 30 of series 3. Spleen: Within normal limits. Adrenals/Urinary Tract: Adrenal glands are stable in appearance. Mild fullness of the left adrenal gland is seen. The kidneys demonstrate a normal enhancement pattern bilaterally. No obstructive changes are seen. Right renal cyst is noted. The bladder is  well distended. Stomach/Bowel: Colon shows no obstructive or inflammatory changes. The abnormality seen on recent chest x-ray in the right upper quadrant represents interposition of colon between the diaphragm and anterior aspect of the liver. No free air is seen. No small bowel abnormality is noted. The appendix is unremarkable. Stomach is decompressed. Vascular/Lymphatic: Atherosclerotic calcifications of the abdominal aorta are noted. 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 This is likely related to the patient's previous pancreatitis episodes. 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 Reproductive: Prostate is unremarkable. Other: No free air or free fluid is noted. Musculoskeletal: There are changes at T9-T10 and T10-T11 with erosive changes of the endplates at both levels and perispinal inflammatory changes and enhancement consistent with discitis and paraspinal abscess. The loculated fluid collection on the right in the pleural space may be related to these changes as well. Degenerative changes of lumbar spine are seen. IMPRESSION: Changes highly suspicious for discitis at T10-T11 and T9-T10 with changes of paraspinal abscesses. The fluid collection loculated in the posterior right pleural space may represent sequelae from these changes as well. MRI of the thoracic spine with contrast is recommended for further evaluation. Changes consistent with chronic pancreatitis and persistent  but smaller pseudocysts particularly surrounding the spleen and stomach. Hypodensity is noted in the region of the head/uncinate process as described above. This is somewhat suspicious for underlying mass given the new biliary ductal dilatation. MRI of the pancreas is recommended on an outpatient basis to allow for optimum imaging. 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 No evidence of free air. The abnormality seen on prior chest x-ray is related to interposition of the colon between the liver in the diaphragm. Electronically Signed   By: Alcide Clever M.D.   On: 02/06/2020 16:06   DG Chest Portable 1 View  Result Date: 02/06/2020 CLINICAL DATA:  Cough EXAM: PORTABLE CHEST 1 VIEW COMPARISON:  03/08/2016 FINDINGS: Cardiac shadow is within normal limits. The lungs are well aerated bilaterally. Rounded soft tissue density is noted adjacent to the right cardiac border. This was not present on a prior CT examination from 2017. This may represent a rounded pneumonia or possible pericardial cyst lungs are otherwise clear. There is apparent free air under the right hemidiaphragm. IMPRESSION: No focal infiltrate is seen. Findings suspicious for free air under the right hemidiaphragm. Soft tissue density along the right heart border of uncertain significance. CT of the abdomen and pelvis is recommended for further evaluation and characterization of these findings. Electronically Signed   By: Alcide Clever M.D.   On: 02/06/2020 14:16   ECHOCARDIOGRAM COMPLETE  Result Date: 02/07/2020    ECHOCARDIOGRAM REPORT   Patient Name:   Brandon [name] Date of Exam: 02/07/2020 Medical Rec #:  [id number]        Height:       72.0 in Accession #:    [id number]       Weight:       180.0 lb Date of Birth:  [birth year]        BSA:          2.037 m Patient Age:    53 years         BP:           96/71 mmHg Patient Gender: M                HR:           94 bpm. Exam Location:  Inpatient Procedure: 2D Echo Indications:    diskitis  History:        Patient has prior history of Echocardiogram examinations, most                 recent 03/07/2016. CAD; Risk Factors:Hypertension, Dyslipidemia                 and Current Smoker. CVA. alcohol dependency.  Sonographer:    Celene Skeen RDCS (AE) Referring Phys: [id number] ANASTASSIA DOUTOVA  Sonographer Comments: poor positioning (see comments)  IMPRESSIONS  1. Left ventricular ejection fraction, by estimation, is 55 to 60%. The left ventricle has normal function. The left ventricle has no regional wall motion abnormalities. Left ventricular diastolic parameters were normal.  2. Right ventricular systolic function is normal. The right ventricular size is normal.  3. Left atrial size was mildly dilated.  4. The mitral valve is normal in structure. Trivial mitral valve regurgitation. No evidence of mitral stenosis.  5. The aortic valve is grossly normal. Aortic valve regurgitation is not visualized. Mild aortic valve sclerosis is present, with no evidence of aortic valve stenosis. Comparison(s): No significant change from prior study. FINDINGS  Left Ventricle:  Left ventricular ejection fraction, by estimation, is 55 to 60%. The left ventricle has normal function. The left ventricle has no regional wall motion abnormalities. The left ventricular internal cavity size was normal in size. There is  no left ventricular hypertrophy. Left ventricular diastolic parameters were normal. Right Ventricle: The right ventricular size is normal. No increase in right ventricular wall thickness. Right ventricular systolic function is normal. Left Atrium: Left atrial size was mildly dilated. Right Atrium: Right atrial size was normal in size. Pericardium: There is no evidence of pericardial effusion. Mitral Valve: The mitral valve is normal in structure. Trivial mitral valve regurgitation. No evidence of mitral valve stenosis. Tricuspid Valve: The tricuspid valve is normal in structure. Tricuspid valve regurgitation is trivial. No evidence of tricuspid stenosis. Aortic Valve: The aortic valve is grossly normal. Aortic valve regurgitation is not visualized. Mild aortic valve sclerosis is present, with no evidence of aortic valve stenosis. There is mild calcification of the aortic valve. Pulmonic Valve: The pulmonic valve was not well visualized. Pulmonic valve regurgitation is  not visualized. No evidence of pulmonic stenosis. Aorta: The aortic root is normal in size and structure. Venous: The inferior vena cava was not well visualized. IAS/Shunts: No atrial level shunt detected by color flow Doppler.  LEFT VENTRICLE PLAX 2D LVIDd:         4.40 cm  Diastology LVIDs:         3.10 cm  LV e' lateral:   11.90 cm/s LV PW:         1.10 cm  LV E/e' lateral: 6.6 LV IVS:        1.10 cm  LV e' medial:    7.94 cm/s LVOT diam:     2.20 cm  LV E/e' medial:  9.9 LV SV:         72 LV SV Index:   35 LVOT Area:     3.80 cm  RIGHT VENTRICLE RV S prime:     20.80 cm/s TAPSE (M-mode): 2.0 cm LEFT ATRIUM             Index LA diam:        3.40 cm 1.67 cm/m LA Vol (A2C):   44.3 ml 21.74 ml/m LA Vol (A4C):   54.4 ml 26.68 ml/m LA Biplane Vol: 55.6 ml 27.29 ml/m  AORTIC VALVE LVOT Vmax:   77.00 cm/s LVOT Vmean:  59.700 cm/s LVOT VTI:    0.189 m  AORTA Ao Root diam: 3.70 cm MITRAL VALVE MV Area (PHT): 5.54 cm    SHUNTS MV Decel Time: 137 msec    Systemic VTI:  0.19 m MV E velocity: 78.40 cm/s  Systemic Diam: 2.20 cm MV A velocity: 85.70 cm/s MV E/A ratio:  0.91 Jodelle Red MD Electronically signed by Jodelle Red MD Signature Date/Time: 02/07/2020/1:51:11 PM    Final    IR US CHEST  Result Date: 02/07/2020 CLINICAL DATA:  Thoracic spondylo discitis, loculated right pleural collection concerning for empyema. EXAM: CHEST ULTRASOUND COMPARISON:  02/06/2020 FINDINGS: Ultrasound performed of the chest. Unfortunately unable to visualize the small loculated posteromedial fluid collection within the chest by ultrasound. Thoracentesis not performed. IMPRESSION: Unable to visualize the small loculated posteromedial right pleural fluid collection. Electronically Signed   By: Judie Petit.  Shick M.D.   On: 02/07/2020 13:48   CT IMAGE GUIDED FLUID DRAIN BY CATHETER  Result Date: 02/08/2020 INDICATION: 54 year old male with a history right posterior chest empyema and associated osteomyelitis discitis. He  has been referred for  chest tube placement. EXAM: CT-GUIDED CHEST TUBE PLACEMENT, RIGHT EMPYEMA MEDICATIONS: The patient is currently admitted to the hospital and receiving intravenous antibiotics. The antibiotics were administered within an appropriate time frame prior to the initiation of the procedure. ANESTHESIA/SEDATION: Fentanyl 50 mcg IV; Versed 0 mg IV Moderate Sedation Time:  None The patient was continuously monitored during the procedure by the interventional radiology nurse under my direct supervision. COMPLICATIONS: None PROCEDURE: Informed written consent was obtained from the patient after a thorough discussion of the procedural risks, benefits and alternatives. All questions were addressed. Maximal Sterile Barrier Technique was utilized including caps, mask, sterile gowns, sterile gloves, sterile drape, hand hygiene and skin antiseptic. A timeout was performed prior to the initiation of the procedure. Patient was positioned left decubitus position on the CT gantry table. Scout CT was acquired for planning purposes. After initial CT was performed the patient is prepped and draped in the usual sterile fashion. 1% lidocaine was used for local anesthesia. Yueh catheter was placed into the empyema with aspirate performed for culture. Modified Seldinger technique was then used to place a 12 [place] drain. Drain was attached to evacuation chamber. Drain was sutured in position. Final image was stored. Patient tolerated the procedure well and remained hemodynamically stable throughout. No complications were encountered and no significant blood loss. IMPRESSION: Status post CT-guided drain placement into right-sided empyema. Frank pus was aspirated and a culture was sent. Signed, Yvone Neu. Reyne Dumas, RPVI [facility] Electronically Signed   By: Gilmer Mor D.O.   On: 02/08/2020 11:19    Labs:  CBC: Recent Labs    02/06/20 1206 02/07/20 0029  02/08/20 0606 02/09/20 0330  WBC 9.5 8.2 9.2 6.8  HGB 11.4* 9.9* 10.6* 9.2*  HCT 35.7* 30.7* 33.6* 28.8*  PLT 499* 407* 407* 343    COAGS: Recent Labs    02/07/20 0029  INR 1.1    BMP: Recent Labs    02/06/20 1206 02/07/20 0029 02/08/20 0606 02/09/20 0330  NA 137 134* 135 133*  K 3.6 3.8 4.2 4.4  CL 100 100 101 100  CO2 23 25 25 26   GLUCOSE 173* 234* 179* 172*  BUN 10 12 13 13   CALCIUM 8.7* 8.7* 8.7* 8.2*  CREATININE 0.49* 0.54* 0.58* 0.54*  GFRNONAA >60 >60 >60 >60  GFRAA >60 >60 >60 >60    LIVER FUNCTION TESTS: Recent Labs    02/06/20 1206 02/07/20 0029  BILITOT 0.3 0.6  AST 13* 13*  ALT 12 11  ALKPHOS 123 113  PROT 8.0 7.2  ALBUMIN 2.6* 2.4*    Assessment and Plan:  Right empyema s/p right chest tube placement in IR 02/08/2020 by Dr. 02/09/20. Right chest tube stable with approximately 30 cc purulent drainage in pleure-vac. Continue current drain management- tube to remain to suction at this time. Discussed case with Dr. 02/10/2020 who recommends TCTS involvement regarding management of empyema. Further plans per TRH/ID/neurosurgery- appreciate and agree with management. IR to follow.   Electronically Signed: Loreta Ave, PA-C 02/09/2020, 11:25 AM   I spent a total of 25 Minutes at the the patient's bedside AND on the patient's hospital floor or unit, greater than 50% of which was counseling/coordinating care for right empyema s/p right chest tube placement.

## 2020-02-09 NOTE — Progress Notes (Addendum)
[name] for Infectious Disease    Date of Admission:  02/06/2020   Total days of antibiotics 2           ID: Brandon [name] is a 54 y.o. male with strep pneumoniae empyema, discitis, ventral epidural abscess Active Problems:   Hypertension   GERD (gastroesophageal reflux disease)   Chronic Pancreatitis.   Hyperlipidemia   Smoking   CAD in native artery   Alcohol abuse   Tobacco abuse   Diabetes mellitus type 2 in nonobese (HCC)   History of CVA with residual deficit   Essential hypertension, benign   Diskitis   Alcohol withdrawal delirium ([place])   Epilepsia ([place])   Empyema lung (HCC)    Subjective: Fatigue still having some back pain. Little output in chest tube  Medications:  . divalproex  500 mg Oral Daily  . docusate sodium  100 mg Oral BID  . famotidine  20 mg Oral BID  . feeding supplement (GLUCERNA SHAKE)  237 mL Oral TID BM  . feeding supplement (PRO-STAT SUGAR FREE 64)  30 mL Oral BID  . folic acid  1 mg Oral Daily  . insulin aspart  0-15 Units Subcutaneous TID WC  . insulin aspart  0-5 Units Subcutaneous QHS  . insulin aspart  4 Units Subcutaneous TID WC  . insulin detemir  10 Units Subcutaneous BID  . multivitamin with minerals  1 tablet Oral Daily  . nicotine  21 mg Transdermal Daily  . polyethylene glycol  17 g Oral BID  . simvastatin  20 mg Oral q1800  . sodium chloride flush  3 mL Intravenous Q12H  . thiamine  100 mg Oral Daily   Or  . thiamine  100 mg Intravenous Daily  . traZODone  150 mg Oral QHS    Objective: Vital signs in last 24 hours: Temp:  [97.7 F (36.5 C)-98.3 F (36.8 C)] 97.8 F (36.6 C) (05/19 1141) Pulse Rate:  [77-97] 96 (05/19 1141) Resp:  [10-20] 12 (05/19 1141) BP: (91-144)/(52-97) 108/90 (05/19 1141) SpO2:  [96 %-100 %] 97 % (05/19 1141) Physical Exam  Constitutional: He is oriented to person, place, and time. He appears chronically ill, and mal-nourished. No distress.  HENT:  Mouth/Throat: Oropharynx is  clear and moist. No oropharyngeal exudate.  Cardiovascular: Normal rate, regular rhythm and normal heart sounds. Exam reveals no gallop and no friction rub.  No murmur heard.  Pulmonary/Chest: Effort normal and breath sounds normal. No respiratory distress. He has no wheezes. Left sided chest tube Abdominal: Soft. Bowel sounds are normal. He exhibits no distension. There is no tenderness.  Lymphadenopathy:  He has no cervical adenopathy.  Neurological: He is alert and oriented to person, place, and time.  Skin: Skin is warm and dry. No rash noted. No erythema.  Psychiatric: He has a normal mood and affect. His behavior is normal.    Lab Results Recent Labs    02/08/20 0606 02/09/20 0330  WBC 9.2 6.8  HGB 10.6* 9.2*  HCT 33.6* 28.8*  NA 135 133*  K 4.2 4.4  CL 101 100  CO2 25 26  BUN 13 13  CREATININE 0.58* 0.54*   Liver Panel Recent Labs    02/07/20 0029  PROT 7.2  ALBUMIN 2.4*  AST 13*  ALT 11  ALKPHOS 113  BILITOT 0.6   Sedimentation Rate Recent Labs    02/08/20 0606  ESRSEDRATE 80*   C-Reactive Protein Recent Labs    02/06/20 2039 02/08/20  0606  CRP 5.0* 6.7*    Microbiology: 5/18 empyema cx = strep pneumonaie 5/16 blood cx = no growth to date Studies/Results: IR US CHEST  Result Date: 02/07/2020 CLINICAL DATA:  Thoracic spondylo discitis, loculated right pleural collection concerning for empyema. EXAM: CHEST ULTRASOUND COMPARISON:  02/06/2020 FINDINGS: Ultrasound performed of the chest. Unfortunately unable to visualize the small loculated posteromedial fluid collection within the chest by ultrasound. Thoracentesis not performed. IMPRESSION: Unable to visualize the small loculated posteromedial right pleural fluid collection. Electronically Signed   By: Jerilynn Mages.  Shick M.D.   On: 02/07/2020 13:48   CT IMAGE GUIDED FLUID DRAIN BY CATHETER  Result Date: 02/08/2020 INDICATION: 54 year old male with a history right posterior chest empyema and associated  osteomyelitis discitis. He has been referred for chest tube placement. EXAM: CT-GUIDED CHEST TUBE PLACEMENT, RIGHT EMPYEMA MEDICATIONS: The patient is currently admitted to the hospital and receiving intravenous antibiotics. The antibiotics were administered within an appropriate time frame prior to the initiation of the procedure. ANESTHESIA/SEDATION: Fentanyl 50 mcg IV; Versed 0 mg IV Moderate Sedation Time:  None The patient was continuously monitored during the procedure by the interventional radiology nurse under my direct supervision. COMPLICATIONS: None PROCEDURE: Informed written consent was obtained from the patient after a thorough discussion of the procedural risks, benefits and alternatives. All questions were addressed. Maximal Sterile Barrier Technique was utilized including caps, mask, sterile gowns, sterile gloves, sterile drape, hand hygiene and skin antiseptic. A timeout was performed prior to the initiation of the procedure. Patient was positioned left decubitus position on the CT gantry table. Scout CT was acquired for planning purposes. After initial CT was performed the patient is prepped and draped in the usual sterile fashion. 1% lidocaine was used for local anesthesia. Yueh catheter was placed into the empyema with aspirate performed for culture. Modified Seldinger technique was then used to place a 12 [place] drain. Drain was attached to evacuation chamber. Drain was sutured in position. Final image was stored. Patient tolerated the procedure well and remained hemodynamically stable throughout. No complications were encountered and no significant blood loss. IMPRESSION: Status post CT-guided drain placement into right-sided empyema. Frank pus was aspirated and a culture was sent. Signed, Dulcy Fanny. Dellia Nims, RPVI [facility] Inter[facility] Radiology [facility]ists [facility] Electronically Signed   By: Corrie Mckusick D.O.   On: 02/08/2020 11:19     Assessment/Plan: Strep  pneumonaie empyema c/b discitis and ventral epidural abscess = recommend to treat for 6 wk with ceftriaxone 2gm IV daily then can switch to oral agents. Can place picc line since has not had bacteremia. Will place opat order and arrange for follow up. Not sure what his social support symptoms and may need SNF for abtx.  Empyema = low output, defer to primary care for management. Recommend repeat chest CXR to see if improving.  Diagnosis: Strep pneumonaie discitis/empyema  Culture Result: strep pneumonaie  Allergies  Allergen Reactions  . No Known Allergies     OPAT Orders Discharge antibiotics to be given via PICC line Discharge antibiotics: Per pharmacy protocol ceftriaxone 2gm IV daily  Duration: 6 wk End Date: Jun 29th  [place] Per Protocol:  Home health RN for IV administration and teaching; PICC line care and labs.    Labs weekly while on IV antibiotics: _x_ CBC with differential _x_ BMP __ CMP _x_ CRP _x_ ESR __ Vancomycin trough __ CK  __x Please pull PIC at completion of IV antibiotics   Fax weekly labs to [phone]  [phone]  Clinic Follow Up Appt: In 4-6 wk  @ [facility] for Infectious Diseases Cell: [phone] Pager: [phone]  02/09/2020, 1:04 PM

## 2020-02-09 NOTE — Progress Notes (Signed)
Peripherally Inserted Central Catheter Placement  The IV Nurse has discussed with the patient and/or persons authorized to consent for the patient, the purpose of this procedure and the potential benefits and risks involved with this procedure.  The benefits include less needle sticks, lab draws from the catheter, and the patient may be discharged home with the catheter. Risks include, but not limited to, infection, bleeding, blood clot (thrombus formation), and puncture of an artery; nerve damage and irregular heartbeat and possibility to perform a PICC exchange if needed/ordered by physician.  Alternatives to this procedure were also discussed.  Bard Power PICC patient education guide, fact sheet on infection prevention and patient information card has been provided to patient /or left at bedside.    PICC Placement Documentation  PICC Single Lumen 02/09/20 PICC Right Basilic 42 cm 0 cm (Active)  Indication for Insertion or Continuance of Line Home intravenous therapies (PICC only) 02/09/20 1610  Exposed Catheter (cm) 0 cm 02/09/20 1610  Site Assessment Clean;Dry;Intact 02/09/20 1610  Line Status Flushed;Saline locked;Blood return noted 02/09/20 1610  Dressing Type Transparent;Securing device 02/09/20 1610  Dressing Status Clean;Dry;Intact 02/09/20 1610  Dressing Intervention New dressing 02/09/20 1610  Dressing Change Due 02/16/20 02/09/20 1610       Annett Fabian 02/09/2020, 4:12 PM

## 2020-02-09 NOTE — Progress Notes (Signed)
Overall stable. Pain in back; no LE pain, sensory loss or weakness.  Empyema drained yest cx pending.  No new recs-- cont current abx and mobilization

## 2020-02-09 NOTE — Progress Notes (Signed)
Physical Therapy Treatment Patient Details Name: Brandon [name] MRN: [id number] DOB: Jun 18, 1966 Today's Date: 02/09/2020    History of Present Illness Brandon [name] is a 54 year old Caucasian male with past medical history notable for EtOH abuse, CVA, EtOH cirrhosis, seizure disorder, chronic pancreatitis, HTN, type 2 diabetes mellitus, HLD with generalized fatigue, and progressive back pain.  Thought he had exacerbation of his chornic pancreatitis, MR T-spine notable for discitis/osteomyelitis at T9/10 and T10/11 with ventral epidural abscess measuring up to 5 mm in thickness and causing moderate T9/10 and severe T10/11 spinal canal stenosis. PICC line placed 5/19.    PT Comments    Pt limited by pain during session from chest tube and also citing significant weakness, pt reporting in his back. PT noting weakness in L side most significantly with L foot drag and difficulty maintaining LUE on RW during attempted ambulation. Pt also requiring bed to be raised in order to stand without significant assistance from PT. Pt tachy throughout session with limited activity tolerance. Pt demonstrating significant regression from PT evaluation, PT now recommending SNF as pt requires physical assistance for all OOB mobility to prevent a fall. Pt will benefit from continued acute PT POC to reduce falls risk and improve activity tolerance.  Follow Up Recommendations  SNF;Supervision/Assistance - 24 hour     Equipment Recommendations  Wheelchair (measurements PT);Wheelchair cushion (measurements PT)    Recommendations for Other Services       Precautions / Restrictions Precautions Precautions: Fall Restrictions Weight Bearing Restrictions: No Other Position/Activity Restrictions: limited by pain    Mobility  Bed Mobility Overal bed mobility: Needs Assistance Bed Mobility: Supine to Sit     Supine to sit: Supervision        Transfers Overall transfer level: Needs assistance Equipment  used: Rolling walker (2 wheeled) Transfers: Stand Pivot Transfers;Sit to/from Stand Sit to Stand: Min assist Stand pivot transfers: Min assist          Ambulation/Gait Ambulation/Gait assistance: Mod assist Gait Distance (Feet): 10 Feet(10' forward and backward) Assistive device: Rolling walker (2 wheeled) Gait Pattern/deviations: Step-to pattern;Decreased step length - left Gait velocity: reduced Gait velocity interpretation: <1.31 ft/sec, indicative of household ambulator General Gait Details: pt with reduced step length and some foot drag of LLE during forward and backward walking. Pt also with difficulty maintaining LUE on RW to control device. PT providing physical support to maintain pt balance multiple times during short distance   Stairs             Wheelchair Mobility    Modified Rankin (Stroke Patients Only)       Balance Overall balance assessment: Needs assistance Sitting-balance support: Single extremity supported;Feet supported Sitting balance-Leahy Scale: Good Sitting balance - Comments: supervision at the edge of bed   Standing balance support: Bilateral upper extremity supported Standing balance-Leahy Scale: Poor Standing balance comment: minA with BUE support of RW                            Cognition Arousal/Alertness: Awake/alert Behavior During Therapy: Flat affect Overall Cognitive Status: Within Functional Limits for tasks assessed                                        Exercises      General Comments General comments (skin integrity, edema, etc.): pt tachy up to 141 during  mobility, HR ranging from 100s to 141. HR does decrease when resting in recliner back to 110s. Pt limited by significant pain, unable to mobilize much during session.      Pertinent Vitals/Pain Pain Assessment: Faces Faces Pain Scale: Hurts whole lot Pain Location: back at chest tube site Pain Descriptors / Indicators: Grimacing Pain  Intervention(s): Monitored during session    Home Living                      Prior Function            PT Goals (current goals can now be found in the care plan section) Acute Rehab PT Goals Patient Stated Goal: none stated Progress towards PT goals: Not progressing toward goals - comment(pt limited by pain and weakness)    Frequency    Min 3X/week      PT Plan Discharge plan needs to be updated    Co-evaluation              AM-PAC PT "6 Clicks" Mobility   Outcome Measure  Help needed turning from your back to your side while in a flat bed without using bedrails?: None Help needed moving from lying on your back to sitting on the side of a flat bed without using bedrails?: None Help needed moving to and from a bed to a chair (including a wheelchair)?: A Little Help needed standing up from a chair using your arms (e.g., wheelchair or bedside chair)?: A Little Help needed to walk in hospital room?: A Lot Help needed climbing 3-5 steps with a railing? : Total 6 Click Score: 17    End of Session   Activity Tolerance: Patient limited by pain Patient left: in chair;with call bell/phone within reach Nurse Communication: Mobility status PT Visit Diagnosis: Other abnormalities of gait and mobility (R26.89);Muscle weakness (generalized) (M62.81)     Time: 5638-7564 PT Time Calculation (min) (ACUTE ONLY): 28 min  Charges:  $Gait Training: 8-22 mins $Therapeutic Activity: 8-22 mins                     Zenaida Niece, PT, DPT Acute Rehabilitation Pager: [phone]    Zenaida Niece 02/09/2020, 5:38 PM

## 2020-02-09 NOTE — Progress Notes (Signed)
Inpatient Diabetes Program Recommendations  AACE/ADA: New Consensus Statement on Inpatient Glycemic Control (2015)  Target Ranges:  Prepandial:   less than 140 mg/dL      Peak postprandial:   less than 180 mg/dL (1-2 hours)      Critically ill patients:  140 - 180 mg/dL   Lab Results  Component Value Date   GLUCAP 290 (H) 02/09/2020   HGBA1C 7.1 (H) 02/08/2020    Review of Glycemic Control Results for Brandon [name], Brandon [name] (MRN [id number]) as of 02/09/2020 11:42  Ref. Range 02/08/2020 23:42 02/09/2020 03:56 02/09/2020 08:07 02/09/2020 11:39  Glucose-Capillary Latest Ref Range: 70 - 99 mg/dL 782 (H) 956 (H) 213 (H) 290 (H)   Diabetes history: Type 2 DM Outpatient Diabetes medications: Metformin 1000 mg BID Current orders for Inpatient glycemic control: Novolog 0-9 units Q4H  Inpatient Diabetes Program Recommendations:    Given diet order, consider:  -Novolog 0-9 units TID & HS -If post prandials continue to exceed 180 mg/dL, consider adding Novolog 3 units TID (assuming patient is consuming >50% of meal).  Thanks, Lujean Rave, MSN, RNC-OB Diabetes Coordinator [phone] (8a-5p)

## 2020-02-09 NOTE — Progress Notes (Signed)
[facility] PROGRESS NOTE    Progress Note  Brandon [name]  YBO:[id number] DOB: Mar 06, 1966 DOA: 02/06/2020 PCP: Remi Haggard, FNP     Brief Narrative:   Brandon [name] is an 54 y.o. male past medical history of alcohol abuse, CVA alcoholic cirrhosis, seizure disorder, chronic pancreatitis, uncontrolled diabetes mellitus presents to the hospital with generalized weakness and progressive back pain he relates that he has been having this back pain for about 3 months progressively getting worst CT scan of the abdomen and pelvis was notable for discitis on T9-T10 and T10 and 1011 with fluid collection noted posteriorly in the right pleural space, MRI of the T-spine confirmed discitis and osteomyelitis with an epidural abscess measuring about 5 mm causing moderate T9-T10 and severe T9 T11 spinal stenosis, large amounts of paravertebral phlegmon at T9 T11 level and empyema of the right posterior chest neurosurgery was consulted for evaluation  Assessment/Plan:   Right lung empyema: IR was consulted and CT-guided drain placement with thrombolysis performed. Culture from the empyema is growing Strep pneumo.  Sensitivities pending  Thoracic spine discitis, osteomyelitis with abscess: MRI finding concerning for osteomyelitis with abscess at T9-T11 causing spinal stenosis. TTE with an EF of 55% no vegetation UDS positive for benzos and opiates. Infectious disease and neurosurgery were consulted and appreciate assistance, ESR is elevated as well as CRP. Neurosurgery recommended no drain or surgical intervention to abscess as he has no focal symptoms. ID and I have agreed to continue IV empiric antibiotics IV vancomycin and Rocephin Blood cultures have shown no growth till date. Patient remains hemodynamically stable and no signs of sepsis. If he develops myelopathy neurosurgery would consider a dorsal decompression of abscess no indication of that time. Add IV morphine for pain  control.  Hypomagnesemia: Repleted orally now resolved.  Alcohol dependence/abuse: He endorses withdrawal symptoms in the past. We are monitoring him with the CIWA protocol, he was started empirically on thiamine and folate.  Alcoholic cirrhosis: He continues to drink see above for further details.  Chronic pancreatitis: Incidental finding on MRI of the lumbar spine that showed a hypodensity in the pancreas head, will need MRI of the pancreas as an outpatient for surveillance.  Essential hypertension: Blood pressure soft holding antihypertensive medication.  Diabetes mellitus type 2: With an A1c of 7.1, Metformin has been discontinued. Blood glucose is trending up start him on long-acting insulin plus sliding scale.  Hyperlipidemia: Excellent continue statins.  Seizure disorder: Continue Depakote.  Nicotine use: Continue nicotine patch.   DVT prophylaxis: lovenox Family Communication:none Status is: Inpatient  Remains inpatient appropriate because:Hemodynamically unstable   Dispo: The patient is from: Home              Anticipated d/c is to: Home              Anticipated d/c date is: > 3 days              Patient currently is not medically stable to d/c.  Code Status:     Code Status Orders  (From admission, onward)         Start     Ordered   02/06/20 2130  Full code  Continuous     02/06/20 2129        Code Status History    Date Active Date Inactive Code Status Order ID Comments User Context   03/12/2016 1817 03/29/2016 1546 Full Code [id number]  Elizabeth Sauer Inpatient   03/05/2016 2146 03/12/2016  1817 Full Code [id number]  Tawni Millers, MD ED   04/06/2015 1030 04/07/2015 1325 Full Code [id number]  Algernon Huxley, MD Inpatient   03/16/2015 1433 03/17/2015 1321 Full Code [id number]  Dionisio David, MD Inpatient   03/15/2015 1947 03/16/2015 1433 Full Code [id number]  Dionisio David, MD Inpatient   Advance Care Planning Activity    Advance  Directive Documentation     Most Recent Value  Type of Advance Directive  Healthcare Power of Attorney  Pre-existing out of facility DNR order (yellow form or pink MOST form)  --  "MOST" Form in Place?  --        IV Access:    Peripheral IV   Procedures and diagnostic studies:   IR US CHEST  Result Date: 02/07/2020 CLINICAL DATA:  Thoracic spondylo discitis, loculated right pleural collection concerning for empyema. EXAM: CHEST ULTRASOUND COMPARISON:  02/06/2020 FINDINGS: Ultrasound performed of the chest. Unfortunately unable to visualize the small loculated posteromedial fluid collection within the chest by ultrasound. Thoracentesis not performed. IMPRESSION: Unable to visualize the small loculated posteromedial right pleural fluid collection. Electronically Signed   By: Jerilynn Mages.  Shick M.D.   On: 02/07/2020 13:48   CT IMAGE GUIDED FLUID DRAIN BY CATHETER  Result Date: 02/08/2020 INDICATION: 54 year old male with a history right posterior chest empyema and associated osteomyelitis discitis. He has been referred for chest tube placement. EXAM: CT-GUIDED CHEST TUBE PLACEMENT, RIGHT EMPYEMA MEDICATIONS: The patient is currently admitted to the hospital and receiving intravenous antibiotics. The antibiotics were administered within an appropriate time frame prior to the initiation of the procedure. ANESTHESIA/SEDATION: Fentanyl 50 mcg IV; Versed 0 mg IV Moderate Sedation Time:  None The patient was continuously monitored during the procedure by the interventional radiology nurse under my direct supervision. COMPLICATIONS: None PROCEDURE: Informed written consent was obtained from the patient after a thorough discussion of the procedural risks, benefits and alternatives. All questions were addressed. Maximal Sterile Barrier Technique was utilized including caps, mask, sterile gowns, sterile gloves, sterile drape, hand hygiene and skin antiseptic. A timeout was performed prior to the initiation of the  procedure. Patient was positioned left decubitus position on the CT gantry table. Scout CT was acquired for planning purposes. After initial CT was performed the patient is prepped and draped in the usual sterile fashion. 1% lidocaine was used for local anesthesia. Yueh catheter was placed into the empyema with aspirate performed for culture. Modified Seldinger technique was then used to place a 12 [place] drain. Drain was attached to evacuation chamber. Drain was sutured in position. Final image was stored. Patient tolerated the procedure well and remained hemodynamically stable throughout. No complications were encountered and no significant blood loss. IMPRESSION: Status post CT-guided drain placement into right-sided empyema. Frank pus was aspirated and a culture was sent. Signed, Dulcy Fanny. Dellia Nims, RPVI [facility] Electronically Signed   By: Corrie Mckusick D.O.   On: 02/08/2020 11:19     Medical Consultants:    None.  Anti-Infectives:   vanc and rocephin  Subjective:    MACDONALD RIGOR continues to have back pain, he relates he is tolerating his diet, his last bowel movement was 4 days ago.  Objective:    Vitals:   02/09/20 0700 02/09/20 0810 02/09/20 0924 02/09/20 1141  BP: 137/87 100/75 106/84 108/90  Pulse: 77 90 93 96  Resp: '11 15 13 12  '$ Temp:  98 F (36.7 C)  97.8 F (36.6 C)  TempSrc:  Oral  Oral  SpO2: 97% 97% 99% 97%  Weight:      Height:       SpO2: 97 % O2 Flow Rate (L/min): 2 L/min   Intake/Output Summary (Last 24 hours) at 02/09/2020 1232 Last data filed at 02/09/2020 0815 Gross per 24 hour  Intake 2369.38 ml  Output 830 ml  Net 1539.38 ml   Filed Weights   02/06/20 2013  Weight: 81.6 kg    Exam: General exam: In no acute distress. Respiratory system: Good air movement and clear to auscultation. Cardiovascular system: S1 & S2 heard, RRR. No JVD. Gastrointestinal system: Abdomen is  nondistended, soft and nontender.  Central nervous system: Alert and oriented. No focal neurological deficits. Extremities: No pedal edema. Skin: No rashes, lesions or ulcers  Data Reviewed:    Labs: Basic Metabolic Panel: Recent Labs  Lab 02/06/20 1206 02/06/20 1206 02/07/20 0029 02/07/20 0029 02/08/20 0606 02/09/20 0330  NA 137  --  134*  --  135 133*  K 3.6   < > 3.8   < > 4.2 4.4  CL 100  --  100  --  101 100  CO2 23  --  25  --  25 26  GLUCOSE 173*  --  234*  --  179* 172*  BUN 10  --  12  --  13 13  CREATININE 0.49*  --  0.54*  --  0.58* 0.54*  CALCIUM 8.7*  --  8.7*  --  8.7* 8.2*  MG  --   --  1.3*  --  1.5* 2.1  PHOS  --   --  2.9  --   --   --    < > = values in this interval not displayed.   GFR Estimated Creatinine Clearance: 114.5 mL/min (A) (by C-G formula based on SCr of 0.54 mg/dL (L)). Liver Function Tests: Recent Labs  Lab 02/06/20 1206 02/07/20 0029  AST 13* 13*  ALT 12 11  ALKPHOS 123 113  BILITOT 0.3 0.6  PROT 8.0 7.2  ALBUMIN 2.6* 2.4*   Recent Labs  Lab 02/06/20 1206  LIPASE 84*   Recent Labs  Lab 02/07/20 0029  AMMONIA 22   Coagulation profile Recent Labs  Lab 02/07/20 0029  INR 1.1   COVID-19 Labs  Recent Labs    02/06/20 2039 02/08/20 0606  CRP 5.0* 6.7*    Lab Results  Component Value Date   SARSCOV2NAA NEGATIVE 02/06/2020    CBC: Recent Labs  Lab 02/06/20 1206 02/07/20 0029 02/08/20 0606 02/09/20 0330  WBC 9.5 8.2 9.2 6.8  NEUTROABS  --  6.2  --   --   HGB 11.4* 9.9* 10.6* 9.2*  HCT 35.7* 30.7* 33.6* 28.8*  MCV 97.3 95.6 98.8 98.0  PLT 499* 407* 407* 343   Cardiac Enzymes: No results for input(s): CKTOTAL, CKMB, CKMBINDEX, TROPONINI in the last 168 hours. BNP (last 3 results) No results for input(s): PROBNP in the last 8760 hours. CBG: Recent Labs  Lab 02/08/20 2033 02/08/20 2342 02/09/20 0356 02/09/20 0807 02/09/20 1139  GLUCAP 134* 177* 176* 132* 290*   D-Dimer: No results for  input(s): DDIMER in the last 72 hours. Hgb A1c: Recent Labs    02/08/20 1328  HGBA1C 7.1*   Lipid Profile: No results for input(s): CHOL, HDL, LDLCALC, TRIG, CHOLHDL, LDLDIRECT in the last 72 hours. Thyroid function studies: Recent Labs    02/07/20 0029  TSH 1.398   Anemia work up: No results  for input(s): VITAMINB12, FOLATE, FERRITIN, TIBC, IRON, RETICCTPCT in the last 72 hours. Sepsis Labs: Recent Labs  Lab 02/06/20 1206 02/07/20 0029 02/08/20 0606 02/09/20 0330  WBC 9.5 8.2 9.2 6.8   Microbiology Recent Results (from the past 240 hour(s))  Urine culture     Status: None   Collection Time: 02/06/20  1:24 PM   Specimen: Urine, Random  Result Value Ref Range Status   Specimen Description URINE, RANDOM  Final   Special Requests NONE  Final   Culture   Final    NO GROWTH Performed at [facility], [address], [place], [place] [address]    Report Status 02/07/2020 FINAL  Final  Culture, blood (routine x 2)     Status: None (Preliminary result)   Collection Time: 02/06/20  5:05 PM   Specimen: BLOOD  Result Value Ref Range Status   Specimen Description BLOOD RIGHT ANTECUBITAL  Final   Special Requests   Final    BOTTLES DRAWN AEROBIC AND ANAEROBIC Blood Culture adequate volume   Culture   Final    NO GROWTH 3 DAYS Performed at [facility], [place] [address], [place], [place] [address]    Report Status PENDING  Incomplete  SARS Coronavirus 2 by RT PCR (hospital order, performed in [facility] hospital lab) Nasopharyngeal Nasopharyngeal Swab     Status: None   Collection Time: 02/06/20  6:13 PM   Specimen: Nasopharyngeal Swab  Result Value Ref Range Status   SARS Coronavirus 2 NEGATIVE NEGATIVE Final    Comment: (NOTE) SARS-CoV-2 target nucleic acids are NOT DETECTED. The SARS-CoV-2 RNA is generally detectable in upper and lower respiratory specimens during the acute phase of infection. The lowest concentration of SARS-CoV-2 viral copies this assay  can detect is 250 copies / mL. A negative result does not preclude SARS-CoV-2 infection and should not be used as the sole basis for treatment or other patient management decisions.  A negative result may occur with improper specimen collection / handling, submission of specimen other than nasopharyngeal swab, presence of viral mutation(s) within the areas targeted by this assay, and inadequate number of viral copies (<250 copies / mL). A negative result must be combined with clinical observations, patient history, and epidemiological information. Fact Sheet for Patients:   StrictlyIdeas.no Fact Sheet for Healthcare Providers: BankingDealers.co.za This test is not yet approved or cleared  by the [place] FDA and has been authorized for detection and/or diagnosis of SARS-CoV-2 by FDA under an Emergency Use Authorization (EUA).  This EUA will remain in effect (meaning this test can be used) for the duration of the COVID-19 declaration under Section 564(b)(1) of the Act, 21 U.S.C. section 360bbb-3(b)(1), unless the authorization is terminated or revoked sooner. Performed at [facility], [place] [address], [place], [place] [address]   Culture, blood (routine x 2)     Status: None (Preliminary result)   Collection Time: 02/06/20  8:40 PM   Specimen: BLOOD  Result Value Ref Range Status   Specimen Description BLOOD RIGHT ANTECUBITAL  Final   Special Requests   Final    BOTTLES DRAWN AEROBIC AND ANAEROBIC Blood Culture adequate volume   Culture   Final    NO GROWTH 3 DAYS Performed at [facility], [place] [address], [place], [place] [address]    Report Status PENDING  Incomplete  MRSA PCR Screening     Status: None   Collection Time: 02/06/20 11:22 PM   Specimen:  Nasal Mucosa; Nasopharyngeal  Result Value Ref Range Status   MRSA by PCR NEGATIVE NEGATIVE Final    Comment:        The GeneXpert MRSA Assay (FDA approved for  NASAL specimens only), is one component of a comprehensive MRSA colonization surveillance program. It is not intended to diagnose MRSA infection nor to guide or monitor treatment for MRSA infections. Performed at [facility], [place] [address], [place], [place] [address]   Aerobic/Anaerobic Culture (surgical/deep wound)     Status: None (Preliminary result)   Collection Time: 02/08/20  9:56 AM   Specimen: Abscess  Result Value Ref Range Status   Specimen Description ABSCESS RIGHT PLEURAL  Final   Special Requests NONE  Final   Gram Stain   Final    ABUNDANT WBC PRESENT, PREDOMINANTLY PMN ABUNDANT GRAM POSITIVE COCCI IN PAIRS IN CHAINS    Culture   Final    FEW STREPTOCOCCUS PNEUMONIAE SUSCEPTIBILITIES TO FOLLOW Performed at [facility], [address], [place], [place] [address]    Report Status PENDING  Incomplete     Medications:   . divalproex  500 mg Oral Daily  . docusate sodium  100 mg Oral BID  . famotidine  20 mg Oral BID  . feeding supplement (GLUCERNA SHAKE)  237 mL Oral TID BM  . feeding supplement (PRO-STAT SUGAR FREE 64)  30 mL Oral BID  . folic acid  1 mg Oral Daily  . insulin aspart  0-9 Units Subcutaneous Q4H  . LORazepam  0-4 mg Intravenous Q12H   Or  . LORazepam  0-4 mg Oral Q12H  . multivitamin with minerals  1 tablet Oral Daily  . nicotine  21 mg Transdermal Daily  . simvastatin  20 mg Oral q1800  . sodium chloride flush  3 mL Intravenous Q12H  . thiamine  100 mg Oral Daily   Or  . thiamine  100 mg Intravenous Daily  . traZODone  150 mg Oral QHS   Continuous Infusions: . cefTRIAXone (ROCEPHIN)  IV 200 mL/hr at 02/09/20 1225      LOS: 3 days   Charlynne Cousins  [facility]  02/09/2020, 12:32 PM

## 2020-02-09 NOTE — Progress Notes (Signed)
PHARMACY CONSULT NOTE FOR:  OUTPATIENT  PARENTERAL ANTIBIOTIC THERAPY (OPAT)  Indication: discitis, ventral epidural abscess Regimen: Rocephin 2 grams IV q24hr End date: 03/21/20  IV antibiotic discharge orders are pended. To discharging provider:  please sign these orders via discharge navigator,  Select New Orders & click on the button choice - Manage This Unsigned Work.     Thank you for allowing pharmacy to be a part of this patient's care.  Tera Mater 02/09/2020, 1:34 PM

## 2020-02-10 DIAGNOSIS — J869 Pyothorax without fistula: Secondary | ICD-10-CM

## 2020-02-10 LAB — GLUCOSE, CAPILLARY
Glucose-Capillary: 106 mg/dL — ABNORMAL HIGH (ref 70–99)
Glucose-Capillary: 189 mg/dL — ABNORMAL HIGH (ref 70–99)
Glucose-Capillary: 137 mg/dL — ABNORMAL HIGH (ref 70–99)
Glucose-Capillary: 164 mg/dL — ABNORMAL HIGH (ref 70–99)

## 2020-02-10 MED ORDER — METOPROLOL TARTRATE 50 MG PO TABS
50.0000 mg | ORAL_TABLET | Freq: Two times a day (BID) | ORAL | Status: DC
  Administered 2020-02-10 – 2020-02-19 (×18): 50 mg via ORAL
  Filled 2020-02-10 (×19): qty 1

## 2020-02-10 MED ORDER — BUSPIRONE HCL 5 MG PO TABS
10.0000 mg | ORAL_TABLET | Freq: Two times a day (BID) | ORAL | Status: DC
  Administered 2020-02-10 – 2020-02-19 (×19): 10 mg via ORAL
  Filled 2020-02-10 (×19): qty 2

## 2020-02-10 MED ORDER — METOPROLOL TARTRATE 50 MG PO TABS: 100.0000 mg | ORAL_TABLET | Freq: Every day | ORAL | Status: DC

## 2020-02-10 NOTE — Consult Note (Addendum)
[address]       [place] [id number]             [phone]        [name] Medical Record #[id number] Date of Birth: Jun 22, 1966  Referring: Dr. Marinda [name] Feliz [name] Primary Care: Brandon GangLindley, Brandon P, FNP Primary Cardiologist:No primary care provider on file.  Chief Complaint:    Chief Complaint  Patient presents with  . Abdominal Pain  . Weakness    History of Present Illness:      [name] is a 54 yo white male with known history of alcohol abuse, CVA, Alcoholic cirrhosis, seizure disorder, chronic pancreatitis, and uncontrolled diabetes mellitis.  He presented to the ED with a few week complaint of generalized weakness and progressive back pain.  This has been progressively getting worse over the past several months.  CT scan of the abdomen and pelvis was obtained and showed discitis on T9-T10.  There was also a fluid collection present on T10 and T11 and a posterior fluid collection in the posterior right pleural space.  MRI of the spine was obtained and confirmed discitis and osteomyelitis with an epidural abscess measuring 4 mm causing moderate T9-T10 and severe T9-T11 spinal stenosis. There was also a large amount of paravertebral phlgemom at T9-T11.  He was admitted for antibiotics and further care.  Neurosurgery consult was obtained who did not feel surgical intervention would be indicated at this time.  They recommended IR I/D of fluid collection.  This was performed and cultures were obtained.  This grew Strep pneumoniae.  ID consult was performed and they recommended 6 weeks of IV antibiotics.  IR was also consulted to place a drain in the right pleural space.  This has not drained much and we have been consulted for possible VATS and decortication.  Currently the patient is stable.  He does complain of some persistent back discomfort.  He continued to drink prior to admission with oral intake of at least 1 pint per day.  He overall is  not very active stating he was limited to his back pain.  He is a current smoker.   Current Activity/ Functional Status: Patient is not independent with mobility/ambulation, transfers, ADL's, IADL's.   Zubrod Score: At the time of surgery this patient's most appropriate activity status/level should be described as: []     0    Normal activity, no symptoms []     1    Restricted in physical strenuous activity but ambulatory, able to do out light work []     2    Ambulatory and capable of self care, unable to do work activities, up and about                 more than 50%  Of the time                            []     3    Only limited self care, in bed greater than 50% of waking hours []     4    Completely disabled, no self care, confined to bed or chair []     5    Moribund  Past Medical History:  Diagnosis Date  . Alcohol dependency (HCC)    Hx ETOH withdrawal seizures before 2011  . CAD (coronary artery disease) 06/13/2012   Calcification noted on CTA of chest in 2012 Wall  motion abnormality on ECHO    . Carotid artery disease (HCC) 2016   bilateral.  s/p left carotid stent 03/2015  . 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   Marland Kitchen Depression with anxiety   . GERD (gastroesophageal reflux disease)   . Headache(784.0)    migraine  . Heart disease 02/2015   PCI/DES placed to RCA: on chronic Plavix/ASA  . Hyperlipidemia   . Hypertension   . Pancreatitis 2011....    CT findings in May 2011 with inflammation and pseudocyst.  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/2016  . Renal artery stenosis, native, bilateral (HCC) 02/2015    Past Surgical History:  Procedure Laterality Date  . CARDIAC CATHETERIZATION N/A 03/15/2015   Procedure: Left Heart Cath;  Surgeon: Laurier Nancy, MD;  Location: [facility] INVASIVE CV LAB;  Service: Cardiovascular;  Laterality: N/A;  . CARDIAC CATHETERIZATION N/A 03/16/2015   Procedure: Coronary Stent Intervention;   Surgeon: Alwyn Pea, MD;  Location: [facility] INVASIVE CV LAB;  Service: Cardiovascular;  Laterality: N/A;  . CAROTID ANGIOGRAM N/A 06/15/2012   Procedure: CAROTID ANGIOGRAM;  Surgeon: Chuck Hint, MD;  Location: [facility] CATH LAB;  Service: Cardiovascular;  Laterality: N/A;  . ESOPHAGOGASTRODUODENOSCOPY N/A 03/08/2016   Procedure: ESOPHAGOGASTRODUODENOSCOPY (EGD);  Surgeon: Ruffin Frederick, MD;  Location: [facility] ENDOSCOPY;  Service: Gastroenterology;  Laterality: N/A;  . none    . PERIPHERAL VASCULAR CATHETERIZATION Left 04/06/2015   Procedure: Carotid PTA/Stent Intervention;  Surgeon: Annice Needy, MD;  Location: [facility] INVASIVE CV LAB;  Service: Cardiovascular;  Laterality: Left;    Social History   Tobacco Use  Smoking Status Current Every Day Smoker  . Packs/day: 1.00  . Years: 30.00  . Pack years: 30.00  . Types: Cigarettes  Smokeless Tobacco Never Used  Tobacco Comment   smoking less    Social History   Substance and Sexual Activity  Alcohol Use No  . Alcohol/week: 6.0 standard drinks  . Types: 6 Cans of beer per week   Comment: drinks 6-12 beer daily     Allergies  Allergen Reactions  . No Known Allergies     Current Facility-Administered Medications  Medication Dose Route Frequency Provider Last Rate Last Admin  . acetaminophen (TYLENOL) tablet 650 mg  650 mg Oral Q6H PRN Therisa Doyne, MD   650 mg at 02/09/20 2013   Or  . acetaminophen (TYLENOL) suppository 650 mg  650 mg Rectal Q6H PRN Doutova, Anastassia, MD      . albuterol (PROVENTIL) (2.5 MG/3ML) 0.083% nebulizer solution 2.5 mg  2.5 mg Nebulization Q6H PRN Doutova, Anastassia, MD      . busPIRone (BUSPAR) tablet 10 mg  10 mg Oral BID Brandon Elk, MD   10 mg at 02/10/20 1610  . cefTRIAXone (ROCEPHIN) 2 g in sodium chloride 0.9 % 100 mL IVPB  2 g Intravenous Q24H Della Goo, RPH 200 mL/hr at 02/09/20 1225 New Bag at 02/09/20 1225  . Chlorhexidine Gluconate Cloth 2 % PADS 6 each  6  each Topical Daily Brandon Elk, MD   6 each at 02/10/20 [phone]  . divalproex (DEPAKOTE ER) 24 hr tablet 500 mg  500 mg Oral Daily Doutova, Anastassia, MD   500 mg at 02/10/20 0923  . docusate sodium (COLACE) capsule 100 mg  100 mg Oral BID Therisa Doyne, MD   100 mg at 02/10/20 0923  . famotidine (PEPCID) tablet 20 mg  20 mg Oral  BID Toy Baker, MD   20 mg at 02/10/20 0924  . feeding supplement (GLUCERNA SHAKE) (GLUCERNA SHAKE) liquid 237 mL  237 mL Oral TID BM [place] Eric J, DO   237 mL at 02/10/20 0924  . feeding supplement (PRO-STAT SUGAR FREE 64) liquid 30 mL  30 mL Oral BID [place] Eric J, DO   30 mL at 02/10/20 3329  . folic acid (FOLVITE) tablet 1 mg  1 mg Oral Daily Doutova, Anastassia, MD   1 mg at 02/10/20 0923  . HYDROcodone-acetaminophen (NORCO/VICODIN) 5-325 MG per tablet 1-2 tablet  1-2 tablet Oral Q4H PRN Toy Baker, MD   2 tablet at 02/09/20 1658  . insulin aspart (novoLOG) injection 0-15 Units  0-15 Units Subcutaneous TID WC Charlynne Cousins, MD   8 Units at 02/09/20 1659  . insulin aspart (novoLOG) injection 0-5 Units  0-5 Units Subcutaneous QHS Charlynne Cousins, MD      . insulin aspart (novoLOG) injection 4 Units  4 Units Subcutaneous TID WC Charlynne Cousins, MD   4 Units at 02/09/20 1659  . insulin detemir (LEVEMIR) injection 10 Units  10 Units Subcutaneous BID Charlynne Cousins, MD   10 Units at 02/10/20 [phone]  . metoprolol tartrate (LOPRESSOR) tablet 50 mg  50 mg Oral BID Charlynne Cousins, MD   50 mg at 02/10/20 4166  . morphine 2 MG/ML injection 2 mg  2 mg Intravenous Q2H PRN Charlynne Cousins, MD   2 mg at 02/09/20 2142  . multivitamin with minerals tablet 1 tablet  1 tablet Oral Daily Doutova, Anastassia, MD   1 tablet at 02/10/20 0923  . nicotine (NICODERM CQ - dosed in mg/24 hours) patch 21 mg  21 mg Transdermal Daily Doutova, Nyoka Lint, MD   21 mg at 02/10/20 0925  . ondansetron (ZOFRAN) tablet 4 mg  4 mg Oral Q6H  PRN Doutova, Anastassia, MD       Or  . ondansetron (ZOFRAN) injection 4 mg  4 mg Intravenous Q6H PRN Doutova, Anastassia, MD      . polyethylene glycol (MIRALAX / GLYCOLAX) packet 17 g  17 g Oral BID Charlynne Cousins, MD   17 g at 02/10/20 0630  . simvastatin (ZOCOR) tablet 20 mg  20 mg Oral q1800 Toy Baker, MD   20 mg at 02/09/20 1658  . sodium chloride flush (NS) 0.9 % injection 10-40 mL  10-40 mL Intracatheter PRN Charlynne Cousins, MD      . sodium chloride flush (NS) 0.9 % injection 3 mL  3 mL Intravenous Q12H Toy Baker, MD   3 mL at 02/10/20 0924  . thiamine tablet 100 mg  100 mg Oral Daily Doutova, Anastassia, MD   100 mg at 02/10/20 1601   Or  . thiamine (B-1) injection 100 mg  100 mg Intravenous Daily Doutova, Anastassia, MD      . traZODone (DESYREL) tablet 150 mg  150 mg Oral QHS Toy Baker, MD   150 mg at 02/09/20 2134    Medications Prior to Admission  Medication Sig Dispense Refill Last Dose  . busPIRone (BUSPAR) 10 MG tablet Take 10 mg by mouth 2 (two) times daily.   02/04/2020 at Unknown time  . clopidogrel (PLAVIX) 75 MG tablet Take 75 mg by mouth daily.    02/04/2020 at Unknown time  . cyclobenzaprine (FLEXERIL) 10 MG tablet Take 10 mg by mouth every 12 (twelve) hours.  0 02/04/2020 at Unknown time  . famotidine (PEPCID) 20  MG tablet Take 20 mg by mouth 2 (two) times daily.   2 02/04/2020 at Unknown time  . lisinopril (PRINIVIL,ZESTRIL) 10 MG tablet Take 10 mg by mouth daily.   4 02/04/2020 at Unknown time  . metFORMIN (GLUCOPHAGE) 1000 MG tablet Take 1 tablet (1,000 mg total) by mouth 2 (two) times daily with a meal. 60 tablet 0 02/04/2020 at Unknown time  . metoprolol tartrate (LOPRESSOR) 100 MG tablet Take 100 mg by mouth daily.   02/04/2020 at 0800  . simvastatin (ZOCOR) 20 MG tablet Take 20 mg by mouth daily at 6 PM.    02/04/2020 at Unknown time    Family History  Problem Relation Age of Onset  . Stroke Mother        deceased  .  Coronary artery disease Mother   . Alcohol abuse Mother   . Cancer Mother   . Hypertension Father        alive  . Alcohol abuse Father   . Diabetes Father   . Kidney disease Father   . Stroke Maternal Grandmother    Review of Systems:   ROS Pertinent items are noted in HPI.     Cardiac Review of Systems: Y or  [    ]= no  Chest Pain [  N  ]  Resting SOB [ N  ] Exertional SOB  [N  ]  Orthopnea [  ]   Pedal Edema [   ]    Palpitations [  ] Syncope  [  ]   Presyncope [   ]  General Review of Systems: [Y] = yes [  ]=no Constitional: recent weight change [  ]; anorexia [  ]; fatigue [ Y ]; nausea [  ]; night sweats [  ]; fever [Y  ]; or chills [  ]                                                               Dental: Last Dentist visit:   Eye : blurred vision [  ]; diplopia [   ]; vision changes [  ];  Amaurosis fugax[  ]; Resp: cough [  ];  wheezing[  ];  hemoptysis[  ]; shortness of breath[  ]; paroxysmal nocturnal dyspnea[  ]; dyspnea on exertion[  ]; or orthopnea[  ];  GI:  gallstones[  ], vomiting[  ];  dysphagia[  ]; melena[  ];  hematochezia [  ]; heartburn[  ];   Hx of  Colonoscopy[  ]; GU: kidney stones [  ]; hematuria[  ];   dysuria [  ];  nocturia[  ];  history of     obstruction [  ]; urinary frequency [  ]             Skin: rash, swelling[  ];, hair loss[  ];  peripheral edema[  ];  or itching[  ]; Musculosketetal: myalgias[  ];  joint swelling[  ];  joint erythema[  ];  joint pain[  ];  back pain[ Y ];  Heme/Lymph: bruising[  ];  bleeding[  ];  anemia[  ];  Neuro: TIA[  ];  headaches[  ];  Stroke[Y  ];  vertigo[  ];  seizures[  ];   paresthesias[  ];  difficulty walking[Y  ];  Psych:depression[  ]; anxiety[  ];  Endocrine: diabetes[  ];  thyroid dysfunction[  ];             Poor Dentition   Physical Exam: BP (!) 136/98 (BP Location: Left Arm)   Pulse 94   Temp 98.5 F (36.9 C) (Oral)   Resp 15   Ht 6' (1.829 m)   Wt 81.6 kg   SpO2 99%   BMI 24.41 kg/m   General  appearance: alert, cooperative and appears chronically ill, Head: Normocephalic, without obvious abnormality, atraumatic Resp: clear to auscultation bilaterally Cardio: regular rate and rhythm GI: soft, non-tender; bowel sounds normal; no masses,  no organomegaly Neurologic: Grossly normal  Diagnostic Studies & Laboratory data:     Recent Radiology Findings:   CT IMAGE GUIDED FLUID DRAIN BY CATHETER  Result Date: 02/08/2020 INDICATION: 54 year old male with a history right posterior chest empyema and associated osteomyelitis discitis. He has been referred for chest tube placement. EXAM: CT-GUIDED CHEST TUBE PLACEMENT, RIGHT EMPYEMA MEDICATIONS: The patient is currently admitted to the hospital and receiving intravenous antibiotics. The antibiotics were administered within an appropriate time frame prior to the initiation of the procedure. ANESTHESIA/SEDATION: Fentanyl 50 mcg IV; Versed 0 mg IV Moderate Sedation Time:  None The patient was continuously monitored during the procedure by the interventional radiology nurse under my direct supervision. COMPLICATIONS: None PROCEDURE: Informed written consent was obtained from the patient after a thorough discussion of the procedural risks, benefits and alternatives. All questions were addressed. Maximal Sterile Barrier Technique was utilized including caps, mask, sterile gowns, sterile gloves, sterile drape, hand hygiene and skin antiseptic. A timeout was performed prior to the initiation of the procedure. Patient was positioned left decubitus position on the CT gantry table. Scout CT was acquired for planning purposes. After initial CT was performed the patient is prepped and draped in the usual sterile fashion. 1% lidocaine was used for local anesthesia. Yueh catheter was placed into the empyema with aspirate performed for culture. Modified Seldinger technique was then used to place a 12 [place] drain. Drain was attached to evacuation chamber. Drain was  sutured in position. Final image was stored. Patient tolerated the procedure well and remained hemodynamically stable throughout. No complications were encountered and no significant blood loss. IMPRESSION: Status post CT-guided drain placement into right-sided empyema. Frank pus was aspirated and a culture was sent. Signed, Yvone Neu. Reyne Dumas, RPVI [facility] Electronically Signed   By: Gilmer Mor D.O.   On: 02/08/2020 11:19   [place] EKG SITE RITE  Result Date: 02/09/2020 If Site Rite image not attached, placement could not be confirmed due to current cardiac rhythm.    I have independently reviewed the above radiologic studies and discussed with the patient   Recent Lab Findings: Lab Results  Component Value Date   WBC 6.8 02/09/2020   HGB 9.2 (L) 02/09/2020   HCT 28.8 (L) 02/09/2020   PLT 343 02/09/2020   GLUCOSE 172 (H) 02/09/2020   CHOL 74 03/06/2016   TRIG 51 03/06/2016   HDL 27 (L) 03/06/2016   LDLCALC 37 03/06/2016   ALT 11 02/07/2020   AST 13 (L) 02/07/2020   NA 133 (L) 02/09/2020   K 4.4 02/09/2020   CL 100 02/09/2020   CREATININE 0.54 (L) 02/09/2020   BUN 13 02/09/2020   CO2 26 02/09/2020   TSH 1.398 02/07/2020   INR 1.1 02/07/2020   HGBA1C 7.1 (H) 02/08/2020    Assessment /  Plan:      1. Small right sided posterior pleural effusion- IR drain has been placed, thick purulent fluid in the tubing 2. Alcoholic cirrhosis 3. Chronic Pancreatitis 4. Spinal abscess- on ABX per ID 5. Dispo- right sided pleural effusion is due to spinal abscess, this it not felt to be a loculated empyema.  Agree with IR drain and continued ABX.Marland Kitchen no thoracic surgery indicated at this time.  All images and plan discussed with Dr. Cliffton Asters.  He will follow up this afternoon after his clinic.  I  spent 55 minutes counseling the patient face to face.   Lowella Dandy, PA-C  02/10/2020 16:58 AM  54 year old male with a posterior  right-sided perispinal fluid collection that was spotting that is likely related to a spinal abscess.  Fluid collection noted on cross-sectional imaging is likely extra pleural thus surgical decortication is not warranted at this point.  I agree with image guided drain placement.  Shondrika Hoque Keane Scrape

## 2020-02-10 NOTE — Plan of Care (Signed)

## 2020-02-10 NOTE — Progress Notes (Signed)
Physical Therapy Treatment Patient Details Name: Brandon [name] MRN: [id number] DOB: 1965-10-20 Today's Date: 02/10/2020    History of Present Illness Brandon [name] is a 54 year old Caucasian male with past medical history notable for EtOH abuse, CVA, EtOH cirrhosis, seizure disorder, chronic pancreatitis, HTN, type 2 diabetes mellitus, HLD with generalized fatigue, and progressive back pain.  Thought he had exacerbation of his chornic pancreatitis, MR T-spine notable for discitis/osteomyelitis at T9/10 and T10/11 with ventral epidural abscess measuring up to 5 mm in thickness and causing moderate T9/10 and severe T10/11 spinal canal stenosis. PICC line placed 5/19.    PT Comments    Pt is self-limiting during this session, refusing to ambulate other than to get out of bed and to the recliner. Pt requiring less physical assistance to stand and ambulate a short distance this session, however does appear to be providing great effort to transfer. Pt also requires cues for direction during transfer and demonstrates shuffling gait pattern which increases his falls risk. Pt will benefit from continued acute PT intervention to improve gait and transfer quality and to reduce falls risk. PT continues to recommend SNF at this time due to poor activity tolerance and falls risk.   Follow Up Recommendations  SNF;Supervision/Assistance - 24 hour     Equipment Recommendations  Wheelchair (measurements PT);Wheelchair cushion (measurements PT)    Recommendations for Other Services       Precautions / Restrictions Precautions Precautions: Fall Restrictions Weight Bearing Restrictions: No    Mobility  Bed Mobility Overal bed mobility: Needs Assistance Bed Mobility: Supine to Sit     Supine to sit: Supervision        Transfers Overall transfer level: Needs assistance Equipment used: Rolling walker (2 wheeled) Transfers: Sit to/from Stand Sit to Stand: Min guard             Ambulation/Gait Ambulation/Gait assistance: Min guard Gait Distance (Feet): 5 Feet Assistive device: Rolling walker (2 wheeled) Gait Pattern/deviations: Step-to pattern;Trunk flexed Gait velocity: reduced Gait velocity interpretation: <1.31 ft/sec, indicative of household ambulator General Gait Details: pt with short step to gait with reduced step length, short shuffling steps, requires cueing during turn towards Engineer, production    Modified Rankin (Stroke Patients Only)       Balance Overall balance assessment: Needs assistance Sitting-balance support: Single extremity supported;Feet supported Sitting balance-Leahy Scale: Good Sitting balance - Comments: supervision at edge of bed   Standing balance support: Bilateral upper extremity supported Standing balance-Leahy Scale: Fair Standing balance comment: minG with BUE support of RW                            Cognition Arousal/Alertness: Awake/alert Behavior During Therapy: Flat affect Overall Cognitive Status: Within Functional Limits for tasks assessed                                        Exercises      General Comments General comments (skin integrity, edema, etc.): pt tachy during session into low 100s-110s, pt on 3L [place] upon arrival      Pertinent Vitals/Pain Pain Assessment: Faces Faces Pain Scale: Hurts even more Pain Location: back Pain Descriptors / Indicators: Grimacing Pain Intervention(s): Monitored during session    Home Living  Prior Function            PT Goals (current goals can now be found in the care plan section) Acute Rehab PT Goals Patient Stated Goal: none stated Progress towards PT goals: Not progressing toward goals - comment(self-limiting this session, refusing ambulation)    Frequency    Min 3X/week      PT Plan Current plan remains appropriate    Co-evaluation               AM-PAC PT "6 Clicks" Mobility   Outcome Measure  Help needed turning from your back to your side while in a flat bed without using bedrails?: None Help needed moving from lying on your back to sitting on the side of a flat bed without using bedrails?: None Help needed moving to and from a bed to a chair (including a wheelchair)?: A Little Help needed standing up from a chair using your arms (e.g., wheelchair or bedside chair)?: A Little Help needed to walk in hospital room?: A Lot Help needed climbing 3-5 steps with a railing? : Total 6 Click Score: 17    End of Session Equipment Utilized During Treatment: Oxygen Activity Tolerance: Other (comment)(pt refusing further ambulation/activity) Patient left: in chair;with call bell/phone within reach;with chair alarm set Nurse Communication: Mobility status PT Visit Diagnosis: Other abnormalities of gait and mobility (R26.89);Muscle weakness (generalized) (M62.81)     Time: 6063-0160 PT Time Calculation (min) (ACUTE ONLY): 14 min  Charges:  $Therapeutic Activity: 8-22 mins                    Zenaida Niece, PT, DPT Acute Rehabilitation Pager: [phone]    Zenaida Niece 02/10/2020, 12:33 PM

## 2020-02-10 NOTE — Progress Notes (Signed)
Referring Physician(s): [place], Alvira Philips  Supervising Physician: Gilmer Mor  Patient Status:  [facility] - In-pt  Chief Complaint:  Right empyema  Brief History:  Brandon [name] is a 54 y.o. male History of EtOH dependency,CVA,  hepatic cirrhosis and chronic pancreatitis.  He presented to the ED on 02/06/20 with generalized fatigue and progressive back pain X 3 months.   He was found to have discitis and a right sided posterior emyema.    S/p right chest tube placement in IR 02/08/2020 by Dr. Loreta Ave.  CT surgery note - " right sided pleural effusion is due to spinal abscess, this it not felt to be a loculated empyema.  Agree with IR drain and continued ABX.Marland Kitchen no thoracic surgery indicated at this time.  "   Subjective:  Patient sleeping.  Allergies: No known allergies  Medications: Prior to Admission medications   Medication Sig Start Date End Date Taking? Authorizing Provider  busPIRone (BUSPAR) 10 MG tablet Take 10 mg by mouth 2 (two) times daily. 12/28/19  Yes [provider]  clopidogrel (PLAVIX) 75 MG tablet Take 75 mg by mouth daily.    Yes [provider]  cyclobenzaprine (FLEXERIL) 10 MG tablet Take 10 mg by mouth every 12 (twelve) hours. 01/08/17  Yes [provider]  famotidine (PEPCID) 20 MG tablet Take 20 mg by mouth 2 (two) times daily.  01/08/17  Yes [provider]  lisinopril (PRINIVIL,ZESTRIL) 10 MG tablet Take 10 mg by mouth daily.  01/12/17  Yes [provider]  metFORMIN (GLUCOPHAGE) 1000 MG tablet Take 1 tablet (1,000 mg total) by mouth 2 (two) times daily with a meal. 03/29/16  Yes Angiulli, Mcarthur Rossetti, PA-C  metoprolol tartrate (LOPRESSOR) 100 MG tablet Take 100 mg by mouth daily.   Yes [provider]  simvastatin (ZOCOR) 20 MG tablet Take 20 mg by mouth daily at 6 PM.    Yes [provider]     Vital Signs: BP (!) 136/98 (BP Location: Left Arm)    Pulse 94    Temp 98.5 F (36.9 C) (Oral)     Resp 15    Ht 6' (1.829 m)    Wt 81.6 kg    SpO2 99%    BMI 24.41 kg/m   Physical Exam Constitutional:      Appearance: Normal appearance.  HENT:     Head: Normocephalic and atraumatic.  Cardiovascular:     Rate and Rhythm: Normal rate.  Pulmonary:     Effort: Pulmonary effort is normal. No respiratory distress.     Comments: Right chest tube in place, still to suction. Purulent drainage in tubing and pleur-evac. Skin:    General: Skin is warm and dry.  Neurological:     General: No focal deficit present.     Imaging: MR THORACIC SPINE W WO CONTRAST  Result Date: 02/06/2020 CLINICAL DATA:  Back pain and suspected discitis EXAM: MRI THORACIC WITHOUT AND WITH CONTRAST TECHNIQUE: Multiplanar and multiecho pulse sequences of the thoracic spine were obtained without and with intravenous contrast. CONTRAST:  75mL GADAVIST GADOBUTROL 1 MMOL/ML IV SOLN COMPARISON:  None. FINDINGS: MRI THORACIC SPINE FINDINGS Alignment:  Physiologic. Vertebrae: At T9, T10 and T11 there is hyperintense T2-weighted signal, loss of normal T1-weighted signal and diffuse contrast enhancement of the bone marrow. There is fluid within both disc spaces, worse at the lower level. Cord:  Normal signal and morphology. Paraspinal and other soft tissues: Empyema of the right posterior chest measures 4.7  cm. There is a large amount of paravertebral phlegmon at the T9-T11 levels. There is a ventral epidural abscess extending from T8-T11 that measures up to 5 mm in thickness. Disc levels: T9-10 and T10-11 discitis-osteomyelitis and epidural collection results in moderate T9-10 stenosis and severe T10-11 spinal canal stenosis. There is severe bilateral neural foraminal stenosis at both levels. The other thoracic disc spaces are unremarkable. IMPRESSION: 1. 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 There is  severe bilateral neural foraminal stenosis at both levels. 2. Large amount of paravertebral phlegmon at the T9-T11 levels. 3. Empyema of the right posterior chest. Electronically Signed   By: Deatra RobinsonKevin  Herman M.D.   On: 02/06/2020 19:42   CT Abdomen Pelvis W Contrast  Result Date: 02/06/2020 CLINICAL DATA:  Prior abnormal chest x-ray with possible free air. EXAM: CT ABDOMEN AND PELVIS WITH CONTRAST TECHNIQUE: Multidetector CT imaging of the abdomen and pelvis was performed using the standard protocol following bolus administration of intravenous contrast. CONTRAST:  100mL OMNIPAQUE IOHEXOL 300 MG/ML  SOLN COMPARISON:  None. FINDINGS: Lower chest: Lung bases demonstrate a loculated peripherally enhancing fluid collection in the posterior pleural space on the right. This corresponds to the density seen on recent plain film examination. Some associated compressive atelectasis is seen as well. Hepatobiliary: Gallbladder is well distended with multiple dependent gallbladder stones. The liver is mildly fatty infiltrated. Intrahepatic biliary ductal dilatation is seen which may be related to a distal common bile duct stone. Pancreas: Pancreas demonstrates diffuse calcifications as well as mild pancreatic ductal dilatation. Hypodensity is noted in the region of the head and uncinate process best seen on image number 28 of series 3. This is new from the prior exam and may represent a focal pancreatic head mass. It measures approximately 16 mm. Scattered calcifications are again seen similar to that noted on the prior exam consistent with prior chronic pancreatitis. Additionally previously seen left upper quadrant pseudocyst is noted but improved in size when compared with the prior exam. It lies predominately lateral to the spleen but courses along the anterior aspect of the spleen posterior to the stomach. Small pseudocyst is also noted adjacent to the head of the pancreas best seen on image number 30 of series 3. Spleen:  Within normal limits. Adrenals/Urinary Tract: Adrenal glands are stable in appearance. Mild fullness of the left adrenal gland is seen. The kidneys demonstrate a normal enhancement pattern bilaterally. No obstructive changes are seen. Right renal cyst is noted. The bladder is well distended. Stomach/Bowel: Colon shows no obstructive or inflammatory changes. The abnormality seen on recent chest x-ray in the right upper quadrant represents interposition of colon between the diaphragm and anterior aspect of the liver. No free air is seen. No small bowel abnormality is noted. The appendix is unremarkable. Stomach is decompressed. Vascular/Lymphatic: Atherosclerotic calcifications of the abdominal aorta are noted. 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 This is likely related to the patient's previous pancreatitis episodes. 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 Reproductive: Prostate is unremarkable. Other: No free air or free fluid is noted. Musculoskeletal: There are changes at T9-T10 and T10-T11 with erosive changes of the endplates at both levels and perispinal inflammatory changes and enhancement consistent with discitis and paraspinal abscess. The loculated fluid collection on the right in the pleural space  may be related to these changes as well. Degenerative changes of lumbar spine are seen. IMPRESSION: Changes highly suspicious for discitis at T10-T11 and T9-T10 with changes of paraspinal abscesses. The fluid collection loculated in the posterior right pleural space may represent sequelae from these changes as well. MRI of the thoracic spine with contrast is recommended for further evaluation. Changes consistent with chronic pancreatitis and persistent but smaller pseudocysts particularly surrounding the spleen and stomach. Hypodensity is noted in the region of the  head/uncinate process as described above. This is somewhat suspicious for underlying mass given the new biliary ductal dilatation. MRI of the pancreas is recommended on an outpatient basis to allow for optimum imaging. Cavernous transformation of the portal vein with increase in the size of perigastric varices. No evidence of free air. The abnormality seen on prior chest x-ray is related to interposition of the colon between the liver in the diaphragm. Electronically Signed   By: Inez Catalina M.D.   On: 02/06/2020 16:06   DG Chest Portable 1 View  Result Date: 02/06/2020 CLINICAL DATA:  Cough EXAM: PORTABLE CHEST 1 VIEW COMPARISON:  03/08/2016 FINDINGS: Cardiac shadow is within normal limits. The lungs are well aerated bilaterally. Rounded soft tissue density is noted adjacent to the right cardiac border. This was not present on a prior CT examination from 2017. This may represent a rounded pneumonia or possible pericardial cyst lungs are otherwise clear. There is apparent free air under the right hemidiaphragm. IMPRESSION: No focal infiltrate is seen. Findings suspicious for free air under the right hemidiaphragm. Soft tissue density along the right heart border of uncertain significance. CT of the abdomen and pelvis is recommended for further evaluation and characterization of these findings. Electronically Signed   By: Inez Catalina M.D.   On: 02/06/2020 14:16   ECHOCARDIOGRAM COMPLETE  Result Date: 02/07/2020    ECHOCARDIOGRAM REPORT   Patient Name:   Brandon [name] Date of Exam: 02/07/2020 Medical Rec #:  [id number]        Height:       72.0 in Accession #:    [id number]       Weight:       180.0 lb Date of Birth:  1966-06-05        BSA:          2.037 m Patient Age:    72 years         BP:           96/71 mmHg Patient Gender: M                HR:           94 bpm. Exam Location:  Inpatient Procedure: 2D Echo Indications:    diskitis  History:        Patient has prior history of Echocardiogram  examinations, most                 recent 03/07/2016. CAD; Risk Factors:Hypertension, Dyslipidemia                 and Current Smoker. CVA. alcohol dependency.  Sonographer:    Jannett Celestine RDCS (AE) Referring Phys: [place]  Sonographer Comments: poor positioning (see comments) IMPRESSIONS  1. Left ventricular ejection fraction, by estimation, is 55 to 60%. The left ventricle has normal function. The left ventricle has no regional wall motion abnormalities. Left ventricular diastolic parameters were normal.  2. Right ventricular systolic function is normal. The right ventricular  size is normal.  3. Left atrial size was mildly dilated.  4. The mitral valve is normal in structure. Trivial mitral valve regurgitation. No evidence of mitral stenosis.  5. The aortic valve is grossly normal. Aortic valve regurgitation is not visualized. Mild aortic valve sclerosis is present, with no evidence of aortic valve stenosis. Comparison(s): No significant change from prior study. FINDINGS  Left Ventricle: Left ventricular ejection fraction, by estimation, is 55 to 60%. The left ventricle has normal function. The left ventricle has no regional wall motion abnormalities. The left ventricular internal cavity size was normal in size. There is  no left ventricular hypertrophy. Left ventricular diastolic parameters were normal. Right Ventricle: The right ventricular size is normal. No increase in right ventricular wall thickness. Right ventricular systolic function is normal. Left Atrium: Left atrial size was mildly dilated. Right Atrium: Right atrial size was normal in size. Pericardium: There is no evidence of pericardial effusion. Mitral Valve: The mitral valve is normal in structure. Trivial mitral valve regurgitation. No evidence of mitral valve stenosis. Tricuspid Valve: The tricuspid valve is normal in structure. Tricuspid valve regurgitation is trivial. No evidence of tricuspid stenosis. Aortic Valve: The aortic  valve is grossly normal. Aortic valve regurgitation is not visualized. Mild aortic valve sclerosis is present, with no evidence of aortic valve stenosis. There is mild calcification of the aortic valve. Pulmonic Valve: The pulmonic valve was not well visualized. Pulmonic valve regurgitation is not visualized. No evidence of pulmonic stenosis. Aorta: The aortic root is normal in size and structure. Venous: The inferior vena cava was not well visualized. IAS/Shunts: No atrial level shunt detected by color flow Doppler.  LEFT VENTRICLE PLAX 2D LVIDd:         4.40 cm  Diastology LVIDs:         3.10 cm  LV e' lateral:   11.90 cm/s LV PW:         1.10 cm  LV E/e' lateral: 6.6 LV IVS:        1.10 cm  LV e' medial:    7.94 cm/s LVOT diam:     2.20 cm  LV E/e' medial:  9.9 LV SV:         72 LV SV Index:   35 LVOT Area:     3.80 cm  RIGHT VENTRICLE RV S prime:     20.80 cm/s TAPSE (M-mode): 2.0 cm LEFT ATRIUM             Index LA diam:        3.40 cm 1.67 cm/m LA Vol (A2C):   44.3 ml 21.74 ml/m LA Vol (A4C):   54.4 ml 26.68 ml/m LA Biplane Vol: 55.6 ml 27.29 ml/m  AORTIC VALVE LVOT Vmax:   77.00 cm/s LVOT Vmean:  59.700 cm/s LVOT VTI:    0.189 m  AORTA Ao Root diam: 3.70 cm MITRAL VALVE MV Area (PHT): 5.54 cm    SHUNTS MV Decel Time: 137 msec    Systemic VTI:  0.19 m MV E velocity: 78.40 cm/s  Systemic Diam: 2.20 cm MV A velocity: 85.70 cm/s MV E/A ratio:  0.91 Jodelle Red MD Electronically signed by Jodelle Red MD Signature Date/Time: 02/07/2020/1:51:11 PM    Final    IR US CHEST  Result Date: 02/07/2020 CLINICAL DATA:  Thoracic spondylo discitis, loculated right pleural collection concerning for empyema. EXAM: CHEST ULTRASOUND COMPARISON:  02/06/2020 FINDINGS: Ultrasound performed of the chest. Unfortunately unable to visualize the small loculated posteromedial fluid collection  within the chest by ultrasound. Thoracentesis not performed. IMPRESSION: Unable to visualize the small loculated  posteromedial right pleural fluid collection. Electronically Signed   By: Judie Petit.  Shick M.D.   On: 02/07/2020 13:48   CT IMAGE GUIDED FLUID DRAIN BY CATHETER  Result Date: 02/08/2020 INDICATION: 54 year old male with a history right posterior chest empyema and associated osteomyelitis discitis. He has been referred for chest tube placement. EXAM: CT-GUIDED CHEST TUBE PLACEMENT, RIGHT EMPYEMA MEDICATIONS: The patient is currently admitted to the hospital and receiving intravenous antibiotics. The antibiotics were administered within an appropriate time frame prior to the initiation of the procedure. ANESTHESIA/SEDATION: Fentanyl 50 mcg IV; Versed 0 mg IV Moderate Sedation Time:  None The patient was continuously monitored during the procedure by the interventional radiology nurse under my direct supervision. COMPLICATIONS: None PROCEDURE: Informed written consent was obtained from the patient after a thorough discussion of the procedural risks, benefits and alternatives. All questions were addressed. Maximal Sterile Barrier Technique was utilized including caps, mask, sterile gowns, sterile gloves, sterile drape, hand hygiene and skin antiseptic. A timeout was performed prior to the initiation of the procedure. Patient was positioned left decubitus position on the CT gantry table. Scout CT was acquired for planning purposes. After initial CT was performed the patient is prepped and draped in the usual sterile fashion. 1% lidocaine was used for local anesthesia. Yueh catheter was placed into the empyema with aspirate performed for culture. Modified Seldinger technique was then used to place a 12 [place] drain. Drain was attached to evacuation chamber. Drain was sutured in position. Final image was stored. Patient tolerated the procedure well and remained hemodynamically stable throughout. No complications were encountered and no significant blood loss. IMPRESSION: Status post CT-guided drain placement into right-sided  empyema. Frank pus was aspirated and a culture was sent. Signed, Yvone Neu. Reyne Dumas, RPVI [facility] Electronically Signed   By: Gilmer Mor D.O.   On: 02/08/2020 11:19   [place] EKG SITE RITE  Result Date: 02/09/2020 If Site Rite image not attached, placement could not be confirmed due to current cardiac rhythm.   Labs:  CBC: Recent Labs    02/06/20 1206 02/07/20 0029 02/08/20 0606 02/09/20 0330  WBC 9.5 8.2 9.2 6.8  HGB 11.4* 9.9* 10.6* 9.2*  HCT 35.7* 30.7* 33.6* 28.8*  PLT 499* 407* 407* 343    COAGS: Recent Labs    02/07/20 0029  INR 1.1    BMP: Recent Labs    02/06/20 1206 02/07/20 0029 02/08/20 0606 02/09/20 0330  NA 137 134* 135 133*  K 3.6 3.8 4.2 4.4  CL 100 100 101 100  CO2 23 25 25 26   GLUCOSE 173* 234* 179* 172*  BUN 10 12 13 13   CALCIUM 8.7* 8.7* 8.7* 8.2*  CREATININE 0.49* 0.54* 0.58* 0.54*  GFRNONAA >60 >60 >60 >60  GFRAA >60 >60 >60 >60    LIVER FUNCTION TESTS: Recent Labs    02/06/20 1206 02/07/20 0029  BILITOT 0.3 0.6  AST 13* 13*  ALT 12 11  ALKPHOS 123 113  PROT 8.0 7.2  ALBUMIN 2.6* 2.4*    Assessment and Plan:  CT surgery evaluated patient, not a candidate for VATS at this time.  Consider removing tube when ouput < 20 mL.  Electronically Signed: 02/08/20, PA-C 02/10/2020, 11:22 AM    I spent a total of 15 Minutes at the the patient's bedside AND on the patient's hospital floor or unit, greater than  50% of which was counseling/coordinating care for f/u chest tube.

## 2020-02-10 NOTE — Progress Notes (Signed)
[facility] PROGRESS NOTE    Progress Note  Brandon [name]  RUE:[id number] DOB: [birth year] DOA: 02/06/2020 PCP: Armando Gang, FNP     Brief Narrative:   Brandon [name] is an 54 y.o. male past medical history of alcohol abuse, CVA alcoholic cirrhosis, seizure disorder, chronic pancreatitis, uncontrolled diabetes mellitus presents to the hospital with generalized weakness and progressive back pain he relates that he has been having this back pain for about 3 months progressively getting worst CT scan of the abdomen and pelvis was notable for discitis on T9-T10 and T10 and 1011 with fluid collection noted posteriorly in the right pleural space, MRI of the T-spine confirmed discitis and osteomyelitis with an epidural abscess measuring about 5 mm causing moderate T9-T10 and severe T9 T11 spinal stenosis, large amounts of paravertebral phlegmon at T9 T11 level and empyema of the right posterior chest neurosurgery was consulted for evaluation  Assessment/Plan:   Right lung empyema: IR was consulted and CT-guided drain placement, will contact CT surgery for assistance. Culture from the empyema is growing Strep pneumo.   Blood cultures negative till date. Physical therapy evaluated the patient recommended skilled nursing facility.  Thoracic spine discitis, osteomyelitis with abscess: MRI finding concerning for osteomyelitis with abscess at T9-T11 causing spinal stenosis. TTE with an EF of 55% no vegetation UDS positive for benzos and opiates. Infectious disease and neurosurgery were consulted and appreciate assistance. Neurosurgery recommended conservative management. ID recommended to de-escalate treatment to Rocephin 2 g  6 weeks PICC line placed on 02/10/2020. Blood cultures have shown no growth till date. Patient remains hemodynamically stable and no signs of sepsis.  Hypomagnesemia: Repleted orally now resolved.  Alcohol dependence/abuse: He endorses withdrawal symptoms  in the past. Discontinue Ativan protocol continue thiamine and folate.  Alcoholic cirrhosis: He continues to drink see above for further details.  Chronic pancreatitis: Incidental finding on MRI of the lumbar spine that showed a hypodensity in the pancreas head, will need MRI of the pancreas as an outpatient for surveillance.  Essential hypertension: Blood pressure is improved we will restart metoprolol.  Mildly tachycardic question rebound from metoprolol versus pain.  Diabetes mellitus type 2: With an A1c of 7.1, continue to hold metformin. Continue long-acting insulin plus sliding scale.  Hyperlipidemia: Excellent continue statins.  Seizure disorder: Continue Depakote.  Nicotine use: Continue nicotine patch.   DVT prophylaxis: lovenox Family Communication:none Status is: Inpatient  Remains inpatient appropriate because:Hemodynamically unstable   Dispo: The patient is from: Home              Anticipated d/c is to: Home              Anticipated d/c date is: > 3 days              Patient currently is not medically stable to d/c.  Code Status:     Code Status Orders  (From admission, onward)         Start     Ordered   02/06/20 2130  Full code  Continuous     02/06/20 2129        Code Status History    Date Active Date Inactive Code Status Order ID Comments User Context   03/12/2016 1817 03/29/2016 1546 Full Code [id number]  Lynnae Prude Inpatient   03/05/2016 2146 03/12/2016 1817 Full Code [id number]  Coralie Keens, MD ED   04/06/2015 1030 04/07/2015 1325 Full Code [id number]  Annice Needy, MD Inpatient  03/16/2015 1433 03/17/2015 1321 Full Code [id number]  Dionisio David, MD Inpatient   03/15/2015 1947 03/16/2015 1433 Full Code [id number]  Dionisio David, MD Inpatient   Advance Care Planning Activity    Advance Directive Documentation     Most Recent Value  Type of Advance Directive  Healthcare Power of Attorney  Pre-existing out of facility  DNR order (yellow form or pink MOST form)  --  "MOST" Form in Place?  --        IV Access:    Peripheral IV   Procedures and diagnostic studies:   CT IMAGE GUIDED FLUID DRAIN BY CATHETER  Result Date: 02/08/2020 INDICATION: 54 year old male with a history right posterior chest empyema and associated osteomyelitis discitis. He has been referred for chest tube placement. EXAM: CT-GUIDED CHEST TUBE PLACEMENT, RIGHT EMPYEMA MEDICATIONS: The patient is currently admitted to the hospital and receiving intravenous antibiotics. The antibiotics were administered within an appropriate time frame prior to the initiation of the procedure. ANESTHESIA/SEDATION: Fentanyl 50 mcg IV; Versed 0 mg IV Moderate Sedation Time:  None The patient was continuously monitored during the procedure by the interventional radiology nurse under my direct supervision. COMPLICATIONS: None PROCEDURE: Informed written consent was obtained from the patient after a thorough discussion of the procedural risks, benefits and alternatives. All questions were addressed. Maximal Sterile Barrier Technique was utilized including caps, mask, sterile gowns, sterile gloves, sterile drape, hand hygiene and skin antiseptic. A timeout was performed prior to the initiation of the procedure. Patient was positioned left decubitus position on the CT gantry table. Scout CT was acquired for planning purposes. After initial CT was performed the patient is prepped and draped in the usual sterile fashion. 1% lidocaine was used for local anesthesia. Yueh catheter was placed into the empyema with aspirate performed for culture. Modified Seldinger technique was then used to place a 12 [place] drain. Drain was attached to evacuation chamber. Drain was sutured in position. Final image was stored. Patient tolerated the procedure well and remained hemodynamically stable throughout. No complications were encountered and no significant blood loss. IMPRESSION: Status  post CT-guided drain placement into right-sided empyema. Frank pus was aspirated and a culture was sent. Signed, Dulcy Fanny. Dellia Nims, RPVI [facility] Radiology Electronically Signed   By: Corrie Mckusick D.O.   On: 02/08/2020 11:19   [place] EKG SITE RITE  Result Date: 02/09/2020 If Site Rite image not attached, placement could not be confirmed due to current cardiac rhythm.    Medical Consultants:    None.  Anti-Infectives:   vanc and rocephin  Subjective:    Brandon [name] he continues to complain of back pain.  Objective:    Vitals:   02/09/20 2311 02/10/20 0322 02/10/20 0432 02/10/20 0439  BP:  113/81    Pulse:  99    Resp:  14    Temp: 97.9 F (36.6 C)  98.7 F (37.1 C)   TempSrc: Oral  Oral   SpO2:  92%  90%  Weight:      Height:       SpO2: 90 % O2 Flow Rate (L/min): 3 L/min   Intake/Output Summary (Last 24 hours) at 02/10/2020 0829 Last data filed at 02/10/2020 0626 Gross per 24 hour  Intake 740 ml  Output 2380 ml  Net -1640 ml   Filed Weights   02/06/20 2013  Weight: 81.6 kg    Exam: General exam: In no acute distress. Respiratory system: Good air  movement and clear to auscultation, pigtail catheter in place. Cardiovascular system: S1 & S2 heard, RRR. No JVD. Gastrointestinal system: Abdomen is nondistended, soft and nontender.  Extremities: No pedal edema. Skin: No rashes, lesions or ulcers  Data Reviewed:    Labs: Basic Metabolic Panel: Recent Labs  Lab 02/06/20 1206 02/06/20 1206 02/07/20 0029 02/07/20 0029 02/08/20 0606 02/09/20 0330  NA 137  --  134*  --  135 133*  K 3.6   < > 3.8   < > 4.2 4.4  CL 100  --  100  --  101 100  CO2 23  --  25  --  25 26  GLUCOSE 173*  --  234*  --  179* 172*  BUN 10  --  12  --  13 13  CREATININE 0.49*  --  0.54*  --  0.58* 0.54*  CALCIUM 8.7*  --  8.7*  --  8.7* 8.2*  MG  --   --  1.3*  --  1.5* 2.1  PHOS  --   --  2.9  --   --   --    < > =  values in this interval not displayed.   GFR Estimated Creatinine Clearance: 114.5 mL/min (A) (by C-G formula based on SCr of 0.54 mg/dL (L)). Liver Function Tests: Recent Labs  Lab 02/06/20 1206 02/07/20 0029  AST 13* 13*  ALT 12 11  ALKPHOS 123 113  BILITOT 0.3 0.6  PROT 8.0 7.2  ALBUMIN 2.6* 2.4*   Recent Labs  Lab 02/06/20 1206  LIPASE 84*   Recent Labs  Lab 02/07/20 0029  AMMONIA 22   Coagulation profile Recent Labs  Lab 02/07/20 0029  INR 1.1   COVID-19 Labs  Recent Labs    02/08/20 0606  CRP 6.7*    Lab Results  Component Value Date   SARSCOV2NAA NEGATIVE 02/06/2020    CBC: Recent Labs  Lab 02/06/20 1206 02/07/20 0029 02/08/20 0606 02/09/20 0330  WBC 9.5 8.2 9.2 6.8  NEUTROABS  --  6.2  --   --   HGB 11.4* 9.9* 10.6* 9.2*  HCT 35.7* 30.7* 33.6* 28.8*  MCV 97.3 95.6 98.8 98.0  PLT 499* 407* 407* 343   Cardiac Enzymes: No results for input(s): CKTOTAL, CKMB, CKMBINDEX, TROPONINI in the last 168 hours. BNP (last 3 results) No results for input(s): PROBNP in the last 8760 hours. CBG: Recent Labs  Lab 02/09/20 0356 02/09/20 0807 02/09/20 1139 02/09/20 1652 02/09/20 2132  GLUCAP 176* 132* 290* 275* 121*   D-Dimer: No results for input(s): DDIMER in the last 72 hours. Hgb A1c: Recent Labs    02/08/20 1328  HGBA1C 7.1*   Lipid Profile: No results for input(s): CHOL, HDL, LDLCALC, TRIG, CHOLHDL, LDLDIRECT in the last 72 hours. Thyroid function studies: No results for input(s): TSH, T4TOTAL, T3FREE, THYROIDAB in the last 72 hours.  Invalid input(s): FREET3 Anemia work up: No results for input(s): VITAMINB12, FOLATE, FERRITIN, TIBC, IRON, RETICCTPCT in the last 72 hours. Sepsis Labs: Recent Labs  Lab 02/06/20 1206 02/07/20 0029 02/08/20 0606 02/09/20 0330  WBC 9.5 8.2 9.2 6.8   Microbiology Recent Results (from the past 240 hour(s))  Urine culture     Status: None   Collection Time: 02/06/20  1:24 PM   Specimen: Urine,  Random  Result Value Ref Range Status   Specimen Description URINE, RANDOM  Final   Special Requests NONE  Final   Culture   Final    NO  GROWTH Performed at [facility], [address], [place], [place] [address]    Report Status 02/07/2020 FINAL  Final  Culture, blood (routine x 2)     Status: None (Preliminary result)   Collection Time: 02/06/20  5:05 PM   Specimen: BLOOD  Result Value Ref Range Status   Specimen Description BLOOD RIGHT ANTECUBITAL  Final   Special Requests   Final    BOTTLES DRAWN AEROBIC AND ANAEROBIC Blood Culture adequate volume   Culture   Final    NO GROWTH 4 DAYS Performed at [facility], [address] [place], [place] [address]    Report Status PENDING  Incomplete  SARS Coronavirus 2 by RT PCR (hospital order, performed in [facility] hospital lab) Nasopharyngeal Nasopharyngeal Swab     Status: None   Collection Time: 02/06/20  6:13 PM   Specimen: Nasopharyngeal Swab  Result Value Ref Range Status   SARS Coronavirus 2 NEGATIVE NEGATIVE Final    Comment: (NOTE) SARS-CoV-2 target nucleic acids are NOT DETECTED. The SARS-CoV-2 RNA is generally detectable in upper and lower respiratory specimens during the acute phase of infection. The lowest concentration of SARS-CoV-2 viral copies this assay can detect is 250 copies / mL. A negative result does not preclude SARS-CoV-2 infection and should not be used as the sole basis for treatment or other patient management decisions.  A negative result may occur with improper specimen collection / handling, submission of specimen other than nasopharyngeal swab, presence of viral mutation(s) within the areas targeted by this assay, and inadequate number of viral copies (<250 copies / mL). A negative result must be combined with clinical observations, patient history, and epidemiological information. Fact Sheet for Patients:   BoilerBrush.com.cy Fact Sheet for Healthcare  Providers: https://pope.com/ This test is not yet approved or cleared  by the [place] FDA and has been authorized for detection and/or diagnosis of SARS-CoV-2 by FDA under an Emergency Use Authorization (EUA).  This EUA will remain in effect (meaning this test can be used) for the duration of the COVID-19 declaration under Section 564(b)(1) of the Act, 21 U.S.C. section 360bbb-3(b)(1), unless the authorization is terminated or revoked sooner. Performed at [facility], [address], [place], [place] [address]   Culture, blood (routine x 2)     Status: None (Preliminary result)   Collection Time: 02/06/20  8:40 PM   Specimen: BLOOD  Result Value Ref Range Status   Specimen Description BLOOD RIGHT ANTECUBITAL  Final   Special Requests   Final    BOTTLES DRAWN AEROBIC AND ANAEROBIC Blood Culture adequate volume   Culture   Final    NO GROWTH 4 DAYS Performed at [facility], [address], [place], [place] [address]    Report Status PENDING  Incomplete  MRSA PCR Screening     Status: None   Collection Time: 02/06/20 11:22 PM   Specimen: Nasal Mucosa; Nasopharyngeal  Result Value Ref Range Status   MRSA by PCR NEGATIVE NEGATIVE Final    Comment:        The GeneXpert MRSA Assay (FDA approved for NASAL specimens only), is one component of a comprehensive MRSA colonization surveillance program. It is not intended to diagnose MRSA infection nor to guide or monitor treatment for MRSA infections. Performed at [facility], [address] [place], [place] [address]   Aerobic/Anaerobic Culture (surgical/deep wound)     Status: None (Preliminary result)   Collection Time:  02/08/20  9:56 AM   Specimen: Abscess  Result Value Ref Range Status   Specimen Description ABSCESS RIGHT PLEURAL  Final   Special Requests NONE  Final   Gram Stain   Final    ABUNDANT WBC PRESENT, PREDOMINANTLY PMN ABUNDANT GRAM POSITIVE COCCI IN PAIRS IN  CHAINS    Culture   Final    FEW STREPTOCOCCUS PNEUMONIAE SUSCEPTIBILITIES TO FOLLOW Performed at [facility], [address] [place], [place] [address]    Report Status PENDING  Incomplete     Medications:   . Chlorhexidine Gluconate Cloth  6 each Topical Daily  . divalproex  500 mg Oral Daily  . docusate sodium  100 mg Oral BID  . famotidine  20 mg Oral BID  . feeding supplement (GLUCERNA SHAKE)  237 mL Oral TID BM  . feeding supplement (PRO-STAT SUGAR FREE 64)  30 mL Oral BID  . folic acid  1 mg Oral Daily  . insulin aspart  0-15 Units Subcutaneous TID WC  . insulin aspart  0-5 Units Subcutaneous QHS  . insulin aspart  4 Units Subcutaneous TID WC  . insulin detemir  10 Units Subcutaneous BID  . multivitamin with minerals  1 tablet Oral Daily  . nicotine  21 mg Transdermal Daily  . polyethylene glycol  17 g Oral BID  . simvastatin  20 mg Oral q1800  . sodium chloride flush  3 mL Intravenous Q12H  . thiamine  100 mg Oral Daily   Or  . thiamine  100 mg Intravenous Daily  . traZODone  150 mg Oral QHS   Continuous Infusions: . cefTRIAXone (ROCEPHIN)  IV 200 mL/hr at 02/09/20 1225      LOS: 4 days   Marinda ElkAbraham Feliz Ortiz  [facility]  02/10/2020, 8:29 AM

## 2020-02-10 NOTE — Progress Notes (Signed)
Nutrition Follow-up  DOCUMENTATION CODES:   Not applicable  INTERVENTION:  Continue Glucerna Shake po TID, each supplement provides 220 kcal and 10 grams of protein  Provide 30 ml Prostat po BID, each supplement provides 100 kcal and 15 grams of protein.   Encourage adequate PO intake.   NUTRITION DIAGNOSIS:   Increased nutrient needs related to chronic illness(cirrhosis, pancreatitis) as evidenced by estimated needs; ongoing  GOAL:   Patient will meet greater than or equal to 90% of their needs  MONITOR:   PO intake, Supplement acceptance, Skin, Weight trends, Labs, I & O's  REASON FOR ASSESSMENT:   Consult Assessment of nutrition requirement/status  ASSESSMENT:   54 y.o. male with medical history significant of EtOH abuse, CVA, Cirrhosis, seizure disorder, chronic pancreatitis, HTN, DM 2, HLD presents with generalized fatigue, abdominal pain, and back pain. Admitted for T9-T11 discitis and empyema.  Meal completion has been 100%. Pt reports having a good appetite. Pt reports not eating well prior to admission and states he eats when he can get his hands on, however unable to quantify usual food eaten. Pt currently has Glucerna shake and Prostat ordered and has been consuming them. RD to continue with current orders to aid in increased caloric and protein needs.   Per surgery, right sided pleural effusion is due to spinal abscess and not felt to be loculated empyema.   NUTRITION - FOCUSED PHYSICAL EXAM:    Most Recent Value  Orbital Region  Unable to assess  Upper Arm Region  Unable to assess  Thoracic and Lumbar Region  Unable to assess  Buccal Region  Unable to assess  Temple Region  Unable to assess  Clavicle Bone Region  Moderate depletion  Clavicle and Acromion Bone Region  Moderate depletion  Scapular Bone Region  Unable to assess  Dorsal Hand  Unable to assess  Patellar Region  Moderate depletion  Anterior Thigh Region  Moderate depletion  Posterior Calf  Region  Moderate depletion  Edema (RD Assessment)  None  Hair  Reviewed  Eyes  Reviewed  Mouth  Reviewed  Skin  Reviewed  Nails  Reviewed       Diet Order:   Diet Order            Diet heart healthy/carb modified Room service appropriate? Yes; Fluid consistency: Thin  Diet effective now              EDUCATION NEEDS:   Not appropriate for education at this time  Skin:  Skin Assessment: Reviewed RN Assessment  Last BM:  5/16  Height:   Ht Readings from Last 1 Encounters:  02/06/20 6' (1.829 m)    Weight:   Wt Readings from Last 1 Encounters:  02/06/20 81.6 kg    BMI:  Body mass index is 24.41 kg/m.  Estimated Nutritional Needs:   Kcal:  2200-2450  Protein:  110-125 grams  Fluid:  >/= 2 L/day   Roslyn Smiling, MS, RD, LDN RD pager number/after hours weekend pager number on Amion.

## 2020-02-11 ENCOUNTER — Telehealth: Payer: Self-pay | Admitting: *Deleted

## 2020-02-11 DIAGNOSIS — G062 Extradural and subdural abscess, unspecified: Secondary | ICD-10-CM

## 2020-02-11 DIAGNOSIS — J869 Pyothorax without fistula: Secondary | ICD-10-CM

## 2020-02-11 LAB — BASIC METABOLIC PANEL
Anion gap: 7 (ref 5–15)
BUN: 16 mg/dL (ref 6–20)
CO2: 27 mmol/L (ref 22–32)
Calcium: 8.9 mg/dL (ref 8.9–10.3)
Chloride: 96 mmol/L — ABNORMAL LOW (ref 98–111)
Creatinine, Ser: 0.47 mg/dL — ABNORMAL LOW (ref 0.61–1.24)
GFR calc Af Amer: >60 mL/min (ref >60)
GFR calc non Af Amer: >60 mL/min (ref >60)
Glucose, Bld: 238 mg/dL — ABNORMAL HIGH (ref 70–99)
Potassium: 4.6 mmol/L (ref 3.5–5.1)
Sodium: 130 mmol/L — ABNORMAL LOW (ref 135–145)

## 2020-02-11 LAB — CULTURE, BLOOD (ROUTINE X 2)
Culture: NO GROWTH
Culture: NO GROWTH
Special Requests: ADEQUATE
Special Requests: ADEQUATE

## 2020-02-11 LAB — OSMOLALITY, URINE: Osmolality, Ur: 770 mOsm/kg (ref 300–900)

## 2020-02-11 LAB — GLUCOSE, CAPILLARY
Glucose-Capillary: 122 mg/dL — ABNORMAL HIGH (ref 70–99)
Glucose-Capillary: 371 mg/dL — ABNORMAL HIGH (ref 70–99)

## 2020-02-11 LAB — MAGNESIUM: Magnesium: 1.8 mg/dL (ref 1.7–2.4)

## 2020-02-11 LAB — CREATININE, URINE, RANDOM: Creatinine, Urine: 96.26 mg/dL

## 2020-02-11 LAB — OSMOLALITY: Osmolality: 290 mOsm/kg (ref 275–295)

## 2020-02-11 LAB — SODIUM, URINE, RANDOM: Sodium, Ur: 112 mmol/L

## 2020-02-11 MED ORDER — INSULIN DETEMIR 100 UNIT/ML ~~LOC~~ SOLN
30.0000 [IU] | Freq: Two times a day (BID) | SUBCUTANEOUS | Status: DC
  Administered 2020-02-11 – 2020-02-12 (×3): 30 [IU] via SUBCUTANEOUS
  Filled 2020-02-11 (×4): qty 0.3

## 2020-02-11 NOTE — Progress Notes (Signed)
   Providing Compassionate, Quality Care - Together   Subjective: Patient reports back pain that is primarily in his mid back. He denies pain into his lower extremities.  Objective: Vital signs in last 24 hours: Temp:  [97.3 F (36.3 C)-98.5 F (36.9 C)] 98.5 F (36.9 C) (05/21 0828) Pulse Rate:  [66-85] 76 (05/21 0903) Resp:  [12-18] 15 (05/21 0903) BP: (88-158)/(74-94) 112/81 (05/21 0903) SpO2:  [96 %-100 %] 97 % (05/21 0903)  Intake/Output from previous day: 05/20 0701 - 05/21 0700 In: 540 [P.O.:540] Out: 2520 [Urine:2500; Chest Tube:20] Intake/Output this shift: Total I/O In: 237 [P.O.:237] Out: 150 [Urine:150]  Alert and oriented x 4 PERRLA CN II-XII grossly intact MAE, pain level limits patient's physical exam   Lab Results: Recent Labs    02/09/20 0330  WBC 6.8  HGB 9.2*  HCT 28.8*  PLT 343   BMET Recent Labs    02/09/20 0330  NA 133*  K 4.4  CL 100  CO2 26  GLUCOSE 172*  BUN 13  CREATININE 0.54*  CALCIUM 8.2*    Studies/Results: [place] EKG SITE RITE  Result Date: 02/09/2020 If Site Rite image not attached, placement could not be confirmed due to current cardiac rhythm.   Assessment/Plan: Patient with osteomyelitis discitis at T9-10 and T10-11 with ventral epidural abscess. Cultures grew Strep pneumoniae. ID recommending 6 weeks ceftriaxone IV, followed by oral antibiotics. Possibly SNF at discharge.   LOS: 5 days    -Continue to mobilize -Continue abx -No Neurosurgical intervention recommended at this time   Val Eagle, DNP, AGNP-C Nurse Practitioner  [facility] [address], [place], [place] [address] P: [phone]    F: [phone]  02/11/2020, 11:34 AM

## 2020-02-11 NOTE — [place] FL2 (Signed)
[place] MEDICAID FL2 LEVEL OF CARE SCREENING TOOL     IDENTIFICATION  Patient Name: Brandon [name] Birthdate: Aug 06, 1966 Sex: male Admission Date (Current Location): 02/06/2020  [facility] and [place] Number:  Producer, television/film/video and Address:  [facility], [address], [place], [place] [address]      Provider Number: [id number]  Attending Physician Name and Address:  Marinda Elk, MD  Relative Name and Phone Number:  Elenore Rota    Current Level of Care: Hospital Recommended Level of Care: Skilled Nursing Facility Prior Approval Number:    Date Approved/Denied:   PASRR Number: Pending  Discharge Plan: SNF    Current Diagnoses: Patient Active Problem List   Diagnosis Date Noted  . Empyema lung (HCC)   . Epidural abscess   . Diskitis 02/06/2020  . Alcohol withdrawal delirium (HCC) 02/06/2020  . Epilepsia (HCC) 02/06/2020  . Pleural effusion on right 02/06/2020  . History of stroke 01/23/2017  . Dry eyes   . Sleep disturbance   . Essential hypertension, benign   . Upper GI bleed   . 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  . Fatty liver   . Tobacco abuse   . Diabetes mellitus type 2 in nonobese (HCC)   . History of CVA with residual deficit   . Acute lower UTI   . Tachycardia   . Chronic alcoholic pancreatitis (HCC)   . Hyponatremia 03/09/2016  . 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  . Acute blood loss anemia 03/09/2016  . Gastric varices   . Alcohol abuse   . Left-sided neglect   . Severe anemia   . UGIB (upper gastrointestinal bleed)   . Pressure ulcer 03/06/2016  . Acute encephalopathy   . CVA (cerebral infarction) 03/05/2016  . Carotid stenosis 04/06/2015  . CAD in native artery 03/16/2015  . Unstable angina pectoris (HCC) 03/15/2015  . Neck pain 10/20/2013  . Insomnia 12/29/2012  . 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 Occlusion and stenosis of  carotid artery with cerebral infarction 12/11/2012  . Unspecified cerebral artery occlusion with cerebral infarction 12/11/2012  . Fatigue 11/26/2012  . Hallux valgus 06/25/2012  . Preventative health care 06/25/2012  . Alcohol Dependence 06/18/2012  . Smoking 06/16/2012  . Bilateral extracranial carotid artery stenosis   . Coronary Artery Disease 06/13/2012  . Ischemic Stroke 06/13/2012  . Hyperlipidemia   . Hypertension 02/25/2011  . GERD (gastroesophageal reflux disease) 02/25/2011  . Chronic Pancreatitis. 02/25/2011  . Hepatic steatosis 02/25/2011    Orientation RESPIRATION BLADDER Height & Weight     Self, Time, Situation, Place  Normal Continent, External catheter Weight: 180 lb (81.6 kg) Height:  6' (182.9 cm)  BEHAVIORAL SYMPTOMS/MOOD NEUROLOGICAL BOWEL NUTRITION STATUS      Continent Diet(See discharge summary)  AMBULATORY STATUS COMMUNICATION OF NEEDS Skin   Extensive Assist Verbally Normal                       Personal Care Assistance Level of Assistance  Bathing, Feeding, Dressing Bathing Assistance: Limited assistance Feeding assistance: Independent Dressing Assistance: Maximum assistance     Functional Limitations Info  Sight, Hearing, Speech Sight Info: Adequate   Speech Info: Adequate    SPECIAL CARE FACTORS FREQUENCY  PT (By licensed PT), OT (By licensed OT)     PT Frequency: 5x a week OT Frequency: 5x a week  Contractures Contractures Info: Not present    Additional Factors Info  Code Status, Allergies Code Status Info: Full Allergies Info: NKA           Current Medications (02/11/2020):  This is the current hospital active medication list Current Facility-Administered Medications  Medication Dose Route Frequency Provider Last Rate Last Admin  . acetaminophen (TYLENOL) tablet 650 mg  650 mg Oral Q6H PRN Toy Baker, MD   650 mg at 02/09/20 2013   Or  . acetaminophen (TYLENOL) suppository 650 mg  650 mg Rectal  Q6H PRN Doutova, Anastassia, MD      . albuterol (PROVENTIL) (2.5 MG/3ML) 0.083% nebulizer solution 2.5 mg  2.5 mg Nebulization Q6H PRN Doutova, Anastassia, MD      . busPIRone (BUSPAR) tablet 10 mg  10 mg Oral BID Charlynne Cousins, MD   10 mg at 02/11/20 1041  . cefTRIAXone (ROCEPHIN) 2 g in sodium chloride 0.9 % 100 mL IVPB  2 g Intravenous Q24H Susa Raring, RPH 200 mL/hr at 02/11/20 1354 2 g at 02/11/20 1354  . Chlorhexidine Gluconate Cloth 2 % PADS 6 each  6 each Topical Daily Charlynne Cousins, MD   6 each at 02/11/20 1050  . divalproex (DEPAKOTE ER) 24 hr tablet 500 mg  500 mg Oral Daily Doutova, Anastassia, MD   500 mg at 02/11/20 1046  . docusate sodium (COLACE) capsule 100 mg  100 mg Oral BID Toy Baker, MD   100 mg at 02/11/20 1043  . famotidine (PEPCID) tablet 20 mg  20 mg Oral BID Toy Baker, MD   20 mg at 02/11/20 1044  . feeding supplement (GLUCERNA SHAKE) (GLUCERNA SHAKE) liquid 237 mL  237 mL Oral TID BM [place] Eric J, DO   237 mL at 02/10/20 2103  . feeding supplement (PRO-STAT SUGAR FREE 64) liquid 30 mL  30 mL Oral BID [place] Eric J, DO   30 mL at 02/11/20 1046  . folic acid (FOLVITE) tablet 1 mg  1 mg Oral Daily Doutova, Anastassia, MD   1 mg at 02/11/20 1043  . HYDROcodone-acetaminophen (NORCO/VICODIN) 5-325 MG per tablet 1-2 tablet  1-2 tablet Oral Q4H PRN Toy Baker, MD   2 tablet at 02/11/20 1338  . insulin aspart (novoLOG) injection 0-15 Units  0-15 Units Subcutaneous TID WC Charlynne Cousins, MD   5 Units at 02/11/20 1339  . insulin aspart (novoLOG) injection 0-5 Units  0-5 Units Subcutaneous QHS Charlynne Cousins, MD      . insulin aspart (novoLOG) injection 4 Units  4 Units Subcutaneous TID WC Charlynne Cousins, MD   4 Units at 02/11/20 1337  . insulin detemir (LEVEMIR) injection 30 Units  30 Units Subcutaneous BID Charlynne Cousins, MD   30 Units at 02/11/20 1047  . metoprolol tartrate (LOPRESSOR) tablet 50 mg  50 mg  Oral BID Charlynne Cousins, MD   50 mg at 02/11/20 1044  . morphine 2 MG/ML injection 2 mg  2 mg Intravenous Q2H PRN Charlynne Cousins, MD   2 mg at 02/10/20 0939  . multivitamin with minerals tablet 1 tablet  1 tablet Oral Daily Toy Baker, MD   1 tablet at 02/11/20 1045  . nicotine (NICODERM CQ - dosed in mg/24 hours) patch 21 mg  21 mg Transdermal Daily Doutova, Anastassia, MD   21 mg at 02/11/20 1049  . ondansetron (ZOFRAN) tablet 4 mg  4 mg Oral Q6H PRN Toy Baker, MD  Or  . ondansetron (ZOFRAN) injection 4 mg  4 mg Intravenous Q6H PRN Doutova, Anastassia, MD      . simvastatin (ZOCOR) tablet 20 mg  20 mg Oral q1800 Doutova, Anastassia, MD   20 mg at 02/10/20 1824  . sodium chloride flush (NS) 0.9 % injection 10-40 mL  10-40 mL Intracatheter PRN Marinda Elk, MD      . sodium chloride flush (NS) 0.9 % injection 3 mL  3 mL Intravenous Q12H Doutova, Anastassia, MD   3 mL at 02/11/20 1049  . thiamine tablet 100 mg  100 mg Oral Daily Doutova, Anastassia, MD   100 mg at 02/11/20 1041   Or  . thiamine (B-1) injection 100 mg  100 mg Intravenous Daily Doutova, Anastassia, MD      . traZODone (DESYREL) tablet 150 mg  150 mg Oral QHS Therisa Doyne, MD   150 mg at 02/10/20 2220     Discharge Medications: Please see discharge summary for a list of discharge medications.  Relevant Imaging Results:  Relevant Lab Results:   Additional Information SSN [id number] ; IV Rocephin for 6 weeks  Dannette Barbara Riddick, [place]

## 2020-02-11 NOTE — Progress Notes (Signed)
[facility] PROGRESS NOTE    Progress Note  Brandon [name]  ZOX:[id number] DOB: 31-Jul-1966 DOA: 02/06/2020 PCP: Brandon [name], Brandon [name]     Brief Narrative:   Brandon [name] is an 54 y.o. male past medical history of alcohol abuse, CVA alcoholic cirrhosis, seizure disorder, chronic pancreatitis, uncontrolled diabetes mellitus presents to the hospital with generalized weakness and progressive back pain he [name] that he has been having this back pain for about 3 months progressively getting worst CT scan of the abdomen and pelvis was notable for discitis on T9-T10 and T10 and 1011 with fluid collection noted posteriorly in the right pleural space, MRI of the T-spine confirmed discitis and osteomyelitis with an epidural abscess measuring about 5 mm causing moderate T9-T10 and severe T9 T11 spinal stenosis, large amounts of paravertebral phlegmon at T9 T11 level and empyema of the right posterior chest neurosurgery was consulted for evaluation  Assessment/Plan:   Right pleural effusion: IR was consulted and CT-guided drain placement, CT surgery consulted they recommended no surgical intervention continue treatment with drain and IV empiric antibiotics. Fluid culture grew strep pneumo sensitive to Rocephin and penicillin. Blood cultures remain negative till date. Physical therapy evaluated the patient recommended skilled nursing facility.  Thoracic spine discitis, osteomyelitis with abscess: Surgery was consulted as well as ID recommended to continue IV Rocephin for 6 weeks. PICC line placed on 02/10/2020. Blood cultures remain negative till date. Patient pain is controlled, physical therapy evaluated him and recommended skilled nursing facility. He will probably be stable for discharge on 02/13/2020.  Hypomagnesemia: Repleted, continue to monitor intermittently.  Alcohol dependence/abuse: He endorses withdrawal symptoms in the past. Discontinue Ativan protocol continue thiamine  and folate.  Alcoholic cirrhosis: He continues to drink see above for further details.  Chronic pancreatitis: Incidental finding on MRI of the lumbar spine that showed a hypodensity in the pancreas head, will need MRI of the pancreas as an outpatient for surveillance.  Essential hypertension: Metoprolol was resumed, his tachycardia resolved, blood pressure is adequate.  Diabetes mellitus type 2: With an A1c of 7.1, his Metformin was discontinued, will double the long-acting insulin continue sliding scale.  Blood glucose was started up to 400.  Hyperlipidemia: Excellent continue statins.  Seizure disorder: Continue Depakote.  Nicotine use: Continue nicotine patch.   DVT prophylaxis: lovenox Family Communication:none Status is: Inpatient  Remains inpatient appropriate because:Hemodynamically unstable   Dispo: The patient is from: Home              Anticipated d/c is to: Home              Anticipated d/c date is: > 3 days              Patient currently is not medically stable to d/c.  Code Status:     Code Status Orders  (From admission, onward)         Start     Ordered   02/06/20 2130  Full code  Continuous     02/06/20 2129        Code Status History    Date Active Date Inactive Code Status Order ID Comments User Context   03/12/2016 1817 03/29/2016 1546 Full Code [id number]  Lynnae Prudengiulli, Brandon [name] Inpatient   03/05/2016 2146 03/12/2016 1817 Full Code [id number]  Coralie KeensArrien, Brandon [name] ED   04/06/2015 1030 04/07/2015 1325 Full Code [id number]  Annice Needyew, Brandon [name] Inpatient   03/16/2015 1433 03/17/2015 1321 Full Code [id number]  Adrian BlackwaterKhan, Brandon  A, [name] Inpatient   03/15/2015 1947 03/16/2015 1433 Full Code [id number]  Dionisio David, [name] Inpatient   Advance Care Planning Activity    Advance Directive Documentation     Most Recent Value  Type of Advance Directive  Healthcare Power of Attorney  Pre-existing out of facility DNR order (yellow form or pink MOST form)  --   "MOST" Form in Place?  --        IV Access:    Peripheral IV   Procedures and diagnostic studies:   [place] EKG SITE RITE  Result Date: 02/09/2020 If Site Rite image not attached, placement could not be confirmed due to current cardiac rhythm.    Medical Consultants:    None.  Anti-Infectives:   vanc and rocephin  Subjective:    Brandon [name] his pain is controlled.  Objective:    Vitals:   02/11/20 0000 02/11/20 0038 02/11/20 0400 02/11/20 0455  BP: 127/76 127/76 (!) 143/92 (!) 143/93  Pulse: 71 71 66 76  Resp:  14  12  Temp:  97.8 F (36.6 C)  97.8 F (36.6 C)  TempSrc:  Oral  Oral  SpO2:  98%  100%  Weight:      Height:       SpO2: 100 % O2 Flow Rate (L/min): 3 L/min   Intake/Output Summary (Last 24 hours) at 02/11/2020 0812 Last data filed at 02/11/2020 0752 Gross per 24 hour  Intake 540 ml  Output 2520 ml  Net -1980 ml   Filed Weights   02/06/20 2013  Weight: 81.6 kg    Exam: General exam: In no acute distress. Respiratory system: Good air movement and clear to auscultation, with pigtail catheter in place.. Cardiovascular system: S1 & S2 heard, RRR. No JVD. Gastrointestinal system: Abdomen is nondistended, soft and nontender.  Central nervous system: Alert and oriented. No focal neurological deficits. Extremities: No pedal edema. Skin: No rashes, lesions or ulcers Psychiatry: Judgement and insight appear normal. Mood & affect appropriate.  Data Reviewed:    Labs: Basic Metabolic Panel: Recent Labs  Lab 02/06/20 1206 02/06/20 1206 02/07/20 0029 02/07/20 0029 02/08/20 0606 02/09/20 0330  NA 137  --  134*  --  135 133*  K 3.6   < > 3.8   < > 4.2 4.4  CL 100  --  100  --  101 100  CO2 23  --  25  --  25 26  GLUCOSE 173*  --  234*  --  179* 172*  BUN 10  --  12  --  13 13  CREATININE 0.49*  --  0.54*  --  0.58* 0.54*  CALCIUM 8.7*  --  8.7*  --  8.7* 8.2*  MG  --   --  1.3*  --  1.5* 2.1  PHOS  --   --  2.9  --    --   --    < > = values in this interval not displayed.   GFR Estimated Creatinine Clearance: 114.5 mL/min (A) (by C-G formula based on SCr of 0.54 mg/dL (L)). Liver Function Tests: Recent Labs  Lab 02/06/20 1206 02/07/20 0029  AST 13* 13*  ALT 12 11  ALKPHOS 123 113  BILITOT 0.3 0.6  PROT 8.0 7.2  ALBUMIN 2.6* 2.4*   Recent Labs  Lab 02/06/20 1206  LIPASE 84*   Recent Labs  Lab 02/07/20 0029  AMMONIA 22   Coagulation profile Recent Labs  Lab 02/07/20 0029  INR  1.1   COVID-19 Labs  No results for input(s): DDIMER, FERRITIN, LDH, CRP in the last 72 hours.  Lab Results  Component Value Date   SARSCOV2NAA NEGATIVE 02/06/2020    CBC: Recent Labs  Lab 02/06/20 1206 02/07/20 0029 02/08/20 0606 02/09/20 0330  WBC 9.5 8.2 9.2 6.8  NEUTROABS  --  6.2  --   --   HGB 11.4* 9.9* 10.6* 9.2*  HCT 35.7* 30.7* 33.6* 28.8*  MCV 97.3 95.6 98.8 98.0  PLT 499* 407* 407* 343   Cardiac Enzymes: No results for input(s): CKTOTAL, CKMB, CKMBINDEX, TROPONINI in the last 168 hours. BNP (last 3 results) No results for input(s): PROBNP in the last 8760 hours. CBG: Recent Labs  Lab 02/10/20 0853 02/10/20 1132 02/10/20 1823 02/10/20 2044 02/11/20 0037  GLUCAP 106* 189* 137* 164* 371*   D-Dimer: No results for input(s): DDIMER in the last 72 hours. Hgb A1c: Recent Labs    02/08/20 1328  HGBA1C 7.1*   Lipid Profile: No results for input(s): CHOL, HDL, LDLCALC, TRIG, CHOLHDL, LDLDIRECT in the last 72 hours. Thyroid function studies: No results for input(s): TSH, T4TOTAL, T3FREE, THYROIDAB in the last 72 hours.  Invalid input(s): FREET3 Anemia work up: No results for input(s): VITAMINB12, FOLATE, FERRITIN, TIBC, IRON, RETICCTPCT in the last 72 hours. Sepsis Labs: Recent Labs  Lab 02/06/20 1206 02/07/20 0029 02/08/20 0606 02/09/20 0330  WBC 9.5 8.2 9.2 6.8   Microbiology Recent Results (from the past 240 hour(s))  Urine culture     Status: None    Collection Time: 02/06/20  1:24 PM   Specimen: Urine, Random  Result Value Ref Range Status   Specimen Description URINE, RANDOM  Final   Special Requests NONE  Final   Culture   Final    NO GROWTH Performed at [facility], [address] [place], [place] [address]    Report Status 02/07/2020 FINAL  Final  Culture, blood (routine x 2)     Status: None   Collection Time: 02/06/20  5:05 PM   Specimen: BLOOD  Result Value Ref Range Status   Specimen Description BLOOD RIGHT ANTECUBITAL  Final   Special Requests   Final    BOTTLES DRAWN AEROBIC AND ANAEROBIC Blood Culture adequate volume   Culture   Final    NO GROWTH 5 DAYS Performed at [facility], [address], [place], [place] [address]    Report Status 02/11/2020 FINAL  Final  SARS Coronavirus 2 by RT PCR (hospital order, performed in [facility] hospital lab) Nasopharyngeal Nasopharyngeal Swab     Status: None   Collection Time: 02/06/20  6:13 PM   Specimen: Nasopharyngeal Swab  Result Value Ref Range Status   SARS Coronavirus 2 NEGATIVE NEGATIVE Final    Comment: (NOTE) SARS-CoV-2 target nucleic acids are NOT DETECTED. The SARS-CoV-2 RNA is generally detectable in upper and lower respiratory specimens during the acute phase of infection. The lowest concentration of SARS-CoV-2 viral copies this assay can detect is 250 copies / mL. A negative result does not preclude SARS-CoV-2 infection and should not be used as the sole basis for treatment or other patient management decisions.  A negative result may occur with improper specimen collection / handling, submission of specimen other than nasopharyngeal swab, presence of viral mutation(s) within the areas targeted by this assay, and inadequate number of viral copies (<250 copies / mL). A negative result must be combined with clinical observations, patient history, and epidemiological information. Fact  Sheet for Patients:    BoilerBrush.com.cy Fact Sheet for Healthcare Providers: https://pope.com/ This test is not yet approved or cleared  by the [place] FDA and has been authorized for detection and/or diagnosis of SARS-CoV-2 by FDA under an Emergency Use Authorization (EUA).  This EUA will remain in effect (meaning this test can be used) for the duration of the COVID-19 declaration under Section 564(b)(1) of the Act, 21 U.S.C. section 360bbb-3(b)(1), unless the authorization is terminated or revoked sooner. Performed at [facility], [address], [place], [place] [address]   Culture, blood (routine x 2)     Status: None   Collection Time: 02/06/20  8:40 PM   Specimen: BLOOD  Result Value Ref Range Status   Specimen Description BLOOD RIGHT ANTECUBITAL  Final   Special Requests   Final    BOTTLES DRAWN AEROBIC AND ANAEROBIC Blood Culture adequate volume   Culture   Final    NO GROWTH 5 DAYS Performed at [facility], [address], [place], [place] [address]    Report Status 02/11/2020 FINAL  Final  MRSA PCR Screening     Status: None   Collection Time: 02/06/20 11:22 PM   Specimen: Nasal Mucosa; Nasopharyngeal  Result Value Ref Range Status   MRSA by PCR NEGATIVE NEGATIVE Final    Comment:        The GeneXpert MRSA Assay (FDA approved for NASAL specimens only), is one component of a comprehensive MRSA colonization surveillance program. It is not intended to diagnose MRSA infection nor to guide or monitor treatment for MRSA infections. Performed at [facility], [address] [place], [place] [address]   Aerobic/Anaerobic Culture (surgical/deep wound)     Status: None (Preliminary result)   Collection Time: 02/08/20  9:56 AM   Specimen: Abscess  Result Value Ref Range Status   Specimen Description ABSCESS RIGHT PLEURAL  Final   Special Requests NONE  Final   Gram Stain   Final    ABUNDANT WBC PRESENT,  PREDOMINANTLY PMN ABUNDANT GRAM POSITIVE COCCI IN PAIRS IN CHAINS Performed at [facility], [address], [place], [place] [address]    Culture   Final    FEW STREPTOCOCCUS PNEUMONIAE NO ANAEROBES ISOLATED; CULTURE IN PROGRESS FOR 5 DAYS    Report Status PENDING  Incomplete   Organism ID, Bacteria STREPTOCOCCUS PNEUMONIAE  Final      Susceptibility   Streptococcus pneumoniae - MIC*    ERYTHROMYCIN 2 RESISTANT Resistant     LEVOFLOXACIN <=0.25 SENSITIVE Sensitive     VANCOMYCIN 0.5 SENSITIVE Sensitive     PENO - penicillin 1      PENICILLIN (non-meningitis) 1 SENSITIVE Sensitive     PENICILLIN (oral) 1 INTERMEDIATE Intermediate     CEFTRIAXONE (non-meningitis) 1 SENSITIVE Sensitive     * FEW STREPTOCOCCUS PNEUMONIAE     Medications:   . busPIRone  10 mg Oral BID  . Chlorhexidine Gluconate Cloth  6 each Topical Daily  . divalproex  500 mg Oral Daily  . docusate sodium  100 mg Oral BID  . famotidine  20 mg Oral BID  . feeding supplement (GLUCERNA SHAKE)  237 mL Oral TID BM  . feeding supplement (PRO-STAT SUGAR FREE 64)  30 mL Oral BID  . folic acid  1 mg Oral Daily  . insulin aspart  0-15 Units Subcutaneous TID WC  . insulin aspart  0-5 Units Subcutaneous QHS  . insulin aspart  4 Units  Subcutaneous TID WC  . insulin detemir  10 Units Subcutaneous BID  . metoprolol tartrate  50 mg Oral BID  . multivitamin with minerals  1 tablet Oral Daily  . nicotine  21 mg Transdermal Daily  . polyethylene glycol  17 g Oral BID  . simvastatin  20 mg Oral q1800  . sodium chloride flush  3 mL Intravenous Q12H  . thiamine  100 mg Oral Daily   Or  . thiamine  100 mg Intravenous Daily  . traZODone  150 mg Oral QHS   Continuous Infusions: . cefTRIAXone (ROCEPHIN)  IV 2 g (02/10/20 1406)      LOS: 5 days   Marinda Elk  [facility]  02/11/2020, 8:12 AM

## 2020-02-11 NOTE — Telephone Encounter (Signed)
Front desk scheduled patient 6/17, 3:45. Andree Coss, RN

## 2020-02-11 NOTE — Progress Notes (Signed)
Referring Physician(s): [place], Alvira Philips  Supervising Physician: Gilmer Mor  Patient Status:  [facility] - In-pt  Chief Complaint: None  Subjective:  Right empyema s/p right chest tube placement in IR 02/08/2020 by Dr. Loreta Ave. Patient awake and alert sitting in bed with no complaints at this time. Accompanied by sister at bedside. Right chest tube site c/d/i. Sating 98% on RA.   Allergies: No known allergies  Medications: Prior to Admission medications   Medication Sig Start Date End Date Taking? Authorizing Provider  busPIRone (BUSPAR) 10 MG tablet Take 10 mg by mouth 2 (two) times daily. 12/28/19  Yes [provider]  clopidogrel (PLAVIX) 75 MG tablet Take 75 mg by mouth daily.    Yes [provider]  cyclobenzaprine (FLEXERIL) 10 MG tablet Take 10 mg by mouth every 12 (twelve) hours. 01/08/17  Yes [provider]  famotidine (PEPCID) 20 MG tablet Take 20 mg by mouth 2 (two) times daily.  01/08/17  Yes [provider]  lisinopril (PRINIVIL,ZESTRIL) 10 MG tablet Take 10 mg by mouth daily.  01/12/17  Yes [provider]  metFORMIN (GLUCOPHAGE) 1000 MG tablet Take 1 tablet (1,000 mg total) by mouth 2 (two) times daily with a meal. 03/29/16  Yes Angiulli, Mcarthur Rossetti, PA-C  metoprolol tartrate (LOPRESSOR) 100 MG tablet Take 100 mg by mouth daily.   Yes [provider]  simvastatin (ZOCOR) 20 MG tablet Take 20 mg by mouth daily at 6 PM.    Yes [provider]     Vital Signs: BP (!) 142/79 (BP Location: Left Arm)   Pulse 69   Temp 98.9 F (37.2 C) (Oral)   Resp 15   Ht 6' (1.829 m)   Wt 180 lb (81.6 kg)   SpO2 97%   BMI 24.41 kg/m   Physical Exam Vitals and nursing note reviewed.  Constitutional:      General: He is not in acute distress.    Appearance: Normal appearance.  Pulmonary:     Effort: Pulmonary effort is normal. No respiratory distress.     Comments: Right chest tube site with mild tenderness, no  erythema, drainage, or active bleeding; approximately 70 cc purulent drainage total in pleure-vac (approximately 10 cc output in past 24 hours); tube to suction with (-) air leak. Skin:    General: Skin is warm and dry.  Neurological:     Mental Status: He is alert and oriented to person, place, and time.     Imaging: CT IMAGE GUIDED FLUID DRAIN BY CATHETER  Result Date: 02/08/2020 INDICATION: 54 year old male with a history right posterior chest empyema and associated osteomyelitis discitis. He has been referred for chest tube placement. EXAM: CT-GUIDED CHEST TUBE PLACEMENT, RIGHT EMPYEMA MEDICATIONS: The patient is currently admitted to the hospital and receiving intravenous antibiotics. The antibiotics were administered within an appropriate time frame prior to the initiation of the procedure. ANESTHESIA/SEDATION: Fentanyl 50 mcg IV; Versed 0 mg IV Moderate Sedation Time:  None The patient was continuously monitored during the procedure by the interventional radiology nurse under my direct supervision. COMPLICATIONS: None PROCEDURE: Informed written consent was obtained from the patient after a thorough discussion of the procedural risks, benefits and alternatives. All questions were addressed. Maximal Sterile Barrier Technique was utilized including caps, mask, sterile gowns, sterile gloves, sterile drape, hand hygiene and skin antiseptic. A timeout was performed prior to the initiation of the procedure. Patient was positioned left decubitus position on the CT gantry table. Scout CT was  acquired for planning purposes. After initial CT was performed the patient is prepped and draped in the usual sterile fashion. 1% lidocaine was used for local anesthesia. Yueh catheter was placed into the empyema with aspirate performed for culture. Modified Seldinger technique was then used to place a 12 [place] drain. Drain was attached to evacuation chamber. Drain was sutured in position. Final image was stored.  Patient tolerated the procedure well and remained hemodynamically stable throughout. No complications were encountered and no significant blood loss. IMPRESSION: Status post CT-guided drain placement into right-sided empyema. Frank pus was aspirated and a culture was sent. Signed, Dulcy Fanny. Dellia Nims, RPVI [facility] Electronically Signed   By: Corrie Mckusick D.O.   On: 02/08/2020 11:19   [place] EKG SITE RITE  Result Date: 02/09/2020 If Site Rite image not attached, placement could not be confirmed due to current cardiac rhythm.   Labs:  CBC: Recent Labs    02/06/20 1206 02/07/20 0029 02/08/20 0606 02/09/20 0330  WBC 9.5 8.2 9.2 6.8  HGB 11.4* 9.9* 10.6* 9.2*  HCT 35.7* 30.7* 33.6* 28.8*  PLT 499* 407* 407* 343    COAGS: Recent Labs    02/07/20 0029  INR 1.1    BMP: Recent Labs    02/07/20 0029 02/08/20 0606 02/09/20 0330 02/11/20 1128  NA 134* 135 133* 130*  K 3.8 4.2 4.4 4.6  CL 100 101 100 96*  CO2 25 25 26 27   GLUCOSE 234* 179* 172* 238*  BUN 12 13 13 16   CALCIUM 8.7* 8.7* 8.2* 8.9  CREATININE 0.54* 0.58* 0.54* 0.47*  GFRNONAA >60 >60 >60 >60  GFRAA >60 >60 >60 >60    LIVER FUNCTION TESTS: Recent Labs    02/06/20 1206 02/07/20 0029  BILITOT 0.3 0.6  AST 13* 13*  ALT 12 11  ALKPHOS 123 113  PROT 8.0 7.2  ALBUMIN 2.6* 2.4*    Assessment and Plan:  Right empyema s/p right chest tube placement in IR 02/08/2020 by Dr. Earleen Newport. Right chest tube stable with approximately 70 cc purulent drainage total in pleure-vac, approximately 10 cc output in past 24 hours. Discussed case with Dr. Earleen Newport who recommends removal of right chest tube at this time given low output, no post-removal imaging needed. Right chest tube was removed intact, no complications, patient tolerated well, EBL = 0 mL, dressing applied to site (pertrolium gauze + cloth gauze + tegaderm). Further plans per TRH/ID/neurosurgery- appreciate  and agree with management. Please call IR with questions/concerns.   Electronically Signed: Earley Abide, PA-C 02/11/2020, 3:35 PM   I spent a total of 35 Minutes at the the patient's bedside AND on the patient's hospital floor or unit, greater than 50% of which was counseling/coordinating care for right empyema s/p right chest tube placement.

## 2020-02-11 NOTE — Telephone Encounter (Signed)
-----   Message from Judyann Munson, MD sent at 02/11/2020  8:23 AM EDT ----- Can you make appt for [place] to see him in 5 wk

## 2020-02-12 LAB — GLUCOSE, CAPILLARY
Glucose-Capillary: 277 mg/dL — ABNORMAL HIGH (ref 70–99)
Glucose-Capillary: 256 mg/dL — ABNORMAL HIGH (ref 70–99)
Glucose-Capillary: 206 mg/dL — ABNORMAL HIGH (ref 70–99)
Glucose-Capillary: 248 mg/dL — ABNORMAL HIGH (ref 70–99)
Glucose-Capillary: 223 mg/dL — ABNORMAL HIGH (ref 70–99)
Glucose-Capillary: 197 mg/dL — ABNORMAL HIGH (ref 70–99)
Glucose-Capillary: 51 mg/dL — ABNORMAL LOW (ref 70–99)
Glucose-Capillary: 97 mg/dL (ref 70–99)
Glucose-Capillary: 182 mg/dL — ABNORMAL HIGH (ref 70–99)
Glucose-Capillary: 262 mg/dL — ABNORMAL HIGH (ref 70–99)

## 2020-02-12 LAB — BASIC METABOLIC PANEL
Anion gap: 6 (ref 5–15)
BUN: 17 mg/dL (ref 6–20)
CO2: 29 mmol/L (ref 22–32)
Calcium: 9 mg/dL (ref 8.9–10.3)
Chloride: 100 mmol/L (ref 98–111)
Creatinine, Ser: 0.5 mg/dL — ABNORMAL LOW (ref 0.61–1.24)
GFR calc Af Amer: >60 mL/min (ref >60)
GFR calc non Af Amer: >60 mL/min (ref >60)
Glucose, Bld: 64 mg/dL — ABNORMAL LOW (ref 70–99)
Potassium: 4.4 mmol/L (ref 3.5–5.1)
Sodium: 135 mmol/L (ref 135–145)

## 2020-02-12 MED ORDER — INSULIN DETEMIR 100 UNIT/ML ~~LOC~~ SOLN
20.0000 [IU] | Freq: Two times a day (BID) | SUBCUTANEOUS | Status: DC
  Administered 2020-02-12 – 2020-02-18 (×10): 20 [IU] via SUBCUTANEOUS
  Filled 2020-02-12 (×13): qty 0.2

## 2020-02-12 NOTE — Progress Notes (Signed)
[facility] PROGRESS NOTE    Progress Note  Brandon [name]  WUJ:[id number] DOB: [birth year] DOA: 02/06/2020 PCP: Armando Gang, FNP     Brief Narrative:   Brandon [name] is an 54 y.o. male past medical history of alcohol abuse, CVA alcoholic cirrhosis, seizure disorder, chronic pancreatitis, uncontrolled diabetes mellitus presents to the hospital with generalized weakness and progressive back pain he relates that he has been having this back pain for about 3 months progressively getting worst CT scan of the abdomen and pelvis was notable for discitis on T9-T10 and T10 and 1011 with fluid collection noted posteriorly in the right pleural space, MRI of the T-spine confirmed discitis and osteomyelitis with an epidural abscess measuring about 5 mm causing moderate T9-T10 and severe T9 T11 spinal stenosis, large amounts of paravertebral phlegmon at T9 T11 level and empyema of the right posterior chest neurosurgery was consulted for evaluation  Assessment/Plan:   Right pleural effusion: Post right chest removal imaging showed no pneumothorax. Fluid culture grew strep pneumo sensitive to Rocephin and penicillin. Ultrasound remained negative till date, continue IV Rocephin. Patient is awaiting skilled nursing facility placement.  Thoracic spine discitis, osteomyelitis with abscess: Neurosurgery and infectious disease are on board and appreciate assistance, probably strep pneumo discitis, continue IV Rocephin for 6 weeks. PICC line placed on 02/10/2020. Physical therapy evaluated the patient recommended skilled nursing facility, probably for skilled on 02/14/2020.  Hypomagnesemia: Repleted, continue to monitor intermittently.  Alcohol dependence/abuse: He endorses withdrawal symptoms in the past. Discontinue Ativan protocol continue thiamine and folate.  Alcoholic cirrhosis: He continues to drink see above for further details.  Chronic pancreatitis: Incidental finding on MRI of  the lumbar spine that showed a hypodensity in the pancreas head, will need MRI of the pancreas as an outpatient for surveillance.  Essential hypertension: Metoprolol was resumed, his tachycardia resolved, blood pressure is adequate.  Diabetes mellitus type 2: With an A1c of 7.1, his Metformin was discontinued, will double the long-acting insulin continue sliding scale.  Blood glucose was started up to 400.  Hyperlipidemia: Excellent continue statins.  Seizure disorder: Continue Depakote.  Nicotine use: Continue nicotine patch.   DVT prophylaxis: lovenox Family Communication:none Status is: Inpatient  Remains inpatient appropriate because:Hemodynamically unstable   Dispo: The patient is from: Home              Anticipated d/c is to: Skilled nursing facility placement              Anticipated d/c date is: 2 days              Patient currently is medically stable to d/c.  Patient is currently stable for discharge will need 6 weeks of IV antibiotics  Code Status:     Code Status Orders  (From admission, onward)         Start     Ordered   02/06/20 2130  Full code  Continuous     02/06/20 2129        Code Status History    Date Active Date Inactive Code Status Order ID Comments User Context   03/12/2016 1817 03/29/2016 1546 Full Code [id number]  Lynnae Prude Inpatient   03/05/2016 2146 03/12/2016 1817 Full Code [id number]  Coralie Keens, MD ED   04/06/2015 1030 04/07/2015 1325 Full Code [id number]  Annice Needy, MD Inpatient   03/16/2015 1433 03/17/2015 1321 Full Code [id number]  Laurier Nancy, MD Inpatient   03/15/2015 1947 03/16/2015 1433  Full Code [id number]  Dionisio David, MD Inpatient   Advance Care Planning Activity    Advance Directive Documentation     Most Recent Value  Type of Advance Directive  Healthcare Power of Attorney  Pre-existing out of facility DNR order (yellow form or pink MOST form)  --  "MOST" Form in Place?  --        IV  Access:    Peripheral IV   Procedures and diagnostic studies:   No results found.   Medical Consultants:    None.  Anti-Infectives:   vanc and rocephin  Subjective:    Brandon [name] pain is controlled no complaints.  Objective:    Vitals:   02/11/20 2053 02/11/20 2137 02/12/20 0035 02/12/20 0402  BP: 121/78 121/78 114/75 107/71  Pulse: 74 76 70 67  Resp: 16     Temp: 97.8 F (36.6 C)  97.8 F (36.6 C) 98.4 F (36.9 C)  TempSrc: Oral  Oral Oral  SpO2: 100%  99% 98%  Weight:      Height:       SpO2: 98 % O2 Flow Rate (L/min): 3 L/min   Intake/Output Summary (Last 24 hours) at 02/12/2020 0729 Last data filed at 02/12/2020 0500 Gross per 24 hour  Intake 1197 ml  Output 775 ml  Net 422 ml   Filed Weights   02/06/20 2013  Weight: 81.6 kg    Exam: General exam: In no acute distress. Respiratory system: Good air movement and clear to auscultation, chest tube is out. Cardiovascular system: S1 & S2 heard, RRR. No JVD. Gastrointestinal system: Abdomen is nondistended, soft and nontender.  Extremities: No pedal edema. Skin: No rashes, lesions or ulcers  Data Reviewed:    Labs: Basic Metabolic Panel: Recent Labs  Lab 02/07/20 0029 02/07/20 0029 02/08/20 0606 02/08/20 0606 02/09/20 0330 02/09/20 0330 02/11/20 1128 02/12/20 0410  NA 134*  --  135  --  133*  --  130* 135  K 3.8   < > 4.2   < > 4.4   < > 4.6 4.4  CL 100  --  101  --  100  --  96* 100  CO2 25  --  25  --  26  --  27 29  GLUCOSE 234*  --  179*  --  172*  --  238* 64*  BUN 12  --  13  --  13  --  16 17  CREATININE 0.54*  --  0.58*  --  0.54*  --  0.47* 0.50*  CALCIUM 8.7*  --  8.7*  --  8.2*  --  8.9 9.0  MG 1.3*  --  1.5*  --  2.1  --  1.8  --   PHOS 2.9  --   --   --   --   --   --   --    < > = values in this interval not displayed.   GFR Estimated Creatinine Clearance: 114.5 mL/min (A) (by C-G formula based on SCr of 0.5 mg/dL (L)). Liver Function Tests: Recent Labs   Lab 02/06/20 1206 02/07/20 0029  AST 13* 13*  ALT 12 11  ALKPHOS 123 113  BILITOT 0.3 0.6  PROT 8.0 7.2  ALBUMIN 2.6* 2.4*   Recent Labs  Lab 02/06/20 1206  LIPASE 84*   Recent Labs  Lab 02/07/20 0029  AMMONIA 22   Coagulation profile Recent Labs  Lab 02/07/20 0029  INR 1.1   COVID-19  Labs  No results for input(s): DDIMER, FERRITIN, LDH, CRP in the last 72 hours.  Lab Results  Component Value Date   SARSCOV2NAA NEGATIVE 02/06/2020    CBC: Recent Labs  Lab 02/06/20 1206 02/07/20 0029 02/08/20 0606 02/09/20 0330  WBC 9.5 8.2 9.2 6.8  NEUTROABS  --  6.2  --   --   HGB 11.4* 9.9* 10.6* 9.2*  HCT 35.7* 30.7* 33.6* 28.8*  MCV 97.3 95.6 98.8 98.0  PLT 499* 407* 407* 343   Cardiac Enzymes: No results for input(s): CKTOTAL, CKMB, CKMBINDEX, TROPONINI in the last 168 hours. BNP (last 3 results) No results for input(s): PROBNP in the last 8760 hours. CBG: Recent Labs  Lab 02/10/20 1132 02/10/20 1823 02/10/20 2044 02/11/20 0037 02/11/20 1706  GLUCAP 189* 137* 164* 371* 122*   D-Dimer: No results for input(s): DDIMER in the last 72 hours. Hgb A1c: No results for input(s): HGBA1C in the last 72 hours. Lipid Profile: No results for input(s): CHOL, HDL, LDLCALC, TRIG, CHOLHDL, LDLDIRECT in the last 72 hours. Thyroid function studies: No results for input(s): TSH, T4TOTAL, T3FREE, THYROIDAB in the last 72 hours.  Invalid input(s): FREET3 Anemia work up: No results for input(s): VITAMINB12, FOLATE, FERRITIN, TIBC, IRON, RETICCTPCT in the last 72 hours. Sepsis Labs: Recent Labs  Lab 02/06/20 1206 02/07/20 0029 02/08/20 0606 02/09/20 0330  WBC 9.5 8.2 9.2 6.8   Microbiology Recent Results (from the past 240 hour(s))  Urine culture     Status: None   Collection Time: 02/06/20  1:24 PM   Specimen: Urine, Random  Result Value Ref Range Status   Specimen Description URINE, RANDOM  Final   Special Requests NONE  Final   Culture   Final    NO  GROWTH Performed at [facility], [address] [place], [place] [address]    Report Status 02/07/2020 FINAL  Final  Culture, blood (routine x 2)     Status: None   Collection Time: 02/06/20  5:05 PM   Specimen: BLOOD  Result Value Ref Range Status   Specimen Description BLOOD RIGHT ANTECUBITAL  Final   Special Requests   Final    BOTTLES DRAWN AEROBIC AND ANAEROBIC Blood Culture adequate volume   Culture   Final    NO GROWTH 5 DAYS Performed at [facility], [address] [place], [place] [address]    Report Status 02/11/2020 FINAL  Final  SARS Coronavirus 2 by RT PCR (hospital order, performed in [facility] hospital lab) Nasopharyngeal Nasopharyngeal Swab     Status: None   Collection Time: 02/06/20  6:13 PM   Specimen: Nasopharyngeal Swab  Result Value Ref Range Status   SARS Coronavirus 2 NEGATIVE NEGATIVE Final    Comment: (NOTE) SARS-CoV-2 target nucleic acids are NOT DETECTED. The SARS-CoV-2 RNA is generally detectable in upper and lower respiratory specimens during the acute phase of infection. The lowest concentration of SARS-CoV-2 viral copies this assay can detect is 250 copies / mL. A negative result does not preclude SARS-CoV-2 infection and should not be used as the sole basis for treatment or other patient management decisions.  A negative result may occur with improper specimen collection / handling, submission of specimen other than nasopharyngeal swab, presence of viral mutation(s) within the areas targeted by this assay, and inadequate number of viral copies (<250 copies / mL). A negative result must be combined with clinical observations, patient history, and epidemiological information. Fact Sheet for Patients:   BoilerBrush.com.cyhttps://www.fda.gov/media/136312/download  Fact Sheet for Healthcare Providers: https://pope.com/ This test is not yet approved or cleared  by the [place] FDA and has been authorized for detection  and/or diagnosis of SARS-CoV-2 by FDA under an Emergency Use Authorization (EUA).  This EUA will remain in effect (meaning this test can be used) for the duration of the COVID-19 declaration under Section 564(b)(1) of the Act, 21 U.S.C. section 360bbb-3(b)(1), unless the authorization is terminated or revoked sooner. Performed at [facility], [address], [place], [place] [address]   Culture, blood (routine x 2)     Status: None   Collection Time: 02/06/20  8:40 PM   Specimen: BLOOD  Result Value Ref Range Status   Specimen Description BLOOD RIGHT ANTECUBITAL  Final   Special Requests   Final    BOTTLES DRAWN AEROBIC AND ANAEROBIC Blood Culture adequate volume   Culture   Final    NO GROWTH 5 DAYS Performed at [facility], [address] [place], [place] [address]    Report Status 02/11/2020 FINAL  Final  MRSA PCR Screening     Status: None   Collection Time: 02/06/20 11:22 PM   Specimen: Nasal Mucosa; Nasopharyngeal  Result Value Ref Range Status   MRSA by PCR NEGATIVE NEGATIVE Final    Comment:        The GeneXpert MRSA Assay (FDA approved for NASAL specimens only), is one component of a comprehensive MRSA colonization surveillance program. It is not intended to diagnose MRSA infection nor to guide or monitor treatment for MRSA infections. Performed at [facility], [address] [place], [place] [address]   Aerobic/Anaerobic Culture (surgical/deep wound)     Status: None (Preliminary result)   Collection Time: 02/08/20  9:56 AM   Specimen: Abscess  Result Value Ref Range Status   Specimen Description ABSCESS RIGHT PLEURAL  Final   Special Requests NONE  Final   Gram Stain   Final    ABUNDANT WBC PRESENT, PREDOMINANTLY PMN ABUNDANT GRAM POSITIVE COCCI IN PAIRS IN CHAINS Performed at [facility], [address] [place], [place] [address]    Culture   Final    FEW STREPTOCOCCUS PNEUMONIAE NO ANAEROBES ISOLATED; CULTURE IN PROGRESS  FOR 5 DAYS    Report Status PENDING  Incomplete   Organism ID, Bacteria STREPTOCOCCUS PNEUMONIAE  Final      Susceptibility   Streptococcus pneumoniae - MIC*    ERYTHROMYCIN 2 RESISTANT Resistant     LEVOFLOXACIN <=0.25 SENSITIVE Sensitive     VANCOMYCIN 0.5 SENSITIVE Sensitive     PENO - penicillin 1      PENICILLIN (non-meningitis) 1 SENSITIVE Sensitive     PENICILLIN (oral) 1 INTERMEDIATE Intermediate     CEFTRIAXONE (non-meningitis) 1 SENSITIVE Sensitive     * FEW STREPTOCOCCUS PNEUMONIAE     Medications:   . busPIRone  10 mg Oral BID  . Chlorhexidine Gluconate Cloth  6 each Topical Daily  . divalproex  500 mg Oral Daily  . docusate sodium  100 mg Oral BID  . famotidine  20 mg Oral BID  . feeding supplement (GLUCERNA SHAKE)  237 mL Oral TID BM  . feeding supplement (PRO-STAT SUGAR FREE 64)  30 mL Oral BID  . folic acid  1 mg Oral Daily  . insulin aspart  0-15 Units Subcutaneous TID WC  . insulin aspart  0-5 Units Subcutaneous QHS  . insulin aspart  4 Units Subcutaneous TID WC  . insulin  detemir  30 Units Subcutaneous BID  . metoprolol tartrate  50 mg Oral BID  . multivitamin with minerals  1 tablet Oral Daily  . nicotine  21 mg Transdermal Daily  . simvastatin  20 mg Oral q1800  . sodium chloride flush  3 mL Intravenous Q12H  . thiamine  100 mg Oral Daily   Or  . thiamine  100 mg Intravenous Daily  . traZODone  150 mg Oral QHS   Continuous Infusions: . cefTRIAXone (ROCEPHIN)  IV 2 g (02/11/20 1354)      LOS: 6 days   Marinda Elk  [facility]  02/12/2020, 7:29 AM

## 2020-02-12 NOTE — Progress Notes (Addendum)
Patient lab-glucose noted to be 64 this morning. Patient wanted chocolate ice cream and an ensure. No s/sx of hypoglycemia. No distress.   0600- Glucose rechecked using glucometr machine. 224.   0615- 5 units insulin given according to sliding scale.

## 2020-02-12 NOTE — Social Work (Cosign Needed)
  Re: Brandon [name] Date of Birth: 05/06/2022   To Whom It May Concern:  Please be advised that the above-named patient will require a short-term nursing home stay - anticipated 30 days or less for rehabilitation and strengthening.  The plan is for return home.

## 2020-02-12 NOTE — Progress Notes (Signed)
Hypoglycemic Event  CBG: 69  Treatment: 4 oz juice/soda pt scheduled Ensure and crackers  Symptoms: Shaky  Follow-up CBG: Time:1613 CBG Result:182  Possible Reasons for Event: Other: Levemir given early this morning  Comments/MD notified:Paged David Stall, MD   Held sliding scale, however administered 4 units of scheduled insulin since pt was eating his dinner tray

## 2020-02-12 NOTE — Plan of Care (Addendum)
  Problem: Metabolic: Goal: Ability to maintain appropriate glucose levels will improve Note: Blood glucose levels monitored as per MD, with prn sliding scale coverage given as needed and if applicable. Patient educated on identifying s/s hypo/hyperglycemic episodes. Will continue to monitor throughout shift. Pt needing more information on glucose monitoring and insulin administration.

## 2020-02-12 NOTE — Progress Notes (Signed)
Hypoglycemic Event  CBG: 51  Treatment: pt eating lunch tray at this time  Symptoms: None  Follow-up CBG: Time: 1226 CBG Result: 97  Possible Reasons for Event: Other: no snack in between breakfast and lunch  Comments/MD notified:Paged David Stall, MD    Radford Pax. #[phone]

## 2020-02-13 LAB — GLUCOSE, CAPILLARY
Glucose-Capillary: 110 mg/dL — ABNORMAL HIGH (ref 70–99)
Glucose-Capillary: 155 mg/dL — ABNORMAL HIGH (ref 70–99)
Glucose-Capillary: 156 mg/dL — ABNORMAL HIGH (ref 70–99)
Glucose-Capillary: 71 mg/dL (ref 70–99)

## 2020-02-13 LAB — AEROBIC/ANAEROBIC CULTURE W GRAM STAIN (SURGICAL/DEEP WOUND)

## 2020-02-13 LAB — BASIC METABOLIC PANEL
Anion gap: 7 (ref 5–15)
BUN: 16 mg/dL (ref 6–20)
CO2: 28 mmol/L (ref 22–32)
Calcium: 8.7 mg/dL — ABNORMAL LOW (ref 8.9–10.3)
Chloride: 101 mmol/L (ref 98–111)
Creatinine, Ser: 0.47 mg/dL — ABNORMAL LOW (ref 0.61–1.24)
GFR calc Af Amer: >60 mL/min (ref >60)
GFR calc non Af Amer: >60 mL/min (ref >60)
Glucose, Bld: 107 mg/dL — ABNORMAL HIGH (ref 70–99)
Potassium: 4.5 mmol/L (ref 3.5–5.1)
Sodium: 136 mmol/L (ref 135–145)

## 2020-02-13 LAB — MAGNESIUM: Magnesium: 1.8 mg/dL (ref 1.7–2.4)

## 2020-02-13 MED ORDER — SODIUM CHLORIDE 0.9 % IV SOLN: INTRAVENOUS | Status: AC

## 2020-02-13 MED ORDER — METOPROLOL TARTRATE 25 MG PO TABS: 25.0000 mg | ORAL_TABLET | Freq: Two times a day (BID) | ORAL | Status: DC

## 2020-02-13 NOTE — Progress Notes (Signed)
[facility] PROGRESS NOTE    Progress Note  Brandon [name]  GXQ:[id number] DOB: Jun 02, 1966 DOA: 02/06/2020 PCP: Armando Gang, FNP     Brief Narrative:   Brandon [name] is an 54 y.o. male past medical history of alcohol abuse, CVA alcoholic cirrhosis, seizure disorder, chronic pancreatitis, uncontrolled diabetes mellitus presents to the hospital with generalized weakness and progressive back pain he relates that he has been having this back pain for about 3 months progressively getting worst CT scan of the abdomen and pelvis was notable for discitis on T9-T10 and T10 and 1011 with fluid collection noted posteriorly in the right pleural space, MRI of the T-spine confirmed discitis and osteomyelitis with an epidural abscess measuring about 5 mm causing moderate T9-T10 and severe T9 T11 spinal stenosis, large amounts of paravertebral phlegmon at T9 T11 level and empyema of the right posterior chest neurosurgery was consulted for evaluation  Assessment/Plan:   Right pleural effusion: Status Post right chest removal imaging showed no pneumothorax. Fluid culture grew strep pneumo sensitive to Rocephin and penicillin. Ultrasound remained negative till date, continue IV Rocephin. Patient is awaiting skilled nursing facility placement.  Thoracic spine discitis, osteomyelitis with abscess: Neurosurgery and infectious disease are on board and appreciate assistance, probably strep pneumo discitis, continue IV Rocephin for 6 weeks. PICC line placed on 02/10/2020. Physical therapy evaluated the patient recommended skilled nursing facility, probably for skilled on 02/14/2020.  Hypomagnesemia: Repleted, continue to monitor intermittently.  Alcohol dependence/abuse: He endorses withdrawal symptoms in the past. Discontinue Ativan protocol continue thiamine and folate.  Alcoholic cirrhosis: He continues to drink see above for further details.  Chronic pancreatitis: Incidental finding on  MRI of the lumbar spine that showed a hypodensity in the pancreas head, will need MRI of the pancreas as an outpatient for surveillance.  Essential hypertension: Metoprolol was resumed, his tachycardia resolved, blood pressure is adequate.  Diabetes mellitus type 2: With an A1c of 7.1, his Metformin was discontinued, will double the long-acting insulin continue sliding scale.  Blood glucose was started up to 400.  Hyperlipidemia: Excellent continue statins.  Seizure disorder: Continue Depakote.  Nicotine use: Continue nicotine patch.   DVT prophylaxis: lovenox Family Communication:none Status is: Inpatient  Remains inpatient appropriate because:Hemodynamically unstable   Dispo: The patient is from: Home              Anticipated d/c is to: Skilled nursing facility placement              Anticipated d/c date is: 2 days              Patient currently is medically stable to d/c.  Patient is currently stable for discharge will need 6 weeks of IV antibiotics  Code Status:     Code Status Orders  (From admission, onward)         Start     Ordered   02/06/20 2130  Full code  Continuous     02/06/20 2129        Code Status History    Date Active Date Inactive Code Status Order ID Comments User Context   03/12/2016 1817 03/29/2016 1546 Full Code [id number]  Lynnae Prude Inpatient   03/05/2016 2146 03/12/2016 1817 Full Code [id number]  Coralie Keens, MD ED   04/06/2015 1030 04/07/2015 1325 Full Code [id number]  Annice Needy, MD Inpatient   03/16/2015 1433 03/17/2015 1321 Full Code [id number]  Laurier Nancy, MD Inpatient   03/15/2015 1947 03/16/2015  1433 Full Code [id number]  Laurier Nancy, MD Inpatient   Advance Care Planning Activity    Advance Directive Documentation     Most Recent Value  Type of Advance Directive  Healthcare Power of Attorney  Pre-existing out of facility DNR order (yellow form or pink MOST form)  --  "MOST" Form in Place?  --         IV Access:    Peripheral IV   Procedures and diagnostic studies:   No results found.   Medical Consultants:    None.  Anti-Infectives:   vanc and rocephin  Subjective:    Brandon [name] no complaints.  Objective:    Vitals:   02/12/20 2003 02/13/20 0000 02/13/20 0400 02/13/20 0710  BP: (!) 143/78 108/76 116/69 129/85  Pulse: 81 66 68 73  Resp: 20 10 11 14   Temp: 98.1 F (36.7 C) 98 F (36.7 C) 98 F (36.7 C) 98.6 F (37 C)  TempSrc: Oral Oral Oral   SpO2: 99% 98% 98% 100%  Weight:      Height:       SpO2: 100 % O2 Flow Rate (L/min): 3 L/min   Intake/Output Summary (Last 24 hours) at 02/13/2020 1036 Last data filed at 02/13/2020 0842 Gross per 24 hour  Intake 360 ml  Output 1575 ml  Net -1215 ml   Filed Weights   02/06/20 2013  Weight: 81.6 kg    Exam: General exam: In no acute distress. Respiratory system: Good air movement and clear to auscultation, chest tube is out. Cardiovascular system: S1 & S2 heard, RRR. No JVD. Gastrointestinal system: Abdomen is nondistended, soft and nontender.  Extremities: No pedal edema. Skin: No rashes, lesions or ulcers  Data Reviewed:    Labs: Basic Metabolic Panel: Recent Labs  Lab 02/07/20 0029 02/07/20 0029 02/08/20 0606 02/08/20 0606 02/09/20 0330 02/09/20 0330 02/11/20 1128 02/11/20 1128 02/12/20 0410 02/13/20 0325  NA 134*   < > 135  --  133*  --  130*  --  135 136  K 3.8   < > 4.2   < > 4.4   < > 4.6   < > 4.4 4.5  CL 100   < > 101  --  100  --  96*  --  100 101  CO2 25   < > 25  --  26  --  27  --  29 28  GLUCOSE 234*   < > 179*  --  172*  --  238*  --  64* 107*  BUN 12   < > 13  --  13  --  16  --  17 16  CREATININE 0.54*   < > 0.58*  --  0.54*  --  0.47*  --  0.50* 0.47*  CALCIUM 8.7*   < > 8.7*  --  8.2*  --  8.9  --  9.0 8.7*  MG 1.3*  --  1.5*  --  2.1  --  1.8  --   --   --   PHOS 2.9  --   --   --   --   --   --   --   --   --    < > = values in this interval not  displayed.   GFR Estimated Creatinine Clearance: 114.5 mL/min (A) (by C-G formula based on SCr of 0.47 mg/dL (L)). Liver Function Tests: Recent Labs  Lab 02/06/20 1206 02/07/20 0029  AST 13* 13*  ALT 12 11  ALKPHOS 123 113  BILITOT 0.3 0.6  PROT 8.0 7.2  ALBUMIN 2.6* 2.4*   Recent Labs  Lab 02/06/20 1206  LIPASE 84*   Recent Labs  Lab 02/07/20 0029  AMMONIA 22   Coagulation profile Recent Labs  Lab 02/07/20 0029  INR 1.1   COVID-19 Labs  No results for input(s): DDIMER, FERRITIN, LDH, CRP in the last 72 hours.  Lab Results  Component Value Date   SARSCOV2NAA NEGATIVE 02/06/2020    CBC: Recent Labs  Lab 02/06/20 1206 02/07/20 0029 02/08/20 0606 02/09/20 0330  WBC 9.5 8.2 9.2 6.8  NEUTROABS  --  6.2  --   --   HGB 11.4* 9.9* 10.6* 9.2*  HCT 35.7* 30.7* 33.6* 28.8*  MCV 97.3 95.6 98.8 98.0  PLT 499* 407* 407* 343   Cardiac Enzymes: No results for input(s): CKTOTAL, CKMB, CKMBINDEX, TROPONINI in the last 168 hours. BNP (last 3 results) No results for input(s): PROBNP in the last 8760 hours. CBG: Recent Labs  Lab 02/12/20 1223 02/12/20 1613 02/12/20 2112 02/13/20 0025 02/13/20 0451  GLUCAP 97 182* 262* 110* 71   D-Dimer: No results for input(s): DDIMER in the last 72 hours. Hgb A1c: No results for input(s): HGBA1C in the last 72 hours. Lipid Profile: No results for input(s): CHOL, HDL, LDLCALC, TRIG, CHOLHDL, LDLDIRECT in the last 72 hours. Thyroid function studies: No results for input(s): TSH, T4TOTAL, T3FREE, THYROIDAB in the last 72 hours.  Invalid input(s): FREET3 Anemia work up: No results for input(s): VITAMINB12, FOLATE, FERRITIN, TIBC, IRON, RETICCTPCT in the last 72 hours. Sepsis Labs: Recent Labs  Lab 02/06/20 1206 02/07/20 0029 02/08/20 0606 02/09/20 0330  WBC 9.5 8.2 9.2 6.8   Microbiology Recent Results (from the past 240 hour(s))  Urine culture     Status: None   Collection Time: 02/06/20  1:24 PM   Specimen:  Urine, Random  Result Value Ref Range Status   Specimen Description URINE, RANDOM  Final   Special Requests NONE  Final   Culture   Final    NO GROWTH Performed at [facility], [address] [place], [place] [address]    Report Status 02/07/2020 FINAL  Final  Culture, blood (routine x 2)     Status: None   Collection Time: 02/06/20  5:05 PM   Specimen: BLOOD  Result Value Ref Range Status   Specimen Description BLOOD RIGHT ANTECUBITAL  Final   Special Requests   Final    BOTTLES DRAWN AEROBIC AND ANAEROBIC Blood Culture adequate volume   Culture   Final    NO GROWTH 5 DAYS Performed at [facility], [address] [place], [place] [address]    Report Status 02/11/2020 FINAL  Final  SARS Coronavirus 2 by RT PCR (hospital order, performed in [facility] hospital lab) Nasopharyngeal Nasopharyngeal Swab     Status: None   Collection Time: 02/06/20  6:13 PM   Specimen: Nasopharyngeal Swab  Result Value Ref Range Status   SARS Coronavirus 2 NEGATIVE NEGATIVE Final    Comment: (NOTE) SARS-CoV-2 target nucleic acids are NOT DETECTED. The SARS-CoV-2 RNA is generally detectable in upper and lower respiratory specimens during the acute phase of infection. The lowest concentration of SARS-CoV-2 viral copies this assay can detect is 250 copies / mL. A negative result does not preclude SARS-CoV-2 infection and should not be used as the sole basis for treatment or other patient management decisions.  A negative result  may occur with improper specimen collection / handling, submission of specimen other than nasopharyngeal swab, presence of viral mutation(s) within the areas targeted by this assay, and inadequate number of viral copies (<250 copies / mL). A negative result must be combined with clinical observations, patient history, and epidemiological information. Fact Sheet for Patients:   BoilerBrush.com.cy Fact Sheet for Healthcare  Providers: https://pope.com/ This test is not yet approved or cleared  by the [place] FDA and has been authorized for detection and/or diagnosis of SARS-CoV-2 by FDA under an Emergency Use Authorization (EUA).  This EUA will remain in effect (meaning this test can be used) for the duration of the COVID-19 declaration under Section 564(b)(1) of the Act, 21 U.S.C. section 360bbb-3(b)(1), unless the authorization is terminated or revoked sooner. Performed at [facility], [address], [place], [place] [address]   Culture, blood (routine x 2)     Status: None   Collection Time: 02/06/20  8:40 PM   Specimen: BLOOD  Result Value Ref Range Status   Specimen Description BLOOD RIGHT ANTECUBITAL  Final   Special Requests   Final    BOTTLES DRAWN AEROBIC AND ANAEROBIC Blood Culture adequate volume   Culture   Final    NO GROWTH 5 DAYS Performed at [facility], [address] [place], [place] [address]    Report Status 02/11/2020 FINAL  Final  MRSA PCR Screening     Status: None   Collection Time: 02/06/20 11:22 PM   Specimen: Nasal Mucosa; Nasopharyngeal  Result Value Ref Range Status   MRSA by PCR NEGATIVE NEGATIVE Final    Comment:        The GeneXpert MRSA Assay (FDA approved for NASAL specimens only), is one component of a comprehensive MRSA colonization surveillance program. It is not intended to diagnose MRSA infection nor to guide or monitor treatment for MRSA infections. Performed at [facility], [address], [place], [place] [address]   Aerobic/Anaerobic Culture (surgical/deep wound)     Status: None (Preliminary result)   Collection Time: 02/08/20  9:56 AM   Specimen: Abscess  Result Value Ref Range Status   Specimen Description ABSCESS RIGHT PLEURAL  Final   Special Requests NONE  Final   Gram Stain   Final    ABUNDANT WBC PRESENT, PREDOMINANTLY PMN ABUNDANT GRAM POSITIVE COCCI IN PAIRS IN CHAINS Performed at  [facility], [address] [place], [place] [address]    Culture   Final    FEW STREPTOCOCCUS PNEUMONIAE NO ANAEROBES ISOLATED; CULTURE IN PROGRESS FOR 5 DAYS    Report Status PENDING  Incomplete   Organism ID, Bacteria STREPTOCOCCUS PNEUMONIAE  Final      Susceptibility   Streptococcus pneumoniae - MIC*    ERYTHROMYCIN 2 RESISTANT Resistant     LEVOFLOXACIN <=0.25 SENSITIVE Sensitive     VANCOMYCIN 0.5 SENSITIVE Sensitive     PENO - penicillin 1      PENICILLIN (non-meningitis) 1 SENSITIVE Sensitive     PENICILLIN (oral) 1 INTERMEDIATE Intermediate     CEFTRIAXONE (non-meningitis) 1 SENSITIVE Sensitive     * FEW STREPTOCOCCUS PNEUMONIAE     Medications:   . busPIRone  10 mg Oral BID  . Chlorhexidine Gluconate Cloth  6 each Topical Daily  . divalproex  500 mg Oral Daily  . docusate sodium  100 mg Oral BID  . famotidine  20 mg Oral BID  . feeding supplement (GLUCERNA SHAKE)  237 mL  Oral TID BM  . feeding supplement (PRO-STAT SUGAR FREE 64)  30 mL Oral BID  . folic acid  1 mg Oral Daily  . insulin aspart  0-15 Units Subcutaneous TID WC  . insulin aspart  0-5 Units Subcutaneous QHS  . insulin aspart  4 Units Subcutaneous TID WC  . insulin detemir  20 Units Subcutaneous BID  . metoprolol tartrate  50 mg Oral BID  . multivitamin with minerals  1 tablet Oral Daily  . nicotine  21 mg Transdermal Daily  . simvastatin  20 mg Oral q1800  . sodium chloride flush  3 mL Intravenous Q12H  . thiamine  100 mg Oral Daily   Or  . thiamine  100 mg Intravenous Daily  . traZODone  150 mg Oral QHS   Continuous Infusions: . sodium chloride 100 mL/hr at 02/13/20 0759  . cefTRIAXone (ROCEPHIN)  IV 2 g (02/12/20 1244)      LOS: 7 days   Charlynne Cousins  [facility]  02/13/2020, 10:36 AM

## 2020-02-13 NOTE — Progress Notes (Signed)
Patient ID: Brandon [name], male   DOB: [birth year], 54 y.o.   MRN: [id number] BP 137/84   Pulse 66   Temp 98.4 F (36.9 C) (Oral)   Resp 13   Ht 6' (1.829 m)   Wt 81.6 kg   SpO2 100%   BMI 24.41 kg/m  Alert and oriented x 4 Moving all extremities well Getting prepared for discharge.  No operative indications at this time

## 2020-02-14 LAB — GLUCOSE, CAPILLARY
Glucose-Capillary: 123 mg/dL — ABNORMAL HIGH (ref 70–99)
Glucose-Capillary: 124 mg/dL — ABNORMAL HIGH (ref 70–99)
Glucose-Capillary: 142 mg/dL — ABNORMAL HIGH (ref 70–99)
Glucose-Capillary: 152 mg/dL — ABNORMAL HIGH (ref 70–99)
Glucose-Capillary: 154 mg/dL — ABNORMAL HIGH (ref 70–99)
Glucose-Capillary: 188 mg/dL — ABNORMAL HIGH (ref 70–99)
Glucose-Capillary: 352 mg/dL — ABNORMAL HIGH (ref 70–99)
Glucose-Capillary: 69 mg/dL — ABNORMAL LOW (ref 70–99)
Glucose-Capillary: 69 mg/dL — ABNORMAL LOW (ref 70–99)
Glucose-Capillary: 89 mg/dL (ref 70–99)

## 2020-02-14 NOTE — Progress Notes (Signed)
Pt had symptomatic hypoglycemia overnight and this morning. He refused all morning insulin. He states that he takes Metformin at home and that his blood sugar stays in the low 100's.

## 2020-02-14 NOTE — TOC Progression Note (Signed)
Transition of Care [facility]) - Progression Note    Patient Details  Name: Brandon [name] MRN: [id number] Date of Birth: 07/28/1966  [facility]) CM/SW Contact  Eduard Roux, [place] Phone Number: 02/14/2020, 12:24 PM  Clinical Narrative:     PASRR assessment/interview completed by the sate today. PASRR remains pending.  Antony Blackbird, MSW, LCSWA Clinical Social Worker   Expected Discharge Plan: Home/Self Care Barriers to Discharge: Continued Medical Work up  Expected Discharge Plan and Services Expected Discharge Plan: Home/Self Care In-house Referral: Clinical Social Work Discharge Planning Services: CM Consult   Living arrangements for the past 2 months: Single Family Home                                       Social Determinants of Health (SDOH) Interventions    Readmission Risk Interventions No flowsheet data found.

## 2020-02-14 NOTE — Progress Notes (Signed)
Inpatient Diabetes Program Recommendations  AACE/ADA: New Consensus Statement on Inpatient Glycemic Control (2015)  Target Ranges:  Prepandial:   less than 140 mg/dL      Peak postprandial:   less than 180 mg/dL (1-2 hours)      Critically ill patients:  140 - 180 mg/dL   Lab Results  Component Value Date   GLUCAP 89 02/14/2020   HGBA1C 7.1 (H) 02/08/2020    Review of Glycemic Control Results for OCIEL, RETHERFORD (MRN [id number]) as of 02/14/2020 09:42  Ref. Range 02/13/2020 04:51 02/13/2020 11:28 02/13/2020 16:01 02/13/2020 20:54 02/14/2020 00:13 02/14/2020 06:09 02/14/2020 06:36  Glucose-Capillary Latest Ref Range: 70 - 99 mg/dL 71 940 (H) 005 (H) 056 (H) 152 (H) 69 (L) 89   Diabetes history: DM 2 Outpatient Diabetes medications: Metformin 1000 mg bid Current orders for Inpatient glycemic control:  Levemir 20 units bid Novolog 0-15 units tid + hs Novolog 4 units tid  Inpatient Diabetes Program Recommendations:    Glucose 69 this am. Consider decreasing Levemir to 16-18 units bid.  Thanks,  Christena Deem RN, MSN, BC-ADM Inpatient Diabetes Coordinator Team Pager [phone] (8a-5p)

## 2020-02-14 NOTE — Progress Notes (Signed)
.    Back pain and has improved.  Mobility improving.  Numbness lower extremities.  His Foley catheter is in place.  He has good sacral sensation.  Continue IV antibiotics for strep pneumo osteomyelitis discitis. General recommendations from our standpoint.  Please call with changes

## 2020-02-14 NOTE — Progress Notes (Signed)
Physical Therapy Treatment Patient Details Name: Brandon [name] MRN: [id number] DOB: [birth year] Today's Date: 02/14/2020    History of Present Illness Brandon [name] is a 54 year old Caucasian male with past medical history notable for EtOH abuse, CVA, EtOH cirrhosis, seizure disorder, chronic pancreatitis, HTN, type 2 diabetes mellitus, HLD with generalized fatigue, and progressive back pain.  Thought he had exacerbation of his chornic pancreatitis, MR T-spine notable for discitis/osteomyelitis at T9/10 and T10/11 with ventral epidural abscess measuring up to 5 mm in thickness and causing moderate T9/10 and severe T10/11 spinal canal stenosis. PICC line placed 5/19.    PT Comments    Patient progressing with mobility again out for hallway ambulation this session with assist for safety with set up in the room prior to mobility.  He reports interest in skilled rehab prior to home.  Feel he can benefit for safety reasons as sister not home till after 5-6 pm and pt with decreased safety awareness.  PT to follow acutely.   Follow Up Recommendations  SNF;Supervision/Assistance - 24 hour     Equipment Recommendations  Rolling walker with 5" wheels    Recommendations for Other Services       Precautions / Restrictions Precautions Precautions: Fall    Mobility  Bed Mobility   Bed Mobility: Supine to Sit     Supine to sit: Supervision        Transfers   Equipment used: Rolling walker (2 wheeled) Transfers: Sit to/from Stand Sit to Stand: Supervision         General transfer comment: pushing up from EOB unaided prior to environmental set up, but waited while I finished prior to walking  Ambulation/Gait Ambulation/Gait assistance: Min guard;Supervision Gait Distance (Feet): 150 Feet Assistive device: Rolling walker (2 wheeled) Gait Pattern/deviations: Step-through pattern;Trunk flexed     General Gait Details: hallway ambulation with minguard at times for  directions   Stairs             Wheelchair Mobility    Modified Rankin (Stroke Patients Only)       Balance Overall balance assessment: Needs assistance   Sitting balance-Leahy Scale: Good       Standing balance-Leahy Scale: Fair Standing balance comment: can stand without UE support                            Cognition Arousal/Alertness: Awake/alert Behavior During Therapy: Flat affect;Impulsive Overall Cognitive Status: Within Functional Limits for tasks assessed                                 General Comments: some safety awareness issues getting up quickly from EOB needing assist for environmental set up      Exercises      General Comments General comments (skin integrity, edema, etc.): VSS with activity      Pertinent Vitals/Pain Pain Score: 5  Pain Location: mid back Pain Descriptors / Indicators: Sore;Aching Pain Intervention(s): Monitored during session    Home Living                      Prior Function            PT Goals (current goals can now be found in the care plan section) Progress towards PT goals: Progressing toward goals    Frequency    Min 3X/week      PT  Plan Current plan remains appropriate    Co-evaluation              AM-PAC PT "6 Clicks" Mobility   Outcome Measure  Help needed turning from your back to your side while in a flat bed without using bedrails?: None Help needed moving from lying on your back to sitting on the side of a flat bed without using bedrails?: None Help needed moving to and from a bed to a chair (including a wheelchair)?: A Little Help needed standing up from a chair using your arms (e.g., wheelchair or bedside chair)?: A Little Help needed to walk in hospital room?: A Little Help needed climbing 3-5 steps with a railing? : A Lot 6 Click Score: 19    End of Session   Activity Tolerance: Patient tolerated treatment well Patient left: in chair;with  call bell/phone within reach;with chair alarm set   PT Visit Diagnosis: Other abnormalities of gait and mobility (R26.89);Muscle weakness (generalized) (M62.81)     Time: 9629-5284 PT Time Calculation (min) (ACUTE ONLY): 26 min  Charges:  $Gait Training: 8-22 mins $Therapeutic Activity: 8-22 mins                     Magda Kiel, [place] [phone] 02/14/2020    Reginia Naas 02/14/2020, 5:31 PM

## 2020-02-14 NOTE — Progress Notes (Signed)
Hypoglycemic Event  CBG: 69  Treatment: 4 oz apple juice and 2 crackers  Symptoms: Hungry  Follow-up CBG: [id number] CBG Result:89  Possible Reasons for Event: Unknown  Comments/MD notified:Paged night float MD

## 2020-02-14 NOTE — TOC Progression Note (Addendum)
Transition of Care [facility]) - Progression Note    Patient Details  Name: Brandon [name] MRN: [id number] Date of Birth: 09/30/1965  [facility] of [facility]) CM/SW Contact  Eduard Roux, [place] Phone Number: 02/14/2020, 10:51 AM  Clinical Narrative:     Patient has no SNF/ bed offers PASRR  pending    Antony Blackbird, MSW, LCSWA Clinical Social Worker   Expected Discharge Plan: Home/Self Care Barriers to Discharge: Continued Medical Work up  Expected Discharge Plan and Services Expected Discharge Plan: Home/Self Care In-house Referral: Clinical Social Work Discharge Planning Services: CM Consult   Living arrangements for the past 2 months: Single Family Home                                       Social Determinants of Health (SDOH) Interventions    Readmission Risk Interventions No flowsheet data found.

## 2020-02-14 NOTE — Progress Notes (Signed)
[facility] PROGRESS NOTE    Progress Note  Brandon [name]  OVF:[id number] DOB: [birth year] DOA: 02/06/2020 PCP: Armando Gang, FNP     Brief Narrative:   Brandon [name] is an 54 y.o. male past medical history of alcohol abuse, CVA alcoholic cirrhosis, seizure disorder, chronic pancreatitis, uncontrolled diabetes mellitus presents to the hospital with generalized weakness and progressive back pain he relates that he has been having this back pain for about 3 months progressively getting worst CT scan of the abdomen and pelvis was notable for discitis on T9-T10 and T10 and 1011 with fluid collection noted posteriorly in the right pleural space, MRI of the T-spine confirmed discitis and osteomyelitis with an epidural abscess measuring about 5 mm causing moderate T9-T10 and severe T9 T11 spinal stenosis, large amounts of paravertebral phlegmon at T9 T11 level and empyema of the right posterior chest neurosurgery was consulted for evaluation  Assessment/Plan:   Right pleural effusion: Status Post right chest removal imaging showed no pneumothorax. Fluid culture grew strep pneumo sensitive to Rocephin and penicillin. Ultrasound remained negative till date, continue IV Rocephin. Patient is awaiting skilled nursing facility placement.  Thoracic spine discitis, osteomyelitis with abscess: Neurosurgery and infectious disease are on board and appreciate assistance, probably strep pneumo discitis, continue IV Rocephin for 6 weeks. PICC line placed on 02/10/2020. Physical therapy evaluated the patient recommended skilled nursing facility.  Hypomagnesemia: Repleted, continue to monitor intermittently.  Alcohol dependence/abuse: He endorses withdrawal symptoms in the past. Discontinue Ativan protocol continue thiamine and folate.  Alcoholic cirrhosis: He continues to drink see above for further details.  Chronic pancreatitis: Incidental finding on MRI of the lumbar spine that  showed a hypodensity in the pancreas head, will need MRI of the pancreas as an outpatient for surveillance.  Essential hypertension: Metoprolol was resumed, his tachycardia resolved, blood pressure is adequate.  Diabetes mellitus type 2: With an A1c of 7.1, his Metformin was discontinued, will double the long-acting insulin continue sliding scale.  Blood glucose was started up to 400.  Hyperlipidemia: Excellent continue statins.  Seizure disorder: Continue Depakote.  Nicotine use: Continue nicotine patch.   DVT prophylaxis: lovenox Family Communication:none Status is: Inpatient  Remains inpatient appropriate because:Hemodynamically unstable   Dispo: The patient is from: Home              Anticipated d/c is to: Skilled nursing facility placement              Anticipated d/c date is: 2 days              Patient currently is medically stable to d/c.  Patient is currently stable for discharge, awaiting skilled nursing facility placement, will need 6 weeks of IV antibiotics  Code Status:     Code Status Orders  (From admission, onward)         Start     Ordered   02/06/20 2130  Full code  Continuous     02/06/20 2129        Code Status History    Date Active Date Inactive Code Status Order ID Comments User Context   03/12/2016 1817 03/29/2016 1546 Full Code [id number]  Lynnae Prude Inpatient   03/05/2016 2146 03/12/2016 1817 Full Code [id number]  Coralie Keens, MD ED   04/06/2015 1030 04/07/2015 1325 Full Code [id number]  Annice Needy, MD Inpatient   03/16/2015 1433 03/17/2015 1321 Full Code [id number]  Laurier Nancy, MD Inpatient   03/15/2015 1947 03/16/2015  [place] Full Code [id number]  Dionisio David, MD Inpatient   Advance Care Planning Activity    Advance Directive Documentation     Most Recent Value  Type of Advance Directive  Healthcare Power of Attorney  Pre-existing out of facility DNR order (yellow form or pink MOST form)  --  "MOST" Form in Place?   --        IV Access:    Peripheral IV   Procedures and diagnostic studies:   No results found.   Medical Consultants:    None.  Anti-Infectives:   vanc and rocephin  Subjective:    Brandon [name] no complaints.  Objective:    Vitals:   02/14/20 0318 02/14/20 0607 02/14/20 0802 02/14/20 0900  BP: 97/68 115/67 (!) 65/48 117/80  Pulse: 71 69 73 71  Resp: 13 18 15 12   Temp:   98.2 F (36.8 C)   TempSrc: Oral  Oral   SpO2: 99% 99% 98% 98%  Weight:      Height:       SpO2: 98 % O2 Flow Rate (L/min): 3 L/min   Intake/Output Summary (Last 24 hours) at 02/14/2020 1108 Last data filed at 02/13/2020 2303 Gross per 24 hour  Intake 709.55 ml  Output 1400 ml  Net -690.45 ml   Filed Weights   02/06/20 2013  Weight: 81.6 kg    Exam: General exam: In no acute distress. Respiratory system: Good air movement and clear to auscultation, chest tube is out. Cardiovascular system: S1 & S2 heard, RRR. No JVD. Gastrointestinal system: Abdomen is nondistended, soft and nontender.  Extremities: No pedal edema. Skin: No rashes, lesions or ulcers  Data Reviewed:    Labs: Basic Metabolic Panel: Recent Labs  Lab 02/08/20 0606 02/08/20 0606 02/09/20 0330 02/09/20 0330 02/11/20 1128 02/11/20 1128 02/12/20 0410 02/13/20 0325  NA 135  --  133*  --  130*  --  135 136  K 4.2   < > 4.4   < > 4.6   < > 4.4 4.5  CL 101  --  100  --  96*  --  100 101  CO2 25  --  26  --  27  --  29 28  GLUCOSE 179*  --  172*  --  238*  --  64* 107*  BUN 13  --  13  --  16  --  17 16  CREATININE 0.58*  --  0.54*  --  0.47*  --  0.50* 0.47*  CALCIUM 8.7*  --  8.2*  --  8.9  --  9.0 8.7*  MG 1.5*  --  2.1  --  1.8  --   --  1.8   < > = values in this interval not displayed.   GFR Estimated Creatinine Clearance: 114.5 mL/min (A) (by C-G formula based on SCr of 0.47 mg/dL (L)). Liver Function Tests: No results for input(s): AST, ALT, ALKPHOS, BILITOT, PROT, ALBUMIN in the last 168  hours. No results for input(s): LIPASE, AMYLASE in the last 168 hours. No results for input(s): AMMONIA in the last 168 hours. Coagulation profile No results for input(s): INR, PROTIME in the last 168 hours. COVID-19 Labs  No results for input(s): DDIMER, FERRITIN, LDH, CRP in the last 72 hours.  Lab Results  Component Value Date   [place] NEGATIVE 02/06/2020    CBC: Recent Labs  Lab 02/08/20 0606 02/09/20 0330  WBC 9.2 6.8  HGB 10.6* 9.2*  HCT 33.6* 28.8*  MCV 98.8 98.0  PLT 407* 343   Cardiac Enzymes: No results for input(s): CKTOTAL, CKMB, CKMBINDEX, TROPONINI in the last 168 hours. BNP (last 3 results) No results for input(s): PROBNP in the last 8760 hours. CBG: Recent Labs  Lab 02/13/20 2054 02/14/20 0013 02/14/20 0609 02/14/20 0636 02/14/20 1027  GLUCAP 156* 152* 69* 89 154*   D-Dimer: No results for input(s): DDIMER in the last 72 hours. Hgb A1c: No results for input(s): HGBA1C in the last 72 hours. Lipid Profile: No results for input(s): CHOL, HDL, LDLCALC, TRIG, CHOLHDL, LDLDIRECT in the last 72 hours. Thyroid function studies: No results for input(s): TSH, T4TOTAL, T3FREE, THYROIDAB in the last 72 hours.  Invalid input(s): FREET3 Anemia work up: No results for input(s): VITAMINB12, FOLATE, FERRITIN, TIBC, IRON, RETICCTPCT in the last 72 hours. Sepsis Labs: Recent Labs  Lab 02/08/20 0606 02/09/20 0330  WBC 9.2 6.8   Microbiology Recent Results (from the past 240 hour(s))  Urine culture     Status: None   Collection Time: 02/06/20  1:24 PM   Specimen: Urine, Random  Result Value Ref Range Status   Specimen Description URINE, RANDOM  Final   Special Requests NONE  Final   Culture   Final    NO GROWTH Performed at [facility], [address], [place], [place] [address]    Report Status 02/07/2020 FINAL  Final  Culture, blood (routine x 2)     Status: None   Collection Time: 02/06/20  5:05 PM   Specimen: BLOOD  Result Value Ref  Range Status   Specimen Description BLOOD RIGHT ANTECUBITAL  Final   Special Requests   Final    BOTTLES DRAWN AEROBIC AND ANAEROBIC Blood Culture adequate volume   Culture   Final    NO GROWTH 5 DAYS Performed at [facility], [address], [place], [place] [address]    Report Status 02/11/2020 FINAL  Final  SARS Coronavirus 2 by RT PCR (hospital order, performed in [facility] hospital lab) Nasopharyngeal Nasopharyngeal Swab     Status: None   Collection Time: 02/06/20  6:13 PM   Specimen: Nasopharyngeal Swab  Result Value Ref Range Status   SARS Coronavirus 2 NEGATIVE NEGATIVE Final    Comment: (NOTE) SARS-CoV-2 target nucleic acids are NOT DETECTED. The SARS-CoV-2 RNA is generally detectable in upper and lower respiratory specimens during the acute phase of infection. The lowest concentration of SARS-CoV-2 viral copies this assay can detect is 250 copies / mL. A negative result does not preclude SARS-CoV-2 infection and should not be used as the sole basis for treatment or other patient management decisions.  A negative result may occur with improper specimen collection / handling, submission of specimen other than nasopharyngeal swab, presence of viral mutation(s) within the areas targeted by this assay, and inadequate number of viral copies (<250 copies / mL). A negative result must be combined with clinical observations, patient history, and epidemiological information. Fact Sheet for Patients:   BoilerBrush.com.cy Fact Sheet for Healthcare Providers: https://pope.com/ This test is not yet approved or cleared  by the [place] FDA and has been authorized for detection and/or diagnosis of SARS-CoV-2 by FDA under an Emergency Use Authorization (EUA).  This EUA will remain in effect (meaning this test can be used) for the duration of the COVID-19 declaration under Section 564(b)(1) of the Act, 21 U.S.C. section  360bbb-3(b)(1), unless the authorization is terminated or revoked sooner. Performed at [facility], [address] [place], [place] [address]   Culture, blood (routine x 2)     Status: None   Collection Time: 02/06/20  8:40 PM   Specimen: BLOOD  Result Value Ref Range Status   Specimen Description BLOOD RIGHT ANTECUBITAL  Final   Special Requests   Final    BOTTLES DRAWN AEROBIC AND ANAEROBIC Blood Culture adequate volume   Culture   Final    NO GROWTH 5 DAYS Performed at [facility], [address], [place], [place] [address]    Report Status 02/11/2020 FINAL  Final  MRSA PCR Screening     Status: None   Collection Time: 02/06/20 11:22 PM   Specimen: Nasal Mucosa; Nasopharyngeal  Result Value Ref Range Status   MRSA by PCR NEGATIVE NEGATIVE Final    Comment:        The GeneXpert MRSA Assay (FDA approved for NASAL specimens only), is one component of a comprehensive MRSA colonization surveillance program. It is not intended to diagnose MRSA infection nor to guide or monitor treatment for MRSA infections. Performed at [facility], [address] [place], [place] [address]   Aerobic/Anaerobic Culture (surgical/deep wound)     Status: None   Collection Time: 02/08/20  9:56 AM   Specimen: Abscess  Result Value Ref Range Status   Specimen Description ABSCESS RIGHT PLEURAL  Final   Special Requests NONE  Final   Gram Stain   Final    ABUNDANT WBC PRESENT, PREDOMINANTLY PMN ABUNDANT GRAM POSITIVE COCCI IN PAIRS IN CHAINS    Culture   Final    FEW STREPTOCOCCUS PNEUMONIAE NO ANAEROBES ISOLATED Performed at [facility], [address] [place], [place] [address]    Report Status 02/13/2020 FINAL  Final   Organism ID, Bacteria STREPTOCOCCUS PNEUMONIAE  Final      Susceptibility   Streptococcus pneumoniae - MIC*    ERYTHROMYCIN 2 RESISTANT Resistant     LEVOFLOXACIN <=0.25 SENSITIVE Sensitive     VANCOMYCIN 0.5 SENSITIVE Sensitive     PENO -  penicillin 1      PENICILLIN (non-meningitis) 1 SENSITIVE Sensitive     PENICILLIN (oral) 1 INTERMEDIATE Intermediate     CEFTRIAXONE (non-meningitis) 1 SENSITIVE Sensitive     * FEW STREPTOCOCCUS PNEUMONIAE     Medications:   . busPIRone  10 mg Oral BID  . Chlorhexidine Gluconate Cloth  6 each Topical Daily  . divalproex  500 mg Oral Daily  . docusate sodium  100 mg Oral BID  . famotidine  20 mg Oral BID  . feeding supplement (GLUCERNA SHAKE)  237 mL Oral TID BM  . feeding supplement (PRO-STAT SUGAR FREE 64)  30 mL Oral BID  . folic acid  1 mg Oral Daily  . insulin aspart  0-15 Units Subcutaneous TID WC  . insulin aspart  0-5 Units Subcutaneous QHS  . insulin aspart  4 Units Subcutaneous TID WC  . insulin detemir  20 Units Subcutaneous BID  . metoprolol tartrate  50 mg Oral BID  . multivitamin with minerals  1 tablet Oral Daily  . nicotine  21 mg Transdermal Daily  . simvastatin  20 mg Oral q1800  . sodium chloride flush  3 mL Intravenous Q12H  . thiamine  100 mg Oral Daily   Or  . thiamine  100 mg Intravenous Daily  . traZODone  150 mg Oral QHS   Continuous Infusions: . cefTRIAXone (ROCEPHIN)  IV 2 g (02/13/20 1145)  LOS: 8 days   Brandon [name]  [facility]  02/14/2020, 11:08 AM

## 2020-02-15 LAB — GLUCOSE, CAPILLARY
Glucose-Capillary: 96 mg/dL (ref 70–99)
Glucose-Capillary: 156 mg/dL — ABNORMAL HIGH (ref 70–99)
Glucose-Capillary: 174 mg/dL — ABNORMAL HIGH (ref 70–99)
Glucose-Capillary: 289 mg/dL — ABNORMAL HIGH (ref 70–99)

## 2020-02-15 NOTE — Progress Notes (Signed)
Occupational Therapy Treatment Patient Details Name: Brandon [name] MRN: [id number] DOB: Mar 14, 1966 Today's Date: 02/15/2020    History of present illness Brandon [name] is a 54 year old Caucasian male with past medical history notable for EtOH abuse, CVA, EtOH cirrhosis, seizure disorder, chronic pancreatitis, HTN, type 2 diabetes mellitus, HLD with generalized fatigue, and progressive back pain.  Thought he had exacerbation of his chornic pancreatitis, MR T-spine notable for discitis/osteomyelitis at T9/10 and T10/11 with ventral epidural abscess measuring up to 5 mm in thickness and causing moderate T9/10 and severe T10/11 spinal canal stenosis. PICC line placed 5/19.   OT comments  Pt making steady progress towards OT goals this session. Pt initially hesitant to participate in session, however pt reports wanting to sit EOB to finish lunch. Supervision for all bed mobility, noted pt laying in soiled sheets from urine. Pt states he was aware bed was wet but did not demonstrate urgency to get OOB. MIN guard- MIN A for functional mobility with RW, MIN - MOD  A for LB ADLs this session, set- up- supervision for LB bathing from toilet and min guard for toilet transfer with RW. Pts RR increase to 38 with mobility;pt on RA with O2 dropping to 86% briefly however poor pleth noted. pt denies SOB. DC plan below remains appropriate, will follow acutely per POC.   Follow Up Recommendations  No OT follow up;Supervision - Intermittent    Equipment Recommendations  None recommended by OT    Recommendations for Other Services      Precautions / Restrictions Precautions Precautions: Fall Restrictions Weight Bearing Restrictions: No       Mobility Bed Mobility Overal bed mobility: Needs Assistance Bed Mobility: Supine to Sit;Sit to Supine     Supine to sit: Supervision Sit to supine: Supervision   General bed mobility comments: no physical assist needed; supervision for  safety  Transfers Overall transfer level: Needs assistance Equipment used: Rolling walker (2 wheeled) Transfers: Sit to/from Stand Sit to Stand: Min guard         General transfer comment: min guard for safety and line mgmt    Balance Overall balance assessment: Needs assistance Sitting-balance support: Feet supported;No upper extremity supported Sitting balance-Leahy Scale: Fair     Standing balance support: Bilateral upper extremity supported;Single extremity supported Standing balance-Leahy Scale: Fair Standing balance comment: can static stand with no UE support                           ADL either performed or assessed with clinical judgement   ADL Overall ADL's : Needs assistance/impaired             Lower Body Bathing: Supervison/ safety;Set up;Sitting/lateral leans Lower Body Bathing Details (indicate cue type and reason): sitting on toilet for anterior pericare after pt noted to be soiled with urine     Lower Body Dressing: Moderate assistance;Sit to/from stand;Cueing for compensatory techniques;Minimal assistance Lower Body Dressing Details (indicate cue type and reason): MOD A to don new underwear while seated on toilet; pt able to thread RLE but required assist for LLE; MINA to pull underwear to waist line in standing Toilet Transfer: Min guard;Ambulation;RW;Regular Toilet;Grab bars;Cueing for safety Toilet Transfer Details (indicate cue type and reason): use of grab bars noted; cues for RW mgmt         Functional mobility during ADLs: Minimal assistance;Rolling walker General ADL Comments: pt with improved activity tolerance this session able to complete functional mobility  greater than a household distance with RW and MIN A. Pt complete toileting with supervision but requried MIN- MOD A for LB dressing via sit<>stand     Vision       Perception     Praxis      Cognition Arousal/Alertness: Awake/alert Behavior During Therapy: Flat  affect Overall Cognitive Status: Impaired/Different from baseline Area of Impairment: Problem solving;Safety/judgement;Awareness                         Safety/Judgement: Decreased awareness of deficits;Decreased awareness of safety Awareness: Intellectual Problem Solving: Slow processing General Comments: pt with some safety concerns noted with impaired awareness as pt noted to be soiled in urine in bed. pt reports knowing he was soilded but unable to problem solve through the importance of getting off of soiled pads.      Exercises     Shoulder Instructions       General Comments RR increase to 38 with mobility;pt on RA with O2 dropping to 86% briefly however poor pleth noted. pt denies SOB. pt found in soiled brief with pt not showing anytime of urgency to change underwear- RN aware    Pertinent Vitals/ Pain       Pain Assessment: Faces Faces Pain Scale: Hurts little more Pain Location: mid back Pain Descriptors / Indicators: Sore;Aching Pain Intervention(s): Limited activity within patient's tolerance;Monitored during session;Repositioned  Home Living                                          Prior Functioning/Environment              Frequency  Min 2X/week        Progress Toward Goals  OT Goals(current goals can now be found in the care plan section)  Progress towards OT goals: Progressing toward goals  Acute Rehab OT Goals Patient Stated Goal: none stated OT Goal Formulation: With patient Time For Goal Achievement: 02/21/20 Potential to Achieve Goals: Good  Plan Discharge plan remains appropriate    Co-evaluation                 AM-PAC OT "6 Clicks" Daily Activity     Outcome Measure   Help from another person eating meals?: None Help from another person taking care of personal grooming?: None Help from another person toileting, which includes using toliet, bedpan, or urinal?: A Little Help from another person  bathing (including washing, rinsing, drying)?: A Little Help from another person to put on and taking off regular upper body clothing?: None Help from another person to put on and taking off regular lower body clothing?: A Lot 6 Click Score: 20    End of Session Equipment Utilized During Treatment: Rolling walker  OT Visit Diagnosis: Unsteadiness on feet (R26.81);Muscle weakness (generalized) (M62.81)   Activity Tolerance Patient tolerated treatment well   Patient Left in bed;with call bell/phone within reach;with bed alarm set   Nurse Communication Mobility status;Other (comment)(soiled in urine; IV beeping)        Time: 4709-6283 OT Time Calculation (min): 26 min  Charges: OT General Charges $OT Visit: 1 Visit OT Treatments $Self Care/Home Management : 8-22 mins $Therapeutic Activity: 8-22 mins  Lanier Clam., COTA/L Acute Rehabilitation Services [phone] [place] 02/15/2020, 3:53 PM

## 2020-02-15 NOTE — Progress Notes (Signed)
[facility] PROGRESS NOTE    Progress Note  Brandon [name]  TFT:[id number] DOB: [birth year] DOA: 02/06/2020 PCP: Armando Gang, FNP     Brief Narrative:   Brandon [name] is an 54 y.o. male past medical history of alcohol abuse, CVA alcoholic cirrhosis, seizure disorder, chronic pancreatitis, uncontrolled diabetes mellitus presents to the hospital with generalized weakness and progressive back pain he relates that he has been having this back pain for about 3 months progressively getting worst CT scan of the abdomen and pelvis was notable for discitis on T9-T10 and T10 and 1011 with fluid collection noted posteriorly in the right pleural space, MRI of the T-spine confirmed discitis and osteomyelitis with an epidural abscess measuring about 5 mm causing moderate T9-T10 and severe T9 T11 spinal stenosis, large amounts of paravertebral phlegmon at T9 T11 level and empyema of the right posterior chest neurosurgery was consulted for evaluation.   Assessment/Plan:   Right pleural effusion: Status Post right chest removal imaging showed no pneumothorax. Fluid culture grew strep pneumo sensitive to Rocephin and penicillin. Ultrasound remained negative till date, continue IV Rocephin. Patient is awaiting skilled nursing facility placement.  Thoracic spine discitis, osteomyelitis with abscess: Neurosurgery and infectious disease are on board and appreciate assistance, probably strep pneumo discitis, continue IV Rocephin for 6 weeks. PICC line placed on 02/10/2020. Physical therapy evaluated the patient recommended skilled nursing facility.  Hypomagnesemia: Repleted, continue to monitor intermittently.  Alcohol dependence/abuse: He endorses withdrawal symptoms in the past. Discontinue Ativan protocol continue thiamine and folate.  Alcoholic cirrhosis: He continues to drink see above for further details.  Chronic pancreatitis: Incidental finding on MRI of the lumbar spine that  showed a hypodensity in the pancreas head, will need MRI of the pancreas as an outpatient for surveillance.  Essential hypertension: Metoprolol was resumed, his tachycardia resolved, blood pressure is adequate.  Diabetes mellitus type 2: With an A1c of 7.1, his Metformin was discontinued, will double the long-acting insulin continue sliding scale.  Blood glucose was started up to 400.  Hyperlipidemia: Excellent continue statins.  Seizure disorder: Continue Depakote.  Nicotine use: Continue nicotine patch.   DVT prophylaxis: lovenox Family Communication:none Status is: Inpatient  Remains inpatient appropriate because:Hemodynamically unstable   Dispo: The patient is from: Home              Anticipated d/c is to: Skilled nursing facility placement              Anticipated d/c date is: 2 days              Patient currently is medically stable to d/c.  Patient is currently stable for discharge, awaiting skilled nursing facility placement, will need 6 weeks of IV antibiotics  Code Status:     Code Status Orders  (From admission, onward)         Start     Ordered   02/06/20 2130  Full code  Continuous     02/06/20 2129        Code Status History    Date Active Date Inactive Code Status Order ID Comments User Context   03/12/2016 1817 03/29/2016 1546 Full Code [id number]  Lynnae Prude Inpatient   03/05/2016 2146 03/12/2016 1817 Full Code [id number]  Coralie Keens, MD ED   04/06/2015 1030 04/07/2015 1325 Full Code [id number]  Annice Needy, MD Inpatient   03/16/2015 1433 03/17/2015 1321 Full Code [id number]  Laurier Nancy, MD Inpatient   03/15/2015 1947  03/16/2015 1433 Full Code [id number]  Laurier Nancy, MD Inpatient   Advance Care Planning Activity    Advance Directive Documentation     Most Recent Value  Type of Advance Directive  Healthcare Power of Attorney  Pre-existing out of facility DNR order (yellow form or pink MOST form)  --  "MOST" Form in Place?   --        IV Access:    Peripheral IV   Procedures and diagnostic studies:   No results found.   Medical Consultants:    None.  Anti-Infectives:   vanc and rocephin  Subjective:    Brandon [name] no complaints.  Objective:    Vitals:   02/14/20 0802 02/14/20 0900 02/14/20 1609 02/14/20 2057  BP: (!) 65/48 117/80 (!) 152/105 122/75  Pulse: 73 71 72 81  Resp: 15 12 13 15   Temp: 98.2 F (36.8 C)  97.9 F (36.6 C) 98.6 F (37 C)  TempSrc: Oral  Oral Oral  SpO2: 98% 98% 99% 98%  Weight:      Height:       SpO2: 98 % O2 Flow Rate (L/min): 3 L/min   Intake/Output Summary (Last 24 hours) at 02/15/2020 0853 Last data filed at 02/15/2020 0830 Gross per 24 hour  Intake --  Output 701 ml  Net -701 ml   Filed Weights   02/06/20 2013  Weight: 81.6 kg    Exam: General exam: In no acute distress. Respiratory system: Good air movement and clear to auscultation, chest tube is out. Cardiovascular system: S1 & S2 heard, RRR. No JVD. Gastrointestinal system: Abdomen is nondistended, soft and nontender.  Extremities: No pedal edema. Skin: No rashes, lesions or ulcers  Data Reviewed:    Labs: Basic Metabolic Panel: Recent Labs  Lab 02/09/20 0330 02/09/20 0330 02/11/20 1128 02/11/20 1128 02/12/20 0410 02/13/20 0325  NA 133*  --  130*  --  135 136  K 4.4   < > 4.6   < > 4.4 4.5  CL 100  --  96*  --  100 101  CO2 26  --  27  --  29 28  GLUCOSE 172*  --  238*  --  64* 107*  BUN 13  --  16  --  17 16  CREATININE 0.54*  --  0.47*  --  0.50* 0.47*  CALCIUM 8.2*  --  8.9  --  9.0 8.7*  MG 2.1  --  1.8  --   --  1.8   < > = values in this interval not displayed.   GFR Estimated Creatinine Clearance: 114.5 mL/min (A) (by C-G formula based on SCr of 0.47 mg/dL (L)). Liver Function Tests: No results for input(s): AST, ALT, ALKPHOS, BILITOT, PROT, ALBUMIN in the last 168 hours. No results for input(s): LIPASE, AMYLASE in the last 168 hours. No results  for input(s): AMMONIA in the last 168 hours. Coagulation profile No results for input(s): INR, PROTIME in the last 168 hours. COVID-19 Labs  No results for input(s): DDIMER, FERRITIN, LDH, CRP in the last 72 hours.  Lab Results  Component Value Date   SARSCOV2NAA NEGATIVE 02/06/2020    CBC: Recent Labs  Lab 02/09/20 0330  WBC 6.8  HGB 9.2*  HCT 28.8*  MCV 98.0  PLT 343   Cardiac Enzymes: No results for input(s): CKTOTAL, CKMB, CKMBINDEX, TROPONINI in the last 168 hours. BNP (last 3 results) No results for input(s): PROBNP in the last 8760 hours. CBG: Recent  Labs  Lab 02/14/20 0636 02/14/20 1027 02/14/20 1244 02/14/20 1823 02/14/20 2316  GLUCAP 89 154* 188* 352* 142*   D-Dimer: No results for input(s): DDIMER in the last 72 hours. Hgb A1c: No results for input(s): HGBA1C in the last 72 hours. Lipid Profile: No results for input(s): CHOL, HDL, LDLCALC, TRIG, CHOLHDL, LDLDIRECT in the last 72 hours. Thyroid function studies: No results for input(s): TSH, T4TOTAL, T3FREE, THYROIDAB in the last 72 hours.  Invalid input(s): FREET3 Anemia work up: No results for input(s): VITAMINB12, FOLATE, FERRITIN, TIBC, IRON, RETICCTPCT in the last 72 hours. Sepsis Labs: Recent Labs  Lab 02/09/20 0330  WBC 6.8   Microbiology Recent Results (from the past 240 hour(s))  Urine culture     Status: None   Collection Time: 02/06/20  1:24 PM   Specimen: Urine, Random  Result Value Ref Range Status   Specimen Description URINE, RANDOM  Final   Special Requests NONE  Final   Culture   Final    NO GROWTH Performed at [facility], [address], [place], [place] [address]    Report Status 02/07/2020 FINAL  Final  Culture, blood (routine x 2)     Status: None   Collection Time: 02/06/20  5:05 PM   Specimen: BLOOD  Result Value Ref Range Status   Specimen Description BLOOD RIGHT ANTECUBITAL  Final   Special Requests   Final    BOTTLES DRAWN AEROBIC AND ANAEROBIC Blood  Culture adequate volume   Culture   Final    NO GROWTH 5 DAYS Performed at [facility], [place] [address], [place], [place] [address]    Report Status 02/11/2020 FINAL  Final  SARS Coronavirus 2 by RT PCR (hospital order, performed in [facility] hospital lab) Nasopharyngeal Nasopharyngeal Swab     Status: None   Collection Time: 02/06/20  6:13 PM   Specimen: Nasopharyngeal Swab  Result Value Ref Range Status   SARS Coronavirus 2 NEGATIVE NEGATIVE Final    Comment: (NOTE) SARS-CoV-2 target nucleic acids are NOT DETECTED. The SARS-CoV-2 RNA is generally detectable in upper and lower respiratory specimens during the acute phase of infection. The lowest concentration of SARS-CoV-2 viral copies this assay can detect is 250 copies / mL. A negative result does not preclude SARS-CoV-2 infection and should not be used as the sole basis for treatment or other patient management decisions.  A negative result may occur with improper specimen collection / handling, submission of specimen other than nasopharyngeal swab, presence of viral mutation(s) within the areas targeted by this assay, and inadequate number of viral copies (<250 copies / mL). A negative result must be combined with clinical observations, patient history, and epidemiological information. Fact Sheet for Patients:   StrictlyIdeas.no Fact Sheet for Healthcare Providers: BankingDealers.co.za This test is not yet approved or cleared  by the [place] FDA and has been authorized for detection and/or diagnosis of SARS-CoV-2 by FDA under an Emergency Use Authorization (EUA).  This EUA will remain in effect (meaning this test can be used) for the duration of the COVID-19 declaration under Section 564(b)(1) of the Act, 21 U.S.C. section 360bbb-3(b)(1), unless the authorization is terminated or revoked sooner. Performed at [facility], [place] [address] [place],  [place] [address]   Culture, blood (routine x 2)     Status: None   Collection Time: 02/06/20  8:40 PM   Specimen: BLOOD  Result Value Ref Range Status   Specimen Description BLOOD RIGHT  ANTECUBITAL  Final   Special Requests   Final    BOTTLES DRAWN AEROBIC AND ANAEROBIC Blood Culture adequate volume   Culture   Final    NO GROWTH 5 DAYS Performed at [facility], [address], [place], [place] [address]    Report Status 02/11/2020 FINAL  Final  MRSA PCR Screening     Status: None   Collection Time: 02/06/20 11:22 PM   Specimen: Nasal Mucosa; Nasopharyngeal  Result Value Ref Range Status   MRSA by PCR NEGATIVE NEGATIVE Final    Comment:        The GeneXpert MRSA Assay (FDA approved for NASAL specimens only), is one component of a comprehensive MRSA colonization surveillance program. It is not intended to diagnose MRSA infection nor to guide or monitor treatment for MRSA infections. Performed at [facility], [address], [place], [place] [address]   Aerobic/Anaerobic Culture (surgical/deep wound)     Status: None   Collection Time: 02/08/20  9:56 AM   Specimen: Abscess  Result Value Ref Range Status   Specimen Description ABSCESS RIGHT PLEURAL  Final   Special Requests NONE  Final   Gram Stain   Final    ABUNDANT WBC PRESENT, PREDOMINANTLY PMN ABUNDANT GRAM POSITIVE COCCI IN PAIRS IN CHAINS    Culture   Final    FEW STREPTOCOCCUS PNEUMONIAE NO ANAEROBES ISOLATED Performed at [facility], [address], [place], [place] [address]    Report Status 02/13/2020 FINAL  Final   Organism ID, Bacteria STREPTOCOCCUS PNEUMONIAE  Final      Susceptibility   Streptococcus pneumoniae - MIC*    ERYTHROMYCIN 2 RESISTANT Resistant     LEVOFLOXACIN <=0.25 SENSITIVE Sensitive     VANCOMYCIN 0.5 SENSITIVE Sensitive     PENO - penicillin 1      PENICILLIN (non-meningitis) 1 SENSITIVE Sensitive     PENICILLIN (oral) 1 INTERMEDIATE Intermediate     CEFTRIAXONE  (non-meningitis) 1 SENSITIVE Sensitive     * FEW STREPTOCOCCUS PNEUMONIAE     Medications:   . busPIRone  10 mg Oral BID  . Chlorhexidine Gluconate Cloth  6 each Topical Daily  . divalproex  500 mg Oral Daily  . docusate sodium  100 mg Oral BID  . famotidine  20 mg Oral BID  . feeding supplement (GLUCERNA SHAKE)  237 mL Oral TID BM  . feeding supplement (PRO-STAT SUGAR FREE 64)  30 mL Oral BID  . folic acid  1 mg Oral Daily  . insulin aspart  0-15 Units Subcutaneous TID WC  . insulin aspart  0-5 Units Subcutaneous QHS  . insulin aspart  4 Units Subcutaneous TID WC  . insulin detemir  20 Units Subcutaneous BID  . metoprolol tartrate  50 mg Oral BID  . multivitamin with minerals  1 tablet Oral Daily  . nicotine  21 mg Transdermal Daily  . simvastatin  20 mg Oral q1800  . sodium chloride flush  3 mL Intravenous Q12H  . thiamine  100 mg Oral Daily   Or  . thiamine  100 mg Intravenous Daily  . traZODone  150 mg Oral QHS   Continuous Infusions: . cefTRIAXone (ROCEPHIN)  IV 2 g (02/14/20 1241)      LOS: 9 days   Marinda Elk  [facility]  02/15/2020, 8:53 AM

## 2020-02-15 NOTE — TOC Progression Note (Signed)
Transition of Care [facility]) - Progression Note    Patient Details  Name: Brandon [name] MRN: [id number] Date of Birth: 1965/12/05  [facility] CM/SW Contact  Eduard Roux, [place] Phone Number: 02/15/2020, 11:38 AM  Clinical Narrative:     Patient has no bed offers.   Antony Blackbird, MSW, LCSWA Clinical Social Worker   Expected Discharge Plan: Home/Self Care Barriers to Discharge: Continued Medical Work up  Expected Discharge Plan and Services Expected Discharge Plan: Home/Self Care In-house Referral: Clinical Social Work Discharge Planning Services: CM Consult   Living arrangements for the past 2 months: Single Family Home                                       Social Determinants of Health (SDOH) Interventions    Readmission Risk Interventions No flowsheet data found.

## 2020-02-16 DIAGNOSIS — J90 Pleural effusion, not elsewhere classified: Secondary | ICD-10-CM

## 2020-02-16 DIAGNOSIS — M4640 Discitis, unspecified, site unspecified: Secondary | ICD-10-CM

## 2020-02-16 LAB — GLUCOSE, CAPILLARY
Glucose-Capillary: 110 mg/dL — ABNORMAL HIGH (ref 70–99)
Glucose-Capillary: 159 mg/dL — ABNORMAL HIGH (ref 70–99)
Glucose-Capillary: 72 mg/dL (ref 70–99)
Glucose-Capillary: 311 mg/dL — ABNORMAL HIGH (ref 70–99)

## 2020-02-16 MED ORDER — POLYETHYLENE GLYCOL 3350 17 G PO PACK
17.0000 g | PACK | Freq: Every day | ORAL | Status: DC
  Filled 2020-02-16: qty 1

## 2020-02-16 MED ORDER — SENNOSIDES-DOCUSATE SODIUM 8.6-50 MG PO TABS
1.0000 | ORAL_TABLET | Freq: Two times a day (BID) | ORAL | Status: DC
  Administered 2020-02-16 – 2020-02-18 (×3): 1 via ORAL
  Filled 2020-02-16 (×4): qty 1

## 2020-02-16 NOTE — Progress Notes (Signed)
Nutrition Follow-up  DOCUMENTATION CODES:   Not applicable  INTERVENTION:  Continue Glucerna Shake po TID, each supplement provides 220 kcal and 10 grams of protein  Continue30 ml Prostat po BID, each supplement provides 100 kcal and 15 grams of protein.  Encourage adequate PO intake.  NUTRITION DIAGNOSIS:   Increased nutrient needs related to chronic illness(cirrhosis, pancreatitis) as evidenced by estimated needs; ongoing  GOAL:   Patient will meet greater than or equal to 90% of their needs; progressing  MONITOR:   PO intake, Supplement acceptance, Skin, Weight trends, Labs, I & O's  REASON FOR ASSESSMENT:   Consult Assessment of nutrition requirement/status  ASSESSMENT:   54 y.o. male with medical history significant of EtOH abuse, CVA, Cirrhosis, seizure disorder, chronic pancreatitis, HTN, DM 2, HLD presents with generalized fatigue, abdominal pain, and back pain. Admitted for T9-T11 discitis and empyema. Per surgery, right sided pleural effusion is due to spinal abscess and not felt to be loculated empyema.   Meal completion has been 50-100%. Appetite has been good. Pt currently has Glucerna shake and Prostat ordered and has been consuming most of them. RD to continue with current orders to aid in caloric and protein needs.    Labs and medications reviewed.   Diet Order:   Diet Order            Diet heart healthy/carb modified Room service appropriate? Yes; Fluid consistency: Thin  Diet effective now              EDUCATION NEEDS:   Not appropriate for education at this time  Skin:  Skin Assessment: Reviewed RN Assessment  Last BM:  5/23  Height:   Ht Readings from Last 1 Encounters:  02/06/20 6' (1.829 m)    Weight:   Wt Readings from Last 1 Encounters:  02/06/20 81.6 kg    BMI:  Body mass index is 24.41 kg/m.  Estimated Nutritional Needs:   Kcal:  2200-2450  Protein:  110-125 grams  Fluid:  >/= 2 L/day   Roslyn Smiling, MS,  RD, LDN RD pager number/after hours weekend pager number on Amion.

## 2020-02-16 NOTE — Progress Notes (Signed)
Physical Therapy Treatment Patient Details Name: Brandon [name] MRN: [id number] DOB: [birth year] Today's Date: 02/16/2020    History of Present Illness Brandon [name] is a 54 year old Caucasian male with past medical history notable for EtOH abuse, CVA, EtOH cirrhosis, seizure disorder, chronic pancreatitis, HTN, type 2 diabetes mellitus, HLD with generalized fatigue, and progressive back pain.  Thought he had exacerbation of his chornic pancreatitis, MR T-spine notable for discitis/osteomyelitis at T9/10 and T10/11 with ventral epidural abscess measuring up to 5 mm in thickness and causing moderate T9/10 and severe T10/11 spinal canal stenosis. PICC line placed 5/19.    PT Comments    Patient progressing slowly, though reports more mobile getting up to bathroom in the room rather than using urinal as much.  Was sleeping earlier this pm (around noon) and requested PT return as he had just got to sleep.  Feel safest d/c plan remains SNF as only intermittent help at home.    Follow Up Recommendations  SNF;Supervision/Assistance - 24 hour     Equipment Recommendations  Rolling walker with 5" wheels    Recommendations for Other Services       Precautions / Restrictions Precautions Precautions: Fall    Mobility  Bed Mobility   Bed Mobility: Supine to Sit     Supine to sit: Supervision     General bed mobility comments: no physical assist needed; supervision for safety  Transfers Overall transfer level: Needs assistance Equipment used: Rolling walker (2 wheeled) Transfers: Sit to/from Stand Sit to Stand: Min guard         General transfer comment: assist for line management and safety with environmental set up  Ambulation/Gait Ambulation/Gait assistance: Min guard Gait Distance (Feet): 200 Feet Assistive device: Rolling walker (2 wheeled) Gait Pattern/deviations: Step-through pattern;Trunk flexed;Decreased stride length;Wide base of support     General Gait  Details: increased BOS noted throughout gait with some L foot toe down at initial contact   Stairs             Wheelchair Mobility    Modified Rankin (Stroke Patients Only)       Balance Overall balance assessment: Needs assistance   Sitting balance-Leahy Scale: Fair       Standing balance-Leahy Scale: Fair Standing balance comment: can static stand with no UE support                            Cognition Arousal/Alertness: Awake/alert Behavior During Therapy: Flat affect Overall Cognitive Status: Impaired/Different from baseline Area of Impairment: Problem solving;Safety/judgement;Awareness                         Safety/Judgement: Decreased awareness of deficits;Decreased awareness of safety     General Comments: reports walking to bathroom today as tired of getting the bed soiled      Exercises      General Comments        Pertinent Vitals/Pain Pain Assessment: No/denies pain Pain Intervention(s): Premedicated before session    Home Living                      Prior Function            PT Goals (current goals can now be found in the care plan section) Progress towards PT goals: Progressing toward goals    Frequency    Min 3X/week      PT Plan Current plan  remains appropriate    Co-evaluation              AM-PAC PT "6 Clicks" Mobility   Outcome Measure  Help needed turning from your back to your side while in a flat bed without using bedrails?: None Help needed moving from lying on your back to sitting on the side of a flat bed without using bedrails?: None Help needed moving to and from a bed to a chair (including a wheelchair)?: A Little Help needed standing up from a chair using your arms (e.g., wheelchair or bedside chair)?: A Little Help needed to walk in hospital room?: A Little Help needed climbing 3-5 steps with a railing? : A Little 6 Click Score: 20    End of Session   Activity  Tolerance: Patient tolerated treatment well Patient left: in chair;with call bell/phone within reach;with chair alarm set   PT Visit Diagnosis: Other abnormalities of gait and mobility (R26.89);Muscle weakness (generalized) (M62.81)     Time: 6203-5597 PT Time Calculation (min) (ACUTE ONLY): 19 min  Charges:  $Gait Training: 8-22 mins                     Magda Kiel, [place] [phone] 02/16/2020    Reginia Naas 02/16/2020, 5:28 PM

## 2020-02-16 NOTE — Progress Notes (Signed)
PROGRESS NOTE    [name]  KYH:[id number] DOB: 09/27/1965 DOA: 02/06/2020 PCP: Remi Haggard, FNP    Chief Complaint  Patient presents with  . Abdominal Pain  . Weakness    Brief Narrative:   Brandon [name] is an 54 y.o. male past medical history of alcohol abuse, CVA alcoholic cirrhosis, seizure disorder, chronic pancreatitis, uncontrolled diabetes mellitus presents to the hospital with generalized weakness and progressive back pain he relates that he has been having this back pain for about 3 months progressively getting worst CT scan of the abdomen and pelvis was notable for discitis on T9-T10 and T10 and 1011 with fluid collection noted posteriorly in the right pleural space, MRI of the T-spine confirmed discitis and osteomyelitis with an epidural abscess measuring about 5 mm causing moderate T9-T10 and severe T9 T11 spinal stenosis, large amounts of paravertebral phlegmon at T9 T11 level and empyema of the right posterior chest neurosurgery was consulted for evaluation  Subjective:  Tolerating iv abx Reports some abdominal pain, think he is being constipated  Assessment & Plan:   Active Problems:   Hypertension   GERD (gastroesophageal reflux disease)   Chronic Pancreatitis.   Hyperlipidemia   Smoking   CAD in native artery   Alcohol abuse   Tobacco abuse   Diabetes mellitus type 2 in nonobese (HCC)   History of CVA with residual deficit   Essential hypertension, benign   Diskitis   Alcohol withdrawal delirium (HCC)   Epilepsia ([place])   Pleural effusion on right   Empyema lung (HCC)   Epidural abscess   Right pleural effusion: Status Post right chest tube removal, post removal imaging showed no pneumothorax. Fluid culture grew strep pneumo sensitive to Rocephin and penicillin. Ultrasound remained negative till date, continue IV Rocephin. Patient is awaiting skilled nursing facility placement.  Thoracic spine discitis, osteomyelitis with  abscess: Neurosurgery and infectious disease are on board and appreciate assistance, probably strep pneumo discitis, continue IV Rocephin for 6 weeks. PICC line placed on 02/10/2020. Physical therapy evaluated the patient recommended skilled nursing facility.  Hypomagnesemia: Repleted, continue to monitor intermittently.  Alcohol dependence/abuse: He endorses withdrawal symptoms in the past. Discontinue Ativan protocol continue thiamine and folate.  Alcoholic cirrhosis: He continues to drink see above for further details.  Chronic pancreatitis: Incidental finding on MRI of the lumbar spine that showed a hypodensity in the pancreas head, will need MRI of the pancreas as an outpatient for surveillance. Report ab pain, will get lipase   Essential hypertension: Metoprolol was resumed, his tachycardia resolved, blood pressure is adequate.  Diabetes mellitus type 2: With an A1c of 7.1, his Metformin was discontinued, will double the long-acting insulin continue sliding scale.  Blood glucose was started up to 400.  Hyperlipidemia: Excellent continue statins.  Seizure disorder: Continue Depakote.  Nicotine use: Continue nicotine patch.  Nutrition Status: Nutrition Problem: Increased nutrient needs Etiology: chronic illness(cirrhosis, pancreatitis) Signs/Symptoms: estimated needs Interventions: Glucerna shake, Prostat     DVT prophylaxis: lovenox Code Status:full Family Communication: patient Disposition:   Status is: Inpatient     Dispo: The patient is from: home              Anticipated d/c is to: SNF              Anticipated d/c date is: medically stable to discharge, awaiting for placement,                 Consultants:   ID  IR  Thoracic surgery  neurosugery  Procedures:   Chest tube placement and removal  picc line placement  Antimicrobials:    rocephine    Objective: Vitals:   02/16/20 0905 02/16/20 1157 02/16/20 1648 02/16/20 2024   BP: (!) 148/91 122/71 129/75   Pulse: 73 73 67   Resp: 16 10 14    Temp: (!) 97.5 F (36.4 C) 97.7 F (36.5 C) 98.4 F (36.9 C) 98.2 F (36.8 C)  TempSrc: Oral Oral Oral Oral  SpO2: 100% 100% 100%   Weight:      Height:        Intake/Output Summary (Last 24 hours) at 02/16/2020 2104 Last data filed at 02/16/2020 02/18/2020 Gross per 24 hour  Intake 240 ml  Output 200 ml  Net 40 ml   Filed Weights   02/06/20 2013  Weight: 81.6 kg    Examination:  General exam: calm, NAD Respiratory system: Clear to auscultation. Respiratory effort normal. Cardiovascular system: S1 & S2 heard, RRR. No JVD, no murmur, No pedal edema. Gastrointestinal system: Abdomen is nondistended, soft and nontender. No organomegaly or masses felt. Normal bowel sounds heard. Central nervous system: Alert and oriented. No focal neurological deficits. Extremities: Symmetric 5 x 5 power. Skin: No rashes, lesions or ulcers Psychiatry: Judgement and insight appear normal. Mood & affect appropriate.     Data Reviewed: I have personally reviewed following labs and imaging studies  CBC: No results for input(s): WBC, NEUTROABS, HGB, HCT, MCV, PLT in the last 168 hours.  Basic Metabolic Panel: Recent Labs  Lab 02/11/20 1128 02/12/20 0410 02/13/20 0325  NA 130* 135 136  K 4.6 4.4 4.5  CL 96* 100 101  CO2 27 29 28   GLUCOSE 238* 64* 107*  BUN 16 17 16   CREATININE 0.47* 0.50* 0.47*  CALCIUM 8.9 9.0 8.7*  MG 1.8  --  1.8    GFR: Estimated Creatinine Clearance: 114.5 mL/min (A) (by C-G formula based on SCr of 0.47 mg/dL (L)).  Liver Function Tests: No results for input(s): AST, ALT, ALKPHOS, BILITOT, PROT, ALBUMIN in the last 168 hours.  CBG: Recent Labs  Lab 02/15/20 1609 02/15/20 2316 02/16/20 0905 02/16/20 1159 02/16/20 1646  GLUCAP 174* 289* 110* 159* 72     Recent Results (from the past 240 hour(s))  MRSA PCR Screening     Status: None   Collection Time: 02/06/20 11:22 PM   Specimen:  Nasal Mucosa; Nasopharyngeal  Result Value Ref Range Status   MRSA by PCR NEGATIVE NEGATIVE Final    Comment:        The GeneXpert MRSA Assay (FDA approved for NASAL specimens only), is one component of a comprehensive MRSA colonization surveillance program. It is not intended to diagnose MRSA infection nor to guide or monitor treatment for MRSA infections. Performed at [facility], [address], [place], [facility] [address]   Aerobic/Anaerobic Culture (surgical/deep wound)     Status: None   Collection Time: 02/08/20  9:56 AM   Specimen: Abscess  Result Value Ref Range Status   Specimen Description ABSCESS RIGHT PLEURAL  Final   Special Requests NONE  Final   Gram Stain   Final    ABUNDANT WBC PRESENT, PREDOMINANTLY PMN ABUNDANT GRAM POSITIVE COCCI IN PAIRS IN CHAINS    Culture   Final    FEW STREPTOCOCCUS PNEUMONIAE NO ANAEROBES ISOLATED Performed at [facility], [address] [place], [facility] [address]    Report Status 02/13/2020 FINAL  Final  Organism ID, Bacteria STREPTOCOCCUS PNEUMONIAE  Final      Susceptibility   Streptococcus pneumoniae - MIC*    ERYTHROMYCIN 2 RESISTANT Resistant     LEVOFLOXACIN <=0.25 SENSITIVE Sensitive     VANCOMYCIN 0.5 SENSITIVE Sensitive     PENO - penicillin 1      PENICILLIN (non-meningitis) 1 SENSITIVE Sensitive     PENICILLIN (oral) 1 INTERMEDIATE Intermediate     CEFTRIAXONE (non-meningitis) 1 SENSITIVE Sensitive     * FEW STREPTOCOCCUS PNEUMONIAE         Radiology Studies: No results found.      Scheduled Meds: . busPIRone  10 mg Oral BID  . Chlorhexidine Gluconate Cloth  6 each Topical Daily  . divalproex  500 mg Oral Daily  . famotidine  20 mg Oral BID  . feeding supplement (GLUCERNA SHAKE)  237 mL Oral TID BM  . feeding supplement (PRO-STAT SUGAR FREE 64)  30 mL Oral BID  . folic acid  1 mg Oral Daily  . insulin aspart  0-15 Units Subcutaneous TID WC  . insulin aspart  0-5 Units Subcutaneous  QHS  . insulin aspart  4 Units Subcutaneous TID WC  . insulin detemir  20 Units Subcutaneous BID  . metoprolol tartrate  50 mg Oral BID  . multivitamin with minerals  1 tablet Oral Daily  . nicotine  21 mg Transdermal Daily  . polyethylene glycol  17 g Oral Daily  . senna-docusate  1 tablet Oral BID  . simvastatin  20 mg Oral q1800  . sodium chloride flush  3 mL Intravenous Q12H  . thiamine  100 mg Oral Daily   Or  . thiamine  100 mg Intravenous Daily  . traZODone  150 mg Oral QHS   Continuous Infusions: . cefTRIAXone (ROCEPHIN)  IV 200 mL/hr at 02/16/20 1312     LOS: 10 days     Time spent: I have personally reviewed and interpreted on  02/16/2020 daily labs, tele strips, imagings as discussed above under date review session and assessment and plans.  I reviewed all nursing notes, pharmacy notes, consultant notes,  vitals, pertinent old records  I have discussed plan of care as described above with RN , patient  on 02/16/2020  Voice Recognition /Dragon dictation system was used to create this note, attempts have been made to correct errors. Please contact the author with questions and/or clarifications.   [name], MD PhD FACP [facility]  Available via Epic secure chat 7am-7pm for nonurgent issues Please page for urgent issues To page the attending provider between 7A-7P or the covering provider during after hours 7P-7A, please log into the web site [email or web address] and access using universal [place] password for that web site. If you do not have the password, please call the hospital operator.    02/16/2020, 9:04 PM

## 2020-02-17 LAB — CBC WITH DIFFERENTIAL/PLATELET
Abs Immature Granulocytes: 0.03 10*3/uL (ref 0.00–0.07)
Basophils Absolute: 0.1 10*3/uL (ref 0.0–0.1)
Basophils Relative: 1 %
Eosinophils Absolute: 0.1 10*3/uL (ref 0.0–0.5)
Eosinophils Relative: 1 %
HCT: 29.4 % — ABNORMAL LOW (ref 39.0–52.0)
Hemoglobin: 9.2 g/dL — ABNORMAL LOW (ref 13.0–17.0)
Immature Granulocytes: 0 %
Lymphocytes Relative: 27 %
Lymphs Abs: 2 10*3/uL (ref 0.7–4.0)
MCH: 30.9 pg (ref 26.0–34.0)
MCHC: 31.3 g/dL (ref 30.0–36.0)
MCV: 98.7 fL (ref 80.0–100.0)
Monocytes Absolute: 0.7 10*3/uL (ref 0.1–1.0)
Monocytes Relative: 9 %
Neutro Abs: 4.6 10*3/uL (ref 1.7–7.7)
Neutrophils Relative %: 62 %
Platelets: 494 10*3/uL — ABNORMAL HIGH (ref 150–400)
RBC: 2.98 MIL/uL — ABNORMAL LOW (ref 4.22–5.81)
RDW: 17.5 % — ABNORMAL HIGH (ref 11.5–15.5)
WBC: 7.4 10*3/uL (ref 4.0–10.5)
nRBC: 0 % (ref 0.0–0.2)

## 2020-02-17 LAB — COMPREHENSIVE METABOLIC PANEL
ALT: 15 U/L (ref 0–44)
AST: 20 U/L (ref 15–41)
Albumin: 2.6 g/dL — ABNORMAL LOW (ref 3.5–5.0)
Alkaline Phosphatase: 85 U/L (ref 38–126)
Anion gap: 10 (ref 5–15)
BUN: 20 mg/dL (ref 6–20)
CO2: 25 mmol/L (ref 22–32)
Calcium: 9.2 mg/dL (ref 8.9–10.3)
Chloride: 100 mmol/L (ref 98–111)
Creatinine, Ser: 0.57 mg/dL — ABNORMAL LOW (ref 0.61–1.24)
GFR calc Af Amer: >60 mL/min (ref >60)
GFR calc non Af Amer: >60 mL/min (ref >60)
Glucose, Bld: 77 mg/dL (ref 70–99)
Potassium: 4.5 mmol/L (ref 3.5–5.1)
Sodium: 135 mmol/L (ref 135–145)
Total Bilirubin: 0.3 mg/dL (ref 0.3–1.2)
Total Protein: 6.7 g/dL (ref 6.5–8.1)

## 2020-02-17 LAB — GLUCOSE, CAPILLARY
Glucose-Capillary: 72 mg/dL (ref 70–99)
Glucose-Capillary: 126 mg/dL — ABNORMAL HIGH (ref 70–99)
Glucose-Capillary: 166 mg/dL — ABNORMAL HIGH (ref 70–99)
Glucose-Capillary: 122 mg/dL — ABNORMAL HIGH (ref 70–99)

## 2020-02-17 LAB — MAGNESIUM: Magnesium: 1.8 mg/dL (ref 1.7–2.4)

## 2020-02-17 LAB — LIPASE, BLOOD: Lipase: 113 U/L — ABNORMAL HIGH (ref 11–51)

## 2020-02-17 NOTE — Progress Notes (Signed)
PROGRESS NOTE    Brandon [name]  MEQ:[id number] DOB: 1966/06/24 DOA: 02/06/2020 PCP: Armando Gang, FNP    Chief Complaint  Patient presents with  . Abdominal Pain  . Weakness    Brief Narrative:   Brandon [name] is an 54 y.o. male past medical history of alcohol abuse, CVA alcoholic cirrhosis, seizure disorder, chronic pancreatitis, uncontrolled diabetes mellitus presents to the hospital with generalized weakness and progressive back pain he relates that he has been having this back pain for about 3 months progressively getting worst CT scan of the abdomen and pelvis was notable for discitis on T9-T10 and T10 and 1011 with fluid collection noted posteriorly in the right pleural space, MRI of the T-spine confirmed discitis and osteomyelitis with an epidural abscess measuring about 5 mm causing moderate T9-T10 and severe T9 T11 spinal stenosis, large amounts of paravertebral phlegmon at T9 T11 level and empyema of the right posterior chest neurosurgery was consulted for evaluation  Subjective:  Tolerating iv abx Had solid bm yesterday evening, denies ab pain today Am lab not done yet  Assessment & Plan:   Active Problems:   Hypertension   GERD (gastroesophageal reflux disease)   Chronic Pancreatitis.   Hyperlipidemia   Smoking   CAD in native artery   Alcohol abuse   Tobacco abuse   Diabetes mellitus type 2 in nonobese (HCC)   History of CVA with residual deficit   Essential hypertension, benign   Diskitis   Alcohol withdrawal delirium (HCC)   Epilepsia (HCC)   Pleural effusion on right   Empyema lung (HCC)   Epidural abscess   Right pleural effusion: Status Post right chest tube removal, post removal imaging showed no pneumothorax. Fluid culture grew strep pneumo sensitive to Rocephin and penicillin. Ultrasound remained negative till date, continue IV Rocephin. Patient is awaiting skilled nursing facility placement.  Thoracic spine discitis, osteomyelitis  with abscess: Neurosurgery and infectious disease are on board and appreciate assistance, probably strep pneumo discitis, continue IV Rocephin for 6 weeks. PICC line placed on 02/10/2020. Physical therapy evaluated the patient recommended skilled nursing facility.  Hypomagnesemia: Repleted, continue to monitor intermittently.  Alcohol dependence/abuse: He endorses withdrawal symptoms in the past. Discontinue Ativan protocol continue thiamine and folate.  Alcoholic cirrhosis: He continues to drink see above for further details.  Chronic pancreatitis: Incidental finding on MRI of the lumbar spine that showed a hypodensity in the pancreas head, will need MRI of the pancreas as an outpatient for surveillance. Report ab pain, will get lipase   Essential hypertension: Metoprolol was resumed, his tachycardia resolved, blood pressure is adequate.  Diabetes mellitus type 2: With an A1c of 7.1, his Metformin was discontinued, will double the long-acting insulin continue sliding scale.  Blood glucose was started up to 400.  Hyperlipidemia: Excellent continue statins.  Seizure disorder: Continue Depakote.  Nicotine use: Continue nicotine patch.  Nutrition Status: Nutrition Problem: Increased nutrient needs Etiology: chronic illness(cirrhosis, pancreatitis) Signs/Symptoms: estimated needs Interventions: Glucerna shake, Prostat   Body mass index is 24.41 kg/m.   DVT prophylaxis: lovenox Code Status:full Family Communication: patient Disposition:   Status is: Inpatient     Dispo: The patient is from: home              Anticipated d/c is to: SNF              Anticipated d/c date is: medically stable to discharge, awaiting for placement,  Consultants:   ID  IR  Thoracic surgery  neurosugery  Procedures:   Chest tube placement and removal  picc line placement  Antimicrobials:    rocephine    Objective: Vitals:   02/17/20 0004  02/17/20 0006 02/17/20 0015 02/17/20 0453  BP:  127/81  (!) 95/59  Pulse:  72 73 72  Resp:  12 13 16   Temp: (!) 97.5 F (36.4 C) (!) 97.5 F (36.4 C) (!) 97.5 F (36.4 C) (!) 97.5 F (36.4 C)  TempSrc:  Oral  Oral  SpO2:  100% 100% 100%  Weight:      Height:        Intake/Output Summary (Last 24 hours) at 02/17/2020 0752 Last data filed at 02/16/2020 0909 Gross per 24 hour  Intake 240 ml  Output 200 ml  Net 40 ml   Filed Weights   02/06/20 2013  Weight: 81.6 kg    Examination:  General exam: calm, NAD Respiratory system: Clear to auscultation. Respiratory effort normal. Cardiovascular system: S1 & S2 heard, RRR. No JVD, no murmur, No pedal edema. Gastrointestinal system: Abdomen is nondistended, soft and nontender. No organomegaly or masses felt. Normal bowel sounds heard. Central nervous system: Alert and oriented. No focal neurological deficits. Extremities: Symmetric 5 x 5 power. Skin: No rashes, lesions or ulcers Psychiatry: Judgement and insight appear normal. Mood & affect appropriate.     Data Reviewed: I have personally reviewed following labs and imaging studies  CBC: No results for input(s): WBC, NEUTROABS, HGB, HCT, MCV, PLT in the last 168 hours.  Basic Metabolic Panel: Recent Labs  Lab 02/11/20 1128 02/12/20 0410 02/13/20 0325  NA 130* 135 136  K 4.6 4.4 4.5  CL 96* 100 101  CO2 27 29 28   GLUCOSE 238* 64* 107*  BUN 16 17 16   CREATININE 0.47* 0.50* 0.47*  CALCIUM 8.9 9.0 8.7*  MG 1.8  --  1.8    GFR: Estimated Creatinine Clearance: 114.5 mL/min (A) (by C-G formula based on SCr of 0.47 mg/dL (L)).  Liver Function Tests: No results for input(s): AST, ALT, ALKPHOS, BILITOT, PROT, ALBUMIN in the last 168 hours.  CBG: Recent Labs  Lab 02/15/20 2316 02/16/20 0905 02/16/20 1159 02/16/20 1646 02/16/20 2221  GLUCAP 289* 110* 159* 72 311*     Recent Results (from the past 240 hour(s))  Aerobic/Anaerobic Culture (surgical/deep wound)      Status: None   Collection Time: 02/08/20  9:56 AM   Specimen: Abscess  Result Value Ref Range Status   Specimen Description ABSCESS RIGHT PLEURAL  Final   Special Requests NONE  Final   Gram Stain   Final    ABUNDANT WBC PRESENT, PREDOMINANTLY PMN ABUNDANT GRAM POSITIVE COCCI IN PAIRS IN CHAINS    Culture   Final    FEW STREPTOCOCCUS PNEUMONIAE NO ANAEROBES ISOLATED Performed at [facility], [address], [place], [place] [address]    Report Status 02/13/2020 FINAL  Final   Organism ID, Bacteria STREPTOCOCCUS PNEUMONIAE  Final      Susceptibility   Streptococcus pneumoniae - MIC*    ERYTHROMYCIN 2 RESISTANT Resistant     LEVOFLOXACIN <=0.25 SENSITIVE Sensitive     VANCOMYCIN 0.5 SENSITIVE Sensitive     PENO - penicillin 1      PENICILLIN (non-meningitis) 1 SENSITIVE Sensitive     PENICILLIN (oral) 1 INTERMEDIATE Intermediate     CEFTRIAXONE (non-meningitis) 1 SENSITIVE Sensitive     * FEW STREPTOCOCCUS PNEUMONIAE  Radiology Studies: No results found.      Scheduled Meds: . busPIRone  10 mg Oral BID  . Chlorhexidine Gluconate Cloth  6 each Topical Daily  . divalproex  500 mg Oral Daily  . famotidine  20 mg Oral BID  . feeding supplement (GLUCERNA SHAKE)  237 mL Oral TID BM  . feeding supplement (PRO-STAT SUGAR FREE 64)  30 mL Oral BID  . folic acid  1 mg Oral Daily  . insulin aspart  0-15 Units Subcutaneous TID WC  . insulin aspart  0-5 Units Subcutaneous QHS  . insulin aspart  4 Units Subcutaneous TID WC  . insulin detemir  20 Units Subcutaneous BID  . metoprolol tartrate  50 mg Oral BID  . multivitamin with minerals  1 tablet Oral Daily  . nicotine  21 mg Transdermal Daily  . polyethylene glycol  17 g Oral Daily  . senna-docusate  1 tablet Oral BID  . simvastatin  20 mg Oral q1800  . sodium chloride flush  3 mL Intravenous Q12H  . thiamine  100 mg Oral Daily   Or  . thiamine  100 mg Intravenous Daily  . traZODone  150 mg Oral QHS    Continuous Infusions: . cefTRIAXone (ROCEPHIN)  IV 200 mL/hr at 02/16/20 1312     LOS: 11 days     Time spent: I have personally reviewed and interpreted on  02/17/2020 daily labs, tele strips, imagings as discussed above under date review session and assessment and plans.  I reviewed all nursing notes, pharmacy notes, consultant notes,  vitals, pertinent old records  I have discussed plan of care as described above with RN , patient  on 02/17/2020  Voice Recognition /Dragon dictation system was used to create this note, attempts have been made to correct errors. Please contact the author with questions and/or clarifications.   [name], MD PhD FACP [facility]  Available via Epic secure chat 7am-7pm for nonurgent issues Please page for urgent issues To page the attending provider between 7A-7P or the covering provider during after hours 7P-7A, please log into the web site [email or web address] and access using universal [place] password for that web site. If you do not have the password, please call the hospital operator.    02/17/2020, 7:52 AM

## 2020-02-17 NOTE — Progress Notes (Signed)
Physical Therapy Treatment Patient Details Name: Brandon [name] MRN: [id number] DOB: 1966-06-18 Today's Date: 02/17/2020    History of Present Illness Brandon [name] is a 54 year old Caucasian male with past medical history notable for EtOH abuse, CVA, EtOH cirrhosis, seizure disorder, chronic pancreatitis, HTN, type 2 diabetes mellitus, HLD with generalized fatigue, and progressive back pain.  Thought he had exacerbation of his chornic pancreatitis, MR T-spine notable for discitis/osteomyelitis at T9/10 and T10/11 with ventral epidural abscess measuring up to 5 mm in thickness and causing moderate T9/10 and severe T10/11 spinal canal stenosis. PICC line placed 5/19. Chest tube removal 5/21.    PT Comments    Pt tolerates treatment well although reluctant to participate initially. Pt demonstrates impaired awareness of deficits and is impulsive with mobility throughout session. Pt demonstrates gait deviations including antalgic gait on LLE with reduced knee flexion during the gait cycle. Pt also demonstrated impaired LE strenght and balance which increase the pt's falls risk. Pt will benefit from continued acute PT POC to reduce falls risk and to aide in a return to independent mobility. PT continues to recommend SNF at this time, if SNF placement is unable to be obtained the pt will benefit from HHPT with 24/7 assistance from family.  Follow Up Recommendations  SNF;Supervision/Assistance - 24 hour     Equipment Recommendations  Rolling walker with 5" wheels    Recommendations for Other Services       Precautions / Restrictions Precautions Precautions: Fall Restrictions Weight Bearing Restrictions: No    Mobility  Bed Mobility Overal bed mobility: Needs Assistance Bed Mobility: Supine to Sit;Rolling Rolling: Supervision   Supine to sit: Supervision        Transfers Overall transfer level: Needs assistance Equipment used: Rolling walker (2 wheeled) Transfers: Sit to/from  Stand Sit to Stand: Supervision         General transfer comment: pt impulsively stands from edge of bed  Ambulation/Gait Ambulation/Gait assistance: Min guard(minG progressing to close supervision) Gait Distance (Feet): 250 Feet Assistive device: Rolling walker (2 wheeled) Gait Pattern/deviations: Step-through pattern Gait velocity: reduced Gait velocity interpretation: <1.8 ft/sec, indicate of risk for recurrent falls General Gait Details: pt with shortened step through gait, pt with reduced L knee flexion througohut gait cycle, reporitng some pain in that LE. Pt also with slight increase in trunk flexion throughout ambulation   Stairs             Wheelchair Mobility    Modified Rankin (Stroke Patients Only)       Balance Overall balance assessment: Needs assistance Sitting-balance support: No upper extremity supported;Feet supported Sitting balance-Leahy Scale: Fair Sitting balance - Comments: close supervision 2/2 impulsivity   Standing balance support: Bilateral upper extremity supported Standing balance-Leahy Scale: Fair Standing balance comment: close supervision with UE support of RW                            Cognition Arousal/Alertness: Awake/alert Behavior During Therapy: Flat affect;Impulsive Overall Cognitive Status: Impaired/Different from baseline Area of Impairment: Awareness                         Safety/Judgement: Decreased awareness of safety;Decreased awareness of deficits Awareness: Intellectual Problem Solving: Slow processing        Exercises      General Comments General comments (skin integrity, edema, etc.): SpO2 with poor read throughout mobility, reporting sats in 60s and 70s although  pt is without increased work of breathing and denies SOB. Other VSS during session on RA      Pertinent Vitals/Pain Pain Assessment: Faces Faces Pain Scale: Hurts a little bit Pain Location: back Pain Descriptors /  Indicators: Aching Pain Intervention(s): Monitored during session    Home Living                      Prior Function            PT Goals (current goals can now be found in the care plan section) Acute Rehab PT Goals Patient Stated Goal: none stated Progress towards PT goals: Progressing toward goals    Frequency    Min 3X/week      PT Plan Current plan remains appropriate    Co-evaluation              AM-PAC PT "6 Clicks" Mobility   Outcome Measure  Help needed turning from your back to your side while in a flat bed without using bedrails?: None Help needed moving from lying on your back to sitting on the side of a flat bed without using bedrails?: None Help needed moving to and from a bed to a chair (including a wheelchair)?: None Help needed standing up from a chair using your arms (e.g., wheelchair or bedside chair)?: None Help needed to walk in hospital room?: A Little Help needed climbing 3-5 steps with a railing? : A Little 6 Click Score: 22    End of Session   Activity Tolerance: Patient tolerated treatment well Patient left: in chair;with call bell/phone within reach;with chair alarm set Nurse Communication: Mobility status PT Visit Diagnosis: Other abnormalities of gait and mobility (R26.89);Muscle weakness (generalized) (M62.81)     Time: 4287-6811 PT Time Calculation (min) (ACUTE ONLY): 16 min  Charges:  $Gait Training: 8-22 mins                     Zenaida Niece, PT, DPT Acute Rehabilitation Pager: [phone]    Zenaida Niece 02/17/2020, 5:10 PM

## 2020-02-17 NOTE — TOC Progression Note (Signed)
Transition of Care [facility]) - Progression Note    Patient Details  Name: Brandon [name] MRN: [id number] Date of Birth: 1966/09/22  Transition of Care [facility]) CM/SW Contact  Eduard Roux, [place] Phone Number: 02/17/2020, 11:44 AM  Clinical Narrative:     Patient has no ned offers CSW contacted [facility] to reconsider - waiting on response  Received PASRR # [id number] F authorized from 02/17/2020-03/18/20  Antony Blackbird, MSW, LCSWA Clinical Social Worker    Expected Discharge Plan: Home/Self Care Barriers to Discharge: Continued Medical Work up  Expected Discharge Plan and Services Expected Discharge Plan: Home/Self Care In-house Referral: Clinical Social Work Discharge Planning Services: CM Consult   Living arrangements for the past 2 months: Single Family Home                                       Social Determinants of Health (SDOH) Interventions    Readmission Risk Interventions No flowsheet data found.

## 2020-02-18 LAB — GLUCOSE, CAPILLARY
Glucose-Capillary: 107 mg/dL — ABNORMAL HIGH (ref 70–99)
Glucose-Capillary: 114 mg/dL — ABNORMAL HIGH (ref 70–99)
Glucose-Capillary: 156 mg/dL — ABNORMAL HIGH (ref 70–99)
Glucose-Capillary: 201 mg/dL — ABNORMAL HIGH (ref 70–99)
Glucose-Capillary: 69 mg/dL — ABNORMAL LOW (ref 70–99)
Glucose-Capillary: 84 mg/dL (ref 70–99)

## 2020-02-18 MED ORDER — TRAMADOL HCL 50 MG PO TABS: 50.0000 mg | ORAL_TABLET | Freq: Once | ORAL | Status: DC

## 2020-02-18 MED ORDER — INSULIN DETEMIR 100 UNIT/ML ~~LOC~~ SOLN
10.0000 [IU] | Freq: Two times a day (BID) | SUBCUTANEOUS | Status: DC
  Administered 2020-02-19: 10 [IU] via SUBCUTANEOUS
  Filled 2020-02-18 (×3): qty 0.1

## 2020-02-18 MED ORDER — INSULIN ASPART 100 UNIT/ML ~~LOC~~ SOLN
3.0000 [IU] | Freq: Three times a day (TID) | SUBCUTANEOUS | Status: DC
  Administered 2020-02-19 (×2): 3 [IU] via SUBCUTANEOUS

## 2020-02-18 MED ORDER — MAGNESIUM SULFATE 2 GM/50ML IV SOLN
2.0000 g | Freq: Once | INTRAVENOUS | Status: AC
  Administered 2020-02-18: 2 g via INTRAVENOUS
  Filled 2020-02-18: qty 50

## 2020-02-18 NOTE — Progress Notes (Addendum)
Occupational Therapy Treatment Patient Details Name: Brandon [name] MRN: [id number] DOB: 01-23-1966 Today's Date: 02/18/2020    History of present illness REVAN GENDRON is a 54 year old Caucasian male with past medical history notable for EtOH abuse, CVA, EtOH cirrhosis, seizure disorder, chronic pancreatitis, HTN, type 2 diabetes mellitus, HLD with generalized fatigue, and progressive back pain.  Thought he had exacerbation of his chornic pancreatitis, MR T-spine notable for discitis/osteomyelitis at T9/10 and T10/11 with ventral epidural abscess measuring up to 5 mm in thickness and causing moderate T9/10 and severe T10/11 spinal canal stenosis. PICC line placed 5/19. Chest tube removal 5/21.   OT comments  Pt making steady progress towards OT goals this session. Pt continues to c/o chronic back pain impacting pts ability to engage in BADLs. Session focus on community distance functional mobility with RW and standing ADLs. Overall, pt requires min guard for functional mobility with RW needing intermittent cues to manage RW as pt noted to run into obstacles on L side in hallway. Pt completed UB ADLs with supervision. Pt on RA throughout session with O2 WNL; RR increase to 33 with mobility tasks. Agree with DC plan below, will follow acutely for OT needs.    Follow Up Recommendations  No OT follow up;Supervision - Intermittent    Equipment Recommendations  None recommended by OT    Recommendations for Other Services      Precautions / Restrictions Precautions Precautions: Fall Restrictions Weight Bearing Restrictions: No       Mobility Bed Mobility Overal bed mobility: Modified Independent Bed Mobility: Supine to Sit     Supine to sit: Modified independent (Device/Increase time)     General bed mobility comments: no physical assist needed; use of bed features  Transfers Overall transfer level: Needs assistance Equipment used: Rolling walker (2 wheeled) Transfers: Sit  to/from Stand Sit to Stand: Supervision         General transfer comment: supervision for safety as pt can be impulsive    Balance Overall balance assessment: Needs assistance Sitting-balance support: No upper extremity supported;Feet supported Sitting balance-Leahy Scale: Fair     Standing balance support: During functional activity Standing balance-Leahy Scale: Fair Standing balance comment: able to reach for mask to don with no UE support or LOB                           ADL either performed or assessed with clinical judgement   ADL Overall ADL's : Needs assistance/impaired     Grooming: Oral care;Standing;Supervision/safety                   Toilet Transfer: Min guard;Ambulation;RW Toilet Transfer Details (indicate cue type and reason): simulated via functional mobility with RW         Functional mobility during ADLs: Min guard;Rolling walker General ADL Comments: pt continues to present with decreased activity tolerane and chronic pain, however pt agreeable to standing ADLs and functional mobility this sesssion. MIN Guard overall for functional mobility with RW     Vision       Perception     Praxis      Cognition Arousal/Alertness: Awake/alert Behavior During Therapy: Flat affect Overall Cognitive Status: Impaired/Different from baseline Area of Impairment: Awareness;Problem solving                           Awareness: Intellectual Problem Solving: Slow processing General Comments: pt continues to  present with flat affect however did communicate more today telling OTA anecdote about pts father.Oriented to year and month; unsure of date and day of week        Exercises     Shoulder Instructions       General Comments pt on RA throuhgout session with O2 100% at end of session. RR increased to 33 during mobility    Pertinent Vitals/ Pain       Pain Assessment: 0-10 Pain Score: 9  Pain Location: back Pain Descriptors  / Indicators: Discomfort Pain Intervention(s): Monitored during session;Repositioned;Patient requesting pain meds-RN notified  Home Living                                          Prior Functioning/Environment              Frequency  Min 2X/week        Progress Toward Goals  OT Goals(current goals can now be found in the care plan section)  Progress towards OT goals: Progressing toward goals  Acute Rehab OT Goals Patient Stated Goal: none stated OT Goal Formulation: With patient Time For Goal Achievement: 02/21/20 Potential to Achieve Goals: Good  Plan Discharge plan remains appropriate    Co-evaluation                 AM-PAC OT "6 Clicks" Daily Activity     Outcome Measure   Help from another person eating meals?: None Help from another person taking care of personal grooming?: None Help from another person toileting, which includes using toliet, bedpan, or urinal?: A Little Help from another person bathing (including washing, rinsing, drying)?: A Little Help from another person to put on and taking off regular upper body clothing?: None Help from another person to put on and taking off regular lower body clothing?: A Lot 6 Click Score: 20    End of Session Equipment Utilized During Treatment: Gait belt;Rolling walker  OT Visit Diagnosis: Unsteadiness on feet (R26.81);Muscle weakness (generalized) (M62.81)   Activity Tolerance Patient tolerated treatment well   Patient Left in bed;with call bell/phone within reach;with nursing/sitter in room;Other (comment)(sitting EOB; Rn present)   Nurse Communication Mobility status        Time: [phone] OT Time Calculation (min): 13 min  Charges: OT General Charges $OT Visit: 1 Visit OT Treatments $Self Care/Home Management : 8-22 mins  Audery Amel., COTA/L Acute Rehabilitation Services [phone]   Angelina Pih 02/18/2020, 3:05 PM

## 2020-02-18 NOTE — TOC Progression Note (Signed)
Transition of Care [facility]) - Progression Note    Patient Details  Name: Brandon [name] MRN: [id number] Date of Birth: [birth year]  Transition of Care [facility]) CM/SW Contact  Lockie Pares, RN Phone Number: 02/18/2020, 3:20 PM  Clinical Narrative:     patient admitted with discitis awaiting SNF placement, has not been accepted. PASSR now available, resent FL2 with PASSR to preferred SNF with [facility].  Hoping to get acceptance.  Will continue to follow for needs..    Expected Discharge Plan: Skilled Nursing Facility Barriers to Discharge: No SNF bed(PASSR pending)  Expected Discharge Plan and Services Expected Discharge Plan: Skilled Nursing Facility In-house Referral: Clinical Social Work Discharge Planning Services: CM Consult   Living arrangements for the past 2 months: Single Family Home                                       Social Determinants of Health (SDOH) Interventions    Readmission Risk Interventions No flowsheet data found.

## 2020-02-18 NOTE — Progress Notes (Addendum)
PROGRESS NOTE    Brandon [name]  XBJ:[id number] DOB: [birth year] DOA: 02/06/2020 PCP: Armando Gang, FNP    Chief Complaint  Patient presents with  . Abdominal Pain  . Weakness    Brief Narrative:   Brandon [name] is an 54 y.o. male past medical history of alcohol abuse, CVA alcoholic cirrhosis, seizure disorder, chronic pancreatitis, uncontrolled diabetes mellitus presents to the hospital with generalized weakness and progressive back pain he relates that he has been having this back pain for about 3 months progressively getting worst CT scan of the abdomen and pelvis was notable for discitis on T9-T10 and T10 and 1011 with fluid collection noted posteriorly in the right pleural space, MRI of the T-spine confirmed discitis and osteomyelitis with an epidural abscess measuring about 5 mm causing moderate T9-T10 and severe T9 T11 spinal stenosis, large amounts of paravertebral phlegmon at T9 T11 level and empyema of the right posterior chest neurosurgery was consulted for evaluation  Subjective:  Tolerating iv abx denies ab pain today Ambulated in hallway  Assessment & Plan:   Active Problems:   Hypertension   GERD (gastroesophageal reflux disease)   Chronic Pancreatitis.   Hyperlipidemia   Smoking   CAD in native artery   Alcohol abuse   Tobacco abuse   Diabetes mellitus type 2 in nonobese (HCC)   History of CVA with residual deficit   Essential hypertension, benign   Diskitis   Alcohol withdrawal delirium (HCC)   Epilepsia (HCC)   Pleural effusion on right   Empyema lung (HCC)   Epidural abscess   Right pleural effusion: Status Post right chest tube removal, post removal imaging showed no pneumothorax. Fluid culture grew strep pneumo sensitive to Rocephin and penicillin. Ultrasound remained negative till date, continue IV Rocephin. Patient is awaiting skilled nursing facility placement.  Thoracic spine discitis, osteomyelitis with abscess: Neurosurgery and  infectious disease are on board and appreciate assistance, probably strep pneumo discitis, continue IV Rocephin for 6 weeks. PICC line placed on 02/10/2020. Physical therapy evaluated the patient recommended skilled nursing facility.  Hypomagnesemia: Repleted, continue to monitor intermittently.  Alcohol dependence/abuse: He endorses withdrawal symptoms in the past. Discontinue Ativan protocol continue thiamine and folate.  Alcoholic cirrhosis: He continues to drink see above for further details.  Chronic pancreatitis: Incidental finding on MRI of the lumbar spine that showed a hypodensity in the pancreas head, will need MRI of the pancreas as an outpatient for surveillance. Lipase fluctuating, today denies ab pain, no n/v, eat 100% of his meals  Essential hypertension: Metoprolol was resumed, his tachycardia resolved, blood pressure is adequate.  Diabetes mellitus type 2: With an A1c of 7.1, home Metformin held in the hospital, he was not on insulin PTA Has hyperglycemia and hypoglycemia event in the hospital Decrease levemir, decrease meal coverage, continue ssi Continue adjust insulin   Hyperlipidemia: Excellent continue statins.  Seizure disorder: Continue Depakote.  Nicotine use: Continue nicotine patch.  Nutrition Status: Nutrition Problem: Increased nutrient needs Etiology: chronic illness(cirrhosis, pancreatitis) Signs/Symptoms: estimated needs Interventions: Glucerna shake, Prostat   Body mass index is 24.41 kg/m.   DVT prophylaxis: lovenox Code Status:full Family Communication: patient Disposition:   Status is: Inpatient   Dispo: The patient is from: home              Anticipated d/c is to: SNF              Anticipated d/c date is: 5/29  Consultants:   ID  IR  Thoracic surgery  neurosugery  Procedures:   Chest tube placement and removal  picc line placement for long term iv abx treatment  Antimicrobials:     rocephine    Objective: Vitals:   02/18/20 0321 02/18/20 0733 02/18/20 0934 02/18/20 1124  BP: 128/69 98/64 (!) 92/58 (!) 142/82  Pulse: 68 68 66 75  Resp: 16 12  13   Temp: 98.6 F (37 C) 98.6 F (37 C)  98.7 F (37.1 C)  TempSrc: Oral Oral  Oral  SpO2:  99%  100%  Weight:      Height:        Intake/Output Summary (Last 24 hours) at 02/18/2020 1729 Last data filed at 02/18/2020 1124 Gross per 24 hour  Intake 253 ml  Output 1250 ml  Net -997 ml   Filed Weights   02/06/20 2013  Weight: 81.6 kg    Examination:  General exam: calm, NAD, flat affect,  Respiratory system: Clear to auscultation. Respiratory effort normal. Cardiovascular system: S1 & S2 heard, RRR. No JVD, no murmur, No pedal edema. Gastrointestinal system: Abdomen is nondistended, soft and nontender. No organomegaly or masses felt. Normal bowel sounds heard. Central nervous system: Alert and oriented. No focal neurological deficits. Extremities: Symmetric 5 x 5 power. Skin: No rashes, lesions or ulcers Psychiatry: Judgement and insight appear normal. Mood & affect appropriate.     Data Reviewed: I have personally reviewed following labs and imaging studies  CBC: Recent Labs  Lab 02/17/20 0800  WBC 7.4  NEUTROABS 4.6  HGB 9.2*  HCT 29.4*  MCV 98.7  PLT 494*    Basic Metabolic Panel: Recent Labs  Lab 02/12/20 0410 02/13/20 0325 02/17/20 0800  NA 135 136 135  K 4.4 4.5 4.5  CL 100 101 100  CO2 29 28 25   GLUCOSE 64* 107* 77  BUN 17 16 20   CREATININE 0.50* 0.47* 0.57*  CALCIUM 9.0 8.7* 9.2  MG  --  1.8 1.8    GFR: Estimated Creatinine Clearance: 114.5 mL/min (A) (by C-G formula based on SCr of 0.57 mg/dL (L)).  Liver Function Tests: Recent Labs  Lab 02/17/20 0800  AST 20  ALT 15  ALKPHOS 85  BILITOT 0.3  PROT 6.7  ALBUMIN 2.6*    CBG: Recent Labs  Lab 02/17/20 2101 02/18/20 0037 02/18/20 0420 02/18/20 1123 02/18/20 1716  GLUCAP 122* 201* 114* 156* 69*     No  results found for this or any previous visit (from the past 240 hour(s)).       Radiology Studies: No results found.      Scheduled Meds: . busPIRone  10 mg Oral BID  . Chlorhexidine Gluconate Cloth  6 each Topical Daily  . divalproex  500 mg Oral Daily  . famotidine  20 mg Oral BID  . feeding supplement (GLUCERNA SHAKE)  237 mL Oral TID BM  . feeding supplement (PRO-STAT SUGAR FREE 64)  30 mL Oral BID  . folic acid  1 mg Oral Daily  . insulin aspart  0-15 Units Subcutaneous TID WC  . insulin aspart  0-5 Units Subcutaneous QHS  . [START ON 02/19/2020] insulin aspart  3 Units Subcutaneous TID WC  . insulin detemir  10 Units Subcutaneous BID  . metoprolol tartrate  50 mg Oral BID  . multivitamin with minerals  1 tablet Oral Daily  . nicotine  21 mg Transdermal Daily  . polyethylene glycol  17 g Oral Daily  . senna-docusate  1 tablet Oral BID  . simvastatin  20 mg Oral q1800  . sodium chloride flush  3 mL Intravenous Q12H  . thiamine  100 mg Oral Daily   Or  . thiamine  100 mg Intravenous Daily  . traZODone  150 mg Oral QHS   Continuous Infusions: . cefTRIAXone (ROCEPHIN)  IV 2 g (02/18/20 1414)     LOS: 12 days     Time spent: 58mins I have personally reviewed and interpreted on  02/18/2020 daily labs, tele strips, imagings as discussed above under date review session and assessment and plans.  I reviewed all nursing notes, pharmacy notes, consultant notes,  vitals, pertinent old records  I have discussed plan of care as described above with RN , patient  on 02/18/2020  Voice Recognition /Dragon dictation system was used to create this note, attempts have been made to correct errors. Please contact the author with questions and/or clarifications.   Florencia Reasons, MD PhD FACP [facility]  Available via Epic secure chat 7am-7pm for nonurgent issues Please page for urgent issues To page the attending provider between 7A-7P or the covering provider during after  hours 7P-7A, please log into the web site [email or web address] and access using universal [place] password for that web site. If you do not have the password, please call the hospital operator.    02/18/2020, 5:29 PM

## 2020-02-18 NOTE — Progress Notes (Signed)
Hypoglycemic Event  CBG: 69  Treatment: 4 oz juice/soda 8oz apple jice  Symptoms: Shaky  Follow-up CBG: Time:1743 CBG Result:107  Possible Reasons for Event: Unknown  Comments/MD notified:Pages [name] MD

## 2020-02-18 NOTE — TOC Progression Note (Signed)
Transition of Care [facility]) - Progression Note    Patient Details  Name: Brandon [name] MRN: [id number] Date of Birth: 1966-01-25  Transition of Care [facility]) CM/SW Contact  Eduard Roux, [place] Phone Number: 02/18/2020, 5:14 PM  Clinical Narrative:     Updated MD- patient has bee offer with [facility] - Per SNF/Chris they can admit patient tomorrow. CSW requested covid test.   5:00pm-CSW spoke with patient's sister, updated on discharge plan and the SNF process and expectations.   CSW visit with the patient to provide placement update. CSW informed [facility] is the only SNF that has offered at this point. CSW explained he must be willing to stay at least 30 days and potentially turn over his monthly disability income for June. CSW again advised he can not smoke at facility. CSW informed anticipated discharge is tomorrow.  Expected Discharge Plan: Skilled Nursing Facility Barriers to Discharge: No SNF bed(PASSR pending)  Expected Discharge Plan and Services Expected Discharge Plan: Skilled Nursing Facility In-house Referral: Clinical Social Work Discharge Planning Services: CM Consult   Living arrangements for the past 2 months: Single Family Home                                       Social Determinants of Health (SDOH) Interventions    Readmission Risk Interventions No flowsheet data found.

## 2020-02-19 DIAGNOSIS — K7030 Alcoholic cirrhosis of liver without ascites: Secondary | ICD-10-CM | POA: Diagnosis not present

## 2020-02-19 DIAGNOSIS — J90 Pleural effusion, not elsewhere classified: Secondary | ICD-10-CM | POA: Diagnosis not present

## 2020-02-19 DIAGNOSIS — M869 Osteomyelitis, unspecified: Secondary | ICD-10-CM | POA: Diagnosis not present

## 2020-02-19 DIAGNOSIS — Z720 Tobacco use: Secondary | ICD-10-CM | POA: Diagnosis not present

## 2020-02-19 DIAGNOSIS — R2689 Other abnormalities of gait and mobility: Secondary | ICD-10-CM | POA: Diagnosis not present

## 2020-02-19 DIAGNOSIS — F10131 Alcohol abuse with withdrawal delirium: Secondary | ICD-10-CM | POA: Diagnosis not present

## 2020-02-19 DIAGNOSIS — I2510 Atherosclerotic heart disease of native coronary artery without angina pectoris: Secondary | ICD-10-CM | POA: Diagnosis not present

## 2020-02-19 DIAGNOSIS — E119 Type 2 diabetes mellitus without complications: Secondary | ICD-10-CM | POA: Diagnosis not present

## 2020-02-19 DIAGNOSIS — F1010 Alcohol abuse, uncomplicated: Secondary | ICD-10-CM | POA: Diagnosis not present

## 2020-02-19 DIAGNOSIS — I10 Essential (primary) hypertension: Secondary | ICD-10-CM | POA: Diagnosis not present

## 2020-02-19 DIAGNOSIS — F1099 Alcohol use, unspecified with unspecified alcohol-induced disorder: Secondary | ICD-10-CM | POA: Diagnosis not present

## 2020-02-19 DIAGNOSIS — R5381 Other malaise: Secondary | ICD-10-CM | POA: Diagnosis not present

## 2020-02-19 DIAGNOSIS — R279 Unspecified lack of coordination: Secondary | ICD-10-CM | POA: Diagnosis not present

## 2020-02-19 DIAGNOSIS — F419 Anxiety disorder, unspecified: Secondary | ICD-10-CM | POA: Diagnosis not present

## 2020-02-19 DIAGNOSIS — I69898 Other sequelae of other cerebrovascular disease: Secondary | ICD-10-CM | POA: Diagnosis not present

## 2020-02-19 DIAGNOSIS — K219 Gastro-esophageal reflux disease without esophagitis: Secondary | ICD-10-CM | POA: Diagnosis not present

## 2020-02-19 DIAGNOSIS — E785 Hyperlipidemia, unspecified: Secondary | ICD-10-CM | POA: Diagnosis not present

## 2020-02-19 DIAGNOSIS — R5383 Other fatigue: Secondary | ICD-10-CM | POA: Diagnosis not present

## 2020-02-19 DIAGNOSIS — K860 Alcohol-induced chronic pancreatitis: Secondary | ICD-10-CM | POA: Diagnosis not present

## 2020-02-19 DIAGNOSIS — J869 Pyothorax without fistula: Secondary | ICD-10-CM | POA: Diagnosis not present

## 2020-02-19 DIAGNOSIS — F339 Major depressive disorder, recurrent, unspecified: Secondary | ICD-10-CM | POA: Diagnosis not present

## 2020-02-19 DIAGNOSIS — R1010 Upper abdominal pain, unspecified: Secondary | ICD-10-CM | POA: Diagnosis not present

## 2020-02-19 DIAGNOSIS — M6281 Muscle weakness (generalized): Secondary | ICD-10-CM | POA: Diagnosis not present

## 2020-02-19 DIAGNOSIS — G4089 Other seizures: Secondary | ICD-10-CM | POA: Diagnosis not present

## 2020-02-19 DIAGNOSIS — M4640 Discitis, unspecified, site unspecified: Secondary | ICD-10-CM | POA: Diagnosis not present

## 2020-02-19 DIAGNOSIS — G062 Extradural and subdural abscess, unspecified: Secondary | ICD-10-CM | POA: Diagnosis not present

## 2020-02-19 LAB — BASIC METABOLIC PANEL
Anion gap: 9 (ref 5–15)
BUN: 22 mg/dL — ABNORMAL HIGH (ref 6–20)
CO2: 25 mmol/L (ref 22–32)
Calcium: 8.8 mg/dL — ABNORMAL LOW (ref 8.9–10.3)
Chloride: 98 mmol/L (ref 98–111)
Creatinine, Ser: 0.55 mg/dL — ABNORMAL LOW (ref 0.61–1.24)
GFR calc Af Amer: >60 mL/min (ref >60)
GFR calc non Af Amer: >60 mL/min (ref >60)
Glucose, Bld: 135 mg/dL — ABNORMAL HIGH (ref 70–99)
Potassium: 6 mmol/L — ABNORMAL HIGH (ref 3.5–5.1)
Sodium: 132 mmol/L — ABNORMAL LOW (ref 135–145)

## 2020-02-19 LAB — SARS CORONAVIRUS 2 (TAT 6-24 HRS): SARS Coronavirus 2: NEGATIVE

## 2020-02-19 LAB — GLUCOSE, CAPILLARY
Glucose-Capillary: 143 mg/dL — ABNORMAL HIGH (ref 70–99)
Glucose-Capillary: 175 mg/dL — ABNORMAL HIGH (ref 70–99)
Glucose-Capillary: 185 mg/dL — ABNORMAL HIGH (ref 70–99)

## 2020-02-19 LAB — POTASSIUM: Potassium: 4.7 mmol/L (ref 3.5–5.1)

## 2020-02-19 LAB — MAGNESIUM: Magnesium: 2.2 mg/dL (ref 1.7–2.4)

## 2020-02-19 MED ORDER — TRAZODONE HCL 150 MG PO TABS: 150.0000 mg | ORAL_TABLET | Freq: Every day | ORAL | 0 refills | Status: DC

## 2020-02-19 MED ORDER — INSULIN ASPART 100 UNIT/ML ~~LOC~~ SOLN: 3.0000 [IU] | Freq: Three times a day (TID) | SUBCUTANEOUS | 11 refills | Status: DC

## 2020-02-19 MED ORDER — GLUCERNA SHAKE PO LIQD: 237.0000 mL | Freq: Three times a day (TID) | ORAL | 0 refills | Status: DC

## 2020-02-19 MED ORDER — FOLIC ACID 1 MG PO TABS: 1.0000 mg | ORAL_TABLET | Freq: Every day | ORAL | Status: DC

## 2020-02-19 MED ORDER — INSULIN DETEMIR 100 UNIT/ML ~~LOC~~ SOLN: 10.0000 [IU] | Freq: Two times a day (BID) | SUBCUTANEOUS | 11 refills | Status: DC

## 2020-02-19 MED ORDER — SENNOSIDES-DOCUSATE SODIUM 8.6-50 MG PO TABS: 1.0000 | ORAL_TABLET | Freq: Every day | ORAL | Status: DC

## 2020-02-19 MED ORDER — ALBUTEROL SULFATE (2.5 MG/3ML) 0.083% IN NEBU: 2.5000 mg | INHALATION_SOLUTION | Freq: Four times a day (QID) | RESPIRATORY_TRACT | 12 refills | Status: DC | PRN

## 2020-02-19 MED ORDER — THIAMINE HCL 100 MG PO TABS: 100.0000 mg | ORAL_TABLET | Freq: Every day | ORAL | Status: DC

## 2020-02-19 MED ORDER — INSULIN ASPART 100 UNIT/ML ~~LOC~~ SOLN: 0.0000 [IU] | Freq: Three times a day (TID) | SUBCUTANEOUS | 0 refills | Status: DC

## 2020-02-19 MED ORDER — METOPROLOL TARTRATE 50 MG PO TABS: 50.0000 mg | ORAL_TABLET | Freq: Two times a day (BID) | ORAL | Status: DC

## 2020-02-19 MED ORDER — ADULT MULTIVITAMIN W/MINERALS CH: 1.0000 | ORAL_TABLET | Freq: Every day | ORAL | Status: DC

## 2020-02-19 MED ORDER — CEFTRIAXONE IV (FOR PTA / DISCHARGE USE ONLY)
2.0000 g | INTRAVENOUS | 0 refills | Status: AC
Start: 2020-02-19 — End: 2020-03-31

## 2020-02-19 MED ORDER — PRO-STAT SUGAR FREE PO LIQD: 30.0000 mL | Freq: Two times a day (BID) | ORAL | 0 refills | Status: DC

## 2020-02-19 MED ORDER — DIVALPROEX SODIUM ER 500 MG PO TB24: 500.0000 mg | ORAL_TABLET | Freq: Every day | ORAL | Status: DC

## 2020-02-19 NOTE — Discharge Summary (Signed)
Discharge Summary  Brandon [name] YCX:[id number] DOB: 25-May-1966  PCP: Remi Haggard, FNP  Admit date: 02/06/2020 Discharge date: 02/19/2020  Time spent: 21mns, more than 50% time spent on coordination of care.   Recommendations for Outpatient Follow-up:  1. F/u with SNF MD  for hospital discharge follow up, repeat cbc/bmp at follow up 2. Follow-up with infectious disease Dr. SBaxter Flattery3. Follow-up with neurosurgery Dr. PTrenton Gammon 4.   will need MRI of the pancreas as an outpatient for surveillance of chronic pancreatitis    Discharge Diagnoses:  Active Hospital Problems   Diagnosis Date Noted  . Empyema lung ([facility]   . Epidural abscess   . Diskitis 02/06/2020  . Alcohol withdrawal delirium ([facility] 02/06/2020  . Epilepsia ([facility] 02/06/2020  . Pleural effusion on right 02/06/2020  . Essential hypertension, benign   . Tobacco abuse   . Diabetes mellitus type 2 in nonobese (HCC)   . History of CVA with residual deficit   . Alcohol abuse   . CAD in native artery 03/16/2015  . Smoking 06/16/2012  . Hyperlipidemia   . Hypertension 02/25/2011  . GERD (gastroesophageal reflux disease) 02/25/2011  . Chronic Pancreatitis. 02/25/2011    Resolved Hospital Problems  No resolved problems to display.    Discharge Condition: stable  Diet recommendation: heart healthy/carb modified  Filed Weights   02/06/20 2013  Weight: 81.6 kg    History of present illness: (Per admitting MD Dr. DRoel Cluck  Brandon MARRAZZOis a 54y.o. male with medical history significant of EtOH abuse, CVA, Cirrhosis, seizure disorder, chronic pancreatitis, HTN, DM 2, HLD    Presented with few weaks of generalized fatigue, back pain and worsening abdominal pain. States abdominal pain is what brought him to emergency department he was concerned he was having recurrence of pancreatitis. States his back pain has been bothering him for quite some time.  He resides with his sister.  She states that he has been  drinking more excessively lately. He walks very slow with a walker, he have had a CVA in the past He continues to drink a bout a pint a day. Last EToh was this AM This a.m. he is already feeling somewhat jittery and feeling like he is withdrawing from alcohol He did state that he has had seizures in the past No N/v/D denies any fevers or chills no chest pain or shortness of breath Patient continues to smoke and at this point not interested in quitting Has not been compliant his medications have not had his Plavix for the past 2 days   Hospital Course:  Active Problems:   Hypertension   GERD (gastroesophageal reflux disease)   Chronic Pancreatitis.   Hyperlipidemia   Smoking   CAD in native artery   Alcohol abuse   Tobacco abuse   Diabetes mellitus type 2 in nonobese (HCC)   History of CVA with residual deficit   Essential hypertension, benign   Diskitis   Alcohol withdrawal delirium (HCC)   [place]psia ([place]   Pleural effusion on right   Empyema lung (HCC)   Epidural abscess   Right pleural effusion: Status Post right chest tube removal, post removal imaging showed no pneumothorax. Fluid culture grew strep pneumo sensitive to Rocephin and penicillin. Ultrasound remained negative till date, continue IV Rocephin , last dose on 6/29. F/u with ID,  Pull picc line after finishing abx treatment.   skilled nursing facility placement.  Thoracic spine discitis, osteomyelitis with abscess:  probably strep pneumo discitis, continue  IV Rocephin for 6 weeks. PICC line placed on 02/10/2020.  skilled nursing facility. f/u with Neurosurgery and infectious disease  Hypomagnesemia: Repleted, continue to monitor intermittently.  Alcohol dependence/abuse: He endorses withdrawal symptoms in the past. No acute issues in the hospital  continue thiamine and folate.  Alcoholic cirrhosis: He continues to drink see above for further details.  Chronic pancreatitis: Incidental finding on  MRI of the lumbar spine that showed a hypodensity in the pancreas head, will need MRI of the pancreas as an outpatient for surveillance. Lipase fluctuating, he denies ab pain, no n/v, eat 100% of his meals  Essential hypertension: Metoprolol was resumed, his tachycardia resolved, blood pressure is adequate.  Diabetes mellitus type 2: With an A1c of 7.1, home Metformin held in the hospital, he was not on insulin PTA Has hyperglycemia and hypoglycemia event in the hospital He is discharged on  levemir, meal coverage, continue ssi and metformin Continue adjust insulin   Hyperlipidemia:Excellent continue statins.  H/o CVA: continue plavix/statin  Seizure disorder: Continue Depakote.  Nicotine use: Continue nicotine patch.  Nutrition Status: Nutrition Problem: Increased nutrient needs Etiology: chronic illness(cirrhosis, pancreatitis) Signs/Symptoms: estimated needs Interventions: Glucerna shake, Prostat   Body mass index is 24.41 kg/m.   DVT prophylaxis while in the hospital: lovenox Code Status:full Family Communication: patient Disposition:     Dispo: The patient is from: home  Anticipated d/c is to: SNF    Consultants:   ID  IR  Thoracic surgery  neurosugery  Procedures:   Chest tube placement and removal  picc line placement for long term iv abx treatment  Antimicrobials:    rocephine  Discharge Exam: BP 111/66 (BP Location: Left Arm)   Pulse 63   Temp 97.7 F (36.5 C)   Resp 20   Ht 6' (1.829 m)   Wt 81.6 kg   SpO2 95%   BMI 24.41 kg/m   General exam: calm, NAD, flat affect,  Respiratory system: Clear to auscultation. Respiratory effort normal. Cardiovascular system: S1 & S2 heard, RRR. No JVD, no murmur, No pedal edema. Gastrointestinal system: Abdomen is nondistended, soft and nontender. No organomegaly or masses felt. Normal bowel sounds heard. Central nervous system: Alert and  oriented. No focal neurological deficits. Extremities: Symmetric 5 x 5 power. Skin: No rashes, lesions or ulcers Psychiatry: Judgement and insight appear normal. Mood & affect appropriate.    Discharge Instructions You were cared for by a hospitalist during your hospital stay. If you have any questions about your discharge medications or the care you received while you were in the hospital after you are discharged, you can call the unit and asked to speak with the hospitalist on call if the hospitalist that took care of you is not available. Once you are discharged, your primary care physician will handle any further medical issues. Please note that NO REFILLS for any discharge medications will be authorized once you are discharged, as it is imperative that you return to your primary care physician (or establish a relationship with a primary care physician if you do not have one) for your aftercare needs so that they can reassess your need for medications and monitor your lab values.  Discharge Instructions    [facility] pharmacist to adjust dose for Vancomycin, Aminoglycosides and other anti-infective therapies as requested by physician.   Complete by: As directed    [facility] to provide Cath Flo '2mg'$    Complete by: As directed    Administer for PICC line occlusion  and as ordered by physician for other access device issues.   Anaphylaxis Kit: Provided to treat any anaphylactic reaction to the medication being provided to the patient if First Dose or when requested by physician   Complete by: As directed    Epinephrine '1mg'$ /ml vial / amp: Administer 0.'3mg'$  (0.28m) subcutaneously once for moderate to severe anaphylaxis, nurse to call physician and pharmacy when reaction occurs and call 911 if needed for immediate care   Diphenhydramine '50mg'$ /ml IV vial: Administer 25-'50mg'$  IV/IM PRN for first dose reaction, rash, itching, mild reaction, nurse to call physician and pharmacy when  reaction occurs   Sodium Chloride 0.9% NS 5045mIV: Administer if needed for hypovolemic blood pressure drop or as ordered by physician after call to physician with anaphylactic reaction   Change dressing on IV access line weekly and PRN   Complete by: As directed    Diet - low sodium heart healthy   Complete by: As directed    Carb modified   Flush IV access with Sodium Chloride 0.9% and Heparin 10 units/ml or 100 units/ml   Complete by: As directed    Home infusion instructions - [facility]   Complete by: As directed    Instructions: Flush IV access with Sodium Chloride 0.9% and Heparin 10units/ml or 100units/ml   Change dressing on IV access line: Weekly and PRN   Instructions Cath Flo '2mg'$ : Administer for PICC Line occlusion and as ordered by physician for other access device   [facility] pharmacist to adjust dose for: Vancomycin, Aminoglycosides and other anti-infective therapies as requested by physician   Increase activity slowly   Complete by: As directed    Method of administration may be changed at the discretion of home infusion pharmacist based upon assessment of the patient and/or caregiver's ability to self-administer the medication ordered   Complete by: As directed      Allergies as of 02/19/2020      Reactions   No Known Allergies       Medication List    STOP taking these medications   cyclobenzaprine 10 MG tablet Commonly known as: FLEXERIL   lisinopril 10 MG tablet Commonly known as: ZESTRIL     TAKE these medications   albuterol (2.5 MG/3ML) 0.083% nebulizer solution Commonly known as: PROVENTIL Take 3 mLs (2.5 mg total) by nebulization every 6 (six) hours as needed for wheezing or shortness of breath.   busPIRone 10 MG tablet Commonly known as: BUSPAR Take 10 mg by mouth 2 (two) times daily.   cefTRIAXone  IVPB Commonly known as: ROCEPHIN Inject 2 g into the vein daily. Indication:  Discitis, ventral epidural abcess First  Dose: no Last Day of Therapy:  03/21/20 Labs - Once weekly:  CBC/D and BMP, Labs - Every other week:  ESR and CRP Method of administration: IV Push Method of administration may be changed at the discretion of home infusion pharmacist based upon assessment of the patient and/or caregiver's ability to self-administer the medication ordered.   clopidogrel 75 MG tablet Commonly known as: PLAVIX Take 75 mg by mouth daily.   divalproex 500 MG 24 hr tablet Commonly known as: DEPAKOTE ER Take 1 tablet (500 mg total) by mouth daily. Start taking on: Feb 20, 2020 What changed:   how much to take  when to take this   famotidine 20 MG tablet Commonly known as: PEPCID Take 20 mg by mouth 2 (two) times daily.   feeding supplement (GLUCERNA SHAKE) Liqd Take  237 mLs by mouth 3 (three) times daily between meals.   feeding supplement (PRO-STAT SUGAR FREE 64) Liqd Take 30 mLs by mouth 2 (two) times daily.   folic acid 1 MG tablet Commonly known as: FOLVITE Take 1 tablet (1 mg total) by mouth daily. Start taking on: Feb 20, 2020   insulin aspart 100 UNIT/ML injection Commonly known as: novoLOG Inject 3 Units into the skin 3 (three) times daily with meals.   insulin aspart 100 UNIT/ML injection Commonly known as: novoLOG Inject 0-15 Units into the skin 3 (three) times daily with meals. Insulin sliding scale: Blood sugar  120-150   3units                       151-200   4units                       201-250   7units                       251- 300  11units                       301-350   15uints                       351-400   20units                       >400         call MD immediately   insulin detemir 100 UNIT/ML injection Commonly known as: LEVEMIR Inject 0.1 mLs (10 Units total) into the skin 2 (two) times daily.   metFORMIN 1000 MG tablet Commonly known as: GLUCOPHAGE Take 1 tablet (1,000 mg total) by mouth 2 (two) times daily with a meal.   metoprolol tartrate 50 MG  tablet Commonly known as: LOPRESSOR Take 1 tablet (50 mg total) by mouth 2 (two) times daily. What changed:   medication strength  how much to take  when to take this   multivitamin with minerals Tabs tablet Take 1 tablet by mouth daily. Start taking on: Feb 20, 2020   senna-docusate 8.6-50 MG tablet Commonly known as: Senokot-S Take 1 tablet by mouth at bedtime.   simvastatin 20 MG tablet Commonly known as: ZOCOR Take 20 mg by mouth daily at 6 PM.   thiamine 100 MG tablet Take 1 tablet (100 mg total) by mouth daily. Start taking on: Feb 20, 2020   traZODone 150 MG tablet Commonly known as: DESYREL Take 1 tablet (150 mg total) by mouth at bedtime.            Discharge Care Instructions  (From admission, onward)         Start     Ordered   02/19/20 0000  Change dressing on IV access line weekly and PRN  (Home infusion instructions - [facility] )     02/19/20 1101         Allergies  Allergen Reactions  . No Known Allergies    Follow-up Information    Carlyle Basques, MD Follow up on 03/09/2020.   Specialty: Infectious Diseases Why: 3:45 pm appointment with Dr. Baxter Flattery. Please arrive 15 minutes before your appointment to register.  Contact information: [address] [place] [address] [phone]        Remi Haggard, FNP Follow up.  Specialty: Family Medicine Contact information: [place] [address] [phone]        Earnie Larsson, MD Follow up.   Specialty: Neurosurgery Why: T9-T10-T11 osteomyelitis discitis Contact information: [address] [place] [address] [phone]            The results of significant diagnostics from this hospitalization (including imaging, microbiology, ancillary and laboratory) are listed below for reference.    Significant Diagnostic Studies: MR THORACIC SPINE W WO CONTRAST  Result Date: 02/06/2020 CLINICAL DATA:  Back  pain and suspected discitis EXAM: MRI THORACIC WITHOUT AND WITH CONTRAST TECHNIQUE: Multiplanar and multiecho pulse sequences of the thoracic spine were obtained without and with intravenous contrast. CONTRAST:  61m GADAVIST GADOBUTROL 1 MMOL/ML IV SOLN COMPARISON:  None. FINDINGS: MRI THORACIC SPINE FINDINGS Alignment:  Physiologic. Vertebrae: At T9, T10 and T11 there is hyperintense T2-weighted signal, loss of normal T1-weighted signal and diffuse contrast enhancement of the bone marrow. There is fluid within both disc spaces, worse at the lower level. Cord:  Normal signal and morphology. Paraspinal and other soft tissues: Empyema of the right posterior chest measures 4.7 cm. There is a large amount of paravertebral phlegmon at the T9-T11 levels. There is a ventral epidural abscess extending from T8-T11 that measures up to 5 mm in thickness. Disc levels: T9-10 and T10-11 discitis-osteomyelitis and epidural collection results in moderate T9-10 stenosis and severe T10-11 spinal canal stenosis. There is severe bilateral neural foraminal stenosis at both levels. The other thoracic disc spaces are unremarkable. IMPRESSION: 1. 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 There is severe bilateral neural foraminal stenosis at both levels. 2. Large amount of paravertebral phlegmon at the T9-T11 levels. 3. Empyema of the right posterior chest. Electronically Signed   By: KUlyses JarredM.D.   On: 02/06/2020 19:42   CT Abdomen Pelvis W Contrast  Result Date: 02/06/2020 CLINICAL DATA:  Prior abnormal chest x-ray with possible free air. EXAM: CT ABDOMEN AND PELVIS WITH CONTRAST TECHNIQUE: Multidetector CT imaging of the abdomen and pelvis was performed using the standard protocol following bolus administration of intravenous contrast. CONTRAST:  1023mOMNIPAQUE IOHEXOL 300 MG/ML  SOLN  COMPARISON:  None. FINDINGS: Lower chest: Lung bases demonstrate a loculated peripherally enhancing fluid collection in the posterior pleural space on the right. This corresponds to the density seen on recent plain film examination. Some associated compressive atelectasis is seen as well. Hepatobiliary: Gallbladder is well distended with multiple dependent gallbladder stones. The liver is mildly fatty infiltrated. Intrahepatic biliary ductal dilatation is seen which may be related to a distal common bile duct stone. Pancreas: Pancreas demonstrates diffuse calcifications as well as mild pancreatic ductal dilatation. Hypodensity is noted in the region of the head and uncinate process best seen on image number 28 of series 3. This is new from the prior exam and may represent a focal pancreatic head mass. It measures approximately 16 mm. Scattered calcifications are again seen similar to that noted on the prior exam consistent with prior chronic pancreatitis. Additionally previously seen left upper quadrant pseudocyst is noted but improved in size when compared with the prior exam. It lies predominately lateral to the spleen but courses along the anterior aspect of the spleen posterior to the stomach. Small pseudocyst is also noted adjacent to the head of the pancreas best seen on image number 30 of series 3.  Spleen: Within normal limits. Adrenals/Urinary Tract: Adrenal glands are stable in appearance. Mild fullness of the left adrenal gland is seen. The kidneys demonstrate a normal enhancement pattern bilaterally. No obstructive changes are seen. Right renal cyst is noted. The bladder is well distended. Stomach/Bowel: Colon shows no obstructive or inflammatory changes. The abnormality seen on recent chest x-ray in the right upper quadrant represents interposition of colon between the diaphragm and anterior aspect of the liver. No free air is seen. No small bowel abnormality is noted. The appendix is unremarkable.  Stomach is decompressed. Vascular/Lymphatic: Atherosclerotic calcifications of the abdominal aorta are noted. 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 This is likely related to the patient's previous pancreatitis episodes. 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 Reproductive: Prostate is unremarkable. Other: No free air or free fluid is noted. Musculoskeletal: There are changes at T9-T10 and T10-T11 with erosive changes of the endplates at both levels and perispinal inflammatory changes and enhancement consistent with discitis and paraspinal abscess. The loculated fluid collection on the right in the pleural space may be related to these changes as well. Degenerative changes of lumbar spine are seen. IMPRESSION: Changes highly suspicious for discitis at T10-T11 and T9-T10 with changes of paraspinal abscesses. The fluid collection loculated in the posterior right pleural space may represent sequelae from these changes as well. MRI of the thoracic spine with contrast is recommended for further evaluation. Changes consistent with chronic pancreatitis and persistent but smaller pseudocysts particularly surrounding the spleen and stomach. Hypodensity is noted in the region of the head/uncinate process as described above. This is somewhat suspicious for underlying mass given the new biliary ductal dilatation. MRI of the pancreas is recommended on an outpatient basis to allow for optimum imaging. Cavernous transformation of the portal vein with increase in the size of perigastric varices. No evidence of free air. The abnormality seen on prior chest x-ray is related to interposition of the colon between the liver in the diaphragm. Electronically Signed   By: Inez Catalina M.D.   On: 02/06/2020 16:06   DG Chest Portable 1 View  Result Date: 02/06/2020 CLINICAL DATA:  Cough EXAM: PORTABLE  CHEST 1 VIEW COMPARISON:  03/08/2016 FINDINGS: Cardiac shadow is within normal limits. The lungs are well aerated bilaterally. Rounded soft tissue density is noted adjacent to the right cardiac border. This was not present on a prior CT examination from 2017. This may represent a rounded pneumonia or possible pericardial cyst lungs are otherwise clear. There is apparent free air under the right hemidiaphragm. IMPRESSION: No focal infiltrate is seen. Findings suspicious for free air under the right hemidiaphragm. Soft tissue density along the right heart border of uncertain significance. CT of the abdomen and pelvis is recommended for further evaluation and characterization of these findings. Electronically Signed   By: Inez Catalina M.D.   On: 02/06/2020 14:16   ECHOCARDIOGRAM COMPLETE  Result Date: 02/07/2020    ECHOCARDIOGRAM REPORT   Patient Name:   Brandon [name] Date of Exam: 02/07/2020 Medical Rec #:  [id number]        Height:       72.0 in Accession #:    [id number]       Weight:       180.0 lb Date of Birth:  07-22-1966        BSA:          2.037 m Patient  Age:    75 years         BP:           96/71 mmHg Patient Gender: M                HR:           94 bpm. Exam Location:  Inpatient Procedure: 2D Echo Indications:    diskitis  History:        Patient has prior history of Echocardiogram examinations, most                 recent 03/07/2016. CAD; Risk Factors:Hypertension, Dyslipidemia                 and Current Smoker. CVA. alcohol dependency.  Sonographer:    Jannett Celestine RDCS (AE) Referring Phys: [place]  Sonographer Comments: poor positioning (see comments) IMPRESSIONS  1. Left ventricular ejection fraction, by estimation, is 55 to 60%. The left ventricle has normal function. The left ventricle has no regional wall motion abnormalities. Left ventricular diastolic parameters were normal.  2. Right ventricular systolic function is normal. The right ventricular size is normal.  3. Left  atrial size was mildly dilated.  4. The mitral valve is normal in structure. Trivial mitral valve regurgitation. No evidence of mitral stenosis.  5. The aortic valve is grossly normal. Aortic valve regurgitation is not visualized. Mild aortic valve sclerosis is present, with no evidence of aortic valve stenosis. Comparison(s): No significant change from prior study. FINDINGS  Left Ventricle: Left ventricular ejection fraction, by estimation, is 55 to 60%. The left ventricle has normal function. The left ventricle has no regional wall motion abnormalities. The left ventricular internal cavity size was normal in size. There is  no left ventricular hypertrophy. Left ventricular diastolic parameters were normal. Right Ventricle: The right ventricular size is normal. No increase in right ventricular wall thickness. Right ventricular systolic function is normal. Left Atrium: Left atrial size was mildly dilated. Right Atrium: Right atrial size was normal in size. Pericardium: There is no evidence of pericardial effusion. Mitral Valve: The mitral valve is normal in structure. Trivial mitral valve regurgitation. No evidence of mitral valve stenosis. Tricuspid Valve: The tricuspid valve is normal in structure. Tricuspid valve regurgitation is trivial. No evidence of tricuspid stenosis. Aortic Valve: The aortic valve is grossly normal. Aortic valve regurgitation is not visualized. Mild aortic valve sclerosis is present, with no evidence of aortic valve stenosis. There is mild calcification of the aortic valve. Pulmonic Valve: The pulmonic valve was not well visualized. Pulmonic valve regurgitation is not visualized. No evidence of pulmonic stenosis. Aorta: The aortic root is normal in size and structure. Venous: The inferior vena cava was not well visualized. IAS/Shunts: No atrial level shunt detected by color flow Doppler.  LEFT VENTRICLE PLAX 2D LVIDd:         4.40 cm  Diastology LVIDs:         3.10 cm  LV e' lateral:    11.90 cm/s LV PW:         1.10 cm  LV E/e' lateral: 6.6 LV IVS:        1.10 cm  LV e' medial:    7.94 cm/s LVOT diam:     2.20 cm  LV E/e' medial:  9.9 LV SV:         72 LV SV Index:   35 LVOT Area:     3.80 cm  RIGHT VENTRICLE RV S  prime:     20.80 cm/s TAPSE (M-mode): 2.0 cm LEFT ATRIUM             Index LA diam:        3.40 cm 1.67 cm/m LA Vol (A2C):   44.3 ml 21.74 ml/m LA Vol (A4C):   54.4 ml 26.68 ml/m LA Biplane Vol: 55.6 ml 27.29 ml/m  AORTIC VALVE LVOT Vmax:   77.00 cm/s LVOT Vmean:  59.700 cm/s LVOT VTI:    0.189 m  AORTA Ao Root diam: 3.70 cm MITRAL VALVE MV Area (PHT): 5.54 cm    SHUNTS MV Decel Time: 137 msec    Systemic VTI:  0.19 m MV E velocity: 78.40 cm/s  Systemic Diam: 2.20 cm MV A velocity: 85.70 cm/s MV E/A ratio:  0.91 Buford Dresser MD Electronically signed by Buford Dresser MD Signature Date/Time: 02/07/2020/1:51:11 PM    Final    IR US CHEST  Result Date: 02/07/2020 CLINICAL DATA:  Thoracic spondylo discitis, loculated right pleural collection concerning for empyema. EXAM: CHEST ULTRASOUND COMPARISON:  02/06/2020 FINDINGS: Ultrasound performed of the chest. Unfortunately unable to visualize the small loculated posteromedial fluid collection within the chest by ultrasound. Thoracentesis not performed. IMPRESSION: Unable to visualize the small loculated posteromedial right pleural fluid collection. Electronically Signed   By: Jerilynn Mages.  Shick M.D.   On: 02/07/2020 13:48   CT IMAGE GUIDED FLUID DRAIN BY CATHETER  Result Date: 02/08/2020 INDICATION: 54 year old male with a history right posterior chest empyema and associated osteomyelitis discitis. He has been referred for chest tube placement. EXAM: CT-GUIDED CHEST TUBE PLACEMENT, RIGHT EMPYEMA MEDICATIONS: The patient is currently admitted to the hospital and receiving intravenous antibiotics. The antibiotics were administered within an appropriate time frame prior to the initiation of the procedure. ANESTHESIA/SEDATION:  Fentanyl 50 mcg IV; Versed 0 mg IV Moderate Sedation Time:  None The patient was continuously monitored during the procedure by the interventional radiology nurse under my direct supervision. COMPLICATIONS: None PROCEDURE: Informed written consent was obtained from the patient after a thorough discussion of the procedural risks, benefits and alternatives. All questions were addressed. Maximal Sterile Barrier Technique was utilized including caps, mask, sterile gowns, sterile gloves, sterile drape, hand hygiene and skin antiseptic. A timeout was performed prior to the initiation of the procedure. Patient was positioned left decubitus position on the CT gantry table. Scout CT was acquired for planning purposes. After initial CT was performed the patient is prepped and draped in the usual sterile fashion. 1% lidocaine was used for local anesthesia. Yueh catheter was placed into the empyema with aspirate performed for culture. Modified Seldinger technique was then used to place a 12 [place] drain. Drain was attached to evacuation chamber. Drain was sutured in position. Final image was stored. Patient tolerated the procedure well and remained hemodynamically stable throughout. No complications were encountered and no significant blood loss. IMPRESSION: Status post CT-guided drain placement into right-sided empyema. Frank pus was aspirated and a culture was sent. Signed, Dulcy Fanny. Dellia Nims, RPVI [facility] Electronically Signed   By: Corrie Mckusick D.O.   On: 02/08/2020 11:19   [place] EKG SITE RITE  Result Date: 02/09/2020 If Site Rite image not attached, placement could not be confirmed due to current cardiac rhythm.   Microbiology: Recent Results (from the past 240 hour(s))  SARS CORONAVIRUS 2 (TAT 6-24 HRS) Nasopharyngeal Nasopharyngeal Swab     Status: None   Collection Time: 02/18/20  6:35 PM   Specimen: Nasopharyngeal  Swab  Result Value Ref Range  Status   SARS Coronavirus 2 NEGATIVE NEGATIVE Final    Comment: (NOTE) SARS-CoV-2 target nucleic acids are NOT DETECTED. The SARS-CoV-2 RNA is generally detectable in upper and lower respiratory specimens during the acute phase of infection. Negative results do not preclude SARS-CoV-2 infection, do not rule out co-infections with other pathogens, and should not be used as the sole basis for treatment or other patient management decisions. Negative results must be combined with clinical observations, patient history, and epidemiological information. The expected result is Negative. Fact Sheet for Patients: SugarRoll.be Fact Sheet for Healthcare Providers: https://www.woods-mathews.com/ This test is not yet approved or cleared by the [place] FDA and  has been authorized for detection and/or diagnosis of SARS-CoV-2 by FDA under an Emergency Use Authorization (EUA). This EUA will remain  in effect (meaning this test can be used) for the duration of the COVID-19 declaration under Section 56 4(b)(1) of the Act, 21 U.S.C. section 360bbb-3(b)(1), unless the authorization is terminated or revoked sooner. Performed at [facility], [place] [address] [place], [place] [address]      Labs: Basic Metabolic Panel: Recent Labs  Lab 02/13/20 0325 02/17/20 0800 02/19/20 0500 02/19/20 0734  NA 136 135 132*  --   K 4.5 4.5 6.0* 4.7  CL 101 100 98  --   CO2 '28 25 25  '$ --   GLUCOSE 107* 77 135*  --   BUN 16 20 22*  --   CREATININE 0.47* 0.57* 0.55*  --   CALCIUM 8.7* 9.2 8.8*  --   MG 1.8 1.8 2.2  --    Liver Function Tests: Recent Labs  Lab 02/17/20 0800  AST 20  ALT 15  ALKPHOS 85  BILITOT 0.3  PROT 6.7  ALBUMIN 2.6*   Recent Labs  Lab 02/17/20 0800  LIPASE 113*   No results for input(s): AMMONIA in the last 168 hours. CBC: Recent Labs  Lab 02/17/20 0800  WBC 7.4  NEUTROABS 4.6  HGB 9.2*  HCT 29.4*  MCV 98.7  PLT  494*   Cardiac Enzymes: No results for input(s): CKTOTAL, CKMB, CKMBINDEX, TROPONINI in the last 168 hours. BNP: BNP (last 3 results) No results for input(s): BNP in the last 8760 hours.  ProBNP (last 3 results) No results for input(s): PROBNP in the last 8760 hours.  CBG: Recent Labs  Lab 02/18/20 1716 02/18/20 1743 02/18/20 2031 02/19/20 0027 02/19/20 0334  GLUCAP 69* 107* 84 143* 185*       Signed:  Florencia Reasons MD, PhD, FACP  [facility] 02/19/2020, 11:21 AM

## 2020-02-19 NOTE — TOC Transition Note (Signed)
Transition of Care [facility]) - CM/SW Discharge Note   Patient Details  Name: Brandon [name] MRN: [id number] Date of Birth: 08-May-1966  Transition of Care [facility]) CM/SW Contact:  Annalee Genta, LCSW Phone Number: 02/19/2020, 12:50 PM   Clinical Narrative:    Patient will discharge to: [facility] Discharge date: 02/19/2020 Family notified: sister Transport by: Sharin Mons  Per MD patient is appropriate for discharge and will discharge to [facility]. RN, patient, patient's family, and facility have been notified of discharge. Assessment, FL-2, PASRR, and discharge summary sent to facillity. RN was provided with the following number for report: [phone]. PTAR will be used to transfer patient to the facility. CSW will continue to follow for any discharge supports.  Rosette Reveal, LCSW, LCAS Clinical Social Worker II Emergency Department, [facility] Transitions of Care department, [facility] [phone]      Barriers to Discharge: Barriers Resolved   Patient Goals and CMS Choice Patient states their goals for this hospitalization and ongoing recovery are:: return home      Discharge Placement                       Discharge Plan and Services In-house Referral: Clinical Social Work Discharge Planning Services: CM Consult                                 Social Determinants of Health (SDOH) Interventions     Readmission Risk Interventions No flowsheet data found.

## 2020-02-19 NOTE — TOC Progression Note (Signed)
Transition of Care [facility]) - Progression Note    Patient Details  Name: Brandon [name] MRN: [id number] Date of Birth: 12-Aug-1966  Transition of Care [facility]) CM/SW Contact  Eduard Roux, [place] Phone Number: 02/19/2020, 10:07 AM  Clinical Narrative:     [facility], to confirm admission today- waiting on response.  Antony Blackbird, MSW, LCSWA Clinical Social Worker   Expected Discharge Plan: Skilled Nursing Facility Barriers to Discharge: No SNF bed(PASSR pending)  Expected Discharge Plan and Services Expected Discharge Plan: Skilled Nursing Facility In-house Referral: Clinical Social Work Discharge Planning Services: CM Consult   Living arrangements for the past 2 months: Single Family Home                                       Social Determinants of Health (SDOH) Interventions    Readmission Risk Interventions No flowsheet data found.

## 2020-02-19 NOTE — Progress Notes (Signed)
   02/19/20 1330  Hand-Off documentation  Handoff Given Given to Transfer Unit/facility  Report given to (Full Name) Tiffany Hawks [facility])  Handoff Received Received from shift RN/LPN  Report received from (Full Name) Radford Pax    Received discharge order for patient. Discharge instructions placed in packet and given to [facility] personnel. Pt stable in no acute distress. R upper arm PICC intact, secured, and flushed with blood return noted without resistance. Pt off unit in stretcher a this time. Nursing care complete.

## 2020-02-21 DIAGNOSIS — E7800 Pure hypercholesterolemia, unspecified: Secondary | ICD-10-CM | POA: Diagnosis not present

## 2020-02-21 DIAGNOSIS — M2550 Pain in unspecified joint: Secondary | ICD-10-CM | POA: Diagnosis not present

## 2020-02-21 DIAGNOSIS — D649 Anemia, unspecified: Secondary | ICD-10-CM | POA: Diagnosis not present

## 2020-02-21 LAB — GLUCOSE, CAPILLARY
Glucose-Capillary: 96 mg/dL (ref 70–99)
Glucose-Capillary: 132 mg/dL — ABNORMAL HIGH (ref 70–99)

## 2020-02-22 DIAGNOSIS — E785 Hyperlipidemia, unspecified: Secondary | ICD-10-CM | POA: Diagnosis not present

## 2020-02-22 DIAGNOSIS — M4640 Discitis, unspecified, site unspecified: Secondary | ICD-10-CM | POA: Diagnosis not present

## 2020-02-22 DIAGNOSIS — F339 Major depressive disorder, recurrent, unspecified: Secondary | ICD-10-CM | POA: Diagnosis not present

## 2020-02-22 DIAGNOSIS — M6281 Muscle weakness (generalized): Secondary | ICD-10-CM | POA: Diagnosis not present

## 2020-02-22 DIAGNOSIS — I69898 Other sequelae of other cerebrovascular disease: Secondary | ICD-10-CM | POA: Diagnosis not present

## 2020-02-22 DIAGNOSIS — K219 Gastro-esophageal reflux disease without esophagitis: Secondary | ICD-10-CM | POA: Diagnosis not present

## 2020-02-22 DIAGNOSIS — R279 Unspecified lack of coordination: Secondary | ICD-10-CM | POA: Diagnosis not present

## 2020-02-22 DIAGNOSIS — K7030 Alcoholic cirrhosis of liver without ascites: Secondary | ICD-10-CM | POA: Diagnosis not present

## 2020-02-22 DIAGNOSIS — J869 Pyothorax without fistula: Secondary | ICD-10-CM | POA: Diagnosis not present

## 2020-02-22 DIAGNOSIS — I10 Essential (primary) hypertension: Secondary | ICD-10-CM | POA: Diagnosis not present

## 2020-02-22 DIAGNOSIS — F419 Anxiety disorder, unspecified: Secondary | ICD-10-CM | POA: Diagnosis not present

## 2020-02-22 DIAGNOSIS — E119 Type 2 diabetes mellitus without complications: Secondary | ICD-10-CM | POA: Diagnosis not present

## 2020-02-22 DIAGNOSIS — R5383 Other fatigue: Secondary | ICD-10-CM | POA: Diagnosis not present

## 2020-02-22 DIAGNOSIS — R1010 Upper abdominal pain, unspecified: Secondary | ICD-10-CM | POA: Diagnosis not present

## 2020-02-22 DIAGNOSIS — R5381 Other malaise: Secondary | ICD-10-CM | POA: Diagnosis not present

## 2020-02-22 DIAGNOSIS — I2510 Atherosclerotic heart disease of native coronary artery without angina pectoris: Secondary | ICD-10-CM | POA: Diagnosis not present

## 2020-02-22 DIAGNOSIS — F10131 Alcohol abuse with withdrawal delirium: Secondary | ICD-10-CM | POA: Diagnosis not present

## 2020-02-22 DIAGNOSIS — K860 Alcohol-induced chronic pancreatitis: Secondary | ICD-10-CM | POA: Diagnosis not present

## 2020-02-22 DIAGNOSIS — M869 Osteomyelitis, unspecified: Secondary | ICD-10-CM | POA: Diagnosis not present

## 2020-02-22 DIAGNOSIS — G4089 Other seizures: Secondary | ICD-10-CM | POA: Diagnosis not present

## 2020-02-22 DIAGNOSIS — G062 Extradural and subdural abscess, unspecified: Secondary | ICD-10-CM | POA: Diagnosis not present

## 2020-02-22 DIAGNOSIS — Z720 Tobacco use: Secondary | ICD-10-CM | POA: Diagnosis not present

## 2020-02-22 DIAGNOSIS — R2689 Other abnormalities of gait and mobility: Secondary | ICD-10-CM | POA: Diagnosis not present

## 2020-02-22 DIAGNOSIS — J90 Pleural effusion, not elsewhere classified: Secondary | ICD-10-CM | POA: Diagnosis not present

## 2020-02-22 DIAGNOSIS — F1099 Alcohol use, unspecified with unspecified alcohol-induced disorder: Secondary | ICD-10-CM | POA: Diagnosis not present

## 2020-02-28 DIAGNOSIS — D649 Anemia, unspecified: Secondary | ICD-10-CM | POA: Diagnosis not present

## 2020-02-29 DIAGNOSIS — E785 Hyperlipidemia, unspecified: Secondary | ICD-10-CM | POA: Diagnosis not present

## 2020-02-29 DIAGNOSIS — I10 Essential (primary) hypertension: Secondary | ICD-10-CM | POA: Diagnosis not present

## 2020-02-29 DIAGNOSIS — K219 Gastro-esophageal reflux disease without esophagitis: Secondary | ICD-10-CM | POA: Diagnosis not present

## 2020-02-29 DIAGNOSIS — E119 Type 2 diabetes mellitus without complications: Secondary | ICD-10-CM | POA: Diagnosis not present

## 2020-03-01 DIAGNOSIS — D649 Anemia, unspecified: Secondary | ICD-10-CM | POA: Diagnosis not present

## 2020-03-01 DIAGNOSIS — R569 Unspecified convulsions: Secondary | ICD-10-CM | POA: Diagnosis not present

## 2020-03-01 DIAGNOSIS — E039 Hypothyroidism, unspecified: Secondary | ICD-10-CM | POA: Diagnosis not present

## 2020-03-01 DIAGNOSIS — E119 Type 2 diabetes mellitus without complications: Secondary | ICD-10-CM | POA: Diagnosis not present

## 2020-03-01 DIAGNOSIS — E785 Hyperlipidemia, unspecified: Secondary | ICD-10-CM | POA: Diagnosis not present

## 2020-03-06 DIAGNOSIS — D649 Anemia, unspecified: Secondary | ICD-10-CM | POA: Diagnosis not present

## 2020-03-06 DIAGNOSIS — E7800 Pure hypercholesterolemia, unspecified: Secondary | ICD-10-CM | POA: Diagnosis not present

## 2020-03-06 DIAGNOSIS — M2550 Pain in unspecified joint: Secondary | ICD-10-CM | POA: Diagnosis not present

## 2020-03-07 DIAGNOSIS — I10 Essential (primary) hypertension: Secondary | ICD-10-CM | POA: Diagnosis not present

## 2020-03-07 DIAGNOSIS — K219 Gastro-esophageal reflux disease without esophagitis: Secondary | ICD-10-CM | POA: Diagnosis not present

## 2020-03-07 DIAGNOSIS — E785 Hyperlipidemia, unspecified: Secondary | ICD-10-CM | POA: Diagnosis not present

## 2020-03-07 DIAGNOSIS — E119 Type 2 diabetes mellitus without complications: Secondary | ICD-10-CM | POA: Diagnosis not present

## 2020-03-09 ENCOUNTER — Inpatient Hospital Stay: Payer: Medicaid Other | Admitting: Internal Medicine

## 2020-03-13 DIAGNOSIS — D649 Anemia, unspecified: Secondary | ICD-10-CM | POA: Diagnosis not present

## 2020-03-14 DIAGNOSIS — E119 Type 2 diabetes mellitus without complications: Secondary | ICD-10-CM | POA: Diagnosis not present

## 2020-03-14 DIAGNOSIS — I10 Essential (primary) hypertension: Secondary | ICD-10-CM | POA: Diagnosis not present

## 2020-03-14 DIAGNOSIS — K219 Gastro-esophageal reflux disease without esophagitis: Secondary | ICD-10-CM | POA: Diagnosis not present

## 2020-03-14 DIAGNOSIS — E785 Hyperlipidemia, unspecified: Secondary | ICD-10-CM | POA: Diagnosis not present

## 2020-03-15 ENCOUNTER — Other Ambulatory Visit: Payer: Self-pay

## 2020-03-15 ENCOUNTER — Ambulatory Visit (INDEPENDENT_AMBULATORY_CARE_PROVIDER_SITE_OTHER): Payer: Medicaid Other | Admitting: Internal Medicine

## 2020-03-15 ENCOUNTER — Encounter: Payer: Self-pay | Admitting: Internal Medicine

## 2020-03-15 VITALS — BP 113/79 | HR 65 | Temp 97.4°F | Wt 159.0 lb

## 2020-03-15 DIAGNOSIS — M4644 Discitis, unspecified, thoracic region: Secondary | ICD-10-CM | POA: Diagnosis not present

## 2020-03-15 DIAGNOSIS — A491 Streptococcal infection, unspecified site: Secondary | ICD-10-CM | POA: Diagnosis not present

## 2020-03-15 DIAGNOSIS — G061 Intraspinal abscess and granuloma: Secondary | ICD-10-CM | POA: Diagnosis not present

## 2020-03-15 MED ORDER — CEPHALEXIN 500 MG PO CAPS: 500.0000 mg | ORAL_CAPSULE | Freq: Four times a day (QID) | ORAL | 1 refills | Status: DC

## 2020-03-15 NOTE — Progress Notes (Signed)
RFV: follow up for hospitalization  Patient ID: Brandon [name], male   DOB: 21-Jul-1966, 54 y.o.   MRN: [id number]  HPI  Brandon [name] is a 54 y.o. male with penicillin intermediate-strep pneumoniae empyema, discitis, ventral epidural abscess treated with ceftriaxone through June 29th- roughly 6 wk. Has some improvement with back pain. Still issues with abtx via picc line. Has unintentional weight loss, hasn't felt that he has had adequate nutrition at snf  Lives with sister   Outpatient Encounter Medications as of 03/15/2020  Medication Sig  . albuterol (PROVENTIL) (2.5 MG/3ML) 0.083% nebulizer solution Take 3 mLs (2.5 mg total) by nebulization every 6 (six) hours as needed for wheezing or shortness of breath.  . Amino Acids-Protein Hydrolys (FEEDING SUPPLEMENT, PRO-STAT SUGAR FREE 64,) LIQD Take 30 mLs by mouth 2 (two) times daily.  . busPIRone (BUSPAR) 10 MG tablet Take 10 mg by mouth 2 (two) times daily.  . cefTRIAXone (ROCEPHIN) IVPB Inject 2 g into the vein daily. Indication:  Discitis, ventral epidural abcess First Dose: no Last Day of Therapy:  03/21/20 Labs - Once weekly:  CBC/D and BMP, Labs - Every other week:  ESR and CRP Method of administration: IV Push Method of administration may be changed at the discretion of home infusion pharmacist based upon assessment of the patient and/or caregiver's ability to self-administer the medication ordered.  . clopidogrel (PLAVIX) 75 MG tablet Take 75 mg by mouth daily.   . divalproex (DEPAKOTE ER) 500 MG 24 hr tablet Take 1 tablet (500 mg total) by mouth daily.  . famotidine (PEPCID) 20 MG tablet Take 20 mg by mouth 2 (two) times daily.   . feeding supplement, GLUCERNA SHAKE, (GLUCERNA SHAKE) LIQD Take 237 mLs by mouth 3 (three) times daily between meals.  . folic acid (FOLVITE) 1 MG tablet Take 1 tablet (1 mg total) by mouth daily.  . insulin aspart (NOVOLOG) 100 UNIT/ML injection Inject 3 Units into the skin 3 (three) times daily  with meals.  . insulin aspart (NOVOLOG) 100 UNIT/ML injection Inject 0-15 Units into the skin 3 (three) times daily with meals. Insulin sliding scale: Blood sugar  120-150   3units                       151-200   4units                       201-250   7units                       251- 300  11units                       301-350   15uints                       351-400   20units                       >400         call MD immediately  . insulin detemir (LEVEMIR) 100 UNIT/ML injection Inject 0.1 mLs (10 Units total) into the skin 2 (two) times daily.  . metFORMIN (GLUCOPHAGE) 1000 MG tablet Take 1 tablet (1,000 mg total) by mouth 2 (two) times daily with a meal.  . metoprolol tartrate (LOPRESSOR) 50 MG tablet Take 1 tablet (50 mg total) by mouth 2 (two) times daily.  Marland Kitchen  Multiple Vitamin (MULTIVITAMIN WITH MINERALS) TABS tablet Take 1 tablet by mouth daily.  Marland Kitchen senna-docusate (SENOKOT-S) 8.6-50 MG tablet Take 1 tablet by mouth at bedtime.  . simvastatin (ZOCOR) 20 MG tablet Take 20 mg by mouth daily at 6 PM.   . thiamine 100 MG tablet Take 1 tablet (100 mg total) by mouth daily.  . traZODone (DESYREL) 150 MG tablet Take 1 tablet (150 mg total) by mouth at bedtime.   No facility-administered encounter medications on file as of 03/15/2020.     Patient Active Problem List   Diagnosis Date Noted  . Empyema lung ([place])   . Epidural abscess   . Diskitis 02/06/2020  . Alcohol withdrawal delirium ([place]) 02/06/2020  . Epilepsia ([place]) 02/06/2020  . Pleural effusion on right 02/06/2020  . History of stroke 01/23/2017  . Dry eyes   . Sleep disturbance   . Essential hypertension, benign   . Upper GI bleed   . 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 Fatty liver   . Tobacco abuse   . Diabetes mellitus type 2 in nonobese (HCC)   . History of CVA with residual deficit   . Acute lower UTI   . Tachycardia   . Chronic alcoholic pancreatitis ([place])   . Hyponatremia 03/09/2016  . 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  . Acute blood loss anemia 03/09/2016  . Gastric varices   . Alcohol abuse   . Left-sided neglect   . Severe anemia   . UGIB (upper gastrointestinal bleed)   . Pressure ulcer 03/06/2016  . Acute encephalopathy   . CVA (cerebral infarction) 03/05/2016  . Carotid stenosis 04/06/2015  . CAD in native artery 03/16/2015  . Unstable angina pectoris ([place]) 03/15/2015  . Neck pain 10/20/2013  . Insomnia 12/29/2012  . 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 Occlusion and stenosis of carotid artery with cerebral infarction 12/11/2012  . Unspecified cerebral artery occlusion with cerebral infarction 12/11/2012  . Fatigue 11/26/2012  . Hallux valgus 06/25/2012  . Preventative health care 06/25/2012  . Alcohol Dependence 06/18/2012  . Smoking 06/16/2012  . Bilateral extracranial carotid artery stenosis   . Coronary Artery Disease 06/13/2012  . Ischemic Stroke 06/13/2012  . Hyperlipidemia   . Hypertension 02/25/2011  . GERD (gastroesophageal reflux disease) 02/25/2011  . Chronic Pancreatitis. 02/25/2011  . Hepatic steatosis 02/25/2011     Health Maintenance Due  Topic Date Due  . PNEUMOCOCCAL POLYSACCHARIDE VACCINE AGE 56-64 HIGH RISK  Never done  . FOOT EXAM  Never done  . OPHTHALMOLOGY EXAM  Never done  . URINE MICROALBUMIN  Never done  . COVID-19 Vaccine (1) Never done  . COLONOSCOPY  12/08/2017     Review of Systems Fatigue, weight loss, debility from illness Physical Exam   BP 113/79   Pulse 65   Temp (!) 97.4 F (36.3 C) (Oral)   Wt 159 lb (72.1 kg)   BMI 21.56 kg/m   Physical Exam  Constitutional: He is oriented to person, place, and time. He appears well-developed and well-nourished. No distress.  HENT: facial wasting Mouth/Throat: Oropharynx is clear and moist. No oropharyngeal exudate.  Cardiovascular: Normal rate, regular rhythm and normal heart sounds. Exam reveals no gallop and no friction  rub.  No murmur heard.  Pulmonary/Chest: Effort normal and breath sounds normal. No respiratory distress. He has no wheezes.  Abdominal: Soft. Bowel sounds are normal. He exhibits no distension. There is no tenderness.  Ext: right arm  picc line is c/d/i Neurological: He is alert and oriented to person, place, and time.  Skin: Skin is warm and dry. No rash noted. No erythema.  Psychiatric: He has a normal mood and affect. His behavior is normal.    CBC Lab Results  Component Value Date   WBC 7.4 02/17/2020   RBC 2.98 (L) 02/17/2020   HGB 9.2 (L) 02/17/2020   HCT 29.4 (L) 02/17/2020   PLT 494 (H) 02/17/2020   MCV 98.7 02/17/2020   MCH 30.9 02/17/2020   MCHC 31.3 02/17/2020   RDW 17.5 (H) 02/17/2020   LYMPHSABS 2.0 02/17/2020   MONOABS 0.7 02/17/2020   EOSABS 0.1 02/17/2020    BMET Lab Results  Component Value Date   NA 132 (L) 02/19/2020   K 4.7 02/19/2020   CL 98 02/19/2020   CO2 25 02/19/2020   GLUCOSE 135 (H) 02/19/2020   BUN 22 (H) 02/19/2020   CREATININE 0.55 (L) 02/19/2020   CALCIUM 8.8 (L) 02/19/2020   GFRNONAA >60 02/19/2020   GFRAA >60 02/19/2020   Streptococcus pneumoniae      MIC    CEFTRIAXONE (non-meningitis) 1 SENSITIVE  Sensitive    ERYTHROMYCIN 2 RESISTANT  Resistant    LEVOFLOXACIN <=0.25 SENS... Sensitive    PENICILLIN (non-meningitis) 1 SENSITIVE  Sensitive    PENICILLIN (oral) 1 INTERMEDI... Intermediate    PENO - penicillin 1      VANCOMYCIN 0.5 SENSITIVE  Sensitive     Assessment and Plan  Streptococcal pneumoniae ventral epidural abscess = finish out ceftriaxone through June 29th then change to oral suppression with cephalexin '500mg'$  QID for next 6-8wk. Also will repeat mri of thoracic spine  Will check labs today:cbc, bmp, sed rate and crp  Pull picc line on June 30th after completion of iv abtx

## 2020-03-16 LAB — BASIC METABOLIC PANEL
BUN/Creatinine Ratio: 26 (calc) — ABNORMAL HIGH (ref 6–22)
BUN: 16 mg/dL (ref 7–25)
CO2: 24 mmol/L (ref 20–32)
Calcium: 9.6 mg/dL (ref 8.6–10.3)
Chloride: 103 mmol/L (ref 98–110)
Creat: 0.62 mg/dL — ABNORMAL LOW (ref 0.70–1.33)
Glucose, Bld: 211 mg/dL — ABNORMAL HIGH (ref 65–99)
Potassium: 4.4 mmol/L (ref 3.5–5.3)
Sodium: 138 mmol/L (ref 135–146)

## 2020-03-16 LAB — CBC WITH DIFFERENTIAL/PLATELET
Absolute Monocytes: 422 cells/uL (ref 200–950)
Basophils Absolute: 38 cells/uL (ref 0–200)
Basophils Relative: 0.6 %
Eosinophils Absolute: 192 cells/uL (ref 15–500)
Eosinophils Relative: 3 %
HCT: 33.8 % — ABNORMAL LOW (ref 38.5–50.0)
Hemoglobin: 11 g/dL — ABNORMAL LOW (ref 13.2–17.1)
Lymphs Abs: 1453 cells/uL (ref 850–3900)
MCH: 31.5 pg (ref 27.0–33.0)
MCHC: 32.5 g/dL (ref 32.0–36.0)
MCV: 96.8 fL (ref 80.0–100.0)
MPV: 10.6 fL (ref 7.5–12.5)
Monocytes Relative: 6.6 %
Neutro Abs: 4294 cells/uL (ref 1500–7800)
Neutrophils Relative %: 67.1 %
Platelets: 244 10*3/uL (ref 140–400)
RBC: 3.49 10*6/uL — ABNORMAL LOW (ref 4.20–5.80)
RDW: 14.3 % (ref 11.0–15.0)
Total Lymphocyte: 22.7 %
WBC: 6.4 10*3/uL (ref 3.8–10.8)

## 2020-03-16 LAB — C-REACTIVE PROTEIN: CRP: 4.4 mg/L (ref <8.0)

## 2020-03-16 LAB — SEDIMENTATION RATE: Sed Rate: 36 mm/h — ABNORMAL HIGH (ref 0–20)

## 2020-03-18 DIAGNOSIS — E119 Type 2 diabetes mellitus without complications: Secondary | ICD-10-CM | POA: Diagnosis not present

## 2020-03-20 DIAGNOSIS — K219 Gastro-esophageal reflux disease without esophagitis: Secondary | ICD-10-CM | POA: Diagnosis not present

## 2020-03-20 DIAGNOSIS — E785 Hyperlipidemia, unspecified: Secondary | ICD-10-CM | POA: Diagnosis not present

## 2020-03-20 DIAGNOSIS — E7800 Pure hypercholesterolemia, unspecified: Secondary | ICD-10-CM | POA: Diagnosis not present

## 2020-03-20 DIAGNOSIS — M2550 Pain in unspecified joint: Secondary | ICD-10-CM | POA: Diagnosis not present

## 2020-03-20 DIAGNOSIS — E119 Type 2 diabetes mellitus without complications: Secondary | ICD-10-CM | POA: Diagnosis not present

## 2020-03-20 DIAGNOSIS — D649 Anemia, unspecified: Secondary | ICD-10-CM | POA: Diagnosis not present

## 2020-03-20 DIAGNOSIS — I10 Essential (primary) hypertension: Secondary | ICD-10-CM | POA: Diagnosis not present

## 2020-04-04 DIAGNOSIS — K7030 Alcoholic cirrhosis of liver without ascites: Secondary | ICD-10-CM | POA: Diagnosis not present

## 2020-04-04 DIAGNOSIS — J449 Chronic obstructive pulmonary disease, unspecified: Secondary | ICD-10-CM | POA: Diagnosis not present

## 2020-04-04 DIAGNOSIS — I10 Essential (primary) hypertension: Secondary | ICD-10-CM | POA: Diagnosis not present

## 2020-04-04 DIAGNOSIS — E119 Type 2 diabetes mellitus without complications: Secondary | ICD-10-CM | POA: Diagnosis not present

## 2020-04-04 DIAGNOSIS — E7800 Pure hypercholesterolemia, unspecified: Secondary | ICD-10-CM | POA: Diagnosis not present

## 2020-04-19 ENCOUNTER — Ambulatory Visit: Payer: Medicaid Other | Admitting: Internal Medicine

## 2020-05-09 DIAGNOSIS — K7030 Alcoholic cirrhosis of liver without ascites: Secondary | ICD-10-CM | POA: Diagnosis not present

## 2020-05-09 DIAGNOSIS — I10 Essential (primary) hypertension: Secondary | ICD-10-CM | POA: Diagnosis not present

## 2020-05-09 DIAGNOSIS — M79661 Pain in right lower leg: Secondary | ICD-10-CM | POA: Diagnosis not present

## 2020-05-09 DIAGNOSIS — G603 Idiopathic progressive neuropathy: Secondary | ICD-10-CM | POA: Diagnosis not present

## 2020-05-09 DIAGNOSIS — E7800 Pure hypercholesterolemia, unspecified: Secondary | ICD-10-CM | POA: Diagnosis not present

## 2020-05-09 DIAGNOSIS — E119 Type 2 diabetes mellitus without complications: Secondary | ICD-10-CM | POA: Diagnosis not present

## 2020-05-09 DIAGNOSIS — E639 Nutritional deficiency, unspecified: Secondary | ICD-10-CM | POA: Diagnosis not present

## 2020-05-09 DIAGNOSIS — E559 Vitamin D deficiency, unspecified: Secondary | ICD-10-CM | POA: Diagnosis not present

## 2020-05-09 DIAGNOSIS — D519 Vitamin B12 deficiency anemia, unspecified: Secondary | ICD-10-CM | POA: Diagnosis not present

## 2020-05-09 DIAGNOSIS — G608 Other hereditary and idiopathic neuropathies: Secondary | ICD-10-CM | POA: Diagnosis not present

## 2020-12-21 ENCOUNTER — Other Ambulatory Visit: Payer: Self-pay

## 2020-12-21 ENCOUNTER — Observation Stay
Admission: EM | Admit: 2020-12-21 | Discharge: 2020-12-23 | Disposition: A | Discharge status: Home / Self Care | Payer: Medicaid Other | Attending: Internal Medicine | Admitting: Internal Medicine

## 2020-12-21 DIAGNOSIS — Z7902 Long term (current) use of antithrombotics/antiplatelets: Secondary | ICD-10-CM | POA: Diagnosis not present

## 2020-12-21 DIAGNOSIS — E1142 Type 2 diabetes mellitus with diabetic polyneuropathy: Secondary | ICD-10-CM | POA: Diagnosis not present

## 2020-12-21 DIAGNOSIS — Z833 Family history of diabetes mellitus: Secondary | ICD-10-CM

## 2020-12-21 DIAGNOSIS — I2510 Atherosclerotic heart disease of native coronary artery without angina pectoris: Secondary | ICD-10-CM | POA: Diagnosis present

## 2020-12-21 DIAGNOSIS — G40909 Epilepsy, unspecified, not intractable, without status epilepticus: Secondary | ICD-10-CM | POA: Diagnosis present

## 2020-12-21 DIAGNOSIS — Z7984 Long term (current) use of oral hypoglycemic drugs: Secondary | ICD-10-CM | POA: Insufficient documentation

## 2020-12-21 DIAGNOSIS — Z760 Encounter for issue of repeat prescription: Secondary | ICD-10-CM

## 2020-12-21 DIAGNOSIS — F17210 Nicotine dependence, cigarettes, uncomplicated: Secondary | ICD-10-CM | POA: Insufficient documentation

## 2020-12-21 DIAGNOSIS — I358 Other nonrheumatic aortic valve disorders: Secondary | ICD-10-CM | POA: Diagnosis present

## 2020-12-21 DIAGNOSIS — Z7982 Long term (current) use of aspirin: Secondary | ICD-10-CM | POA: Insufficient documentation

## 2020-12-21 DIAGNOSIS — E1165 Type 2 diabetes mellitus with hyperglycemia: Principal | ICD-10-CM

## 2020-12-21 DIAGNOSIS — Z8673 Personal history of transient ischemic attack (TIA), and cerebral infarction without residual deficits: Secondary | ICD-10-CM

## 2020-12-21 DIAGNOSIS — F17200 Nicotine dependence, unspecified, uncomplicated: Secondary | ICD-10-CM | POA: Diagnosis present

## 2020-12-21 DIAGNOSIS — G43909 Migraine, unspecified, not intractable, without status migrainosus: Secondary | ICD-10-CM | POA: Diagnosis present

## 2020-12-21 DIAGNOSIS — I739 Peripheral vascular disease, unspecified: Secondary | ICD-10-CM | POA: Diagnosis not present

## 2020-12-21 DIAGNOSIS — E7800 Pure hypercholesterolemia, unspecified: Secondary | ICD-10-CM | POA: Diagnosis not present

## 2020-12-21 DIAGNOSIS — R000 Tachycardia, unspecified: Secondary | ICD-10-CM | POA: Diagnosis not present

## 2020-12-21 DIAGNOSIS — D519 Vitamin B12 deficiency anemia, unspecified: Secondary | ICD-10-CM | POA: Diagnosis not present

## 2020-12-21 DIAGNOSIS — R739 Hyperglycemia, unspecified: Secondary | ICD-10-CM

## 2020-12-21 DIAGNOSIS — F1020 Alcohol dependence, uncomplicated: Secondary | ICD-10-CM | POA: Diagnosis present

## 2020-12-21 DIAGNOSIS — E119 Type 2 diabetes mellitus without complications: Secondary | ICD-10-CM | POA: Diagnosis not present

## 2020-12-21 DIAGNOSIS — Z79899 Other long term (current) drug therapy: Secondary | ICD-10-CM | POA: Insufficient documentation

## 2020-12-21 DIAGNOSIS — I10 Essential (primary) hypertension: Secondary | ICD-10-CM | POA: Diagnosis not present

## 2020-12-21 DIAGNOSIS — E785 Hyperlipidemia, unspecified: Secondary | ICD-10-CM | POA: Diagnosis present

## 2020-12-21 DIAGNOSIS — M4640 Discitis, unspecified, site unspecified: Secondary | ICD-10-CM | POA: Diagnosis present

## 2020-12-21 DIAGNOSIS — E43 Unspecified severe protein-calorie malnutrition: Secondary | ICD-10-CM | POA: Insufficient documentation

## 2020-12-21 DIAGNOSIS — F1010 Alcohol abuse, uncomplicated: Secondary | ICD-10-CM | POA: Diagnosis present

## 2020-12-21 DIAGNOSIS — E860 Dehydration: Secondary | ICD-10-CM | POA: Diagnosis present

## 2020-12-21 DIAGNOSIS — K219 Gastro-esophageal reflux disease without esophagitis: Secondary | ICD-10-CM | POA: Diagnosis present

## 2020-12-21 DIAGNOSIS — Z823 Family history of stroke: Secondary | ICD-10-CM

## 2020-12-21 DIAGNOSIS — E871 Hypo-osmolality and hyponatremia: Secondary | ICD-10-CM | POA: Diagnosis present

## 2020-12-21 DIAGNOSIS — Z9111 Patient's noncompliance with dietary regimen: Secondary | ICD-10-CM

## 2020-12-21 DIAGNOSIS — R634 Abnormal weight loss: Secondary | ICD-10-CM | POA: Diagnosis present

## 2020-12-21 DIAGNOSIS — I639 Cerebral infarction, unspecified: Secondary | ICD-10-CM | POA: Diagnosis present

## 2020-12-21 DIAGNOSIS — Z794 Long term (current) use of insulin: Secondary | ICD-10-CM | POA: Diagnosis present

## 2020-12-21 DIAGNOSIS — I63511 Cerebral infarction due to unspecified occlusion or stenosis of right middle cerebral artery: Secondary | ICD-10-CM | POA: Diagnosis not present

## 2020-12-21 DIAGNOSIS — IMO0001 Reserved for inherently not codable concepts without codable children: Secondary | ICD-10-CM | POA: Diagnosis present

## 2020-12-21 DIAGNOSIS — K861 Other chronic pancreatitis: Secondary | ICD-10-CM | POA: Diagnosis present

## 2020-12-21 DIAGNOSIS — J439 Emphysema, unspecified: Secondary | ICD-10-CM | POA: Diagnosis present

## 2020-12-21 DIAGNOSIS — G603 Idiopathic progressive neuropathy: Secondary | ICD-10-CM | POA: Diagnosis not present

## 2020-12-21 DIAGNOSIS — Z13220 Encounter for screening for lipoid disorders: Secondary | ICD-10-CM | POA: Diagnosis not present

## 2020-12-21 DIAGNOSIS — E1100 Type 2 diabetes mellitus with hyperosmolarity without nonketotic hyperglycemic-hyperosmolar coma (NKHHC): Secondary | ICD-10-CM

## 2020-12-21 DIAGNOSIS — Z8249 Family history of ischemic heart disease and other diseases of the circulatory system: Secondary | ICD-10-CM

## 2020-12-21 DIAGNOSIS — E8889 Other specified metabolic disorders: Secondary | ICD-10-CM

## 2020-12-21 DIAGNOSIS — E1159 Type 2 diabetes mellitus with other circulatory complications: Secondary | ICD-10-CM | POA: Diagnosis not present

## 2020-12-21 DIAGNOSIS — G4700 Insomnia, unspecified: Secondary | ICD-10-CM

## 2020-12-21 DIAGNOSIS — Z20822 Contact with and (suspected) exposure to covid-19: Secondary | ICD-10-CM | POA: Diagnosis not present

## 2020-12-21 DIAGNOSIS — Z811 Family history of alcohol abuse and dependence: Secondary | ICD-10-CM

## 2020-12-21 DIAGNOSIS — E559 Vitamin D deficiency, unspecified: Secondary | ICD-10-CM | POA: Diagnosis not present

## 2020-12-21 DIAGNOSIS — R0989 Other specified symptoms and signs involving the circulatory and respiratory systems: Secondary | ICD-10-CM | POA: Diagnosis present

## 2020-12-21 HISTORY — DX: Type 2 diabetes mellitus without complications: E11.9

## 2020-12-21 LAB — URINALYSIS, COMPLETE (UACMP) WITH MICROSCOPIC
Bacteria, UA: NONE SEEN
Bilirubin Urine: NEGATIVE
Glucose, UA: >=500 mg/dL — AB
Hgb urine dipstick: NEGATIVE
Ketones, ur: 20 mg/dL — AB
Leukocytes,Ua: NEGATIVE
Nitrite: NEGATIVE
Protein, ur: NEGATIVE mg/dL
Specific Gravity, Urine: 1.035 — ABNORMAL HIGH (ref 1.005–1.030)
pH: 5 (ref 5.0–8.0)

## 2020-12-21 LAB — CBC
HCT: 41.9 % (ref 39.0–52.0)
Hemoglobin: 13.8 g/dL (ref 13.0–17.0)
MCH: 31.9 pg (ref 26.0–34.0)
MCHC: 32.9 g/dL (ref 30.0–36.0)
MCV: 96.8 fL (ref 80.0–100.0)
Platelets: 287 10*3/uL (ref 150–400)
RBC: 4.33 MIL/uL (ref 4.22–5.81)
RDW: 14.1 % (ref 11.5–15.5)
WBC: 9.9 10*3/uL (ref 4.0–10.5)
nRBC: 0 % (ref 0.0–0.2)

## 2020-12-21 LAB — HEPATIC FUNCTION PANEL
ALT: 16 U/L (ref 0–44)
AST: 27 U/L (ref 15–41)
Albumin: 3.9 g/dL (ref 3.5–5.0)
Alkaline Phosphatase: 127 U/L — ABNORMAL HIGH (ref 38–126)
Bilirubin, Direct: 0.4 mg/dL — ABNORMAL HIGH (ref 0.0–0.2)
Indirect Bilirubin: 1.1 mg/dL — ABNORMAL HIGH (ref 0.3–0.9)
Total Bilirubin: 1.5 mg/dL — ABNORMAL HIGH (ref 0.3–1.2)
Total Protein: 7.8 g/dL (ref 6.5–8.1)

## 2020-12-21 LAB — BASIC METABOLIC PANEL
Anion gap: 12 (ref 5–15)
BUN: 12 mg/dL (ref 6–20)
CO2: 22 mmol/L (ref 22–32)
Calcium: 9.3 mg/dL (ref 8.9–10.3)
Chloride: 92 mmol/L — ABNORMAL LOW (ref 98–111)
Creatinine, Ser: 0.76 mg/dL (ref 0.61–1.24)
GFR, Estimated: >60 mL/min (ref >60)
Glucose, Bld: 520 mg/dL (ref 70–99)
Potassium: 4.9 mmol/L (ref 3.5–5.1)
Sodium: 126 mmol/L — ABNORMAL LOW (ref 135–145)

## 2020-12-21 LAB — ETHANOL: Alcohol, Ethyl (B): <10 mg/dL (ref <10)

## 2020-12-21 LAB — CBG MONITORING, ED
Glucose-Capillary: 489 mg/dL — ABNORMAL HIGH (ref 70–99)
Glucose-Capillary: 519 mg/dL (ref 70–99)
Glucose-Capillary: 318 mg/dL — ABNORMAL HIGH (ref 70–99)

## 2020-12-21 LAB — BETA-HYDROXYBUTYRIC ACID: Beta-Hydroxybutyric Acid: 2.54 mmol/L — ABNORMAL HIGH (ref 0.05–0.27)

## 2020-12-21 LAB — RESP PANEL BY RT-PCR (FLU A&B, COVID) ARPGX2
Influenza A by PCR: NEGATIVE
Influenza B by PCR: NEGATIVE
SARS Coronavirus 2 by RT PCR: NEGATIVE

## 2020-12-21 LAB — MAGNESIUM: Magnesium: 1.6 mg/dL — ABNORMAL LOW (ref 1.7–2.4)

## 2020-12-21 MED ORDER — INSULIN ASPART 100 UNIT/ML ~~LOC~~ SOLN
0.0000 [IU] | SUBCUTANEOUS | Status: DC
  Administered 2020-12-21: 15 [IU] via SUBCUTANEOUS
  Filled 2020-12-21: qty 1

## 2020-12-21 MED ORDER — ASPIRIN EC 81 MG PO TBEC
81.0000 mg | DELAYED_RELEASE_TABLET | Freq: Every day | ORAL | Status: DC
  Administered 2020-12-21 – 2020-12-23 (×3): 81 mg via ORAL
  Filled 2020-12-21 (×3): qty 1

## 2020-12-21 MED ORDER — SODIUM CHLORIDE 0.9 % IV SOLN: INTRAVENOUS | Status: AC

## 2020-12-21 MED ORDER — METFORMIN HCL 500 MG PO TABS
1000.0000 mg | ORAL_TABLET | Freq: Two times a day (BID) | ORAL | Status: DC
  Filled 2020-12-21: qty 2

## 2020-12-21 MED ORDER — TRAZODONE HCL 50 MG PO TABS
150.0000 mg | ORAL_TABLET | Freq: Every day | ORAL | Status: DC
  Administered 2020-12-21 – 2020-12-22 (×2): 150 mg via ORAL
  Filled 2020-12-21 (×2): qty 1

## 2020-12-21 MED ORDER — FOLIC ACID 1 MG PO TABS
1.0000 mg | ORAL_TABLET | Freq: Every day | ORAL | Status: DC
  Administered 2020-12-22 – 2020-12-23 (×2): 1 mg via ORAL
  Filled 2020-12-21 (×3): qty 1

## 2020-12-21 MED ORDER — MAGNESIUM SULFATE 2 GM/50ML IV SOLN
2.0000 g | Freq: Once | INTRAVENOUS | Status: AC
  Administered 2020-12-21: 2 g via INTRAVENOUS
  Filled 2020-12-21: qty 50

## 2020-12-21 MED ORDER — METOPROLOL TARTRATE 50 MG PO TABS
50.0000 mg | ORAL_TABLET | Freq: Two times a day (BID) | ORAL | Status: DC
  Administered 2020-12-21 – 2020-12-23 (×4): 50 mg via ORAL
  Filled 2020-12-21 (×4): qty 1

## 2020-12-21 MED ORDER — FERROUS SULFATE 325 (65 FE) MG PO TABS
325.0000 mg | ORAL_TABLET | Freq: Every day | ORAL | Status: DC
  Administered 2020-12-21 – 2020-12-23 (×3): 325 mg via ORAL
  Filled 2020-12-21 (×3): qty 1

## 2020-12-21 MED ORDER — FAMOTIDINE 20 MG PO TABS
20.0000 mg | ORAL_TABLET | Freq: Two times a day (BID) | ORAL | Status: DC
  Administered 2020-12-21 – 2020-12-23 (×4): 20 mg via ORAL
  Filled 2020-12-21 (×4): qty 1

## 2020-12-21 MED ORDER — ADULT MULTIVITAMIN W/MINERALS CH
1.0000 | ORAL_TABLET | Freq: Every day | ORAL | Status: DC
  Administered 2020-12-22 – 2020-12-23 (×2): 1 via ORAL
  Filled 2020-12-21 (×3): qty 1

## 2020-12-21 MED ORDER — NICOTINE 14 MG/24HR TD PT24
14.0000 mg | MEDICATED_PATCH | Freq: Every day | TRANSDERMAL | Status: DC
  Administered 2020-12-21 – 2020-12-23 (×3): 14 mg via TRANSDERMAL
  Filled 2020-12-21 (×3): qty 1

## 2020-12-21 MED ORDER — LACTATED RINGERS IV BOLUS
1000.0000 mL | Freq: Once | INTRAVENOUS | Status: AC
  Administered 2020-12-21: 1000 mL via INTRAVENOUS

## 2020-12-21 MED ORDER — INSULIN ASPART 100 UNIT/ML ~~LOC~~ SOLN
0.0000 [IU] | Freq: Three times a day (TID) | SUBCUTANEOUS | Status: DC
  Administered 2020-12-22: 5 [IU] via SUBCUTANEOUS
  Administered 2020-12-22: 9 [IU] via SUBCUTANEOUS
  Administered 2020-12-22: 7 [IU] via SUBCUTANEOUS
  Administered 2020-12-23: 9 [IU] via SUBCUTANEOUS
  Administered 2020-12-23: 2 [IU] via SUBCUTANEOUS
  Filled 2020-12-21 (×5): qty 1

## 2020-12-21 MED ORDER — THIAMINE HCL 100 MG PO TABS
100.0000 mg | ORAL_TABLET | Freq: Every day | ORAL | Status: DC
  Administered 2020-12-22 – 2020-12-23 (×2): 100 mg via ORAL
  Filled 2020-12-21 (×3): qty 1

## 2020-12-21 MED ORDER — PANTOPRAZOLE SODIUM 40 MG IV SOLR
40.0000 mg | INTRAVENOUS | Status: DC
  Administered 2020-12-21 – 2020-12-22 (×2): 40 mg via INTRAVENOUS
  Filled 2020-12-21 (×2): qty 40

## 2020-12-21 MED ORDER — GLUCERNA SHAKE PO LIQD
237.0000 mL | ORAL | Status: AC
  Administered 2020-12-21: 237 mL via ORAL

## 2020-12-21 MED ORDER — BUSPIRONE HCL 10 MG PO TABS
10.0000 mg | ORAL_TABLET | Freq: Two times a day (BID) | ORAL | Status: DC
Start: 2020-12-21 — End: 2020-12-23
  Administered 2020-12-21 – 2020-12-23 (×4): 10 mg via ORAL
  Filled 2020-12-21: qty 1
  Filled 2020-12-21: qty 2
  Filled 2020-12-21: qty 1
  Filled 2020-12-21: qty 2
  Filled 2020-12-21: qty 1

## 2020-12-21 MED ORDER — CLOPIDOGREL BISULFATE 75 MG PO TABS
75.0000 mg | ORAL_TABLET | Freq: Every day | ORAL | Status: DC
  Administered 2020-12-22 – 2020-12-23 (×2): 75 mg via ORAL
  Filled 2020-12-21 (×3): qty 1

## 2020-12-21 MED ORDER — LISINOPRIL 10 MG PO TABS
10.0000 mg | ORAL_TABLET | Freq: Every day | ORAL | Status: DC
  Administered 2020-12-21 – 2020-12-23 (×3): 10 mg via ORAL
  Filled 2020-12-21 (×3): qty 1

## 2020-12-21 MED ORDER — SIMVASTATIN 20 MG PO TABS
20.0000 mg | ORAL_TABLET | Freq: Every day | ORAL | Status: DC
  Administered 2020-12-22: 20 mg via ORAL
  Filled 2020-12-21 (×2): qty 1

## 2020-12-21 NOTE — H&P (Signed)
History and Physical    Brandon [name]:[id number] DOB: 14-Jan-1966 DOA: 12/21/2020  PCP: Remi Haggard, FNP    Patient coming from:  Home   Chief Complaint:  Hyperglycemia   HPI: Brandon [name] is a 55 y.o. male seen in ed with complaints of Hyperglycemia. Pt's PCP sent patient to the emergency room for hyperglycemia and poor diabetes control. Patient has poor insight into his medical problems and has not been taking his Metformin.  Patient reports that he has been out of medicines for about a week for 2 weeks.  Patient also denies any alcohol.  He does report smoking.    Pt has past medical history of heart disease, diabetes, CVA, depression, hypertension, hyperlipidemia, pancreatitis, history of alcohol abuse, emphysema, epilepsy.     ED Course:  Vitals:   12/21/20 1734 12/21/20 1739  BP: (!) 159/95   Pulse: 99   Resp: 20   Temp: 97.7 F (36.5 C)   TempSrc: Oral   SpO2: 98%   Weight:  59.4 kg  Height:  6' (1.829 m)  In the emergency room patient is alert awake afebrile oxygenating 98% on room air blood pressure is high at 159/95.  Venous blood gas is within normal limits.  Comprehensive metabolic panel shows hyponatremia of 126 and hyperglycemia 520, serum bicarb of 22, anion gap of 12, magnesium of 1.6, alk phos of 120 7T bili of 1.5, serum creatinine 0.76 of more than 60.  CBC shows normal white count of 9.9 hemoglobin of 13.8 and platelet count of 287 otherwise normal.  Beta hydroxybutyrate of 2.54, serum glucose of 520.  Urinalysis today shows glucosuria more than 500.   Review of Systems:  Review of Systems  Constitutional: Negative.   HENT: Negative.   Eyes: Negative.   Respiratory: Negative.   Cardiovascular: Negative.   Gastrointestinal: Positive for abdominal pain.  Musculoskeletal: Negative.   Neurological: Negative.      Past Medical History:  Diagnosis Date  . Alcohol dependency ([place])    Hx ETOH withdrawal seizures before 2011  . CAD  (coronary artery disease) 06/13/2012   Calcification noted on CTA of chest in 2012 Wall motion abnormality on ECHO    . Carotid artery disease ([place]) 2016   bilateral.  s/p left carotid stent 03/2015  . 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   Marland Kitchen Depression with anxiety   . Diabetes mellitus without complication ([place])   . GERD (gastroesophageal reflux disease)   . Headache(784.0)    migraine  . Heart disease 02/2015   PCI/DES placed to RCA: on chronic Plavix/ASA  . Hyperlipidemia   . Hypertension   . Pancreatitis 2011....    CT findings in May 2011 with inflammation and pseudocyst.  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/2016  . Renal artery stenosis, native, bilateral ([place]) 02/2015    Past Surgical History:  Procedure Laterality Date  . CARDIAC CATHETERIZATION N/A 03/15/2015   Procedure: Left Heart Cath;  Surgeon: Dionisio David, MD;  Location: [place] CV LAB;  Service: Cardiovascular;  Laterality: N/A;  . CARDIAC CATHETERIZATION N/A 03/16/2015   Procedure: Coronary Stent Intervention;  Surgeon: Yolonda Kida, MD;  Location: [place] CV LAB;  Service: Cardiovascular;  Laterality: N/A;  . CAROTID ANGIOGRAM N/A 06/15/2012   Procedure: CAROTID ANGIOGRAM;  Surgeon: Angelia Mould, MD;  Location: [facility] CATH LAB;  Service: Cardiovascular;  Laterality: N/A;  . ESOPHAGOGASTRODUODENOSCOPY N/A 03/08/2016  Procedure: ESOPHAGOGASTRODUODENOSCOPY (EGD);  Surgeon: Ruffin Frederick, MD;  Location: [facility] ENDOSCOPY;  Service: Gastroenterology;  Laterality: N/A;  . none    . PERIPHERAL VASCULAR CATHETERIZATION Left 04/06/2015   Procedure: Carotid PTA/Stent Intervention;  Surgeon: Annice Needy, MD;  Location: [facility] INVASIVE CV LAB;  Service: Cardiovascular;  Laterality: Left;     reports that he has been smoking cigarettes. He has a 30.00 pack-year smoking history. He has never used smokeless tobacco. He reports that he does not drink  alcohol and does not use drugs.  Allergies  Allergen Reactions  . No Known Allergies     Family History  Problem Relation Age of Onset  . Stroke Mother        deceased  . Coronary artery disease Mother   . Alcohol abuse Mother   . Cancer Mother   . Hypertension Father        alive  . Alcohol abuse Father   . Diabetes Father   . Kidney disease Father   . Stroke Maternal Grandmother     Prior to Admission medications   Medication Sig Start Date End Date Taking? Authorizing Provider  albuterol (PROVENTIL) (2.5 MG/3ML) 0.083% nebulizer solution Take 3 mLs (2.5 mg total) by nebulization every 6 (six) hours as needed for wheezing or shortness of breath. 02/19/20  Yes [name], MD  ASPIRIN LOW DOSE 81 MG EC tablet Take 81 mg by mouth daily. 10/19/20  Yes [provider]  busPIRone (BUSPAR) 10 MG tablet Take 10 mg by mouth 2 (two) times daily. 12/28/19  Yes [provider]  clopidogrel (PLAVIX) 75 MG tablet Take 75 mg by mouth daily.    Yes [provider]  cyclobenzaprine (FLEXERIL) 10 MG tablet Take 1 tablet by mouth every 12 (twelve) hours. 10/19/20  Yes [provider]  famotidine (PEPCID) 20 MG tablet Take 20 mg by mouth 2 (two) times daily.  01/08/17  Yes [provider]  FEROSUL 325 (65 Fe) MG tablet Take 325 mg by mouth daily. 10/19/20  Yes [provider]  folic acid (FOLVITE) 1 MG tablet Take 1 tablet (1 mg total) by mouth daily. 02/20/20  Yes [name], MD  lisinopril (ZESTRIL) 10 MG tablet Take 10 mg by mouth daily. 10/19/20  Yes [provider]  metFORMIN (GLUCOPHAGE) 1000 MG tablet Take 1 tablet (1,000 mg total) by mouth 2 (two) times daily with a meal. 03/29/16  Yes Angiulli, Mcarthur Rossetti, PA-C  metoprolol tartrate (LOPRESSOR) 50 MG tablet Take 1 tablet (50 mg total) by mouth 2 (two) times daily. 02/19/20  Yes [name], MD  Multiple Vitamin (MULTIVITAMIN WITH MINERALS) TABS tablet Take 1 tablet by mouth daily. 02/20/20  Yes [name], MD  ranitidine (ZANTAC) 150 MG capsule Take 150 mg by mouth every evening.   Yes [provider]  senna-docusate (SENOKOT-S) 8.6-50 MG tablet Take 1 tablet by mouth at bedtime. 02/19/20  Yes [name], MD  simvastatin (ZOCOR) 20 MG tablet Take 20 mg by mouth daily at 6 PM.    Yes [provider]  traZODone (DESYREL) 150 MG tablet Take 1 tablet (150 mg total) by mouth at bedtime. 02/19/20  Yes [name], MD  Amino Acids-Protein Hydrolys (FEEDING SUPPLEMENT, PRO-STAT SUGAR FREE 64,) LIQD Take 30 mLs by mouth 2 (two) times daily. 02/19/20   [name], MD  feeding supplement, GLUCERNA SHAKE, (GLUCERNA SHAKE) LIQD Take 237 mLs by mouth 3 (three) times daily between meals. 02/19/20   [name],  MD  insulin aspart (NOVOLOG) 100 UNIT/ML injection Inject 3 Units into the skin 3 (three) times daily with meals. Patient not taking: Reported on 12/21/2020 02/19/20   [name], MD  insulin aspart (NOVOLOG) 100 UNIT/ML injection Inject 0-15 Units into the skin 3 (three) times daily with meals. Insulin sliding scale: Blood sugar  120-150   3units                       151-200   4units                       201-250   7units                       251- 300  11units                       301-350   15uints                       351-400   20units                       >400         call MD immediately Patient not taking: Reported on 12/21/2020 02/19/20   [name], MD  insulin detemir (LEVEMIR) 100 UNIT/ML injection Inject 0.1 mLs (10 Units total) into the skin 2 (two) times daily. Patient not taking: Reported on 12/21/2020 02/19/20   [name], MD    Physical Exam: Vitals:   12/21/20 1734 12/21/20 1739  BP: (!) 159/95   Pulse: 99   Resp: 20   Temp: 97.7 F (36.5 C)   TempSrc: Oral   SpO2: 98%   Weight:  59.4 kg  Height:  6' (1.829 m)   Physical Exam Vitals and nursing note reviewed.  Constitutional:      Appearance: Normal appearance. He is not ill-appearing.  HENT:     Head: Normocephalic  and atraumatic.     Right Ear: External ear normal.     Left Ear: External ear normal.     Mouth/Throat:     Mouth: Mucous membranes are moist.  Eyes:     Extraocular Movements: Extraocular movements intact.     Pupils: Pupils are equal, round, and reactive to light.  Neck:     Vascular: Carotid bruit present.   Cardiovascular:     Rate and Rhythm: Normal rate and regular rhythm.     Pulses: Normal pulses.     Heart sounds: Murmur heard.    Pulmonary:     Effort: Pulmonary effort is normal. No respiratory distress.     Breath sounds: Normal breath sounds.  Abdominal:     General: Bowel sounds are normal. There is no distension.     Palpations: Abdomen is soft.     Tenderness: There is abdominal tenderness. There is no guarding.              Labs on Admission: I have personally reviewed following labs and imaging studies  No results for input(s): CKTOTAL, CKMB, TROPONINI in the last 72 hours. Lab Results  Component Value Date   WBC 9.9 12/21/2020   HGB 13.8 12/21/2020   HCT 41.9 12/21/2020   MCV 96.8 12/21/2020   PLT 287 12/21/2020    Recent Labs  Lab 12/21/20 1743  NA 126*  K 4.9  CL 92*  CO2  22  BUN 12  CREATININE 0.76  CALCIUM 9.3  PROT 7.8  BILITOT 1.5*  ALKPHOS 127*  ALT 16  AST 27  GLUCOSE 520*   Lab Results  Component Value Date   CHOL 74 03/06/2016   HDL 27 (L) 03/06/2016   LDLCALC 37 03/06/2016   TRIG 51 03/06/2016   No results found for: DDIMER Invalid input(s): POCBNP  Urinalysis    Component Value Date/Time   COLORURINE YELLOW (A) 12/21/2020 1744   APPEARANCEUR CLEAR (A) 12/21/2020 1744   LABSPEC 1.035 (H) 12/21/2020 1744   PHURINE 5.0 12/21/2020 1744   GLUCOSEU >=500 (A) 12/21/2020 1744   HGBUR NEGATIVE 12/21/2020 1744   BILIRUBINUR NEGATIVE 12/21/2020 1744   KETONESUR 20 (A) 12/21/2020 1744   PROTEINUR NEGATIVE 12/21/2020 1744   UROBILINOGEN 0.2 02/16/2014 1742   NITRITE NEGATIVE 12/21/2020 1744   LEUKOCYTESUR  NEGATIVE 12/21/2020 1744    COVID-19 Labs Pending.  Lab Results  Component Value Date   SARSCOV2NAA NEGATIVE 02/18/2020   SARSCOV2NAA NEGATIVE 02/06/2020    Radiological Exams on Admission: No results found.  EKG: Independently reviewed.  None.  Echocardiogram 02/07/2020: IMPRESSIONS  1. Left ventricular ejection fraction, by estimation, is 55 to 60%. The  left ventricle has normal function. The left ventricle has no regional  wall motion abnormalities. Left ventricular diastolic parameters were  normal.  2. Right ventricular systolic function is normal. The right ventricular  size is normal.  3. Left atrial size was mildly dilated.  4. The mitral valve is normal in structure. Trivial mitral valve  regurgitation. No evidence of mitral stenosis.  5. The aortic valve is grossly normal. Aortic valve regurgitation is not  visualized. Mild aortic valve sclerosis is present, with no evidence of  aortic valve stenosis.    Assessment/Plan Principal Problem:   Hyperglycemia due to type 2 diabetes mellitus (HCC) Active Problems:   Diabetes mellitus type 2 in nonobese (HCC)   Hypertension   GERD (gastroesophageal reflux disease)   Coronary Artery Disease   Ischemic Stroke   Hyperlipidemia   Smoking   Alcohol Dependence   Hyponatremia   Diskitis   Hyperglycemia due to diabetes mellitus type 2: Admit to med surge with telemetry monitoring. Sliding scale insulin, glycemic protocol and hypoglycemic protocol. Check A1c, Accu-Cheks.  Metformin held, continue coverage primarily with sliding scale insulin.  Diabetes mellitus type 2:  Diabetes teaching and assistance per patient need. SSI/ Accu-Cheks.  Metformin held, continue coverage primarily with sliding scale insulin.   Hypertension: We will continue patient on his lisinopril 10 mg, metoprolol 50 mg twice a day, as needed hydralazine.  GERD: IV PPI therapy.  Coronary artery disease: We will continue patient on  metoprolol 50 mg twice a day, Zocor 20 mg at bedtime, aspirin 81, Plavix 75.  History of ischemic stroke. Patient continued on aspirin and Plavix.  Hyperlipidemia: Patient continued on his atorvastatin.   Tobacco abuse: Nicotine patch. Counseled pt on tobacco cessation.   BL Carotid bruit: Pt states the circulation to back of his brain isn completely blocked.  We will cont asa/plavix/ statin.  Abdominal pain: Left flank abd pain on exam. We will obtain lipase and iv ppi / stool guaiac and evaluate. Cbc is normal do not suspect infectious etiology but more chronic issues and will start with guaiac.   Alcohol dependence: Last drink 2 months ago. We will check an ethanol level. Thiamine 100 mg daily.     DVT prophylaxis:  SCDs  Code Status:  Full code  Family Communication:  Arville Go (Sister)  [phone] [facility] Phone)  Disposition Plan:  Home  Consults called:  None  Admission status: Observation.   Para Skeans MD [facility] [phone] How to contact the [facility] Attending or Consulting provider [place] or covering provider during after hours [place], for this patient.    1. Check the care team in [facility] and look for a) attending/consulting [place] provider listed and b) the [facility] team listed 2. Log into [email or web address] and use [place]'s universal password to access. If you do not have the password, please contact the hospital operator. 3. Locate the [facility] provider you are looking for under [facility] and page to a number that you can be directly reached. 4. If you still have difficulty reaching the provider, please page the [facility] (Director on Call) for the Hospitalists listed on amion for assistance. [email or web address] Password [email or web address] 12/21/2020, 8:10 PM

## 2020-12-21 NOTE — ED Triage Notes (Signed)
Pt sent over by PCP for failure to thrive.  Pt has h/o DM and HTN.  Pt has dropped over 30lbs since December.  Pt has had decreased oral intake with uncontrolled sugars.  Pt is A&Ox4 and has even and unlabored respirations at this time.

## 2020-12-21 NOTE — ED Notes (Signed)
CBG 318 

## 2020-12-21 NOTE — ED Notes (Signed)
Pt pale and thin, pt states he has lost over 30 pounds in the last month. Pt states he takes blood thinners. Pt states he has been out of metformin for over a month.

## 2020-12-21 NOTE — ED Provider Notes (Signed)
[facility] Emergency Department Provider Note  ____________________________________________   Event Date/Time   First MD Initiated Contact with Patient 12/21/20 1753     (approximate)  I have reviewed the triage vital signs and the nursing notes.   HISTORY  Chief Complaint Weight Loss and Hyperglycemia   HPI Brandon [name] is a 55 y.o. male the past medical history of CAD, CVA, depression, DM, HTN, HDL, pancreatitis, remote alcohol abuse, emphysema and epilepsy who presents after being referred to the ED for assessment of concern for possible "failure to thrive" as patient apparently has lost around 30 pounds since December and his sugars have been out of control has not taken his Metformin in a month.  Patient denies any acute complaints other than some weight loss.  He denies any headache, earache, sore throat, chest pain, cough, shortness of breath, Donnell pain, nausea, vomiting, diarrhea, dysuria, rash, or any other acute sick symptoms.  No recent falls or injuries.  States he has been staying out of office medicines for at least over a week and a half.  When asked why he did not get these refilled he states "it was a lack of communication.  He denies any SI or HI.  He states he has not had any alcohol and many months.  He denies any illicit drug use but does endorse some tobacco abuse.  States he was told to come over to the ED by his PCP who was worried about his weight loss.         Past Medical History:  Diagnosis Date  . Alcohol dependency (HCC)    Hx ETOH withdrawal seizures before 2011  . CAD (coronary artery disease) 06/13/2012   Calcification noted on CTA of chest in 2012 Wall motion abnormality on ECHO    . Carotid artery disease (HCC) 2016   bilateral.  s/p left carotid stent 03/2015  . 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   Marland Kitchen Depression with anxiety   . Diabetes mellitus  without complication (HCC)   . GERD (gastroesophageal reflux disease)   . Headache(784.0)    migraine  . Heart disease 02/2015   PCI/DES placed to RCA: on chronic Plavix/ASA  . Hyperlipidemia   . Hypertension   . Pancreatitis 2011....    CT findings in May 2011 with inflammation and pseudocyst.  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/2016  . Renal artery stenosis, native, bilateral (HCC) 02/2015    Patient Active Problem List   Diagnosis Date Noted  . Hyperglycemia due to type 2 diabetes mellitus (HCC) 12/21/2020  . Empyema lung (HCC)   . Epidural abscess   . Diskitis 02/06/2020  . Alcohol withdrawal delirium (HCC) 02/06/2020  . Epilepsia (HCC) 02/06/2020  . Pleural effusion on right 02/06/2020  . History of stroke 01/23/2017  . Dry eyes   . Sleep disturbance   . Essential hypertension, benign   . Upper GI bleed   . 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  . Fatty liver   . Tobacco abuse   . Diabetes mellitus type 2 in nonobese (HCC)   . History of CVA with residual deficit   . Acute lower UTI   . Tachycardia   . Chronic alcoholic pancreatitis (HCC)   . Hyponatremia 03/09/2016  . 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  . Acute blood loss anemia 03/09/2016  . Gastric varices   . Alcohol abuse   .  Left-sided neglect   . Severe anemia   . UGIB (upper gastrointestinal bleed)   . Pressure ulcer 03/06/2016  . Acute encephalopathy   . CVA (cerebral infarction) 03/05/2016  . Carotid stenosis 04/06/2015  . CAD in native artery 03/16/2015  . Unstable angina pectoris (HCC) 03/15/2015  . Neck pain 10/20/2013  . Insomnia 12/29/2012  . 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 Occlusion and stenosis of carotid artery with cerebral infarction 12/11/2012  . Unspecified cerebral artery occlusion with cerebral infarction 12/11/2012  . Fatigue 11/26/2012  . Hallux valgus 06/25/2012  . Preventative health care 06/25/2012  . Alcohol  Dependence 06/18/2012  . Smoking 06/16/2012  . Bilateral extracranial carotid artery stenosis   . Coronary Artery Disease 06/13/2012  . Ischemic Stroke 06/13/2012  . Hyperlipidemia   . Hypertension 02/25/2011  . GERD (gastroesophageal reflux disease) 02/25/2011  . Chronic Pancreatitis. 02/25/2011  . Hepatic steatosis 02/25/2011    Past Surgical History:  Procedure Laterality Date  . CARDIAC CATHETERIZATION N/A 03/15/2015   Procedure: Left Heart Cath;  Surgeon: Laurier NancyShaukat A Khan, MD;  [facility];  Service: Cardiovascular;  Laterality: N/A;  . CARDIAC CATHETERIZATION N/A 03/16/2015   Procedure: Coronary Stent Intervention;  Surgeon: Alwyn Peawayne D Callwood, MD;  Location: [facility] INVASIVE CV LAB;  Service: Cardiovascular;  Laterality: N/A;  . CAROTID ANGIOGRAM N/A 06/15/2012   Procedure: CAROTID ANGIOGRAM;  Surgeon: Chuck Hinthristopher S Dickson, MD;  [facility];  Service: Cardiovascular;  Laterality: N/A;  . ESOPHAGOGASTRODUODENOSCOPY N/A 03/08/2016   Procedure: ESOPHAGOGASTRODUODENOSCOPY (EGD);  Surgeon: Ruffin FrederickSteven Paul Armbruster, MD;  [facility];  Service: Gastroenterology;  Laterality: N/A;  . none    . PERIPHERAL VASCULAR CATHETERIZATION Left 04/06/2015   Procedure: Carotid PTA/Stent Intervention;  Surgeon: Annice NeedyJason S Dew, MD;  Location: [facility] INVASIVE CV LAB;  Service: Cardiovascular;  Laterality: Left;    Prior to Admission medications   Medication Sig Start Date End Date Taking? Authorizing Provider  albuterol (PROVENTIL) (2.5 MG/3ML) 0.083% nebulizer solution Take 3 mLs (2.5 mg total) by nebulization every 6 (six) hours as needed for wheezing or shortness of breath. 02/19/20  Yes Albertine GratesXu, Fang, MD  ASPIRIN LOW DOSE 81 MG EC tablet Take 81 mg by mouth daily. 10/19/20  Yes [provider]  busPIRone (BUSPAR) 10 MG tablet Take 10 mg by mouth 2 (two) times daily. 12/28/19  Yes [provider]  clopidogrel (PLAVIX) 75 MG tablet Take 75 mg by mouth daily.    Yes  [provider]  cyclobenzaprine (FLEXERIL) 10 MG tablet Take 1 tablet by mouth every 12 (twelve) hours. 10/19/20  Yes [provider]  famotidine (PEPCID) 20 MG tablet Take 20 mg by mouth 2 (two) times daily.  01/08/17  Yes [provider]  FEROSUL 325 (65 Fe) MG tablet Take 325 mg by mouth daily. 10/19/20  Yes [provider]  folic acid (FOLVITE) 1 MG tablet Take 1 tablet (1 mg total) by mouth daily. 02/20/20  Yes Albertine GratesXu, Fang, MD  lisinopril (ZESTRIL) 10 MG tablet Take 10 mg by mouth daily. 10/19/20  Yes [provider]  metFORMIN (GLUCOPHAGE) 1000 MG tablet Take 1 tablet (1,000 mg total) by mouth 2 (two) times daily with a meal. 03/29/16  Yes Angiulli, Mcarthur Rossettianiel J, PA-C  metoprolol tartrate (LOPRESSOR) 50 MG tablet Take 1 tablet (50 mg total) by mouth 2 (two) times daily. 02/19/20  Yes Albertine GratesXu, Fang, MD  Multiple Vitamin (MULTIVITAMIN WITH MINERALS) TABS tablet Take 1 tablet by mouth daily.  02/20/20  Yes [name], MD  ranitidine (ZANTAC) 150 MG capsule Take 150 mg by mouth every evening.   Yes [provider]  senna-docusate (SENOKOT-S) 8.6-50 MG tablet Take 1 tablet by mouth at bedtime. 02/19/20  Yes [name], MD  simvastatin (ZOCOR) 20 MG tablet Take 20 mg by mouth daily at 6 PM.    Yes [provider]  traZODone (DESYREL) 150 MG tablet Take 1 tablet (150 mg total) by mouth at bedtime. 02/19/20  Yes [name], MD  Amino Acids-Protein Hydrolys (FEEDING SUPPLEMENT, PRO-STAT SUGAR FREE 64,) LIQD Take 30 mLs by mouth 2 (two) times daily. 02/19/20   [name], MD  feeding supplement, GLUCERNA SHAKE, (GLUCERNA SHAKE) LIQD Take 237 mLs by mouth 3 (three) times daily between meals. 02/19/20   [name], MD  insulin aspart (NOVOLOG) 100 UNIT/ML injection Inject 3 Units into the skin 3 (three) times daily with meals. Patient not taking: Reported on 12/21/2020 02/19/20   [name], MD  insulin aspart (NOVOLOG) 100 UNIT/ML injection Inject 0-15 Units into the skin 3  (three) times daily with meals. Insulin sliding scale: Blood sugar  120-150   3units                       151-200   4units                       201-250   7units                       251- 300  11units                       301-350   15uints                       351-400   20units                       >400         call MD immediately Patient not taking: Reported on 12/21/2020 02/19/20   [name], MD  insulin detemir (LEVEMIR) 100 UNIT/ML injection Inject 0.1 mLs (10 Units total) into the skin 2 (two) times daily. Patient not taking: Reported on 12/21/2020 02/19/20   [name], MD    Allergies No known allergies  Family History  Problem Relation Age of Onset  . Stroke Mother        deceased  . Coronary artery disease Mother   . Alcohol abuse Mother   . Cancer Mother   . Hypertension Father        alive  . Alcohol abuse Father   . Diabetes Father   . Kidney disease Father   . Stroke Maternal Grandmother     Social History Social History   Tobacco Use  . Smoking status: Current Every Day Smoker    Packs/day: 1.00    Years: 30.00    Pack years: 30.00    Types: Cigarettes  . Smokeless tobacco: Never Used  . Tobacco comment: smoking less  Substance Use Topics  . Alcohol use: No    Alcohol/week: 6.0 standard drinks    Types: 6 Cans of beer per week    Comment: drinks 6-12 beer daily  . Drug use: No    Review of Systems  Review of Systems  Constitutional: Positive for malaise/fatigue and weight loss. Negative for  chills and fever.  HENT: Negative for sore throat.   Eyes: Negative for pain.  Respiratory: Negative for cough and stridor.   Cardiovascular: Negative for chest pain.  Gastrointestinal: Negative for vomiting.  Genitourinary: Negative for dysuria.  Musculoskeletal: Negative for myalgias.  Skin: Negative for rash.  Neurological: Negative for seizures, loss of consciousness and headaches.  Endo/Heme/Allergies: Positive for polydipsia.   Psychiatric/Behavioral: Negative for suicidal ideas.  All other systems reviewed and are negative.     ____________________________________________   PHYSICAL EXAM:  VITAL SIGNS: ED Triage Vitals  Enc Vitals Group     BP 12/21/20 1734 (!) 159/95     Pulse Rate 12/21/20 1734 99     Resp 12/21/20 1734 20     Temp 12/21/20 1734 97.7 F (36.5 C)     Temp Source 12/21/20 1734 Oral     SpO2 12/21/20 1734 98 %     Weight 12/21/20 1739 131 lb (59.4 kg)     Height 12/21/20 1739 6' (1.829 m)     Head Circumference --      Peak Flow --      Pain Score 12/21/20 1739 0     Pain Loc --      Pain Edu? --      Excl. in GC? --    Vitals:   12/21/20 1734  BP: (!) 159/95  Pulse: 99  Resp: 20  Temp: 97.7 F (36.5 C)  SpO2: 98%   Physical Exam Vitals and nursing note reviewed.  Constitutional:      General: He is not in acute distress.    Appearance: He is cachectic.  HENT:     Head: Normocephalic and atraumatic.     Right Ear: External ear normal.     Left Ear: External ear normal.     Nose: Nose normal.     Mouth/Throat:     Mouth: Mucous membranes are dry.  Eyes:     Conjunctiva/sclera: Conjunctivae normal.  Cardiovascular:     Rate and Rhythm: Normal rate and regular rhythm.     Heart sounds: No murmur heard.   Pulmonary:     Effort: Pulmonary effort is normal. No respiratory distress.     Breath sounds: Normal breath sounds.  Abdominal:     Palpations: Abdomen is soft.     Tenderness: There is no abdominal tenderness.  Musculoskeletal:     Cervical back: Neck supple.  Skin:    General: Skin is warm and dry.     Capillary Refill: Capillary refill takes more than 3 seconds.  Neurological:     Mental Status: He is alert and oriented to person, place, and time.  Psychiatric:        Mood and Affect: Mood normal.      ____________________________________________   LABS (all labs ordered are listed, but only abnormal results are displayed)  Labs Reviewed   BASIC METABOLIC PANEL - Abnormal; Notable for the following components:      Result Value   Sodium 126 (*)    Chloride 92 (*)    Glucose, Bld 520 (*)    All other components within normal limits  URINALYSIS, COMPLETE (UACMP) WITH MICROSCOPIC - Abnormal; Notable for the following components:   Color, Urine YELLOW (*)    APPearance CLEAR (*)    Specific Gravity, Urine 1.035 (*)    Glucose, UA >=500 (*)    Ketones, ur 20 (*)    All other components within normal limits  BETA-HYDROXYBUTYRIC ACID -  Abnormal; Notable for the following components:   Beta-Hydroxybutyric Acid 2.54 (*)    All other components within normal limits  HEPATIC FUNCTION PANEL - Abnormal; Notable for the following components:   Alkaline Phosphatase 127 (*)    Total Bilirubin 1.5 (*)    Bilirubin, Direct 0.4 (*)    Indirect Bilirubin 1.1 (*)    All other components within normal limits  MAGNESIUM - Abnormal; Notable for the following components:   Magnesium 1.6 (*)    All other components within normal limits  CBG MONITORING, ED - Abnormal; Notable for the following components:   Glucose-Capillary 489 (*)    All other components within normal limits  RESP PANEL BY RT-PCR (FLU A&B, COVID) ARPGX2  CBC  BLOOD GAS, VENOUS  HEMOGLOBIN A1C  ETHANOL  CBG MONITORING, ED  CBG MONITORING, ED   ____________________________________________  EKG  ____________________________________________  RADIOLOGY  ED MD interpretation:    Official radiology report(s): No results found.  ____________________________________________   PROCEDURES  Procedure(s) performed (including Critical Care):  .1-3 Lead EKG Interpretation Performed by: Gilles Chiquito, MD Authorized by: Gilles Chiquito, MD     Interpretation: normal     ECG rate assessment: normal     Rhythm: sinus rhythm     Ectopy: none     Conduction: normal       ____________________________________________   INITIAL IMPRESSION / ASSESSMENT AND  PLAN / ED COURSE      Patient presents with above to history exam for assessment of some weight loss and elevated blood sugars after referred to the ED by his PCP.  It seems patient has been out of his medicines for some time most of them for at least a week and a half-Metformin at least a month.  Patient unable to clearly state why he did not get it refilled.  He is afebrile and hypertensive with otherwise stable vital signs on room air.  Has a nonfocal neuro exam and appears underweight but otherwise is oriented and alert and denies any other acute concerns.  Suspect patient's weight loss and sugars are secondary to him not taking his Metformin as described he has likely had some muscle wasting.  No history or exam findings to suggest recent trauma or acute infectious process.  Patient is not suicidal homicidal or psychotic.  He is not drinking Evalose patient for other illicit drug use.  He denies any other acute new focal symptoms and low suspicion for CVA or any other clear organic etiology for patient not taking his medications other than poor follow-through with his PCP at this time.   BMP remarkable for glucose of 520 with a pseudohyponatremia at 126 which corrects to normal after to consideration patient's glucose and no other significant electrolyte or metabolic derangements.  CBC has no leukocytosis or acute anemia.  Hepatic function panel is unremarkable not consistent with acute cholestasis or hepatitis.  Magnesium is low at 1.6.  UA has glucose and ketones but no evidence of infection.  VBG with a pH of 7.36 with PCO2 of 44 not consistent with DKA.  Beta hydroxybutyrate is 2.54.  Patient was given 2 L of IV fluid and 15 units of insulin and home meds were reordered.  On recheck of his fingerstick glucose was greater than 500.  Given patient seems to have very poor insight and has diabetes and he is fairly ketotic as well as hyperglycemic but without evidence of DKA I believe it is  reasonable for patient to be  admitted for observation to be started on an insulin regimen as he states he is not currently taking insulin at home and undergo a intensive diabetes education and hydration.  ____________________________________________   FINAL CLINICAL IMPRESSION(S) / ED DIAGNOSES  Final diagnoses:  Hyperglycemia  Ketosis (HCC)  Dehydration  Medication refill  Hypomagnesemia    Medications  insulin aspart (novoLOG) injection 0-15 Units (15 Units Subcutaneous Given 12/21/20 1832)  thiamine tablet 100 mg (100 mg Oral Not Given 12/21/20 1837)  simvastatin (ZOCOR) tablet 20 mg (has no administration in time range)  multivitamin with minerals tablet 1 tablet (1 tablet Oral Not Given 12/21/20 1837)  metoprolol tartrate (LOPRESSOR) tablet 50 mg (has no administration in time range)  metFORMIN (GLUCOPHAGE) tablet 1,000 mg (1,000 mg Oral Not Given 12/21/20 1838)  folic acid (FOLVITE) tablet 1 mg (1 mg Oral Not Given 12/21/20 1838)  busPIRone (BUSPAR) tablet 10 mg (has no administration in time range)  clopidogrel (PLAVIX) tablet 75 mg (75 mg Oral Not Given 12/21/20 1838)  magnesium sulfate IVPB 2 g 50 mL (2 g Intravenous New Bag/Given 12/21/20 1923)  feeding supplement (GLUCERNA SHAKE) (GLUCERNA SHAKE) liquid 237 mL (has no administration in time range)  lisinopril (ZESTRIL) tablet 10 mg (has no administration in time range)  ferrous sulfate tablet 325 mg (has no administration in time range)  famotidine (PEPCID) tablet 20 mg (has no administration in time range)  aspirin EC tablet 81 mg (has no administration in time range)  lactated ringers bolus 1,000 mL (1,000 mLs Intravenous New Bag/Given 12/21/20 1833)  lactated ringers bolus 1,000 mL (1,000 mLs Intravenous New Bag/Given 12/21/20 1920)     ED Discharge Orders    None       Note:  This document was prepared using Dragon voice recognition software and may include unintentional dictation errors.   Gilles Chiquito,  MD 12/21/20 2011

## 2020-12-22 DIAGNOSIS — I10 Essential (primary) hypertension: Secondary | ICD-10-CM | POA: Diagnosis present

## 2020-12-22 DIAGNOSIS — Z794 Long term (current) use of insulin: Secondary | ICD-10-CM | POA: Diagnosis not present

## 2020-12-22 DIAGNOSIS — G40909 Epilepsy, unspecified, not intractable, without status epilepticus: Secondary | ICD-10-CM | POA: Diagnosis present

## 2020-12-22 DIAGNOSIS — Z7982 Long term (current) use of aspirin: Secondary | ICD-10-CM | POA: Diagnosis not present

## 2020-12-22 DIAGNOSIS — Z7984 Long term (current) use of oral hypoglycemic drugs: Secondary | ICD-10-CM | POA: Diagnosis not present

## 2020-12-22 DIAGNOSIS — Z7902 Long term (current) use of antithrombotics/antiplatelets: Secondary | ICD-10-CM | POA: Diagnosis not present

## 2020-12-22 DIAGNOSIS — E1165 Type 2 diabetes mellitus with hyperglycemia: Secondary | ICD-10-CM | POA: Diagnosis present

## 2020-12-22 DIAGNOSIS — F1010 Alcohol abuse, uncomplicated: Secondary | ICD-10-CM | POA: Diagnosis not present

## 2020-12-22 DIAGNOSIS — R0989 Other specified symptoms and signs involving the circulatory and respiratory systems: Secondary | ICD-10-CM | POA: Diagnosis present

## 2020-12-22 DIAGNOSIS — E1100 Type 2 diabetes mellitus with hyperosmolarity without nonketotic hyperglycemic-hyperosmolar coma (NKHHC): Secondary | ICD-10-CM | POA: Diagnosis present

## 2020-12-22 DIAGNOSIS — F17210 Nicotine dependence, cigarettes, uncomplicated: Secondary | ICD-10-CM | POA: Diagnosis present

## 2020-12-22 DIAGNOSIS — R739 Hyperglycemia, unspecified: Secondary | ICD-10-CM | POA: Diagnosis present

## 2020-12-22 DIAGNOSIS — E871 Hypo-osmolality and hyponatremia: Secondary | ICD-10-CM | POA: Diagnosis present

## 2020-12-22 DIAGNOSIS — Z9111 Patient's noncompliance with dietary regimen: Secondary | ICD-10-CM | POA: Diagnosis not present

## 2020-12-22 DIAGNOSIS — K219 Gastro-esophageal reflux disease without esophagitis: Secondary | ICD-10-CM | POA: Diagnosis present

## 2020-12-22 DIAGNOSIS — F1020 Alcohol dependence, uncomplicated: Secondary | ICD-10-CM | POA: Diagnosis present

## 2020-12-22 DIAGNOSIS — I358 Other nonrheumatic aortic valve disorders: Secondary | ICD-10-CM | POA: Diagnosis present

## 2020-12-22 DIAGNOSIS — Z8673 Personal history of transient ischemic attack (TIA), and cerebral infarction without residual deficits: Secondary | ICD-10-CM | POA: Diagnosis not present

## 2020-12-22 DIAGNOSIS — E860 Dehydration: Secondary | ICD-10-CM | POA: Diagnosis present

## 2020-12-22 DIAGNOSIS — J439 Emphysema, unspecified: Secondary | ICD-10-CM | POA: Diagnosis present

## 2020-12-22 DIAGNOSIS — Z79899 Other long term (current) drug therapy: Secondary | ICD-10-CM | POA: Diagnosis not present

## 2020-12-22 DIAGNOSIS — Z20822 Contact with and (suspected) exposure to covid-19: Secondary | ICD-10-CM | POA: Diagnosis present

## 2020-12-22 DIAGNOSIS — E785 Hyperlipidemia, unspecified: Secondary | ICD-10-CM | POA: Diagnosis present

## 2020-12-22 DIAGNOSIS — I2510 Atherosclerotic heart disease of native coronary artery without angina pectoris: Secondary | ICD-10-CM | POA: Diagnosis not present

## 2020-12-22 DIAGNOSIS — M4640 Discitis, unspecified, site unspecified: Secondary | ICD-10-CM | POA: Diagnosis present

## 2020-12-22 DIAGNOSIS — E119 Type 2 diabetes mellitus without complications: Secondary | ICD-10-CM | POA: Diagnosis not present

## 2020-12-22 DIAGNOSIS — K861 Other chronic pancreatitis: Secondary | ICD-10-CM | POA: Diagnosis present

## 2020-12-22 LAB — CBC
HCT: 34.8 % — ABNORMAL LOW (ref 39.0–52.0)
Hemoglobin: 11.6 g/dL — ABNORMAL LOW (ref 13.0–17.0)
MCH: 32.1 pg (ref 26.0–34.0)
MCHC: 33.3 g/dL (ref 30.0–36.0)
MCV: 96.4 fL (ref 80.0–100.0)
Platelets: 252 10*3/uL (ref 150–400)
RBC: 3.61 MIL/uL — ABNORMAL LOW (ref 4.22–5.81)
RDW: 13.8 % (ref 11.5–15.5)
WBC: 8.8 10*3/uL (ref 4.0–10.5)
nRBC: 0 % (ref 0.0–0.2)

## 2020-12-22 LAB — COMPREHENSIVE METABOLIC PANEL
ALT: 14 U/L (ref 0–44)
AST: 16 U/L (ref 15–41)
Albumin: 3.1 g/dL — ABNORMAL LOW (ref 3.5–5.0)
Alkaline Phosphatase: 107 U/L (ref 38–126)
Anion gap: 6 (ref 5–15)
BUN: 15 mg/dL (ref 6–20)
CO2: 24 mmol/L (ref 22–32)
Calcium: 8.7 mg/dL — ABNORMAL LOW (ref 8.9–10.3)
Chloride: 98 mmol/L (ref 98–111)
Creatinine, Ser: 0.51 mg/dL — ABNORMAL LOW (ref 0.61–1.24)
GFR, Estimated: >60 mL/min (ref >60)
Glucose, Bld: 336 mg/dL — ABNORMAL HIGH (ref 70–99)
Potassium: 4 mmol/L (ref 3.5–5.1)
Sodium: 128 mmol/L — ABNORMAL LOW (ref 135–145)
Total Bilirubin: 0.6 mg/dL (ref 0.3–1.2)
Total Protein: 6.1 g/dL — ABNORMAL LOW (ref 6.5–8.1)

## 2020-12-22 LAB — GLUCOSE, CAPILLARY
Glucose-Capillary: 231 mg/dL — ABNORMAL HIGH (ref 70–99)
Glucose-Capillary: 302 mg/dL — ABNORMAL HIGH (ref 70–99)
Glucose-Capillary: 310 mg/dL — ABNORMAL HIGH (ref 70–99)

## 2020-12-22 LAB — CBG MONITORING, ED
Glucose-Capillary: 257 mg/dL — ABNORMAL HIGH (ref 70–99)
Glucose-Capillary: 306 mg/dL — ABNORMAL HIGH (ref 70–99)
Glucose-Capillary: 373 mg/dL — ABNORMAL HIGH (ref 70–99)
Glucose-Capillary: 413 mg/dL — ABNORMAL HIGH (ref 70–99)

## 2020-12-22 LAB — HEMOGLOBIN A1C
Hgb A1c MFr Bld: 14.9 % — ABNORMAL HIGH (ref 4.8–5.6)
Mean Plasma Glucose: 380.93 mg/dL

## 2020-12-22 MED ORDER — INSULIN ASPART 100 UNIT/ML ~~LOC~~ SOLN: 5.0000 [IU] | Freq: Three times a day (TID) | SUBCUTANEOUS | Status: DC

## 2020-12-22 MED ORDER — INSULIN DETEMIR 100 UNIT/ML ~~LOC~~ SOLN
10.0000 [IU] | Freq: Two times a day (BID) | SUBCUTANEOUS | Status: DC
  Administered 2020-12-22 – 2020-12-23 (×3): 10 [IU] via SUBCUTANEOUS
  Filled 2020-12-22 (×5): qty 0.1

## 2020-12-22 MED ORDER — GLUCERNA SHAKE PO LIQD
237.0000 mL | Freq: Three times a day (TID) | ORAL | Status: DC
  Administered 2020-12-22 – 2020-12-23 (×4): 237 mL via ORAL

## 2020-12-22 MED ORDER — INSULIN ASPART 100 UNIT/ML ~~LOC~~ SOLN
5.0000 [IU] | Freq: Three times a day (TID) | SUBCUTANEOUS | Status: DC
  Administered 2020-12-22 – 2020-12-23 (×4): 5 [IU] via SUBCUTANEOUS
  Filled 2020-12-22 (×3): qty 1

## 2020-12-22 NOTE — Progress Notes (Addendum)
Inpatient Diabetes Program Recommendations  AACE/ADA: New Consensus Statement on Inpatient Glycemic Control (2015)  Target Ranges:  Prepandial:   less than 140 mg/dL      Peak postprandial:   less than 180 mg/dL (1-2 hours)      Critically ill patients:  140 - 180 mg/dL   Results for DENHAM, MOSE (MRN [id number]) as of 12/22/2020 07:19  Ref. Range 12/21/2020 17:43  Glucose Latest Ref Range: 70 - 99 mg/dL [facility])   Results for ROCHELLE, NEPHEW (MRN [id number]) as of 12/22/2020 07:19  Ref. Range 12/21/2020 17:38 12/21/2020 19:22 12/21/2020 22:37 12/22/2020 00:04 12/22/2020 04:41  Glucose-Capillary Latest Ref Range: 70 - 99 mg/dL 489 (H)  15 units NOVOLOG $RemoveBefor'@6'WJYrSiyblnUx$ :32pm  2L LR given 519 (HH) 318 (H) 413 (H) 306 (H)   Results for SAVON, BORDONARO (MRN [id number]) as of 12/22/2020 11:32  Ref. Range 12/22/2020 07:26  Glucose-Capillary Latest Ref Range: 70 - 99 mg/dL 257 (H)   Admit with: Hyperglycemia (out of meds for 2 weeks)   History: DM, CVA, ETOH Dependence  Home DM Meds: Metformin 1000 mg BID       Novolog 3 units TID (NOT taking)       Novolog 0-15 units TID per SSI (NOT taking)       Levemir 10 units BID (NOT taking)       Looks like pt was discharged to SNF back in May 2021 with Levemir and Novolog  Current Orders: Levemir 10 units BID      Novolog Sensitive Correction Scale/ SSI (0-9 units) TID AC    PCP: Threasa Alpha, FNP  Current A1c pending  Levemir and Novolog SSI both to start this AM   Met with pt down in the ED this AM.  Pt told me he was at the [facility] for 1 month last year for antibiotics IV.  Sent home sometime late June after 30 day stay and was given Rxs for 2 Insulins (I presume Levemir and Novolog since that was what he was discharged on to the SNF back in May 2021).  Pt could not remember the names of the insulins or the doses.  Filled the Insulins but never stored them in the fridge (have been at room temp for over 9 months).  Told me no one ever showed him how  to use the insulin so he never took it.  Has CBG meter at home but doesn't like it so never checking CBGs.  Lost 20 pounds over the last several months and stated he has been eating everything he can and still losing weight.  I assume he has been losing weight due to chronic hyperglycemia but have no way to tell for sure since he doesn't check CBGs.  Lives with sister but sister does not participate in his home care.  Pt states he is charge of his meds at home.  Ran out of all his meds (Metformin + BP meds) about 2 weeks ago.  Explained what an A1c is and what it measures--A1c currently pending--expect it to be elevated and relayed this to pt.  Reminded patient that his goal A1c is 7% or less per ADA standards to prevent both acute and long-term complications.  Explained to patient the extreme importance of good glucose control at home.  Encouraged patient to check his CBGs at least bid at home (fasting and another check within the day) and to record all CBGs in a logbook for his PCP to review.  Also reviewed  goal CBGs with pt.  Educated patient on insulin pen use at home.  Reviewed all steps of insulin pen including attachment of needle, 2-unit air shot, dialing up dose, giving injection, rotation of injection sites, removing needle, disposal of sharps, storage of unused insulin, disposal of insulin etc.  Patient able to provide return demonstration with some prompting, however, pt seemed as if he may not be understanding the severity of his diabetes and the importance of taking insulin at home--Unsure he will be adherent with insulin at home.  Expressed to Dr. Cruzita Lederer my concerns and requested Insulin regimen be simple Daily injection.  Also reviewed all of the above info with Dr. Cruzita Lederer and requested we give pt insulin pens and insulin pen needles for ease at home. Instructed pt to throw away all the insulin he has at home since it has been stored outside of the fridge for over 9 months and is possibly  denatured.  Reviewed troubleshooting with insulin pen.  Also reviewed Signs/Symptoms of Hypoglycemia with patient and how to treat Hypoglycemia at home.  Attempted to call pt's sister at his request to review the above info and to let her know pt may be discharged home today.  Sister did not answer.  Did not leave voicemail as I was not currently at my office phone (was down in the ED).  RN aware that pt wants [place] to call sister and let her know if he will be discharged today.         --Will follow patient during hospitalization--  Wyn Quaker RN, MSN, CDE Diabetes Coordinator Inpatient Glycemic Control Team Team Pager: [phone] (8a-5p)

## 2020-12-22 NOTE — ED Notes (Signed)
Patient is resting comfortably. 

## 2020-12-22 NOTE — ED Notes (Signed)
Patient is resting comfortably. Fall precautions in place. Call light in reach.  

## 2020-12-22 NOTE — Progress Notes (Signed)
Initial Nutrition Assessment  DOCUMENTATION CODES:   Severe malnutrition in context of chronic illness  INTERVENTION:   Glucerna Shake po TID, each supplement provides 220 kcal and 10 grams of protein  MVI with minerals  Encourage PO intake   Reviewed DM guidelines and reinforced medication compliance   Outpatient referral for DM  NUTRITION DIAGNOSIS:   Severe Malnutrition related to chronic illness (uncontrolled DM) as evidenced by severe muscle depletion,severe fat depletion.  GOAL:   Patient will meet greater than or equal to 90% of their needs  MONITOR:   PO intake,Supplement acceptance  REASON FOR ASSESSMENT:   Malnutrition Screening Tool    ASSESSMENT:   Pt with PMH of CVA, ETOH abuse, tobacco abuse, chronic pancreatitis, HTN, HLD, DM admitted from PCP office for hyperglycemia.    Per pt report he only takes metformin at home for his DM although he had been prescribed insulin in the past he had not been taking it.  Spoke with pt and sister. Pt reports he had not wanted to stick himself with needles and was not taking insulin PTA. Explained how insulin works in the body and explained how lack of insulin would lead to weight loss.  Per pt his usual weight is 185 lb about 10 months ago and he is now down to 131 lb this admission. 30% weight loss x 10 months. Pt has pants on currently that are extremely baggy, he reports none of his clothes fit him anymore.  Pt reports great appetite, eating at least 3 meals per day and drinking Boost that sister gets for him.   Medications reviewed and include: ferrous sulfate, folic acid, SSI, novolog TID with meals, levemir, MVI with minerals, thiamine  NS @ 50 ml/hr Labs reviewed: Na 128, A1C: 14.9 CBG's: 257-373    NUTRITION - FOCUSED PHYSICAL EXAM:  Flowsheet Row Most Recent Value  Orbital Region Severe depletion  Upper Arm Region Severe depletion  Thoracic and Lumbar Region Severe depletion  Buccal Region Severe  depletion  Temple Region Severe depletion  Clavicle Bone Region Severe depletion  Clavicle and Acromion Bone Region Severe depletion  Scapular Bone Region Severe depletion  Dorsal Hand Severe depletion  Patellar Region Severe depletion  Anterior Thigh Region Severe depletion  Posterior Calf Region Severe depletion  Edema (RD Assessment) None  Hair Reviewed  Eyes Reviewed  Mouth Reviewed  Skin Reviewed  Nails Reviewed       Diet Order:   Diet Order            Diet Carb Modified Fluid consistency: Thin; Room service appropriate? Yes  Diet effective now                 EDUCATION NEEDS:   Education needs have been addressed  Skin:  Skin Assessment: Reviewed RN Assessment  Last BM:  unknown  Height:   Ht Readings from Last 1 Encounters:  12/21/20 6' (1.829 m)    Weight:   Wt Readings from Last 1 Encounters:  12/21/20 59.4 kg    Ideal Body Weight:  80.9 kg  BMI:  Body mass index is 17.77 kg/m.  Estimated Nutritional Needs:   Kcal:  1900-2100  Protein:  95-115 grams  Fluid:  > 1.9 L/day  Cammy Copa., RD, LDN, CNSC See AMiON for contact information

## 2020-12-22 NOTE — Progress Notes (Signed)
PROGRESS NOTE  STORM SOVINE BEE:[id number] DOB: [birth year] DOA: 12/21/2020 PCP: Armando Gang, FNP   LOS: 0 days   Brief Narrative / Interim history: 55 year old male with history of prior CVA, EtOH use, tobacco use, chronic pancreatitis, hypertension, hyperlipidemia, diabetes mellitus, sent from the PCPs office with hyperglycemia.  Patient reportedly ran out of his home medications about 2 weeks ago.  He tells me he takes Metformin only.  He notes he has been prescribed insulin in the past due to poorly controlled diabetes but he does not feel he needs this.  He says that Metformin "works just fine".  He saw his PCP to have refills for his Metformin, was found to have blood sugars in the 500s and was sent to the ED  Subjective / 24h Interval events: Has no complaints on my evaluation, denies any chest pain, no abdominal pain, no nausea or vomiting.  Very flat affect.  Assessment & Plan: Principal Problem Hyperglycemic state, no DKA in the setting of poorly controlled diabetes mellitus with hyperglycemia -Patient's hemoglobin A1c was found to be 14.9, last year it was 7.1.  He has been prescribed insulin sometimes last year but it appears that he has been keeping it on his counter for the past 6 months and not using it.  He reports one episode of becoming hypoglycemic after having insulin but cannot elaborate. -He seems to have fairly poor insight into his medical condition, and really wants to avoid insulin.  Given elevated hemoglobin A1c I had a long discussion with him as well as with diabetes coordinator today.  He is agreeable to do insulin once daily.  He has had some training today with pens and this will be prescribed to him on discharge -CBGs better today but but still high and starting to rise.  I will adjust his mealtime scheduled insulin and continue to monitor. -He also reports dietary noncompliance is eating fairly large amount of carrot cake icing by himself over the past  weekend  Active Problems Essential hypertension-continue home medications CAD-no chest pain, he is on metoprolol, Zocor, aspirin and Plavix Prior CVA-continue antiplatelets as per home regimen Hyperlipidemia-continue statin Tobacco abuse-nicotine patch EtOH-apparently has not been used in a while, EtOH level negative.  Closely monitor Abdominal pain-left flank, no guarding or rebound, patient reports history of chronic pain in the setting of chronic pancreatitis.  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 aspirin EC  81 mg Oral Daily  . busPIRone  10 mg Oral BID  . clopidogrel  75 mg Oral Daily  . famotidine  20 mg Oral BID  . ferrous sulfate  325 mg Oral Daily  . folic acid  1 mg Oral Daily  . insulin aspart  0-9 Units Subcutaneous TID WC  . insulin aspart  5 Units Subcutaneous TID WC  . insulin detemir  10 Units Subcutaneous BID  . lisinopril  10 mg Oral Daily  . metoprolol tartrate  50 mg Oral BID  . multivitamin with minerals  1 tablet Oral Daily  . nicotine  14 mg Transdermal Daily  . pantoprazole (PROTONIX) IV  40 mg Intravenous Q24H  . simvastatin  20 mg Oral q1800  . thiamine  100 mg Oral Daily  . traZODone  150 mg Oral QHS   Continuous Infusions: . sodium chloride 50 mL/hr at 12/22/20 0705   PRN Meds:.  Diet Orders (From admission, onward)  Start     Ordered   12/21/20 2058  Diet Carb Modified Fluid consistency: Thin; Room service appropriate? Yes  Diet effective now       Question Answer Comment  Diet-HS Snack? Nothing   Calorie Level Medium 1600-2000   Fluid consistency: Thin   Room service appropriate? Yes      12/21/20 2057          DVT prophylaxis: SCDs Start: 12/21/20 2058     Code Status: Full Code  Family Communication: no family at bedside, unable to reach sister  Status is: Inpatient  Remains inpatient appropriate because:Inpatient level  of care appropriate due to severity of illness   Dispo: The patient is from: Home              Anticipated d/c is to: Home              Patient currently is not medically stable to d/c.   Difficult to place patient No  Level of care: Med-Surg  Consultants:  none  Procedures:  none  Microbiology  none  Antimicrobials: none    Objective: Vitals:   12/22/20 0009 12/22/20 0445 12/22/20 0600 12/22/20 0728  BP: (!) 164/88 (!) 153/87 (!) 158/96 107/68  Pulse: 72 65 70 79  Resp: 18 18 18 16   Temp:    98.2 F (36.8 C)  TempSrc:    Oral  SpO2: 98% 92% 93% 100%  Weight:      Height:       No intake or output data in the 24 hours ending 12/22/20 1214 Filed Weights   12/21/20 1739  Weight: 59.4 kg    Examination:  Constitutional: NAD Eyes: no scleral icterus ENMT: Mucous membranes are moist.  Neck: normal, supple Respiratory: clear to auscultation bilaterally, no wheezing, no crackles. Normal respiratory effort. No accessory muscle use.  Cardiovascular: Regular rate and rhythm, no murmurs / rubs / gallops. No LE edema.  Abdomen: non distended, no tenderness. Bowel sounds positive.  Musculoskeletal: no clubbing / cyanosis.  Skin: no rashes Neurologic: CN 2-12 grossly intact. Strength 5/5 in all 4.   Data Reviewed: I have independently reviewed following labs and imaging studies   CBC: Recent Labs  Lab 12/21/20 1743 12/22/20 0436  WBC 9.9 8.8  HGB 13.8 11.6*  HCT 41.9 34.8*  MCV 96.8 96.4  PLT 287 252   Basic Metabolic Panel: Recent Labs  Lab 12/21/20 1743 12/22/20 0436  NA 126* 128*  K 4.9 4.0  CL 92* 98  CO2 22 24  GLUCOSE 520* 336*  BUN 12 15  CREATININE 0.76 0.51*  CALCIUM 9.3 8.7*  MG 1.6*  --    Liver Function Tests: Recent Labs  Lab 12/21/20 1743 12/22/20 0436  AST 27 16  ALT 16 14  ALKPHOS 127* 107  BILITOT 1.5* 0.6  PROT 7.8 6.1*  ALBUMIN 3.9 3.1*   Coagulation Profile: No results for input(s): INR, PROTIME in the last 168  hours. HbA1C: Recent Labs    12/22/20 0736  HGBA1C 14.9*   CBG: Recent Labs  Lab 12/21/20 2237 12/22/20 0004 12/22/20 0441 12/22/20 0726 12/22/20 1209  GLUCAP 318* 413* 306* 257* 373*    Recent Results (from the past 240 hour(s))  Resp Panel by RT-PCR (Flu A&B, Covid) Nasopharyngeal Swab     Status: None   Collection Time: 12/21/20  7:25 PM   Specimen: Nasopharyngeal Swab; Nasopharyngeal(NP) swabs in vial transport medium  Result Value Ref Range Status   SARS Coronavirus  2 by RT PCR NEGATIVE NEGATIVE Final    Comment: (NOTE) SARS-CoV-2 target nucleic acids are NOT DETECTED.  The SARS-CoV-2 RNA is generally detectable in upper respiratory specimens during the acute phase of infection. The lowest concentration of SARS-CoV-2 viral copies this assay can detect is 138 copies/mL. A negative result does not preclude SARS-Cov-2 infection and should not be used as the sole basis for treatment or other patient management decisions. A negative result may occur with  improper specimen collection/handling, submission of specimen other than nasopharyngeal swab, presence of viral mutation(s) within the areas targeted by this assay, and inadequate number of viral copies(<138 copies/mL). A negative result must be combined with clinical observations, patient history, and epidemiological information. The expected result is Negative.  Fact Sheet for Patients:  BloggerCourse.com  Fact Sheet for Healthcare Providers:  SeriousBroker.it  This test is no t yet approved or cleared by the [place] FDA and  has been authorized for detection and/or diagnosis of SARS-CoV-2 by FDA under an Emergency Use Authorization (EUA). This EUA will remain  in effect (meaning this test can be used) for the duration of the COVID-19 declaration under Section 564(b)(1) of the Act, 21 U.S.C.section 360bbb-3(b)(1), unless the authorization is terminated  or  revoked sooner.       Influenza A by PCR NEGATIVE NEGATIVE Final   Influenza B by PCR NEGATIVE NEGATIVE Final    Comment: (NOTE) The Xpert Xpress SARS-CoV-2/FLU/RSV plus assay is intended as an aid in the diagnosis of influenza from Nasopharyngeal swab specimens and should not be used as a sole basis for treatment. Nasal washings and aspirates are unacceptable for Xpert Xpress SARS-CoV-2/FLU/RSV testing.  Fact Sheet for Patients: BloggerCourse.com  Fact Sheet for Healthcare Providers: SeriousBroker.it  This test is not yet approved or cleared by the [place] FDA and has been authorized for detection and/or diagnosis of SARS-CoV-2 by FDA under an Emergency Use Authorization (EUA). This EUA will remain in effect (meaning this test can be used) for the duration of the COVID-19 declaration under Section 564(b)(1) of the Act, 21 U.S.C. section 360bbb-3(b)(1), unless the authorization is terminated or revoked.  Performed at [facility], [address] [place], [place] [address]      Radiology Studies: No results found.   Time spent: 35 minutes, 2 separate visits, extensive bedside counseling regarding diabetes   Pamella Pert, MD, PhD [facility]  Between 7 am - 7 pm I am available, please contact me via Amion or Securechat  Between 7 pm - 7 am I am not available, please contact night coverage MD/APP via Amion

## 2020-12-22 NOTE — ED Notes (Signed)
Pt resting quietly. Fall precautions in place. Call light in reach.

## 2020-12-23 DIAGNOSIS — E43 Unspecified severe protein-calorie malnutrition: Secondary | ICD-10-CM | POA: Insufficient documentation

## 2020-12-23 DIAGNOSIS — F1010 Alcohol abuse, uncomplicated: Secondary | ICD-10-CM | POA: Diagnosis not present

## 2020-12-23 DIAGNOSIS — E1165 Type 2 diabetes mellitus with hyperglycemia: Secondary | ICD-10-CM | POA: Diagnosis not present

## 2020-12-23 DIAGNOSIS — E119 Type 2 diabetes mellitus without complications: Secondary | ICD-10-CM | POA: Diagnosis not present

## 2020-12-23 DIAGNOSIS — I2510 Atherosclerotic heart disease of native coronary artery without angina pectoris: Secondary | ICD-10-CM | POA: Diagnosis not present

## 2020-12-23 LAB — BASIC METABOLIC PANEL
Anion gap: 8 (ref 5–15)
BUN: 13 mg/dL (ref 6–20)
CO2: 24 mmol/L (ref 22–32)
Calcium: 9.2 mg/dL (ref 8.9–10.3)
Chloride: 99 mmol/L (ref 98–111)
Creatinine, Ser: 0.62 mg/dL (ref 0.61–1.24)
GFR, Estimated: >60 mL/min (ref >60)
Glucose, Bld: 169 mg/dL — ABNORMAL HIGH (ref 70–99)
Potassium: 3.7 mmol/L (ref 3.5–5.1)
Sodium: 131 mmol/L — ABNORMAL LOW (ref 135–145)

## 2020-12-23 LAB — GLUCOSE, CAPILLARY
Glucose-Capillary: 152 mg/dL — ABNORMAL HIGH (ref 70–99)
Glucose-Capillary: 360 mg/dL — ABNORMAL HIGH (ref 70–99)

## 2020-12-23 MED ORDER — METFORMIN HCL 1000 MG PO TABS: 1000.0000 mg | ORAL_TABLET | Freq: Two times a day (BID) | ORAL | 0 refills | Status: DC

## 2020-12-23 MED ORDER — PEN NEEDLES 31G X 5 MM MISC: 1.0000 | Freq: Every day | 1 refills | Status: DC

## 2020-12-23 MED ORDER — CLOPIDOGREL BISULFATE 75 MG PO TABS: 75.0000 mg | ORAL_TABLET | Freq: Every day | ORAL | 0 refills | Status: DC

## 2020-12-23 MED ORDER — BUSPIRONE HCL 10 MG PO TABS: 10.0000 mg | ORAL_TABLET | Freq: Two times a day (BID) | ORAL | 0 refills | Status: DC

## 2020-12-23 MED ORDER — ALBUTEROL SULFATE (2.5 MG/3ML) 0.083% IN NEBU: 2.5000 mg | INHALATION_SOLUTION | Freq: Four times a day (QID) | RESPIRATORY_TRACT | 12 refills | Status: DC | PRN

## 2020-12-23 MED ORDER — PRO-STAT SUGAR FREE PO LIQD: 30.0000 mL | Freq: Two times a day (BID) | ORAL | 0 refills | Status: AC

## 2020-12-23 MED ORDER — SIMVASTATIN 20 MG PO TABS: 20.0000 mg | ORAL_TABLET | Freq: Every day | ORAL | 0 refills | Status: DC

## 2020-12-23 MED ORDER — NICOTINE 14 MG/24HR TD PT24: 14.0000 mg | MEDICATED_PATCH | Freq: Every day | TRANSDERMAL | 0 refills | Status: DC

## 2020-12-23 MED ORDER — FAMOTIDINE 20 MG PO TABS: 20.0000 mg | ORAL_TABLET | Freq: Two times a day (BID) | ORAL | 0 refills | Status: DC

## 2020-12-23 MED ORDER — FOLIC ACID 1 MG PO TABS: 1.0000 mg | ORAL_TABLET | Freq: Every day | ORAL | 0 refills | Status: DC

## 2020-12-23 MED ORDER — GLUCERNA SHAKE PO LIQD: 237.0000 mL | Freq: Three times a day (TID) | ORAL | 0 refills | Status: AC

## 2020-12-23 MED ORDER — CYCLOBENZAPRINE HCL 10 MG PO TABS: 10.0000 mg | ORAL_TABLET | Freq: Two times a day (BID) | ORAL | 0 refills | Status: DC

## 2020-12-23 MED ORDER — ADULT MULTIVITAMIN W/MINERALS CH: 1.0000 | ORAL_TABLET | Freq: Every day | ORAL | 0 refills | Status: DC

## 2020-12-23 MED ORDER — FEROSUL 325 (65 FE) MG PO TABS: 325.0000 mg | ORAL_TABLET | Freq: Every day | ORAL | 0 refills | Status: DC

## 2020-12-23 MED ORDER — ASPIRIN LOW DOSE 81 MG PO TBEC: 81.0000 mg | DELAYED_RELEASE_TABLET | Freq: Every day | ORAL | 0 refills | Status: DC

## 2020-12-23 MED ORDER — TRAZODONE HCL 150 MG PO TABS: 150.0000 mg | ORAL_TABLET | Freq: Every day | ORAL | 0 refills | Status: DC

## 2020-12-23 MED ORDER — METOPROLOL TARTRATE 50 MG PO TABS: 50.0000 mg | ORAL_TABLET | Freq: Two times a day (BID) | ORAL | 0 refills | Status: DC

## 2020-12-23 MED ORDER — INSULIN DETEMIR 100 UNIT/ML FLEXPEN: 12.0000 [IU] | PEN_INJECTOR | Freq: Every day | SUBCUTANEOUS | 11 refills | Status: DC

## 2020-12-23 MED ORDER — LISINOPRIL 10 MG PO TABS: 10.0000 mg | ORAL_TABLET | Freq: Every day | ORAL | 0 refills | Status: DC

## 2020-12-23 NOTE — Discharge Summary (Addendum)
Physician Discharge Summary  Brandon [name] URK:[id number] DOB: May 13, 1966 DOA: 12/21/2020  PCP: Armando Gang, FNP  Admit date: 12/21/2020 Discharge date: 12/23/2020  Admitted From: home Disposition:  home  Recommendations for Outpatient Follow-up:  1. Follow up with PCP in 1-2 weeks  Home Health: none Equipment/Devices: none  Discharge Condition: stable CODE STATUS: Full code Diet recommendation: diabetic   HPI: Per admitting MD, Brandon [name] is a 55 y.o. male seen in ed with complaints of Hyperglycemia. Pt's PCP sent patient to the emergency room for hyperglycemia and poor diabetes control. Patient has poor insight into his medical problems and has not been taking his Metformin.  Patient reports that he has been out of medicines for about a week for 2 weeks.  Patient also denies any alcohol.  He does report smoking.  Hospital Course / Discharge diagnoses: Principal Problem Hyperglycemic state, no DKA, in the setting of poorly controlled diabetes mellitus with hyperglycemia -Patient's hemoglobin A1c was found to be 14.9, last year it was 7.1.  He has been prescribed insulin sometimes last year but it appears that he has been keeping it on his counter for the past 6 months and not using it.  He reports one episode of becoming hypoglycemic after having insulin but cannot elaborate. He seems to have fairly poor insight into his medical condition, and really wants to avoid insulin, however given how high his A1C he is agreeable to only one shot per day. Diabetes educator consulted and discussed extensivelu with the patient as well.  CBGs now controlled, wil lbe discharged home in stable condition, was advised to check his CBGs regularly 4 times daily and follow up with PCP in 1 week  Active Problems Essential hypertension-continue home medications CAD-no chest pain, he is on metoprolol, Zocor, aspirin and Plavix Prior CVA-continue antiplatelets as per home  regimen Hyperlipidemia-continue statin Tobacco abuse-nicotine patch EtOH-apparently has not been used in a while, EtOH level negative.  Closely monitor Abdominal pain-left flank, no guarding or rebound, patient reports history of chronic pain in the setting of chronic pancreatitis.  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, he has completed treatment  Sepsis ruled out   Discharge Instructions  Discharge Instructions    Amb Referral to Nutrition and Diabetic Education   Complete by: As directed      Allergies as of 12/23/2020      Reactions   No Known Allergies       Medication List    STOP taking these medications   insulin aspart 100 UNIT/ML injection Commonly known as: novoLOG   ranitidine 150 MG capsule Commonly known as: ZANTAC     TAKE these medications   albuterol (2.5 MG/3ML) 0.083% nebulizer solution Commonly known as: PROVENTIL Take 3 mLs (2.5 mg total) by nebulization every 6 (six) hours as needed for wheezing or shortness of breath.   Aspirin Low Dose 81 MG EC tablet Generic drug: aspirin Take 1 tablet (81 mg total) by mouth daily.   busPIRone 10 MG tablet Commonly known as: BUSPAR Take 1 tablet (10 mg total) by mouth 2 (two) times daily.   clopidogrel 75 MG tablet Commonly known as: PLAVIX Take 1 tablet (75 mg total) by mouth daily.   cyclobenzaprine 10 MG tablet Commonly known as: FLEXERIL Take 1 tablet (10 mg total) by mouth every 12 (twelve) hours.   famotidine 20 MG tablet Commonly known as: PEPCID Take 1 tablet (20 mg total) by mouth  2 (two) times daily.   feeding supplement (GLUCERNA SHAKE) Liqd Take 237 mLs by mouth 3 (three) times daily between meals.   feeding supplement (PRO-STAT SUGAR FREE 64) Liqd Take 30 mLs by mouth 2 (two) times daily for 15 days.   FeroSul 325 (65 FE) MG tablet Generic drug: ferrous sulfate Take 1 tablet (325 mg total) by mouth daily.   folic acid 1 MG  tablet Commonly known as: FOLVITE Take 1 tablet (1 mg total) by mouth daily.   insulin detemir 100 UNIT/ML FlexPen Commonly known as: LEVEMIR Inject 12 Units into the skin at bedtime. What changed:   how much to take  when to take this   lisinopril 10 MG tablet Commonly known as: ZESTRIL Take 1 tablet (10 mg total) by mouth daily.   metFORMIN 1000 MG tablet Commonly known as: GLUCOPHAGE Take 1 tablet (1,000 mg total) by mouth 2 (two) times daily with a meal.   metoprolol tartrate 50 MG tablet Commonly known as: LOPRESSOR Take 1 tablet (50 mg total) by mouth 2 (two) times daily.   multivitamin with minerals Tabs tablet Take 1 tablet by mouth daily.   nicotine 14 mg/24hr patch Commonly known as: NICODERM CQ - dosed in mg/24 hours Place 1 patch (14 mg total) onto the skin daily.   Pen Needles 31G X 5 MM Misc 1 each by Does not apply route daily.   senna-docusate 8.6-50 MG tablet Commonly known as: Senokot-S Take 1 tablet by mouth at bedtime.   simvastatin 20 MG tablet Commonly known as: ZOCOR Take 1 tablet (20 mg total) by mouth daily at 6 PM.   traZODone 150 MG tablet Commonly known as: DESYREL Take 1 tablet (150 mg total) by mouth at bedtime.       Follow-up Information    Armando Gang, FNP. Schedule an appointment as soon as possible for a visit in 1 week(s).   Specialty: Family Medicine Contact information: [address] [place] [address] [phone]               Consultations:  none  Procedures/Studies:  No results found.   Subjective: - no chest pain, shortness of breath, no abdominal pain, nausea or vomiting.   Discharge Exam: BP 126/76 (BP Location: Left Arm)   Pulse 61   Temp 97.6 F (36.4 C) (Oral)   Resp 16   Ht 6' (1.829 m)   Wt 59.4 kg   SpO2 100%   BMI 17.77 kg/m   General: Pt is alert, awake, not in acute distress Cardiovascular: RRR, S1/S2 +, no rubs, no gallops Respiratory: CTA bilaterally, no  wheezing, no rhonchi Abdominal: Soft, NT, ND, bowel sounds + Extremities: no edema, no cyanosis    The results of significant diagnostics from this hospitalization (including imaging, microbiology, ancillary and laboratory) are listed below for reference.     Microbiology: Recent Results (from the past 240 hour(s))  Resp Panel by RT-PCR (Flu A&B, Covid) Nasopharyngeal Swab     Status: None   Collection Time: 12/21/20  7:25 PM   Specimen: Nasopharyngeal Swab; Nasopharyngeal(NP) swabs in vial transport medium  Result Value Ref Range Status   SARS Coronavirus 2 by RT PCR NEGATIVE NEGATIVE Final    Comment: (NOTE) SARS-CoV-2 target nucleic acids are NOT DETECTED.  The SARS-CoV-2 RNA is generally detectable in upper respiratory specimens during the acute phase of infection. The lowest concentration of SARS-CoV-2 viral copies this assay can detect is 138 copies/mL. A negative result does not preclude  SARS-Cov-2 infection and should not be used as the sole basis for treatment or other patient management decisions. A negative result may occur with  improper specimen collection/handling, submission of specimen other than nasopharyngeal swab, presence of viral mutation(s) within the areas targeted by this assay, and inadequate number of viral copies(<138 copies/mL). A negative result must be combined with clinical observations, patient history, and epidemiological information. The expected result is Negative.  Fact Sheet for Patients:  BloggerCourse.comhttps://www.fda.gov/media/152166/download  Fact Sheet for Healthcare Providers:  SeriousBroker.ithttps://www.fda.gov/media/152162/download  This test is no t yet approved or cleared by the [place] FDA and  has been authorized for detection and/or diagnosis of SARS-CoV-2 by FDA under an Emergency Use Authorization (EUA). This EUA will remain  in effect (meaning this test can be used) for the duration of the COVID-19 declaration under Section 564(b)(1) of the Act,  21 U.S.C.section 360bbb-3(b)(1), unless the authorization is terminated  or revoked sooner.       Influenza A by PCR NEGATIVE NEGATIVE Final   Influenza B by PCR NEGATIVE NEGATIVE Final    Comment: (NOTE) The Xpert Xpress SARS-CoV-2/FLU/RSV plus assay is intended as an aid in the diagnosis of influenza from Nasopharyngeal swab specimens and should not be used as a sole basis for treatment. Nasal washings and aspirates are unacceptable for Xpert Xpress SARS-CoV-2/FLU/RSV testing.  Fact Sheet for Patients: BloggerCourse.comhttps://www.fda.gov/media/152166/download  Fact Sheet for Healthcare Providers: SeriousBroker.ithttps://www.fda.gov/media/152162/download  This test is not yet approved or cleared by the [place] FDA and has been authorized for detection and/or diagnosis of SARS-CoV-2 by FDA under an Emergency Use Authorization (EUA). This EUA will remain in effect (meaning this test can be used) for the duration of the COVID-19 declaration under Section 564(b)(1) of the Act, 21 U.S.C. section 360bbb-3(b)(1), unless the authorization is terminated or revoked.  Performed at [facility], [address] [place], [place] [address]      Labs: Basic Metabolic Panel: Recent Labs  Lab 12/21/20 1743 12/22/20 0436 12/23/20 0520  NA 126* 128* 131*  K 4.9 4.0 3.7  CL 92* 98 99  CO2 22 24 24   GLUCOSE 520* 336* 169*  BUN 12 15 13   CREATININE 0.76 0.51* 0.62  CALCIUM 9.3 8.7* 9.2  MG 1.6*  --   --    Liver Function Tests: Recent Labs  Lab 12/21/20 1743 12/22/20 0436  AST 27 16  ALT 16 14  ALKPHOS 127* 107  BILITOT 1.5* 0.6  PROT 7.8 6.1*  ALBUMIN 3.9 3.1*   CBC: Recent Labs  Lab 12/21/20 1743 12/22/20 0436  WBC 9.9 8.8  HGB 13.8 11.6*  HCT 41.9 34.8*  MCV 96.8 96.4  PLT 287 252   CBG: Recent Labs  Lab 12/22/20 1209 12/22/20 1639 12/22/20 2100 12/22/20 2352 12/23/20 0734  GLUCAP 373* 310* 231* 302* 152*   Hgb A1c Recent Labs    12/22/20 0736  HGBA1C 14.9*    Lipid Profile No results for input(s): CHOL, HDL, LDLCALC, TRIG, CHOLHDL, LDLDIRECT in the last 72 hours. Thyroid function studies No results for input(s): TSH, T4TOTAL, T3FREE, THYROIDAB in the last 72 hours.  Invalid input(s): FREET3 Urinalysis    Component Value Date/Time   COLORURINE YELLOW (A) 12/21/2020 1744   APPEARANCEUR CLEAR (A) 12/21/2020 1744   LABSPEC 1.035 (H) 12/21/2020 1744   PHURINE 5.0 12/21/2020 1744   GLUCOSEU >=500 (A) 12/21/2020 1744   HGBUR NEGATIVE 12/21/2020 1744   BILIRUBINUR NEGATIVE 12/21/2020 1744   KETONESUR 20 (A) 12/21/2020 1744   PROTEINUR  NEGATIVE 12/21/2020 1744   UROBILINOGEN 0.2 02/16/2014 1742   NITRITE NEGATIVE 12/21/2020 1744   LEUKOCYTESUR NEGATIVE 12/21/2020 1744    FURTHER DISCHARGE INSTRUCTIONS:   Get Medicines reviewed and adjusted: Please take all your medications with you for your next visit with your Primary MD   Laboratory/radiological data: Please request your Primary MD to go over all hospital tests and procedure/radiological results at the follow up, please ask your Primary MD to get all Hospital records sent to his/her office.   In some cases, they will be blood work, cultures and biopsy results pending at the time of your discharge. Please request that your primary care M.D. goes through all the records of your hospital data and follows up on these results.   Also Note the following: If you experience worsening of your admission symptoms, develop shortness of breath, life threatening emergency, suicidal or homicidal thoughts you must seek medical attention immediately by calling 911 or calling your MD immediately  if symptoms less severe.   You must read complete instructions/literature along with all the possible adverse reactions/side effects for all the Medicines you take and that have been prescribed to you. Take any new Medicines after you have completely understood and accpet all the possible adverse reactions/side  effects.    Do not drive when taking Pain medications or sleeping medications (Benzodaizepines)   Do not take more than prescribed Pain, Sleep and Anxiety Medications. It is not advisable to combine anxiety,sleep and pain medications without talking with your primary care practitioner   Special Instructions: If you have smoked or chewed Tobacco  in the last 2 yrs please stop smoking, stop any regular Alcohol  and or any Recreational drug use.   Wear Seat belts while driving.   Please note: You were cared for by a hospitalist during your hospital stay. Once you are discharged, your primary care physician will handle any further medical issues. Please note that NO REFILLS for any discharge medications will be authorized once you are discharged, as it is imperative that you return to your primary care physician (or establish a relationship with a primary care physician if you do not have one) for your post hospital discharge needs so that they can reassess your need for medications and monitor your lab values.  Time coordinating discharge: 40 minutes  SIGNED:  Pamella Pert, MD, PhD 12/23/2020, 8:57 AM

## 2020-12-23 NOTE — Plan of Care (Signed)
Discharge teaching completed with patient who is in stable condition. 

## 2020-12-23 NOTE — Plan of Care (Signed)
Continuing with plan of care. 

## 2020-12-23 NOTE — Discharge Instructions (Signed)
Carbohydrate Counting For People With Diabetes  Foods with carbohydrates make your blood glucose level go up. Learning how to count carbohydrates can help you control your blood glucose levels. First, identify the foods you eat that contain carbohydrates. Then, using the Foods with Carbohydrates chart, determine about how much carbohydrates are in your meals and snacks. Make sure you are eating foods with fiber, protein, and healthy fat along with your carbohydrate foods. Foods with Carbohydrates The following table shows carbohydrate foods that have about 15 grams of carbohydrate each. Using measuring cups, spoons, or a food scale when you first begin learning about carbohydrate counting can help you learn about the portion sizes you typically eat. The following foods have 15 grams carbohydrate each:  Grains . 1 slice bread (1 ounce)  . 1 small tortilla (6-inch size)  .  large bagel (1 ounce)  . 1/3 cup pasta or rice (cooked)  .  hamburger or hot dog bun ( ounce)  .  cup cooked cereal  .  to  cup ready-to-eat cereal  . 2 taco shells (5-inch size) Fruit . 1 small fresh fruit ( to 1 cup)  .  medium banana  . 17 small grapes (3 ounces)  . 1 cup melon or berries  .  cup canned or frozen fruit  . 2 tablespoons dried fruit (blueberries, cherries, cranberries, raisins)  .  cup unsweetened fruit juice  Starchy Vegetables .  cup cooked beans, peas, corn, potatoes/sweet potatoes  .  large baked potato (3 ounces)  . 1 cup acorn or butternut squash  Snack Foods . 3 to 6 crackers  . 8 potato chips or 13 tortilla chips ( ounce to 1 ounce)  . 3 cups popped popcorn  Dairy . 3/4 cup (6 ounces) nonfat plain yogurt, or yogurt with sugar-free sweetener  . 1 cup milk  . 1 cup plain rice, soy, coconut or flavored almond milk Sweets and Desserts .  cup ice cream or frozen yogurt  . 1 tablespoon jam, jelly, pancake syrup, table sugar, or honey  . 2 tablespoons light pancake syrup  . 1 inch  square of frosted cake or 2 inch square of unfrosted cake  . 2 small cookies (2/3 ounce each) or  large cookie  Sometimes you'll have to estimate carbohydrate amounts if you don't know the exact recipe. One cup of mixed foods like soups can have 1 to 2 carbohydrate servings, while some casseroles might have 2 or more servings of carbohydrate. Foods that have less than 20 calories in each serving can be counted as "free" foods. Count 1 cup raw vegetables, or  cup cooked non-starchy vegetables as "free" foods. If you eat 3 or more servings at one meal, then count them as 1 carbohydrate serving.  Foods without Carbohydrates  Not all foods contain carbohydrates. Meat, some dairy, fats, non-starchy vegetables, and many beverages don't contain carbohydrate. So when you count carbohydrates, you can generally exclude chicken, pork, beef, fish, seafood, eggs, tofu, cheese, butter, sour cream, avocado, nuts, seeds, olives, mayonnaise, water, black coffee, unsweetened tea, and zero-calorie drinks. Vegetables with no or low carbohydrate include green beans, cauliflower, tomatoes, and onions. How much carbohydrate should I eat at each meal?  Carbohydrate counting can help you plan your meals and manage your weight. Following are some starting points for carbohydrate intake at each meal. Work with your registered dietitian nutritionist to find the best range that works for your blood glucose and weight.   To Lose Weight To  Maintain Weight  Women 2 - 3 carb servings 3 - 4 carb servings  Men 3 - 4 carb servings 4 - 5 carb servings  Checking your blood glucose after meals will help you know if you need to adjust the timing, type, or number of carbohydrate servings in your meal plan. Achieve and keep a healthy body weight by balancing your food intake and physical activity.  Tips How should I plan my meals?  Plan for half the food on your plate to include non-starchy vegetables, like salad greens, broccoli, or  carrots. Try to eat 3 to 5 servings of non-starchy vegetables every day. Have a protein food at each meal. Protein foods include chicken, fish, meat, eggs, or beans (note that beans contain carbohydrate). These two food groups (non-starchy vegetables and proteins) are low in carbohydrate. If you fill up your plate with these foods, you will eat less carbohydrate but still fill up your stomach. Try to limit your carbohydrate portion to  of the plate.  What fats are healthiest to eat?  Diabetes increases risk for heart disease. To help protect your heart, eat more healthy fats, such as olive oil, nuts, and avocado. Eat less saturated fats like butter, cream, and high-fat meats, like bacon and sausage. Avoid trans fats, which are in all foods that list "partially hydrogenated oil" as an ingredient. What should I drink?  Choose drinks that are not sweetened with sugar. The healthiest choices are water, carbonated or seltzer waters, and tea and coffee without added sugars.  Sweet drinks will make your blood glucose go up very quickly. One serving of soda or energy drink is  cup. It is best to drink these beverages only if your blood glucose is low.  Artificially sweetened, or diet drinks, typically do not increase your blood glucose if they have zero calories in them. Read labels of beverages, as some diet drinks do have carbohydrate and will raise your blood glucose. Label Reading Tips Read Nutrition Facts labels to find out how many grams of carbohydrate are in a food you want to eat. Don't forget: sometimes serving sizes on the label aren't the same as how much food you are going to eat, so you may need to calculate how much carbohydrate is in the food you are serving yourself.   Carbohydrate Counting for People with Diabetes Sample 1-Day Menu  Breakfast  cup yogurt, low fat, low sugar (1 carbohydrate serving)   cup cereal, ready-to-eat, unsweetened (1 carbohydrate serving)  1 cup strawberries (1  carbohydrate serving)   cup almonds ( carbohydrate serving)  Lunch 1, 5 ounce can chunk light tuna  2 ounces cheese, low fat cheddar  6 whole wheat crackers (1 carbohydrate serving)  1 small apple (1 carbohydrate servings)   cup carrots ( carbohydrate serving)   cup snap peas  1 cup 1% milk (1 carbohydrate serving)   Evening Meal Stir fry made with: 3 ounces chicken  1 cup brown rice (3 carbohydrate servings)   cup broccoli ( carbohydrate serving)   cup green beans   cup onions  1 tablespoon olive oil  2 tablespoons teriyaki sauce ( carbohydrate serving)  Evening Snack 1 extra small banana (1 carbohydrate serving)  1 tablespoon peanut butter   Carbohydrate Counting for People with Diabetes Vegan Sample 1-Day Menu  Breakfast 1 cup cooked oatmeal (2 carbohydrate servings)   cup blueberries (1 carbohydrate serving)  2 tablespoons flaxseeds  1 cup soymilk fortified with calcium and vitamin D  1  cup coffee  Lunch 2 slices whole wheat bread (2 carbohydrate servings)   cup baked tofu   cup lettuce  2 slices tomato  2 slices avocado   cup baby carrots ( carbohydrate serving)  1 orange (1 carbohydrate serving)  1 cup soymilk fortified with calcium and vitamin D   Evening Meal Burrito made with: 1 6-inch corn tortilla (1 carbohydrate serving)  1 cup refried vegetarian beans (2 carbohydrate servings)   cup chopped tomatoes   cup lettuce   cup salsa  1/3 cup brown rice (1 carbohydrate serving)  1 tablespoon olive oil for rice   cup zucchini   Evening Snack 6 small whole grain crackers (1 carbohydrate serving)  2 apricots ( carbohydrate serving)   cup unsalted peanuts ( carbohydrate serving)    Carbohydrate Counting for People with Diabetes Vegetarian (Lacto-Ovo) Sample 1-Day Menu  Breakfast 1 cup cooked oatmeal (2 carbohydrate servings)   cup blueberries (1 carbohydrate serving)  2 tablespoons flaxseeds  1 egg  1 cup 1% milk (1 carbohydrate serving)  1  cup coffee  Lunch 2 slices whole wheat bread (2 carbohydrate servings)  2 ounces low-fat cheese   cup lettuce  2 slices tomato  2 slices avocado   cup baby carrots ( carbohydrate serving)  1 orange (1 carbohydrate serving)  1 cup unsweetened tea  Evening Meal Burrito made with: 1 6-inch corn tortilla (1 carbohydrate serving)   cup refried vegetarian beans (1 carbohydrate serving)   cup tomatoes   cup lettuce   cup salsa  1/3 cup brown rice (1 carbohydrate serving)  1 tablespoon olive oil for rice   cup zucchini  1 cup 1% milk (1 carbohydrate serving)  Evening Snack 6 small whole grain crackers (1 carbohydrate serving)  2 apricots ( carbohydrate serving)   cup unsalted peanuts ( carbohydrate serving)    Copyright 2020  [employer]. All rights reserved.  Using Nutrition Labels: Carbohydrate  . Serving Size  . Look at the serving size. All the information on the label is based on this portion. Jolyne Loa Per Container  . The number of servings contained in the package. . Guidelines for Carbohydrate  . Look at the total grams of carbohydrate in the serving size.  . 1 carbohydrate choice = 15 grams of carbohydrate. Range of Carbohydrate Grams Per Choice  Carbohydrate Grams/Choice Carbohydrate Choices  6-10   11-20 1  21-25 1  26-35 2  36-40 2  41-50 3  51-55 3  56-65 4  66-70 4  71-80 5    Copyright 2020  [employer]. All rights reserved.  Follow with Armando Gang, FNP in 5-7 days  Please get a complete blood count and chemistry panel checked by your Primary MD at your next visit, and again as instructed by your Primary MD. Please get your medications reviewed and adjusted by your Primary MD.  Please request your Primary MD to go over all Hospital Tests and Procedure/Radiological results at the follow up, please get all Hospital records sent to your Prim MD by signing hospital release before you go  home.  In some cases, there will be blood work, cultures and biopsy results pending at the time of your discharge. Please request that your primary care M.D. goes through all the records of your hospital data and follows up on these results.  If you had Pneumonia of Lung problems at the Hospital: Please get a 2 view Chest X ray  done in 6-8 weeks after hospital discharge or sooner if instructed by your Primary MD.  If you have Congestive Heart Failure: Please call your Cardiologist or Primary MD anytime you have any of the following symptoms:  1) 3 pound weight gain in 24 hours or 5 pounds in 1 week  2) shortness of breath, with or without a dry hacking cough  3) swelling in the hands, feet or stomach  4) if you have to sleep on extra pillows at night in order to breathe  Follow cardiac low salt diet and 1.5 lit/day fluid restriction.  If you have diabetes Accuchecks 4 times/day, Once in AM empty stomach and then before each meal. Log in all results and show them to your primary doctor at your next visit. If any glucose reading is under 80 or above 300 call your primary MD immediately.  If you have Seizure/Convulsions/Epilepsy: Please do not drive, operate heavy machinery, participate in activities at heights or participate in high speed sports until you have seen by Primary MD or a Neurologist and advised to do so again. Per [facility] statutes, patients with seizures are not allowed to drive until they have been seizure-free for six months.  Use caution when using heavy equipment or power tools. Avoid working on ladders or at heights. Take showers instead of baths. Ensure the water temperature is not too high on the home water heater. Do not go swimming alone. Do not lock yourself in a room alone (i.e. bathroom). When caring for infants or small children, sit down when holding, feeding, or changing them to minimize risk of injury to the child in the event you have a seizure. Maintain  good sleep hygiene. Avoid alcohol.   If you had Gastrointestinal Bleeding: Please ask your Primary MD to check a complete blood count within one week of discharge or at your next visit. Your endoscopic/colonoscopic biopsies that are pending at the time of discharge, will also need to followed by your Primary MD.  Get Medicines reviewed and adjusted. Please take all your medications with you for your next visit with your Primary MD  Please request your Primary MD to go over all hospital tests and procedure/radiological results at the follow up, please ask your Primary MD to get all Hospital records sent to his/her office.  If you experience worsening of your admission symptoms, develop shortness of breath, life threatening emergency, suicidal or homicidal thoughts you must seek medical attention immediately by calling 911 or calling your MD immediately  if symptoms less severe.  You must read complete instructions/literature along with all the possible adverse reactions/side effects for all the Medicines you take and that have been prescribed to you. Take any new Medicines after you have completely understood and accpet all the possible adverse reactions/side effects.   Do not drive or operate heavy machinery when taking Pain medications.   Do not take more than prescribed Pain, Sleep and Anxiety Medications  Special Instructions: If you have smoked or chewed Tobacco  in the last 2 yrs please stop smoking, stop any regular Alcohol  and or any Recreational drug use.  Wear Seat belts while driving.  Please note You were cared for by a hospitalist during your hospital stay. If you have any questions about your discharge medications or the care you received while you were in the hospital after you are discharged, you can call the unit and asked to speak with the hospitalist on call if the hospitalist that took care  of you is not available. Once you are discharged, your primary care physician will  handle any further medical issues. Please note that NO REFILLS for any discharge medications will be authorized once you are discharged, as it is imperative that you return to your primary care physician (or establish a relationship with a primary care physician if you do not have one) for your aftercare needs so that they can reassess your need for medications and monitor your lab values.  You can reach the hospitalist office at phone [phone] or fax [phone]   If you do not have a primary care physician, you can call [phone] for a physician referral.  Activity: As tolerated with Full fall precautions use walker/cane & assistance as needed    Diet: diabetic  Disposition Home

## 2020-12-25 LAB — HEMOGLOBIN A1C
Hgb A1c MFr Bld: 15.2 % — ABNORMAL HIGH (ref 4.8–5.6)
Mean Plasma Glucose: 390 mg/dL

## 2020-12-28 LAB — BLOOD GAS, VENOUS
Acid-base deficit: 0.7 mmol/L (ref 0.0–2.0)
Bicarbonate: 24.9 mmol/L (ref 20.0–28.0)
O2 Saturation: 34.5 %
Patient temperature: 37
pCO2, Ven: 44 mmHg (ref 44.0–60.0)
pH, Ven: 7.36 (ref 7.250–7.430)

## 2021-01-04 DIAGNOSIS — E119 Type 2 diabetes mellitus without complications: Secondary | ICD-10-CM | POA: Diagnosis not present

## 2021-01-04 DIAGNOSIS — R531 Weakness: Secondary | ICD-10-CM | POA: Diagnosis not present

## 2021-01-04 DIAGNOSIS — E7800 Pure hypercholesterolemia, unspecified: Secondary | ICD-10-CM | POA: Diagnosis not present

## 2021-01-04 DIAGNOSIS — K219 Gastro-esophageal reflux disease without esophagitis: Secondary | ICD-10-CM | POA: Diagnosis not present

## 2021-01-04 DIAGNOSIS — F32A Depression, unspecified: Secondary | ICD-10-CM | POA: Diagnosis not present

## 2021-03-19 DIAGNOSIS — R634 Abnormal weight loss: Secondary | ICD-10-CM | POA: Diagnosis not present

## 2021-03-19 DIAGNOSIS — E119 Type 2 diabetes mellitus without complications: Secondary | ICD-10-CM | POA: Diagnosis not present

## 2021-04-05 ENCOUNTER — Other Ambulatory Visit: Payer: Self-pay

## 2021-04-05 ENCOUNTER — Encounter: Payer: Self-pay | Admitting: Emergency Medicine

## 2021-04-05 ENCOUNTER — Emergency Department
Admission: EM | Admit: 2021-04-05 | Discharge: 2021-04-06 | Disposition: A | Discharge status: Home / Self Care | Payer: Medicaid Other | Attending: Emergency Medicine | Admitting: Emergency Medicine

## 2021-04-05 DIAGNOSIS — I2510 Atherosclerotic heart disease of native coronary artery without angina pectoris: Secondary | ICD-10-CM | POA: Diagnosis not present

## 2021-04-05 DIAGNOSIS — I10 Essential (primary) hypertension: Secondary | ICD-10-CM | POA: Insufficient documentation

## 2021-04-05 DIAGNOSIS — E119 Type 2 diabetes mellitus without complications: Secondary | ICD-10-CM | POA: Diagnosis not present

## 2021-04-05 DIAGNOSIS — Z7982 Long term (current) use of aspirin: Secondary | ICD-10-CM | POA: Diagnosis not present

## 2021-04-05 DIAGNOSIS — E1165 Type 2 diabetes mellitus with hyperglycemia: Secondary | ICD-10-CM | POA: Diagnosis not present

## 2021-04-05 DIAGNOSIS — Z794 Long term (current) use of insulin: Secondary | ICD-10-CM | POA: Diagnosis not present

## 2021-04-05 DIAGNOSIS — Z79899 Other long term (current) drug therapy: Secondary | ICD-10-CM | POA: Diagnosis not present

## 2021-04-05 DIAGNOSIS — Z7984 Long term (current) use of oral hypoglycemic drugs: Secondary | ICD-10-CM | POA: Insufficient documentation

## 2021-04-05 DIAGNOSIS — E7800 Pure hypercholesterolemia, unspecified: Secondary | ICD-10-CM | POA: Diagnosis not present

## 2021-04-05 DIAGNOSIS — E860 Dehydration: Secondary | ICD-10-CM | POA: Diagnosis not present

## 2021-04-05 DIAGNOSIS — Z7902 Long term (current) use of antithrombotics/antiplatelets: Secondary | ICD-10-CM | POA: Diagnosis not present

## 2021-04-05 DIAGNOSIS — E559 Vitamin D deficiency, unspecified: Secondary | ICD-10-CM | POA: Diagnosis not present

## 2021-04-05 DIAGNOSIS — R739 Hyperglycemia, unspecified: Secondary | ICD-10-CM

## 2021-04-05 DIAGNOSIS — R634 Abnormal weight loss: Secondary | ICD-10-CM | POA: Diagnosis not present

## 2021-04-05 DIAGNOSIS — D519 Vitamin B12 deficiency anemia, unspecified: Secondary | ICD-10-CM | POA: Diagnosis not present

## 2021-04-05 DIAGNOSIS — K7030 Alcoholic cirrhosis of liver without ascites: Secondary | ICD-10-CM | POA: Diagnosis not present

## 2021-04-05 DIAGNOSIS — E039 Hypothyroidism, unspecified: Secondary | ICD-10-CM | POA: Diagnosis not present

## 2021-04-05 DIAGNOSIS — F17210 Nicotine dependence, cigarettes, uncomplicated: Secondary | ICD-10-CM | POA: Diagnosis not present

## 2021-04-05 LAB — URINALYSIS, COMPLETE (UACMP) WITH MICROSCOPIC
Bacteria, UA: NONE SEEN
Bilirubin Urine: NEGATIVE
Glucose, UA: >=500 mg/dL — AB
Hgb urine dipstick: NEGATIVE
Ketones, ur: NEGATIVE mg/dL
Leukocytes,Ua: NEGATIVE
Nitrite: NEGATIVE
Protein, ur: NEGATIVE mg/dL
Specific Gravity, Urine: 1.033 — ABNORMAL HIGH (ref 1.005–1.030)
Squamous Epithelial / HPF: NONE SEEN (ref 0–5)
pH: 5 (ref 5.0–8.0)

## 2021-04-05 LAB — HEPATIC FUNCTION PANEL
ALT: 24 U/L (ref 0–44)
AST: 16 U/L (ref 15–41)
Albumin: 3.6 g/dL (ref 3.5–5.0)
Alkaline Phosphatase: 267 U/L — ABNORMAL HIGH (ref 38–126)
Bilirubin, Direct: <0.1 mg/dL (ref 0.0–0.2)
Total Bilirubin: 0.7 mg/dL (ref 0.3–1.2)
Total Protein: 7.5 g/dL (ref 6.5–8.1)

## 2021-04-05 LAB — BASIC METABOLIC PANEL
Anion gap: 9 (ref 5–15)
BUN: 27 mg/dL — ABNORMAL HIGH (ref 6–20)
CO2: 25 mmol/L (ref 22–32)
Calcium: 9.5 mg/dL (ref 8.9–10.3)
Chloride: 95 mmol/L — ABNORMAL LOW (ref 98–111)
Creatinine, Ser: 0.63 mg/dL (ref 0.61–1.24)
GFR, Estimated: >60 mL/min (ref >60)
Glucose, Bld: 468 mg/dL — ABNORMAL HIGH (ref 70–99)
Potassium: 4.5 mmol/L (ref 3.5–5.1)
Sodium: 129 mmol/L — ABNORMAL LOW (ref 135–145)

## 2021-04-05 LAB — CBC
HCT: 36 % — ABNORMAL LOW (ref 39.0–52.0)
Hemoglobin: 12 g/dL — ABNORMAL LOW (ref 13.0–17.0)
MCH: 31.2 pg (ref 26.0–34.0)
MCHC: 33.3 g/dL (ref 30.0–36.0)
MCV: 93.5 fL (ref 80.0–100.0)
Platelets: 395 10*3/uL (ref 150–400)
RBC: 3.85 MIL/uL — ABNORMAL LOW (ref 4.22–5.81)
RDW: 14.6 % (ref 11.5–15.5)
WBC: 9.5 10*3/uL (ref 4.0–10.5)
nRBC: 0 % (ref 0.0–0.2)

## 2021-04-05 LAB — PROTIME-INR
INR: 0.9 (ref 0.8–1.2)
Prothrombin Time: 12.6 seconds (ref 11.4–15.2)

## 2021-04-05 LAB — BLOOD GAS, VENOUS
Acid-Base Excess: 0.4 mmol/L (ref 0.0–2.0)
Bicarbonate: 26 mmol/L (ref 20.0–28.0)
O2 Saturation: 79.2 %
Patient temperature: 37
pCO2, Ven: 45 mmHg (ref 44.0–60.0)
pH, Ven: 7.37 (ref 7.250–7.430)
pO2, Ven: 45 mmHg (ref 32.0–45.0)

## 2021-04-05 LAB — CBG MONITORING, ED
Glucose-Capillary: 341 mg/dL — ABNORMAL HIGH (ref 70–99)
Glucose-Capillary: 323 mg/dL — ABNORMAL HIGH (ref 70–99)

## 2021-04-05 LAB — TROPONIN I (HIGH SENSITIVITY)
Troponin I (High Sensitivity): 4 ng/L (ref <18)
Troponin I (High Sensitivity): 4 ng/L (ref <18)

## 2021-04-05 MED ORDER — NICOTINE 21 MG/24HR TD PT24
21.0000 mg | MEDICATED_PATCH | Freq: Once | TRANSDERMAL | Status: DC
  Administered 2021-04-05: 21 mg via TRANSDERMAL
  Filled 2021-04-05: qty 1

## 2021-04-05 MED ORDER — SODIUM CHLORIDE 0.9 % IV BOLUS
1000.0000 mL | Freq: Once | INTRAVENOUS | Status: AC
  Administered 2021-04-05: 1000 mL via INTRAVENOUS

## 2021-04-05 MED ORDER — INSULIN ASPART 100 UNIT/ML IJ SOLN
5.0000 [IU] | Freq: Once | INTRAMUSCULAR | Status: AC
  Administered 2021-04-05: 5 [IU] via INTRAVENOUS
  Filled 2021-04-05: qty 1

## 2021-04-05 MED ORDER — INSULIN ASPART 100 UNIT/ML IJ SOLN
5.0000 [IU] | Freq: Once | INTRAMUSCULAR | Status: AC
  Administered 2021-04-06: 5 [IU] via INTRAVENOUS
  Filled 2021-04-05: qty 1

## 2021-04-05 NOTE — ED Notes (Signed)
bg [id number]

## 2021-04-05 NOTE — ED Notes (Signed)
Pt given half a can of ensure per EDP. Pt tolerating well

## 2021-04-05 NOTE — ED Provider Notes (Signed)
[facility] Emergency Department Provider Note  ____________________________________________   Event Date/Time   First MD Initiated Contact with Patient 04/05/21 2009     (approximate)  I have reviewed the triage vital signs and the nursing notes.   HISTORY  Chief Complaint Weakness and Dehydration   HPI Brandon [name] is a 55 y.o. male complains of feeling weak and dehydrated.  He says he is eating well and drinking well.  He says his sugar always runs high and he is not surprised when I tell him his sugar is 400+.  He was eating a hamburger and had to leave and is upset about that.  He denies any fever chills pain vomiting or other complaints.        Past Medical History:  Diagnosis Date   Alcohol dependency (HCC)    Hx ETOH withdrawal seizures before 2011   CAD (coronary artery disease) 06/13/2012   Calcification noted on CTA of chest in 2012 Wall motion abnormality on ECHO     Carotid artery disease (HCC) 2016   bilateral.  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    Depression with anxiety    Diabetes mellitus without complication (HCC)    GERD (gastroesophageal reflux disease)    Headache(784.0)    migraine   Heart disease 02/2015   PCI/DES placed to RCA: on chronic Plavix/ASA   Hyperlipidemia    Hypertension    Pancreatitis 2011....    CT findings in May 2011 with inflammation and pseudocyst.  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/2016   Renal artery stenosis, native, bilateral (HCC) 02/2015    Patient Active Problem List   Diagnosis Date Noted   Protein-calorie malnutrition, severe 12/23/2020   Hyperosmolar hyperglycemic state (HHS) (HCC) 12/22/2020   Hyperglycemia due to type 2 diabetes mellitus (HCC) 12/21/2020   Hyperglycemia due to diabetes mellitus (HCC) 12/21/2020   Empyema lung (HCC)    Epidural abscess    Diskitis 02/06/2020   Alcohol  withdrawal delirium (HCC) 02/06/2020   Epilepsia (HCC) 02/06/2020   Pleural effusion on right 02/06/2020   History of stroke 01/23/2017   Dry eyes    Sleep disturbance    Essential hypertension, benign    Upper GI bleed    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   Fatty liver    Tobacco abuse    Diabetes mellitus type 2 in nonobese (HCC)    History of CVA with residual deficit    Acute lower UTI    Tachycardia    Chronic alcoholic pancreatitis (HCC)    Hyponatremia 03/09/2016   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   Acute blood loss anemia 03/09/2016   Gastric varices    Alcohol abuse    Left-sided neglect    Severe anemia    UGIB (upper gastrointestinal bleed)    Pressure ulcer 03/06/2016   Acute encephalopathy    CVA (cerebral infarction) 03/05/2016   Carotid stenosis 04/06/2015   CAD in native artery 03/16/2015   Unstable angina pectoris (HCC) 03/15/2015   Neck pain 10/20/2013   Insomnia 12/29/2012   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 02/25/2011   Hepatic steatosis 02/25/2011    Past Surgical History:  Procedure Laterality Date   CARDIAC CATHETERIZATION N/A 03/15/2015   Procedure: Left Heart Cath;  Surgeon: Laurier Nancy, MD;  Location: [facility] INVASIVE CV LAB;  Service: Cardiovascular;  Laterality: N/A;   CARDIAC CATHETERIZATION N/A 03/16/2015   Procedure: Coronary Stent Intervention;   Surgeon: Alwyn Pea, MD;  Location: [facility] INVASIVE CV LAB;  Service: Cardiovascular;  Laterality: N/A;   CAROTID ANGIOGRAM N/A 06/15/2012   Procedure: CAROTID ANGIOGRAM;  Surgeon: Chuck Hint, MD;  Location: [facility] CATH LAB;  Service: Cardiovascular;  Laterality: N/A;   ESOPHAGOGASTRODUODENOSCOPY N/A 03/08/2016   Procedure: ESOPHAGOGASTRODUODENOSCOPY (EGD);  Surgeon: Ruffin Frederick, MD;  Location: [facility] ENDOSCOPY;  Service: Gastroenterology;  Laterality: N/A;   none     PERIPHERAL VASCULAR CATHETERIZATION Left 04/06/2015   Procedure: Carotid PTA/Stent Intervention;  Surgeon: Annice Needy, MD;  Location: [facility] INVASIVE CV LAB;  Service: Cardiovascular;  Laterality: Left;    Prior to Admission medications   Medication Sig Start Date End Date Taking? Authorizing Provider  albuterol (PROVENTIL) (2.5 MG/3ML) 0.083% nebulizer solution Take 3 mLs (2.5 mg total) by nebulization every 6 (six) hours as needed for wheezing or shortness of breath. 12/23/20   Gherghe, Daylene Katayama, MD  ASPIRIN LOW DOSE 81 MG EC tablet Take 1 tablet (81 mg total) by mouth daily. 12/23/20   Leatha Gilding, MD  busPIRone (BUSPAR) 10 MG tablet Take 1 tablet (10 mg total) by mouth 2 (two) times daily. 12/23/20   Leatha Gilding, MD  clopidogrel (PLAVIX) 75 MG tablet Take 1 tablet (75 mg total) by mouth daily. 12/23/20   Leatha Gilding, MD  cyclobenzaprine (FLEXERIL) 10 MG tablet Take 1 tablet (10 mg total) by mouth every 12 (twelve) hours. 12/23/20   Leatha Gilding, MD  famotidine (PEPCID) 20 MG tablet Take 1 tablet (20 mg total) by mouth 2 (two) times daily. 12/23/20   Gherghe, Daylene Katayama, MD  FEROSUL 325 (65 Fe) MG tablet Take 1 tablet (325 mg total) by mouth daily. 12/23/20   Leatha Gilding, MD  folic acid (FOLVITE) 1 MG tablet Take 1 tablet (1 mg total) by mouth daily. 12/23/20   Leatha Gilding, MD  insulin detemir (LEVEMIR) 100 UNIT/ML FlexPen Inject 12 Units into the skin at bedtime. 12/23/20   Leatha Gilding, MD   Insulin Pen Needle (PEN NEEDLES) 31G X 5 MM MISC 1 each by Does not apply route daily. 12/23/20   Leatha Gilding, MD  lisinopril (ZESTRIL) 10 MG tablet Take 1 tablet (10 mg total) by mouth daily. 12/23/20   Leatha Gilding, MD  metFORMIN (GLUCOPHAGE) 1000 MG tablet Take 1 tablet (1,000 mg total) by mouth 2 (two) times daily with a meal. 12/23/20   Gherghe, Daylene Katayama, MD  metoprolol tartrate (LOPRESSOR) 50 MG tablet Take 1 tablet (50 mg total) by mouth 2 (two) times daily. 12/23/20   Leatha Gilding, MD  Multiple Vitamin (MULTIVITAMIN WITH MINERALS) TABS tablet Take 1 tablet by mouth daily. 12/23/20   Leatha Gilding, MD  nicotine (NICODERM CQ - DOSED IN MG/24 HOURS) 14 mg/24hr patch Place 1 patch (14 mg total) onto the skin daily. 12/23/20   Gherghe, [employer]  M, MD  senna-docusate (SENOKOT-S) 8.6-50 MG tablet Take 1 tablet by mouth at bedtime. 02/19/20   [name], MD  simvastatin (ZOCOR) 20 MG tablet Take 1 tablet (20 mg total) by mouth daily at 6 PM. 12/23/20   Leatha Gilding, MD  traZODone (DESYREL) 150 MG tablet Take 1 tablet (150 mg total) by mouth at bedtime. 12/23/20   Leatha Gilding, MD    Allergies No known allergies  Family History  Problem Relation Age of Onset   Stroke Mother        deceased   Coronary artery disease Mother    Alcohol abuse Mother    Cancer Mother    Hypertension Father        alive   Alcohol abuse Father    Diabetes Father    Kidney disease Father    Stroke Maternal Grandmother     Social History Social History   Tobacco Use   Smoking status: Every Day    Packs/day: 1.00    Years: 30.00    Pack years: 30.00    Types: Cigarettes   Smokeless tobacco: Never   Tobacco comments:    smoking less  Substance Use Topics   Alcohol use: No    Alcohol/week: 6.0 standard drinks    Types: 6 Cans of beer per week    Comment: drinks 6-12 beer daily   Drug use: No    Review of Systems { Constitutional: No fever/chills Eyes: No visual changes. ENT: No sore  throat. Cardiovascular: Denies chest pain. Respiratory: Denies shortness of breath. Gastrointestinal: No abdominal pain.  No nausea, no vomiting.  No diarrhea.  No constipation. Genitourinary: Negative for dysuria. Musculoskeletal: Negative for back pain. Skin: Negative for rash. Neurological: Negative for headaches, focal weakness  ____________________________________________   PHYSICAL EXAM:  VITAL SIGNS: ED Triage Vitals [04/05/21 1741]  Enc Vitals Group     BP 114/82     Pulse Rate 70     Resp 20     Temp 97.8 F (36.6 C)     Temp Source Oral     SpO2 100 %     Weight 137 lb (62.1 kg)     Height 6' (1.829 m)     Head Circumference      Peak Flow      Pain Score 0     Pain Loc      Pain Edu?      Excl. in GC?    Constitutional: Alert and oriented.  Very thin and in no acute distress. Eyes: Conjunctivae are normal. PER EOMI. Head: Atraumatic. Nose: No congestion/rhinnorhea. Mouth/Throat: Mucous membranes are moist.  Oropharynx non-erythematous. Neck: No stridor.  Cardiovascular: Normal rate, regular rhythm. Grossly normal heart sounds.  Good peripheral circulation. Respiratory: Normal respiratory effort.  No retractions. Lungs CTAB. Gastrointestinal: Soft and nontender. No distention. No abdominal bruits.  Musculoskeletal: No lower extremity tenderness nor edema.  Neurologic:  Normal speech and language. No gross focal neurologic deficits are appreciated.  Skin:  Skin is warm, dry and intact. No rash noted.   ____________________________________________   LABS (all labs ordered are listed, but only abnormal results are displayed)  Labs Reviewed  BASIC METABOLIC PANEL - Abnormal; Notable for the following components:      Result Value   Sodium 129 (*)    Chloride 95 (*)    Glucose, Bld 468 (*)    BUN 27 (*)    All other components within normal limits  CBC - Abnormal;  Notable for the following components:   RBC 3.85 (*)    Hemoglobin 12.0 (*)    HCT  36.0 (*)    All other components within normal limits  URINALYSIS, COMPLETE (UACMP) WITH MICROSCOPIC - Abnormal; Notable for the following components:   Color, Urine YELLOW (*)    APPearance CLEAR (*)    Specific Gravity, Urine 1.033 (*)    Glucose, UA >=500 (*)    All other components within normal limits  HEPATIC FUNCTION PANEL - Abnormal; Notable for the following components:   Alkaline Phosphatase 267 (*)    All other components within normal limits  CBG MONITORING, ED - Abnormal; Notable for the following components:   Glucose-Capillary 341 (*)    All other components within normal limits  CBG MONITORING, ED - Abnormal; Notable for the following components:   Glucose-Capillary 323 (*)    All other components within normal limits  PROTIME-INR  BLOOD GAS, VENOUS  CBG MONITORING, ED  TROPONIN I (HIGH SENSITIVITY)  TROPONIN I (HIGH SENSITIVITY)   ____________________________________________  EKG   ____________________________________________  RADIOLOGY Jill PolingI, Meghan Tiemann,  Adriauna Campton F, personally viewed and evaluated these images (plain radiographs) as part of my medical decision making, as well as reviewing the written report by the radiologist.  ED MD interpretation:    Official radiology report(s): No results found.  ____________________________________________   PROCEDURES  Procedure(s) performed (including Critical Care): Critical care time 30 minutes.  This includes following the patient's blood glucose and reviewing his old records and medications.  Additionally I spoke to him 3 times to see how he is doing.  Procedures   ____________________________________________   INITIAL IMPRESSION / ASSESSMENT AND PLAN / ED COURSE   ----------------------------------------- 9:50 PM on 04/05/2021 ----------------------------------------- After first liter blood glucose is still 390+.  Blood pressure on his other arm is higher.  We will still give him a second liter of fluid  and some insulin we will see how he does.  His labs are not consistent with DKA at this point.  ----------------------------------------- 11:20 PM on 04/05/2021 -----------------------------------------  Patient's CBG has come down to just above 300.  We will give him another 5 of aspart.  Additionally blood pressure is left arm is significantly higher than blood pressure in his right arm.  Blood pressure is left arm is normal.  If we can get his blood pressure down below 300 and keep it there briefly I will let him go.     ____________________________________________   FINAL CLINICAL IMPRESSION(S) / ED DIAGNOSES  Final diagnoses:  Hyperglycemia     ED Discharge Orders     None        Note:  This document was prepared using Dragon voice recognition software and may include unintentional dictation errors.    Arnaldo NatalMalinda, Alayzia Pavlock F, MD 04/05/21 [phone]

## 2021-04-05 NOTE — ED Notes (Signed)
EDP notified of pt's difference of bp from L arm to R arm. Per EDP, give 2nd liter of fluid.

## 2021-04-05 NOTE — ED Notes (Signed)
bg 323
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
## 2021-04-05 NOTE — ED Notes (Signed)
bg 341 bp right arm 90/73 bp left arm 133/85

## 2021-04-05 NOTE — Discharge Instructions (Addendum)
Please be careful to follow your diet carefully.  Please increase your metformin to 2 of the 500 mg pills twice a day.  Please have your doctor check on you in the next few days and see how you are doing.  Make sure you are getting your fingersticks 4 times a day before meals and before bed.  Return here if the fingersticks are above 350 or below 120.

## 2021-04-05 NOTE — ED Notes (Signed)
Lab called to add on labs

## 2021-04-06 LAB — CBG MONITORING, ED
Glucose-Capillary: 198 mg/dL — ABNORMAL HIGH (ref 70–99)
Glucose-Capillary: 249 mg/dL — ABNORMAL HIGH (ref 70–99)

## 2021-04-26 DIAGNOSIS — E119 Type 2 diabetes mellitus without complications: Secondary | ICD-10-CM | POA: Diagnosis not present

## 2021-04-26 DIAGNOSIS — R634 Abnormal weight loss: Secondary | ICD-10-CM | POA: Diagnosis not present

## 2021-05-17 DIAGNOSIS — I10 Essential (primary) hypertension: Secondary | ICD-10-CM | POA: Diagnosis not present

## 2021-05-17 DIAGNOSIS — E118 Type 2 diabetes mellitus with unspecified complications: Secondary | ICD-10-CM | POA: Diagnosis not present

## 2021-05-17 DIAGNOSIS — Z681 Body mass index (BMI) 19 or less, adult: Secondary | ICD-10-CM | POA: Diagnosis not present

## 2021-05-17 DIAGNOSIS — R739 Hyperglycemia, unspecified: Secondary | ICD-10-CM | POA: Diagnosis not present

## 2021-05-22 DIAGNOSIS — J449 Chronic obstructive pulmonary disease, unspecified: Secondary | ICD-10-CM | POA: Diagnosis not present

## 2021-06-19 DIAGNOSIS — J449 Chronic obstructive pulmonary disease, unspecified: Secondary | ICD-10-CM | POA: Diagnosis not present

## 2021-06-21 DIAGNOSIS — J449 Chronic obstructive pulmonary disease, unspecified: Secondary | ICD-10-CM | POA: Diagnosis not present

## 2021-06-22 DIAGNOSIS — J449 Chronic obstructive pulmonary disease, unspecified: Secondary | ICD-10-CM | POA: Diagnosis not present

## 2021-06-24 DIAGNOSIS — J449 Chronic obstructive pulmonary disease, unspecified: Secondary | ICD-10-CM | POA: Diagnosis not present

## 2021-06-25 DIAGNOSIS — J449 Chronic obstructive pulmonary disease, unspecified: Secondary | ICD-10-CM | POA: Diagnosis not present

## 2021-06-26 DIAGNOSIS — J449 Chronic obstructive pulmonary disease, unspecified: Secondary | ICD-10-CM | POA: Diagnosis not present

## 2021-06-27 DIAGNOSIS — J449 Chronic obstructive pulmonary disease, unspecified: Secondary | ICD-10-CM | POA: Diagnosis not present

## 2021-06-28 DIAGNOSIS — I10 Essential (primary) hypertension: Secondary | ICD-10-CM | POA: Diagnosis not present

## 2021-06-28 DIAGNOSIS — E7800 Pure hypercholesterolemia, unspecified: Secondary | ICD-10-CM | POA: Diagnosis not present

## 2021-06-28 DIAGNOSIS — D649 Anemia, unspecified: Secondary | ICD-10-CM | POA: Diagnosis not present

## 2021-06-28 DIAGNOSIS — J449 Chronic obstructive pulmonary disease, unspecified: Secondary | ICD-10-CM | POA: Diagnosis not present

## 2021-06-28 DIAGNOSIS — E559 Vitamin D deficiency, unspecified: Secondary | ICD-10-CM | POA: Diagnosis not present

## 2021-06-28 DIAGNOSIS — E118 Type 2 diabetes mellitus with unspecified complications: Secondary | ICD-10-CM | POA: Diagnosis not present

## 2021-06-28 DIAGNOSIS — R634 Abnormal weight loss: Secondary | ICD-10-CM | POA: Diagnosis not present

## 2021-06-28 DIAGNOSIS — E039 Hypothyroidism, unspecified: Secondary | ICD-10-CM | POA: Diagnosis not present

## 2021-06-28 DIAGNOSIS — R739 Hyperglycemia, unspecified: Secondary | ICD-10-CM | POA: Diagnosis not present

## 2021-06-29 DIAGNOSIS — J449 Chronic obstructive pulmonary disease, unspecified: Secondary | ICD-10-CM | POA: Diagnosis not present

## 2021-06-30 DIAGNOSIS — J449 Chronic obstructive pulmonary disease, unspecified: Secondary | ICD-10-CM | POA: Diagnosis not present

## 2021-07-01 DIAGNOSIS — J449 Chronic obstructive pulmonary disease, unspecified: Secondary | ICD-10-CM | POA: Diagnosis not present

## 2021-07-02 DIAGNOSIS — J449 Chronic obstructive pulmonary disease, unspecified: Secondary | ICD-10-CM | POA: Diagnosis not present

## 2021-07-03 DIAGNOSIS — J449 Chronic obstructive pulmonary disease, unspecified: Secondary | ICD-10-CM | POA: Diagnosis not present

## 2021-07-04 DIAGNOSIS — J449 Chronic obstructive pulmonary disease, unspecified: Secondary | ICD-10-CM | POA: Diagnosis not present

## 2021-07-05 DIAGNOSIS — J449 Chronic obstructive pulmonary disease, unspecified: Secondary | ICD-10-CM | POA: Diagnosis not present

## 2021-07-06 DIAGNOSIS — J449 Chronic obstructive pulmonary disease, unspecified: Secondary | ICD-10-CM | POA: Diagnosis not present

## 2021-08-07 DIAGNOSIS — J449 Chronic obstructive pulmonary disease, unspecified: Secondary | ICD-10-CM | POA: Diagnosis not present

## 2021-08-08 DIAGNOSIS — J449 Chronic obstructive pulmonary disease, unspecified: Secondary | ICD-10-CM | POA: Diagnosis not present

## 2021-08-09 DIAGNOSIS — J449 Chronic obstructive pulmonary disease, unspecified: Secondary | ICD-10-CM | POA: Diagnosis not present

## 2021-08-10 DIAGNOSIS — J449 Chronic obstructive pulmonary disease, unspecified: Secondary | ICD-10-CM | POA: Diagnosis not present

## 2021-10-23 DIAGNOSIS — J449 Chronic obstructive pulmonary disease, unspecified: Secondary | ICD-10-CM | POA: Diagnosis not present

## 2021-10-24 DIAGNOSIS — E119 Type 2 diabetes mellitus without complications: Secondary | ICD-10-CM | POA: Diagnosis not present

## 2021-10-24 DIAGNOSIS — J449 Chronic obstructive pulmonary disease, unspecified: Secondary | ICD-10-CM | POA: Diagnosis not present

## 2021-10-24 DIAGNOSIS — R634 Abnormal weight loss: Secondary | ICD-10-CM | POA: Diagnosis not present

## 2021-10-25 DIAGNOSIS — J449 Chronic obstructive pulmonary disease, unspecified: Secondary | ICD-10-CM | POA: Diagnosis not present

## 2021-10-26 ENCOUNTER — Telehealth: Payer: Self-pay | Admitting: Family Medicine

## 2021-10-26 DIAGNOSIS — J449 Chronic obstructive pulmonary disease, unspecified: Secondary | ICD-10-CM | POA: Diagnosis not present

## 2021-10-26 NOTE — Telephone Encounter (Signed)
.. °  Medicaid Managed Care   Unsuccessful Outreach Note  10/26/2021 Name: Brandon [name] MRN: [id number] DOB: 11/01/1965  Referred by: Armando Gang, FNP Reason for referral : High Risk Managed Medicaid (I called the patient's sister today to get him scheduled with the MM Team. I left my name and number on her VM.)   An unsuccessful telephone outreach was attempted today. The patient was referred to the case management team for assistance with care management and care coordination.   Follow Up Plan: The care management team will reach out to the patient again over the next 7 days.   Weston Settle Care Guide, High Risk Medicaid Managed Care Embedded Care Coordination [facility]   [facility]

## 2021-10-29 DIAGNOSIS — J449 Chronic obstructive pulmonary disease, unspecified: Secondary | ICD-10-CM | POA: Diagnosis not present

## 2021-10-30 DIAGNOSIS — J449 Chronic obstructive pulmonary disease, unspecified: Secondary | ICD-10-CM | POA: Diagnosis not present

## 2021-10-31 ENCOUNTER — Other Ambulatory Visit: Payer: Self-pay

## 2021-10-31 DIAGNOSIS — J449 Chronic obstructive pulmonary disease, unspecified: Secondary | ICD-10-CM | POA: Diagnosis not present

## 2021-10-31 NOTE — Patient Outreach (Signed)
Medicaid Managed Care Social Work Note  10/31/2021 Name:  Brandon [name] MRN:  [id number] DOB:  [birth year]  Brandon [name] is an 56 y.o. year old male who is a primary patient of Armando Gang, FNP.  The [facility] Managed Care Coordination team was consulted for assistance with:  Community Resources   Brandon [name] was given information about Medicaid Managed Care Coordination team services today. [name] Patient and Primary Caregiver agreed to services and verbal consent obtained.  Engaged with patient  for by telephone forinitial visit in response to referral for case management and/or care coordination services.   Assessments/Interventions:  Review of past medical history, allergies, medications, health status, including review of consultants reports, laboratory and other test data, was performed as part of comprehensive evaluation and provision of chronic care management services.  SDOH: (Social Determinant of Health) assessments and interventions performed: BSW received a referral for case follow up from [facility]. BSW contacted patient's sister Brandon [name]. She states that patient is able to move around with a walker. He does have PCS that comes in from Tuesday to Friday, but she feels as though he needs 24 hour care. She is gone Fridays and does not return until Tuesday. Patient does receive foodstamps and gets about 560 in disability. Patient pays rent at 300 per month and medications are about 100 each month. BSW spoke with patient and he states everything is good, but he does need a new phone. Brandon [name] states someone from [facility] was out on yesterday and stated that would be assisting with the phone. Patient stated that he would like a RNCM to contact him on a regular basis. BSW will follow up with patient about phone. No other resources are needed.   Advanced Directives Status:  Not addressed in this encounter.  Care Plan                 Allergies  Allergen Reactions   No Known Allergies      Medications Reviewed Today     Reviewed by Judeth Horn, CPhT (Pharmacy Technician) on 12/21/20 at 1822  Med List Status: Complete   Medication Order Taking? Sig Documenting Provider Last Dose Status Informant  albuterol (PROVENTIL) (2.5 MG/3ML) 0.083% nebulizer solution [id number] Yes Take 3 mLs (2.5 mg total) by nebulization every 6 (six) hours as needed for wheezing or shortness of breath. [name], MD Past Month Unknown time Active   Amino Acids-Protein Hydrolys (FEEDING SUPPLEMENT, PRO-STAT SUGAR FREE 64,) LIQD [id number]  Take 30 mLs by mouth 2 (two) times daily. [name], MD  Active   ASPIRIN LOW DOSE 81 MG EC tablet [id number] Yes Take 81 mg by mouth daily. [provider] Past Month Unknown time Active   busPIRone (BUSPAR) 10 MG tablet [id number] Yes Take 10 mg by mouth 2 (two) times daily. [provider] Past Month Unknown time Active Multiple Informants  clopidogrel (PLAVIX) 75 MG tablet [id number] Yes Take 75 mg by mouth daily.  [provider] Past Month Unknown time Active Multiple Informants  cyclobenzaprine (FLEXERIL) 10 MG tablet [id number] Yes Take 1 tablet by mouth every 12 (twelve) hours. [provider] Past Month Unknown time Active   famotidine (PEPCID) 20 MG tablet [id number] Yes Take 20 mg by mouth 2 (two) times daily.  [provider] Past Month Unknown time Active Multiple Informants  feeding supplement, GLUCERNA SHAKE, (GLUCERNA SHAKE) LIQD [id number]  Take 237 mLs by mouth 3 (three) times daily between meals. [name], MD  Active   FEROSUL 325 (65 Fe) MG tablet [id number] Yes Take 325 mg by mouth daily. [provider] Past Month Unknown time Active   folic acid (FOLVITE) 1 MG tablet [id number] Yes Take 1 tablet (1 mg total) by mouth daily. [name], MD Past Month Unknown time Active   insulin aspart (NOVOLOG) 100 UNIT/ML injection [id number] No Inject 3 Units into the skin 3 (three) times daily with meals.  Patient not  taking: Reported on 12/21/2020   [name], MD Not Taking Unknown time Active   insulin aspart (NOVOLOG) 100 UNIT/ML injection [id number] No Inject 0-15 Units into the skin 3 (three) times daily with meals. Insulin sliding scale: Blood sugar  120-150   3units                       151-200   4units                       201-250   7units                       251- 300  11units                       301-350   15uints                       351-400   20units                       >400         call MD immediately  Patient not taking: Reported on 12/21/2020   [name], MD Not Taking Unknown time Active   insulin detemir (LEVEMIR) 100 UNIT/ML injection [id number] No Inject 0.1 mLs (10 Units total) into the skin 2 (two) times daily.  Patient not taking: Reported on 12/21/2020   [name], MD Not Taking Unknown time Active   lisinopril (ZESTRIL) 10 MG tablet [id number] Yes Take 10 mg by mouth daily. [provider] Past Month Unknown time Active   metFORMIN (GLUCOPHAGE) 1000 MG tablet [id number] Yes Take 1 tablet (1,000 mg total) by mouth 2 (two) times daily with a meal. Angiulli, Mcarthur Rossetti, PA-C Past Month Unknown time Active Multiple Informants  metoprolol tartrate (LOPRESSOR) 50 MG tablet [id number] Yes Take 1 tablet (50 mg total) by mouth 2 (two) times daily. [name], MD Past Month Unknown time Active   Multiple Vitamin (MULTIVITAMIN WITH MINERALS) TABS tablet [id number] Yes Take 1 tablet by mouth daily. [name], MD Past Month Unknown time Active   ranitidine (ZANTAC) 150 MG capsule [id number] Yes Take 150 mg by mouth every evening. [provider] Past Month Unknown time Active   senna-docusate (SENOKOT-S) 8.6-50 MG tablet [id number] Yes Take 1 tablet by mouth at bedtime. [name], MD Past Month Unknown time Active   simvastatin (ZOCOR) 20 MG tablet [id number] Yes Take 20 mg by mouth daily at 6 PM.  [provider] Past Month Unknown time Active Multiple Informants  traZODone (DESYREL)  150 MG tablet [id number] Yes Take 1 tablet (150 mg total) by mouth at bedtime. [name], MD Past Month Unknown time Active             Patient Active Problem List   Diagnosis Date Noted   Protein-calorie malnutrition, severe 12/23/2020   Hyperosmolar hyperglycemic state (HHS) (HCC) 12/22/2020   Hyperglycemia due  to type 2 diabetes mellitus (HCC) 12/21/2020   Hyperglycemia due to diabetes mellitus (HCC) 12/21/2020   Empyema lung (HCC)    Epidural abscess    Diskitis 02/06/2020   Alcohol withdrawal delirium (HCC) 02/06/2020   Epilepsia (HCC) 02/06/2020   Pleural effusion on right 02/06/2020   History of stroke 01/23/2017   Dry eyes    Sleep disturbance    Essential hypertension, benign    Upper GI bleed    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   Fatty liver    Tobacco abuse    Diabetes mellitus type 2 in nonobese [facility])    History of CVA with residual deficit    Acute lower UTI    Tachycardia    Chronic alcoholic pancreatitis (HCC)    Hyponatremia 03/09/2016   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   Acute blood loss anemia 03/09/2016   Gastric varices    Alcohol abuse    Left-sided neglect    Severe anemia    UGIB (upper gastrointestinal bleed)    Pressure ulcer 03/06/2016   Acute encephalopathy    CVA (cerebral infarction) 03/05/2016   Carotid stenosis 04/06/2015   CAD in native artery 03/16/2015   Unstable angina pectoris (HCC) 03/15/2015   Neck pain 10/20/2013   Insomnia 12/29/2012   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 02/25/2011   Hepatic steatosis 02/25/2011    Conditions to be addressed/monitored per PCP order:  Tobacco Use  There are no care plans that you recently modified to display for this patient.   Follow up:  Patient agrees to Care Plan and Follow-up.  Plan: The Managed Medicaid care management team will reach out to the patient again over the next 30 days.  Date/time of next scheduled Social Work care management/care coordination outreach:  11/28/21  Gus Puma, Kenard Gower, [facility]   [facility]  High Risk Managed Medicaid Team  [phone]

## 2021-10-31 NOTE — Patient Instructions (Signed)
Visit Information  Mr. Meidinger was given information about Medicaid Managed Care team care coordination services as a part of their [facility] Community Plan Medicaid benefit. [name] verbally consented to engagement with the [facility] Managed Care team.   If you are experiencing a medical emergency, please call 911 or report to your local emergency department or urgent care.   If you have a non-emergency medical problem during routine business hours, please contact your provider's office and ask to speak with a nurse.   For questions related to your [facility], please call: [phone] or visit the homepage here: [email or web address]  If you would like to schedule transportation through your [facility], please call the following number at least 2 days in advance of your appointment: [phone].   Call the Behavioral Health Crisis Line at [phone], at any time, 24 hours a day, 7 days a week. If you are in danger or need immediate medical attention call 911.  If you would like help to quit smoking, call 1-800-QUIT-NOW (716-073-6895) OR Espaol: 1-855-Djelo-Ya (2-025-427-0623) o para ms informacin haga clic aqu or Text READY to 762-831 to register via text  Mr. Copher - following are the goals we discussed in your visit today:   Goals Addressed   None     Social Worker will follow up with patient regarding phone.   Gus Puma, BSW, [facility]   [place]  High Risk Managed Medicaid Team  [phone]   Following is a copy of your plan of care:  There are no care plans that you recently modified to display for this patient.

## 2021-10-31 NOTE — Patient Outreach (Signed)
Care Coordination  10/31/2021  MARIAN GRANDT 1966/01/26 [id number]  BSW started telephone call after verifying patient and doing enrollment questions, patient's sister [name] stated it was not a good time because she had her grandchild and to call back in 30 minutes.  Gus Puma, BSW, [facility]   [place]  High Risk Managed Medicaid Team  [phone]

## 2021-11-01 DIAGNOSIS — J449 Chronic obstructive pulmonary disease, unspecified: Secondary | ICD-10-CM | POA: Diagnosis not present

## 2021-11-02 DIAGNOSIS — J449 Chronic obstructive pulmonary disease, unspecified: Secondary | ICD-10-CM | POA: Diagnosis not present

## 2021-11-05 DIAGNOSIS — E039 Hypothyroidism, unspecified: Secondary | ICD-10-CM | POA: Diagnosis not present

## 2021-11-05 DIAGNOSIS — D509 Iron deficiency anemia, unspecified: Secondary | ICD-10-CM | POA: Diagnosis not present

## 2021-11-05 DIAGNOSIS — R5383 Other fatigue: Secondary | ICD-10-CM | POA: Diagnosis not present

## 2021-11-05 DIAGNOSIS — E785 Hyperlipidemia, unspecified: Secondary | ICD-10-CM | POA: Diagnosis not present

## 2021-11-05 DIAGNOSIS — D519 Vitamin B12 deficiency anemia, unspecified: Secondary | ICD-10-CM | POA: Diagnosis not present

## 2021-11-05 DIAGNOSIS — E559 Vitamin D deficiency, unspecified: Secondary | ICD-10-CM | POA: Diagnosis not present

## 2021-11-05 DIAGNOSIS — J449 Chronic obstructive pulmonary disease, unspecified: Secondary | ICD-10-CM | POA: Diagnosis not present

## 2021-11-05 DIAGNOSIS — I10 Essential (primary) hypertension: Secondary | ICD-10-CM | POA: Diagnosis not present

## 2021-11-05 DIAGNOSIS — E1165 Type 2 diabetes mellitus with hyperglycemia: Secondary | ICD-10-CM | POA: Diagnosis not present

## 2021-11-05 DIAGNOSIS — R739 Hyperglycemia, unspecified: Secondary | ICD-10-CM | POA: Diagnosis not present

## 2021-11-06 DIAGNOSIS — J449 Chronic obstructive pulmonary disease, unspecified: Secondary | ICD-10-CM | POA: Diagnosis not present

## 2021-11-07 DIAGNOSIS — J449 Chronic obstructive pulmonary disease, unspecified: Secondary | ICD-10-CM | POA: Diagnosis not present

## 2021-11-08 DIAGNOSIS — J449 Chronic obstructive pulmonary disease, unspecified: Secondary | ICD-10-CM | POA: Diagnosis not present

## 2021-11-09 DIAGNOSIS — J449 Chronic obstructive pulmonary disease, unspecified: Secondary | ICD-10-CM | POA: Diagnosis not present

## 2021-11-12 DIAGNOSIS — J449 Chronic obstructive pulmonary disease, unspecified: Secondary | ICD-10-CM | POA: Diagnosis not present

## 2021-11-13 DIAGNOSIS — J449 Chronic obstructive pulmonary disease, unspecified: Secondary | ICD-10-CM | POA: Diagnosis not present

## 2021-11-14 DIAGNOSIS — J449 Chronic obstructive pulmonary disease, unspecified: Secondary | ICD-10-CM | POA: Diagnosis not present

## 2021-11-15 ENCOUNTER — Other Ambulatory Visit: Payer: Self-pay | Admitting: Obstetrics and Gynecology

## 2021-11-15 ENCOUNTER — Other Ambulatory Visit: Payer: Self-pay

## 2021-11-15 DIAGNOSIS — J449 Chronic obstructive pulmonary disease, unspecified: Secondary | ICD-10-CM | POA: Diagnosis not present

## 2021-11-15 NOTE — Patient Instructions (Signed)
Visit Information  Brandon [name] / patient's sister/DPR was given information about Medicaid Managed Care team care coordination services as a part of their [facility] Community Plan Medicaid benefit. Brandon [name] /patient's sister/DPR verbally consented to engagement with the [facility] Managed Care team.   If you are experiencing a medical emergency, please call 911 or report to your local emergency department or urgent care.   If you have a non-emergency medical problem during routine business hours, please contact your provider's office and ask to speak with a nurse.   For questions related to your [facility], please call: [phone] or visit the homepage here: [email or web address]  If you would like to schedule transportation through your [facility], please call the following number at least 2 days in advance of your appointment: [phone].  Rides for urgent appointments can also be made after hours by calling Member Services.  Call the Behavioral Health Crisis Line at [phone], at any time, 24 hours a day, 7 days a week. If you are in danger or need immediate medical attention call 911.  If you would like help to quit smoking, call 1-800-QUIT-NOW (681-667-4327) OR Espaol: 1-855-Djelo-Ya (8-638-177-1165) o para ms informacin haga clic aqu or Text READY to 790-383 to register via text  Brandon [name] /patient's sister/DPR- following are the goals we discussed in your visit today:   Goals Addressed    Note:   Timeframe:  Long-Range Goal Priority:  High Start Date:        11/15/21                     Expected End Date:     ongoing                  Follow Up Date 12/13/21    - schedule appointment for flu shot - schedule appointment for vaccines needed due to my age or health - schedule recommended health tests - schedule and keep appointment for annual check-up     Why is this important?   Screening tests can find diseases early when they are easier to treat.  Your doctor or nurse will talk with you about which tests are important for you.  Getting shots for common diseases like the flu and shingles will help prevent them.     Patient / Patient's sister/DPR declined copy of care plan today. The Managed Medicaid care management team will reach out to the patient / patient's sister/DPR again over the next 30 days.  The  [employer] (DPR) has been provided with contact information for the Managed Medicaid care management team and has been advised to call with any health related questions or concerns.   Following is a copy of your plan of care:  Care Plan : RN Care Manager Plan of Care  Updates made by Danie Chandler, RN since 11/15/2021 12:00 AM     Problem: Chronic Disease Management and Care Coordiantion Needs related to chronic health conditions.   Priority: High     Long-Range Goal: Self-Management Plan Developed   Start Date: 11/15/2021  Expected End Date: 02/12/2022  This Visit's Progress: Not on track  Priority: High  Note:   Current Barriers:  Knowledge Deficits related to plan of care for management of chronic health conditions  Care Coordination needs related to personal care services Chronic Disease Management support and education needs related to chronic health conditions   RNCM Clinical Goal(s):  Patient  will verbalize understanding of plan for management of chronic health conditions verbalize basic understanding of  chronic health conditions  disease process and self health management plan take all medications exactly as prescribed and will call provider for medication related questions  demonstrate understanding of rationale for each prescribed medication attend all scheduled medical appointments continue to work with RN Care Manager to address care management and care coordination needs related to  chronic health  conditions as evidenced by adherence to CM Team Scheduled appointments work with pharmacist to address medications related tochronic health conditions  as evidenced by review or EMR and patient or pharmacist report through collaboration with Medical illustrator, provider, and care team.   Interventions: Inter-disciplinary care team collaboration (see longitudinal plan of care) Evaluation of current treatment plan related to  self management and patient's adherence to plan as established by provider   (Status:  New goal.)  Long Term Goal Evaluation of current treatment plan related to  chronic health conditions , self-management and patient's adherence to plan as established by provider. Discussed plans with patient for ongoing care management follow up and provided patient with direct contact information for care management team Evaluation of current treatment plan related to chronic health conditions  and patient's adherence to plan as established by provider Advised patient 's sister to contact agency to increase PCS Reviewed medications with patient's sister  Collaborated with pharmacy regarding medications Reviewed scheduled/upcoming provider appointments  Advised patient, providing education and rationale, to check cbg and record, calling provider for findings outside established parameters  Pharmacy referral for medication review Discussed plans with patient for ongoing care management follow up and provided patient with direct contact information for care management team  Patient Goals/Self-Care Activities: Take all medications as prescribed Attend all scheduled provider appointments Call pharmacy for medication refills 3-7 days in advance of running out of medications Perform all self care activities independently  Call provider office for new concerns or questions   Follow Up Plan:  The patient's sister  has been provided with contact information for the care management team and has  been advised to call with any health related questions or concerns.

## 2021-11-15 NOTE — Patient Outreach (Signed)
Medicaid Managed Care   Nurse Care Manager Note  11/15/2021 Name:  Brandon SmilingKeith W Shrider MRN:  [id number] DOB:  06/19/1966  Brandon [name] is an 56 y.o. year old male who is a primary patient of Armando GangLindley, Cheryl P, FNP.  [facility] consulted for assistance with:    Chronic healthcare needs, CAD, HTN, GERD, DM, HLD, h/o stroke  Brandon [name] / patient's sister/DPR was given information about Medicaid Managed Care Coordination team services today. Brandon [name] Designated [employer] (DPR) agreed to services and verbal consent obtained.  Engaged with patient/patient's sister/DPR by telephone for initial visit in response to provider referral for case management and/or care coordination services.   Assessments/Interventions:  Review of past medical history, allergies, medications, health status, including review of consultants reports, laboratory and other test data, was performed as part of comprehensive evaluation and provision of chronic care management services.  SDOH (Social Determinants of Health) assessments and interventions performed: SDOH Interventions    Flowsheet Row Most Recent Value  SDOH Interventions   Intimate Partner Violence Interventions Intervention Not Indicated  Physical Activity Interventions Other (Comments), Intervention Not Indicated  [patient not physically able to engage in moderate to strenuous exercise]      Care Plan  Allergies  Allergen Reactions   No Known Allergies     Medications Reviewed Today     Reviewed by Danie Chandlerraft, Karizma Cheek G, RN (Registered Nurse) on 11/15/21 at 1413  Med List Status: <None>   Medication Order Taking? Sig Documenting Provider Last Dose Status Informant  albuterol (PROVENTIL) (2.5 MG/3ML) 0.083% nebulizer solution [id number] No Take 3 mLs (2.5 mg total) by nebulization every 6 (six) hours as needed for wheezing or shortness of breath.  Patient not taking: Reported on 11/15/2021   Leatha GildingGherghe, Costin  M, MD Not Taking Active   ASPIRIN LOW DOSE 81 MG EC tablet [id number] Yes Take 1 tablet (81 mg total) by mouth daily. Leatha GildingGherghe, Costin M, MD Taking Active   busPIRone (BUSPAR) 10 MG tablet [id number] Yes Take 1 tablet (10 mg total) by mouth 2 (two) times daily. Leatha GildingGherghe, Costin M, MD Taking Active   clopidogrel (PLAVIX) 75 MG tablet [id number] No Take 1 tablet (75 mg total) by mouth daily.  Patient not taking: Reported on 11/15/2021   Leatha GildingGherghe, Costin M, MD Not Taking Active   cyclobenzaprine (FLEXERIL) 10 MG tablet [id number] Yes Take 1 tablet (10 mg total) by mouth every 12 (twelve) hours. Leatha GildingGherghe, Costin M, MD Taking Active   famotidine (PEPCID) 20 MG tablet [id number] Yes Take 1 tablet (20 mg total) by mouth 2 (two) times daily. Leatha GildingGherghe, Costin M, MD Taking Active   FEROSUL 325 (65 Fe) MG tablet [id number] No Take 1 tablet (325 mg total) by mouth daily.  Patient not taking: Reported on 11/15/2021   Leatha GildingGherghe, Costin M, MD Not Taking Active   folic acid (FOLVITE) 1 MG tablet [id number] Yes Take 1 tablet (1 mg total) by mouth daily. Leatha GildingGherghe, Costin M, MD Taking Active   insulin aspart (NOVOLOG) 100 UNIT/ML injection [id number] Yes Inject into the skin 3 (three) times daily before meals. Sliding scale [provider]  Active Family Member  insulin detemir (LEVEMIR) 100 UNIT/ML FlexPen [id number] Yes Inject 12 Units into the skin at bedtime. Leatha GildingGherghe, Costin M, MD Taking Active   Insulin Pen Needle (PEN NEEDLES) 31G X 5 MM MISC [id number] Yes 1 each by Does not apply route daily. Leatha GildingGherghe, Costin M, MD Taking Active   lisinopril (ZESTRIL)  10 MG tablet [id number] Yes Take 1 tablet (10 mg total) by mouth daily. Leatha Gilding, MD Taking Active   metFORMIN (GLUCOPHAGE) 1000 MG tablet [id number] Yes Take 1 tablet (1,000 mg total) by mouth 2 (two) times daily with a meal. Leatha Gilding, MD Taking Active   metoprolol tartrate (LOPRESSOR) 50 MG tablet [id number] Yes Take 1 tablet (50 mg total) by mouth 2 (two)  times daily. Leatha Gilding, MD Taking Active   Multiple Vitamin (MULTIVITAMIN WITH MINERALS) TABS tablet [id number] Yes Take 1 tablet by mouth daily. Leatha Gilding, MD Taking Active   nicotine (NICODERM CQ - DOSED IN MG/24 HOURS) 14 mg/24hr patch [id number] Yes Place 1 patch (14 mg total) onto the skin daily.  Patient taking differently: Place 14 mg onto the skin daily. Uses sometimes   Leatha Gilding, MD Taking Active Self  senna-docusate (SENOKOT-S) 8.6-50 MG tablet [id number] No Take 1 tablet by mouth at bedtime.  Patient not taking: Reported on 11/15/2021   [name], MD Not Taking Active   simvastatin (ZOCOR) 20 MG tablet [id number] Yes Take 1 tablet (20 mg total) by mouth daily at 6 PM. Leatha Gilding, MD Taking Active   traZODone (DESYREL) 150 MG tablet [id number] Yes Take 1 tablet (150 mg total) by mouth at bedtime. Leatha Gilding, MD Taking Active            Patient Active Problem List   Diagnosis Date Noted   Protein-calorie malnutrition, severe 12/23/2020   Hyperosmolar hyperglycemic state (HHS) (HCC) 12/22/2020   Hyperglycemia due to type 2 diabetes mellitus (HCC) 12/21/2020   Hyperglycemia due to diabetes mellitus (HCC) 12/21/2020   Empyema lung (HCC)    Epidural abscess    Diskitis 02/06/2020   Alcohol withdrawal delirium (HCC) 02/06/2020   Epilepsia (HCC) 02/06/2020   Pleural effusion on right 02/06/2020   History of stroke 01/23/2017   Dry eyes    Sleep disturbance    Essential hypertension, benign    Upper GI bleed    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   Fatty liver    Tobacco abuse    Diabetes mellitus type 2 in nonobese (HCC)    History of CVA with residual deficit    Acute lower UTI    Tachycardia    Chronic alcoholic pancreatitis (HCC)    Hyponatremia 03/09/2016   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   Acute blood loss anemia 03/09/2016   Gastric varices    Alcohol abuse    Left-sided  neglect    Severe anemia    UGIB (upper gastrointestinal bleed)    Pressure ulcer 03/06/2016   Acute encephalopathy    CVA (cerebral infarction) 03/05/2016   Carotid stenosis 04/06/2015   CAD in native artery 03/16/2015   Unstable angina pectoris (HCC) 03/15/2015   Neck pain 10/20/2013   Insomnia 12/29/2012   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 02/25/2011   Hepatic steatosis 02/25/2011   Conditions to be addressed/monitored  per PCP order:  Chronic healthcare needs, CAD, HTN, GERD, DM, HLD, h/o stroke, pancreatitis  Care Plan : RN Care Manager Plan of Care  Updates made by Danie Chandler, RN since 11/15/2021 12:00 AM     Problem: Chronic Disease Management and Care Coordiantion Needs related to chronic health conditions.   Priority: High     Long-Range Goal: Self-Management Plan Developed   Start Date: 11/15/2021  Expected End Date: 02/12/2022  This Visit's Progress: Not on track  Priority: High  Note:   Current Barriers:  Knowledge Deficits related to plan of care for management of chronic health conditions  Care Coordination needs related to personal care services Chronic Disease Management support and education needs related to chronic health conditions   RNCM Clinical Goal(s):  Patient will verbalize understanding of plan for management of chronic health conditions verbalize basic understanding of  chronic health conditions  disease  process and self health management plan take all medications exactly as prescribed and will call provider for medication related questions  demonstrate understanding of rationale for each prescribed medication attend all scheduled medical appointments continue to work with RN Care Manager to address care management and care coordination needs related to  chronic health conditions as evidenced by adherence to CM Team Scheduled appointments work with pharmacist to address medications related tochronic health conditions  as evidenced by review or EMR and patient or pharmacist report through collaboration with Medical illustrator, provider, and care team.   Interventions: Inter-disciplinary care team collaboration (see longitudinal plan of care) Evaluation of current treatment plan related to  self management and patient's adherence to plan as established by provider   (Status:  New goal.)  Long Term Goal Evaluation of current treatment plan related to  chronic health conditions , self-management and patient's adherence to plan as established by provider. Discussed plans with patient for ongoing care management follow up and provided patient with direct contact information for care management team Evaluation of current treatment plan related to chronic health conditions  and patient's adherence to plan as established by provider Advised patient 's sister to contact agency to increase PCS Reviewed medications with patient's sister  Collaborated with pharmacy regarding medications Reviewed scheduled/upcoming provider appointments  Advised patient, providing education and rationale, to check cbg and record, calling provider for findings outside established parameters  Pharmacy referral for medication review Discussed plans with patient for ongoing care management follow up and provided patient with direct contact information for care management team  Patient Goals/Self-Care Activities: Take all  medications as prescribed Attend all scheduled provider appointments Call pharmacy for medication refills 3-7 days in advance of running out of medications Perform all self care activities independently  Call provider office for new concerns or questions   Follow Up Plan:  The patient's sister  has been provided with contact information for the care management team and has been advised to call with any health related questions or concerns.   Long-Range Goal: Establish Plan of Care for Chronic Disease Management Needs   Start Date: 11/15/2021  Expected End Date: 02/12/2022  Priority: High  Note:   Timeframe:  Long-Range Goal Priority:  High Start Date:        11/15/21                     Expected End Date:     ongoing                  Follow Up  Date 12/13/21    - schedule appointment for flu shot - schedule appointment for vaccines needed due to my age or health - schedule recommended health tests - schedule and keep appointment for annual check-up    Why is this important?   Screening tests can find diseases early when they are easier to treat.  Your doctor or nurse will talk with you about which tests are important for you.  Getting shots for common diseases like the flu and shingles will help prevent them.     Follow Up:  Patient /Patient's sister/DPR agrees to Care Plan and Follow-up.  Plan: The Managed Medicaid care management team will reach out to the patient /patient's sister/DPR again over the next 30 days. and The  [employer] (DPR) has been provided with contact information for the Managed Medicaid care management team and has been advised to call with any health related questions or concerns.  Date/time of next scheduled RN care management/care coordination outreach:  12/13/21 at 1030.

## 2021-11-16 DIAGNOSIS — J449 Chronic obstructive pulmonary disease, unspecified: Secondary | ICD-10-CM | POA: Diagnosis not present

## 2021-11-19 DIAGNOSIS — J449 Chronic obstructive pulmonary disease, unspecified: Secondary | ICD-10-CM | POA: Diagnosis not present

## 2021-11-20 DIAGNOSIS — J449 Chronic obstructive pulmonary disease, unspecified: Secondary | ICD-10-CM | POA: Diagnosis not present

## 2021-11-21 DIAGNOSIS — J449 Chronic obstructive pulmonary disease, unspecified: Secondary | ICD-10-CM | POA: Diagnosis not present

## 2021-11-22 DIAGNOSIS — J449 Chronic obstructive pulmonary disease, unspecified: Secondary | ICD-10-CM | POA: Diagnosis not present

## 2021-11-23 DIAGNOSIS — J449 Chronic obstructive pulmonary disease, unspecified: Secondary | ICD-10-CM | POA: Diagnosis not present

## 2021-11-26 DIAGNOSIS — J449 Chronic obstructive pulmonary disease, unspecified: Secondary | ICD-10-CM | POA: Diagnosis not present

## 2021-11-27 DIAGNOSIS — J449 Chronic obstructive pulmonary disease, unspecified: Secondary | ICD-10-CM | POA: Diagnosis not present

## 2021-11-28 ENCOUNTER — Other Ambulatory Visit: Payer: Self-pay

## 2021-11-28 DIAGNOSIS — J449 Chronic obstructive pulmonary disease, unspecified: Secondary | ICD-10-CM | POA: Diagnosis not present

## 2021-11-28 NOTE — Patient Instructions (Signed)
Visit Information ? ?Mr. Maret was given information about Medicaid Managed Care team care coordination services as a part of their [facility] Community Plan Medicaid benefit. [name] verbally consented to engagement with the [facility] Managed Care team.  ? ?If you are experiencing a medical emergency, please call 911 or report to your local emergency department or urgent care.  ? ?If you have a non-emergency medical problem during routine business hours, please contact your provider's office and ask to speak with a nurse.  ? ?For questions related to your [facility], please call: [phone] or visit the homepage here: [email or web address] ? ?If you would like to schedule transportation through your [facility], please call the following number at least 2 days in advance of your appointment: [phone]. ? Rides for urgent appointments can also be made after hours by calling Member Services. ? ?Call the Behavioral Health Crisis Line at [phone], at any time, 24 hours a day, 7 days a week. If you are in danger or need immediate medical attention call 911. ? ?If you would like help to quit smoking, call 1-800-QUIT-NOW (567-787-9893) OR Espa?ol: 1-855-D?jelo-Ya 807-774-2820) o para m?s informaci?n haga clic aqu? or Text READY to 200-400 to register via text ? ?Mr. Roker - following are the goals we discussed in your visit today:  ? Goals Addressed   ?None ?  ? ? ? ? ?The  Patient                                              has been provided with contact information for the Managed Medicaid care management team and has been advised to call with any health related questions or concerns.  ? ?Gus Puma, BSW, MHA ?[facility]  ?High Risk Managed Medicaid Team  ?[phone]  ? ?Following is a copy of your plan of care:  ?There are no care plans that you  recently modified to display for this patient. ?  ?

## 2021-11-28 NOTE — Patient Outreach (Signed)
?Medicaid Managed Care ?Social Work Note ? ?11/28/2021 ?Name:  Brandon SmilingKeith W Imran MRN:  [id number] DOB:  [birth year] ? ?Brandon [name] is an 56 y.o. year old male who is a primary patient of Armando GangLindley, Cheryl P, FNP.  [facility] was consulted for assistance with:   phone  ? ?Brandon [name] was given information about Medicaid Managed Care Coordination team services today. Brandon [name] Patient agreed to services and verbal consent obtained. ? ?Engaged with patient  for by telephone forfollow up visit in response to referral for case management and/or care coordination services.  ? ?Assessments/Interventions:  Review of past medical history, allergies, medications, health status, including review of consultants reports, laboratory and other test data, was performed as part of comprehensive evaluation and provision of chronic care management services. ? ?SDOH: (Social Determinant of Health) assessments and interventions performed: ?BSW contacted patients sister to follow up with [employer]. His sister Selena BattenKim answer the phone and quickly tried to get BSW off the phone, BSW was able to ask if [facility] [facility], [facility] stated [facility], BSW asked for his new phone number and she stated she did not know. Patient was standing there and provided Selena BattenKim his new number, [name] informed BSW to call him on his phone because she was busy. BSW contacted patient at his new number [phone] and got the voicemail.  ? ?Advanced Directives Status:  Not addressed in this encounter. ? ?Care Plan ?                ?Allergies  ?Allergen Reactions  ? No Known Allergies   ? ? ?Medications Reviewed Today   ? ? Reviewed by Danie Chandlerraft, Terri G, RN (Registered Nurse) on 11/15/21 at 1413  Med List Status: <None>  ? ?Medication Order Taking? Sig Documenting Provider Last Dose Status Informant  ?albuterol (PROVENTIL) (2.5 MG/3ML) 0.083% nebulizer solution [id number] No Take 3 mLs (2.5 mg total) by nebulization every 6  (six) hours as needed for wheezing or shortness of breath.  ?Patient not taking: Reported on 11/15/2021  ? Leatha GildingGherghe, Costin M, MD Not Taking Active   ?ASPIRIN LOW DOSE 81 MG EC tablet [id number] Yes Take 1 tablet (81 mg total) by mouth daily. Leatha GildingGherghe, Costin M, MD Taking Active   ?busPIRone (BUSPAR) 10 MG tablet [id number] Yes Take 1 tablet (10 mg total) by mouth 2 (two) times daily. Leatha GildingGherghe, Costin M, MD Taking Active   ?clopidogrel (PLAVIX) 75 MG tablet [id number] No Take 1 tablet (75 mg total) by mouth daily.  ?Patient not taking: Reported on 11/15/2021  ? Leatha GildingGherghe, Costin M, MD Not Taking Active   ?cyclobenzaprine (FLEXERIL) 10 MG tablet [id number] Yes Take 1 tablet (10 mg total) by mouth every 12 (twelve) hours. Leatha GildingGherghe, Costin M, MD Taking Active   ?famotidine (PEPCID) 20 MG tablet [id number] Yes Take 1 tablet (20 mg total) by mouth 2 (two) times daily. Leatha GildingGherghe, Costin M, MD Taking Active   ?FEROSUL 325 (65 Fe) MG tablet [id number] No Take 1 tablet (325 mg total) by mouth daily.  ?Patient not taking: Reported on 11/15/2021  ? Leatha GildingGherghe, Costin M, MD Not Taking Active   ?folic acid (FOLVITE) 1 MG tablet [id number] Yes Take 1 tablet (1 mg total) by mouth daily. Leatha GildingGherghe, Costin M, MD Taking Active   ?insulin aspart (NOVOLOG) 100 UNIT/ML injection [id number] Yes Inject into the skin 3 (three) times daily before meals. Sliding scale [provider]  Active Family Member  ?insulin detemir (LEVEMIR)  100 UNIT/ML FlexPen [id number] Yes Inject 12 Units into the skin at bedtime. Leatha Gilding, MD Taking Active   ?Insulin Pen Needle (PEN NEEDLES) 31G X 5 MM MISC [id number] Yes 1 each by Does not apply route daily. Leatha Gilding, MD Taking Active   ?lisinopril (ZESTRIL) 10 MG tablet [id number] Yes Take 1 tablet (10 mg total) by mouth daily. Leatha Gilding, MD Taking Active   ?metFORMIN (GLUCOPHAGE) 1000 MG tablet [id number] Yes Take 1 tablet (1,000 mg total) by mouth 2 (two) times daily with a meal. Leatha Gilding, MD  Taking Active   ?metoprolol tartrate (LOPRESSOR) 50 MG tablet [id number] Yes Take 1 tablet (50 mg total) by mouth 2 (two) times daily. Leatha Gilding, MD Taking Active   ?Multiple Vitamin (MULTIVITAMIN WITH MINERALS) TABS tablet [id number] Yes Take 1 tablet by mouth daily. Leatha Gilding, MD Taking Active   ?nicotine (NICODERM CQ - DOSED IN MG/24 HOURS) 14 mg/24hr patch [id number] Yes Place 1 patch (14 mg total) onto the skin daily.  ?Patient taking differently: Place 14 mg onto the skin daily. Uses sometimes  ? Leatha Gilding, MD Taking Active Self  ?senna-docusate (SENOKOT-S) 8.6-50 MG tablet [id number] No Take 1 tablet by mouth at bedtime.  ?Patient not taking: Reported on 11/15/2021  ? [name], MD Not Taking Active   ?simvastatin (ZOCOR) 20 MG tablet [id number] Yes Take 1 tablet (20 mg total) by mouth daily at 6 PM. Leatha Gilding, MD Taking Active   ?traZODone (DESYREL) 150 MG tablet [id number] Yes Take 1 tablet (150 mg total) by mouth at bedtime. Leatha Gilding, MD Taking Active   ? ?  ?  ? ?  ? ? ?Patient Active Problem List  ? Diagnosis Date Noted  ? Protein-calorie malnutrition, severe 12/23/2020  ? Hyperosmolar hyperglycemic state (HHS) (HCC) 12/22/2020  ? Hyperglycemia due to type 2 diabetes mellitus (HCC) 12/21/2020  ? Hyperglycemia due to diabetes mellitus (HCC) 12/21/2020  ? Empyema lung (HCC)   ? Epidural abscess   ? Diskitis 02/06/2020  ? Alcohol withdrawal delirium (HCC) 02/06/2020  ? Epilepsia (HCC) 02/06/2020  ? Pleural effusion on right 02/06/2020  ? History of stroke 01/23/2017  ? Dry eyes   ? Sleep disturbance   ? Essential hypertension, benign   ? Upper GI bleed   ? 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  ? Fatty liver   ? Tobacco abuse   ? Diabetes mellitus type 2 in nonobese [facility])   ? History of CVA with residual deficit   ? Acute lower UTI   ? Tachycardia   ? Chronic alcoholic pancreatitis (HCC)   ? Hyponatremia 03/09/2016  ? 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  ? Acute blood loss anemia 03/09/2016  ? Gastric varices   ? Alcohol abuse   ? Left-sided neglect   ? Severe anemia   ? UGIB (upper gastrointestinal bleed)   ? Pressure ulcer 03/06/2016  ? Acute encephalopathy   ? CVA (cerebral infarction) 03/05/2016  ? Carotid stenosis 04/06/2015  ? CAD in native artery 03/16/2015  ? Unstable angina pectoris (HCC) 03/15/2015  ? Neck pain 10/20/2013  ? Insomnia 12/29/2012  ? 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 Occlusion and stenosis of carotid artery with cerebral infarction 12/11/2012  ? Unspecified cerebral artery occlusion with cerebral infarction 12/11/2012  ? Fatigue 11/26/2012  ? Hallux valgus 06/25/2012  ? Preventative health care 06/25/2012  ? Alcohol Dependence 06/18/2012  ?  Smoking 06/16/2012  ? Bilateral extracranial carotid artery stenosis   ? Coronary Artery Disease 06/13/2012  ? Ischemic Stroke 06/13/2012  ? Hyperlipidemia   ? Hypertension 02/25/2011  ? GERD (gastroesophageal reflux disease) 02/25/2011  ? Chronic Pancreatitis. 02/25/2011  ? Hepatic steatosis 02/25/2011  ? ? ?Conditions to be addressed/monitored per PCP order:   cellphone ? ?There are no care plans that you recently modified to display for this patient. ? ? ?Follow up:  Patient agrees to Care Plan and Follow-up. ? ?Plan: The Managed Medicaid care management team will reach out to the patient again over the next 30 days. ? ? ?Gus Puma, BSW, [place] ?[facility]  ?High Risk Managed Medicaid Team  ?[phone]  ?

## 2021-11-29 DIAGNOSIS — J449 Chronic obstructive pulmonary disease, unspecified: Secondary | ICD-10-CM | POA: Diagnosis not present

## 2021-11-30 DIAGNOSIS — J449 Chronic obstructive pulmonary disease, unspecified: Secondary | ICD-10-CM | POA: Diagnosis not present

## 2021-12-13 ENCOUNTER — Other Ambulatory Visit: Payer: Self-pay | Admitting: Obstetrics and Gynecology

## 2021-12-13 NOTE — Patient Outreach (Signed)
Care Coordination ? ?12/13/2021 ? [name] ?March 21, 1966 ?[id number] ? ? ?Medicaid Managed Care  ? ?Unsuccessful Outreach Note ? ?12/13/2021 ?Name: PIKE SCANTLEBURY MRN: [id number] DOB: [birth year] ? ?Referred by: Armando Gang, FNP ?Reason for referral : High Risk Managed Medicaid (Unsuccessful telephone outreach) ? ? ?An unsuccessful telephone outreach was attempted today. The patient was referred to the case management team for assistance with care management and care coordination.  ? ?Follow Up Plan: The care management team will reach out to the patient again over the next 7-14 business days.  ? ?Kathi Der RN, BSN ?[place]  [facility] ?Care Management Coordinator - Managed Medicaid High Risk ?[phone] ?  ? ? ?

## 2021-12-13 NOTE — Patient Instructions (Signed)
Visit Information ? ?Mr. Mitzi Davenport  - as a part of your Medicaid benefit, you are eligible for care management and care coordination services at no cost or copay. I was unable to reach you by phone today but would be happy to help you with your health related needs. Please feel free to call me at [phone] ? ?A member of the Managed Medicaid care management team will reach out to you again over the next 7-14 business  days.  ? ?Aida Raider RN, BSN ?[facility] ?Care Management Coordinator - Managed Medicaid High Risk ?[phone] ?  ?

## 2021-12-14 DIAGNOSIS — J449 Chronic obstructive pulmonary disease, unspecified: Secondary | ICD-10-CM | POA: Diagnosis not present

## 2021-12-17 DIAGNOSIS — J449 Chronic obstructive pulmonary disease, unspecified: Secondary | ICD-10-CM | POA: Diagnosis not present

## 2021-12-18 ENCOUNTER — Telehealth: Payer: Self-pay | Admitting: Pharmacist

## 2021-12-18 DIAGNOSIS — J449 Chronic obstructive pulmonary disease, unspecified: Secondary | ICD-10-CM | POA: Diagnosis not present

## 2021-12-18 NOTE — Patient Outreach (Signed)
?  Chronic Care Management  ? ?Note ? ?12/18/2021 ?Name: Brandon [name] MRN: [id number] DOB: [birth year] ? ? ?Attempted to contact patient for pharmacy referral. Initially, I did get him on the phone, but we were unable to hear each other. He said he was going to walk to another room where he could hear me, and the call ended. Called back and again and went to voicemail.  Left HIPAA compliant message for patient to return my call at their convenience.   ? ?Catie Darnelle Maffucci, PharmD, BCACP ?[place] ?[phone] ? ?

## 2021-12-19 DIAGNOSIS — J449 Chronic obstructive pulmonary disease, unspecified: Secondary | ICD-10-CM | POA: Diagnosis not present

## 2021-12-20 DIAGNOSIS — J449 Chronic obstructive pulmonary disease, unspecified: Secondary | ICD-10-CM | POA: Diagnosis not present

## 2021-12-26 DIAGNOSIS — J449 Chronic obstructive pulmonary disease, unspecified: Secondary | ICD-10-CM | POA: Diagnosis not present

## 2021-12-27 DIAGNOSIS — J449 Chronic obstructive pulmonary disease, unspecified: Secondary | ICD-10-CM | POA: Diagnosis not present

## 2021-12-28 DIAGNOSIS — J449 Chronic obstructive pulmonary disease, unspecified: Secondary | ICD-10-CM | POA: Diagnosis not present

## 2021-12-31 ENCOUNTER — Other Ambulatory Visit: Payer: Self-pay | Admitting: Obstetrics and Gynecology

## 2021-12-31 DIAGNOSIS — J449 Chronic obstructive pulmonary disease, unspecified: Secondary | ICD-10-CM | POA: Diagnosis not present

## 2021-12-31 NOTE — Patient Outreach (Signed)
?  Care Coordination ? ?12/31/2021 ? [name] ?08-29-1966 ?[id number] ? ? ?Medicaid Managed Care  ? ?Unsuccessful Outreach Note ? ?12/31/2021 ?Name: Brandon [name] MRN: [id number] DOB: August 28, 1966 ? ?Referred by: Armando Gang, FNP ?Reason for referral : High Risk Managed Medicaid (Unsuccessful telephone outreach) ?A second unsuccessful telephone outreach was attempted today. The patient was referred to the case management team for assistance with care management and care coordination.  ? ?Follow Up Plan: The care management team will reach out to the patient again over the next 30 business days.  ? ?Kathi Der RN, BSN ?[place]  [facility] ?Care Management Coordinator - Managed Medicaid High Risk ?[phone] ?  ? ? ?

## 2021-12-31 NOTE — Patient Instructions (Signed)
Visit Information ? ?[name]  - as a part of your Medicaid benefit, you are eligible for care management and care coordination services at no cost or copay. I was unable to reach you by phone today but would be happy to help you with your health related needs. Please feel free to call me at [phone] ? ?A member of the Managed Medicaid care management team will reach out to you again over the next 30 business  days.  ? ?Kathi Der RN, BSN ?[place]  [facility] ?Care Management Coordinator - Managed Medicaid High Risk ?[phone] ?  ?

## 2022-01-01 DIAGNOSIS — J449 Chronic obstructive pulmonary disease, unspecified: Secondary | ICD-10-CM | POA: Diagnosis not present

## 2022-01-02 DIAGNOSIS — J449 Chronic obstructive pulmonary disease, unspecified: Secondary | ICD-10-CM | POA: Diagnosis not present

## 2022-01-03 DIAGNOSIS — J449 Chronic obstructive pulmonary disease, unspecified: Secondary | ICD-10-CM | POA: Diagnosis not present

## 2022-01-04 DIAGNOSIS — J449 Chronic obstructive pulmonary disease, unspecified: Secondary | ICD-10-CM | POA: Diagnosis not present

## 2022-01-07 ENCOUNTER — Inpatient Hospital Stay: Payer: Medicaid Other

## 2022-01-07 ENCOUNTER — Emergency Department: Payer: Medicaid Other

## 2022-01-07 ENCOUNTER — Other Ambulatory Visit: Payer: Self-pay

## 2022-01-07 ENCOUNTER — Inpatient Hospital Stay
Admission: EM | Admit: 2022-01-07 | Discharge: 2022-01-09 | DRG: 637 | Disposition: A | Discharge status: Home / Self Care | Payer: Medicaid Other | Attending: Internal Medicine | Admitting: Internal Medicine

## 2022-01-07 ENCOUNTER — Encounter: Payer: Self-pay | Admitting: Emergency Medicine

## 2022-01-07 DIAGNOSIS — F1010 Alcohol abuse, uncomplicated: Secondary | ICD-10-CM | POA: Diagnosis present

## 2022-01-07 DIAGNOSIS — E785 Hyperlipidemia, unspecified: Secondary | ICD-10-CM | POA: Diagnosis present

## 2022-01-07 DIAGNOSIS — I10 Essential (primary) hypertension: Secondary | ICD-10-CM | POA: Diagnosis present

## 2022-01-07 DIAGNOSIS — Y92019 Unspecified place in single-family (private) house as the place of occurrence of the external cause: Secondary | ICD-10-CM

## 2022-01-07 DIAGNOSIS — Z681 Body mass index (BMI) 19 or less, adult: Secondary | ICD-10-CM

## 2022-01-07 DIAGNOSIS — E1010 Type 1 diabetes mellitus with ketoacidosis without coma: Secondary | ICD-10-CM | POA: Diagnosis not present

## 2022-01-07 DIAGNOSIS — Z7984 Long term (current) use of oral hypoglycemic drugs: Secondary | ICD-10-CM

## 2022-01-07 DIAGNOSIS — M25551 Pain in right hip: Secondary | ICD-10-CM | POA: Diagnosis present

## 2022-01-07 DIAGNOSIS — I2510 Atherosclerotic heart disease of native coronary artery without angina pectoris: Secondary | ICD-10-CM | POA: Diagnosis not present

## 2022-01-07 DIAGNOSIS — I6523 Occlusion and stenosis of bilateral carotid arteries: Secondary | ICD-10-CM | POA: Diagnosis present

## 2022-01-07 DIAGNOSIS — E43 Unspecified severe protein-calorie malnutrition: Secondary | ICD-10-CM | POA: Diagnosis present

## 2022-01-07 DIAGNOSIS — I639 Cerebral infarction, unspecified: Secondary | ICD-10-CM | POA: Diagnosis present

## 2022-01-07 DIAGNOSIS — I69351 Hemiplegia and hemiparesis following cerebral infarction affecting right dominant side: Secondary | ICD-10-CM | POA: Diagnosis not present

## 2022-01-07 DIAGNOSIS — I959 Hypotension, unspecified: Secondary | ICD-10-CM | POA: Diagnosis present

## 2022-01-07 DIAGNOSIS — Z823 Family history of stroke: Secondary | ICD-10-CM | POA: Diagnosis not present

## 2022-01-07 DIAGNOSIS — L89152 Pressure ulcer of sacral region, stage 2: Secondary | ICD-10-CM | POA: Diagnosis present

## 2022-01-07 DIAGNOSIS — G40909 Epilepsy, unspecified, not intractable, without status epilepticus: Secondary | ICD-10-CM | POA: Diagnosis present

## 2022-01-07 DIAGNOSIS — K219 Gastro-esophageal reflux disease without esophagitis: Secondary | ICD-10-CM | POA: Diagnosis present

## 2022-01-07 DIAGNOSIS — F1020 Alcohol dependence, uncomplicated: Secondary | ICD-10-CM | POA: Diagnosis present

## 2022-01-07 DIAGNOSIS — M4802 Spinal stenosis, cervical region: Secondary | ICD-10-CM | POA: Diagnosis not present

## 2022-01-07 DIAGNOSIS — M25572 Pain in left ankle and joints of left foot: Secondary | ICD-10-CM | POA: Diagnosis not present

## 2022-01-07 DIAGNOSIS — F32A Depression, unspecified: Secondary | ICD-10-CM | POA: Diagnosis present

## 2022-01-07 DIAGNOSIS — I701 Atherosclerosis of renal artery: Secondary | ICD-10-CM | POA: Diagnosis present

## 2022-01-07 DIAGNOSIS — Z7982 Long term (current) use of aspirin: Secondary | ICD-10-CM

## 2022-01-07 DIAGNOSIS — R296 Repeated falls: Secondary | ICD-10-CM | POA: Diagnosis present

## 2022-01-07 DIAGNOSIS — IMO0001 Reserved for inherently not codable concepts without codable children: Secondary | ICD-10-CM | POA: Diagnosis present

## 2022-01-07 DIAGNOSIS — I6521 Occlusion and stenosis of right carotid artery: Secondary | ICD-10-CM | POA: Diagnosis present

## 2022-01-07 DIAGNOSIS — Z833 Family history of diabetes mellitus: Secondary | ICD-10-CM | POA: Diagnosis not present

## 2022-01-07 DIAGNOSIS — Z7902 Long term (current) use of antithrombotics/antiplatelets: Secondary | ICD-10-CM

## 2022-01-07 DIAGNOSIS — E1165 Type 2 diabetes mellitus with hyperglycemia: Secondary | ICD-10-CM | POA: Diagnosis not present

## 2022-01-07 DIAGNOSIS — E860 Dehydration: Secondary | ICD-10-CM | POA: Diagnosis present

## 2022-01-07 DIAGNOSIS — R64 Cachexia: Secondary | ICD-10-CM | POA: Diagnosis present

## 2022-01-07 DIAGNOSIS — F419 Anxiety disorder, unspecified: Secondary | ICD-10-CM | POA: Diagnosis present

## 2022-01-07 DIAGNOSIS — F17200 Nicotine dependence, unspecified, uncomplicated: Secondary | ICD-10-CM | POA: Diagnosis present

## 2022-01-07 DIAGNOSIS — R739 Hyperglycemia, unspecified: Secondary | ICD-10-CM | POA: Diagnosis not present

## 2022-01-07 DIAGNOSIS — M6282 Rhabdomyolysis: Secondary | ICD-10-CM | POA: Diagnosis not present

## 2022-01-07 DIAGNOSIS — Z79899 Other long term (current) drug therapy: Secondary | ICD-10-CM

## 2022-01-07 DIAGNOSIS — Z8249 Family history of ischemic heart disease and other diseases of the circulatory system: Secondary | ICD-10-CM

## 2022-01-07 DIAGNOSIS — W19XXXA Unspecified fall, initial encounter: Secondary | ICD-10-CM | POA: Diagnosis present

## 2022-01-07 DIAGNOSIS — Z955 Presence of coronary angioplasty implant and graft: Secondary | ICD-10-CM

## 2022-01-07 DIAGNOSIS — Z841 Family history of disorders of kidney and ureter: Secondary | ICD-10-CM

## 2022-01-07 DIAGNOSIS — Z794 Long term (current) use of insulin: Secondary | ICD-10-CM

## 2022-01-07 DIAGNOSIS — L8990 Pressure ulcer of unspecified site, unspecified stage: Secondary | ICD-10-CM | POA: Insufficient documentation

## 2022-01-07 DIAGNOSIS — E1110 Type 2 diabetes mellitus with ketoacidosis without coma: Secondary | ICD-10-CM | POA: Diagnosis not present

## 2022-01-07 DIAGNOSIS — J449 Chronic obstructive pulmonary disease, unspecified: Secondary | ICD-10-CM | POA: Diagnosis not present

## 2022-01-07 DIAGNOSIS — R42 Dizziness and giddiness: Secondary | ICD-10-CM | POA: Diagnosis not present

## 2022-01-07 LAB — LACTIC ACID, PLASMA
Lactic Acid, Venous: 3.1 mmol/L (ref 0.5–1.9)
Lactic Acid, Venous: 1.9 mmol/L (ref 0.5–1.9)

## 2022-01-07 LAB — BASIC METABOLIC PANEL
Anion gap: 14 (ref 5–15)
Anion gap: 13 (ref 5–15)
Anion gap: 11 (ref 5–15)
BUN: 22 mg/dL — ABNORMAL HIGH (ref 6–20)
BUN: 21 mg/dL — ABNORMAL HIGH (ref 6–20)
BUN: 24 mg/dL — ABNORMAL HIGH (ref 6–20)
CO2: 18 mmol/L — ABNORMAL LOW (ref 22–32)
CO2: 18 mmol/L — ABNORMAL LOW (ref 22–32)
CO2: 22 mmol/L (ref 22–32)
Calcium: 9 mg/dL (ref 8.9–10.3)
Calcium: 8.9 mg/dL (ref 8.9–10.3)
Calcium: 9.2 mg/dL (ref 8.9–10.3)
Chloride: 100 mmol/L (ref 98–111)
Chloride: 101 mmol/L (ref 98–111)
Chloride: 102 mmol/L (ref 98–111)
Creatinine, Ser: 0.69 mg/dL (ref 0.61–1.24)
Creatinine, Ser: 0.65 mg/dL (ref 0.61–1.24)
Creatinine, Ser: 0.57 mg/dL — ABNORMAL LOW (ref 0.61–1.24)
GFR, Estimated: >60 mL/min (ref >60)
GFR, Estimated: >60 mL/min (ref >60)
GFR, Estimated: >60 mL/min (ref >60)
Glucose, Bld: 365 mg/dL — ABNORMAL HIGH (ref 70–99)
Glucose, Bld: 303 mg/dL — ABNORMAL HIGH (ref 70–99)
Glucose, Bld: 185 mg/dL — ABNORMAL HIGH (ref 70–99)
Potassium: 6.8 mmol/L (ref 3.5–5.1)
Potassium: 4.4 mmol/L (ref 3.5–5.1)
Potassium: 4.1 mmol/L (ref 3.5–5.1)
Sodium: 132 mmol/L — ABNORMAL LOW (ref 135–145)
Sodium: 132 mmol/L — ABNORMAL LOW (ref 135–145)
Sodium: 135 mmol/L (ref 135–145)

## 2022-01-07 LAB — CBC WITH DIFFERENTIAL/PLATELET
Abs Immature Granulocytes: 0.12 10*3/uL — ABNORMAL HIGH (ref 0.00–0.07)
Basophils Absolute: 0.1 10*3/uL (ref 0.0–0.1)
Basophils Relative: 0 %
Eosinophils Absolute: 0 10*3/uL (ref 0.0–0.5)
Eosinophils Relative: 0 %
HCT: 42.7 % (ref 39.0–52.0)
Hemoglobin: 13.7 g/dL (ref 13.0–17.0)
Immature Granulocytes: 1 %
Lymphocytes Relative: 7 %
Lymphs Abs: 1.1 10*3/uL (ref 0.7–4.0)
MCH: 30.6 pg (ref 26.0–34.0)
MCHC: 32.1 g/dL (ref 30.0–36.0)
MCV: 95.5 fL (ref 80.0–100.0)
Monocytes Absolute: 0.8 10*3/uL (ref 0.1–1.0)
Monocytes Relative: 5 %
Neutro Abs: 15 10*3/uL — ABNORMAL HIGH (ref 1.7–7.7)
Neutrophils Relative %: 87 %
Platelets: 341 10*3/uL (ref 150–400)
RBC: 4.47 MIL/uL (ref 4.22–5.81)
RDW: 14 % (ref 11.5–15.5)
WBC: 17.1 10*3/uL — ABNORMAL HIGH (ref 4.0–10.5)
nRBC: 0 % (ref 0.0–0.2)

## 2022-01-07 LAB — COMPREHENSIVE METABOLIC PANEL
ALT: 28 U/L (ref 0–44)
AST: 44 U/L — ABNORMAL HIGH (ref 15–41)
Albumin: 4 g/dL (ref 3.5–5.0)
Alkaline Phosphatase: 111 U/L (ref 38–126)
Anion gap: 18 — ABNORMAL HIGH (ref 5–15)
BUN: 24 mg/dL — ABNORMAL HIGH (ref 6–20)
CO2: 20 mmol/L — ABNORMAL LOW (ref 22–32)
Calcium: 9.6 mg/dL (ref 8.9–10.3)
Chloride: 93 mmol/L — ABNORMAL LOW (ref 98–111)
Creatinine, Ser: 0.74 mg/dL (ref 0.61–1.24)
GFR, Estimated: >60 mL/min (ref >60)
Glucose, Bld: 500 mg/dL — ABNORMAL HIGH (ref 70–99)
Potassium: 4.7 mmol/L (ref 3.5–5.1)
Sodium: 131 mmol/L — ABNORMAL LOW (ref 135–145)
Total Bilirubin: 0.8 mg/dL (ref 0.3–1.2)
Total Protein: 8 g/dL (ref 6.5–8.1)

## 2022-01-07 LAB — BETA-HYDROXYBUTYRIC ACID
Beta-Hydroxybutyric Acid: 5.8 mmol/L — ABNORMAL HIGH (ref 0.05–0.27)
Beta-Hydroxybutyric Acid: 3.46 mmol/L — ABNORMAL HIGH (ref 0.05–0.27)
Beta-Hydroxybutyric Acid: 1.83 mmol/L — ABNORMAL HIGH (ref 0.05–0.27)

## 2022-01-07 LAB — GLUCOSE, CAPILLARY
Glucose-Capillary: 140 mg/dL — ABNORMAL HIGH (ref 70–99)
Glucose-Capillary: 123 mg/dL — ABNORMAL HIGH (ref 70–99)

## 2022-01-07 LAB — MRSA NEXT GEN BY PCR, NASAL: MRSA by PCR Next Gen: NOT DETECTED

## 2022-01-07 LAB — CK: Total CK: 1089 U/L — ABNORMAL HIGH (ref 49–397)

## 2022-01-07 LAB — CBG MONITORING, ED
Glucose-Capillary: 343 mg/dL — ABNORMAL HIGH (ref 70–99)
Glucose-Capillary: 273 mg/dL — ABNORMAL HIGH (ref 70–99)

## 2022-01-07 MED ORDER — INSULIN REGULAR(HUMAN) IN NACL 100-0.9 UT/100ML-% IV SOLN
INTRAVENOUS | Status: DC
  Filled 2022-01-07: qty 100

## 2022-01-07 MED ORDER — ENOXAPARIN SODIUM 40 MG/0.4ML IJ SOSY
40.0000 mg | PREFILLED_SYRINGE | INTRAMUSCULAR | Status: DC
  Administered 2022-01-07 – 2022-01-08 (×2): 40 mg via SUBCUTANEOUS
  Filled 2022-01-07 (×2): qty 0.4

## 2022-01-07 MED ORDER — FOLIC ACID 1 MG PO TABS
1.0000 mg | ORAL_TABLET | Freq: Every day | ORAL | Status: DC
  Administered 2022-01-08 – 2022-01-09 (×2): 1 mg via ORAL
  Filled 2022-01-07 (×3): qty 1

## 2022-01-07 MED ORDER — INSULIN REGULAR(HUMAN) IN NACL 100-0.9 UT/100ML-% IV SOLN
INTRAVENOUS | Status: DC
  Administered 2022-01-07: 5 [IU]/h via INTRAVENOUS

## 2022-01-07 MED ORDER — FAMOTIDINE 20 MG PO TABS
20.0000 mg | ORAL_TABLET | Freq: Two times a day (BID) | ORAL | Status: DC
  Administered 2022-01-07 – 2022-01-09 (×4): 20 mg via ORAL
  Filled 2022-01-07 (×4): qty 1

## 2022-01-07 MED ORDER — INSULIN ASPART 100 UNIT/ML IJ SOLN
5.0000 [IU] | Freq: Once | INTRAMUSCULAR | Status: AC
  Administered 2022-01-07: 5 [IU] via SUBCUTANEOUS
  Filled 2022-01-07: qty 1

## 2022-01-07 MED ORDER — ASPIRIN 81 MG PO TBEC: 81.0000 mg | DELAYED_RELEASE_TABLET | Freq: Every day | ORAL | Status: DC

## 2022-01-07 MED ORDER — SODIUM CHLORIDE 0.9 % IV BOLUS
1000.0000 mL | Freq: Once | INTRAVENOUS | Status: AC
  Administered 2022-01-07: 1000 mL via INTRAVENOUS

## 2022-01-07 MED ORDER — ASPIRIN EC 81 MG PO TBEC
81.0000 mg | DELAYED_RELEASE_TABLET | Freq: Every day | ORAL | Status: DC
  Administered 2022-01-08 – 2022-01-09 (×2): 81 mg via ORAL
  Filled 2022-01-07 (×2): qty 1

## 2022-01-07 MED ORDER — METOPROLOL TARTRATE 50 MG PO TABS
50.0000 mg | ORAL_TABLET | Freq: Two times a day (BID) | ORAL | Status: DC
  Administered 2022-01-07 – 2022-01-08 (×2): 50 mg via ORAL
  Filled 2022-01-07 (×2): qty 1

## 2022-01-07 MED ORDER — SIMVASTATIN 10 MG PO TABS: 20.0000 mg | ORAL_TABLET | Freq: Every day | ORAL | Status: DC

## 2022-01-07 MED ORDER — POTASSIUM CHLORIDE 2 MEQ/ML IV SOLN
INTRAVENOUS | Status: DC
  Filled 2022-01-07 (×3): qty 1000

## 2022-01-07 MED ORDER — LISINOPRIL 10 MG PO TABS
10.0000 mg | ORAL_TABLET | Freq: Every day | ORAL | Status: DC
  Filled 2022-01-07: qty 1

## 2022-01-07 MED ORDER — DEXTROSE 50 % IV SOLN: 0.0000 mL | INTRAVENOUS | Status: DC | PRN

## 2022-01-07 MED ORDER — NICOTINE 21 MG/24HR TD PT24
21.0000 mg | MEDICATED_PATCH | Freq: Every day | TRANSDERMAL | Status: DC
  Administered 2022-01-08 – 2022-01-09 (×2): 21 mg via TRANSDERMAL
  Filled 2022-01-07 (×2): qty 1

## 2022-01-07 MED ORDER — LACTATED RINGERS IV SOLN: INTRAVENOUS | Status: DC

## 2022-01-07 MED ORDER — DEXTROSE IN LACTATED RINGERS 5 % IV SOLN: INTRAVENOUS | Status: DC

## 2022-01-07 MED ORDER — TRAZODONE HCL 50 MG PO TABS
150.0000 mg | ORAL_TABLET | Freq: Every day | ORAL | Status: DC
  Administered 2022-01-07 – 2022-01-08 (×2): 150 mg via ORAL
  Filled 2022-01-07 (×2): qty 1

## 2022-01-07 MED ORDER — BUSPIRONE HCL 10 MG PO TABS
10.0000 mg | ORAL_TABLET | Freq: Two times a day (BID) | ORAL | Status: DC
  Administered 2022-01-07 – 2022-01-09 (×4): 10 mg via ORAL
  Filled 2022-01-07: qty 1
  Filled 2022-01-07: qty 2
  Filled 2022-01-07: qty 1
  Filled 2022-01-07: qty 2

## 2022-01-07 MED ORDER — CHLORHEXIDINE GLUCONATE CLOTH 2 % EX PADS
6.0000 | MEDICATED_PAD | Freq: Every day | CUTANEOUS | Status: DC
  Administered 2022-01-07: 6 via TOPICAL

## 2022-01-07 MED ORDER — POTASSIUM CHLORIDE 10 MEQ/100ML IV SOLN
10.0000 meq | INTRAVENOUS | Status: DC
  Administered 2022-01-07: 10 meq via INTRAVENOUS
  Filled 2022-01-07: qty 100

## 2022-01-07 MED ORDER — LACTATED RINGERS IV SOLN
INTRAVENOUS | Status: DC
Start: 2022-01-07 — End: 2022-01-07
  Filled 2022-01-07: qty 1000

## 2022-01-07 NOTE — ED Notes (Signed)
RT called at this time to inform of VBG sent down to lab at this time.  ?

## 2022-01-07 NOTE — ED Triage Notes (Signed)
Pt ems from home s/p fall. Pt got dizzy and fell yesterday at approx. 1800 and laid in floor until found by caregiver at approx 1200 today. Pt c/o right hip pain. Has dressing on right elbow. Per pt has skin tear on elbow. ?

## 2022-01-07 NOTE — ED Notes (Signed)
MD Isaacs notified of pt's Lactic of 3.1 at this time.  ?

## 2022-01-07 NOTE — ED Notes (Signed)
This RN messaged MD Wouk at this time regarding pt BGL decreasing to 273 via peripheral finger stick, BHA decreasing to 3.46, and Agap closing to 13 post 5 units SQ insulin before MRI and fluid administration requesting pt to be treated with SQ insulin instead of insulin gtt d/t drastic changes in labs from minor interventions. This RN requested switch of insulin to also facilitate decreased admit LOC, expedite bed acquisition for pt on admit unit, and decrease risk of high acuity medication administration while holding in the ER with higher pt ratios. Awaiting further orders from MD.  ?

## 2022-01-07 NOTE — ED Notes (Signed)
Fluids finishing at this time before starting insulin protocol  ?

## 2022-01-07 NOTE — ED Notes (Signed)
This RN messaged MD Wouk at this time to notify of K of 6.8 at this time.  ?

## 2022-01-07 NOTE — ED Triage Notes (Incomplete)
FIRSt NU ?Pt via [facility] from home. Pt had a fall around 6:00pm last night, pt has been on the floor since then. Pt c/o L hip pain. No LOC.  ? ? ?

## 2022-01-07 NOTE — ED Notes (Signed)
Pt taken to MRI at this time

## 2022-01-07 NOTE — ED Notes (Signed)
MD Wouk instructed this RN to start insulin gtt after this RN voiced concerns. Almost shift change, this RN spoke with ICU Charge and aforementioned RN echoed this RN's concerns. This RN allowing night shift RN to make their decision on pt safety.  ?

## 2022-01-07 NOTE — ED Provider Notes (Signed)
? ?[facility] ?Provider Note ? ? ? Event Date/Time  ? First MD Initiated Contact with Patient 01/07/22 1359   ?  (approximate) ? ? ?History  ? ?Fall ? ? ?HPI ? ?Brandon [name] is a 56 y.o. male here with generalized weakness and fall.  Patient states that yesterday, he suddenly felt fairly weak and fell, landing onto his right side.  He states that he was unable to get up because his right arm and side would "not work."  He states that it felt like it was weak and numb.  Symptoms gradually improved to the point where he could try to crawl, but he was too weak to get up.  He has home health checks on him daily due to his history of stroke, who checked on him today and called EMS.  He states he felt fairly well prior to the fall yesterday.  Denies any known recent fevers or chills.  Denies any known recent medication changes.  He currently complains of 6 out of 10 aching right hip pain.  States he feels sore on his bottom and right side where he was laying all night.  States he also feels thirsty and has not had much to eat or drink. ?  ? ? ?Physical Exam  ? ?Triage Vital Signs: ?ED Triage Vitals  ?Enc Vitals Group  ?   BP 01/07/22 1318 108/82  ?   Pulse Rate 01/07/22 1318 95  ?   Resp 01/07/22 1318 20  ?   Temp 01/07/22 1318 97.8 ?F (36.6 ?C)  ?   Temp Source 01/07/22 1318 Oral  ?   SpO2 01/07/22 1318 96 %  ?   Weight 01/07/22 1319 135 lb (61.2 kg)  ?   Height 01/07/22 1319 6\' 1"  (1.854 m)  ?   Head Circumference --   ?   Peak Flow --   ?   Pain Score 01/07/22 1318 0  ?   Pain Loc --   ?   Pain Edu? --   ?   Excl. in GC? --   ? ? ?Most recent vital signs: ?Vitals:  ? 01/07/22 1452 01/07/22 1500  ?BP:  (!) 134/95  ?Pulse: 86 79  ?Resp:  20  ?Temp:    ?SpO2: 98% 100%  ? ? ? ?General: Awake, no distress.  Cachectic, disheveled. ?CV:  Good peripheral perfusion.  Regular rate and rhythm. ?Resp:  Normal effort.  ?Abd:  No distention.  No tenderness. ?Other:  Mild erythema throughout the right face,  shoulder, chest, and buttocks area where patient was laying overnight.  Superficial wounds to the buttocks area.  No deep wounds. ?Cranial nerves II through XII intact.  Strength 5 out of 5 bilateral upper and lower extremities although globally weak.  Cranial nerves intact.  Normal sensation to light touch. ? ? ?ED Results / Procedures / Treatments  ? ?Labs ?(all labs ordered are listed, but only abnormal results are displayed) ?Labs Reviewed  ?CBC WITH DIFFERENTIAL/PLATELET - Abnormal; Notable for the following components:  ?    Result Value  ? WBC 17.1 (*)   ? Neutro Abs 15.0 (*)   ? Abs Immature Granulocytes 0.12 (*)   ? All other components within normal limits  ?COMPREHENSIVE METABOLIC PANEL - Abnormal; Notable for the following components:  ? Sodium 131 (*)   ? Chloride 93 (*)   ? CO2 20 (*)   ? Glucose, Bld 500 (*)   ? BUN 24 (*)   ?  AST 44 (*)   ? Anion gap 18 (*)   ? All other components within normal limits  ?CK - Abnormal; Notable for the following components:  ? Total CK 1,089 (*)   ? All other components within normal limits  ?LACTIC ACID, PLASMA - Abnormal; Notable for the following components:  ? Lactic Acid, Venous 3.1 (*)   ? All other components within normal limits  ?BLOOD GAS, VENOUS - Abnormal; Notable for the following components:  ? pCO2, Ven 41 (*)   ? Bicarbonate 19.7 (*)   ? Acid-base deficit 6.6 (*)   ? All other components within normal limits  ?URINALYSIS, COMPLETE (UACMP) WITH MICROSCOPIC  ?LACTIC ACID, PLASMA  ?BETA-HYDROXYBUTYRIC ACID  ? ? ? ?EKG ? ? ? ?RADIOLOGY ?DG hip right: Negative ?Chest x-ray: Negative ?CT head: Remote infarcts, no acute abnormality ? ? ?I also independently reviewed and agree wit radiologist interpretations. ? ? ?PROCEDURES: ? ?Critical Care performed: Yes, see critical care procedure note(s) ? ?.Critical Care ?Performed by: Shaune Pollack, MD ?Authorized by: Shaune Pollack, MD  ? ?Critical care provider statement:  ?  Critical care time (minutes):  30 ?   Critical care time was exclusive of:  Separately billable procedures and treating other patients ?  Critical care was necessary to treat or prevent imminent or life-threatening deterioration of the following conditions:  Cardiac failure, circulatory failure and respiratory failure ?  Critical care was time spent personally by me on the following activities:  Development of treatment plan with patient or surrogate, discussions with consultants, evaluation of patient's response to treatment, examination of patient, ordering and review of laboratory studies, ordering and review of radiographic studies, ordering and performing treatments and interventions, pulse oximetry, re-evaluation of patient's condition and review of old charts ? ? ? ?MEDICATIONS ORDERED IN ED: ?Medications  ?insulin regular, human (MYXREDLIN) 100 units/ 100 mL infusion (has no administration in time range)  ?lactated ringers infusion (has no administration in time range)  ?dextrose 5 % in lactated ringers infusion (has no administration in time range)  ?dextrose 50 % solution 0-50 mL (has no administration in time range)  ?potassium chloride 10 mEq in 100 mL IVPB (has no administration in time range)  ?sodium chloride 0.9 % bolus 1,000 mL (1,000 mLs Intravenous New Bag/Given 01/07/22 1421)  ? ? ? ?IMPRESSION / MDM / ASSESSMENT AND PLAN / ED COURSE  ?I reviewed the triage vital signs and the nursing notes. ?             ?               ? ? ?The patient is on the cardiac monitor to evaluate for evidence of arrhythmia and/or significant heart rate changes. ? ? ? ?MDM:  ?56 year old male here with significant past medical history including history of alcohol abuse, coronary disease, prior CVA, hypertension, hyperlipidemia, here with fall and weakness.  Regarding his fall, possible concern for stroke given that it seems to be related to acute right-sided weakness which limited his ability to get up.  Patient also has a history of alcohol intoxication.   Currently, the patient appears dehydrated, disheveled, and cachectic.  Lab work reviewed and is notable for lactic acidosis.  CMP shows signs of possible mild diabetic ketoacidosis with glucose 500, bicarb 20, anion gap of 18.  Patient also has mild rhabdomyolysis with CK of 1089.  No trauma noted on CT head or C-spine, which I reviewed independently.  Plain films also showed no acute  fracture.  We will plan to admit for IV fluids, management of his DKA and lactic acidosis, as well as possible further work-up of strokelike symptoms.  Patient updated and is in agreement with this plan. ? ? ?MEDICATIONS GIVEN IN ED: ?Medications  ?insulin regular, human (MYXREDLIN) 100 units/ 100 mL infusion (has no administration in time range)  ?lactated ringers infusion (has no administration in time range)  ?dextrose 5 % in lactated ringers infusion (has no administration in time range)  ?dextrose 50 % solution 0-50 mL (has no administration in time range)  ?potassium chloride 10 mEq in 100 mL IVPB (has no administration in time range)  ?sodium chloride 0.9 % bolus 1,000 mL (1,000 mLs Intravenous New Bag/Given 01/07/22 1421)  ? ? ? ?Consults:  ?Hospitalist consulted for admission ? ? ?EMR reviewed  ?Prior ED visits reviewed ? ? ? ? ?FINAL CLINICAL IMPRESSION(S) / ED DIAGNOSES  ? ?Final diagnoses:  ?Diabetic ketoacidosis without coma associated with type 2 diabetes mellitus (HCC)  ? ? ? ?Rx / DC Orders  ? ?ED Discharge Orders   ? ? None  ? ?  ? ? ? ?Note:  This document was prepared using Dragon voice recognition software and may include unintentional dictation errors. ?  ?Shaune Pollack, MD ?01/07/22 1555 ? ?

## 2022-01-07 NOTE — ED Notes (Signed)
THIS TECH PERFORMED A COMPLETE WIPE BATH/INC CARE. DRY BRIEF APPLIED. ?

## 2022-01-07 NOTE — H&P (Signed)
?History and Physical  ? ? ?Brandon [name] [id number] DOB: [birth year] DOA: 01/07/2022 ? ?PCP: Remi Haggard, FNP  ?Patient coming from: home ? ? ?Chief Complaint: can't get up ? ?HPI: Brandon [name] is a 56 y.o. male with medical history significant for former alcohol abuse, history CVA with residual right sided weakness, CAD, T2DM, HTN, upper gi bleed, seizures, who presents with the above. ? ?Reports yesterday went to the bathroom and fell. Experienced right arm weakness and was unable to rise himself. Thinks he was on the floor for about 12 hours. His caregiver found him the following day. Denies pain. No abd pain or chest pain or dysuria or nausea/vomiting. Thinks right arm remains weaker than baseline. Says he is responsible for his own insulin but sister is responsible for his oral meds.  ? ?ED Course:  ? ?Labs suggestive of dka. Given fluids and IV insulin. Imaging negative for CVA and fracture ? ?Review of Systems: As per HPI otherwise 10 point review of systems negative.  ? ? ?Past Medical History:  ?Diagnosis Date  ? Alcohol dependency ([place])   ? Hx ETOH withdrawal seizures before 2011  ? CAD (coronary artery disease) 06/13/2012  ? Calcification noted on CTA of chest in 2012 [place] abnormality on ECHO    ? Carotid artery disease ([place]) 2016  ? bilateral.  s/p left carotid stent 03/2015  ? 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 right ICA occlusion 05/2012, right MCA CVA 02/2016.   ? Depression with anxiety   ? Diabetes mellitus without complication ([place])   ? GERD (gastroesophageal reflux disease)   ? Headache(784.0)   ? migraine  ? Heart disease 02/2015  ? PCI/DES placed to RCA: on chronic Plavix/ASA  ? Hyperlipidemia   ? Hypertension   ? Pancreatitis 2011....   ? CT findings in May 2011 with inflammation and pseudocyst.  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/2016  ? Renal artery stenosis, native, bilateral ([place]) 02/2015  ? ? ?Past Surgical History:  ?Procedure Laterality Date  ? CARDIAC  CATHETERIZATION N/A 03/15/2015  ? Procedure: Left Heart Cath;  Surgeon: Dionisio David, MD;  Location: [facility];  Service: Cardiovascular;  Laterality: N/A;  ? CARDIAC CATHETERIZATION N/A 03/16/2015  ? Procedure: Coronary Stent Intervention;  Surgeon: Yolonda Kida, MD;  Location: [facility];  Service: Cardiovascular;  Laterality: N/A;  ? CAROTID ANGIOGRAM N/A 06/15/2012  ? Procedure: CAROTID ANGIOGRAM;  Surgeon: Angelia Mould, MD;  Location: [facility] CATH [facility];  Service: Cardiovascular;  Laterality: N/A;  ? ESOPHAGOGASTRODUODENOSCOPY N/A 03/08/2016  ? Procedure: ESOPHAGOGASTRODUODENOSCOPY (EGD);  Surgeon: Manus Gunning, MD;  Location: [facility];  Service: Gastroenterology;  Laterality: N/A;  ? none    ? PERIPHERAL VASCULAR CATHETERIZATION Left 04/06/2015  ? Procedure: Carotid PTA/Stent Intervention;  Surgeon: Algernon Huxley, MD;  Location: [facility];  Service: Cardiovascular;  Laterality: Left;  ? ? ? reports that he has been smoking cigarettes. He has a 30.00 pack-year smoking history. He has never used smokeless tobacco. He reports that he does not drink alcohol and does not use drugs. ? ?Allergies  ?Allergen Reactions  ? No Known Allergies   ? ? ?Family History  ?Problem Relation Age of Onset  ? Stroke Mother   ?     deceased  ? Coronary artery disease Mother   ? Alcohol abuse Mother   ? Cancer Mother   ? Hypertension Father   ?  alive  ? Alcohol abuse Father   ? Diabetes Father   ? Kidney disease Father   ? Stroke Maternal Grandmother   ? ? ?Prior to Admission medications   ?Medication Sig Start Date End Date Taking? Authorizing Provider  ?albuterol (PROVENTIL) (2.5 MG/3ML) 0.083% nebulizer solution Take 3 mLs (2.5 mg total) by nebulization every 6 (six) hours as needed for wheezing or shortness of breath. ?Patient not taking: Reported on 11/15/2021 12/23/20   Caren Griffins, MD  ?ASPIRIN LOW DOSE 81 MG EC tablet Take 1 tablet (81 mg total) by mouth daily. 12/23/20    Caren Griffins, MD  ?busPIRone (BUSPAR) 10 MG tablet Take 1 tablet (10 mg total) by mouth 2 (two) times daily. 12/23/20   Caren Griffins, MD  ?clopidogrel (PLAVIX) 75 MG tablet Take 1 tablet (75 mg total) by mouth daily. ?Patient not taking: Reported on 11/15/2021 12/23/20   Caren Griffins, MD  ?cyclobenzaprine (FLEXERIL) 10 MG tablet Take 1 tablet (10 mg total) by mouth every 12 (twelve) hours. 12/23/20   Caren Griffins, MD  ?famotidine (PEPCID) 20 MG tablet Take 1 tablet (20 mg total) by mouth 2 (two) times daily. 12/23/20   Caren Griffins, MD  ?FEROSUL 325 (65 Fe) MG tablet Take 1 tablet (325 mg total) by mouth daily. ?Patient not taking: Reported on 11/15/2021 12/23/20   Caren Griffins, MD  ?folic acid (FOLVITE) 1 MG tablet Take 1 tablet (1 mg total) by mouth daily. 12/23/20   Caren Griffins, MD  ?insulin aspart (NOVOLOG) 100 UNIT/ML injection Inject into the skin 3 (three) times daily before meals. Sliding scale    [provider]  ?insulin detemir (LEVEMIR) 100 UNIT/ML FlexPen Inject 12 Units into the skin at bedtime. 12/23/20   Caren Griffins, MD  ?Insulin Pen Needle (PEN NEEDLES) 31G X 5 MM MISC 1 each by Does not apply route daily. 12/23/20   Caren Griffins, MD  ?lisinopril (ZESTRIL) 10 MG tablet Take 1 tablet (10 mg total) by mouth daily. 12/23/20   Caren Griffins, MD  ?metFORMIN (GLUCOPHAGE) 1000 MG tablet Take 1 tablet (1,000 mg total) by mouth 2 (two) times daily with a meal. 12/23/20   Gherghe, Vella Redhead, MD  ?metoprolol tartrate (LOPRESSOR) 50 MG tablet Take 1 tablet (50 mg total) by mouth 2 (two) times daily. 12/23/20   Caren Griffins, MD  ?Multiple Vitamin (MULTIVITAMIN WITH MINERALS) TABS tablet Take 1 tablet by mouth daily. 12/23/20   Caren Griffins, MD  ?nicotine (NICODERM CQ - DOSED IN MG/24 HOURS) 14 mg/24hr patch Place 1 patch (14 mg total) onto the skin daily. ?Patient taking differently: Place 14 mg onto the skin daily. Uses sometimes 12/23/20   Caren Griffins, MD   ?senna-docusate (SENOKOT-S) 8.6-50 MG tablet Take 1 tablet by mouth at bedtime. ?Patient not taking: Reported on 11/15/2021 02/19/20   Florencia Reasons, MD  ?simvastatin (ZOCOR) 20 MG tablet Take 1 tablet (20 mg total) by mouth daily at 6 PM. 12/23/20   Caren Griffins, MD  ?traZODone (DESYREL) 150 MG tablet Take 1 tablet (150 mg total) by mouth at bedtime. 12/23/20   Caren Griffins, MD  ? ? ?Physical Exam: ?Vitals:  ? 01/07/22 1319 01/07/22 1449 01/07/22 1452 01/07/22 1500  ?BP:  (!) 144/90  (!) 134/95  ?Pulse:  86 86 79  ?Resp:  20  20  ?Temp:      ?TempSrc:      ?SpO2:  98%  98% 100%  ?Weight: 61.2 kg     ?Height: 6\' 1"  (1.854 m)     ? ? ?Constitutional: No acute distress, chronically ill appearing ?Head: Atraumatic ?Eyes: Conjunctiva clear ?ENM: dry mucous membranes. Normal dentition.  ?Neck: Supple ?Respiratory: Clear to auscultation bilaterally, no wheezing/rales/rhonchi. Normal respiratory effort. No accessory muscle use. Marland Kitchen ?Cardiovascular: Regular rate and rhythm. No murmurs/rubs/gallops. ?Abdomen: Non-tender, non-distended. No masses. No rebound or guarding. Positive bowel sounds. ?Musculoskeletal: No joint deformity upper and lower extremities. Normal ROM, no contractures. normal muscle tone.  ?Skin: No rashes, lesions, or ulcers.  ?Extremities: No peripheral edema. Palpable peripheral pulses. ?Neurologic: Alert, moving all 4 extremities. Mild right upper arm weakness when compared to left. Cn 2-12 grossly intact ?Psychiatric: Normal insight and judgement. ? ? ?Labs on Admission: I have personally reviewed following labs and imaging studies ? ?CBC: ?Recent Labs  ?Lab 01/07/22 ?1326  ?WBC 17.1*  ?NEUTROABS 15.0*  ?HGB 13.7  ?HCT 42.7  ?MCV 95.5  ?PLT 341  ? ?Basic Metabolic Panel: ?Recent Labs  ?Lab 01/07/22 ?1326  ?NA 131*  ?K 4.7  ?CL 93*  ?CO2 20*  ?GLUCOSE 500*  ?BUN 24*  ?CREATININE 0.74  ?CALCIUM 9.6  ? ?GFR: ?Estimated Creatinine Clearance: 88.2 mL/min (by C-G formula based on SCr of 0.74 mg/dL). ?Liver  Function Tests: ?Recent Labs  ?Lab 01/07/22 ?1326  ?AST 44*  ?ALT 28  ?ALKPHOS 111  ?BILITOT 0.8  ?PROT 8.0  ?ALBUMIN 4.0  ? ?No results for input(s): LIPASE, AMYLASE in the last 168 hours. ?No results for input(s): AMMO

## 2022-01-07 NOTE — ED Notes (Signed)
This RN spoke with MD Wouk at this time, holding off on insulin gtt so pt can go to MRI. MD going to place one time IV insulin dose to be given before MRI.  ?

## 2022-01-08 DIAGNOSIS — I6523 Occlusion and stenosis of bilateral carotid arteries: Secondary | ICD-10-CM

## 2022-01-08 DIAGNOSIS — I2510 Atherosclerotic heart disease of native coronary artery without angina pectoris: Secondary | ICD-10-CM

## 2022-01-08 DIAGNOSIS — E1010 Type 1 diabetes mellitus with ketoacidosis without coma: Secondary | ICD-10-CM

## 2022-01-08 DIAGNOSIS — L8990 Pressure ulcer of unspecified site, unspecified stage: Secondary | ICD-10-CM | POA: Insufficient documentation

## 2022-01-08 DIAGNOSIS — R296 Repeated falls: Secondary | ICD-10-CM

## 2022-01-08 LAB — BASIC METABOLIC PANEL
Anion gap: 6 (ref 5–15)
Anion gap: 8 (ref 5–15)
Anion gap: 9 (ref 5–15)
BUN: 23 mg/dL — ABNORMAL HIGH (ref 6–20)
BUN: 24 mg/dL — ABNORMAL HIGH (ref 6–20)
BUN: 24 mg/dL — ABNORMAL HIGH (ref 6–20)
CO2: 22 mmol/L (ref 22–32)
CO2: 22 mmol/L (ref 22–32)
CO2: 25 mmol/L (ref 22–32)
Calcium: 8.6 mg/dL — ABNORMAL LOW (ref 8.9–10.3)
Calcium: 8.7 mg/dL — ABNORMAL LOW (ref 8.9–10.3)
Calcium: 8.9 mg/dL (ref 8.9–10.3)
Chloride: 103 mmol/L (ref 98–111)
Chloride: 104 mmol/L (ref 98–111)
Chloride: 104 mmol/L (ref 98–111)
Creatinine, Ser: 0.54 mg/dL — ABNORMAL LOW (ref 0.61–1.24)
Creatinine, Ser: 0.58 mg/dL — ABNORMAL LOW (ref 0.61–1.24)
Creatinine, Ser: 0.64 mg/dL (ref 0.61–1.24)
GFR, Estimated: >60 mL/min (ref >60)
GFR, Estimated: >60 mL/min (ref >60)
GFR, Estimated: >60 mL/min (ref >60)
Glucose, Bld: 166 mg/dL — ABNORMAL HIGH (ref 70–99)
Glucose, Bld: 210 mg/dL — ABNORMAL HIGH (ref 70–99)
Glucose, Bld: 98 mg/dL (ref 70–99)
Potassium: 3.6 mmol/L (ref 3.5–5.1)
Potassium: 3.8 mmol/L (ref 3.5–5.1)
Potassium: 3.9 mmol/L (ref 3.5–5.1)
Sodium: 134 mmol/L — ABNORMAL LOW (ref 135–145)
Sodium: 134 mmol/L — ABNORMAL LOW (ref 135–145)
Sodium: 135 mmol/L (ref 135–145)

## 2022-01-08 LAB — LACTIC ACID, PLASMA: Lactic Acid, Venous: 0.9 mmol/L (ref 0.5–1.9)

## 2022-01-08 LAB — GLUCOSE, CAPILLARY
Glucose-Capillary: 103 mg/dL — ABNORMAL HIGH (ref 70–99)
Glucose-Capillary: 125 mg/dL — ABNORMAL HIGH (ref 70–99)
Glucose-Capillary: 163 mg/dL — ABNORMAL HIGH (ref 70–99)
Glucose-Capillary: 175 mg/dL — ABNORMAL HIGH (ref 70–99)
Glucose-Capillary: 186 mg/dL — ABNORMAL HIGH (ref 70–99)
Glucose-Capillary: 240 mg/dL — ABNORMAL HIGH (ref 70–99)
Glucose-Capillary: 303 mg/dL — ABNORMAL HIGH (ref 70–99)
Glucose-Capillary: 384 mg/dL — ABNORMAL HIGH (ref 70–99)
Glucose-Capillary: 76 mg/dL (ref 70–99)

## 2022-01-08 LAB — BLOOD GAS, VENOUS
Acid-base deficit: 6.6 mmol/L — ABNORMAL HIGH (ref 0.0–2.0)
Bicarbonate: 19.7 mmol/L — ABNORMAL LOW (ref 20.0–28.0)
O2 Saturation: 53 %
Patient temperature: 37
pCO2, Ven: 41 mmHg — ABNORMAL LOW (ref 44–60)
pH, Ven: 7.29 (ref 7.25–7.43)

## 2022-01-08 LAB — HEMOGLOBIN A1C
Hgb A1c MFr Bld: 13.1 % — ABNORMAL HIGH (ref 4.8–5.6)
Mean Plasma Glucose: 329.27 mg/dL

## 2022-01-08 LAB — CK: Total CK: 716 U/L — ABNORMAL HIGH (ref 49–397)

## 2022-01-08 LAB — HIV ANTIBODY (ROUTINE TESTING W REFLEX): HIV Screen 4th Generation wRfx: NONREACTIVE

## 2022-01-08 LAB — BETA-HYDROXYBUTYRIC ACID: Beta-Hydroxybutyric Acid: 2.25 mmol/L — ABNORMAL HIGH (ref 0.05–0.27)

## 2022-01-08 MED ORDER — LACTATED RINGERS IV SOLN: INTRAVENOUS | Status: DC

## 2022-01-08 MED ORDER — NOREPINEPHRINE 4 MG/250ML-% IV SOLN: 2.0000 ug/min | INTRAVENOUS | Status: DC

## 2022-01-08 MED ORDER — GLUCERNA SHAKE PO LIQD
237.0000 mL | Freq: Two times a day (BID) | ORAL | Status: DC
Start: 2022-01-08 — End: 2022-01-10
  Administered 2022-01-08 – 2022-01-09 (×3): 237 mL via ORAL

## 2022-01-08 MED ORDER — ADULT MULTIVITAMIN W/MINERALS CH
1.0000 | ORAL_TABLET | Freq: Every day | ORAL | Status: DC
  Administered 2022-01-08 – 2022-01-09 (×2): 1 via ORAL
  Filled 2022-01-08 (×2): qty 1

## 2022-01-08 MED ORDER — SODIUM CHLORIDE 0.9 % IV SOLN: INTRAVENOUS | Status: DC

## 2022-01-08 MED ORDER — INSULIN ASPART 100 UNIT/ML IJ SOLN
0.0000 [IU] | Freq: Three times a day (TID) | INTRAMUSCULAR | Status: DC
  Administered 2022-01-08: 3 [IU] via SUBCUTANEOUS
  Administered 2022-01-08: 2 [IU] via SUBCUTANEOUS
  Administered 2022-01-08: 7 [IU] via SUBCUTANEOUS
  Administered 2022-01-09: 9 [IU] via SUBCUTANEOUS
  Administered 2022-01-09: 5 [IU] via SUBCUTANEOUS
  Administered 2022-01-09: 7 [IU] via SUBCUTANEOUS
  Filled 2022-01-08 (×5): qty 1

## 2022-01-08 MED ORDER — INSULIN ASPART 100 UNIT/ML IJ SOLN
0.0000 [IU] | Freq: Every day | INTRAMUSCULAR | Status: DC
  Administered 2022-01-08: 5 [IU] via SUBCUTANEOUS
  Filled 2022-01-08: qty 1

## 2022-01-08 MED ORDER — LACTATED RINGERS IV BOLUS
1000.0000 mL | Freq: Once | INTRAVENOUS | Status: AC
  Administered 2022-01-08: 1000 mL via INTRAVENOUS

## 2022-01-08 MED ORDER — ENSURE MAX PROTEIN PO LIQD
11.0000 [oz_av] | Freq: Every day | ORAL | Status: DC
  Administered 2022-01-08: 11 [oz_av] via ORAL
  Filled 2022-01-08: qty 330

## 2022-01-08 MED ORDER — CHLORHEXIDINE GLUCONATE CLOTH 2 % EX PADS
6.0000 | MEDICATED_PAD | Freq: Every day | CUTANEOUS | Status: DC
  Administered 2022-01-08 – 2022-01-09 (×2): 6 via TOPICAL

## 2022-01-08 MED ORDER — INSULIN DETEMIR 100 UNIT/ML ~~LOC~~ SOLN
12.0000 [IU] | SUBCUTANEOUS | Status: DC
  Administered 2022-01-08 – 2022-01-09 (×2): 12 [IU] via SUBCUTANEOUS
  Filled 2022-01-08 (×2): qty 0.12

## 2022-01-08 MED ORDER — ACETAMINOPHEN 325 MG PO TABS
650.0000 mg | ORAL_TABLET | Freq: Four times a day (QID) | ORAL | Status: DC | PRN
  Administered 2022-01-08 – 2022-01-09 (×4): 650 mg via ORAL
  Filled 2022-01-08 (×4): qty 2

## 2022-01-08 MED ORDER — THIAMINE HCL 100 MG PO TABS
100.0000 mg | ORAL_TABLET | Freq: Every day | ORAL | Status: DC
Start: 2022-01-08 — End: 2022-01-10
  Administered 2022-01-08 – 2022-01-09 (×2): 100 mg via ORAL
  Filled 2022-01-08: qty 1

## 2022-01-08 MED ORDER — SODIUM CHLORIDE 0.9 % IV SOLN
250.0000 mL | INTRAVENOUS | Status: DC
Start: 2022-01-08 — End: 2022-01-10

## 2022-01-08 NOTE — Progress Notes (Addendum)
? ?      CROSS COVER NOTE ? ?NAME: Brandon [name] ?MRN: [id number] ?DOB : 01-18-1966 ? ? ? ?Notified by nursing that insulin drip has been paused for ~2 hours per Endotool due to CBG of 103 and 76. CBG now 125.  BP 80/60. 1L LR ordered. ? ?Chart reviewed.  Gap is closed and beta-hydroxybutyrate level normalizing.   ? ?Patient is on 12U of Levemir qHS at home - will place on basal/bolus insulin therapy. This will include 12U of Levemir daily.   ? ?Cardiac/Diabetic diet additionally started.  Insulin infusion to be stopped 2 hrs after administration of basal insulin administration.    ? ?Initiating accuchecks QAC and QHS with sliding scale insulin. ? ?Bishop Limbo MHA, MSN, FNP-BC ?Nurse Practitioner ?[facility] ? ?Pager [phone] ? ?

## 2022-01-08 NOTE — Progress Notes (Addendum)
Odd affect. Uninterested in life. He only lives to smoke at home. Emaciated 56 year old male admitted this am for DKA. He lives alone with help coming to his house 3 hours a day. He was found this morning on his floor by a caregiver. Patient was sent to ICU for DKA. He has cognitive impairment from 2 previous CVAs. with weakness in his legs. Patient is unable to remember to order meals. He is incontinent of stool and has had urinary retention today. Patient was in and out catherized x 2 for greater than 600 in his bladder. Foley left in the 2nd time. Referral to Transition of care for placement in a more supervised living conditions.Right sided abrasions noted from fall? DKA resolved by 5 am and AC and HS blood sugars controlled with regular insulin.1720 Report called to [facility] RN on 1A ?

## 2022-01-08 NOTE — Assessment & Plan Note (Signed)
Multifactorial.  We will get PT, OT evaluation ?

## 2022-01-08 NOTE — Assessment & Plan Note (Signed)
Continue Levemir and sliding scale ?

## 2022-01-08 NOTE — Assessment & Plan Note (Signed)
DKA resolved with insulin drip.  Now transition to subcu insulin. ?

## 2022-01-08 NOTE — Evaluation (Signed)
Occupational Therapy Evaluation ?Patient Details ?Name: Brandon [name] ?MRN: [id number] ?DOB: [birth year] ?Today's Date: 01/08/2022 ? ? ?History of Present Illness Pt is a 56 y.o. male presenting to hospital 4/17 s/p fall (pt felt weak and fell landing on R side and unable to get up--R arm and side would "not work" d/t feeling weak and numb; symptoms improved so pt could try to crawl but unable to get up (on floor for about 12 hours before caregiver found him next day).  Imaging negative for CVA and fx.  Pt admitted with DKA, fall at home, rhabdomyolysis, and R arm weakness.  PMH includes h/o alcohol abuse, coronary disease, h/o CVA with residual R sided weakness, upper GI bleed, seizures, htn, and HLD.  ? ?Clinical Impression ?  ?Pt seen for OT evaluation this date. Prior to admission, pt was MOD-I (increased time/efffort) for toileting, grooming, and UB dressing, and was living on the main level of a 2-story home with his sister. Pt has home health aid 3hrs/day M-F, who assist with LB ADLs and meals. Pt denies any other falls within the past 6 months. Pt currently requires MAX A for seated LB dressing, MIN GUARD for functional mobility of short household distances with RW, SUPERVISION for transfer to/from [facility], and SUPERVISION for standing grooming tasks d/t decreased balance, strength, and activity tolerance. Pt would benefit from additional skilled OT services to maximize return to PLOF and minimize risk of future falls, injury, caregiver burden, and readmission. Upon discharge, recommend [place] services.     ?   ? ?Recommendations for follow up therapy are one component of a multi-disciplinary discharge planning process, led by the attending physician.  Recommendations may be updated based on patient status, additional functional criteria and insurance authorization.  ? ?Follow Up Recommendations ? Home health OT  ?  ?Assistance Recommended at Discharge Intermittent Supervision/Assistance  ?Patient can return home  with the following A little help with walking and/or transfers;A little help with bathing/dressing/bathroom;Assistance with cooking/housework ? ?  ?Functional Status Assessment ? Patient has had a recent decline in their functional status and demonstrates the ability to make significant improvements in function in a reasonable and predictable amount of time.  ?Equipment Recommendations ? None recommended by OT  ?  ?   ?Precautions / Restrictions Precautions ?Precautions: Fall ?Restrictions ?Weight Bearing Restrictions: No  ? ?  ? ?Mobility Bed Mobility ?  ?  ?  ?  ?  ?  ?  ?General bed mobility comments: Not assessed; pt in recliner beginning/end of session ?  ? ?Transfers ?Overall transfer level: Needs assistance ?Equipment used: Rolling walker (2 wheels) ?Transfers: Sit to/from Stand ?Sit to Stand: Supervision ?  ?  ?  ?  ?  ?  ?  ? ?  ?Balance Overall balance assessment: Needs assistance ?Sitting-balance support: No upper extremity supported, Feet supported ?Sitting balance-Leahy Scale: Good ?Sitting balance - Comments: steady sitting reaching within BOS ?  ?Standing balance support: During functional activity, No upper extremity supported ?Standing balance-Leahy Scale: Good ?Standing balance comment: requires only supervision for standing grooming tasks ?  ?  ?  ?  ?  ?  ?  ?  ?  ?  ?  ?   ? ?ADL either performed or assessed with clinical judgement  ? ?ADL Overall ADL's : Needs assistance/impaired ?  ?  ?Grooming: Wash/dry hands;Supervision/safety;Standing ?  ?  ?  ?  ?  ?  ?  ?Lower Body Dressing: Maximal assistance;Sitting/lateral leans ?Lower Body Dressing Details (  indicate cue type and reason): to don socks (pt reports he needs assistance at baseline to perform) ?Toilet Transfer: Supervision/safety;Ambulation;BSC/3in1;Rolling walker (2 wheels) ?  ?  ?  ?  ?  ?Functional mobility during ADLs: Min guard;Rolling walker (2 wheels) (to walk 63ft) ?   ? ? ? ?Vision Ability to See in Adequate Light: 0  Adequate ?Patient Visual Report: No change from baseline ?   ?   ?   ?   ? ?Pertinent Vitals/Pain Pain Assessment ?Pain Assessment: 0-10 ?Pain Score: 10-Worst pain ever ?Pain Location: back pain and R hip pain ?Pain Descriptors / Indicators: Aching, Sore, Tender ?Pain Intervention(s): Limited activity within patient's tolerance, Monitored during session, Repositioned, Patient requesting pain meds-RN notified  ? ? ? ?   ?Extremity/Trunk Assessment Upper Extremity Assessment ?Upper Extremity Assessment: Generalized weakness ?  ?Lower Extremity Assessment ?Lower Extremity Assessment: Defer to PT evaluation ?RLE Deficits / Details: hip flexion 4/5; knee flexion/extension 4/5; and DF 4/5 ?LLE Deficits / Details: hip flexion, knee flexion/extension, and DF at least 4+/5 ?  ?Cervical / Trunk Assessment ?Cervical / Trunk Assessment: Normal ?  ?Communication Communication ?Communication: No difficulties ?  ?Cognition Arousal/Alertness: Awake/alert ?Behavior During Therapy: [facility] for tasks assessed/performed, Flat affect ?Overall Cognitive Status: Within Functional Limits for tasks assessed ?  ?  ?  ?  ?  ?  ?  ?  ?  ?  ?  ?  ?  ?  ?  ?  ?  ?  ?  ?   ?   ?   ? ? ?Home Living Family/patient expects to be discharged to:: Private residence ?Living Arrangements: Other relatives (pt's sister (who works)) ?Available Help at Discharge: Family;Available PRN/intermittently ?Type of Home: House ?Home Access: Stairs to enter ?Entrance Stairs-Number of Steps: 5 ?Entrance Stairs-Rails: Right;Left;Can reach both ?Home Layout: Two level;Able to live on main level with bedroom/bathroom ?  ?  ?Bathroom Shower/Tub: Tub/shower unit;Sponge bathes at baseline ?  ?Bathroom Toilet: Standard ?  ?  ?Home Equipment: Rollator (4 wheels);Rolling Walker (2 wheels) ?  ?  ?  ? ?  ?Prior Functioning/Environment Prior Level of Function : Needs assist;History of Falls (last six months) ?  ?  ?  ?  ?  ?  ?Mobility Comments: Ambulatory with 4ww at baseline (likes  to be able to sit when needed). Denies any other falls within the past 6 months ?ADLs Comments: Pt MOD-I (increased time/efffort) for toileting, grooming, and UB dressing. Pt has home health aid 3hrs/day M-F, who assist with LB ADLs and meals; sister leaves pt prepared meals; pt takes sponge baths usually (pt reports hard to get in/out of tub shower) ?  ? ?  ?  ?OT Problem List: Decreased strength;Decreased activity tolerance;Impaired balance (sitting and/or standing);Pain ?  ?   ?OT Treatment/Interventions: Self-care/ADL training;Therapeutic exercise;Energy conservation;DME and/or AE instruction;Therapeutic activities;Patient/family education;Balance training  ?  ?OT Goals(Current goals can be found in the care plan section) Acute Rehab OT Goals ?Patient Stated Goal: to return home ?OT Goal Formulation: With patient ?Time For Goal Achievement: 01/22/22 ?Potential to Achieve Goals: Good ?ADL Goals ?Pt Will Perform Grooming: with modified independence;standing ?Pt Will Transfer to Toilet: with modified independence;ambulating;regular height toilet ?Pt Will Perform Toileting - Clothing Manipulation and hygiene: with modified independence;sit to/from stand  ?OT Frequency: Min 2X/week ?  ? ?   ?AM-PAC OT "6 Clicks" Daily Activity     ?Outcome Measure Help from another person eating meals?: None ?Help from another person taking care of  personal grooming?: A Little ?Help from another person toileting, which includes using toliet, bedpan, or urinal?: A Little ?Help from another person bathing (including washing, rinsing, drying)?: A Little ?Help from another person to put on and taking off regular upper body clothing?: A Little ?Help from another person to put on and taking off regular lower body clothing?: A Lot ?6 Click Score: 18 ?  ?End of Session Equipment Utilized During Treatment: Rolling walker (2 wheels) ?Nurse Communication: Mobility status;Patient requests pain meds ? ?Activity Tolerance: Patient tolerated  treatment well ?Patient left: in chair;with call bell/phone within reach ? ?OT Visit Diagnosis: Unsteadiness on feet (R26.81);History of falling (Z91.81);Muscle weakness (generalized) (M62.81);Pain ?Pain - part of body:

## 2022-01-08 NOTE — Progress Notes (Signed)
Initial Nutrition Assessment ? ?DOCUMENTATION CODES:  ? ?Underweight, Severe malnutrition in context of social or environmental circumstances ? ?INTERVENTION:  ? ?-Glucerna Shake po BID, each supplement provides 220 kcal and 10 grams of protein  ?-Ensure Max po daily, each supplement provides 150 kcal and 30 grams of protein. ?-MVI with minerals daily ?-Double protein portions with meals ?-Liberalize diet to carb modified ?-Reached out to [facility] team regarding food assistance resources ? ?NUTRITION DIAGNOSIS:  ? ?Severe Malnutrition related to social / environmental circumstances as evidenced by severe fat depletion, severe muscle depletion. ? ?GOAL:  ? ?Patient will meet greater than or equal to 90% of their needs ? ?MONITOR:  ? ?PO intake, Supplement acceptance, Labs, Weight trends, Skin, I & O's ? ?REASON FOR ASSESSMENT:  ? ?Malnutrition Screening Tool ?  ? ?ASSESSMENT:  ? ?Brandon [name] is a 56 y.o. male with medical history significant for former alcohol abuse, history CVA with residual right sided weakness, CAD, T2DM, HTN, upper gi bleed, seizures, who presents with the above. ? ?Pt admitted with DKA.  ? ?Reviewed I/O's: +290 ml x 24 hours ? ?UOP: 1.4 L x 24 hours ? ?Case discussed with RN, who requests RD evaluate pt due to underweight status.  ? ?Spoke with pt, who was sitting in recliner chair at time of visit. He had just ordered his lunch and states "I feel better now that I got some food in me". Pt shares he tries to consumes 3 meals per day, which consist of a meat, starch, and vegetable. He reports he consumes either [place] or [place] frozen dinners and occasionally will consume food that his sister brings over and prepares for him. Per pt, "I try to eat, but sometimes food is hard to come by". He reports he tried to receive assistance from Meals on Wheels, but he shares he was too young to be eligible for this service. Pt shares he is hungry and is requesting to receive "as much food as possible  without messing up my blood sugar".  ? ?Pt reports UBW of around 180-200#. He estimates he has lost about 50 pounds in the past year. He reports that he used to have [employer] send to this home, however, this service was discontinued.  ? ?Discussed home DM control with pt. He shares he has a dexcom CGM, which he started using a month ago; he really enjoys this due to less frequent fingersticks. He reports he takes insulin 2 times per day after meals ("the blue and orange pen") in addition to metformin. His MD is managed by his PCP.  ? ?Discussed importance of good meal and supplement intake to promote healing. Pt amenable to supplements and double protein. Will also liberalize diet to carb modified for wider variety of meal selections.  ? ?Reached out to [facility] team for possible food assistance resources.  ?  ?Medications reviewed and include folic acid and thiamine.  ? ?Lab Results  ?Component Value Date  ? HGBA1C 13.1 (H) 01/08/2022  ? PTA DM medications are 3 units insulin aspart TID and 12 units insulin determir at bedtime.  ? ?Labs reviewed: Na: 134, Mg: 1.6, CBGS: 76-240 (inpatient orders for glycemic control are 0-5 units insulin aspart daily at bedtime, 0-9 units insulin aspart TID with meals and 12 units insulin determir daily).   ? ?NUTRITION - FOCUSED PHYSICAL EXAM: ? ?Flowsheet Row Most Recent Value  ?Orbital Region Severe depletion  ?Upper Arm Region Severe depletion  ?Thoracic and Lumbar Region Severe depletion  ?  Buccal Region Severe depletion  ?Temple Region Severe depletion  ?Clavicle Bone Region Severe depletion  ?Clavicle and Acromion Bone Region Severe depletion  ?Scapular Bone Region Severe depletion  ?Dorsal Hand Severe depletion  ?Patellar Region Severe depletion  ?Anterior Thigh Region Severe depletion  ?Posterior Calf Region Severe depletion  ?Edema (RD Assessment) None  ?Hair Reviewed  ?Eyes Reviewed  ?Mouth Reviewed  ?Skin Reviewed  ?Nails Reviewed  ? ?  ? ? ?Diet Order:   ?Diet Order   ? ?        ?  Diet Carb Modified Fluid consistency: Thin; Room service appropriate? Yes  Diet effective now       ?  ? ?  ?  ? ?  ? ? ?EDUCATION NEEDS:  ? ?Education needs have been addressed ? ?Skin:  Skin Assessment: Skin Integrity Issues: ?Skin Integrity Issues:: Stage II ?Stage II: coccyx ? ?Last BM:  01/08/22 ? ?Height:  ? ?Ht Readings from Last 1 Encounters:  ?01/07/22 6\' 1"  (1.854 m)  ? ? ?Weight:  ? ?Wt Readings from Last 1 Encounters:  ?01/07/22 59.2 kg  ? ? ?Ideal Body Weight:  83.6 kg ? ?BMI:  Body mass index is 17.22 kg/m?. ? ?Estimated Nutritional Needs:  ? ?Kcal:  2150-2350 ? ?Protein:  120-135 grams ? ?Fluid:  > 2 L ? ? ? ?Loistine Chance, RD, LDN, CDCES ?Registered Dietitian II ?Certified Diabetes Care and Education Specialist ?Please refer to [facility] for RD and/or RD on-call/weekend/after hours pager  ?

## 2022-01-08 NOTE — Progress Notes (Signed)
?  Progress Note ? ? ?Patient: Brandon [name] [id number] DOB: 12-10-1965 DOA: 01/07/2022     1 ?DOS: the patient was seen and examined on 01/08/2022 ?  ?Brief hospital course: ?56 y.o. male with medical history significant for former alcohol abuse, history CVA with residual right sided weakness, CAD, T2DM, HTN, upper gi bleed, seizures admitted after being found on the floor s/p fall - unable to get up. Found to have  DKA in ED. ? ?4/18 - transitioned to SQ insulin, transfer to floor. PT, OT eval ? ? ?Assessment and Plan: ?* DKA (diabetic ketoacidosis) ([place]) ?DKA resolved with insulin drip.  Now transition to subcu insulin. ? ?Ischemic Stroke ?Patient has had history of stroke.  MRI of the brain this admission does not show any acute stroke. ? ?Hyperglycemia due to type 2 diabetes mellitus ([place]) ?Continue Levemir and sliding scale ? ?Frequent falls ?Multifactorial.  We will get PT, OT evaluation ? ?Rhabdomyolysis ?Monitor CK with hydration this is likely due to fall and muscle injury ?CK 716 on admission ? ?Alcohol Dependence ?Counseled for cessation he has failed at least 3 times in the past. ? ? ? ? ?  ? ?Subjective: Feeling weak.  Wants to eat ? ?Physical Exam: ?Vitals:  ? 01/08/22 1400 01/08/22 1500 01/08/22 1600 01/08/22 1700  ?BP: 131/85 104/71 115/78   ?Pulse: 67 (!) 56 62   ?Resp: 15 13 11    ?Temp:   97.7 ?F (36.5 ?C) (!) 97.5 ?F (36.4 ?C)  ?TempSrc:    Oral  ?SpO2: 100% 100% 97%   ?Weight:      ?Height:      ? ?56 year old male in No acute distress, chronically ill appearing ? ?Neck: Supple ?Respiratory: Clear to auscultation bilaterally, no wheezing/rales/rhonchi. Normal respiratory effort. No accessory muscle use. Marland Kitchen ?Cardiovascular: Regular rate and rhythm. No murmurs/rubs/gallops. ?Abdomen:  Soft, benign ?Extremities no pedal edema ?Neuro: Alert and oriented, nonfocal ?Psychiatric:  Flat affect ? ?Data Reviewed: ? ?Elevated blood sugar, elevated CK ? ?Family Communication: None at  bedside ? ?Disposition: ?Status is: Inpatient ?Remains inpatient appropriate because: PT, OT eval due to recurrent fall.  Better blood sugar control ? ? Planned Discharge Destination: Home with Home Health ? ? ? DVT prophylaxis- Lovenox ?Time spent: 35 minutes ? ?Author: ?Max Sane, MD ?01/08/2022 10:30 PM ? ?For on call review [email or web address].  ?

## 2022-01-08 NOTE — Assessment & Plan Note (Addendum)
Monitor CK with hydration this is likely due to fall and muscle injury ?CK 716 on admission ?

## 2022-01-08 NOTE — Assessment & Plan Note (Signed)
Counseled for cessation he has failed at least 3 times in the past. ?

## 2022-01-08 NOTE — Hospital Course (Addendum)
56 y.o. male with past medical history of previous alcohol abuse, history of CVA with residual right-sided weakness, type 2 diabetes mellitus, hypertension and seizures who presented to the emergency room on 4/17 after a fall the previous day and was on the ground for about 12 hours, being so weak, he was unable to get up.  In the emergency room, patient found to be in DKA with a CBG of 500 as well as an rhabdomyolysis with CK level of 716.  Patient was started on IV fluids and insulin drip which was able to be weaned off by 4/17.  Patient was still noted to be quite weak and continued received IV fluids.  By 4/19, CK level down to 400s and he was able to ambulate better.  Felt to be okay to discharge home. ? ?

## 2022-01-08 NOTE — Progress Notes (Signed)
Protocol consult received for US guided IV due to vasopressor order. Spoke with primary RN, Christiane Ha, who states pt is not currently receiving this medication. RN agrees to notify IV team if medication is needed. Will hold on additional IV access for now. ?

## 2022-01-08 NOTE — Progress Notes (Addendum)
Inpatient Diabetes Program Recommendations ? ?AACE/ADA: New Consensus Statement on Inpatient Glycemic Control  ? ?Target Ranges:  Prepandial:   less than 140 mg/dL ?     Peak postprandial:   less than 180 mg/dL (1-2 hours) ?     Critically ill patients:  140 - 180 mg/dL  ? ? Latest Reference Range & Units 01/08/22 00:59 01/08/22 02:06 01/08/22 03:23 01/08/22 07:26  ?Glucose-Capillary 70 - 99 mg/dL 76 409 (H) 811 (H) 914 (H)  ? ? Latest Reference Range & Units 01/07/22 13:26  ?CO2 22 - 32 mmol/L 20 (L)  ?Glucose 70 - 99 mg/dL 782 (H)  ?Anion gap 5 - 15  18 (H)  ? ? Latest Reference Range & Units 01/08/22 04:24  ?Hemoglobin A1C 4.8 - 5.6 % 13.1 (H)  ? ?Review of Glycemic Control ? ?Diabetes history: DM2 ?Outpatient Diabetes medications: Levemir 12 units QHS, Novolog TID with meals, Metformin 1000 mg BID ?Current orders for Inpatient glycemic control: Novolog 0-9 units TID with meals, Novolog 0-5 units QHS, Levemir 12 units QAM ? ?Inpatient Diabetes Program Recommendations:   ? ?HbgA1C:  A1C 13.1% on 01/08/22 indicating an average glucose of 329 mg/dl over the past 2-3 months.  ? ?NOTE: Per H&P on 01/07/22, "Reports yesterday went to the bathroom and fell. Experienced right arm weakness and was unable to rise himself. Thinks he was on the floor for about 12 hours. His caregiver found him the following day." Admitted with DKA and rhabdomyolysis after fall at home and unable to get up. Patient was ordered IV insulin which has been transitioned to SQ insulin. Patient received Levemir 12 units at 4:06 am today.  Agree with current insulin orders. ? ?Addendum 01/08/22@13 :35-Spoke with patient over the phone about diabetes and home regimen for diabetes control. Patient reports being followed by PCP for diabetes management and currently taking Levemir 12 units QHS, Novolog TID with meals, and Metformin 1000 mg BID for diabetes control. Patient reports taking DM medications consistently (states he does not skip or miss doses).  Patient reports that his PCP has not made any changes with DM medications in a long time. Patient states that his PCP just mainly gives him refills on his DM medications and does not know what to do about his DM.  Patient initially reported his glucose was usually in the 140-150's mg/dl and in further discussing control he admits that it goes up to 300-400's mg/dl frequently.  Inquired about prior A1C and patient reports not being able to recall last A1C value. Discussed A1C results (13.1% on 01/08/22) and explained that current A1C indicates an average glucose of 329 mg/dl over the past 2-3 months. Discussed glucose and A1C goals. Discussed importance of checking CBGs and maintaining good CBG control to prevent long-term and short-term complications. Explained how hyperglycemia leads to damage within blood vessels which lead to the common complications seen with uncontrolled diabetes. Stressed to the patient the importance of improving glycemic control to prevent further complications from uncontrolled diabetes. Discussed impact of nutrition, exercise, stress, sickness, and medications on diabetes control.  Patient states he follows carb modified diet and "practically lives off greek yogurt" and drinks mainly diet koolaid and coffee.  Patient reports he is not able to do much exercise.  Encouraged patient to ask PCP about referring him to an Endocrinologist to get assistance with improving DM control. Patient states he has all needed DM medications and supplies at home.   Patient verbalized understanding of information discussed and reports no further  questions at this time related to diabetes. ? ? ?Thanks, ?Orlando Penner, RN, MSN, CDE ?Diabetes Coordinator ?Inpatient Diabetes Program ?[phone] (Team Pager from 8am to 5pm) ? ?

## 2022-01-08 NOTE — Evaluation (Signed)
Physical Therapy Evaluation ?Patient Details ?Name: Brandon [name] ?MRN: [id number] ?DOB: Aug 27, 1966 ?Today's Date: 01/08/2022 ? ?History of Present Illness ? Pt is a 56 y.o. male presenting to hospital 4/17 s/p fall (pt felt weak and fell landing on R side and unable to get up--R arm and side would "not work" d/t feeling weak and numb; symptoms improved so pt could try to crawl but unable to get up (on floor for about 12 hours before caregiver found him next day).  Imaging negative for CVA and fx.  Pt admitted with DKA, fall at home, rhabdomyolysis, and R arm weakness.  PMH includes h/o alcohol abuse, coronary disease, h/o CVA with residual R sided weakness, upper GI bleed, seizures, htn, and HLD.  ?Clinical Impression ? Prior to hospital admission, pt was modified independent ambulating with rollator; lives with his sister on main level of home with 5 STE B railings.  Currently pt is CGA with transfers and ambulation 120 feet with RW use.  Limited ambulation distance d/t R hip pain (pt also reporting 10/10 back pain post ambulation)--nurse notified of pt's request for pain meds.  Pt would benefit from skilled PT to address noted impairments and functional limitations (see below for any additional details).  Upon hospital discharge, pt would benefit from HHPT.   ? ?Recommendations for follow up therapy are one component of a multi-disciplinary discharge planning process, led by the attending physician.  Recommendations may be updated based on patient status, additional functional criteria and insurance authorization. ? ?Follow Up Recommendations Home health PT ? ?  ?Assistance Recommended at Discharge Intermittent Supervision/Assistance  ?Patient can return home with the following ? A little help with walking and/or transfers;A little help with bathing/dressing/bathroom;Assistance with cooking/housework;Assist for transportation;Help with stairs or ramp for entrance ? ?  ?Equipment Recommendations Rolling walker (2  wheels)  ?Recommendations for Other Services ? OT consult  ?  ?Functional Status Assessment Patient has had a recent decline in their functional status and demonstrates the ability to make significant improvements in function in a reasonable and predictable amount of time.  ? ?  ?Precautions / Restrictions Precautions ?Precautions: Fall ?Restrictions ?Weight Bearing Restrictions: No  ? ?  ? ?Mobility ? Bed Mobility ?  ?  ?  ?  ?  ?  ?  ?General bed mobility comments: Deferred (pt in recliner beginning/end of session) ?  ? ?Transfers ?Overall transfer level: Needs assistance ?Equipment used: Rolling walker (2 wheels) ?Transfers: Sit to/from Stand ?Sit to Stand: Min guard ?  ?  ?  ?  ?  ?General transfer comment: mild increased effort to stand up to RW; steady overall ?  ? ?Ambulation/Gait ?Ambulation/Gait assistance: Min guard ?Gait Distance (Feet): 120 Feet ?Assistive device: Rolling walker (2 wheels) ?  ?Gait velocity: decreased ?  ?  ?General Gait Details: antalgic; decreased stance time R LE; steady with RW use ? ?Stairs ?  ?  ?  ?  ?  ? ?Wheelchair Mobility ?  ? ?Modified Rankin (Stroke Patients Only) ?  ? ?  ? ?Balance Overall balance assessment: Needs assistance ?Sitting-balance support: No upper extremity supported, Feet supported ?Sitting balance-Leahy Scale: Good ?Sitting balance - Comments: steady sitting reaching within BOS ?  ?Standing balance support: Bilateral upper extremity supported, During functional activity, Reliant on assistive device for balance ?Standing balance-Leahy Scale: Good ?Standing balance comment: steady ambulating with RW use ?  ?  ?  ?  ?  ?  ?  ?  ?  ?  ?  ?   ? ? ? ?  Pertinent Vitals/Pain Pain Assessment ?Pain Assessment: 0-10 ?Pain Score: 10-Worst pain ever (10/10 back pain (R hip pain less but pt did not rate when asked)) ?Pain Location: back pain and R hip pain ?Pain Descriptors / Indicators: Aching, Sore, Tender ?Pain Intervention(s): Limited activity within patient's  tolerance, Monitored during session, Repositioned, Patient requesting pain meds-RN notified ?Vitals (HR and O2 on room air) stable and WFL throughout treatment session.  ? ? ?Home Living Family/patient expects to be discharged to:: Private residence ?Living Arrangements: Other relatives (pt's sister (who works)) ?Available Help at Discharge: Family;Available PRN/intermittently ?Type of Home: House ?Home Access: Stairs to enter ?Entrance Stairs-Rails: Right;Left;Can reach both ?Entrance Stairs-Number of Steps: 5 ?  ?Home Layout: Two level;Able to live on main level with bedroom/bathroom ?Home Equipment: Rollator (4 wheels);Rolling Walker (2 wheels) ?   ?  ?Prior Function Prior Level of Function : Needs assist;History of Falls (last six months) ?  ?  ?  ?  ?  ?  ?Mobility Comments: Ambulatory with 4ww at baseline (likes to be able to sit when needed) ?ADLs Comments: Has home health aid 3hrs/day M-F (assists with bathing and meals); sister leaves pt prepared meals; pt takes sponge baths usually (pt reports hard to get in/out of tub shower) ?  ? ? ?Hand Dominance  ?   ? ?  ?Extremity/Trunk Assessment  ? Upper Extremity Assessment ?Upper Extremity Assessment: Defer to OT evaluation ?  ? ?Lower Extremity Assessment ?Lower Extremity Assessment: RLE deficits/detail;LLE deficits/detail ?RLE Deficits / Details: hip flexion 4/5; knee flexion/extension 4/5; and DF 4/5 ?LLE Deficits / Details: hip flexion, knee flexion/extension, and DF at least 4+/5 ?  ? ?Cervical / Trunk Assessment ?Cervical / Trunk Assessment: Normal  ?Communication  ? Communication: No difficulties  ?Cognition Arousal/Alertness: Awake/alert ?Behavior During Therapy: [facility] for tasks assessed/performed ?Overall Cognitive Status: Within Functional Limits for tasks assessed ?  ?  ?  ?  ?  ?  ?  ?  ?  ?  ?  ?  ?  ?  ?  ?  ?  ?  ?  ? ?  ?General Comments  Nursing cleared pt for participation in physical therapy.  Pt agreeable to PT session. ? ?  ?Exercises     ? ?Assessment/Plan  ?  ?PT Assessment Patient needs continued PT services  ?PT Problem List Decreased strength;Decreased activity tolerance;Decreased balance;Decreased mobility;Decreased knowledge of precautions;Pain ? ?   ?  ?PT Treatment Interventions DME instruction;Gait training;Stair training;Functional mobility training;Therapeutic activities;Therapeutic exercise;Balance training;Patient/family education   ? ?PT Goals (Current goals can be found in the Care Plan section)  ?Acute Rehab PT Goals ?Patient Stated Goal: to improve pain and mobility ?PT Goal Formulation: With patient ?Time For Goal Achievement: 01/22/22 ?Potential to Achieve Goals: Good ? ?  ?Frequency Min 2X/week ?  ? ? ?Co-evaluation   ?  ?  ?  ?  ? ? ?  ?AM-PAC PT "6 Clicks" Mobility  ?Outcome Measure Help needed turning from your back to your side while in a flat bed without using bedrails?: None ?Help needed moving from lying on your back to sitting on the side of a flat bed without using bedrails?: None ?Help needed moving to and from a bed to a chair (including a wheelchair)?: A Little ?Help needed standing up from a chair using your arms (e.g., wheelchair or bedside chair)?: A Little ?Help needed to walk in hospital room?: A Little ?Help needed climbing 3-5 steps with a railing? : A Little ?6 Click  Score: 20 ? ?  ?End of Session Equipment Utilized During Treatment: Gait belt ?Activity Tolerance: Patient tolerated treatment well ?Patient left: in chair (with OT present for OT evaluation (OT to set pt up when finished)) ?Nurse Communication: Mobility status;Precautions;Patient requests pain meds ?PT Visit Diagnosis: Other abnormalities of gait and mobility (R26.89);History of falling (Z91.81);Muscle weakness (generalized) (M62.81) ?  ? ?Time: 1610-96041143-1200 ?PT Time Calculation (min) (ACUTE ONLY): 17 min ? ? ?Charges:   PT Evaluation ?$PT Eval Low Complexity: 1 Low ?  ?  ?   ? ?Hendricks LimesEmily Dorinda Stehr, PT ?01/08/22, 12:34 PM ? ? ?

## 2022-01-08 NOTE — Assessment & Plan Note (Signed)
Patient has had history of stroke.  MRI of the brain this admission does not show any acute stroke. ?

## 2022-01-09 DIAGNOSIS — M6282 Rhabdomyolysis: Secondary | ICD-10-CM

## 2022-01-09 DIAGNOSIS — I10 Essential (primary) hypertension: Secondary | ICD-10-CM

## 2022-01-09 DIAGNOSIS — E1165 Type 2 diabetes mellitus with hyperglycemia: Secondary | ICD-10-CM | POA: Diagnosis not present

## 2022-01-09 DIAGNOSIS — E1010 Type 1 diabetes mellitus with ketoacidosis without coma: Secondary | ICD-10-CM | POA: Diagnosis not present

## 2022-01-09 LAB — BASIC METABOLIC PANEL
Anion gap: 7 (ref 5–15)
BUN: 20 mg/dL (ref 6–20)
CO2: 25 mmol/L (ref 22–32)
Calcium: 8.9 mg/dL (ref 8.9–10.3)
Chloride: 100 mmol/L (ref 98–111)
Creatinine, Ser: 0.52 mg/dL — ABNORMAL LOW (ref 0.61–1.24)
GFR, Estimated: >60 mL/min (ref >60)
Glucose, Bld: 274 mg/dL — ABNORMAL HIGH (ref 70–99)
Potassium: 3.6 mmol/L (ref 3.5–5.1)
Sodium: 132 mmol/L — ABNORMAL LOW (ref 135–145)

## 2022-01-09 LAB — CBC
HCT: 36.8 % — ABNORMAL LOW (ref 39.0–52.0)
Hemoglobin: 12.2 g/dL — ABNORMAL LOW (ref 13.0–17.0)
MCH: 30.6 pg (ref 26.0–34.0)
MCHC: 33.2 g/dL (ref 30.0–36.0)
MCV: 92.2 fL (ref 80.0–100.0)
Platelets: 270 10*3/uL (ref 150–400)
RBC: 3.99 MIL/uL — ABNORMAL LOW (ref 4.22–5.81)
RDW: 13.5 % (ref 11.5–15.5)
WBC: 10 10*3/uL (ref 4.0–10.5)
nRBC: 0 % (ref 0.0–0.2)

## 2022-01-09 LAB — GLUCOSE, CAPILLARY
Glucose-Capillary: 316 mg/dL — ABNORMAL HIGH (ref 70–99)
Glucose-Capillary: 273 mg/dL — ABNORMAL HIGH (ref 70–99)
Glucose-Capillary: 386 mg/dL — ABNORMAL HIGH (ref 70–99)
Glucose-Capillary: 343 mg/dL — ABNORMAL HIGH (ref 70–99)

## 2022-01-09 LAB — CK: Total CK: 450 U/L — ABNORMAL HIGH (ref 49–397)

## 2022-01-09 MED ORDER — ENSURE MAX PROTEIN PO LIQD: 11.0000 [oz_av] | Freq: Every day | ORAL | Status: DC

## 2022-01-09 MED ORDER — INSULIN DETEMIR 100 UNIT/ML FLEXPEN
25.0000 [IU] | PEN_INJECTOR | Freq: Every day | SUBCUTANEOUS | 11 refills | Status: DC
Start: 2022-01-09 — End: 2022-01-09

## 2022-01-09 MED ORDER — GLUCERNA SHAKE PO LIQD: 237.0000 mL | Freq: Two times a day (BID) | ORAL | 0 refills | Status: DC

## 2022-01-09 MED ORDER — LISINOPRIL 10 MG PO TABS
10.0000 mg | ORAL_TABLET | Freq: Every day | ORAL | Status: DC
  Administered 2022-01-09: 10 mg via ORAL
  Filled 2022-01-09: qty 1

## 2022-01-09 MED ORDER — INSULIN DETEMIR 100 UNIT/ML FLEXPEN: 25.0000 [IU] | PEN_INJECTOR | Freq: Every day | SUBCUTANEOUS | 11 refills | Status: DC

## 2022-01-09 MED ORDER — HYDRALAZINE HCL 20 MG/ML IJ SOLN
10.0000 mg | Freq: Four times a day (QID) | INTRAMUSCULAR | Status: DC | PRN
  Administered 2022-01-09: 10 mg via INTRAVENOUS
  Filled 2022-01-09: qty 1

## 2022-01-09 NOTE — Progress Notes (Signed)
Occupational Therapy Treatment ?Patient Details ?Name: Brandon [name] ?MRN: [id number] ?DOB: 02-27-1966 ?Today's Date: 01/09/2022 ? ? ?History of present illness Pt is a 56 y.o. male presenting to hospital 4/17 s/p fall (pt felt weak and fell landing on R side and unable to get up--R arm and side would "not work" d/t feeling weak and numb; symptoms improved so pt could try to crawl but unable to get up (on floor for about 12 hours before caregiver found him next day).  Imaging negative for CVA and fx.  Pt admitted with DKA, fall at home, rhabdomyolysis, and R arm weakness.  PMH includes h/o alcohol abuse, coronary disease, h/o CVA with residual R sided weakness, upper GI bleed, seizures, htn, and HLD. ?  ?OT comments ? Pt seen for OT treatment on this date. Upon arrival to room, pt awake and seated upright in bed. Pt reporting 8/10 headache however agreeable to OT tx. Pt currently presents with decreased strength and activity tolerance. Due to these functional impairments, pt requires SUPERVISION for toilet transfers, SUPERVISION for standing grooming tasks, and MIN GUARD for functional mobility of household distances (172ft) with RW. Pt is making good progress toward goals. Pt continues to benefit from skilled OT services to maximize return to PLOF and minimize risk of future falls, injury, caregiver burden, and readmission. Will continue to follow POC. Discharge recommendation remains appropriate.    ? ?Recommendations for follow up therapy are one component of a multi-disciplinary discharge planning process, led by the attending physician.  Recommendations may be updated based on patient status, additional functional criteria and insurance authorization. ?   ?Follow Up Recommendations ? Home health OT  ?  ?Assistance Recommended at Discharge Intermittent Supervision/Assistance  ?Patient can return home with the following ? A little help with walking and/or transfers;A little help with  bathing/dressing/bathroom;Assistance with cooking/housework ?  ?Equipment Recommendations ? None recommended by OT  ?  ?   ?Precautions / Restrictions Precautions ?Precautions: Fall ?Restrictions ?Weight Bearing Restrictions: No  ? ? ?  ? ?Mobility Bed Mobility ?Overal bed mobility: Needs Assistance ?Bed Mobility: Supine to Sit ?  ?  ?Supine to sit: Supervision ?  ?  ?  ?  ? ?Transfers ?Overall transfer level: Needs assistance ?Equipment used: Rolling walker (2 wheels) ?Transfers: Sit to/from Stand ?Sit to Stand: Supervision ?  ?  ?  ?  ?  ?  ?  ?  ?Balance Overall balance assessment: Needs assistance ?Sitting-balance support: No upper extremity supported, Feet supported ?Sitting balance-Leahy Scale: Good ?Sitting balance - Comments: steady sitting reaching within BOS ?  ?Standing balance support: During functional activity, No upper extremity supported ?Standing balance-Leahy Scale: Good ?Standing balance comment: requires only supervision for standing grooming tasks ?  ?  ?  ?  ?  ?  ?  ?  ?  ?  ?  ?   ? ?ADL either performed or assessed with clinical judgement  ? ?ADL Overall ADL's : Needs assistance/impaired ?  ?  ?Grooming: Wash/dry hands;Supervision/safety;Standing;Oral care;Set up;Sitting ?  ?  ?  ?  ?  ?  ?  ?  ?  ?Toilet Transfer: Supervision/safety;Ambulation;BSC/3in1;Rolling walker (2 wheels) (BSC frame over toilet) ?  ?  ?  ?  ?  ?Functional mobility during ADLs: Min guard;Rolling walker (2 wheels) ?  ?  ? ? ? ?Cognition Arousal/Alertness: Awake/alert ?Behavior During Therapy: [facility] for tasks assessed/performed, Flat affect ?Overall Cognitive Status: Within Functional Limits for tasks assessed ?  ?  ?  ?  ?  ?  ?  ?  ?  ?  ?  ?  ?  ?  ?  ?  ?  ?  ?  ?   ?   ?   ?   ? ? ?  Pertinent Vitals/ Pain       Pain Assessment ?Pain Assessment: 0-10 ?Pain Score: 8  ?Pain Location: head ?Pain Descriptors / Indicators: Aching ?Pain Intervention(s): Limited activity within patient's tolerance, Monitored during session,  Premedicated before session ? ?   ?   ? ?Frequency ? Min 2X/week  ? ? ? ? ?  ?Progress Toward Goals ? ?OT Goals(current goals can now be found in the care plan section) ? Progress towards OT goals: Progressing toward goals ? ?Acute Rehab OT Goals ?Patient Stated Goal: to return home ?OT Goal Formulation: With patient ?Time For Goal Achievement: 01/22/22 ?Potential to Achieve Goals: Good  ?Plan Discharge plan remains appropriate;Frequency remains appropriate   ? ?   ?AM-PAC OT "6 Clicks" Daily Activity     ?Outcome Measure ? ? Help from another person eating meals?: None ?Help from another person taking care of personal grooming?: A Little ?Help from another person toileting, which includes using toliet, bedpan, or urinal?: A Little ?Help from another person bathing (including washing, rinsing, drying)?: A Little ?Help from another person to put on and taking off regular upper body clothing?: A Little ?Help from another person to put on and taking off regular lower body clothing?: A Lot ?6 Click Score: 18 ? ?  ?End of Session Equipment Utilized During Treatment: Rolling walker (2 wheels) ? ?OT Visit Diagnosis: Unsteadiness on feet (R26.81);History of falling (Z91.81);Muscle weakness (generalized) (M62.81);Pain ?Pain - part of body:  (head) ?  ?Activity Tolerance Patient tolerated treatment well ?  ?Patient Left in chair;with call bell/phone within reach ?  ?Nurse Communication Mobility status ?  ? ?   ? ?Time: 4008-6761 ?OT Time Calculation (min): 27 min ? ?Charges: OT General Charges ?$OT Visit: 1 Visit ?OT Treatments ?$Self Care/Home Management : 23-37 mins ? ?Matthew Folks, OTR/L ?ASCOM [phone] ? ?

## 2022-01-09 NOTE — Assessment & Plan Note (Signed)
Uncontrolled with A1c at 13.  Seen by diabetes coordinator who recommended increasing patient's daily Levemir from 12units to 30 units daily.  Changed to 25 since he also will be resuming his metformin. ?

## 2022-01-09 NOTE — Progress Notes (Signed)
Pt and family received discharge teaching and summary; they verbalized understanding.  Pt escorted out of building via wheelchair by this nurse and assisted into car to be transported home by sister.  Pt and family verbalized being satisfied with care.  ?

## 2022-01-09 NOTE — Discharge Summary (Addendum)
Physician Discharge Summary   Patient: Brandon [name] MRN: [id number] DOB: [birth year]  Admit date:     01/07/2022  Discharge date: 01/09/22  Discharge Physician: Hollice Espy   PCP: Armando Gang, FNP   Recommendations at discharge:   Medication change: Levemir increased from 12 units daily to 25 units subcu daily Because of patient's Medicaid, case manager unable to find home health agency to take him.  He declined outpatient physical therapy.  Also given resources for food as he has food insecurity issues.  Discharge Diagnoses: Principal Problem:   DKA (diabetic ketoacidosis) (HCC) Active Problems:   Bilateral extracranial carotid artery stenosis   Hyperglycemia due to type 2 diabetes mellitus (HCC)   Hypertension   GERD (gastroesophageal reflux disease)   Coronary Artery Disease   Smoking   Alcohol Dependence   Rhabdomyolysis   Pressure injury of skin   Frequent falls  Resolved Problems:   * No resolved hospital problems. *  Hospital Course: 56 y.o. male with past medical history of previous alcohol abuse, history of CVA with residual right-sided weakness, type 2 diabetes mellitus, hypertension and seizures who presented to the emergency room on 4/17 after a fall the previous day and was on the ground for about 12 hours, being so weak, he was unable to get up.  In the emergency room, patient found to be in DKA with a CBG of 500 as well as an rhabdomyolysis with CK level of 716.  Patient was started on IV fluids and insulin drip which was able to be weaned off by 4/17.  Patient was still noted to be quite weak and continued received IV fluids.  By 4/19, CK level down to 400s and he was able to ambulate better.  Felt to be okay to discharge home.   Assessment and Plan: * DKA (diabetic ketoacidosis) (HCC) Patient treated with IV fluids and insulin drip.  By following day, DKA had resolved.  Ischemic Stroke Patient has had history of stroke.  MRI of the brain this  admission does not show any acute stroke.  Hyperglycemia due to type 2 diabetes mellitus (HCC) Uncontrolled with A1c at 13.  Seen by diabetes coordinator who recommended increasing patient's daily Levemir from 12units to 30 units daily.  Changed to 25 since he also will be resuming his metformin.  Frequent falls Multifactorial.  We will get PT, OT evaluation  Rhabdomyolysis Monitor CK with hydration this is likely due to fall and muscle injury CK 716 on admission  Alcohol Dependence Counseled for cessation he has failed at least 3 times in the past.  Coronary Artery Disease Stable during his hospitalization.  Hypertension Initially slightly hypotensive due to dehydration from DKA.   Pressure Injury 01/07/22 Coccyx Medial Stage 2 -  Partial thickness loss of dermis presenting as a shallow open injury with a red, pink wound bed without slough. discolored (Active)  01/07/22 2045  Location: Coccyx  Location Orientation: Medial  Staging: Stage 2 -  Partial thickness loss of dermis presenting as a shallow open injury with a red, pink wound bed without slough.  Wound Description (Comments): discolored  Present on Admission: Yes   Patient with stage II of coccyx pressure ulcer, present on admission.  Severe protein calorie malnutrition: Nutrition Status: Nutrition Problem: Severe Malnutrition Etiology: social / environmental circumstances Signs/Symptoms: severe fat depletion, severe muscle depletion Interventions: Glucerna shake, Premier Protein, MVI, Liberalize Diet        Consultants: None Procedures performed: None Disposition: Home Diet recommendation:  Discharge Diet Orders (From admission, onward)     Start     Ordered   01/09/22 0000  Diet - low sodium heart healthy        01/09/22 1414           Carb modified diet DISCHARGE MEDICATION: Allergies as of 01/09/2022       Reactions   No Known Allergies         Medication List     TAKE these  medications    ascorbic acid 500 MG tablet Commonly known as: VITAMIN C Take 500 mg by mouth daily.   Aspirin Low Dose 81 MG EC tablet Generic drug: aspirin Take 1 tablet (81 mg total) by mouth daily.   busPIRone 10 MG tablet Commonly known as: BUSPAR Take 1 tablet (10 mg total) by mouth 2 (two) times daily.   clopidogrel 75 MG tablet Commonly known as: PLAVIX Take 1 tablet (75 mg total) by mouth daily.   famotidine 20 MG tablet Commonly known as: PEPCID Take 1 tablet (20 mg total) by mouth 2 (two) times daily.   feeding supplement (GLUCERNA SHAKE) Liqd Take 237 mLs by mouth 2 (two) times daily between meals.   Ensure Max Protein Liqd Take 330 mLs (11 oz total) by mouth at bedtime.   FeroSul 325 (65 FE) MG tablet Generic drug: ferrous sulfate Take 1 tablet (325 mg total) by mouth daily.   folic acid 1 MG tablet Commonly known as: FOLVITE Take 1 tablet (1 mg total) by mouth daily.   insulin aspart 100 UNIT/ML injection Commonly known as: novoLOG Inject 4 Units into the skin 3 (three) times daily before meals. Sliding scale   insulin detemir 100 UNIT/ML FlexPen Commonly known as: LEVEMIR Inject 25 Units into the skin at bedtime. What changed: how much to take   lisinopril 10 MG tablet Commonly known as: ZESTRIL Take 1 tablet (10 mg total) by mouth daily.   metFORMIN 1000 MG tablet Commonly known as: GLUCOPHAGE Take 1 tablet (1,000 mg total) by mouth 2 (two) times daily with a meal.   metoprolol tartrate 100 MG tablet Commonly known as: LOPRESSOR Take 100 mg by mouth daily.   multivitamin with minerals Tabs tablet Take 1 tablet by mouth daily.   nicotine 14 mg/24hr patch Commonly known as: NICODERM CQ - dosed in mg/24 hours Place 1 patch (14 mg total) onto the skin daily. What changed: additional instructions   Pen Needles 31G X 5 MM Misc 1 each by Does not apply route daily.   simvastatin 20 MG tablet Commonly known as: ZOCOR Take 1 tablet (20 mg  total) by mouth daily at 6 PM.   traZODone 150 MG tablet Commonly known as: DESYREL Take 1 tablet (150 mg total) by mouth at bedtime.        Discharge Exam: Filed Weights   01/07/22 1319 01/07/22 2040  Weight: 61.2 kg 59.2 kg   General: Alert and oriented x3, no acute distress Cardiovascular: Regular rate and rhythm, S1-S2 Lungs: Clear to auscultation bilaterally  Condition at discharge: good  The results of significant diagnostics from this hospitalization (including imaging, microbiology, ancillary and laboratory) are listed below for reference.   Imaging Studies: DG Chest 1 View  Result Date: 01/07/2022 CLINICAL DATA:  Dizziness, fall yesterday EXAM: CHEST  1 VIEW COMPARISON:  Chest radiograph 02/06/2020 FINDINGS: The cardiomediastinal silhouette is normal. There is asymmetric elevation of the right hemidiaphragm, similar to the prior study. There is no focal consolidation or pulmonary  edema. There is no pleural effusion or pneumothorax. Lucency projecting over the left lateral fourth and fifth ribs is favored to be artifactual. There is no acute osseous abnormality. IMPRESSION: No radiographic evidence of acute cardiopulmonary process. Electronically Signed   By: Lesia Hausen M.D.   On: 01/07/2022 14:49   CT HEAD WO CONTRAST ( )  Result Date: 01/07/2022 CLINICAL DATA:  Dizziness, fall yesterday EXAM: CT HEAD WITHOUT CONTRAST TECHNIQUE: Contiguous axial images were obtained from the base of the skull through the vertex without intravenous contrast. RADIATION DOSE REDUCTION: This exam was performed according to the departmental dose-optimization program which includes automated exposure control, adjustment of the mA and/or kV according to patient size and/or use of iterative reconstruction technique. COMPARISON:  Brain MRI 03/06/2016, CT head 03/05/2016 FINDINGS: Brain: There is no acute intracranial hemorrhage, extra-axial fluid collection, or acute infarct. There is global  parenchymal volume loss with prominence of the ventricular system and extra-axial CSF spaces, advanced for age. The dilated ventricles are unchanged in caliber since 2017. There is encephalomalacia and gliosis in the right frontal lobe in the MCA distribution consistent with remote infarct. Remote infarct is also seen in the right caudate head. There is no solid mass lesion. There is no mass effect or midline shift. Vascular: There is calcification of the bilateral cavernous ICAs. Skull: Normal. Negative for fracture or focal lesion. Sinuses/Orbits: The paranasal sinuses are clear. The globes and orbits are unremarkable. Other: None. IMPRESSION: 1. No acute intracranial pathology. 2. Remote infarcts in the right frontal lobe on a background of global parenchymal volume loss. The dilated ventricular system is similar in caliber compared to the study from 2017. Electronically Signed   By: Lesia Hausen M.D.   On: 01/07/2022 15:15   CT Cervical Spine Wo Contrast  Result Date: 01/07/2022 CLINICAL DATA:  Dizziness yesterday, fall EXAM: CT CERVICAL SPINE WITHOUT CONTRAST TECHNIQUE: Multidetector CT imaging of the cervical spine was performed without intravenous contrast. Multiplanar CT image reconstructions were also generated. RADIATION DOSE REDUCTION: This exam was performed according to the departmental dose-optimization program which includes automated exposure control, adjustment of the mA and/or kV according to patient size and/or use of iterative reconstruction technique. COMPARISON:  Thoracic spine MRI 02/06/2020, cervical spine MRI 11/04/2013 FINDINGS: Alignment: There is trace retrolisthesis of C3 on C4 and C4 on C5, likely degenerative in nature and similar to the prior MRI. There is no jumped or perched facets or other evidence of traumatic malalignment. Skull base and vertebrae: Skull base alignment is maintained. There is mild anterior compression deformity of the T1 vertebral body, similar to the prior  MRI from 2015. The other vertebral body heights are preserved. There is no evidence of acute fracture. There is no suspicious osseous lesion. Soft tissues and spinal canal: No prevertebral fluid or swelling. No visible canal hematoma. Disc levels: There is multilevel disc space narrowing with associated uncovertebral arthropathy and bulky anterior osteophytes. There is multilevel facet arthropathy throughout the cervical spine. There is thickening of the posterior longitudinal ligament most notable at C3-C4. Findings result in up to moderate to severe spinal canal stenosis and severe right and moderate left neural foraminal stenosis at C3-C4. Upper chest: The imaged lung apices are clear. Other: Bulky atherosclerotic plaque of the carotid bifurcations is noted with a left carotid stent. IMPRESSION: 1. No acute fracture or traumatic malalignment of the cervical spine. 2. Multilevel degenerative changes throughout the cervical spine, most advanced at C3-C4 where there is moderate to severe spinal canal stenosis  and severe right neural foraminal stenosis. Electronically Signed   By: Lesia Hausen M.D.   On: 01/07/2022 15:21   MR BRAIN WO CONTRAST  Result Date: 01/07/2022 CLINICAL DATA:  Dizziness and fall yesterday EXAM: MRI HEAD WITHOUT CONTRAST TECHNIQUE: Multiplanar, multiecho pulse sequences of the brain and surrounding structures were obtained without intravenous contrast. COMPARISON:  Same-day CT head, MR head 03/06/2016 FINDINGS: Brain: There is no evidence of acute intracranial hemorrhage, extra-axial fluid collection, or acute infarct. There is global parenchymal volume loss with significant dilation of the ventricular system, similar to the prior study from 2017. Again seen are remote infarcts in the right frontal lobe with ex vacuo dilatation of the right lateral ventricle. Additional patchy foci of FLAIR signal abnormality in the subcortical and periventricular white matter likely reflects sequela of  background chronic white matter microangiopathy. There is no solid mass lesion. There is no mass effect or midline shift. Vascular: The right ICA flow void is absent through the communicating segment. The right MCA flow void is present. This is unchanged and is consistent with chronic occlusion of the right ICA as seen on prior vascular imaging. Skull and upper cervical spine: Normal marrow signal. Sinuses/Orbits: The paranasal sinuses are clear. The globes and orbits are unremarkable. Other: None. IMPRESSION: 1. No acute intracranial pathology. 2. Remote infarcts in the right frontal lobe and global parenchymal volume loss with dilation of the ventricular system. The ventricular system is similar in caliber compared to prior studies. Electronically Signed   By: Lesia Hausen M.D.   On: 01/07/2022 17:53   DG Hip Unilat  With Pelvis 2-3 Views Right  Result Date: 01/07/2022 CLINICAL DATA:  Fall, right hip pain EXAM: DG HIP (WITH OR WITHOUT PELVIS) 2-3V RIGHT COMPARISON:  None. FINDINGS: There is no evidence of hip fracture or dislocation. Decreased offset of the femoral head-neck junction. Mild joint space narrowing. Atherosclerotic vascular calcifications. IMPRESSION: Negative. Electronically Signed   By: Duanne Guess D.O.   On: 01/07/2022 14:44    Microbiology: Results for orders placed or performed during the hospital encounter of 01/07/22  MRSA Next Gen by PCR, Nasal     Status: None   Collection Time: 01/07/22  8:37 PM   Specimen: Nasal Mucosa; Nasal Swab  Result Value Ref Range Status   MRSA by PCR Next Gen NOT DETECTED NOT DETECTED Final    Comment: (NOTE) The GeneXpert MRSA Assay (FDA approved for NASAL specimens only), is one component of a comprehensive MRSA colonization surveillance program. It is not intended to diagnose MRSA infection nor to guide or monitor treatment for MRSA infections. Test performance is not FDA approved in patients less than 27 years old. Performed at [facility], [address] [place], [place] [address]     Labs: CBC: Recent Labs  Lab 01/07/22 1326 01/09/22 0422  WBC 17.1* 10.0  NEUTROABS 15.0*  --   HGB 13.7 12.2*  HCT 42.7 36.8*  MCV 95.5 92.2  PLT 341 270   Basic Metabolic Panel: Recent Labs  Lab 01/07/22 2106 01/07/22 2358 01/08/22 0424 01/08/22 0729 01/09/22 0422  NA 135 135 134* 134* 132*  K 4.1 3.9 3.8 3.6 3.6  CL 102 104 104 103 100  CO2 22 22 22 25 25   GLUCOSE 185* 98 210* 166* 274*  BUN 24* 24* 24* 23* 20  CREATININE 0.57* 0.64 0.58* 0.54* 0.52*  CALCIUM 9.2 8.9 8.6* 8.7* 8.9   Liver Function Tests: Recent Labs  Lab 01/07/22 1326  AST 44*  ALT 28  ALKPHOS 111  BILITOT 0.8  PROT 8.0  ALBUMIN 4.0   CBG: Recent Labs  Lab 01/08/22 1620 01/08/22 2108 01/09/22 0629 01/09/22 0801 01/09/22 1137  GLUCAP 303* 384* 316* 273* 386*    Discharge time spent: less than 30 minutes.  Signed: Hollice Espy, MD [facility] 01/09/2022

## 2022-01-09 NOTE — Assessment & Plan Note (Signed)
Stable during his hospitalization. ?

## 2022-01-09 NOTE — Plan of Care (Signed)

## 2022-01-09 NOTE — Progress Notes (Signed)
? ?      CROSS COVER NOTE ? ?NAME: RHONIN TROTT ?MRN: [id number] ?DOB : Nov 08, 1965 ? ? ? ?Date of Service ?  01/09/2022  ?HPI/Events of Note ?  BP 194/110. Home Lisinopril and Metoprolol were held due to soft pressures yesterday.  ?Interventions ?  Plan: ?Restarted home lisinopril ? ?   ?  ?Bishop Limbo MHA, MSN, FNP-BC ?Nurse Practitioner ?[facility] ?[place] ?Pager [phone] ? ?

## 2022-01-09 NOTE — Assessment & Plan Note (Signed)
Patient treated with IV fluids and insulin drip.  By following day, DKA had resolved. ?

## 2022-01-09 NOTE — Assessment & Plan Note (Signed)
Initially slightly hypotensive due to dehydration from DKA. ?

## 2022-01-09 NOTE — Progress Notes (Signed)
Inpatient Diabetes Program Recommendations ? ?AACE/ADA: New Consensus Statement on Inpatient Glycemic Control  ?Target Ranges:  Prepandial:   less than 140 mg/dL ?     Peak postprandial:   less than 180 mg/dL (1-2 hours) ?     Critically ill patients:  140 - 180 mg/dL  ? ? Latest Reference Range & Units 01/08/22 07:26 01/08/22 11:26 01/08/22 16:20 01/08/22 21:08 01/09/22 06:29 01/09/22 08:01  ?Glucose-Capillary 70 - 99 mg/dL 767 (H) 209 (H) 470 (H) 384 (H) 316 (H) 273 (H)  ? ?Review of Glycemic Control ? ?Diabetes history: DM2 ?Outpatient Diabetes medications: Levemir 12 units QHS, Novolog TID with meals, Metformin 1000 mg BID ?Current orders for Inpatient glycemic control: Novolog 0-9 units TID with meals, Novolog 0-5 units QHS, Levemir 12 units QAM ?  ?Inpatient Diabetes Program Recommendations:   ?  ?Insulin: Please consider increasing Levemir to 20 units QAM. If agreeable, please also order one time Levemir 8 units x1 now (for total of 20 units today). Also consider ordering Novolog 5 units TID with meals for meal coverage if patient eats at least 50% of meals. ? ?HbgA1C:  A1C 13.1% on 01/08/22 indicating an average glucose of 329 mg/dl over the past 2-3 months.  ?  ?Thanks, ?Orlando Penner, RN, MSN, CDE ?Diabetes Coordinator ?Inpatient Diabetes Program ?[phone] (Team Pager from 8am to 5pm) ? ? ? ?

## 2022-01-09 NOTE — Progress Notes (Signed)
Met with the patient at the bedside ?He lives at home with his sister ?He said she works during the day ?He has a CNA come daily to check on him but cannot remember the name of the agency ?I explained that I was not able to find a [place] agency to accept his medicaid and asked if he would be interested in Outpatient PT, he said he didn't need it ?I explained that with medicaid they have transportation that he can use but would need to call the day before to set up, I provided him with the phone number ?I asked him about food insecurity and he said that sometimes he has trouble, I provided him with the phone number form [employer] and explained that they would deliver free meals to him weekly similar to Meals on wheels, He asked that I put the numbers in his bag of personal items, I placed the numbers in the bag with his clothing also provided the [employer] ?He has a rolling walker and a Rollator at home and does not need additional DME, he was able to walk 120 ft with PT ?No Additional Needs at this time ? ?

## 2022-01-10 ENCOUNTER — Telehealth: Payer: Self-pay

## 2022-01-10 NOTE — Telephone Encounter (Signed)
Transition Care Management Unsuccessful Follow-up Telephone Call ? ?Date of discharge and from where:  01/09/2022 from [facility] ? ?Attempts:  1st Attempt ? ?Reason for unsuccessful TCM follow-up call:  Unable to leave message ? ? ? ?

## 2022-01-11 DIAGNOSIS — J449 Chronic obstructive pulmonary disease, unspecified: Secondary | ICD-10-CM | POA: Diagnosis not present

## 2022-01-13 ENCOUNTER — Inpatient Hospital Stay (HOSPITAL_COMMUNITY)
Admission: EM | Admit: 2022-01-13 | Discharge: 2022-01-17 | DRG: 637 | Disposition: A | Discharge status: Skilled Nursing Facility | Payer: Medicaid Other | Attending: Internal Medicine | Admitting: Internal Medicine

## 2022-01-13 DIAGNOSIS — L89892 Pressure ulcer of other site, stage 2: Secondary | ICD-10-CM | POA: Diagnosis present

## 2022-01-13 DIAGNOSIS — E43 Unspecified severe protein-calorie malnutrition: Secondary | ICD-10-CM | POA: Diagnosis present

## 2022-01-13 DIAGNOSIS — E1165 Type 2 diabetes mellitus with hyperglycemia: Secondary | ICD-10-CM

## 2022-01-13 DIAGNOSIS — E785 Hyperlipidemia, unspecified: Secondary | ICD-10-CM | POA: Diagnosis present

## 2022-01-13 DIAGNOSIS — Z794 Long term (current) use of insulin: Secondary | ICD-10-CM

## 2022-01-13 DIAGNOSIS — Z7902 Long term (current) use of antithrombotics/antiplatelets: Secondary | ICD-10-CM

## 2022-01-13 DIAGNOSIS — R296 Repeated falls: Secondary | ICD-10-CM

## 2022-01-13 DIAGNOSIS — Z9181 History of falling: Secondary | ICD-10-CM

## 2022-01-13 DIAGNOSIS — Z7984 Long term (current) use of oral hypoglycemic drugs: Secondary | ICD-10-CM

## 2022-01-13 DIAGNOSIS — E1110 Type 2 diabetes mellitus with ketoacidosis without coma: Principal | ICD-10-CM | POA: Diagnosis present

## 2022-01-13 DIAGNOSIS — E11649 Type 2 diabetes mellitus with hypoglycemia without coma: Secondary | ICD-10-CM | POA: Diagnosis not present

## 2022-01-13 DIAGNOSIS — R64 Cachexia: Secondary | ICD-10-CM | POA: Diagnosis present

## 2022-01-13 DIAGNOSIS — T796XXA Traumatic ischemia of muscle, initial encounter: Secondary | ICD-10-CM | POA: Diagnosis present

## 2022-01-13 DIAGNOSIS — I10 Essential (primary) hypertension: Secondary | ICD-10-CM | POA: Diagnosis present

## 2022-01-13 DIAGNOSIS — Z7982 Long term (current) use of aspirin: Secondary | ICD-10-CM

## 2022-01-13 DIAGNOSIS — W19XXXA Unspecified fall, initial encounter: Secondary | ICD-10-CM | POA: Diagnosis not present

## 2022-01-13 DIAGNOSIS — Y92008 Other place in unspecified non-institutional (private) residence as the place of occurrence of the external cause: Secondary | ICD-10-CM

## 2022-01-13 DIAGNOSIS — W1830XA Fall on same level, unspecified, initial encounter: Secondary | ICD-10-CM | POA: Diagnosis present

## 2022-01-13 DIAGNOSIS — F1021 Alcohol dependence, in remission: Secondary | ICD-10-CM | POA: Diagnosis present

## 2022-01-13 DIAGNOSIS — E860 Dehydration: Secondary | ICD-10-CM | POA: Diagnosis present

## 2022-01-13 DIAGNOSIS — R54 Age-related physical debility: Secondary | ICD-10-CM | POA: Diagnosis present

## 2022-01-13 DIAGNOSIS — Z811 Family history of alcohol abuse and dependence: Secondary | ICD-10-CM

## 2022-01-13 DIAGNOSIS — L89890 Pressure ulcer of other site, unstageable: Secondary | ICD-10-CM | POA: Diagnosis present

## 2022-01-13 DIAGNOSIS — R739 Hyperglycemia, unspecified: Secondary | ICD-10-CM | POA: Diagnosis not present

## 2022-01-13 DIAGNOSIS — Z91199 Patient's noncompliance with other medical treatment and regimen due to unspecified reason: Secondary | ICD-10-CM

## 2022-01-13 DIAGNOSIS — T07XXXA Unspecified multiple injuries, initial encounter: Secondary | ICD-10-CM | POA: Diagnosis not present

## 2022-01-13 DIAGNOSIS — Z833 Family history of diabetes mellitus: Secondary | ICD-10-CM

## 2022-01-13 DIAGNOSIS — Z823 Family history of stroke: Secondary | ICD-10-CM

## 2022-01-13 DIAGNOSIS — Z8249 Family history of ischemic heart disease and other diseases of the circulatory system: Secondary | ICD-10-CM

## 2022-01-13 DIAGNOSIS — Z841 Family history of disorders of kidney and ureter: Secondary | ICD-10-CM

## 2022-01-13 DIAGNOSIS — K219 Gastro-esophageal reflux disease without esophagitis: Secondary | ICD-10-CM | POA: Diagnosis present

## 2022-01-13 DIAGNOSIS — D72829 Elevated white blood cell count, unspecified: Secondary | ICD-10-CM | POA: Diagnosis present

## 2022-01-13 DIAGNOSIS — R809 Proteinuria, unspecified: Secondary | ICD-10-CM | POA: Diagnosis present

## 2022-01-13 DIAGNOSIS — F17210 Nicotine dependence, cigarettes, uncomplicated: Secondary | ICD-10-CM | POA: Diagnosis present

## 2022-01-13 DIAGNOSIS — Z79899 Other long term (current) drug therapy: Secondary | ICD-10-CM

## 2022-01-13 DIAGNOSIS — M549 Dorsalgia, unspecified: Secondary | ICD-10-CM | POA: Diagnosis not present

## 2022-01-13 DIAGNOSIS — T1490XA Injury, unspecified, initial encounter: Secondary | ICD-10-CM | POA: Diagnosis not present

## 2022-01-13 DIAGNOSIS — I2510 Atherosclerotic heart disease of native coronary artery without angina pectoris: Secondary | ICD-10-CM | POA: Diagnosis present

## 2022-01-13 DIAGNOSIS — L89212 Pressure ulcer of right hip, stage 2: Secondary | ICD-10-CM | POA: Diagnosis present

## 2022-01-13 DIAGNOSIS — I6930 Unspecified sequelae of cerebral infarction: Secondary | ICD-10-CM

## 2022-01-13 DIAGNOSIS — F32A Depression, unspecified: Secondary | ICD-10-CM | POA: Diagnosis present

## 2022-01-13 DIAGNOSIS — R6510 Systemic inflammatory response syndrome (SIRS) of non-infectious origin without acute organ dysfunction: Secondary | ICD-10-CM | POA: Diagnosis present

## 2022-01-13 DIAGNOSIS — M6282 Rhabdomyolysis: Secondary | ICD-10-CM | POA: Diagnosis present

## 2022-01-13 DIAGNOSIS — F419 Anxiety disorder, unspecified: Secondary | ICD-10-CM | POA: Diagnosis present

## 2022-01-13 DIAGNOSIS — M25512 Pain in left shoulder: Secondary | ICD-10-CM | POA: Diagnosis present

## 2022-01-13 DIAGNOSIS — N179 Acute kidney failure, unspecified: Secondary | ICD-10-CM | POA: Diagnosis present

## 2022-01-13 DIAGNOSIS — Z681 Body mass index (BMI) 19 or less, adult: Secondary | ICD-10-CM

## 2022-01-13 DIAGNOSIS — E876 Hypokalemia: Secondary | ICD-10-CM | POA: Diagnosis present

## 2022-01-13 NOTE — ED Triage Notes (Signed)
Pt bib [facility] from home for fall. Was recently seen at [facility] for a fall and has been experiencing bilateral back and hip pain. Pt endorses falling twice at home today. Most recent fall impacted his left side, now c/o left shoulder pain, bilateral hip pain, and bilateral back pain. VSS with EMS, BP 142/90; HR 100; 100% room air; CBG 318. Recently diagnosed with diabetes A&O x4.  ?

## 2022-01-14 ENCOUNTER — Other Ambulatory Visit: Payer: Self-pay

## 2022-01-14 ENCOUNTER — Emergency Department (HOSPITAL_COMMUNITY): Payer: Medicaid Other

## 2022-01-14 ENCOUNTER — Encounter (HOSPITAL_COMMUNITY): Payer: Self-pay | Admitting: Family Medicine

## 2022-01-14 DIAGNOSIS — Z9181 History of falling: Secondary | ICD-10-CM | POA: Diagnosis not present

## 2022-01-14 DIAGNOSIS — E785 Hyperlipidemia, unspecified: Secondary | ICD-10-CM | POA: Diagnosis present

## 2022-01-14 DIAGNOSIS — Y92008 Other place in unspecified non-institutional (private) residence as the place of occurrence of the external cause: Secondary | ICD-10-CM | POA: Diagnosis not present

## 2022-01-14 DIAGNOSIS — F1021 Alcohol dependence, in remission: Secondary | ICD-10-CM | POA: Diagnosis present

## 2022-01-14 DIAGNOSIS — I2510 Atherosclerotic heart disease of native coronary artery without angina pectoris: Secondary | ICD-10-CM | POA: Diagnosis not present

## 2022-01-14 DIAGNOSIS — E43 Unspecified severe protein-calorie malnutrition: Secondary | ICD-10-CM

## 2022-01-14 DIAGNOSIS — E876 Hypokalemia: Secondary | ICD-10-CM | POA: Diagnosis present

## 2022-01-14 DIAGNOSIS — R296 Repeated falls: Secondary | ICD-10-CM | POA: Diagnosis not present

## 2022-01-14 DIAGNOSIS — R54 Age-related physical debility: Secondary | ICD-10-CM | POA: Diagnosis present

## 2022-01-14 DIAGNOSIS — T796XXA Traumatic ischemia of muscle, initial encounter: Secondary | ICD-10-CM | POA: Diagnosis not present

## 2022-01-14 DIAGNOSIS — E1010 Type 1 diabetes mellitus with ketoacidosis without coma: Secondary | ICD-10-CM | POA: Diagnosis not present

## 2022-01-14 DIAGNOSIS — D72829 Elevated white blood cell count, unspecified: Secondary | ICD-10-CM | POA: Diagnosis present

## 2022-01-14 DIAGNOSIS — E860 Dehydration: Secondary | ICD-10-CM | POA: Diagnosis present

## 2022-01-14 DIAGNOSIS — M6282 Rhabdomyolysis: Secondary | ICD-10-CM | POA: Diagnosis not present

## 2022-01-14 DIAGNOSIS — L89892 Pressure ulcer of other site, stage 2: Secondary | ICD-10-CM | POA: Diagnosis present

## 2022-01-14 DIAGNOSIS — M25551 Pain in right hip: Secondary | ICD-10-CM | POA: Diagnosis not present

## 2022-01-14 DIAGNOSIS — I10 Essential (primary) hypertension: Secondary | ICD-10-CM | POA: Diagnosis not present

## 2022-01-14 DIAGNOSIS — R64 Cachexia: Secondary | ICD-10-CM | POA: Diagnosis present

## 2022-01-14 DIAGNOSIS — I6930 Unspecified sequelae of cerebral infarction: Secondary | ICD-10-CM

## 2022-01-14 DIAGNOSIS — Z91199 Patient's noncompliance with other medical treatment and regimen due to unspecified reason: Secondary | ICD-10-CM | POA: Diagnosis not present

## 2022-01-14 DIAGNOSIS — F32A Depression, unspecified: Secondary | ICD-10-CM | POA: Diagnosis present

## 2022-01-14 DIAGNOSIS — R6510 Systemic inflammatory response syndrome (SIRS) of non-infectious origin without acute organ dysfunction: Secondary | ICD-10-CM | POA: Diagnosis present

## 2022-01-14 DIAGNOSIS — R079 Chest pain, unspecified: Secondary | ICD-10-CM | POA: Diagnosis not present

## 2022-01-14 DIAGNOSIS — L89212 Pressure ulcer of right hip, stage 2: Secondary | ICD-10-CM | POA: Diagnosis present

## 2022-01-14 DIAGNOSIS — E11649 Type 2 diabetes mellitus with hypoglycemia without coma: Secondary | ICD-10-CM | POA: Diagnosis not present

## 2022-01-14 DIAGNOSIS — W1830XA Fall on same level, unspecified, initial encounter: Secondary | ICD-10-CM | POA: Diagnosis present

## 2022-01-14 DIAGNOSIS — L89890 Pressure ulcer of other site, unstageable: Secondary | ICD-10-CM | POA: Diagnosis present

## 2022-01-14 DIAGNOSIS — M25512 Pain in left shoulder: Secondary | ICD-10-CM | POA: Diagnosis not present

## 2022-01-14 DIAGNOSIS — N179 Acute kidney failure, unspecified: Secondary | ICD-10-CM | POA: Diagnosis present

## 2022-01-14 DIAGNOSIS — Z681 Body mass index (BMI) 19 or less, adult: Secondary | ICD-10-CM | POA: Diagnosis not present

## 2022-01-14 DIAGNOSIS — E1110 Type 2 diabetes mellitus with ketoacidosis without coma: Secondary | ICD-10-CM | POA: Diagnosis present

## 2022-01-14 LAB — CBC WITH DIFFERENTIAL/PLATELET
Abs Immature Granulocytes: 0.17 10*3/uL — ABNORMAL HIGH (ref 0.00–0.07)
Basophils Absolute: 0 10*3/uL (ref 0.0–0.1)
Basophils Relative: 0 %
Eosinophils Absolute: 0 10*3/uL (ref 0.0–0.5)
Eosinophils Relative: 0 %
HCT: 41.2 % (ref 39.0–52.0)
Hemoglobin: 13.4 g/dL (ref 13.0–17.0)
Immature Granulocytes: 1 %
Lymphocytes Relative: 6 %
Lymphs Abs: 1.1 10*3/uL (ref 0.7–4.0)
MCH: 31.8 pg (ref 26.0–34.0)
MCHC: 32.5 g/dL (ref 30.0–36.0)
MCV: 97.9 fL (ref 80.0–100.0)
Monocytes Absolute: 1.2 10*3/uL — ABNORMAL HIGH (ref 0.1–1.0)
Monocytes Relative: 6 %
Neutro Abs: 16.2 10*3/uL — ABNORMAL HIGH (ref 1.7–7.7)
Neutrophils Relative %: 87 %
Platelets: 382 10*3/uL (ref 150–400)
RBC: 4.21 MIL/uL — ABNORMAL LOW (ref 4.22–5.81)
RDW: 14.5 % (ref 11.5–15.5)
WBC: 18.7 10*3/uL — ABNORMAL HIGH (ref 4.0–10.5)
nRBC: 0 % (ref 0.0–0.2)

## 2022-01-14 LAB — I-STAT CHEM 8, ED
BUN: 34 mg/dL — ABNORMAL HIGH (ref 6–20)
Calcium, Ion: 1.02 mmol/L — ABNORMAL LOW (ref 1.15–1.40)
Chloride: 102 mmol/L (ref 98–111)
Creatinine, Ser: 0.7 mg/dL (ref 0.61–1.24)
Glucose, Bld: 518 mg/dL (ref 70–99)
HCT: 45 % (ref 39.0–52.0)
Hemoglobin: 15.3 g/dL (ref 13.0–17.0)
Potassium: 4.7 mmol/L (ref 3.5–5.1)
Sodium: 136 mmol/L (ref 135–145)
TCO2: 15 mmol/L — ABNORMAL LOW (ref 22–32)

## 2022-01-14 LAB — CK
Total CK: 1094 U/L — ABNORMAL HIGH (ref 49–397)
Total CK: 851 U/L — ABNORMAL HIGH (ref 49–397)

## 2022-01-14 LAB — BASIC METABOLIC PANEL
Anion gap: 12 (ref 5–15)
Anion gap: 12 (ref 5–15)
BUN: 28 mg/dL — ABNORMAL HIGH (ref 6–20)
BUN: 25 mg/dL — ABNORMAL HIGH (ref 6–20)
CO2: 21 mmol/L — ABNORMAL LOW (ref 22–32)
CO2: 17 mmol/L — ABNORMAL LOW (ref 22–32)
Calcium: 8.7 mg/dL — ABNORMAL LOW (ref 8.9–10.3)
Calcium: 8.2 mg/dL — ABNORMAL LOW (ref 8.9–10.3)
Chloride: 107 mmol/L (ref 98–111)
Chloride: 107 mmol/L (ref 98–111)
Creatinine, Ser: 1.05 mg/dL (ref 0.61–1.24)
Creatinine, Ser: 0.73 mg/dL (ref 0.61–1.24)
GFR, Estimated: >60 mL/min (ref >60)
GFR, Estimated: >60 mL/min (ref >60)
Glucose, Bld: 141 mg/dL — ABNORMAL HIGH (ref 70–99)
Glucose, Bld: 353 mg/dL — ABNORMAL HIGH (ref 70–99)
Potassium: 3.9 mmol/L (ref 3.5–5.1)
Potassium: 5.1 mmol/L (ref 3.5–5.1)
Sodium: 140 mmol/L (ref 135–145)
Sodium: 136 mmol/L (ref 135–145)

## 2022-01-14 LAB — URINALYSIS, ROUTINE W REFLEX MICROSCOPIC
Bacteria, UA: NONE SEEN
Bilirubin Urine: NEGATIVE
Glucose, UA: >=500 mg/dL — AB
Hgb urine dipstick: NEGATIVE
Ketones, ur: 80 mg/dL — AB
Leukocytes,Ua: NEGATIVE
Nitrite: NEGATIVE
Protein, ur: 100 mg/dL — AB
Specific Gravity, Urine: 1.025 (ref 1.005–1.030)
pH: 5 (ref 5.0–8.0)

## 2022-01-14 LAB — COMPREHENSIVE METABOLIC PANEL
ALT: 40 U/L (ref 0–44)
AST: 41 U/L (ref 15–41)
Albumin: 4 g/dL (ref 3.5–5.0)
Alkaline Phosphatase: 127 U/L — ABNORMAL HIGH (ref 38–126)
Anion gap: 26 — ABNORMAL HIGH (ref 5–15)
BUN: 32 mg/dL — ABNORMAL HIGH (ref 6–20)
CO2: 12 mmol/L — ABNORMAL LOW (ref 22–32)
Calcium: 9.9 mg/dL (ref 8.9–10.3)
Chloride: 98 mmol/L (ref 98–111)
Creatinine, Ser: 1.44 mg/dL — ABNORMAL HIGH (ref 0.61–1.24)
GFR, Estimated: 57 mL/min — ABNORMAL LOW (ref >60)
Glucose, Bld: 531 mg/dL (ref 70–99)
Potassium: 4.9 mmol/L (ref 3.5–5.1)
Sodium: 136 mmol/L (ref 135–145)
Total Bilirubin: 1.6 mg/dL — ABNORMAL HIGH (ref 0.3–1.2)
Total Protein: 7.7 g/dL (ref 6.5–8.1)

## 2022-01-14 LAB — I-STAT VENOUS BLOOD GAS, ED
Acid-base deficit: 11 mmol/L — ABNORMAL HIGH (ref 0.0–2.0)
Bicarbonate: 14.3 mmol/L — ABNORMAL LOW (ref 20.0–28.0)
Calcium, Ion: 1.17 mmol/L (ref 1.15–1.40)
HCT: 47 % (ref 39.0–52.0)
Hemoglobin: 16 g/dL (ref 13.0–17.0)
O2 Saturation: 86 %
Potassium: 4.7 mmol/L (ref 3.5–5.1)
Sodium: 134 mmol/L — ABNORMAL LOW (ref 135–145)
TCO2: 15 mmol/L — ABNORMAL LOW (ref 22–32)
pCO2, Ven: 29.2 mmHg — ABNORMAL LOW (ref 44–60)
pH, Ven: 7.299 (ref 7.25–7.43)
pO2, Ven: 56 mmHg — ABNORMAL HIGH (ref 32–45)

## 2022-01-14 LAB — CBG MONITORING, ED
Glucose-Capillary: 139 mg/dL — ABNORMAL HIGH (ref 70–99)
Glucose-Capillary: 271 mg/dL — ABNORMAL HIGH (ref 70–99)
Glucose-Capillary: 317 mg/dL — ABNORMAL HIGH (ref 70–99)
Glucose-Capillary: 326 mg/dL — ABNORMAL HIGH (ref 70–99)
Glucose-Capillary: 407 mg/dL — ABNORMAL HIGH (ref 70–99)
Glucose-Capillary: 484 mg/dL — ABNORMAL HIGH (ref 70–99)
Glucose-Capillary: 523 mg/dL (ref 70–99)
Glucose-Capillary: 84 mg/dL (ref 70–99)

## 2022-01-14 LAB — BETA-HYDROXYBUTYRIC ACID
Beta-Hydroxybutyric Acid: >8 mmol/L — ABNORMAL HIGH (ref 0.05–0.27)
Beta-Hydroxybutyric Acid: 2.68 mmol/L — ABNORMAL HIGH (ref 0.05–0.27)

## 2022-01-14 LAB — VITAMIN B12: Vitamin B-12: 1001 pg/mL — ABNORMAL HIGH (ref 180–914)

## 2022-01-14 LAB — GLUCOSE, CAPILLARY
Glucose-Capillary: 228 mg/dL — ABNORMAL HIGH (ref 70–99)
Glucose-Capillary: 145 mg/dL — ABNORMAL HIGH (ref 70–99)

## 2022-01-14 LAB — ETHANOL: Alcohol, Ethyl (B): <10 mg/dL (ref <10)

## 2022-01-14 LAB — TSH: TSH: 0.515 u[IU]/mL (ref 0.350–4.500)

## 2022-01-14 MED ORDER — DEXTROSE IN LACTATED RINGERS 5 % IV SOLN: INTRAVENOUS | Status: DC

## 2022-01-14 MED ORDER — TRAZODONE HCL 50 MG PO TABS
150.0000 mg | ORAL_TABLET | Freq: Every day | ORAL | Status: DC
  Administered 2022-01-14 – 2022-01-17 (×4): 150 mg via ORAL
  Filled 2022-01-14 (×4): qty 1

## 2022-01-14 MED ORDER — SODIUM CHLORIDE 0.9 % IV BOLUS (SEPSIS)
1000.0000 mL | Freq: Once | INTRAVENOUS | Status: AC
  Administered 2022-01-14: 1000 mL via INTRAVENOUS

## 2022-01-14 MED ORDER — SODIUM CHLORIDE 0.9 % IV SOLN: 1000.0000 mL | INTRAVENOUS | Status: DC

## 2022-01-14 MED ORDER — POTASSIUM CHLORIDE 10 MEQ/100ML IV SOLN
10.0000 meq | INTRAVENOUS | Status: AC
  Administered 2022-01-14 (×2): 10 meq via INTRAVENOUS
  Filled 2022-01-14 (×2): qty 100

## 2022-01-14 MED ORDER — INSULIN ASPART 100 UNIT/ML IJ SOLN
0.0000 [IU] | Freq: Three times a day (TID) | INTRAMUSCULAR | Status: DC
  Administered 2022-01-14: 5 [IU] via SUBCUTANEOUS
  Administered 2022-01-14: 3 [IU] via SUBCUTANEOUS
  Administered 2022-01-14: 1 [IU] via SUBCUTANEOUS
  Administered 2022-01-15: 2 [IU] via SUBCUTANEOUS
  Administered 2022-01-15: 9 [IU] via SUBCUTANEOUS
  Administered 2022-01-16: 5 [IU] via SUBCUTANEOUS
  Administered 2022-01-16: 7 [IU] via SUBCUTANEOUS
  Administered 2022-01-17 (×3): 3 [IU] via SUBCUTANEOUS

## 2022-01-14 MED ORDER — POTASSIUM CHLORIDE 10 MEQ/100ML IV SOLN: 10.0000 meq | Freq: Once | INTRAVENOUS | Status: DC

## 2022-01-14 MED ORDER — LACTATED RINGERS IV SOLN: INTRAVENOUS | Status: DC

## 2022-01-14 MED ORDER — INSULIN REGULAR(HUMAN) IN NACL 100-0.9 UT/100ML-% IV SOLN
INTRAVENOUS | Status: DC
  Administered 2022-01-14: 10 [IU]/h via INTRAVENOUS
  Filled 2022-01-14: qty 100

## 2022-01-14 MED ORDER — SIMVASTATIN 20 MG PO TABS
20.0000 mg | ORAL_TABLET | Freq: Every day | ORAL | Status: DC
  Administered 2022-01-14 – 2022-01-17 (×4): 20 mg via ORAL
  Filled 2022-01-14 (×4): qty 1

## 2022-01-14 MED ORDER — INSULIN DETEMIR 100 UNIT/ML ~~LOC~~ SOLN
20.0000 [IU] | Freq: Every day | SUBCUTANEOUS | Status: DC
  Administered 2022-01-14: 20 [IU] via SUBCUTANEOUS
  Filled 2022-01-14 (×2): qty 0.2

## 2022-01-14 MED ORDER — DEXTROSE 50 % IV SOLN: 0.0000 mL | INTRAVENOUS | Status: DC | PRN

## 2022-01-14 MED ORDER — INSULIN ASPART 100 UNIT/ML IJ SOLN
3.0000 [IU] | Freq: Three times a day (TID) | INTRAMUSCULAR | Status: DC
  Administered 2022-01-14 – 2022-01-17 (×11): 3 [IU] via SUBCUTANEOUS

## 2022-01-14 MED ORDER — INSULIN ASPART 100 UNIT/ML IJ SOLN
0.0000 [IU] | Freq: Every day | INTRAMUSCULAR | Status: DC
  Administered 2022-01-17: 3 [IU] via SUBCUTANEOUS

## 2022-01-14 MED ORDER — BUSPIRONE HCL 5 MG PO TABS
10.0000 mg | ORAL_TABLET | Freq: Two times a day (BID) | ORAL | Status: DC
  Administered 2022-01-14 – 2022-01-17 (×8): 10 mg via ORAL
  Filled 2022-01-14 (×3): qty 2
  Filled 2022-01-14: qty 1
  Filled 2022-01-14 (×4): qty 2

## 2022-01-14 MED ORDER — MEDIHONEY WOUND/BURN DRESSING EX PSTE
1.0000 "application " | PASTE | Freq: Every day | CUTANEOUS | Status: DC
  Administered 2022-01-15 – 2022-01-17 (×3): 1 via TOPICAL
  Filled 2022-01-14: qty 44

## 2022-01-14 MED ORDER — ASPIRIN EC 81 MG PO TBEC
81.0000 mg | DELAYED_RELEASE_TABLET | Freq: Every day | ORAL | Status: DC
  Administered 2022-01-14 – 2022-01-17 (×4): 81 mg via ORAL
  Filled 2022-01-14 (×4): qty 1

## 2022-01-14 MED ORDER — FAMOTIDINE 20 MG PO TABS
10.0000 mg | ORAL_TABLET | Freq: Two times a day (BID) | ORAL | Status: DC
  Administered 2022-01-14 – 2022-01-17 (×8): 10 mg via ORAL
  Filled 2022-01-14 (×8): qty 1

## 2022-01-14 MED ORDER — SODIUM CHLORIDE 0.9 % IV SOLN: 1.0000 g | Freq: Once | INTRAVENOUS | Status: DC

## 2022-01-14 MED ORDER — ENOXAPARIN SODIUM 40 MG/0.4ML IJ SOSY
40.0000 mg | PREFILLED_SYRINGE | INTRAMUSCULAR | Status: DC
  Administered 2022-01-14 – 2022-01-17 (×4): 40 mg via SUBCUTANEOUS
  Filled 2022-01-14 (×4): qty 0.4

## 2022-01-14 MED ORDER — ACETAMINOPHEN 325 MG PO TABS
650.0000 mg | ORAL_TABLET | Freq: Four times a day (QID) | ORAL | Status: DC | PRN
  Administered 2022-01-14 – 2022-01-17 (×4): 650 mg via ORAL
  Filled 2022-01-14 (×4): qty 2

## 2022-01-14 MED ORDER — CLOPIDOGREL BISULFATE 75 MG PO TABS
75.0000 mg | ORAL_TABLET | Freq: Every day | ORAL | Status: DC
  Administered 2022-01-14 – 2022-01-17 (×4): 75 mg via ORAL
  Filled 2022-01-14 (×4): qty 1

## 2022-01-14 MED ORDER — TRAMADOL HCL 50 MG PO TABS
50.0000 mg | ORAL_TABLET | Freq: Four times a day (QID) | ORAL | Status: DC | PRN
  Administered 2022-01-15 – 2022-01-17 (×7): 50 mg via ORAL
  Filled 2022-01-14 (×7): qty 1

## 2022-01-14 NOTE — Evaluation (Signed)
Occupational Therapy Evaluation ?Patient Details ?Name: Brandon [name] ?MRN: [id number] ?DOB: 1965-11-14 ?Today's Date: 01/14/2022 ? ? ?History of Present Illness Pt is a 56 y/o male presenting to [facility] ED on 4/23 with falls, L shoulder pain. Imaging negative. Found with DKA a nd rhabdomyolysis.  PMH includes: recent hospital admission, alcohol abuse in remission, CAD, CVA, insulin dependent DM, HTN, malnutrition.  ? ?Clinical Impression ?  ?PTA patient reports needing assist for ADLS and IADLs from caregiver and sister, using rollator for mobility for short distances and typically only ambulating when his aide was present.  Patient admitted for above and is limited by impaired cognition, decreased activity tolerance, impaired balance, weakness and pain.  Patient reports difficulty using R UE functionally, requires hand over hand to fully wash face and pt somewhat self limiting voicing "I can't" before attempting to engage in any tasks.  Overall requires mod-max assist +2 for bed mobility, min assist +2 for sit to stand and up to total assist +2 for ADLs.  Patient follows simple commands but demonstrating poor awareness, problem solving, safety and recall.  He will benefit from continued OT services while admitted and after dc at SNF level to optimize independence, safety and return to PLOF. Will follow.  ?   ? ?Recommendations for follow up therapy are one component of a multi-disciplinary discharge planning process, led by the attending physician.  Recommendations may be updated based on patient status, additional functional criteria and insurance authorization.  ? ?Follow Up Recommendations ? Skilled nursing-short term rehab (<3 hours/day)  ?  ?Assistance Recommended at Discharge Frequent or constant Supervision/Assistance  ?Patient can return home with the following Two people to help with walking and/or transfers;A lot of help with bathing/dressing/bathroom;Assistance with cooking/housework;Direct  supervision/assist for medications management;Direct supervision/assist for financial management;Help with stairs or ramp for entrance;Assist for transportation ? ?  ?Functional Status Assessment ? Patient has had a recent decline in their functional status and demonstrates the ability to make significant improvements in function in a reasonable and predictable amount of time.  ?Equipment Recommendations ? BSC/3in1  ?  ?Recommendations for Other Services   ? ? ?  ?Precautions / Restrictions Precautions ?Precautions: Fall ?Precaution Comments: watch BP ?Restrictions ?Weight Bearing Restrictions: No  ? ?  ? ?Mobility Bed Mobility ?Overal bed mobility: Needs Assistance ?Bed Mobility: Supine to Sit, Sit to Supine ?  ?  ?Supine to sit: Mod assist, +2 for physical assistance, +2 for safety/equipment ?Sit to supine: Max assist, +2 for physical assistance, +2 for safety/equipment ?  ?General bed mobility comments: pt able to initate movement of BLEs and trunk but overall requires assist to fully ascend and scoot forward. requires trunk and LE support to return supine ?  ? ?Transfers ?  ?  ?  ?  ?  ?  ?  ?  ?  ?  ?  ? ?  ?Balance Overall balance assessment: Needs assistance ?Sitting-balance support: No upper extremity supported, Feet supported ?Sitting balance-Leahy Scale: Fair ?Sitting balance - Comments: min guard statically but limited dynamically ?  ?Standing balance support: Single extremity supported, During functional activity ?Standing balance-Leahy Scale: Fair ?Standing balance comment: relies on UE and external support ?  ?  ?  ?  ?  ?  ?  ?  ?  ?  ?  ?   ? ?ADL either performed or assessed with clinical judgement  ? ?ADL Overall ADL's : Needs assistance/impaired ?  ?  ?Grooming: Wash/dry face;Minimal assistance;Sitting ?  ?  ?  ?  ?  ?  Upper Body Dressing : Maximal assistance;Sitting ?  ?Lower Body Dressing: Total assistance;+2 for physical assistance;+2 for safety/equipment;Sit to/from stand ?  ?Toilet Transfer:  Minimal assistance;+2 for safety/equipment;+2 for physical assistance ?Toilet Transfer Details (indicate cue type and reason): simulated side stepping to [facility] ?Toileting- Clothing Manipulation and Hygiene: Total assistance;+2 for physical assistance;+2 for safety/equipment;Sit to/from stand ?  ?  ?  ?Functional mobility during ADLs: Minimal assistance;+2 for physical assistance;+2 for safety/equipment ?General ADL Comments: pt limited by pain, decreased activity tolerance, weakness  ? ? ? ?Vision   ?Vision Assessment?: No apparent visual deficits  ?   ?Perception   ?  ?Praxis   ?  ? ?Pertinent Vitals/Pain Pain Assessment ?Pain Assessment: Faces ?Faces Pain Scale: Hurts even more ?Pain Location: R hip ?Pain Descriptors / Indicators: Discomfort, Aching ?Pain Intervention(s): Limited activity within patient's tolerance, Monitored during session, Repositioned  ? ? ? ?Hand Dominance Right ?  ?Extremity/Trunk Assessment Upper Extremity Assessment ?Upper Extremity Assessment: Generalized weakness;RUE deficits/detail;LUE deficits/detail ?RUE Deficits / Details: limited assessment due to pain in shoulder, reports difficulty moving and controlling UE; requires hand over hand to bring up to wash face ?RUE: Unable to fully assess due to pain ?RUE Coordination: decreased gross motor;decreased fine motor ?LUE Deficits / Details: able to raise to 90* flexion, generally weak ?  ?Lower Extremity Assessment ?Lower Extremity Assessment: Defer to PT evaluation ?  ?  ?  ?Communication Communication ?Communication: No difficulties ?  ?Cognition Arousal/Alertness: Awake/alert ?Behavior During Therapy: Flat affect ?Overall Cognitive Status: Impaired/Different from baseline ?Area of Impairment: Following commands, Safety/judgement, Awareness, Problem solving, Memory, Attention ?  ?  ?  ?  ?  ?  ?  ?  ?  ?Current Attention Level: Sustained ?Memory: Decreased short-term memory, Decreased recall of precautions ?Following Commands: Follows one  step commands consistently, Follows one step commands with increased time ?Safety/Judgement: Decreased awareness of safety, Decreased awareness of deficits ?Awareness: Emergent ?Problem Solving: Slow processing, Requires verbal cues, Difficulty sequencing, Decreased initiation, Requires tactile cues ?General Comments: pt follows simple commands, but demonstrates slow processing, difficulty problem solving and with poor awareness to safety and deficits.  Self limiting at times and frequently voices he can't do something without even trying. ?  ?  ?General Comments  pt with skin breakdown noted on R hip, R lateral knee, R shoulder blade and L shoulder ? ?  ?Exercises   ?  ?Shoulder Instructions    ? ? ?Home Living Family/patient expects to be discharged to:: Private residence ?Living Arrangements: Other relatives (sister) ?Available Help at Discharge: Family;Available PRN/intermittently;Personal care attendant ?Type of Home: House ?Home Access: Stairs to enter ?Entrance Stairs-Number of Steps: 5 ?Entrance Stairs-Rails: Right;Left;Can reach both ?Home Layout: Two level;Able to live on main level with bedroom/bathroom ?  ?  ?Bathroom Shower/Tub: Tub/shower unit;Sponge bathes at baseline ?  ?Bathroom Toilet: Standard ?  ?  ?Home Equipment: Rollator (4 wheels);Rolling Walker (2 wheels);Shower seat ?  ?  ?  ? ?  ?Prior Functioning/Environment Prior Level of Function : Needs assist;History of Falls (last six months);Patient poor historian/Family not available ?  ?  ?  ?  ?  ?  ?Mobility Comments: reports using rollator for mobility, only ambulating when caregiver is present (short distances) ?ADLs Comments: reports having caregiver assist with ADLS and sponge bathing at baseline, some iADLS (from sister who prepares meals), caregiver M-F 3 hours/day. ?  ? ?  ?  ?OT Problem List: Decreased strength;Decreased activity tolerance;Impaired balance (sitting and/or standing);Pain;Impaired UE functional use;Decreased  knowledge of  precautions;Decreased knowledge of use of DME or AE;Decreased safety awareness;Decreased cognition;Decreased coordination ?  ?   ?OT Treatment/Interventions: Self-care/ADL training;Therapeutic exercise;DME and/or AE ins

## 2022-01-14 NOTE — Progress Notes (Signed)
Inpatient Diabetes Program Recommendations ? ?AACE/ADA: New Consensus Statement on Inpatient Glycemic Control (2015) ? ?Target Ranges:  Prepandial:   less than 140 mg/dL ?     Peak postprandial:   less than 180 mg/dL (1-2 hours) ?     Critically ill patients:  140 - 180 mg/dL  ? ? Latest Reference Range & Units 01/14/22 00:35  ?Sodium 135 - 145 mmol/L 136  ?Potassium 3.5 - 5.1 mmol/L 4.9  ?Chloride 98 - 111 mmol/L 98  ?CO2 22 - 32 mmol/L 12 (L)  ?Glucose 70 - 99 mg/dL 712 (HH)  ?BUN 6 - 20 mg/dL 32 (H)  ?Creatinine 0.61 - 1.24 mg/dL 4.58 (H)  ?Calcium 8.9 - 10.3 mg/dL 9.9  ?Anion gap 5 - 15  26 (H)  ? ? Latest Reference Range & Units 01/08/22 04:24  ?Hemoglobin A1C 4.8 - 5.6 % 13.1 (H)  ?(H): Data is abnormally high ? Latest Reference Range & Units 01/14/22 00:35 01/14/22 04:02  ?Beta-Hydroxybutyric Acid 0.05 - 0.27 mmol/L >8.00 (H) 2.68 (H)  ? ? Latest Reference Range & Units 01/14/22 00:49 01/14/22 01:39 01/14/22 02:20 01/14/22 02:52 01/14/22 04:01 01/14/22 05:17 01/14/22 07:10  ?Glucose-Capillary 70 - 99 mg/dL 099 (HH) 833 (H) ? ?IV Insulin Drip started at 0151am 407 (H) 326 (H) 317 (H) 84 ? ?IV Insulin Drip Stopped at 0522 139 (H)  ? ? ?Admit with:  ?DKA ?Falls, left shoulder pain  ?Rhabdomyolysis  ? ?History: DM, CVA, ETOH Abuse, Malnutrition ? ?Home DM Meds: Levemir 25 units Daily ?      Novolog 4 units TID ?      Metformin 1000 mg BID ? ?Current Orders: IV Insulin Drip ? ? ? ?Was just admitted at [facility] 01/07/2022 through 01/09/2022 for DKA and Fall at home ?Counseled by the Diabetes Coordinator RN on 01/08/2022 ?Given Rx to increase his Levemir insulin at home from 12 units daily to 25 units daily on day of discharge ? ? ? ?MD- It appears the IV Insulin Drip was stopped at 5:22am today.   ? ?BMET from 4:02am showed: ?Glucose 141/ Anion Gap 12/ CO2 level 21 ? ?I do not see that any long-acting basal insulin was given prior to drip being stopped?? ? ?If decision made to keep pt off the IV Insulin Drip this  AM, please consider: ? ?1. Start Levemir 18 units Daily (75% total home dose)--please start this AM asap ? ?2. Start Novolog Sensitive Correction Scale/ SSI (0-9 units) TID AC + HS ? ? ? ? ?--Will follow patient during hospitalization-- ? ?Ambrose Finland RN, MSN, CDE ?Diabetes Coordinator ?Inpatient Glycemic Control Team ?Team Pager: [phone] (8a-5p) ? ? ? ? ? ? ? ?

## 2022-01-14 NOTE — Consult Note (Signed)
WOC Nurse Consult Note: ?Reason for Consult:Patient in ED with weakness, unattended falls at home with prolonged time "down".  Trauma wounds to R hip, Right lateral knee, left shoulder and right posterior scapula. All present on admission.  ?Patient able to turn self in bed with minimal assistance. (Verbal cueing) ?Moisture associated skin damage to bilateral buttocks with nonintact lesion on left gluteal fold.  ?Wound type:trauma/pressure and moisture.  ?Pressure Injury POA: Yes ?Measurement: Right hip:  abrasion 6 cm x 5 cm x 0.1 cm peeling epithelium with 0.5 cm scabbed center (yellow fibrin) ?1 cm fibrin scab to right lateral knee ?Abrasion to right forearm:  0.3 cm  ?Left shoulder with peeling epithelium and ruborous nonblanchable 3 cmx 2 cm area.  ?Nonblanchable erythema to right posterior scapula ?Skin is dry all over ?Mycotic toenails ?Wound bed:see above ?Drainage (amount, consistency, odor) None noted.   ?Periwound: dry skin ? ?Dressing procedure/placement/frequency: Cleanse buttocks with soap and water.  Apply Barrier cream to buttocks each shift and PRN soilage.  ?Apply Medihoney to wounds on Right hip, right arm and right knee.  Cover with foam dressing to pad and protect. Peel back foam and reapply daily and change foam every three days and PRN soilage.  ?Will not follow at this time.  Please re-consult if needed.  ?Maple Hudson MSN, RN, FNP-BC CWON ?Wound, Ostomy, Continence Nurse ?Pager [phone]  ? ? ?  ?

## 2022-01-14 NOTE — Progress Notes (Signed)
Patient seen and examined in the emergency room.  Admitted early morning hours by nighttime hospitalist.  Recently admitted to [facility] as below. ? ?In brief, 55 year old chronically sick gentleman with history of stroke and right hemiparesis, insulin-dependent type 2 diabetes, hypertension and malnutrition presented with recurrent fall, unable to get off the floor and left shoulder pain.  Uses four-wheel walker at home, has chronic numbness and tingling of the feet.  Recently admitted to [facility] hospital with hyperglycemia, treated with insulin and discharged home on increased dose of insulin.  In the emergency room afebrile, tachycardic, systolic blood pressure 98.  Blood glucose 531, anion gap 26.  Leukocytosis 18,000.  Skeletal survey was negative.  Patient was given IV fluid boluses and started on insulin infusion and admitted. ? ?Diabetic ketoacidosis: Started on insulin infusion.  On my evaluation, his insulin drip was stopped for more than 2 hours due to blood sugar of 84.  Patient without nausea or vomiting.  Anion gap closed.  Blood sugars 130. ?Start Levemir 20 units, first dose now and will continue daily.  Start prandial insulin and sliding scale insulin.  Continue maintenance IV fluids today, recheck CK levels tomorrow.  Work with PT OT.  Will benefit with short-term rehab.  Referred to a skilled nursing facility.  Has a history of stroke with no evidence of recurrence.  And currently on aspirin Plavix and statin.  Continue. ? ? ?Total time spent: 35 minutes.  Same-day visit.  No charge. ?

## 2022-01-14 NOTE — Evaluation (Signed)
Physical Therapy Evaluation ?Patient Details ?Name: Brandon [name] ?MRN: [id number] ?DOB: [birth year] ?Today's Date: 01/14/2022 ? ?History of Present Illness ? Pt is a 56 y/o male presenting to [facility] ED on 4/23 with falls, L shoulder pain. Imaging negative. Found with DKA and rhabdomyolysis.  PMH includes: recent hospital admission, alcohol abuse in remission, CAD, CVA, insulin dependent DM, HTN, malnutrition.  ?Clinical Impression ? Patient presented to the ED on 01/13/22 after sustaining multiple falls and found to have DKA. Pt's impairments include decreased cognition, decreased balance, strength, range of motion, and activity tolerance. These impairments are limiting his ability to safely and independently transfer, ambulate, and navigate stairs. Patient requires mod Ax2 to max Ax2 for bed mobility and min Ax2 for transfers without AD. Pt unable to sequence side stepping at EOB after pericare. Pt did have soft BP throughout session, however, consistent. BP in sitting 84/9mmHg. RN aware. SPT recommending SNF upon D/C due to mobility deficits. PT will continue to follow acutely. ?   ?   ? ?Recommendations for follow up therapy are one component of a multi-disciplinary discharge planning process, led by the attending physician.  Recommendations may be updated based on patient status, additional functional criteria and insurance authorization. ? ?Follow Up Recommendations Skilled nursing-short term rehab (<3 hours/day) ? ?  ?Assistance Recommended at Discharge Frequent or constant Supervision/Assistance  ?Patient can return home with the following ? A lot of help with bathing/dressing/bathroom;A lot of help with walking and/or transfers;Assistance with cooking/housework;Direct supervision/assist for medications management;Direct supervision/assist for financial management;Assist for transportation;Help with stairs or ramp for entrance ? ?  ?Equipment Recommendations Wheelchair (measurements PT);Wheelchair cushion  (measurements PT);BSC/3in1  ?Recommendations for Other Services ?    ?  ?Functional Status Assessment Patient has had a recent decline in their functional status and/or demonstrates limited ability to make significant improvements in function in a reasonable and predictable amount of time  ? ?  ?Precautions / Restrictions Precautions ?Precautions: Fall ?Precaution Comments: watch BP ?Restrictions ?Weight Bearing Restrictions: No  ? ?  ? ?Mobility ? Bed Mobility ?Overal bed mobility: Needs Assistance ?Bed Mobility: Supine to Sit, Sit to Supine ?  ?  ?Supine to sit: Mod assist, +2 for physical assistance, +2 for safety/equipment ?Sit to supine: Max assist, +2 for physical assistance, +2 for safety/equipment ?  ?General bed mobility comments: soft BP at 82/69mmHg in supine. Pt able to initiate movement of BLEs and trunk, however ,required mod Ax2 for trunk and LE management to sit at EOB. Decreased sequencing and coordination noted with bed mobility. He required max cues to bring LEs to EOB. Pt required max Ax2 for return to supine and adjusting in bed. Pt seated at EOB soft BP 84/11mmHg. ?  ? ?Transfers ?Overall transfer level: Needs assistance ?Equipment used: Rolling walker (2 wheels) ?Transfers: Sit to/from Stand ?Sit to Stand: Min assist, +2 safety/equipment, +2 physical assistance ?  ?  ?  ?  ?  ?General transfer comment: required min Ax2 to power up and steady. Pt using 1 person HHA for balance. Pt c/o weakness in R hip and RLE in standing during pericare with no notable LE shakiness. Pt unable to weight shift and sequence taking side steps at EOB. Further mobility deferred for safety. ?  ? ?Ambulation/Gait ?  ?  ?  ?  ?  ?  ?  ?  ? ?Stairs ?  ?  ?  ?  ?  ? ?Wheelchair Mobility ?  ? ?Modified Rankin (Stroke Patients Only) ?  ? ?  ? ?  Balance Overall balance assessment: Needs assistance ?Sitting-balance support: No upper extremity supported, Feet supported ?Sitting balance-Leahy Scale: Fair ?Sitting balance -  Comments: min guard statically but limited dynamically ?  ?Standing balance support: Single extremity supported, During functional activity ?Standing balance-Leahy Scale: Fair ?Standing balance comment: relies on UE and external support ?  ?  ?  ?  ?  ?  ?  ?  ?  ?  ?  ?   ? ? ? ?Pertinent Vitals/Pain Pain Assessment ?Pain Assessment: Faces ?Faces Pain Scale: Hurts even more ?Pain Location: R hip ?Pain Descriptors / Indicators: Discomfort, Aching ?Pain Intervention(s): Limited activity within patient's tolerance  ? ? ?Home Living Family/patient expects to be discharged to:: Private residence ?Living Arrangements: Other relatives (sister) ?Available Help at Discharge: Family;Available PRN/intermittently;Personal care attendant ?Type of Home: House ?Home Access: Stairs to enter ?Entrance Stairs-Rails: Right;Left;Can reach both ?Entrance Stairs-Number of Steps: 5 ?  ?Home Layout: Two level;Able to live on main level with bedroom/bathroom ?Home Equipment: Rollator (4 wheels);Rolling Walker (2 wheels);Shower seat ?Additional Comments: per patient, sister is getting tired of caring for him. PCA present Monday-Thursday 9am-12pm.  ?  ?Prior Function Prior Level of Function : Needs assist;History of Falls (last six months);Patient poor historian/Family not available ?  ?  ?  ?  ?  ?  ?Mobility Comments: reports using rollator for mobility, only ambulating when caregiver is present (short distances) ?ADLs Comments: reports having caregiver assist with ADLS and sponge bathing at baseline, some iADLS (from sister who prepares meals), caregiver M-F 3 hours/day. ?  ? ? ?Hand Dominance  ? Dominant Hand: Right ? ?  ?Extremity/Trunk Assessment  ? Upper Extremity Assessment ?Upper Extremity Assessment: Defer to OT evaluation ?RUE Deficits / Details: limited assessment due to pain in shoulder, reports difficulty moving and controlling UE; requires hand over hand to bring up to wash face ?RUE: Unable to fully assess due to pain ?RUE  Coordination: decreased gross motor;decreased fine motor ?LUE Deficits / Details: able to raise to 90* flexion, generally weak ?  ? ?Lower Extremity Assessment ?Lower Extremity Assessment: Generalized weakness ?RLE Deficits / Details: limited assessment due to decreased cognition, however, pt able to lift RLE off of ED stretcher mattress. Wounds noted over R greater trochanter and R fibular head. ?LLE Deficits / Details: limited assessment due to decreased cognition, however, pt able to lift LLE off of ED stretcher mattress. No wounds noted on LLE ?  ? ?Cervical / Trunk Assessment ?Cervical / Trunk Assessment: Kyphotic  ?Communication  ? Communication: No difficulties  ?Cognition Arousal/Alertness: Awake/alert ?Behavior During Therapy: Flat affect ?Overall Cognitive Status: Impaired/Different from baseline ?Area of Impairment: Following commands, Safety/judgement, Awareness, Problem solving, Memory, Attention ?  ?  ?  ?  ?  ?  ?  ?  ?  ?Current Attention Level: Sustained ?Memory: Decreased short-term memory, Decreased recall of precautions ?Following Commands: Follows one step commands consistently, Follows one step commands with increased time ?Safety/Judgement: Decreased awareness of safety, Decreased awareness of deficits ?Awareness: Emergent ?Problem Solving: Slow processing, Requires verbal cues, Difficulty sequencing, Decreased initiation, Requires tactile cues ?General Comments: pt follows simple commands, but demonstrates slow processing, difficulty problem solving and with poor awareness to safety and deficits.  Self limiting at times and frequently voices he can't do something without even trying. ?  ?  ? ?  ?General Comments General comments (skin integrity, edema, etc.): pt with skin breakdown noted on R hip, R lateral knee, R shoulder blade, and L shoulder ? ?  ?  Exercises    ? ?Assessment/Plan  ?  ?PT Assessment Patient needs continued PT services  ?PT Problem List Decreased strength;Decreased range of  motion;Decreased activity tolerance;Decreased balance;Decreased mobility;Decreased coordination;Decreased cognition;Decreased safety awareness;Pain;Cardiopulmonary status limiting activity ? ?   ?  ?PT Treatment

## 2022-01-14 NOTE — ED Notes (Signed)
Breakfast order placed ?

## 2022-01-14 NOTE — H&P (Signed)
?History and Physical  ? ? ?Brandon [name] RXV:[id number] DOB: 1966-06-21 DOA: 01/13/2022 ? ?PCP: Armando Gang, FNP  ? ?Patient coming from: Home  ? ?Chief Complaint: Falls, left shoulder pain  ? ?HPI: Brandon [name] is a pleasant 56 y.o. male with medical history significant for alcohol abuse in remission, CAD, history of CVA, insulin-dependent diabetes mellitus, hypertension, and malnutrition, presented to the emergency department with left shoulder pain after recurrent falls.  Patient states that he uses a four-wheel walker at home but has longstanding difficulty with balance, chronic numbness and tingling in his feet, and sometimes feels to generally weak to hold himself up even with a walker.  He reports 2 falls yesterday, was too weak to get up after the second wound, and remained on the floor for approximately 2 hours.  He landed on his left shoulder and reports pain at that area as well as some more longstanding pain in the bilateral hips and back.  He denies chest pain, palpitations, fevers, chills, cough, or shortness of breath.  He reports adherence with his insulin regimen.   ? ?ED Course: Upon arrival to the ED, patient is found to be afebrile, tachycardic in the low 100s, and with systolic blood pressure 98.  Chemistry panel notable for glucose 531 with serum bicarbonate of 12, anion gap 26, and creatinine 1.44.  CBC features a leukocytosis to 18,700.  Urine is clear with glucosuria, ketonuria, proteinuria, and low hemoglobin.  Radiographs of the left shoulder, and radiographs of the hips and pelvis are negative.  Patient was given 2 L of IV fluids and started on insulin infusion. ? ?Review of Systems:  ?All other systems reviewed and apart from HPI, are negative. ? ?Past Medical History:  ?Diagnosis Date  ? Alcohol dependency (HCC)   ? Hx ETOH withdrawal seizures before 2011  ? CAD (coronary artery disease) 06/13/2012  ? Calcification noted on CTA of chest in 2012 Wall motion abnormality on ECHO     ? Carotid artery disease (HCC) 2016  ? bilateral.  s/p left carotid stent 03/2015  ? 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 right ICA occlusion 05/2012, right MCA CVA 02/2016.   ? Depression with anxiety   ? Diabetes mellitus without complication (HCC)   ? GERD (gastroesophageal reflux disease)   ? Headache(784.0)   ? migraine  ? Heart disease 02/2015  ? PCI/DES placed to RCA: on chronic Plavix/ASA  ? Hyperlipidemia   ? Hypertension   ? Pancreatitis 2011....   ? CT findings in May 2011 with inflammation and pseudocyst.  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/2016  ? Renal artery stenosis, native, bilateral (HCC) 02/2015  ? ? ?Past Surgical History:  ?Procedure Laterality Date  ? CARDIAC CATHETERIZATION N/A 03/15/2015  ? Procedure: Left Heart Cath;  Surgeon: Laurier Nancy, MD;  Location: [facility] INVASIVE CV LAB;  Service: Cardiovascular;  Laterality: N/A;  ? CARDIAC CATHETERIZATION N/A 03/16/2015  ? Procedure: Coronary Stent Intervention;  Surgeon: Alwyn Pea, MD;  Location: [facility] INVASIVE CV LAB;  Service: Cardiovascular;  Laterality: N/A;  ? CAROTID ANGIOGRAM N/A 06/15/2012  ? Procedure: CAROTID ANGIOGRAM;  Surgeon: Chuck Hint, MD;  Location: [facility] CATH LAB;  Service: Cardiovascular;  Laterality: N/A;  ? ESOPHAGOGASTRODUODENOSCOPY N/A 03/08/2016  ? Procedure: ESOPHAGOGASTRODUODENOSCOPY (EGD);  Surgeon: Ruffin Frederick, MD;  Location: [facility] ENDOSCOPY;  Service: Gastroenterology;  Laterality: N/A;  ? none    ? PERIPHERAL VASCULAR CATHETERIZATION Left 04/06/2015  ? Procedure: Carotid PTA/Stent Intervention;  Surgeon: Annice Needy, MD;  Location: [facility] INVASIVE CV LAB;  Service: Cardiovascular;  Laterality: Left;  ? ? ?Social History:  ? reports that he has been smoking cigarettes. He has a 30.00 pack-year smoking history. He has never used smokeless tobacco. He reports that he does not drink alcohol and does not use drugs. ? ?Allergies  ?Allergen Reactions  ? No Known Allergies   ? ? ?Family History   ?Problem Relation Age of Onset  ? Stroke Mother   ?     deceased  ? Coronary artery disease Mother   ? Alcohol abuse Mother   ? Cancer Mother   ? Hypertension Father   ?     alive  ? Alcohol abuse Father   ? Diabetes Father   ? Kidney disease Father   ? Stroke Maternal Grandmother   ? ? ? ?Prior to Admission medications   ?Medication Sig Start Date End Date Taking? Authorizing Provider  ?ascorbic acid (VITAMIN C) 500 MG tablet Take 500 mg by mouth daily. 11/26/21   [provider]  ?ASPIRIN LOW DOSE 81 MG EC tablet Take 1 tablet (81 mg total) by mouth daily. 12/23/20   Leatha Gilding, MD  ?busPIRone (BUSPAR) 10 MG tablet Take 1 tablet (10 mg total) by mouth 2 (two) times daily. 12/23/20   Leatha Gilding, MD  ?clopidogrel (PLAVIX) 75 MG tablet Take 1 tablet (75 mg total) by mouth daily. 12/23/20   Leatha Gilding, MD  ?Ensure Max Protein (ENSURE MAX PROTEIN) LIQD Take 330 mLs (11 oz total) by mouth at bedtime. 01/09/22   Hollice Espy, MD  ?famotidine (PEPCID) 20 MG tablet Take 1 tablet (20 mg total) by mouth 2 (two) times daily. 12/23/20   Leatha Gilding, MD  ?feeding supplement, GLUCERNA SHAKE, (GLUCERNA SHAKE) LIQD Take 237 mLs by mouth 2 (two) times daily between meals. 01/09/22   Hollice Espy, MD  ?FEROSUL 325 (65 Fe) MG tablet Take 1 tablet (325 mg total) by mouth daily. 12/23/20   Leatha Gilding, MD  ?folic acid (FOLVITE) 1 MG tablet Take 1 tablet (1 mg total) by mouth daily. 12/23/20   Leatha Gilding, MD  ?insulin aspart (NOVOLOG) 100 UNIT/ML injection Inject 4 Units into the skin 3 (three) times daily before meals. Sliding scale    [provider]  ?insulin detemir (LEVEMIR) 100 UNIT/ML FlexPen Inject 25 Units into the skin at bedtime. 01/09/22   Hollice Espy, MD  ?Insulin Pen Needle (PEN NEEDLES) 31G X 5 MM MISC 1 each by Does not apply route daily. 12/23/20   Leatha Gilding, MD  ?lisinopril (ZESTRIL) 10 MG tablet Take 1 tablet (10 mg total) by mouth daily. 12/23/20    Leatha Gilding, MD  ?metFORMIN (GLUCOPHAGE) 1000 MG tablet Take 1 tablet (1,000 mg total) by mouth 2 (two) times daily with a meal. 12/23/20   Gherghe, Daylene Katayama, MD  ?metoprolol tartrate (LOPRESSOR) 100 MG tablet Take 100 mg by mouth daily. 10/25/21   [provider]  ?Multiple Vitamin (MULTIVITAMIN WITH MINERALS) TABS tablet Take 1 tablet by mouth daily. 12/23/20   Leatha Gilding, MD  ?nicotine (NICODERM CQ - DOSED IN MG/24 HOURS) 14 mg/24hr patch Place 1 patch (14 mg total) onto the skin daily. ?Patient taking differently: Place 14 mg onto the skin daily. Uses sometimes 12/23/20   Leatha Gilding, MD  ?simvastatin (ZOCOR) 20 MG tablet Take 1 tablet (20 mg total) by mouth daily at  6 PM. 12/23/20   Leatha GildingGherghe, Costin M, MD  ?traZODone (DESYREL) 150 MG tablet Take 1 tablet (150 mg total) by mouth at bedtime. 12/23/20   Leatha GildingGherghe, Costin M, MD  ? ? ?Physical Exam: ?Vitals:  ? 01/14/22 0215 01/14/22 0230 01/14/22 0245 01/14/22 0300  ?BP:      ?Pulse: (!) 109 (!) 106 (!) 126 86  ?Resp: 18 15 (!) 24 18  ?Temp:      ?SpO2: 100% 99% (!) 78% 92%  ? ? ?Constitutional: NAD, calm, cachectic   ?Eyes: PERTLA, lids and conjunctivae normal ?ENMT: Mucous membranes are dry. Posterior pharynx clear of any exudate or lesions.   ?Neck: supple, no masses  ?Respiratory: no wheezing, no crackles. No accessory muscle use.  ?Cardiovascular: S1 & S2 heard, regular rate and rhythm. No extremity edema.  ?Abdomen: No distension, no tenderness, soft. Bowel sounds active.  ?Musculoskeletal: no clubbing / cyanosis. No joint deformity upper and lower extremities.   ?Skin: Ecchymosis involving shoulder and hip. Warm, dry, well-perfused. ?Neurologic: CN 2-12 grossly intact. Moving all extremities. Alert and oriented.  ?Psychiatric: Pleasant. Cooperative.  ? ? ?Labs and Imaging on Admission: I have personally reviewed following labs and imaging studies ? ?CBC: ?Recent Labs  ?Lab 01/07/22 ?1326 01/09/22 ?0422 01/14/22 ?[id number] 01/14/22 ?0045  01/14/22 ?[id number]  ?WBC 17.1* 10.0 18.7*  --   --   ?NEUTROABS 15.0*  --  16.2*  --   --   ?HGB 13.7 12.2* 13.4 16.0 15.3  ?HCT 42.7 36.8* 41.2 47.0 45.0  ?MCV 95.5 92.2 97.9  --   --   ?PLT 341 270 382  --   --   ? ?Basic Me

## 2022-01-14 NOTE — ED Notes (Addendum)
Wounds noted to R hip, R lateral knee, redness and wounds to L shoulder, redness to R knee, wound to right shoulder blade, redness to L hip. ?

## 2022-01-14 NOTE — ED Notes (Signed)
Notified Ghimire MD about pt BP.  ?

## 2022-01-14 NOTE — ED Notes (Signed)
Admitting MD at bedside to put in SSI and requested D5LR be stopped.  Provided patient with graham crackers and peanut butter, readjusted patient in bed to sit up.  ?

## 2022-01-14 NOTE — Telephone Encounter (Signed)
Transition Care Management Unsuccessful Follow-up Telephone Call ? ?Date of discharge and from where:  01/09/2022-[facility] ? ?Attempts:  2nd Attempt ? ?Reason for unsuccessful TCM follow-up call:  Unable to leave message ? ?  ?

## 2022-01-14 NOTE — ED Provider Notes (Addendum)
?[facility] ?Provider Note ? ?CSN: [id number] ?Arrival date & time: 01/13/22 2353 ? ?Chief Complaint(s) ?No chief complaint on file. ? ?HPI ?Brandon [name] is a 56 y.o. male with past medical history listed below including alcohol dependency who reports not having a drink in several months, type 2 diabetes on insulin, who was admitted to [facility] earlier this month for DKA and rhabdo.  ? ?Here is here after having to falls at home.  Reports he normally walks with a walker.  States the first fall was in the bathroom and he was caught in a tight place and unable to twist and get up.  He was found by his sister later in the day.  He had another mechanical fall outside on the porch which prompted call to EMS.  Patient reports that he initially fell around noon and was only down for couple hours; however he states that his sister came by and only stayed for 45 minutes.  The second fall was around 8 PM this afternoon.  ? ?He reports being compliant with his medications but states he did not take his meds this morning.  He states that he did not have enough to drinking noted to take his medications. ? ? ? ?HPI ? ?Past Medical History ?Past Medical History:  ?Diagnosis Date  ? Alcohol dependency ([place])   ? Hx ETOH withdrawal seizures before 2011  ? CAD (coronary artery disease) 06/13/2012  ? Calcification noted on CTA of chest in 2012 Wall motion abnormality on ECHO    ? Carotid artery disease ([place]) 2016  ? bilateral.  s/p left carotid stent 03/2015  ? 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 right ICA occlusion 05/2012, right MCA CVA 02/2016.   ? Depression with anxiety   ? Diabetes mellitus without complication ([place])   ? GERD (gastroesophageal reflux disease)   ? Headache(784.0)   ? migraine  ? Heart disease 02/2015  ? PCI/DES placed to RCA: on chronic Plavix/ASA  ? Hyperlipidemia   ? Hypertension   ? Pancreatitis 2011....   ? CT findings in May 2011 with inflammation  and pseudocyst.  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/2016  ? Renal artery stenosis, native, bilateral ([place]) 02/2015  ? ?Patient Active Problem List  ? Diagnosis Date Noted  ? Pressure injury of skin 01/08/2022  ? Frequent falls 01/08/2022  ? DKA (diabetic ketoacidosis) ([place]) 01/07/2022  ? Rhabdomyolysis 01/07/2022  ? Protein-calorie malnutrition, severe 12/23/2020  ? Hyperosmolar hyperglycemic state (HHS) ([place]) 12/22/2020  ? Hyperglycemia due to type 2 diabetes mellitus ([place]) 12/21/2020  ? Hyperglycemia due to diabetes mellitus ([place]) 12/21/2020  ? Empyema lung ([place])   ? Epidural abscess   ? Diskitis 02/06/2020  ? Alcohol withdrawal delirium ([place]) 02/06/2020  ? Epilepsia ([place]) 02/06/2020  ? Pleural effusion on right 02/06/2020  ? History of stroke 01/23/2017  ? Dry eyes   ? Sleep disturbance   ? Essential hypertension, benign   ? Upper GI bleed   ? Right middle cerebral artery stroke ([place]) 03/12/2016  ? Fatty liver   ? Tobacco abuse   ? Diabetes mellitus type 2 in nonobese [facility])   ? History of CVA with residual deficit   ? Acute lower UTI   ? Tachycardia   ? Chronic alcoholic pancreatitis ([place])   ? Hyponatremia 03/09/2016  ? 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  ? Acute blood loss anemia 03/09/2016  ? Gastric varices   ? Alcohol  abuse   ? Left-sided neglect   ? Severe anemia   ? UGIB (upper gastrointestinal bleed)   ? Pressure ulcer 03/06/2016  ? Acute encephalopathy   ? CVA (cerebral infarction) 03/05/2016  ? Carotid stenosis 04/06/2015  ? CAD in native artery 03/16/2015  ? Unstable angina pectoris ([place]) 03/15/2015  ? Neck pain 10/20/2013  ? Insomnia 12/29/2012  ? Dissection of carotid artery ([place]) 12/11/2012  ? Occlusion and stenosis of carotid artery with cerebral infarction 12/11/2012  ? Unspecified cerebral artery occlusion with cerebral infarction 12/11/2012  ? Fatigue 11/26/2012  ? Hallux valgus 06/25/2012  ? Preventative health care 06/25/2012  ? Alcohol Dependence 06/18/2012  ?  Smoking 06/16/2012  ? Bilateral extracranial carotid artery stenosis   ? Coronary Artery Disease 06/13/2012  ? Ischemic Stroke 06/13/2012  ? Hyperlipidemia   ? Hypertension 02/25/2011  ? GERD (gastroesophageal reflux disease) 02/25/2011  ? Chronic Pancreatitis. 02/25/2011  ? Hepatic steatosis 02/25/2011  ? ?Home Medication(s) ?Prior to Admission medications   ?Medication Sig Start Date End Date Taking? Authorizing Provider  ?ascorbic acid (VITAMIN C) 500 MG tablet Take 500 mg by mouth daily. 11/26/21   [provider]  ?ASPIRIN LOW DOSE 81 MG EC tablet Take 1 tablet (81 mg total) by mouth daily. 12/23/20   Caren Griffins, MD  ?busPIRone (BUSPAR) 10 MG tablet Take 1 tablet (10 mg total) by mouth 2 (two) times daily. 12/23/20   Caren Griffins, MD  ?clopidogrel (PLAVIX) 75 MG tablet Take 1 tablet (75 mg total) by mouth daily. 12/23/20   Caren Griffins, MD  ?Ensure Max Protein (ENSURE MAX PROTEIN) LIQD Take 330 mLs (11 oz total) by mouth at bedtime. 01/09/22   Annita Brod, MD  ?famotidine (PEPCID) 20 MG tablet Take 1 tablet (20 mg total) by mouth 2 (two) times daily. 12/23/20   Caren Griffins, MD  ?feeding supplement, GLUCERNA SHAKE, (GLUCERNA SHAKE) LIQD Take 237 mLs by mouth 2 (two) times daily between meals. 01/09/22   Annita Brod, MD  ?FEROSUL 325 (65 Fe) MG tablet Take 1 tablet (325 mg total) by mouth daily. 12/23/20   Caren Griffins, MD  ?folic acid (FOLVITE) 1 MG tablet Take 1 tablet (1 mg total) by mouth daily. 12/23/20   Caren Griffins, MD  ?insulin aspart (NOVOLOG) 100 UNIT/ML injection Inject 4 Units into the skin 3 (three) times daily before meals. Sliding scale    [provider]  ?insulin detemir (LEVEMIR) 100 UNIT/ML FlexPen Inject 25 Units into the skin at bedtime. 01/09/22   Annita Brod, MD  ?Insulin Pen Needle (PEN NEEDLES) 31G X 5 MM MISC 1 each by Does not apply route daily. 12/23/20   Caren Griffins, MD  ?lisinopril (ZESTRIL) 10 MG tablet Take 1 tablet (10 mg  total) by mouth daily. 12/23/20   Caren Griffins, MD  ?metFORMIN (GLUCOPHAGE) 1000 MG tablet Take 1 tablet (1,000 mg total) by mouth 2 (two) times daily with a meal. 12/23/20   Gherghe, Vella Redhead, MD  ?metoprolol tartrate (LOPRESSOR) 100 MG tablet Take 100 mg by mouth daily. 10/25/21   [provider]  ?Multiple Vitamin (MULTIVITAMIN WITH MINERALS) TABS tablet Take 1 tablet by mouth daily. 12/23/20   Caren Griffins, MD  ?nicotine (NICODERM CQ - DOSED IN MG/24 HOURS) 14 mg/24hr patch Place 1 patch (14 mg total) onto the skin daily. ?Patient taking differently: Place 14 mg onto the skin daily. Uses sometimes 12/23/20   Caren Griffins,  MD  ?simvastatin (ZOCOR) 20 MG tablet Take 1 tablet (20 mg total) by mouth daily at 6 PM. 12/23/20   Gherghe, Vella Redhead, MD  ?traZODone (DESYREL) 150 MG tablet Take 1 tablet (150 mg total) by mouth at bedtime. 12/23/20   Caren Griffins, MD  ?                                                                                                                                  ?Allergies ?No known allergies ? ?Review of Systems ?Review of Systems ?As noted in HPI ? ?Physical Exam ?Vital Signs  ?I have reviewed the triage vital signs ?BP 98/77   Pulse (!) 112   Temp 98.8 ?F (37.1 ?C)   Resp 17   SpO2 93%  ? ?Physical Exam ?Vitals reviewed.  ?Constitutional:   ?   General: He is not in acute distress. ?   Appearance: He is well-developed. He is ill-appearing (chronically-ill appearing). He is not diaphoretic.  ?HENT:  ?   Head: Normocephalic and atraumatic.  ?   Nose: Nose normal.  ?Eyes:  ?   General: No scleral icterus.    ?   Right eye: No discharge.     ?   Left eye: No discharge.  ?   Conjunctiva/sclera: Conjunctivae normal.  ?   Pupils: Pupils are equal, round, and reactive to light.  ?Cardiovascular:  ?   Rate and Rhythm: Regular rhythm. Tachycardia present.  ?   Heart sounds: No murmur heard. ?  No friction rub. No gallop.  ?Pulmonary:  ?   Effort: Pulmonary effort is normal. No  respiratory distress.  ?   Breath sounds: Normal breath sounds. No stridor. No rales.  ?Abdominal:  ?   General: There is no distension.  ?   Palpations: Abdomen is soft.  ?   Tenderness: There is no abdomi

## 2022-01-14 NOTE — ED Notes (Signed)
Wound care RN at bedside  

## 2022-01-15 DIAGNOSIS — I6930 Unspecified sequelae of cerebral infarction: Secondary | ICD-10-CM | POA: Diagnosis not present

## 2022-01-15 DIAGNOSIS — E1010 Type 1 diabetes mellitus with ketoacidosis without coma: Secondary | ICD-10-CM | POA: Diagnosis not present

## 2022-01-15 DIAGNOSIS — E43 Unspecified severe protein-calorie malnutrition: Secondary | ICD-10-CM | POA: Diagnosis not present

## 2022-01-15 DIAGNOSIS — T796XXA Traumatic ischemia of muscle, initial encounter: Secondary | ICD-10-CM | POA: Diagnosis not present

## 2022-01-15 LAB — FOLATE: Folate: 16.1 ng/mL (ref >5.9)

## 2022-01-15 LAB — RETICULOCYTES
Immature Retic Fract: 19.9 % — ABNORMAL HIGH (ref 2.3–15.9)
RBC.: 3.03 MIL/uL — ABNORMAL LOW (ref 4.22–5.81)
Retic Count, Absolute: 53 10*3/uL (ref 19.0–186.0)
Retic Ct Pct: 1.8 % (ref 0.4–3.1)

## 2022-01-15 LAB — HEMOGLOBIN AND HEMATOCRIT, BLOOD
HCT: 28.5 % — ABNORMAL LOW (ref 39.0–52.0)
Hemoglobin: 9.7 g/dL — ABNORMAL LOW (ref 13.0–17.0)

## 2022-01-15 LAB — CBC WITH DIFFERENTIAL/PLATELET
Abs Immature Granulocytes: 0.05 10*3/uL (ref 0.00–0.07)
Basophils Absolute: 0 10*3/uL (ref 0.0–0.1)
Basophils Relative: 0 %
Eosinophils Absolute: 0.1 10*3/uL (ref 0.0–0.5)
Eosinophils Relative: 1 %
HCT: 28.7 % — ABNORMAL LOW (ref 39.0–52.0)
Hemoglobin: 9.6 g/dL — ABNORMAL LOW (ref 13.0–17.0)
Immature Granulocytes: 1 %
Lymphocytes Relative: 21 %
Lymphs Abs: 2 10*3/uL (ref 0.7–4.0)
MCH: 31.3 pg (ref 26.0–34.0)
MCHC: 33.4 g/dL (ref 30.0–36.0)
MCV: 93.5 fL (ref 80.0–100.0)
Monocytes Absolute: 0.7 10*3/uL (ref 0.1–1.0)
Monocytes Relative: 7 %
Neutro Abs: 6.9 10*3/uL (ref 1.7–7.7)
Neutrophils Relative %: 70 %
Platelets: 261 10*3/uL (ref 150–400)
RBC: 3.07 MIL/uL — ABNORMAL LOW (ref 4.22–5.81)
RDW: 14.2 % (ref 11.5–15.5)
WBC: 9.6 10*3/uL (ref 4.0–10.5)
nRBC: 0 % (ref 0.0–0.2)

## 2022-01-15 LAB — PHOSPHORUS: Phosphorus: 1.9 mg/dL — ABNORMAL LOW (ref 2.5–4.6)

## 2022-01-15 LAB — COMPREHENSIVE METABOLIC PANEL
ALT: 31 U/L (ref 0–44)
AST: 34 U/L (ref 15–41)
Albumin: 2.4 g/dL — ABNORMAL LOW (ref 3.5–5.0)
Alkaline Phosphatase: 67 U/L (ref 38–126)
Anion gap: 4 — ABNORMAL LOW (ref 5–15)
BUN: 16 mg/dL (ref 6–20)
CO2: 25 mmol/L (ref 22–32)
Calcium: 8.2 mg/dL — ABNORMAL LOW (ref 8.9–10.3)
Chloride: 103 mmol/L (ref 98–111)
Creatinine, Ser: 0.49 mg/dL — ABNORMAL LOW (ref 0.61–1.24)
GFR, Estimated: >60 mL/min (ref >60)
Glucose, Bld: 60 mg/dL — ABNORMAL LOW (ref 70–99)
Potassium: 3.4 mmol/L — ABNORMAL LOW (ref 3.5–5.1)
Sodium: 132 mmol/L — ABNORMAL LOW (ref 135–145)
Total Bilirubin: 0.3 mg/dL (ref 0.3–1.2)
Total Protein: 4.9 g/dL — ABNORMAL LOW (ref 6.5–8.1)

## 2022-01-15 LAB — PROTIME-INR
INR: 0.9 (ref 0.8–1.2)
Prothrombin Time: 12.2 seconds (ref 11.4–15.2)

## 2022-01-15 LAB — TYPE AND SCREEN
ABO/RH(D): O POS
Antibody Screen: NEGATIVE

## 2022-01-15 LAB — GLUCOSE, CAPILLARY
Glucose-Capillary: 63 mg/dL — ABNORMAL LOW (ref 70–99)
Glucose-Capillary: 69 mg/dL — ABNORMAL LOW (ref 70–99)
Glucose-Capillary: 145 mg/dL — ABNORMAL HIGH (ref 70–99)
Glucose-Capillary: 176 mg/dL — ABNORMAL HIGH (ref 70–99)
Glucose-Capillary: 434 mg/dL — ABNORMAL HIGH (ref 70–99)
Glucose-Capillary: 106 mg/dL — ABNORMAL HIGH (ref 70–99)
Glucose-Capillary: 148 mg/dL — ABNORMAL HIGH (ref 70–99)

## 2022-01-15 LAB — FERRITIN: Ferritin: 104 ng/mL (ref 24–336)

## 2022-01-15 LAB — IRON AND TIBC
Iron: 33 ug/dL — ABNORMAL LOW (ref 45–182)
Saturation Ratios: 14 % — ABNORMAL LOW (ref 17.9–39.5)
TIBC: 235 ug/dL — ABNORMAL LOW (ref 250–450)
UIBC: 202 ug/dL

## 2022-01-15 LAB — CK: Total CK: 465 U/L — ABNORMAL HIGH (ref 49–397)

## 2022-01-15 LAB — MAGNESIUM: Magnesium: 1.3 mg/dL — ABNORMAL LOW (ref 1.7–2.4)

## 2022-01-15 MED ORDER — INSULIN DETEMIR 100 UNIT/ML ~~LOC~~ SOLN
18.0000 [IU] | Freq: Every day | SUBCUTANEOUS | Status: DC
  Administered 2022-01-15 – 2022-01-17 (×3): 18 [IU] via SUBCUTANEOUS
  Filled 2022-01-15 (×4): qty 0.18

## 2022-01-15 MED ORDER — MAGNESIUM SULFATE 4 GM/100ML IV SOLN
4.0000 g | Freq: Once | INTRAVENOUS | Status: AC
  Administered 2022-01-15: 4 g via INTRAVENOUS
  Filled 2022-01-15: qty 100

## 2022-01-15 MED ORDER — DEXTROSE 5 % IV SOLN
30.0000 mmol | Freq: Once | INTRAVENOUS | Status: AC
  Administered 2022-01-15: 30 mmol via INTRAVENOUS
  Filled 2022-01-15: qty 10

## 2022-01-15 NOTE — Progress Notes (Signed)
TRH night cross cover note: ? ?I was notified by RN of hypoglycemic blood sugar of 60 per this morning's CMP, which correlated with ensuing CBG value of 69.  After juice, crackers, peanut butter, blood sugar has improved to 145.  Patient asymptomatic throughout this process. ? ? ? ?Babs Bertin, DO ?Hospitalist ? ?

## 2022-01-15 NOTE — Progress Notes (Signed)
TRH night cross cover note: ? ?I was notified by RN of patient's request for prn pain medication for discomfort in his hips and sacrum.  I subsequently switched for as needed Tylenol. ? ?Update: After prn dose of Tylenol, patient conveys no significant permanent aforementioned discomfort, but conveys that he has previously taken prn tramadol and notes good response to this medication without any significant side effects.  On the basis of this information, subsequently started for tramadol 50 mg p.o. every 6 hours as needed.  ? ? ? ? ?Brandon Pigg, DO ?Hospitalist ? ?

## 2022-01-15 NOTE — Consult Note (Signed)
? ?  [facility] CM Inpatient Consult ? ? ?01/15/2022 ? ?Brandon [name] ?[birth year] ?[id number] ? ?Managed Medicaid: [employer] ? ?Primary Care Provider:  Remi Haggard, FNP ? ?1610:  Met with the patient at the bedside.  Patient is up in recliner.  Explained to patient regarding the Managed Medicaid team has had difficulty maintaining contact.  Patient states he has a new phone number and he doesn't remember it right now but gives permission for anyone to speak with his sister, Delphina Cahill at [phone].  States, "She can get me a message."  States that he was getting Boost and was gaining weight on this supplement. States, "I was getting 60 cans a month and now all of that has changed and I really need it cause I was gaining weight on it."  Explained that this writer will reach out to his MM team for follow up with the information given. ? ?For questions, ? ?Natividad Brood, RN BSN CCM ?[facility] Liaison ? [phone] business mobile phone ?Toll free office [phone]  ?Fax number: [phone] ?[email or web address] ? ? ? ? ?

## 2022-01-15 NOTE — Progress Notes (Signed)
TRH night cross cover note: ? ?I was notified by RN that this morning CBC reflects hemoglobin of 9.6 relative to yesterday morning's value of 13.4, in the absence of any evidence of overt bleed.  Blood pressures remain stable, with systolic values consistently in the 90s to 100s overnight, with heart rates in the 70s to 80s, without corresponding evidence of relative tachycardia.j ? ?Brandon [name] repeat hemoglobin to confirm the validity of this morning's value, with ensuing repeat hemoglobin similar.  Suspect an element of hemodilution given interval IV fluids.  I have also added on the following laboratory studies to further evaluate patient's anemia: Iron studies, MMA, folic acid level, reticulocyte count.  Also ordered type and screen and INR.  Patient without acute complaint at this time. ? ? ?[name], DO ?Hospitalist ? ?

## 2022-01-15 NOTE — Progress Notes (Signed)
?PROGRESS NOTE ? ? ? ?Brandon [name]  TDD:[id number] DOB: 1966/07/27 DOA: 01/13/2022 ?PCP: Armando Gang, FNP  ? ? ?Brief Narrative:  ?56 year old chronically sick gentleman with history of stroke and right hemiparesis, insulin-dependent type 2 diabetes, hypertension and malnutrition presented with recurrent fall, unable to get off the floor and left shoulder pain.  Uses four-wheel walker at home, has chronic numbness and tingling of the feet.  Recently admitted to [facility] hospital with hyperglycemia, treated with insulin and discharged home on increased dose of insulin.  In the emergency room afebrile, tachycardic, systolic blood pressure 98.  Blood glucose 531, anion gap 26.  Leukocytosis 18,000.  Skeletal survey was negative.  Patient was given IV fluid boluses and started on insulin infusion and admitted ? ? ?Assessment & Plan: ?  ?Diabetic ketoacidosis in a patient with type 2 diabetes, uncontrolled with hyperglycemia: ?Treated with aggressive IV fluid and IV insulin and ultimately converted to subcu insulin. ?Home regimen Levemir 25 units, NovoLog 4 units with meals. ?4/25, early morning hypoglycemic with blood sugar 63. ?Change Levemir to 18 units once a day, continue prandial insulin 4 units with meals.  Recent known A1c of 13.  Brittle diabetes, definitely some evidence of noncompliance. ? ?Electrolytes, hypomagnesemia, hypophosphatemia, hypokalemia: Severe and persistent.  Replace aggressively today.  Recheck tomorrow. ? ?Deconditioning, frailty and debility: History of stroke, no new neurological deficits.  Currently on aspirin Plavix and statin that is continued. ?Significantly debilitated.  Will benefit with PT OT and inpatient therapies before going home. ? ?Traumatic rhabdomyolysis: With frequent fall.  Treated with IV fluids.  Improved.  Discontinue. ? ?Leukocytosis: No evidence of infection.  Improved. ? ?Moderate protein calorie malnutrition: Due to chronic medical issues.  Augment  nutrition. ?  ?  ?  ?  ? ? ?DVT prophylaxis: enoxaparin (LOVENOX) injection 40 mg Start: 01/14/22 1000 ? ? ?Code Status: Full code ?Family Communication: None ?Disposition Plan: Status is: Inpatient ?Remains inpatient appropriate because: Unsafe discharge ?  ? ? ?Consultants:  ?None ? ?Procedures:  ?None ? ?Antimicrobials:  ?None ? ? ?Subjective: ?Patient seen and examined.  No overnight events.  Eating well.  He is hungry even after eating breakfast.  Overnight blood sugar dropped to 63 but he was not having any symptoms.  We will reduce long-acting insulin by 2 units.  Agreeable to go to short-term rehab. ? ?Objective: ?Vitals:  ? 01/14/22 2100 01/14/22 2101 01/15/22 0448 01/15/22 0906  ?BP: 97/65 97/65 (!) 100/59 116/67  ?Pulse: 73 76 63 73  ?Resp:  18 16 16   ?Temp:  98 ?F (36.7 ?C) 97.7 ?F (36.5 ?C) 97.6 ?F (36.4 ?C)  ?TempSrc:   Oral Oral  ?SpO2: 100% 99% 99% 96%  ?Weight:      ?Height:      ? ? ?Intake/Output Summary (Last 24 hours) at 01/15/2022 1106 ?Last data filed at 01/15/2022 0900 ?Gross per 24 hour  ?Intake 3007.57 ml  ?Output 1800 ml  ?Net 1207.57 ml  ? ?Filed Weights  ? 01/14/22 1057  ?Weight: 59.2 kg  ? ? ?Examination: ? ?General exam: Appears calm and comfortable  ?Frail and debilitated.  Cachectic.  Older than his stated age. ?Patient has some skin ecchymosis from fall. ?Respiratory system: Clear to auscultation. Respiratory effort normal.  No added sounds. ?Cardiovascular system: S1 & S2 heard, RRR.  No edema. ?Gastrointestinal system: Abdomen is nondistended, soft and nontender. No organomegaly or masses felt. Normal bowel sounds heard. ?Central nervous system: Alert and oriented.  Right hemiparesis  but able to use his arms. ? ? ?Data Reviewed: I have personally reviewed following labs and imaging studies ? ?CBC: ?Recent Labs  ?Lab 01/09/22 ?0422 01/14/22 ?[id number] 01/14/22 ?0045 01/14/22 ?[id number] 01/15/22 ?0202 01/15/22 ?0316  ?WBC 10.0 18.7*  --   --  9.6  --   ?NEUTROABS  --  16.2*  --   --  6.9  --    ?HGB 12.2* 13.4 16.0 15.3 9.6* 9.7*  ?HCT 36.8* 41.2 47.0 45.0 28.7* 28.5*  ?MCV 92.2 97.9  --   --  93.5  --   ?PLT 270 382  --   --  261  --   ? ?Basic Metabolic Panel: ?Recent Labs  ?Lab 01/09/22 ?0422 01/14/22 ?[id number] 01/14/22 ?0045 01/14/22 ?[id number] 01/14/22 ?0402 01/14/22 ?0720 01/15/22 ?0202  ?NA 132* 136 134* 136 140 136 132*  ?K 3.6 4.9 4.7 4.7 3.9 5.1 3.4*  ?CL 100 98  --  102 107 107 103  ?CO2 25 12*  --   --  21* 17* 25  ?GLUCOSE 274* 531*  --  518* 141* 353* 60*  ?BUN 20 32*  --  34* 28* 25* 16  ?CREATININE 0.52* 1.44*  --  0.70 1.05 0.73 0.49*  ?CALCIUM 8.9 9.9  --   --  8.7* 8.2* 8.2*  ?MG  --   --   --   --   --   --  1.3*  ?PHOS  --   --   --   --   --   --  1.9*  ? ?GFR: ?Estimated Creatinine Clearance: 85.3 mL/min (A) (by C-G formula based on SCr of 0.49 mg/dL (L)). ?Liver Function Tests: ?Recent Labs  ?Lab 01/14/22 ?[id number] 01/15/22 ?0202  ?AST 41 34  ?ALT 40 31  ?ALKPHOS 127* 67  ?BILITOT 1.6* 0.3  ?PROT 7.7 4.9*  ?ALBUMIN 4.0 2.4*  ? ?No results for input(s): LIPASE, AMYLASE in the last 168 hours. ?No results for input(s): AMMONIA in the last 168 hours. ?Coagulation Profile: ?Recent Labs  ?Lab 01/15/22 ?0502  ?INR 0.9  ? ?Cardiac Enzymes: ?Recent Labs  ?Lab 01/09/22 ?0422 01/14/22 ?[id number] 01/14/22 ?0402 01/15/22 ?0202  ?CKTOTAL 450* 1,094* 851* 465*  ? ?BNP (last 3 results) ?No results for input(s): PROBNP in the last 8760 hours. ?HbA1C: ?No results for input(s): HGBA1C in the last 72 hours. ?CBG: ?Recent Labs  ?Lab 01/14/22 ?2100 01/15/22 ?0450 01/15/22 ?[id number] 01/15/22 ?[id number] 01/15/22 ?0753  ?GLUCAP 145* 69* 63* 145* 176*  ? ?Lipid Profile: ?No results for input(s): CHOL, HDL, LDLCALC, TRIG, CHOLHDL, LDLDIRECT in the last 72 hours. ?Thyroid Function Tests: ?Recent Labs  ?  01/14/22 ?0402  ?TSH 0.515  ? ?Anemia Panel: ?Recent Labs  ?  01/14/22 ?0402 01/15/22 ?0502  ?VITAMINB12 1,001*  --   ?FOLATE  --  16.1  ?FERRITIN  --  104  ?TIBC  --  235*  ?IRON  --  33*  ?RETICCTPCT  --  1.8  ? ?Sepsis Labs: ?No  results for input(s): PROCALCITON, LATICACIDVEN in the last 168 hours. ? ?Recent Results (from the past 240 hour(s))  ?MRSA Next Gen by PCR, Nasal     Status: None  ? Collection Time: 01/07/22  8:37 PM  ? Specimen: Nasal Mucosa; Nasal Swab  ?Result Value Ref Range Status  ? MRSA by PCR Next Gen NOT DETECTED NOT DETECTED Final  ?  Comment: (NOTE) ?The GeneXpert MRSA Assay (FDA approved for NASAL specimens only), ?is one component of a comprehensive MRSA colonization surveillance ?program.  It is not intended to diagnose MRSA infection nor to guide ?or monitor treatment for MRSA infections. ?Test performance is not FDA approved in patients less than 2 years ?old. ?Performed at [facility], [address] [place], ?[place] [address] ?  ?  ? ? ? ? ? ?Radiology Studies: ?DG Chest Portable 1 View ? ?Result Date: 01/14/2022 ?CLINICAL DATA:  Fall, left shoulder pain EXAM: PORTABLE CHEST 1 VIEW COMPARISON:  01/07/2022 FINDINGS: Heart and mediastinal contours are within normal limits. No focal opacities or effusions. No acute bony abnormality. IMPRESSION: No active disease. Electronically Signed   By: Charlett Nose M.D.   On: 01/14/2022 01:04  ? ?DG Shoulder Left ? ?Result Date: 01/14/2022 ?CLINICAL DATA:  Fall, left shoulder pain EXAM: LEFT SHOULDER - 2+ VIEW COMPARISON:  None. FINDINGS: There is no evidence of fracture or dislocation. There is no evidence of arthropathy or other focal bone abnormality. Soft tissues are unremarkable. IMPRESSION: Negative. Electronically Signed   By: Charlett Nose M.D.   On: 01/14/2022 01:04  ? ?DG HIP UNILAT W OR W/O PELVIS 2-3 VIEWS RIGHT ? ?Result Date: 01/14/2022 ?CLINICAL DATA:  Fall, right hip pain EXAM: DG HIP (WITH OR WITHOUT PELVIS) 2-3V RIGHT COMPARISON:  None. FINDINGS: No acute bony abnormality. Specifically, no fracture, subluxation, or dislocation. Hip joints and SI joints symmetric. IMPRESSION: No acute bony abnormality. Electronically Signed   By: Charlett Nose M.D.   On:  01/14/2022 01:05   ? ? ? ? ? ?Scheduled Meds: ? aspirin EC  81 mg Oral Daily  ? busPIRone  10 mg Oral BID  ? clopidogrel  75 mg Oral Daily  ? enoxaparin (LOVENOX) injection  40 mg Subcutaneous Q24H  ? famo

## 2022-01-15 NOTE — Progress Notes (Signed)
Hypoglycemic Event ? ?CBG: 69 at 0513  ? ?Treatment: 4 oz juice/soda ? ?Symptoms: None ? ?Follow-up CBG: Time: 0549 CBG Result: 63 - asymptomatic  ?Drank OJ and ate graham crackers and peanut butter ? ?Follow up CBG  176 at 0753  ? ?Possible Reasons for Event: unknown  ? ?Comments/MD notified: Dr. Myra Gianotti notified.  ? ? ? ?Brandon [name] ? ? ?

## 2022-01-15 NOTE — Telephone Encounter (Signed)
Transition Care Management Unsuccessful Follow-up Telephone Call ? ?Date of discharge and from where:  01/09/2022-[facility] ? ?Attempts:  3rd Attempt ? ?Reason for unsuccessful TCM follow-up call:  Unable to reach patient ? ? ? ?

## 2022-01-15 NOTE — Progress Notes (Signed)
Inpatient Diabetes Program Recommendations ? ?AACE/ADA: New Consensus Statement on Inpatient Glycemic Control (2015) ? ?Target Ranges:  Prepandial:   less than 140 mg/dL ?     Peak postprandial:   less than 180 mg/dL (1-2 hours) ?     Critically ill patients:  140 - 180 mg/dL  ? ?Lab Results  ?Component Value Date  ? GLUCAP 176 (H) 01/15/2022  ? HGBA1C 13.1 (H) 01/08/2022  ? ? ?Review of Glycemic Control ? Latest Reference Range & Units 01/14/22 12:21 01/14/22 17:43 01/14/22 21:00 01/15/22 04:50 01/15/22 05:13 01/15/22 05:49 01/15/22 07:53  ?Glucose-Capillary 70 - 99 mg/dL 376 (H) 283 (H) 151 (H) 69 (L) 63 (L) 145 (H) 176 (H)  ? ?Diabetes history: DM 2 ?Outpatient Diabetes medications:  ?Levemir 25 units q HS ?Novolog 6 units tid with meals ?Metformin 1000 mg bid ?Current orders for Inpatient glycemic control:  ?Novolog sensitive tid with meals and HS ?Novolog 3 units tid with meals ?Levemir 18 units daily ? ?Inpatient Diabetes Program Recommendations:   ? ?Agree with current orders.  Will follow.  ? ?Thanks,  ?Beryl Meager, RN, BC-ADM ?Inpatient Diabetes Coordinator ?Pager [phone]  (8a-5p) ? ? ?

## 2022-01-16 DIAGNOSIS — E43 Unspecified severe protein-calorie malnutrition: Secondary | ICD-10-CM | POA: Diagnosis not present

## 2022-01-16 DIAGNOSIS — T796XXA Traumatic ischemia of muscle, initial encounter: Secondary | ICD-10-CM | POA: Diagnosis not present

## 2022-01-16 DIAGNOSIS — I6930 Unspecified sequelae of cerebral infarction: Secondary | ICD-10-CM | POA: Diagnosis not present

## 2022-01-16 DIAGNOSIS — E1010 Type 1 diabetes mellitus with ketoacidosis without coma: Secondary | ICD-10-CM | POA: Diagnosis not present

## 2022-01-16 LAB — CBC WITH DIFFERENTIAL/PLATELET
Abs Immature Granulocytes: 0.04 10*3/uL (ref 0.00–0.07)
Basophils Absolute: 0 10*3/uL (ref 0.0–0.1)
Basophils Relative: 0 %
Eosinophils Absolute: 0.1 10*3/uL (ref 0.0–0.5)
Eosinophils Relative: 1 %
HCT: 28.6 % — ABNORMAL LOW (ref 39.0–52.0)
Hemoglobin: 9.6 g/dL — ABNORMAL LOW (ref 13.0–17.0)
Immature Granulocytes: 1 %
Lymphocytes Relative: 22 %
Lymphs Abs: 1.5 10*3/uL (ref 0.7–4.0)
MCH: 31.6 pg (ref 26.0–34.0)
MCHC: 33.6 g/dL (ref 30.0–36.0)
MCV: 94.1 fL (ref 80.0–100.0)
Monocytes Absolute: 0.7 10*3/uL (ref 0.1–1.0)
Monocytes Relative: 9 %
Neutro Abs: 4.6 10*3/uL (ref 1.7–7.7)
Neutrophils Relative %: 67 %
Platelets: 272 10*3/uL (ref 150–400)
RBC: 3.04 MIL/uL — ABNORMAL LOW (ref 4.22–5.81)
RDW: 14.6 % (ref 11.5–15.5)
WBC: 7 10*3/uL (ref 4.0–10.5)
nRBC: 0 % (ref 0.0–0.2)

## 2022-01-16 LAB — BASIC METABOLIC PANEL
Anion gap: 4 — ABNORMAL LOW (ref 5–15)
BUN: 10 mg/dL (ref 6–20)
CO2: 28 mmol/L (ref 22–32)
Calcium: 8.2 mg/dL — ABNORMAL LOW (ref 8.9–10.3)
Chloride: 103 mmol/L (ref 98–111)
Creatinine, Ser: 0.54 mg/dL — ABNORMAL LOW (ref 0.61–1.24)
GFR, Estimated: >60 mL/min (ref >60)
Glucose, Bld: 88 mg/dL (ref 70–99)
Potassium: 3.7 mmol/L (ref 3.5–5.1)
Sodium: 135 mmol/L (ref 135–145)

## 2022-01-16 LAB — GLUCOSE, CAPILLARY
Glucose-Capillary: 92 mg/dL (ref 70–99)
Glucose-Capillary: 275 mg/dL — ABNORMAL HIGH (ref 70–99)
Glucose-Capillary: 306 mg/dL — ABNORMAL HIGH (ref 70–99)
Glucose-Capillary: 173 mg/dL — ABNORMAL HIGH (ref 70–99)

## 2022-01-16 LAB — MAGNESIUM: Magnesium: 1.8 mg/dL (ref 1.7–2.4)

## 2022-01-16 LAB — PHOSPHORUS: Phosphorus: 3 mg/dL (ref 2.5–4.6)

## 2022-01-16 MED ORDER — ADULT MULTIVITAMIN W/MINERALS CH
1.0000 | ORAL_TABLET | Freq: Every day | ORAL | Status: DC
  Administered 2022-01-16 – 2022-01-17 (×2): 1 via ORAL
  Filled 2022-01-16 (×2): qty 1

## 2022-01-16 MED ORDER — ENSURE ENLIVE PO LIQD
237.0000 mL | Freq: Three times a day (TID) | ORAL | Status: DC
  Administered 2022-01-16 – 2022-01-17 (×4): 237 mL via ORAL

## 2022-01-16 NOTE — Progress Notes (Signed)
Occupational Therapy Treatment ?Patient Details ?Name: Brandon [name] ?MRN: [id number] ?DOB: 21-Oct-1965 ?Today's Date: 01/16/2022 ? ? ?History of present illness Pt is a 56 y/o male presenting to [facility] ED on 4/23 with falls, L shoulder pain. Imaging negative. Found with DKA and rhabdomyolysis.  PMH includes: recent hospital admission, alcohol abuse in remission, CAD, CVA, insulin dependent DM, HTN, malnutrition. ?  ?OT comments ? Pt progressing well towards OT goals.  Completing transfers with min assist and functional mobility using RW with min assist today, stood at sink to engage in grooming tasks with min guard.  Remains limited by decreased cognition, including impaired attention, recall, problem solving and safety.  Continue to recommend SNF at this time.   ? ?Recommendations for follow up therapy are one component of a multi-disciplinary discharge planning process, led by the attending physician.  Recommendations may be updated based on patient status, additional functional criteria and insurance authorization. ?   ?Follow Up Recommendations ? Skilled nursing-short term rehab (<3 hours/day)  ?  ?Assistance Recommended at Discharge Frequent or constant Supervision/Assistance  ?Patient can return home with the following ? A little help with walking and/or transfers;A lot of help with bathing/dressing/bathroom;Direct supervision/assist for medications management;Direct supervision/assist for financial management;Assistance with cooking/housework;Assist for transportation;Help with stairs or ramp for entrance ?  ?Equipment Recommendations ? BSC/3in1  ?  ?Recommendations for Other Services   ? ?  ?Precautions / Restrictions Precautions ?Precautions: Fall ?Restrictions ?Weight Bearing Restrictions: No  ? ? ?  ? ?Mobility Bed Mobility ?Overal bed mobility: Needs Assistance ?Bed Mobility: Supine to Sit ?  ?  ?Supine to sit: Min guard ?  ?  ?General bed mobility comments: min guard with max verbal cueing for technique,  completion of task ?  ? ?Transfers ?  ?  ?  ?  ?  ?  ?  ?  ?  ?  ?  ?  ?Balance Overall balance assessment: Needs assistance ?Sitting-balance support: No upper extremity supported, Feet supported ?Sitting balance-Leahy Scale: Fair ?  ?  ?Standing balance support: Bilateral upper extremity supported, No upper extremity supported, During functional activity ?Standing balance-Leahy Scale: Fair ?Standing balance comment: relies on RW dynamically ?  ?  ?  ?  ?  ?  ?  ?  ?  ?  ?  ?   ? ?ADL either performed or assessed with clinical judgement  ? ?ADL Overall ADL's : Needs assistance/impaired ?  ?  ?Grooming: Wash/dry face;Oral care;Min guard;Standing ?Grooming Details (indicate cue type and reason): standing at sink, requires cueing for sequencing, technique and completion of task. Educated on compensatory techniques due to R shoulder pain and decreased functional use ?  ?  ?  ?  ?  ?  ?  ?  ?Toilet Transfer: Minimal assistance;Ambulation;Rolling walker (2 wheels) ?  ?  ?  ?  ?  ?Functional mobility during ADLs: Minimal assistance;Rolling walker (2 wheels);Cueing for sequencing;Cueing for safety ?  ?  ? ?Extremity/Trunk Assessment   ?  ?  ?  ?  ?  ? ?Vision   ?  ?  ?Perception   ?  ?Praxis   ?  ? ?Cognition Arousal/Alertness: Awake/alert ?Behavior During Therapy: Flat affect ?Overall Cognitive Status: Impaired/Different from baseline ?Area of Impairment: Following commands, Safety/judgement, Awareness, Problem solving, Memory, Attention ?  ?  ?  ?  ?  ?  ?  ?  ?  ?Current Attention Level: Sustained ?Memory: Decreased recall of precautions, Decreased short-term memory ?Following Commands: Follows one  step commands consistently, Follows one step commands with increased time, Follows multi-step commands inconsistently ?Safety/Judgement: Decreased awareness of safety, Decreased awareness of deficits ?Awareness: Emergent ?Problem Solving: Slow processing, Decreased initiation, Requires verbal cues, Difficulty sequencing,  Requires tactile cues ?General Comments: pt with poor awareness for safety and defcitis, self limiting with poor recall, sequencing and safety awareness ?  ?  ?   ?Exercises   ? ?  ?Shoulder Instructions   ? ? ?  ?General Comments Engaged in PROM and stretch to R UE, reports shoulder pain and noted pt holding R UE in shoulder elevation.  Increased PROM with mobilization but continues to present with generalized weakness.  ? ? ?Pertinent Vitals/ Pain       Pain Assessment ?Pain Assessment: Faces ?Faces Pain Scale: Hurts a little bit ?Pain Location: R hip ?Pain Descriptors / Indicators: Discomfort, Aching ?Pain Intervention(s): Limited activity within patient's tolerance, Monitored during session, Repositioned ? ?Home Living   ?  ?  ?  ?  ?  ?  ?  ?  ?  ?  ?  ?  ?  ?  ?  ?  ?  ?  ? ?  ?Prior Functioning/Environment    ?  ?  ?  ?   ? ?Frequency ? Min 2X/week  ? ? ? ? ?  ?Progress Toward Goals ? ?OT Goals(current goals can now be found in the care plan section) ? Progress towards OT goals: Progressing toward goals ? ?Acute Rehab OT Goals ?Patient Stated Goal: less pain ?OT Goal Formulation: With patient ?Time For Goal Achievement: 01/28/22 ?Potential to Achieve Goals: Good  ?Plan Discharge plan remains appropriate;Frequency remains appropriate   ? ?Co-evaluation ? ? ? PT/OT/SLP Co-Evaluation/Treatment: Yes ?Reason for Co-Treatment: For patient/therapist safety;To address functional/ADL transfers;Necessary to address cognition/behavior during functional activity ?  ?OT goals addressed during session: ADL's and self-care ?  ? ?  ?AM-PAC OT "6 Clicks" Daily Activity     ?Outcome Measure ? ? Help from another person eating meals?: A Little ?Help from another person taking care of personal grooming?: A Little ?Help from another person toileting, which includes using toliet, bedpan, or urinal?: A Lot ?Help from another person bathing (including washing, rinsing, drying)?: A Lot ?Help from another person to put on and taking  off regular upper body clothing?: A Little ?Help from another person to put on and taking off regular lower body clothing?: A Lot ?6 Click Score: 15 ? ?  ?End of Session Equipment Utilized During Treatment: Gait belt;Rolling walker (2 wheels) ? ?OT Visit Diagnosis: Other abnormalities of gait and mobility (R26.89);Muscle weakness (generalized) (M62.81);Pain;Other symptoms and signs involving cognitive function;History of falling (Z91.81) ?Pain - Right/Left:  (bil) ?Pain - part of body: Shoulder;Hip ?  ?Activity Tolerance Patient tolerated treatment well ?  ?Patient Left in chair;with call bell/phone within reach;with chair alarm set ?  ?Nurse Communication Mobility status;Other (comment) (OOB for meals, pt hungry and asking for ensure (notified of pt reporting chewing on his thumb bc hes is "hungry")) ?  ? ?   ? ?Time: [id number] ?OT Time Calculation (min): 29 min ? ?Charges: OT General Charges ?$OT Visit: 1 Visit ?OT Treatments ?$Self Care/Home Management : 8-22 mins ? ?Brandon [name], OT ?Acute Rehabilitation Services ?Pager [phone] ?Office [phone] ? ? ?Delight Stare ?01/16/2022, 9:30 AM ?

## 2022-01-16 NOTE — Progress Notes (Signed)
?PROGRESS NOTE ? ? ? ?Brandon [name]  [id number] DOB: 1966-01-27 DOA: 01/13/2022 ?PCP: Remi Haggard, FNP  ? ? ?Brief Narrative:  ?56 year old chronically sick gentleman with history of stroke and right hemiparesis, insulin-dependent type 2 diabetes, hypertension and malnutrition presented with recurrent fall, unable to get off the floor and left shoulder pain.  Uses four-wheel walker at home, has chronic numbness and tingling of the feet.  Recently admitted to [facility] hospital with hyperglycemia, treated with insulin and discharged home on increased dose of insulin.  In the emergency room afebrile, tachycardic, systolic blood pressure 98.  Blood glucose 531, anion gap 26.  Leukocytosis 18,000.  Skeletal survey was negative.  Patient was given IV fluid boluses and started on insulin infusion and admitted ? ? ?Assessment & Plan: ?  ?Diabetic ketoacidosis in a patient with type 2 diabetes, uncontrolled with hyperglycemia: ?Treated with aggressive IV fluid and IV insulin and ultimately converted to subcu insulin. ?Home regimen Levemir 25 units, NovoLog 4 units with meals. ?4/25, early morning hypoglycemic with blood sugar 63.  Improved now after decreasing long-acting insulin. ?Change Levemir to 18 units once a day, continue prandial insulin 4 units with meals.  Recent known A1c of 13.  Brittle diabetes, definitely some evidence of noncompliance. ?Inconsistent dietary intake. ? ?Electrolytes, hypomagnesemia, hypophosphatemia, hypokalemia: Severe and persistent.  ?Replaced and adequate.   ? ?Deconditioning, frailty and debility: History of stroke, no new neurological deficits.  Currently on aspirin Plavix and statin that is continued. ?Significantly debilitated.  Will benefit with PT OT and inpatient therapies before going home. ? ?Traumatic rhabdomyolysis: With frequent fall.  Treated with IV fluids.  Improved.  Discontinue.  Encourage oral intake. ? ?Leukocytosis: No evidence of infection.   Improved. ? ?Moderate protein calorie malnutrition: Due to chronic medical issues.  Augment nutrition. ?  ?  ?  ?Stable for transfer to skilled nursing rehab level of care when bed is available. ? ? ?DVT prophylaxis: enoxaparin (LOVENOX) injection 40 mg Start: 01/14/22 1000 ? ? ?Code Status: Full code ?Family Communication: None ?Disposition Plan: Status is: Inpatient ?Remains inpatient appropriate because: Unsafe discharge ?  ? ? ?Consultants:  ?None ? ?Procedures:  ?None ? ?Antimicrobials:  ?None ? ? ?Subjective: ? ?Seen and examined.  No overnight events.  Blood sugars stable.  Hungry again after eating breakfast.  Agreeable to go to rehab. ? ?Objective: ?Vitals:  ? 01/15/22 1645 01/15/22 2049 01/16/22 0513 01/16/22 0945  ?BP: (!) 137/105 118/72 108/66 129/80  ?Pulse: 63 62 (!) 58 65  ?Resp: 14 18 18 12   ?Temp: 97.7 ?F (36.5 ?C) 97.9 ?F (36.6 ?C) (!) 97.5 ?F (36.4 ?C) 97.6 ?F (36.4 ?C)  ?TempSrc: Oral Oral Oral Oral  ?SpO2: 100% 99% 96% 99%  ?Weight:      ?Height:      ? ? ?Intake/Output Summary (Last 24 hours) at 01/16/2022 1029 ?Last data filed at 01/16/2022 [id number] ?Gross per 24 hour  ?Intake 986.73 ml  ?Output 1600 ml  ?Net -613.27 ml  ? ?Filed Weights  ? 01/14/22 1057  ?Weight: 59.2 kg  ? ? ?Examination: ? ?General exam: Appears calm and comfortable.  Frail and debilitated.  Skin bruises on the lateral right arm. ?Respiratory system: Clear to auscultation. Respiratory effort normal.  No added sounds. ?Cardiovascular system: S1 & S2 heard, RRR.  No edema. ?Gastrointestinal system: Abdomen is nondistended, soft and nontender. No organomegaly or masses felt. Normal bowel sounds heard. ?Central nervous system: Alert and oriented.  Right hemiparesis but able  to use his arms. ? ? ?Data Reviewed: I have personally reviewed following labs and imaging studies ? ?CBC: ?Recent Labs  ?Lab 01/14/22 ?[id number] 01/14/22 ?0045 01/14/22 ?[id number] 01/15/22 ?0202 01/15/22 ?[id number] 01/16/22 ?0319  ?WBC 18.7*  --   --  9.6  --  7.0  ?NEUTROABS  16.2*  --   --  6.9  --  4.6  ?HGB 13.4 16.0 15.3 9.6* 9.7* 9.6*  ?HCT 41.2 47.0 45.0 28.7* 28.5* 28.6*  ?MCV 97.9  --   --  93.5  --  94.1  ?PLT 382  --   --  261  --  272  ? ?Basic Metabolic Panel: ?Recent Labs  ?Lab 01/14/22 ?[id number] 01/14/22 ?0045 01/14/22 ?[id number] 01/14/22 ?0402 01/14/22 ?0720 01/15/22 ?0202 01/16/22 ?0319  ?NA 136   < > 136 140 136 132* 135  ?K 4.9   < > 4.7 3.9 5.1 3.4* 3.7  ?CL 98  --  102 107 107 103 103  ?CO2 12*  --   --  21* 17* 25 28  ?GLUCOSE 531*  --  518* 141* 353* 60* 88  ?BUN 32*  --  34* 28* 25* 16 10  ?CREATININE 1.44*  --  0.70 1.05 0.73 0.49* 0.54*  ?CALCIUM 9.9  --   --  8.7* 8.2* 8.2* 8.2*  ?MG  --   --   --   --   --  1.3* 1.8  ?PHOS  --   --   --   --   --  1.9* 3.0  ? < > = values in this interval not displayed.  ? ?GFR: ?Estimated Creatinine Clearance: 85.3 mL/min (A) (by C-G formula based on SCr of 0.54 mg/dL (L)). ?Liver Function Tests: ?Recent Labs  ?Lab 01/14/22 ?[id number] 01/15/22 ?0202  ?AST 41 34  ?ALT 40 31  ?ALKPHOS 127* 67  ?BILITOT 1.6* 0.3  ?PROT 7.7 4.9*  ?ALBUMIN 4.0 2.4*  ? ?No results for input(s): LIPASE, AMYLASE in the last 168 hours. ?No results for input(s): AMMONIA in the last 168 hours. ?Coagulation Profile: ?Recent Labs  ?Lab 01/15/22 ?0502  ?INR 0.9  ? ?Cardiac Enzymes: ?Recent Labs  ?Lab 01/14/22 ?[id number] 01/14/22 ?0402 01/15/22 ?0202  ?CKTOTAL 1,094* 851* 465*  ? ?BNP (last 3 results) ?No results for input(s): PROBNP in the last 8760 hours. ?HbA1C: ?No results for input(s): HGBA1C in the last 72 hours. ?CBG: ?Recent Labs  ?Lab 01/15/22 ?0753 01/15/22 ?1130 01/15/22 ?1651 01/15/22 ?2052 01/16/22 ?[id number]  ?GLUCAP 176* 434* 106* 148* 92  ? ?Lipid Profile: ?No results for input(s): CHOL, HDL, LDLCALC, TRIG, CHOLHDL, LDLDIRECT in the last 72 hours. ?Thyroid Function Tests: ?Recent Labs  ?  01/14/22 ?0402  ?TSH 0.515  ? ?Anemia Panel: ?Recent Labs  ?  01/14/22 ?0402 01/15/22 ?0502  ?VITAMINB12 1,001*  --   ?FOLATE  --  16.1  ?FERRITIN  --  104  ?TIBC  --  235*  ?IRON   --  33*  ?RETICCTPCT  --  1.8  ? ?Sepsis Labs: ?No results for input(s): PROCALCITON, LATICACIDVEN in the last 168 hours. ? ?Recent Results (from the past 240 hour(s))  ?MRSA Next Gen by PCR, Nasal     Status: None  ? Collection Time: 01/07/22  8:37 PM  ? Specimen: Nasal Mucosa; Nasal Swab  ?Result Value Ref Range Status  ? MRSA by PCR Next Gen NOT DETECTED NOT DETECTED Final  ?  Comment: (NOTE) ?The GeneXpert MRSA Assay (FDA approved for NASAL specimens only), ?is one component of  a comprehensive MRSA colonization surveillance ?program. It is not intended to diagnose MRSA infection nor to guide ?or monitor treatment for MRSA infections. ?Test performance is not FDA approved in patients less than 2 years ?old. ?Performed at [facility], [place], ?[place] [address] ?  ?  ? ? ? ? ? ?Radiology Studies: ?No results found. ? ? ? ? ? ?Scheduled Meds: ? aspirin EC  81 mg Oral Daily  ? busPIRone  10 mg Oral BID  ? clopidogrel  75 mg Oral Daily  ? enoxaparin (LOVENOX) injection  40 mg Subcutaneous Q24H  ? famotidine  10 mg Oral BID  ? insulin aspart  0-5 Units Subcutaneous QHS  ? insulin aspart  0-9 Units Subcutaneous TID WC  ? insulin aspart  3 Units Subcutaneous TID WC  ? insulin detemir  18 Units Subcutaneous Daily  ? leptospermum manuka honey  1 application. Topical Daily  ? simvastatin  20 mg Oral q1800  ? traZODone  150 mg Oral QHS  ? ?Continuous Infusions: ? ? ? ? LOS: 2 days  ? ? ?Time spent: 35 minutes ? ? ? ?Barb Merino, MD ?[facility] ?Pager [phone] ? ?

## 2022-01-16 NOTE — Progress Notes (Signed)
Initial Nutrition Assessment ? ?DOCUMENTATION CODES:  ? ?Underweight, Severe malnutrition in context of chronic illness ? ?INTERVENTION:  ? ?Continue Carb Mod diet for now; consider liberalizing on follow-up pending po intake, blood sugar control ? ?Allow double portions of protein, non-starchy veggies ? ?Ensure Enlive po TID between meals, each supplement provides 350 kcal and 20 grams of protein. Pt needs the increased kcals and protein that the Ensure provides.  ? ?Add MVI with Minerals daily ? ?Monitor magnesium, potassium, and phosphorus for at least 3 days, MD to replete as needed, as pt is at risk for refeeding syndrome given severe malnutrition with severe weight loss. ? ?Pt will likely continue to lose weight until DM better controlled ? ? ?NUTRITION DIAGNOSIS:  ? ?Severe Malnutrition related to chronic illness as evidenced by severe fat depletion, severe muscle depletion. ? ?GOAL:  ? ?Patient will meet greater than or equal to 90% of their needs ? ?MONITOR:  ? ?PO intake, Supplement acceptance, Weight trends, Labs ? ?REASON FOR ASSESSMENT:  ? ?Consult ?Assessment of nutrition requirement/status ? ?ASSESSMENT:  ? ? 56 yo male admitted with DKA, eletcrolyte abnormalities, deconditioning/FTT. PMH includes stroke with right hemiparesis, insulin-dependent Type 2 DM, chronic numbness and tingling of feet.  ? ?Pt reports he is "starving" and reports he is not getting enough to eat. Pt reports he has felt the same way at home. He is eating and eating and eating and still losing weight. Discussed with pt that this phenomenon is likely due to his poorly controlled DM. Pt requiring insulin now and if he does not have the correct amount of insulin, his body is unable to utilize the energy from carbohydrates he is consuming, hense the high CBGs at home and weight loss. Pt has a Dexcom and reports he checks his sugars at home and they are always high. Pt reports he uses oral supplement like Boost at home as well.   ? ?Noted hypophosphatemia, hypokalemia and hypomagnesemia. Supplemented and normal this AM; would continue to trend given concern for refeeding.  ? ?Pt reports UBW around 190-200 pounds; current wt 130 pounds. >30% wt loss ? ?CBGs all over the place; CBG range 63-434. DM coordinator following. Pt reports he takes metformin at home and also gives himself insulin.  ?Lab Results  ?Component Value Date  ? HGBA1C 13.1 (H) 01/08/2022  ? ?Noted plan for discharge to SNF-short term rehab, [facility]. Hopefully he will be able to manage better blood sugar control while in facility.  ? ?Vitamin Labs: No CRP ?B12: 1001 (H) ? ?Labs: CBGs 63-434 ?Meds: ss novolog, novolog with meals, levemir ? ?NUTRITION - FOCUSED PHYSICAL EXAM: ? ?Flowsheet Row Most Recent Value  ?Orbital Region Severe depletion  ?Upper Arm Region Severe depletion  ?Thoracic and Lumbar Region Severe depletion  ?Buccal Region Severe depletion  ?Temple Region Severe depletion  ?Clavicle Bone Region Severe depletion  ?Clavicle and Acromion Bone Region Severe depletion  ?Scapular Bone Region Severe depletion  ?Dorsal Hand Severe depletion  ?Patellar Region Severe depletion  ?Anterior Thigh Region Severe depletion  ?Posterior Calf Region Severe depletion  ?Edema (RD Assessment) None  ? ?  ? ? ?Diet Order:   ?Diet Order   ? ?       ?  Diet Carb Modified Fluid consistency: Thin; Room service appropriate? Yes  Diet effective now       ?  ? ?  ?  ? ?  ? ? ?EDUCATION NEEDS:  ? ?Education needs have been addressed ? ?  Skin:  Skin Integrity Issues:: Stage II, Unstageable ?Stage II: coccyx, hip ?Unstageable: knee ? ?Last BM:  PTA ? ?Height:  ? ?Ht Readings from Last 1 Encounters:  ?01/14/22 6\' 1"  (1.854 m)  ? ? ?Weight:  ? ?Wt Readings from Last 1 Encounters:  ?01/14/22 59.2 kg  ? ? ?BMI:  Body mass index is 17.22 kg/m?. ? ?Estimated Nutritional Needs:  ? ?Kcal:  2300-2500 kcals ? ?Protein:  125-140 g ? ?Fluid:  >/= 2L ? ? ?Kerman Passey MS, RDN, LDN, CNSC ?Registered  Dietitian III ?Clinical Nutrition ?RD Pager and On-Call Pager Number Located in [place]  ? ?
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
## 2022-01-16 NOTE — Progress Notes (Signed)
Physical Therapy Treatment ?Patient Details ?Name: Brandon [name] ?MRN: [id number] ?DOB: 05-May-1966 ?Today's Date: 01/16/2022 ? ? ?History of Present Illness Pt is a 56 y/o male presenting [facility] ED on 4/[facility] with falls, L shoulder pain. Imaging negative. Found with DKA and rhabdomyolysis.  PMH includes: recent hospital admission, alcohol abuse in remission, CAD, CVA, insulin dependent DM, HTN, malnutrition. ? ?  ?PT Comments  ? ? Patient making progress towards physical therapy goals. Patient increased ambulation distance to 100' with RW and minA + chair follow. Patient complaining of R hip pain during mobility. Patient was able to stand at sink with no UE support and perform ADL with min guard. He continues to be limited by weakness, endurance, and cognitive deficits in processing and sequencing. Continue to recommend SNF for ongoing Physical Therapy.    ?   ?Recommendations for follow up therapy are one component of a multi-disciplinary discharge planning process, led by the attending physician.  Recommendations may be updated based on patient status, additional functional criteria and insurance authorization. ? ?Follow Up Recommendations ? Skilled nursing-short term rehab (<3 hours/day) ?  ?  ?Assistance Recommended at Discharge Frequent or constant Supervision/Assistance  ?Patient can return home with the following Assistance with cooking/housework;Direct supervision/assist for medications management;Direct supervision/assist for financial management;Assist for transportation;Help with stairs or ramp for entrance;A little help with walking and/or transfers;A little help with bathing/dressing/bathroom ?  ?Equipment Recommendations ? Rolling Raidyn Wassink (2 wheels);BSC/3in1  ?  ?Recommendations for Other Services   ? ? ?  ?Precautions / Restrictions Precautions ?Precautions: Fall ?Restrictions ?Weight Bearing Restrictions: No  ?  ? ?Mobility ? Bed Mobility ?Overal bed mobility: Needs Assistance ?Bed Mobility: Supine to  Sit ?  ?  ?Supine to sit: Min guard ?  ?  ?General bed mobility comments: min guard with max verbal cueing for technique, completion of task. Increased time to complete ?  ? ?Transfers ?Overall transfer level: Needs assistance ?Equipment used: Rolling Jullianna Gabor (2 wheels) ?Transfers: Sit to/from Stand ?Sit to Stand: Min assist, +2 safety/equipment ?  ?  ?  ?  ?  ?General transfer comment: minA to rise from EOB and recliner. +2 for safety ?  ? ?Ambulation/Gait ?Ambulation/Gait assistance: Min assist, +2 safety/equipment ?Gait Distance (Feet): 100 Feet ?Assistive device: Rolling Cassey Hurrell (2 wheels) ?Gait Pattern/deviations: Step-through pattern, Decreased stride length, Trunk flexed ?Gait velocity: decreased ?  ?  ?General Gait Details: minA for balance and at times RW management. Cues for close RW proximity. +2 for chair follow ? ? ?Stairs ?  ?  ?  ?  ?  ? ? ?Wheelchair Mobility ?  ? ?Modified Rankin (Stroke Patients Only) ?  ? ? ?  ?Balance Overall balance assessment: Needs assistance ?Sitting-balance support: No upper extremity supported, Feet supported ?Sitting balance-Leahy Scale: Fair ?  ?  ?Standing balance support: Bilateral upper extremity supported, No upper extremity supported, During functional activity ?Standing balance-Leahy Scale: Fair ?Standing balance comment: relies on RW dynamically but able to static stand with no UE support at sink ?  ?  ?  ?  ?  ?  ?  ?  ?  ?  ?  ?  ? ?  ?Cognition Arousal/Alertness: Awake/alert ?Behavior During Therapy: Flat affect ?Overall Cognitive Status: Impaired/Different from baseline ?Area of Impairment: Following commands, Safety/judgement, Awareness, Problem solving, Memory, Attention ?  ?  ?  ?  ?  ?  ?  ?  ?  ?Current Attention Level: Sustained ?Memory: Decreased recall of precautions, Decreased short-term memory ?  Following Commands: Follows one step commands consistently, Follows one step commands with increased time, Follows multi-step commands  inconsistently ?Safety/Judgement: Decreased awareness of safety, Decreased awareness of deficits ?Awareness: Emergent ?Problem Solving: Slow processing, Decreased initiation, Requires verbal cues, Difficulty sequencing, Requires tactile cues ?General Comments: pt with poor awareness for safety and defcitis, self limiting with poor recall, sequencing and safety awareness ?  ?  ? ?  ?Exercises   ? ?  ?General Comments General comments (skin integrity, edema, etc.): Engaged in PROM and stretch to R UE, reports shoulder pain and noted pt holding R UE in shoulder elevation.  Increased PROM with mobilization but continues to present with generalized weakness. ?  ?  ? ?Pertinent Vitals/Pain Pain Assessment ?Pain Assessment: Faces ?Faces Pain Scale: Hurts a little bit ?Pain Location: R hip ?Pain Descriptors / Indicators: Discomfort, Aching ?Pain Intervention(s): Monitored during session  ? ? ?Home Living   ?  ?  ?  ?  ?  ?  ?  ?  ?  ?   ?  ?Prior Function    ?  ?  ?   ? ?PT Goals (current goals can now be found in the care plan section) Acute Rehab PT Goals ?PT Goal Formulation: With patient ?Time For Goal Achievement: 01/28/22 ?Potential to Achieve Goals: Good ?Progress towards PT goals: Progressing toward goals ? ?  ?Frequency ? ? ? Min 2X/week ? ? ? ?  ?PT Plan Current plan remains appropriate  ? ? ?Co-evaluation PT/OT/SLP Co-Evaluation/Treatment: Yes ?Reason for Co-Treatment: For patient/therapist safety;Necessary to address cognition/behavior during functional activity;To address functional/ADL transfers ?PT goals addressed during session: Mobility/safety with mobility;Balance ?OT goals addressed during session: ADL's and self-care ?  ? ?  ?AM-PAC PT "6 Clicks" Mobility   ?Outcome Measure ? Help needed turning from your back to your side while in a flat bed without using bedrails?: A Little ?Help needed moving from lying on your back to sitting on the side of a flat bed without using bedrails?: A Little ?Help needed  moving to and from a bed to a chair (including a wheelchair)?: A Lot ?Help needed standing up from a chair using your arms (e.g., wheelchair or bedside chair)?: Total ?Help needed to walk in hospital room?: Total ?Help needed climbing 3-5 steps with a railing? : Total ?6 Click Score: 11 ? ?  ?End of Session Equipment Utilized During Treatment: Gait belt ?Activity Tolerance: Patient tolerated treatment well ?Patient left: in chair;with call bell/phone within reach;with chair alarm set ?Nurse Communication: Mobility status ?PT Visit Diagnosis: History of falling (Z91.81);Unsteadiness on feet (R26.81);Other abnormalities of gait and mobility (R26.89);Difficulty in walking, not elsewhere classified (R26.2);Pain;Adult, failure to thrive (R62.7) ?Pain - Right/Left: Right ?Pain - part of body: Hip ?  ? ? ?Time: 5093-2671 ?PT Time Calculation (min) (ACUTE ONLY): 29 min ? ?Charges:  $Gait Training: 8-22 mins          ?          ? ?Jamylah Marinaccio A. Dan Humphreys, PT, DPT ?Acute Rehabilitation Services ?Pager [phone] ?Office [phone] ? ? ? ?Daundre Biel A Quana Chamberlain ?01/16/2022, 12:36 PM ? ?

## 2022-01-16 NOTE — TOC Progression Note (Signed)
Transition of Care (TOC) - Initial/Assessment Note  ? ? ?Patient Details  ?Name: Brandon [name] ?MRN: [id number] ?Date of Birth: 1966-01-14 ? ?Transition of Care (TOC) CM/SW Contact:    ?Brandon [name], LCSWA ?Phone Number: ?01/16/2022, 8:57 AM ? ?Clinical Narrative:                 ?RE: Brandon [name] ?Date of Birth:  01/19/1966 ?Date:  01/16/2022 ? ?Please be advised that the above-named patient will require a short-term nursing home stay - anticipated 30 days or less for rehabilitation and strengthening.  The plan is for return home.  ? ?Expected Discharge Plan: Skilled Nursing Facility ?Barriers to Discharge: Awaiting State Approval Brandon [name]), SNF Pending bed offer ? ? ?Patient Goals and CMS Choice ?  ?CMS Medicare.gov Compare Post Acute Care list provided to:: Patient Represenative (must comment) ?Choice offered to / list presented to : Patient ? ?Expected Discharge Plan and Services ?Expected Discharge Plan: Skilled Nursing Facility ?  ?  ?  ?Living arrangements for the past 2 months: Single Family Home ?                ?  ?  ?  ?  ?  ?  ?  ?  ?  ?  ? ?Prior Living Arrangements/Services ?Living arrangements for the past 2 months: Single Family Home ?  ?Patient language and need for interpreter reviewed:: Yes ?Do you feel safe going back to the place where you live?: Yes      ?Need for Family Participation in Patient Care: No (Comment) ?Care giver support system in place?: Yes (comment) ?  ?Criminal Activity/Legal Involvement Pertinent to Current Situation/Hospitalization: No - Comment as needed ? ?Activities of Daily Living ?Home Assistive Devices/Equipment: Dan Humphreys (specify type) ?ADL Screening (condition at time of admission) ?Patient's cognitive ability adequate to safely complete daily activities?: No ?Is the patient deaf or have difficulty hearing?: No ?Does the patient have difficulty seeing, even when wearing glasses/contacts?: No ?Does the patient have difficulty concentrating, remembering, or making  decisions?: No ?Patient able to express need for assistance with ADLs?: Yes ?Does the patient have difficulty dressing or bathing?: Yes ?Independently performs ADLs?: No ?Communication: Independent ?Dressing (OT): Needs assistance ?Is this a change from baseline?: Pre-admission baseline ?Grooming: Independent ?Feeding: Independent ?Bathing: Needs assistance ?Is this a change from baseline?: Pre-admission baseline ?Toileting: Needs assistance ?Is this a change from baseline?: Pre-admission baseline ?In/Out Bed: Needs assistance ?Is this a change from baseline?: Pre-admission baseline ?Walks in Home: Needs assistance ?Is this a change from baseline?: Pre-admission baseline ?Does the patient have difficulty walking or climbing stairs?: Yes ?Weakness of Legs: Both ?Weakness of Arms/Hands: None ? ?Permission Sought/Granted ?  ?Permission granted to share information with : Yes, Verbal Permission Granted ?   ? Permission granted to share info w AGENCY: SNFs ?   ?   ? ?Emotional Assessment ?Appearance:: Appears older than stated age ?Attitude/Demeanor/Rapport: Engaged ?Affect (typically observed): Adaptable ?Orientation: : Oriented to Self, Oriented to Place, Oriented to  Time, Oriented to Situation ?Alcohol / Substance Use: Not Applicable ?Psych Involvement: No (comment) ? ?Admission diagnosis:  DKA (diabetic ketoacidosis) (HCC) [E11.10] ?Frequent falls [R29.6] ?Traumatic rhabdomyolysis, initial encounter (HCC) [T79.6XXA] ?Diabetic ketoacidosis without coma associated with type 2 diabetes mellitus (HCC) [E11.10] ?Patient Active Problem List  ? Diagnosis Date Noted  ? Pressure injury of skin 01/08/2022  ? Frequent falls 01/08/2022  ? DKA (diabetic ketoacidosis) (HCC) 01/07/2022  ? Rhabdomyolysis 01/07/2022  ? Protein-calorie malnutrition, severe 12/23/2020  ?  Hyperosmolar hyperglycemic state (HHS) (HCC) 12/22/2020  ? Hyperglycemia due to type 2 diabetes mellitus (HCC) 12/21/2020  ? Hyperglycemia due to diabetes mellitus  (HCC) 12/21/2020  ? Empyema lung (HCC)   ? Epidural abscess   ? Diskitis 02/06/2020  ? Alcohol withdrawal delirium (HCC) 02/06/2020  ? Epilepsia (HCC) 02/06/2020  ? Pleural effusion on right 02/06/2020  ? History of stroke 01/23/2017  ? Dry eyes   ? Sleep disturbance   ? Essential hypertension, benign   ? Upper GI bleed   ? 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  ? Fatty liver   ? Tobacco abuse   ? Diabetes mellitus type 2 in nonobese [facility])   ? History of CVA with residual deficit   ? Acute lower UTI   ? Tachycardia   ? Chronic alcoholic pancreatitis (HCC)   ? Hyponatremia 03/09/2016  ? 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  ? Acute blood loss anemia 03/09/2016  ? Gastric varices   ? Alcohol abuse   ? Left-sided neglect   ? Severe anemia   ? UGIB (upper gastrointestinal bleed)   ? Pressure ulcer 03/06/2016  ? Acute encephalopathy   ? CVA (cerebral infarction) 03/05/2016  ? Carotid stenosis 04/06/2015  ? CAD in native artery 03/16/2015  ? Unstable angina pectoris (HCC) 03/15/2015  ? Neck pain 10/20/2013  ? Insomnia 12/29/2012  ? 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 Occlusion and stenosis of carotid artery with cerebral infarction 12/11/2012  ? Unspecified cerebral artery occlusion with cerebral infarction 12/11/2012  ? Fatigue 11/26/2012  ? Hallux valgus 06/25/2012  ? Preventative health care 06/25/2012  ? Alcohol Dependence 06/18/2012  ? Smoking 06/16/2012  ? Bilateral extracranial carotid artery stenosis   ? Coronary Artery Disease 06/13/2012  ? Ischemic Stroke 06/13/2012  ? Hyperlipidemia   ? Hypertension 02/25/2011  ? GERD (gastroesophageal reflux disease) 02/25/2011  ? Chronic Pancreatitis. 02/25/2011  ? Hepatic steatosis 02/25/2011  ? ?PCP:  Armando Gang, FNP ?Pharmacy:   ?[id number] - [place], [place] - [address] ?[address] ?[place] [address] ?Phone:  [phone] Fax: [phone] ? ? ? ? ?Social Determinants of Health (SDOH) Interventions ?  ? ?Readmission Risk Interventions ?   ? View : No data to display.  ?  ?  ?  ? ? ? ?

## 2022-01-16 NOTE — TOC Progression Note (Addendum)
Transition of Care (TOC) - Initial/Assessment Note  ? ? ?Patient Details  ?Name: Brandon [name] ?MRN: [id number] ?Date of Birth: 13-Jul-1966 ? ?Transition of Care (TOC) CM/SW Contact:    ?Catalina Pizza [place] Silveria, LCSWA ?Phone Number: ?01/16/2022, 12:34 PM ? ?Clinical Narrative:                 ?CSW contacted Delorise Shiner with [facility].  The facility is able to accept the patient and will begin insurance auth today.   ? ?CSW faxed additional information to [employer]. ? ?Pending:  insurance auth and [place] approval. ? ?15:03-  [employer] received-  [id number] ? ?Pending: insurance auth. ? ?17:10- CSW notified that insurance Berkley Harvey has been received.  Patient can d/c tomorrow.   ? ?Expected Discharge Plan: Skilled Nursing Facility ?Barriers to Discharge: Awaiting State Approval Cherlyn Roberts), SNF Pending bed offer ? ? ?Patient Goals and CMS Choice ?  ?CMS Medicare.gov Compare Post Acute Care list provided to:: Patient Represenative (must comment) ?Choice offered to / list presented to : Patient ? ?Expected Discharge Plan and Services ?Expected Discharge Plan: Skilled Nursing Facility ?  ?  ?  ?Living arrangements for the past 2 months: Single Family Home ?                ?  ?  ?  ?  ?  ?  ?  ?  ?  ?  ? ?Prior Living Arrangements/Services ?Living arrangements for the past 2 months: Single Family Home ?  ?Patient language and need for interpreter reviewed:: Yes ?Do you feel safe going back to the place where you live?: Yes      ?Need for Family Participation in Patient Care: No (Comment) ?Care giver support system in place?: Yes (comment) ?  ?Criminal Activity/Legal Involvement Pertinent to Current Situation/Hospitalization: No - Comment as needed ? ?Activities of Daily Living ?Home Assistive Devices/Equipment: Dan Humphreys (specify type) ?ADL Screening (condition at time of admission) ?Patient's cognitive ability adequate to safely complete daily activities?: No ?Is the patient deaf or have difficulty hearing?: No ?Does the patient have difficulty  seeing, even when wearing glasses/contacts?: No ?Does the patient have difficulty concentrating, remembering, or making decisions?: No ?Patient able to express need for assistance with ADLs?: Yes ?Does the patient have difficulty dressing or bathing?: Yes ?Independently performs ADLs?: No ?Communication: Independent ?Dressing (OT): Needs assistance ?Is this a change from baseline?: Pre-admission baseline ?Grooming: Independent ?Feeding: Independent ?Bathing: Needs assistance ?Is this a change from baseline?: Pre-admission baseline ?Toileting: Needs assistance ?Is this a change from baseline?: Pre-admission baseline ?In/Out Bed: Needs assistance ?Is this a change from baseline?: Pre-admission baseline ?Walks in Home: Needs assistance ?Is this a change from baseline?: Pre-admission baseline ?Does the patient have difficulty walking or climbing stairs?: Yes ?Weakness of Legs: Both ?Weakness of Arms/Hands: None ? ?Permission Sought/Granted ?  ?Permission granted to share information with : Yes, Verbal Permission Granted ?   ? Permission granted to share info w AGENCY: SNFs ?   ?   ? ?Emotional Assessment ?Appearance:: Appears older than stated age ?Attitude/Demeanor/Rapport: Engaged ?Affect (typically observed): Adaptable ?Orientation: : Oriented to Self, Oriented to Place, Oriented to  Time, Oriented to Situation ?Alcohol / Substance Use: Not Applicable ?Psych Involvement: No (comment) ? ?Admission diagnosis:  DKA (diabetic ketoacidosis) (HCC) [E11.10] ?Frequent falls [R29.6] ?Traumatic rhabdomyolysis, initial encounter (HCC) [T79.6XXA] ?Diabetic ketoacidosis without coma associated with type 2 diabetes mellitus (HCC) [E11.10] ?Patient Active Problem List  ? Diagnosis Date Noted  ? Pressure injury of skin 01/08/2022  ?  Frequent falls 01/08/2022  ? DKA (diabetic ketoacidosis) (HCC) 01/07/2022  ? Rhabdomyolysis 01/07/2022  ? Protein-calorie malnutrition, severe 12/23/2020  ? Hyperosmolar hyperglycemic state (HHS) (HCC)  12/22/2020  ? Hyperglycemia due to type 2 diabetes mellitus (HCC) 12/21/2020  ? Hyperglycemia due to diabetes mellitus (HCC) 12/21/2020  ? Empyema lung (HCC)   ? Epidural abscess   ? Diskitis 02/06/2020  ? Alcohol withdrawal delirium (HCC) 02/06/2020  ? Epilepsia (HCC) 02/06/2020  ? Pleural effusion on right 02/06/2020  ? History of stroke 01/23/2017  ? Dry eyes   ? Sleep disturbance   ? Essential hypertension, benign   ? Upper GI bleed   ? 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  ? Fatty liver   ? Tobacco abuse   ? Diabetes mellitus type 2 in nonobese [facility])   ? History of CVA with residual deficit   ? Acute lower UTI   ? Tachycardia   ? Chronic alcoholic pancreatitis (HCC)   ? Hyponatremia 03/09/2016  ? 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  ? Acute blood loss anemia 03/09/2016  ? Gastric varices   ? Alcohol abuse   ? Left-sided neglect   ? Severe anemia   ? UGIB (upper gastrointestinal bleed)   ? Pressure ulcer 03/06/2016  ? Acute encephalopathy   ? CVA (cerebral infarction) 03/05/2016  ? Carotid stenosis 04/06/2015  ? CAD in native artery 03/16/2015  ? Unstable angina pectoris (HCC) 03/15/2015  ? Neck pain 10/20/2013  ? Insomnia 12/29/2012  ? 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 Occlusion and stenosis of carotid artery with cerebral infarction 12/11/2012  ? Unspecified cerebral artery occlusion with cerebral infarction 12/11/2012  ? Fatigue 11/26/2012  ? Hallux valgus 06/25/2012  ? Preventative health care 06/25/2012  ? Alcohol Dependence 06/18/2012  ? Smoking 06/16/2012  ? Bilateral extracranial carotid artery stenosis   ? Coronary Artery Disease 06/13/2012  ? Ischemic Stroke 06/13/2012  ? Hyperlipidemia   ? Hypertension 02/25/2011  ? GERD (gastroesophageal reflux disease) 02/25/2011  ? Chronic Pancreatitis. 02/25/2011  ? Hepatic steatosis 02/25/2011  ? ?PCP:  Armando Gang, FNP ?Pharmacy:   ?[id number] - [place], [place] - [address] ?[address] ?[place] [address] ?Phone: [phone] Fax: [phone] ? ? ? ? ?Social Determinants of Health (SDOH) Interventions ?  ? ?Readmission Risk Interventions ?   ? View : No data to display.  ?  ?  ?  ? ? ? ?

## 2022-01-16 NOTE — [place] FL2 (Signed)
? MEDICAID FL2 LEVEL OF CARE SCREENING TOOL  ?  ? ?IDENTIFICATION  ?Patient Name: ?Brandon [name] Birthdate: 1966-06-03 Sex: male Admission Date (Current Location): ?01/13/2022  ?[place] and [place] Number: ? [place] ?[id number] P Facility and Address:  ?[facility] [place] [place] [address], [place], [place] [address] ?     Provider Number: ?[id number]  ?Attending Physician Name and Address:  ?Barb Merino, MD ? Relative Name and Phone Number:  ?Justices,[name] (Sister)   [phone] ?   ?Current Level of Care: ?Hospital Recommended Level of Care: ?[place] Prior Approval Number: ?  ? ?Date Approved/Denied: ?  PASRR Number: ?pending ? ?Discharge Plan: ?  ?  ? ?Current Diagnoses: ?Patient Active Problem List  ? Diagnosis Date Noted  ? Pressure injury of skin 01/08/2022  ? Frequent falls 01/08/2022  ? DKA (diabetic ketoacidosis) ([place]) 01/07/2022  ? Rhabdomyolysis 01/07/2022  ? Protein-calorie malnutrition, severe 12/23/2020  ? Hyperosmolar hyperglycemic state (HHS) ([place]) 12/22/2020  ? Hyperglycemia due to type 2 diabetes mellitus ([place]) 12/21/2020  ? Hyperglycemia due to diabetes mellitus ([place]) 12/21/2020  ? Empyema lung ([place])   ? Epidural abscess   ? Diskitis 02/06/2020  ? Alcohol withdrawal delirium ([place]) 02/06/2020  ? Epilepsia ([place]) 02/06/2020  ? Pleural effusion on right 02/06/2020  ? History of stroke 01/23/2017  ? Dry eyes   ? Sleep disturbance   ? Essential hypertension, benign   ? Upper GI bleed   ? Right middle cerebral artery stroke ([place]) 03/12/2016  ? Fatty liver   ? Tobacco abuse   ? Diabetes mellitus type 2 in nonobese [facility])   ? History of CVA with residual deficit   ? Acute lower UTI   ? Tachycardia   ? Chronic alcoholic pancreatitis ([place])   ? Hyponatremia 03/09/2016  ? 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  ? Acute blood loss anemia 03/09/2016  ? Gastric varices   ? Alcohol abuse   ? Left-sided neglect   ? Severe anemia   ? UGIB  (upper gastrointestinal bleed)   ? Pressure ulcer 03/06/2016  ? Acute encephalopathy   ? CVA (cerebral infarction) 03/05/2016  ? Carotid stenosis 04/06/2015  ? CAD in native artery 03/16/2015  ? Unstable angina pectoris ([place]) 03/15/2015  ? Neck pain 10/20/2013  ? Insomnia 12/29/2012  ? 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 Occlusion and stenosis of carotid artery with cerebral infarction 12/11/2012  ? Unspecified cerebral artery occlusion with cerebral infarction 12/11/2012  ? Fatigue 11/26/2012  ? Hallux valgus 06/25/2012  ? Preventative health care 06/25/2012  ? Alcohol Dependence 06/18/2012  ? Smoking 06/16/2012  ? Bilateral extracranial carotid artery stenosis   ? Coronary Artery Disease 06/13/2012  ? Ischemic Stroke 06/13/2012  ? Hyperlipidemia   ? Hypertension 02/25/2011  ? GERD (gastroesophageal reflux disease) 02/25/2011  ? Chronic Pancreatitis. 02/25/2011  ? Hepatic steatosis 02/25/2011  ? ? ?Orientation RESPIRATION BLADDER Height & Weight   ?  ?Self, Time, Situation, Place ? Normal Incontinent Weight: 130 lb 8.2 oz (59.2 kg) ?Height:  6\' 1"  (185.4 cm)  ?BEHAVIORAL SYMPTOMS/MOOD NEUROLOGICAL BOWEL NUTRITION STATUS  ?    Continent Diet (see discharge summary)  ?AMBULATORY STATUS COMMUNICATION OF NEEDS Skin   ?Extensive Assist Verbally PU Stage and Appropriate Care (1.  Left shoulder- stage 2;  2. Hip Lateral- stage;  3.  Right knee- unstageable; 4. coccyx- stage 2) ?  ?  ?  ?    ?     ?     ? ? ?  Personal Care Assistance Level of Assistance  ?Bathing, Feeding, Dressing Bathing Assistance: Maximum assistance ?Feeding assistance: Independent ?Dressing Assistance: Maximum assistance ?   ? ?Functional Limitations Info  ?Sight, Hearing, Speech Sight Info: Adequate ?Hearing Info: Adequate ?Speech Info: Adequate  ? ? ?SPECIAL CARE FACTORS FREQUENCY  ?PT (By licensed PT), OT (By licensed OT)   ?  ?PT Frequency: 5x/ week ?OT Frequency: 5x/ week ?  ?  ?  ?   ? ? ?Contractures    ? ? ?Additional Factors Info   ?Code Status, Allergies, Insulin Sliding Scale, Psychotropic Code Status Info: Full ?Allergies Info: NKA ?Psychotropic Info: Buspar, trazadone ?Insulin Sliding Scale Info: see d/c med list ?  ?   ? ?Current Medications (01/16/2022):  This is the current hospital active medication list ?Current Facility-Administered Medications  ?Medication Dose Route Frequency Provider Last Rate Last Admin  ? acetaminophen (TYLENOL) tablet 650 mg  650 mg Oral Q6H PRN Howerter, Justin B, DO   650 mg at 01/14/22 2232  ? aspirin EC tablet 81 mg  81 mg Oral Daily Opyd, Ilene Qua, MD   81 mg at 01/15/22 1032  ? busPIRone (BUSPAR) tablet 10 mg  10 mg Oral BID Vianne Bulls, MD   10 mg at 01/15/22 2121  ? clopidogrel (PLAVIX) tablet 75 mg  75 mg Oral Daily Opyd, Ilene Qua, MD   75 mg at 01/15/22 1032  ? dextrose 50 % solution 0-50 mL  0-50 mL Intravenous PRN Opyd, Ilene Qua, MD      ? enoxaparin (LOVENOX) injection 40 mg  40 mg Subcutaneous Q24H Opyd, Ilene Qua, MD   40 mg at 01/15/22 1031  ? famotidine (PEPCID) tablet 10 mg  10 mg Oral BID Vianne Bulls, MD   10 mg at 01/15/22 2123  ? insulin aspart (novoLOG) injection 0-5 Units  0-5 Units Subcutaneous QHS Barb Merino, MD      ? insulin aspart (novoLOG) injection 0-9 Units  0-9 Units Subcutaneous TID WC Barb Merino, MD   9 Units at 01/15/22 1144  ? insulin aspart (novoLOG) injection 3 Units  3 Units Subcutaneous TID WC Barb Merino, MD   3 Units at 01/16/22 0818  ? insulin detemir (LEVEMIR) injection 18 Units  18 Units Subcutaneous Daily Barb Merino, MD   18 Units at 01/15/22 1032  ? leptospermum manuka honey (MEDIHONEY) paste 1 application.  1 application. Topical Daily Barb Merino, MD   1 application. at 01/15/22 1034  ? simvastatin (ZOCOR) tablet 20 mg  20 mg Oral q1800 Opyd, Ilene Qua, MD   20 mg at 01/15/22 1703  ? traMADol (ULTRAM) tablet 50 mg  50 mg Oral Q6H PRN Howerter, Justin B, DO   50 mg at 01/15/22 2121  ? traZODone (DESYREL) tablet 150 mg  150 mg Oral QHS  Opyd, Ilene Qua, MD   150 mg at 01/15/22 2121  ? ? ? ?Discharge Medications: ?Please see discharge summary for a list of discharge medications. ? ?Relevant Imaging Results: ? ?Relevant Lab Results: ? ? ?Additional Information ?SSN [id number] ? ?Paulene Floor Amala Petion, LCSWA ? ? ? ? ?

## 2022-01-17 LAB — GLUCOSE, CAPILLARY
Glucose-Capillary: 238 mg/dL — ABNORMAL HIGH (ref 70–99)
Glucose-Capillary: 207 mg/dL — ABNORMAL HIGH (ref 70–99)
Glucose-Capillary: 210 mg/dL — ABNORMAL HIGH (ref 70–99)
Glucose-Capillary: 275 mg/dL — ABNORMAL HIGH (ref 70–99)

## 2022-01-17 LAB — METHYLMALONIC ACID, SERUM: Methylmalonic Acid, Quantitative: 106 nmol/L (ref 0–378)

## 2022-01-17 LAB — PHOSPHORUS: Phosphorus: 2.9 mg/dL (ref 2.5–4.6)

## 2022-01-17 MED ORDER — INSULIN ASPART 100 UNIT/ML IJ SOLN: 4.0000 [IU] | Freq: Three times a day (TID) | INTRAMUSCULAR | 11 refills | Status: DC

## 2022-01-17 MED ORDER — INSULIN DETEMIR 100 UNIT/ML FLEXPEN: 18.0000 [IU] | PEN_INJECTOR | Freq: Every day | SUBCUTANEOUS | 11 refills | Status: DC

## 2022-01-17 NOTE — TOC Transition Note (Signed)
Transition of Care (TOC) - CM/SW Discharge Note ? ? ?Patient Details  ?Name: Brandon [name] ?MRN: [id number] ?Date of Birth: 05/31/1966 ? ?Transition of Care (TOC) CM/SW Contact:  ?Catalina Pizza Jaydon Avina, LCSWA ?Phone Number: ?01/17/2022, 12:47 PM ? ? ?Clinical Narrative:    ?Patient will DC to:  [facility] ?Anticipated DC date:  01/17/2022 ?Transport by: Sharin Mons ? ? ?Per MD patient ready for DC to [facility]. RN to call report prior to discharge [phone] room 122. RN, patient, and facility notified of DC. Discharge Summary and FL2 sent to facility. DC packet on chart. Ambulance transport will be requested for patient.  ? ?CSW will sign off for now as social work intervention is no longer needed. Please consult [place] again if new needs arise. ?  ? ? ?Final next level of care: Skilled Nursing Facility ?Barriers to Discharge: Barriers Resolved ? ? ?Patient Goals and CMS Choice ?  ?CMS Medicare.gov Compare Post Acute Care list provided to:: Patient Represenative (must comment) ?Choice offered to / list presented to : Patient ? ?Discharge Placement ?  ?           ?Patient chooses bed at:  [facility]) ?Patient to be transferred to facility by: [facility] ?Name of family member notified: patient alert ?Patient and family notified of of transfer: 01/17/22 ? ?Discharge Plan and Services ?  ?  ?           ?  ?  ?  ?  ?  ?  ?  ?  ?  ?  ? ?Social Determinants of Health (SDOH) Interventions ?  ? ? ?Readmission Risk Interventions ?   ? View : No data to display.  ?  ?  ?  ? ? ? ? ? ?

## 2022-01-17 NOTE — Discharge Summary (Signed)
Physician Discharge Summary  ?Brandon [name] ZOX:[id number] DOB: 1966-04-30 DOA: 01/13/2022 ? ?PCP: Armando Gang, FNP ? ?Admit date: 01/13/2022 ?Discharge date: 01/17/2022 ? ?Admitted From: Home ?Disposition: Skilled nursing facility ? ?Recommendations for Outpatient Follow-up:  ?Follow up with PCP in 1-2 weeks ?Please obtain BMP/CBC in one week ? ? ?Home Health: N/A ?Equipment/Devices: N/A ? ?Discharge Condition: Stable ?CODE STATUS: Full code ?Diet recommendation: Low-salt and low-carb diet, consistent oral intake. ? ?Discharge summary: ?56 year old chronically sick gentleman with history of stroke and right hemiparesis, insulin-dependent type 2 diabetes, hypertension and malnutrition presented with recurrent fall, unable to get off the floor and left shoulder pain.  Uses four-wheel walker at home, has chronic numbness and tingling of the feet.  Recently admitted to [facility] hospital with hyperglycemia, treated with insulin and discharged home on increased dose of insulin.  In the emergency room afebrile, tachycardic, systolic blood pressure 98.  Blood glucose 531, anion gap 26.  Leukocytosis 18,000.  Skeletal survey was negative.  Patient was given IV fluid boluses and started on insulin infusion and admitted ?  ?  ?Assessment & Plan: ?  ?Diabetic ketoacidosis in a patient with type 2 diabetes, uncontrolled with hyperglycemia: ?Treated with aggressive IV fluid and IV insulin and ultimately converted to subcu insulin. ?Home regimen Levemir 25 units, NovoLog 6 units with meals.  1 hypoglycemic episode in the hospital. ?Change Levemir to 18 units once a day, continue prandial insulin 4 units with meals.  Recent known A1c of 13.  Brittle diabetes, definitely some evidence of noncompliance. ?Inconsistent dietary intake.  Stable today. ?  ?Electrolytes, hypomagnesemia, hypophosphatemia, hypokalemia: Replaced and adequate.   ?  ?Deconditioning, frailty and debility: History of stroke, no new neurological  deficits.  Currently on aspirin Plavix and statin that is continued. ?Significantly debilitated.  Will benefit with PT OT and inpatient therapies before going home. ? ?Traumatic rhabdomyolysis: With frequent fall.  Treated with IV fluids.  Improved.  Encourage oral intake. ? ?Severe protein calorie malnutrition: Due to chronic medical issues.  Augment nutrition. ? ?Pressure Injury 01/07/22 Coccyx Medial Stage 2 -  Partial thickness loss of dermis presenting as a shallow open injury with a red, pink wound bed without slough. discolored (Active)  ?01/07/22 2045  ?Location: Coccyx  ?Location Orientation: Medial  ?Staging: Stage 2 -  Partial thickness loss of dermis presenting as a shallow open injury with a red, pink wound bed without slough.  ?Wound Description (Comments): discolored  ?Present on Admission: Yes  ?   ?Pressure Injury 01/14/22 Shoulder Left;Posterior Stage 2 -  Partial thickness loss of dermis presenting as a shallow open injury with a red, pink wound bed without slough. (Active)  ?01/14/22 1800  ?Location: Shoulder  ?Location Orientation: Left;Posterior  ?Staging: Stage 2 -  Partial thickness loss of dermis presenting as a shallow open injury with a red, pink wound bed without slough.  ?Wound Description (Comments):   ?Present on Admission: Yes  ?   ?Pressure Injury 01/14/22 Hip Lateral;Right Stage 2 -  Partial thickness loss of dermis presenting as a shallow open injury with a red, pink wound bed without slough. (Active)  ?01/14/22 1800  ?Location: Hip  ?Location Orientation: Lateral;Right  ?Staging: Stage 2 -  Partial thickness loss of dermis presenting as a shallow open injury with a red, pink wound bed without slough.  ?Wound Description (Comments):   ?Present on Admission: Yes  ?   ?Pressure Injury 01/14/22 Knee Lateral;Right Unstageable - Full thickness tissue loss in which  the base of the injury is covered by slough (yellow, tan, gray, green or brown) and/or eschar (tan, brown or black) in the  wound bed. (Active)  ?01/14/22 1800  ?Location: Knee  ?Location Orientation: Lateral;Right  ?Staging: Unstageable - Full thickness tissue loss in which the base of the injury is covered by slough (yellow, tan, gray, green or brown) and/or eschar (tan, brown or black) in the wound bed.  ?Wound Description (Comments):   ?Present on Admission: Yes  ? ?Wound care instructions attached. ? ? ?Discharge Diagnoses:  ?Principal Problem: ?  DKA (diabetic ketoacidosis) (HCC) ?Active Problems: ?  CAD in native artery ?  History of CVA with residual deficit ?  Protein-calorie malnutrition, severe ?  Rhabdomyolysis ?  Frequent falls ? ? ? ?Discharge Instructions ? ?Discharge Instructions   ? ? Diet - low sodium heart healthy   Complete by: As directed ?  ? Diet Carb Modified   Complete by: As directed ?  ? Discharge wound care:   Complete by: As directed ?  ? Wound care  Every shift      ?Comments: Cleanse buttocks with soap and water.  Apply Barrier cream to buttocks each shift and PRN soilage.  ?Apply Medihoney to wounds on Right hip, right arm and right knee.  Cover with foam dressing to pad and protect. Peel back foam and reapply daily and change foam every three days and PRN soilage.  ? Increase activity slowly   Complete by: As directed ?  ? ?  ? ?Allergies as of 01/17/2022   ? ?   Reactions  ? No Known Allergies   ? ?  ? ?  ?Medication List  ?  ? ?STOP taking these medications   ? ?lisinopril 10 MG tablet ?Commonly known as: ZESTRIL ?  ? ?  ? ?TAKE these medications   ? ?ascorbic acid 500 MG tablet ?Commonly known as: VITAMIN C ?Take 500 mg by mouth daily. ?  ?Aspirin Low Dose 81 MG EC tablet ?Generic drug: aspirin ?Take 1 tablet (81 mg total) by mouth daily. ?  ?busPIRone 10 MG tablet ?Commonly known as: BUSPAR ?Take 1 tablet (10 mg total) by mouth 2 (two) times daily. ?  ?clopidogrel 75 MG tablet ?Commonly known as: PLAVIX ?Take 1 tablet (75 mg total) by mouth daily. ?  ?famotidine 20 MG tablet ?Commonly known as:  PEPCID ?Take 1 tablet (20 mg total) by mouth 2 (two) times daily. ?  ?feeding supplement (GLUCERNA SHAKE) Liqd ?Take 237 mLs by mouth 2 (two) times daily between meals. ?What changed: Another medication with the same name was removed. Continue taking this medication, and follow the directions you see here. ?  ?FeroSul 325 (65 FE) MG tablet ?Generic drug: ferrous sulfate ?Take 1 tablet (325 mg total) by mouth daily. ?  ?folic acid 1 MG tablet ?Commonly known as: FOLVITE ?Take 1 tablet (1 mg total) by mouth daily. ?  ?insulin aspart 100 UNIT/ML injection ?Commonly known as: novoLOG ?Inject 4 Units into the skin 3 (three) times daily before meals. Sliding scale ?What changed: how much to take ?  ?insulin detemir 100 UNIT/ML FlexPen ?Commonly known as: LEVEMIR ?Inject 18 Units into the skin at bedtime. ?What changed: how much to take ?  ?lipase/protease/amylase 85462 UNITS Cpep capsule ?Commonly known as: CREON ?Take 72,000 Units by mouth daily. ?  ?metFORMIN 1000 MG tablet ?Commonly known as: GLUCOPHAGE ?Take 1 tablet (1,000 mg total) by mouth 2 (two) times daily with a meal. ?  ?  metoprolol tartrate 100 MG tablet ?Commonly known as: LOPRESSOR ?Take 100 mg by mouth daily. ?  ?multivitamin with minerals Tabs tablet ?Take 1 tablet by mouth daily. ?  ?nicotine 14 mg/24hr patch ?Commonly known as: NICODERM CQ - dosed in mg/24 hours ?Place 1 patch (14 mg total) onto the skin daily. ?What changed: additional instructions ?  ?Pen Needles 31G X 5 MM Misc ?1 each by Does not apply route daily. ?  ?simvastatin 20 MG tablet ?Commonly known as: ZOCOR ?Take 1 tablet (20 mg total) by mouth daily at 6 PM. ?  ?traZODone 150 MG tablet ?Commonly known as: DESYREL ?Take 1 tablet (150 mg total) by mouth at bedtime. ?What changed: how much to take ?  ? ?  ? ?  ?  ? ? ?  ?Discharge Care Instructions  ?(From admission, onward)  ?  ? ? ?  ? ?  Start     Ordered  ? 01/17/22 0000  Discharge wound care:       ?Comments: Wound care  Every shift       ?Comments: Cleanse buttocks with soap and water.  Apply Barrier cream to buttocks each shift and PRN soilage.  ?Apply Medihoney to wounds on Right hip, right arm and right knee.  Cover with foam dressing to pa

## 2022-01-17 NOTE — Progress Notes (Signed)
Report called to [facility] nurse Liborio Nixon ?

## 2022-01-17 NOTE — Progress Notes (Signed)
DISCHARGE NOTE SNF [name] to be discharged Skilled nursing facility [facility] per MD order. Patient verbalized understanding. ? ?Skin clean, dry and intact without evidence of skin break down, no evidence of skin tears noted. IV catheter discontinued intact. Site without signs and symptoms of complications. Dressing and pressure applied. Pt denies pain at the site currently. No complaints noted. ? ?Patient free of lines, drains, and wounds.  ? ?Discharge packet assembled. An After Visit Summary (AVS) was printed and given to the EMS personnel. Patient escorted via stretcher and discharged to [facility] via ambulance. Report called to accepting facility by datyshift RN Candice; all questions and concerns addressed.  ? ?Milus Banister, RN ?

## 2022-01-18 ENCOUNTER — Telehealth: Payer: Self-pay

## 2022-01-18 NOTE — Telephone Encounter (Signed)
Transition Care Management Unsuccessful Follow-up Telephone Call ? ?Date of discharge and from where:  01/17/2022 from [place] ? ?Attempts:  1st Attempt ? ?Reason for unsuccessful TCM follow-up call:  Unable to leave message ? ? ? ?

## 2022-01-21 DIAGNOSIS — I10 Essential (primary) hypertension: Secondary | ICD-10-CM | POA: Diagnosis not present

## 2022-01-22 NOTE — Telephone Encounter (Signed)
Transition Care Management Unsuccessful Follow-up Telephone Call ? ?Date of discharge and from where:  01/17/2022 from [facility] ? ?Attempts:  2nd Attempt ? ?Reason for unsuccessful TCM follow-up call:  Unable to leave message ? ? ? ?

## 2022-01-23 DIAGNOSIS — E43 Unspecified severe protein-calorie malnutrition: Secondary | ICD-10-CM | POA: Diagnosis not present

## 2022-01-23 DIAGNOSIS — K219 Gastro-esophageal reflux disease without esophagitis: Secondary | ICD-10-CM | POA: Diagnosis not present

## 2022-01-23 DIAGNOSIS — F329 Major depressive disorder, single episode, unspecified: Secondary | ICD-10-CM | POA: Diagnosis not present

## 2022-01-23 DIAGNOSIS — E119 Type 2 diabetes mellitus without complications: Secondary | ICD-10-CM | POA: Diagnosis not present

## 2022-01-23 DIAGNOSIS — L89890 Pressure ulcer of other site, unstageable: Secondary | ICD-10-CM | POA: Diagnosis not present

## 2022-01-23 DIAGNOSIS — G4700 Insomnia, unspecified: Secondary | ICD-10-CM | POA: Diagnosis not present

## 2022-01-23 DIAGNOSIS — R5381 Other malaise: Secondary | ICD-10-CM | POA: Diagnosis not present

## 2022-01-23 DIAGNOSIS — I639 Cerebral infarction, unspecified: Secondary | ICD-10-CM | POA: Diagnosis not present

## 2022-01-23 DIAGNOSIS — G8191 Hemiplegia, unspecified affecting right dominant side: Secondary | ICD-10-CM | POA: Diagnosis not present

## 2022-01-23 DIAGNOSIS — E1110 Type 2 diabetes mellitus with ketoacidosis without coma: Secondary | ICD-10-CM | POA: Diagnosis not present

## 2022-01-23 DIAGNOSIS — D649 Anemia, unspecified: Secondary | ICD-10-CM | POA: Diagnosis not present

## 2022-01-23 DIAGNOSIS — E785 Hyperlipidemia, unspecified: Secondary | ICD-10-CM | POA: Diagnosis not present

## 2022-01-23 DIAGNOSIS — M6281 Muscle weakness (generalized): Secondary | ICD-10-CM | POA: Diagnosis not present

## 2022-01-23 NOTE — Telephone Encounter (Signed)
Transition Care Management Unsuccessful Follow-up Telephone Call ? ?Date of discharge and from where:  01/17/2022 from [facility] ? ?Attempts:  3rd Attempt ? ?Reason for unsuccessful TCM follow-up call:  Unable to reach patient ? ? ? ?

## 2022-01-24 DIAGNOSIS — E43 Unspecified severe protein-calorie malnutrition: Secondary | ICD-10-CM | POA: Diagnosis not present

## 2022-01-24 DIAGNOSIS — E119 Type 2 diabetes mellitus without complications: Secondary | ICD-10-CM | POA: Diagnosis not present

## 2022-01-24 DIAGNOSIS — E785 Hyperlipidemia, unspecified: Secondary | ICD-10-CM | POA: Diagnosis not present

## 2022-01-24 DIAGNOSIS — G4700 Insomnia, unspecified: Secondary | ICD-10-CM | POA: Diagnosis not present

## 2022-01-24 DIAGNOSIS — E1165 Type 2 diabetes mellitus with hyperglycemia: Secondary | ICD-10-CM | POA: Diagnosis not present

## 2022-01-24 DIAGNOSIS — G8191 Hemiplegia, unspecified affecting right dominant side: Secondary | ICD-10-CM | POA: Diagnosis not present

## 2022-01-24 DIAGNOSIS — I639 Cerebral infarction, unspecified: Secondary | ICD-10-CM | POA: Diagnosis not present

## 2022-01-24 DIAGNOSIS — K219 Gastro-esophageal reflux disease without esophagitis: Secondary | ICD-10-CM | POA: Diagnosis not present

## 2022-01-24 DIAGNOSIS — D649 Anemia, unspecified: Secondary | ICD-10-CM | POA: Diagnosis not present

## 2022-01-30 DIAGNOSIS — E43 Unspecified severe protein-calorie malnutrition: Secondary | ICD-10-CM | POA: Diagnosis not present

## 2022-01-30 DIAGNOSIS — I10 Essential (primary) hypertension: Secondary | ICD-10-CM | POA: Diagnosis not present

## 2022-01-30 DIAGNOSIS — K219 Gastro-esophageal reflux disease without esophagitis: Secondary | ICD-10-CM | POA: Diagnosis not present

## 2022-01-30 DIAGNOSIS — E785 Hyperlipidemia, unspecified: Secondary | ICD-10-CM | POA: Diagnosis not present

## 2022-01-30 DIAGNOSIS — L89890 Pressure ulcer of other site, unstageable: Secondary | ICD-10-CM | POA: Diagnosis not present

## 2022-01-30 DIAGNOSIS — E119 Type 2 diabetes mellitus without complications: Secondary | ICD-10-CM | POA: Diagnosis not present

## 2022-01-30 DIAGNOSIS — G4700 Insomnia, unspecified: Secondary | ICD-10-CM | POA: Diagnosis not present

## 2022-01-30 DIAGNOSIS — E1110 Type 2 diabetes mellitus with ketoacidosis without coma: Secondary | ICD-10-CM | POA: Diagnosis not present

## 2022-01-30 DIAGNOSIS — E1165 Type 2 diabetes mellitus with hyperglycemia: Secondary | ICD-10-CM | POA: Diagnosis not present

## 2022-01-30 DIAGNOSIS — M6281 Muscle weakness (generalized): Secondary | ICD-10-CM | POA: Diagnosis not present

## 2022-02-04 ENCOUNTER — Other Ambulatory Visit: Payer: Self-pay | Admitting: Obstetrics and Gynecology

## 2022-02-04 NOTE — Patient Outreach (Signed)
Care Coordination ? ?02/04/2022 ? [name] ?1966/07/22 ?[id number] ? ?RNCM called patient at both numbers and unable to reach.  Called patient's sister, Ms. Stevan Born,  and she stated patient was still at [facility] for rehabilitation.  RNCM rescheduled appointment. ? ?Kathi Der RN, BSN ?[employer]  [facility] ?Care Management Coordinator - Managed Medicaid High Risk ?[phone] ?  ? ? ?

## 2022-02-21 DIAGNOSIS — E1110 Type 2 diabetes mellitus with ketoacidosis without coma: Secondary | ICD-10-CM | POA: Diagnosis not present

## 2022-02-21 DIAGNOSIS — G4700 Insomnia, unspecified: Secondary | ICD-10-CM | POA: Diagnosis not present

## 2022-02-21 DIAGNOSIS — I10 Essential (primary) hypertension: Secondary | ICD-10-CM | POA: Diagnosis not present

## 2022-02-21 DIAGNOSIS — I639 Cerebral infarction, unspecified: Secondary | ICD-10-CM | POA: Diagnosis not present

## 2022-02-21 DIAGNOSIS — E785 Hyperlipidemia, unspecified: Secondary | ICD-10-CM | POA: Diagnosis not present

## 2022-02-21 DIAGNOSIS — F329 Major depressive disorder, single episode, unspecified: Secondary | ICD-10-CM | POA: Diagnosis not present

## 2022-02-21 DIAGNOSIS — K219 Gastro-esophageal reflux disease without esophagitis: Secondary | ICD-10-CM | POA: Diagnosis not present

## 2022-02-21 DIAGNOSIS — E119 Type 2 diabetes mellitus without complications: Secondary | ICD-10-CM | POA: Diagnosis not present

## 2022-02-21 DIAGNOSIS — F419 Anxiety disorder, unspecified: Secondary | ICD-10-CM | POA: Diagnosis not present

## 2022-02-27 DIAGNOSIS — E109 Type 1 diabetes mellitus without complications: Secondary | ICD-10-CM | POA: Diagnosis not present

## 2022-02-27 DIAGNOSIS — M6281 Muscle weakness (generalized): Secondary | ICD-10-CM | POA: Diagnosis not present

## 2022-02-27 DIAGNOSIS — L89890 Pressure ulcer of other site, unstageable: Secondary | ICD-10-CM | POA: Diagnosis not present

## 2022-02-28 DIAGNOSIS — G8191 Hemiplegia, unspecified affecting right dominant side: Secondary | ICD-10-CM | POA: Diagnosis not present

## 2022-02-28 DIAGNOSIS — E785 Hyperlipidemia, unspecified: Secondary | ICD-10-CM | POA: Diagnosis not present

## 2022-02-28 DIAGNOSIS — I10 Essential (primary) hypertension: Secondary | ICD-10-CM | POA: Diagnosis not present

## 2022-02-28 DIAGNOSIS — F419 Anxiety disorder, unspecified: Secondary | ICD-10-CM | POA: Diagnosis not present

## 2022-02-28 DIAGNOSIS — E119 Type 2 diabetes mellitus without complications: Secondary | ICD-10-CM | POA: Diagnosis not present

## 2022-03-06 ENCOUNTER — Other Ambulatory Visit: Payer: Self-pay | Admitting: Obstetrics and Gynecology

## 2022-03-06 DIAGNOSIS — L89890 Pressure ulcer of other site, unstageable: Secondary | ICD-10-CM | POA: Diagnosis not present

## 2022-03-06 DIAGNOSIS — E109 Type 1 diabetes mellitus without complications: Secondary | ICD-10-CM | POA: Diagnosis not present

## 2022-03-06 DIAGNOSIS — M6281 Muscle weakness (generalized): Secondary | ICD-10-CM | POA: Diagnosis not present

## 2022-03-06 NOTE — Patient Outreach (Cosign Needed)
Care Coordination  03/06/2022  NASSIM COSMA 06-Dec-1965 [id number]   Medicaid Managed Care   Unsuccessful Outreach Note  03/06/2022 Name: MERVIN RAMIRES MRN: [id number] DOB: [birth year]  Referred by: Armando Gang, FNP Reason for referral : High Risk Managed Medicaid (Unsuccessful telephone outreach)   Third unsuccessful telephone outreach was attempted today. The patient was referred to the case management team for assistance with care management and care coordination. The patient's primary care provider has been notified of our unsuccessful attempts to make or maintain contact with the patient. The care management team is pleased to engage with this patient at any time in the future should he/she be interested in assistance from the care management team.   Follow Up Plan: We have been unable to make contact with the patient for follow up. The care management team is available to follow up with the patient after provider conversation with the patient regarding recommendation for care management engagement and subsequent re-referral to the care management team.   Kathi Der RN, BSN [place]  [facility] Care Management Coordinator - Managed [place] High Risk [phone]

## 2022-03-06 NOTE — Patient Instructions (Signed)
Visit Information  Mr. BRIANA NEWMAN  - as a part of your Medicaid benefit, you are eligible for care management and care coordination services at no cost or copay. I was unable to reach you by phone today but would be happy to help you with your health related needs. Please feel free to call me at [phone].   Kathi Der RN, BSN [place]  [facility] - Managed Medicaid High Risk [phone]

## 2022-03-13 DIAGNOSIS — M6281 Muscle weakness (generalized): Secondary | ICD-10-CM | POA: Diagnosis not present

## 2022-03-13 DIAGNOSIS — E109 Type 1 diabetes mellitus without complications: Secondary | ICD-10-CM | POA: Diagnosis not present

## 2022-03-13 DIAGNOSIS — L89890 Pressure ulcer of other site, unstageable: Secondary | ICD-10-CM | POA: Diagnosis not present

## 2022-03-20 DIAGNOSIS — L89890 Pressure ulcer of other site, unstageable: Secondary | ICD-10-CM | POA: Diagnosis not present

## 2022-03-20 DIAGNOSIS — M6281 Muscle weakness (generalized): Secondary | ICD-10-CM | POA: Diagnosis not present

## 2022-03-20 DIAGNOSIS — E109 Type 1 diabetes mellitus without complications: Secondary | ICD-10-CM | POA: Diagnosis not present

## 2022-03-21 DIAGNOSIS — E785 Hyperlipidemia, unspecified: Secondary | ICD-10-CM | POA: Diagnosis not present

## 2022-03-21 DIAGNOSIS — F419 Anxiety disorder, unspecified: Secondary | ICD-10-CM | POA: Diagnosis not present

## 2022-03-21 DIAGNOSIS — E119 Type 2 diabetes mellitus without complications: Secondary | ICD-10-CM | POA: Diagnosis not present

## 2022-03-21 DIAGNOSIS — T07XXXA Unspecified multiple injuries, initial encounter: Secondary | ICD-10-CM | POA: Diagnosis not present

## 2022-03-21 DIAGNOSIS — I10 Essential (primary) hypertension: Secondary | ICD-10-CM | POA: Diagnosis not present

## 2022-03-23 DIAGNOSIS — I10 Essential (primary) hypertension: Secondary | ICD-10-CM | POA: Diagnosis not present

## 2022-03-25 ENCOUNTER — Ambulatory Visit: Payer: Self-pay

## 2022-03-25 ENCOUNTER — Encounter: Payer: Self-pay | Admitting: Physician Assistant

## 2022-03-25 ENCOUNTER — Ambulatory Visit (INDEPENDENT_AMBULATORY_CARE_PROVIDER_SITE_OTHER): Payer: Medicaid Other | Admitting: Physician Assistant

## 2022-03-25 DIAGNOSIS — M25511 Pain in right shoulder: Secondary | ICD-10-CM | POA: Diagnosis not present

## 2022-03-25 DIAGNOSIS — M25512 Pain in left shoulder: Secondary | ICD-10-CM | POA: Diagnosis not present

## 2022-03-25 NOTE — Progress Notes (Signed)
Office Visit Note   Patient: Brandon [name]           Date of Birth: 1966-07-31           MRN: [id number] Visit Date: 03/25/2022              Requested by: Armando Gang, FNP [address] [place],  [place] [address] PCP: Armando Gang, FNP  Chief Complaint  Patient presents with   Left Shoulder - New Patient (Initial Visit)   Right Shoulder - New Patient (Initial Visit)      HPI: The patient is here today with a caregiver.  He is currently residing at a rehab facility.  He is complaining of a 1 year history of left shoulder pain.  He does have a history of multiple falls and alcohol abuse.  He is also an uncontrolled diabetic and was hospitalized in April for diabetic ketoacidosis with blood sugars over 500.  Most recent A1c that I could find was over 13.  He also has a history of GI bleeds.  He says that his shoulder pain makes it difficult for him to move the shoulder.  Assessment & Plan: Visit Diagnoses:  1. Acute pain of right shoulder   2. Acute pain of left shoulder     Plan: Given his history x-rays reviewed do not show any evidence of osseous or fracture issues.  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  We will go forward and have him work specifically on rotator cuff strengthening and range of motion at his rehab facility.  Unfortunately I cannot give him a cortisone injection given his recent history of DKA.  Also with his GI bleed and alcohol history difficult to prescribe any anti-inflammatories  Follow-Up Instructions: No follow-ups on file.   Ortho Exam  Patient is alert, oriented, no adenopathy, well-dressed, normal affect, normal respiratory effort. Examination of his left shoulder he is thin.  He is able to raise his hand to about 90 degrees but then too painful I can raise it about 20 degrees more but then am unable to raise it all the way.  He is very hesitant doing range of motion.  His distal grip  strength is good his sensation is intact.  He resists abduction and internal rotation behind his back because he says it simply too painful  Imaging: No results found. No images are attached to the encounter.  Labs: Lab Results  Component Value Date   HGBA1C 13.1 (H) 01/08/2022   HGBA1C 14.9 (H) 12/22/2020   HGBA1C 15.2 (H) 12/21/2020   ESRSEDRATE 36 (H) 03/15/2020   ESRSEDRATE 80 (H) 02/08/2020   ESRSEDRATE 105 (H) 02/06/2020   CRP 4.4 03/15/2020   CRP 6.7 (H) 02/08/2020   CRP 5.0 (H) 02/06/2020   REPTSTATUS 02/13/2020 FINAL 02/08/2020   GRAMSTAIN  02/08/2020    ABUNDANT WBC PRESENT, PREDOMINANTLY PMN ABUNDANT GRAM POSITIVE COCCI IN PAIRS IN CHAINS    CULT  02/08/2020    FEW STREPTOCOCCUS PNEUMONIAE NO ANAEROBES ISOLATED Performed at [facility], [address] [place], [place] [address]    [facility] STREPTOCOCCUS PNEUMONIAE 02/08/2020     Lab Results  Component Value Date   ALBUMIN 2.4 (L) 01/15/2022   ALBUMIN 4.0 01/14/2022   ALBUMIN 4.0 01/07/2022   PREALBUMIN 10.5 (L) 02/07/2020    Lab Results  Component Value Date   MG 1.8 01/16/2022   MG 1.3 (L) 01/15/2022  MG 1.6 (L) 12/21/2020   No results found for: "VD25OH"  Lab Results  Component Value Date   PREALBUMIN 10.5 (L) 02/07/2020      Latest Ref Rng & Units 01/16/2022    3:19 AM 01/15/2022    5:02 AM 01/15/2022    3:16 AM  CBC EXTENDED  WBC 4.0 - 10.5 K/uL 7.0     RBC 4.22 - 5.81 MIL/uL 3.04  3.03    Hemoglobin 13.0 - 17.0 g/dL 9.6   9.7   HCT 16.1 - 52.0 % 28.6   28.5   Platelets 150 - 400 K/uL 272     NEUT# 1.7 - 7.7 K/uL 4.6     Lymph# 0.7 - 4.0 K/uL 1.5        There is no height or weight on file to calculate BMI.  Orders:  No orders of the defined types were placed in this encounter.  No orders of the defined types were placed in this encounter.    Procedures: No procedures performed  Clinical Data: No additional findings.  ROS:  All other systems negative, except as  noted in the HPI. Review of Systems  Objective: Vital Signs: There were no vitals taken for this visit.  Specialty Comments:  No specialty comments available.  PMFS History: Patient Active Problem List   Diagnosis Date Noted   Pain in left shoulder 03/25/2022   Pressure injury of skin 01/08/2022   Frequent falls 01/08/2022   DKA (diabetic ketoacidosis) (HCC) 01/07/2022   Rhabdomyolysis 01/07/2022   Protein-calorie malnutrition, severe 12/23/2020   Hyperosmolar hyperglycemic state (HHS) (HCC) 12/22/2020   Hyperglycemia due to type 2 diabetes mellitus (HCC) 12/21/2020   Hyperglycemia due to diabetes mellitus (HCC) 12/21/2020   Empyema lung (HCC)    Epidural abscess    Diskitis 02/06/2020   Alcohol withdrawal delirium (HCC) 02/06/2020   Epilepsia (HCC) 02/06/2020   Pleural effusion on right 02/06/2020   History of stroke 01/23/2017   Dry eyes    Sleep disturbance    Essential hypertension, benign    Upper GI bleed    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   Fatty liver    Tobacco abuse    Diabetes mellitus type 2 in nonobese (HCC)    History of CVA with residual deficit    Acute lower UTI    Tachycardia    Chronic alcoholic pancreatitis (HCC)    Hyponatremia 03/09/2016   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   Acute blood loss anemia 03/09/2016   Gastric varices    Alcohol abuse    Left-sided neglect    Severe anemia    UGIB (upper gastrointestinal bleed)    Pressure ulcer 03/06/2016   Acute encephalopathy    CVA (cerebral infarction) 03/05/2016   Carotid stenosis 04/06/2015   CAD in native artery 03/16/2015   Unstable angina pectoris (HCC) 03/15/2015   Neck pain 10/20/2013   Insomnia 12/29/2012   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 02/25/2011  Hepatic steatosis 02/25/2011   Past Medical History:  Diagnosis Date   Alcohol dependency (HCC)    Hx ETOH withdrawal seizures before 2011   CAD (coronary artery disease) 06/13/2012   Calcification noted on CTA of chest in 2012 Wall motion abnormality on ECHO     Carotid artery disease (HCC) 2016   bilateral.  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    Depression with anxiety    Diabetes mellitus without complication (HCC)    GERD (gastroesophageal reflux disease)    Headache(784.0)    migraine   Heart disease 02/2015   PCI/DES placed to RCA: on chronic Plavix/ASA   Hyperlipidemia    Hypertension    Pancreatitis 2011....    CT findings in May 2011 with inflammation and pseudocyst.  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/2016   Renal artery stenosis, native, bilateral (HCC) 02/2015    Family History  Problem Relation Age of Onset   Stroke Mother        deceased   Coronary artery disease Mother    Alcohol abuse Mother    Cancer Mother    Hypertension Father        alive   Alcohol abuse Father    Diabetes Father    Kidney disease Father    Stroke Maternal Grandmother     Past Surgical History:  Procedure Laterality Date   CARDIAC CATHETERIZATION N/A 03/15/2015   Procedure: Left Heart Cath;  Surgeon: Laurier Nancy, MD;  Location: [facility] INVASIVE CV LAB;  Service: Cardiovascular;  Laterality: N/A;   CARDIAC CATHETERIZATION N/A 03/16/2015   Procedure: Coronary Stent Intervention;  Surgeon: Alwyn Pea, MD;  Location: [facility]  INVASIVE CV LAB;  Service: Cardiovascular;  Laterality: N/A;   CAROTID ANGIOGRAM N/A 06/15/2012   Procedure: CAROTID ANGIOGRAM;  Surgeon: Chuck Hint, MD;  Location[facility] CATH LAB;  Service: Cardiovascular;  Laterality: N/A;   ESOPHAGOGASTRODUODENOSCOPY N/A 03/08/2016   Procedure: ESOPHAGOGASTRODUODENOSCOPY (EGD);  Surgeon: Ruffin Frederick, MD;  Location: [facility] ENDOSCOPY;  Service: Gastroenterology;  Laterality: N/A;   none     PERIPHERAL VASCULAR CATHETERIZATION Left 04/06/2015   Procedure: Carotid PTA/Stent Intervention;  Surgeon: Annice Needy, MD;  Location: [facility] INVASIVE CV LAB;  Service: Cardiovascular;  Laterality: Left;   Social History   Occupational History   Occupation: unemployed    Comment: previously worked at [employer]  Tobacco Use   Smoking status: Every Day    Packs/day: 1.00    Years: 30.00    Total pack years: 30.00    Types: Cigarettes   Smokeless tobacco: Never   Tobacco comments:    smoking less  Substance and Sexual Activity   Alcohol use: No    Alcohol/week: 6.0 standard drinks of alcohol    Types: 6 Cans of beer per week    Comment: drinks 6-12 beer daily   Drug use: No   Sexual activity: Yes    Birth control/protection: None

## 2022-03-28 DIAGNOSIS — E43 Unspecified severe protein-calorie malnutrition: Secondary | ICD-10-CM | POA: Diagnosis not present

## 2022-03-28 DIAGNOSIS — R5381 Other malaise: Secondary | ICD-10-CM | POA: Diagnosis not present

## 2022-03-28 DIAGNOSIS — E119 Type 2 diabetes mellitus without complications: Secondary | ICD-10-CM | POA: Diagnosis not present

## 2022-03-28 DIAGNOSIS — E785 Hyperlipidemia, unspecified: Secondary | ICD-10-CM | POA: Diagnosis not present

## 2022-04-15 DIAGNOSIS — E119 Type 2 diabetes mellitus without complications: Secondary | ICD-10-CM | POA: Diagnosis not present

## 2022-04-15 DIAGNOSIS — E785 Hyperlipidemia, unspecified: Secondary | ICD-10-CM | POA: Diagnosis not present

## 2022-04-15 DIAGNOSIS — R5381 Other malaise: Secondary | ICD-10-CM | POA: Diagnosis not present

## 2022-04-15 DIAGNOSIS — E43 Unspecified severe protein-calorie malnutrition: Secondary | ICD-10-CM | POA: Diagnosis not present

## 2022-04-24 DIAGNOSIS — F329 Major depressive disorder, single episode, unspecified: Secondary | ICD-10-CM | POA: Diagnosis not present

## 2022-04-24 DIAGNOSIS — E785 Hyperlipidemia, unspecified: Secondary | ICD-10-CM | POA: Diagnosis not present

## 2022-04-24 DIAGNOSIS — I2510 Atherosclerotic heart disease of native coronary artery without angina pectoris: Secondary | ICD-10-CM | POA: Diagnosis not present

## 2022-04-24 DIAGNOSIS — G4700 Insomnia, unspecified: Secondary | ICD-10-CM | POA: Diagnosis not present

## 2022-04-24 DIAGNOSIS — I200 Unstable angina: Secondary | ICD-10-CM | POA: Diagnosis not present

## 2022-04-24 DIAGNOSIS — E119 Type 2 diabetes mellitus without complications: Secondary | ICD-10-CM | POA: Diagnosis not present

## 2023-07-30 ENCOUNTER — Emergency Department (HOSPITAL_COMMUNITY): Payer: Medicaid Other

## 2023-07-30 ENCOUNTER — Other Ambulatory Visit: Payer: Self-pay

## 2023-07-30 ENCOUNTER — Observation Stay (HOSPITAL_COMMUNITY): Payer: Medicaid Other

## 2023-07-30 ENCOUNTER — Observation Stay (HOSPITAL_COMMUNITY)
Admission: EM | Admit: 2023-07-30 | Discharge: 2023-08-04 | Disposition: A | Discharge status: Skilled Nursing Facility | Payer: Medicaid Other | Attending: Internal Medicine | Admitting: Internal Medicine

## 2023-07-30 DIAGNOSIS — I2510 Atherosclerotic heart disease of native coronary artery without angina pectoris: Secondary | ICD-10-CM | POA: Insufficient documentation

## 2023-07-30 DIAGNOSIS — D649 Anemia, unspecified: Secondary | ICD-10-CM | POA: Diagnosis not present

## 2023-07-30 DIAGNOSIS — Z7985 Long-term (current) use of injectable non-insulin antidiabetic drugs: Secondary | ICD-10-CM | POA: Insufficient documentation

## 2023-07-30 DIAGNOSIS — S06360A Traumatic hemorrhage of cerebrum, unspecified, without loss of consciousness, initial encounter: Principal | ICD-10-CM | POA: Insufficient documentation

## 2023-07-30 DIAGNOSIS — G40909 Epilepsy, unspecified, not intractable, without status epilepticus: Secondary | ICD-10-CM | POA: Insufficient documentation

## 2023-07-30 DIAGNOSIS — Z79899 Other long term (current) drug therapy: Secondary | ICD-10-CM | POA: Diagnosis not present

## 2023-07-30 DIAGNOSIS — Z794 Long term (current) use of insulin: Secondary | ICD-10-CM | POA: Diagnosis not present

## 2023-07-30 DIAGNOSIS — I10 Essential (primary) hypertension: Secondary | ICD-10-CM | POA: Diagnosis not present

## 2023-07-30 DIAGNOSIS — K8689 Other specified diseases of pancreas: Secondary | ICD-10-CM | POA: Insufficient documentation

## 2023-07-30 DIAGNOSIS — Z7902 Long term (current) use of antithrombotics/antiplatelets: Secondary | ICD-10-CM | POA: Diagnosis not present

## 2023-07-30 DIAGNOSIS — I619 Nontraumatic intracerebral hemorrhage, unspecified: Secondary | ICD-10-CM | POA: Diagnosis present

## 2023-07-30 DIAGNOSIS — M25512 Pain in left shoulder: Secondary | ICD-10-CM | POA: Insufficient documentation

## 2023-07-30 DIAGNOSIS — Z7982 Long term (current) use of aspirin: Secondary | ICD-10-CM | POA: Diagnosis not present

## 2023-07-30 DIAGNOSIS — F17210 Nicotine dependence, cigarettes, uncomplicated: Secondary | ICD-10-CM | POA: Insufficient documentation

## 2023-07-30 DIAGNOSIS — W19XXXA Unspecified fall, initial encounter: Principal | ICD-10-CM

## 2023-07-30 DIAGNOSIS — Z7984 Long term (current) use of oral hypoglycemic drugs: Secondary | ICD-10-CM | POA: Insufficient documentation

## 2023-07-30 DIAGNOSIS — W01198A Fall on same level from slipping, tripping and stumbling with subsequent striking against other object, initial encounter: Secondary | ICD-10-CM | POA: Insufficient documentation

## 2023-07-30 DIAGNOSIS — S0636AA Traumatic hemorrhage of cerebrum, unspecified, with loss of consciousness status unknown, initial encounter: Secondary | ICD-10-CM

## 2023-07-30 DIAGNOSIS — Z8673 Personal history of transient ischemic attack (TIA), and cerebral infarction without residual deficits: Secondary | ICD-10-CM | POA: Insufficient documentation

## 2023-07-30 DIAGNOSIS — E43 Unspecified severe protein-calorie malnutrition: Secondary | ICD-10-CM | POA: Insufficient documentation

## 2023-07-30 DIAGNOSIS — S066X0A Traumatic subarachnoid hemorrhage without loss of consciousness, initial encounter: Secondary | ICD-10-CM | POA: Insufficient documentation

## 2023-07-30 DIAGNOSIS — E119 Type 2 diabetes mellitus without complications: Secondary | ICD-10-CM | POA: Diagnosis not present

## 2023-07-30 DIAGNOSIS — R519 Headache, unspecified: Secondary | ICD-10-CM | POA: Diagnosis present

## 2023-07-30 DIAGNOSIS — S01112A Laceration without foreign body of left eyelid and periocular area, initial encounter: Secondary | ICD-10-CM | POA: Diagnosis not present

## 2023-07-30 DIAGNOSIS — K5900 Constipation, unspecified: Secondary | ICD-10-CM | POA: Diagnosis not present

## 2023-07-30 LAB — CBC WITH DIFFERENTIAL/PLATELET
Abs Immature Granulocytes: 0.03 10*3/uL (ref 0.00–0.07)
Basophils Absolute: 0.1 10*3/uL (ref 0.0–0.1)
Basophils Relative: 1 %
Eosinophils Absolute: 0.1 10*3/uL (ref 0.0–0.5)
Eosinophils Relative: 1 %
HCT: 34.8 % — ABNORMAL LOW (ref 39.0–52.0)
Hemoglobin: 11.2 g/dL — ABNORMAL LOW (ref 13.0–17.0)
Immature Granulocytes: 0 %
Lymphocytes Relative: 23 %
Lymphs Abs: 1.8 10*3/uL (ref 0.7–4.0)
MCH: 31.4 pg (ref 26.0–34.0)
MCHC: 32.2 g/dL (ref 30.0–36.0)
MCV: 97.5 fL (ref 80.0–100.0)
Monocytes Absolute: 0.4 10*3/uL (ref 0.1–1.0)
Monocytes Relative: 5 %
Neutro Abs: 5.4 10*3/uL (ref 1.7–7.7)
Neutrophils Relative %: 70 %
Platelets: 300 10*3/uL (ref 150–400)
RBC: 3.57 MIL/uL — ABNORMAL LOW (ref 4.22–5.81)
RDW: 14.9 % (ref 11.5–15.5)
WBC: 7.7 10*3/uL (ref 4.0–10.5)
nRBC: 0 % (ref 0.0–0.2)

## 2023-07-30 LAB — COMPREHENSIVE METABOLIC PANEL
ALT: 16 U/L (ref 0–44)
AST: 15 U/L (ref 15–41)
Albumin: 3.4 g/dL — ABNORMAL LOW (ref 3.5–5.0)
Alkaline Phosphatase: 252 U/L — ABNORMAL HIGH (ref 38–126)
Anion gap: 12 (ref 5–15)
BUN: 11 mg/dL (ref 6–20)
CO2: 25 mmol/L (ref 22–32)
Calcium: 9.4 mg/dL (ref 8.9–10.3)
Chloride: 102 mmol/L (ref 98–111)
Creatinine, Ser: 0.77 mg/dL (ref 0.61–1.24)
GFR, Estimated: >60 mL/min (ref >60)
Glucose, Bld: 161 mg/dL — ABNORMAL HIGH (ref 70–99)
Potassium: 4.4 mmol/L (ref 3.5–5.1)
Sodium: 139 mmol/L (ref 135–145)
Total Bilirubin: 0.6 mg/dL (ref <1.2)
Total Protein: 6.6 g/dL (ref 6.5–8.1)

## 2023-07-30 MED ORDER — LIDOCAINE 5 % EX PTCH
2.0000 | MEDICATED_PATCH | CUTANEOUS | Status: DC
  Administered 2023-08-01 – 2023-08-03 (×2): 2 via TRANSDERMAL
  Filled 2023-07-30 (×4): qty 2

## 2023-07-30 MED ORDER — NICOTINE 14 MG/24HR TD PT24
14.0000 mg | MEDICATED_PATCH | Freq: Every day | TRANSDERMAL | Status: DC
  Administered 2023-07-30 – 2023-08-04 (×6): 14 mg via TRANSDERMAL
  Filled 2023-07-30 (×6): qty 1

## 2023-07-30 MED ORDER — LACTATED RINGERS IV BOLUS
1000.0000 mL | Freq: Once | INTRAVENOUS | Status: AC
Start: 2023-07-30 — End: 2023-07-30
  Administered 2023-07-30: 1000 mL via INTRAVENOUS

## 2023-07-30 MED ORDER — ACETAMINOPHEN 325 MG PO TABS
650.0000 mg | ORAL_TABLET | Freq: Four times a day (QID) | ORAL | Status: DC | PRN
  Administered 2023-07-31: 650 mg via ORAL
  Filled 2023-07-30: qty 2

## 2023-07-30 MED ORDER — ROSUVASTATIN CALCIUM 5 MG PO TABS
5.0000 mg | ORAL_TABLET | Freq: Every day | ORAL | Status: DC
  Administered 2023-07-31 – 2023-08-03 (×4): 5 mg via ORAL
  Filled 2023-07-30 (×4): qty 1

## 2023-07-30 MED ORDER — METOPROLOL TARTRATE 25 MG PO TABS
100.0000 mg | ORAL_TABLET | Freq: Every day | ORAL | Status: DC
  Administered 2023-07-31 – 2023-08-04 (×5): 100 mg via ORAL
  Filled 2023-07-30 (×5): qty 4

## 2023-07-30 MED ORDER — FERROUS SULFATE 325 (65 FE) MG PO TABS
325.0000 mg | ORAL_TABLET | Freq: Every day | ORAL | Status: DC
  Administered 2023-07-30 – 2023-08-04 (×6): 325 mg via ORAL
  Filled 2023-07-30 (×6): qty 1

## 2023-07-30 MED ORDER — PANCRELIPASE (LIP-PROT-AMYL) 36000-114000 UNITS PO CPEP
72000.0000 [IU] | ORAL_CAPSULE | Freq: Two times a day (BID) | ORAL | Status: DC
  Administered 2023-07-30 – 2023-07-31 (×2): 72000 [IU] via ORAL
  Filled 2023-07-30 (×3): qty 2

## 2023-07-30 MED ORDER — TRAZODONE HCL 100 MG PO TABS
150.0000 mg | ORAL_TABLET | Freq: Every day | ORAL | Status: DC
  Administered 2023-07-30 – 2023-08-03 (×5): 150 mg via ORAL
  Filled 2023-07-30 (×3): qty 1
  Filled 2023-07-30: qty 3
  Filled 2023-07-30: qty 1

## 2023-07-30 MED ORDER — ACETAMINOPHEN 650 MG RE SUPP: 650.0000 mg | Freq: Four times a day (QID) | RECTAL | Status: DC | PRN

## 2023-07-30 MED ORDER — SODIUM CHLORIDE 0.9% FLUSH
3.0000 mL | Freq: Two times a day (BID) | INTRAVENOUS | Status: DC
  Administered 2023-08-01 – 2023-08-03 (×6): 3 mL via INTRAVENOUS

## 2023-07-30 MED ORDER — GLUCERNA SHAKE PO LIQD: 237.0000 mL | Freq: Two times a day (BID) | ORAL | Status: DC

## 2023-07-30 MED ORDER — LIDOCAINE-EPINEPHRINE (PF) 2 %-1:200000 IJ SOLN
INTRAMUSCULAR | Status: AC
Start: 2023-07-30 — End: 2023-07-30
  Administered 2023-07-30: 10 mL via INTRADERMAL
  Filled 2023-07-30: qty 20

## 2023-07-30 MED ORDER — LIDOCAINE-EPINEPHRINE (PF) 2 %-1:200000 IJ SOLN: 10.0000 mL | Freq: Once | INTRAMUSCULAR | Status: AC

## 2023-07-30 MED ORDER — SENNOSIDES-DOCUSATE SODIUM 8.6-50 MG PO TABS
1.0000 | ORAL_TABLET | Freq: Every evening | ORAL | Status: DC | PRN
Start: 2023-07-30 — End: 2023-08-01

## 2023-07-30 MED ORDER — FOLIC ACID 1 MG PO TABS
1.0000 mg | ORAL_TABLET | Freq: Every day | ORAL | Status: DC
  Administered 2023-07-30 – 2023-08-04 (×6): 1 mg via ORAL
  Filled 2023-07-30 (×6): qty 1

## 2023-07-30 MED ORDER — TAMSULOSIN HCL 0.4 MG PO CAPS
0.4000 mg | ORAL_CAPSULE | Freq: Every day | ORAL | Status: DC
  Administered 2023-07-31 – 2023-08-03 (×4): 0.4 mg via ORAL
  Filled 2023-07-30 (×4): qty 1

## 2023-07-30 MED ORDER — LEVETIRACETAM 500 MG PO TABS
500.0000 mg | ORAL_TABLET | Freq: Two times a day (BID) | ORAL | Status: DC
  Administered 2023-07-30 – 2023-08-04 (×11): 500 mg via ORAL
  Filled 2023-07-30 (×12): qty 1

## 2023-07-30 MED ORDER — LIDOCAINE 5 % EX PTCH
1.0000 | MEDICATED_PATCH | CUTANEOUS | Status: DC
  Administered 2023-07-30 – 2023-08-03 (×3): 1 via TRANSDERMAL
  Filled 2023-07-30 (×5): qty 1

## 2023-07-30 MED ORDER — GLUCERNA SHAKE PO LIQD
237.0000 mL | Freq: Three times a day (TID) | ORAL | Status: DC
  Administered 2023-07-31: 237 mL via ORAL

## 2023-07-30 MED ORDER — INSULIN ASPART 100 UNIT/ML IJ SOLN
0.0000 [IU] | Freq: Three times a day (TID) | INTRAMUSCULAR | Status: DC
Start: 2023-07-31 — End: 2023-08-04
  Administered 2023-07-31 (×2): 2 [IU] via SUBCUTANEOUS
  Administered 2023-07-31: 3 [IU] via SUBCUTANEOUS
  Administered 2023-08-01: 2 [IU] via SUBCUTANEOUS
  Administered 2023-08-01: 9 [IU] via SUBCUTANEOUS
  Administered 2023-08-02 (×2): 7 [IU] via SUBCUTANEOUS
  Administered 2023-08-02 – 2023-08-03 (×2): 3 [IU] via SUBCUTANEOUS
  Administered 2023-08-03: 7 [IU] via SUBCUTANEOUS
  Administered 2023-08-03: 3 [IU] via SUBCUTANEOUS
  Administered 2023-08-04: 7 [IU] via SUBCUTANEOUS
  Administered 2023-08-04: 3 [IU] via SUBCUTANEOUS

## 2023-07-30 MED ORDER — INSULIN ASPART 100 UNIT/ML IJ SOLN
0.0000 [IU] | Freq: Every day | INTRAMUSCULAR | Status: DC
  Administered 2023-08-01: 5 [IU] via SUBCUTANEOUS
  Administered 2023-08-03: 3 [IU] via SUBCUTANEOUS

## 2023-07-30 MED ORDER — FAMOTIDINE 20 MG PO TABS
20.0000 mg | ORAL_TABLET | Freq: Two times a day (BID) | ORAL | Status: DC
  Administered 2023-07-30 – 2023-08-04 (×10): 20 mg via ORAL
  Filled 2023-07-30 (×10): qty 1

## 2023-07-30 NOTE — Consult Note (Addendum)
Brandon [name] 10-Feb-1966  [id number].    Requesting MD: Posey Rea, MD Chief Complaint/Reason for Consult: hypotension, fall from wheelchair  HPI:  Brandon [name] is a 57 y/o M with a history including, but not limited to, CVA and residual left sided weakness who presents from SNF after a fall from a wheelchair hitting the left site of his body on concrete. This was an unwitnessed event. He denies LOC. He denies pain. He had a forehead laceration. He currently takes plavix. He was noted to be hypotensive in the ED and was upgraded to a level 1 trauma.   ROS: Review of Systems  All other systems reviewed and are negative.   Family History  Problem Relation Age of Onset   Stroke Mother        deceased   Coronary artery disease Mother    Alcohol abuse Mother    Cancer Mother    Hypertension Father        alive   Alcohol abuse Father    Diabetes Father    Kidney disease Father    Stroke Maternal Grandmother     Past Medical History:  Diagnosis Date   Alcohol dependency (HCC)    Hx ETOH withdrawal seizures before 2011   CAD (coronary artery disease) 06/13/2012   Calcification noted on CTA of chest in 2012 Wall motion abnormality on ECHO     Carotid artery disease (HCC) 2016   bilateral.  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    Depression with anxiety    Diabetes mellitus without complication (HCC)    GERD (gastroesophageal reflux disease)    Headache(784.0)    migraine   Heart disease 02/2015   PCI/DES placed to RCA: on chronic Plavix/ASA   Hyperlipidemia    Hypertension    Pancreatitis 2011....    CT findings in May 2011 with inflammation and pseudocyst.  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/2016   Renal artery stenosis, native, bilateral (HCC) 02/2015    Past Surgical History:  Procedure Laterality Date   CARDIAC CATHETERIZATION N/A 03/15/2015   Procedure: Left Heart  Cath;  Surgeon: Laurier Nancy, MD;  Location: [facility] INVASIVE CV LAB;  Service: Cardiovascular;  Laterality: N/A;   CARDIAC CATHETERIZATION N/A 03/16/2015   Procedure: Coronary Stent Intervention;  Surgeon: Alwyn Pea, MD;  Location: [facility] INVASIVE CV LAB;  Service: Cardiovascular;  Laterality: N/A;   CAROTID ANGIOGRAM N/A 06/15/2012   Procedure: CAROTID ANGIOGRAM;  Surgeon: Chuck Hint, MD;  Location: [facility] CATH LAB;  Service: Cardiovascular;  Laterality: N/A;   ESOPHAGOGASTRODUODENOSCOPY N/A 03/08/2016   Procedure: ESOPHAGOGASTRODUODENOSCOPY (EGD);  Surgeon: Ruffin Frederick, MD;  Location: [facility] ENDOSCOPY;  Service: Gastroenterology;  Laterality: N/A;   none     PERIPHERAL VASCULAR CATHETERIZATION Left 04/06/2015   Procedure: Carotid PTA/Stent Intervention;  Surgeon: Annice Needy, MD;  Location: [facility] INVASIVE CV LAB;  Service: Cardiovascular;  Laterality: Left;    Social History:  reports that he has been smoking cigarettes. He has a 30 pack-year smoking history. He has never used smokeless tobacco. He reports that he does not drink alcohol and does not use drugs.  Allergies:  Allergies  Allergen Reactions   No Known Allergies     (Not in a hospital admission)    Physical Exam: Blood pressure 126/83, pulse 79, temperature 98.8 F (37.1 C), resp. rate (!) 22, SpO2 100%.  General: Pleasant white male who appears older than stated age with diffuse muscle wasting. NAD laying on hospital bed. HEENT: head -normocephalic, there a is 1 cm laceration over the left eyebrow with mild sanuinous oozing. Pupils are equal, round, reactive, ~71mm Neck- Trachea is midline CV- RRR, normal S1/S2, no M/R/G, strong femoral pulses bilaterally, no lower extremity edema  Pulm- breathing is non-labored ORA. CTABL Abd- soft, NT/ND, no masses or organomegaly. Back - no midline tenderness over the spine, no spinal stepoff, there is an abrasion with sanguinous oozing on the left shoulder. GU- normal  external anatomy  MSK- UE/LE symmetrical, no cyanosis, clubbing, or edema. Moving all extremities.  LUE - abrasions over hand, grip strength 3/5  RUE - no gross deformity, grip strength 4/5 BLE - no gross deformity, BLE motor strength in tact  Neuro- left sided weakness as above  Psych- Alert and Oriented x3 with appropriate affect Skin: warm and dry, scattered abarasions as above.   No results found for this or any previous visit (from the past 48 hour(s)). No results found.  Assessment/Plan 57 y/o M presenting s/p unwitnessed fall from a wheelchair without LOC Hypotension - resolved s/p 1 L IVF, systolic pressure 120's.  Eyebrow laceration - closure per EDP Skin abrasions - local care  Suspect hypotension precipitated his fall, his hypotension has resolved with fluid resuscitation and he has minor lacerations without external signs of major bleeding. Labs are pending. CT head/C-spine are pending. I have also ordered x-rays of the chest, pelvis, and left shoulder. We will follow the results of these studies and then make recommendations as necessary. Trauma will plan to either sign off after these studies or follow as a consulting service. Hold plavix until studies resulted.  CVA w/ residual left-sided weakness  CAD HLD DM   I reviewed nursing notes, ED provider notes, last 24 h vitals and pain scores, last 48 h intake and output, last 24 h labs and trends, and last 24 h imaging results.  Adam Phenix, [facility] Surgery 07/30/2023, 2:34 PM Please see Amion for pager number during day hours 7:00am-4:30pm or 7:00am -11:30am on weekends

## 2023-07-30 NOTE — ED Triage Notes (Addendum)
Patient BIB EMS from [facility] for fall out of wheelchair on Plavix, Hit head on concrete, LOC, Neck pain. Laceration to left eye bow, Hematoma to head. C-collar. Takes Plavix. Left hand abrasion. Baseline confusion. 140/90, 90, 98%ra, CBG

## 2023-07-30 NOTE — Hospital Course (Addendum)
Brandon [name] is a 57 y.o. with a PMH significant for CAD, prior CVA on plavix and ASA with residual deficits of intention tremor and trouble with coordination on the right, recurrent falls, T2DM, HLD who presented after a fall from [facility].   #Parenchymal Hemorrhage #Subarachnoid Hemorrhage  CT Head in the ED was concerning for parenchymal and subarachnoid hemorrhage without significant mass effect or midline shift. No acute fractures noted. Neurosurgery was consulted iand recommended repeat CT Head, and BID Keppra for 7 days for seizure prophylaxis. Repeat CT showed a slight increase in the size of the intraparenchymal bleed/hemorrhagic contusion of the medial left frontal lobe (34mm-13mm). The hemorrhagic foci in the frontal lobes bilaterally were unchanged from prior CT.   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 Has been getting lidocaine patches at SNF. Ortho recommended a sling for comfort but can ROM and WB as tolerated. F/u with ortho in 1 month if still symptomatic.    -can use Tylenol 1000 mg Q6 PRN -Lidocaine patch 5% -Oxycodone 5 mg Q6 PRN for pain -Sling for comfort, can WB and ROM as tolerated  -FU with ortho if symptoms are still persistent   DM Fasting glucose is 169 today. Likely due to decreased PO intake. He will remain on SSI today. At home he is on Lantus 27 units at bedtime, Humalog SSI, Metformin 1000 mg BID and Trulicity 1.5 mg every Tuesday.   Pancreatic Insufficiency RD was consulted. Recommended Creon 36,000 units TID  -cw same  Severe malnutrition  RD consulted, appreciate recs.  MV with minerals  Ensure Enlive and increase to TID with meals, each supplement provides 350 kcal and 20 grams of protein. Pt prefers strawberry.   Anemia Patient Hgb  11.2 on admission. Hgb 11.4 today. -Continue Ferrous sulfate 325 mg -Continue Folic acid 1 mg  -Continue to monitor CBC  BPH -Continue Flomax 0.4 mg daily  Chronic Conditions: HTN HLD -Continue Lopressor 100 mg -Continue Crestor 5 mg   GERD -Continue Pepcid 20 mg   GAD -Continue Buspirone 10 mg BID -Trazodone 150 mg daily at bedtime   Hx of Stroke CAD s/p DES -Holding home Plavix for 2 weeks -Holding home ASA 81 for 2 weeks   Constipation  -On Senna S BID today as he had not had any BM for a few days.

## 2023-07-30 NOTE — ED Notes (Signed)
Pt had a bowel movement.

## 2023-07-30 NOTE — Progress Notes (Signed)
Pt. Fail out of wheelchair.  Chaplain provided emotional support.  Chaplain will follow as needed.  Venida Jarvis, [place], B [facility], Pager [phone]

## 2023-07-30 NOTE — ED Provider Notes (Signed)
Care of patient assumed from Dr. Posey Rea.  This patient presented after a fall out of wheelchair.  He did strike his head and is found to have traumatic ICH on CT head.  Neurosurgery has recommended medicine admission, twice daily Keppra, and 8-hour repeat CT head.  He is currently awaiting results of x-rays. Physical Exam  BP (!) 142/106   Pulse 77   Temp 98.8 F (37.1 C)   Resp (!) 21   SpO2 100%   Physical Exam Vitals and nursing note reviewed.  Constitutional:      General: He is not in acute distress.    Appearance: Normal appearance. He is well-developed. He is not ill-appearing or toxic-appearing.  HENT:     Head: Normocephalic.     Comments: Injury to left temporal area    Right Ear: External ear normal.     Left Ear: External ear normal.     Nose: Nose normal.  Eyes:     Extraocular Movements: Extraocular movements intact.     Conjunctiva/sclera: Conjunctivae normal.  Cardiovascular:     Rate and Rhythm: Normal rate and regular rhythm.  Pulmonary:     Effort: Pulmonary effort is normal. No respiratory distress.  Abdominal:     General: There is no distension.     Palpations: Abdomen is soft.  Musculoskeletal:        General: No swelling.     Cervical back: Neck supple.  Skin:    General: Skin is warm and dry.     Coloration: Skin is not jaundiced or pale.  Neurological:     General: No focal deficit present.     Mental Status: He is alert.  Psychiatric:        Mood and Affect: Mood normal.        Behavior: Behavior normal.     Procedures  Procedures  ED Course / MDM    Medical Decision Making Amount and/or Complexity of Data Reviewed Labs: ordered. Radiology: ordered.  Risk Prescription drug management. Decision regarding hospitalization.   X-ray findings audible for small ossific density adjacent to greater tuberosity of left humerus.  Patient was admitted for further management.       Gloris Manchester, MD 07/30/23 [phone]

## 2023-07-30 NOTE — ED Notes (Signed)
If pt gets moved to floor, please notify sister of room information.

## 2023-07-30 NOTE — ED Notes (Signed)
ED TO INPATIENT HANDOFF REPORT  ED Nurse Name and Phone #:  Minerva Areola [phone]  I Name/Age/Gender Brandon [name] 57 y.o. male Room/Bed: 045C/045C  Code Status   Code Status: Full Code  Home/SNF/Other Home Patient oriented to: self, place, time, and situation Is this baseline? Yes   Triage Complete: Triage complete  Chief Complaint ICH (intracerebral hemorrhage) (HCC) [I61.9]  Triage Note Patient BIB EMS from [facility] for fall out of wheelchair on Plavix, Hit head on concrete, LOC, Neck pain. Laceration to left eye bow, Hematoma to head. C-collar. Takes Plavix. Left hand abrasion. Baseline confusion. 140/90, 90, 98%ra, CBG     Allergies No Known Allergies  Level of Care/Admitting Diagnosis ED Disposition     ED Disposition  Admit   Condition  --   Comment  Hospital Area: [facility] [[id number]]  Level of Care: Progressive [102]  Admit to Progressive based on following criteria: NEUROLOGICAL AND NEUROSURGICAL complex patients with significant risk of instability, who do not meet ICU criteria, yet require close observation or frequent assessment (< / = every 2 - 4 hours) with medical / nursing intervention.  May place patient in observation at [facility] or [facility] if equivalent level of care is available:: No  Covid Evaluation: Asymptomatic - no recent exposure (last 10 days) testing not required  Diagnosis: ICH (intracerebral hemorrhage) [facility]) [[id number]]  Admitting Physician: Earl Lagos [[id number]]  Attending Physician: Earl Lagos [phone]          B Medical/Surgery History Past Medical History:  Diagnosis Date   Alcohol dependency (HCC)    Hx ETOH withdrawal seizures before 2011   CAD (coronary artery disease) 06/13/2012   Calcification noted on CTA of chest in 2012 Wall motion abnormality on ECHO     Carotid artery disease (HCC) 2016   bilateral.  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    Depression with anxiety    Diabetes mellitus without complication (HCC)    GERD (gastroesophageal reflux disease)    Headache(784.0)    migraine   Heart disease 02/2015   PCI/DES placed to RCA: on chronic Plavix/ASA   Hyperlipidemia    Hypertension    Pancreatitis 2011....    CT findings in May 2011 with inflammation and pseudocyst.  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/2016   Renal artery stenosis, native, bilateral (HCC) 02/2015   Past Surgical History:  Procedure Laterality Date   CARDIAC CATHETERIZATION N/A 03/15/2015   Procedure: Left Heart Cath;  Surgeon: Laurier Nancy, MD;  Location: [facility] INVASIVE CV LAB;  Service: Cardiovascular;  Laterality: N/A;   CARDIAC CATHETERIZATION N/A 03/16/2015   Procedure: Coronary Stent Intervention;  Surgeon: Alwyn Pea, MD;  Location: [facility] INVASIVE CV LAB;  Service: Cardiovascular;  Laterality: N/A;   CAROTID ANGIOGRAM N/A 06/15/2012   Procedure: CAROTID ANGIOGRAM;  Surgeon: Chuck Hint, MD;  Location: [facility] CATH LAB;  Service: Cardiovascular;  Laterality: N/A;   ESOPHAGOGASTRODUODENOSCOPY N/A 03/08/2016   Procedure: ESOPHAGOGASTRODUODENOSCOPY (EGD);  Surgeon: Ruffin Frederick, MD;  Location: [facility] ENDOSCOPY;  Service: Gastroenterology;  Laterality: N/A;   none     PERIPHERAL VASCULAR CATHETERIZATION Left 04/06/2015   Procedure: Carotid PTA/Stent Intervention;  Surgeon: Annice Needy, MD;  Location: [facility] INVASIVE CV LAB;  Service: Cardiovascular;  Laterality: Left;     A IV Location/Drains/Wounds Patient Lines/Drains/Airways Status     Active Line/Drains/Airways  Name Placement date Placement time Site Days   Peripheral IV 07/30/23 18 G Anterior;Distal;Right;Upper Arm 07/30/23  1421  Arm  less than 1   Peripheral IV 07/30/23 20 G Anterior;Distal;Left;Upper Arm 07/30/23  1428  Arm  less than 1   External Urinary Catheter 01/14/22  0405  --  562   Pressure Injury 01/07/22 Coccyx  Medial Stage 2 -  Partial thickness loss of dermis presenting as a shallow open injury with a red, pink wound bed without slough. discolored 01/07/22  2045  -- 569   Pressure Injury 01/14/22 Shoulder Left;Posterior Stage 2 -  Partial thickness loss of dermis presenting as a shallow open injury with a red, pink wound bed without slough. 01/14/22  1800  -- 562   Pressure Injury 01/14/22 Hip Lateral;Right Stage 2 -  Partial thickness loss of dermis presenting as a shallow open injury with a red, pink wound bed without slough. 01/14/22  1800  -- 562   Pressure Injury 01/14/22 Knee Lateral;Right Unstageable - Full thickness tissue loss in which the base of the injury is covered by slough (yellow, tan, gray, green or brown) and/or eschar (tan, brown or black) in the wound bed. 01/14/22  1800  -- 562            Intake/Output Last 24 hours No intake or output data in the 24 hours ending 07/30/23 2357  Labs/Imaging Results for orders placed or performed during the hospital encounter of 07/30/23 (from the past 48 hour(s))  CBC with Differential     Status: Abnormal   Collection Time: 07/30/23  2:24 PM  Result Value Ref Range   WBC 7.7 4.0 - 10.5 K/uL   RBC 3.57 (L) 4.22 - 5.81 MIL/uL   Hemoglobin 11.2 (L) 13.0 - 17.0 g/dL   HCT 96.0 (L) 45.4 - 09.8 %   MCV 97.5 80.0 - 100.0 fL   MCH 31.4 26.0 - 34.0 pg   MCHC 32.2 30.0 - 36.0 g/dL   RDW 11.9 14.7 - 82.9 %   Platelets 300 150 - 400 K/uL   nRBC 0.0 0.0 - 0.2 %   Neutrophils Relative % 70 %   Neutro Abs 5.4 1.7 - 7.7 K/uL   Lymphocytes Relative 23 %   Lymphs Abs 1.8 0.7 - 4.0 K/uL   Monocytes Relative 5 %   Monocytes Absolute 0.4 0.1 - 1.0 K/uL   Eosinophils Relative 1 %   Eosinophils Absolute 0.1 0.0 - 0.5 K/uL   Basophils Relative 1 %   Basophils Absolute 0.1 0.0 - 0.1 K/uL   Immature Granulocytes 0 %   Abs Immature Granulocytes 0.03 0.00 - 0.07 K/uL    Comment: Performed at [facility], [address] [place], [place] [address]   Comprehensive metabolic panel     Status: Abnormal   Collection Time: 07/30/23  2:24 PM  Result Value Ref Range   Sodium 139 135 - 145 mmol/L   Potassium 4.4 3.5 - 5.1 mmol/L   Chloride 102 98 - 111 mmol/L   CO2 25 22 - 32 mmol/L   Glucose, Bld 161 (H) 70 - 99 mg/dL    Comment: Glucose reference range applies only to samples taken after fasting for at least 8 hours.   BUN 11 6 - 20 mg/dL   Creatinine, Ser 0.86 0.61 - 1.24 mg/dL   Calcium 9.4 8.9 - 57.8 mg/dL   Total Protein 6.6 6.5 - 8.1 g/dL   Albumin 3.4 (L) 3.5 - 5.0 g/dL  AST 15 15 - 41 U/L   ALT 16 0 - 44 U/L   Alkaline Phosphatase 252 (H) 38 - 126 U/L   Total Bilirubin 0.6 <1.2 mg/dL   GFR, Estimated >27 >25 mL/min    Comment: (NOTE) Calculated using the CKD-EPI Creatinine Equation (2021)    Anion gap 12 5 - 15    Comment: Performed at [facility], [address], [place], [place] [address]   CT Head Wo Contrast  Result Date: 07/30/2023 CLINICAL DATA:  Head trauma.  Follow-up intracranial bleed. EXAM: CT HEAD WITHOUT CONTRAST TECHNIQUE: Contiguous axial images were obtained from the base of the skull through the vertex without intravenous contrast. RADIATION DOSE REDUCTION: This exam was performed according to the departmental dose-optimization program which includes automated exposure control, adjustment of the mA and/or kV according to patient size and/or use of iterative reconstruction technique. COMPARISON:  Earlier CT dated 07/30/2023. FINDINGS: Brain: Slight interval increase in the size of intraparenchymal bleed/hemorrhagic contusion of the medial left frontal lobe (15/2) measuring 13 mm in diameter (previously approximately 7 mm). Additional hemorrhagic foci in the frontal lobes bilaterally are relatively similar to prior CT. There is moderate age-related atrophy and chronic microvascular ischemic changes. Old right frontal infarct and encephalomalacia. No mass effect or midline shift. No extra-axial fluid  collection. Vascular: No hyperdense vessel or unexpected calcification. Skull: Normal. Negative for fracture or focal lesion. Sinuses/Orbits: No acute finding. Other: None IMPRESSION: 1. Slight interval increase in the size of intraparenchymal bleed/hemorrhagic contusion of the medial left frontal lobe. 2. Additional hemorrhagic foci in the frontal lobes bilaterally are relatively similar to prior CT. 3. Moderate age-related atrophy and chronic microvascular ischemic changes. Old right frontal infarct and encephalomalacia. Electronically Signed   By: Elgie Collard M.D.   On: 07/30/2023 22:47   DG Knee 1-2 Views Left  Result Date: 07/30/2023 CLINICAL DATA:  Fall.  Left knee pain. EXAM: LEFT KNEE - 1-2 VIEW COMPARISON:  None Available. FINDINGS: No acute fracture or dislocation. No aggressive osseous lesion. No significant arthritis of the knee joint. No knee effusion or focal soft tissue swelling. No radiopaque foreign bodies. IMPRESSION: No acute osseous abnormality Electronically Signed   By: Jules Schick M.D.   On: 07/30/2023 19:04   DG Shoulder Left Port  Result Date: 07/30/2023 CLINICAL DATA:  Larey Seat out of wheelchair, left shoulder injury and abrasion EXAM: LEFT SHOULDER COMPARISON:  01/14/2022 FINDINGS: Internal rotation, external rotation, transscapular views of the left shoulder are obtained. There is a small curvilinear ossific density adjacent to the greater tuberosity of the left humerus, best seen on the internal and external rotation views. This could reflect sequela from calcific tendinopathy or small avulsion fracture given history of recent trauma. Correlation with physical exam recommended. There is mild superficial soft tissue swelling overlying the humeral head and acromion process. Mild degenerative changes of the acromioclavicular and glenohumeral joints. Left chest is clear. IMPRESSION: 1. Small ossific density adjacent to the greater tuberosity of the left humerus, which could  reflect a small acute fracture versus sequela of calcific rotator cuff tendinopathy. Please correlate with physical exam findings. 2. Mild superficial soft tissue swelling overlying the left shoulder. 3. Mild acromioclavicular and glenohumeral joint osteoarthritis. Electronically Signed   By: Sharlet Salina M.D.   On: 07/30/2023 17:09   DG Pelvis Portable  Result Date: 07/30/2023 CLINICAL DATA:  Larey Seat out of wheelchair EXAM: PORTABLE PELVIS 1-2 VIEWS COMPARISON:  06/16/2012 FINDINGS: Single frontal view of the pelvis includes both  hips. No acute fracture, subluxation, or dislocation. Mild bilateral hip osteoarthritis. Prominent spondylosis and facet hypertrophy at the lumbosacral junction. Sacroiliac joints are unremarkable. IMPRESSION: 1. Degenerative changes of the lumbar spine and bilateral hips. 2. No acute fracture. Electronically Signed   By: Sharlet Salina M.D.   On: 07/30/2023 17:07   DG CHEST PORT 1 VIEW  Result Date: 07/30/2023 CLINICAL DATA:  Larey Seat out of wheelchair, hit head EXAM: PORTABLE CHEST 1 VIEW COMPARISON:  01/14/2022 FINDINGS: Single frontal view of the chest demonstrates an unremarkable cardiac silhouette. No acute airspace disease, effusion, or pneumothorax. There are no acute displaced fractures. IMPRESSION: 1. No acute intrathoracic process. Electronically Signed   By: Sharlet Salina M.D.   On: 07/30/2023 17:06   CT Head Wo Contrast  Result Date: 07/30/2023 CLINICAL DATA:  Fall on Plavix EXAM: CT HEAD WITHOUT CONTRAST CT CERVICAL SPINE WITHOUT CONTRAST TECHNIQUE: Multidetector CT imaging of the head and cervical spine was performed following the standard protocol without intravenous contrast. Multiplanar CT image reconstructions of the cervical spine were also generated. RADIATION DOSE REDUCTION: This exam was performed according to the departmental dose-optimization program which includes automated exposure control, adjustment of the mA and/or kV according to patient size and/or  use of iterative reconstruction technique. COMPARISON:  01/07/2022 CT head and cervical spine FINDINGS: CT HEAD FINDINGS Brain: Multiple hyperdense foci in the medial frontal lobes bilaterally (series 3, images 15-20 and 25-27), most likely parenchymal and subarachnoid hemorrhage. No significant subdural or epidural collection. Redemonstrated global parenchymal volume loss with prominence the ventricles and extra-axial spaces, which appear advanced for age but unchanged from the prior exam. Encephalomalacia and gliosis in right frontal lobe is again noted. Additional remote infarct in the right caudate head. No evidence of acute infarct, mass, mass effect, or midline shift. No hydrocephalus. Vascular: No hyperdense vessel. Skull: Negative for fracture or focal lesion. Sinuses/Orbits: No acute finding. Other: The mastoid air cells are well aerated. CT CERVICAL SPINE FINDINGS Alignment: No traumatic listhesis. Trace retrolisthesis of C3 on C4, C4 on C5, and C5 on C6, which appears facet mediated. Levocurvature is likely positional. Skull base and vertebrae: No acute fracture or suspicious osseous lesion. Unchanged compression deformity of T1. Soft tissues and spinal canal: No prevertebral fluid or swelling. No visible canal hematoma. Disc levels: Degenerative changes in the cervical spine.Moderate to severe spinal canal stenosis at C3-C4 and C4-C5. Upper chest: No focal pulmonary opacity or pleural effusion. IMPRESSION: 1. Multiple hyperdense foci in the medial frontal lobes bilaterally, most likely parenchymal and subarachnoid hemorrhage. No significant subdural or epidural collection. No significant mass effect or midline shift. 2. No acute fracture or traumatic listhesis in the cervical spine. These results were called by telephone at the time of interpretation on 07/30/2023 at 3:05 pm to provider MADISON [facility] , who verbally acknowledged these results. Electronically Signed   By: Wiliam Ke M.D.   On:  07/30/2023 15:05   CT Cervical Spine Wo Contrast  Result Date: 07/30/2023 CLINICAL DATA:  Fall on Plavix EXAM: CT HEAD WITHOUT CONTRAST CT CERVICAL SPINE WITHOUT CONTRAST TECHNIQUE: Multidetector CT imaging of the head and cervical spine was performed following the standard protocol without intravenous contrast. Multiplanar CT image reconstructions of the cervical spine were also generated. RADIATION DOSE REDUCTION: This exam was performed according to the departmental dose-optimization program which includes automated exposure control, adjustment of the mA and/or kV according to patient size and/or use of iterative reconstruction technique. COMPARISON:  01/07/2022 CT head and cervical spine  FINDINGS: CT HEAD FINDINGS Brain: Multiple hyperdense foci in the medial frontal lobes bilaterally (series 3, images 15-20 and 25-27), most likely parenchymal and subarachnoid hemorrhage. No significant subdural or epidural collection. Redemonstrated global parenchymal volume loss with prominence the ventricles and extra-axial spaces, which appear advanced for age but unchanged from the prior exam. Encephalomalacia and gliosis in right frontal lobe is again noted. Additional remote infarct in the right caudate head. No evidence of acute infarct, mass, mass effect, or midline shift. No hydrocephalus. Vascular: No hyperdense vessel. Skull: Negative for fracture or focal lesion. Sinuses/Orbits: No acute finding. Other: The mastoid air cells are well aerated. CT CERVICAL SPINE FINDINGS Alignment: No traumatic listhesis. Trace retrolisthesis of C3 on C4, C4 on C5, and C5 on C6, which appears facet mediated. Levocurvature is likely positional. Skull base and vertebrae: No acute fracture or suspicious osseous lesion. Unchanged compression deformity of T1. Soft tissues and spinal canal: No prevertebral fluid or swelling. No visible canal hematoma. Disc levels: Degenerative changes in the cervical spine.Moderate to severe spinal  canal stenosis at C3-C4 and C4-C5. Upper chest: No focal pulmonary opacity or pleural effusion. IMPRESSION: 1. Multiple hyperdense foci in the medial frontal lobes bilaterally, most likely parenchymal and subarachnoid hemorrhage. No significant subdural or epidural collection. No significant mass effect or midline shift. 2. No acute fracture or traumatic listhesis in the cervical spine. These results were called by telephone at the time of interpretation on 07/30/2023 at 3:05 pm to provider MADISON [facility] , who verbally acknowledged these results. Electronically Signed   By: Wiliam Ke M.D.   On: 07/30/2023 15:05    Pending Labs Unresulted Labs (From admission, onward)     Start     Ordered   07/31/23 0500  Comprehensive metabolic panel  Tomorrow morning,   R        07/30/23 1706   07/31/23 0500  CBC  Tomorrow morning,   R        07/30/23 1706   07/30/23 1801  Hemoglobin A1c  Once,   R       Comments: To assess prior glycemic control    07/30/23 1801   07/30/23 1701  HIV Antibody (routine testing w rflx)  (HIV Antibody (Routine testing w reflex) panel)  Once,   R        07/30/23 1706            Vitals/Pain Today's Vitals   07/30/23 1919 07/30/23 1945 07/30/23 2000 07/30/23 2300  BP:   (!) 154/126 (!) 159/93  Pulse:   84 89  Resp:   12 (!) 25  Temp: 98.6 F (37 C)  98.2 F (36.8 C) 98 F (36.7 C)  TempSrc: Oral  Oral Oral  SpO2:   100% 100%  Weight:  90.7 kg    Height:  6\' 1"  (1.854 m)    PainSc:        Isolation Precautions No active isolations  Medications Medications  levETIRAcetam (KEPPRA) tablet 500 mg (500 mg Oral Not Given 07/30/23 2140)  sodium chloride flush (NS) 0.9 % injection 3 mL (3 mLs Intravenous Not Given 07/30/23 2019)  acetaminophen (TYLENOL) tablet 650 mg (has no administration in time range)    Or  acetaminophen (TYLENOL) suppository 650 mg (has no administration in time range)  senna-docusate (Senokot-S) tablet 1 tablet (has no administration in  time range)  nicotine (NICODERM CQ - dosed in mg/24 hours) patch 14 mg (14 mg Transdermal Patch Applied 07/30/23 1931)  lidocaine (LIDODERM)  5 % 2 patch (2 patches Transdermal Not Given 07/30/23 1931)  famotidine (PEPCID) tablet 20 mg (20 mg Oral Not Given 07/30/23 2139)  feeding supplement (GLUCERNA SHAKE) (GLUCERNA SHAKE) liquid 237 mL (has no administration in time range)  ferrous sulfate tablet 325 mg (325 mg Oral Given 07/30/23 1923)  folic acid (FOLVITE) tablet 1 mg (1 mg Oral Given 07/30/23 1931)  lipase/protease/amylase (CREON) capsule 72,000 Units (72,000 Units Oral Given 07/30/23 2142)  metoprolol tartrate (LOPRESSOR) tablet 100 mg (has no administration in time range)  traZODone (DESYREL) tablet 150 mg (150 mg Oral Given 07/30/23 2144)  rosuvastatin (CRESTOR) tablet 5 mg (has no administration in time range)  tamsulosin (FLOMAX) capsule 0.4 mg (has no administration in time range)  feeding supplement (GLUCERNA SHAKE) (GLUCERNA SHAKE) liquid 237 mL (has no administration in time range)  lidocaine (LIDODERM) 5 % 1 patch (1 patch Transdermal Patch Applied 07/30/23 1924)  insulin aspart (novoLOG) injection 0-9 Units (has no administration in time range)  insulin aspart (novoLOG) injection 0-5 Units ( Subcutaneous Not Given 07/30/23 2140)  lactated ringers bolus 1,000 mL (0 mLs Intravenous Stopped 07/30/23 1526)  lidocaine-EPINEPHrine (XYLOCAINE W/EPI) 2 %-1:200000 (PF) injection 10 mL (10 mLs Intradermal Given 07/30/23 1538)    Mobility walks     Focused Assessments Cardiac Assessment Handoff:  Cardiac Rhythm: Normal sinus rhythm Lab Results  Component Value Date   CKTOTAL 465 (H) 01/15/2022   CKMB 1.5 02/20/2011   TROPONINI 0.07 (H) 03/05/2016   No results found for: "DDIMER" Does the Patient currently have chest pain? No    R Recommendations: See Admitting Provider Note  Report given to:   Additional Notes:  pt cooperative, large skin abrasion left shoulder that has been  bandaged. Also over left eye has a wound that pt would not let me clean up he said I was too rough. Brain bleed

## 2023-07-30 NOTE — H&P (Cosign Needed Addendum)
Date: 07/30/2023               Patient Name:  Brandon [name] MRN: [id number]  DOB: Jan 31, 1966 Age / Sex: 57 y.o., male   PCP: Armando Gang, FNP              Medical Service: Internal Medicine Teaching Service              Attending Physician: Dr. Earl Lagos, MD    First Contact: Scherrie November, MS 4 Pager: [phone]  Second Contact: Dr. Morene Crocker Pager: [phone]            After Hours (After 5p/  First Contact Pager: [phone]  weekends / holidays): Second Contact Pager: [phone]   Chief Complaint: Fall  History of Present Illness: Patient is a 57 yo male with PMH of CAD, HTN, DM type 2, HLD, MDD, and hx of CVA who presented from [facility] after a fall. He has been at this SNF since 2023 after gradual deterioration and recurrent falls at home.  Patient is on ASA and Plavix. Patient has been using a 4 wheel walker since his prior stroke due to trouble with coordination. He states that he was trying to stand up and turn around when he stumbled and fell. Patient denies syncope, LOC, or palpitations around time of fall. He has a history of recurrent falls and last fall was a couple of months ago which did not require hospitalization. Endorses left knee pain. Denies any headache, changes in vision, nausea, vomiting, or new focal deficits.  History was limited due to patient dislike of multiple medical professionals asking questions but we spoke with his sister who provided more history about his living situation. CT Head in the ED shows parenchymal and subarachnoid hemorrhage without significant mass effect or midline shift. No acute fractures noted. Neurosurgery was consulted in the ED and recommended repeat CT Head at 22:00,and BID Keppra for 7 days. Patient repeatedly asked to leave to smoke but we discussed the risk of recurrent falls and need to monitor his symptoms. We discussed the plan to repeat imaging and keep him for observation and patient is agreeable to  the plan.  [facility] paper work reviews; Sports administrator in [employer] tab.  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 HLD Cirrhosis Repeated Falls  Meds:  Buspirone 10 mg Clopidogrel - 75 mg daily Creon 36000-114000 BID  Famotidine 20 Iron 325 mg PO daily Folic acid supplementation 1 mg daily Glucerna shake  Lantus 27 units at bedtime Humalog SSI Lidocaine patch L shoulder daily Metformin 1000 mg BID  Metoprolol tartrate 100 mg daily blood pressure Rosuvastatin 5mg  tablet at bedtime Tamsulosin 0.4 mg in PM Trazodone 150 mg at bedtime Trulicity 1.5 mg every Tuesday  Allergies: Allergies as of 07/30/2023 - Review Complete 03/25/2022  Allergen Reaction Noted   No known allergies  04/06/2015    Social History:  Patient lives at [facility] since 2023 and states that his sister comes and visits. His sister states that he requires 24/7 care. He currently smokes and has a 44 pack year history. He denies any current alcohol use but previous alcohol dependence and liver cirrhosis noted in chart. He denies any other drug use.   Review of Systems: A complete ROS was negative except as per HPI.   Physical Exam: Blood pressure (!) 142/106, pulse 77, temperature 98.8 F (37.1 C),  resp. rate (!) 21, SpO2 100%. Physical Exam HENT:     Head: Abrasion and laceration present.     Comments: Left abrasion near the temporal region with small hematoma.  Dried blood in the scalp with no active bleeding. Eyes:     Extraocular Movements: Extraocular movements intact.     Pupils: Pupils are equal, round, and reactive to light.  Cardiovascular:     Rate and Rhythm: Normal rate and regular rhythm.  Pulmonary:     Effort: Pulmonary effort is normal.     Breath sounds: Normal breath sounds.  Musculoskeletal:     Right lower leg: No edema.     Left lower leg: No edema.     Comments: Abrasion in  the left shoulder.  Abrasion on the bilateral dorsal hands.  Skin:    General: Skin is warm.  Neurological:     Mental Status: He is alert.  Psychiatric:        Behavior: Behavior is agitated.   L shoulder   L temple   Pertinent Labs: CMP     Component Value Date/Time   NA 139 07/30/2023 1424   K 4.4 07/30/2023 1424   CL 102 07/30/2023 1424   CO2 25 07/30/2023 1424   GLUCOSE 161 (H) 07/30/2023 1424   BUN 11 07/30/2023 1424   CREATININE 0.77 07/30/2023 1424   CREATININE 0.62 (L) 03/15/2020 1032   CALCIUM 9.4 07/30/2023 1424   PROT 6.6 07/30/2023 1424   ALBUMIN 3.4 (L) 07/30/2023 1424   AST 15 07/30/2023 1424   ALT 16 07/30/2023 1424   ALKPHOS 252 (H) 07/30/2023 1424   BILITOT 0.6 07/30/2023 1424   GFRNONAA >60 07/30/2023 1424   GFRNONAA >89 02/12/2012 1419    CBC    Component Value Date/Time   WBC 7.7 07/30/2023 1424   RBC 3.57 (L) 07/30/2023 1424   HGB 11.2 (L) 07/30/2023 1424   HCT 34.8 (L) 07/30/2023 1424   PLT 300 07/30/2023 1424   MCV 97.5 07/30/2023 1424   MCH 31.4 07/30/2023 1424   MCHC 32.2 07/30/2023 1424   RDW 14.9 07/30/2023 1424   LYMPHSABS 1.8 07/30/2023 1424   MONOABS 0.4 07/30/2023 1424   EOSABS 0.1 07/30/2023 1424   BASOSABS 0.1 07/30/2023 1424    Pertinent Imaging:  DG Shoulder Left Port  Result Date: 07/30/2023 IMPRESSION: 1. Small ossific density adjacent to the greater tuberosity of the left humerus, which could reflect a small acute fracture versus sequela of calcific rotator cuff tendinopathy. Please correlate with physical exam findings. 2. Mild superficial soft tissue swelling overlying the left shoulder. 3. Mild acromioclavicular and glenohumeral joint osteoarthritis. Electronically Signed   By: Sharlet Salina M.D.   On: 07/30/2023 17:09   DG Pelvis Portable  Result Date: 07/30/2023 . IMPRESSION: 1. Degenerative changes of the lumbar spine and bilateral hips. 2. No acute fracture. Electronically Signed   By: Sharlet Salina M.D.    On: 07/30/2023 17:07   DG CHEST PORT 1 VIEW  Result Date: 07/30/2023 IMPRESSION: 1. No acute intrathoracic process. Electronically Signed   By: Sharlet Salina M.D.   On: 07/30/2023 17:06   CT Head Wo Contrast  Result Date: 07/30/2023 IMPRESSION: 1. Multiple hyperdense foci in the medial frontal lobes bilaterally, most likely parenchymal and subarachnoid hemorrhage. No significant subdural or epidural collection. No significant mass effect or midline shift. 2. No acute fracture or traumatic listhesis in the cervical spine. These results were called by telephone at the time of interpretation on  07/30/2023 at 3:05 pm to provider MADISON [facility] , who verbally acknowledged these results. Electronically Signed   By: Wiliam Ke M.D.   On: 07/30/2023 15:05   CT Cervical Spine Wo Contrast  Result Date: 07/30/2023  IMPRESSION: 1. Multiple hyperdense foci in the medial frontal lobes bilaterally, most likely parenchymal and subarachnoid hemorrhage. No significant subdural or epidural collection. No significant mass effect or midline shift. 2. No acute fracture or traumatic listhesis in the cervical spine. These results were called by telephone at the time of interpretation on 07/30/2023 at 3:05 pm to provider MADISON [facility] , who verbally acknowledged these results. Electronically Signed   By: Wiliam Ke M.D.   On: 07/30/2023 15:05     Assessment & Plan by Problem: Principal Problem:   ICH (intracerebral hemorrhage) (HCC)  Acute Traumatic Intraparenchymal and Intracerebral Hemorrhage Multiple Abrasion Recurrent Falls Patient fell while trying to turn around while using 4 wheel walker. He denies any LOC, syncope, or palpitations at time of fall. He has a history of recurrent falls and has had trouble with coordination after his prior strokes. No focal deficits noted on exam. Residual weakness from stroke is noted on the right side of the body. Patient has multiple abrasion around left eyebrow, left  shoulder, and both hands. CT Head shows multiple parenchymal and subarachnoid hemorrhages. -NSGY consulted in ED and recommends holding his home Plavix with Keppra 500 mg BID and repeating CT Head at 20:00. Will follow-up repeat imaging and wait for formal recommendations. -Continue neuro checks -Check orthostatic vitals -Wound care consulted for multiple abrasions -Tylenol 650 mg Q6 PRN -Lidocaine patch 5%  Chronic Conditions: HTN HLD -Continue Lopressor 100 mg -Continue Crestor 5 mg  DM Patient said that he has not eaten much today.  -SSI -We will reassess insulin requirements tomorrow  BPH -Continue Flomax 0.4 mg daily  Anemia -Continue Ferrous sulfate 325 mg -Continue to monitor CBC -Continue Folic acid 1 mg   GERD -Continue Pepcid 20 mg  GAD -Continue Buspirone 10 mg BID  Hx of Stroke -Holding home Plavix until NSGY clearance -Holding home ASA 81 until NSGY clearance  Diet: Diabetic VTE Prophylaxis: SCDs Code: Full Dispo: Admit patient to Observation with expected length of stay less than 2 midnights.  Signed: Scherrie November, MS4 Scherrie November, Medical Student 07/30/2023, 5:22 PM  Pager: [phone]  Attestation for Student Documentation:  I personally was present and re-performed the history, physical exam and medical decision-making activities of this service and have verified that the service and findings are accurately documented in the student's note.  Morene Crocker, MD 07/30/2023, 7:00 PM

## 2023-07-30 NOTE — ED Notes (Signed)
Brandon [name] [facility] from [facility] for fall on Plavix, Hit head on concrete, LOC, Neck pain. Laceration to left eye bow, Hematoma to head. C-collar. Takes Plavix. Left hand abrasion. Baseline confusion. 140/90, 90, 98%ra, CBG

## 2023-07-30 NOTE — ED Notes (Signed)
Patient transported to CT transported by primary and Trauma nurse

## 2023-07-30 NOTE — Progress Notes (Signed)
Orthopedic Tech Progress Note Patient Details:  Brandon [name] 30-Jan-1966 [id number] Level 2 Trauma  Patient ID: Brandon [name], male   DOB: 1965-12-04, 57 y.o.   MRN: [id number]  Brandon [name] 07/30/2023, 2:35 PM

## 2023-07-30 NOTE — ED Notes (Signed)
Trauma surgery arrived

## 2023-07-30 NOTE — ED Notes (Signed)
C-collar removed by MD

## 2023-07-30 NOTE — ED Notes (Signed)
X-ray present

## 2023-07-31 ENCOUNTER — Encounter (HOSPITAL_COMMUNITY): Payer: Self-pay | Admitting: Internal Medicine

## 2023-07-31 ENCOUNTER — Observation Stay (HOSPITAL_COMMUNITY): Payer: Medicaid Other

## 2023-07-31 DIAGNOSIS — W19XXXA Unspecified fall, initial encounter: Secondary | ICD-10-CM | POA: Diagnosis not present

## 2023-07-31 DIAGNOSIS — S06360A Traumatic hemorrhage of cerebrum, unspecified, without loss of consciousness, initial encounter: Secondary | ICD-10-CM

## 2023-07-31 LAB — COMPREHENSIVE METABOLIC PANEL
ALT: 17 U/L (ref 0–44)
AST: 15 U/L (ref 15–41)
Albumin: 2.9 g/dL — ABNORMAL LOW (ref 3.5–5.0)
Alkaline Phosphatase: 222 U/L — ABNORMAL HIGH (ref 38–126)
Anion gap: 11 (ref 5–15)
BUN: 9 mg/dL (ref 6–20)
CO2: 23 mmol/L (ref 22–32)
Calcium: 9.1 mg/dL (ref 8.9–10.3)
Chloride: 102 mmol/L (ref 98–111)
Creatinine, Ser: 0.6 mg/dL — ABNORMAL LOW (ref 0.61–1.24)
GFR, Estimated: >60 mL/min (ref >60)
Glucose, Bld: 161 mg/dL — ABNORMAL HIGH (ref 70–99)
Potassium: 3.7 mmol/L (ref 3.5–5.1)
Sodium: 136 mmol/L (ref 135–145)
Total Bilirubin: 0.3 mg/dL (ref <1.2)
Total Protein: 6.1 g/dL — ABNORMAL LOW (ref 6.5–8.1)

## 2023-07-31 LAB — HEMOGLOBIN A1C
Hgb A1c MFr Bld: 8.6 % — ABNORMAL HIGH (ref 4.8–5.6)
Mean Plasma Glucose: 200.12 mg/dL

## 2023-07-31 LAB — CBC
HCT: 32.7 % — ABNORMAL LOW (ref 39.0–52.0)
Hemoglobin: 10.7 g/dL — ABNORMAL LOW (ref 13.0–17.0)
MCH: 31.1 pg (ref 26.0–34.0)
MCHC: 32.7 g/dL (ref 30.0–36.0)
MCV: 95.1 fL (ref 80.0–100.0)
Platelets: 273 10*3/uL (ref 150–400)
RBC: 3.44 MIL/uL — ABNORMAL LOW (ref 4.22–5.81)
RDW: 14.8 % (ref 11.5–15.5)
WBC: 8.8 10*3/uL (ref 4.0–10.5)
nRBC: 0 % (ref 0.0–0.2)

## 2023-07-31 LAB — GLUCOSE, CAPILLARY
Glucose-Capillary: 135 mg/dL — ABNORMAL HIGH (ref 70–99)
Glucose-Capillary: 159 mg/dL — ABNORMAL HIGH (ref 70–99)
Glucose-Capillary: 242 mg/dL — ABNORMAL HIGH (ref 70–99)
Glucose-Capillary: 193 mg/dL — ABNORMAL HIGH (ref 70–99)
Glucose-Capillary: 177 mg/dL — ABNORMAL HIGH (ref 70–99)

## 2023-07-31 LAB — HIV ANTIBODY (ROUTINE TESTING W REFLEX): HIV Screen 4th Generation wRfx: NONREACTIVE

## 2023-07-31 MED ORDER — ENSURE ENLIVE PO LIQD
237.0000 mL | Freq: Three times a day (TID) | ORAL | Status: DC
  Administered 2023-07-31 – 2023-08-04 (×11): 237 mL via ORAL
  Filled 2023-07-31: qty 237

## 2023-07-31 MED ORDER — MUPIROCIN 2 % EX OINT
TOPICAL_OINTMENT | Freq: Two times a day (BID) | CUTANEOUS | Status: DC
  Administered 2023-08-01 – 2023-08-03 (×4): 1 via TOPICAL
  Filled 2023-07-31 (×3): qty 22

## 2023-07-31 MED ORDER — ENSURE ENLIVE PO LIQD: 237.0000 mL | Freq: Two times a day (BID) | ORAL | Status: DC

## 2023-07-31 MED ORDER — B COMPLEX-C PO TABS: 1.0000 | ORAL_TABLET | Freq: Every day | ORAL | Status: DC

## 2023-07-31 MED ORDER — GERHARDT'S BUTT CREAM
TOPICAL_CREAM | Freq: Two times a day (BID) | CUTANEOUS | Status: DC
  Administered 2023-08-01 – 2023-08-03 (×4): 1 via TOPICAL
  Filled 2023-07-31: qty 1

## 2023-07-31 MED ORDER — BUSPIRONE HCL 10 MG PO TABS
10.0000 mg | ORAL_TABLET | Freq: Two times a day (BID) | ORAL | Status: DC
  Administered 2023-07-31 – 2023-08-04 (×9): 10 mg via ORAL
  Filled 2023-07-31 (×9): qty 1

## 2023-07-31 MED ORDER — IOHEXOL 350 MG/ML SOLN
75.0000 mL | Freq: Once | INTRAVENOUS | Status: AC | PRN
  Administered 2023-07-31: 75 mL via INTRAVENOUS

## 2023-07-31 MED ORDER — PANCRELIPASE (LIP-PROT-AMYL) 36000-114000 UNITS PO CPEP
36000.0000 [IU] | ORAL_CAPSULE | Freq: Three times a day (TID) | ORAL | Status: DC
  Administered 2023-07-31 – 2023-08-04 (×11): 36000 [IU] via ORAL
  Filled 2023-07-31 (×13): qty 1

## 2023-07-31 MED ORDER — OXYCODONE HCL 5 MG PO TABS
5.0000 mg | ORAL_TABLET | Freq: Four times a day (QID) | ORAL | Status: DC | PRN
  Administered 2023-07-31 – 2023-08-03 (×6): 5 mg via ORAL
  Filled 2023-07-31 (×6): qty 1

## 2023-07-31 MED ORDER — ADULT MULTIVITAMIN W/MINERALS CH
1.0000 | ORAL_TABLET | Freq: Every day | ORAL | Status: DC
  Administered 2023-07-31 – 2023-08-04 (×5): 1 via ORAL
  Filled 2023-07-31 (×5): qty 1

## 2023-07-31 NOTE — Progress Notes (Signed)
Progress Note     Subjective: Pt alert and denies pain this AM.   Objective: Vital signs in last 24 hours: Temp:  [98 F (36.7 C)-98.8 F (37.1 C)] 98.2 F (36.8 C) (11/07 0741) Pulse Rate:  [71-89] 75 (11/07 0741) Resp:  [12-25] 18 (11/07 0741) BP: (82-159)/(56-126) 93/62 (11/07 0741) SpO2:  [98 %-100 %] 99 % (11/07 0741) Weight:  [72.7 kg-90.7 kg] 72.7 kg (11/07 0500) Last BM Date : 07/31/23  Intake/Output from previous day: 11/06 0701 - 11/07 0700 In: 60 [P.O.:60] Out: -  Intake/Output this shift: No intake/output data recorded.  PE: General: pleasant, WD, chronically ill appearing male who is laying in bed in NAD HEENT: laceration to left brow without bleeding, PEERL, EOMI Heart: regular, rate, and rhythm.  Lungs: Respiratory effort nonlabored Abd: soft, NT, ND MS: moving all 4 extremities, dressing to left shoulder clean  Neuro: left sided weakness Psych: A&Ox3 with an appropriate affect.    Lab Results:  Recent Labs    07/30/23 1424 07/31/23 0632  WBC 7.7 8.8  HGB 11.2* 10.7*  HCT 34.8* 32.7*  PLT 300 273   BMET Recent Labs    07/30/23 1424 07/31/23 0632  NA 139 136  K 4.4 3.7  CL 102 102  CO2 25 23  GLUCOSE 161* 161*  BUN 11 9  CREATININE 0.77 0.60*  CALCIUM 9.4 9.1   PT/INR No results for input(s): "LABPROT", "INR" in the last 72 hours. CMP     Component Value Date/Time   NA 136 07/31/2023 0632   K 3.7 07/31/2023 0632   CL 102 07/31/2023 0632   CO2 23 07/31/2023 0632   GLUCOSE 161 (H) 07/31/2023 0632   BUN 9 07/31/2023 0632   CREATININE 0.60 (L) 07/31/2023 0632   CREATININE 0.62 (L) 03/15/2020 1032   CALCIUM 9.1 07/31/2023 0632   PROT 6.1 (L) 07/31/2023 0632   ALBUMIN 2.9 (L) 07/31/2023 0632   AST 15 07/31/2023 0632   ALT 17 07/31/2023 0632   ALKPHOS 222 (H) 07/31/2023 0632   BILITOT 0.3 07/31/2023 0632   GFRNONAA >60 07/31/2023 0632   GFRNONAA >89 02/12/2012 1419   GFRAA >60 02/19/2020 0500   GFRAA >89 02/12/2012 1419    Lipase     Component Value Date/Time   LIPASE 113 (H) 02/17/2020 0800       Studies/Results: CT Head Wo Contrast  Result Date: 07/30/2023 CLINICAL DATA:  Head trauma.  Follow-up intracranial bleed. EXAM: CT HEAD WITHOUT CONTRAST TECHNIQUE: Contiguous axial images were obtained from the base of the skull through the vertex without intravenous contrast. RADIATION DOSE REDUCTION: This exam was performed according to the departmental dose-optimization program which includes automated exposure control, adjustment of the mA and/or kV according to patient size and/or use of iterative reconstruction technique. COMPARISON:  Earlier CT dated 07/30/2023. FINDINGS: Brain: Slight interval increase in the size of intraparenchymal bleed/hemorrhagic contusion of the medial left frontal lobe (15/2) measuring 13 mm in diameter (previously approximately 7 mm). Additional hemorrhagic foci in the frontal lobes bilaterally are relatively similar to prior CT. There is moderate age-related atrophy and chronic microvascular ischemic changes. Old right frontal infarct and encephalomalacia. No mass effect or midline shift. No extra-axial fluid collection. Vascular: No hyperdense vessel or unexpected calcification. Skull: Normal. Negative for fracture or focal lesion. Sinuses/Orbits: No acute finding. Other: None IMPRESSION: 1. Slight interval increase in the size of intraparenchymal bleed/hemorrhagic contusion of the medial left frontal lobe. 2. Additional hemorrhagic foci in the frontal lobes  bilaterally are relatively similar to prior CT. 3. Moderate age-related atrophy and chronic microvascular ischemic changes. Old right frontal infarct and encephalomalacia. Electronically Signed   By: Elgie Collard M.D.   On: 07/30/2023 22:47   DG Knee 1-2 Views Left  Result Date: 07/30/2023 CLINICAL DATA:  Fall.  Left knee pain. EXAM: LEFT KNEE - 1-2 VIEW COMPARISON:  None Available. FINDINGS: No acute fracture or dislocation. No  aggressive osseous lesion. No significant arthritis of the knee joint. No knee effusion or focal soft tissue swelling. No radiopaque foreign bodies. IMPRESSION: No acute osseous abnormality Electronically Signed   By: Jules Schick M.D.   On: 07/30/2023 19:04   DG Shoulder Left Port  Result Date: 07/30/2023 CLINICAL DATA:  Larey Seat out of wheelchair, left shoulder injury and abrasion EXAM: LEFT SHOULDER COMPARISON:  01/14/2022 FINDINGS: Internal rotation, external rotation, transscapular views of the left shoulder are obtained. There is a small curvilinear ossific density adjacent to the greater tuberosity of the left humerus, best seen on the internal and external rotation views. This could reflect sequela from calcific tendinopathy or small avulsion fracture given history of recent trauma. Correlation with physical exam recommended. There is mild superficial soft tissue swelling overlying the humeral head and acromion process. Mild degenerative changes of the acromioclavicular and glenohumeral joints. Left chest is clear. IMPRESSION: 1. Small ossific density adjacent to the greater tuberosity of the left humerus, which could reflect a small acute fracture versus sequela of calcific rotator cuff tendinopathy. Please correlate with physical exam findings. 2. Mild superficial soft tissue swelling overlying the left shoulder. 3. Mild acromioclavicular and glenohumeral joint osteoarthritis. Electronically Signed   By: Sharlet Salina M.D.   On: 07/30/2023 17:09   DG Pelvis Portable  Result Date: 07/30/2023 CLINICAL DATA:  Larey Seat out of wheelchair EXAM: PORTABLE PELVIS 1-2 VIEWS COMPARISON:  06/16/2012 FINDINGS: Single frontal view of the pelvis includes both hips. No acute fracture, subluxation, or dislocation. Mild bilateral hip osteoarthritis. Prominent spondylosis and facet hypertrophy at the lumbosacral junction. Sacroiliac joints are unremarkable. IMPRESSION: 1. Degenerative changes of the lumbar spine and  bilateral hips. 2. No acute fracture. Electronically Signed   By: Sharlet Salina M.D.   On: 07/30/2023 17:07   DG CHEST PORT 1 VIEW  Result Date: 07/30/2023 CLINICAL DATA:  Larey Seat out of wheelchair, hit head EXAM: PORTABLE CHEST 1 VIEW COMPARISON:  01/14/2022 FINDINGS: Single frontal view of the chest demonstrates an unremarkable cardiac silhouette. No acute airspace disease, effusion, or pneumothorax. There are no acute displaced fractures. IMPRESSION: 1. No acute intrathoracic process. Electronically Signed   By: Sharlet Salina M.D.   On: 07/30/2023 17:06   CT Head Wo Contrast  Result Date: 07/30/2023 CLINICAL DATA:  Fall on Plavix EXAM: CT HEAD WITHOUT CONTRAST CT CERVICAL SPINE WITHOUT CONTRAST TECHNIQUE: Multidetector CT imaging of the head and cervical spine was performed following the standard protocol without intravenous contrast. Multiplanar CT image reconstructions of the cervical spine were also generated. RADIATION DOSE REDUCTION: This exam was performed according to the departmental dose-optimization program which includes automated exposure control, adjustment of the mA and/or kV according to patient size and/or use of iterative reconstruction technique. COMPARISON:  01/07/2022 CT head and cervical spine FINDINGS: CT HEAD FINDINGS Brain: Multiple hyperdense foci in the medial frontal lobes bilaterally (series 3, images 15-20 and 25-27), most likely parenchymal and subarachnoid hemorrhage. No significant subdural or epidural collection. Redemonstrated global parenchymal volume loss with prominence the ventricles and extra-axial spaces, which appear advanced for age  but unchanged from the prior exam. Encephalomalacia and gliosis in right frontal lobe is again noted. Additional remote infarct in the right caudate head. No evidence of acute infarct, mass, mass effect, or midline shift. No hydrocephalus. Vascular: No hyperdense vessel. Skull: Negative for fracture or focal lesion. Sinuses/Orbits: No  acute finding. Other: The mastoid air cells are well aerated. CT CERVICAL SPINE FINDINGS Alignment: No traumatic listhesis. Trace retrolisthesis of C3 on C4, C4 on C5, and C5 on C6, which appears facet mediated. Levocurvature is likely positional. Skull base and vertebrae: No acute fracture or suspicious osseous lesion. Unchanged compression deformity of T1. Soft tissues and spinal canal: No prevertebral fluid or swelling. No visible canal hematoma. Disc levels: Degenerative changes in the cervical spine.Moderate to severe spinal canal stenosis at C3-C4 and C4-C5. Upper chest: No focal pulmonary opacity or pleural effusion. IMPRESSION: 1. Multiple hyperdense foci in the medial frontal lobes bilaterally, most likely parenchymal and subarachnoid hemorrhage. No significant subdural or epidural collection. No significant mass effect or midline shift. 2. No acute fracture or traumatic listhesis in the cervical spine. These results were called by telephone at the time of interpretation on 07/30/2023 at 3:05 pm to provider MADISON [facility] , who verbally acknowledged these results. Electronically Signed   By: Wiliam Ke M.D.   On: 07/30/2023 15:05   CT Cervical Spine Wo Contrast  Result Date: 07/30/2023 CLINICAL DATA:  Fall on Plavix EXAM: CT HEAD WITHOUT CONTRAST CT CERVICAL SPINE WITHOUT CONTRAST TECHNIQUE: Multidetector CT imaging of the head and cervical spine was performed following the standard protocol without intravenous contrast. Multiplanar CT image reconstructions of the cervical spine were also generated. RADIATION DOSE REDUCTION: This exam was performed according to the departmental dose-optimization program which includes automated exposure control, adjustment of the mA and/or kV according to patient size and/or use of iterative reconstruction technique. COMPARISON:  01/07/2022 CT head and cervical spine FINDINGS: CT HEAD FINDINGS Brain: Multiple hyperdense foci in the medial frontal lobes bilaterally  (series 3, images 15-20 and 25-27), most likely parenchymal and subarachnoid hemorrhage. No significant subdural or epidural collection. Redemonstrated global parenchymal volume loss with prominence the ventricles and extra-axial spaces, which appear advanced for age but unchanged from the prior exam. Encephalomalacia and gliosis in right frontal lobe is again noted. Additional remote infarct in the right caudate head. No evidence of acute infarct, mass, mass effect, or midline shift. No hydrocephalus. Vascular: No hyperdense vessel. Skull: Negative for fracture or focal lesion. Sinuses/Orbits: No acute finding. Other: The mastoid air cells are well aerated. CT CERVICAL SPINE FINDINGS Alignment: No traumatic listhesis. Trace retrolisthesis of C3 on C4, C4 on C5, and C5 on C6, which appears facet mediated. Levocurvature is likely positional. Skull base and vertebrae: No acute fracture or suspicious osseous lesion. Unchanged compression deformity of T1. Soft tissues and spinal canal: No prevertebral fluid or swelling. No visible canal hematoma. Disc levels: Degenerative changes in the cervical spine.Moderate to severe spinal canal stenosis at C3-C4 and C4-C5. Upper chest: No focal pulmonary opacity or pleural effusion. IMPRESSION: 1. Multiple hyperdense foci in the medial frontal lobes bilaterally, most likely parenchymal and subarachnoid hemorrhage. No significant subdural or epidural collection. No significant mass effect or midline shift. 2. No acute fracture or traumatic listhesis in the cervical spine. These results were called by telephone at the time of interpretation on 07/30/2023 at 3:05 pm to provider MADISON [facility] , who verbally acknowledged these results. Electronically Signed   By: Wiliam Ke M.D.   On:  07/30/2023 15:05    Anti-infectives: Anti-infectives (From admission, onward)    None        Assessment/Plan  56 y/o M presenting s/p unwitnessed fall from a wheelchair without  LOC Hypotension - resolved s/p 1 L IVF, hypertensive overnight with some softer pressures this AM, workup per medical team   Eyebrow laceration - closure per EDP Skin abrasions - local care SAH - NS consulted, follow up CTH with expected mild evolution, continue to hold plavix until NS clears to resume, keppra x7 days, TBI therapies  ?small acute fracture of left humerus vs calcific rotator cuff tendinopathy - recommend ortho eval to determine if any further imaging or formal consult needed   No other recommendations from a trauma standpoint, we will sign off. Please call if we can be of further assistance.    FEN: CM diet  VTE: anticoagulation on hold in setting of TBI ID: no current abx   CVA w/ residual left-sided weakness  CAD HLD T2DM    LOS: 0 days   I reviewed Consultant NS notes, hospitalist notes, last 24 h vitals and pain scores, last 48 h intake and output, last 24 h labs and trends, and last 24 h imaging results.   Juliet Rude, [facility] 07/31/2023, 8:50 AM Please see Amion for pager number during day hours 7:00am-4:30pm
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
## 2023-07-31 NOTE — Consult Note (Signed)
WOC Nurse Consult Note: this consult performed remotely following review of EMR including photo documentation  Reason for Consult: 1. abrasion  L temple, L shoulder, hands  2.  Redness to penis/scrotum  Wound type: 1.  Full thickness skin loss L temple, L shoulder and B dorsal hands r/t trauma post fall  2.  Moisture Associated Skin Damage to penis/scrotum  ICD-10 CM Codes for Irritant Dermatitis L24A2 - Due to fecal, urinary or dual incontinence Pressure Injury POA: NA  Measurement: see nursing flowsheet Wound bed: L temple, L shoulder, dorsal hands red and moist Drainage (amount, consistency, odor) serosanguinous  Periwound: intact  Dressing procedure/placement/frequency:  Clean abrasions to L shoulder and B hands with NS, apply Vaseline gauze [place] #[phone]) cut to fit wound beds daily, cover with Telfa nonstick dressing and silicone foam to secure.  Clean abrasions to L temple/face/ear with NS, apply Mupirocin 2 times daily. May cover with Telfa nonstick pad and tape or may leave open to air if unable to get any dressing to stay on.   Clean penis/scrotum/inner thighs with soap and water, dry and apply Gerhardt's Butt Cream to area 2 times a day and prn soiling. May sprinkle over Gerhardt's to scrotum and thighs with floor stock antifungal powder (green label Microguard) for extra drying effect.    POC discussed with bedside nurse. WOC team will not follow. Re-consult if further needs arise.   Thank you,    Priscella Mann MSN, RN-BC, [employer] [phone]

## 2023-07-31 NOTE — Plan of Care (Signed)
  Problem: Metabolic: Goal: Ability to maintain appropriate glucose levels will improve Outcome: Progressing   Problem: Tissue Perfusion: Goal: Adequacy of tissue perfusion will improve Outcome: Progressing   Problem: Clinical Measurements: Goal: Will remain free from infection Outcome: Progressing Goal: Respiratory complications will improve Outcome: Progressing Goal: Cardiovascular complication will be avoided Outcome: Progressing   Problem: Nutrition: Goal: Adequate nutrition will be maintained Outcome: Progressing   Problem: Elimination: Goal: Will not experience complications related to bowel motility Outcome: Progressing Goal: Will not experience complications related to urinary retention Outcome: Progressing   Problem: Pain Management: Goal: General experience of comfort will improve Outcome: Progressing   Problem: Safety: Goal: Ability to remain free from injury will improve Outcome: Progressing
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
## 2023-07-31 NOTE — Consult Note (Signed)
CC: SAH  HPI:     Patient is a 57 y.o. male w/ PMH of R CVA maintained on Plavix w/ residual L hemiparesis presented to ED from SNF yesterday after a fall. Initial CTH showed mixed frontal SAH/contusions.    Patient Active Problem List   Diagnosis Date Noted   ICH (intracerebral hemorrhage) (HCC) 07/30/2023   Pain in left shoulder 03/25/2022   Pressure injury of skin 01/08/2022   Frequent falls 01/08/2022   DKA (diabetic ketoacidosis) (HCC) 01/07/2022   Rhabdomyolysis 01/07/2022   Protein-calorie malnutrition, severe 12/23/2020   Hyperosmolar hyperglycemic state (HHS) (HCC) 12/22/2020   Hyperglycemia due to type 2 diabetes mellitus (HCC) 12/21/2020   Hyperglycemia due to diabetes mellitus (HCC) 12/21/2020   Empyema lung (HCC)    Epidural abscess    Diskitis 02/06/2020   Alcohol withdrawal delirium (HCC) 02/06/2020   Epilepsy (HCC) 02/06/2020   Pleural effusion on right 02/06/2020   History of stroke 01/23/2017   Dry eyes    Sleep disturbance    Essential hypertension, benign    Upper GI bleed    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   Fatty liver    Tobacco abuse    Diabetes mellitus type 2 in nonobese (HCC)    History of CVA with residual deficit    Acute lower UTI    Tachycardia    Chronic alcoholic pancreatitis (HCC)    Hyponatremia 03/09/2016   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   Acute blood loss anemia 03/09/2016   Gastric varices    Alcohol abuse    Left-sided neglect    Severe anemia    UGIB (upper gastrointestinal bleed)    Pressure ulcer 03/06/2016   Acute encephalopathy    Cerebral infarction (HCC) 03/05/2016   Carotid stenosis 04/06/2015   CAD in native artery 03/16/2015   Unstable angina pectoris (HCC) 03/15/2015   Neck pain 10/20/2013   Insomnia 12/29/2012   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 02/25/2011   Hepatic steatosis 02/25/2011   Past Medical History:  Diagnosis Date   Alcohol dependency (HCC)    Hx ETOH withdrawal seizures before 2011   CAD (coronary artery disease) 06/13/2012   Calcification noted on CTA of chest in 2012 Wall motion abnormality on ECHO     Carotid artery disease (HCC) 2016   bilateral.  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    Depression with anxiety    Diabetes mellitus without complication (HCC)    GERD (gastroesophageal reflux disease)    Headache(784.0)    migraine   Heart disease 02/2015   PCI/DES placed to RCA: on chronic Plavix/ASA   Hyperlipidemia    Hypertension    Pancreatitis 2011....    CT findings in May 2011 with inflammation and pseudocyst.  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/2016   Renal artery stenosis, native, bilateral (HCC) 02/2015    Past  Surgical History:  Procedure Laterality Date   CARDIAC CATHETERIZATION N/A 03/15/2015   Procedure: Left Heart Cath;  Surgeon: Laurier Nancy, MD;  Location: [facility] INVASIVE CV LAB;  Service: Cardiovascular;  Laterality: N/A;   CARDIAC CATHETERIZATION N/A 03/16/2015   Procedure: Coronary Stent Intervention;  Surgeon: Alwyn Pea, MD;  Location: [facility] INVASIVE CV LAB;  Service: Cardiovascular;  Laterality: N/A;   CAROTID ANGIOGRAM N/A 06/15/2012   Procedure: CAROTID ANGIOGRAM;  Surgeon: Chuck Hint, MD;  Location: [facility] CATH LAB;  Service: Cardiovascular;   Laterality: N/A;   ESOPHAGOGASTRODUODENOSCOPY N/A 03/08/2016   Procedure: ESOPHAGOGASTRODUODENOSCOPY (EGD);  Surgeon: Ruffin Frederick, MD;  Location: [facility] ENDOSCOPY;  Service: Gastroenterology;  Laterality: N/A;   none     PERIPHERAL VASCULAR CATHETERIZATION Left 04/06/2015   Procedure: Carotid PTA/Stent Intervention;  Surgeon: Annice Needy, MD;  Location: [facility] INVASIVE CV LAB;  Service: Cardiovascular;  Laterality: Left;    Medications Prior to Admission  Medication Sig Dispense Refill Last Dose   ascorbic acid (VITAMIN C) 500 MG tablet Take 500 mg by mouth daily.   07/30/2023 at am   ASPIRIN LOW DOSE 81 MG EC tablet Take 1 tablet (81 mg total) by mouth daily. 30 tablet 0 07/30/2023 at am   busPIRone (BUSPAR) 10 MG tablet Take 1 tablet (10 mg total) by mouth 2 (two) times daily. 60 tablet 0 07/30/2023 at am   clopidogrel (PLAVIX) 75 MG tablet Take 1 tablet (75 mg total) by mouth daily. 30 tablet 0 07/30/2023 at am   famotidine (PEPCID) 20 MG tablet Take 1 tablet (20 mg total) by mouth 2 (two) times daily. 60 tablet 0 07/29/2023 at pm   feeding supplement, GLUCERNA SHAKE, (GLUCERNA SHAKE) LIQD Take 237 mLs by mouth 2 (two) times daily between meals.  0 07/30/2023 at am   FEROSUL 325 (65 Fe) MG tablet Take 1 tablet (325 mg total) by mouth daily. 30 tablet 0 07/30/2023 at am   folic acid (FOLVITE) 1 MG tablet Take 1 tablet (1 mg total) by mouth daily. 30 tablet 0 07/30/2023 at am   HM LIDOCAINE PATCH EX Apply 1 patch topically daily. Apply to left shoulder for pain - 5% patch   07/28/2023 at am   insulin glargine (LANTUS) 100 UNIT/ML Solostar Pen Inject 27 Units into the skin at bedtime.   07/29/2023 at pm   insulin lispro (HUMALOG) 100 UNIT/ML injection Inject 2-10 Units into the skin 3 (three) times daily before meals. Per sliding scale: 70-130= 0 units, 131-180= 2 units, 181-240= 4 units, 241-300= 6 units, 301-350= 8 units, 351-400= 10 units   07/30/2023 at am - 2 units   lipase/protease/amylase (CREON)  36000 UNITS CPEP capsule Take 36,000 Units by mouth in the morning and at bedtime.   07/30/2023 at am   metFORMIN (GLUCOPHAGE) 1000 MG tablet Take 1 tablet (1,000 mg total) by mouth 2 (two) times daily with a meal. 60 tablet 0 07/30/2023 at am   metoprolol tartrate (LOPRESSOR) 100 MG tablet Take 100 mg by mouth daily.   07/30/2023 at 0800   Multiple Vitamin (MULTIVITAMIN WITH MINERALS) TABS tablet Take 1 tablet by mouth daily. 30 tablet 0 07/30/2023 at am   rosuvastatin (CRESTOR) 5 MG tablet Take 5 mg by mouth at bedtime.   07/29/2023 at pm   tamsulosin (FLOMAX) 0.4 MG CAPS capsule Take 0.4 mg by mouth daily in the afternoon.   07/29/2023 at pm   traZODone (DESYREL) 150 MG tablet Take 1 tablet (150 mg  total) by mouth at bedtime. 10 tablet 0 07/29/2023 at pm   No Known Allergies  Social History   Tobacco Use   Smoking status: Every Day    Current packs/day: 1.00    Average packs/day: 1 pack/day for 30.0 years (30.0 ttl pk-yrs)    Types: Cigarettes   Smokeless tobacco: Never   Tobacco comments:    smoking less  Substance Use Topics   Alcohol use: No    Alcohol/week: 6.0 standard drinks of alcohol    Types: 6 Cans of beer per week    Comment: drinks 6-12 beer daily    Family History  Problem Relation Age of Onset   Stroke Mother        deceased   Coronary artery disease Mother    Alcohol abuse Mother    Cancer Mother    Hypertension Father        alive   Alcohol abuse Father    Diabetes Father    Kidney disease Father    Stroke Maternal Grandmother     Objective:   Patient Vitals for the past 8 hrs:  BP Temp Temp src Pulse Resp SpO2 Weight  07/31/23 0741 93/62 98.2 F (36.8 C) Oral 75 18 99 % --  07/31/23 0500 109/77 98 F (36.7 C) Oral 71 -- 100 % --  07/31/23 0500 -- -- -- -- -- -- 72.7 kg  07/31/23 0123 (!) 125/99 98.1 F (36.7 C) Oral 73 18 100 % --   I/O last 3 completed shifts: In: 60 [P.O.:60] Out: -  No intake/output data recorded.  Drowsy but oriented to  person.  Speech fluent.  CN grossly intact MAEs. Chronic L sided hemiparesis  Echymosis around L eye PERRLA   Assessment:   This is a 57yo w/ a history of R sided CVA with acute traumatic ICH, mixed frontal SAH and intraparenchymal after a fall on Plavix. Repeat CTH showing expected mild evolution.    Plan:   -Continue supportive care -Keppra 500mg  BID x7d -Hold anticoagulation.  -SBP<150.  -Call w/ questions/concerns.   Deshana Rominger Margaree Mackintosh, PA-C

## 2023-07-31 NOTE — Evaluation (Signed)
Physical Therapy Evaluation Patient Details Name: Brandon [name] MRN: [id number] DOB: 05-09-1966 Today's Date: 07/31/2023  History of Present Illness  57 yo male who presented to ED 11/6  from [facility] after a fall while trying to stand up from his Rollator and turn around Found to have Acute Traumatic Intraparenchymal and Intracerebral Hemorrhage in addition to multiple abrasions. Questionable L humerus fracture, MRI pending. PMH of CAD, HTN, DM type 2, HLD, MDD, and hx of CVA PMH includes: alcohol abuse in remission, CAD, CVA, insulin dependent DM, HTN, malnutrition.  Clinical Impression  PTA pt resident of [facility] where he fell trying to turn in his Rollator. Pt has long history of falls. Pt utilizes a Rollator for mobilization in his facility and requires assist for ADLs and iADLs. Pt limited in safe mobility by L shoulder and side pain, in presence of decreased strength, balance and endurance as well as decreased safety awareness. PT recommend pt return to his SNF. PT will follow acutely to be progress his mobility.          Can travel by private vehicle   No    Equipment Recommendations None recommended by PT     Functional Status Assessment Patient has had a recent decline in their functional status and demonstrates the ability to make significant improvements in function in a reasonable and predictable amount of time.     Precautions / Restrictions Precautions Precautions: Fall Precaution Comments: admission result of fall, known to have multiple falls at his facility Restrictions Weight Bearing Restrictions: No      Mobility  Bed Mobility Overal bed mobility: Needs Assistance Bed Mobility: Supine to Sit     Supine to sit: Supervision     General bed mobility comments: able to come to side of bed with increased time and effort due to pain in shoulder and L side    Transfers Overall transfer level: Needs assistance Equipment used: 2 person hand  held assist Transfers: Bed to chair/wheelchair/BSC            Lateral/Scoot Transfers: +2 physical assistance, Mod assist General transfer comment: lateral scoot due to L hip and LUE           Balance Overall balance assessment: Needs assistance Sitting-balance support: Feet supported Sitting balance-Leahy Scale: Fair                                       Pertinent Vitals/Pain Pain Assessment Pain Assessment: Faces Faces Pain Scale: Hurts little more Pain Location: L shoulder and hip with movement Pain Descriptors / Indicators: Grimacing, Guarding, Moaning Pain Intervention(s): Limited activity within patient's tolerance, Monitored during session, Repositioned    Home Living Family/patient expects to be discharged to:: Skilled nursing facility     Type of Home: Other(Comment) (SNF [facility])             Additional Comments: Per chart, pt at SNF since 2023    Prior Function Prior Level of Function : Needs assist             Mobility Comments: questionable historian, ambulates with rollator. declines recent falls beyond this one leading to admit ADLs Comments: questionable historian, pt reports he bathes in the shower, ambulates to bathroom and completes own ADLs.     Extremity/Trunk Assessment   Upper Extremity Assessment Upper Extremity Assessment: Defer to OT evaluation LUE Deficits / Details:  questionable L humerus fx, pt reports pain. guarding extremity throughout session LUE Sensation: WNL LUE Coordination: decreased gross motor    Lower Extremity Assessment Lower Extremity Assessment: Difficult to assess due to impaired cognition (declines "therapy" today only agreeable to sliding to drop arm recliner on his R )    Cervical / Trunk Assessment Cervical / Trunk Assessment: Kyphotic  Communication   Communication Communication: No apparent difficulties Cueing Techniques: Verbal cues;Tactile cues;Visual cues  Cognition  Arousal: Alert Behavior During Therapy: Flat affect, Restless Overall Cognitive Status: Difficult to assess                                 General Comments: pt with minor irritation about being asked to get up from his urine soaked bed to move to recliner, but ultimately agreeable        General Comments General comments (skin integrity, edema, etc.): VSS on RA        Assessment/Plan    PT Assessment Patient needs continued PT services  PT Problem List Decreased strength;Decreased range of motion;Decreased activity tolerance;Decreased balance;Decreased mobility;Decreased cognition;Decreased safety awareness;Decreased skin integrity;Pain       PT Treatment Interventions DME instruction;Gait training;Functional mobility training;Therapeutic activities;Therapeutic exercise;Balance training;Cognitive remediation;Patient/family education    PT Goals (Current goals can be found in the Care Plan section)  Acute Rehab PT Goals Patient Stated Goal: none stated PT Goal Formulation: With patient Time For Goal Achievement: 08/14/23 Potential to Achieve Goals: Fair    Frequency Min 1X/week     Co-evaluation   Reason for Co-Treatment: Complexity of the patient's impairments (multi-system involvement);For patient/therapist safety;To address functional/ADL transfers   OT goals addressed during session: ADL's and self-care       AM-PAC PT "6 Clicks" Mobility  Outcome Measure Help needed turning from your back to your side while in a flat bed without using bedrails?: A Little Help needed moving from lying on your back to sitting on the side of a flat bed without using bedrails?: A Little Help needed moving to and from a bed to a chair (including a wheelchair)?: Total Help needed standing up from a chair using your arms (e.g., wheelchair or bedside chair)?: Total Help needed to walk in hospital room?: Total Help needed climbing 3-5 steps with a railing? : Total 6 Click  Score: 10    End of Session   Activity Tolerance: Patient limited by pain Patient left: in chair;with call bell/phone within reach;with chair alarm set Nurse Communication: Mobility status PT Visit Diagnosis: Unsteadiness on feet (R26.81);Repeated falls (R29.6);History of falling (Z91.81);Muscle weakness (generalized) (M62.81);Difficulty in walking, not elsewhere classified (R26.2);Pain Pain - Right/Left: Left Pain - part of body: Shoulder    Time: 9518-8416 PT Time Calculation (min) (ACUTE ONLY): 20 min   Charges:   PT Evaluation $PT Eval Moderate Complexity: 1 Mod   PT General Charges $$ ACUTE PT VISIT: 1 Visit         Oliva Montecalvo B. Beverely Risen PT, DPT Acute Rehabilitation Services Please use secure chat or  Call Office [phone]   Elon Alas [facility] 07/31/2023, 2:14 PM

## 2023-07-31 NOTE — ED Provider Notes (Signed)
[place] [facility] PROGRESSIVE CARE Provider Note  CSN: [id number] Arrival date & time: 07/30/23 1422  Chief Complaint(s) Trauma and Fall  HPI Brandon [name] is a 57 y.o. male with PMH alcohol use, CAD, T2DM, CVA with persistent right-sided hemiparesis wheelchair-bound who presents emergency department as a level 2 trauma for a fall on blood thinners.  Patient states that earlier today he got weak and dizzy and fell out of his wheelchair striking his head against the ground.  On arrival, patient hypotensive with systolics in the 80s but patient alert and quite disheveled appearing.  Endorses headache but denies neck pain, chest pain, shortness of breath, abdominal pain, nausea, vomiting or other systemic symptoms.   Past Medical History Past Medical History:  Diagnosis Date   Alcohol dependency (HCC)    Hx ETOH withdrawal seizures before 2011   CAD (coronary artery disease) 06/13/2012   Calcification noted on CTA of chest in 2012 Wall motion abnormality on ECHO     Carotid artery disease (HCC) 2016   bilateral.  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    Depression with anxiety    Diabetes mellitus without complication (HCC)    GERD (gastroesophageal reflux disease)    Headache(784.0)    migraine   Heart disease 02/2015   PCI/DES placed to RCA: on chronic Plavix/ASA   Hyperlipidemia    Hypertension    Pancreatitis 2011....    CT findings in May 2011 with inflammation and pseudocyst.  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/2016   Renal artery stenosis, native, bilateral (HCC) 02/2015   Patient Active Problem List   Diagnosis Date Noted   ICH (intracerebral hemorrhage) (HCC) 07/30/2023   Pain in left shoulder 03/25/2022   Pressure injury of skin 01/08/2022   Frequent falls 01/08/2022   DKA (diabetic ketoacidosis) (HCC) 01/07/2022   Rhabdomyolysis 01/07/2022   Protein-calorie malnutrition, severe  12/23/2020   Hyperosmolar hyperglycemic state (HHS) (HCC) 12/22/2020   Hyperglycemia due to type 2 diabetes mellitus (HCC) 12/21/2020   Hyperglycemia due to diabetes mellitus (HCC) 12/21/2020   Empyema lung (HCC)    Epidural abscess    Diskitis 02/06/2020   Alcohol withdrawal delirium (HCC) 02/06/2020   Epilepsy (HCC) 02/06/2020   Pleural effusion on right 02/06/2020   History of stroke 01/23/2017   Dry eyes    Sleep disturbance    Essential hypertension, benign    Upper GI bleed    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   Fatty liver    Tobacco abuse    Diabetes mellitus type 2 in nonobese (HCC)    History of CVA with residual deficit    Acute lower UTI    Tachycardia    Chronic alcoholic pancreatitis (HCC)    Hyponatremia 03/09/2016   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   Acute blood loss anemia 03/09/2016   Gastric varices    Alcohol abuse    Left-sided neglect    Severe anemia    UGIB (upper gastrointestinal bleed)    Pressure ulcer 03/06/2016   Acute encephalopathy    Cerebral infarction (HCC) 03/05/2016   Carotid stenosis 04/06/2015   CAD in native artery 03/16/2015   Unstable angina pectoris (HCC) 03/15/2015   Neck pain 10/20/2013   Insomnia 12/29/2012   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 02/25/2011   Hepatic steatosis 02/25/2011   Home Medication(s) Prior to Admission medications    Medication Sig Start Date End Date Taking? Authorizing Provider  ascorbic acid (VITAMIN C) 500 MG tablet Take 500 mg by mouth daily. 11/26/21  Yes [provider]  ASPIRIN LOW DOSE 81 MG EC tablet Take 1 tablet (81 mg total) by mouth daily. 12/23/20  Yes Gherghe, Daylene Katayama, MD  busPIRone (BUSPAR) 10 MG tablet Take 1 tablet (10 mg total) by mouth 2 (two) times daily. 12/23/20  Yes Leatha Gilding, MD  clopidogrel (PLAVIX) 75 MG tablet Take 1 tablet (75 mg total) by mouth daily. 12/23/20  Yes Leatha Gilding, MD  famotidine (PEPCID) 20 MG tablet Take 1 tablet (20 mg total) by mouth 2 (two) times daily. 12/23/20  Yes Gherghe, Daylene Katayama, MD  feeding supplement, GLUCERNA SHAKE, (GLUCERNA SHAKE) LIQD Take 237 mLs by mouth 2 (two) times daily between meals. 01/09/22  Yes Hollice Espy, MD  FEROSUL 325 (65 Fe) MG tablet Take 1 tablet (325 mg total) by mouth daily. 12/23/20  Yes Gherghe, Daylene Katayama, MD  folic acid (FOLVITE) 1 MG tablet Take 1 tablet (1 mg total) by mouth daily. 12/23/20  Yes Leatha Gilding, MD  HM LIDOCAINE PATCH EX Apply 1 patch topically daily. Apply to left shoulder for pain - 5% patch   Yes [provider]  insulin glargine (LANTUS) 100 UNIT/ML Solostar Pen Inject 27 Units into the skin at bedtime.   Yes [provider]  insulin lispro (HUMALOG) 100 UNIT/ML injection Inject 2-10 Units into the skin 3 (three) times daily before meals. Per sliding scale: 70-130= 0 units, 131-180= 2 units, 181-240= 4 units, 241-300= 6 units, 301-350= 8 units, 351-400= 10 units   Yes [provider]  lipase/protease/amylase (CREON) 36000 UNITS CPEP capsule Take 36,000 Units by mouth in the morning and at bedtime.   Yes [provider]  metFORMIN (GLUCOPHAGE) 1000 MG tablet Take 1 tablet (1,000 mg total) by mouth 2 (two) times daily with a meal. 12/23/20  Yes Gherghe, Daylene Katayama, MD  metoprolol tartrate (LOPRESSOR) 100 MG tablet Take 100 mg by mouth daily. 10/25/21  Yes  [provider]  Multiple Vitamin (MULTIVITAMIN WITH MINERALS) TABS tablet Take 1 tablet by mouth daily. 12/23/20  Yes Gherghe, Daylene Katayama, MD  rosuvastatin (CRESTOR) 5 MG tablet Take 5 mg by mouth at bedtime.   Yes [provider]  tamsulosin (FLOMAX) 0.4 MG CAPS capsule Take 0.4 mg by mouth daily in the afternoon.   Yes [provider]  traZODone (DESYREL) 150 MG tablet Take 1 tablet (150 mg total) by mouth at bedtime. 12/23/20  Yes Leatha Gilding, MD  Past Surgical History Past Surgical History:  Procedure Laterality Date   CARDIAC CATHETERIZATION N/A 03/15/2015   Procedure: Left Heart Cath;  Surgeon: Laurier Nancy, MD;  Location: [facility] INVASIVE CV LAB;  Service: Cardiovascular;  Laterality: N/A;   CARDIAC CATHETERIZATION N/A 03/16/2015   Procedure: Coronary Stent Intervention;  Surgeon: Alwyn Pea, MD;  Location: [facility] INVASIVE CV LAB;  Service: Cardiovascular;  Laterality: N/A;   CAROTID ANGIOGRAM N/A 06/15/2012   Procedure: CAROTID ANGIOGRAM;  Surgeon: Chuck Hint, MD;  Location[facility];  Service: Cardiovascular;  Laterality: N/A;   ESOPHAGOGASTRODUODENOSCOPY N/A 03/08/2016   Procedure: ESOPHAGOGASTRODUODENOSCOPY (EGD);  Surgeon: Ruffin Frederick, MD;  Location: [facility] ENDOSCOPY;  Service: Gastroenterology;  Laterality: N/A;   none     PERIPHERAL VASCULAR CATHETERIZATION Left 04/06/2015   Procedure: Carotid PTA/Stent Intervention;  Surgeon: Annice Needy, MD;  Location: [facility] INVASIVE CV LAB;  Service: Cardiovascular;  Laterality: Left;   Family History Family History  Problem Relation Age of Onset   Stroke Mother        deceased   Coronary artery disease Mother    Alcohol abuse Mother    Cancer Mother    Hypertension Father        alive   Alcohol abuse Father    Diabetes Father    Kidney disease Father     Stroke Maternal Grandmother     Social History Social History   Tobacco Use   Smoking status: Every Day    Current packs/day: 1.00    Average packs/day: 1 pack/day for 30.0 years (30.0 ttl pk-yrs)    Types: Cigarettes   Smokeless tobacco: Never   Tobacco comments:    smoking less  Substance Use Topics   Alcohol use: No    Alcohol/week: 6.0 standard drinks of alcohol    Types: 6 Cans of beer per week    Comment: drinks 6-12 beer daily   Drug use: No   Allergies Patient has no known allergies.  Review of Systems Review of Systems  Neurological:  Positive for headaches.    Physical Exam Vital Signs  I have reviewed the triage vital signs BP 93/62 (BP Location: Left Arm)   Pulse 75   Temp 98.2 F (36.8 C) (Oral)   Resp 18   Ht 6\' 1"  (1.854 m)   Wt 72.7 kg   SpO2 99%   BMI 21.15 kg/m   Physical Exam Constitutional:      General: He is not in acute distress.    Appearance: Normal appearance.  HENT:     Head: Normocephalic.     Comments: 2 cm stellate laceration over the left eyebrow    Nose: No congestion or rhinorrhea.  Eyes:     General:        Right eye: No discharge.        Left eye: No discharge.     Extraocular Movements: Extraocular movements intact.     Pupils: Pupils are equal, round, and reactive to light.  Cardiovascular:     Rate and Rhythm: Normal rate and regular rhythm.     Heart sounds: No murmur heard. Pulmonary:     Effort: No respiratory distress.     Breath sounds: No wheezing or rales.  Abdominal:     General: There is no distension.     Tenderness: There is no abdominal tenderness.  Musculoskeletal:        General: Normal range of motion.     Cervical back: Normal  range of motion.  Skin:    General: Skin is warm and dry.  Neurological:     General: No focal deficit present.     Mental Status: He is alert.     ED Results and Treatments Labs (all labs ordered are listed, but only abnormal results are displayed) Labs  Reviewed  CBC WITH DIFFERENTIAL/PLATELET - Abnormal; Notable for the following components:      Result Value   RBC 3.57 (*)    Hemoglobin 11.2 (*)    HCT 34.8 (*)    All other components within normal limits  COMPREHENSIVE METABOLIC PANEL - Abnormal; Notable for the following components:   Glucose, Bld 161 (*)    Albumin 3.4 (*)    Alkaline Phosphatase 252 (*)    All other components within normal limits  COMPREHENSIVE METABOLIC PANEL - Abnormal; Notable for the following components:   Glucose, Bld 161 (*)    Creatinine, Ser 0.60 (*)    Total Protein 6.1 (*)    Albumin 2.9 (*)    Alkaline Phosphatase 222 (*)    All other components within normal limits  CBC - Abnormal; Notable for the following components:   RBC 3.44 (*)    Hemoglobin 10.7 (*)    HCT 32.7 (*)    All other components within normal limits  HEMOGLOBIN A1C - Abnormal; Notable for the following components:   Hgb A1c MFr Bld 8.6 (*)    All other components within normal limits  GLUCOSE, CAPILLARY - Abnormal; Notable for the following components:   Glucose-Capillary 135 (*)    All other components within normal limits  GLUCOSE, CAPILLARY - Abnormal; Notable for the following components:   Glucose-Capillary 159 (*)    All other components within normal limits  HIV ANTIBODY (ROUTINE TESTING W REFLEX)  CBG MONITORING, ED                                                                                                                          Radiology CT Head Wo Contrast  Result Date: 07/30/2023 CLINICAL DATA:  Head trauma.  Follow-up intracranial bleed. EXAM: CT HEAD WITHOUT CONTRAST TECHNIQUE: Contiguous axial images were obtained from the base of the skull through the vertex without intravenous contrast. RADIATION DOSE REDUCTION: This exam was performed according to the departmental dose-optimization program which includes automated exposure control, adjustment of the mA and/or kV according to patient size and/or use  of iterative reconstruction technique. COMPARISON:  Earlier CT dated 07/30/2023. FINDINGS: Brain: Slight interval increase in the size of intraparenchymal bleed/hemorrhagic contusion of the medial left frontal lobe (15/2) measuring 13 mm in diameter (previously approximately 7 mm). Additional hemorrhagic foci in the frontal lobes bilaterally are relatively similar to prior CT. There is moderate age-related atrophy and chronic microvascular ischemic changes. Old right frontal infarct and encephalomalacia. No mass effect or midline shift. No extra-axial fluid collection. Vascular: No hyperdense vessel or unexpected calcification. Skull: Normal. Negative for fracture or focal lesion.  Sinuses/Orbits: No acute finding. Other: None IMPRESSION: 1. Slight interval increase in the size of intraparenchymal bleed/hemorrhagic contusion of the medial left frontal lobe. 2. Additional hemorrhagic foci in the frontal lobes bilaterally are relatively similar to prior CT. 3. Moderate age-related atrophy and chronic microvascular ischemic changes. Old right frontal infarct and encephalomalacia. Electronically Signed   By: Elgie Collard M.D.   On: 07/30/2023 22:47   DG Knee 1-2 Views Left  Result Date: 07/30/2023 CLINICAL DATA:  Fall.  Left knee pain. EXAM: LEFT KNEE - 1-2 VIEW COMPARISON:  None Available. FINDINGS: No acute fracture or dislocation. No aggressive osseous lesion. No significant arthritis of the knee joint. No knee effusion or focal soft tissue swelling. No radiopaque foreign bodies. IMPRESSION: No acute osseous abnormality Electronically Signed   By: Jules Schick M.D.   On: 07/30/2023 19:04   DG Shoulder Left Port  Result Date: 07/30/2023 CLINICAL DATA:  Larey Seat out of wheelchair, left shoulder injury and abrasion EXAM: LEFT SHOULDER COMPARISON:  01/14/2022 FINDINGS: Internal rotation, external rotation, transscapular views of the left shoulder are obtained. There is a small curvilinear ossific density  adjacent to the greater tuberosity of the left humerus, best seen on the internal and external rotation views. This could reflect sequela from calcific tendinopathy or small avulsion fracture given history of recent trauma. Correlation with physical exam recommended. There is mild superficial soft tissue swelling overlying the humeral head and acromion process. Mild degenerative changes of the acromioclavicular and glenohumeral joints. Left chest is clear. IMPRESSION: 1. Small ossific density adjacent to the greater tuberosity of the left humerus, which could reflect a small acute fracture versus sequela of calcific rotator cuff tendinopathy. Please correlate with physical exam findings. 2. Mild superficial soft tissue swelling overlying the left shoulder. 3. Mild acromioclavicular and glenohumeral joint osteoarthritis. Electronically Signed   By: Sharlet Salina M.D.   On: 07/30/2023 17:09   DG Pelvis Portable  Result Date: 07/30/2023 CLINICAL DATA:  Larey Seat out of wheelchair EXAM: PORTABLE PELVIS 1-2 VIEWS COMPARISON:  06/16/2012 FINDINGS: Single frontal view of the pelvis includes both hips. No acute fracture, subluxation, or dislocation. Mild bilateral hip osteoarthritis. Prominent spondylosis and facet hypertrophy at the lumbosacral junction. Sacroiliac joints are unremarkable. IMPRESSION: 1. Degenerative changes of the lumbar spine and bilateral hips. 2. No acute fracture. Electronically Signed   By: Sharlet Salina M.D.   On: 07/30/2023 17:07   DG CHEST PORT 1 VIEW  Result Date: 07/30/2023 CLINICAL DATA:  Larey Seat out of wheelchair, hit head EXAM: PORTABLE CHEST 1 VIEW COMPARISON:  01/14/2022 FINDINGS: Single frontal view of the chest demonstrates an unremarkable cardiac silhouette. No acute airspace disease, effusion, or pneumothorax. There are no acute displaced fractures. IMPRESSION: 1. No acute intrathoracic process. Electronically Signed   By: Sharlet Salina M.D.   On: 07/30/2023 17:06   CT Head Wo  Contrast  Result Date: 07/30/2023 CLINICAL DATA:  Fall on Plavix EXAM: CT HEAD WITHOUT CONTRAST CT CERVICAL SPINE WITHOUT CONTRAST TECHNIQUE: Multidetector CT imaging of the head and cervical spine was performed following the standard protocol without intravenous contrast. Multiplanar CT image reconstructions of the cervical spine were also generated. RADIATION DOSE REDUCTION: This exam was performed according to the departmental dose-optimization program which includes automated exposure control, adjustment of the mA and/or kV according to patient size and/or use of iterative reconstruction technique. COMPARISON:  01/07/2022 CT head and cervical spine FINDINGS: CT HEAD FINDINGS Brain: Multiple hyperdense foci in the medial frontal lobes bilaterally (series 3, images  15-20 and 25-27), most likely parenchymal and subarachnoid hemorrhage. No significant subdural or epidural collection. Redemonstrated global parenchymal volume loss with prominence the ventricles and extra-axial spaces, which appear advanced for age but unchanged from the prior exam. Encephalomalacia and gliosis in right frontal lobe is again noted. Additional remote infarct in the right caudate head. No evidence of acute infarct, mass, mass effect, or midline shift. No hydrocephalus. Vascular: No hyperdense vessel. Skull: Negative for fracture or focal lesion. Sinuses/Orbits: No acute finding. Other: The mastoid air cells are well aerated. CT CERVICAL SPINE FINDINGS Alignment: No traumatic listhesis. Trace retrolisthesis of C3 on C4, C4 on C5, and C5 on C6, which appears facet mediated. Levocurvature is likely positional. Skull base and vertebrae: No acute fracture or suspicious osseous lesion. Unchanged compression deformity of T1. Soft tissues and spinal canal: No prevertebral fluid or swelling. No visible canal hematoma. Disc levels: Degenerative changes in the cervical spine.Moderate to severe spinal canal stenosis at C3-C4 and C4-C5. Upper  chest: No focal pulmonary opacity or pleural effusion. IMPRESSION: 1. Multiple hyperdense foci in the medial frontal lobes bilaterally, most likely parenchymal and subarachnoid hemorrhage. No significant subdural or epidural collection. No significant mass effect or midline shift. 2. No acute fracture or traumatic listhesis in the cervical spine. These results were called by telephone at the time of interpretation on 07/30/2023 at 3:05 pm to provider Kelen Laura [facility] , who verbally acknowledged these results. Electronically Signed   By: Wiliam Ke M.D.   On: 07/30/2023 15:05   CT Cervical Spine Wo Contrast  Result Date: 07/30/2023 CLINICAL DATA:  Fall on Plavix EXAM: CT HEAD WITHOUT CONTRAST CT CERVICAL SPINE WITHOUT CONTRAST TECHNIQUE: Multidetector CT imaging of the head and cervical spine was performed following the standard protocol without intravenous contrast. Multiplanar CT image reconstructions of the cervical spine were also generated. RADIATION DOSE REDUCTION: This exam was performed according to the departmental dose-optimization program which includes automated exposure control, adjustment of the mA and/or kV according to patient size and/or use of iterative reconstruction technique. COMPARISON:  01/07/2022 CT head and cervical spine FINDINGS: CT HEAD FINDINGS Brain: Multiple hyperdense foci in the medial frontal lobes bilaterally (series 3, images 15-20 and 25-27), most likely parenchymal and subarachnoid hemorrhage. No significant subdural or epidural collection. Redemonstrated global parenchymal volume loss with prominence the ventricles and extra-axial spaces, which appear advanced for age but unchanged from the prior exam. Encephalomalacia and gliosis in right frontal lobe is again noted. Additional remote infarct in the right caudate head. No evidence of acute infarct, mass, mass effect, or midline shift. No hydrocephalus. Vascular: No hyperdense vessel. Skull: Negative for fracture or focal  lesion. Sinuses/Orbits: No acute finding. Other: The mastoid air cells are well aerated. CT CERVICAL SPINE FINDINGS Alignment: No traumatic listhesis. Trace retrolisthesis of C3 on C4, C4 on C5, and C5 on C6, which appears facet mediated. Levocurvature is likely positional. Skull base and vertebrae: No acute fracture or suspicious osseous lesion. Unchanged compression deformity of T1. Soft tissues and spinal canal: No prevertebral fluid or swelling. No visible canal hematoma. Disc levels: Degenerative changes in the cervical spine.Moderate to severe spinal canal stenosis at C3-C4 and C4-C5. Upper chest: No focal pulmonary opacity or pleural effusion. IMPRESSION: 1. Multiple hyperdense foci in the medial frontal lobes bilaterally, most likely parenchymal and subarachnoid hemorrhage. No significant subdural or epidural collection. No significant mass effect or midline shift. 2. No acute fracture or traumatic listhesis in the cervical spine. These results were called by telephone  at the time of interpretation on 07/30/2023 at 3:05 pm to provider Karey Suthers [facility] , who verbally acknowledged these results. Electronically Signed   By: Wiliam Ke M.D.   On: 07/30/2023 15:05    Pertinent labs & imaging results that were available during my care of the patient were reviewed by me and considered in my medical decision making (see MDM for details).  Medications Ordered in ED Medications  levETIRAcetam (KEPPRA) tablet 500 mg (500 mg Oral Given 07/31/23 0838)  sodium chloride flush (NS) 0.9 % injection 3 mL (3 mLs Intravenous Not Given 07/30/23 2019)  acetaminophen (TYLENOL) tablet 650 mg (650 mg Oral Given 07/31/23 0135)    Or  acetaminophen (TYLENOL) suppository 650 mg ( Rectal See Alternative 07/31/23 0135)  senna-docusate (Senokot-S) tablet 1 tablet (has no administration in time range)  nicotine (NICODERM CQ - dosed in mg/24 hours) patch 14 mg (14 mg Transdermal Patch Applied 07/31/23 0839)  lidocaine (LIDODERM) 5 %  2 patch (2 patches Transdermal Not Given 07/30/23 1931)  famotidine (PEPCID) tablet 20 mg (20 mg Oral Given 07/31/23 0838)  ferrous sulfate tablet 325 mg (325 mg Oral Given 07/31/23 0839)  folic acid (FOLVITE) tablet 1 mg (1 mg Oral Given 07/31/23 0838)  lipase/protease/amylase (CREON) capsule 72,000 Units (72,000 Units Oral Given 07/31/23 1016)  metoprolol tartrate (LOPRESSOR) tablet 100 mg (100 mg Oral Given 07/31/23 0838)  traZODone (DESYREL) tablet 150 mg (150 mg Oral Given 07/30/23 2144)  rosuvastatin (CRESTOR) tablet 5 mg (has no administration in time range)  tamsulosin (FLOMAX) capsule 0.4 mg (has no administration in time range)  feeding supplement (GLUCERNA SHAKE) (GLUCERNA SHAKE) liquid 237 mL (237 mLs Oral Given 07/31/23 0839)  lidocaine (LIDODERM) 5 % 1 patch (1 patch Transdermal Patch Removed 07/31/23 0641)  insulin aspart (novoLOG) injection 0-9 Units (2 Units Subcutaneous Given 07/31/23 0643)  insulin aspart (novoLOG) injection 0-5 Units ( Subcutaneous Not Given 07/30/23 2140)  feeding supplement (ENSURE ENLIVE / ENSURE PLUS) liquid 237 mL (237 mLs Oral Not Given 07/31/23 0839)  mupirocin ointment (BACTROBAN) 2 % ( Topical Not Given 07/31/23 1104)  Gerhardt's butt cream ( Topical Not Given 07/31/23 1104)  oxyCODONE (Oxy IR/ROXICODONE) immediate release tablet 5 mg (5 mg Oral Given 07/31/23 1016)  busPIRone (BUSPAR) tablet 10 mg (has no administration in time range)  lactated ringers bolus 1,000 mL (0 mLs Intravenous Stopped 07/30/23 1526)  lidocaine-EPINEPHrine (XYLOCAINE W/EPI) 2 %-1:200000 (PF) injection 10 mL (10 mLs Intradermal Given 07/30/23 1538)                                                                                                                                     Procedures .Critical Care  Performed by: Glendora Score, MD Authorized by: Glendora Score, MD   Critical care provider statement:    Critical care time (minutes):  30   Critical care was necessary to treat or  prevent imminent or life-threatening deterioration  of the following conditions:  Circulatory failure and trauma   Critical care was time spent personally by me on the following activities:  Development of treatment plan with patient or surrogate, discussions with consultants, evaluation of patient's response to treatment, examination of patient, ordering and review of laboratory studies, ordering and review of radiographic studies, ordering and performing treatments and interventions, pulse oximetry, re-evaluation of patient's condition and review of old charts .Marland KitchenLaceration Repair  Date/Time: 07/31/2023 11:46 AM  Performed by: Glendora Score, MD Authorized by: Glendora Score, MD   Anesthesia:    Anesthesia method:  Local infiltration   Local anesthetic:  Lidocaine 2% WITH epi Laceration details:    Location:  Face   Face location:  L eyebrow   Length (cm):  3 Treatment:    Amount of cleaning:  Standard   Irrigation solution:  Sterile saline Skin repair:    Repair method:  Sutures   Suture size:  4-0   Suture material:  Chromic gut   Suture technique:  Simple interrupted   Number of sutures:  4 Approximation:    Approximation:  Close Repair type:    Repair type:  Simple Post-procedure details:    Dressing:  Non-adherent dressing   (including critical care time)  Medical Decision Making / ED Course   This patient presents to the ED for concern of fall, head injury, hypotension, this involves an extensive number of treatment options, and is a complaint that carries with it a high risk of complications and morbidity.  The differential diagnosis includes fracture, contusion, hematoma, ligamentous injury, closed head injury, ICH, laceration, intrathoracic injury, intra-abdominal injury  MDM: Patient seen emergency room for evaluation of a fall.  Physical exam reveals a disheveled patient with right-sided hemiparesis, 2 cm stellate laceration to the eyebrow on the left, abrasion  over left shoulder but patient is alert answering questions.  As patient arrives hypotensive in the setting of a fall on blood thinners he technically meets Level One criteria and the patient was temporarily upgraded to a level 1 trauma.  Fluid resuscitation begun and after approximately 200 cc of fluid his hypotension resolved.  He was then downgraded back to level 2 trauma after discussion with trauma surgery on-call.  Do suspect the hypotension came prior to his fall not because of his fall.  He states that they "do not feed [place] anything" in his facility and he is feeling dehydrated.  Laboratory evaluation with a hemoglobin of 11.2 which is improved from patient's baseline, albumin 3.4 but is otherwise unremarkable.  PT clinical bed CT head showing multiple hyperdense foci in the medial frontal lobes bilaterally most likely parenchymal and subarachnoid hemorrhage.  No mass effect.  Spoke with neurosurgery NP on-call Esperanza Richters who is recommending Keppra 500 twice daily and repeat 8-hour head CT.  Eyebrow laceration repaired with absorbable sutures.  Patient will require medical admission but at time of signout he is pending completion of trauma imaging with x-ray chest, pelvis and shoulder.  Please see provider signout for continuation of workup.   Additional history obtained:  -External records from outside source obtained and reviewed including: Chart review including previous notes, labs, imaging, consultation notes   Lab Tests: -I ordered, reviewed, and interpreted labs.   The pertinent results include:   Labs Reviewed  CBC WITH DIFFERENTIAL/PLATELET - Abnormal; Notable for the following components:      Result Value   RBC 3.57 (*)    Hemoglobin 11.2 (*)    HCT 34.8 (*)  All other components within normal limits  COMPREHENSIVE METABOLIC PANEL - Abnormal; Notable for the following components:   Glucose, Bld 161 (*)    Albumin 3.4 (*)    Alkaline Phosphatase 252 (*)    All other components  within normal limits  COMPREHENSIVE METABOLIC PANEL - Abnormal; Notable for the following components:   Glucose, Bld 161 (*)    Creatinine, Ser 0.60 (*)    Total Protein 6.1 (*)    Albumin 2.9 (*)    Alkaline Phosphatase 222 (*)    All other components within normal limits  CBC - Abnormal; Notable for the following components:   RBC 3.44 (*)    Hemoglobin 10.7 (*)    HCT 32.7 (*)    All other components within normal limits  HEMOGLOBIN A1C - Abnormal; Notable for the following components:   Hgb A1c MFr Bld 8.6 (*)    All other components within normal limits  GLUCOSE, CAPILLARY - Abnormal; Notable for the following components:   Glucose-Capillary 135 (*)    All other components within normal limits  GLUCOSE, CAPILLARY - Abnormal; Notable for the following components:   Glucose-Capillary 159 (*)    All other components within normal limits  HIV ANTIBODY (ROUTINE TESTING W REFLEX)  CBG MONITORING, ED        Imaging Studies ordered: I ordered imaging studies including CT head, C-spine I independently visualized and interpreted imaging. I agree with the radiologist interpretation  X-ray chest, shoulder, pelvis ordered and is pending   Medicines ordered and prescription drug management: Meds ordered this encounter  Medications   lactated ringers bolus 1,000 mL   lidocaine-EPINEPHrine (XYLOCAINE W/EPI) 2 %-1:200000 (PF) injection    Myles, Latoya S: cabinet override   levETIRAcetam (KEPPRA) tablet 500 mg   lidocaine-EPINEPHrine (XYLOCAINE W/EPI) 2 %-1:200000 (PF) injection 10 mL   sodium chloride flush (NS) 0.9 % injection 3 mL   OR Linked Order Group    acetaminophen (TYLENOL) tablet 650 mg    acetaminophen (TYLENOL) suppository 650 mg   senna-docusate (Senokot-S) tablet 1 tablet   nicotine (NICODERM CQ - dosed in mg/24 hours) patch 14 mg   lidocaine (LIDODERM) 5 % 2 patch   famotidine (PEPCID) tablet 20 mg   DISCONTD: feeding supplement (GLUCERNA SHAKE) (GLUCERNA  SHAKE) liquid 237 mL   ferrous sulfate tablet 325 mg   folic acid (FOLVITE) tablet 1 mg   lipase/protease/amylase (CREON) capsule 72,000 Units   metoprolol tartrate (LOPRESSOR) tablet 100 mg   traZODone (DESYREL) tablet 150 mg   rosuvastatin (CRESTOR) tablet 5 mg   tamsulosin (FLOMAX) capsule 0.4 mg   feeding supplement (GLUCERNA SHAKE) (GLUCERNA SHAKE) liquid 237 mL   lidocaine (LIDODERM) 5 % 1 patch   insulin aspart (novoLOG) injection 0-9 Units    Order Specific Question:   Correction coverage:    Answer:   Sensitive (thin, NPO, renal)    Order Specific Question:   CBG < 70:    Answer:   Implement Hypoglycemia Standing Orders and refer to Hypoglycemia Standing Orders sidebar report    Order Specific Question:   CBG 70 - 120:    Answer:   0 units    Order Specific Question:   CBG 121 - 150:    Answer:   1 unit    Order Specific Question:   CBG 151 - 200:    Answer:   2 units    Order Specific Question:   CBG 201 - 250:    Answer:  3 units    Order Specific Question:   CBG 251 - 300:    Answer:   5 units    Order Specific Question:   CBG 301 - 350:    Answer:   7 units    Order Specific Question:   CBG 351 - 400    Answer:   9 units    Order Specific Question:   CBG > 400    Answer:   call MD and obtain STAT lab verification   insulin aspart (novoLOG) injection 0-5 Units    Order Specific Question:   Correction coverage:    Answer:   HS scale    Order Specific Question:   CBG < 70:    Answer:   Implement Hypoglycemia Standing Orders and refer to Hypoglycemia Standing Orders sidebar report    Order Specific Question:   CBG 70 - 120:    Answer:   0 units    Order Specific Question:   CBG 121 - 150:    Answer:   0 units    Order Specific Question:   CBG 151 - 200:    Answer:   0 units    Order Specific Question:   CBG 201 - 250:    Answer:   2 units    Order Specific Question:   CBG 251 - 300:    Answer:   3 units    Order Specific Question:   CBG 301 - 350:    Answer:    4 units    Order Specific Question:   CBG 351 - 400:    Answer:   5 units    Order Specific Question:   CBG > 400    Answer:   call MD and obtain STAT lab verification   DISCONTD: feeding supplement (ENSURE ENLIVE / ENSURE PLUS) liquid 237 mL   feeding supplement (ENSURE ENLIVE / ENSURE PLUS) liquid 237 mL   mupirocin ointment (BACTROBAN) 2 %   Gerhardt's butt cream   oxyCODONE (Oxy IR/ROXICODONE) immediate release tablet 5 mg   busPIRone (BUSPAR) tablet 10 mg    -I have reviewed the patients home medicines and have made adjustments as needed  Critical interventions Trauma activation and evaluation, fluid resuscitation, neurosurgery consultation  Consultations Obtained: I requested consultation with the neurosurgeon on-call,  and discussed lab and imaging findings as well as pertinent plan - they recommend: Keppra 500 twice daily, repeat head CT in 8 hours, medical admission   Cardiac Monitoring: The patient was maintained on a cardiac monitor.  I personally viewed and interpreted the cardiac monitored which showed an underlying rhythm of: NSR  Social Determinants of Health:  Factors impacting patients care include: Lives in a facility   Reevaluation: After the interventions noted above, I reevaluated the patient and found that they have :improved  Co morbidities that complicate the patient evaluation  Past Medical History:  Diagnosis Date   Alcohol dependency (HCC)    Hx ETOH withdrawal seizures before 2011   CAD (coronary artery disease) 06/13/2012   Calcification noted on CTA of chest in 2012 Wall motion abnormality on ECHO     Carotid artery disease (HCC) 2016   bilateral.  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    Depression with anxiety    Diabetes mellitus without complication (HCC)    GERD (gastroesophageal reflux disease)    Headache(784.0)  migraine   Heart disease  02/2015   PCI/DES placed to RCA: on chronic Plavix/ASA   Hyperlipidemia    Hypertension    Pancreatitis 2011....    CT findings in May 2011 with inflammation and pseudocyst.  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/2016   Renal artery stenosis, native, bilateral (HCC) 02/2015      Dispostion: I considered admission for this patient, and disposition pending completion of trauma imaging but patient will require hospital admission for new traumatic brain bleed     Final Clinical Impression(s) / ED Diagnoses Final diagnoses:  Fall, initial encounter  Traumatic intracerebral hemorrhage with unknown loss of consciousness status, unspecified laterality, initial encounter [facility])     @PCDICTATION @    Glendora Score, MD 07/31/23 1148

## 2023-07-31 NOTE — Progress Notes (Signed)
Transition of Care [facility]) - CAGE-AID Screening   Patient Details  Name: Brandon [name] MRN: [id number] Date of Birth: 1965-12-31  Transition of Care [facility]) CM/SW Contact:    Leota Sauers, RN Phone Number: 07/31/2023, 6:48 AM   Clinical Narrative:  Patient denies use of alcohol and illicit substances. Resources not given at this time.   CAGE-AID Screening:    Have You Ever Felt You Ought to Cut Down on Your Drinking or Drug Use?: No Have People Annoyed You By Critizing Your Drinking Or Drug Use?: No Have You Felt Bad Or Guilty About Your Drinking Or Drug Use?: No Have You Ever Had a Drink or Used Drugs First Thing In The Morning to Steady Your Nerves or to Get Rid of a Hangover?: No CAGE-AID Score: 0  Substance Abuse Education Offered: No

## 2023-07-31 NOTE — Evaluation (Addendum)
Occupational Therapy Evaluation Patient Details Name: Brandon [name] MRN: [id number] DOB: 06-09-1966 Today's Date: 07/31/2023   History of Present Illness 57 yo male who presented to ED 11/6  from [facility] after a fall while trying to stand up from his Rollator and turn around Found to have Acute Traumatic Intraparenchymal and Intracerebral Hemorrhage in addition to multiple abrasions. Questionable L humerus fracture, MRI pending. PMH of CAD, HTN, DM type 2, HLD, MDD, and hx of CVA PMH includes: alcohol abuse in remission, CAD, CVA, insulin dependent DM, HTN, malnutrition.   Clinical Impression   Brandon [name] was evaluated s/p the above admission list. He is from SNF, ambulatory with rollator and completes his own ADLs at baseline - per pt's report. Upon evaluation the pt was limited by impaired cognition, pain, generalized weakness, unknown fractures and limited activity tolerance. Overall he needed min A for bed mobility and mod A +2 for a lateral scoot transfer from the bed>chair. Due to the deficits listed below the pt also needs up to mod A for LB ADLs and set up A for UB ADLs. Pt will benefit from continued acute OT services and d/c back to skilled inpatient follow up therapy, <3 hours/day.        If plan is discharge home, recommend the following: A lot of help with walking and/or transfers;Two people to help with walking and/or transfers;Two people to help with bathing/dressing/bathroom;A lot of help with bathing/dressing/bathroom;Direct supervision/assist for medications management;Assist for transportation;Direct supervision/assist for financial management;Help with stairs or ramp for entrance    Functional Status Assessment  Patient has had a recent decline in their functional status and demonstrates the ability to make significant improvements in function in a reasonable and predictable amount of time.  Equipment Recommendations  None recommended by OT       Precautions /  Restrictions Precautions Precautions: Fall Precaution Comments: admission result of fall, known to have multiple falls at his facility Restrictions Weight Bearing Restrictions: No      Mobility Bed Mobility      Transfers Overall transfer level: Needs assistance  Equipment used: 2 person hand held assist Transfers: Bed to chair/wheelchair/BSC            Lateral/Scoot Transfers: Mod assist, +2 physical assistance, +2 safety/equipment  General transfer comment: lateral scoot due to L hip and LUE       Balance Overall balance assessment: Needs assistance Sitting-balance support: Feet supported Sitting balance-Leahy Scale: Fair             ADL either performed or assessed with clinical judgement   ADL Overall ADL's : Needs assistance/impaired Eating/Feeding: Set up;Bed level   Grooming: Set up;Sitting   Upper Body Bathing: Set up;Sitting   Lower Body Bathing: Moderate assistance;Sitting/lateral leans   Upper Body Dressing : Set up;Sitting   Lower Body Dressing: Moderate assistance;Sitting/lateral leans   Toilet Transfer: Moderate assistance;+2 for physical assistance;+2 for safety/equipment Toilet Transfer Details (indicate cue type and reason): lateral scoot Toileting- Clothing Manipulation and Hygiene: Contact guard assist;Sitting/lateral lean       Functional mobility during ADLs: Moderate assistance;+2 for physical assistance;+2 for safety/equipment General ADL Comments: limited due to LUE and LLE pain     Vision Baseline Vision/History: 0 No visual deficits Vision Assessment?: No apparent visual deficits     Perception Perception: Within Functional Limits       Praxis Praxis: WFL       Pertinent Vitals/Pain Pain Assessment Pain Score: 8  Extremity/Trunk Assessment Upper Extremity Assessment Upper Extremity Assessment: Defer to OT evaluation LUE Deficits / Details: questionable L humerus fx, pt reports pain. guarding extremity  throughout session LUE Sensation: WNL LUE Coordination: decreased gross motor   Lower Extremity Assessment Lower Extremity Assessment: Difficult to assess due to impaired cognition;RLE deficits/detail;LLE deficits/detail (declines "therapy" today only agreeable to sliding to drop arm recliner on his R     Cervical / Trunk Assessment Cervical / Trunk Assessment: Kyphotic   Communication Communication Communication: No apparent difficulties Cueing Techniques: Verbal cues;Tactile cues;Visual cues      General Comments  VSS on RA            Home Living Family/patient expects to be discharged to:: Skilled nursing facility     Type of Home: Other(Comment) (SNF [facility])         Additional Comments: Per chart, pt at SNF since 2023      Prior Functioning/Environment Prior Level of Function : Needs assist             Mobility Comments: questionable historian, ambulates with rollator. declines recent falls beyond this one leading to admit ADLs Comments: questionable historian, pt reports he bathes in the shower, ambulates to bathroom and completes own ADLs.        OT Problem List: Decreased range of motion;Decreased activity tolerance;Decreased strength;Impaired balance (sitting and/or standing);Decreased safety awareness;Decreased knowledge of precautions;Decreased knowledge of use of DME or AE;Impaired UE functional use      OT Treatment/Interventions: Self-care/ADL training;DME and/or AE instruction;Therapeutic activities;Patient/family education;Balance training    OT Goals(Current goals can be found in the care plan section) Acute Rehab OT Goals Patient Stated Goal: to eat OT Goal Formulation: With patient Time For Goal Achievement: 08/14/23 Potential to Achieve Goals: Good ADL Goals Pt Will Perform Grooming: with modified independence Pt Will Perform Upper Body Dressing: with modified independence Pt Will Perform Lower Body Dressing: with min assist Pt  Will Transfer to Toilet: with min assist;stand pivot transfer;bedside commode  OT Frequency: Min 1X/week    Co-evaluation PT/OT/SLP Co-Evaluation/Treatment: Yes Reason for Co-Treatment: Complexity of the patient's impairments (multi-system involvement);For patient/therapist safety;To address functional/ADL transfers   OT goals addressed during session: ADL's and self-care      AM-PAC OT "6 Clicks" Daily Activity     Outcome Measure Help from another person eating meals?: A Little Help from another person taking care of personal grooming?: A Little Help from another person toileting, which includes using toliet, bedpan, or urinal?: A Lot Help from another person bathing (including washing, rinsing, drying)?: A Lot Help from another person to put on and taking off regular upper body clothing?: A Little Help from another person to put on and taking off regular lower body clothing?: A Lot 6 Click Score: 15   End of Session Nurse Communication: Mobility status  Activity Tolerance: Patient tolerated treatment well Patient left: in chair;with call bell/phone within reach;with chair alarm set  OT Visit Diagnosis: Unsteadiness on feet (R26.81);Other abnormalities of gait and mobility (R26.89);Muscle weakness (generalized) (M62.81)                Time: 1914-7829 OT Time Calculation (min): 20 min Charges:  OT General Charges $OT Visit: 1 Visit OT Evaluation $OT Eval Moderate Complexity: 1 Mod  Derenda Mis, OTR/L Acute Rehabilitation Services Office [phone] Secure Chat Communication Preferred   Donia Pounds 07/31/2023, 1:56 PM

## 2023-07-31 NOTE — Progress Notes (Signed)
Summary: Brandon [name] is a 57 yo male with PMH of Recurrent Falls, CAD, HTN, DM type 2, HLD, GAD, and Hx of Stroke who presented from SNF after a mechanical fall. Patient sustained a head injury and multiple abrasions. CT Head showed intraparenchymal and subarachnoid hemorrhage with slight expansion on repeat imaging. NSGY does not recommend intervention or further imaging at this time. Patient has no new focal deficits and is stable.  Subjective: Patient was seen and examined at bedside today. He still endorses left shoulder pain and states the Tylenol has not helped much. Patient is more sleepy on exam and states that he did not sleep much yesterday. He was easy to wake and more alert towards end of exam and began eating breakfast. He denies any headache, changes in vision, nausea, vomiting, or new focal deficits.  Objective:  Vital signs in last 24 hours: Vitals:   07/31/23 0123 07/31/23 0500 07/31/23 0500 07/31/23 0741  BP: (!) 125/99  109/77 93/62  Pulse: 73  71 75  Resp: 18   18  Temp: 98.1 F (36.7 C)  98 F (36.7 C) 98.2 F (36.8 C)  TempSrc: Oral  Oral Oral  SpO2: 100%  100% 99%  Weight:  72.7 kg    Height:       CBC    Component Value Date/Time   WBC 8.8 07/31/2023 0632   RBC 3.44 (L) 07/31/2023 0632   HGB 10.7 (L) 07/31/2023 0632   HCT 32.7 (L) 07/31/2023 0632   PLT 273 07/31/2023 0632   MCV 95.1 07/31/2023 0632   MCH 31.1 07/31/2023 0632   MCHC 32.7 07/31/2023 0632   RDW 14.8 07/31/2023 0632   LYMPHSABS 1.8 07/30/2023 1424   MONOABS 0.4 07/30/2023 1424   EOSABS 0.1 07/30/2023 1424   BASOSABS 0.1 07/30/2023 1424    CMP     Component Value Date/Time   NA 136 07/31/2023 0632   K 3.7 07/31/2023 0632   CL 102 07/31/2023 0632   CO2 23 07/31/2023 0632   GLUCOSE 161 (H) 07/31/2023 0632   BUN 9 07/31/2023 0632   CREATININE 0.60 (L) 07/31/2023 0632   CREATININE 0.62 (L) 03/15/2020 1032   CALCIUM 9.1 07/31/2023 0632   PROT 6.1 (L) 07/31/2023 0632   ALBUMIN 2.9  (L) 07/31/2023 0632   AST 15 07/31/2023 0632   ALT 17 07/31/2023 0632   ALKPHOS 222 (H) 07/31/2023 0632   BILITOT 0.3 07/31/2023 0632   GFRNONAA >60 07/31/2023 0632   GFRNONAA >89 02/12/2012 1419     Physical Exam Constitutional:      General: He is sleeping.     Appearance: He is underweight.  HENT:     Head: Contusion and laceration present.     Comments: Patient has laceration around left eyebrow with contusion. Cardiovascular:     Rate and Rhythm: Normal rate and regular rhythm.     Heart sounds: Murmur heard.     Comments: Systolic murmur Pulmonary:     Effort: Pulmonary effort is normal.     Breath sounds: Normal breath sounds.  Abdominal:     General: Abdomen is flat. Bowel sounds are normal.     Tenderness: There is no abdominal tenderness. There is no guarding or rebound.  Musculoskeletal:     Right lower leg: No edema.     Left lower leg: No edema.  Skin:    General: Skin is warm.     Findings: Signs of injury, laceration and lesion present.  Comments: Left shoulder abrasion  Small bilateral dorsal hand abrasions  Neurological:     Mental Status: He is easily aroused. Mental status is at baseline.      Assessment/Plan:  Principal Problem:   ICH (intracerebral hemorrhage) (HCC)  Acute Traumatic Intraparenchymal and Intracerebral Hemorrhage Multiple Abrasions Recurrent Falls Patient fell while trying to turn around while using 4 wheel walker. He denies any LOC, syncope, or palpitations at time of fall. He has a history of recurrent falls and has had trouble with coordination after his prior strokes. No focal deficits noted on exam. Residual weakness from stroke is noted on the right side of the body. Patient has multiple abrasion around left eyebrow, left shoulder, and both hands. CT Head shows multiple parenchymal and subarachnoid hemorrhages. -NSGY consulted in ED and recommends holding his home Plavix with Keppra 500 mg BID and repeating CT Head at  20:00. Repeat CT Head showed slight increase in size of intraparenchymal bleed/hemorrhagic contusion of the medial left frontal lobe (7 mm to 13 mm). Additional hemorrhagic foci in the frontal lobes bilaterally are unchanged from prior CT. -NSGY recommends continuing Keppra 500 mg BID for 7 days and holding ASA/Plavix for 2 weeks. No additional imaging or interventions recommended at this time. SBP goal <150 -Continue neuro checks -Check orthostatic vitals -Wound care saw patient for multiple abrasions. Nurse attempted to clean wounds and apply dressing but patient refused. Wound care signed off.  Left Shoulder Pain Possible Humoral Fracture vs Rotator Cuff Tendinopathy Patient has history of chronic left shoulder pain. XRAY showed small ossific density adjacent to greater tuberosity of the left humerus. Could be small acute fracture vs calcific rotator cuff tendinopathy. -Tylenol 650 mg Q6 PRN -Lidocaine patch 5% -Adding Oxycodone 5 mg Q6 PRN for pain -Will consult Ortho for further recommendations. They would like CT Shoulder WO Contrast and reevaluate patient based on findings. We will confirm if CT or MRI is best imaging modality.   Chronic Conditions: HTN HLD -Continue Lopressor 100 mg -Continue Crestor 5 mg   DM Patient said that he has not eaten much today.  -SSI -We will reassess insulin requirements tomorrow  Pancreatic Insufficiency -Continue Creon 72,000 units BID  BPH -Continue Flomax 0.4 mg daily   Anemia Patient Hgb 11.2 on admission. Hgb 10.7 today. -Continue Ferrous sulfate 325 mg -Continue Folic acid 1 mg  -Continue to monitor CBC  GERD -Continue Pepcid 20 mg   GAD -Continue Buspirone 10 mg BID -Trazodone 150 mg daily at bedtime   Hx of Stroke CAD s/p DES -Holding home Plavix for 2 weeks -Holding home ASA 81 for 2 weeks   Diet: Diabetic VTE Prophylaxis: SCDs Code: Full Dispo: Admit patient to Observation with expected length of stay less than 2  midnights.    Signed: Scherrie November, MS4  Scherrie November, Medical Student 07/31/2023, 9:49 AM

## 2023-07-31 NOTE — Progress Notes (Addendum)
Initial Nutrition Assessment  DOCUMENTATION CODES:   Severe malnutrition in context of chronic illness  INTERVENTION:   - Liberalize current diet to regular to promote PO intake due to severe malnutrition  - Recommend adjusting Creon frequency to with meals   - Provide MVI with minerals   - Continue Ensure Enlive and increase to TID with meals, each supplement provides 350 kcal and 20 grams of protein. Pt prefers strawberry.   - D/C Glucerna    NUTRITION DIAGNOSIS:   Severe Malnutrition related to chronic illness as evidenced by severe muscle depletion, severe fat depletion.  GOAL:   Patient will meet greater than or equal to 90% of their needs, Weight gain  MONITOR:   PO intake, Supplement acceptance, Labs, Weight trends  REASON FOR ASSESSMENT:   Consult Assessment of nutrition requirement/status  ASSESSMENT:  PMH of pancreatitis, cirrhosis, GERD, ETOH abuse, CAD, T2DM, HLD, HTN, and hx of CVA. Presented from [facility] after a fall.    11/6:  CT Head in the ED shows parenchymal and subarachnoid hemorrhage without significant mass effect or midline shift.   Pt eating Lunch on visit. Pt was alert and able to answer questions, but did seem confused, question if responses are accurate. PTA Pt reports eating 3 "medium sized meals" a day and eats snacks like cookies and chips as well as drink 4-5 boosts per day.  Pt reports he has an appetite but was unable to access food PTA? Pt  uses a walker or wheelchair for mobility.   Pt eating Lunch on visit, pt consumed 100% of his pot roast and some of his mashed potatoes. Pt stated he consumed all of his breakfast this morning. Does not remember what or how much he ate yesterday.   He states he thinks he is losing weight but is unable to say how much. According to documentation he has gained 29 lbs since April. He says his usual body weight is what he weighs now, 160 lbs.   Pt wanted strawberry Boost but informed him we do  not have Boost in Strawberry, was willing to try strawberry Ensure with meals.   Recommend adjusting Creon frequency to with meals. Messaged MD.   Of note, no PI this admission per nsg documentation.   Admit weight: 72.7 kg Current weight: 72.7 kg   07/31/23 72.7 kg  01/14/22 59.2 kg  01/07/22 59.2 kg   Nutritionally Relevant Medications: Scheduled Meds:  famotidine  20 mg Oral BID   feeding supplement  237 mL Oral TID WC   ferrous sulfate  325 mg Oral Daily   folic acid  1 mg Oral Daily   insulin aspart  0-5 Units Subcutaneous QHS   insulin aspart  0-9 Units Subcutaneous TID WC   lipase/protease/amylase  72,000 Units Oral BID   multivitamin with minerals  1 tablet Oral Daily   rosuvastatin  5 mg Oral QHS   Labs Reviewed: Creatinine 0.6, Calcium ionized 1.02 CBG ranges from 135-159 mg/dL over the last 24 hours HgbA1c 8.6  NUTRITION - FOCUSED PHYSICAL EXAM:  Flowsheet Row Most Recent Value  Orbital Region Severe depletion  Upper Arm Region Severe depletion  Thoracic and Lumbar Region Severe depletion  Buccal Region Severe depletion  Temple Region Severe depletion  Clavicle Bone Region Severe depletion  Clavicle and Acromion Bone Region Severe depletion  Scapular Bone Region Severe depletion  Dorsal Hand Severe depletion  Patellar Region Unable to assess  Anterior Thigh Region Unable to assess  Posterior Calf  Region Unable to assess  Edema (RD Assessment) None  Hair Reviewed  Eyes Reviewed  Mouth Unable to assess  Skin Reviewed  Nails Reviewed       Diet Order:   Diet Order             Diet regular Room service appropriate? Yes with Assist; Fluid consistency: Thin  Diet effective now                   EDUCATION NEEDS:   Not appropriate for education at this time  Skin:  Skin Assessment: Skin Integrity Issues: Skin Integrity Issues:: Incisions Incisions: Shoulder, face, right hand. Abrasion  L temple, L shoulder, hands. No noted PI this admission  from nsg.   Last BM:  11/7: type 3  Height:   Ht Readings from Last 1 Encounters:  07/30/23 6\' 1"  (1.854 m)    Weight:   Wt Readings from Last 1 Encounters:  07/31/23 72.7 kg    BMI:  Body mass index is 21.15 kg/m.  Estimated Nutritional Needs:   Kcal:  2200-2500 kcals  Protein:  110-120g  Fluid:  >/= 2.3L  Elliot Dally, RD Registered Dietitian  See Amion for more information

## 2023-08-01 DIAGNOSIS — W19XXXA Unspecified fall, initial encounter: Secondary | ICD-10-CM

## 2023-08-01 DIAGNOSIS — S06360A Traumatic hemorrhage of cerebrum, unspecified, without loss of consciousness, initial encounter: Secondary | ICD-10-CM | POA: Diagnosis not present

## 2023-08-01 LAB — TECHNOLOGIST SMEAR REVIEW: Plt Morphology: NORMAL

## 2023-08-01 LAB — CBC WITH DIFFERENTIAL/PLATELET
Abs Immature Granulocytes: 0.04 10*3/uL (ref 0.00–0.07)
Basophils Absolute: 0.1 10*3/uL (ref 0.0–0.1)
Basophils Relative: 1 %
Eosinophils Absolute: 0.1 10*3/uL (ref 0.0–0.5)
Eosinophils Relative: 1 %
HCT: 34.5 % — ABNORMAL LOW (ref 39.0–52.0)
Hemoglobin: 11.4 g/dL — ABNORMAL LOW (ref 13.0–17.0)
Immature Granulocytes: 1 %
Lymphocytes Relative: 17 %
Lymphs Abs: 1.4 10*3/uL (ref 0.7–4.0)
MCH: 31.6 pg (ref 26.0–34.0)
MCHC: 33 g/dL (ref 30.0–36.0)
MCV: 95.6 fL (ref 80.0–100.0)
Monocytes Absolute: 0.7 10*3/uL (ref 0.1–1.0)
Monocytes Relative: 9 %
Neutro Abs: 5.8 10*3/uL (ref 1.7–7.7)
Neutrophils Relative %: 71 %
Platelets: 257 10*3/uL (ref 150–400)
RBC: 3.61 MIL/uL — ABNORMAL LOW (ref 4.22–5.81)
RDW: 14.9 % (ref 11.5–15.5)
WBC: 8.1 10*3/uL (ref 4.0–10.5)
nRBC: 0 % (ref 0.0–0.2)

## 2023-08-01 LAB — GLUCOSE, CAPILLARY
Glucose-Capillary: 169 mg/dL — ABNORMAL HIGH (ref 70–99)
Glucose-Capillary: 387 mg/dL — ABNORMAL HIGH (ref 70–99)
Glucose-Capillary: 284 mg/dL — ABNORMAL HIGH (ref 70–99)

## 2023-08-01 LAB — CBC
HCT: 33.7 % — ABNORMAL LOW (ref 39.0–52.0)
Hemoglobin: 11 g/dL — ABNORMAL LOW (ref 13.0–17.0)
MCH: 30.8 pg (ref 26.0–34.0)
MCHC: 32.6 g/dL (ref 30.0–36.0)
MCV: 94.4 fL (ref 80.0–100.0)
Platelets: 256 10*3/uL (ref 150–400)
RBC: 3.57 MIL/uL — ABNORMAL LOW (ref 4.22–5.81)
RDW: 14.7 % (ref 11.5–15.5)
WBC: 7.7 10*3/uL (ref 4.0–10.5)
nRBC: 0 % (ref 0.0–0.2)

## 2023-08-01 LAB — LACTATE DEHYDROGENASE: LDH: 102 U/L (ref 98–192)

## 2023-08-01 MED ORDER — NICOTINE 14 MG/24HR TD PT24: 14.0000 mg | MEDICATED_PATCH | Freq: Every day | TRANSDERMAL | Status: DC

## 2023-08-01 MED ORDER — OXYCODONE HCL 5 MG PO TABS: 5.0000 mg | ORAL_TABLET | Freq: Four times a day (QID) | ORAL | 0 refills | Status: AC | PRN

## 2023-08-01 MED ORDER — TAMSULOSIN HCL 0.4 MG PO CAPS: 0.4000 mg | ORAL_CAPSULE | Freq: Every day | ORAL | Status: DC

## 2023-08-01 MED ORDER — TRAZODONE HCL 150 MG PO TABS: 150.0000 mg | ORAL_TABLET | Freq: Every day | ORAL | Status: DC

## 2023-08-01 MED ORDER — GERHARDT'S BUTT CREAM: 1.0000 | TOPICAL_CREAM | Freq: Two times a day (BID) | CUTANEOUS | Status: DC

## 2023-08-01 MED ORDER — SENNOSIDES-DOCUSATE SODIUM 8.6-50 MG PO TABS
1.0000 | ORAL_TABLET | Freq: Two times a day (BID) | ORAL | Status: DC
  Administered 2023-08-01 – 2023-08-04 (×7): 1 via ORAL
  Filled 2023-08-01 (×7): qty 1

## 2023-08-01 MED ORDER — LEVETIRACETAM 500 MG PO TABS: 500.0000 mg | ORAL_TABLET | Freq: Two times a day (BID) | ORAL | Status: DC

## 2023-08-01 MED ORDER — PANCRELIPASE (LIP-PROT-AMYL) 36000-114000 UNITS PO CPEP: 36000.0000 [IU] | ORAL_CAPSULE | Freq: Three times a day (TID) | ORAL | Status: DC

## 2023-08-01 MED ORDER — SENNOSIDES-DOCUSATE SODIUM 8.6-50 MG PO TABS: 1.0000 | ORAL_TABLET | Freq: Two times a day (BID) | ORAL | Status: DC

## 2023-08-01 MED ORDER — MUPIROCIN 2 % EX OINT: TOPICAL_OINTMENT | Freq: Two times a day (BID) | CUTANEOUS | Status: DC

## 2023-08-01 MED ORDER — ENSURE ENLIVE PO LIQD: 237.0000 mL | Freq: Three times a day (TID) | ORAL | Status: DC

## 2023-08-01 NOTE — NC FL2 (Addendum)
[place] MEDICAID FL2 LEVEL OF CARE FORM     IDENTIFICATION  Patient Name: Brandon [name] Birthdate: 12/29/1965 Sex: male Admission Date (Current Location): 07/30/2023  [facility] and [place] Number:  Producer, television/film/video and Address:  [facility], [address] [place], [place] [address]      Provider Number: [id number]  Attending Physician Name and Address:  Earl Lagos, MD  Relative Name and Phone Number:       Current Level of Care: Hospital Recommended Level of Care: Skilled Nursing Facility Prior Approval Number:    Date Approved/Denied:   PASRR Number: [id number]  Discharge Plan: SNF    Current Diagnoses: Patient Active Problem List   Diagnosis Date Noted   Fall 08/01/2023   ICH (intracerebral hemorrhage) (HCC) 07/30/2023   Pain in left shoulder 03/25/2022   Pressure injury of skin 01/08/2022   Frequent falls 01/08/2022   DKA (diabetic ketoacidosis) (HCC) 01/07/2022   Rhabdomyolysis 01/07/2022   Protein-calorie malnutrition, severe 12/23/2020   Hyperosmolar hyperglycemic state (HHS) (HCC) 12/22/2020   Hyperglycemia due to type 2 diabetes mellitus (HCC) 12/21/2020   Hyperglycemia due to diabetes mellitus (HCC) 12/21/2020   Empyema lung (HCC)    Epidural abscess    Diskitis 02/06/2020   Alcohol withdrawal delirium (HCC) 02/06/2020   Epilepsy (HCC) 02/06/2020   Pleural effusion on right 02/06/2020   History of stroke 01/23/2017   Dry eyes    Sleep disturbance    Essential hypertension, benign    Upper GI bleed    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   Fatty liver    Tobacco abuse    Diabetes mellitus type 2 in nonobese (HCC)    History of CVA with residual deficit    Acute lower UTI    Tachycardia    Chronic alcoholic pancreatitis (HCC)    Hyponatremia 03/09/2016   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   Acute blood loss anemia 03/09/2016   Gastric varices     Alcohol abuse    Left-sided neglect    Severe anemia    UGIB (upper gastrointestinal bleed)    Pressure ulcer 03/06/2016   Acute encephalopathy    Cerebral infarction (HCC) 03/05/2016   Carotid stenosis 04/06/2015   CAD in native artery 03/16/2015   Unstable angina pectoris (HCC) 03/15/2015   Neck pain 10/20/2013   Insomnia 12/29/2012   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 02/25/2011   Hepatic steatosis 02/25/2011    Orientation RESPIRATION BLADDER Height & Weight     Time, Self, Situation, Place  Normal Incontinent Weight: 160 lb 4.4 oz (72.7 kg) Height:  6\' 1"  (185.4 cm)  BEHAVIORAL SYMPTOMS/MOOD NEUROLOGICAL BOWEL NUTRITION STATUS      Incontinent Diet (Regular)  AMBULATORY STATUS COMMUNICATION OF NEEDS Skin   Extensive Assist Verbally Bruising, Skin abrasions (Laceration on face and shoulder, Gauze, skin tear on hand)  Personal Care Assistance Level of Assistance  Feeding, Dressing, Bathing Bathing Assistance: Limited assistance Feeding assistance: Limited assistance Dressing Assistance: Maximum assistance     Functional Limitations Info             SPECIAL CARE FACTORS FREQUENCY  PT (By licensed PT), OT (By licensed OT)     PT Frequency: 5x/week OT Frequency: 5x/week            Contractures      Additional Factors Info  Code Status, Allergies Code Status Info: Full Allergies Info: NKA            Current Medications (08/01/2023):  This is the current hospital active medication list Current Facility-Administered Medications  Medication Dose Route Frequency Provider Last Rate Last Admin   acetaminophen (TYLENOL) tablet 650 mg  650 mg Oral Q6H PRN Morene Crocker, MD   650 mg at 07/31/23 0135   Or   acetaminophen (TYLENOL) suppository 650 mg  650 mg Rectal Q6H PRN Morene Crocker, MD       busPIRone (BUSPAR) tablet 10 mg  10 mg Oral BID Gwenevere Abbot, MD   10 mg at 08/01/23 0847   famotidine (PEPCID) tablet 20 mg  20 mg Oral BID Morene Crocker, MD   20 mg at 08/01/23 0849   feeding supplement (ENSURE ENLIVE / ENSURE PLUS) liquid 237 mL  237 mL Oral TID WC Heide Spark, Nischal, MD   237 mL at 08/01/23 1226   ferrous sulfate tablet 325 mg  325 mg Oral Daily Morene Crocker, MD   325 mg at 08/01/23 0851   folic acid (FOLVITE) tablet 1 mg  1 mg Oral Daily Morene Crocker, MD   1 mg at 08/01/23 0849   Gerhardt's butt cream   Topical BID Earl Lagos, MD   Given at 08/01/23 0900   insulin aspart (novoLOG) injection 0-5 Units  0-5 Units Subcutaneous QHS Morene Crocker, MD       insulin aspart (novoLOG) injection 0-9 Units  0-9 Units Subcutaneous TID WC Morene Crocker, MD   2 Units at 08/01/23 0850   levETIRAcetam (KEPPRA) tablet 500 mg  500 mg Oral BID Morene Crocker, MD   500 mg at 08/01/23 0846   lidocaine (LIDODERM) 5 % 1 patch  1 patch Transdermal Q24H Morene Crocker, MD   1 patch at 07/30/23 1924   lidocaine (LIDODERM) 5 % 2 patch  2 patch Transdermal Q24H Morene Crocker, MD       lipase/protease/amylase (CREON) capsule 36,000 Units  36,000 Units Oral TID WC Gwenevere Abbot, MD   36,000 Units at 08/01/23 1226   metoprolol tartrate (LOPRESSOR) tablet 100 mg  100 mg Oral Daily Morene Crocker, MD   100 mg at 08/01/23 0847   multivitamin with minerals tablet 1 tablet  1 tablet Oral Daily Earl Lagos, MD    1 tablet at 08/01/23 0846   mupirocin ointment (BACTROBAN) 2 %   Topical BID Earl Lagos, MD   Given at 08/01/23 0900   nicotine (NICODERM CQ - dosed in mg/24 hours) patch 14 mg  14 mg Transdermal Daily Morene Crocker, MD   14 mg at 08/01/23 0856   oxyCODONE (Oxy IR/ROXICODONE) immediate release tablet 5 mg  5 mg Oral Q6H PRN Gwenevere Abbot, MD   5 mg at 07/31/23 2156   rosuvastatin (CRESTOR) tablet 5 mg  5 mg Oral QHS Morene Crocker, MD   5 mg at 07/31/23 2156   senna-docusate (Senokot-S) tablet 1 tablet  1 tablet Oral BID Gomez-Caraballo,  Byrd Hesselbach, MD   1 tablet at 08/01/23 0858   sodium chloride flush (NS) 0.9 % injection 3 mL  3 mL Intravenous Q12H Morene Crocker, MD   3 mL at 08/01/23 0857   tamsulosin (FLOMAX) capsule 0.4 mg  0.4 mg Oral QPC supper Morene Crocker, MD   0.4 mg at 07/31/23 1711   traZODone (DESYREL) tablet 150 mg  150 mg Oral QHS Morene Crocker, MD   150 mg at 07/31/23 2156     Discharge Medications: Please see discharge summary for a list of discharge medications.  Relevant Imaging Results:  Relevant Lab Results:   Additional Information SSN [id number]  Antion Felipa Emory, Student-Social Work

## 2023-08-01 NOTE — Progress Notes (Signed)
HD#0 SUBJECTIVE:  Patient Summary: Brandon [name] is a 56 y.o. with a PMH significant for CAD, prior CVA on plavix and ASA with residual deficits of intention tremor and trouble with coordination on the right, recurrent falls, T2DM, HLD who presented after a fall from [facility].   Overnight Events: Refused baclofen  Interim History: Feeling bad still but not worse than yesterday. Would like to watch TV without interruption.   OBJECTIVE:  Vital Signs: Vitals:   07/31/23 1604 08/01/23 0017 08/01/23 0408 08/01/23 0807  BP:  126/80 136/85 108/75  Pulse: 70 73 84 69  Resp: 18 18 18 16   Temp: 98 F (36.7 C) (!) 97.4 F (36.3 C) (!) 97.3 F (36.3 C) 98.3 F (36.8 C)  TempSrc: Oral Oral Oral   SpO2: 98% 98% 99% 98%  Weight:      Height:       Supplemental O2: Room Air SpO2: 98 %  Filed Weights   07/30/23 1945 07/31/23 0500  Weight: 90.7 kg 72.7 kg     Intake/Output Summary (Last 24 hours) at 08/01/2023 1747 Last data filed at 08/01/2023 1200 Gross per 24 hour  Intake --  Output 900 ml  Net -900 ml   Net IO Since Admission: -840 mL [08/01/23 1747]  Physical Exam Constitutional:      General: He is not in acute distress.    Appearance: He is not ill-appearing or toxic-appearing.  HENT:     Mouth/Throat:     Mouth: Mucous membranes are moist.  Cardiovascular:     Rate and Rhythm: Normal rate and regular rhythm.     Heart sounds: No murmur heard.    No friction rub. No gallop.  Pulmonary:     Effort: Pulmonary effort is normal. No respiratory distress.  Abdominal:     General: There is no distension.     Tenderness: There is no abdominal tenderness. There is no guarding or rebound.  Musculoskeletal:     Right lower leg: No edema.     Left lower leg: No edema.  Skin:    Comments: Abrasions over the left temple, hand and shoulder.  Neurological:     Mental Status: He is alert and oriented to person, place, and time.   Patient Lines/Drains/Airways  Status     Active Line/Drains/Airways     Name Placement date Placement time Site Days   Peripheral IV 07/30/23 20 G Left Forearm 07/30/23  1428  Forearm  2   External Urinary Catheter 01/14/22  0405  --  564   External Urinary Catheter 07/31/23  1300  --  1   Pressure Injury 01/07/22 Coccyx Medial Stage 2 -  Partial thickness loss of dermis presenting as a shallow open injury with a red, pink wound bed without slough. discolored 01/07/22  2045  -- 571   Pressure Injury 01/14/22 Shoulder Left;Posterior Stage 2 -  Partial thickness loss of dermis presenting as a shallow open injury with a red, pink wound bed without slough. 01/14/22  1800  -- 564   Pressure Injury 01/14/22 Hip Lateral;Right Stage 2 -  Partial thickness loss of dermis presenting as a shallow open injury with a red, pink wound bed without slough. 01/14/22  1800  -- 564   Pressure Injury 01/14/22 Knee Lateral;Right Unstageable - Full thickness tissue loss in which the base of the injury is covered by slough (yellow, tan, gray, green or brown) and/or eschar (tan, brown or black) in the wound bed. 01/14/22  1800  -- 564   Wound / Incision (Open or Dehisced) 07/31/23 Other (Comment) Shoulder Left 07/31/23  0150  Shoulder  1   Wound / Incision (Open or Dehisced) 07/31/23 Laceration Face Left;Upper 07/31/23  0151  Face  1   Wound / Incision (Open or Dehisced) 07/31/23 Skin tear Hand Anterior;Right;Left abrasion 07/31/23  0152  Hand  1             ASSESSMENT/PLAN:  Assessment: Principal Problem:   ICH (intracerebral hemorrhage) (HCC) Active Problems:   Fall   Plan:  #Parenchymal Hemorrhage #Subarachnoid Hemorrhage No neurological changes since yesterday. Stable. Pt refusing to go back to [place], appreciate TOC.    -Cw Keppra 500mg  BID until 11/13 -hold plavix and ASA until 11/20 -PT/OT   Left Shoulder Pain  Ortho recommended a sling for comfort but can ROM and WB as tolerated. F/u with ortho in 1 month if still  symptomatic.     -can use Tylenol 1000 mg Q6 PRN -Lidocaine patch 5% -Oxycodone 5 mg Q6 PRN for pain -Sling for comfort, can WB and ROM as tolerated  -FU with ortho if symptoms are still persistent    DM Fasting glucose is 169 today. Likely due to decreased PO intake. He will remain on SSI today. At home he is on Lantus 27 units at bedtime, Humalog SSI, Metformin 1000 mg BID and Trulicity 1.5 mg every Tuesday.  -cw SSI    Pancreatic Insufficiency RD was consulted. Recommended Creon 36,000 units TID  -cw same   Severe malnutrition  RD consulted, appreciate recs.  MV with minerals  Ensure Enlive and increase to TID with meals, each supplement provides 350 kcal and 20 grams of protein. Pt prefers strawberry.  -cw above   Anemia Patient Hgb 11.2 on admission. Hgb 11.4 today. -Continue Ferrous sulfate 325 mg -Continue Folic acid 1 mg  -Continue to monitor CBC   BPH -Continue Flomax 0.4 mg daily   Chronic Conditions: HTN HLD -Continue Lopressor 100 mg -Continue Crestor 5 mg   GERD -Continue Pepcid 20 mg   GAD -Continue Buspirone 10 mg BID -Trazodone 150 mg daily at bedtime   Hx of Stroke CAD s/p DES -Holding home Plavix for 2 weeks -Holding home ASA 81 for 2 weeks   Constipation  -On Senna S BID today as he had not had any BM for a few days.   Diet: Diabetic VTE Prophylaxis: SCDs Code: Full  Signature: Lashon Hillier [name],MD  Internal Medicine Resident, PGY-1 [facility]  Pager: [phone] 5:47 PM, 08/01/2023   Please contact the on call pager after 5 pm and on weekends at [phone].

## 2023-08-01 NOTE — Plan of Care (Signed)

## 2023-08-01 NOTE — TOC Progression Note (Signed)
Transition of Care [facility]) - Progression Note    Patient Details  Name: Brandon [name] MRN: [id number] Date of Birth: 1966-03-04  [facility] of [facility]) CM/SW Contact  Baldemar Lenis, [place] Phone Number: 08/01/2023, 4:03 PM  Clinical Narrative:   When [facility] arrived to transport the patient, he refused to go with them. Patient says that he has a girlfriend, [name] [phone]) who will take him home and care for him. CSW attempted to reach [name] multiple times, no answer and unable to leave a voicemail. CSW sent a text message asking for a call back. Patient says if [name] can't, then he has his sister. CSW attempted to reach patient's sister, [name], and left a voicemail requesting a call back. Patient adamantly refusing to go with [facility] back to SNF, PTAR unable to force patient on the stretcher since he is oriented. CSW discussed with patient that we will try to locate somewhere else for him to go, but that may not work out and he will not have another choice but to return to SNF. CSW updated MD about patient refusing to discharge. CSW updated SNF, per SNF report the patient does not get visitors or have much family support, so they do not believe that anyone will take him. CSW to follow.    Expected Discharge Plan: Skilled Nursing Facility Barriers to Discharge: Other (must enter comment)  Expected Discharge Plan and Services     Post Acute Care Choice: Skilled Nursing Facility Living arrangements for the past 2 months: Skilled Nursing Facility Expected Discharge Date: 08/01/23                                     Social Determinants of Health (SDOH) Interventions SDOH Screenings   Food Insecurity: Food Insecurity Present (07/30/2023)  Housing: High Risk (07/30/2023)  Transportation Needs: Unmet Transportation Needs (07/30/2023)  Utilities: At Risk (07/30/2023)  Physical Activity: Inactive (11/15/2021)  Stress: No Stress Concern Present (10/31/2021)  Tobacco Use: High Risk  (07/31/2023)    Readmission Risk Interventions     No data to display

## 2023-08-01 NOTE — TOC Transition Note (Addendum)
Transition of Care [facility]) - CM/SW Discharge Note   Patient Details  Name: Brandon [name] MRN: [id number] Date of Birth: Feb 12, 1966  Transition of [name]) CM/SW Contact:  Antion Felipa Emory, Student-Social Work Phone Number: 08/01/2023, 1:46 PM   Clinical Narrative:    MSW Student confirmed with MD that patient is stable for discharge. MSW Student notified sister [name] and left a voicemail to update about discharge. MSW Student confirmed bed is available at [facility]. Transport arranged with [facility] for next available.   Number to call report:  [phone] Rm: [id number]   UPDATE: CSW met with patient per patient request as he said he didn't want to return to his facility. CSW explained that LTC transfers do not happen from the hospital and he'll have to work on that at his facility. Patient said he'd like [facility], and CSW explained that they have a waitlist for their LTC beds. Patient indicated understanding. CSW relayed message to [facility] Admissions about patient's request for a transfer. No further TOC needs.    Final next level of care: Skilled Nursing Facility Barriers to Discharge: Barriers Resolved   Patient Goals and CMS Choice CMS Medicare.gov Compare Post Acute Care list provided to:: Patient Choice offered to / list presented to : Patient  Discharge Placement                Patient chooses bed at:  [facility]) Patient to be transferred to facility by: [facility] Name of family member notified: [name] Patient and family notified of of transfer: 08/01/23  Discharge Plan and Services Additional resources added to the After Visit Summary for       Post Acute Care Choice: Skilled Nursing Facility                               Social Determinants of Health (SDOH) Interventions SDOH Screenings   Food Insecurity: Food Insecurity Present (07/30/2023)  Housing: High Risk (07/30/2023)  Transportation Needs: Unmet Transportation Needs (07/30/2023)  Utilities: At  Risk (07/30/2023)  Physical Activity: Inactive (11/15/2021)  Stress: No Stress Concern Present (10/31/2021)  Tobacco Use: High Risk (07/31/2023)     Readmission Risk Interventions     No data to display

## 2023-08-01 NOTE — Progress Notes (Signed)
    Providing Compassionate, Quality Care - Together   NEUROSURGERY PROGRESS NOTE     S: No issues overnight.    O: EXAM:  BP 108/75 (BP Location: Right Arm)   Pulse 69   Temp 98.3 F (36.8 C)   Resp 16   Ht 6\' 1"  (1.854 m)   Wt 72.7 kg   SpO2 98%   BMI 21.15 kg/m     Awake, alert, oriented x3 Speech fluent, appropriate  CNs grossly intact  MAEs   ASSESSMENT:  57 y.o. with a history of R sided CVA with acute traumatic ICH, mixed frontal SAH and intraparenchymal after a fall on Plavix.      PLAN: -Continue supportive care per primary team. -Hold Plavix at least two weeks s/p fall pending neurologic status.  -Call w/ questions/concerns.   Brandon [name], [facility]

## 2023-08-01 NOTE — Progress Notes (Signed)
Orthopedic Tech Progress Note Patient Details:  Brandon [name] [birth year] [id number]  Ortho Devices Type of Ortho Device: Shoulder immobilizer Ortho Device/Splint Location: LUE Ortho Device/Splint Interventions: Ordered, Other (comment)  Left at bedside    Tonye Pearson 08/01/2023, 11:57 AM

## 2023-08-01 NOTE — Discharge Instructions (Addendum)
Brandon [name], Thank you for allowing [place] to take care of you today. You were here because you had a fall and a brain bleed.   He is to remain off plavix and aspirin until 11/20 He is to take Keppra 500mg  twice daily until 11/13  For his left shoulder:  Ortho recommended a sling for comfort but can have regular range of motion or be weight bearing if he can tolerate it.    -can use Tylenol 1000 mg Q6 PRN -Lidocaine patch 5% daily -Oxycodone 5 mg Q6 PRN for pain for 5 days -FU with ortho if symptoms are still persistent in a month   A nutrition consult was placed. He was found to be on severe malnutrition.  -He is to take a daily multivitamin with minerals  -Ensure Enlive and increase to TID with meals, each supplement provides 350 kcal and 20 grams of protein. Pt prefers strawberry.   For his Pancreatic Insufficiency it is recommended that he takes Creon 36,000 units TID   You will be discharge to [facility] where you have been residing for the past months. We have communicated with them to continue your care. Take care of you, Drs. [name] and [employer]

## 2023-08-01 NOTE — Discharge Summary (Addendum)
Name: HEITH PALOS MRN: [id number] DOB: Feb 03, 1966 57 y.o. PCP: Armando Gang, FNP  Date of Admission: 07/30/2023  2:22 PM Date of Discharge:  08/01/2023 Attending Physician: Dr. Heide Spark  DISCHARGE DIAGNOSIS:  Primary Problem: ICH (intracerebral hemorrhage) [facility])   Hospital Problems: Principal Problem:   ICH (intracerebral hemorrhage) (HCC) Active Problems:   Fall    DISCHARGE MEDICATIONS:   Allergies as of 08/01/2023   No Known Allergies      Medication List     STOP taking these medications    Aspirin Low Dose 81 MG tablet Generic drug: aspirin EC   clopidogrel 75 MG tablet Commonly known as: PLAVIX       TAKE these medications    ascorbic acid 500 MG tablet Commonly known as: VITAMIN C Take 500 mg by mouth daily.   busPIRone 10 MG tablet Commonly known as: BUSPAR Take 1 tablet (10 mg total) by mouth 2 (two) times daily.   famotidine 20 MG tablet Commonly known as: PEPCID Take 1 tablet (20 mg total) by mouth 2 (two) times daily.   feeding supplement Liqd Take 237 mLs by mouth 3 (three) times daily with meals. What changed: when to take this   FeroSul 325 (65 Fe) MG tablet Generic drug: ferrous sulfate Take 1 tablet (325 mg total) by mouth daily.   folic acid 1 MG tablet Commonly known as: FOLVITE Take 1 tablet (1 mg total) by mouth daily.   Gerhardt's butt cream Crea Apply 1 Application topically 2 (two) times daily.   HM LIDOCAINE PATCH EX Apply 1 patch topically daily. Apply to left shoulder for pain - 5% patch   insulin glargine 100 UNIT/ML Solostar Pen Commonly known as: LANTUS Inject 27 Units into the skin at bedtime.   insulin lispro 100 UNIT/ML injection Commonly known as: HUMALOG Inject 2-10 Units into the skin 3 (three) times daily before meals. Per sliding scale: 70-130= 0 units, 131-180= 2 units, 181-240= 4 units, 241-300= 6 units, 301-350= 8 units, 351-400= 10 units   levETIRAcetam 500 MG tablet Commonly known as:  KEPPRA Take 1 tablet (500 mg total) by mouth 2 (two) times daily.   lipase/protease/amylase 40981 UNITS Cpep capsule Commonly known as: CREON Take 1 capsule (36,000 Units total) by mouth 3 (three) times daily with meals. What changed: when to take this   metFORMIN 1000 MG tablet Commonly known as: GLUCOPHAGE Take 1 tablet (1,000 mg total) by mouth 2 (two) times daily with a meal.   metoprolol tartrate 100 MG tablet Commonly known as: LOPRESSOR Take 100 mg by mouth daily.   multivitamin with minerals Tabs tablet Take 1 tablet by mouth daily.   mupirocin ointment 2 % Commonly known as: BACTROBAN Apply topically 2 (two) times daily.   nicotine 14 mg/24hr patch Commonly known as: NICODERM CQ - dosed in mg/24 hours Place 1 patch (14 mg total) onto the skin daily. Start taking on: August 02, 2023   oxyCODONE 5 MG immediate release tablet Commonly known as: Oxy IR/ROXICODONE Take 1 tablet (5 mg total) by mouth every 6 (six) hours as needed for up to 5 days for moderate pain (pain score 4-6) or severe pain (pain score 7-10).   rosuvastatin 5 MG tablet Commonly known as: CRESTOR Take 5 mg by mouth at bedtime.   senna-docusate 8.6-50 MG tablet Commonly known as: Senokot-S Take 1 tablet by mouth 2 (two) times daily.   tamsulosin 0.4 MG Caps capsule Commonly known as: FLOMAX Take 1 capsule (0.4 mg  total) by mouth daily after supper. What changed: when to take this   traZODone 150 MG tablet Commonly known as: DESYREL Take 1 tablet (150 mg total) by mouth at bedtime.               Discharge Care Instructions  (From admission, onward)           Start     Ordered   08/01/23 0000  Discharge wound care:       Comments: 09/29/22 1000    Wound care  2 times daily      Comments: 1.        Clean abrasions to L temple/face/ear with NS, apply Mupirocin 2 times daily. May cover with Telfa nonstick pad and tape or may leave open to air if unable to get any dressing to stay  on.   2.Clean penis/scrotum/inner thighs with soap and water, dry and apply Gerhardt's Butt Cream to area 2 times a day and prn soiling. May sprinkle over Gerhardt's to scrotum and thighs with floor stock antifungal powder (green label Microguard) for extra drying effect.  07/31/23 0849    07/31/23 0848    Wound care  Daily      Comments: Clean abrasions to L shoulder and B hands with NS, apply Vaseline gauze [place] #239) cut to fit wound beds daily, cover with Telfa nonstick dressing and silicone foam to secure  07/31/23 0847      Laceration Repair (From admission, onward)      Start     Ordered   07/31/23 1147    .Marland KitchenLaceration Repair  Once      Comments: This order was created via procedure documentation  07/31/23 1146   08/01/23 1331            DISPOSITION AND FOLLOW-UP:  Mr.Winfrey W Mica was discharged from [facility] in Stable condition. At the hospital follow up visit please address:  Follow-up Recommendations: Consults: Orthopedic Surgery Labs: Basic Metabolic Profile and CBC Medications: Keppra for 7 days. Holding plavix and aspirin for 2 weeks. Ok to resume asa and plavix on 11/20 . Consider transitioning to only 1 antiplatelet agent instead of 2 if medically appropriate.  Follow-up Appointments:  Discharged to SNF   HOSPITAL COURSE:  Patient Summary: RESHAD PERKOWSKI is a 57 y.o. with a PMH significant for CAD, prior CVA on plavix and ASA with residual deficits of intention tremor and trouble with coordination on the right, recurrent falls, T2DM, HLD who presented after a fall from [facility].   #Parenchymal Hemorrhage #Subarachnoid Hemorrhage  CT Head in the ED was concerning for parenchymal and subarachnoid hemorrhage without significant mass effect or midline shift. No acute fractures noted. Neurosurgery was consulted iand recommended repeat CT Head, and BID Keppra for 7 days for seizure prophylaxis. Repeat CT showed a slight  increase in the size of the intraparenchymal bleed/hemorrhagic contusion of the medial left frontal lobe (32mm-13mm). The hemorrhagic foci in the frontal lobes bilaterally were unchanged from prior CT.   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 Has been getting lidocaine patches at SNF. Ortho recommended a sling for comfort but can ROM and WB as tolerated. F/u with ortho in 1 month if still symptomatic.    -can use Tylenol 1000 mg Q6 PRN -Lidocaine patch 5% -Oxycodone 5  mg Q6 PRN for pain -Sling for comfort, can WB and ROM as tolerated  -FU with ortho if symptoms are still persistent   DM Fasting glucose is 169 today. Likely due to decreased PO intake. He will remain on SSI today. At home he is on Lantus 27 units at bedtime, Humalog SSI, Metformin 1000 mg BID and Trulicity 1.5 mg every Tuesday.   Pancreatic Insufficiency RD was consulted. Recommended Creon 36,000 units TID  -cw same  Severe malnutrition  RD consulted, appreciate recs.  MV with minerals  Ensure Enlive and increase to TID with meals, each supplement provides 350 kcal and 20 grams of protein. Pt prefers strawberry.   Anemia Patient Hgb 11.2 on admission. Hgb 11.4 today. -Continue Ferrous sulfate 325 mg -Continue Folic acid 1 mg  -Continue to monitor CBC  BPH -Continue Flomax 0.4 mg daily  Chronic Conditions: HTN HLD -Continue Lopressor 100 mg -Continue Crestor 5 mg   GERD -Continue Pepcid 20 mg   GAD -Continue Buspirone 10 mg BID -Trazodone 150 mg daily at bedtime   Hx of Stroke CAD s/p DES -Holding home Plavix for 2 weeks -Holding home ASA 81 for 2 weeks   Constipation  -On Senna S BID today as he had not had any BM for a few days.        DISCHARGE INSTRUCTIONS:   Discharge Instructions      Call MD for:   Complete by: As directed    If neurological changes, patient will need to be seen in the emergency department for repeat CT head and neurosurgery consult   Call MD for:  difficulty breathing, headache or visual disturbances   Complete by: As directed    Call MD for:  extreme fatigue   Complete by: As directed    Call MD for:  hives   Complete by: As directed    Call MD for:  persistant dizziness or light-headedness   Complete by: As directed    Call MD for:  persistant nausea and vomiting   Complete by: As directed    Call MD for:  redness, tenderness, or signs of infection (pain, swelling, redness, odor or green/yellow discharge around incision site)   Complete by: As directed    Call MD for:  severe uncontrolled pain   Complete by: As directed    Call MD for:  temperature >100.4   Complete by: As directed    Diet - low sodium heart healthy   Complete by: As directed    Discharge instructions   Complete by: As directed    Mr. Swingler, Thank you for allowing [place] to take care of you today. You were here because you had a fall and a brain bleed.   He is to remain off plavix and aspirin until 11/20 He is to take Keppra 500mg  twice daily until 11/13  For his left shoulder:  Ortho recommended a sling for comfort but can have regular range of motion or be weight bearing if he can tolerate it.    -can use Tylenol 1000 mg Q6 PRN -Lidocaine patch 5% daily -Oxycodone 5 mg Q6 PRN for pain for 5 days -FU with ortho if symptoms are still persistent in a month   A nutrition consult was placed. He was found to be on severe malnutrition.  -He is to take a daily multivitamin with minerals  -Ensure Enlive and increase to TID with meals, each supplement provides 350 kcal and 20 grams of protein. Pt prefers strawberry.  For his Pancreatic Insufficiency it is recommended that he takes Creon 36,000 units TID   You will be discharge to [facility] where you have been residing  for the past months. We have communicated with them to continue your care. Take care of you, Drs. [name] and Daiva Eves   Discharge wound care:   Complete by: As directed    09/29/22 1000    Wound care  2 times daily      Comments: 1.        Clean abrasions to L temple/face/ear with NS, apply Mupirocin 2 times daily. May cover with Telfa nonstick pad and tape or may leave open to air if unable to get any dressing to stay on.   2.Clean penis/scrotum/inner thighs with soap and water, dry and apply Gerhardt's Butt Cream to area 2 times a day and prn soiling. May sprinkle over Gerhardt's to scrotum and thighs with floor stock antifungal powder (green label Microguard) for extra drying effect.  07/31/23 0849    07/31/23 0848    Wound care  Daily      Comments: Clean abrasions to L shoulder and B hands with NS, apply Vaseline gauze [place] #239) cut to fit wound beds daily, cover with Telfa nonstick dressing and silicone foam to secure  07/31/23 0847      Laceration Repair (From admission, onward)      Start     Ordered   07/31/23 1147    .Marland KitchenLaceration Repair  Once      Comments: This order was created via procedure documentation  07/31/23 1146   Increase activity slowly   Complete by: As directed        SUBJECTIVE:   Feeling bad still but not worse than yesterday. Would like to watch TV without interruption.   Discharge Vitals:   BP 108/75 (BP Location: Right Arm)   Pulse 69   Temp 98.3 F (36.8 C)   Resp 16   Ht 6\' 1"  (1.854 m)   Wt 72.7 kg   SpO2 98%   BMI 21.15 kg/m   OBJECTIVE:  Physical Exam Constitutional:      General: He is not in acute distress.    Appearance: He is not ill-appearing or toxic-appearing.  HENT:     Mouth/Throat:     Mouth: Mucous membranes are moist.  Cardiovascular:     Rate and Rhythm: Normal rate and regular rhythm.     Heart sounds: No murmur heard.    No friction rub. No gallop.  Pulmonary:     Effort: Pulmonary effort  is normal. No respiratory distress.  Abdominal:     General: There is no distension.     Tenderness: There is no abdominal tenderness. There is no guarding or rebound.  Musculoskeletal:     Right lower leg: No edema.     Left lower leg: No edema.  Skin:    Comments: Abrasions over the left temple, hand and shoulder.  Neurological:     Mental Status: He is alert and oriented to person, place, and time.     Pertinent Labs, Studies, and Procedures:     Latest Ref Rng & Units 08/01/2023    8:47 AM 08/01/2023    7:53 AM 07/31/2023    6:32 AM  CBC  WBC 4.0 - 10.5 K/uL 8.1  7.7  8.8   Hemoglobin 13.0 - 17.0 g/dL 29.5  62.1  30.8   Hematocrit 39.0 - 52.0 % 34.5  33.7  32.7  Platelets 150 - 400 K/uL 257  256  273        Latest Ref Rng & Units 07/31/2023    6:32 AM 07/30/2023    2:24 PM 01/16/2022    3:19 AM  CMP  Glucose 70 - 99 mg/dL 161  096  88   BUN 6 - 20 mg/dL 9  11  10    Creatinine 0.61 - 1.24 mg/dL 0.45  4.09  8.11   Sodium 135 - 145 mmol/L 136  139  135   Potassium 3.5 - 5.1 mmol/L 3.7  4.4  3.7   Chloride 98 - 111 mmol/L 102  102  103   CO2 22 - 32 mmol/L 23  25  28    Calcium 8.9 - 10.3 mg/dL 9.1  9.4  8.2   Total Protein 6.5 - 8.1 g/dL 6.1  6.6    Total Bilirubin <1.2 mg/dL 0.3  0.6    Alkaline Phos 38 - 126 U/L 222  252    AST 15 - 41 U/L 15  15    ALT 0 - 44 U/L 17  16      CT Head Wo Contrast  Addendum Date: 08/01/2023   ADDENDUM REPORT: 08/01/2023 12:24 ADDENDUM: Please note the ventricular dilatation is similar to the prior CTs dating back to at least 2023 and likely related to general volume loss. Normal pressure hydrocephalus is not excluded. Clinical correlation is recommended. Electronically Signed   By: Elgie Collard M.D.   On: 08/01/2023 12:24   Result Date: 08/01/2023 CLINICAL DATA:  Head trauma.  Follow-up intracranial bleed. EXAM: CT HEAD WITHOUT CONTRAST TECHNIQUE: Contiguous axial images were obtained from the base of the skull through the vertex  without intravenous contrast. RADIATION DOSE REDUCTION: This exam was performed according to the departmental dose-optimization program which includes automated exposure control, adjustment of the mA and/or kV according to patient size and/or use of iterative reconstruction technique. COMPARISON:  Earlier CT dated 07/30/2023. FINDINGS: Brain: Slight interval increase in the size of intraparenchymal bleed/hemorrhagic contusion of the medial left frontal lobe (15/2) measuring 13 mm in diameter (previously approximately 7 mm). Additional hemorrhagic foci in the frontal lobes bilaterally are relatively similar to prior CT. There is moderate age-related atrophy and chronic microvascular ischemic changes. Old right frontal infarct and encephalomalacia. No mass effect or midline shift. No extra-axial fluid collection. Vascular: No hyperdense vessel or unexpected calcification. Skull: Normal. Negative for fracture or focal lesion. Sinuses/Orbits: No acute finding. Other: None IMPRESSION: 1. Slight interval increase in the size of intraparenchymal bleed/hemorrhagic contusion of the medial left frontal lobe. 2. Additional hemorrhagic foci in the frontal lobes bilaterally are relatively similar to prior CT. 3. Moderate age-related atrophy and chronic microvascular ischemic changes. Old right frontal infarct and encephalomalacia. Electronically Signed: By: Elgie Collard M.D. On: 07/30/2023 22:47   CT SHOULDER LEFT W WO CONTRAST  Result Date: 07/31/2023 CLINICAL DATA:  Chronic shoulder pain. Rotator cuff disorder suspected. Larey Seat out of wheelchair 07/30/2023. EXAM: CT OF THE UPPER LEFT EXTREMITY WITHOUT AND WITH CONTRAST TECHNIQUE: Multidetector CT imaging of the upper left extremity was performed prior to and following the standard protocol before and during bolus administration of intravenous contrast. RADIATION DOSE REDUCTION: This exam was performed according to the departmental dose-optimization program which includes  automated exposure control, adjustment of the mA and/or kV according to patient size and/or use of iterative reconstruction technique. COMPARISON:  Left shoulder radiographs 07/30/2023 CONTRAST:  75mL OMNIPAQUE IOHEXOL 350 MG/ML SOLN FINDINGS: Bones/Joint/Cartilage  There is diffuse decreased bone mineralization. There is a small curvilinear fleck of bone mineralization measuring approximately 7 x 1 mm bordering the superior anterior aspect of the humeral head deep to the anterior supraspinatus tendon insertion (sagittal series 16, coronal series 15 image 50/83). This is favored to represent an age indeterminate avulsion injury at the supraspinatus tendon footprint insertion on the humeral head. 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 The majority of the anterior supraspinatus tendon fibers remain intact. Chronic calcific tendinosis of the rotator cuff is also considered. There is mild sclerosis just deep to the humeral head cortex deep to the interdigitating posterior supraspinatus and anterior infraspinatus tendon footprint insertions, likely chronic. Moderate multilevel degenerative disc changes of the mid to upper thoracic spine. Complete anterior bridging osteophytosis at the C6-7 level. Ligaments Suboptimally assessed by CT. Muscles and Tendons Mild atrophy of the anterior supraspinatus muscle. Soft tissues There is moderate atherosclerotic calcification within the aortic arch. High-grade dense coronary artery calcifications. Mild curvilinear subsegmental atelectasis versus scarring within the lingula. IMPRESSION: As seen on prior radiographs, there is a small curvilinear fleck of bone mineralization bordering the superior anterior aspect of the humeral head deep to the anterior supraspinatus tendon insertion. This is favored to represent an  age indeterminate mild avulsion injury at the supraspinatus tendon footprint insertion on the humeral head. 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 Chronic calcific tendinosis of the rotator cuff is also considered. Aortic Atherosclerosis (ICD10-I70.0). Electronically Signed   By: Neita Garnet M.D.   On: 07/31/2023 17:04   DG Knee 1-2 Views Left  Result Date: 07/30/2023 CLINICAL DATA:  Fall.  Left knee pain. EXAM: LEFT KNEE - 1-2 VIEW COMPARISON:  None Available. FINDINGS: No acute fracture or dislocation. No aggressive osseous lesion. No significant arthritis of the knee joint. No knee effusion or focal soft tissue swelling. No radiopaque foreign bodies. IMPRESSION: No acute osseous abnormality Electronically Signed   By: Jules Schick M.D.   On: 07/30/2023 19:04   DG Shoulder Left Port  Result Date: 07/30/2023 CLINICAL DATA:  Larey Seat out of wheelchair, left shoulder injury and abrasion EXAM: LEFT SHOULDER COMPARISON:  01/14/2022 FINDINGS: Internal rotation, external rotation, transscapular views of the left shoulder are obtained. There is a small curvilinear ossific density adjacent to the greater tuberosity of the left humerus, best seen on the internal and external rotation views. This could reflect sequela from calcific tendinopathy or small avulsion fracture given history of recent trauma. Correlation with physical exam recommended. There is mild superficial soft tissue swelling overlying the humeral head and acromion process. Mild degenerative changes of the acromioclavicular and glenohumeral joints. Left chest is clear. IMPRESSION: 1. Small ossific density adjacent to the greater tuberosity of the left humerus, which could reflect a small acute fracture versus sequela of calcific rotator cuff tendinopathy. Please correlate with  physical exam findings. 2. Mild superficial soft tissue swelling overlying the left shoulder. 3. Mild acromioclavicular and glenohumeral joint osteoarthritis. Electronically Signed   By: Sharlet Salina M.D.   On: 07/30/2023 17:09   DG Pelvis Portable  Result Date: 07/30/2023 CLINICAL DATA:  Larey Seat out of wheelchair EXAM: PORTABLE PELVIS 1-2 VIEWS COMPARISON:  06/16/2012 FINDINGS: Single frontal view of the pelvis includes both  hips. No acute fracture, subluxation, or dislocation. Mild bilateral hip osteoarthritis. Prominent spondylosis and facet hypertrophy at the lumbosacral junction. Sacroiliac joints are unremarkable. IMPRESSION: 1. Degenerative changes of the lumbar spine and bilateral hips. 2. No acute fracture. Electronically Signed   By: Sharlet Salina M.D.   On: 07/30/2023 17:07   DG CHEST PORT 1 VIEW  Result Date: 07/30/2023 CLINICAL DATA:  Larey Seat out of wheelchair, hit head EXAM: PORTABLE CHEST 1 VIEW COMPARISON:  01/14/2022 FINDINGS: Single frontal view of the chest demonstrates an unremarkable cardiac silhouette. No acute airspace disease, effusion, or pneumothorax. There are no acute displaced fractures. IMPRESSION: 1. No acute intrathoracic process. Electronically Signed   By: Sharlet Salina M.D.   On: 07/30/2023 17:06   CT Head Wo Contrast  Result Date: 07/30/2023 CLINICAL DATA:  Fall on Plavix EXAM: CT HEAD WITHOUT CONTRAST CT CERVICAL SPINE WITHOUT CONTRAST TECHNIQUE: Multidetector CT imaging of the head and cervical spine was performed following the standard protocol without intravenous contrast. Multiplanar CT image reconstructions of the cervical spine were also generated. RADIATION DOSE REDUCTION: This exam was performed according to the departmental dose-optimization program which includes automated exposure control, adjustment of the mA and/or kV according to patient size and/or use of iterative reconstruction technique. COMPARISON:  01/07/2022 CT head and cervical spine FINDINGS: CT  HEAD FINDINGS Brain: Multiple hyperdense foci in the medial frontal lobes bilaterally (series 3, images 15-20 and 25-27), most likely parenchymal and subarachnoid hemorrhage. No significant subdural or epidural collection. Redemonstrated global parenchymal volume loss with prominence the ventricles and extra-axial spaces, which appear advanced for age but unchanged from the prior exam. Encephalomalacia and gliosis in right frontal lobe is again noted. Additional remote infarct in the right caudate head. No evidence of acute infarct, mass, mass effect, or midline shift. No hydrocephalus. Vascular: No hyperdense vessel. Skull: Negative for fracture or focal lesion. Sinuses/Orbits: No acute finding. Other: The mastoid air cells are well aerated. CT CERVICAL SPINE FINDINGS Alignment: No traumatic listhesis. Trace retrolisthesis of C3 on C4, C4 on C5, and C5 on C6, which appears facet mediated. Levocurvature is likely positional. Skull base and vertebrae: No acute fracture or suspicious osseous lesion. Unchanged compression deformity of T1. Soft tissues and spinal canal: No prevertebral fluid or swelling. No visible canal hematoma. Disc levels: Degenerative changes in the cervical spine.Moderate to severe spinal canal stenosis at C3-C4 and C4-C5. Upper chest: No focal pulmonary opacity or pleural effusion. IMPRESSION: 1. Multiple hyperdense foci in the medial frontal lobes bilaterally, most likely parenchymal and subarachnoid hemorrhage. No significant subdural or epidural collection. No significant mass effect or midline shift. 2. No acute fracture or traumatic listhesis in the cervical spine. These results were called by telephone at the time of interpretation on 07/30/2023 at 3:05 pm to provider [facility] , who verbally acknowledged these results. Electronically Signed   By: Wiliam Ke M.D.   On: 07/30/2023 15:05   CT Cervical Spine Wo Contrast  Result Date: 07/30/2023 CLINICAL DATA:  Fall on Plavix EXAM:  CT HEAD WITHOUT CONTRAST CT CERVICAL SPINE WITHOUT CONTRAST TECHNIQUE: Multidetector CT imaging of the head and cervical spine was performed following the standard protocol without intravenous contrast. Multiplanar CT image reconstructions of the cervical spine were also generated. RADIATION DOSE REDUCTION: This exam was performed according to the departmental dose-optimization program which includes automated exposure control, adjustment of the mA and/or kV according to patient size and/or use of iterative reconstruction technique. COMPARISON:  01/07/2022 CT head and cervical spine  FINDINGS: CT HEAD FINDINGS Brain: Multiple hyperdense foci in the medial frontal lobes bilaterally (series 3, images 15-20 and 25-27), most likely parenchymal and subarachnoid hemorrhage. No significant subdural or epidural collection. Redemonstrated global parenchymal volume loss with prominence the ventricles and extra-axial spaces, which appear advanced for age but unchanged from the prior exam. Encephalomalacia and gliosis in right frontal lobe is again noted. Additional remote infarct in the right caudate head. No evidence of acute infarct, mass, mass effect, or midline shift. No hydrocephalus. Vascular: No hyperdense vessel. Skull: Negative for fracture or focal lesion. Sinuses/Orbits: No acute finding. Other: The mastoid air cells are well aerated. CT CERVICAL SPINE FINDINGS Alignment: No traumatic listhesis. Trace retrolisthesis of C3 on C4, C4 on C5, and C5 on C6, which appears facet mediated. Levocurvature is likely positional. Skull base and vertebrae: No acute fracture or suspicious osseous lesion. Unchanged compression deformity of T1. Soft tissues and spinal canal: No prevertebral fluid or swelling. No visible canal hematoma. Disc levels: Degenerative changes in the cervical spine.Moderate to severe spinal canal stenosis at C3-C4 and C4-C5. Upper chest: No focal pulmonary opacity or pleural effusion. IMPRESSION: 1. Multiple  hyperdense foci in the medial frontal lobes bilaterally, most likely parenchymal and subarachnoid hemorrhage. No significant subdural or epidural collection. No significant mass effect or midline shift. 2. No acute fracture or traumatic listhesis in the cervical spine. These results were called by telephone at the time of interpretation on 07/30/2023 at 3:05 pm to provider [facility] , who verbally acknowledged these results. Electronically Signed   By: Wiliam Ke M.D.   On: 07/30/2023 15:05     Signed: Manuela Neptune, MD Internal Medicine Resident, PGY-1 [facility]  Pager: [phone] 1:31 PM, 08/01/2023

## 2023-08-02 DIAGNOSIS — S06360D Traumatic hemorrhage of cerebrum, unspecified, without loss of consciousness, subsequent encounter: Secondary | ICD-10-CM | POA: Diagnosis not present

## 2023-08-02 LAB — GLUCOSE, CAPILLARY
Glucose-Capillary: 206 mg/dL — ABNORMAL HIGH (ref 70–99)
Glucose-Capillary: 316 mg/dL — ABNORMAL HIGH (ref 70–99)
Glucose-Capillary: 308 mg/dL — ABNORMAL HIGH (ref 70–99)
Glucose-Capillary: 178 mg/dL — ABNORMAL HIGH (ref 70–99)

## 2023-08-02 NOTE — Progress Notes (Signed)
HD#0 SUBJECTIVE:  Patient Summary: Brandon [name] is a 57 y.o. with a PMH significant for CAD, prior CVA on plavix and ASA with residual deficits of intention tremor and trouble with coordination on the right, recurrent falls, T2DM, HLD who presented after a fall from [facility] SNF medically stable for discharge back to SNF, now refusing to be transported to same  Overnight Events:  Refused discharge to [facility]  Interim History: Continue to endose L shoulder pain and L facial pain. Per RN in room, patient did not request PRN pain medications. Continues to refuse discharge to SNF. Requested call to sister. Informed that we have attempted calling [name] without success.  OBJECTIVE:  Vital Signs: Vitals:   08/01/23 2036 08/01/23 2352 08/02/23 0355 08/02/23 0820  BP: 129/82 134/84 (!) 134/90 99/63  Pulse: 79 87 73 75  Resp: 18 18 18 18   Temp: 97.6 F (36.4 C) 98 F (36.7 C) (!) 97.5 F (36.4 C) 97.9 F (36.6 C)  TempSrc: Oral Oral Oral Oral  SpO2: 97% 98% 98% 99%  Weight:      Height:       Supplemental O2: Room Air SpO2: 99 %  Filed Weights   07/30/23 1945 07/31/23 0500  Weight: 90.7 kg 72.7 kg     Intake/Output Summary (Last 24 hours) at 08/02/2023 1109 Last data filed at 08/01/2023 2300 Gross per 24 hour  Intake 303 ml  Output 802 ml  Net -499 ml   Net IO Since Admission: -1,039 mL [08/02/23 1109]  Physical Exam Constitutional:      General: No acute distress HENT:  Ecchymosis over the L temple with mild tenderness. Unchanged    Mouth/Throat:     Mouth: Moist mucus membranes Cardiovascular:     Rate and Rhythm: RRR    Heart sounds: No murmus    No friction rub. No gallop Pulmonary:     Effort: Speaking in full sentences Abdominal:     General: There is no distension.     Tenderness: There is no abdominal tenderness. There is no guarding or rebound.  Musculoskeletal:     Right lower leg: No edema.     Left lower leg:  No edema.  Skin: Abrasion over L shoulder covered in gauze and lidocaine patch Neurological:     Mental Status: Alert and oriented to self, person, time, and situation.   Patient Lines/Drains/Airways Status     Active Line/Drains/Airways     Name Placement date Placement time Site Days   Peripheral IV 07/30/23 20 G Left Forearm 07/30/23  1428  Forearm  2   External Urinary Catheter 01/14/22  0405  --  564   External Urinary Catheter 07/31/23  1300  --  1   Pressure Injury 01/07/22 Coccyx Medial Stage 2 -  Partial thickness loss of dermis presenting as a shallow open injury with a red, pink wound bed without slough. discolored 01/07/22  2045  -- 571   Pressure Injury 01/14/22 Shoulder Left;Posterior Stage 2 -  Partial thickness loss of dermis presenting as a shallow open injury with a red, pink wound bed without slough. 01/14/22  1800  -- 564   Pressure Injury 01/14/22 Hip Lateral;Right Stage 2 -  Partial thickness loss of dermis presenting as a shallow open injury with a red, pink wound bed without slough. 01/14/22  1800  -- 564   Pressure Injury 01/14/22 Knee Lateral;Right Unstageable - Full thickness tissue loss in which the base  of the injury is covered by slough (yellow, tan, gray, green or brown) and/or eschar (tan, brown or black) in the wound bed. 01/14/22  1800  -- 564   Wound / Incision (Open or Dehisced) 07/31/23 Other (Comment) Shoulder Left 07/31/23  0150  Shoulder  1   Wound / Incision (Open or Dehisced) 07/31/23 Laceration Face Left;Upper 07/31/23  0151  Face  1   Wound / Incision (Open or Dehisced) 07/31/23 Skin tear Hand Anterior;Right;Left abrasion 07/31/23  0152  Hand  1             ASSESSMENT/PLAN:  Assessment: Principal Problem:   ICH (intracerebral hemorrhage) (HCC) Active Problems:   Fall   Plan:  #Parenchymal Hemorrhage #Subarachnoid Hemorrhage No neurologic changes. Continues to refuse discharge to [facility], appreciate TOC.   -Cw Keppra 500mg   BID until 11/13 -hold plavix and ASA until 11/20 -PT/OT   Left Shoulder Pain Ortho recommended a sling for comfort but can ROM and WB as tolerated. Patient was not wearing it today.  F/u with ortho in 1 month if still symptomatic.    -can use Tylenol 1000 mg Q6 PRN -Lidocaine patch 5% -Oxycodone 5 mg Q6 PRN for pain -Sling for comfort, can WB and ROM as tolerated  -FU with ortho if symptoms are still persistent    DM Fasting 206 today. Endorsing decreased appetite -SSI   Pancreatic Insufficiency RD was consulted. Recommended Creon 36,000 units TID  -cw same   Severe malnutrition  RD consulted, appreciate recs.  MV with minerals  Ensure Enlive and increase to TID with meals, each supplement provides 350 kcal and 20 grams of protein. Pt prefers strawberry.  -cw above   Anemia Patient Hgb 11.2 on admission. No labs today -Continue Ferrous sulfate 325 mg -Continue Folic acid 1 mg  -Continue to monitor CBC   BPH -Continue Flomax 0.4 mg daily   Chronic Conditions: HTN HLD -Continue Lopressor 100 mg -Continue Crestor 5 mg   GERD -Continue Pepcid 20 mg   GAD -Continue Buspirone 10 mg BID -Trazodone 150 mg daily at bedtime   Hx of Stroke CAD s/p DES -Holding home Plavix for 2 weeks -Holding home ASA 81 for 2 weeks   Constipation  -On Senna S BID today as he had not had any BM for a few days.   Diet: Diabetic VTE Prophylaxis: SCDs Code: Full Family update: Contacted [name] Justice again to numbers on EMR without success  Signature: Morene Crocker, MD [facility] Internal Medicine Program - PGY-2 08/02/2023, 11:16 AM Pager# [phone]  Please contact the on call pager after 5 pm and on weekends at [phone].

## 2023-08-02 NOTE — Plan of Care (Signed)
Progressing toward goals. 

## 2023-08-02 NOTE — TOC Progression Note (Signed)
Transition of Care [facility]) - Progression Note    Patient Details  Name: Brandon [name] MRN: [id number] Date of Birth: September 01, 1966  Transition of Care [facility]) CM/SW Contact  Dellie Burns [place], [place] Phone Number: 08/02/2023, 9:53 AM  Clinical Narrative:  pt continues to refuse to return to [facility]. New SNF search started for LTC under Medicaid. Anticipate pt will be here through the weekend. Will provide updates as available.   Dellie Burns, MSW, LCSW [phone] (coverage)       Expected Discharge Plan: Skilled Nursing Facility Barriers to Discharge: Other (must enter comment)  Expected Discharge Plan and Services     Post Acute Care Choice: Skilled Nursing Facility Living arrangements for the past 2 months: Skilled Nursing Facility Expected Discharge Date: 08/01/23                                     Social Determinants of Health (SDOH) Interventions SDOH Screenings   Food Insecurity: Food Insecurity Present (07/30/2023)  Housing: High Risk (07/30/2023)  Transportation Needs: Unmet Transportation Needs (07/30/2023)  Utilities: At Risk (07/30/2023)  Physical Activity: Inactive (11/15/2021)  Stress: No Stress Concern Present (10/31/2021)  Tobacco Use: High Risk (07/31/2023)    Readmission Risk Interventions     No data to display

## 2023-08-03 DIAGNOSIS — S06360D Traumatic hemorrhage of cerebrum, unspecified, without loss of consciousness, subsequent encounter: Secondary | ICD-10-CM

## 2023-08-03 LAB — GLUCOSE, CAPILLARY
Glucose-Capillary: 205 mg/dL — ABNORMAL HIGH (ref 70–99)
Glucose-Capillary: 210 mg/dL — ABNORMAL HIGH (ref 70–99)
Glucose-Capillary: 333 mg/dL — ABNORMAL HIGH (ref 70–99)
Glucose-Capillary: 241 mg/dL — ABNORMAL HIGH (ref 70–99)
Glucose-Capillary: 265 mg/dL — ABNORMAL HIGH (ref 70–99)

## 2023-08-03 MED ORDER — INSULIN GLARGINE-YFGN 100 UNIT/ML ~~LOC~~ SOLN
20.0000 [IU] | Freq: Every day | SUBCUTANEOUS | Status: DC
  Administered 2023-08-03: 20 [IU] via SUBCUTANEOUS
  Filled 2023-08-03 (×2): qty 0.2

## 2023-08-03 NOTE — Plan of Care (Signed)

## 2023-08-03 NOTE — Plan of Care (Signed)
  Problem: Education: Goal: Knowledge of General Education information will improve Description Including pain rating scale, medication(s)/side effects and non-pharmacologic comfort measures Outcome: Progressing   

## 2023-08-03 NOTE — Progress Notes (Signed)
HD#0 SUBJECTIVE:  Patient Summary: Brandon [name] is a 57 y.o. with a PMH significant for CAD, prior CVA on plavix and ASA with residual deficits of intention tremor and trouble with coordination on the right, recurrent falls, T2DM, HLD who presented after a fall from [facility] SNF medically stable for discharge back to SNF, now refusing to be transported to same  Overnight Events:  NAEO  Interim History: Would not like to go to [place] because "it is like hell over there". Still has things at [place]. 10/10 pain today   OBJECTIVE:  Vital Signs: Vitals:   08/02/23 1136 08/02/23 2024 08/03/23 0016 08/03/23 0402  BP: 98/83 93/65 (!) 83/58 93/67  Pulse: 67 70 66 61  Resp: 17 18 19 18   Temp: 98.9 F (37.2 C) 97.7 F (36.5 C)    TempSrc: Oral Oral    SpO2: 99% 100% 98% 99%  Weight:      Height:       Supplemental O2: Room Air SpO2: 99 %  Filed Weights   07/30/23 1945 07/31/23 0500  Weight: 90.7 kg 72.7 kg    No intake or output data in the 24 hours ending 08/03/23 1029  Net IO Since Admission: -1,039 mL [08/03/23 1029]  Physical Exam Constitutional:      General: No acute distress, cachectic HENT:  Ecchymosis over the L temple    Mouth/Throat:     Mouth: Moist mucus membranes Cardiovascular:     Rate and Rhythm: RRR    Heart sounds: No murmus    No friction rub. No gallop, no friction rub Pulmonary:     Effort: Speaking in full sentences Abdominal:     General: There is no distension.     Tenderness: There is no abdominal tenderness. There is no guarding or rebound.  Musculoskeletal:     Right lower leg: No edema.     Left lower leg: No edema.  Skin: Abrasion over L shoulder covered in gauze and lidocaine patch. Tender to palpation. Neurological:     Mental Status: Alert and oriented to self, person, time, and situation.   Patient Lines/Drains/Airways Status     Active Line/Drains/Airways     Name Placement date Placement time Site Days    Peripheral IV 07/30/23 20 G Left Forearm 07/30/23  1428  Forearm  2   External Urinary Catheter 01/14/22  0405  --  564   External Urinary Catheter 07/31/23  1300  --  1   Pressure Injury 01/07/22 Coccyx Medial Stage 2 -  Partial thickness loss of dermis presenting as a shallow open injury with a red, pink wound bed without slough. discolored 01/07/22  2045  -- 571   Pressure Injury 01/14/22 Shoulder Left;Posterior Stage 2 -  Partial thickness loss of dermis presenting as a shallow open injury with a red, pink wound bed without slough. 01/14/22  1800  -- 564   Pressure Injury 01/14/22 Hip Lateral;Right Stage 2 -  Partial thickness loss of dermis presenting as a shallow open injury with a red, pink wound bed without slough. 01/14/22  1800  -- 564   Pressure Injury 01/14/22 Knee Lateral;Right Unstageable - Full thickness tissue loss in which the base of the injury is covered by slough (yellow, tan, gray, green or brown) and/or eschar (tan, brown or black) in the wound bed. 01/14/22  1800  -- 564   Wound / Incision (Open or Dehisced) 07/31/23 Other (Comment) Shoulder Left 07/31/23  0150  Shoulder  1  Wound / Incision (Open or Dehisced) 07/31/23 Laceration Face Left;Upper 07/31/23  0151  Face  1   Wound / Incision (Open or Dehisced) 07/31/23 Skin tear Hand Anterior;Right;Left abrasion 07/31/23  0152  Hand  1             ASSESSMENT/PLAN:  Assessment: Principal Problem:   ICH (intracerebral hemorrhage) (HCC) Active Problems:   Fall   Plan:  #Parenchymal Hemorrhage #Subarachnoid Hemorrhage No neurologic changes. Remains medically stable for discharge. Continues to refuse discharge to [facility], appreciate TOC.   -Cw Keppra 500mg  BID until 11/13 -hold plavix and ASA until 11/20 -PT/OT   Left Shoulder Pain Ortho recommended a sling for comfort but can ROM and WB as tolerated. No sling noted at bedside, nursing notified.   -can use Tylenol 1000 mg Q6 PRN -Lidocaine patch  5% -Oxycodone 5 mg Q6 PRN for pain -Sling for comfort, can WB and ROM as tolerated  -FU with ortho if symptoms are still persistent in 1 mo   DM Fasting 210 today. Uses 27 units of insulin at home.  -Start with 20 units long acting today -SSI   Pancreatic Insufficiency RD was consulted. Recommended Creon 36,000 units TID  -cw same   Severe malnutrition  RD consulted, appreciate recs.  MV with minerals  Ensure Enlive and increase to TID with meals, each supplement provides 350 kcal and 20 grams of protein. Pt prefers strawberry.  -cw above   Anemia Patient Hgb 11.2 on admission. No labs today -Continue Ferrous sulfate 325 mg -Continue Folic acid 1 mg  -Continue to monitor CBC   BPH -Continue Flomax 0.4 mg daily   Chronic Conditions: HTN HLD -Continue Lopressor 100 mg -Continue Crestor 5 mg   GERD -Continue Pepcid 20 mg   GAD -Continue Buspirone 10 mg BID -Trazodone 150 mg daily at bedtime   Hx of Stroke CAD s/p DES -Holding home Plavix for 2 weeks -Holding home ASA 81 for 2 weeks   Constipation  -On Senna S BID today as he had not had any BM for a few days.   Diet: Diabetic VTE Prophylaxis: SCDs Code: Full Family update: Contacted [name] Justice again to numbers on EMR without success  Signature: Morene Crocker, MD [facility] Internal Medicine Program - PGY-2 08/03/2023, 10:29 AM Pager# [phone]  Please contact the on call pager after 5 pm and on weekends at [phone].

## 2023-08-04 ENCOUNTER — Other Ambulatory Visit (HOSPITAL_COMMUNITY): Payer: Self-pay

## 2023-08-04 DIAGNOSIS — S06360A Traumatic hemorrhage of cerebrum, unspecified, without loss of consciousness, initial encounter: Secondary | ICD-10-CM | POA: Diagnosis not present

## 2023-08-04 DIAGNOSIS — W19XXXA Unspecified fall, initial encounter: Secondary | ICD-10-CM | POA: Diagnosis not present

## 2023-08-04 LAB — GLUCOSE, CAPILLARY
Glucose-Capillary: 294 mg/dL — ABNORMAL HIGH (ref 70–99)
Glucose-Capillary: 346 mg/dL — ABNORMAL HIGH (ref 70–99)

## 2023-08-04 LAB — MRSA NEXT GEN BY PCR, NASAL: MRSA by PCR Next Gen: NOT DETECTED

## 2023-08-04 MED ORDER — LEVETIRACETAM 500 MG PO TABS: 500.0000 mg | ORAL_TABLET | Freq: Two times a day (BID) | ORAL | Status: DC

## 2023-08-04 MED ORDER — ASPIRIN 81 MG PO TBEC
81.0000 mg | DELAYED_RELEASE_TABLET | Freq: Every day | ORAL | 0 refills | Status: DC
  Filled 2023-08-04: qty 30, 30d supply, fill #0

## 2023-08-04 MED ORDER — CLOPIDOGREL BISULFATE 75 MG PO TABS
75.0000 mg | ORAL_TABLET | Freq: Every day | ORAL | 0 refills | Status: DC
  Filled 2023-08-04: qty 30, 30d supply, fill #0

## 2023-08-04 MED ORDER — INSULIN GLARGINE-YFGN 100 UNIT/ML ~~LOC~~ SOLN
27.0000 [IU] | Freq: Every day | SUBCUTANEOUS | Status: DC
  Administered 2023-08-04: 27 [IU] via SUBCUTANEOUS
  Filled 2023-08-04: qty 0.27

## 2023-08-04 NOTE — TOC Progression Note (Signed)
Transition of Care [facility]) - Progression Note    Patient Details  Name: Brandon [name] MRN: [id number] Date of Birth: 03-Mar-1966  Transition of Care [facility]) CM/SW Contact  Erin Sons, [place] Phone Number: 08/04/2023, 12:31 PM  Clinical Narrative:     Per MD, pt agreeing to return to [facility]. CSW met with pt and [facility] admissions bedside. Pt is agreeable to return to [facility]. FL2 and DC summary complete and sent to [facility]. [facility] called for next available transport. RN aware. RN to call report to [phone] Rm: 111B.   Expected Discharge Plan: Skilled Nursing Facility Barriers to Discharge: Other (must enter comment)  Expected Discharge Plan and Services     Post Acute Care Choice: Skilled Nursing Facility Living arrangements for the past 2 months: Skilled Nursing Facility Expected Discharge Date: 08/04/23                                     Social Determinants of Health (SDOH) Interventions SDOH Screenings   Food Insecurity: Food Insecurity Present (07/30/2023)  Housing: High Risk (07/30/2023)  Transportation Needs: Unmet Transportation Needs (07/30/2023)  Utilities: At Risk (07/30/2023)  Physical Activity: Inactive (11/15/2021)  Stress: No Stress Concern Present (10/31/2021)  Tobacco Use: High Risk (07/31/2023)    Readmission Risk Interventions     No data to display

## 2023-08-04 NOTE — Progress Notes (Signed)
Patient was discharge vial stretcher accompanied by [facility]

## 2023-08-04 NOTE — Plan of Care (Signed)

## 2023-08-04 NOTE — Discharge Summary (Addendum)
Name: Brandon [name] MRN: [id number] DOB: Aug 06, 1966 57 y.o. PCP: Armando Gang, FNP  Date of Admission: 07/30/2023  2:22 PM Date of Discharge:  08/04/2023 Attending Physician: Dr. Heide Spark  DISCHARGE DIAGNOSIS:  Primary Problem: ICH (intracerebral hemorrhage) [facility])   Hospital Problems: Principal Problem:   ICH (intracerebral hemorrhage) (HCC) Active Problems:   Fall    DISCHARGE MEDICATIONS:   Allergies as of 08/04/2023   No Known Allergies      Medication List     TAKE these medications    ascorbic acid 500 MG tablet Commonly known as: VITAMIN C Take 500 mg by mouth daily.   Aspirin Low Dose 81 MG tablet Generic drug: aspirin EC Take 1 tablet (81 mg total) by mouth daily. Start taking on: August 13, 2023 What changed: These instructions start on August 13, 2023. If you are unsure what to do until then, ask your doctor or other care provider.   busPIRone 10 MG tablet Commonly known as: BUSPAR Take 1 tablet (10 mg total) by mouth 2 (two) times daily.   clopidogrel 75 MG tablet Commonly known as: PLAVIX Take 1 tablet (75 mg total) by mouth daily. Start taking on: August 14, 2023 What changed: These instructions start on August 14, 2023. If you are unsure what to do until then, ask your doctor or other care provider.   famotidine 20 MG tablet Commonly known as: PEPCID Take 1 tablet (20 mg total) by mouth 2 (two) times daily.   feeding supplement Liqd Take 237 mLs by mouth 3 (three) times daily with meals. What changed: when to take this   FeroSul 325 (65 Fe) MG tablet Generic drug: ferrous sulfate Take 1 tablet (325 mg total) by mouth daily.   folic acid 1 MG tablet Commonly known as: FOLVITE Take 1 tablet (1 mg total) by mouth daily.   Gerhardt's butt cream Crea Apply 1 Application topically 2 (two) times daily.   HM LIDOCAINE PATCH EX Apply 1 patch topically daily. Apply to left shoulder for pain - 5% patch   insulin glargine 100  UNIT/ML Solostar Pen Commonly known as: LANTUS Inject 27 Units into the skin at bedtime.   insulin lispro 100 UNIT/ML injection Commonly known as: HUMALOG Inject 2-10 Units into the skin 3 (three) times daily before meals. Per sliding scale: 70-130= 0 units, 131-180= 2 units, 181-240= 4 units, 241-300= 6 units, 301-350= 8 units, 351-400= 10 units   levETIRAcetam 500 MG tablet Commonly known as: KEPPRA Take 1 tablet (500 mg total) by mouth 2 (two) times daily for 2 days.   lipase/protease/amylase 78295 UNITS Cpep capsule Commonly known as: CREON Take 1 capsule (36,000 Units total) by mouth 3 (three) times daily with meals. What changed: when to take this   metFORMIN 1000 MG tablet Commonly known as: GLUCOPHAGE Take 1 tablet (1,000 mg total) by mouth 2 (two) times daily with a meal.   metoprolol tartrate 100 MG tablet Commonly known as: LOPRESSOR Take 100 mg by mouth daily.   multivitamin with minerals Tabs tablet Take 1 tablet by mouth daily.   mupirocin ointment 2 % Commonly known as: BACTROBAN Apply topically 2 (two) times daily.   nicotine 14 mg/24hr patch Commonly known as: NICODERM CQ - dosed in mg/24 hours Place 1 patch (14 mg total) onto the skin daily.   oxyCODONE 5 MG immediate release tablet Commonly known as: Oxy IR/ROXICODONE Take 1 tablet (5 mg total) by mouth every 6 (six) hours as needed for up to  5 days for moderate pain (pain score 4-6) or severe pain (pain score 7-10).   rosuvastatin 5 MG tablet Commonly known as: CRESTOR Take 5 mg by mouth at bedtime.   senna-docusate 8.6-50 MG tablet Commonly known as: Senokot-S Take 1 tablet by mouth 2 (two) times daily.   tamsulosin 0.4 MG Caps capsule Commonly known as: FLOMAX Take 1 capsule (0.4 mg total) by mouth daily after supper. What changed: when to take this   traZODone 150 MG tablet Commonly known as: DESYREL Take 1 tablet (150 mg total) by mouth at bedtime.               Discharge Care  Instructions  (From admission, onward)           Start     Ordered   08/04/23 0000  Discharge wound care:       Comments: Active Pressure Injury/Wound(s)     Pressure Ulcer  Duration         Pressure Injury 01/07/22 Coccyx Medial Stage 2 -  Partial thickness loss of dermis presenting as a shallow open injury with a red, pink wound bed without slough. discolored 573 days   Pressure Injury 01/14/22 Hip Lateral;Right Stage 2 -  Partial thickness loss of dermis presenting as a shallow open injury with a red, pink wound bed without slough. 566 days   Pressure Injury 01/14/22 Knee Lateral;Right Unstageable - Full thickness tissue loss in which the base of the injury is covered by slough (yellow, tan, gray, green or brown) and/or eschar (tan, brown or black) in the wound bed. 566 days   Pressure Injury 01/14/22 Shoulder Left;Posterior Stage 2 -  Partial thickness loss of dermis presenting as a shallow open injury with a red, pink wound bed without slough. 566 days      Wound Care Orders (From admission, onward)      Start     Ordered   07/31/23 1000    Wound care  2 times daily      Comments: 1.        Clean abrasions to L temple/face/ear with NS, apply Mupirocin 2 times daily. May cover with Telfa nonstick pad and tape or may leave open to air if unable to get any dressing to stay on.   2.Clean penis/scrotum/inner thighs with soap and water, dry and apply Gerhardt's Butt Cream to area 2 times a day and prn soiling. May sprinkle over Gerhardt's to scrotum and thighs with floor stock antifungal powder (green label Microguard) for extra drying effect.  07/31/23 0849   07/31/23 0848    Wound care  Daily      Comments: Clean abrasions to L shoulder and B hands with NS, apply Vaseline gauze [place] #239) cut to fit wound beds daily, cover with Telfa nonstick dressing and silicone foam to secure  07/31/23 0847      Laceration Repair (From admission, onward)      Start     Ordered   07/31/23  1147    .Marland KitchenLaceration Repair  Once      Comments: This order was created via procedure documentation   08/04/23 1142   08/02/23 0000  Discharge wound care:       Comments: Pressure Injury 01/07/22 Coccyx Medial Stage 2 -  Partial thickness loss of dermis presenting as a shallow open injury with a red, pink wound bed without slough. discolored 571 days    Pressure Injury 01/14/22 Hip Lateral;Right Stage 2 -  Partial thickness loss  of dermis presenting as a shallow open injury with a red, pink wound bed without slough. 564 days   Pressure Injury 01/14/22 Knee Lateral;Right Unstageable - Full thickness tissue loss in which the base of the injury is covered by slough (yellow, tan, gray, green or brown) and/or eschar (tan, brown or black) in the wound bed. 564 days   Pressure Injury 01/14/22 Shoulder Left;Posterior Stage 2 -  Partial thickness loss of dermis presenting as a shallow open injury with a red, pink wound bed without slough. 564 days      Wound Care Orders (From admission, onward)      Start     Ordered   07/31/23 1000    Wound care  2 times daily      Comments: 1.        Clean abrasions to L temple/face/ear with NS, apply Mupirocin 2 times daily. May cover with Telfa nonstick pad and tape or may leave open to air if unable to get any dressing to stay on.   2.Clean penis/scrotum/inner thighs with soap and water, dry and apply Gerhardt's Butt Cream to area 2 times a day and prn soiling. May sprinkle over Gerhardt's to scrotum and thighs with floor stock antifungal powder (green label Microguard) for extra drying effect.  07/31/23 0849   07/31/23 0848    Wound care  Daily      Comments: Clean abrasions to L shoulder and B hands with NS, apply Vaseline gauze [place] #239) cut to fit wound beds daily, cover with Telfa nonstick dressing and silicone foam to secure  07/31/23 0847      Laceration Repair (From admission, onward)      Start     Ordered   07/31/23 1147    .Marland KitchenLaceration  Repair  Once      Comments: This order was created via procedure documentation  07/31/23 1146   08/02/23 1108   08/01/23 0000  Discharge wound care:       Comments: 09/29/22 1000    Wound care  2 times daily      Comments: 1.        Clean abrasions to L temple/face/ear with NS, apply Mupirocin 2 times daily. May cover with Telfa nonstick pad and tape or may leave open to air if unable to get any dressing to stay on.   2.Clean penis/scrotum/inner thighs with soap and water, dry and apply Gerhardt's Butt Cream to area 2 times a day and prn soiling. May sprinkle over Gerhardt's to scrotum and thighs with floor stock antifungal powder (green label Microguard) for extra drying effect.  07/31/23 0849    07/31/23 0848    Wound care  Daily      Comments: Clean abrasions to L shoulder and B hands with NS, apply Vaseline gauze [place] #239) cut to fit wound beds daily, cover with Telfa nonstick dressing and silicone foam to secure  07/31/23 0847      Laceration Repair (From admission, onward)      Start     Ordered   07/31/23 1147    .Marland KitchenLaceration Repair  Once      Comments: This order was created via procedure documentation  07/31/23 1146   08/01/23 1331            DISPOSITION AND FOLLOW-UP:  Brandon [name] was discharged from [facility] in Stable condition. At the hospital follow up visit please address:  Follow-up Recommendations: Consults: Orthopedic Surgery Labs: Basic Metabolic Profile and  CBC Medications: Keppra for 7 days. Holding plavix and aspirin for 2 weeks resume on 11/20 . Consider transitioning to only 1 antiplatelet agent instead of 2 if medically appropriate.  Follow-up Appointments:  Discharged to SNF  Contact information for after-discharge care     Destination     [facility] .   Service: Skilled Nursing Contact information: [address] [place] [address] [phone]                      HOSPITAL COURSE:  Patient Summary: Brandon [name] is a 57 y.o. 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   #Parenchymal Hemorrhage #Subarachnoid Hemorrhage  CT Head in the ED was concerning for parenchymal and subarachnoid hemorrhage without significant mass effect or midline shift. No acute fractures noted. Neurosurgery was consulted iand recommended repeat CT Head, and BID Keppra for 7 days for seizure prophylaxis. Repeat CT showed a slight increase in the size of the intraparenchymal bleed/hemorrhagic contusion of the medial left frontal lobe (51mm-13mm). The hemorrhagic foci in the frontal lobes bilaterally were unchanged from prior CT.  Patient was stable for discharge on 11/8; however, patient intially refused discharge to acute rehab center. Family contacted without response. On 11/11 patient amenable to return to facility. Continues to be stable for discharge.  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 Has been getting lidocaine patches at SNF. Ortho recommended a sling for comfort but can ROM and WB as tolerated. F/u with ortho in 1 month if still symptomatic.    -can use Tylenol 1000 mg Q6 PRN -Lidocaine patch 5% -Oxycodone 5 mg Q6 PRN for pain -Sling for comfort, can WB and ROM as tolerated  -FU with ortho if symptoms are still persistent   DM Fasting glucose is 169 today. Likely due to decreased PO intake. He will remain on SSI today. At home he is on Lantus 27 units at bedtime, Humalog SSI, Metformin 1000 mg BID and Trulicity 1.5 mg every Tuesday.   Pancreatic Insufficiency RD was consulted.  Recommended Creon 36,000 units TID  -cw same  Severe malnutrition  RD consulted, appreciate recs.  MV with minerals  Ensure Enlive and increase to TID with meals, each supplement provides 350 kcal and 20 grams of protein. Pt prefers strawberry.   Anemia Patient Hgb 11.2 on admission. Hgb 11.4 today. -Continue Ferrous sulfate 325 mg -Continue Folic acid 1 mg  -Continue to monitor CBC  BPH -Continue Flomax 0.4 mg daily  Chronic Conditions: HTN HLD -Continue Lopressor 100 mg -Continue Crestor 5 mg   GERD -Continue Pepcid 20 mg   GAD -Continue Buspirone 10 mg BID -Trazodone 150 mg daily at bedtime   Hx of Stroke CAD s/p DES -Holding home Plavix for 2 weeks -Holding home ASA 81 for 2 weeks   Constipation  -On Senna S BID today as he had not had any BM for a few days.   DVT prophylaxis Patient was on SCD as anticoagulation was held in setting of intraparenchymal hemorrhage. He persistently declined SCDs during this admission despite redirection and discussing risks. No shortness of breath, tachycardia, or evidence of embolism  on discharge exam.      DISCHARGE INSTRUCTIONS:   Discharge Instructions     Call MD for:   Complete by: As directed    If neurological changes, patient will need to be seen in the emergency department for repeat CT head and neurosurgery consult   Call MD for:  difficulty breathing, headache or visual disturbances   Complete by: As directed    Call MD for:  difficulty breathing, headache or visual disturbances   Complete by: As directed    Call MD for:  difficulty breathing, headache or visual disturbances   Complete by: As directed    Call MD for:  extreme fatigue   Complete by: As directed    Call MD for:  extreme fatigue   Complete by: As directed    Call MD for:  extreme fatigue   Complete by: As directed    Call MD for:  hives   Complete by: As directed    Call MD for:  hives   Complete by: As directed    Call MD for:  hives    Complete by: As directed    Call MD for:  persistant dizziness or light-headedness   Complete by: As directed    Call MD for:  persistant dizziness or light-headedness   Complete by: As directed    Call MD for:  persistant dizziness or light-headedness   Complete by: As directed    Call MD for:  persistant nausea and vomiting   Complete by: As directed    Call MD for:  persistant nausea and vomiting   Complete by: As directed    Call MD for:  persistant nausea and vomiting   Complete by: As directed    Call MD for:  redness, tenderness, or signs of infection (pain, swelling, redness, odor or green/yellow discharge around incision site)   Complete by: As directed    Call MD for:  redness, tenderness, or signs of infection (pain, swelling, redness, odor or green/yellow discharge around incision site)   Complete by: As directed    Call MD for:  redness, tenderness, or signs of infection (pain, swelling, redness, odor or green/yellow discharge around incision site)   Complete by: As directed    Call MD for:  severe uncontrolled pain   Complete by: As directed    Call MD for:  severe uncontrolled pain   Complete by: As directed    Call MD for:  severe uncontrolled pain   Complete by: As directed    Call MD for:  temperature >100.4   Complete by: As directed    Call MD for:  temperature >100.4   Complete by: As directed    Call MD for:  temperature >100.4   Complete by: As directed    Diet - low sodium heart healthy   Complete by: As directed    Diet - low sodium heart healthy   Complete by: As directed    Diet - low sodium heart healthy   Complete by: As directed    Discharge instructions   Complete by: As directed    Brandon [name], Thank you for allowing [place] to take care of you today. You were here because you had a fall and a brain bleed.   He is to remain off plavix and aspirin until 11/20 He is to take Keppra 500mg  twice daily until 11/13  For his left shoulder:  Ortho  recommended a sling for comfort but can have regular range  of motion or be weight bearing if he can tolerate it.    -can use Tylenol 1000 mg Q6 PRN -Lidocaine patch 5% daily -Oxycodone 5 mg Q6 PRN for pain for 5 days -FU with ortho if symptoms are still persistent in a month   A nutrition consult was placed. He was found to be on severe malnutrition.  -He is to take a daily multivitamin with minerals  -Ensure Enlive and increase to TID with meals, each supplement provides 350 kcal and 20 grams of protein. Pt prefers strawberry.   For his Pancreatic Insufficiency it is recommended that he takes Creon 36,000 units TID   You will be discharge to [facility] where you have been residing for the past months. We have communicated with them to continue your care. Take care of you, Drs. [name] and Daiva Eves   Discharge instructions   Complete by: As directed    Brandon [name], Thank you for allowing [place] to take care of you today. You were here because you had a fall and a brain bleed.   He is to remain off plavix and aspirin until 11/20 He is to take Keppra 500mg  twice daily until 11/13  For his left shoulder:  Ortho recommended a sling for comfort but can have regular range of motion or be weight bearing if he can tolerate it.    -can use Tylenol 1000 mg Q6 PRN -Lidocaine patch 5% daily -Oxycodone 5 mg Q6 PRN for pain for 5 days -FU with ortho if symptoms are still persistent in a month   A nutrition consult was placed. He was found to be on severe malnutrition.  -He is to take a daily multivitamin with minerals  -Ensure Enlive and increase to TID with meals, each supplement provides 350 kcal and 20 grams of protein. Pt prefers strawberry.   For his Pancreatic Insufficiency it is recommended that he takes Creon 36,000 units TID   You will be discharge to [facility] where you have been residing for the past months. We have communicated with them to continue your  care. Take care of you, Drs. [name] and Daiva Eves   Discharge wound care:   Complete by: As directed    09/29/22 1000    Wound care  2 times daily      Comments: 1.        Clean abrasions to L temple/face/ear with NS, apply Mupirocin 2 times daily. May cover with Telfa nonstick pad and tape or may leave open to air if unable to get any dressing to stay on.   2.Clean penis/scrotum/inner thighs with soap and water, dry and apply Gerhardt's Butt Cream to area 2 times a day and prn soiling. May sprinkle over Gerhardt's to scrotum and thighs with floor stock antifungal powder (green label Microguard) for extra drying effect.  07/31/23 0849    07/31/23 0848    Wound care  Daily      Comments: Clean abrasions to L shoulder and B hands with NS, apply Vaseline gauze [place] #239) cut to fit wound beds daily, cover with Telfa nonstick dressing and silicone foam to secure  07/31/23 0847      Laceration Repair (From admission, onward)      Start     Ordered   07/31/23 1147    .Marland KitchenLaceration Repair  Once      Comments: This order was created via procedure documentation  07/31/23 1146   Discharge wound care:   Complete by: As directed  Pressure Injury 01/07/22 Coccyx Medial Stage 2 -  Partial thickness loss of dermis presenting as a shallow open injury with a red, pink wound bed without slough. discolored 571 days    Pressure Injury 01/14/22 Hip Lateral;Right Stage 2 -  Partial thickness loss of dermis presenting as a shallow open injury with a red, pink wound bed without slough. 564 days   Pressure Injury 01/14/22 Knee Lateral;Right Unstageable - Full thickness tissue loss in which the base of the injury is covered by slough (yellow, tan, gray, green or brown) and/or eschar (tan, brown or black) in the wound bed. 564 days   Pressure Injury 01/14/22 Shoulder Left;Posterior Stage 2 -  Partial thickness loss of dermis presenting as a shallow open injury with a red, pink wound bed without  slough. 564 days      Wound Care Orders (From admission, onward)      Start     Ordered   07/31/23 1000    Wound care  2 times daily      Comments: 1.        Clean abrasions to L temple/face/ear with NS, apply Mupirocin 2 times daily. May cover with Telfa nonstick pad and tape or may leave open to air if unable to get any dressing to stay on.   2.Clean penis/scrotum/inner thighs with soap and water, dry and apply Gerhardt's Butt Cream to area 2 times a day and prn soiling. May sprinkle over Gerhardt's to scrotum and thighs with floor stock antifungal powder (green label Microguard) for extra drying effect.  07/31/23 0849   07/31/23 0848    Wound care  Daily      Comments: Clean abrasions to L shoulder and B hands with NS, apply Vaseline gauze [place] #239) cut to fit wound beds daily, cover with Telfa nonstick dressing and silicone foam to secure  07/31/23 0847      Laceration Repair (From admission, onward)      Start     Ordered   07/31/23 1147    .Marland KitchenLaceration Repair  Once      Comments: This order was created via procedure documentation  07/31/23 1146   Discharge wound care:   Complete by: As directed    Active Pressure Injury/Wound(s)     Pressure Ulcer  Duration         Pressure Injury 01/07/22 Coccyx Medial Stage 2 -  Partial thickness loss of dermis presenting as a shallow open injury with a red, pink wound bed without slough. discolored 573 days   Pressure Injury 01/14/22 Hip Lateral;Right Stage 2 -  Partial thickness loss of dermis presenting as a shallow open injury with a red, pink wound bed without slough. 566 days   Pressure Injury 01/14/22 Knee Lateral;Right Unstageable - Full thickness tissue loss in which the base of the injury is covered by slough (yellow, tan, gray, green or brown) and/or eschar (tan, brown or black) in the wound bed. 566 days   Pressure Injury 01/14/22 Shoulder Left;Posterior Stage 2 -  Partial thickness loss of dermis presenting as a shallow  open injury with a red, pink wound bed without slough. 566 days      Wound Care Orders (From admission, onward)      Start     Ordered   07/31/23 1000    Wound care  2 times daily      Comments: 1.        Clean abrasions to L temple/face/ear with NS, apply Mupirocin 2 times  daily. May cover with Telfa nonstick pad and tape or may leave open to air if unable to get any dressing to stay on.   2.Clean penis/scrotum/inner thighs with soap and water, dry and apply Gerhardt's Butt Cream to area 2 times a day and prn soiling. May sprinkle over Gerhardt's to scrotum and thighs with floor stock antifungal powder (green label Microguard) for extra drying effect.  07/31/23 0849   07/31/23 0848    Wound care  Daily      Comments: Clean abrasions to L shoulder and B hands with NS, apply Vaseline gauze [place] #239) cut to fit wound beds daily, cover with Telfa nonstick dressing and silicone foam to secure  07/31/23 0847      Laceration Repair (From admission, onward)      Start     Ordered   07/31/23 1147    .Marland KitchenLaceration Repair  Once      Comments: This order was created via procedure documentation   Increase activity slowly   Complete by: As directed    Increase activity slowly   Complete by: As directed    Increase activity slowly   Complete by: As directed        SUBJECTIVE:   Feeling bad still but not worse than yesterday. Would like to watch TV without interruption.   Discharge Vitals:   BP 126/78 (BP Location: Left Arm)   Pulse 69   Temp 97.6 F (36.4 C) (Oral)   Resp 18   Ht 6\' 1"  (1.854 m)   Wt 72.7 kg   SpO2 97%   BMI 21.15 kg/m   OBJECTIVE:  Physical Exam Constitutional:      General: He is not in acute distress.    Appearance: He is not ill-appearing or toxic-appearing.  HENT:     Mouth/Throat:     Mouth: Mucous membranes are moist.  Cardiovascular:     Rate and Rhythm: Normal rate and regular rhythm.     Heart sounds: No murmur heard.    No friction rub.  No gallop.  Pulmonary:     Effort: Pulmonary effort is normal. No respiratory distress.  Abdominal:     General: There is no distension.     Tenderness: There is no abdominal tenderness. There is no guarding or rebound.  Musculoskeletal:     Right lower leg: No edema.     Left lower leg: No edema.  Skin:    Comments: Abrasions over the left temple, hand and shoulder.  Neurological:     Mental Status: He is alert and oriented to person, place, and time.     Pertinent Labs, Studies, and Procedures:     Latest Ref Rng & Units 08/01/2023    8:47 AM 08/01/2023    7:53 AM 07/31/2023    6:32 AM  CBC  WBC 4.0 - 10.5 K/uL 8.1  7.7  8.8   Hemoglobin 13.0 - 17.0 g/dL 16.1  09.6  04.5   Hematocrit 39.0 - 52.0 % 34.5  33.7  32.7   Platelets 150 - 400 K/uL 257  256  273        Latest Ref Rng & Units 07/31/2023    6:32 AM 07/30/2023    2:24 PM 01/16/2022    3:19 AM  CMP  Glucose 70 - 99 mg/dL 409  811  88   BUN 6 - 20 mg/dL 9  11  10    Creatinine 0.61 - 1.24 mg/dL 9.14  7.82  9.56  Sodium 135 - 145 mmol/L 136  139  135   Potassium 3.5 - 5.1 mmol/L 3.7  4.4  3.7   Chloride 98 - 111 mmol/L 102  102  103   CO2 22 - 32 mmol/L 23  25  28    Calcium 8.9 - 10.3 mg/dL 9.1  9.4  8.2   Total Protein 6.5 - 8.1 g/dL 6.1  6.6    Total Bilirubin <1.2 mg/dL 0.3  0.6    Alkaline Phos 38 - 126 U/L 222  252    AST 15 - 41 U/L 15  15    ALT 0 - 44 U/L 17  16      CT Head Wo Contrast  Addendum Date: 08/01/2023   ADDENDUM REPORT: 08/01/2023 12:24 ADDENDUM: Please note the ventricular dilatation is similar to the prior CTs dating back to at least 2023 and likely related to general volume loss. Normal pressure hydrocephalus is not excluded. Clinical correlation is recommended. Electronically Signed   By: Elgie Collard M.D.   On: 08/01/2023 12:24   Result Date: 08/01/2023 CLINICAL DATA:  Head trauma.  Follow-up intracranial bleed. EXAM: CT HEAD WITHOUT CONTRAST TECHNIQUE: Contiguous axial images were  obtained from the base of the skull through the vertex without intravenous contrast. RADIATION DOSE REDUCTION: This exam was performed according to the departmental dose-optimization program which includes automated exposure control, adjustment of the mA and/or kV according to patient size and/or use of iterative reconstruction technique. COMPARISON:  Earlier CT dated 07/30/2023. FINDINGS: Brain: Slight interval increase in the size of intraparenchymal bleed/hemorrhagic contusion of the medial left frontal lobe (15/2) measuring 13 mm in diameter (previously approximately 7 mm). Additional hemorrhagic foci in the frontal lobes bilaterally are relatively similar to prior CT. There is moderate age-related atrophy and chronic microvascular ischemic changes. Old right frontal infarct and encephalomalacia. No mass effect or midline shift. No extra-axial fluid collection. Vascular: No hyperdense vessel or unexpected calcification. Skull: Normal. Negative for fracture or focal lesion. Sinuses/Orbits: No acute finding. Other: None IMPRESSION: 1. Slight interval increase in the size of intraparenchymal bleed/hemorrhagic contusion of the medial left frontal lobe. 2. Additional hemorrhagic foci in the frontal lobes bilaterally are relatively similar to prior CT. 3. Moderate age-related atrophy and chronic microvascular ischemic changes. Old right frontal infarct and encephalomalacia. Electronically Signed: By: Elgie Collard M.D. On: 07/30/2023 22:47   CT SHOULDER LEFT W WO CONTRAST  Result Date: 07/31/2023 CLINICAL DATA:  Chronic shoulder pain. Rotator cuff disorder suspected. Larey Seat out of wheelchair 07/30/2023. EXAM: CT OF THE UPPER LEFT EXTREMITY WITHOUT AND WITH CONTRAST TECHNIQUE: Multidetector CT imaging of the upper left extremity was performed prior to and following the standard protocol before and during bolus administration of intravenous contrast. RADIATION DOSE REDUCTION: This exam was performed according to  the departmental dose-optimization program which includes automated exposure control, adjustment of the mA and/or kV according to patient size and/or use of iterative reconstruction technique. COMPARISON:  Left shoulder radiographs 07/30/2023 CONTRAST:  75mL OMNIPAQUE IOHEXOL 350 MG/ML SOLN FINDINGS: Bones/Joint/Cartilage There is diffuse decreased bone mineralization. There is a small curvilinear fleck of bone mineralization measuring approximately 7 x 1 mm bordering the superior anterior aspect of the humeral head deep to the anterior supraspinatus tendon insertion (sagittal series 16, coronal series 15 image 50/83). This is favored to represent an age indeterminate avulsion injury at the supraspinatus tendon footprint insertion on the humeral head. 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 The majority of the anterior supraspinatus tendon fibers remain intact. Chronic calcific tendinosis of the rotator cuff is also considered. There is mild sclerosis just deep to the humeral head cortex deep to the interdigitating posterior supraspinatus and anterior infraspinatus tendon footprint insertions, likely chronic. Moderate multilevel degenerative disc changes of the mid to upper thoracic spine. Complete anterior bridging osteophytosis at the C6-7 level. Ligaments Suboptimally assessed by CT. Muscles and Tendons Mild atrophy of the anterior supraspinatus muscle. Soft tissues There is moderate atherosclerotic calcification within the aortic arch. High-grade dense coronary artery calcifications. Mild curvilinear subsegmental atelectasis versus scarring within the lingula. IMPRESSION: As seen on prior radiographs, there is a small curvilinear fleck of bone mineralization bordering the superior anterior aspect of the humeral head deep to the anterior  supraspinatus tendon insertion. This is favored to represent an age indeterminate mild avulsion injury at the supraspinatus tendon footprint insertion on the humeral head. 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 Chronic calcific tendinosis of the rotator cuff is also considered. Aortic Atherosclerosis (ICD10-I70.0). Electronically Signed   By: Neita Garnet M.D.   On: 07/31/2023 17:04   DG Knee 1-2 Views Left  Result Date: 07/30/2023 CLINICAL DATA:  Fall.  Left knee pain. EXAM: LEFT KNEE - 1-2 VIEW COMPARISON:  None Available. FINDINGS: No acute fracture or dislocation. No aggressive osseous lesion. No significant arthritis of the knee joint. No knee effusion or focal soft tissue swelling. No radiopaque foreign bodies. IMPRESSION: No acute osseous abnormality Electronically Signed   By: Jules Schick M.D.   On: 07/30/2023 19:04   DG Shoulder Left Port  Result Date: 07/30/2023 CLINICAL DATA:  Larey Seat out of wheelchair, left shoulder injury and abrasion EXAM: LEFT SHOULDER COMPARISON:  01/14/2022 FINDINGS: Internal rotation, external rotation, transscapular views of the left shoulder are obtained. There is a small curvilinear ossific density adjacent to the greater tuberosity of the left humerus, best seen on the internal and external rotation views. This could reflect sequela from calcific tendinopathy or small avulsion fracture given history of recent trauma. Correlation with physical exam recommended. There is mild superficial soft tissue swelling overlying the humeral head and acromion process. Mild degenerative changes of the acromioclavicular and glenohumeral joints. Left chest is clear. IMPRESSION: 1. Small ossific density adjacent to the greater tuberosity of the left humerus, which could reflect a small acute fracture versus sequela  of calcific rotator cuff tendinopathy. Please correlate with physical exam findings. 2. Mild superficial soft tissue swelling overlying the left shoulder. 3. Mild acromioclavicular and glenohumeral joint osteoarthritis. Electronically Signed   By: Sharlet Salina M.D.   On: 07/30/2023 17:09   DG Pelvis Portable  Result Date: 07/30/2023 CLINICAL DATA:  Larey Seat out of wheelchair EXAM: PORTABLE PELVIS 1-2 VIEWS COMPARISON:  06/16/2012 FINDINGS: Single frontal view of the pelvis includes both hips. No acute fracture, subluxation, or dislocation. Mild bilateral hip osteoarthritis. Prominent spondylosis and facet hypertrophy at the lumbosacral junction. Sacroiliac joints are unremarkable. IMPRESSION: 1. Degenerative changes of the lumbar spine and bilateral hips. 2. No acute fracture. Electronically Signed   By: Sharlet Salina M.D.   On: 07/30/2023 17:07   DG CHEST PORT 1 VIEW  Result Date: 07/30/2023 CLINICAL DATA:  Larey Seat out of wheelchair, hit head EXAM: PORTABLE CHEST 1 VIEW COMPARISON:  01/14/2022 FINDINGS: Single frontal view of the chest demonstrates an unremarkable cardiac silhouette. No acute airspace disease, effusion, or pneumothorax. There are no acute displaced fractures. IMPRESSION: 1. No acute intrathoracic process. Electronically Signed   By: Sharlet Salina M.D.   On: 07/30/2023 17:06   CT Head Wo Contrast  Result Date: 07/30/2023 CLINICAL DATA:  Fall on Plavix EXAM: CT HEAD WITHOUT CONTRAST CT CERVICAL SPINE WITHOUT CONTRAST TECHNIQUE: Multidetector CT imaging of the head and cervical spine was performed following the standard protocol without intravenous contrast. Multiplanar CT image reconstructions of the cervical spine were also generated. RADIATION DOSE REDUCTION: This exam was performed according to the departmental dose-optimization program which includes automated exposure control, adjustment of the mA and/or kV according to patient size and/or use of iterative reconstruction technique.  COMPARISON:  01/07/2022 CT head and cervical spine FINDINGS: CT HEAD FINDINGS Brain: Multiple hyperdense foci in the medial frontal lobes bilaterally (series 3, images 15-20 and 25-27), most likely parenchymal and subarachnoid hemorrhage. No significant subdural or epidural collection. Redemonstrated global parenchymal volume loss with prominence the ventricles and extra-axial spaces, which appear advanced for age but unchanged from the prior exam. Encephalomalacia and gliosis in right frontal lobe is again noted. Additional remote infarct in the right caudate head. No evidence of acute infarct, mass, mass effect, or midline shift. No hydrocephalus. Vascular: No hyperdense vessel. Skull: Negative for fracture or focal lesion. Sinuses/Orbits: No acute finding. Other: The mastoid air cells are well aerated. CT CERVICAL SPINE FINDINGS Alignment: No traumatic listhesis. Trace retrolisthesis of C3 on C4, C4 on C5, and C5 on C6, which appears facet mediated. Levocurvature is likely positional. Skull base and vertebrae: No acute fracture or suspicious osseous lesion. Unchanged compression deformity of T1. Soft tissues and spinal canal: No prevertebral fluid or swelling. No visible canal hematoma. Disc levels: Degenerative changes in the cervical spine.Moderate to severe spinal canal stenosis at C3-C4 and C4-C5. Upper chest: No focal pulmonary opacity or pleural effusion. IMPRESSION: 1. Multiple hyperdense foci in the medial frontal lobes bilaterally, most likely parenchymal and subarachnoid hemorrhage. No significant subdural or epidural collection. No significant mass effect or midline shift. 2. No acute fracture or traumatic listhesis in the cervical spine. These results were called by telephone at the time of interpretation on 07/30/2023 at 3:05 pm to provider MADISON [facility] , who verbally acknowledged these results. Electronically Signed   By: Wiliam Ke M.D.   On: 07/30/2023 15:05   CT Cervical Spine Wo  Contrast  Result Date: 07/30/2023 CLINICAL DATA:  Fall on Plavix EXAM: CT HEAD WITHOUT CONTRAST CT CERVICAL SPINE WITHOUT CONTRAST TECHNIQUE: Multidetector CT imaging of the head and cervical spine was performed following the standard protocol without intravenous contrast. Multiplanar CT image reconstructions of the cervical spine were also generated. RADIATION DOSE REDUCTION: This exam was performed according to the departmental dose-optimization program which includes automated exposure control, adjustment of the mA and/or kV according to patient size and/or use of iterative reconstruction technique. COMPARISON:  01/07/2022 CT head and cervical spine FINDINGS: CT HEAD FINDINGS Brain: Multiple hyperdense foci in the medial frontal lobes bilaterally (series 3, images 15-20 and 25-27), most likely parenchymal and subarachnoid hemorrhage. No significant subdural or epidural collection. Redemonstrated global parenchymal volume loss with prominence the ventricles and extra-axial spaces, which appear advanced for age but unchanged from the prior exam. Encephalomalacia and gliosis in right frontal lobe is again noted. Additional remote infarct in the right caudate head. No evidence of acute infarct, mass, mass  effect, or midline shift. No hydrocephalus. Vascular: No hyperdense vessel. Skull: Negative for fracture or focal lesion. Sinuses/Orbits: No acute finding. Other: The mastoid air cells are well aerated. CT CERVICAL SPINE FINDINGS Alignment: No traumatic listhesis. Trace retrolisthesis of C3 on C4, C4 on C5, and C5 on C6, which appears facet mediated. Levocurvature is likely positional. Skull base and vertebrae: No acute fracture or suspicious osseous lesion. Unchanged compression deformity of T1. Soft tissues and spinal canal: No prevertebral fluid or swelling. No visible canal hematoma. Disc levels: Degenerative changes in the cervical spine.Moderate to severe spinal canal stenosis at C3-C4 and C4-C5. Upper  chest: No focal pulmonary opacity or pleural effusion. IMPRESSION: 1. Multiple hyperdense foci in the medial frontal lobes bilaterally, most likely parenchymal and subarachnoid hemorrhage. No significant subdural or epidural collection. No significant mass effect or midline shift. 2. No acute fracture or traumatic listhesis in the cervical spine. These results were called by telephone at the time of interpretation on 07/30/2023 at 3:05 pm to provider MADISON [facility] , who verbally acknowledged these results. Electronically Signed   By: Wiliam Ke M.D.   On: 07/30/2023 15:05     Signed: Manuela Neptune, MD Morene Crocker, MD  [facility]  Pager: [phone] 11:43 AM, 08/04/2023

## 2023-08-11 ENCOUNTER — Emergency Department (HOSPITAL_COMMUNITY): Payer: Medicaid Other

## 2023-08-11 ENCOUNTER — Emergency Department (HOSPITAL_COMMUNITY)
Admission: EM | Admit: 2023-08-11 | Discharge: 2023-08-11 | Disposition: A | Discharge status: Home / Self Care | Payer: Medicaid Other | Attending: Emergency Medicine | Admitting: Emergency Medicine

## 2023-08-11 ENCOUNTER — Other Ambulatory Visit: Payer: Self-pay

## 2023-08-11 ENCOUNTER — Encounter (HOSPITAL_COMMUNITY): Payer: Self-pay

## 2023-08-11 DIAGNOSIS — I2510 Atherosclerotic heart disease of native coronary artery without angina pectoris: Secondary | ICD-10-CM | POA: Insufficient documentation

## 2023-08-11 DIAGNOSIS — Z794 Long term (current) use of insulin: Secondary | ICD-10-CM | POA: Diagnosis not present

## 2023-08-11 DIAGNOSIS — W06XXXA Fall from bed, initial encounter: Secondary | ICD-10-CM | POA: Insufficient documentation

## 2023-08-11 DIAGNOSIS — M25552 Pain in left hip: Secondary | ICD-10-CM | POA: Diagnosis present

## 2023-08-11 DIAGNOSIS — Z7982 Long term (current) use of aspirin: Secondary | ICD-10-CM | POA: Diagnosis not present

## 2023-08-11 DIAGNOSIS — W19XXXA Unspecified fall, initial encounter: Secondary | ICD-10-CM

## 2023-08-11 MED ORDER — OXYCODONE-ACETAMINOPHEN 5-325 MG PO TABS
1.0000 | ORAL_TABLET | Freq: Once | ORAL | Status: AC
  Administered 2023-08-11: 1 via ORAL
  Filled 2023-08-11: qty 1

## 2023-08-11 NOTE — ED Triage Notes (Signed)
EMS reports from [facility], fall from bed. C/o left hip and pelvic pain. No LOC blood thinners, head strike or obvious injuries.  BP 142/76 HR 84 RR 16 CBG 200

## 2023-08-11 NOTE — ED Provider Notes (Signed)
[place] EMERGENCY DEPARTMENT AT [facility] Provider Note   CSN: [id number] Arrival date & time: 08/11/23  4098     History  Chief Complaint  Patient presents with   Fall   Hip Pain    Brandon HANDYSIDE is a 57 y.o. male with past medical history significant for coronary artery disease, previous stroke, bilateral carotid artery stenosis, GERD, chronic pancreatitis, history of alcohol abuse, who is currently living at a care facility, recently seen and admitted secondary to a fall, head injury, intracerebral hemorrhage on 11/6.  Patient reports that he rolled out of bed this morning, because of the slippery nests of diaper and fell directly onto left hip.  He denies any head injury, loss of consciousness.  He reported pain as 7/10 at rest, 9/10 with movement of the left hip.  Has not taken anything for pain.  He denies any numbness, tingling.   Fall  Hip Pain       Home Medications Prior to Admission medications   Medication Sig Start Date End Date Taking? Authorizing Provider  ascorbic acid (VITAMIN C) 500 MG tablet Take 500 mg by mouth daily. 11/26/21   [provider]  aspirin EC 81 MG tablet Take 1 tablet (81 mg total) by mouth daily. 08/13/23   Morene Crocker, MD  busPIRone (BUSPAR) 10 MG tablet Take 1 tablet (10 mg total) by mouth 2 (two) times daily. 12/23/20   Leatha Gilding, MD  clopidogrel (PLAVIX) 75 MG tablet Take 1 tablet (75 mg total) by mouth daily. 08/14/23   Morene Crocker, MD  famotidine (PEPCID) 20 MG tablet Take 1 tablet (20 mg total) by mouth 2 (two) times daily. 12/23/20   Leatha Gilding, MD  feeding supplement (ENSURE ENLIVE / ENSURE PLUS) LIQD Take 237 mLs by mouth 3 (three) times daily with meals. 08/01/23   [name], [place], MD  FEROSUL 325 (65 Fe) MG tablet Take 1 tablet (325 mg total) by mouth daily. 12/23/20   Leatha Gilding, MD  folic acid (FOLVITE) 1 MG tablet Take 1 tablet (1 mg total) by mouth daily.  12/23/20   Leatha Gilding, MD  HM LIDOCAINE PATCH EX Apply 1 patch topically daily. Apply to left shoulder for pain - 5% patch    [provider]  insulin glargine (LANTUS) 100 UNIT/ML Solostar Pen Inject 27 Units into the skin at bedtime.    [provider]  insulin lispro (HUMALOG) 100 UNIT/ML injection Inject 2-10 Units into the skin 3 (three) times daily before meals. Per sliding scale: 70-130= 0 units, 131-180= 2 units, 181-240= 4 units, 241-300= 6 units, 301-350= 8 units, 351-400= 10 units    [provider]  levETIRAcetam (KEPPRA) 500 MG tablet Take 1 tablet (500 mg total) by mouth 2 (two) times daily for 2 days. 08/04/23 08/06/23  Morene Crocker, MD  lipase/protease/amylase (CREON) 36000 UNITS CPEP capsule Take 1 capsule (36,000 Units total) by mouth 3 (three) times daily with meals. 08/01/23   [name], [place], MD  metFORMIN (GLUCOPHAGE) 1000 MG tablet Take 1 tablet (1,000 mg total) by mouth 2 (two) times daily with a meal. 12/23/20   Gherghe, Daylene Katayama, MD  metoprolol tartrate (LOPRESSOR) 100 MG tablet Take 100 mg by mouth daily. 10/25/21   [provider]  Multiple Vitamin (MULTIVITAMIN WITH MINERALS) TABS tablet Take 1 tablet by mouth daily. 12/23/20   Leatha Gilding, MD  mupirocin ointment (BACTROBAN) 2 % Apply topically 2 (two) times daily. 08/01/23  Hassan Rowan, [place], MD  nicotine (NICODERM CQ - DOSED IN MG/24 HOURS) 14 mg/24hr patch Place 1 patch (14 mg total) onto the skin daily. 08/02/23   [name], [place], MD  Nystatin (GERHARDT'S BUTT CREAM) CREA Apply 1 Application topically 2 (two) times daily. 08/01/23   [name], [place], MD  rosuvastatin (CRESTOR) 5 MG tablet Take 5 mg by mouth at bedtime.    [provider]  senna-docusate (SENOKOT-S) 8.6-50 MG tablet Take 1 tablet by mouth 2 (two) times daily. 08/01/23   [name], [place], MD  tamsulosin (FLOMAX) 0.4 MG CAPS capsule Take 1 capsule  (0.4 mg total) by mouth daily after supper. 08/01/23   [name], [place], MD  traZODone (DESYREL) 150 MG tablet Take 1 tablet (150 mg total) by mouth at bedtime. 08/01/23   Manuela Neptune, MD      Allergies    Patient has no known allergies.    Review of Systems   Review of Systems  All other systems reviewed and are negative.   Physical Exam Updated Vital Signs BP 101/76   Pulse 70   Temp 97.9 F (36.6 C) (Oral)   Resp 16   SpO2 99%  Physical Exam Vitals and nursing note reviewed.  Constitutional:      General: He is not in acute distress.    Appearance: Normal appearance.  HENT:     Head: Normocephalic and atraumatic.  Eyes:     General:        Right eye: No discharge.        Left eye: No discharge.  Cardiovascular:     Rate and Rhythm: Normal rate and regular rhythm.  Pulmonary:     Effort: Pulmonary effort is normal. No respiratory distress.  Musculoskeletal:        General: No deformity.     Comments: Mild redness, tenderness, with no step-off, deformity of the left hip over the greater trochanter.  Normal passive and active range of motion to flexion, extension of the left hip.  Tach strength 4/5, somewhat diminished at baseline, patient currently using wheelchair for ambulation, reports that he was using a rollator prior to his most recent fall.  Skin:    General: Skin is warm and dry.  Neurological:     Mental Status: He is alert and oriented to person, place, and time.  Psychiatric:        Mood and Affect: Mood normal.        Behavior: Behavior normal.     ED Results / Procedures / Treatments   Labs (all labs ordered are listed, but only abnormal results are displayed) Labs Reviewed - No data to display  EKG None  Radiology DG Hip Unilat W or Wo Pelvis 2-3 Views Left  Result Date: 08/11/2023 CLINICAL DATA:  Fall.  Left hip and pelvic pain. EXAM: DG HIP (WITH OR WITHOUT PELVIS) 2-3V LEFT COMPARISON:  None Available. FINDINGS:  Pelvis is intact with normal and symmetric sacroiliac joints. No acute fracture or dislocation. No aggressive osseous lesion. Visualized sacral arcuate lines are unremarkable. Unremarkable symphysis pubis. There are mild degenerative changes of bilateral hip joints without significant joint space narrowing. Osteophytosis of the superior acetabulum. No radiopaque foreign bodies. IMPRESSION: *No acute osseous abnormality of the pelvis or left hip joint. Electronically Signed   By: Jules Schick M.D.   On: 08/11/2023 10:57    Procedures Procedures    Medications Ordered in ED Medications  oxyCODONE-acetaminophen (PERCOCET/ROXICET) 5-325 MG per tablet 1 tablet (1 tablet Oral Given 08/11/23  [place])    ED Course/ Medical Decision Making/ A&P                                 Medical Decision Making Amount and/or Complexity of Data Reviewed Radiology: ordered.  Risk Prescription drug management.   This patient is a 57 y.o. male  who presents to the ED for concern of left hip pain after fall from bed to ground.   Differential diagnoses prior to evaluation: The emergent differential diagnosis includes, but is not limited to, acute fracture, dislocation, versus soft tissue bruising, contusion. This is not an exhaustive differential.   Past Medical History / Co-morbidities / Social History: coronary artery disease, previous stroke, bilateral carotid artery stenosis, GERD, chronic pancreatitis, history of alcohol abuse, who is currently living at a care facility  Additional history: Chart reviewed. Pertinent results include: Sensibly reviewed the send lab work, imaging, patient was admitted just a few days ago after a fall with traumatic intracerebral hemorrhage  Physical Exam: Physical exam performed. The pertinent findings include: He has some lingering bruising around the face from his fall prior to recent admission, today however he has some mild redness over some of the left hip, normal  range of motion throughout  Lab Tests/Imaging studies: I personally interpreted labs/imaging and the pertinent results include: Independently interpreted plain film radiograph of the left hip which shows no evidence of acute fracture, dislocation or other significant injury. I agree with the radiologist interpretation.  Medications: I ordered medication including Percocet for pain, encouraged tylenol for pain going forward.  I have reviewed the patients home medicines and have made adjustments as needed.   Disposition: After consideration of the diagnostic results and the patients response to treatment, I feel that patient is stable for discharge with plan as above.   emergency department workup does not suggest an emergent condition requiring admission or immediate intervention beyond what has been performed at this time. The plan is: as above. The patient is safe for discharge and has been instructed to return immediately for worsening symptoms, change in symptoms or any other concerns.  Final Clinical Impression(s) / ED Diagnoses Final diagnoses:  Fall, initial encounter  Left hip pain    Rx / DC Orders ED Discharge Orders     None         [place] 08/11/23 1106    Alvira Monday, MD 08/12/23 2307

## 2023-08-11 NOTE — Discharge Instructions (Addendum)
Please use Tylenol for pain.  You may use 1000 mg of Tylenol every 6 hours.  Not to exceed 4 g of Tylenol within 24 hours.  

## 2023-12-31 ENCOUNTER — Encounter (HOSPITAL_COMMUNITY): Payer: Self-pay

## 2023-12-31 ENCOUNTER — Emergency Department (HOSPITAL_COMMUNITY)

## 2023-12-31 ENCOUNTER — Other Ambulatory Visit: Payer: Self-pay

## 2023-12-31 ENCOUNTER — Emergency Department (HOSPITAL_COMMUNITY)
Admission: EM | Admit: 2023-12-31 | Discharge: 2024-01-01 | Disposition: A | Discharge status: Home / Self Care | Attending: Emergency Medicine | Admitting: Emergency Medicine

## 2023-12-31 DIAGNOSIS — M545 Low back pain, unspecified: Secondary | ICD-10-CM | POA: Insufficient documentation

## 2023-12-31 DIAGNOSIS — W06XXXA Fall from bed, initial encounter: Secondary | ICD-10-CM | POA: Diagnosis not present

## 2023-12-31 DIAGNOSIS — W19XXXA Unspecified fall, initial encounter: Secondary | ICD-10-CM

## 2023-12-31 DIAGNOSIS — Z7902 Long term (current) use of antithrombotics/antiplatelets: Secondary | ICD-10-CM | POA: Insufficient documentation

## 2023-12-31 DIAGNOSIS — Z7982 Long term (current) use of aspirin: Secondary | ICD-10-CM | POA: Diagnosis not present

## 2023-12-31 DIAGNOSIS — Z794 Long term (current) use of insulin: Secondary | ICD-10-CM | POA: Insufficient documentation

## 2023-12-31 NOTE — ED Triage Notes (Signed)
 Pt brought by EMS from a "roll out of bed" at [facility]. Complaining of lower back pain (acute on chronic). L hand swollen from previous occurrence a few days ago. Pt refusing to get bathed at facility. PT A&Ox4. (-) LOC.(-) thinner. (-) hit head.

## 2024-01-01 NOTE — ED Provider Notes (Signed)
 [place] EMERGENCY DEPARTMENT AT [facility] Provider Note   CSN: [id number] Arrival date & time: 12/31/23  2305     History  Chief Complaint  Patient presents with   Brandon [name] is a 58 y.o. male.  The history is provided by the patient.  Fall This is a new problem. The current episode started less than 1 hour ago. The problem occurs constantly. The problem has been resolved. Pertinent negatives include no chest pain, no abdominal pain, no headaches and no shortness of breath. Nothing aggravates the symptoms. Nothing relieves the symptoms. He has tried nothing for the symptoms. The treatment provided no relief.  Reportedly rolled out of bed onto back.  Did not hit head, no LOC.       Home Medications Prior to Admission medications   Medication Sig Start Date End Date Taking? Authorizing Provider  ascorbic acid (VITAMIN C) 500 MG tablet Take 500 mg by mouth daily. 11/26/21   [provider]  aspirin EC 81 MG tablet Take 1 tablet (81 mg total) by mouth daily. 08/13/23   Morene Crocker, MD  busPIRone (BUSPAR) 10 MG tablet Take 1 tablet (10 mg total) by mouth 2 (two) times daily. 12/23/20   Leatha Gilding, MD  clopidogrel (PLAVIX) 75 MG tablet Take 1 tablet (75 mg total) by mouth daily. 08/14/23   Morene Crocker, MD  famotidine (PEPCID) 20 MG tablet Take 1 tablet (20 mg total) by mouth 2 (two) times daily. 12/23/20   Leatha Gilding, MD  feeding supplement (ENSURE ENLIVE / ENSURE PLUS) LIQD Take 237 mLs by mouth 3 (three) times daily with meals. 08/01/23   [name], [place], MD  FEROSUL 325 (65 Fe) MG tablet Take 1 tablet (325 mg total) by mouth daily. 12/23/20   Leatha Gilding, MD  folic acid (FOLVITE) 1 MG tablet Take 1 tablet (1 mg total) by mouth daily. 12/23/20   Leatha Gilding, MD  HM LIDOCAINE PATCH EX Apply 1 patch topically daily. Apply to left shoulder for pain - 5% patch    [provider]  insulin  glargine (LANTUS) 100 UNIT/ML Solostar Pen Inject 27 Units into the skin at bedtime.    [provider]  insulin lispro (HUMALOG) 100 UNIT/ML injection Inject 2-10 Units into the skin 3 (three) times daily before meals. Per sliding scale: 70-130= 0 units, 131-180= 2 units, 181-240= 4 units, 241-300= 6 units, 301-350= 8 units, 351-400= 10 units    [provider]  levETIRAcetam (KEPPRA) 500 MG tablet Take 1 tablet (500 mg total) by mouth 2 (two) times daily for 2 days. 08/04/23 08/06/23  Morene Crocker, MD  lipase/protease/amylase (CREON) 36000 UNITS CPEP capsule Take 1 capsule (36,000 Units total) by mouth 3 (three) times daily with meals. 08/01/23   [name], [place], MD  metFORMIN (GLUCOPHAGE) 1000 MG tablet Take 1 tablet (1,000 mg total) by mouth 2 (two) times daily with a meal. 12/23/20   Gherghe, Daylene Katayama, MD  metoprolol tartrate (LOPRESSOR) 100 MG tablet Take 100 mg by mouth daily. 10/25/21   [provider]  Multiple Vitamin (MULTIVITAMIN WITH MINERALS) TABS tablet Take 1 tablet by mouth daily. 12/23/20   Leatha Gilding, MD  mupirocin ointment (BACTROBAN) 2 % Apply topically 2 (two) times daily. 08/01/23   [name], [place], MD  nicotine (NICODERM CQ - DOSED IN MG/24 HOURS) 14 mg/24hr patch Place 1 patch (14 mg total) onto the skin daily. 08/02/23   Manuela Neptune, MD  Nystatin (GERHARDT'S BUTT CREAM) CREA Apply 1 Application topically 2 (two) times daily. 08/01/23   [name], [place], MD  rosuvastatin (CRESTOR) 5 MG tablet Take 5 mg by mouth at bedtime.    [provider]  senna-docusate (SENOKOT-S) 8.6-50 MG tablet Take 1 tablet by mouth 2 (two) times daily. 08/01/23   [name], [place], MD  tamsulosin (FLOMAX) 0.4 MG CAPS capsule Take 1 capsule (0.4 mg total) by mouth daily after supper. 08/01/23   [name], [place], MD  traZODone (DESYREL) 150 MG tablet Take 1 tablet (150 mg total) by mouth at bedtime.  08/01/23   Manuela Neptune, MD      Allergies    Patient has no known allergies.    Review of Systems   Review of Systems  Respiratory:  Negative for shortness of breath.   Cardiovascular:  Negative for chest pain.  Gastrointestinal:  Negative for abdominal pain.  Musculoskeletal:  Positive for back pain.  Neurological:  Negative for weakness and headaches.  All other systems reviewed and are negative.   Physical Exam Updated Vital Signs BP 105/84   Pulse (!) 113   Resp 16   Ht 6' (1.829 m)   Wt 64.4 kg   SpO2 96%   BMI 19.26 kg/m  Physical Exam Vitals and nursing note reviewed.  Constitutional:      General: He is not in acute distress.    Appearance: Normal appearance. He is well-developed. He is not diaphoretic.  HENT:     Head: Normocephalic and atraumatic.     Nose: Nose normal.  Eyes:     Conjunctiva/sclera: Conjunctivae normal.     Pupils: Pupils are equal, round, and reactive to light.  Cardiovascular:     Rate and Rhythm: Normal rate and regular rhythm.     Pulses: Normal pulses.     Heart sounds: Normal heart sounds.  Pulmonary:     Effort: Pulmonary effort is normal.     Breath sounds: Normal breath sounds. No wheezing or rales.  Abdominal:     General: Bowel sounds are normal.     Palpations: Abdomen is soft.     Tenderness: There is no abdominal tenderness. There is no guarding or rebound.  Musculoskeletal:        General: Normal range of motion.     Cervical back: Normal range of motion and neck supple.  Skin:    General: Skin is warm and dry.     Capillary Refill: Capillary refill takes less than 2 seconds.  Neurological:     General: No focal deficit present.     Mental Status: He is alert and oriented to person, place, and time.     Deep Tendon Reflexes: Reflexes normal.  Psychiatric:        Mood and Affect: Mood normal.        Behavior: Behavior normal.     ED Results / Procedures / Treatments   Labs (all labs ordered are  listed, but only abnormal results are displayed) Labs Reviewed - No data to display  EKG None  Radiology DG Lumbar Spine 2-3 Views Result Date: 01/01/2024 CLINICAL DATA:  Initial evaluation for acute pain. EXAM: LUMBAR SPINE - 2-3 VIEW COMPARISON:  None Available. FINDINGS: Straightening of the normal lumbar lordosis. No significant listhesis. Transitional features about the lumbosacral junction with a partially sacralized L5 vertebral body Mild chronic wedging deformity of the T12 vertebral body noted. Endplate Schmorl's node deformity with mild height loss present at the inferior endplate of T11.  No acute fracture. Visualized sacrum and pelvis intact. Underlying osteopenia. Lower lumbar degenerative disc disease at L3-4 and L4-5. Lower lumbar facet hypertrophy. Osteopenia noted. Visualized soft tissues demonstrate no acute finding. IMPRESSION: No radiographic evidence for acute abnormality within the lumbar spine. Electronically Signed   By: Rise Mu M.D.   On: 01/01/2024 03:05    Procedures Procedures    Medications Ordered in ED Medications - No data to display  ED Course/ Medical Decision Making/ A&P                                 Medical Decision Making Rolled out of bed   Amount and/or Complexity of Data Reviewed Independent Historian: EMS    Details: See above  External Data Reviewed: notes.    Details: Previous notes reviewed  Radiology: ordered and independent interpretation performed.    Details: No dislocations seen   Risk Risk Details: Patient without tenderness.  No head injury. Stable for discharge.     Final Clinical Impression(s) / ED Diagnoses Final diagnoses:  Fall, initial encounter  No signs of systemic illness or infection. The patient is nontoxic-appearing on exam and vital signs are within normal limits.  I have reviewed the triage vital signs and the nursing notes. Pertinent labs & imaging results that were available during my care of the  patient were reviewed by me and considered in my medical decision making (see chart for details). After history, exam, and medical workup I feel the patient has been appropriately medically screened and is safe for discharge home. Pertinent diagnoses were discussed with the patient. Patient was given return precautio  Rx / DC Orders ED Discharge Orders     None         Sharne Linders, MD 01/01/24 [phone]

## 2024-01-06 ENCOUNTER — Emergency Department (HOSPITAL_COMMUNITY)

## 2024-01-06 ENCOUNTER — Other Ambulatory Visit: Payer: Self-pay

## 2024-01-06 ENCOUNTER — Inpatient Hospital Stay (HOSPITAL_COMMUNITY)
Admission: EM | Admit: 2024-01-06 | Discharge: 2024-01-21 | DRG: 871 | Disposition: A | Discharge status: Skilled Nursing Facility | Source: Skilled Nursing Facility | Attending: Internal Medicine | Admitting: Internal Medicine

## 2024-01-06 ENCOUNTER — Encounter (HOSPITAL_COMMUNITY): Payer: Self-pay | Admitting: Emergency Medicine

## 2024-01-06 DIAGNOSIS — N3081 Other cystitis with hematuria: Secondary | ICD-10-CM | POA: Diagnosis present

## 2024-01-06 DIAGNOSIS — E785 Hyperlipidemia, unspecified: Secondary | ICD-10-CM | POA: Diagnosis present

## 2024-01-06 DIAGNOSIS — E1165 Type 2 diabetes mellitus with hyperglycemia: Secondary | ICD-10-CM

## 2024-01-06 DIAGNOSIS — Z811 Family history of alcohol abuse and dependence: Secondary | ICD-10-CM

## 2024-01-06 DIAGNOSIS — R64 Cachexia: Secondary | ICD-10-CM | POA: Diagnosis present

## 2024-01-06 DIAGNOSIS — N10 Acute tubulo-interstitial nephritis: Secondary | ICD-10-CM | POA: Diagnosis present

## 2024-01-06 DIAGNOSIS — M4620 Osteomyelitis of vertebra, site unspecified: Secondary | ICD-10-CM

## 2024-01-06 DIAGNOSIS — D649 Anemia, unspecified: Secondary | ICD-10-CM | POA: Diagnosis present

## 2024-01-06 DIAGNOSIS — G062 Extradural and subdural abscess, unspecified: Secondary | ICD-10-CM | POA: Diagnosis not present

## 2024-01-06 DIAGNOSIS — G061 Intraspinal abscess and granuloma: Secondary | ICD-10-CM | POA: Diagnosis present

## 2024-01-06 DIAGNOSIS — G8220 Paraplegia, unspecified: Secondary | ICD-10-CM | POA: Diagnosis not present

## 2024-01-06 DIAGNOSIS — Z5329 Procedure and treatment not carried out because of patient's decision for other reasons: Secondary | ICD-10-CM | POA: Diagnosis not present

## 2024-01-06 DIAGNOSIS — Z681 Body mass index (BMI) 19 or less, adult: Secondary | ICD-10-CM | POA: Diagnosis not present

## 2024-01-06 DIAGNOSIS — Z5982 Transportation insecurity: Secondary | ICD-10-CM

## 2024-01-06 DIAGNOSIS — Z794 Long term (current) use of insulin: Secondary | ICD-10-CM

## 2024-01-06 DIAGNOSIS — K8681 Exocrine pancreatic insufficiency: Secondary | ICD-10-CM | POA: Diagnosis present

## 2024-01-06 DIAGNOSIS — R7881 Bacteremia: Secondary | ICD-10-CM | POA: Diagnosis not present

## 2024-01-06 DIAGNOSIS — N12 Tubulo-interstitial nephritis, not specified as acute or chronic: Secondary | ICD-10-CM

## 2024-01-06 DIAGNOSIS — N3080 Other cystitis without hematuria: Secondary | ICD-10-CM | POA: Diagnosis present

## 2024-01-06 DIAGNOSIS — I959 Hypotension, unspecified: Secondary | ICD-10-CM | POA: Diagnosis not present

## 2024-01-06 DIAGNOSIS — Z8619 Personal history of other infectious and parasitic diseases: Secondary | ICD-10-CM

## 2024-01-06 DIAGNOSIS — Z7902 Long term (current) use of antithrombotics/antiplatelets: Secondary | ICD-10-CM

## 2024-01-06 DIAGNOSIS — M4645 Discitis, unspecified, thoracolumbar region: Secondary | ICD-10-CM | POA: Diagnosis not present

## 2024-01-06 DIAGNOSIS — N400 Enlarged prostate without lower urinary tract symptoms: Secondary | ICD-10-CM | POA: Diagnosis present

## 2024-01-06 DIAGNOSIS — B961 Klebsiella pneumoniae [K. pneumoniae] as the cause of diseases classified elsewhere: Secondary | ICD-10-CM | POA: Diagnosis not present

## 2024-01-06 DIAGNOSIS — Z95828 Presence of other vascular implants and grafts: Secondary | ICD-10-CM

## 2024-01-06 DIAGNOSIS — I471 Supraventricular tachycardia, unspecified: Secondary | ICD-10-CM

## 2024-01-06 DIAGNOSIS — I69354 Hemiplegia and hemiparesis following cerebral infarction affecting left non-dominant side: Secondary | ICD-10-CM

## 2024-01-06 DIAGNOSIS — R739 Hyperglycemia, unspecified: Secondary | ICD-10-CM

## 2024-01-06 DIAGNOSIS — R3989 Other symptoms and signs involving the genitourinary system: Secondary | ICD-10-CM | POA: Diagnosis not present

## 2024-01-06 DIAGNOSIS — G40909 Epilepsy, unspecified, not intractable, without status epilepticus: Secondary | ICD-10-CM | POA: Diagnosis present

## 2024-01-06 DIAGNOSIS — R937 Abnormal findings on diagnostic imaging of other parts of musculoskeletal system: Secondary | ICD-10-CM | POA: Insufficient documentation

## 2024-01-06 DIAGNOSIS — Z7982 Long term (current) use of aspirin: Secondary | ICD-10-CM

## 2024-01-06 DIAGNOSIS — R6520 Severe sepsis without septic shock: Secondary | ICD-10-CM | POA: Diagnosis present

## 2024-01-06 DIAGNOSIS — Z5941 Food insecurity: Secondary | ICD-10-CM

## 2024-01-06 DIAGNOSIS — E872 Acidosis, unspecified: Secondary | ICD-10-CM | POA: Diagnosis present

## 2024-01-06 DIAGNOSIS — E222 Syndrome of inappropriate secretion of antidiuretic hormone: Secondary | ICD-10-CM | POA: Diagnosis present

## 2024-01-06 DIAGNOSIS — R109 Unspecified abdominal pain: Secondary | ICD-10-CM | POA: Diagnosis not present

## 2024-01-06 DIAGNOSIS — R7401 Elevation of levels of liver transaminase levels: Secondary | ICD-10-CM | POA: Diagnosis present

## 2024-01-06 DIAGNOSIS — Z1624 Resistance to multiple antibiotics: Secondary | ICD-10-CM | POA: Diagnosis present

## 2024-01-06 DIAGNOSIS — I2510 Atherosclerotic heart disease of native coronary artery without angina pectoris: Secondary | ICD-10-CM | POA: Diagnosis present

## 2024-01-06 DIAGNOSIS — G8929 Other chronic pain: Secondary | ICD-10-CM | POA: Diagnosis present

## 2024-01-06 DIAGNOSIS — Z560 Unemployment, unspecified: Secondary | ICD-10-CM

## 2024-01-06 DIAGNOSIS — F419 Anxiety disorder, unspecified: Secondary | ICD-10-CM | POA: Diagnosis present

## 2024-01-06 DIAGNOSIS — A419 Sepsis, unspecified organism: Secondary | ICD-10-CM | POA: Diagnosis not present

## 2024-01-06 DIAGNOSIS — Z955 Presence of coronary angioplasty implant and graft: Secondary | ICD-10-CM

## 2024-01-06 DIAGNOSIS — I700 Atherosclerosis of aorta: Secondary | ICD-10-CM | POA: Diagnosis present

## 2024-01-06 DIAGNOSIS — Z8249 Family history of ischemic heart disease and other diseases of the circulatory system: Secondary | ICD-10-CM

## 2024-01-06 DIAGNOSIS — Z789 Other specified health status: Secondary | ICD-10-CM

## 2024-01-06 DIAGNOSIS — G9341 Metabolic encephalopathy: Secondary | ICD-10-CM | POA: Diagnosis present

## 2024-01-06 DIAGNOSIS — Z7401 Bed confinement status: Secondary | ICD-10-CM

## 2024-01-06 DIAGNOSIS — Z1612 Extended spectrum beta lactamase (ESBL) resistance: Secondary | ICD-10-CM | POA: Diagnosis present

## 2024-01-06 DIAGNOSIS — R000 Tachycardia, unspecified: Secondary | ICD-10-CM | POA: Diagnosis present

## 2024-01-06 DIAGNOSIS — Z79899 Other long term (current) drug therapy: Secondary | ICD-10-CM

## 2024-01-06 DIAGNOSIS — A498 Other bacterial infections of unspecified site: Secondary | ICD-10-CM

## 2024-01-06 DIAGNOSIS — Z7984 Long term (current) use of oral hypoglycemic drugs: Secondary | ICD-10-CM

## 2024-01-06 DIAGNOSIS — Z823 Family history of stroke: Secondary | ICD-10-CM

## 2024-01-06 DIAGNOSIS — A4159 Other Gram-negative sepsis: Principal | ICD-10-CM | POA: Diagnosis present

## 2024-01-06 DIAGNOSIS — E876 Hypokalemia: Secondary | ICD-10-CM | POA: Diagnosis present

## 2024-01-06 DIAGNOSIS — F17210 Nicotine dependence, cigarettes, uncomplicated: Secondary | ICD-10-CM | POA: Diagnosis present

## 2024-01-06 DIAGNOSIS — M4640 Discitis, unspecified, site unspecified: Secondary | ICD-10-CM | POA: Diagnosis present

## 2024-01-06 DIAGNOSIS — K861 Other chronic pancreatitis: Secondary | ICD-10-CM | POA: Diagnosis present

## 2024-01-06 DIAGNOSIS — K219 Gastro-esophageal reflux disease without esophagitis: Secondary | ICD-10-CM | POA: Diagnosis present

## 2024-01-06 DIAGNOSIS — B953 Streptococcus pneumoniae as the cause of diseases classified elsewhere: Secondary | ICD-10-CM

## 2024-01-06 LAB — BASIC METABOLIC PANEL WITH GFR
Anion gap: 15 (ref 5–15)
BUN: 26 mg/dL — ABNORMAL HIGH (ref 6–20)
CO2: 21 mmol/L — ABNORMAL LOW (ref 22–32)
Calcium: 8.6 mg/dL — ABNORMAL LOW (ref 8.9–10.3)
Chloride: 93 mmol/L — ABNORMAL LOW (ref 98–111)
Creatinine, Ser: 0.82 mg/dL (ref 0.61–1.24)
GFR, Estimated: >60 mL/min (ref >60)
Glucose, Bld: 353 mg/dL — ABNORMAL HIGH (ref 70–99)
Potassium: 3.6 mmol/L (ref 3.5–5.1)
Sodium: 129 mmol/L — ABNORMAL LOW (ref 135–145)

## 2024-01-06 LAB — CBC WITH DIFFERENTIAL/PLATELET
Abs Immature Granulocytes: 0.23 10*3/uL — ABNORMAL HIGH (ref 0.00–0.07)
Abs Immature Granulocytes: 0.2 10*3/uL — ABNORMAL HIGH (ref 0.00–0.07)
Basophils Absolute: 0 10*3/uL (ref 0.0–0.1)
Basophils Absolute: 0 10*3/uL (ref 0.0–0.1)
Basophils Relative: 0 %
Basophils Relative: 0 %
Eosinophils Absolute: 0 10*3/uL (ref 0.0–0.5)
Eosinophils Absolute: 0 10*3/uL (ref 0.0–0.5)
Eosinophils Relative: 0 %
Eosinophils Relative: 0 %
HCT: 35.2 % — ABNORMAL LOW (ref 39.0–52.0)
HCT: 34.8 % — ABNORMAL LOW (ref 39.0–52.0)
Hemoglobin: 11.4 g/dL — ABNORMAL LOW (ref 13.0–17.0)
Hemoglobin: 11.2 g/dL — ABNORMAL LOW (ref 13.0–17.0)
Immature Granulocytes: 1 %
Immature Granulocytes: 1 %
Lymphocytes Relative: 3 %
Lymphocytes Relative: 3 %
Lymphs Abs: 0.8 10*3/uL (ref 0.7–4.0)
Lymphs Abs: 0.8 10*3/uL (ref 0.7–4.0)
MCH: 30 pg (ref 26.0–34.0)
MCH: 30.2 pg (ref 26.0–34.0)
MCHC: 32.4 g/dL (ref 30.0–36.0)
MCHC: 32.2 g/dL (ref 30.0–36.0)
MCV: 92.6 fL (ref 80.0–100.0)
MCV: 93.8 fL (ref 80.0–100.0)
Monocytes Absolute: 1.2 10*3/uL — ABNORMAL HIGH (ref 0.1–1.0)
Monocytes Absolute: 0.5 10*3/uL (ref 0.1–1.0)
Monocytes Relative: 5 %
Monocytes Relative: 2 %
Neutro Abs: 23.1 10*3/uL — ABNORMAL HIGH (ref 1.7–7.7)
Neutro Abs: 25 10*3/uL — ABNORMAL HIGH (ref 1.7–7.7)
Neutrophils Relative %: 91 %
Neutrophils Relative %: 94 %
Platelets: UNDETERMINED 10*3/uL (ref 150–400)
Platelets: 478 10*3/uL — ABNORMAL HIGH (ref 150–400)
RBC: 3.8 MIL/uL — ABNORMAL LOW (ref 4.22–5.81)
RBC: 3.71 MIL/uL — ABNORMAL LOW (ref 4.22–5.81)
RDW: 14.5 % (ref 11.5–15.5)
RDW: 14.7 % (ref 11.5–15.5)
Smear Review: INCREASED
WBC: 25.3 10*3/uL — ABNORMAL HIGH (ref 4.0–10.5)
WBC: 26.5 10*3/uL — ABNORMAL HIGH (ref 4.0–10.5)
nRBC: 0 % (ref 0.0–0.2)
nRBC: 0 % (ref 0.0–0.2)

## 2024-01-06 LAB — URINALYSIS, W/ REFLEX TO CULTURE (INFECTION SUSPECTED)
Bilirubin Urine: NEGATIVE
Glucose, UA: 150 mg/dL — AB
Ketones, ur: 5 mg/dL — AB
Nitrite: POSITIVE — AB
Protein, ur: 100 mg/dL — AB
RBC / HPF: >50 RBC/hpf (ref 0–5)
Specific Gravity, Urine: 1.027 (ref 1.005–1.030)
WBC, UA: >50 WBC/hpf (ref 0–5)
pH: 5 (ref 5.0–8.0)

## 2024-01-06 LAB — COMPREHENSIVE METABOLIC PANEL WITH GFR
ALT: 41 U/L (ref 0–44)
AST: 90 U/L — ABNORMAL HIGH (ref 15–41)
Albumin: 2.3 g/dL — ABNORMAL LOW (ref 3.5–5.0)
Alkaline Phosphatase: 191 U/L — ABNORMAL HIGH (ref 38–126)
Anion gap: 16 — ABNORMAL HIGH (ref 5–15)
BUN: 31 mg/dL — ABNORMAL HIGH (ref 6–20)
CO2: 21 mmol/L — ABNORMAL LOW (ref 22–32)
Calcium: 8.7 mg/dL — ABNORMAL LOW (ref 8.9–10.3)
Chloride: 92 mmol/L — ABNORMAL LOW (ref 98–111)
Creatinine, Ser: 0.78 mg/dL (ref 0.61–1.24)
GFR, Estimated: >60 mL/min (ref >60)
Glucose, Bld: 307 mg/dL — ABNORMAL HIGH (ref 70–99)
Potassium: 4.1 mmol/L (ref 3.5–5.1)
Sodium: 129 mmol/L — ABNORMAL LOW (ref 135–145)
Total Bilirubin: 1.2 mg/dL (ref 0.0–1.2)
Total Protein: 7.5 g/dL (ref 6.5–8.1)

## 2024-01-06 LAB — CBG MONITORING, ED
Glucose-Capillary: 416 mg/dL — ABNORMAL HIGH (ref 70–99)
Glucose-Capillary: 357 mg/dL — ABNORMAL HIGH (ref 70–99)
Glucose-Capillary: 345 mg/dL — ABNORMAL HIGH (ref 70–99)

## 2024-01-06 LAB — LACTIC ACID, PLASMA: Lactic Acid, Venous: 1.7 mmol/L (ref 0.5–1.9)

## 2024-01-06 LAB — LIPASE, BLOOD: Lipase: 20 U/L (ref 11–51)

## 2024-01-06 LAB — I-STAT CG4 LACTIC ACID, ED: Lactic Acid, Venous: 1.8 mmol/L (ref 0.5–1.9)

## 2024-01-06 LAB — PHOSPHORUS: Phosphorus: 3 mg/dL (ref 2.5–4.6)

## 2024-01-06 LAB — TROPONIN I (HIGH SENSITIVITY): Troponin I (High Sensitivity): 105 ng/L (ref <18)

## 2024-01-06 LAB — CK: Total CK: 362 U/L (ref 49–397)

## 2024-01-06 LAB — MAGNESIUM: Magnesium: 1.7 mg/dL (ref 1.7–2.4)

## 2024-01-06 MED ORDER — HYDROMORPHONE HCL 1 MG/ML IJ SOLN
0.5000 mg | INTRAMUSCULAR | Status: DC | PRN
  Administered 2024-01-06 – 2024-01-16 (×6): 0.5 mg via INTRAVENOUS
  Filled 2024-01-06 (×5): qty 0.5
  Filled 2024-01-06: qty 1

## 2024-01-06 MED ORDER — ADENOSINE 6 MG/2ML IV SOLN
INTRAVENOUS | Status: AC
  Administered 2024-01-06: 6 mg
  Filled 2024-01-06: qty 4

## 2024-01-06 MED ORDER — SODIUM CHLORIDE 0.9% FLUSH
3.0000 mL | Freq: Two times a day (BID) | INTRAVENOUS | Status: DC
  Administered 2024-01-06 – 2024-01-21 (×29): 3 mL via INTRAVENOUS

## 2024-01-06 MED ORDER — LACTATED RINGERS IV BOLUS
1000.0000 mL | Freq: Once | INTRAVENOUS | Status: AC
  Administered 2024-01-06: 1000 mL via INTRAVENOUS

## 2024-01-06 MED ORDER — VANCOMYCIN HCL IN DEXTROSE 1-5 GM/200ML-% IV SOLN
1000.0000 mg | Freq: Two times a day (BID) | INTRAVENOUS | Status: DC
  Administered 2024-01-06 – 2024-01-07 (×2): 1000 mg via INTRAVENOUS
  Filled 2024-01-06 (×2): qty 200

## 2024-01-06 MED ORDER — ALBUTEROL SULFATE (2.5 MG/3ML) 0.083% IN NEBU: 2.5000 mg | INHALATION_SOLUTION | RESPIRATORY_TRACT | Status: DC | PRN

## 2024-01-06 MED ORDER — METOPROLOL TARTRATE 25 MG PO TABS
25.0000 mg | ORAL_TABLET | Freq: Two times a day (BID) | ORAL | Status: DC
Start: 2024-01-06 — End: 2024-01-06

## 2024-01-06 MED ORDER — METOPROLOL TARTRATE 25 MG PO TABS
25.0000 mg | ORAL_TABLET | Freq: Four times a day (QID) | ORAL | Status: DC
  Administered 2024-01-07 – 2024-01-13 (×22): 25 mg via ORAL
  Filled 2024-01-06 (×22): qty 1

## 2024-01-06 MED ORDER — METHOCARBAMOL 500 MG PO TABS
500.0000 mg | ORAL_TABLET | Freq: Once | ORAL | Status: AC
  Administered 2024-01-06: 500 mg via ORAL
  Filled 2024-01-06: qty 1

## 2024-01-06 MED ORDER — METOPROLOL TARTRATE 25 MG PO TABS
50.0000 mg | ORAL_TABLET | Freq: Once | ORAL | Status: AC
  Administered 2024-01-07: 50 mg via ORAL
  Filled 2024-01-06: qty 2

## 2024-01-06 MED ORDER — TAMSULOSIN HCL 0.4 MG PO CAPS
0.4000 mg | ORAL_CAPSULE | Freq: Every day | ORAL | Status: DC
  Administered 2024-01-07 – 2024-01-21 (×15): 0.4 mg via ORAL
  Filled 2024-01-06 (×14): qty 1

## 2024-01-06 MED ORDER — BUSPIRONE HCL 5 MG PO TABS
10.0000 mg | ORAL_TABLET | Freq: Two times a day (BID) | ORAL | Status: DC
  Administered 2024-01-07 – 2024-01-21 (×29): 10 mg via ORAL
  Filled 2024-01-06 (×13): qty 2
  Filled 2024-01-06: qty 1
  Filled 2024-01-06 (×15): qty 2

## 2024-01-06 MED ORDER — DEXTROSE IN LACTATED RINGERS 5 % IV SOLN: INTRAVENOUS | Status: AC

## 2024-01-06 MED ORDER — SODIUM CHLORIDE (PF) 0.9 % IJ SOLN
INTRAMUSCULAR | Status: AC
  Filled 2024-01-06: qty 50

## 2024-01-06 MED ORDER — IOHEXOL 300 MG/ML  SOLN
100.0000 mL | Freq: Once | INTRAMUSCULAR | Status: AC | PRN
  Administered 2024-01-06: 100 mL via INTRAVENOUS

## 2024-01-06 MED ORDER — LACTATED RINGERS IV SOLN: INTRAVENOUS | Status: AC

## 2024-01-06 MED ORDER — PANCRELIPASE (LIP-PROT-AMYL) 36000-114000 UNITS PO CPEP
36000.0000 [IU] | ORAL_CAPSULE | Freq: Three times a day (TID) | ORAL | Status: DC
  Administered 2024-01-08 – 2024-01-21 (×38): 36000 [IU] via ORAL
  Filled 2024-01-06 (×41): qty 1

## 2024-01-06 MED ORDER — SODIUM CHLORIDE 0.9 % IV SOLN
2.0000 g | Freq: Three times a day (TID) | INTRAVENOUS | Status: DC
  Administered 2024-01-06 – 2024-01-07 (×2): 2 g via INTRAVENOUS
  Filled 2024-01-06 (×2): qty 12.5

## 2024-01-06 MED ORDER — SODIUM CHLORIDE 0.9 % IV BOLUS
500.0000 mL | Freq: Once | INTRAVENOUS | Status: AC
  Administered 2024-01-06: 500 mL via INTRAVENOUS

## 2024-01-06 MED ORDER — MAGNESIUM SULFATE 2 GM/50ML IV SOLN
2.0000 g | Freq: Once | INTRAVENOUS | Status: AC
  Administered 2024-01-07: 2 g via INTRAVENOUS
  Filled 2024-01-06: qty 50

## 2024-01-06 MED ORDER — OXYCODONE HCL 5 MG PO TABS
5.0000 mg | ORAL_TABLET | ORAL | Status: DC | PRN
  Administered 2024-01-08 – 2024-01-15 (×18): 5 mg via ORAL
  Filled 2024-01-06 (×17): qty 1

## 2024-01-06 MED ORDER — METOPROLOL TARTRATE 25 MG PO TABS: 100.0000 mg | ORAL_TABLET | Freq: Every day | ORAL | Status: DC

## 2024-01-06 MED ORDER — METOPROLOL TARTRATE 25 MG PO TABS: 50.0000 mg | ORAL_TABLET | Freq: Two times a day (BID) | ORAL | Status: DC

## 2024-01-06 MED ORDER — LACTATED RINGERS IV BOLUS
500.0000 mL | Freq: Once | INTRAVENOUS | Status: AC
  Administered 2024-01-06: 500 mL via INTRAVENOUS

## 2024-01-06 MED ORDER — METRONIDAZOLE 500 MG/100ML IV SOLN
500.0000 mg | Freq: Two times a day (BID) | INTRAVENOUS | Status: DC
  Administered 2024-01-06 – 2024-01-07 (×2): 500 mg via INTRAVENOUS
  Filled 2024-01-06 (×2): qty 100

## 2024-01-06 MED ORDER — ACETAMINOPHEN 500 MG PO TABS
1000.0000 mg | ORAL_TABLET | Freq: Four times a day (QID) | ORAL | Status: DC | PRN
  Administered 2024-01-17: 1000 mg via ORAL
  Filled 2024-01-06: qty 2

## 2024-01-06 MED ORDER — ADENOSINE 6 MG/2ML IV SOLN
3.0000 mg | Freq: Once | INTRAVENOUS | Status: DC
  Filled 2024-01-06: qty 2

## 2024-01-06 MED ORDER — MORPHINE SULFATE (PF) 4 MG/ML IV SOLN
4.0000 mg | Freq: Once | INTRAVENOUS | Status: AC
  Administered 2024-01-06: 4 mg via INTRAVENOUS
  Filled 2024-01-06: qty 1

## 2024-01-06 MED ORDER — DEXTROSE 50 % IV SOLN
0.0000 mL | INTRAVENOUS | Status: DC | PRN
  Administered 2024-01-16: 50 mL via INTRAVENOUS
  Filled 2024-01-06: qty 50

## 2024-01-06 MED ORDER — INSULIN REGULAR(HUMAN) IN NACL 100-0.9 UT/100ML-% IV SOLN
INTRAVENOUS | Status: DC
  Administered 2024-01-06: 14 [IU]/h via INTRAVENOUS
  Filled 2024-01-06 (×2): qty 100

## 2024-01-06 MED ORDER — TRAZODONE HCL 50 MG PO TABS
150.0000 mg | ORAL_TABLET | Freq: Every day | ORAL | Status: DC
  Administered 2024-01-07 – 2024-01-15 (×10): 150 mg via ORAL
  Filled 2024-01-06 (×5): qty 3
  Filled 2024-01-06 (×2): qty 1.5
  Filled 2024-01-06 (×4): qty 3

## 2024-01-06 MED ORDER — ACETAMINOPHEN 650 MG RE SUPP
650.0000 mg | Freq: Once | RECTAL | Status: AC
  Administered 2024-01-06: 650 mg via RECTAL
  Filled 2024-01-06: qty 1

## 2024-01-06 MED ORDER — CIPROFLOXACIN IN D5W 200 MG/100ML IV SOLN
200.0000 mg | Freq: Once | INTRAVENOUS | Status: DC
  Filled 2024-01-06: qty 100

## 2024-01-06 MED ORDER — OXYCODONE HCL 5 MG PO TABS
2.5000 mg | ORAL_TABLET | ORAL | Status: DC | PRN
  Administered 2024-01-17 – 2024-01-18 (×4): 2.5 mg via ORAL
  Filled 2024-01-06 (×7): qty 1

## 2024-01-06 MED ORDER — SODIUM CHLORIDE 0.9 % IV SOLN
1.0000 g | Freq: Once | INTRAVENOUS | Status: AC
  Administered 2024-01-06: 1 g via INTRAVENOUS
  Filled 2024-01-06: qty 10

## 2024-01-06 MED ORDER — ROSUVASTATIN CALCIUM 5 MG PO TABS
5.0000 mg | ORAL_TABLET | Freq: Every day | ORAL | Status: DC
  Administered 2024-01-07 – 2024-01-20 (×15): 5 mg via ORAL
  Filled 2024-01-06 (×16): qty 1

## 2024-01-06 MED ORDER — FAMOTIDINE 20 MG PO TABS
20.0000 mg | ORAL_TABLET | Freq: Two times a day (BID) | ORAL | Status: DC
  Administered 2024-01-07 – 2024-01-21 (×29): 20 mg via ORAL
  Filled 2024-01-06 (×29): qty 1

## 2024-01-06 MED ORDER — NICOTINE 14 MG/24HR TD PT24
14.0000 mg | MEDICATED_PATCH | Freq: Every day | TRANSDERMAL | Status: DC
  Administered 2024-01-07 – 2024-01-21 (×15): 14 mg via TRANSDERMAL
  Filled 2024-01-06 (×15): qty 1

## 2024-01-06 MED ORDER — METOPROLOL TARTRATE 5 MG/5ML IV SOLN
5.0000 mg | Freq: Three times a day (TID) | INTRAVENOUS | Status: DC | PRN
  Administered 2024-01-06: 5 mg via INTRAVENOUS
  Filled 2024-01-06 (×2): qty 5

## 2024-01-06 MED ORDER — LEVETIRACETAM 500 MG PO TABS
500.0000 mg | ORAL_TABLET | Freq: Two times a day (BID) | ORAL | Status: DC
  Administered 2024-01-06 – 2024-01-21 (×29): 500 mg via ORAL
  Filled 2024-01-06 (×29): qty 1

## 2024-01-06 MED ORDER — ADENOSINE 6 MG/2ML IV SOLN
INTRAVENOUS | Status: AC
  Filled 2024-01-06: qty 2

## 2024-01-06 NOTE — ED Provider Notes (Signed)
  EMERGENCY DEPARTMENT AT [facility] Provider Note  CSN: [id number] Arrival date & time: 01/06/24 1142  Chief Complaint(s) No chief complaint on file.  HPI Brandon [name] is a 58 y.o. male here today for back pain, abnormal sodium levels.  Patient living in a skilled nursing facility, has recently been treated for a UTI with antibiotics.   Past Medical History Past Medical History:  Diagnosis Date   Alcohol dependency (HCC)    Hx ETOH withdrawal seizures before 2011   CAD (coronary artery disease) 06/13/2012   Calcification noted on CTA of chest in 2012 Wall motion abnormality on ECHO     Carotid artery disease (HCC) 2016   bilateral.  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    Depression with anxiety    Diabetes mellitus without complication (HCC)    GERD (gastroesophageal reflux disease)    Headache(784.0)    migraine   Heart disease 02/2015   PCI/DES placed to RCA: on chronic Plavix/ASA   Hyperlipidemia    Hypertension    Pancreatitis 2011....    CT findings in May 2011 with inflammation and pseudocyst.  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/2016   Renal artery stenosis, native, bilateral (HCC) 02/2015   Patient Active Problem List   Diagnosis Date Noted   Fall 08/01/2023   ICH (intracerebral hemorrhage) (HCC) 07/30/2023   Pain in left shoulder 03/25/2022   Pressure injury of skin 01/08/2022   Frequent falls 01/08/2022   DKA (diabetic ketoacidosis) (HCC) 01/07/2022   Rhabdomyolysis 01/07/2022   Protein-calorie malnutrition, severe 12/23/2020   Hyperosmolar hyperglycemic state (HHS) (HCC) 12/22/2020   Hyperglycemia due to type 2 diabetes mellitus (HCC) 12/21/2020   Hyperglycemia due to diabetes mellitus (HCC) 12/21/2020   Empyema lung (HCC)    Epidural abscess    Diskitis 02/06/2020   Alcohol withdrawal delirium (HCC) 02/06/2020   Epilepsy (HCC) 02/06/2020    Pleural effusion on right 02/06/2020   History of stroke 01/23/2017   Dry eyes    Sleep disturbance    Essential hypertension, benign    Upper GI bleed    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   Fatty liver    Tobacco abuse    Diabetes mellitus type 2 in nonobese (HCC)    History of CVA with residual deficit    Acute lower UTI    Tachycardia    Chronic alcoholic pancreatitis (HCC)    Hyponatremia 03/09/2016   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   Acute blood loss anemia 03/09/2016   Gastric varices    Alcohol abuse    Left-sided neglect    Severe anemia    UGIB (upper gastrointestinal bleed)    Pressure ulcer 03/06/2016   Acute encephalopathy    Cerebral infarction (HCC) 03/05/2016   Carotid stenosis 04/06/2015   CAD in native artery 03/16/2015   Unstable angina pectoris (HCC) 03/15/2015   Neck pain 10/20/2013   Insomnia 12/29/2012   Dissection of carotid artery (HCC) 12/11/2012   Occlusion and stenosis of carotid artery with cerebral infarction 12/11/2012   Cerebral artery occlusion with cerebral infarction (HCC) 12/11/2012   Fatigue 11/26/2012   Hallux valgus 06/25/2012   Preventative health care 06/25/2012   Alcohol Dependence 06/18/2012   Smoking 06/16/2012   Bilateral extracranial carotid artery stenosis    Coronary Artery Disease 06/13/2012   Ischemic Stroke 06/13/2012  Hyperlipidemia    Hypertension 02/25/2011   GERD (gastroesophageal reflux disease) 02/25/2011   Chronic Pancreatitis. 02/25/2011   Hepatic steatosis 02/25/2011   Home Medication(s) Prior to Admission medications   Medication Sig Start Date End Date Taking? Authorizing Provider  ascorbic acid (VITAMIN C) 500 MG tablet Take 500 mg by mouth daily. 11/26/21   [provider]  aspirin EC 81 MG tablet Take 1 tablet (81 mg total) by mouth daily. 08/13/23   Morene Crocker, MD  busPIRone (BUSPAR) 10 MG tablet Take 1 tablet (10 mg  total) by mouth 2 (two) times daily. 12/23/20   Leatha Gilding, MD  clopidogrel (PLAVIX) 75 MG tablet Take 1 tablet (75 mg total) by mouth daily. 08/14/23   Morene Crocker, MD  famotidine (PEPCID) 20 MG tablet Take 1 tablet (20 mg total) by mouth 2 (two) times daily. 12/23/20   Leatha Gilding, MD  feeding supplement (ENSURE ENLIVE / ENSURE PLUS) LIQD Take 237 mLs by mouth 3 (three) times daily with meals. 08/01/23   [name], [place], MD  FEROSUL 325 (65 Fe) MG tablet Take 1 tablet (325 mg total) by mouth daily. 12/23/20   Leatha Gilding, MD  folic acid (FOLVITE) 1 MG tablet Take 1 tablet (1 mg total) by mouth daily. 12/23/20   Leatha Gilding, MD  HM LIDOCAINE PATCH EX Apply 1 patch topically daily. Apply to left shoulder for pain - 5% patch    [provider]  insulin glargine (LANTUS) 100 UNIT/ML Solostar Pen Inject 27 Units into the skin at bedtime.    [provider]  insulin lispro (HUMALOG) 100 UNIT/ML injection Inject 2-10 Units into the skin 3 (three) times daily before meals. Per sliding scale: 70-130= 0 units, 131-180= 2 units, 181-240= 4 units, 241-300= 6 units, 301-350= 8 units, 351-400= 10 units    [provider]  levETIRAcetam (KEPPRA) 500 MG tablet Take 1 tablet (500 mg total) by mouth 2 (two) times daily for 2 days. 08/04/23 08/06/23  Morene Crocker, MD  lipase/protease/amylase (CREON) 36000 UNITS CPEP capsule Take 1 capsule (36,000 Units total) by mouth 3 (three) times daily with meals. 08/01/23   [name], [place], MD  metFORMIN (GLUCOPHAGE) 1000 MG tablet Take 1 tablet (1,000 mg total) by mouth 2 (two) times daily with a meal. 12/23/20   Gherghe, Daylene Katayama, MD  metoprolol tartrate (LOPRESSOR) 100 MG tablet Take 100 mg by mouth daily. 10/25/21   [provider]  Multiple Vitamin (MULTIVITAMIN WITH MINERALS) TABS tablet Take 1 tablet by mouth daily. 12/23/20   Leatha Gilding, MD  mupirocin ointment (BACTROBAN) 2 %  Apply topically 2 (two) times daily. 08/01/23   [name], [place], MD  nicotine (NICODERM CQ - DOSED IN MG/24 HOURS) 14 mg/24hr patch Place 1 patch (14 mg total) onto the skin daily. 08/02/23   [name], [place], MD  Nystatin (GERHARDT'S BUTT CREAM) CREA Apply 1 Application topically 2 (two) times daily. 08/01/23   [name], [place], MD  rosuvastatin (CRESTOR) 5 MG tablet Take 5 mg by mouth at bedtime.    [provider]  senna-docusate (SENOKOT-S) 8.6-50 MG tablet Take 1 tablet by mouth 2 (two) times daily. 08/01/23   [name], [place], MD  tamsulosin (FLOMAX) 0.4 MG CAPS capsule Take 1 capsule (0.4 mg total) by mouth daily after supper. 08/01/23   [name], [place], MD  traZODone (DESYREL) 150 MG tablet Take 1 tablet (150 mg total) by mouth at bedtime. 08/01/23   Manuela Neptune, MD  Past Surgical History Past Surgical History:  Procedure Laterality Date   CARDIAC CATHETERIZATION N/A 03/15/2015   Procedure: Left Heart Cath;  Surgeon: Laurier Nancy, MD;  Location: [facility] INVASIVE CV LAB;  Service: Cardiovascular;  Laterality: N/A;   CARDIAC CATHETERIZATION N/A 03/16/2015   Procedure: Coronary Stent Intervention;  Surgeon: Alwyn Pea, MD;  Location: [facility] INVASIVE CV LAB;  Service: Cardiovascular;  Laterality: N/A;   CAROTID ANGIOGRAM N/A 06/15/2012   Procedure: CAROTID ANGIOGRAM;  Surgeon: Chuck Hint, MD;  Location[facility] CATH LAB;  Service: Cardiovascular;  Laterality: N/A;   ESOPHAGOGASTRODUODENOSCOPY N/A 03/08/2016   Procedure: ESOPHAGOGASTRODUODENOSCOPY (EGD);  Surgeon: Ruffin Frederick, MD;  Location: [facility] ENDOSCOPY;  Service: Gastroenterology;  Laterality: N/A;   none     PERIPHERAL VASCULAR CATHETERIZATION Left 04/06/2015   Procedure: Carotid PTA/Stent Intervention;  Surgeon: Annice Needy,  MD;  Location: [facility] INVASIVE CV LAB;  Service: Cardiovascular;  Laterality: Left;   Family History Family History  Problem Relation Age of Onset   Stroke Mother        deceased   Coronary artery disease Mother    Alcohol abuse Mother    Cancer Mother    Hypertension Father        alive   Alcohol abuse Father    Diabetes Father    Kidney disease Father    Stroke Maternal Grandmother     Social History Social History   Tobacco Use   Smoking status: Every Day    Current packs/day: 1.00    Average packs/day: 1 pack/day for 30.0 years (30.0 ttl pk-yrs)    Types: Cigarettes   Smokeless tobacco: Never   Tobacco comments:    smoking less  Substance Use Topics   Alcohol use: No    Alcohol/week: 6.0 standard drinks of alcohol    Types: 6 Cans of beer per week    Comment: drinks 6-12 beer daily   Drug use: No   Allergies Patient has no known allergies.  Review of Systems Review of Systems  Physical Exam Vital Signs  I have reviewed the triage vital signs BP 124/83 (BP Location: Left Arm)   Pulse 94   Temp 98.4 F (36.9 C) (Oral)   Resp 17   SpO2 96%   Physical Exam Vitals reviewed.  Constitutional:      Comments: Frail  HENT:     Head: Normocephalic and atraumatic.  Cardiovascular:     Rate and Rhythm: Normal rate.  Pulmonary:     Effort: Pulmonary effort is normal.     Breath sounds: Normal breath sounds.  Abdominal:     General: Abdomen is flat.     Palpations: Abdomen is soft.  Skin:    Comments: No skin breakdown, no sacral decubitus ulcer.  No rash.  Neurological:     Mental Status: He is alert. Mental status is at baseline.     ED Results and Treatments Labs (all labs ordered are listed, but only abnormal results are displayed) Labs Reviewed  COMPREHENSIVE METABOLIC PANEL WITH GFR - Abnormal; Notable for the following components:      Result Value   Sodium 129 (*)    Chloride 92 (*)    CO2 21 (*)    Glucose, Bld 307 (*)    BUN 31 (*)     Calcium 8.7 (*)    Albumin 2.3 (*)    AST 90 (*)    Alkaline Phosphatase 191 (*)    Anion gap 16 (*)  All other components within normal limits  CBC WITH DIFFERENTIAL/PLATELET - Abnormal; Notable for the following components:   WBC 25.3 (*)    RBC 3.80 (*)    Hemoglobin 11.4 (*)    HCT 35.2 (*)    Neutro Abs 23.1 (*)    Monocytes Absolute 1.2 (*)    Abs Immature Granulocytes 0.23 (*)    All other components within normal limits  CULTURE, BLOOD (ROUTINE X 2)  CULTURE, BLOOD (ROUTINE X 2)  URINALYSIS, W/ REFLEX TO CULTURE (INFECTION SUSPECTED)  I-STAT CG4 LACTIC ACID, ED  I-STAT CG4 LACTIC ACID, ED                                                                                                                          Radiology No results found.  Pertinent labs & imaging results that were available during my care of the patient were reviewed by me and considered in my medical decision making (see MDM for details).  Medications Ordered in ED Medications  lactated ringers bolus 1,000 mL (has no administration in time range)  lactated ringers bolus 1,000 mL (1,000 mLs Intravenous New Bag/Given 01/06/24 1310)  morphine (PF) 4 MG/ML injection 4 mg (4 mg Intravenous Given 01/06/24 1311)  iohexol (OMNIPAQUE) 300 MG/ML solution 100 mL (100 mLs Intravenous Contrast Given 01/06/24 1411)                                                                                                                                     Procedures Procedures  (including critical care time)  Medical Decision Making / ED Course   This patient presents to the ED for concern of low sodium, back pain, this involves an extensive number of treatment options, and is a complaint that carries with it a high risk of complications and morbidity.  The differential diagnosis includes pyelonephritis, hyponatremia, hypernatremia, chronic back pain.  MDM: 58 year old male here today for chronic back pain, currently being  treated for UTI, hyponatremia.  Will check labs and the patient to assess his sodium.  With the pain in the back, and being treated for UTI, will obtain CT imaging to assess for pyelonephritis.  Patient's exam a bit limited by chronic contractures.  I was able to view the patient's lower back, no pressure ulcerations.  Afebrile, normotensive.  Reassessment 2:40 PM-patient's white count elevated to 25.  Added on blood cultures  and a lactic acid.  Lactic acid only 1.8.  Patient still waiting urinalysis, instructed nursing staff to In-N-Out cath the patient, however patient refused.  In providing fluids with the patient, he should be able to provide his urine and time.  Patient has gone for CT, awaiting formal read.  Patient was signed out to Dr. Wallace Cullens pending CT read, urinalysis, disposition.   Additional history obtained: -Additional history obtained from EMS -External records from outside source obtained and reviewed including: Chart review including previous notes, labs, imaging, consultation notes   Lab Tests: -I ordered, reviewed, and interpreted labs.   The pertinent results include:   Labs Reviewed  COMPREHENSIVE METABOLIC PANEL WITH GFR - Abnormal; Notable for the following components:      Result Value   Sodium 129 (*)    Chloride 92 (*)    CO2 21 (*)    Glucose, Bld 307 (*)    BUN 31 (*)    Calcium 8.7 (*)    Albumin 2.3 (*)    AST 90 (*)    Alkaline Phosphatase 191 (*)    Anion gap 16 (*)    All other components within normal limits  CBC WITH DIFFERENTIAL/PLATELET - Abnormal; Notable for the following components:   WBC 25.3 (*)    RBC 3.80 (*)    Hemoglobin 11.4 (*)    HCT 35.2 (*)    Neutro Abs 23.1 (*)    Monocytes Absolute 1.2 (*)    Abs Immature Granulocytes 0.23 (*)    All other components within normal limits  CULTURE, BLOOD (ROUTINE X 2)  CULTURE, BLOOD (ROUTINE X 2)  URINALYSIS, W/ REFLEX TO CULTURE (INFECTION SUSPECTED)  I-STAT CG4 LACTIC ACID, ED  I-STAT  CG4 LACTIC ACID, ED      EKG   EKG Interpretation Date/Time:    Ventricular Rate:    PR Interval:    QRS Duration:    QT Interval:    QTC Calculation:   R Axis:      Text Interpretation:           Imaging Studies ordered: I ordered imaging studies including CT imaging of the abdomen pelvis I independently visualized and interpreted imaging. I agree with the radiologist interpretation   Medicines ordered and prescription drug management: Meds ordered this encounter  Medications   lactated ringers bolus 1,000 mL   morphine (PF) 4 MG/ML injection 4 mg   lactated ringers bolus 1,000 mL   iohexol (OMNIPAQUE) 300 MG/ML solution 100 mL    -I have reviewed the patients home medicines and have made adjustments as needed   Cardiac Monitoring: The patient was maintained on a cardiac monitor.  I personally viewed and interpreted the cardiac monitored which showed an underlying rhythm of: Normal sinus rhythm  Social Determinants of Health:  Factors impacting patients care include: Multiple medical comorbidities including prior CVA, skilled nursing home resident.   Reevaluation: After the interventions noted above, I reevaluated the patient and found that they have :improved  Co morbidities that complicate the patient evaluation  Past Medical History:  Diagnosis Date   Alcohol dependency (HCC)    Hx ETOH withdrawal seizures before 2011   CAD (coronary artery disease) 06/13/2012   Calcification noted on CTA of chest in 2012 Wall motion abnormality on ECHO     Carotid artery disease (HCC) 2016   bilateral.  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    Depression with anxiety    Diabetes mellitus without complication (HCC)    GERD (gastroesophageal reflux disease)    Headache(784.0)    migraine   Heart disease 02/2015   PCI/DES placed to RCA: on chronic Plavix/ASA    Hyperlipidemia    Hypertension    Pancreatitis 2011....    CT findings in May 2011 with inflammation and pseudocyst.  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/2016   Renal artery stenosis, native, bilateral (HCC) 02/2015      Dispostion: Signed out to Dr. Martina Sledge.     Final Clinical Impression(s) / ED Diagnoses Final diagnoses:  Flank pain     @PCDICTATION @    Afton Horse T, DO 01/06/24 1443

## 2024-01-06 NOTE — ED Notes (Addendum)
 Patient HR increased into the 200's. EKG extreme tachy with wide complexes. Patient was able to vagal and HR dropped into the 110's. Less than an hour later the patient's HR went into the 200's and BP was 66/30. Martina Sledge MD came bedside. Patient appeared to be in SVT per MD. Adenosine given. HR and BP returned WNL. PRN Metoprolol was given. Segars MD made aware. requests scheduled Metoprolol be given. Message given to oncoming RN. [facility] bedside.

## 2024-01-06 NOTE — ED Triage Notes (Signed)
 Patient presents from [facility] due to back pain. He was recently seen for the same 12/31/2023. Facilities also sent the patient over due to abnormal sodium levels. Patient is currently being treated for UTI and a change in antibiotics was made yesterday.    EMS vitals: 110/70 BP 76 P 93 % SPO2 on room air 97.9 Temp 304 CBG

## 2024-01-06 NOTE — ED Notes (Signed)
 Patient refuses to allow us  to place a foley catheter. He says he, "wants to sleep on it."

## 2024-01-06 NOTE — ED Provider Notes (Addendum)
 11:58 PM Patient signed out to me by previous ED physician. Pt is a 58 yo male presenting for hyponatremia which ends up being stable after corrected for glucose. WBC 25.3 and currently being treated for UTI outpatient. Currently has left flank pain.   Physical Exam  BP 115/80   Pulse 91   Temp 100.2 F (37.9 C) (Rectal)   Resp (!) 26   SpO2 97%   Physical Exam  Procedures  .Critical Care  Performed by: Quinn Bucco, DO Authorized by: Quinn Bucco, DO   Critical care provider statement:    Critical care time (minutes):  105   Critical care was time spent personally by me on the following activities:  Development of treatment plan with patient or surrogate, discussions with consultants, evaluation of patient's response to treatment, examination of patient, ordering and review of laboratory studies, ordering and review of radiographic studies, ordering and performing treatments and interventions, pulse oximetry, re-evaluation of patient's condition and review of old charts   Care discussed with comment:  Neurosurgery, urology, infectious disease   ED Course / MDM    Medical Decision Making Amount and/or Complexity of Data Reviewed Labs: ordered. Radiology: ordered.  Risk OTC drugs. Prescription drug management. Decision regarding hospitalization.   Ct: 1. 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 No well-defined drainable pelvic abscess. 2. Indistinctly marginated parenchymal hypodensity in the right mid kidney, suspicious for pyelonephritis. 3. Scattered abnormal gas in the spinal canal extending from the T12 level through the L5 level, some peripheral and some central in distribution in the canal. Cannot exclude intradural gas or regional spinal canal abscess, although I do not see new definite  bony destructive findings along the endplates to further indicate recurrent discitis. Lumbar MRI with and without contrast is recommended for further characterization, specifically to assess for spinal infection. 4. Heterogeneous density in the pancreas diffusely, pancreatitis is not excluded. Scattered calcifications likely reflect a chronic component of pancreatitis. Correlate with lipase levels. 5. Airway plugging in the right lower lobe with bandlike irregular right lower lobe opacity likely reflecting mild localized pneumonia. Lesser degree of airway plugging in the left lower lobe. 6. 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 7. Prominent stool throughout the colon favors constipation. 8. Coronary and descending thoracic aortic atherosclerosis. Aortic Atherosclerosis (ICD10-I70.0). 9. Distal esophageal wall thickening is nonspecific although esophagitis would be a common cause.    I went back and spoke with the patient who states he has been having bilateral mid back pain for 1 month.  He denies any fevers, chills, night sweats.  He denies a previous history of spinal abscesses.  He denies any spinal injections or lumbar punctures.  He denies midline spinal tenderness.  I spoke with infectious disease who states to cover with ceftriaxone and vancomycin for now.  MRI cervical, thoracic, and lumbar spine ordered with contrast.  I spoke with neurosurgery on-call who agrees to follow patient. Infectious diease also following. Urology following.   Will plan for ED to ED transfer to [place] for SEPSIS FROM PYELONEPHRITIS WITH PNEUMATURIA AND POSSIBLE SPINAL ABSCESS. Mri pending.. while pt waits for stepdown bed. I spoke with Dr. Delana Favors who agrees to admit patient.    11:58 PM Called to bedside. Pt having wide complex tachycardia with a rate of 210 bpm. 12  lead ecg confirmed. Patient vagaled during rectal temp and HR came down to 150 sinus tachy  with ectopic beats. 12 lead repeated. Rectal temperature 101F. Rectal tylenol ordered. PRN metoprolol ordered.   11:58 PM Called to bedside.  Heart rate 200 systolic.  Patient in SVT with a blood pressure of 81/37 down from 130/86. Patient awake and alert.  Adenosine 12 mg given.  Patient converted to sinus tachycardia with a rate of 100 bpm.  Findings thought to be possibly secondary to sepsis.  Patient is already received 2.5 L of IV fluids.  His fever was treated.  Metoprolol given.  Continue to monitor closely.  Consider amiodarone if arrhythmias recur.  Patient in no distress and overall condition improved here in the ED. Detailed discussions were had with the patient regarding current findings, and need for close f/u with PCP or on call doctor. The patient has been instructed to return immediately if the symptoms worsen in any way for re-evaluation. Patient verbalized understanding and is in agreement with current care plan. All questions answered prior to discharge.     Owen Blowers P, DO 01/07/24 0000

## 2024-01-06 NOTE — H&P (Addendum)
 History and Physical    ALYN JURNEY ZOX:[id number] DOB: [birth year] DOA: 01/06/2024  PCP: Armando Gang, FNP   Patient coming from:  [facility]   Chief Complaint: Lower back pain    HPI:  Brandon [name] is a 58 y.o. male with hx of CVA with residual right-sided hemiplegia, Carotid artery disease s/p L carotid stent, SAH, IPH, On seizure prophylaxis, CAD with PCI/stent RCA, diabetes type 2, hx pancreatitis with pancreatic insufficiency, bilateral renal artery stenosis, hypertension, malnutrition, BPH, GERD, mood disorder, who was brought in from his skilled nursing facility due to worsening lower back pain.  On interview he reports he has had lower back pain ongoing for weeks to months and associated with new onset of lower extremity weakness and numbness.  Reports that after his previous stroke he was initially ambulatory with the use of a rollator, R sided hemiplegia. But has been nonambulatory over the past weeks to months.  Has also noted recent urinary changes with hematuria, abdominal pain, subjective fevers.  Appears that he was recently prescribed Augmentin on 4/14 for urinary tract infection.  Due to his discomfort he provides limited history otherwise.   Review of Systems:  ROS limited due to his discomfort, per above.   No Known Allergies  Prior to Admission medications   Medication Sig Start Date End Date Taking? Authorizing Provider  acetaminophen (TYLENOL) 325 MG tablet Take 650 mg by mouth every 6 (six) hours as needed for mild pain (pain score 1-3) (cannot exceed a total of 3,000 mg/24 hours).   Yes [provider]  amoxicillin-clavulanate (AUGMENTIN) 875-125 MG tablet Take 1 tablet by mouth every 12 (twelve) hours. 01/05/24 01/15/24 Yes [provider]  ascorbic acid (VITAMIN C) 500 MG tablet Take 500 mg by mouth daily. 11/26/21  Yes [provider]  aspirin EC 81 MG tablet Take 1 tablet (81 mg total) by mouth daily. 08/13/23  Yes  Morene Crocker, MD  busPIRone (BUSPAR) 10 MG tablet Take 1 tablet (10 mg total) by mouth 2 (two) times daily. 12/23/20   Leatha Gilding, MD  clopidogrel (PLAVIX) 75 MG tablet Take 1 tablet (75 mg total) by mouth daily. 08/14/23   Morene Crocker, MD  famotidine (PEPCID) 20 MG tablet Take 1 tablet (20 mg total) by mouth 2 (two) times daily. 12/23/20   Leatha Gilding, MD  feeding supplement (ENSURE ENLIVE / ENSURE PLUS) LIQD Take 237 mLs by mouth 3 (three) times daily with meals. 08/01/23   [name], [place], MD  FEROSUL 325 (65 Fe) MG tablet Take 1 tablet (325 mg total) by mouth daily. 12/23/20   Leatha Gilding, MD  folic acid (FOLVITE) 1 MG tablet Take 1 tablet (1 mg total) by mouth daily. 12/23/20   Leatha Gilding, MD  HM LIDOCAINE PATCH EX Apply 1 patch topically daily. Apply to left shoulder for pain - 5% patch    [provider]  insulin glargine (LANTUS) 100 UNIT/ML Solostar Pen Inject 27 Units into the skin at bedtime.    [provider]  insulin lispro (HUMALOG) 100 UNIT/ML injection Inject 2-10 Units into the skin 3 (three) times daily before meals. Per sliding scale: 70-130= 0 units, 131-180= 2 units, 181-240= 4 units, 241-300= 6 units, 301-350= 8 units, 351-400= 10 units    [provider]  levETIRAcetam (KEPPRA) 500 MG tablet Take 1 tablet (500 mg total) by mouth 2 (two) times daily for 2 days. 08/04/23 01/06/24  Morene Crocker, MD  lipase/protease/amylase (  CREON) 36000 UNITS CPEP capsule Take 1 capsule (36,000 Units total) by mouth 3 (three) times daily with meals. 08/01/23   [name], [place], MD  metFORMIN (GLUCOPHAGE) 1000 MG tablet Take 1 tablet (1,000 mg total) by mouth 2 (two) times daily with a meal. 12/23/20   Gherghe, Costin M, MD  metoprolol tartrate (LOPRESSOR) 100 MG tablet Take 100 mg by mouth daily. 10/25/21   [provider]  Multiple Vitamin (MULTIVITAMIN WITH MINERALS) TABS tablet Take 1 tablet by  mouth daily. 12/23/20   Gherghe, Costin M, MD  mupirocin ointment (BACTROBAN) 2 % Apply topically 2 (two) times daily. 08/01/23   [name], [place], MD  nicotine (NICODERM CQ - DOSED IN MG/24 HOURS) 14 mg/24hr patch Place 1 patch (14 mg total) onto the skin daily. 08/02/23   [name], [place], MD  Nystatin (GERHARDT'S BUTT CREAM) CREA Apply 1 Application topically 2 (two) times daily. 08/01/23   [name], [place], MD  rosuvastatin (CRESTOR) 5 MG tablet Take 5 mg by mouth at bedtime.    [provider]  senna-docusate (SENOKOT-S) 8.6-50 MG tablet Take 1 tablet by mouth 2 (two) times daily. 08/01/23   [name], [place], MD  tamsulosin (FLOMAX) 0.4 MG CAPS capsule Take 1 capsule (0.4 mg total) by mouth daily after supper. 08/01/23   [name], [place], MD  traZODone (DESYREL) 150 MG tablet Take 1 tablet (150 mg total) by mouth at bedtime. 08/01/23   Jose Ngo, MD    Past Medical History:  Diagnosis Date   Alcohol dependency (HCC)    Hx ETOH withdrawal seizures before 2011   CAD (coronary artery disease) 06/13/2012   Calcification noted on CTA of chest in 2012 Wall motion abnormality on ECHO     Carotid artery disease (HCC) 2016   bilateral.  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    Depression with anxiety    Diabetes mellitus without complication (HCC)    GERD (gastroesophageal reflux disease)    Headache(784.0)    migraine   Heart disease 02/2015   PCI/DES placed to RCA: on chronic Plavix/ASA   Hyperlipidemia    Hypertension    Pancreatitis 2011....    CT findings in May 2011 with inflammation and pseudocyst.  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/2016   Renal artery stenosis, native, bilateral (HCC) 02/2015    Past Surgical History:  Procedure Laterality Date   CARDIAC CATHETERIZATION N/A 03/15/2015   Procedure: Left Heart Cath;   Surgeon: Cherrie Cornwall, MD;  Location: [facility] INVASIVE CV LAB;  Service: Cardiovascular;  Laterality: N/A;   CARDIAC CATHETERIZATION N/A 03/16/2015   Procedure: Coronary Stent Intervention;  Surgeon: Antonette Batters, MD;  Location: [facility] INVASIVE CV LAB;  Service: Cardiovascular;  Laterality: N/A;   CAROTID ANGIOGRAM N/A 06/15/2012   Procedure: CAROTID ANGIOGRAM;  Surgeon: Dannis Dy, MD;  Location: [facility] CATH LAB;  Service: Cardiovascular;  Laterality: N/A;   ESOPHAGOGASTRODUODENOSCOPY N/A 03/08/2016   Procedure: ESOPHAGOGASTRODUODENOSCOPY (EGD);  Surgeon: Danette Duos, MD;  Location: [facility] ENDOSCOPY;  Service: Gastroenterology;  Laterality: N/A;   none     PERIPHERAL VASCULAR CATHETERIZATION Left 04/06/2015   Procedure: Carotid PTA/Stent Intervention;  Surgeon: Celso College, MD;  Location: [facility] INVASIVE CV LAB;  Service: Cardiovascular;  Laterality: Left;     reports that he has been smoking cigarettes. He has a 30 pack-year smoking history. He has never used smokeless tobacco. He reports that he does  not drink alcohol and does not use drugs.  Family History  Problem Relation Age of Onset   Stroke Mother        deceased   Coronary artery disease Mother    Alcohol abuse Mother    Cancer Mother    Hypertension Father        alive   Alcohol abuse Father    Diabetes Father    Kidney disease Father    Stroke Maternal Grandmother      Physical Exam: Vitals:   01/06/24 1909 01/06/24 1935 01/06/24 1940 01/06/24 2000  BP:  (!) 150/102  126/74  Pulse:  (!) 103 (!) 106   Resp:  17    Temp: 98.9 F (37.2 C)     TempSrc: Oral     SpO2:  97% 96%     Gen: Awake, alert, chronically ill-appearing, malnourished CV: Regular, normal S1, S2, 2/6 holosystolic murmur Resp: Normal WOB, diminished in the right base/limited exam due to positioning Abd: Flat, normoactive, mild diffuse tenderness MSK: Lower extremity atrophy bilaterally, contractures.  No edema Skin: No rashes or  lesions to exposed skin  Neuro: Alert and interactive, oriented to person, [facility] (not [place] long), situation.  Not to time ("2020 something", "may "), CN II through XII are intact, speech is clear, motor is 4-5 throughout the right upper extremity.  The left upper extremity is 4 out of 5 proximally but in the hand there are contractures and 1 out of 5 strength in hand intrinsics.  The lower extremities are contracted and hypertonic bilaterally, 0 out of 5 strength.  Sensation is intact and equal in the upper extremities.  In the lower extremities difficult to follow commands and inconsistent responses and difficult to assess sensation.  Psych: uncomfortable, appropriate    Data review:   Labs reviewed, notable for:   Corrected sodium 132 Bicarb 21, anion gap 16 Creatinine 0.7, baseline 0.6 Blood glucose 307 Albumin 2.3 AST 90, alk phos 191 WBC 25, hemoglobin 11 UA appears grossly consistent with infection and color is blue  Micro:  Results for orders placed or performed during the hospital encounter of 01/06/24  Blood culture (routine x 2)     Status: None (Preliminary result)   Collection Time: 01/06/24  4:16 PM   Specimen: BLOOD LEFT HAND  Result Value Ref Range Status   Specimen Description   Final    BLOOD LEFT HAND Performed at [facility], [address], [place], [place] [address]    Special Requests   Final    BOTTLES DRAWN AEROBIC AND ANAEROBIC Blood Culture adequate volume Performed at [facility], [address] [place], [place] [address]    Culture PENDING  Incomplete   Report Status PENDING  Incomplete    Imaging reviewed:  CT ABDOMEN PELVIS W CONTRAST Result Date: 01/06/2024 CLINICAL DATA:  Urinary tract infection, recent change in antibiotics. Back pain/flank pain. EXAM: CT ABDOMEN AND PELVIS WITH CONTRAST TECHNIQUE: Multidetector CT imaging of the abdomen and pelvis was performed using the standard protocol following bolus  administration of intravenous contrast. RADIATION DOSE REDUCTION: This exam was performed according to the departmental dose-optimization program which includes automated exposure control, adjustment of the mA and/or kV according to patient size and/or use of iterative reconstruction technique. CONTRAST:  100mL OMNIPAQUE IOHEXOL 300 MG/ML  SOLN COMPARISON:  Lumbar radiographs 12/31/2023 and CT abdomen 02/06/2020 FINDINGS: The patient is contracted and rotated on today's exam with some resulting reduced diagnostic accuracy.  Lower chest: Coronary and descending thoracic aortic atherosclerosis. Distal esophageal wall thickening is nonspecific although esophagitis would be a common cause. Airway plugging in the right lower lobe with bandlike irregular right lower lobe opacity likely reflecting mild localized pneumonia. Lesser degree of airway plugging in the left lower lobe. Trace left pleural thickening posteriorly on image 16 series 2, nonspecific. Hepatobiliary: Intrahepatic biliary dilatation similar to previous without definite substantial extrahepatic biliary dilatation. Cavernous transformation of the portal vein. No definite focal liver mass identified. Pancreas: Heterogeneous density in the pancreas diffusely, pancreatitis is not excluded. Scattered calcifications likely reflect a chronic component of pancreatitis. Correlate with lipase levels. No obvious abscess or pseudocyst. Spleen: Unremarkable Adrenals/Urinary Tract: Fullness of the adrenal glands without discrete mass. Relative left renal atrophy. Indistinctly marginated parenchymal hypodensity in the right mid kidney on image 101 series 8 and also shown on image 32 series 2, suspicious for pyelonephritis. No hydronephrosis or hydroureter. 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 No  well-defined drainable abscess. No free air in the peritoneal cavity identified. Stomach/Bowel: Prominent stool throughout the colon favors constipation. Normal appendix. No imaging findings of pseudomembranous colitis. 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 No out right complete occlusion. Celiac trunk appears patent. Aortic and proximal renal artery atheromatous vascular calcifications. Reproductive: Unremarkable Other: No supplemental non-categorized findings. Musculoskeletal: Prior interbody fusion at T9-T10-T11. Scattered abnormal gas in the spinal canal extending from the T12 level through the L5 level, some peripheral and some central in distribution in the canal. Cannot exclude intradural gas or regional spinal canal abscess, although I do not see new definite bony destructive findings along the endplates to further indicate recurrent discitis. Lumbar MRI with and without contrast is recommended for further characterization. IMPRESSION: 1. 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 No well-defined drainable pelvic abscess. 2. Indistinctly marginated parenchymal hypodensity in the right mid kidney, suspicious for pyelonephritis. 3. Scattered abnormal gas in the spinal canal extending from the T12 level through the L5 level, some peripheral and some central in distribution in the canal. Cannot exclude intradural gas or regional spinal canal abscess, although I do not see new definite bony destructive findings along the endplates to further indicate recurrent discitis. Lumbar MRI with and without contrast is recommended for  further characterization, specifically to assess for spinal infection. 4. Heterogeneous density in the pancreas diffusely, pancreatitis is not excluded. Scattered calcifications likely reflect a chronic component of pancreatitis. Correlate with lipase levels. 5. Airway plugging in the right lower lobe with bandlike irregular right lower lobe opacity likely reflecting mild localized pneumonia. Lesser degree of airway plugging in the left lower lobe. 6. 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 7. Prominent stool throughout the colon favors constipation. 8. Coronary and descending thoracic aortic atherosclerosis. Aortic Atherosclerosis (ICD10-I70.0). 9. Distal esophageal wall thickening is nonspecific although esophagitis would be a common cause. Electronically Signed   By: Freida Jes M.D.   On: 01/06/2024 17:04     ED Course:  Imaging obtained above concerning  for emphysematous cystitis with a gas-forming infection spread into the pelvis as well as gas in the central canal.  EDP has discussed with neurosurgery who is recommending for MRI scan and they will consult at [facility]  ID recommended for antibiotic coverage with ceftriaxone and vancomycin.  I asked urology to be consulted and they will see the patient at [facility], recommending for Foley catheter for now, no other immediate intervention.  Was treated with vancomycin, ceftriaxone, 2 L IV fluid.  Multiple attempts to place Foley catheter patient refusing as of now.  Will attempt better pain control and reassess.   Assessment/Plan:  58 y.o. male with hx CVA with residual right-sided hemiplegia, Carotid artery disease s/p L carotid stent, SAH, IPH, On seizure prophylaxis, CAD with PCI/stent RCA, diabetes type 2, hx pancreatitis with pancreatic insufficiency, bilateral renal artery stenosis, hypertension, malnutrition, BPH, GERD, mood disorder, who was brought in from his skilled nursing facility  due to worsening lower back pain, UTI.   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  On initial evaluation tachycardic in the low 100s, afebrile.  On exam there are contractures of bilateral lower extremities with 0 out of 5 strength, and difficulty assessing lower extremity sensation. WBC 25, lactate 1.8.  CT abdomen pelvis demonstrating extensive emphysematous cystitis with mild extension of gas along the pelvic side wall and space of Retzius, with no drainable abscess; parenchymal hypodensity in the right mid kidney suspicious for pyelonephritis; gas within the spinal canal T12-L5, intradural gas and spinal canal abscess not ruled out. - Will send ED to ED at [facility] to facilitate expedited MRI - Neurosurgery consulted, recommending for MRI scan and they will consult at [facility].  Please contact them after MRI completed. - ID consulted, recommended for antibiotic coverage with ceftriaxone and vancomycin; I have broadened antibiotic per below.  Please contact ID for consult at [facility] in the morning - Urology consulted, they will see the patient at [facility], recommending for Foley catheter for now, no other immediate intervention.  Please contact when he is arrived at [facility].  - Continue vancomycin pharmacy to dose, change ceftriaxone to cefepime 2 g IV every 8 hour, add Flagyl 500 mg IV every 12 hours - S/p 2 L IV fluid.  Continue maintenance fluids - MRI C/T/L spine with contrast stat ordered, to be done at [facility] (no MRI availability at [facility] overnight) - Has refused Foley catheter so far, will attempt with improved pain control - Follow-up blood cultures, urine culture -Symptomatic management Tylenol  as needed mild, oxycodone 2.5/5 mg every 4 hours for moderate/severe, Dilaudid 0.5 mg IV every 4 hours for breakthrough.  Paraplegia, ?  Subacute onset With prior admission in 11/' 24 documented as having intact bilateral lower extremity strength.  Did have reduced strength in the left hand and in the right upper extremity.  On my initial exam he has contractures, hypertonia and 0 out of 5 strength in bilateral lower extremities with difficulty assessing and sensation.  Concerned that paraplegia may be related to spread of infection to spinal canal.  - Evaluation with MRI per above, neurosurgery involvement - Turn patient every 2 hours - Urinary management with Foley catheter.  If refuses would attempt intermittent  straight cath - Bowel regimen MiraLAX as needed, senna nightly, bisacodyl suppository as needed  Wide complex Tachycardia,  Developed WCT with rate in 210s, question SVT with abberancy. Suspect ischemic change associated with diffuse ST depression. Terminated with vagal maneuvers. Post arrythmia tele appears ST with frequent ectopy.  -Repeat EKG, check troponin, check lytes  -He is on metoprolol succinate 100 mg at home.  Resume tartrate 50 mg twice daily with first dose now. -Will have cardiology review to ensure no other recs for management.  DM 2 with uncontrolled hyperglycemia Blood glucose 300s to 400s.  In the setting of his severe infection prefer for tight glycemic control.  Home regimen is glargine 27 units at bedtime, lispro on sliding scale, metformin.  -Started on insulin drip for tight glycemic control, once stabilized can transition over to basal bolus insulin.  Anion gap metabolic acidosis -Repeat lactate -IV fluids per above  Question of pancreatitis CT with heterogenous appearance of pancreas - Check lipase  Airway plugging right lower lobe, possible postobstructive pneumonia CT with findings above.  Suspect mucous plugging in the setting of his positioning in  the right lateral decubitus position, question of pneumonia although no significant symptoms per his report.  Currently on room air. - Coverage with antibiotics per above - Albuterol every 4 hours as needed, I-S, flutter valve, chest PT  Question of acute liver injury AST elevated at 98, alk phos 191.  Suspect alk phos from bone source due to his bedbound status.  The unclear source. - Check CK  Chronic medical problems: History of CVA, right hemiplegia: Hold DAPT in setting of possible spinal infection or need for neurosurgical intervention. CAD with PCI to RCA: See above holding DAPT.  Continue his rosuvastatin. History SAH, IPH, on seizure prophylaxis: Continue home Keppra.  Unclear if he is still supposed to be maintained on Keppra prophylaxis. History of pancreatitis with pancreatic insufficiency: Continue home Creon Hypertension: On metoprolol per above. BPH: Continue home tamsulosin Malnutrition: Once he is stabilized can involve RD, not contacted yet GERD: Home PPI, famotidine. Anxiety: Continue home BuSpar, trazodone   There is no height or weight on file to calculate BMI.    DVT prophylaxis:  SCDs Code Status:  Full Code Diet:  Diet Orders (From admission, onward)     Start     Ordered   01/06/24 2054  Diet NPO time specified Except for: Ice Chips, Sips with Meds  Diet effective now       Question Answer Comment  Except for Ice Chips   Except for Sips with Meds      01/06/24 2054           Family Communication:  No   Consults:  Neurosurgery, ID, Urology   Admission status:   Inpatient, Step Down Unit  Severity of Illness: The appropriate patient status for this patient is INPATIENT. Inpatient status is judged to be reasonable and necessary in order to provide the required intensity of service to ensure the patient's safety. The patient's presenting symptoms, physical exam findings, and initial radiographic and laboratory data in the context of their chronic  comorbidities is felt to place them at high risk for further clinical deterioration. Furthermore, it is not anticipated that the patient will be medically stable for discharge from the hospital within 2 midnights of admission.   * I certify that at the point of admission it is my clinical judgment that the patient will require inpatient hospital care spanning beyond 2 midnights from the  point of admission due to high intensity of service, high risk for further deterioration and high frequency of surveillance required.*   Arnulfo Larch, MD [facility]  How to contact the [facility] Attending or Consulting provider 7A - 7P or covering provider during after hours 7P -7A, for this patient.  Check the care team in [facility] and look for a) attending/consulting [facility] provider listed and b) the [facility] team listed Log into [email or web address] and use [facility] universal password to access. If you do not have the password, please contact the hospital operator. Locate the [facility] provider you are looking for under [facility] and page to a number that you can be directly reached. If you still have difficulty reaching the provider, please page the [facility] (Director on Call) for the Hospitalists listed on amion for assistance.  01/06/2024, 9:33 PM

## 2024-01-06 NOTE — ED Notes (Signed)
 Pt keeps requesting to be pulled up in bed and laid on his back but his legs are so constricted that is not allowing him to lay on his back. After many attempts to readjust the pt the pt said it would be his last time asking. Pt resting comfortably.

## 2024-01-06 NOTE — Progress Notes (Signed)
 Pharmacy Antibiotic Note  Brandon [name] is a 58 y.o. male admitted on 01/06/2024 with pyelonephritis. CTa/p reveals scattered gas in spinal canal (T12-L5) possibly suggesting infectious process. Pharmacy has been consulted for vancomycin dosing.  Plan: Vancomycin 1000 mg IV q12 hr (est AUC 549 based on SCr 0.8; Vd 0.72) Measure vancomycin AUC at steady state as indicated SCr q48 while on vanc Cefepime & Flagyl per MD; dosing appropriate     Temp (24hrs), Avg:98 F (36.7 C), Min:97.6 F (36.4 C), Max:98.4 F (36.9 C)  Recent Labs  Lab 01/06/24 1214 01/06/24 1325  WBC 25.3*  --   CREATININE 0.78  --   LATICACIDVEN  --  1.8    Estimated Creatinine Clearance: 90.6 mL/min (by C-G formula based on SCr of 0.78 mg/dL).    No Known Allergies  Antimicrobials this admission: 4/15 vancomycin >>  4/15 Rocephin x 1 >> cefepime >>  4/15 Flagyl >>   Dose adjustments this admission: N/a  Microbiology results: 4/15 BCx: sent 4/15 UCx: sent    Thank you for allowing pharmacy to be a part of this patient's care.  Manessa Buley A 01/06/2024 6:40 PM

## 2024-01-06 NOTE — ED Notes (Signed)
 Patient transported to CT

## 2024-01-06 NOTE — ED Notes (Signed)
 Attempt was made to in and out cath the patient to obtain a urine sample. He refused.

## 2024-01-06 NOTE — ED Notes (Signed)
 Pt continues to refuse foley catheter, despite myself, RN informing pt the importance of it.

## 2024-01-06 NOTE — ED Notes (Signed)
 [facility] called for ED to ED transfer from [facility] to [facility], Dr. Delana Favors accepting.

## 2024-01-06 NOTE — ED Notes (Addendum)
 This tech attemted to get a urine sample with urinal when this tech removed brief it was found saturated with blood, puss and urine. This tech and RN Sonya cleaned and put a condom catheter on as well as a new brief. Pt is resting comfortably.

## 2024-01-06 NOTE — ED Notes (Signed)
 Staff have attempted multiple times to turn the patient. Patient has not tolerated attempts.

## 2024-01-07 ENCOUNTER — Inpatient Hospital Stay (HOSPITAL_COMMUNITY)

## 2024-01-07 DIAGNOSIS — I2510 Atherosclerotic heart disease of native coronary artery without angina pectoris: Secondary | ICD-10-CM | POA: Diagnosis not present

## 2024-01-07 DIAGNOSIS — M4645 Discitis, unspecified, thoracolumbar region: Secondary | ICD-10-CM

## 2024-01-07 DIAGNOSIS — N10 Acute tubulo-interstitial nephritis: Secondary | ICD-10-CM | POA: Diagnosis not present

## 2024-01-07 DIAGNOSIS — G062 Extradural and subdural abscess, unspecified: Secondary | ICD-10-CM | POA: Diagnosis not present

## 2024-01-07 DIAGNOSIS — I471 Supraventricular tachycardia, unspecified: Secondary | ICD-10-CM

## 2024-01-07 DIAGNOSIS — R7881 Bacteremia: Secondary | ICD-10-CM

## 2024-01-07 DIAGNOSIS — Z1612 Extended spectrum beta lactamase (ESBL) resistance: Secondary | ICD-10-CM

## 2024-01-07 DIAGNOSIS — A419 Sepsis, unspecified organism: Secondary | ICD-10-CM | POA: Diagnosis not present

## 2024-01-07 DIAGNOSIS — E1165 Type 2 diabetes mellitus with hyperglycemia: Secondary | ICD-10-CM | POA: Diagnosis not present

## 2024-01-07 DIAGNOSIS — M4620 Osteomyelitis of vertebra, site unspecified: Secondary | ICD-10-CM | POA: Diagnosis not present

## 2024-01-07 DIAGNOSIS — R739 Hyperglycemia, unspecified: Secondary | ICD-10-CM

## 2024-01-07 DIAGNOSIS — A498 Other bacterial infections of unspecified site: Secondary | ICD-10-CM

## 2024-01-07 DIAGNOSIS — N3080 Other cystitis without hematuria: Secondary | ICD-10-CM

## 2024-01-07 LAB — BLOOD CULTURE ID PANEL (REFLEXED) - BCID2
A.calcoaceticus-baumannii: NOT DETECTED
A.calcoaceticus-baumannii: NOT DETECTED
Bacteroides fragilis: NOT DETECTED
Bacteroides fragilis: NOT DETECTED
CTX-M ESBL: DETECTED — AB
Candida albicans: NOT DETECTED
Candida albicans: NOT DETECTED
Candida auris: NOT DETECTED
Candida auris: NOT DETECTED
Candida glabrata: NOT DETECTED
Candida glabrata: NOT DETECTED
Candida krusei: NOT DETECTED
Candida krusei: NOT DETECTED
Candida parapsilosis: NOT DETECTED
Candida parapsilosis: NOT DETECTED
Candida tropicalis: NOT DETECTED
Candida tropicalis: NOT DETECTED
Carbapenem resist OXA 48 LIKE: NOT DETECTED
Carbapenem resistance IMP: NOT DETECTED
Carbapenem resistance KPC: NOT DETECTED
Carbapenem resistance NDM: NOT DETECTED
Carbapenem resistance VIM: NOT DETECTED
Cryptococcus neoformans/gattii: NOT DETECTED
Cryptococcus neoformans/gattii: NOT DETECTED
Enterobacter cloacae complex: NOT DETECTED
Enterobacter cloacae complex: NOT DETECTED
Enterobacterales: DETECTED — AB
Enterobacterales: NOT DETECTED
Enterococcus Faecium: NOT DETECTED
Enterococcus Faecium: NOT DETECTED
Enterococcus faecalis: NOT DETECTED
Enterococcus faecalis: NOT DETECTED
Escherichia coli: NOT DETECTED
Escherichia coli: NOT DETECTED
Haemophilus influenzae: NOT DETECTED
Haemophilus influenzae: NOT DETECTED
Klebsiella aerogenes: NOT DETECTED
Klebsiella aerogenes: NOT DETECTED
Klebsiella oxytoca: NOT DETECTED
Klebsiella oxytoca: NOT DETECTED
Klebsiella pneumoniae: DETECTED — AB
Klebsiella pneumoniae: NOT DETECTED
Listeria monocytogenes: NOT DETECTED
Listeria monocytogenes: NOT DETECTED
Neisseria meningitidis: NOT DETECTED
Neisseria meningitidis: NOT DETECTED
Proteus species: NOT DETECTED
Proteus species: NOT DETECTED
Pseudomonas aeruginosa: NOT DETECTED
Pseudomonas aeruginosa: NOT DETECTED
Salmonella species: NOT DETECTED
Salmonella species: NOT DETECTED
Serratia marcescens: NOT DETECTED
Serratia marcescens: NOT DETECTED
Staphylococcus aureus (BCID): NOT DETECTED
Staphylococcus aureus (BCID): NOT DETECTED
Staphylococcus epidermidis: NOT DETECTED
Staphylococcus epidermidis: NOT DETECTED
Staphylococcus lugdunensis: NOT DETECTED
Staphylococcus lugdunensis: NOT DETECTED
Staphylococcus species: NOT DETECTED
Staphylococcus species: NOT DETECTED
Stenotrophomonas maltophilia: NOT DETECTED
Stenotrophomonas maltophilia: NOT DETECTED
Streptococcus agalactiae: NOT DETECTED
Streptococcus agalactiae: NOT DETECTED
Streptococcus pneumoniae: NOT DETECTED
Streptococcus pneumoniae: DETECTED — AB
Streptococcus pyogenes: NOT DETECTED
Streptococcus pyogenes: NOT DETECTED
Streptococcus species: NOT DETECTED
Streptococcus species: DETECTED — AB

## 2024-01-07 LAB — CBC
HCT: 29.3 % — ABNORMAL LOW (ref 39.0–52.0)
Hemoglobin: 9.8 g/dL — ABNORMAL LOW (ref 13.0–17.0)
MCH: 30.5 pg (ref 26.0–34.0)
MCHC: 33.4 g/dL (ref 30.0–36.0)
MCV: 91.3 fL (ref 80.0–100.0)
Platelets: 436 10*3/uL — ABNORMAL HIGH (ref 150–400)
RBC: 3.21 MIL/uL — ABNORMAL LOW (ref 4.22–5.81)
RDW: 14.6 % (ref 11.5–15.5)
WBC: 26 10*3/uL — ABNORMAL HIGH (ref 4.0–10.5)
nRBC: 0 % (ref 0.0–0.2)

## 2024-01-07 LAB — BASIC METABOLIC PANEL WITH GFR
Anion gap: 14 (ref 5–15)
BUN: 19 mg/dL (ref 6–20)
CO2: 21 mmol/L — ABNORMAL LOW (ref 22–32)
Calcium: 8.2 mg/dL — ABNORMAL LOW (ref 8.9–10.3)
Chloride: 95 mmol/L — ABNORMAL LOW (ref 98–111)
Creatinine, Ser: 0.55 mg/dL — ABNORMAL LOW (ref 0.61–1.24)
GFR, Estimated: >60 mL/min (ref >60)
Glucose, Bld: 241 mg/dL — ABNORMAL HIGH (ref 70–99)
Potassium: 3.1 mmol/L — ABNORMAL LOW (ref 3.5–5.1)
Sodium: 130 mmol/L — ABNORMAL LOW (ref 135–145)

## 2024-01-07 LAB — CBG MONITORING, ED
Glucose-Capillary: 107 mg/dL — ABNORMAL HIGH (ref 70–99)
Glucose-Capillary: 143 mg/dL — ABNORMAL HIGH (ref 70–99)
Glucose-Capillary: 144 mg/dL — ABNORMAL HIGH (ref 70–99)
Glucose-Capillary: 146 mg/dL — ABNORMAL HIGH (ref 70–99)
Glucose-Capillary: 148 mg/dL — ABNORMAL HIGH (ref 70–99)
Glucose-Capillary: 148 mg/dL — ABNORMAL HIGH (ref 70–99)
Glucose-Capillary: 149 mg/dL — ABNORMAL HIGH (ref 70–99)
Glucose-Capillary: 153 mg/dL — ABNORMAL HIGH (ref 70–99)
Glucose-Capillary: 154 mg/dL — ABNORMAL HIGH (ref 70–99)
Glucose-Capillary: 155 mg/dL — ABNORMAL HIGH (ref 70–99)
Glucose-Capillary: 157 mg/dL — ABNORMAL HIGH (ref 70–99)
Glucose-Capillary: 162 mg/dL — ABNORMAL HIGH (ref 70–99)
Glucose-Capillary: 182 mg/dL — ABNORMAL HIGH (ref 70–99)
Glucose-Capillary: 187 mg/dL — ABNORMAL HIGH (ref 70–99)
Glucose-Capillary: 209 mg/dL — ABNORMAL HIGH (ref 70–99)
Glucose-Capillary: 213 mg/dL — ABNORMAL HIGH (ref 70–99)

## 2024-01-07 LAB — HEMOGLOBIN A1C
Hgb A1c MFr Bld: 9.4 % — ABNORMAL HIGH (ref 4.8–5.6)
Mean Plasma Glucose: 223.08 mg/dL

## 2024-01-07 LAB — GLUCOSE, CAPILLARY: Glucose-Capillary: 209 mg/dL — ABNORMAL HIGH (ref 70–99)

## 2024-01-07 LAB — TROPONIN I (HIGH SENSITIVITY): Troponin I (High Sensitivity): 109 ng/L (ref <18)

## 2024-01-07 LAB — PHOSPHORUS: Phosphorus: 2.4 mg/dL — ABNORMAL LOW (ref 2.5–4.6)

## 2024-01-07 LAB — PROTIME-INR
INR: 1.5 — ABNORMAL HIGH (ref 0.8–1.2)
Prothrombin Time: 18 s — ABNORMAL HIGH (ref 11.4–15.2)

## 2024-01-07 LAB — MAGNESIUM: Magnesium: 2.2 mg/dL (ref 1.7–2.4)

## 2024-01-07 MED ORDER — SODIUM CHLORIDE 0.9 % IV SOLN
1.0000 g | Freq: Three times a day (TID) | INTRAVENOUS | Status: DC
  Administered 2024-01-07 – 2024-01-21 (×43): 1 g via INTRAVENOUS
  Filled 2024-01-07 (×45): qty 20

## 2024-01-07 MED ORDER — INSULIN ASPART 100 UNIT/ML IJ SOLN
0.0000 [IU] | Freq: Three times a day (TID) | INTRAMUSCULAR | Status: DC
  Administered 2024-01-07: 1 [IU] via SUBCUTANEOUS
  Administered 2024-01-08 (×3): 3 [IU] via SUBCUTANEOUS
  Administered 2024-01-09: 7 [IU] via SUBCUTANEOUS

## 2024-01-07 MED ORDER — METOPROLOL TARTRATE 5 MG/5ML IV SOLN
5.0000 mg | Freq: Once | INTRAVENOUS | Status: AC
  Administered 2024-01-07: 5 mg via INTRAVENOUS
  Filled 2024-01-07: qty 5

## 2024-01-07 MED ORDER — GADOBUTROL 1 MMOL/ML IV SOLN
7.0000 mL | Freq: Once | INTRAVENOUS | Status: AC | PRN
  Administered 2024-01-07: 7 mL via INTRAVENOUS

## 2024-01-07 MED ORDER — INSULIN GLARGINE-YFGN 100 UNIT/ML ~~LOC~~ SOLN
10.0000 [IU] | Freq: Every day | SUBCUTANEOUS | Status: DC
Start: 2024-01-07 — End: 2024-01-08
  Administered 2024-01-07 – 2024-01-08 (×2): 10 [IU] via SUBCUTANEOUS
  Filled 2024-01-07 (×2): qty 0.1

## 2024-01-07 MED ORDER — LORAZEPAM 2 MG/ML IJ SOLN
0.2500 mg | Freq: Once | INTRAMUSCULAR | Status: AC
  Administered 2024-01-07: 0.25 mg via INTRAVENOUS
  Filled 2024-01-07: qty 1

## 2024-01-07 MED ORDER — POTASSIUM CHLORIDE 10 MEQ/100ML IV SOLN
10.0000 meq | INTRAVENOUS | Status: AC
  Administered 2024-01-07 (×2): 10 meq via INTRAVENOUS
  Filled 2024-01-07 (×2): qty 100

## 2024-01-07 MED ORDER — ACETAMINOPHEN 10 MG/ML IV SOLN
1000.0000 mg | Freq: Once | INTRAVENOUS | Status: AC
  Administered 2024-01-07: 1000 mg via INTRAVENOUS
  Filled 2024-01-07: qty 100

## 2024-01-07 MED ORDER — LORAZEPAM 1 MG PO TABS: 1.0000 mg | ORAL_TABLET | Freq: Once | ORAL | Status: DC | PRN

## 2024-01-07 NOTE — Progress Notes (Signed)
 CSW spoke with Tammy at [facility] who states patient is from LTC at the facility and can return once medically stable.  Current plan is for patient to be admitted for medical workup.  TOC will continue to follow for discharge planning needs.  Shepard Dicker, MSW, LCSW Transitions of Care  Clinical Social Worker II [phone]

## 2024-01-07 NOTE — ED Notes (Signed)
 1,048mL of urine output

## 2024-01-07 NOTE — Progress Notes (Signed)
    Patient seen this morning in consultation.  [facility] signed off.  Please call with further questions.

## 2024-01-07 NOTE — ED Notes (Signed)
 Awaiting Meropenem from main pharmacy to administer.

## 2024-01-07 NOTE — Progress Notes (Addendum)
 PHARMACY - PHYSICIAN COMMUNICATION CRITICAL VALUE ALERT - BLOOD CULTURE IDENTIFICATION (BCID)  Brandon [name] is an 57 y.o. male who presented to [facility] on 01/06/2024 with a chief complaint of UTI and spinal abscess  Assessment:  Pt now with 2/4 bottles growing strep pneumo. Noted previous cx from same day 4 hours earlier grew ESBL kleb pneumo  Name of physician (or Provider) Contacted: Dr. Geni Keto  Current antibiotics: Meropenem  Changes to prescribed antibiotics recommended:  No additional abx needed. ID following.  Results for orders placed or performed during the hospital encounter of 01/06/24  Blood Culture ID Panel (Reflexed) (Collected: 01/06/2024  4:16 PM)  Result Value Ref Range   Enterococcus faecalis NOT DETECTED NOT DETECTED   Enterococcus Faecium NOT DETECTED NOT DETECTED   Listeria monocytogenes NOT DETECTED NOT DETECTED   Staphylococcus species NOT DETECTED NOT DETECTED   Staphylococcus aureus (BCID) NOT DETECTED NOT DETECTED   Staphylococcus epidermidis NOT DETECTED NOT DETECTED   Staphylococcus lugdunensis NOT DETECTED NOT DETECTED   Streptococcus species DETECTED (A) NOT DETECTED   Streptococcus agalactiae NOT DETECTED NOT DETECTED   Streptococcus pneumoniae DETECTED (A) NOT DETECTED   Streptococcus pyogenes NOT DETECTED NOT DETECTED   A.calcoaceticus-baumannii NOT DETECTED NOT DETECTED   Bacteroides fragilis NOT DETECTED NOT DETECTED   Enterobacterales NOT DETECTED NOT DETECTED   Enterobacter cloacae complex NOT DETECTED NOT DETECTED   Escherichia coli NOT DETECTED NOT DETECTED   Klebsiella aerogenes NOT DETECTED NOT DETECTED   Klebsiella oxytoca NOT DETECTED NOT DETECTED   Klebsiella pneumoniae NOT DETECTED NOT DETECTED   Proteus species NOT DETECTED NOT DETECTED   Salmonella species NOT DETECTED NOT DETECTED   Serratia marcescens NOT DETECTED NOT DETECTED   Haemophilus influenzae NOT DETECTED NOT DETECTED   Neisseria meningitidis NOT DETECTED NOT  DETECTED   Pseudomonas aeruginosa NOT DETECTED NOT DETECTED   Stenotrophomonas maltophilia NOT DETECTED NOT DETECTED   Candida albicans NOT DETECTED NOT DETECTED   Candida auris NOT DETECTED NOT DETECTED   Candida glabrata NOT DETECTED NOT DETECTED   Candida krusei NOT DETECTED NOT DETECTED   Candida parapsilosis NOT DETECTED NOT DETECTED   Candida tropicalis NOT DETECTED NOT DETECTED   Cryptococcus neoformans/gattii NOT DETECTED NOT DETECTED    Enrigue Harvard, PharmD, BCPS Please see amion for complete clinical pharmacist phone list 01/07/2024  8:23 PM

## 2024-01-07 NOTE — Significant Event (Signed)
 I was notified about the patient's MRI results of the spine -     Epidural abscess spanning T10-11 to L5-S1, primarily posterior canal in the upper extent and in the anterior canal at the lower extent. At the level of L3, ventral abscess measures up to 7 mm in thickness and contains gas. There is some disc edematous signal at L4-5 and L5-S1, but no clear discitis, suspect direct paravertebral seeding from UTI based on prior abdominal CT. The lower cord and cauda equina is diffusely compressed by the collection.  I reviewed patient's labs medications and notes.  I examined the patient at bedside.  Patient is already on empiric antibiotics.  I consulted on-call neurosurgeon Dr. Ellery Guthrie who will be seeing patient in consult.  Melford Spotted.

## 2024-01-07 NOTE — Progress Notes (Signed)
 Progress Note   Patient: Brandon [name] JWJ:[id number] DOB: Dec 21, 1965 DOA: 01/06/2024     1 DOS: the patient was seen and examined on 01/07/2024   Brief hospital course: 58 y.o. male with hx of CVA with residual right-sided hemiplegia, Carotid artery disease s/p L carotid stent, SAH, IPH, On seizure prophylaxis, CAD with PCI/stent RCA, diabetes type 2, hx pancreatitis with pancreatic insufficiency, bilateral renal artery stenosis, hypertension, malnutrition, BPH, GERD, mood disorder, who was brought in from his skilled nursing facility due to worsening lower back pain.  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  Assessment and Plan:  Sepsis with ESBL Klebsiella - Leukocytosis, tachycardia, known emphysematous UTI and spinal abscess given concern for sepsis.  Broad-spectrum antibiotics, blood cultures, IV fluid bolus initiated.  Initial blood cultures noting ESBL Klebsiella pneumoniae.  Meropenem initiated.  Monitor blood pressure closely.  Will repeat CBC, monitor/trend leukocytosis.  Epidural spinal abscess - MRI noting abscess spanning T10-11 through L5-S1.  Evaluated by neurosurgery.  Given patient's multiple comorbidities, poor surgical candidate at this time.  Awaiting to evaluate via initial conservative antibiotic treatment.  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  Likely etiology of patient's spinal abscess.  UA noting LE, nitrates, WBCs, bacteria (interestingly notes blue color).  UA on examination does not appear blue however.  Urology consulted.  Recommending Foley catheter however patient continues to refuse.  Broad-spectrum antibiotics on board.  Acute metabolic encephalopathy - Secondary to above.  Patient arousable to name and stimulus but  intermittently confused.  Continue to monitor closely.  Possible acute paraplegia with history of CVA and right-sided weakness - Possible exacerbation through epidural abscess and cord compression.  Neurosurgery as above.  SVT with aberrancy - ECG on presentation noting narrow complex tachycardia with a rate greater than 200.  Evaluated by cardiology.  Appears to show improvement after vagal maneuver, IV metoprolol, IV fluids.  Continues on telemetry.  Echo pending.  CAD - No chest pain currently.  Hold aspirin, Plavix for now.  Diabetes mellitus - Initial glucose 300-400 range.  Initiated on insulin drip, plan to wean to basal/bolus insulin with sliding scale.  Possible pancreatitis - Noted on CT scan.  Could be sequela of infection above.  Possible mucous plugging - Patient with no significant symptoms, currently on room air.  Chest PT, incentive spirometry, albuterol as needed.       Subjective: Patient evaluated this morning, appearing encephalopathic.  Arousable and answering questions, denies shortness of breath, admits to back pain and lower extremity weakness.  A&O x 1-2.  Refusing Foley catheter.  Complaining of being uncomfortable in the bed.  Physical Exam: Vitals:   01/07/24 0756 01/07/24 1000 01/07/24 1030 01/07/24 1215  BP: 129/85 135/72 120/78 125/80  Pulse: 78 79  83  Resp: 17 16  17   Temp: 97.6 F (36.4 C) 97.6 F (36.4 C)  97.6 F (36.4 C)  TempSrc: Oral Oral  Oral  SpO2: 97% 100%  98%   GENERAL:  Alert, disheveled, ill-appearing HEENT:  EOMI CARDIOVASCULAR: Regular and tachycardic RESPIRATORY:  Clear to auscultation, no wheezing, rales, or rhonchi GASTROINTESTINAL:  Soft, minimally diffusely EXTREMITIES:  No LE edema bilaterally NEURO: Lower extremity flaccid weakness  SKIN: Scattered ecchymosis, dry PSYCH:  Appropriate mood and affect   Data Reviewed:  There are no new results to review  at this time.  Family Communication: None at  bedside  Disposition: Status is: Inpatient Remains inpatient appropriate because: Epidural abscess sepsis  Planned Discharge Destination: Skilled nursing facility    Time spent: 36 minutes  Author: Jodeane Mulligan, DO 01/07/2024 1:26 PM  For on call review [email or web address].

## 2024-01-07 NOTE — Progress Notes (Signed)
   01/07/24 2332  Vitals  Temp (!) 101.1 F (38.4 C)  Temp Source Axillary  BP (!) 141/94  MAP (mmHg) 106  BP Location Left Arm  BP Method Automatic  Patient Position (if appropriate) Lying  Pulse Rate (!) 122  Pulse Rate Source Monitor  ECG Heart Rate (!) 122  Resp 16  Level of Consciousness  Level of Consciousness Alert  MEWS COLOR  MEWS Score Color Yellow  Oxygen Therapy  SpO2 93 %  O2 Device Room Air  MEWS Score  MEWS Temp 1  MEWS Systolic 0  MEWS Pulse 2  MEWS RR 0  MEWS LOC 0  MEWS Score 3  Provider Notification  Provider Name/Title Dr [facility]  Date Provider Notified 01/07/24  Time Provider Notified 2332  Method of Notification Page (secure chat)  Notification Reason Other (Comment) (increase HR and temp)  Provider response At bedside  Date of Provider Response 01/07/24  Time of Provider Response 2339

## 2024-01-07 NOTE — Consult Note (Signed)
 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 Geoffery Spruce Brandon [name] is an 58 y.o. male.  HPI: Patient is a 58 year old individual who was brought from nursing home where he was having severe pain in his back and his abdomen.  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  Suspect that he had an epidural abscess.  The patient's white count has been elevated to 25,000.  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  The patient has evidence of previous infection in the thoracic spine at the levels of T9-T10-T11 which seems to have fused after discitis and that level.  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  He has not been ambulatory he states for at least several months.  He is complaining of extreme pain to any touch or movement of the left side of his body from about mid abdomen down into the lower extremity.    Past Medical History:  Diagnosis Date   Alcohol dependency (HCC)    Hx ETOH withdrawal seizures before 2011   CAD (coronary artery disease) 06/13/2012   Calcification noted on CTA of chest in 2012 Wall motion abnormality on ECHO     Carotid artery disease (HCC) 2016   bilateral.  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    Depression with anxiety    Diabetes mellitus without complication (HCC)    GERD (gastroesophageal reflux disease)    Headache(784.0)    migraine   Heart disease 02/2015   PCI/DES placed to RCA: on chronic Plavix/ASA   Hyperlipidemia    Hypertension    Pancreatitis 2011....    CT  findings in May 2011 with inflammation and pseudocyst.  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/2016   Renal artery stenosis, native, bilateral (HCC) 02/2015    Past Surgical History:  Procedure Laterality Date   CARDIAC CATHETERIZATION N/A 03/15/2015   Procedure: Left Heart Cath;  Surgeon: Laurier Nancy, MD;  Location: [facility] INVASIVE CV LAB;  Service: Cardiovascular;  Laterality: N/A;   CARDIAC CATHETERIZATION N/A 03/16/2015   Procedure: Coronary Stent Intervention;  Surgeon: Alwyn Pea, MD;  Location: [facility] INVASIVE CV LAB;  Service: Cardiovascular;  Laterality: N/A;   CAROTID ANGIOGRAM N/A 06/15/2012   Procedure: CAROTID ANGIOGRAM;  Surgeon: Chuck Hint, MD;  Location: [facility] CATH LAB;  Service: Cardiovascular;  Laterality: N/A;   ESOPHAGOGASTRODUODENOSCOPY N/A 03/08/2016   Procedure: ESOPHAGOGASTRODUODENOSCOPY (EGD);  Surgeon: Ruffin Frederick, MD;  Location: [facility] ENDOSCOPY;  Service: Gastroenterology;  Laterality: N/A;   none     PERIPHERAL VASCULAR CATHETERIZATION Left 04/06/2015   Procedure: Carotid PTA/Stent Intervention;  Surgeon: Annice Needy, MD;  Location: [facility] INVASIVE CV LAB;  Service: Cardiovascular;  Laterality: Left;    Family History  Problem Relation Age of Onset   Stroke Mother        deceased   Coronary artery disease Mother    Alcohol abuse Mother    Cancer Mother    Hypertension Father        alive   Alcohol abuse Father    Diabetes  Father    Kidney disease Father    Stroke Maternal Grandmother     Social History:  reports that he has been smoking cigarettes. He has a 30 pack-year smoking history. He has never used smokeless tobacco. He reports that he does not drink alcohol and does not use drugs.  Allergies: No Known Allergies  Medications: Med list is not available.  Results for orders placed or performed during the hospital encounter of 01/06/24 (from the past 48 hours)  Comprehensive metabolic panel     Status: Abnormal   Collection Time: 01/06/24  12:14 PM  Result Value Ref Range   Sodium 129 (L) 135 - 145 mmol/L   Potassium 4.1 3.5 - 5.1 mmol/L   Chloride 92 (L) 98 - 111 mmol/L   CO2 21 (L) 22 - 32 mmol/L   Glucose, Bld 307 (H) 70 - 99 mg/dL    Comment: Glucose reference range applies only to samples taken after fasting for at least 8 hours.   BUN 31 (H) 6 - 20 mg/dL   Creatinine, Ser 8.11 0.61 - 1.24 mg/dL   Calcium 8.7 (L) 8.9 - 10.3 mg/dL   Total Protein 7.5 6.5 - 8.1 g/dL   Albumin 2.3 (L) 3.5 - 5.0 g/dL   AST 90 (H) 15 - 41 U/L   ALT 41 0 - 44 U/L   Alkaline Phosphatase 191 (H) 38 - 126 U/L   Total Bilirubin 1.2 0.0 - 1.2 mg/dL   GFR, Estimated >91 >47 mL/min    Comment: (NOTE) Calculated using the CKD-EPI Creatinine Equation (2021)    Anion gap 16 (H) 5 - 15    Comment: Performed at [facility], [address], [place], [place] [address]  CBC with Differential     Status: Abnormal   Collection Time: 01/06/24 12:14 PM  Result Value Ref Range   WBC 25.3 (H) 4.0 - 10.5 K/uL   RBC 3.80 (L) 4.22 - 5.81 MIL/uL   Hemoglobin 11.4 (L) 13.0 - 17.0 g/dL   HCT 21.3 (L) 08.6 - 57.8 %   MCV 92.6 80.0 - 100.0 fL   MCH 30.0 26.0 - 34.0 pg   MCHC 32.4 30.0 - 36.0 g/dL   RDW 46.9 62.9 - 52.8 %   Platelets PLATELET CLUMPS NOTED ON SMEAR, UNABLE TO ESTIMATE 150 - 400 K/uL    Comment: CORRECTED RESULTS CALLED TO: NOTIFIED LAND, A 1328 01/06/24 BY JE CORRECTED ON 04/15 AT 1329: PREVIOUSLY REPORTED AS 477    nRBC 0.0 0.0 - 0.2 %   Neutrophils Relative % 91 %   Neutro Abs 23.1 (H) 1.7 - 7.7 K/uL   Lymphocytes Relative 3 %   Lymphs Abs 0.8 0.7 - 4.0 K/uL   Monocytes Relative 5 %   Monocytes Absolute 1.2 (H) 0.1 - 1.0 K/uL   Eosinophils Relative 0 %   Eosinophils Absolute 0.0 0.0 - 0.5 K/uL   Basophils Relative 0 %   Basophils Absolute 0.0 0.0 - 0.1 K/uL   WBC Morphology MORPHOLOGY UNREMARKABLE    RBC Morphology MORPHOLOGY UNREMARKABLE    Smear Review PLATELETS APPEAR INCREASED    Immature Granulocytes 1 %    Abs Immature Granulocytes 0.23 (H) 0.00 - 0.07 K/uL    Comment: Performed at [facility], [address] [place], [place] [address]  Blood culture (routine x 2)     Status: None (Preliminary result)   Collection Time: 01/06/24  1:14 PM   Specimen: BLOOD  Result Value Ref  Range   Specimen Description      BLOOD SITE NOT SPECIFIED Performed at [facility], [address], [place], [place] [address]    Special Requests      BOTTLES DRAWN AEROBIC AND ANAEROBIC Blood Culture adequate volume Performed at [facility], [address], [place], [place] [address]    Culture  Setup Time      GRAM NEGATIVE RODS ANAEROBIC BOTTLE ONLY Organism ID to follow Performed at [facility], [address], [place], [place] [address]    Culture GRAM NEGATIVE RODS    Report Status PENDING   I-Stat CG4 Lactic Acid     Status: None   Collection Time: 01/06/24  1:25 PM  Result Value Ref Range   Lactic Acid, Venous 1.8 0.5 - 1.9 mmol/L  Urinalysis, w/ Reflex to Culture (Infection Suspected) -Urine, Clean Catch     Status: Abnormal   Collection Time: 01/06/24  4:15 PM  Result Value Ref Range   Specimen Source URINE, CLEAN CATCH    Color, Urine BLUE (A) YELLOW    Comment: BIOCHEMICALS MAY BE AFFECTED BY COLOR   APPearance TURBID (A) CLEAR   Specific Gravity, Urine 1.027 1.005 - 1.030   pH 5.0 5.0 - 8.0   Glucose, UA 150 (A) NEGATIVE mg/dL   Hgb urine dipstick MODERATE (A) NEGATIVE   Bilirubin Urine NEGATIVE NEGATIVE   Ketones, ur 5 (A) NEGATIVE mg/dL   Protein, ur 401 (A) NEGATIVE mg/dL   Nitrite POSITIVE (A) NEGATIVE   Leukocytes,Ua TRACE (A) NEGATIVE   RBC / HPF >50 0 - 5 RBC/hpf   WBC, UA >50 0 - 5 WBC/hpf    Comment:        Reflex urine culture not performed if WBC <=10, OR if Squamous epithelial cells >5. If Squamous epithelial cells >5 suggest recollection.    Bacteria, UA MANY (A) NONE SEEN   Squamous Epithelial / HPF  0-5 0 - 5 /HPF   WBC Clumps PRESENT    Mucus PRESENT    Non Squamous Epithelial 6-10 (A) NONE SEEN    Comment: Performed at [facility], [address] [place], [place] [address]  Blood culture (routine x 2)     Status: None (Preliminary result)   Collection Time: 01/06/24  4:16 PM   Specimen: BLOOD LEFT HAND  Result Value Ref Range   Specimen Description      BLOOD LEFT HAND Performed at [facility], [address] [place], [place] [address]    Special Requests      BOTTLES DRAWN AEROBIC AND ANAEROBIC Blood Culture adequate volume Performed at [facility], [address], [place], [place] [address]    Culture      NO GROWTH < 24 HOURS Performed at [facility], [address] [place], [place] [address]    Report Status PENDING   CBG monitoring, ED     Status: Abnormal   Collection Time: 01/06/24  8:10 PM  Result Value Ref Range   Glucose-Capillary 416 (H) 70 - 99 mg/dL    Comment: Glucose reference range applies only to samples taken after fasting for at least 8 hours.  CBG monitoring, ED     Status: Abnormal   Collection Time: 01/06/24  9:03 PM  Result Value Ref Range   Glucose-Capillary 357 (H) 70 - 99 mg/dL    Comment: Glucose reference range applies only to samples taken after fasting for  at least 8 hours.  CK     Status: None   Collection Time: 01/06/24  9:30 PM  Result Value Ref Range   Total CK 362 49 - 397 U/L    Comment: Performed at [facility], [address] [place], [place] [address]  Lipase, blood     Status: None   Collection Time: 01/06/24  9:30 PM  Result Value Ref Range   Lipase 20 11 - 51 U/L    Comment: Performed at [facility], [address], [place], [place] [address]  Lactic acid, plasma     Status: None   Collection Time: 01/06/24  9:30 PM  Result Value Ref Range   Lactic Acid, Venous 1.7 0.5 - 1.9 mmol/L    Comment: Performed at [facility], [address] [place], [place] [address]  CBG monitoring, ED     Status: Abnormal   Collection Time: 01/06/24 10:00 PM  Result Value Ref Range   Glucose-Capillary 345 (H) 70 - 99 mg/dL    Comment: Glucose reference range applies only to samples taken after fasting for at least 8 hours.  Basic metabolic panel with GFR     Status: Abnormal   Collection Time: 01/06/24 10:36 PM  Result Value Ref Range   Sodium 129 (L) 135 - 145 mmol/L   Potassium 3.6 3.5 - 5.1 mmol/L   Chloride 93 (L) 98 - 111 mmol/L   CO2 21 (L) 22 - 32 mmol/L   Glucose, Bld 353 (H) 70 - 99 mg/dL    Comment: Glucose reference range applies only to samples taken after fasting for at least 8 hours.   BUN 26 (H) 6 - 20 mg/dL   Creatinine, Ser 4.25 0.61 - 1.24 mg/dL   Calcium 8.6 (L) 8.9 - 10.3 mg/dL   GFR, Estimated >95 >63 mL/min    Comment: (NOTE) Calculated using the CKD-EPI Creatinine Equation (2021)    Anion gap 15 5 - 15    Comment: Performed at [facility], [address] [place], [place] [address]  Magnesium     Status: None   Collection Time: 01/06/24 10:36 PM  Result Value Ref Range   Magnesium 1.7 1.7 - 2.4 mg/dL    Comment: Performed at [facility], [address], [place], [place] [address]  Phosphorus     Status: None   Collection Time: 01/06/24 10:36 PM  Result Value Ref Range   Phosphorus 3.0 2.5 - 4.6 mg/dL    Comment: Performed at [facility], [address] [place], [place] [address]  Troponin I (High Sensitivity)     Status: Abnormal   Collection Time: 01/06/24 10:36 PM  Result Value Ref Range   Troponin I (High Sensitivity) 105 (HH) <18 ng/L    Comment: CRITICAL RESULT CALLED TO, READ BACK BY AND VERIFIED WITH fox, m. rn at 2322 on 4.15.25. fa (NOTE) Elevated high sensitivity troponin I (hsTnI) values and significant  changes across serial measurements may suggest ACS but many other  chronic and acute  conditions are known to elevate hsTnI results.  Refer to the "Links" section for chest pain algorithms and additional  guidance. Performed at [facility], [address], [place], [place] [address]   CBC with Differential     Status: Abnormal   Collection Time: 01/06/24 10:36 PM  Result Value Ref Range   WBC 26.5 (H) 4.0 - 10.5 K/uL   RBC 3.71 (  L) 4.22 - 5.81 MIL/uL   Hemoglobin 11.2 (L) 13.0 - 17.0 g/dL   HCT 40.9 (L) 81.1 - 91.4 %   MCV 93.8 80.0 - 100.0 fL   MCH 30.2 26.0 - 34.0 pg   MCHC 32.2 30.0 - 36.0 g/dL   RDW 78.2 95.6 - 21.3 %   Platelets 478 (H) 150 - 400 K/uL   nRBC 0.0 0.0 - 0.2 %   Neutrophils Relative % 94 %   Neutro Abs 25.0 (H) 1.7 - 7.7 K/uL   Lymphocytes Relative 3 %   Lymphs Abs 0.8 0.7 - 4.0 K/uL   Monocytes Relative 2 %   Monocytes Absolute 0.5 0.1 - 1.0 K/uL   Eosinophils Relative 0 %   Eosinophils Absolute 0.0 0.0 - 0.5 K/uL   Basophils Relative 0 %   Basophils Absolute 0.0 0.0 - 0.1 K/uL   WBC Morphology MORPHOLOGY UNREMARKABLE    Immature Granulocytes 1 %   Abs Immature Granulocytes 0.20 (H) 0.00 - 0.07 K/uL    Comment: Performed at [facility], [address] [place], [place] [address]  CBG monitoring, ED     Status: Abnormal   Collection Time: 01/07/24 12:30 AM  Result Value Ref Range   Glucose-Capillary 153 (H) 70 - 99 mg/dL    Comment: Glucose reference range applies only to samples taken after fasting for at least 8 hours.  CBG monitoring, ED     Status: Abnormal   Collection Time: 01/07/24  1:37 AM  Result Value Ref Range   Glucose-Capillary 157 (H) 70 - 99 mg/dL    Comment: Glucose reference range applies only to samples taken after fasting for at least 8 hours.  CBG monitoring, ED     Status: Abnormal   Collection Time: 01/07/24  3:42 AM  Result Value Ref Range   Glucose-Capillary 209 (H) 70 - 99 mg/dL    Comment: Glucose reference range applies only to samples taken after fasting for at least 8  hours.  CBG monitoring, ED     Status: Abnormal   Collection Time: 01/07/24  4:51 AM  Result Value Ref Range   Glucose-Capillary 213 (H) 70 - 99 mg/dL    Comment: Glucose reference range applies only to samples taken after fasting for at least 8 hours.  Protime-INR     Status: Abnormal   Collection Time: 01/07/24  4:57 AM  Result Value Ref Range   Prothrombin Time 18.0 (H) 11.4 - 15.2 seconds   INR 1.5 (H) 0.8 - 1.2    Comment: (NOTE) INR goal varies based on device and disease states. Performed at [facility], [address], [place], [place] [address]   Basic metabolic panel with GFR     Status: Abnormal   Collection Time: 01/07/24  4:57 AM  Result Value Ref Range   Sodium 130 (L) 135 - 145 mmol/L   Potassium 3.1 (L) 3.5 - 5.1 mmol/L   Chloride 95 (L) 98 - 111 mmol/L   CO2 21 (L) 22 - 32 mmol/L   Glucose, Bld 241 (H) 70 - 99 mg/dL    Comment: Glucose reference range applies only to samples taken after fasting for at least 8 hours.   BUN 19 6 - 20 mg/dL   Creatinine, Ser 2.95 (L) 0.61 - 1.24 mg/dL   Calcium 8.2 (L) 8.9 - 10.3 mg/dL   GFR, Estimated >28 >41 mL/min    Comment: (NOTE) Calculated using the CKD-EPI Creatinine Equation (2021)  Anion gap 14 5 - 15    Comment: Performed at [facility], [address] [place], [place] [address]  CBC     Status: Abnormal   Collection Time: 01/07/24  4:57 AM  Result Value Ref Range   WBC 26.0 (H) 4.0 - 10.5 K/uL   RBC 3.21 (L) 4.22 - 5.81 MIL/uL   Hemoglobin 9.8 (L) 13.0 - 17.0 g/dL   HCT 62.1 (L) 30.8 - 65.7 %   MCV 91.3 80.0 - 100.0 fL   MCH 30.5 26.0 - 34.0 pg   MCHC 33.4 30.0 - 36.0 g/dL   RDW 84.6 96.2 - 95.2 %   Platelets 436 (H) 150 - 400 K/uL   nRBC 0.0 0.0 - 0.2 %    Comment: Performed at [facility], [address] [place], [place] [address]  Magnesium     Status: None   Collection Time: 01/07/24  4:57 AM  Result Value Ref Range   Magnesium 2.2 1.7 - 2.4 mg/dL    Comment: Performed at [facility], [address], [place], [place] [address]  Phosphorus     Status: Abnormal   Collection Time: 01/07/24  4:57 AM  Result Value Ref Range   Phosphorus 2.4 (L) 2.5 - 4.6 mg/dL    Comment: Performed at [facility], [address], [place], [place] [address]  Hemoglobin A1c     Status: Abnormal   Collection Time: 01/07/24  4:57 AM  Result Value Ref Range   Hgb A1c MFr Bld 9.4 (H) 4.8 - 5.6 %    Comment: (NOTE) Pre diabetes:          5.7%-6.4%  Diabetes:              >6.4%  Glycemic control for   <7.0% adults with diabetes    Mean Plasma Glucose 223.08 mg/dL    Comment: Performed at [facility], [address] [place], [place] [address]  Troponin I (High Sensitivity)     Status: Abnormal   Collection Time: 01/07/24  4:57 AM  Result Value Ref Range   Troponin I (High Sensitivity) 109 (HH) <18 ng/L    Comment: CRITICAL RESULT CALLED TO, READ BACK BY AND VERIFIED WITH S.SIMS RN [phone] 01/07/2024 BY G.GANADEN (NOTE) Elevated high sensitivity troponin I (hsTnI) values and significant  changes across serial measurements may suggest ACS but many other  chronic and acute conditions are known to elevate hsTnI results.  Refer to the "Links" section for chest pain algorithms and additional  guidance. Performed at [facility], [address] [place], [place] [address]   CBG monitoring, ED     Status: Abnormal   Collection Time: 01/07/24  5:52 AM  Result Value Ref Range   Glucose-Capillary 182 (H) 70 - 99 mg/dL    Comment: Glucose reference range applies only to samples taken after fasting for at least 8 hours.  CBG monitoring, ED     Status: Abnormal   Collection Time: 01/07/24  6:57 AM  Result Value Ref Range   Glucose-Capillary 187 (H) 70 - 99 mg/dL    Comment: Glucose reference range applies only to samples taken after fasting for at least 8 hours.  CBG monitoring, ED     Status: Abnormal   Collection Time: 01/07/24  8:02 AM  Result Value Ref Range    Glucose-Capillary 144 (H) 70 - 99 mg/dL    Comment: Glucose reference range applies only  to samples taken after fasting for at least 8 hours.    MR THORACIC SPINE W WO CONTRAST Result Date: 01/07/2024 CLINICAL DATA:  Possible spinal abscess on CT EXAM: MRI CERVICAL, THORACIC AND LUMBAR SPINE WITHOUT AND WITH CONTRAST TECHNIQUE: Multiplanar and multiecho pulse sequences of the cervical spine, to include the craniocervical junction and cervicothoracic junction, and thoracic and lumbar spine, were obtained without and with intravenous contrast. CONTRAST:  7mL GADAVIST GADOBUTROL 1 MMOL/ML IV SOLN COMPARISON:  Abdominal CT from yesterday.  Thoracic MRI 02/06/2020 FINDINGS: MRI CERVICAL SPINE FINDINGS Alignment: No acute malalignment Vertebrae: Endplate edema at Z6-1 and C3-4 is likely degenerative. No evidence of endplate destruction or paravertebral edema. No acute fracture Cord: Not well assessed given the degree of motion artifact, canal exclude cord signal abnormality. Posterior Fossa, vertebral arteries, paraspinal tissues: Absent flow void with collapse in the right ICA. The distal right vertebral flow void is also not seen, both findings known from 2023 brain MRI. Disc levels: C2-3: Facet spurring and disc space narrowing without impingement C3-4: Disc collapse with endplate and uncovertebral ridging. There is a broad central protrusion. Spinal stenosis with cord flattening, midline canal diameter of 5 mm. Bilateral foraminal narrowing is mild by postcontrast axial images. C4-5: Disc collapse and endplate degeneration with circumferential disc bulging and ridging. Spinal stenosis with cord flattening. Advanced right and moderate left foraminal narrowing by postcontrast axial images. C5-6: Disc narrowing and bulging with uncovertebral spurring. Degenerative facet spurring greater on the right. Mild spinal stenosis. Moderate bilateral foraminal narrowing by axial postcontrast images. C6-7: Disc space  narrowing and mild spurring. No evidence of impingement C7-T1:Mild facet spurring and disc bulging. No evidence of impingement MRI THORACIC SPINE FINDINGS Alignment:  No acute malalignment.  Generalized thoracic kyphosis. Vertebrae: T9-10 and T10-11 intervertebral ankylosis, site of prior discitis. Remote compression fracture of T12. No acute fracture or endplate destruction Cord: Normal cord signal. Posterior and right eccentric epidural collection with rim enhancement starting it T10-11 with epidural thickening starting at T7-8. The cord is displaced leftward and ventrally at T10 and below without detected cord edema. Paraspinal and other soft tissues: No paraspinal phlegmon or mass seen. Disc levels: Generalized disc desiccation and narrowing. Mild multilevel facet spurring. Small disc herniations at T4-5 and T8-9, insignificant. MRI LUMBAR SPINE FINDINGS Segmentation:  Standard. Alignment:  Mild L4-5 and L5-S1 retrolisthesis Vertebrae: T2 hyperintensity with gas phenomenon in the L4-5 and L5-S1 disc spaces, favor degenerative in the absence of significant endplate edema or endplate destruction. No acute fracture. Remote L1 compression fracture. Mild degenerative marrow edema at the L4-5 facets. Conus medullaris and cauda equina: Posterior epidural collection beginning of thoracic spine becomes more ventral predominant at L3 and below, ventral collection measuring up to 7 mm in thickness on sagittal images with bubbles of gas. Although there is still continuing epidural thickening and hyperenhancement, no collection seen below L5-S1 Paraspinal and other soft tissues: No perispinal abscess detected. See prior abdominal CT Disc levels: Generalized degenerative disc narrowing and bulging with degenerative facet spurring at L2-3 and below. Degeneration contributes to bilateral foraminal stenosis at L3-4 and below spinal stenosis especially at L4-5. IMPRESSION: Cervical spine: No acute finding.  No evidence of  discitis or abscess. Very motion degraded MRI still positive for moderate degenerative spinal stenosis at C3-4 and C4-5. Foraminal stenoses at C3-4 to C5-6. Absent right carotid and vertebral flow voids, chronic when compared to 2023. Thoracic and lumbar spine: Epidural abscess spanning T10-11 to L5-S1, primarily posterior canal in the upper  extent and in the anterior canal at the lower extent. At the level of L3, ventral abscess measures up to 7 mm in thickness and contains gas. There is some disc edematous signal at L4-5 and L5-S1, but no clear discitis, suspect direct paravertebral seeding from UTI based on prior abdominal CT. The lower cord and cauda equina is diffusely compressed by the collection. Electronically Signed   By: Ronnette Coke M.D.   On: 01/07/2024 05:08   MR Lumbar Spine W Wo Contrast Result Date: 01/07/2024 CLINICAL DATA:  Possible spinal abscess on CT EXAM: MRI CERVICAL, THORACIC AND LUMBAR SPINE WITHOUT AND WITH CONTRAST TECHNIQUE: Multiplanar and multiecho pulse sequences of the cervical spine, to include the craniocervical junction and cervicothoracic junction, and thoracic and lumbar spine, were obtained without and with intravenous contrast. CONTRAST:  7mL GADAVIST GADOBUTROL 1 MMOL/ML IV SOLN COMPARISON:  Abdominal CT from yesterday.  Thoracic MRI 02/06/2020 FINDINGS: MRI CERVICAL SPINE FINDINGS Alignment: No acute malalignment Vertebrae: Endplate edema at Z6-1 and C3-4 is likely degenerative. No evidence of endplate destruction or paravertebral edema. No acute fracture Cord: Not well assessed given the degree of motion artifact, canal exclude cord signal abnormality. Posterior Fossa, vertebral arteries, paraspinal tissues: Absent flow void with collapse in the right ICA. The distal right vertebral flow void is also not seen, both findings known from 2023 brain MRI. Disc levels: C2-3: Facet spurring and disc space narrowing without impingement C3-4: Disc collapse with endplate and  uncovertebral ridging. There is a broad central protrusion. Spinal stenosis with cord flattening, midline canal diameter of 5 mm. Bilateral foraminal narrowing is mild by postcontrast axial images. C4-5: Disc collapse and endplate degeneration with circumferential disc bulging and ridging. Spinal stenosis with cord flattening. Advanced right and moderate left foraminal narrowing by postcontrast axial images. C5-6: Disc narrowing and bulging with uncovertebral spurring. Degenerative facet spurring greater on the right. Mild spinal stenosis. Moderate bilateral foraminal narrowing by axial postcontrast images. C6-7: Disc space narrowing and mild spurring. No evidence of impingement C7-T1:Mild facet spurring and disc bulging. No evidence of impingement MRI THORACIC SPINE FINDINGS Alignment:  No acute malalignment.  Generalized thoracic kyphosis. Vertebrae: T9-10 and T10-11 intervertebral ankylosis, site of prior discitis. Remote compression fracture of T12. No acute fracture or endplate destruction Cord: Normal cord signal. Posterior and right eccentric epidural collection with rim enhancement starting it T10-11 with epidural thickening starting at T7-8. The cord is displaced leftward and ventrally at T10 and below without detected cord edema. Paraspinal and other soft tissues: No paraspinal phlegmon or mass seen. Disc levels: Generalized disc desiccation and narrowing. Mild multilevel facet spurring. Small disc herniations at T4-5 and T8-9, insignificant. MRI LUMBAR SPINE FINDINGS Segmentation:  Standard. Alignment:  Mild L4-5 and L5-S1 retrolisthesis Vertebrae: T2 hyperintensity with gas phenomenon in the L4-5 and L5-S1 disc spaces, favor degenerative in the absence of significant endplate edema or endplate destruction. No acute fracture. Remote L1 compression fracture. Mild degenerative marrow edema at the L4-5 facets. Conus medullaris and cauda equina: Posterior epidural collection beginning of thoracic spine  becomes more ventral predominant at L3 and below, ventral collection measuring up to 7 mm in thickness on sagittal images with bubbles of gas. Although there is still continuing epidural thickening and hyperenhancement, no collection seen below L5-S1 Paraspinal and other soft tissues: No perispinal abscess detected. See prior abdominal CT Disc levels: Generalized degenerative disc narrowing and bulging with degenerative facet spurring at L2-3 and below. Degeneration contributes to bilateral foraminal stenosis at L3-4 and below  spinal stenosis especially at L4-5. IMPRESSION: Cervical spine: No acute finding.  No evidence of discitis or abscess. Very motion degraded MRI still positive for moderate degenerative spinal stenosis at C3-4 and C4-5. Foraminal stenoses at C3-4 to C5-6. Absent right carotid and vertebral flow voids, chronic when compared to 2023. Thoracic and lumbar spine: Epidural abscess spanning T10-11 to L5-S1, primarily posterior canal in the upper extent and in the anterior canal at the lower extent. At the level of L3, ventral abscess measures up to 7 mm in thickness and contains gas. There is some disc edematous signal at L4-5 and L5-S1, but no clear discitis, suspect direct paravertebral seeding from UTI based on prior abdominal CT. The lower cord and cauda equina is diffusely compressed by the collection. Electronically Signed   By: Ronnette Coke M.D.   On: 01/07/2024 05:08   MR CERVICAL SPINE W WO CONTRAST Result Date: 01/07/2024 CLINICAL DATA:  Possible spinal abscess on CT EXAM: MRI CERVICAL, THORACIC AND LUMBAR SPINE WITHOUT AND WITH CONTRAST TECHNIQUE: Multiplanar and multiecho pulse sequences of the cervical spine, to include the craniocervical junction and cervicothoracic junction, and thoracic and lumbar spine, were obtained without and with intravenous contrast. CONTRAST:  7mL GADAVIST GADOBUTROL 1 MMOL/ML IV SOLN COMPARISON:  Abdominal CT from yesterday.  Thoracic MRI 02/06/2020  FINDINGS: MRI CERVICAL SPINE FINDINGS Alignment: No acute malalignment Vertebrae: Endplate edema at V7-8 and C3-4 is likely degenerative. No evidence of endplate destruction or paravertebral edema. No acute fracture Cord: Not well assessed given the degree of motion artifact, canal exclude cord signal abnormality. Posterior Fossa, vertebral arteries, paraspinal tissues: Absent flow void with collapse in the right ICA. The distal right vertebral flow void is also not seen, both findings known from 2023 brain MRI. Disc levels: C2-3: Facet spurring and disc space narrowing without impingement C3-4: Disc collapse with endplate and uncovertebral ridging. There is a broad central protrusion. Spinal stenosis with cord flattening, midline canal diameter of 5 mm. Bilateral foraminal narrowing is mild by postcontrast axial images. C4-5: Disc collapse and endplate degeneration with circumferential disc bulging and ridging. Spinal stenosis with cord flattening. Advanced right and moderate left foraminal narrowing by postcontrast axial images. C5-6: Disc narrowing and bulging with uncovertebral spurring. Degenerative facet spurring greater on the right. Mild spinal stenosis. Moderate bilateral foraminal narrowing by axial postcontrast images. C6-7: Disc space narrowing and mild spurring. No evidence of impingement C7-T1:Mild facet spurring and disc bulging. No evidence of impingement MRI THORACIC SPINE FINDINGS Alignment:  No acute malalignment.  Generalized thoracic kyphosis. Vertebrae: T9-10 and T10-11 intervertebral ankylosis, site of prior discitis. Remote compression fracture of T12. No acute fracture or endplate destruction Cord: Normal cord signal. Posterior and right eccentric epidural collection with rim enhancement starting it T10-11 with epidural thickening starting at T7-8. The cord is displaced leftward and ventrally at T10 and below without detected cord edema. Paraspinal and other soft tissues: No paraspinal  phlegmon or mass seen. Disc levels: Generalized disc desiccation and narrowing. Mild multilevel facet spurring. Small disc herniations at T4-5 and T8-9, insignificant. MRI LUMBAR SPINE FINDINGS Segmentation:  Standard. Alignment:  Mild L4-5 and L5-S1 retrolisthesis Vertebrae: T2 hyperintensity with gas phenomenon in the L4-5 and L5-S1 disc spaces, favor degenerative in the absence of significant endplate edema or endplate destruction. No acute fracture. Remote L1 compression fracture. Mild degenerative marrow edema at the L4-5 facets. Conus medullaris and cauda equina: Posterior epidural collection beginning of thoracic spine becomes more ventral predominant at L3 and below, ventral collection  measuring up to 7 mm in thickness on sagittal images with bubbles of gas. Although there is still continuing epidural thickening and hyperenhancement, no collection seen below L5-S1 Paraspinal and other soft tissues: No perispinal abscess detected. See prior abdominal CT Disc levels: Generalized degenerative disc narrowing and bulging with degenerative facet spurring at L2-3 and below. Degeneration contributes to bilateral foraminal stenosis at L3-4 and below spinal stenosis especially at L4-5. IMPRESSION: Cervical spine: No acute finding.  No evidence of discitis or abscess. Very motion degraded MRI still positive for moderate degenerative spinal stenosis at C3-4 and C4-5. Foraminal stenoses at C3-4 to C5-6. Absent right carotid and vertebral flow voids, chronic when compared to 2023. Thoracic and lumbar spine: Epidural abscess spanning T10-11 to L5-S1, primarily posterior canal in the upper extent and in the anterior canal at the lower extent. At the level of L3, ventral abscess measures up to 7 mm in thickness and contains gas. There is some disc edematous signal at L4-5 and L5-S1, but no clear discitis, suspect direct paravertebral seeding from UTI based on prior abdominal CT. The lower cord and cauda equina is diffusely  compressed by the collection. Electronically Signed   By: Tiburcio Pea M.D.   On: 01/07/2024 05:08   CT ABDOMEN PELVIS W CONTRAST Result Date: 01/06/2024 CLINICAL DATA:  Urinary tract infection, recent change in antibiotics. Back pain/flank pain. EXAM: CT ABDOMEN AND PELVIS WITH CONTRAST TECHNIQUE: Multidetector CT imaging of the abdomen and pelvis was performed using the standard protocol following bolus administration of intravenous contrast. RADIATION DOSE REDUCTION: This exam was performed according to the departmental dose-optimization program which includes automated exposure control, adjustment of the mA and/or kV according to patient size and/or use of iterative reconstruction technique. CONTRAST:  OMNIPAQUE IOHEXOL 300 MG/ML  SOLN COMPARISON:  Lumbar radiographs 12/31/2023 and CT abdomen 02/06/2020 FINDINGS: The patient is contracted and rotated on today's exam with some resulting reduced diagnostic accuracy. Lower chest: Coronary and descending thoracic aortic atherosclerosis. Distal esophageal wall thickening is nonspecific although esophagitis would be a common cause. Airway plugging in the right lower lobe with bandlike irregular right lower lobe opacity likely reflecting mild localized pneumonia. Lesser degree of airway plugging in the left lower lobe. Trace left pleural thickening posteriorly on image 16 series 2, nonspecific. Hepatobiliary: Intrahepatic biliary dilatation similar to previous without definite substantial extrahepatic biliary dilatation. Cavernous transformation of the portal vein. No definite focal liver mass identified. Pancreas: Heterogeneous density in the pancreas diffusely, pancreatitis is not excluded. Scattered calcifications likely reflect a chronic component of pancreatitis. Correlate with lipase levels. No obvious abscess or pseudocyst. Spleen: Unremarkable Adrenals/Urinary Tract: Fullness of the adrenal glands without discrete mass. Relative left renal atrophy.  Indistinctly marginated parenchymal hypodensity in the right mid kidney on image 101 series 8 and also shown on image 32 series 2, suspicious for pyelonephritis. No hydronephrosis or hydroureter. 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 No well-defined drainable abscess. No free air in the peritoneal cavity identified. Stomach/Bowel: Prominent stool throughout the colon favors constipation. Normal appendix. No imaging findings of pseudomembranous colitis. 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 No out right complete occlusion. Celiac trunk appears patent. Aortic and  proximal renal artery atheromatous vascular calcifications. Reproductive: Unremarkable Other: No supplemental non-categorized findings. Musculoskeletal: Prior interbody fusion at T9-T10-T11. Scattered abnormal gas in the spinal canal extending from the T12 level through the L5 level, some peripheral and some central in distribution in the canal. Cannot exclude intradural gas or regional spinal canal abscess, although I do not see new definite bony destructive findings along the endplates to further indicate recurrent discitis. Lumbar MRI with and without contrast is recommended for further characterization. IMPRESSION: 1. 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 No well-defined drainable pelvic abscess. 2. Indistinctly marginated parenchymal hypodensity in the right mid kidney, suspicious for  pyelonephritis. 3. Scattered abnormal gas in the spinal canal extending from the T12 level through the L5 level, some peripheral and some central in distribution in the canal. Cannot exclude intradural gas or regional spinal canal abscess, although I do not see new definite bony destructive findings along the endplates to further indicate recurrent discitis. Lumbar MRI with and without contrast is recommended for further characterization, specifically to assess for spinal infection. 4. Heterogeneous density in the pancreas diffusely, pancreatitis is not excluded. Scattered calcifications likely reflect a chronic component of pancreatitis. Correlate with lipase levels. 5. Airway plugging in the right lower lobe with bandlike irregular right lower lobe opacity likely reflecting mild localized pneumonia. Lesser degree of airway plugging in the left lower lobe. 6. 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 7. Prominent stool throughout the colon favors constipation. 8. Coronary and descending thoracic aortic atherosclerosis. Aortic Atherosclerosis (ICD10-I70.0). 9. Distal esophageal wall thickening is nonspecific although esophagitis would be a common cause. Electronically Signed   By: Gaylyn Rong M.D.   On: 01/06/2024 17:04    Review of Systems  Musculoskeletal:  Positive for back pain.       Patient has been unable to ambulate for several months time.  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  Neurological:  Positive for weakness.   Blood pressure 129/85, pulse 78, temperature 97.6 F (36.4 C), temperature source Oral, resp. rate 17, SpO2 97%. Physical Exam Constitutional:      Comments: Patient is arousable to voice and will answer some simple questions however he is not very conversant or knowledgeable about his current condition other than he is having back pain and has hypersensitivity to touch on the left side.   HENT:     Head: Normocephalic and atraumatic.     Right Ear: Tympanic membrane, ear canal and external ear normal.     Left Ear: Tympanic membrane, ear canal and external ear normal.     Nose: Nose normal.     Mouth/Throat:     Mouth: Mucous membranes are dry.  Eyes:     Extraocular Movements: Extraocular movements intact.     Conjunctiva/sclera: Conjunctivae normal.     Pupils: Pupils are equal, round, and reactive to light.  Neurological:     Comments: Patient is arousable and minimally conversant.  Complains of severe pain to any touch on his left side from about the level of the mid abdomen down the entirety of the left leg.  He is not move his left upper extremity volitionally.  Does not move his left lower extremity volitionally.  He moves his right toes spontaneously he has 2 out  of 5 strength in the iliopsoas and the quadriceps on that right side.  His left grip is weak to 4 out of 5.  Sensation on the left side appears to be intact to light touch though pin and painful sensation seems to be diminished on that right side.  His cranial nerve exam appears to be intact.     Assessment/Plan: Patient has presented with a systemic infection that now involves abscess formation in the thoracic and lumbar spines.  His performance level has been exceedingly poor and he has multiple core morbidities including a history of previous epidural abscess.  At the current time he started on antibiotics cultures have been obtained however the concern is whether surgical intervention should be considered for this abscess in the epidural space.  I do not believe draining the abscess is going to change his clinical outcome and if the response to antibiotics is positive this may be the best treatment for him.  For now we will follow along but I am not planning any surgical intervention at this time.  Brandon [name] 01/07/2024, 9:18 AM

## 2024-01-07 NOTE — Consult Note (Signed)
 Urology Consult Note   Requesting Attending Physician:  Jodeane Mulligan, DO Service Providing Consult: Urology  Consulting Attending: Dr. Parke Boll   Reason for Consult: Emphysematous cystitis  HPI: Brandon [name] is seen in consultation for reasons noted above at the request of Jodeane Mulligan, DO.  Patient is a 58 year old male transferred in from SNF D/T worsening back pain, lower extremity weakness, numbness, pain, fever, and hematuria.  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  Patient also displayed abnormal gas throughout the spinal canal and shortly thereafter went into supraventricular tachycardia.  He has been transferred to [facility] for higher level of care.  Pt resting in ED on arrival. Unsure of patients baseline mental status. He again refused his catheter.  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  Patient has declined.  2 weeks culture directed ABX.  Patient may benefit from repeat CT to assess for progress since he has extensive infection and has refused bladder decompression. If he changes his mind, please use coude catheter.   Outpatient cystoscopy  Urology will follow peripherally.  Case and plan discussed with Dr. Parke Boll  Past Medical History: Past Medical History:  Diagnosis Date   Alcohol dependency (HCC)    Hx ETOH withdrawal seizures before 2011   CAD (coronary artery disease) 06/13/2012   Calcification noted on CTA of chest in 2012 Wall motion abnormality on ECHO     Carotid artery disease (HCC) 2016   bilateral.  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    Depression with anxiety    Diabetes mellitus without complication (HCC)    GERD (gastroesophageal  reflux disease)    Headache(784.0)    migraine   Heart disease 02/2015   PCI/DES placed to RCA: on chronic Plavix/ASA   Hyperlipidemia    Hypertension    Pancreatitis 2011....    CT findings in May 2011 with inflammation and pseudocyst.  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/2016   Renal artery stenosis, native, bilateral (HCC) 02/2015    Past Surgical History:  Past Surgical History:  Procedure Laterality Date   CARDIAC CATHETERIZATION N/A 03/15/2015   Procedure: Left Heart Cath;  Surgeon: Cherrie Cornwall, MD;  Location: [facility] INVASIVE CV LAB;  Service: Cardiovascular;  Laterality: N/A;   CARDIAC CATHETERIZATION N/A 03/16/2015   Procedure: Coronary Stent Intervention;  Surgeon: Antonette Batters, MD;  Location: [facility] INVASIVE CV LAB;  Service: Cardiovascular;  Laterality: N/A;   CAROTID ANGIOGRAM N/A 06/15/2012   Procedure: CAROTID ANGIOGRAM;  Surgeon: Dannis Dy, MD;  Location: [facility] CATH LAB;  Service: Cardiovascular;  Laterality: N/A;   ESOPHAGOGASTRODUODENOSCOPY N/A 03/08/2016   Procedure: ESOPHAGOGASTRODUODENOSCOPY (EGD);  Surgeon: Danette Duos, MD;  Location: [facility] ENDOSCOPY;  Service: Gastroenterology;  Laterality: N/A;   none     PERIPHERAL VASCULAR CATHETERIZATION Left 04/06/2015   Procedure: Carotid PTA/Stent Intervention;  Surgeon: Celso College, MD;  Location: [facility] INVASIVE CV LAB;  Service: Cardiovascular;  Laterality: Left;    Medication: Current Facility-Administered Medications  Medication Dose Route Frequency Provider Last Rate Last Admin   acetaminophen (TYLENOL) tablet 1,000 mg  1,000 mg Oral Q6H PRN Segars, Arlyce Lambert, MD       adenosine (ADENOCARD) 6 MG/2ML injection 3 mg  3 mg Intravenous Once Segars, Jonathan, MD  adenosine (ADENOCARD) 6 MG/2ML injection            albuterol (PROVENTIL) (2.5 MG/3ML) 0.083% nebulizer solution 2.5 mg  2.5 mg Nebulization Q4H PRN Segars, Christiane Ha, MD       busPIRone (BUSPAR) tablet 10 mg  10 mg Oral BID Dolly Rias, MD   10  mg at 01/07/24 0133   ceFEPIme (MAXIPIME) 2 g in sodium chloride 0.9 % 100 mL IVPB  2 g Intravenous Bebe Liter, MD   Stopped at 01/07/24 0640   dextrose 5 % in lactated ringers infusion   Intravenous Continuous Dolly Rias, MD 125 mL/hr at 01/07/24 0036 New Bag at 01/07/24 0036   dextrose 50 % solution 0-50 mL  0-50 mL Intravenous PRN Dolly Rias, MD       famotidine (PEPCID) tablet 20 mg  20 mg Oral BID Dolly Rias, MD   20 mg at 01/07/24 0133   HYDROmorphone (DILAUDID) injection 0.5 mg  0.5 mg Intravenous Q4H PRN Dolly Rias, MD   0.5 mg at 01/06/24 2110   insulin regular, human (MYXREDLIN) 100 units/ 100 mL infusion   Intravenous Continuous Dolly Rias, MD 6.5 mL/hr at 01/07/24 0700 6.5 Units/hr at 01/07/24 0700   lactated ringers infusion   Intravenous Continuous Dolly Rias, MD   Stopped at 01/07/24 0039   levETIRAcetam (KEPPRA) tablet 500 mg  500 mg Oral BID Dolly Rias, MD   500 mg at 01/06/24 2339   lipase/protease/amylase (CREON) capsule 36,000 Units  36,000 Units Oral TID WC Dolly Rias, MD       LORazepam (ATIVAN) tablet 1 mg  1 mg Oral Once PRN Dolly Rias, MD       metoprolol tartrate (LOPRESSOR) injection 5 mg  5 mg Intravenous Q8H PRN Edwin Dada P, DO   5 mg at 01/06/24 2318   metoprolol tartrate (LOPRESSOR) tablet 25 mg  25 mg Oral Q6H Dolly Rias, MD   25 mg at 01/07/24 1610   metroNIDAZOLE (FLAGYL) IVPB 500 mg  500 mg Intravenous Domenica Fail, MD   Stopped at 01/06/24 2210   nicotine (NICODERM CQ - dosed in mg/24 hours) patch 14 mg  14 mg Transdermal Daily Segars, Christiane Ha, MD       oxyCODONE (Oxy IR/ROXICODONE) immediate release tablet 2.5 mg  2.5 mg Oral Q4H PRN Dolly Rias, MD       Or   oxyCODONE (Oxy IR/ROXICODONE) immediate release tablet 5 mg  5 mg Oral Q4H PRN Segars, Christiane Ha, MD       potassium chloride 10 mEq in 100 mL IVPB  10 mEq Intravenous Q1 Hr x 3 Kakrakandy, Arshad N, MD        rosuvastatin (CRESTOR) tablet 5 mg  5 mg Oral QHS Dolly Rias, MD   5 mg at 01/07/24 0134   sodium chloride flush (NS) 0.9 % injection 3 mL  3 mL Intravenous Q12H Dolly Rias, MD   3 mL at 01/06/24 2111   tamsulosin (FLOMAX) capsule 0.4 mg  0.4 mg Oral QPC supper Dolly Rias, MD   0.4 mg at 01/07/24 0134   traZODone (DESYREL) tablet 150 mg  150 mg Oral QHS Dolly Rias, MD   150 mg at 01/07/24 0134   vancomycin (VANCOCIN) IVPB 1000 mg/200 mL premix  1,000 mg Intravenous BID Danford Bad, [facility]   Stopped at 01/07/24 9604   Current Outpatient Medications  Medication Sig Dispense Refill   acetaminophen (TYLENOL) 325 MG tablet Take 650 mg by mouth every 6 (six)  hours as needed for mild pain (pain score 1-3) (cannot exceed a total of 3,000 mg/24 hours).     amoxicillin-clavulanate (AUGMENTIN) 875-125 MG tablet Take 1 tablet by mouth every 12 (twelve) hours.     ascorbic acid (VITAMIN C) 500 MG tablet Take 500 mg by mouth daily.     aspirin EC 81 MG tablet Take 1 tablet (81 mg total) by mouth daily. 30 tablet 0   busPIRone (BUSPAR) 10 MG tablet Take 1 tablet (10 mg total) by mouth 2 (two) times daily. 60 tablet 0   clopidogrel (PLAVIX) 75 MG tablet Take 1 tablet (75 mg total) by mouth daily. 30 tablet 0   famotidine (PEPCID) 20 MG tablet Take 1 tablet (20 mg total) by mouth 2 (two) times daily. 60 tablet 0   feeding supplement (ENSURE ENLIVE / ENSURE PLUS) LIQD Take 237 mLs by mouth 3 (three) times daily with meals.     FEROSUL 325 (65 Fe) MG tablet Take 1 tablet (325 mg total) by mouth daily. 30 tablet 0   folic acid (FOLVITE) 1 MG tablet Take 1 tablet (1 mg total) by mouth daily. 30 tablet 0   HM LIDOCAINE PATCH EX Apply 1 patch topically daily. Apply to left shoulder for pain - 5% patch     insulin glargine (LANTUS) 100 UNIT/ML Solostar Pen Inject 27 Units into the skin at bedtime.     insulin lispro (HUMALOG) 100 UNIT/ML injection Inject 2-10 Units into the skin 3 (three) times  daily before meals. Per sliding scale: 70-130= 0 units, 131-180= 2 units, 181-240= 4 units, 241-300= 6 units, 301-350= 8 units, 351-400= 10 units     levETIRAcetam (KEPPRA) 500 MG tablet Take 1 tablet (500 mg total) by mouth 2 (two) times daily for 2 days.     lipase/protease/amylase (CREON) 36000 UNITS CPEP capsule Take 1 capsule (36,000 Units total) by mouth 3 (three) times daily with meals.     metFORMIN (GLUCOPHAGE) 1000 MG tablet Take 1 tablet (1,000 mg total) by mouth 2 (two) times daily with a meal. 60 tablet 0   metoprolol tartrate (LOPRESSOR) 100 MG tablet Take 100 mg by mouth daily.     Multiple Vitamin (MULTIVITAMIN WITH MINERALS) TABS tablet Take 1 tablet by mouth daily. 30 tablet 0   mupirocin ointment (BACTROBAN) 2 % Apply topically 2 (two) times daily.     nicotine (NICODERM CQ - DOSED IN MG/24 HOURS) 14 mg/24hr patch Place 1 patch (14 mg total) onto the skin daily.     Nystatin (GERHARDT'S BUTT CREAM) CREA Apply 1 Application topically 2 (two) times daily.     rosuvastatin (CRESTOR) 5 MG tablet Take 5 mg by mouth at bedtime.     senna-docusate (SENOKOT-S) 8.6-50 MG tablet Take 1 tablet by mouth 2 (two) times daily.     tamsulosin (FLOMAX) 0.4 MG CAPS capsule Take 1 capsule (0.4 mg total) by mouth daily after supper.     traZODone (DESYREL) 150 MG tablet Take 1 tablet (150 mg total) by mouth at bedtime.      Allergies: No Known Allergies  Social History: Social History   Tobacco Use   Smoking status: Every Day    Current packs/day: 1.00    Average packs/day: 1 pack/day for 30.0 years (30.0 ttl pk-yrs)    Types: Cigarettes   Smokeless tobacco: Never   Tobacco comments:    smoking less  Substance Use Topics   Alcohol use: No    Alcohol/week: 6.0 standard drinks of alcohol  Types: 6 Cans of beer per week    Comment: drinks 6-12 beer daily   Drug use: No    Family History Family History  Problem Relation Age of Onset   Stroke Mother        deceased   Coronary  artery disease Mother    Alcohol abuse Mother    Cancer Mother    Hypertension Father        alive   Alcohol abuse Father    Diabetes Father    Kidney disease Father    Stroke Maternal Grandmother     ROS   Objective   Vital signs in last 24 hours: BP 121/69   Pulse 86   Temp 98.2 F (36.8 C) (Axillary)   Resp 20   SpO2 95%   Physical Exam General: Resting, appears a older than stated age.  HEENT: [place]/AT Pulmonary: Normal work of breathing Cardiovascular: no cyanosis Abdomen: Soft, NTTP, nondistended GU: condom cath in place draining cloudy brown urine  Most Recent Labs: Lab Results  Component Value Date   WBC 26.0 (H) 01/07/2024   HGB 9.8 (L) 01/07/2024   HCT 29.3 (L) 01/07/2024   PLT 436 (H) 01/07/2024    Lab Results  Component Value Date   NA 130 (L) 01/07/2024   K 3.1 (L) 01/07/2024   CL 95 (L) 01/07/2024   CO2 21 (L) 01/07/2024   BUN 19 01/07/2024   CREATININE 0.55 (L) 01/07/2024   CALCIUM 8.2 (L) 01/07/2024   MG 2.2 01/07/2024   PHOS 2.4 (L) 01/07/2024    Lab Results  Component Value Date   INR 1.5 (H) 01/07/2024   APTT 32 06/11/2012     Urine Culture: @[id number] (laburin,org,[id number])@   IMAGING: MR THORACIC SPINE W WO CONTRAST Result Date: 01/07/2024 CLINICAL DATA:  Possible spinal abscess on CT EXAM: MRI CERVICAL, THORACIC AND LUMBAR SPINE WITHOUT AND WITH CONTRAST TECHNIQUE: Multiplanar and multiecho pulse sequences of the cervical spine, to include the craniocervical junction and cervicothoracic junction, and thoracic and lumbar spine, were obtained without and with intravenous contrast. CONTRAST:  7mL GADAVIST GADOBUTROL 1 MMOL/ML IV SOLN COMPARISON:  Abdominal CT from yesterday.  Thoracic MRI 02/06/2020 FINDINGS: MRI CERVICAL SPINE FINDINGS Alignment: No acute malalignment Vertebrae: Endplate edema at G9-5 and C3-4 is likely degenerative. No evidence of endplate destruction or paravertebral edema. No acute fracture Cord: Not well  assessed given the degree of motion artifact, canal exclude cord signal abnormality. Posterior Fossa, vertebral arteries, paraspinal tissues: Absent flow void with collapse in the right ICA. The distal right vertebral flow void is also not seen, both findings known from 2023 brain MRI. Disc levels: C2-3: Facet spurring and disc space narrowing without impingement C3-4: Disc collapse with endplate and uncovertebral ridging. There is a broad central protrusion. Spinal stenosis with cord flattening, midline canal diameter of 5 mm. Bilateral foraminal narrowing is mild by postcontrast axial images. C4-5: Disc collapse and endplate degeneration with circumferential disc bulging and ridging. Spinal stenosis with cord flattening. Advanced right and moderate left foraminal narrowing by postcontrast axial images. C5-6: Disc narrowing and bulging with uncovertebral spurring. Degenerative facet spurring greater on the right. Mild spinal stenosis. Moderate bilateral foraminal narrowing by axial postcontrast images. C6-7: Disc space narrowing and mild spurring. No evidence of impingement C7-T1:Mild facet spurring and disc bulging. No evidence of impingement MRI THORACIC SPINE FINDINGS Alignment:  No acute malalignment.  Generalized thoracic kyphosis. Vertebrae: T9-10 and T10-11 intervertebral ankylosis, site of prior discitis. Remote compression fracture of T12.  No acute fracture or endplate destruction Cord: Normal cord signal. Posterior and right eccentric epidural collection with rim enhancement starting it T10-11 with epidural thickening starting at T7-8. The cord is displaced leftward and ventrally at T10 and below without detected cord edema. Paraspinal and other soft tissues: No paraspinal phlegmon or mass seen. Disc levels: Generalized disc desiccation and narrowing. Mild multilevel facet spurring. Small disc herniations at T4-5 and T8-9, insignificant. MRI LUMBAR SPINE FINDINGS Segmentation:  Standard. Alignment:  Mild  L4-5 and L5-S1 retrolisthesis Vertebrae: T2 hyperintensity with gas phenomenon in the L4-5 and L5-S1 disc spaces, favor degenerative in the absence of significant endplate edema or endplate destruction. No acute fracture. Remote L1 compression fracture. Mild degenerative marrow edema at the L4-5 facets. Conus medullaris and cauda equina: Posterior epidural collection beginning of thoracic spine becomes more ventral predominant at L3 and below, ventral collection measuring up to 7 mm in thickness on sagittal images with bubbles of gas. Although there is still continuing epidural thickening and hyperenhancement, no collection seen below L5-S1 Paraspinal and other soft tissues: No perispinal abscess detected. See prior abdominal CT Disc levels: Generalized degenerative disc narrowing and bulging with degenerative facet spurring at L2-3 and below. Degeneration contributes to bilateral foraminal stenosis at L3-4 and below spinal stenosis especially at L4-5. IMPRESSION: Cervical spine: No acute finding.  No evidence of discitis or abscess. Very motion degraded MRI still positive for moderate degenerative spinal stenosis at C3-4 and C4-5. Foraminal stenoses at C3-4 to C5-6. Absent right carotid and vertebral flow voids, chronic when compared to 2023. Thoracic and lumbar spine: Epidural abscess spanning T10-11 to L5-S1, primarily posterior canal in the upper extent and in the anterior canal at the lower extent. At the level of L3, ventral abscess measures up to 7 mm in thickness and contains gas. There is some disc edematous signal at L4-5 and L5-S1, but no clear discitis, suspect direct paravertebral seeding from UTI based on prior abdominal CT. The lower cord and cauda equina is diffusely compressed by the collection. Electronically Signed   By: Ronnette Coke M.D.   On: 01/07/2024 05:08   MR Lumbar Spine W Wo Contrast Result Date: 01/07/2024 CLINICAL DATA:  Possible spinal abscess on CT EXAM: MRI CERVICAL, THORACIC  AND LUMBAR SPINE WITHOUT AND WITH CONTRAST TECHNIQUE: Multiplanar and multiecho pulse sequences of the cervical spine, to include the craniocervical junction and cervicothoracic junction, and thoracic and lumbar spine, were obtained without and with intravenous contrast. CONTRAST:  7mL GADAVIST GADOBUTROL 1 MMOL/ML IV SOLN COMPARISON:  Abdominal CT from yesterday.  Thoracic MRI 02/06/2020 FINDINGS: MRI CERVICAL SPINE FINDINGS Alignment: No acute malalignment Vertebrae: Endplate edema at Z6-1 and C3-4 is likely degenerative. No evidence of endplate destruction or paravertebral edema. No acute fracture Cord: Not well assessed given the degree of motion artifact, canal exclude cord signal abnormality. Posterior Fossa, vertebral arteries, paraspinal tissues: Absent flow void with collapse in the right ICA. The distal right vertebral flow void is also not seen, both findings known from 2023 brain MRI. Disc levels: C2-3: Facet spurring and disc space narrowing without impingement C3-4: Disc collapse with endplate and uncovertebral ridging. There is a broad central protrusion. Spinal stenosis with cord flattening, midline canal diameter of 5 mm. Bilateral foraminal narrowing is mild by postcontrast axial images. C4-5: Disc collapse and endplate degeneration with circumferential disc bulging and ridging. Spinal stenosis with cord flattening. Advanced right and moderate left foraminal narrowing by postcontrast axial images. C5-6: Disc narrowing and bulging with uncovertebral spurring.  Degenerative facet spurring greater on the right. Mild spinal stenosis. Moderate bilateral foraminal narrowing by axial postcontrast images. C6-7: Disc space narrowing and mild spurring. No evidence of impingement C7-T1:Mild facet spurring and disc bulging. No evidence of impingement MRI THORACIC SPINE FINDINGS Alignment:  No acute malalignment.  Generalized thoracic kyphosis. Vertebrae: T9-10 and T10-11 intervertebral ankylosis, site of prior  discitis. Remote compression fracture of T12. No acute fracture or endplate destruction Cord: Normal cord signal. Posterior and right eccentric epidural collection with rim enhancement starting it T10-11 with epidural thickening starting at T7-8. The cord is displaced leftward and ventrally at T10 and below without detected cord edema. Paraspinal and other soft tissues: No paraspinal phlegmon or mass seen. Disc levels: Generalized disc desiccation and narrowing. Mild multilevel facet spurring. Small disc herniations at T4-5 and T8-9, insignificant. MRI LUMBAR SPINE FINDINGS Segmentation:  Standard. Alignment:  Mild L4-5 and L5-S1 retrolisthesis Vertebrae: T2 hyperintensity with gas phenomenon in the L4-5 and L5-S1 disc spaces, favor degenerative in the absence of significant endplate edema or endplate destruction. No acute fracture. Remote L1 compression fracture. Mild degenerative marrow edema at the L4-5 facets. Conus medullaris and cauda equina: Posterior epidural collection beginning of thoracic spine becomes more ventral predominant at L3 and below, ventral collection measuring up to 7 mm in thickness on sagittal images with bubbles of gas. Although there is still continuing epidural thickening and hyperenhancement, no collection seen below L5-S1 Paraspinal and other soft tissues: No perispinal abscess detected. See prior abdominal CT Disc levels: Generalized degenerative disc narrowing and bulging with degenerative facet spurring at L2-3 and below. Degeneration contributes to bilateral foraminal stenosis at L3-4 and below spinal stenosis especially at L4-5. IMPRESSION: Cervical spine: No acute finding.  No evidence of discitis or abscess. Very motion degraded MRI still positive for moderate degenerative spinal stenosis at C3-4 and C4-5. Foraminal stenoses at C3-4 to C5-6. Absent right carotid and vertebral flow voids, chronic when compared to 2023. Thoracic and lumbar spine: Epidural abscess spanning T10-11 to  L5-S1, primarily posterior canal in the upper extent and in the anterior canal at the lower extent. At the level of L3, ventral abscess measures up to 7 mm in thickness and contains gas. There is some disc edematous signal at L4-5 and L5-S1, but no clear discitis, suspect direct paravertebral seeding from UTI based on prior abdominal CT. The lower cord and cauda equina is diffusely compressed by the collection. Electronically Signed   By: Tiburcio Pea M.D.   On: 01/07/2024 05:08   MR CERVICAL SPINE W WO CONTRAST Result Date: 01/07/2024 CLINICAL DATA:  Possible spinal abscess on CT EXAM: MRI CERVICAL, THORACIC AND LUMBAR SPINE WITHOUT AND WITH CONTRAST TECHNIQUE: Multiplanar and multiecho pulse sequences of the cervical spine, to include the craniocervical junction and cervicothoracic junction, and thoracic and lumbar spine, were obtained without and with intravenous contrast. CONTRAST:  7mL GADAVIST GADOBUTROL 1 MMOL/ML IV SOLN COMPARISON:  Abdominal CT from yesterday.  Thoracic MRI 02/06/2020 FINDINGS: MRI CERVICAL SPINE FINDINGS Alignment: No acute malalignment Vertebrae: Endplate edema at O9-6 and C3-4 is likely degenerative. No evidence of endplate destruction or paravertebral edema. No acute fracture Cord: Not well assessed given the degree of motion artifact, canal exclude cord signal abnormality. Posterior Fossa, vertebral arteries, paraspinal tissues: Absent flow void with collapse in the right ICA. The distal right vertebral flow void is also not seen, both findings known from 2023 brain MRI. Disc levels: C2-3: Facet spurring and disc space narrowing without impingement C3-4: Disc collapse  with endplate and uncovertebral ridging. There is a broad central protrusion. Spinal stenosis with cord flattening, midline canal diameter of 5 mm. Bilateral foraminal narrowing is mild by postcontrast axial images. C4-5: Disc collapse and endplate degeneration with circumferential disc bulging and ridging. Spinal  stenosis with cord flattening. Advanced right and moderate left foraminal narrowing by postcontrast axial images. C5-6: Disc narrowing and bulging with uncovertebral spurring. Degenerative facet spurring greater on the right. Mild spinal stenosis. Moderate bilateral foraminal narrowing by axial postcontrast images. C6-7: Disc space narrowing and mild spurring. No evidence of impingement C7-T1:Mild facet spurring and disc bulging. No evidence of impingement MRI THORACIC SPINE FINDINGS Alignment:  No acute malalignment.  Generalized thoracic kyphosis. Vertebrae: T9-10 and T10-11 intervertebral ankylosis, site of prior discitis. Remote compression fracture of T12. No acute fracture or endplate destruction Cord: Normal cord signal. Posterior and right eccentric epidural collection with rim enhancement starting it T10-11 with epidural thickening starting at T7-8. The cord is displaced leftward and ventrally at T10 and below without detected cord edema. Paraspinal and other soft tissues: No paraspinal phlegmon or mass seen. Disc levels: Generalized disc desiccation and narrowing. Mild multilevel facet spurring. Small disc herniations at T4-5 and T8-9, insignificant. MRI LUMBAR SPINE FINDINGS Segmentation:  Standard. Alignment:  Mild L4-5 and L5-S1 retrolisthesis Vertebrae: T2 hyperintensity with gas phenomenon in the L4-5 and L5-S1 disc spaces, favor degenerative in the absence of significant endplate edema or endplate destruction. No acute fracture. Remote L1 compression fracture. Mild degenerative marrow edema at the L4-5 facets. Conus medullaris and cauda equina: Posterior epidural collection beginning of thoracic spine becomes more ventral predominant at L3 and below, ventral collection measuring up to 7 mm in thickness on sagittal images with bubbles of gas. Although there is still continuing epidural thickening and hyperenhancement, no collection seen below L5-S1 Paraspinal and other soft tissues: No perispinal  abscess detected. See prior abdominal CT Disc levels: Generalized degenerative disc narrowing and bulging with degenerative facet spurring at L2-3 and below. Degeneration contributes to bilateral foraminal stenosis at L3-4 and below spinal stenosis especially at L4-5. IMPRESSION: Cervical spine: No acute finding.  No evidence of discitis or abscess. Very motion degraded MRI still positive for moderate degenerative spinal stenosis at C3-4 and C4-5. Foraminal stenoses at C3-4 to C5-6. Absent right carotid and vertebral flow voids, chronic when compared to 2023. Thoracic and lumbar spine: Epidural abscess spanning T10-11 to L5-S1, primarily posterior canal in the upper extent and in the anterior canal at the lower extent. At the level of L3, ventral abscess measures up to 7 mm in thickness and contains gas. There is some disc edematous signal at L4-5 and L5-S1, but no clear discitis, suspect direct paravertebral seeding from UTI based on prior abdominal CT. The lower cord and cauda equina is diffusely compressed by the collection. Electronically Signed   By: Ronnette Coke M.D.   On: 01/07/2024 05:08   CT ABDOMEN PELVIS W CONTRAST Result Date: 01/06/2024 CLINICAL DATA:  Urinary tract infection, recent change in antibiotics. Back pain/flank pain. EXAM: CT ABDOMEN AND PELVIS WITH CONTRAST TECHNIQUE: Multidetector CT imaging of the abdomen and pelvis was performed using the standard protocol following bolus administration of intravenous contrast. RADIATION DOSE REDUCTION: This exam was performed according to the departmental dose-optimization program which includes automated exposure control, adjustment of the mA and/or kV according to patient size and/or use of iterative reconstruction technique. CONTRAST:  OMNIPAQUE IOHEXOL 300 MG/ML  SOLN COMPARISON:  Lumbar radiographs 12/31/2023 and CT abdomen 02/06/2020  FINDINGS: The patient is contracted and rotated on today's exam with some resulting reduced diagnostic  accuracy. Lower chest: Coronary and descending thoracic aortic atherosclerosis. Distal esophageal wall thickening is nonspecific although esophagitis would be a common cause. Airway plugging in the right lower lobe with bandlike irregular right lower lobe opacity likely reflecting mild localized pneumonia. Lesser degree of airway plugging in the left lower lobe. Trace left pleural thickening posteriorly on image 16 series 2, nonspecific. Hepatobiliary: Intrahepatic biliary dilatation similar to previous without definite substantial extrahepatic biliary dilatation. Cavernous transformation of the portal vein. No definite focal liver mass identified. Pancreas: Heterogeneous density in the pancreas diffusely, pancreatitis is not excluded. Scattered calcifications likely reflect a chronic component of pancreatitis. Correlate with lipase levels. No obvious abscess or pseudocyst. Spleen: Unremarkable Adrenals/Urinary Tract: Fullness of the adrenal glands without discrete mass. Relative left renal atrophy. Indistinctly marginated parenchymal hypodensity in the right mid kidney on image 101 series 8 and also shown on image 32 series 2, suspicious for pyelonephritis. No hydronephrosis or hydroureter. 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 No well-defined drainable abscess. No free air in the peritoneal cavity identified. Stomach/Bowel: Prominent stool throughout the colon favors constipation. Normal appendix. No imaging findings of pseudomembranous colitis. 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 No out right complete occlusion. Celiac trunk  appears patent. Aortic and proximal renal artery atheromatous vascular calcifications. Reproductive: Unremarkable Other: No supplemental non-categorized findings. Musculoskeletal: Prior interbody fusion at T9-T10-T11. Scattered abnormal gas in the spinal canal extending from the T12 level through the L5 level, some peripheral and some central in distribution in the canal. Cannot exclude intradural gas or regional spinal canal abscess, although I do not see new definite bony destructive findings along the endplates to further indicate recurrent discitis. Lumbar MRI with and without contrast is recommended for further characterization. IMPRESSION: 1. 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 No well-defined drainable pelvic abscess. 2. Indistinctly marginated parenchymal hypodensity in the right mid kidney, suspicious for pyelonephritis. 3. Scattered abnormal gas in the spinal canal extending from the T12 level through the L5 level, some peripheral and some central in distribution in the canal. Cannot exclude intradural gas or regional spinal canal abscess, although I do not see new definite bony destructive findings along the endplates to further indicate recurrent discitis. Lumbar MRI with and without contrast is recommended for further characterization, specifically to assess for spinal infection. 4. Heterogeneous density in the pancreas diffusely, pancreatitis is not excluded. Scattered calcifications likely reflect a chronic component of pancreatitis. Correlate with lipase levels. 5. Airway plugging in the right lower lobe with bandlike irregular right lower lobe opacity likely reflecting mild localized pneumonia. Lesser degree of airway plugging in the left lower lobe. 6. 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 7. Prominent stool throughout the colon favors constipation. 8. Coronary and descending thoracic aortic atherosclerosis. Aortic Atherosclerosis (ICD10-I70.0). 9. Distal esophageal wall thickening is nonspecific although esophagitis would be a common cause. Electronically Signed   By: Freida Jes  M.D.   On: 01/06/2024 17:04    ------  Brandon Ar, NP Pager: [phone]   Please contact the urology consult pager with any further questions/concerns.

## 2024-01-07 NOTE — Consult Note (Signed)
 Cardiology Consultation   Patient ID: Brandon [name] MRN: [id number]; DOB: 16-Feb-1966  Admit date: 01/06/2024 Date of Consult: 01/07/2024  PCP:  Armando Gang, FNP   [facility] Providers Cardiologist:  None        Patient Profile:   Brandon [name] is a 58 y.o. male with a hx of CVA with residual right-sided hemiplegia, Carotid artery disease s/p L carotid stent, SAH, IPH on seizure prophylaxis, CAD with PCI/stent RCA, diabetes type 2, hx pancreatitis with pancreatic insufficiency, bilateral renal artery stenosis, hypertension, malnutrition, BPH, GERD, mood disorder who is being seen 01/07/2024 for the evaluation of tachycardia at the request of Dr. Lazarus Salines.  History of Present Illness:   Mr. Prasad presented from skilled nursing facility due to worsening low back pain with associated lower extremity weakness and numbness.  Is also been experiencing urinary changes with hematuria, abdominal pain and fevers.  He underwent CT abdomen pelvis for further workup which showed extensive emphysematous cystitis with extravesical extension as well as scattered abnormal gas in the spinal canal extending from T12 level through L5 level.  He was started on antibiotics with vancomycin and ceftriaxone which was expanded to vancomycin, cefepime and Flagyl.  WBC 25 and lactate 1.8.  He is being transferred from [facility] to [facility] emergency room to facilitate expedited MRI per neurosurgery recommendations.  He will also be seen by urology.  During his time in the emergency room, per report, he developed acute onset tachycardia with ventricular rates 200-210.  The first episode resolved with vagal maneuvers.  The second episode resolved with adenosine 6 mg.  An EKG of each event were obtained.  Per timeframe on the EKGs, the events happened about an hour apart from each other.  Further interpretation of the EKGs as below.  He also received 5 mg of IV metoprolol.  He takes metoprolol  tartrate 100 mg daily and this was fractionated to metoprolol 25 mg every 6 hours.  Following conversion, reportedly in sinus tachycardia with frequent PACs on telemetry.  He has received >2L of IVF.  At time of my visit, he was chest pain free and without dyspnea.  He states that he was completely asymptomatic during both episodes of tachycardia.  His last echocardiogram was in 2021 which revealed normal LV function, normal RV size and function, no significant valvular disease.  Past Medical History:  Diagnosis Date   Alcohol dependency (HCC)    Hx ETOH withdrawal seizures before 2011   CAD (coronary artery disease) 06/13/2012   Calcification noted on CTA of chest in 2012 Wall motion abnormality on ECHO     Carotid artery disease (HCC) 2016   bilateral.  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    Depression with anxiety    Diabetes mellitus without complication (HCC)    GERD (gastroesophageal reflux disease)    Headache(784.0)    migraine   Heart disease 02/2015   PCI/DES placed to RCA: on chronic Plavix/ASA   Hyperlipidemia    Hypertension    Pancreatitis 2011....    CT findings in May 2011 with inflammation and pseudocyst.  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/2016   Renal artery stenosis, native, bilateral (HCC) 02/2015    Past Surgical History:  Procedure Laterality Date   CARDIAC CATHETERIZATION N/A 03/15/2015   Procedure: Left Heart Cath;  Surgeon: Laurier Nancy, MD;  Location: [facility] INVASIVE  CV LAB;  Service: Cardiovascular;  Laterality: N/A;   CARDIAC CATHETERIZATION N/A 03/16/2015   Procedure: Coronary Stent Intervention;  Surgeon: Antonette Batters, MD;  Location: [facility] INVASIVE CV LAB;  Service: Cardiovascular;  Laterality: N/A;   CAROTID ANGIOGRAM N/A 06/15/2012   Procedure: CAROTID ANGIOGRAM;  Surgeon: Dannis Dy, MD;  Location: [facility] CATH LAB;  Service: Cardiovascular;   Laterality: N/A;   ESOPHAGOGASTRODUODENOSCOPY N/A 03/08/2016   Procedure: ESOPHAGOGASTRODUODENOSCOPY (EGD);  Surgeon: Danette Duos, MD;  Location: [facility] ENDOSCOPY;  Service: Gastroenterology;  Laterality: N/A;   none     PERIPHERAL VASCULAR CATHETERIZATION Left 04/06/2015   Procedure: Carotid PTA/Stent Intervention;  Surgeon: Celso College, MD;  Location: [facility] INVASIVE CV LAB;  Service: Cardiovascular;  Laterality: Left;     Home Medications:  Prior to Admission medications   Medication Sig Start Date End Date Taking? Authorizing Provider  acetaminophen (TYLENOL) 325 MG tablet Take 650 mg by mouth every 6 (six) hours as needed for mild pain (pain score 1-3) (cannot exceed a total of 3,000 mg/24 hours).   Yes [provider]  amoxicillin-clavulanate (AUGMENTIN) 875-125 MG tablet Take 1 tablet by mouth every 12 (twelve) hours. 01/05/24 01/15/24 Yes [provider]  ascorbic acid (VITAMIN C) 500 MG tablet Take 500 mg by mouth daily. 11/26/21  Yes [provider]  aspirin EC 81 MG tablet Take 1 tablet (81 mg total) by mouth daily. 08/13/23  Yes Cathey Clunes, MD  busPIRone (BUSPAR) 10 MG tablet Take 1 tablet (10 mg total) by mouth 2 (two) times daily. 12/23/20   Gherghe, Costin M, MD  clopidogrel (PLAVIX) 75 MG tablet Take 1 tablet (75 mg total) by mouth daily. 08/14/23   Cathey Clunes, MD  famotidine (PEPCID) 20 MG tablet Take 1 tablet (20 mg total) by mouth 2 (two) times daily. 12/23/20   Gherghe, Costin M, MD  feeding supplement (ENSURE ENLIVE / ENSURE PLUS) LIQD Take 237 mLs by mouth 3 (three) times daily with meals. 08/01/23   [name], [place], MD  FEROSUL 325 (65 Fe) MG tablet Take 1 tablet (325 mg total) by mouth daily. 12/23/20   Gherghe, Costin M, MD  folic acid (FOLVITE) 1 MG tablet Take 1 tablet (1 mg total) by mouth daily. 12/23/20   Gherghe, Costin M, MD  HM LIDOCAINE PATCH EX Apply 1 patch topically daily. Apply to left shoulder for pain -  5% patch    [provider]  insulin glargine (LANTUS) 100 UNIT/ML Solostar Pen Inject 27 Units into the skin at bedtime.    [provider]  insulin lispro (HUMALOG) 100 UNIT/ML injection Inject 2-10 Units into the skin 3 (three) times daily before meals. Per sliding scale: 70-130= 0 units, 131-180= 2 units, 181-240= 4 units, 241-300= 6 units, 301-350= 8 units, 351-400= 10 units    [provider]  levETIRAcetam (KEPPRA) 500 MG tablet Take 1 tablet (500 mg total) by mouth 2 (two) times daily for 2 days. 08/04/23 01/06/24  Cathey Clunes, MD  lipase/protease/amylase (CREON) 36000 UNITS CPEP capsule Take 1 capsule (36,000 Units total) by mouth 3 (three) times daily with meals. 08/01/23   [name], [place], MD  metFORMIN (GLUCOPHAGE) 1000 MG tablet Take 1 tablet (1,000 mg total) by mouth 2 (two) times daily with a meal. 12/23/20   Gherghe, Costin M, MD  metoprolol tartrate (LOPRESSOR) 100 MG tablet Take 100 mg by mouth daily. 10/25/21   [provider]  Multiple Vitamin (MULTIVITAMIN WITH MINERALS) TABS tablet Take 1 tablet by  mouth daily. 12/23/20   Gherghe, Costin M, MD  mupirocin ointment (BACTROBAN) 2 % Apply topically 2 (two) times daily. 08/01/23   [name], [place], MD  nicotine (NICODERM CQ - DOSED IN MG/24 HOURS) 14 mg/24hr patch Place 1 patch (14 mg total) onto the skin daily. 08/02/23   [name], [place], MD  Nystatin (GERHARDT'S BUTT CREAM) CREA Apply 1 Application topically 2 (two) times daily. 08/01/23   [name], [place], MD  rosuvastatin (CRESTOR) 5 MG tablet Take 5 mg by mouth at bedtime.    [provider]  senna-docusate (SENOKOT-S) 8.6-50 MG tablet Take 1 tablet by mouth 2 (two) times daily. 08/01/23   [name], [place], MD  tamsulosin (FLOMAX) 0.4 MG CAPS capsule Take 1 capsule (0.4 mg total) by mouth daily after supper. 08/01/23   [name], [place], MD  traZODone (DESYREL) 150 MG tablet  Take 1 tablet (150 mg total) by mouth at bedtime. 08/01/23   Jose Ngo, MD    Inpatient Medications: Scheduled Meds:  adenosine       adenosine (ADENOCARD) IV  3 mg Intravenous Once   busPIRone  10 mg Oral BID   famotidine  20 mg Oral BID   levETIRAcetam  500 mg Oral BID   lipase/protease/amylase  36,000 Units Oral TID WC   metoprolol tartrate  25 mg Oral Q6H   metoprolol tartrate  50 mg Oral Once   nicotine  14 mg Transdermal Daily   rosuvastatin  5 mg Oral QHS   sodium chloride flush  3 mL Intravenous Q12H   tamsulosin  0.4 mg Oral QPC supper   traZODone  150 mg Oral QHS   Continuous Infusions:  ceFEPime (MAXIPIME) IV 2 g (01/06/24 2231)   dextrose 5% lactated ringers     insulin 14 Units/hr (01/06/24 2106)   lactated ringers 125 mL/hr at 01/06/24 2107   magnesium sulfate bolus IVPB     metronidazole 500 mg (01/06/24 2110)   vancomycin Stopped (01/06/24 2030)   PRN Meds: adenosine, acetaminophen, albuterol, dextrose, HYDROmorphone (DILAUDID) injection, metoprolol tartrate, oxyCODONE **OR** oxyCODONE  Allergies:   No Known Allergies  Social History:   Social History   Socioeconomic History   Marital status: Single    Spouse name: Dany Dyke   Number of children: 0   Years of education: 12   Highest education level: Not on file  Occupational History   Occupation: unemployed    Comment: previously worked at [employer]  Tobacco Use   Smoking status: Every Day    Current packs/day: 1.00    Average packs/day: 1 pack/day for 30.0 years (30.0 ttl pk-yrs)    Types: Cigarettes   Smokeless tobacco: Never   Tobacco comments:    smoking less  Substance and Sexual Activity   Alcohol use: No    Alcohol/week: 6.0 standard drinks of alcohol    Types: 6 Cans of beer per week    Comment: drinks 6-12 beer daily   Drug use: No   Sexual activity: Yes    Birth control/protection: None  Other Topics Concern   Not on file  Social History Narrative   Lives in  [place] with his sister.   Unemployed, previously worked Surveyor, minerals down [employer]   Completed high school and some college    Patient is right-handed.   Patients one cup of coffee every morning.      Social Drivers of Corporate investment banker Strain: Not on file  Food Insecurity: Food Insecurity Present (07/30/2023)   Hunger Vital Sign  Worried About Programme researcher, broadcasting/film/video in the Last Year: Often true    Barista in the Last Year: Often true  Transportation Needs: Unmet Transportation Needs (07/30/2023)   PRAPARE - Administrator, Civil Service (Medical): Yes    Lack of Transportation (Non-Medical): Yes  Physical Activity: Inactive (11/15/2021)   Exercise Vital Sign    Days of Exercise per Week: 0 days    Minutes of Exercise per Session: 0 min  Stress: No Stress Concern Present (10/31/2021)   [employer] of Occupational Health - Occupational Stress Questionnaire    Feeling of Stress : Not at all  Social Connections: Not on file  Intimate Partner Violence: Not At Risk (07/30/2023)   Humiliation, Afraid, Rape, and Kick questionnaire    Fear of Current or Ex-Partner: No    Emotionally Abused: No    Physically Abused: No    Sexually Abused: No    Family History:    Family History  Problem Relation Age of Onset   Stroke Mother        deceased   Coronary artery disease Mother    Alcohol abuse Mother    Cancer Mother    Hypertension Father        alive   Alcohol abuse Father    Diabetes Father    Kidney disease Father    Stroke Maternal Grandmother      ROS:  Please see the history of present illness.   All other ROS reviewed and negative.     Physical Exam/Data:   Vitals:   01/06/24 2306 01/06/24 2315 01/06/24 2320 01/06/24 2322  BP: (!) 81/37 115/82  115/80  Pulse:  (!) 57 100 91  Resp: (!) 25 (!) 24 18 (!) 26  Temp:      TempSrc:      SpO2:  97% 96% 97%    Intake/Output Summary (Last 24 hours) at 01/07/2024 0002 Last data filed  at 01/06/2024 2030 Gross per 24 hour  Intake 2300 ml  Output --  Net 2300 ml      12/31/2023   11:17 PM 07/31/2023    5:00 AM 07/30/2023    7:45 PM  Last 3 Weights  Weight (lbs) 142 lb 160 lb 4.4 oz 200 lb  Weight (kg) 64.411 kg 72.7 kg 90.719 kg     There is no height or weight on file to calculate BMI.  General:  weak and frail appearing HEENT: poor dentition Neck: no JVD Cardiac:  tachycardic and regular with 1/6 systolic murmur Lungs:  clear to auscultation anteriorly Abd: active bowel sounds  Ext: no edema Musculoskeletal:  able to wiggle toes but cannot move anti-gravity Skin: warm and dry  Neuro:  face symmetric and speech fluent  EKG:  The EKG was personally reviewed and demonstrates:    2209 EKG on 01/06/24: Regular, narrow complex tachycardia with ventricular rates 210.  Interestingly, QRS vector is positive in V1 and aVR and negative in the inferior leads.  QRS morphology looks widened in V1 and aVR though looks narrow in other leads.  There is not negative for positive concordance in the precordial leads.  Taken together, I believe this is SVT.  2303 EKG: Regular, narrow complex SVT with ST segment depressions in II, III, aVF, V3-V6.  Telemetry:  Telemetry was personally reviewed and demonstrates:  2 episodes of SVT - see below for additional details  Relevant CV Studies: 01/2020 Echo 1. Left ventricular ejection fraction, by estimation, is  55 to 60%. The  left ventricle has normal function. The left ventricle has no regional  wall motion abnormalities. Left ventricular diastolic parameters were  normal.   2. Right ventricular systolic function is normal. The right ventricular  size is normal.   3. Left atrial size was mildly dilated.   4. The mitral valve is normal in structure. Trivial mitral valve  regurgitation. No evidence of mitral stenosis.   5. The aortic valve is grossly normal. Aortic valve regurgitation is not  visualized. Mild aortic valve sclerosis is  present, with no evidence of  aortic valve stenosis.    Laboratory Data:  High Sensitivity Troponin:   Recent Labs  Lab 01/06/24 2236  TROPONINIHS 105*     Chemistry Recent Labs  Lab 01/06/24 1214 01/06/24 2236  NA 129* 129*  K 4.1 3.6  CL 92* 93*  CO2 21* 21*  GLUCOSE 307* 353*  BUN 31* 26*  CREATININE 0.78 0.82  CALCIUM 8.7* 8.6*  MG  --  1.7  GFRNONAA >60 >60  ANIONGAP 16* 15    Recent Labs  Lab 01/06/24 1214  PROT 7.5  ALBUMIN 2.3*  AST 90*  ALT 41  ALKPHOS 191*  BILITOT 1.2   Lipids No results for input(s): "CHOL", "TRIG", "HDL", "LABVLDL", "LDLCALC", "CHOLHDL" in the last 168 hours.  Hematology Recent Labs  Lab 01/06/24 1214 01/06/24 2236  WBC 25.3* 26.5*  RBC 3.80* 3.71*  HGB 11.4* 11.2*  HCT 35.2* 34.8*  MCV 92.6 93.8  MCH 30.0 30.2  MCHC 32.4 32.2  RDW 14.5 14.7  PLT PLATELET CLUMPS NOTED ON SMEAR, UNABLE TO ESTIMATE 478*   Thyroid No results for input(s): "TSH", "FREET4" in the last 168 hours.  BNPNo results for input(s): "BNP", "PROBNP" in the last 168 hours.  DDimer No results for input(s): "DDIMER" in the last 168 hours.   Radiology/Studies:  CT ABDOMEN PELVIS W CONTRAST Result Date: 01/06/2024 CLINICAL DATA:  Urinary tract infection, recent change in antibiotics. Back pain/flank pain. EXAM: CT ABDOMEN AND PELVIS WITH CONTRAST TECHNIQUE: Multidetector CT imaging of the abdomen and pelvis was performed using the standard protocol following bolus administration of intravenous contrast. RADIATION DOSE REDUCTION: This exam was performed according to the departmental dose-optimization program which includes automated exposure control, adjustment of the mA and/or kV according to patient size and/or use of iterative reconstruction technique. CONTRAST:  100mL OMNIPAQUE IOHEXOL 300 MG/ML  SOLN COMPARISON:  Lumbar radiographs 12/31/2023 and CT abdomen 02/06/2020 FINDINGS: The patient is contracted and rotated on today's exam with some resulting reduced  diagnostic accuracy. Lower chest: Coronary and descending thoracic aortic atherosclerosis. Distal esophageal wall thickening is nonspecific although esophagitis would be a common cause. Airway plugging in the right lower lobe with bandlike irregular right lower lobe opacity likely reflecting mild localized pneumonia. Lesser degree of airway plugging in the left lower lobe. Trace left pleural thickening posteriorly on image 16 series 2, nonspecific. Hepatobiliary: Intrahepatic biliary dilatation similar to previous without definite substantial extrahepatic biliary dilatation. Cavernous transformation of the portal vein. No definite focal liver mass identified. Pancreas: Heterogeneous density in the pancreas diffusely, pancreatitis is not excluded. Scattered calcifications likely reflect a chronic component of pancreatitis. Correlate with lipase levels. No obvious abscess or pseudocyst. Spleen: Unremarkable Adrenals/Urinary Tract: Fullness of the adrenal glands without discrete mass. Relative left renal atrophy. Indistinctly marginated parenchymal hypodensity in the right mid kidney on image 101 series 8 and also shown on image 32 series 2, suspicious for pyelonephritis. No hydronephrosis or hydroureter.  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 No well-defined drainable abscess. No free air in the peritoneal cavity identified. Stomach/Bowel: Prominent stool throughout the colon favors constipation. Normal appendix. No imaging findings of pseudomembranous colitis. 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 No out right complete occlusion.  Celiac trunk appears patent. Aortic and proximal renal artery atheromatous vascular calcifications. Reproductive: Unremarkable Other: No supplemental non-categorized findings. Musculoskeletal: Prior interbody fusion at T9-T10-T11. Scattered abnormal gas in the spinal canal extending from the T12 level through the L5 level, some peripheral and some central in distribution in the canal. Cannot exclude intradural gas or regional spinal canal abscess, although I do not see new definite bony destructive findings along the endplates to further indicate recurrent discitis. Lumbar MRI with and without contrast is recommended for further characterization. IMPRESSION: 1. 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 No well-defined drainable pelvic abscess. 2. Indistinctly marginated parenchymal hypodensity in the right mid kidney, suspicious for pyelonephritis. 3. Scattered abnormal gas in the spinal canal extending from the T12 level through the L5 level, some peripheral and some central in distribution in the canal. Cannot exclude intradural gas or regional spinal canal abscess, although I do not see new definite bony destructive findings along the endplates to further indicate recurrent discitis. Lumbar MRI with and without contrast is recommended for further characterization, specifically to assess for spinal infection. 4. Heterogeneous density in the pancreas diffusely, pancreatitis is not excluded. Scattered calcifications likely reflect a chronic component of pancreatitis. Correlate with lipase levels. 5. Airway plugging in the right lower lobe with bandlike irregular right lower lobe opacity likely reflecting mild localized pneumonia. Lesser degree of airway plugging in the left lower lobe. 6. 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 7. Prominent stool throughout the colon favors constipation. 8. Coronary and descending thoracic aortic atherosclerosis. Aortic Atherosclerosis (ICD10-I70.0). 9. Distal esophageal wall thickening is nonspecific although esophagitis would be a common cause. Electronically Signed   By: Freida Jes M.D.   On: 01/06/2024 17:04     Assessment and Plan:   Supraventricular tachycardia Presenting with back pain, paraplegia and urinary symptoms with CT abdomen pelvis showing extensive emphysematous cystitis with extravesical extension as well as scattered abnormal gas in the spinal canal extending from T12 level through L5 level.  He has had 2 episodes of tachycardia with ventricular rates in the 200-210 range.  Review of telemetry of the second episode shows a regular, narrow complex tachycardia that was initiated with a PAC consistent with SVT.  Episode terminated with adenosine and no flutter waves noted with adenosine.  Now in sinus/sinus tachycardia.  There are no telemetry strip of the onset of the first episode (leads were placed after onset).  The first episode was terminated with vagal maneuvers.  There does appear to be some aberrancy with the first episode but no fusion beats or capture beats ruling against VT and overall, QRS morphology during the episode looks similar to when in sinus rhythm.  He has no known history  of reduced LVEF.  His episodes today were asymptomatic. Events likes precipitated by infection. He is s/p IV metoprolol 5mg  x1 and IVF. - fractionate metoprolol to 25mg  q6hrs - if recurrence, attempt vagal maneuvers and IV metoprolol 5mg ; may require adenosine 6mg  as this worked previously - Can increase PO metoprolol to 25mg  TID if recurrence as well - Keep K >4 and mag >2 --> K currently 3.6 and mag 1.7, recommend supplementation - remain on telemetry - echo  CAD s/p RCA stent in 2016 Carotid artery disease with prior stent His PCI occurred  almost 9 years ago and he is not currently having any anginal symptoms.  From a cardiology perspective, okay to hold home aspirin and Plavix in anticipation of possible intervention in the near future.    Risk Assessment/Risk Scores:            [facility] will sign off.   Medication Recommendations:  as above Other recommendations (labs, testing, etc):  echo Follow up as an outpatient:  no  For questions or updates, please contact [facility] Please consult [email or web address] for contact info under    Signed, Chandler Combs, MD  01/07/2024 12:02 AM

## 2024-01-07 NOTE — ED Notes (Signed)
 5W given a courtesy call.

## 2024-01-07 NOTE — Consult Note (Addendum)
 Date of Admission:  01/06/2024          Reason for Consult: Tensive epidural abscess from T10-11 to L5-S1, emphysematous pyelonephritis and ESBL seal pneumonia bacteremia with sepsis   Referring Provider: Toni Amend, MD   Assessment:  ESBL Klebsiella bacteremia with sepsis Edematous pyelonephritis that is close to an area of extensive Epidural abscess from T10-11 to L5-S1 T spine diskitis Prior history of pneumococcal empyema discitis and ventral epidural abscess Send history of stroke with paraplegia and bedbound status Coronary artery disease Diabetes mellitus Pancreatic insufficiency Bilateral renal artery stenosis   Plan:  Change antibiotics to meropenem Check sed rate CRP Repeat blood cultures tomorrow Whether IR could approach epidural abscess so that aspirate could be sent for culture to see if any other organisms not covered by his meropenem are involved Would strongly consider Palliative Care consult at discretion of primary Contact precautions for ESBL  Principal Problem:   Epidural abscess Active Problems:   Wide-complex tachycardia   Diskitis   Uncontrolled type 2 diabetes mellitus with hyperglycemia, with long-term current use of insulin (HCC)   Emphysematous cystitis   Abnormal findings on diagnostic imaging of spine   Acute pyelonephritis   Paraplegia (HCC)   Scheduled Meds:  adenosine (ADENOCARD) IV  3 mg Intravenous Once   busPIRone  10 mg Oral BID   famotidine  20 mg Oral BID   levETIRAcetam  500 mg Oral BID   lipase/protease/amylase  36,000 Units Oral TID WC   metoprolol tartrate  25 mg Oral Q6H   nicotine  14 mg Transdermal Daily   rosuvastatin  5 mg Oral QHS   sodium chloride flush  3 mL Intravenous Q12H   tamsulosin  0.4 mg Oral QPC supper   traZODone  150 mg Oral QHS   Continuous Infusions:  dextrose 5% lactated ringers 125 mL/hr at 01/07/24 0918   insulin 3.8 Units/hr (01/07/24 1423)   lactated ringers Stopped (01/07/24  0039)   meropenem (MERREM) IV Stopped (01/07/24 1300)   PRN Meds:.acetaminophen, albuterol, dextrose, HYDROmorphone (DILAUDID) injection, LORazepam, metoprolol tartrate, oxyCODONE **OR** oxyCODONE  HPI: Brandon [name] is a 58 y.o. male with extensive past medical history including alcohol dependency coronary artery disease CVA right-sided hemiplegia coronary disease status post PCI and stent to the RCA diabetes mellitus chronic pancreatitis bilateral renal artery stenosis hypertension who had previously been treated by my partner Dr. Marcha Dutton for penicillin intermediate Streptococcus pneumonia empyema with discitis and ventral epidural abscess pleated 6 weeks of parenteral ceftriaxone and then was changed over to cephalexin to complete additional 6 to 8 weeks.  Right residing in a skilled nursing facility and presented from there with worsening lower back pain.  In the ER he is found to have septic physiology blood cultures were taken and these have already begun growing ESBL CL pneumonia.  CT of the abdomen pelvis was performed  This showed:  IMPRESSION: 1. 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 No well-defined drainable pelvic abscess. 2. Indistinctly marginated parenchymal hypodensity in the right mid kidney, suspicious for pyelonephritis. 3. Scattered abnormal gas in the spinal canal extending from the T12 level through the L5 level, some peripheral and some central in distribution in the canal. Cannot exclude intradural gas or regional spinal canal abscess, although I do not see new definite bony  destructive findings along the endplates to further indicate recurrent discitis. Lumbar MRI with and without contrast is recommended for further characterization, specifically to assess for spinal infection. 4.  Heterogeneous density in the pancreas diffusely, pancreatitis is not excluded. Scattered calcifications likely reflect a chronic component of pancreatitis. Correlate with lipase levels. 5. Airway plugging in the right lower lobe with bandlike irregular right lower lobe opacity likely reflecting mild localized pneumonia. Lesser degree of airway plugging in the left lower lobe. 6. 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 7. Prominent stool throughout the colon favors constipation. 8. Coronary and descending thoracic aortic atherosclerosis. Aortic Atherosclerosis (ICD10-I70.0). 9. Distal esophageal wall thickening is nonspecific although esophagitis would be a common cause.    MRI of the C thru L spine was performed IMPRESSION: Cervical spine:   No acute finding.  No evidence of discitis or abscess.   Very motion degraded MRI still positive for moderate degenerative spinal stenosis at C3-4 and C4-5. Foraminal stenoses at C3-4 to C5-6.   Absent right carotid and vertebral flow voids, chronic when compared to 2023.   Thoracic and lumbar spine:   Epidural abscess spanning T10-11 to L5-S1, primarily posterior canal in the upper extent and in the anterior canal at the lower extent. At the level of L3, ventral abscess measures up to 7 mm in thickness and contains gas. There is some disc edematous signal at L4-5 and L5-S1, but no clear discitis, suspect direct paravertebral seeding from UTI based on prior abdominal CT. The lower cord and cauda equina is diffusely compressed by the collection.   Patient had been placed on vancomycin and cefepime and metronidazole.  Neurosurgery had been consulted and Dr. Danielle Dess seen the patient and see no benefit to neurosurgical intervention.  You have also seen the patient and has been placed.  As mentioned above radiology queried where there the emphysematous pyelonephritis could have extended  infection into the epidural space though there is not mention of a frank fistulous connection.    Given the patient has an ESBL Klebsiella pneumonia we are changing him over to meropenem.  We will stop the vancomycin at present.  I wonder if IR would at all be able to approach to the epidural abscess to aspirate for culture I cannot recall them aspirating an epidural abscess and recent history.  Patient appears to have a very poor quality of life at baseline and I am not optimistic about his overall prognosis.  It would be prudent during the course of his hospitalization to involve palliative care in my opinion.  Will repeat blood cultures  Again query if IR could approach epidural abscess for aspirate so culture could be taken to see if other organisms not covered by merrem are in play in his T spine  Will check ESR, CRP  I have personally spent 84 minutes involved in face-to-face and non-face-to-face activities for this patient on the day of the visit. Professional time spent includes the following activities: Preparing to see the patient (review of tests), Obtaining and/or reviewing separately obtained history (admission/discharge record), Performing a medically appropriate examination and/or evaluation , Ordering medications/tests/procedures, referring and communicating with other health care professionals, Documenting clinical information in the EMR, Independently interpreting results (not separately reported), Communicating results to the patient/family/caregiver, Counseling and educating the patient/family/caregiver and Care coordination (not separately reported).   Evaluation of the patient requires complex antimicrobial therapy evaluation, counseling , isolation needs to reduce disease transmission and risk assessment and mitigation.  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    Depression with anxiety    Diabetes mellitus without complication (HCC)    GERD (gastroesophageal reflux disease)    Headache(784.0)    migraine   Heart disease 02/2015   PCI/DES placed to RCA: on chronic Plavix/ASA   Hyperlipidemia    Hypertension    Pancreatitis 2011....    CT findings in May 2011 with inflammation and pseudocyst.  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/2016   Renal artery stenosis, native, bilateral (HCC) 02/2015    Social History   Tobacco Use   Smoking status: Every Day    Current packs/day: 1.00    Average packs/day: 1 pack/day for 30.0 years (30.0 ttl pk-yrs)    Types: Cigarettes   Smokeless tobacco: Never   Tobacco comments:    smoking less  Substance Use Topics   Alcohol use: No    Alcohol/week: 6.0 standard drinks of alcohol    Types: 6 Cans of beer per week    Comment: drinks 6-12 beer daily   Drug use: No    Family History  Problem Relation Age of Onset   Stroke Mother        deceased   Coronary artery disease Mother    Alcohol abuse Mother    Cancer Mother    Hypertension Father        alive   Alcohol abuse Father    Diabetes Father    Kidney disease Father    Stroke Maternal Grandmother    No Known Allergies  OBJECTIVE: Blood pressure 125/80, pulse 83, temperature 97.6 F (36.4 C), temperature source Oral, resp. rate 17, SpO2 98%.  Physical Exam Constitutional:      Appearance: He is ill-appearing and toxic-appearing.  Cardiovascular:     Rate and Rhythm: Tachycardia present.     Heart sounds: No murmur heard.    No friction rub. No gallop.  Pulmonary:     Effort: No respiratory  distress.     Breath sounds: No wheezing.  Abdominal:     General: There is no distension.  Neurological:     Mental Status: He is disoriented.     Comments: Hemiplegic and bed bound  Psychiatric:        Attention and Perception: He is inattentive.        Mood and Affect: Affect is labile.        Speech: Speech is delayed and tangential.        Behavior: Behavior is agitated.        Thought Content: Thought content is paranoid.        Cognition and Memory: Cognition is impaired. Memory is impaired. He exhibits impaired recent memory and impaired remote memory.        Judgment: Judgment is impulsive and inappropriate.      Lab Results Lab Results  Component Value Date   WBC 26.0 (H) 01/07/2024   HGB 9.8 (L) 01/07/2024   HCT 29.3 (L) 01/07/2024  MCV 91.3 01/07/2024   PLT 436 (H) 01/07/2024    Lab Results  Component Value Date   CREATININE 0.55 (L) 01/07/2024   BUN 19 01/07/2024   NA 130 (L) 01/07/2024   K 3.1 (L) 01/07/2024   CL 95 (L) 01/07/2024   CO2 21 (L) 01/07/2024    Lab Results  Component Value Date   ALT 41 01/06/2024   AST 90 (H) 01/06/2024   ALKPHOS 191 (H) 01/06/2024   BILITOT 1.2 01/06/2024     Microbiology: Recent Results (from the past 240 hours)  Blood culture (routine x 2)     Status: None (Preliminary result)   Collection Time: 01/06/24  1:14 PM   Specimen: BLOOD  Result Value Ref Range Status   Specimen Description   Final    BLOOD SITE NOT SPECIFIED Performed at [facility], [address] [place], [place] [address]    Special Requests   Final    BOTTLES DRAWN AEROBIC AND ANAEROBIC Blood Culture adequate volume Performed at [facility], [address] [place], [place] [address]    Culture  Setup Time   Final    GRAM NEGATIVE RODS IN BOTH AEROBIC AND ANAEROBIC BOTTLES CRITICAL RESULT CALLED TO, READ BACK BY AND VERIFIED WITH: RN ED AT [facility] R[facility] [id number] AT 1055 BY CM Performed at [facility], [address] [place], [place] [address]    Culture GRAM NEGATIVE RODS  Final   Report Status PENDING  Incomplete  Blood Culture ID Panel (Reflexed)     Status: Abnormal (Preliminary result)   Collection Time: 01/06/24  1:14 PM  Result Value Ref Range Status   Enterococcus faecalis NOT DETECTED NOT DETECTED Final   Enterococcus Faecium NOT DETECTED NOT DETECTED Final   Listeria monocytogenes NOT DETECTED NOT DETECTED Final   Staphylococcus species NOT DETECTED NOT DETECTED Final   Staphylococcus aureus (BCID) NOT DETECTED NOT DETECTED Final   Staphylococcus epidermidis NOT DETECTED NOT DETECTED Final   Staphylococcus lugdunensis NOT DETECTED NOT DETECTED Final   Streptococcus species NOT DETECTED NOT DETECTED Final   Streptococcus agalactiae NOT DETECTED NOT DETECTED Final   Streptococcus pneumoniae NOT DETECTED NOT DETECTED Final   Streptococcus pyogenes NOT DETECTED NOT DETECTED Final   A.calcoaceticus-baumannii NOT DETECTED NOT DETECTED Final   Bacteroides fragilis NOT DETECTED NOT DETECTED Final   Enterobacterales PENDING NOT DETECTED Incomplete   Enterobacter cloacae complex NOT DETECTED NOT DETECTED Final   Escherichia coli NOT DETECTED NOT DETECTED Final   Klebsiella aerogenes NOT DETECTED NOT DETECTED Final   Klebsiella oxytoca NOT DETECTED NOT DETECTED Final   Klebsiella pneumoniae DETECTED (A) NOT DETECTED Final    Comment: CRITICAL RESULT CALLED TO, READ BACK BY AND VERIFIED WITH: RN ED AT [facility]J RIVER [id number] AT 1055 BY CM    Proteus species NOT DETECTED NOT DETECTED Final   Salmonella species NOT DETECTED NOT DETECTED Final   Serratia marcescens NOT DETECTED NOT DETECTED Final   Haemophilus influenzae NOT DETECTED NOT DETECTED Final   Neisseria meningitidis NOT DETECTED NOT DETECTED Final   Pseudomonas aeruginosa NOT DETECTED NOT DETECTED Final   Stenotrophomonas maltophilia NOT DETECTED NOT DETECTED Final   Candida albicans NOT DETECTED NOT DETECTED Final    Candida auris NOT DETECTED NOT DETECTED Final   Candida glabrata NOT DETECTED NOT DETECTED Final   Candida krusei NOT DETECTED NOT DETECTED Final   Candida parapsilosis NOT DETECTED NOT DETECTED Final   Candida tropicalis NOT DETECTED  NOT DETECTED Final   Cryptococcus neoformans/gattii NOT DETECTED NOT DETECTED Final   CTX-M ESBL DETECTED (A) NOT DETECTED Final    Comment: CRITICAL RESULT CALLED TO, READ BACK BY AND VERIFIED WITH: RN ED AT [facility] TJ RIVER [id number] AT 1055 BY CM (NOTE) Extended spectrum beta-lactamase detected. Recommend a carbapenem as initial therapy.      Carbapenem resistance IMP NOT DETECTED NOT DETECTED Final   Carbapenem resistance KPC NOT DETECTED NOT DETECTED Final   Carbapenem resistance NDM NOT DETECTED NOT DETECTED Final   Carbapenem resist OXA 48 LIKE NOT DETECTED NOT DETECTED Final   Carbapenem resistance VIM NOT DETECTED NOT DETECTED Final    Comment: Performed at [facility], [address] [place], [place] [address]  Blood culture (routine x 2)     Status: None (Preliminary result)   Collection Time: 01/06/24  4:16 PM   Specimen: BLOOD LEFT HAND  Result Value Ref Range Status   Specimen Description   Final    BLOOD LEFT HAND Performed at [facility], [address] [place], [place] [address]    Special Requests   Final    BOTTLES DRAWN AEROBIC AND ANAEROBIC Blood Culture adequate volume Performed at [facility], [address], [place], [place] [address]    Culture   Final    NO GROWTH < 24 HOURS Performed at [facility], [address], [place], [place] [address]    Report Status PENDING  Incomplete    Sheela Denmark, MD [facility] [phone] pager  01/07/2024, 2:30 PM

## 2024-01-07 NOTE — Progress Notes (Signed)
 Pharmacy: Antimicrobial Stewardship Note  5 YOM who presented from SNF on 4/15 with worsening back pain. Abd CT concerning for emphysematous cystitis and spine MRI showing epidural abscess  Hx prior Strep PNA empyema/discitis/ventral epidural abscess in 2021 - received Ceftriaxone x 6 wks followed by oral keflex for 6-8 weeks  Blood cultures showing this admit showing 2 of 4 bottles (isolated to 1 set) with GNR >> BCID detecting ESBL Kleb PNA (as below). BCID call made to [facility]. Will adjust antibiotics to Meropenem to cover for this.    Plan - Adjust antibiotics to Meropenem 1g IV every 8 hours - Will follow-up on continued work-up and LOT plans  Results for orders placed or performed during the hospital encounter of 01/06/24  Blood Culture ID Panel (Reflexed) (Collected: 01/06/2024  1:14 PM)  Result Value Ref Range   Enterococcus faecalis NOT DETECTED NOT DETECTED   Enterococcus Faecium NOT DETECTED NOT DETECTED   Listeria monocytogenes NOT DETECTED NOT DETECTED   Staphylococcus species NOT DETECTED NOT DETECTED   Staphylococcus aureus (BCID) NOT DETECTED NOT DETECTED   Staphylococcus epidermidis NOT DETECTED NOT DETECTED   Staphylococcus lugdunensis NOT DETECTED NOT DETECTED   Streptococcus species NOT DETECTED NOT DETECTED   Streptococcus agalactiae NOT DETECTED NOT DETECTED   Streptococcus pneumoniae NOT DETECTED NOT DETECTED   Streptococcus pyogenes NOT DETECTED NOT DETECTED   A.calcoaceticus-baumannii NOT DETECTED NOT DETECTED   Bacteroides fragilis NOT DETECTED NOT DETECTED   Enterobacterales PENDING NOT DETECTED   Enterobacter cloacae complex NOT DETECTED NOT DETECTED   Escherichia coli NOT DETECTED NOT DETECTED   Klebsiella aerogenes NOT DETECTED NOT DETECTED   Klebsiella oxytoca NOT DETECTED NOT DETECTED   Klebsiella pneumoniae DETECTED (A) NOT DETECTED   Proteus species NOT DETECTED NOT DETECTED   Salmonella species NOT DETECTED NOT DETECTED   Serratia marcescens NOT  DETECTED NOT DETECTED   Haemophilus influenzae NOT DETECTED NOT DETECTED   Neisseria meningitidis NOT DETECTED NOT DETECTED   Pseudomonas aeruginosa NOT DETECTED NOT DETECTED   Stenotrophomonas maltophilia NOT DETECTED NOT DETECTED   Candida albicans NOT DETECTED NOT DETECTED   Candida auris NOT DETECTED NOT DETECTED   Candida glabrata NOT DETECTED NOT DETECTED   Candida krusei NOT DETECTED NOT DETECTED   Candida parapsilosis NOT DETECTED NOT DETECTED   Candida tropicalis NOT DETECTED NOT DETECTED   Cryptococcus neoformans/gattii NOT DETECTED NOT DETECTED   CTX-M ESBL DETECTED (A) NOT DETECTED   Carbapenem resistance IMP NOT DETECTED NOT DETECTED   Carbapenem resistance KPC NOT DETECTED NOT DETECTED   Carbapenem resistance NDM NOT DETECTED NOT DETECTED   Carbapenem resist OXA 48 LIKE NOT DETECTED NOT DETECTED   Carbapenem resistance VIM NOT DETECTED NOT DETECTED    Thank you for allowing pharmacy to be a part of this patient's care.  Garland Junk, PharmD, BCPS, BCIDP Infectious Diseases Clinical Pharmacist 01/07/2024 3:26 PM   **Pharmacist phone directory can now be found on [email or web address] ([email or web address]).  Listed under [facility].

## 2024-01-08 DIAGNOSIS — R109 Unspecified abdominal pain: Principal | ICD-10-CM

## 2024-01-08 DIAGNOSIS — M4620 Osteomyelitis of vertebra, site unspecified: Secondary | ICD-10-CM | POA: Diagnosis not present

## 2024-01-08 DIAGNOSIS — E1165 Type 2 diabetes mellitus with hyperglycemia: Secondary | ICD-10-CM | POA: Diagnosis not present

## 2024-01-08 DIAGNOSIS — R3989 Other symptoms and signs involving the genitourinary system: Secondary | ICD-10-CM | POA: Diagnosis not present

## 2024-01-08 DIAGNOSIS — B953 Streptococcus pneumoniae as the cause of diseases classified elsewhere: Secondary | ICD-10-CM

## 2024-01-08 DIAGNOSIS — G062 Extradural and subdural abscess, unspecified: Secondary | ICD-10-CM | POA: Diagnosis not present

## 2024-01-08 DIAGNOSIS — N3080 Other cystitis without hematuria: Secondary | ICD-10-CM | POA: Diagnosis not present

## 2024-01-08 DIAGNOSIS — I471 Supraventricular tachycardia, unspecified: Secondary | ICD-10-CM | POA: Diagnosis not present

## 2024-01-08 LAB — COMPREHENSIVE METABOLIC PANEL WITH GFR
ALT: 87 U/L — ABNORMAL HIGH (ref 0–44)
AST: 185 U/L — ABNORMAL HIGH (ref 15–41)
Albumin: 1.5 g/dL — ABNORMAL LOW (ref 3.5–5.0)
Alkaline Phosphatase: 205 U/L — ABNORMAL HIGH (ref 38–126)
Anion gap: 15 (ref 5–15)
BUN: 14 mg/dL (ref 6–20)
CO2: 22 mmol/L (ref 22–32)
Calcium: 8.4 mg/dL — ABNORMAL LOW (ref 8.9–10.3)
Chloride: 96 mmol/L — ABNORMAL LOW (ref 98–111)
Creatinine, Ser: 0.65 mg/dL (ref 0.61–1.24)
GFR, Estimated: >60 mL/min (ref >60)
Glucose, Bld: 228 mg/dL — ABNORMAL HIGH (ref 70–99)
Potassium: 3.1 mmol/L — ABNORMAL LOW (ref 3.5–5.1)
Sodium: 133 mmol/L — ABNORMAL LOW (ref 135–145)
Total Bilirubin: 0.9 mg/dL (ref 0.0–1.2)
Total Protein: 5.6 g/dL — ABNORMAL LOW (ref 6.5–8.1)

## 2024-01-08 LAB — GLUCOSE, CAPILLARY
Glucose-Capillary: 247 mg/dL — ABNORMAL HIGH (ref 70–99)
Glucose-Capillary: 236 mg/dL — ABNORMAL HIGH (ref 70–99)
Glucose-Capillary: 219 mg/dL — ABNORMAL HIGH (ref 70–99)
Glucose-Capillary: 290 mg/dL — ABNORMAL HIGH (ref 70–99)

## 2024-01-08 LAB — PROTIME-INR
INR: 1.5 — ABNORMAL HIGH (ref 0.8–1.2)
Prothrombin Time: 17.9 s — ABNORMAL HIGH (ref 11.4–15.2)

## 2024-01-08 LAB — CBC
HCT: 28.6 % — ABNORMAL LOW (ref 39.0–52.0)
Hemoglobin: 9.6 g/dL — ABNORMAL LOW (ref 13.0–17.0)
MCH: 30.2 pg (ref 26.0–34.0)
MCHC: 33.6 g/dL (ref 30.0–36.0)
MCV: 89.9 fL (ref 80.0–100.0)
Platelets: 432 10*3/uL — ABNORMAL HIGH (ref 150–400)
RBC: 3.18 MIL/uL — ABNORMAL LOW (ref 4.22–5.81)
RDW: 14.6 % (ref 11.5–15.5)
WBC: 20.2 10*3/uL — ABNORMAL HIGH (ref 4.0–10.5)
nRBC: 0 % (ref 0.0–0.2)

## 2024-01-08 LAB — MAGNESIUM: Magnesium: 1.6 mg/dL — ABNORMAL LOW (ref 1.7–2.4)

## 2024-01-08 MED ORDER — SENNOSIDES-DOCUSATE SODIUM 8.6-50 MG PO TABS
2.0000 | ORAL_TABLET | Freq: Every day | ORAL | Status: DC
  Administered 2024-01-08 – 2024-01-19 (×12): 2 via ORAL
  Filled 2024-01-08 (×13): qty 2

## 2024-01-08 MED ORDER — POLYETHYLENE GLYCOL 3350 17 G PO PACK
17.0000 g | PACK | Freq: Every day | ORAL | Status: DC
  Administered 2024-01-11 – 2024-01-21 (×8): 17 g via ORAL
  Filled 2024-01-08 (×11): qty 1

## 2024-01-08 MED ORDER — INSULIN GLARGINE-YFGN 100 UNIT/ML ~~LOC~~ SOLN
15.0000 [IU] | Freq: Every day | SUBCUTANEOUS | Status: DC
  Administered 2024-01-09: 15 [IU] via SUBCUTANEOUS
  Filled 2024-01-08: qty 0.15

## 2024-01-08 MED ORDER — MAGNESIUM SULFATE 4 GM/100ML IV SOLN
4.0000 g | Freq: Once | INTRAVENOUS | Status: AC
Start: 2024-01-08 — End: 2024-01-08
  Administered 2024-01-08: 4 g via INTRAVENOUS
  Filled 2024-01-08: qty 100

## 2024-01-08 MED ORDER — POTASSIUM CHLORIDE 10 MEQ/100ML IV SOLN
10.0000 meq | INTRAVENOUS | Status: AC
  Administered 2024-01-08 (×3): 10 meq via INTRAVENOUS
  Filled 2024-01-08 (×3): qty 100

## 2024-01-08 NOTE — Evaluation (Signed)
 Physical Therapy Evaluation Patient Details Name: Brandon [name] MRN: [id number] DOB: 1966-03-08 Today's Date: 01/08/2024  History of Present Illness  58 y.o. male brought to ED 4/15 from his skilled nursing facility due to worsening lower back pain. Brought in from his skilled nursing facility due to worsening lower back pain. 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 PMHx: CVA with residual right-sided hemiplegia, Carotid artery disease s/p L carotid stent, SAH, IPH, On seizure prophylaxis, CAD with PCI/stent RCA, diabetes type 2, hx pancreatitis with pancreatic insufficiency, bilateral renal artery stenosis, hypertension, malnutrition, BPH, GERD, mood disorder.  Clinical Impression  Pt admitted with above diagnosis. Limited historian with some information obtained from notes and prior visits. Claims to be ambulatory with rollator and transferring to wheelchair prior to recent decline. States he was working with PT and OT at SNF but states it was minimal. Required max-total assist for limited bed mobility today. Able to bring LEs to dependent position and elevate HOB with back supported. Declined to try and sit upright without back support, or transfer OOB. Pt able to take few bites of his meal. Trace movement of Lt ankle DF, and able to lift LUE off pillow lightly. Further detailed assessment limited by confusion and easily agitated when moved. Needs to move very slowly, appears to have significant spasticity of LEs when attempting to extend. Patient will benefit from continued inpatient follow up therapy, <3 hours/day. Pt currently with functional limitations due to the deficits listed below (see PT Problem List). Pt will benefit from acute skilled PT to increase their independence and safety with mobility to allow discharge.           If plan is discharge home, recommend the following: Two people to help with walking  and/or transfers;Two people to help with bathing/dressing/bathroom;Assistance with cooking/housework;Supervision due to cognitive status;Assist for transportation;Direct supervision/assist for financial management;Direct supervision/assist for medications management;Assistance with feeding   Can travel by private vehicle   No    Equipment Recommendations None recommended by PT  Recommendations for Other Services       Functional Status Assessment Patient has had a recent decline in their functional status and demonstrates the ability to make significant improvements in function in a reasonable and predictable amount of time.     Precautions / Restrictions Precautions Precautions: Fall Recall of Precautions/Restrictions: Impaired Precaution/Restrictions Comments: Back for comfort Restrictions Weight Bearing Restrictions Per Provider Order: No      Mobility  Bed Mobility Overal bed mobility: Needs Assistance Bed Mobility: Rolling, Sit to Supine, Sidelying to Sit Rolling: Max assist, Used rails Sidelying to sit: HOB elevated, Total assist   Sit to supine: Total assist, HOB elevated   General bed mobility comments: Rolled towards Rt side, cues for rail use, Assisted VERY SLOWLY with LEs out of bed. Elevated HOB, refused further upright or to attempt sit without back support. Assisted to bring LEs back into bed and remained in right sidelying for comfort. HOB still elevated    Transfers                   General transfer comment: declined    Ambulation/Gait                  Stairs            Wheelchair Mobility     Tilt Bed    Modified Rankin (Stroke Patients Only)  Balance                                             Pertinent Vitals/Pain Pain Assessment Pain Assessment: Faces Faces Pain Scale: Hurts whole lot Pain Location: back and LEs Lt>Rt with minimal movement. Appears spastic Pain Descriptors / Indicators:  Grimacing, Guarding, Moaning, Spasm Pain Intervention(s): Monitored during session, Repositioned, Limited activity within patient's tolerance, Utilized relaxation techniques    Home Living Family/patient expects to be discharged to:: Skilled nursing facility                   Additional Comments: Limited history; from SNF - states he works with physical and occupational therapy "If that's what you want to call it."    Prior Function Prior Level of Function : Needs assist             Mobility Comments: questionable historian, ambulates with rollator. Transfers to a wheelchair on his own, cannot propel himself. States he could do this prior to back problems arising.       Extremity/Trunk Assessment   Upper Extremity Assessment Upper Extremity Assessment: Defer to OT evaluation    Lower Extremity Assessment Lower Extremity Assessment: Difficult to assess due to impaired cognition (appears to have severe spasticity when attempting to extend knees.)       Communication   Communication Communication: No apparent difficulties    Cognition Arousal: Alert Behavior During Therapy: Agitated   PT - Cognitive impairments: No family/caregiver present to determine baseline, Initiation, Sequencing, Problem solving, Safety/Judgement                         Following commands: Impaired Following commands impaired: Follows one step commands inconsistently, Follows one step commands with increased time     Cueing Cueing Techniques: Verbal cues, Tactile cues, Gestural cues     General Comments General comments (skin integrity, edema, etc.): Assisted to take a few bites of his meal with HOB upright.    Exercises Other Exercises Other Exercises: Sat with LEs dependent, trunk rotated towards Rt, with back supported by [facility] elevated. Declined to move further beyond this point, becomes agitated. Tolerated over 5 minutes. Able to kick Lt foot, move Lt hand slightly off  pillow. Would not attempt to feed himself with RUE.   Assessment/Plan    PT Assessment Patient needs continued PT services  PT Problem List Decreased range of motion;Decreased strength;Decreased activity tolerance;Decreased balance;Decreased coordination;Decreased mobility;Decreased cognition;Decreased knowledge of use of DME;Decreased safety awareness;Decreased knowledge of precautions;Cardiopulmonary status limiting activity;Impaired tone;Impaired sensation;Pain       PT Treatment Interventions DME instruction;Functional mobility training;Therapeutic activities;Therapeutic exercise;Balance training;Neuromuscular re-education;Cognitive remediation;Patient/family education;Wheelchair mobility training;Manual techniques;Modalities    PT Goals (Current goals can be found in the Care Plan section)  Acute Rehab PT Goals Patient Stated Goal: Go outside to smoke a cigarette PT Goal Formulation: With patient Time For Goal Achievement: 01/22/24 Potential to Achieve Goals: Poor    Frequency Min 1X/week     Co-evaluation               AM-PAC PT "6 Clicks" Mobility  Outcome Measure Help needed turning from your back to your side while in a flat bed without using bedrails?: A Lot Help needed moving from lying on your back to sitting on the side of a flat bed without using bedrails?:  Total Help needed moving to and from a bed to a chair (including a wheelchair)?: Total Help needed standing up from a chair using your arms (e.g., wheelchair or bedside chair)?: Total Help needed to walk in hospital room?: Total Help needed climbing 3-5 steps with a railing? : Total 6 Click Score: 7    End of Session   Activity Tolerance: Other (comment) (Self limited, confusion.) Patient left: in bed;with call bell/phone within reach;with bed alarm set (Pillow between knees) Nurse Communication: Mobility status;Need for lift equipment;Other (comment) (Requested ice water or ginger ale) PT Visit  Diagnosis: Muscle weakness (generalized) (M62.81);Difficulty in walking, not elsewhere classified (R26.2);Other symptoms and signs involving the nervous system (R29.898);Pain Pain - part of body:  (back and BIL LEs)    Time: 2595-6387 PT Time Calculation (min) (ACUTE ONLY): 30 min   Charges:   PT Evaluation $PT Eval Moderate Complexity: 1 Mod PT Treatments $Therapeutic Activity: 8-22 mins PT General Charges $$ ACUTE PT VISIT: 1 Visit         Jory Ng, PT, DPT [facility]  Rehabilitation Services Physical Therapist Office: [phone] Website: [email or web address]   Alinda Irani 01/08/2024, 4:24 PM

## 2024-01-08 NOTE — Progress Notes (Addendum)
 PROGRESS NOTE        PATIENT DETAILS Name: Brandon [name] Age: 58 y.o. Sex: male Date of Birth: 1966/01/03 Admit Date: 01/06/2024 Admitting Physician Dolly Rias, MD ZOX:[id number], Fernand Parkins, FNP  Brief Summary: Patient is a 58 y.o.  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  Significant events: 4/15>> low back pain-emphysematous cystitis/gas in pelvis-admit to [facility]. 4/16>> MRI T-spine/L-spine with epidural abscess from T10-11 to L5-S1.  Significant studies: 4/15>> CT abdomen/pelvis: Extensive emphysematous cystitis-with mild extravesical extension of gas along both pelvic sidewalls, scattered abnormal gas in the spinal canal and from T12-L5 level. 4/16>> MRI C-spine: No evidence of discitis/abscess 4/16>> MRI thoracic/lumbar spine: Epidural abscess spanning T10-11 to L5-S1, disc signal at L4-5 and L5-S1.  Lower cord/cauda equina is diffusely compressed by collection.  Significant microbiology data: 4/15>> blood culture: Strep pneumo/ESBL Klebsiella pneumoniae  Procedures: None  Consults: ID Neurosurgery Cardiology. Urology  Subjective: Lying comfortably in bed-denies any chest pain or shortness of breath.  Agitated very easily when I try to examine him-using profanities-does not want me examining him further.  Very uncooperative.  Objective: Vitals: Blood pressure (!) 162/82, pulse 84, temperature 97.9 F (36.6 C), temperature source Oral, resp. rate 16, SpO2 95%.   Exam: Gen Exam:not in any distress HEENT:atraumatic, normocephalic Chest: B/L clear to  auscultation anteriorly CVS:S1S2 regular Abdomen:soft non tender, non distended Extremities:no edema Neurology: Not performed due to lack of patient cooperation Skin: no rash  Pertinent Labs/Radiology:    Latest Ref Rng & Units 01/08/2024    4:13 AM 01/07/2024    4:57 AM 01/06/2024   10:36 PM  CBC  WBC 4.0 - 10.5 K/uL 20.2  26.0  26.5   Hemoglobin 13.0 - 17.0 g/dL 9.6  9.8  09.8   Hematocrit 39.0 - 52.0 % 28.6  29.3  34.8   Platelets 150 - 400 K/uL 432  436  478     Lab Results  Component Value Date   NA 133 (L) 01/08/2024   K 3.1 (L) 01/08/2024   CL 96 (L) 01/08/2024   CO2 22 01/08/2024      Assessment/Plan: Sepsis secondary to polymicrobial bacteremia with streptococci/Klebsiella pneumonia-along with emphysematous cystitis with T10-11 to L5-S1 epidural abscess with resultant cord compression Sepsis physiology gradually improving Difficult neuro exam-appears to have significant pain/weakness in lower extremities. Evaluated by neurosurgeon-Dr. Mady Haagensen 4/16-does not feel that surgical intervention will improve clinical outcomes-recommends medical treatment (minimally ambulatory to nonambulatory over the past several months per neurosurgery note). Evaluated by urology-apart from antibiotics-possible Foley catheter placement (patient refused) and outpatient cystoscopy-no further inpatient recommendations. Continue meropenem Repeat blood cultures ID continues to follow.  SVT with aberrancy Maintaining sinus rhythm Telemetry monitoring Echo pending TSH with a.m. labs Continue metoprolol  Acute metabolic encephalopathy Secondary to sepsis physiology Improved-but easily agitated this morning.  Answer simple questions appropriately.  Hypokalemia/hypomagnesemia Replete/recheck  Transaminitis Suspect this is sepsis/shock liver Monitor-if worsens-will initiate further workup.  DM-2 (A1c 9.4 on 4/16) with uncontrolled hyperglycemia On insulin infusion on initial  presentation-has been switched to  SQ insulin CBGs on the higher side Increase Semglee to 15 units+ SSI  History of CVA with significant left-sided hemiparesis Difficult exam but seems to have significant paraparesis/pain issues from cord compression due to epidural abscess Per patient he apparently wasusing a quad cane-however per neurosurgery-patient has been nonambulatory for several months.  CAD-s/p PCI in 2016 No anginal symptoms Hold Plavix-per ID note-May require IR to aspirate epidural abscess Continue beta-blocker/statin  History of seizure disorder Keppra  History of chronic pancreatitis Presumably from prior history of EtOH use Continue Creon  BPH Flomax  History of EtOH use Apparently not drinkiNG anymore-SNF resident Will watch for withdrawal symptoms  History of tobacco abuse  Code status:   Code Status: Full Code   DVT Prophylaxis: SCDs Start: 01/06/24 2054   Family Communication: Sister-[name] Justice-[phone] left VM 4/17   Disposition Plan: Status is: Inpatient Remains inpatient appropriate because: Severity of illness   Planned Discharge Destination:Skilled nursing facility   Diet: Diet Order             Diet NPO time specified Except for: Ice Chips, Sips with Meds  Diet effective now                     Antimicrobial agents: Anti-infectives (From admission, onward)    Start     Dose/Rate Route Frequency Ordered Stop   01/07/24 1130  meropenem (MERREM) 1 g in sodium chloride 0.9 % 100 mL IVPB        1 g 200 mL/hr over 30 Minutes Intravenous Every 8 hours 01/07/24 1120     01/06/24 2200  ceFEPIme (MAXIPIME) 2 g in sodium chloride 0.9 % 100 mL IVPB  Status:  Discontinued        2 g 200 mL/hr over 30 Minutes Intravenous Every 8 hours 01/06/24 2001 01/07/24 1120   01/06/24 2100  metroNIDAZOLE (FLAGYL) IVPB 500 mg  Status:  Discontinued        500 mg 100 mL/hr over 60 Minutes Intravenous Every 12 hours 01/06/24 2001 01/07/24 1120    01/06/24 1845  vancomycin (VANCOCIN) IVPB 1000 mg/200 mL premix  Status:  Discontinued        1,000 mg 200 mL/hr over 60 Minutes Intravenous 2 times daily 01/06/24 1839 01/07/24 0953   01/06/24 1830  cefTRIAXone (ROCEPHIN) 1 g in sodium chloride 0.9 % 100 mL IVPB        1 g 200 mL/hr over 30 Minutes Intravenous  Once 01/06/24 1819 01/06/24 1909   01/06/24 1800  ciprofloxacin (CIPRO) IVPB 200 mg  Status:  Discontinued        200 mg 100 mL/hr over 60 Minutes Intravenous  Once 01/06/24 1755 01/06/24 1819        MEDICATIONS: Scheduled Meds:  adenosine (ADENOCARD) IV  3 mg Intravenous Once   busPIRone  10 mg Oral BID   famotidine  20 mg Oral BID   insulin aspart  0-9 Units Subcutaneous TID WC   insulin glargine-yfgn  10 Units Subcutaneous Daily   levETIRAcetam  500 mg Oral BID   lipase/protease/amylase  36,000 Units Oral TID WC   metoprolol tartrate  25 mg Oral Q6H   nicotine  14 mg Transdermal Daily   rosuvastatin  5 mg Oral QHS   sodium chloride flush  3 mL Intravenous Q12H   tamsulosin  0.4 mg Oral QPC supper   traZODone  150 mg Oral QHS   Continuous Infusions:  insulin Stopped (01/07/24 1700)   magnesium sulfate  bolus IVPB 4 g (01/08/24 0847)   meropenem (MERREM) IV Stopped (01/08/24 0530)   potassium chloride 10 mEq (01/08/24 0847)   PRN Meds:.acetaminophen, albuterol, dextrose, HYDROmorphone (DILAUDID) injection, LORazepam, metoprolol tartrate, oxyCODONE **OR** oxyCODONE   I have personally reviewed following labs and imaging studies  LABORATORY DATA: CBC: Recent Labs  Lab 01/06/24 1214 01/06/24 2236 01/07/24 0457 01/08/24 0413  WBC 25.3* 26.5* 26.0* 20.2*  NEUTROABS 23.1* 25.0*  --   --   HGB 11.4* 11.2* 9.8* 9.6*  HCT 35.2* 34.8* 29.3* 28.6*  MCV 92.6 93.8 91.3 89.9  PLT PLATELET CLUMPS NOTED ON SMEAR, UNABLE TO ESTIMATE 478* 436* 432*    Basic Metabolic Panel: Recent Labs  Lab 01/06/24 1214 01/06/24 2236 01/07/24 0457 01/08/24 0413  NA 129* 129*  130* 133*  K 4.1 3.6 3.1* 3.1*  CL 92* 93* 95* 96*  CO2 21* 21* 21* 22  GLUCOSE 307* 353* 241* 228*  BUN 31* 26* 19 14  CREATININE 0.78 0.82 0.55* 0.65  CALCIUM 8.7* 8.6* 8.2* 8.4*  MG  --  1.7 2.2 1.6*  PHOS  --  3.0 2.4*  --     GFR: Estimated Creatinine Clearance: 90.6 mL/min (by C-G formula based on SCr of 0.65 mg/dL).  Liver Function Tests: Recent Labs  Lab 01/06/24 1214 01/08/24 0413  AST 90* 185*  ALT 41 87*  ALKPHOS 191* 205*  BILITOT 1.2 0.9  PROT 7.5 5.6*  ALBUMIN 2.3* 1.5*   Recent Labs  Lab 01/06/24 2130  LIPASE 20   No results for input(s): "AMMONIA" in the last 168 hours.  Coagulation Profile: Recent Labs  Lab 01/07/24 0457 01/08/24 0413  INR 1.5* 1.5*    Cardiac Enzymes: Recent Labs  Lab 01/06/24 2130  CKTOTAL 362    BNP (last 3 results) No results for input(s): "PROBNP" in the last 8760 hours.  Lipid Profile: No results for input(s): "CHOL", "HDL", "LDLCALC", "TRIG", "CHOLHDL", "LDLDIRECT" in the last 72 hours.  Thyroid Function Tests: No results for input(s): "TSH", "T4TOTAL", "FREET4", "T3FREE", "THYROIDAB" in the last 72 hours.  Anemia Panel: No results for input(s): "VITAMINB12", "FOLATE", "FERRITIN", "TIBC", "IRON", "RETICCTPCT" in the last 72 hours.  Urine analysis:    Component Value Date/Time   COLORURINE BLUE (A) 01/06/2024 1615   APPEARANCEUR TURBID (A) 01/06/2024 1615   LABSPEC 1.027 01/06/2024 1615   PHURINE 5.0 01/06/2024 1615   GLUCOSEU 150 (A) 01/06/2024 1615   HGBUR MODERATE (A) 01/06/2024 1615   BILIRUBINUR NEGATIVE 01/06/2024 1615   KETONESUR 5 (A) 01/06/2024 1615   PROTEINUR 100 (A) 01/06/2024 1615   UROBILINOGEN 0.2 02/16/2014 1742   NITRITE POSITIVE (A) 01/06/2024 1615   LEUKOCYTESUR TRACE (A) 01/06/2024 1615    Sepsis Labs: Lactic Acid, Venous    Component Value Date/Time   LATICACIDVEN 1.7 01/06/2024 2130    MICROBIOLOGY: Recent Results (from the past 240 hours)  Blood culture (routine x 2)      Status: Abnormal (Preliminary result)   Collection Time: 01/06/24  1:14 PM   Specimen: BLOOD  Result Value Ref Range Status   Specimen Description   Final    BLOOD SITE NOT SPECIFIED Performed at [facility], [address], [place], [place] [address]    Special Requests   Final    BOTTLES DRAWN AEROBIC AND ANAEROBIC Blood Culture adequate volume Performed at [facility], [address] [place], [place] [address]    Culture  Setup Time   Final    Hillis Lu  NEGATIVE RODS IN BOTH AEROBIC AND ANAEROBIC BOTTLES CRITICAL RESULT CALLED TO, READ BACK BY AND VERIFIED WITH: RN ED AT [facility] [id number] AT 1055 BY CM    Culture (A)  Final    KLEBSIELLA PNEUMONIAE SUSCEPTIBILITIES TO FOLLOW Performed at [facility], [address], [place], [place] [address]    Report Status PENDING  Incomplete  Blood Culture ID Panel (Reflexed)     Status: Abnormal   Collection Time: 01/06/24  1:14 PM  Result Value Ref Range Status   Enterococcus faecalis NOT DETECTED NOT DETECTED Final   Enterococcus Faecium NOT DETECTED NOT DETECTED Final   Listeria monocytogenes NOT DETECTED NOT DETECTED Final   Staphylococcus species NOT DETECTED NOT DETECTED Final   Staphylococcus aureus (BCID) NOT DETECTED NOT DETECTED Final   Staphylococcus epidermidis NOT DETECTED NOT DETECTED Final   Staphylococcus lugdunensis NOT DETECTED NOT DETECTED Final   Streptococcus species NOT DETECTED NOT DETECTED Final   Streptococcus agalactiae NOT DETECTED NOT DETECTED Final   Streptococcus pneumoniae NOT DETECTED NOT DETECTED Final   Streptococcus pyogenes NOT DETECTED NOT DETECTED Final   A.calcoaceticus-baumannii NOT DETECTED NOT DETECTED Final   Bacteroides fragilis NOT DETECTED NOT DETECTED Final   Enterobacterales DETECTED (A) NOT DETECTED Final    Comment: CRITICAL RESULT CALLED TO, READ BACK BY AND VERIFIED WITH: RN ED AT [facility] [id number] AT 1055 BY CM    Enterobacter cloacae  complex NOT DETECTED NOT DETECTED Final   Escherichia coli NOT DETECTED NOT DETECTED Final   Klebsiella aerogenes NOT DETECTED NOT DETECTED Final   Klebsiella oxytoca NOT DETECTED NOT DETECTED Final   Klebsiella pneumoniae DETECTED (A) NOT DETECTED Final    Comment: CRITICAL RESULT CALLED TO, READ BACK BY AND VERIFIED WITH: RN ED AT [facility] [id number] AT 1055 BY CM    Proteus species NOT DETECTED NOT DETECTED Final   Salmonella species NOT DETECTED NOT DETECTED Final   Serratia marcescens NOT DETECTED NOT DETECTED Final   Haemophilus influenzae NOT DETECTED NOT DETECTED Final   Neisseria meningitidis NOT DETECTED NOT DETECTED Final   Pseudomonas aeruginosa NOT DETECTED NOT DETECTED Final   Stenotrophomonas maltophilia NOT DETECTED NOT DETECTED Final   Candida albicans NOT DETECTED NOT DETECTED Final   Candida auris NOT DETECTED NOT DETECTED Final   Candida glabrata NOT DETECTED NOT DETECTED Final   Candida krusei NOT DETECTED NOT DETECTED Final   Candida parapsilosis NOT DETECTED NOT DETECTED Final   Candida tropicalis NOT DETECTED NOT DETECTED Final   Cryptococcus neoformans/gattii NOT DETECTED NOT DETECTED Final   CTX-M ESBL DETECTED (A) NOT DETECTED Final    Comment: CRITICAL RESULT CALLED TO, READ BACK BY AND VERIFIED WITH: RN ED AT [facility] [id number] AT 1055 BY CM (NOTE) Extended spectrum beta-lactamase detected. Recommend a carbapenem as initial therapy.      Carbapenem resistance IMP NOT DETECTED NOT DETECTED Final   Carbapenem resistance KPC NOT DETECTED NOT DETECTED Final   Carbapenem resistance NDM NOT DETECTED NOT DETECTED Final   Carbapenem resist OXA 48 LIKE NOT DETECTED NOT DETECTED Final   Carbapenem resistance VIM NOT DETECTED NOT DETECTED Final    Comment: Performed at [facility], [address] [place], [place] [address]  Urine Culture     Status: Abnormal (Preliminary result)   Collection Time: 01/06/24  4:15 PM   Specimen: Urine, Clean Catch   Result Value Ref Range Status   Specimen Description   Final  URINE, CLEAN CATCH Performed at [facility], [address], [place], [place] [address]    Special Requests   Final    NONE Performed at [facility], [address] [place], [place] [address]    Culture (A)  Final    >=100,000 COLONIES/mL GRAM NEGATIVE RODS CULTURE REINCUBATED FOR BETTER GROWTH SUSCEPTIBILITIES TO FOLLOW Performed at [facility], [address], [place], [place] [address]    Report Status PENDING  Incomplete  Blood culture (routine x 2)     Status: Abnormal (Preliminary result)   Collection Time: 01/06/24  4:16 PM   Specimen: BLOOD LEFT HAND  Result Value Ref Range Status   Specimen Description   Final    BLOOD LEFT HAND Performed at [facility], [address] [place], [place] [address]    Special Requests   Final    BOTTLES DRAWN AEROBIC AND ANAEROBIC Blood Culture adequate volume Performed at [facility], [address] [place], [place] [address]    Culture  Setup Time   Final    GRAM POSITIVE COCCI IN CLUSTERS IN BOTH AEROBIC AND ANAEROBIC BOTTLES PREVIOUSLY REPORTED AS: GRAM NEGATIVE RODS CORRECTED RESULTS CALLED TO: PHARMD C. AMEND [id number] @2012  [facility]    Culture (A)  Final    STREPTOCOCCUS PNEUMONIAE SUSCEPTIBILITIES TO FOLLOW Performed at [facility], [address] [place], [place] [address]    Report Status PENDING  Incomplete  Blood Culture ID Panel (Reflexed)     Status: Abnormal   Collection Time: 01/06/24  4:16 PM  Result Value Ref Range Status   Enterococcus faecalis NOT DETECTED NOT DETECTED Final   Enterococcus Faecium NOT DETECTED NOT DETECTED Final   Listeria monocytogenes NOT DETECTED NOT DETECTED Final   Staphylococcus species NOT DETECTED NOT DETECTED Final   Staphylococcus aureus (BCID) NOT DETECTED NOT DETECTED Final   Staphylococcus epidermidis NOT DETECTED NOT DETECTED Final    Staphylococcus lugdunensis NOT DETECTED NOT DETECTED Final   Streptococcus species DETECTED (A) NOT DETECTED Final    Comment: CRITICAL RESULT CALLED TO, READ BACK BY AND VERIFIED WITH: PHARMD C. AMEND [phone] @2012  [facility]    Streptococcus agalactiae NOT DETECTED NOT DETECTED Final   Streptococcus pneumoniae DETECTED (A) NOT DETECTED Final    Comment: CRITICAL RESULT CALLED TO, READ BACK BY AND VERIFIED WITH: PHARMD C. AMEND [phone] @2012  [facility]    Streptococcus pyogenes NOT DETECTED NOT DETECTED Final   A.calcoaceticus-baumannii NOT DETECTED NOT DETECTED Final   Bacteroides fragilis NOT DETECTED NOT DETECTED Final   Enterobacterales NOT DETECTED NOT DETECTED Final   Enterobacter cloacae complex NOT DETECTED NOT DETECTED Final   Escherichia coli NOT DETECTED NOT DETECTED Final   Klebsiella aerogenes NOT DETECTED NOT DETECTED Final   Klebsiella oxytoca NOT DETECTED NOT DETECTED Final   Klebsiella pneumoniae NOT DETECTED NOT DETECTED Final   Proteus species NOT DETECTED NOT DETECTED Final   Salmonella species NOT DETECTED NOT DETECTED Final   Serratia marcescens NOT DETECTED NOT DETECTED Final   Haemophilus influenzae NOT DETECTED NOT DETECTED Final   Neisseria meningitidis NOT DETECTED NOT DETECTED Final   Pseudomonas aeruginosa NOT DETECTED NOT DETECTED Final   Stenotrophomonas maltophilia NOT DETECTED NOT DETECTED Final   Candida albicans NOT DETECTED NOT DETECTED Final   Candida auris NOT DETECTED NOT DETECTED Final   Candida glabrata NOT DETECTED NOT DETECTED Final   Candida krusei NOT DETECTED NOT DETECTED Final   Candida parapsilosis NOT DETECTED NOT  DETECTED Final   Candida tropicalis NOT DETECTED NOT DETECTED Final   Cryptococcus neoformans/gattii NOT DETECTED NOT DETECTED Final    Comment: Performed at [facility], [address], [place], [place] [address]    RADIOLOGY STUDIES/RESULTS: MR THORACIC SPINE W WO CONTRAST Result Date: 01/07/2024 CLINICAL DATA:  Possible spinal  abscess on CT EXAM: MRI CERVICAL, THORACIC AND LUMBAR SPINE WITHOUT AND WITH CONTRAST TECHNIQUE: Multiplanar and multiecho pulse sequences of the cervical spine, to include the craniocervical junction and cervicothoracic junction, and thoracic and lumbar spine, were obtained without and with intravenous contrast. CONTRAST:  7mL GADAVIST GADOBUTROL 1 MMOL/ML IV SOLN COMPARISON:  Abdominal CT from yesterday.  Thoracic MRI 02/06/2020 FINDINGS: MRI CERVICAL SPINE FINDINGS Alignment: No acute malalignment Vertebrae: Endplate edema at V7-8 and C3-4 is likely degenerative. No evidence of endplate destruction or paravertebral edema. No acute fracture Cord: Not well assessed given the degree of motion artifact, canal exclude cord signal abnormality. Posterior Fossa, vertebral arteries, paraspinal tissues: Absent flow void with collapse in the right ICA. The distal right vertebral flow void is also not seen, both findings known from 2023 brain MRI. Disc levels: C2-3: Facet spurring and disc space narrowing without impingement C3-4: Disc collapse with endplate and uncovertebral ridging. There is a broad central protrusion. Spinal stenosis with cord flattening, midline canal diameter of 5 mm. Bilateral foraminal narrowing is mild by postcontrast axial images. C4-5: Disc collapse and endplate degeneration with circumferential disc bulging and ridging. Spinal stenosis with cord flattening. Advanced right and moderate left foraminal narrowing by postcontrast axial images. C5-6: Disc narrowing and bulging with uncovertebral spurring. Degenerative facet spurring greater on the right. Mild spinal stenosis. Moderate bilateral foraminal narrowing by axial postcontrast images. C6-7: Disc space narrowing and mild spurring. No evidence of impingement C7-T1:Mild facet spurring and disc bulging. No evidence of impingement MRI THORACIC SPINE FINDINGS Alignment:  No acute malalignment.  Generalized thoracic kyphosis. Vertebrae: T9-10 and  T10-11 intervertebral ankylosis, site of prior discitis. Remote compression fracture of T12. No acute fracture or endplate destruction Cord: Normal cord signal. Posterior and right eccentric epidural collection with rim enhancement starting it T10-11 with epidural thickening starting at T7-8. The cord is displaced leftward and ventrally at T10 and below without detected cord edema. Paraspinal and other soft tissues: No paraspinal phlegmon or mass seen. Disc levels: Generalized disc desiccation and narrowing. Mild multilevel facet spurring. Small disc herniations at T4-5 and T8-9, insignificant. MRI LUMBAR SPINE FINDINGS Segmentation:  Standard. Alignment:  Mild L4-5 and L5-S1 retrolisthesis Vertebrae: T2 hyperintensity with gas phenomenon in the L4-5 and L5-S1 disc spaces, favor degenerative in the absence of significant endplate edema or endplate destruction. No acute fracture. Remote L1 compression fracture. Mild degenerative marrow edema at the L4-5 facets. Conus medullaris and cauda equina: Posterior epidural collection beginning of thoracic spine becomes more ventral predominant at L3 and below, ventral collection measuring up to 7 mm in thickness on sagittal images with bubbles of gas. Although there is still continuing epidural thickening and hyperenhancement, no collection seen below L5-S1 Paraspinal and other soft tissues: No perispinal abscess detected. See prior abdominal CT Disc levels: Generalized degenerative disc narrowing and bulging with degenerative facet spurring at L2-3 and below. Degeneration contributes to bilateral foraminal stenosis at L3-4 and below spinal stenosis especially at L4-5. IMPRESSION: Cervical spine: No acute finding.  No evidence of discitis or abscess. Very motion degraded MRI still positive for moderate degenerative spinal stenosis at C3-4 and C4-5. Foraminal stenoses at C3-4 to  C5-6. Absent right carotid and vertebral flow voids, chronic when compared to 2023. Thoracic and  lumbar spine: Epidural abscess spanning T10-11 to L5-S1, primarily posterior canal in the upper extent and in the anterior canal at the lower extent. At the level of L3, ventral abscess measures up to 7 mm in thickness and contains gas. There is some disc edematous signal at L4-5 and L5-S1, but no clear discitis, suspect direct paravertebral seeding from UTI based on prior abdominal CT. The lower cord and cauda equina is diffusely compressed by the collection. Electronically Signed   By: Tiburcio Pea M.D.   On: 01/07/2024 05:08   MR Lumbar Spine W Wo Contrast Result Date: 01/07/2024 CLINICAL DATA:  Possible spinal abscess on CT EXAM: MRI CERVICAL, THORACIC AND LUMBAR SPINE WITHOUT AND WITH CONTRAST TECHNIQUE: Multiplanar and multiecho pulse sequences of the cervical spine, to include the craniocervical junction and cervicothoracic junction, and thoracic and lumbar spine, were obtained without and with intravenous contrast. CONTRAST:  7mL GADAVIST GADOBUTROL 1 MMOL/ML IV SOLN COMPARISON:  Abdominal CT from yesterday.  Thoracic MRI 02/06/2020 FINDINGS: MRI CERVICAL SPINE FINDINGS Alignment: No acute malalignment Vertebrae: Endplate edema at W0-9 and C3-4 is likely degenerative. No evidence of endplate destruction or paravertebral edema. No acute fracture Cord: Not well assessed given the degree of motion artifact, canal exclude cord signal abnormality. Posterior Fossa, vertebral arteries, paraspinal tissues: Absent flow void with collapse in the right ICA. The distal right vertebral flow void is also not seen, both findings known from 2023 brain MRI. Disc levels: C2-3: Facet spurring and disc space narrowing without impingement C3-4: Disc collapse with endplate and uncovertebral ridging. There is a broad central protrusion. Spinal stenosis with cord flattening, midline canal diameter of 5 mm. Bilateral foraminal narrowing is mild by postcontrast axial images. C4-5: Disc collapse and endplate degeneration with  circumferential disc bulging and ridging. Spinal stenosis with cord flattening. Advanced right and moderate left foraminal narrowing by postcontrast axial images. C5-6: Disc narrowing and bulging with uncovertebral spurring. Degenerative facet spurring greater on the right. Mild spinal stenosis. Moderate bilateral foraminal narrowing by axial postcontrast images. C6-7: Disc space narrowing and mild spurring. No evidence of impingement C7-T1:Mild facet spurring and disc bulging. No evidence of impingement MRI THORACIC SPINE FINDINGS Alignment:  No acute malalignment.  Generalized thoracic kyphosis. Vertebrae: T9-10 and T10-11 intervertebral ankylosis, site of prior discitis. Remote compression fracture of T12. No acute fracture or endplate destruction Cord: Normal cord signal. Posterior and right eccentric epidural collection with rim enhancement starting it T10-11 with epidural thickening starting at T7-8. The cord is displaced leftward and ventrally at T10 and below without detected cord edema. Paraspinal and other soft tissues: No paraspinal phlegmon or mass seen. Disc levels: Generalized disc desiccation and narrowing. Mild multilevel facet spurring. Small disc herniations at T4-5 and T8-9, insignificant. MRI LUMBAR SPINE FINDINGS Segmentation:  Standard. Alignment:  Mild L4-5 and L5-S1 retrolisthesis Vertebrae: T2 hyperintensity with gas phenomenon in the L4-5 and L5-S1 disc spaces, favor degenerative in the absence of significant endplate edema or endplate destruction. No acute fracture. Remote L1 compression fracture. Mild degenerative marrow edema at the L4-5 facets. Conus medullaris and cauda equina: Posterior epidural collection beginning of thoracic spine becomes more ventral predominant at L3 and below, ventral collection measuring up to 7 mm in thickness on sagittal images with bubbles of gas. Although there is still continuing epidural thickening and hyperenhancement, no collection seen below L5-S1  Paraspinal and other soft tissues: No perispinal abscess detected.  See prior abdominal CT Disc levels: Generalized degenerative disc narrowing and bulging with degenerative facet spurring at L2-3 and below. Degeneration contributes to bilateral foraminal stenosis at L3-4 and below spinal stenosis especially at L4-5. IMPRESSION: Cervical spine: No acute finding.  No evidence of discitis or abscess. Very motion degraded MRI still positive for moderate degenerative spinal stenosis at C3-4 and C4-5. Foraminal stenoses at C3-4 to C5-6. Absent right carotid and vertebral flow voids, chronic when compared to 2023. Thoracic and lumbar spine: Epidural abscess spanning T10-11 to L5-S1, primarily posterior canal in the upper extent and in the anterior canal at the lower extent. At the level of L3, ventral abscess measures up to 7 mm in thickness and contains gas. There is some disc edematous signal at L4-5 and L5-S1, but no clear discitis, suspect direct paravertebral seeding from UTI based on prior abdominal CT. The lower cord and cauda equina is diffusely compressed by the collection. Electronically Signed   By: Ronnette Coke M.D.   On: 01/07/2024 05:08   MR CERVICAL SPINE W WO CONTRAST Result Date: 01/07/2024 CLINICAL DATA:  Possible spinal abscess on CT EXAM: MRI CERVICAL, THORACIC AND LUMBAR SPINE WITHOUT AND WITH CONTRAST TECHNIQUE: Multiplanar and multiecho pulse sequences of the cervical spine, to include the craniocervical junction and cervicothoracic junction, and thoracic and lumbar spine, were obtained without and with intravenous contrast. CONTRAST:  7mL GADAVIST GADOBUTROL 1 MMOL/ML IV SOLN COMPARISON:  Abdominal CT from yesterday.  Thoracic MRI 02/06/2020 FINDINGS: MRI CERVICAL SPINE FINDINGS Alignment: No acute malalignment Vertebrae: Endplate edema at U7-2 and C3-4 is likely degenerative. No evidence of endplate destruction or paravertebral edema. No acute fracture Cord: Not well assessed given the degree  of motion artifact, canal exclude cord signal abnormality. Posterior Fossa, vertebral arteries, paraspinal tissues: Absent flow void with collapse in the right ICA. The distal right vertebral flow void is also not seen, both findings known from 2023 brain MRI. Disc levels: C2-3: Facet spurring and disc space narrowing without impingement C3-4: Disc collapse with endplate and uncovertebral ridging. There is a broad central protrusion. Spinal stenosis with cord flattening, midline canal diameter of 5 mm. Bilateral foraminal narrowing is mild by postcontrast axial images. C4-5: Disc collapse and endplate degeneration with circumferential disc bulging and ridging. Spinal stenosis with cord flattening. Advanced right and moderate left foraminal narrowing by postcontrast axial images. C5-6: Disc narrowing and bulging with uncovertebral spurring. Degenerative facet spurring greater on the right. Mild spinal stenosis. Moderate bilateral foraminal narrowing by axial postcontrast images. C6-7: Disc space narrowing and mild spurring. No evidence of impingement C7-T1:Mild facet spurring and disc bulging. No evidence of impingement MRI THORACIC SPINE FINDINGS Alignment:  No acute malalignment.  Generalized thoracic kyphosis. Vertebrae: T9-10 and T10-11 intervertebral ankylosis, site of prior discitis. Remote compression fracture of T12. No acute fracture or endplate destruction Cord: Normal cord signal. Posterior and right eccentric epidural collection with rim enhancement starting it T10-11 with epidural thickening starting at T7-8. The cord is displaced leftward and ventrally at T10 and below without detected cord edema. Paraspinal and other soft tissues: No paraspinal phlegmon or mass seen. Disc levels: Generalized disc desiccation and narrowing. Mild multilevel facet spurring. Small disc herniations at T4-5 and T8-9, insignificant. MRI LUMBAR SPINE FINDINGS Segmentation:  Standard. Alignment:  Mild L4-5 and L5-S1  retrolisthesis Vertebrae: T2 hyperintensity with gas phenomenon in the L4-5 and L5-S1 disc spaces, favor degenerative in the absence of significant endplate edema or endplate destruction. No acute fracture. Remote L1 compression fracture.  Mild degenerative marrow edema at the L4-5 facets. Conus medullaris and cauda equina: Posterior epidural collection beginning of thoracic spine becomes more ventral predominant at L3 and below, ventral collection measuring up to 7 mm in thickness on sagittal images with bubbles of gas. Although there is still continuing epidural thickening and hyperenhancement, no collection seen below L5-S1 Paraspinal and other soft tissues: No perispinal abscess detected. See prior abdominal CT Disc levels: Generalized degenerative disc narrowing and bulging with degenerative facet spurring at L2-3 and below. Degeneration contributes to bilateral foraminal stenosis at L3-4 and below spinal stenosis especially at L4-5. IMPRESSION: Cervical spine: No acute finding.  No evidence of discitis or abscess. Very motion degraded MRI still positive for moderate degenerative spinal stenosis at C3-4 and C4-5. Foraminal stenoses at C3-4 to C5-6. Absent right carotid and vertebral flow voids, chronic when compared to 2023. Thoracic and lumbar spine: Epidural abscess spanning T10-11 to L5-S1, primarily posterior canal in the upper extent and in the anterior canal at the lower extent. At the level of L3, ventral abscess measures up to 7 mm in thickness and contains gas. There is some disc edematous signal at L4-5 and L5-S1, but no clear discitis, suspect direct paravertebral seeding from UTI based on prior abdominal CT. The lower cord and cauda equina is diffusely compressed by the collection. Electronically Signed   By: Tiburcio Pea M.D.   On: 01/07/2024 05:08   CT ABDOMEN PELVIS W CONTRAST Result Date: 01/06/2024 CLINICAL DATA:  Urinary tract infection, recent change in antibiotics. Back pain/flank pain.  EXAM: CT ABDOMEN AND PELVIS WITH CONTRAST TECHNIQUE: Multidetector CT imaging of the abdomen and pelvis was performed using the standard protocol following bolus administration of intravenous contrast. RADIATION DOSE REDUCTION: This exam was performed according to the departmental dose-optimization program which includes automated exposure control, adjustment of the mA and/or kV according to patient size and/or use of iterative reconstruction technique. CONTRAST:  OMNIPAQUE IOHEXOL 300 MG/ML  SOLN COMPARISON:  Lumbar radiographs 12/31/2023 and CT abdomen 02/06/2020 FINDINGS: The patient is contracted and rotated on today's exam with some resulting reduced diagnostic accuracy. Lower chest: Coronary and descending thoracic aortic atherosclerosis. Distal esophageal wall thickening is nonspecific although esophagitis would be a common cause. Airway plugging in the right lower lobe with bandlike irregular right lower lobe opacity likely reflecting mild localized pneumonia. Lesser degree of airway plugging in the left lower lobe. Trace left pleural thickening posteriorly on image 16 series 2, nonspecific. Hepatobiliary: Intrahepatic biliary dilatation similar to previous without definite substantial extrahepatic biliary dilatation. Cavernous transformation of the portal vein. No definite focal liver mass identified. Pancreas: Heterogeneous density in the pancreas diffusely, pancreatitis is not excluded. Scattered calcifications likely reflect a chronic component of pancreatitis. Correlate with lipase levels. No obvious abscess or pseudocyst. Spleen: Unremarkable Adrenals/Urinary Tract: Fullness of the adrenal glands without discrete mass. Relative left renal atrophy. Indistinctly marginated parenchymal hypodensity in the right mid kidney on image 101 series 8 and also shown on image 32 series 2, suspicious for pyelonephritis. No hydronephrosis or hydroureter. There is extensive emphysematous cystitis in the urinary  bladder along with gas in the urinary bladder lumen mild extravesical extension of gas along both pelvic sidewalls and along the right space of Retzius. No well-defined drainable abscess. No free air in the peritoneal cavity identified. Stomach/Bowel: Prominent stool throughout the colon favors constipation. Normal appendix. No imaging findings of pseudomembranous colitis. 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 Calcified and  noncalcified atheromatous plaque in the superior mesenteric artery with severe SMA stenosis for example on image 107 series 10. No out right complete occlusion. Celiac trunk appears patent. Aortic and proximal renal artery atheromatous vascular calcifications. Reproductive: Unremarkable Other: No supplemental non-categorized findings. Musculoskeletal: Prior interbody fusion at T9-T10-T11. Scattered abnormal gas in the spinal canal extending from the T12 level through the L5 level, some peripheral and some central in distribution in the canal. Cannot exclude intradural gas or regional spinal canal abscess, although I do not see new definite bony destructive findings along the endplates to further indicate recurrent discitis. Lumbar MRI with and without contrast is recommended for further characterization. IMPRESSION: 1. 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 No well-defined drainable pelvic abscess. 2. Indistinctly marginated parenchymal hypodensity in the right mid kidney, suspicious for pyelonephritis. 3. Scattered abnormal gas in the spinal canal extending from the T12 level through the L5 level, some peripheral and some central in distribution in the canal. Cannot exclude intradural gas or regional spinal canal abscess, although I do not see new  definite bony destructive findings along the endplates to further indicate recurrent discitis. Lumbar MRI with and without contrast is recommended for further characterization, specifically to assess for spinal infection. 4. Heterogeneous density in the pancreas diffusely, pancreatitis is not excluded. Scattered calcifications likely reflect a chronic component of pancreatitis. Correlate with lipase levels. 5. Airway plugging in the right lower lobe with bandlike irregular right lower lobe opacity likely reflecting mild localized pneumonia. Lesser degree of airway plugging in the left lower lobe. 6. 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 7. Prominent stool throughout the colon favors constipation. 8. Coronary and descending thoracic aortic atherosclerosis. Aortic Atherosclerosis (ICD10-I70.0). 9. Distal esophageal wall thickening is nonspecific although esophagitis would be a common cause. Electronically Signed   By: Freida Jes M.D.   On: 01/06/2024 17:04     LOS: 2 days   Kimberly Penna, MD  [facility]    To contact the attending provider between 7A-7P or the covering provider during after hours 7P-7A, please log into the web site [email or web address] and access using universal [place] password for that web site. If you do not have the password, please call the hospital operator.  01/08/2024, 9:28 AM

## 2024-01-08 NOTE — Progress Notes (Signed)
 [facility]  Date of Admission:  01/06/2024   Total days of antibiotics  Ceftriaxone 4/15 - 4/16 Vancomycin 4/15 - 4/16 Flagyl  4/15- 4/16 Cefepime 4/16  Meropenem 4/16 -     ASSESSMENT:  - Epidural abscess Imaging shows presence from T10-11 to L5-S1. Neurosurgery was consulted and is recommending conservative measures at this time. Ideally we'd like to culture the abscess to determine if current treatment plan is appropriate but unsure if IR is able to complete at this time. It does appear based on imaging and assessment that the abscess may be causing cord compression and worsening lower extremity weakness. Continue to monitor neurologic function that would warrant reconsideration for surgical intervention. Orders placed for sed rate and CRP to monitor inflammation. Monitor CBC & CMP; adjust dosage as needed to renal function changes. Likely going to require prolonged IV therapy.   - Polymicrobial bacteremia with sepsis  Blood cultures from ED show ESBL Klebsiella pneumoniae and Strep pneumoniae. Unsure which of the two is the main culprit of his bacteremia so continue broad spectrum therapy with Meropenem Q8H. Antibiotic susceptibilities are pending; plan to adjust treatment if needed. Repeat blood cultures were drawn this morning - monitor to ensure bacteremia is resolving. ECHO was completed today - results pending.   PLAN:  1.  Continue Meropenem Q8H   2. Orders placed for sed rate and CRP to be drawn in AM.   3. Blood cultures collected today - continue to monitor      Principal Problem:   Epidural abscess Active Problems:   Wide-complex tachycardia   Diskitis   Uncontrolled type 2 diabetes mellitus with hyperglycemia, with long-term current use of insulin (HCC)   Emphysematous cystitis   Abnormal findings on diagnostic imaging of spine   Acute pyelonephritis   Paraplegia (HCC)   Sepsis (HCC)   Spinal abscess (HCC)   SVT (supraventricular  tachycardia) (HCC)   Infection due to ESBL-producing Klebsiella pneumoniae   Gram-negative bacteremia   Hyperglycemia   busPIRone 10 mgOralBID famotidine 20 mgOralBID insulin aspart 0-9 UnitsSubcutaneousTID WC [START ON 01/09/2024] insulin glargine-yfgn 15 UnitsSubcutaneousDaily levETIRAcetam 500 mgOralBID lipase/protease/amylase 36,000 UnitsOralTID WC metoprolol tartrate 25 mgOralQ6H nicotine 14 mgTransdermalDaily polyethylene glycol 17 gOralDaily rosuvastatin 5 mgOralQHS senna-docusate 2 tabletOralQHS sodium chloride flush 3 mLIntravenousQ12H tamsulosin 0.4 mgOralQPC supper traZODone 150 mgOralQHS   SUBJECTIVE    Review of Systems: Review of Systems  Reason unable to perform ROS: Unable to obtain full ROS due to AMS.  Gastrointestinal:  Positive for abdominal pain.  Musculoskeletal:  Positive for neck pain.     No Known Allergies  OBJECTIVE: Vitals: 01/08/24 040004/17/25 050204/17/25 074804/17/25 1142 BP:128/79133/82(!) 162/82 Pulse:[id number] Resp:1716 Temp:97.9 F (36.6 C)98.4 F (36.9 C) TempSrc:OralAxillary SpO2:94%95%  There is no height or weight on file to calculate BMI.   Physical Exam Constitutional:      Appearance: He is ill-appearing.  HENT:     Nose: Nose normal.  Cardiovascular:     Pulses: Normal pulses.  Pulmonary:     Effort: Pulmonary effort is normal.  Abdominal:     Tenderness: There is abdominal tenderness.  Skin:    General: Skin is warm.  Neurological:     Mental Status: He is disoriented.     Motor: Weakness present.  Psychiatric:     Comments: Able to identify where he is experiencing pain      Lab Results Lab Results ComponentValueDate WBC20.2 (H)01/08/2024 HGB9.6 (L)01/08/2024 HCT28.6 (L)01/08/2024 MCV89.904/17/2025 HYQ657 (H)01/08/2024   Lab Results ComponentValueDate  CREATININE0.6504/17/2025 BUN1404/17/2025 KV425 (L)01/08/2024 K3.1 (L)01/08/2024 CL96 (L)01/08/2024 CO22204/17/2025   Lab  Results ComponentValueDate ALT87 (H)01/08/2024 ZDG387 (H)01/08/2024 FIEPPIR518 (H)01/08/2024 BILITOT0.904/17/2025    Microbiology: Recent Results (from the past 240 hours) Blood culture (routine x 2)     Status: Abnormal (Preliminary result) Collection Time: 01/06/24  1:14 PM Specimen: BLOOD ResultValueRef RangeStatus Specimen DescriptionFinal BLOOD SITE NOT SPECIFIED Performed at [facility], [address], [place], [place] [address]  Special RequestsFinal BOTTLES DRAWN AEROBIC AND ANAEROBIC Blood Culture adequate volume Performed at [facility], [address] [place], [place] [address]  Culture  Setup TimeFinal GRAM NEGATIVE RODS IN BOTH AEROBIC AND ANAEROBIC BOTTLES CRITICAL RESULT CALLED TO, READ BACK BY AND VERIFIED WITH: RN ED AT [facility] [id number] AT 1055 BY CM  Culture(A)Final KLEBSIELLA PNEUMONIAE SUSCEPTIBILITIES TO FOLLOW Performed at [facility], [address], [place], [place] [address]  Report StatusPENDINGIncomplete Blood Culture ID Panel (Reflexed)     Status: Abnormal Collection Time: 01/06/24  1:14 PM ResultValueRef RangeStatus Enterococcus faecalisNOT DETECTEDNOT DETECTEDFinal Enterococcus FaeciumNOT DETECTEDNOT DETECTEDFinal Listeria monocytogenesNOT DETECTEDNOT DETECTEDFinal Staphylococcus speciesNOT DETECTEDNOT DETECTEDFinal Staphylococcus aureus (BCID)NOT DETECTEDNOT DETECTEDFinal Staphylococcus epidermidisNOT DETECTEDNOT DETECTEDFinal Staphylococcus lugdunensisNOT DETECTEDNOT DETECTEDFinal Streptococcus speciesNOT DETECTEDNOT DETECTEDFinal Streptococcus agalactiaeNOT DETECTEDNOT DETECTEDFinal Streptococcus pneumoniaeNOT DETECTEDNOT DETECTEDFinal Streptococcus pyogenesNOT DETECTEDNOT DETECTEDFinal A.calcoaceticus-baumanniiNOT DETECTEDNOT DETECTEDFinal Bacteroides fragilisNOT DETECTEDNOT DETECTEDFinal EnterobacteralesDETECTED (A)NOT DETECTEDFinal Comment:CRITICAL RESULT CALLED TO, READ BACK BY  AND VERIFIED WITH: RN ED AT [facility] [id number] AT 1055 BY CM  Enterobacter cloacae complexNOT DETECTEDNOT DETECTEDFinal Escherichia coliNOT DETECTEDNOT DETECTEDFinal Klebsiella aerogenesNOT DETECTEDNOT DETECTEDFinal Klebsiella oxytocaNOT DETECTEDNOT DETECTEDFinal Klebsiella pneumoniaeDETECTED (A)NOT DETECTEDFinal Comment:CRITICAL RESULT CALLED TO, READ BACK BY AND VERIFIED WITH: RN ED AT [facility] [id number] AT 1055 BY CM  Proteus speciesNOT DETECTEDNOT DETECTEDFinal Salmonella speciesNOT DETECTEDNOT DETECTEDFinal Serratia marcescensNOT DETECTEDNOT DETECTEDFinal Haemophilus influenzaeNOT DETECTEDNOT DETECTEDFinal Neisseria meningitidisNOT DETECTEDNOT DETECTEDFinal Pseudomonas aeruginosaNOT DETECTEDNOT DETECTEDFinal Stenotrophomonas maltophiliaNOT DETECTEDNOT DETECTEDFinal Candida albicansNOT DETECTEDNOT DETECTEDFinal Candida aurisNOT DETECTEDNOT DETECTEDFinal Candida glabrataNOT DETECTEDNOT DETECTEDFinal Candida kruseiNOT DETECTEDNOT DETECTEDFinal Candida parapsilosisNOT DETECTEDNOT DETECTEDFinal Candida tropicalisNOT DETECTEDNOT DETECTEDFinal Cryptococcus neoformans/gattiiNOT DETECTEDNOT DETECTEDFinal CTX-M ESBLDETECTED (A)NOT DETECTEDFinal Comment:CRITICAL RESULT CALLED TO, READ BACK BY AND VERIFIED WITH: RN ED AT [facility] [id number] AT 1055 BY CM (NOTE) Extended spectrum beta-lactamase detected. Recommend a carbapenem as initial therapy.    Carbapenem resistance IMPNOT DETECTEDNOT DETECTEDFinal Carbapenem resistance KPCNOT DETECTEDNOT DETECTEDFinal Carbapenem resistance NDMNOT DETECTEDNOT DETECTEDFinal Carbapenem resist OXA 48 LIKENOT DETECTEDNOT DETECTEDFinal Carbapenem resistance VIMNOT DETECTEDNOT DETECTEDFinal Comment:Performed at [facility], [address] [place], [place] [address] Urine Culture     Status: Abnormal (Preliminary result) Collection Time: 01/06/24  4:15 PM Specimen: Urine, Clean Catch ResultValueRef RangeStatus Specimen  DescriptionFinal URINE, CLEAN CATCH Performed at [facility], [address], [place], [place] [address]  Special RequestsFinal NONE Performed at [facility], [address] [place], [place] [address]  Culture(A)Final >=100,000 COLONIES/mL KLEBSIELLA PNEUMONIAE >=100,000 COLONIES/mL ESCHERICHIA COLI SUSCEPTIBILITIES TO FOLLOW Performed at [facility], [address], [place], [place] [address]  Report StatusPENDINGIncomplete Blood culture (routine x 2)     Status: Abnormal (Preliminary result) Collection Time: 01/06/24  4:16 PM Specimen: BLOOD LEFT HAND ResultValueRef RangeStatus Specimen DescriptionFinal BLOOD LEFT HAND Performed at [facility], [address], [place], [place] [address]  Special RequestsFinal BOTTLES DRAWN AEROBIC AND ANAEROBIC Blood Culture adequate volume Performed at [facility], [address], [place], [place] [address]  Culture  Setup TimeFinal GRAM POSITIVE COCCI IN CLUSTERS IN BOTH  AEROBIC AND ANAEROBIC BOTTLES PREVIOUSLY REPORTED AS: GRAM NEGATIVE RODS CORRECTED RESULTS CALLED TO: PHARMD C. AMEND [id number] @2012  FH  Culture(A)Final STREPTOCOCCUS PNEUMONIAE SUSCEPTIBILITIES TO FOLLOW Performed at [facility], [address], [place], [place] [address]  Report StatusPENDINGIncomplete Blood Culture ID Panel (Reflexed)     Status: Abnormal Collection Time: 01/06/24  4:16 PM ResultValueRef RangeStatus Enterococcus faecalisNOT DETECTEDNOT DETECTEDFinal Enterococcus FaeciumNOT DETECTEDNOT DETECTEDFinal Listeria monocytogenesNOT DETECTEDNOT DETECTEDFinal Staphylococcus speciesNOT DETECTEDNOT DETECTEDFinal Staphylococcus aureus (BCID)NOT DETECTEDNOT DETECTEDFinal Staphylococcus epidermidisNOT DETECTEDNOT DETECTEDFinal Staphylococcus lugdunensisNOT DETECTEDNOT DETECTEDFinal Streptococcus speciesDETECTED (A)NOT DETECTEDFinal Comment:CRITICAL RESULT CALLED TO, READ BACK BY  AND VERIFIED WITH: PHARMD C. AMEND [id number] @2012  [facility]  Streptococcus agalactiaeNOT DETECTEDNOT DETECTEDFinal Streptococcus pneumoniaeDETECTED (A)NOT DETECTEDFinal Comment:CRITICAL RESULT CALLED TO, READ BACK BY AND VERIFIED WITH: PHARMD C. AMEND [id number] @2012  [facility]  Streptococcus pyogenesNOT DETECTEDNOT DETECTEDFinal A.calcoaceticus-baumanniiNOT DETECTEDNOT DETECTEDFinal Bacteroides fragilisNOT DETECTEDNOT DETECTEDFinal EnterobacteralesNOT DETECTEDNOT DETECTEDFinal Enterobacter cloacae complexNOT DETECTEDNOT DETECTEDFinal Escherichia coliNOT DETECTEDNOT DETECTEDFinal Klebsiella aerogenesNOT DETECTEDNOT DETECTEDFinal Klebsiella oxytocaNOT DETECTEDNOT DETECTEDFinal Klebsiella pneumoniaeNOT DETECTEDNOT DETECTEDFinal Proteus speciesNOT DETECTEDNOT DETECTEDFinal Salmonella speciesNOT DETECTEDNOT DETECTEDFinal Serratia marcescensNOT DETECTEDNOT DETECTEDFinal Haemophilus influenzaeNOT DETECTEDNOT DETECTEDFinal Neisseria meningitidisNOT DETECTEDNOT DETECTEDFinal Pseudomonas aeruginosaNOT DETECTEDNOT DETECTEDFinal Stenotrophomonas maltophiliaNOT DETECTEDNOT DETECTEDFinal Candida albicansNOT DETECTEDNOT DETECTEDFinal Candida aurisNOT DETECTEDNOT DETECTEDFinal Candida glabrataNOT DETECTEDNOT DETECTEDFinal Candida kruseiNOT DETECTEDNOT DETECTEDFinal Candida parapsilosisNOT DETECTEDNOT DETECTEDFinal Candida tropicalisNOT DETECTEDNOT DETECTEDFinal Cryptococcus neoformans/gattiiNOT DETECTEDNOT DETECTEDFinal Comment:Performed at [facility], [address], [place], [place] [address]

## 2024-01-08 NOTE — TOC Initial Note (Signed)
 Transition of Care [facility]) - Initial/Assessment Note    Patient Details  Name: Brandon [name] MRN: [id number] Date of Birth: October 23, 1965  Transition of Care [facility]) CM/SW Contact:    Mearl Latin, LCSW Phone Number: 01/08/2024, 11:14 AM  Clinical Narrative:                 Per [facility], patient has not completed a bed hold so will just need to confirm bed availability with them once patient is medically ready for discharge.   Expected Discharge Plan: Skilled Nursing Facility Barriers to Discharge: Continued Medical Work up   Patient Goals and CMS Choice Patient states their goals for this hospitalization and ongoing recovery are:: Return to SNF          Expected Discharge Plan and Services In-house Referral: Clinical Social Work   Post Acute Care Choice: Skilled Nursing Facility Living arrangements for the past 2 months: Skilled Nursing Facility                                      Prior Living Arrangements/Services Living arrangements for the past 2 months: Skilled Nursing Facility Lives with:: Facility Resident Patient language and need for interpreter reviewed:: Yes Do you feel safe going back to the place where you live?: Yes      Need for Family Participation in Patient Care: Yes (Comment) Care giver support system in place?: Yes (comment)   Criminal Activity/Legal Involvement Pertinent to Current Situation/Hospitalization: No - Comment as needed  Activities of Daily Living   ADL Screening (condition at time of admission) Independently performs ADLs?: No Does the patient have a NEW difficulty with bathing/dressing/toileting/self-feeding that is expected to last >3 days?: No Does the patient have a NEW difficulty with getting in/out of bed, walking, or climbing stairs that is expected to last >3 days?: No Does the patient have a NEW difficulty with communication that is expected to last >3 days?: No Is the patient deaf or have difficulty hearing?:  No Does the patient have difficulty seeing, even when wearing glasses/contacts?: No  Permission Sought/Granted Permission sought to share information with : Facility Industrial/product designer granted to share information with : No     Permission granted to share info w AGENCY: SNF        Emotional Assessment   Attitude/Demeanor/Rapport: Unable to Assess Affect (typically observed): Unable to Assess Orientation: : Oriented to Self Alcohol / Substance Use: Not Applicable Psych Involvement: No (comment)  Admission diagnosis:  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] SVT (supraventricular tachycardia) (HCC) [I47.10] Pyelonephritis [N12] Hyperglycemia [R73.9] 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] Pneumaturia [R39.89] Flank pain [R10.9] Sepsis, due to unspecified organism, unspecified whether acute organ dysfunction present [facility]) [A41.9] Patient Active Problem List   Diagnosis Date Noted   Sepsis (HCC) 01/07/2024   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   SVT (supraventricular tachycardia) (HCC) 01/07/2024   Infection due to ESBL-producing Klebsiella pneumoniae 01/07/2024   Gram-negative bacteremia 01/07/2024   Hyperglycemia 01/07/2024   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   Abnormal findings on diagnostic imaging of spine 01/06/2024   Acute pyelonephritis 01/06/2024   Paraplegia (HCC) 01/06/2024   Fall 08/01/2023   ICH (intracerebral hemorrhage) (HCC) 07/30/2023   Pain in left shoulder 03/25/2022   Pressure injury of skin 01/08/2022   Frequent falls 01/08/2022   DKA (diabetic ketoacidosis) (HCC) 01/07/2022   Rhabdomyolysis 01/07/2022   Protein-calorie malnutrition, severe 12/23/2020   Hyperosmolar hyperglycemic state (HHS) (  HCC) 12/22/2020   Uncontrolled type 2 diabetes mellitus with hyperglycemia, with long-term current use of insulin (HCC) 12/21/2020   Hyperglycemia due to diabetes mellitus (HCC) 12/21/2020   Empyema lung (HCC)    Epidural abscess    Diskitis 02/06/2020   Alcohol  withdrawal delirium (HCC) 02/06/2020   Epilepsy (HCC) 02/06/2020   Pleural effusion on right 02/06/2020   History of stroke 01/23/2017   Dry eyes    Sleep disturbance    Essential hypertension, benign    Upper GI bleed    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   Fatty liver    Tobacco abuse    Diabetes mellitus type 2 in nonobese [facility])    History of CVA with residual deficit    Acute lower UTI    Wide-complex tachycardia    Chronic alcoholic pancreatitis (HCC)    Hyponatremia 03/09/2016   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   Acute blood loss anemia 03/09/2016   Gastric varices    Alcohol abuse    Left-sided neglect    Severe anemia    UGIB (upper gastrointestinal bleed)    Pressure ulcer 03/06/2016   Acute encephalopathy    Cerebral infarction (HCC) 03/05/2016   Carotid stenosis 04/06/2015   CAD in native artery 03/16/2015   Unstable angina pectoris (HCC) 03/15/2015   Neck pain 10/20/2013   Insomnia 12/29/2012   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 02/25/2011   Hepatic steatosis 02/25/2011   PCP:  Sharyne Degree, FNP Pharmacy:  No Pharmacies Listed    Social Drivers of Health (SDOH) Social History: SDOH Screenings   Food Insecurity: Patient Unable To Answer (01/08/2024)  Housing: Patient Declined (01/08/2024)  Transportation Needs: Patient Declined (01/08/2024)  Utilities: Patient  Declined (01/08/2024)  Physical Activity: Inactive (11/15/2021)  Stress: No Stress Concern Present (10/31/2021)  Tobacco Use: High Risk (01/06/2024)   SDOH Interventions:     Readmission Risk Interventions    01/08/2024   11:12 AM  Readmission Risk Prevention Plan  Transportation Screening Complete  Medication Review (RN Care Manager) Complete  PCP or Specialist appointment within 3-5 days of discharge Complete  HRI or Home Care Consult Complete  SW Recovery Care/Counseling Consult Complete  Palliative Care Screening Not Applicable  Skilled Nursing Facility Complete

## 2024-01-08 NOTE — Progress Notes (Signed)
 Patient ID: Brandon [name], male   DOB: 1965-11-15, 58 y.o.   MRN: [id number] Clinically a bit better with decreased white count.  No significant change in exam. Continue to monitor clinically. No plans for OR.

## 2024-01-08 NOTE — Plan of Care (Signed)
  Problem: Coping: Goal: Ability to adjust to condition or change in health will improve Outcome: Progressing   Problem: Clinical Measurements: Goal: Will remain free from infection Outcome: Progressing   Problem: Activity: Goal: Risk for activity intolerance will decrease Outcome: Progressing   Problem: Pain Managment: Goal: General experience of comfort will improve and/or be controlled Outcome: Progressing

## 2024-01-08 NOTE — Inpatient Diabetes Management (Signed)
 Inpatient Diabetes Program Recommendations  AACE/ADA: New Consensus Statement on Inpatient Glycemic Control (2015)  Target Ranges:  Prepandial:   less than 140 mg/dL      Peak postprandial:   less than 180 mg/dL (1-2 hours)      Critically ill patients:  140 - 180 mg/dL   Lab Results  Component Value Date   GLUCAP 247 (H) 01/08/2024   HGBA1C 9.4 (H) 01/07/2024    Review of Glycemic Control  Latest Reference Range & Units 01/07/24 13:18 01/07/24 14:20 01/07/24 15:50 01/07/24 16:53 01/07/24 18:42 01/07/24 23:32 01/08/24 07:48  Glucose-Capillary 70 - 99 mg/dL 086 (H) 578 (H) 469 (H) 148 (H) 146 (H) 209 (H) 247 (H)  (H): Data is abnormally high  Diabetes history: DM2 Outpatient Diabetes medications: Lantus 27 units every day, Humalog 2-10 units TID, Trulicity 1.5 mg weekly, Metformin 1000 mg BID Current orders for Inpatient glycemic control: Semglee 10 units every day (increasing to 15 units on 4/18), Novolog 0-9 units TID and 0-5 units QHS  Referral received for DKA/HHS/Hyperglycemia.  Patient is from [facility] L[facility] admitted with sepsis.  Agree with inpatient DM orders.    Will continue to follow while inpatient.  Thank you, Hays Lipschutz, MSN, CDCES Diabetes Coordinator Inpatient Diabetes Program [phone] (team pager from 8a-5p)

## 2024-01-09 ENCOUNTER — Inpatient Hospital Stay (HOSPITAL_COMMUNITY)

## 2024-01-09 DIAGNOSIS — N3080 Other cystitis without hematuria: Secondary | ICD-10-CM | POA: Diagnosis not present

## 2024-01-09 DIAGNOSIS — R7881 Bacteremia: Secondary | ICD-10-CM | POA: Diagnosis not present

## 2024-01-09 DIAGNOSIS — G062 Extradural and subdural abscess, unspecified: Secondary | ICD-10-CM | POA: Diagnosis not present

## 2024-01-09 DIAGNOSIS — I471 Supraventricular tachycardia, unspecified: Secondary | ICD-10-CM | POA: Diagnosis not present

## 2024-01-09 DIAGNOSIS — E1165 Type 2 diabetes mellitus with hyperglycemia: Secondary | ICD-10-CM | POA: Diagnosis not present

## 2024-01-09 LAB — ECHOCARDIOGRAM COMPLETE
AR max vel: 1.57 cm2
AV Area VTI: 1.7 cm2
AV Area mean vel: 1.62 cm2
AV Mean grad: 4 mmHg
AV Peak grad: 8.5 mmHg
Ao pk vel: 1.46 m/s
Area-P 1/2: 2.82 cm2
Calc EF: 66.5 %
MV VTI: 1.27 cm2
S' Lateral: 2.3 cm
Single Plane A2C EF: 70 %
Single Plane A4C EF: 60.3 %

## 2024-01-09 LAB — COMPREHENSIVE METABOLIC PANEL WITH GFR
ALT: 65 U/L — ABNORMAL HIGH (ref 0–44)
AST: 52 U/L — ABNORMAL HIGH (ref 15–41)
Albumin: 1.6 g/dL — ABNORMAL LOW (ref 3.5–5.0)
Alkaline Phosphatase: 224 U/L — ABNORMAL HIGH (ref 38–126)
Anion gap: 18 — ABNORMAL HIGH (ref 5–15)
BUN: 12 mg/dL (ref 6–20)
CO2: 16 mmol/L — ABNORMAL LOW (ref 22–32)
Calcium: 8.3 mg/dL — ABNORMAL LOW (ref 8.9–10.3)
Chloride: 98 mmol/L (ref 98–111)
Creatinine, Ser: 0.9 mg/dL (ref 0.61–1.24)
GFR, Estimated: >60 mL/min (ref >60)
Glucose, Bld: 336 mg/dL — ABNORMAL HIGH (ref 70–99)
Potassium: 3.9 mmol/L (ref 3.5–5.1)
Sodium: 132 mmol/L — ABNORMAL LOW (ref 135–145)
Total Bilirubin: 1.5 mg/dL — ABNORMAL HIGH (ref 0.0–1.2)
Total Protein: 6.3 g/dL — ABNORMAL LOW (ref 6.5–8.1)

## 2024-01-09 LAB — CULTURE, BLOOD (ROUTINE X 2)
Special Requests: ADEQUATE
Special Requests: ADEQUATE

## 2024-01-09 LAB — GLUCOSE, CAPILLARY
Glucose-Capillary: 345 mg/dL — ABNORMAL HIGH (ref 70–99)
Glucose-Capillary: 322 mg/dL — ABNORMAL HIGH (ref 70–99)
Glucose-Capillary: 235 mg/dL — ABNORMAL HIGH (ref 70–99)
Glucose-Capillary: 216 mg/dL — ABNORMAL HIGH (ref 70–99)

## 2024-01-09 LAB — CBC
HCT: 29.4 % — ABNORMAL LOW (ref 39.0–52.0)
Hemoglobin: 9.7 g/dL — ABNORMAL LOW (ref 13.0–17.0)
MCH: 30.3 pg (ref 26.0–34.0)
MCHC: 33 g/dL (ref 30.0–36.0)
MCV: 91.9 fL (ref 80.0–100.0)
Platelets: 467 10*3/uL — ABNORMAL HIGH (ref 150–400)
RBC: 3.2 MIL/uL — ABNORMAL LOW (ref 4.22–5.81)
RDW: 15.2 % (ref 11.5–15.5)
WBC: 17 10*3/uL — ABNORMAL HIGH (ref 4.0–10.5)
nRBC: 0 % (ref 0.0–0.2)

## 2024-01-09 LAB — URINE CULTURE: Culture: >=100000 — AB

## 2024-01-09 LAB — PHOSPHORUS: Phosphorus: 2.8 mg/dL (ref 2.5–4.6)

## 2024-01-09 LAB — MAGNESIUM: Magnesium: 1.8 mg/dL (ref 1.7–2.4)

## 2024-01-09 LAB — TSH: TSH: 0.496 u[IU]/mL (ref 0.350–4.500)

## 2024-01-09 LAB — C-REACTIVE PROTEIN: CRP: 20.8 mg/dL — ABNORMAL HIGH (ref <1.0)

## 2024-01-09 LAB — SEDIMENTATION RATE: Sed Rate: 105 mm/h — ABNORMAL HIGH (ref 0–16)

## 2024-01-09 MED ORDER — INSULIN ASPART 100 UNIT/ML IJ SOLN
4.0000 [IU] | Freq: Three times a day (TID) | INTRAMUSCULAR | Status: DC
  Administered 2024-01-09 – 2024-01-10 (×4): 4 [IU] via SUBCUTANEOUS

## 2024-01-09 MED ORDER — CLOPIDOGREL BISULFATE 75 MG PO TABS
75.0000 mg | ORAL_TABLET | Freq: Every day | ORAL | Status: DC
Start: 2024-01-09 — End: 2024-01-22
  Administered 2024-01-09 – 2024-01-21 (×13): 75 mg via ORAL
  Filled 2024-01-09 (×14): qty 1

## 2024-01-09 MED ORDER — INSULIN GLARGINE-YFGN 100 UNIT/ML ~~LOC~~ SOLN
20.0000 [IU] | Freq: Every day | SUBCUTANEOUS | Status: DC
Start: 2024-01-10 — End: 2024-01-11
  Administered 2024-01-10 – 2024-01-11 (×2): 20 [IU] via SUBCUTANEOUS
  Filled 2024-01-09 (×2): qty 0.2

## 2024-01-09 MED ORDER — INSULIN ASPART 100 UNIT/ML IJ SOLN
0.0000 [IU] | Freq: Three times a day (TID) | INTRAMUSCULAR | Status: DC
  Administered 2024-01-09: 5 [IU] via SUBCUTANEOUS
  Administered 2024-01-09 – 2024-01-10 (×3): 11 [IU] via SUBCUTANEOUS
  Administered 2024-01-11: 15 [IU] via SUBCUTANEOUS

## 2024-01-09 NOTE — Evaluation (Signed)
 Occupational Therapy Evaluation Patient Details Name: Brandon [name] MRN: [id number] DOB: [birth year] Today's Date: 01/09/2024   History of Present Illness   59 y.o. male brought to ED 4/15 from his skilled nursing facility due to worsening lower back pain. Brought in from his skilled nursing facility due to worsening lower back pain. 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 PMHx: CVA with residual right-sided hemiplegia, Carotid artery disease s/p L carotid stent, SAH, IPH, On seizure prophylaxis, CAD with PCI/stent RCA, diabetes type 2, hx pancreatitis with pancreatic insufficiency, bilateral renal artery stenosis, hypertension, malnutrition, BPH, GERD, mood disorder.     Clinical Impressions Unclear on patient's previous functional status and the evaluation was very limited.  Patient was in bed in a flexed position on his R side. He was lethargic and did not make eye contact with therapist.  His Right forearm had significant edema from the end of a coband wrap to his elbow and his L hand had moderate edema.  All attempts to reposition patient were met with agitation and patient asking OT to stop.  Overall he is total A with adls at this time. Will continue to see on acute to determine further recommendations. Likely back to SNF with therapy.      If plan is discharge home, recommend the following:   Two people to help with walking and/or transfers;Two people to help with bathing/dressing/bathroom;Assistance with feeding;Direct supervision/assist for medications management;Assist for transportation     Functional Status Assessment   Patient has had a recent decline in their functional status and demonstrates the ability to make significant improvements in function in a reasonable and predictable amount of time.     Equipment Recommendations   None recommended by OT     Recommendations for Other  Services         Precautions/Restrictions   Precautions Precautions: Fall Recall of Precautions/Restrictions: Impaired Precaution/Restrictions Comments: Back for comfort Restrictions Weight Bearing Restrictions Per Provider Order: No     Mobility Bed Mobility                    Transfers                          Balance                                           ADL either performed or assessed with clinical judgement   ADL Overall ADL's : Needs assistance/impaired Eating/Feeding: Total assistance;Bed level   Grooming: Total assistance;Bed level   Upper Body Bathing: Total assistance;Bed level   Lower Body Bathing: Total assistance;Bed level                       Functional mobility during ADLs: Total assistance;+2 for physical assistance       Vision         Perception         Praxis         Pertinent Vitals/Pain Pain Assessment Pain Assessment: Faces Faces Pain Scale: Hurts whole lot Pain Location: BUE with movement Pain Descriptors / Indicators: Grimacing, Guarding Pain Intervention(s): Limited activity within patient's tolerance     Extremity/Trunk Assessment Upper Extremity Assessment Upper Extremity Assessment: Right hand dominant;RUE deficits/detail;LUE deficits/detail (Difficult to assess due to  patient agitation) RUE Deficits / Details: Patient with significant edema in R forearm proximal to a coband wrap - nursing notified RUE: Unable to fully assess due to pain LUE Deficits / Details: Moderate edema in the L hand - able to passively range the fingers into extension and elbow flex / ext but refused shoulder range LUE: Unable to fully assess due to pain   Lower Extremity Assessment Lower Extremity Assessment:  (Patient in bed with legs flexed at hips and knees would not let OT reposition)       Communication Communication Communication: No apparent difficulties   Cognition Arousal:  Lethargic Behavior During Therapy: Agitated               OT - Cognition Comments: needs further assessment                 Following commands: Impaired Following commands impaired: Follows one step commands inconsistently, Follows one step commands with increased time     Cueing  General Comments   Cueing Techniques: Verbal cues;Tactile cues;Gestural cues  No family present   Exercises     Shoulder Instructions      Home Living Family/patient expects to be discharged to:: Skilled nursing facility                                 Additional Comments: Asking OT to leave him alone and not providing much information      Prior Functioning/Environment Prior Level of Function : Needs assist               ADLs Comments: Needs further clarification    OT Problem List: Decreased activity tolerance;Decreased cognition;Impaired UE functional use;Pain;Decreased range of motion;Impaired balance (sitting and/or standing)   OT Treatment/Interventions:        OT Goals(Current goals can be found in the care plan section)   Acute Rehab OT Goals OT Goal Formulation: Patient unable to participate in goal setting Time For Goal Achievement: 01/23/24 Potential to Achieve Goals: Fair   OT Frequency:  Min 1X/week    Co-evaluation              AM-PAC OT "6 Clicks" Daily Activity     Outcome Measure Help from another person eating meals?: Total Help from another person taking care of personal grooming?: Total Help from another person toileting, which includes using toliet, bedpan, or urinal?: Total Help from another person bathing (including washing, rinsing, drying)?: Total Help from another person to put on and taking off regular upper body clothing?: Total Help from another person to put on and taking off regular lower body clothing?: Total 6 Click Score: 6   End of Session Nurse Communication: Mobility status;Other (comment) (edema in  BUE)  Activity Tolerance: Patient limited by pain Patient left: in bed;with call bell/phone within reach;with bed alarm set  OT Visit Diagnosis: Other symptoms and signs involving the nervous system (R29.898);Hemiplegia and hemiparesis;Pain Hemiplegia - Right/Left: Left Hemiplegia - dominant/non-dominant: Non-Dominant Pain - part of body: Leg;Arm                Time: 4098-1191 OT Time Calculation (min): 14 min Charges:  OT General Charges $OT Visit: 1 Visit OT Evaluation $OT Eval Moderate Complexity: 1 Mod  Chantal Comment OTR/L   Clementina Cutter 01/09/2024, 2:25 PM

## 2024-01-09 NOTE — Progress Notes (Signed)
 Request seen for aspiration of Epidural abscess.  Images reviewed by Dr. Marlena Sima.  He does not recommend IR aspiration. Recommend Neurosurgery evaluation.  Lizmary Nader S Rasheeda Mulvehill PA-C 01/09/2024 9:54 AM

## 2024-01-09 NOTE — Progress Notes (Signed)
 PROGRESS NOTE        PATIENT DETAILS Name: Brandon [name] Age: 58 y.o. Sex: male Date of Birth: [birth year] Admit Date: 01/06/2024 Admitting Physician Arnulfo Larch, MD ZOX:[id number], [place] Kugel, FNP  Brief Summary: Patient is a 58 y.o.  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  Significant events: 4/15>> low back pain-emphysematous cystitis/gas in pelvis-admit to [facility]. 4/16>> MRI T-spine/L-spine with epidural abscess from T10-11 to L5-S1.  Significant studies: 4/15>> CT abdomen/pelvis: Extensive emphysematous cystitis-with mild extravesical extension of gas along both pelvic sidewalls, scattered abnormal gas in the spinal canal and from T12-L5 level. 4/16>> MRI C-spine: No evidence of discitis/abscess 4/16>> MRI thoracic/lumbar spine: Epidural abscess spanning T10-11 to L5-S1, disc signal at L4-5 and L5-S1.  Lower cord/cauda equina is diffusely compressed by collection.  Significant microbiology data: 4/15>> blood culture: Strep pneumo/ESBL Klebsiella pneumoniae 4/17>> blood culture: No growth.  Procedures: None  Consults: ID Neurosurgery Cardiology. Urology  Subjective: Complains of pain in his leg/back-a little bit more cooperative today-but still very reluctant to let me examine him today-refused to move his legs  Objective: Vitals: Blood pressure (!) 151/96, pulse (!) 104, temperature 98.8 F (37.1 C), temperature source Oral, resp. rate (!) 23, SpO2 95%.   Exam: Gen Exam:Alert awake-not in any distress HEENT:atraumatic, normocephalic Chest: B/L clear to  auscultation anteriorly CVS:S1S2 regular Abdomen:soft non tender, non distended Extremities:no edema Neurology: Unable to be performed properly-lack of cooperation-predominantly due to pain. Skin: no rash  Pertinent Labs/Radiology:    Latest Ref Rng & Units 01/09/2024    5:00 AM 01/08/2024    4:13 AM 01/07/2024    4:57 AM  CBC  WBC 4.0 - 10.5 K/uL 17.0  20.2  26.0   Hemoglobin 13.0 - 17.0 g/dL 9.7  9.6  9.8   Hematocrit 39.0 - 52.0 % 29.4  28.6  29.3   Platelets 150 - 400 K/uL 467  432  436     Lab Results  Component Value Date   NA 132 (L) 01/09/2024   K 3.9 01/09/2024   CL 98 01/09/2024   CO2 16 (L) 01/09/2024      Assessment/Plan: Sepsis secondary to polymicrobial bacteremia with streptococci/Klebsiella pneumonia-along with emphysematous cystitis with T10-11 to L5-S1 epidural abscess with resultant cord compression Sepsis physiology gradually improving Uncooperative-but appears to have significant pain/weakness in bilateral lower extremities compared to upper  Evaluated by neurosurgeon-Dr. Alfornia Imam 4/16-does not feel that surgical intervention will improve clinical outcomes-recommends medical treatment (minimally ambulatory to nonambulatory over the past several months per neurosurgery note). Evaluated by urology-apart from antibiotics-possible Foley catheter placement (patient refused) and outpatient cystoscopy-no further inpatient recommendations. Continue meropenem  Repeat blood cultures negative so far TTE done on 4/18-currently pending. ID continues to follow.  SVT with aberrancy Maintaining sinus rhythm Telemetry monitoring Echo pending TSH with a.m. labs Continue metoprolol   Acute metabolic encephalopathy Secondary to sepsis physiology Improved-but easily agitated this morning.  Answer simple questions appropriately.  Hypokalemia/hypomagnesemia Repleted  Transaminitis Suspect this is sepsis/shock liver Downtrending-continue to follow-if worsens then will  initiate further  workup.  DM-2 (A1c 9.4 on 4/16) with uncontrolled hyperglycemia On insulin  infusion on initial presentation-has been switched to SQ insulin  CBGs on the higher side Increase Semglee  to 20 units Add 4 units of scheduled NovoLog  with meals.   Continue SSI  CBG (last 3)  Recent Labs    01/08/24 1709 01/08/24 2109 01/09/24 0924  GLUCAP 219* 290* 345*     History of CVA with significant left-sided hemiparesis Difficult exam but seems to have significant paraparesis/pain issues from cord compression due to epidural abscess Per patient he apparently was using a quad cane-however per neurosurgery-patient has been nonambulatory for several months.  CAD-s/p PCI in 2016 No anginal symptoms Since no plans by IR to aspirate epidural abscess-resume Plavix .  History of seizure disorder Keppra   History of chronic pancreatitis Presumably from prior history of EtOH use Continue Creon   BPH Flomax   History of EtOH use Apparently not drinkiNG anymore-SNF resident Will watch for withdrawal symptoms  History of tobacco abuse  Code status:   Code Status: Full Code   DVT Prophylaxis: SCDs Start: 01/06/24 2054   Family Communication: Sister-[name] Justice-[phone] left VM 4/17,4/18   Disposition Plan: Status is: Inpatient Remains inpatient appropriate because: Severity of illness   Planned Discharge Destination:Skilled nursing facility   Diet: Diet Order             Diet heart healthy/carb modified Fluid consistency: Thin  Diet effective now                     Antimicrobial agents: Anti-infectives (From admission, onward)    Start     Dose/Rate Route Frequency Ordered Stop   01/07/24 1130  meropenem  (MERREM ) 1 g in sodium chloride  0.9 % 100 mL IVPB        1 g 200 mL/hr over 30 Minutes Intravenous Every 8 hours 01/07/24 1120     01/06/24 2200  ceFEPIme  (MAXIPIME ) 2 g in sodium chloride  0.9 % 100 mL IVPB  Status:  Discontinued        2 g 200  mL/hr over 30 Minutes Intravenous Every 8 hours 01/06/24 2001 01/07/24 1120   01/06/24 2100  metroNIDAZOLE  (FLAGYL ) IVPB 500 mg  Status:  Discontinued        500 mg 100 mL/hr over 60 Minutes Intravenous Every 12 hours 01/06/24 2001 01/07/24 1120   01/06/24 1845  vancomycin  (VANCOCIN ) IVPB 1000 mg/200 mL premix  Status:  Discontinued        1,000 mg 200 mL/hr over 60 Minutes Intravenous 2 times daily 01/06/24 1839 01/07/24 0953   01/06/24 1830  cefTRIAXone  (ROCEPHIN ) 1 g in sodium chloride  0.9 % 100 mL IVPB        1 g 200 mL/hr over 30 Minutes Intravenous  Once 01/06/24 1819 01/06/24 1909   01/06/24 1800  ciprofloxacin  (CIPRO ) IVPB 200 mg  Status:  Discontinued        200 mg 100 mL/hr over 60 Minutes Intravenous  Once 01/06/24 1755 01/06/24 1819        MEDICATIONS: Scheduled Meds:  busPIRone   10 mg Oral BID   famotidine   20 mg Oral BID   insulin  aspart  0-9 Units Subcutaneous TID WC   insulin  glargine-yfgn  15 Units Subcutaneous Daily   levETIRAcetam   500 mg Oral BID   lipase/protease/amylase  36,000 Units Oral TID WC   metoprolol  tartrate  25 mg Oral Q6H   nicotine   14 mg Transdermal Daily   polyethylene glycol  17 g Oral Daily  rosuvastatin   5 mg Oral QHS   senna-docusate  2 tablet Oral QHS   sodium chloride  flush  3 mL Intravenous Q12H   tamsulosin   0.4 mg Oral QPC supper   traZODone   150 mg Oral QHS   Continuous Infusions:  meropenem  (MERREM ) IV 1 g (01/09/24 0541)   PRN Meds:.acetaminophen , albuterol , dextrose , HYDROmorphone  (DILAUDID ) injection, LORazepam , metoprolol  tartrate, oxyCODONE  **OR** oxyCODONE    I have personally reviewed following labs and imaging studies  LABORATORY DATA: CBC: Recent Labs  Lab 01/06/24 1214 01/06/24 2236 01/07/24 0457 01/08/24 0413 01/09/24 0500  WBC 25.3* 26.5* 26.0* 20.2* 17.0*  NEUTROABS 23.1* 25.0*  --   --   --   HGB 11.4* 11.2* 9.8* 9.6* 9.7*  HCT 35.2* 34.8* 29.3* 28.6* 29.4*  MCV 92.6 93.8 91.3 89.9 91.9  PLT PLATELET  CLUMPS NOTED ON SMEAR, UNABLE TO ESTIMATE 478* 436* 432* 467*    Basic Metabolic Panel: Recent Labs  Lab 01/06/24 1214 01/06/24 2236 01/07/24 0457 01/08/24 0413 01/09/24 0500  NA 129* 129* 130* 133* 132*  K 4.1 3.6 3.1* 3.1* 3.9  CL 92* 93* 95* 96* 98  CO2 21* 21* 21* 22 16*  GLUCOSE 307* 353* 241* 228* 336*  BUN 31* 26* 19 14 12   CREATININE 0.78 0.82 0.55* 0.65 0.90  CALCIUM  8.7* 8.6* 8.2* 8.4* 8.3*  MG  --  1.7 2.2 1.6* 1.8  PHOS  --  3.0 2.4*  --  2.8    GFR: Estimated Creatinine Clearance: 80.5 mL/min (by C-G formula based on SCr of 0.9 mg/dL).  Liver Function Tests: Recent Labs  Lab 01/06/24 1214 01/08/24 0413 01/09/24 0500  AST 90* 185* 52*  ALT 41 87* 65*  ALKPHOS 191* 205* 224*  BILITOT 1.2 0.9 1.5*  PROT 7.5 5.6* 6.3*  ALBUMIN 2.3* 1.5* 1.6*   Recent Labs  Lab 01/06/24 2130  LIPASE 20   No results for input(s): "AMMONIA" in the last 168 hours.  Coagulation Profile: Recent Labs  Lab 01/07/24 0457 01/08/24 0413  INR 1.5* 1.5*    Cardiac Enzymes: Recent Labs  Lab 01/06/24 2130  CKTOTAL 362    BNP (last 3 results) No results for input(s): "PROBNP" in the last 8760 hours.  Lipid Profile: No results for input(s): "CHOL", "HDL", "LDLCALC", "TRIG", "CHOLHDL", "LDLDIRECT" in the last 72 hours.  Thyroid  Function Tests: Recent Labs    01/09/24 0500  TSH 0.496    Anemia Panel: No results for input(s): "VITAMINB12", "FOLATE", "FERRITIN", "TIBC", "IRON", "RETICCTPCT" in the last 72 hours.  Urine analysis:    Component Value Date/Time   COLORURINE BLUE (A) 01/06/2024 1615   APPEARANCEUR TURBID (A) 01/06/2024 1615   LABSPEC 1.027 01/06/2024 1615   PHURINE 5.0 01/06/2024 1615   GLUCOSEU 150 (A) 01/06/2024 1615   HGBUR MODERATE (A) 01/06/2024 1615   BILIRUBINUR NEGATIVE 01/06/2024 1615   KETONESUR 5 (A) 01/06/2024 1615   PROTEINUR 100 (A) 01/06/2024 1615   UROBILINOGEN 0.2 02/16/2014 1742   NITRITE POSITIVE (A) 01/06/2024 1615    LEUKOCYTESUR TRACE (A) 01/06/2024 1615    Sepsis Labs: Lactic Acid, Venous    Component Value Date/Time   LATICACIDVEN 1.7 01/06/2024 2130    MICROBIOLOGY: Recent Results (from the past 240 hours)  Blood culture (routine x 2)     Status: Abnormal   Collection Time: 01/06/24  1:14 PM   Specimen: BLOOD  Result Value Ref Range Status   Specimen Description   Final    BLOOD SITE NOT SPECIFIED Performed at [facility], [address] [place], [place] [address]    Special Requests   Final    BOTTLES DRAWN AEROBIC AND ANAEROBIC Blood Culture adequate volume Performed at [facility], [address] [place], [place] [address]    Culture  Setup Time   Final    GRAM NEGATIVE RODS IN BOTH AEROBIC AND ANAEROBIC BOTTLES CRITICAL RESULT CALLED TO, READ BACK BY AND VERIFIED WITH: RN ED AT [facility] [id number] AT 1055 BY CM Performed at [facility], [address], [place], [place] [address]    Culture (A)  Final    KLEBSIELLA PNEUMONIAE Confirmed Extended Spectrum Beta-Lactamase Producer (ESBL).  In bloodstream infections from ESBL organisms, carbapenems are preferred over piperacillin/tazobactam. They are shown to have a lower risk of mortality.    Report Status 01/09/2024 FINAL  Final   Organism ID, Bacteria KLEBSIELLA PNEUMONIAE  Final      Susceptibility   Klebsiella pneumoniae - MIC*    AMPICILLIN >=32 RESISTANT Resistant     CEFEPIME  >=32 RESISTANT Resistant     CEFTAZIDIME RESISTANT Resistant     CEFTRIAXONE  >=64 RESISTANT Resistant     CIPROFLOXACIN  <=0.25 SENSITIVE Sensitive     GENTAMICIN >=16 RESISTANT Resistant     IMIPENEM <=0.25 SENSITIVE Sensitive     TRIMETH/SULFA >=320 RESISTANT Resistant     AMPICILLIN/SULBACTAM >=32 RESISTANT Resistant     PIP/TAZO <=4 SENSITIVE Sensitive ug/mL    * KLEBSIELLA PNEUMONIAE  Blood Culture ID Panel (Reflexed)     Status: Abnormal   Collection Time: 01/06/24  1:14 PM  Result Value Ref  Range Status   Enterococcus faecalis NOT DETECTED NOT DETECTED Final   Enterococcus Faecium NOT DETECTED NOT DETECTED Final   Listeria monocytogenes NOT DETECTED NOT DETECTED Final   Staphylococcus species NOT DETECTED NOT DETECTED Final   Staphylococcus aureus (BCID) NOT DETECTED NOT DETECTED Final   Staphylococcus epidermidis NOT DETECTED NOT DETECTED Final   Staphylococcus lugdunensis NOT DETECTED NOT DETECTED Final   Streptococcus species NOT DETECTED NOT DETECTED Final   Streptococcus agalactiae NOT DETECTED NOT DETECTED Final   Streptococcus pneumoniae NOT DETECTED NOT DETECTED Final   Streptococcus pyogenes NOT DETECTED NOT DETECTED Final   A.calcoaceticus-baumannii NOT DETECTED NOT DETECTED Final   Bacteroides fragilis NOT DETECTED NOT DETECTED Final   Enterobacterales DETECTED (A) NOT DETECTED Final    Comment: CRITICAL RESULT CALLED TO, READ BACK BY AND VERIFIED WITH: RN ED AT [facility] [id number] AT 1055 BY CM    Enterobacter cloacae complex NOT DETECTED NOT DETECTED Final   Escherichia coli NOT DETECTED NOT DETECTED Final   Klebsiella aerogenes NOT DETECTED NOT DETECTED Final   Klebsiella oxytoca NOT DETECTED NOT DETECTED Final   Klebsiella pneumoniae DETECTED (A) NOT DETECTED Final    Comment: CRITICAL RESULT CALLED TO, READ BACK BY AND VERIFIED WITH: RN ED AT [facility] [id number] AT 1055 BY CM    Proteus species NOT DETECTED NOT DETECTED Final   Salmonella species NOT DETECTED NOT DETECTED Final   Serratia marcescens NOT DETECTED NOT DETECTED Final   Haemophilus influenzae NOT DETECTED NOT DETECTED Final   Neisseria meningitidis NOT DETECTED NOT DETECTED Final   Pseudomonas aeruginosa NOT DETECTED NOT DETECTED Final   Stenotrophomonas maltophilia NOT DETECTED NOT DETECTED Final   Candida albicans NOT DETECTED NOT DETECTED Final   Candida auris NOT DETECTED NOT DETECTED Final   Candida glabrata NOT DETECTED NOT DETECTED Final   Candida krusei NOT DETECTED  NOT DETECTED  Final   Candida parapsilosis NOT DETECTED NOT DETECTED Final   Candida tropicalis NOT DETECTED NOT DETECTED Final   Cryptococcus neoformans/gattii NOT DETECTED NOT DETECTED Final   CTX-M ESBL DETECTED (A) NOT DETECTED Final    Comment: CRITICAL RESULT CALLED TO, READ BACK BY AND VERIFIED WITH: RN ED AT [facility] [id number] AT 1055 BY CM (NOTE) Extended spectrum beta-lactamase detected. Recommend a carbapenem as initial therapy.      Carbapenem resistance IMP NOT DETECTED NOT DETECTED Final   Carbapenem resistance KPC NOT DETECTED NOT DETECTED Final   Carbapenem resistance NDM NOT DETECTED NOT DETECTED Final   Carbapenem resist OXA 48 LIKE NOT DETECTED NOT DETECTED Final   Carbapenem resistance VIM NOT DETECTED NOT DETECTED Final    Comment: Performed at [facility], [address], [place], [place] [address]  Urine Culture     Status: Abnormal (Preliminary result)   Collection Time: 01/06/24  4:15 PM   Specimen: Urine, Clean Catch  Result Value Ref Range Status   Specimen Description   Final    URINE, CLEAN CATCH Performed at [facility], [address] [place], [place] [address]    Special Requests   Final    NONE Performed at [facility], [address] [place], [place] [address]    Culture (A)  Final    >=100,000 COLONIES/mL KLEBSIELLA PNEUMONIAE >=100,000 COLONIES/mL ESCHERICHIA COLI SUSCEPTIBILITIES TO FOLLOW Performed at [facility], [address] [place], [place] [address]    Report Status PENDING  Incomplete  Blood culture (routine x 2)     Status: Abnormal (Preliminary result)   Collection Time: 01/06/24  4:16 PM   Specimen: BLOOD LEFT HAND  Result Value Ref Range Status   Specimen Description   Final    BLOOD LEFT HAND Performed at [facility], [address], [place], [place] [address]    Special Requests   Final    BOTTLES DRAWN AEROBIC AND ANAEROBIC Blood Culture adequate volume Performed at  [facility], [address] [place], [place] [address]    Culture  Setup Time   Final    GRAM POSITIVE COCCI IN CLUSTERS IN BOTH AEROBIC AND ANAEROBIC BOTTLES PREVIOUSLY REPORTED AS: GRAM NEGATIVE RODS CORRECTED RESULTS CALLED TO: PHARMD C. AMEND [id number] @2012  FH    Culture (A)  Final    STREPTOCOCCUS PNEUMONIAE SUSCEPTIBILITIES TO FOLLOW Performed at [facility], [address], [place], [place] [address]    Report Status PENDING  Incomplete  Blood Culture ID Panel (Reflexed)     Status: Abnormal   Collection Time: 01/06/24  4:16 PM  Result Value Ref Range Status   Enterococcus faecalis NOT DETECTED NOT DETECTED Final   Enterococcus Faecium NOT DETECTED NOT DETECTED Final   Listeria monocytogenes NOT DETECTED NOT DETECTED Final   Staphylococcus species NOT DETECTED NOT DETECTED Final   Staphylococcus aureus (BCID) NOT DETECTED NOT DETECTED Final   Staphylococcus epidermidis NOT DETECTED NOT DETECTED Final   Staphylococcus lugdunensis NOT DETECTED NOT DETECTED Final   Streptococcus species DETECTED (A) NOT DETECTED Final    Comment: CRITICAL RESULT CALLED TO, READ BACK BY AND VERIFIED WITH: PHARMD C. AMEND [phone] @2012  FH    Streptococcus agalactiae NOT DETECTED NOT DETECTED Final   Streptococcus pneumoniae DETECTED (A) NOT DETECTED Final    Comment: CRITICAL RESULT CALLED TO, READ BACK BY AND VERIFIED WITH: PHARMD C. AMEND [id number] @2012  FH  Streptococcus pyogenes NOT DETECTED NOT DETECTED Final   A.calcoaceticus-baumannii NOT DETECTED NOT DETECTED Final   Bacteroides fragilis NOT DETECTED NOT DETECTED Final   Enterobacterales NOT DETECTED NOT DETECTED Final   Enterobacter cloacae complex NOT DETECTED NOT DETECTED Final   Escherichia coli NOT DETECTED NOT DETECTED Final   Klebsiella aerogenes NOT DETECTED NOT DETECTED Final   Klebsiella oxytoca NOT DETECTED NOT DETECTED Final   Klebsiella pneumoniae NOT DETECTED NOT DETECTED Final   Proteus  species NOT DETECTED NOT DETECTED Final   Salmonella species NOT DETECTED NOT DETECTED Final   Serratia marcescens NOT DETECTED NOT DETECTED Final   Haemophilus influenzae NOT DETECTED NOT DETECTED Final   Neisseria meningitidis NOT DETECTED NOT DETECTED Final   Pseudomonas aeruginosa NOT DETECTED NOT DETECTED Final   Stenotrophomonas maltophilia NOT DETECTED NOT DETECTED Final   Candida albicans NOT DETECTED NOT DETECTED Final   Candida auris NOT DETECTED NOT DETECTED Final   Candida glabrata NOT DETECTED NOT DETECTED Final   Candida krusei NOT DETECTED NOT DETECTED Final   Candida parapsilosis NOT DETECTED NOT DETECTED Final   Candida tropicalis NOT DETECTED NOT DETECTED Final   Cryptococcus neoformans/gattii NOT DETECTED NOT DETECTED Final    Comment: Performed at [facility], [address] [place], [place] [address]  Culture, blood (Routine X 2) w Reflex to ID Panel     Status: None (Preliminary result)   Collection Time: 01/08/24 10:32 AM   Specimen: BLOOD LEFT ARM  Result Value Ref Range Status   Specimen Description BLOOD LEFT ARM  Final   Special Requests   Final    BOTTLES DRAWN AEROBIC AND ANAEROBIC Blood Culture adequate volume   Culture   Final    NO GROWTH < 24 HOURS Performed at [facility], [address] [place], [place] [address]    Report Status PENDING  Incomplete  Culture, blood (Routine X 2) w Reflex to ID Panel     Status: None (Preliminary result)   Collection Time: 01/08/24 10:34 AM   Specimen: BLOOD  Result Value Ref Range Status   Specimen Description BLOOD SITE NOT SPECIFIED  Final   Special Requests AEROBIC BOTTLE ONLY Blood Culture adequate volume  Final   Culture   Final    NO GROWTH < 24 HOURS Performed at [facility], [address], [place], [place] [address]    Report Status PENDING  Incomplete    RADIOLOGY STUDIES/RESULTS: No results found.    LOS: 3 days   Kimberly Penna, MD  [facility]    To  contact the attending provider between 7A-7P or the covering provider during after hours 7P-7A, please log into the web site [email or web address] and access using universal [place] password for that web site. If you do not have the password, please call the hospital operator.  01/09/2024, 11:41 AM

## 2024-01-09 NOTE — Progress Notes (Signed)
  Echocardiogram 2D Echocardiogram has been performed.  Latrease Kunde L Yonathan Perrow RDCS 01/09/2024, 8:15 AM

## 2024-01-09 NOTE — Plan of Care (Signed)

## 2024-01-09 NOTE — Inpatient Diabetes Management (Signed)
 Inpatient Diabetes Program Recommendations  AACE/ADA: New Consensus Statement on Inpatient Glycemic Control (2015)  Target Ranges:  Prepandial:   less than 140 mg/dL      Peak postprandial:   less than 180 mg/dL (1-2 hours)      Critically ill patients:  140 - 180 mg/dL   Lab Results  Component Value Date   GLUCAP 290 (H) 01/08/2024   HGBA1C 9.4 (H) 01/07/2024    Review of Glycemic Control  Latest Reference Range & Units 01/08/24 07:48 01/08/24 11:41 01/08/24 17:09 01/08/24 21:09 01/09/24 09:24  Glucose-Capillary 70 - 99 mg/dL 752 (H) 763 (H) 780 (H) 290 (H) 345 (H)  (H): Data is abnormally high  Diabetes history: DM2 Outpatient Diabetes medications: Lantus  27 units every day, Humalog 2-10 units TID, Metformin  1000 mg BID Current orders for Inpatient glycemic control: Semglee  15 units every day, Novolog  0-9 units TID  Inpatient Diabetes Program Recommendations:    Might consider:  Semglee  22 unist every day Novolog  3 units TID with meals if he consumes at least 50%  Will continue to follow while inpatient.  Thank you, Wyvonna Pinal, MSN, CDCES Diabetes Coordinator Inpatient Diabetes Program [phone] (team pager from 8a-5p)

## 2024-01-10 DIAGNOSIS — G062 Extradural and subdural abscess, unspecified: Secondary | ICD-10-CM | POA: Diagnosis not present

## 2024-01-10 LAB — GLUCOSE, CAPILLARY
Glucose-Capillary: 306 mg/dL — ABNORMAL HIGH (ref 70–99)
Glucose-Capillary: 317 mg/dL — ABNORMAL HIGH (ref 70–99)
Glucose-Capillary: 151 mg/dL — ABNORMAL HIGH (ref 70–99)
Glucose-Capillary: 229 mg/dL — ABNORMAL HIGH (ref 70–99)

## 2024-01-10 LAB — BASIC METABOLIC PANEL WITH GFR
Anion gap: 11 (ref 5–15)
BUN: 10 mg/dL (ref 6–20)
CO2: 25 mmol/L (ref 22–32)
Calcium: 8.5 mg/dL — ABNORMAL LOW (ref 8.9–10.3)
Chloride: 99 mmol/L (ref 98–111)
Creatinine, Ser: 0.64 mg/dL (ref 0.61–1.24)
GFR, Estimated: >60 mL/min (ref >60)
Glucose, Bld: 269 mg/dL — ABNORMAL HIGH (ref 70–99)
Potassium: 3.4 mmol/L — ABNORMAL LOW (ref 3.5–5.1)
Sodium: 135 mmol/L (ref 135–145)

## 2024-01-10 LAB — CBC
HCT: 29.9 % — ABNORMAL LOW (ref 39.0–52.0)
Hemoglobin: 9.7 g/dL — ABNORMAL LOW (ref 13.0–17.0)
MCH: 30 pg (ref 26.0–34.0)
MCHC: 32.4 g/dL (ref 30.0–36.0)
MCV: 92.6 fL (ref 80.0–100.0)
Platelets: 494 10*3/uL — ABNORMAL HIGH (ref 150–400)
RBC: 3.23 MIL/uL — ABNORMAL LOW (ref 4.22–5.81)
RDW: 15.4 % (ref 11.5–15.5)
WBC: 15.1 10*3/uL — ABNORMAL HIGH (ref 4.0–10.5)
nRBC: 0 % (ref 0.0–0.2)

## 2024-01-10 LAB — C-REACTIVE PROTEIN: CRP: 16.4 mg/dL — ABNORMAL HIGH (ref <1.0)

## 2024-01-10 LAB — PROCALCITONIN: Procalcitonin: 0.49 ng/mL

## 2024-01-10 MED ORDER — POTASSIUM CHLORIDE CRYS ER 20 MEQ PO TBCR
40.0000 meq | EXTENDED_RELEASE_TABLET | Freq: Once | ORAL | Status: AC
  Administered 2024-01-10: 40 meq via ORAL
  Filled 2024-01-10: qty 2

## 2024-01-10 NOTE — Progress Notes (Signed)
 PROGRESS NOTE        PATIENT DETAILS Name: Brandon [name] Age: 58 y.o. Sex: male Date of Birth: 12/07/1965 Admit Date: 01/06/2024 Admitting Physician Arnulfo Larch, MD ZOX:[id number], [place] Kugel, FNP  Brief Summary: Patient is a 58 y.o.  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  Significant events: 4/15>> low back pain-emphysematous cystitis/gas in pelvis-admit to [facility]. 4/16>> MRI T-spine/L-spine with epidural abscess from T10-11 to L5-S1.  Significant studies: 4/15>> CT abdomen/pelvis: Extensive emphysematous cystitis-with mild extravesical extension of gas along both pelvic sidewalls, scattered abnormal gas in the spinal canal and from T12-L5 level. 4/16>> MRI C-spine: No evidence of discitis/abscess 4/16>> MRI thoracic/lumbar spine: Epidural abscess spanning T10-11 to L5-S1, disc signal at L4-5 and L5-S1.  Lower cord/cauda equina is diffusely compressed by collection.  Significant microbiology data: 4/15>> blood culture: Strep pneumo/ESBL Klebsiella pneumoniae 4/17>> blood culture: No growth.  Procedures:  Echocardiogram -  1. Left ventricular ejection fraction, by estimation, is 65 to 70%. The left ventricle has normal function. There is mild left ventricular hypertrophy. Left ventricular diastolic parameters were normal.  2. Right ventricular systolic function is normal. The right ventricular size is normal.  3. The mitral valve is normal in structure. No evidence of mitral valve regurgitation.  4. AV is thickened, moderately calcified with no significant stenosis.. The  aortic valve is tricuspid. Aortic valve regurgitation is not visualized. Aortic valve sclerosis/calcification is present, without any evidence of aortic stenosis.   CAD-s/p PCI in 2016 No anginal symptoms Since no plans by IR to aspirate epidural abscess-resume Plavix .  History of seizure disorder Keppra   History of chronic pancreatitis Presumably from prior history of EtOH use Continue Creon   BPH Flomax   History of EtOH use Apparently not drinkiNG anymore-SNF resident Will watch for withdrawal symptoms  History of tobacco abuse - counseled to quit  Consults: ID Neurosurgery Cardiology. Urology  Subjective:  Patient in bed, appears comfortable, denies any headache, no fever, no chest pain or pressure, no shortness of breath , no abdominal pain. No new focal weakness.   Objective: Vitals: Blood pressure 136/83, pulse 94, temperature 98 F (36.7 C), temperature source Oral, resp. rate 19, SpO2 94%.   Exam:  Frail and cachectic, middle-aged white male, awake Alert, No new F.N deficits, chronic left-sided hemiparesis from previous stroke, condom catheter in place, [place].AT,PERRAL Supple Neck, No JVD,   Symmetrical Chest wall movement, Good air movement bilaterally, CTAB RRR,No Gallops, Rubs or new Murmurs,  +ve B.Sounds, Abd Soft, No tenderness,   No Cyanosis, Clubbing or edema    Assessment/Plan:  Sepsis secondary to polymicrobial bacteremia with streptococci/Klebsiella pneumonia-along with emphysematous cystitis with T10-11 to L5-S1 epidural abscess with resultant cord compression Sepsis physiology gradually improving Uncooperative-but appears to have significant pain/weakness in bilateral lower extremities compared to upper  Evaluated by neurosurgeon-Dr. Alfornia Imam 4/16-does not feel that surgical intervention will improve clinical outcomes-recommends medical treatment (minimally ambulatory to nonambulatory over the past several months per neurosurgery  note). Evaluated by urology-apart from antibiotics-possible Foley catheter placement (patient refused) and outpatient cystoscopy-no  further inpatient recommendations. Continue meropenem  Repeat blood cultures negative so far TTE done on 4/18-appears nonacute ID continues to follow.  SVT with aberrancy Maintaining sinus rhythm Telemetry monitoring Echo pending TSH with a.m. labs Continue metoprolol   Acute metabolic encephalopathy Secondary to sepsis physiology Improved-but easily agitated this morning.  Answer simple questions appropriately.  Hypokalemia/hypomagnesemia Repleted  Transaminitis Suspect this is sepsis/shock liver Downtrending-continue to follow-if worsens then will initiate further workup.  History of CVA with significant left-sided hemiparesis Difficult exam but seems to have significant paraparesis/pain issues from cord compression due to epidural abscess Per patient he apparently was using a quad cane-however per neurosurgery-patient has been nonambulatory for several months.  CAD-s/p PCI in 2016 No anginal symptoms Since no plans by IR to aspirate epidural abscess-resume Plavix .  History of seizure disorder Keppra   History of chronic pancreatitis Presumably from prior history of EtOH use Continue Creon   BPH Flomax   History of EtOH use Apparently not drinkiNG anymore-SNF resident Will watch for withdrawal symptoms  History of tobacco abuse - counseled to quit  DM-2 (A1c 9.4 on 4/16) with uncontrolled hyperglycemia On insulin  infusion on initial presentation-has been switched to SQ insulin  CBGs on the higher side Increase Semglee  to 20 units Add 4 units of scheduled NovoLog  with meals.   Continue SSI  CBG (last 3)  Recent Labs    01/09/24 1820 01/09/24 2123 01/10/24 0805  GLUCAP 235* 216* 306*       Code status:   Code Status: Full Code   DVT Prophylaxis: SCDs Start: 01/06/24 2054   Family Communication: Sister-[name]  Justice-[phone] left VM 4/17,4/18   Disposition Plan: Status is: Inpatient Remains inpatient appropriate because: Severity of illness   Planned Discharge Destination:Skilled nursing facility   Diet: Diet Order             Diet heart healthy/carb modified Fluid consistency: Thin  Diet effective now                    MEDICATIONS: Scheduled Meds:  busPIRone   10 mg Oral BID   clopidogrel   75 mg Oral Daily   famotidine   20 mg Oral BID   insulin  aspart  0-15 Units Subcutaneous TID WC   insulin  aspart  4 Units Subcutaneous TID WC   insulin  glargine-yfgn  20 Units Subcutaneous Daily   levETIRAcetam   500 mg Oral BID   lipase/protease/amylase  36,000 Units Oral TID WC   metoprolol  tartrate  25 mg Oral Q6H   nicotine   14 mg Transdermal Daily   polyethylene glycol  17 g Oral Daily   potassium chloride   40 mEq Oral Once   rosuvastatin   5 mg Oral QHS   senna-docusate  2 tablet Oral QHS   sodium chloride  flush  3 mL Intravenous Q12H   tamsulosin   0.4 mg Oral QPC supper   traZODone   150 mg Oral QHS   Continuous Infusions:  meropenem  (MERREM ) IV 1 g (01/10/24 0610)   PRN Meds:.acetaminophen , albuterol , dextrose , HYDROmorphone  (DILAUDID ) injection, LORazepam , metoprolol  tartrate, oxyCODONE  **OR** oxyCODONE    I have personally reviewed following labs and imaging studies  LABORATORY DATA: Recent Labs  Lab 01/06/24 1214 01/06/24 2236 01/07/24 0457 01/08/24 0413 01/09/24 0500 01/10/24 0513  WBC 25.3* 26.5* 26.0* 20.2* 17.0* 15.1*  HGB 11.4* 11.2* 9.8* 9.6* 9.7* 9.7*  HCT 35.2* 34.8* 29.3* 28.6* 29.4* 29.9*  PLT PLATELET CLUMPS NOTED ON SMEAR, UNABLE TO ESTIMATE 478* 436* 432* 467* 494*  MCV 92.6 93.8 91.3 89.9 91.9 92.6  MCH 30.0  30.2 30.5 30.2 30.3 30.0  MCHC 32.4 32.2 33.4 33.6 33.0 32.4  RDW 14.5 14.7 14.6 14.6 15.2 15.4  LYMPHSABS 0.8 0.8  --   --   --   --   MONOABS 1.2* 0.5  --   --   --   --   EOSABS 0.0 0.0  --   --   --   --   BASOSABS 0.0 0.0  --   --    --   --     Recent Labs  Lab 01/06/24 1214 01/06/24 1325 01/06/24 2130 01/06/24 2236 01/07/24 0457 01/08/24 0413 01/09/24 0500 01/10/24 0513  NA 129*  --   --  129* 130* 133* 132* 135  K 4.1  --   --  3.6 3.1* 3.1* 3.9 3.4*  CL 92*  --   --  93* 95* 96* 98 99  CO2 21*  --   --  21* 21* 22 16* 25  ANIONGAP 16*  --   --  15 14 15  18* 11  GLUCOSE 307*  --   --  353* 241* 228* 336* 269*  BUN 31*  --   --  26* 19 14 12 10   CREATININE 0.78  --   --  0.82 0.55* 0.65 0.90 0.64  AST 90*  --   --   --   --  185* 52*  --   ALT 41  --   --   --   --  87* 65*  --   ALKPHOS 191*  --   --   --   --  205* 224*  --   BILITOT 1.2  --   --   --   --  0.9 1.5*  --   ALBUMIN 2.3*  --   --   --   --  1.5* 1.6*  --   CRP  --   --   --   --   --   --  20.8*  --   LATICACIDVEN  --  1.8 1.7  --   --   --   --   --   INR  --   --   --   --  1.5* 1.5*  --   --   TSH  --   --   --   --   --   --  0.496  --   HGBA1C  --   --   --   --  9.4*  --   --   --   MG  --   --   --  1.7 2.2 1.6* 1.8  --   PHOS  --   --   --  3.0 2.4*  --  2.8  --   CALCIUM  8.7*  --   --  8.6* 8.2* 8.4* 8.3* 8.5*      Recent Labs  Lab 01/06/24 1325 01/06/24 2130 01/06/24 2236 01/07/24 0457 01/08/24 0413 01/09/24 0500 01/10/24 0513  CRP  --   --   --   --   --  20.8*  --   LATICACIDVEN 1.8 1.7  --   --   --   --   --   INR  --   --   --  1.5* 1.5*  --   --   TSH  --   --   --   --   --  0.496  --   HGBA1C  --   --   --  9.4*  --   --   --   MG  --   --  1.7 2.2 1.6* 1.8  --   CALCIUM   --   --  8.6* 8.2* 8.4* 8.3* 8.5*    --------------------------------------------------------------------------------------------------------------- Lab Results  Component Value Date   CHOL 74 03/06/2016   HDL 27 (L) 03/06/2016   LDLCALC 37 03/06/2016   TRIG 51 03/06/2016   CHOLHDL 2.7 03/06/2016    Lab Results  Component Value Date   HGBA1C 9.4 (H) 01/07/2024   Recent Labs    01/09/24 0500  TSH 0.496   Radiology  Reports  ECHOCARDIOGRAM COMPLETE Result Date: 01/09/2024    ECHOCARDIOGRAM REPORT   Patient Name:   Brandon [name] Date of Exam: 01/09/2024 Medical Rec #:  [id number]        Height:       72.0 in Accession #:    [id number]       Weight:       142.0 lb Date of Birth:  1966/07/17        BSA:          1.842 m Patient Age:    57 years         BP:           151/96 mmHg Patient Gender: M                HR:           92 bpm. Exam Location:  Inpatient Procedure: 2D Echo, Cardiac Doppler and Color Doppler (Both Spectral and Color            Flow Doppler were utilized during procedure). Indications:    Bacteremia  History:        Patient has prior history of Echocardiogram examinations, most                 recent 02/07/2020. CAD, Stroke; Risk Factors:Hypertension,                 Dyslipidemia and Diabetes. SVT.  Sonographer:    Juanita Shaw Referring Phys: [id number] Tylene Galla M GHIMIRE IMPRESSIONS  1. Left ventricular ejection fraction, by estimation, is 65 to 70%. The left ventricle has normal function. There is mild left ventricular hypertrophy. Left ventricular diastolic parameters were normal.  2. Right ventricular systolic function is normal. The right ventricular size is normal.  3. The mitral valve is normal in structure. No evidence of mitral valve regurgitation.  4. AV is thickened, moderately calcified with no significant stenosis.. The aortic valve is tricuspid. Aortic valve regurgitation is not visualized. Aortic valve sclerosis/calcification is present, without any evidence of aortic stenosis. Comparison(s): The left ventricular function is unchanged. FINDINGS  Left Ventricle: Left ventricular ejection fraction, by estimation, is 65 to 70%. The left ventricle has normal function. The left ventricular internal cavity size was normal in size. There is mild left ventricular hypertrophy. Left ventricular diastolic  parameters were normal. Right Ventricle: The right ventricular size is normal. Right vetricular wall  thickness was not assessed. Right ventricular systolic function is normal. Left Atrium: Left atrial size was normal in size. Right Atrium: Right atrial size was normal in size. Pericardium: There is no evidence of pericardial effusion. Mitral Valve: The mitral valve is normal in structure. No evidence of mitral valve regurgitation. MV peak gradient, 10.0 mmHg. The mean mitral valve gradient is 3.0 mmHg. Aortic Valve: AV is thickened, moderately calcified with no significant stenosis. The aortic valve is tricuspid.  Aortic valve regurgitation is not visualized. Aortic valve sclerosis/calcification is present, without any evidence of aortic stenosis. Aortic valve mean gradient measures 4.0 mmHg. Aortic valve peak gradient measures 8.5 mmHg. Aortic valve area, by VTI measures 1.70 cm. Pulmonic Valve: The pulmonic valve was not well visualized. Pulmonic valve regurgitation is not visualized. Aorta: The aortic root and ascending aorta are structurally normal, with no evidence of dilitation. IAS/Shunts: No atrial level shunt detected by color flow Doppler.  LEFT VENTRICLE PLAX 2D LVIDd:         3.60 cm      Diastology LVIDs:         2.30 cm      LV e' medial:    6.31 cm/s LV PW:         0.90 cm      LV E/e' medial:  9.3 LV IVS:        1.20 cm      LV e' lateral:   6.20 cm/s LVOT diam:     2.00 cm      LV E/e' lateral: 9.4 LV SV:         35 LV SV Index:   19 LVOT Area:     3.14 cm  LV Volumes (MOD) LV vol d, MOD A2C: 101.0 ml LV vol d, MOD A4C: 115.0 ml LV vol s, MOD A2C: 30.3 ml LV vol s, MOD A4C: 45.7 ml LV SV MOD A2C:     70.7 ml LV SV MOD A4C:     115.0 ml LV SV MOD BP:      74.2 ml RIGHT VENTRICLE RV Basal diam:  2.80 cm RV Mid diam:    1.60 cm RV S prime:     21.40 cm/s TAPSE (M-mode): 2.0 cm LEFT ATRIUM             Index        RIGHT ATRIUM          Index LA diam:        4.20 cm 2.28 cm/m   RA Area:     5.28 cm LA Vol (A2C):   22.6 ml 12.27 ml/m  RA Volume:   5.54 ml  3.01 ml/m LA Vol (A4C):   23.0 ml 12.49  ml/m LA Biplane Vol: 23.2 ml 12.60 ml/m  AORTIC VALVE                    PULMONIC VALVE AV Area (Vmax):    1.57 cm     PV Vmax:       0.87 m/s AV Area (Vmean):   1.62 cm     PV Peak grad:  3.0 mmHg AV Area (VTI):     1.70 cm AV Vmax:           146.00 cm/s AV Vmean:          83.500 cm/s AV VTI:            0.209 m AV Peak Grad:      8.5 mmHg AV Mean Grad:      4.0 mmHg LVOT Vmax:         72.90 cm/s LVOT Vmean:        43.000 cm/s LVOT VTI:          0.113 m LVOT/AV VTI ratio: 0.54  AORTA Ao Root diam: 3.30 cm Ao Asc diam:  3.00 cm MITRAL VALVE MV Area (PHT): 2.82 cm    SHUNTS MV Area VTI:  1.27 cm    Systemic VTI:  0.11 m MV Peak grad:  10.0 mmHg   Systemic Diam: 2.00 cm MV Mean grad:  3.0 mmHg MV Vmax:       1.58 m/s MV Vmean:      77.8 cm/s MV Decel Time: 269 msec MV E velocity: 58.40 cm/s MV A velocity: 80.20 cm/s MV E/A ratio:  0.73 Ola Berger MD Electronically signed by Ola Berger MD Signature Date/Time: 01/09/2024/12:08:21 PM    Final     RADIOLOGY STUDIES/RESULTS: ECHOCARDIOGRAM COMPLETE Result Date: 01/09/2024    ECHOCARDIOGRAM REPORT   Patient Name:   Brandon [name] Date of Exam: 01/09/2024 Medical Rec #:  [id number]        Height:       72.0 in Accession #:    [id number]       Weight:       142.0 lb Date of Birth:  03/10/1966        BSA:          1.842 m Patient Age:    57 years         BP:           151/96 mmHg Patient Gender: M                HR:           92 bpm. Exam Location:  Inpatient Procedure: 2D Echo, Cardiac Doppler and Color Doppler (Both Spectral and Color            Flow Doppler were utilized during procedure). Indications:    Bacteremia  History:        Patient has prior history of Echocardiogram examinations, most                 recent 02/07/2020. CAD, Stroke; Risk Factors:Hypertension,                 Dyslipidemia and Diabetes. SVT.  Sonographer:    Juanita Shaw Referring Phys: [id number] Tylene Galla M GHIMIRE IMPRESSIONS  1. Left ventricular ejection fraction, by estimation, is 65 to 70%.  The left ventricle has normal function. There is mild left ventricular hypertrophy. Left ventricular diastolic parameters were normal.  2. Right ventricular systolic function is normal. The right ventricular size is normal.  3. The mitral valve is normal in structure. No evidence of mitral valve regurgitation.  4. AV is thickened, moderately calcified with no significant stenosis.. The aortic valve is tricuspid. Aortic valve regurgitation is not visualized. Aortic valve sclerosis/calcification is present, without any evidence of aortic stenosis. Comparison(s): The left ventricular function is unchanged. FINDINGS  Left Ventricle: Left ventricular ejection fraction, by estimation, is 65 to 70%. The left ventricle has normal function. The left ventricular internal cavity size was normal in size. There is mild left ventricular hypertrophy. Left ventricular diastolic  parameters were normal. Right Ventricle: The right ventricular size is normal. Right vetricular wall thickness was not assessed. Right ventricular systolic function is normal. Left Atrium: Left atrial size was normal in size. Right Atrium: Right atrial size was normal in size. Pericardium: There is no evidence of pericardial effusion. Mitral Valve: The mitral valve is normal in structure. No evidence of mitral valve regurgitation. MV peak gradient, 10.0 mmHg. The mean mitral valve gradient is 3.0 mmHg. Aortic Valve: AV is thickened, moderately calcified with no significant stenosis. The aortic valve is tricuspid. Aortic valve regurgitation is not visualized. Aortic valve sclerosis/calcification is present, without any evidence of aortic stenosis.  Aortic valve mean gradient measures 4.0 mmHg. Aortic valve peak gradient measures 8.5 mmHg. Aortic valve area, by VTI measures 1.70 cm. Pulmonic Valve: The pulmonic valve was not well visualized. Pulmonic valve regurgitation is not visualized. Aorta: The aortic root and ascending aorta are structurally normal,  with no evidence of dilitation. IAS/Shunts: No atrial level shunt detected by color flow Doppler.  LEFT VENTRICLE PLAX 2D LVIDd:         3.60 cm      Diastology LVIDs:         2.30 cm      LV e' medial:    6.31 cm/s LV PW:         0.90 cm      LV E/e' medial:  9.3 LV IVS:        1.20 cm      LV e' lateral:   6.20 cm/s LVOT diam:     2.00 cm      LV E/e' lateral: 9.4 LV SV:         35 LV SV Index:   19 LVOT Area:     3.14 cm  LV Volumes (MOD) LV vol d, MOD A2C: 101.0 ml LV vol d, MOD A4C: 115.0 ml LV vol s, MOD A2C: 30.3 ml LV vol s, MOD A4C: 45.7 ml LV SV MOD A2C:     70.7 ml LV SV MOD A4C:     115.0 ml LV SV MOD BP:      74.2 ml RIGHT VENTRICLE RV Basal diam:  2.80 cm RV Mid diam:    1.60 cm RV S prime:     21.40 cm/s TAPSE (M-mode): 2.0 cm LEFT ATRIUM             Index        RIGHT ATRIUM          Index LA diam:        4.20 cm 2.28 cm/m   RA Area:     5.28 cm LA Vol (A2C):   22.6 ml 12.27 ml/m  RA Volume:   5.54 ml  3.01 ml/m LA Vol (A4C):   23.0 ml 12.49 ml/m LA Biplane Vol: 23.2 ml 12.60 ml/m  AORTIC VALVE                    PULMONIC VALVE AV Area (Vmax):    1.57 cm     PV Vmax:       0.87 m/s AV Area (Vmean):   1.62 cm     PV Peak grad:  3.0 mmHg AV Area (VTI):     1.70 cm AV Vmax:           146.00 cm/s AV Vmean:          83.500 cm/s AV VTI:            0.209 m AV Peak Grad:      8.5 mmHg AV Mean Grad:      4.0 mmHg LVOT Vmax:         72.90 cm/s LVOT Vmean:        43.000 cm/s LVOT VTI:          0.113 m LVOT/AV VTI ratio: 0.54  AORTA Ao Root diam: 3.30 cm Ao Asc diam:  3.00 cm MITRAL VALVE MV Area (PHT): 2.82 cm    SHUNTS MV Area VTI:   1.27 cm    Systemic VTI:  0.11 m MV Peak grad:  10.0 mmHg  Systemic Diam: 2.00 cm MV Mean grad:  3.0 mmHg MV Vmax:       1.58 m/s MV Vmean:      77.8 cm/s MV Decel Time: 269 msec MV E velocity: 58.40 cm/s MV A velocity: 80.20 cm/s MV E/A ratio:  0.73 Ola Berger MD Electronically signed by Ola Berger MD Signature Date/Time: 01/09/2024/12:08:21 PM    Final       LOS:  4 days   Signature  -    Lynnwood Sauer M.D on 01/10/2024 at 10:36 AM   -  To page go to [email or web address]

## 2024-01-10 NOTE — Plan of Care (Signed)

## 2024-01-10 NOTE — Progress Notes (Signed)
 Patient ID: Brandon [name], male   DOB: 10/14/1965, 58 y.o.   MRN: [id number] Vital signs are stable Patient's motor function remains unchanged.  Some pain to the palpation on the left lower extremity but not as severe and acute as it had been a few days ago not moving the left leg volitionally at all.  Has trace movement in the right leg with some spontaneous quivers.  Continues IV antibiotics.

## 2024-01-11 DIAGNOSIS — G062 Extradural and subdural abscess, unspecified: Secondary | ICD-10-CM | POA: Diagnosis not present

## 2024-01-11 LAB — CBC WITH DIFFERENTIAL/PLATELET
Abs Immature Granulocytes: 0.21 10*3/uL — ABNORMAL HIGH (ref 0.00–0.07)
Basophils Absolute: 0 10*3/uL (ref 0.0–0.1)
Basophils Relative: 0 %
Eosinophils Absolute: 0 10*3/uL (ref 0.0–0.5)
Eosinophils Relative: 0 %
HCT: 31.2 % — ABNORMAL LOW (ref 39.0–52.0)
Hemoglobin: 10.5 g/dL — ABNORMAL LOW (ref 13.0–17.0)
Immature Granulocytes: 1 %
Lymphocytes Relative: 7 %
Lymphs Abs: 1.1 10*3/uL (ref 0.7–4.0)
MCH: 30.5 pg (ref 26.0–34.0)
MCHC: 33.7 g/dL (ref 30.0–36.0)
MCV: 90.7 fL (ref 80.0–100.0)
Monocytes Absolute: 0.9 10*3/uL (ref 0.1–1.0)
Monocytes Relative: 6 %
Neutro Abs: 13.2 10*3/uL — ABNORMAL HIGH (ref 1.7–7.7)
Neutrophils Relative %: 86 %
Platelets: 531 10*3/uL — ABNORMAL HIGH (ref 150–400)
RBC: 3.44 MIL/uL — ABNORMAL LOW (ref 4.22–5.81)
RDW: 15.2 % (ref 11.5–15.5)
WBC: 15.5 10*3/uL — ABNORMAL HIGH (ref 4.0–10.5)
nRBC: 0 % (ref 0.0–0.2)

## 2024-01-11 LAB — COMPREHENSIVE METABOLIC PANEL WITH GFR
ALT: 91 U/L — ABNORMAL HIGH (ref 0–44)
AST: 132 U/L — ABNORMAL HIGH (ref 15–41)
Albumin: 1.5 g/dL — ABNORMAL LOW (ref 3.5–5.0)
Alkaline Phosphatase: 285 U/L — ABNORMAL HIGH (ref 38–126)
Anion gap: 8 (ref 5–15)
BUN: 11 mg/dL (ref 6–20)
CO2: 25 mmol/L (ref 22–32)
Calcium: 8.3 mg/dL — ABNORMAL LOW (ref 8.9–10.3)
Chloride: 96 mmol/L — ABNORMAL LOW (ref 98–111)
Creatinine, Ser: 0.53 mg/dL — ABNORMAL LOW (ref 0.61–1.24)
GFR, Estimated: >60 mL/min (ref >60)
Glucose, Bld: 386 mg/dL — ABNORMAL HIGH (ref 70–99)
Potassium: 4.2 mmol/L (ref 3.5–5.1)
Sodium: 129 mmol/L — ABNORMAL LOW (ref 135–145)
Total Bilirubin: 0.7 mg/dL (ref 0.0–1.2)
Total Protein: 6 g/dL — ABNORMAL LOW (ref 6.5–8.1)

## 2024-01-11 LAB — GLUCOSE, CAPILLARY
Glucose-Capillary: 352 mg/dL — ABNORMAL HIGH (ref 70–99)
Glucose-Capillary: 312 mg/dL — ABNORMAL HIGH (ref 70–99)
Glucose-Capillary: 170 mg/dL — ABNORMAL HIGH (ref 70–99)
Glucose-Capillary: 96 mg/dL (ref 70–99)

## 2024-01-11 LAB — SODIUM, URINE, RANDOM: Sodium, Ur: 58 mmol/L

## 2024-01-11 LAB — CREATININE, URINE, RANDOM: Creatinine, Urine: 42 mg/dL

## 2024-01-11 LAB — OSMOLALITY: Osmolality: 280 mosm/kg (ref 275–295)

## 2024-01-11 LAB — MAGNESIUM: Magnesium: 1.4 mg/dL — ABNORMAL LOW (ref 1.7–2.4)

## 2024-01-11 LAB — C-REACTIVE PROTEIN: CRP: 15.7 mg/dL — ABNORMAL HIGH (ref <1.0)

## 2024-01-11 LAB — OSMOLALITY, URINE: Osmolality, Ur: 611 mosm/kg (ref 300–900)

## 2024-01-11 LAB — URIC ACID: Uric Acid, Serum: 2 mg/dL — ABNORMAL LOW (ref 3.7–8.6)

## 2024-01-11 LAB — PROCALCITONIN: Procalcitonin: 0.23 ng/mL

## 2024-01-11 MED ORDER — INSULIN GLARGINE-YFGN 100 UNIT/ML ~~LOC~~ SOLN
10.0000 [IU] | Freq: Every day | SUBCUTANEOUS | Status: DC
  Administered 2024-01-11 – 2024-01-12 (×2): 10 [IU] via SUBCUTANEOUS
  Filled 2024-01-11 (×2): qty 0.1

## 2024-01-11 MED ORDER — INSULIN ASPART 100 UNIT/ML IJ SOLN: 4.0000 [IU] | Freq: Three times a day (TID) | INTRAMUSCULAR | Status: DC

## 2024-01-11 MED ORDER — INSULIN ASPART 100 UNIT/ML IJ SOLN: 0.0000 [IU] | Freq: Every day | INTRAMUSCULAR | Status: DC

## 2024-01-11 MED ORDER — INSULIN ASPART 100 UNIT/ML IJ SOLN
0.0000 [IU] | Freq: Three times a day (TID) | INTRAMUSCULAR | Status: DC
  Administered 2024-01-11: 11 [IU] via SUBCUTANEOUS
  Administered 2024-01-11: 3 [IU] via SUBCUTANEOUS
  Administered 2024-01-12 – 2024-01-15 (×2): 2 [IU] via SUBCUTANEOUS
  Administered 2024-01-15: 3 [IU] via SUBCUTANEOUS

## 2024-01-11 MED ORDER — INSULIN GLARGINE-YFGN 100 UNIT/ML ~~LOC~~ SOLN
30.0000 [IU] | Freq: Every day | SUBCUTANEOUS | Status: DC
  Administered 2024-01-12 – 2024-01-15 (×4): 30 [IU] via SUBCUTANEOUS
  Filled 2024-01-11 (×5): qty 0.3

## 2024-01-11 MED ORDER — INSULIN GLARGINE-YFGN 100 UNIT/ML ~~LOC~~ SOPN: 10.0000 [IU] | PEN_INJECTOR | Freq: Once | SUBCUTANEOUS | Status: DC

## 2024-01-11 MED ORDER — MAGNESIUM SULFATE 4 GM/100ML IV SOLN
4.0000 g | Freq: Once | INTRAVENOUS | Status: AC
  Administered 2024-01-11: 4 g via INTRAVENOUS
  Filled 2024-01-11: qty 100

## 2024-01-11 NOTE — Plan of Care (Signed)

## 2024-01-11 NOTE — Progress Notes (Addendum)
 PROGRESS NOTE        PATIENT DETAILS Name: Brandon [name] Age: 58 y.o. Sex: male Date of Birth: Sep 22, 1966 Admit Date: 01/06/2024 Admitting Physician Arnulfo Larch, MD OZH:[id number], [place] Kugel, FNP  Brief Summary: Patient is a 58 y.o.  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  Significant events: 4/15>> low back pain-emphysematous cystitis/gas in pelvis-admit to [facility]. 4/16>> MRI T-spine/L-spine with epidural abscess from T10-11 to L5-S1.  Significant studies: 4/15>> CT abdomen/pelvis: Extensive emphysematous cystitis-with mild extravesical extension of gas along both pelvic sidewalls, scattered abnormal gas in the spinal canal and from T12-L5 level. 4/16>> MRI C-spine: No evidence of discitis/abscess 4/16>> MRI thoracic/lumbar spine: Epidural abscess spanning T10-11 to L5-S1, disc signal at L4-5 and L5-S1.  Lower cord/cauda equina is diffusely compressed by collection.  Significant microbiology data: 4/15>> blood culture: Strep pneumo/ESBL Klebsiella pneumoniae 4/17>> blood culture: No growth.  Procedures:  Echocardiogram -  1. Left ventricular ejection fraction, by estimation, is 65 to 70%. The left ventricle has normal function. There is mild left ventricular hypertrophy. Left ventricular diastolic parameters were normal.  2. Right ventricular systolic function is normal. The right ventricular size is normal.  3. The mitral valve is normal in structure. No evidence of mitral valve regurgitation.  4. AV is thickened, moderately calcified with no significant stenosis.. The  aortic valve is tricuspid. Aortic valve regurgitation is not visualized. Aortic valve sclerosis/calcification is present, without any evidence of aortic stenosis.   CAD-s/p PCI in 2016 No anginal symptoms Since no plans by IR to aspirate epidural abscess-resume Plavix .  History of seizure disorder Keppra   History of chronic pancreatitis Presumably from prior history of EtOH use Continue Creon   BPH Flomax   History of EtOH use Apparently not drinkiNG anymore-SNF resident Will watch for withdrawal symptoms  History of tobacco abuse - counseled to quit  Consults: ID Neurosurgery Cardiology. Urology  Subjective:  Patient in bed, appears comfortable, denies any headache, no fever, no chest pain or pressure, no shortness of breath , no abdominal pain. No new focal weakness.   Objective: Vitals: Blood pressure 117/81, pulse 87, temperature 98.3 F (36.8 C), resp. rate 19, SpO2 93%.   Exam:  Frail and cachectic, middle-aged white male, awake Alert, No new F.N deficits, chronic left-sided hemiparesis from previous stroke, condom catheter in place, [place].AT,PERRAL Supple Neck, No JVD,   Symmetrical Chest wall movement, Good air movement bilaterally, CTAB RRR,No Gallops, Rubs or new Murmurs,  +ve B.Sounds, Abd Soft, No tenderness,   No Cyanosis, Clubbing or edema    Assessment/Plan:  Sepsis secondary to polymicrobial bacteremia with streptococci/Klebsiella pneumonia-along with emphysematous cystitis with T10-11 to L5-S1 epidural abscess with resultant cord compression Sepsis physiology gradually improving Uncooperative-but appears to have significant pain/weakness in bilateral lower extremities compared to upper  Evaluated by neurosurgeon-Dr. Alfornia Imam 4/16-does not feel that surgical intervention will improve clinical outcomes-recommends medical treatment (minimally ambulatory to nonambulatory over the past several months per neurosurgery note). Evaluated by urology-apart  from antibiotics-possible Foley catheter placement (patient refused) and outpatient cystoscopy-no further inpatient recommendations.  Continue meropenem  Repeat blood cultures negative so far TTE done on 4/18-appears nonacute ID continues to follow.  SVT with aberrancy Maintaining sinus rhythm Telemetry monitoring Echo pending TSH with a.m. labs Continue metoprolol   Acute metabolic encephalopathy Secondary to sepsis physiology Improved-but easily agitated this morning.  Answer simple questions appropriately.  Hyponatremia.  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  Monitor.    Hypokalemia/hypomagnesemia Repleted  Transaminitis Suspect this is sepsis/shock liver Downtrending-continue to follow-if worsens then will initiate further workup.  History of CVA with significant left-sided hemiparesis Difficult exam but seems to have significant paraparesis/pain issues from cord compression due to epidural abscess Per patient he apparently was using a quad cane-however per neurosurgery-patient has been nonambulatory for several months.  CAD-s/p PCI in 2016 No anginal symptoms Since no plans by IR to aspirate epidural abscess-resume Plavix .  History of seizure disorder Keppra   History of chronic pancreatitis Presumably from prior history of EtOH use Continue Creon   BPH Flomax   History of EtOH use Apparently not drinkiNG anymore-SNF resident Will watch for withdrawal symptoms  History of tobacco abuse - counseled to quit  DM-2 (A1c 9.4 on 4/16) with uncontrolled hyperglycemia CBG running high insulin  adjusted further on 01/11/2024 for better control  CBG (last 3)  Recent Labs    01/10/24 1606 01/10/24 1953 01/11/24 0803  GLUCAP 151* 229* 352*    Lab Results  Component Value Date   HGBA1C 9.4 (H) 01/07/2024      Code status:   Code Status: Full Code    DVT Prophylaxis: SCDs Start: 01/06/24 2054   Family Communication: Sister-[name] Justice-[phone] left VM 4/17,4/18   Disposition Plan: Status is: Inpatient Remains inpatient appropriate because: Severity of illness   Planned Discharge Destination:Skilled nursing facility   Diet: Diet Order             Diet heart healthy/carb modified Fluid consistency: Thin  Diet effective now                    MEDICATIONS: Scheduled Meds:  busPIRone   10 mg Oral BID   clopidogrel   75 mg Oral Daily   famotidine   20 mg Oral BID   insulin  aspart  0-15 Units Subcutaneous TID WC   insulin  aspart  0-5 Units Subcutaneous QHS   insulin  aspart  4 Units Subcutaneous TID WC   [START ON 01/12/2024] insulin  glargine-yfgn  30 Units Subcutaneous Daily   insulin  glargine-yfgn  10 Units Subcutaneous Once   levETIRAcetam   500 mg Oral BID   lipase/protease/amylase  36,000 Units Oral TID WC   metoprolol  tartrate  25 mg Oral Q6H   nicotine   14 mg Transdermal Daily   polyethylene glycol  17 g Oral Daily   rosuvastatin   5 mg Oral QHS   senna-docusate  2 tablet Oral QHS   sodium chloride  flush  3 mL Intravenous Q12H   tamsulosin   0.4 mg Oral QPC supper   traZODone   150 mg Oral QHS   Continuous Infusions:  magnesium  sulfate bolus IVPB     meropenem  (MERREM ) IV 1 g (01/11/24 0638)   PRN Meds:.acetaminophen , albuterol , dextrose , HYDROmorphone  (DILAUDID ) injection, LORazepam , metoprolol  tartrate, oxyCODONE  **OR** oxyCODONE    I have personally reviewed following labs and imaging studies  LABORATORY DATA: Recent Labs  Lab 01/06/24 1214 01/06/24 2236 01/07/24 0457 01/08/24 0413 01/09/24 0500 01/10/24 0513 01/11/24 0652  WBC 25.3* 26.5* 26.0* 20.2* 17.0*  15.1* 15.5*  HGB 11.4* 11.2* 9.8* 9.6* 9.7* 9.7* 10.5*  HCT 35.2* 34.8* 29.3* 28.6* 29.4* 29.9* 31.2*  PLT PLATELET CLUMPS NOTED ON SMEAR, UNABLE TO ESTIMATE 478* 436* 432* 467* 494* 531*  MCV 92.6 93.8 91.3 89.9 91.9 92.6 90.7  MCH 30.0  30.2 30.5 30.2 30.3 30.0 30.5  MCHC 32.4 32.2 33.4 33.6 33.0 32.4 33.7  RDW 14.5 14.7 14.6 14.6 15.2 15.4 15.2  LYMPHSABS 0.8 0.8  --   --   --   --  1.1  MONOABS 1.2* 0.5  --   --   --   --  0.9  EOSABS 0.0 0.0  --   --   --   --  0.0  BASOSABS 0.0 0.0  --   --   --   --  0.0    Recent Labs  Lab 01/06/24 1214 01/06/24 1325 01/06/24 2130 01/06/24 2236 01/07/24 0457 01/08/24 0413 01/09/24 0500 01/10/24 0513 01/10/24 1459 01/11/24 0652  NA 129*  --   --  129* 130* 133* 132* 135  --  129*  K 4.1  --   --  3.6 3.1* 3.1* 3.9 3.4*  --  4.2  CL 92*  --   --  93* 95* 96* 98 99  --  96*  CO2 21*  --   --  21* 21* 22 16* 25  --  25  ANIONGAP 16*  --   --  15 14 15  18* 11  --  8  GLUCOSE 307*  --   --  353* 241* 228* 336* 269*  --  386*  BUN 31*  --   --  26* 19 14 12 10   --  11  CREATININE 0.78  --   --  0.82 0.55* 0.65 0.90 0.64  --  0.53*  AST 90*  --   --   --   --  185* 52*  --   --  132*  ALT 41  --   --   --   --  87* 65*  --   --  91*  ALKPHOS 191*  --   --   --   --  205* 224*  --   --  285*  BILITOT 1.2  --   --   --   --  0.9 1.5*  --   --  0.7  ALBUMIN 2.3*  --   --   --   --  1.5* 1.6*  --   --  1.5*  CRP  --   --   --   --   --   --  20.8*  --  16.4* 15.7*  PROCALCITON  --   --   --   --   --   --   --  0.49  --   --   LATICACIDVEN  --  1.8 1.7  --   --   --   --   --   --   --   INR  --   --   --   --  1.5* 1.5*  --   --   --   --   TSH  --   --   --   --   --   --  0.496  --   --   --   HGBA1C  --   --   --   --  9.4*  --   --   --   --   --  MG  --   --   --  1.7 2.2 1.6* 1.8  --   --  1.4*  PHOS  --   --   --  3.0 2.4*  --  2.8  --   --   --   CALCIUM  8.7*  --   --  8.6* 8.2* 8.4* 8.3* 8.5*  --  8.3*      Recent Labs  Lab 01/06/24 1325 01/06/24 2130 01/06/24 2236 01/07/24 0457 01/08/24 0413 01/09/24 0500 01/10/24 0513 01/10/24 1459 01/11/24 0652  CRP  --   --   --   --   --  20.8*  --  16.4* 15.7*  PROCALCITON  --   --   --   --   --   --  0.49  --    --   LATICACIDVEN 1.8 1.7  --   --   --   --   --   --   --   INR  --   --   --  1.5* 1.5*  --   --   --   --   TSH  --   --   --   --   --  0.496  --   --   --   HGBA1C  --   --   --  9.4*  --   --   --   --   --   MG  --   --  1.7 2.2 1.6* 1.8  --   --  1.4*  CALCIUM   --   --  8.6* 8.2* 8.4* 8.3* 8.5*  --  8.3*    --------------------------------------------------------------------------------------------------------------- Lab Results  Component Value Date   CHOL 74 03/06/2016   HDL 27 (L) 03/06/2016   LDLCALC 37 03/06/2016   TRIG 51 03/06/2016   CHOLHDL 2.7 03/06/2016    Lab Results  Component Value Date   HGBA1C 9.4 (H) 01/07/2024   Recent Labs    01/09/24 0500  TSH 0.496   Radiology Reports  No results found.   RADIOLOGY STUDIES/RESULTS: No results found.     LOS: 5 days   Signature  -    Lynnwood Sauer M.D on 01/11/2024 at 10:01 AM   -  To page go to [email or web address]

## 2024-01-12 ENCOUNTER — Other Ambulatory Visit: Payer: Self-pay

## 2024-01-12 DIAGNOSIS — G062 Extradural and subdural abscess, unspecified: Secondary | ICD-10-CM | POA: Diagnosis not present

## 2024-01-12 LAB — CBC WITH DIFFERENTIAL/PLATELET
Abs Immature Granulocytes: 0.23 10*3/uL — ABNORMAL HIGH (ref 0.00–0.07)
Basophils Absolute: 0 10*3/uL (ref 0.0–0.1)
Basophils Relative: 0 %
Eosinophils Absolute: 0.1 10*3/uL (ref 0.0–0.5)
Eosinophils Relative: 1 %
HCT: 31.3 % — ABNORMAL LOW (ref 39.0–52.0)
Hemoglobin: 10.3 g/dL — ABNORMAL LOW (ref 13.0–17.0)
Immature Granulocytes: 2 %
Lymphocytes Relative: 9 %
Lymphs Abs: 1.3 10*3/uL (ref 0.7–4.0)
MCH: 30.3 pg (ref 26.0–34.0)
MCHC: 32.9 g/dL (ref 30.0–36.0)
MCV: 92.1 fL (ref 80.0–100.0)
Monocytes Absolute: 0.9 10*3/uL (ref 0.1–1.0)
Monocytes Relative: 6 %
Neutro Abs: 11.8 10*3/uL — ABNORMAL HIGH (ref 1.7–7.7)
Neutrophils Relative %: 82 %
Platelets: 546 10*3/uL — ABNORMAL HIGH (ref 150–400)
RBC: 3.4 MIL/uL — ABNORMAL LOW (ref 4.22–5.81)
RDW: 15.2 % (ref 11.5–15.5)
WBC: 14.3 10*3/uL — ABNORMAL HIGH (ref 4.0–10.5)
nRBC: 0 % (ref 0.0–0.2)

## 2024-01-12 LAB — GLUCOSE, CAPILLARY
Glucose-Capillary: 122 mg/dL — ABNORMAL HIGH (ref 70–99)
Glucose-Capillary: 98 mg/dL (ref 70–99)
Glucose-Capillary: 94 mg/dL (ref 70–99)
Glucose-Capillary: 90 mg/dL (ref 70–99)

## 2024-01-12 LAB — COMPREHENSIVE METABOLIC PANEL WITH GFR
ALT: 71 U/L — ABNORMAL HIGH (ref 0–44)
AST: 86 U/L — ABNORMAL HIGH (ref 15–41)
Albumin: 1.5 g/dL — ABNORMAL LOW (ref 3.5–5.0)
Alkaline Phosphatase: 213 U/L — ABNORMAL HIGH (ref 38–126)
Anion gap: 9 (ref 5–15)
BUN: 9 mg/dL (ref 6–20)
CO2: 27 mmol/L (ref 22–32)
Calcium: 8.1 mg/dL — ABNORMAL LOW (ref 8.9–10.3)
Chloride: 93 mmol/L — ABNORMAL LOW (ref 98–111)
Creatinine, Ser: 0.46 mg/dL — ABNORMAL LOW (ref 0.61–1.24)
GFR, Estimated: >60 mL/min (ref >60)
Glucose, Bld: 114 mg/dL — ABNORMAL HIGH (ref 70–99)
Potassium: 3.4 mmol/L — ABNORMAL LOW (ref 3.5–5.1)
Sodium: 129 mmol/L — ABNORMAL LOW (ref 135–145)
Total Bilirubin: 0.7 mg/dL (ref 0.0–1.2)
Total Protein: 6 g/dL — ABNORMAL LOW (ref 6.5–8.1)

## 2024-01-12 LAB — C-REACTIVE PROTEIN: CRP: 14 mg/dL — ABNORMAL HIGH (ref <1.0)

## 2024-01-12 LAB — PROCALCITONIN: Procalcitonin: 0.1 ng/mL

## 2024-01-12 LAB — MAGNESIUM: Magnesium: 1.6 mg/dL — ABNORMAL LOW (ref 1.7–2.4)

## 2024-01-12 MED ORDER — POTASSIUM CHLORIDE CRYS ER 20 MEQ PO TBCR
40.0000 meq | EXTENDED_RELEASE_TABLET | Freq: Once | ORAL | Status: AC
Start: 2024-01-12 — End: 2024-01-12
  Administered 2024-01-12: 40 meq via ORAL
  Filled 2024-01-12: qty 2

## 2024-01-12 MED ORDER — MAGNESIUM SULFATE 4 GM/100ML IV SOLN
4.0000 g | Freq: Once | INTRAVENOUS | Status: AC
  Administered 2024-01-12: 4 g via INTRAVENOUS
  Filled 2024-01-12: qty 100

## 2024-01-12 MED ORDER — FUROSEMIDE 10 MG/ML IJ SOLN
20.0000 mg | Freq: Once | INTRAMUSCULAR | Status: AC
Start: 2024-01-12 — End: 2024-01-12
  Administered 2024-01-12: 20 mg via INTRAVENOUS
  Filled 2024-01-12: qty 2

## 2024-01-12 MED ORDER — INSULIN ASPART 100 UNIT/ML IJ SOLN: 0.0000 [IU] | Freq: Every day | INTRAMUSCULAR | Status: DC

## 2024-01-12 MED ORDER — POTASSIUM CHLORIDE CRYS ER 20 MEQ PO TBCR
40.0000 meq | EXTENDED_RELEASE_TABLET | Freq: Once | ORAL | Status: AC
  Administered 2024-01-12: 40 meq via ORAL
  Filled 2024-01-12: qty 2

## 2024-01-12 NOTE — Progress Notes (Signed)
 PROGRESS NOTE        PATIENT DETAILS Name: Brandon [name] Age: 58 y.o. Sex: male Date of Birth: 12-08-1965 Admit Date: 01/06/2024 Admitting Physician Arnulfo Larch, MD ZOX:[id number], [place] Kugel, FNP  Brief Summary: Patient is a 58 y.o.  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  Significant events: 4/15>> low back pain-emphysematous cystitis/gas in pelvis-admit to [facility]. 4/16>> MRI T-spine/L-spine with epidural abscess from T10-11 to L5-S1.  Significant studies: 4/15>> CT abdomen/pelvis: Extensive emphysematous cystitis-with mild extravesical extension of gas along both pelvic sidewalls, scattered abnormal gas in the spinal canal and from T12-L5 level. 4/16>> MRI C-spine: No evidence of discitis/abscess 4/16>> MRI thoracic/lumbar spine: Epidural abscess spanning T10-11 to L5-S1, disc signal at L4-5 and L5-S1.  Lower cord/cauda equina is diffusely compressed by collection.  Significant microbiology data: 4/15>> blood culture: Strep pneumo/ESBL Klebsiella pneumoniae 4/17>> blood culture: No growth.  Procedures:  Echocardiogram -  1. Left ventricular ejection fraction, by estimation, is 65 to 70%. The left ventricle has normal function. There is mild left ventricular hypertrophy. Left ventricular diastolic parameters were normal.  2. Right ventricular systolic function is normal. The right ventricular size is normal.  3. The mitral valve is normal in structure. No evidence of mitral valve regurgitation.  4. AV is thickened, moderately calcified with no significant stenosis.. The  aortic valve is tricuspid. Aortic valve regurgitation is not visualized. Aortic valve sclerosis/calcification is present, without any evidence of aortic stenosis.   CAD-s/p PCI in 2016 No anginal symptoms Since no plans by IR to aspirate epidural abscess-resume Plavix .  History of seizure disorder Keppra   History of chronic pancreatitis Presumably from prior history of EtOH use Continue Creon   BPH Flomax   History of EtOH use Apparently not drinkiNG anymore-SNF resident Will watch for withdrawal symptoms  History of tobacco abuse - counseled to quit  Consults: ID Neurosurgery Cardiology. Urology  Subjective:   Patient in bed, appears comfortable, denies any headache, no fever, no chest pain or pressure, no shortness of breath , no abdominal pain. No new focal weakness.   Objective: Vitals: Blood pressure 128/87, pulse 89, temperature 97.8 F (36.6 C), temperature source Oral, resp. rate 18, SpO2 94%.   Exam:  Frail and cachectic, middle-aged white male, awake Alert, No new F.N deficits, chronic left-sided hemiparesis from previous stroke, condom catheter in place, [place].AT,PERRAL Supple Neck, No JVD,   Symmetrical Chest wall movement, Good air movement bilaterally, CTAB RRR,No Gallops, Rubs or new Murmurs,  +ve B.Sounds, Abd Soft, No tenderness,   No Cyanosis, Clubbing or edema    Assessment/Plan:  Sepsis secondary to polymicrobial bacteremia with streptococci/Klebsiella pneumonia-along with emphysematous cystitis with T10-11 to L5-S1 epidural abscess with resultant cord compression Sepsis physiology gradually improving Uncooperative-but appears to have significant pain/weakness in bilateral lower extremities compared to upper  Has been seen by both IR and neurosurgeon Dr. Ellery Guthrie, not a candidate for any procedure per both IR and neurosurgery. Evaluated by urology-apart from antibiotics - possible Foley catheter placement (patient refused) and outpatient cystoscopy-no  further inpatient recommendations. Continue  meropenem , Repeat blood cultures negative so far TTE done on 4/18-appears nonacute Case discussed with ID, he will get a PICC line with likely 8 weeks of IV antibiotics.  PICC line will be ordered on 01/12/2024.  Postdischarge follow-up with neurosurgery, ID and urology.  SVT with aberrancy Maintaining sinus rhythm Telemetry monitoring Echo pending TSH with a.m. labs Continue metoprolol   Acute metabolic encephalopathy Secondary to sepsis physiology Improved-but easily agitated this morning.  Answer simple questions appropriately.  Hyponatremia.  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  Monitor.    Hypokalemia/hypomagnesemia Repleted  Transaminitis Suspect this is sepsis/shock liver Downtrending-continue to follow-if worsens then will initiate further workup.  History of CVA with significant left-sided hemiparesis Difficult exam but seems to have significant paraparesis/pain issues from cord compression due to epidural abscess Per patient he apparently was using a quad cane-however per neurosurgery-patient has been nonambulatory for several months.  CAD-s/p PCI in 2016 No anginal symptoms Since no plans by IR to aspirate epidural abscess-resume Plavix .  History of seizure disorder Keppra   History of chronic pancreatitis Presumably from prior history of EtOH use Continue Creon   BPH Flomax   History of EtOH use Apparently not drinkiNG anymore-SNF resident Will watch for withdrawal symptoms  History of tobacco abuse - counseled to quit  DM-2 (A1c 9.4 on 4/16) with uncontrolled hyperglycemia CBG running high insulin  adjusted further on 01/11/2024 for better control  CBG (last 3)  Recent Labs    01/11/24 1611 01/11/24 2053 01/12/24 1002  GLUCAP 170* 96 122*    Lab Results  Component Value Date   HGBA1C 9.4  (H) 01/07/2024      Code status:   Code Status: Full Code   DVT Prophylaxis: SCDs Start: 01/06/24 2054   Family Communication: Sister-[name] [phone] left VM 4/17,4/18   Disposition Plan: Status is: Inpatient Remains inpatient appropriate because: Severity of illness   Planned Discharge Destination:Skilled nursing facility   Diet: Diet Order             Diet heart healthy/carb modified Fluid consistency: Thin; Fluid restriction: 1200 mL Fluid  Diet effective now                    MEDICATIONS: Scheduled Meds:  busPIRone   10 mg Oral BID   clopidogrel   75 mg Oral Daily   famotidine   20 mg Oral BID   furosemide   20 mg Intravenous Once   insulin  aspart  0-15 Units Subcutaneous TID WC   insulin  aspart  0-5 Units Subcutaneous QHS   insulin  aspart  4 Units Subcutaneous TID WC   insulin  glargine-yfgn  10 Units Subcutaneous Daily   insulin  glargine-yfgn  30 Units Subcutaneous Daily   levETIRAcetam   500 mg Oral BID   lipase/protease/amylase  36,000 Units Oral TID WC   metoprolol  tartrate  25 mg Oral Q6H   nicotine   14 mg Transdermal Daily   polyethylene glycol  17 g Oral Daily   potassium chloride   40 mEq Oral Once   rosuvastatin   5 mg Oral QHS   senna-docusate  2 tablet Oral QHS   sodium chloride  flush  3 mL Intravenous Q12H   tamsulosin   0.4 mg Oral QPC supper   traZODone   150 mg Oral QHS   Continuous Infusions:  meropenem  (MERREM ) IV 1 g (01/12/24 0554)   PRN Meds:.acetaminophen , albuterol , dextrose , HYDROmorphone  (DILAUDID ) injection, LORazepam , metoprolol  tartrate, oxyCODONE  **OR** oxyCODONE   I have personally reviewed following labs and imaging studies  LABORATORY DATA: Recent Labs  Lab 01/06/24 1214 01/06/24 2236 01/07/24 0457 01/08/24 0413 01/09/24 0500 01/10/24 0513 01/11/24 0652 01/12/24 0455  WBC 25.3* 26.5*   < > 20.2* 17.0* 15.1* 15.5* 14.3*  HGB 11.4* 11.2*   < > 9.6* 9.7* 9.7* 10.5* 10.3*  HCT 35.2* 34.8*   < > 28.6* 29.4*  29.9* 31.2* 31.3*  PLT PLATELET CLUMPS NOTED ON SMEAR, UNABLE TO ESTIMATE 478*   < > 432* 467* 494* 531* 546*  MCV 92.6 93.8   < > 89.9 91.9 92.6 90.7 92.1  MCH 30.0 30.2   < > 30.2 30.3 30.0 30.5 30.3  MCHC 32.4 32.2   < > 33.6 33.0 32.4 33.7 32.9  RDW 14.5 14.7   < > 14.6 15.2 15.4 15.2 15.2  LYMPHSABS 0.8 0.8  --   --   --   --  1.1 1.3  MONOABS 1.2* 0.5  --   --   --   --  0.9 0.9  EOSABS 0.0 0.0  --   --   --   --  0.0 0.1  BASOSABS 0.0 0.0  --   --   --   --  0.0 0.0   < > = values in this interval not displayed.    Recent Labs  Lab 01/06/24 1214 01/06/24 1214 01/06/24 1325 01/06/24 2130 01/06/24 2236 01/07/24 0457 01/08/24 0413 01/09/24 0500 01/10/24 0513 01/10/24 1459 01/11/24 0652 01/12/24 0455  NA 129*  --   --   --  129* 130* 133* 132* 135  --  129* 129*  K 4.1  --   --   --  3.6 3.1* 3.1* 3.9 3.4*  --  4.2 3.4*  CL 92*  --   --   --  93* 95* 96* 98 99  --  96* 93*  CO2 21*  --   --   --  21* 21* 22 16* 25  --  25 27  ANIONGAP 16*  --   --   --  15 14 15  18* 11  --  8 9  GLUCOSE 307*  --   --   --  353* 241* 228* 336* 269*  --  386* 114*  BUN 31*  --   --   --  26* 19 14 12 10   --  11 9  CREATININE 0.78  --   --   --  0.82 0.55* 0.65 0.90 0.64  --  0.53* 0.46*  AST 90*  --   --   --   --   --  185* 52*  --   --  132* 86*  ALT 41  --   --   --   --   --  87* 65*  --   --  91* 71*  ALKPHOS 191*  --   --   --   --   --  205* 224*  --   --  285* 213*  BILITOT 1.2  --   --   --   --   --  0.9 1.5*  --   --  0.7 0.7  ALBUMIN 2.3*  --   --   --   --   --  1.5* 1.6*  --   --  1.5* 1.5*  CRP  --   --   --   --   --   --   --  20.8*  --  16.4* 15.7* 14.0*  PROCALCITON  --   --   --   --   --   --   --   --  0.49  --  0.23 0.10  LATICACIDVEN  --   --  1.8 1.7  --   --   --   --   --   --   --   --   INR  --   --   --   --   --  1.5* 1.5*  --   --   --   --   --   TSH  --   --   --   --   --   --   --  0.496  --   --   --   --   HGBA1C  --   --   --   --   --  9.4*  --    --   --   --   --   --   MG  --    < >  --   --  1.7 2.2 1.6* 1.8  --   --  1.4* 1.6*  PHOS  --   --   --   --  3.0 2.4*  --  2.8  --   --   --   --   CALCIUM  8.7*  --   --   --  8.6* 8.2* 8.4* 8.3* 8.5*  --  8.3* 8.1*   < > = values in this interval not displayed.     Recent Labs  Lab 01/06/24 1325 01/06/24 2130 01/06/24 2236 01/07/24 0457 01/08/24 0413 01/09/24 0500 01/10/24 0513 01/10/24 1459 01/11/24 0652 01/12/24 0455  CRP  --   --   --   --   --  20.8*  --  16.4* 15.7* 14.0*  PROCALCITON  --   --   --   --   --   --  0.49  --  0.23 0.10  LATICACIDVEN 1.8 1.7  --   --   --   --   --   --   --   --   INR  --   --   --  1.5* 1.5*  --   --   --   --   --   TSH  --   --   --   --   --  0.496  --   --   --   --   HGBA1C  --   --   --  9.4*  --   --   --   --   --   --   MG  --   --    < > 2.2 1.6* 1.8  --   --  1.4* 1.6*  CALCIUM   --   --    < > 8.2* 8.4* 8.3* 8.5*  --  8.3* 8.1*   < > = values in this interval not displayed.  ------------------------------------------------------------------------------------------------- Lab Results  Component Value Date   CHOL 74 03/06/2016   HDL 27 (L) 03/06/2016   LDLCALC 37 03/06/2016   TRIG 51 03/06/2016   CHOLHDL 2.7 03/06/2016    Lab Results  Component Value Date   HGBA1C 9.4 (H) 01/07/2024   No results for input(s): "TSH", "T4TOTAL", "FREET4", "T3FREE", "THYROIDAB" in the last 72 hours.  Radiology Reports  US  EKG SITE RITE Result Date: 01/12/2024 If [facility] image not attached, placement  could not be confirmed due to current cardiac rhythm.    RADIOLOGY STUDIES/RESULTS: US  EKG SITE RITE Result Date: 01/12/2024 If Site Rite image not attached, placement could not be confirmed due to current cardiac rhythm.    LOS: 6 days   Signature  -    Lynnwood Sauer M.D on 01/12/2024 at 10:17 AM   -  To page go to [email or web address]

## 2024-01-12 NOTE — Plan of Care (Signed)
  Problem: Education: Goal: Ability to describe self-care measures that may prevent or decrease complications (Diabetes Survival Skills Education) will improve Outcome: Progressing   Problem: Coping: Goal: Ability to adjust to condition or change in health will improve Outcome: Progressing   Problem: Fluid Volume: Goal: Ability to maintain a balanced intake and output will improve Outcome: Progressing   Problem: Education: Goal: Ability to describe self-care measures that may prevent or decrease complications (Diabetes Survival Skills Education) will improve Outcome: Progressing   Problem: Fluid Volume: Goal: Ability to achieve a balanced intake and output will improve Outcome: Progressing   Problem: Urinary Elimination: Goal: Ability to achieve and maintain adequate renal perfusion and functioning will improve Outcome: Progressing

## 2024-01-12 NOTE — Progress Notes (Signed)
 Occupational Therapy Treatment Patient Details Name: Brandon [name] MRN: [id number] DOB: 02-Feb-1966 Today's Date: 01/12/2024   History of present illness 58 y.o. male brought to ED 4/15 from his skilled nursing facility due to worsening lower back pain. 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 PMHx: CVA with residual right-sided hemiplegia, Carotid artery disease s/p L carotid stent, SAH, IPH, On seizure prophylaxis, CAD with PCI/stent RCA, diabetes type 2, hx pancreatitis with pancreatic insufficiency, bilateral renal artery stenosis, hypertension, malnutrition, BPH, GERD, mood disorder.   OT comments  Pt with slower progress towards OT goals d/t self limiting behaviors and intermittent pain reports (primarily legs). Pt declined EOB/OOB attempts, required encouragement to attempt self feeding/grooming tasks with Mod-Max A. Encouraged pt to keep LUE elevated to decrease L hand edema, encouraged L cervical rotation to decrease tightness of neck and assisted to order easier to manage finger foods for lunch to trial today.      If plan is discharge home, recommend the following:  Two people to help with walking and/or transfers;Two people to help with bathing/dressing/bathroom;Assistance with feeding;Direct supervision/assist for medications management;Assist for transportation   Equipment Recommendations  None recommended by OT    Recommendations for Other Services      Precautions / Restrictions Precautions Precautions: Fall Recall of Precautions/Restrictions: Impaired Precaution/Restrictions Comments: Back for comfort Restrictions Weight Bearing Restrictions Per Provider Order: No       Mobility Bed Mobility Overal bed mobility: Needs Assistance             General bed mobility comments: Max A for repositioning in bed, declined EOB attempts    Transfers                   General transfer  comment: declined     Balance                                           ADL either performed or assessed with clinical judgement   ADL Overall ADL's : Needs assistance/impaired Eating/Feeding: Moderate assistance;Bed level Eating/Feeding Details (indicate cue type and reason): assist to scoop applesauce, cued to take in hand with pt able to bring to mouth. pt able to place spoon back in applesauce cup. cues to attempt task. assisted pt to order handheld/finger foods for lunch and preferred foods to encourage PO intake and self feeding attempts Grooming: Maximal assistance;Brushing hair;Bed level Grooming Details (indicate cue type and reason): encouraged to attempt using comb. pt able to comb beard but requested OT assist to comb hair. encouarged to sit up in bed/lift head to allow OT to comb back of hair though pt declined                                    Extremity/Trunk Assessment Upper Extremity Assessment Upper Extremity Assessment: Generalized weakness;Right hand dominant;LUE deficits/detail;RUE deficits/detail RUE Deficits / Details: impaired coordination RUE Coordination: decreased fine motor;decreased gross motor LUE Deficits / Details: Moderate edema in the L hand -  PROM WFL, cued to rest with digits extended LUE Coordination: decreased fine motor;decreased gross motor   Lower Extremity Assessment Lower Extremity Assessment: Defer to PT evaluation        Vision   Vision Assessment?: No apparent visual deficits Additional Comments:  preference for R sided gaze/head turn but able to turn head to L when cued.   Perception     Praxis     Communication Communication Communication: No apparent difficulties   Cognition Arousal: Alert Behavior During Therapy: Flat affect Cognition: No family/caregiver present to determine baseline             OT - Cognition Comments: flat, depressed affect. cues for problem solving and initiation  needed. cues/encouragement to participate                 Following commands: Impaired Following commands impaired: Follows one step commands inconsistently, Follows one step commands with increased time      Cueing   Cueing Techniques: Verbal cues, Tactile cues, Gestural cues  Exercises      Shoulder Instructions       General Comments      Pertinent Vitals/ Pain       Pain Assessment Pain Assessment: Faces Faces Pain Scale: Hurts little more Pain Location: legs Pain Descriptors / Indicators: Grimacing, Guarding Pain Intervention(s): Monitored during session, Limited activity within patient's tolerance  Home Living                                          Prior Functioning/Environment              Frequency  Min 1X/week        Progress Toward Goals  OT Goals(current goals can now be found in the care plan section)  Progress towards OT goals: OT to reassess next treatment  Acute Rehab OT Goals OT Goal Formulation: Patient unable to participate in goal setting Time For Goal Achievement: 01/23/24 Potential to Achieve Goals: Fair ADL Goals Additional ADL Goal #1: Patient will tolerate BUE P/AAROM x 10 reps at all joints with minimal pain/discomfort. Additional ADL Goal #2: Patient will wash face using his RUE/hand with min A in supported sitting  Plan      Co-evaluation                 AM-PAC OT "6 Clicks" Daily Activity     Outcome Measure   Help from another person eating meals?: A Lot Help from another person taking care of personal grooming?: A Lot Help from another person toileting, which includes using toliet, bedpan, or urinal?: Total Help from another person bathing (including washing, rinsing, drying)?: Total Help from another person to put on and taking off regular upper body clothing?: Total Help from another person to put on and taking off regular lower body clothing?: Total 6 Click Score: 8    End of  Session    OT Visit Diagnosis: Other symptoms and signs involving the nervous system (R29.898);Hemiplegia and hemiparesis;Pain   Activity Tolerance     Patient Left     Nurse Communication          Time: 1610-9604 OT Time Calculation (min): 26 min  Charges: OT General Charges $OT Visit: 1 Visit OT Treatments $Self Care/Home Management : 23-37 mins  Lawrence Pretty, OTR/L Acute Rehab Services Office: [phone]   Annabella Barr 01/12/2024, 12:18 PM

## 2024-01-12 NOTE — Progress Notes (Addendum)
 [facility]    Date of Admission:  01/06/2024   Total days of antibiotics 8           ID: Brandon [name] is a 58 y.o. male with MRI thoracic/lumbar spine: Epidural abscess spanning T10-11 to L5-S1, disc signal at L4-5 and L5-S1. Lower cord/cauda equina is diffusely compressed by collection. NSGY did not feel he was a surgical candidate. Hx of ESBL kleb nad ecoli possible source of infection but also found to have strep pneumoniae bacteremia Principal Problem:   Epidural abscess Active Problems:   Wide-complex tachycardia   Diskitis   Uncontrolled type 2 diabetes mellitus with hyperglycemia, with long-term current use of insulin  (HCC)   Emphysematous cystitis   Abnormal findings on diagnostic imaging of spine   Acute pyelonephritis   Paraplegia (HCC)   Sepsis (HCC)   Spinal abscess (HCC)   SVT (supraventricular tachycardia) (HCC)   Infection due to ESBL-producing Klebsiella pneumoniae   Gram-negative bacteremia   Hyperglycemia   Flank pain   Pneumococcal bacteremia   Pneumaturia    Subjective: Afebrile. Has some back discomfort  Medications:   busPIRone   10 mg Oral BID   clopidogrel   75 mg Oral Daily   famotidine   20 mg Oral BID   insulin  aspart  0-15 Units Subcutaneous TID WC   [START ON 01/13/2024] insulin  aspart  0-5 Units Subcutaneous QHS   insulin  glargine-yfgn  30 Units Subcutaneous Daily   levETIRAcetam   500 mg Oral BID   lipase/protease/amylase  36,000 Units Oral TID WC   metoprolol  tartrate  25 mg Oral Q6H   nicotine   14 mg Transdermal Daily   polyethylene glycol  17 g Oral Daily   rosuvastatin   5 mg Oral QHS   senna-docusate  2 tablet Oral QHS   sodium chloride  flush  3 mL Intravenous Q12H   tamsulosin   0.4 mg Oral QPC supper   traZODone   150 mg Oral QHS    Objective: Vital signs in last 24 hours: Temp:  [97.6 F (36.4 C)-98.8 F (37.1 C)] 98.8 F (37.1 C) (04/21 1932) Pulse Rate:  [84-102] 89 (04/21 0540) Resp:   [17-18] 17 (04/21 1932) BP: (97-128)/(78-90) 97/80 (04/21 1932) SpO2:  [94 %-97 %] 97 % (04/21 1932)  Physical Exam  Constitutional: He is oriented to person, place, and time. He appears chronically ill and malnourished. No distress.  HENT:  Mouth/Throat: Oropharynx is clear and moist. No oropharyngeal exudate.  Cardiovascular: Normal rate, regular rhythm and normal heart sounds. Exam reveals no gallop and no friction rub.  No murmur heard.  Pulmonary/Chest: Effort normal and breath sounds normal. No respiratory distress. He has no wheezes.  Abdominal: Soft. Bowel sounds are normal. He exhibits no distension. There is no tenderness.  Lymphadenopathy:  He has no cervical adenopathy.  Neurological: He is alert and oriented to person, place, and time. Slight movement of legs Skin: Skin is warm and dry. No rash noted. No erythema.  Psychiatric: He has a normal mood and affect. His behavior is normal.    Lab Results Recent Labs    01/11/24 0652 01/12/24 0455  WBC 15.5* 14.3*  HGB 10.5* 10.3*  HCT 31.2* 31.3*  NA 129* 129*  K 4.2 3.4*  CL 96* 93*  CO2 25 27  BUN 11 9  CREATININE 0.53* 0.46*   Liver Panel Recent Labs    01/11/24 0652 01/12/24 0455  PROT 6.0* 6.0*  ALBUMIN 1.5* 1.5*  AST 132* 86*  ALT  91* 71*  ALKPHOS 285* 213*  BILITOT 0.7 0.7   Lab Results  Component Value Date   ESRSEDRATE 105 (H) 01/09/2024    C-Reactive Protein Recent Labs    01/11/24 0652 01/12/24 0455  CRP 15.7* 14.0*    Microbiology: Blood cx strep pneumoniae on 4/15 Blood cx NGTD on 4/17 Urine cx esbl kleb and ecoli Studies/Results: US  EKG SITE RITE Result Date: 01/12/2024 If Site Rite image not attached, placement could not be confirmed due to current cardiac rhythm.    Assessment/Plan: MRI thoracic/lumbar spine: Epidural abscess spanning T10-11 to L5-S1, disc signal at L4-5 and L5-S1. Lower cord/cauda equina is diffusely compressed by collection -  Plan to treat for 8 wk, has  finished 1 wk thus far. Plan to get picc line  Diagnosis: Discitis/epidural abscess  Culture Result: esbl kleb and strep pneumonaie  No Known Allergies  OPAT Orders Discharge antibiotics to be given via PICC line Discharge antibiotics: Per pharmacy protocol  -- meropenem  1gm iv q 8hr   Duration: 8 wks End Date: 03/08/24  [facility] Care Per Protocol:  Home health RN for IV administration and teaching; PICC line care and labs.    Labs weekly while on IV antibiotics: _x_ CBC with differential _x_ BMP __ CMP _x_ CRP _x_ ESR   _x_ Please pull PIC at completion of IV antibiotics  Fax weekly labs to [phone]  Clinic Follow Up Appt: 6 wk  @ [facility]    [facility] for Infectious Diseases Pager: [phone]  01/12/2024, 7:57 PM

## 2024-01-12 NOTE — Progress Notes (Signed)
 Physical Therapy Treatment Patient Details Name: HRITHIK BOSCHEE MRN: [id number] DOB: 07-22-1966 Today's Date: 01/12/2024   History of Present Illness 58 y.o. male brought to ED 4/15 from his skilled nursing facility due to worsening lower back pain. 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 PMHx: CVA with residual right-sided hemiplegia, Carotid artery disease s/p L carotid stent, SAH, IPH, On seizure prophylaxis, CAD with PCI/stent RCA, diabetes type 2, hx pancreatitis with pancreatic insufficiency, bilateral renal artery stenosis, hypertension, malnutrition, BPH, GERD, mood disorder.    PT Comments  Spasticity improved compared to last visit but still notable in LLE>RLE. MD approved verbal order for bil prevalon boots (placed 4/21.) Practiced rolling in bed with max assist, multiple step-wise sequencing to bring LEs off bed. Able to raise trunk with bed only to 30 degrees, did not tolerate manual assist from therapist with total support. Able to fully extend LEs in supine. Remains confused, easily distractible, reduced short term memory. Patient will continue to benefit from skilled physical therapy services to further improve independence with functional mobility.    If plan is discharge home, recommend the following: Two people to help with walking and/or transfers;Two people to help with bathing/dressing/bathroom;Assistance with cooking/housework;Supervision due to cognitive status;Assist for transportation;Direct supervision/assist for financial management;Direct supervision/assist for medications management;Assistance with feeding   Can travel by private vehicle     No  Equipment Recommendations  None recommended by PT    Recommendations for Other Services       Precautions / Restrictions Precautions Precautions: Fall Recall of Precautions/Restrictions: Impaired Precaution/Restrictions Comments: Back for  comfort Restrictions Weight Bearing Restrictions Per Provider Order: No     Mobility  Bed Mobility Overal bed mobility: Needs Assistance Bed Mobility: Rolling, Sit to Supine, Sidelying to Sit Rolling: Max assist, Used rails Sidelying to sit: HOB elevated, Total assist   Sit to supine: HOB elevated, Max assist   General bed mobility comments: Max assist to roll towards Rt side, pt able to grasp rail to assist following cues. Required multiple stages but eventually lowered LEs to floor with less spasticity than prior visit. Able to elevated HOB to approx 30 degrees before pt refuse further upright. Attempted to assist manually with trunk support to EOB but increased pain and pt refused. Max assist for LE support back into bed, he did facilitate lifting RLE>LLE back into bed.    Transfers                   General transfer comment: declined    Ambulation/Gait                   Stairs             Wheelchair Mobility     Tilt Bed    Modified Rankin (Stroke Patients Only)       Balance                                            Communication Communication Communication: No apparent difficulties  Cognition Arousal: Alert Behavior During Therapy: Flat affect   PT - Cognitive impairments: No family/caregiver present to determine baseline, Initiation, Sequencing, Problem solving, Safety/Judgement                         Following commands: Impaired  Following commands impaired: Follows one step commands inconsistently, Follows one step commands with increased time    Cueing Cueing Techniques: Verbal cues, Tactile cues, Gestural cues  Exercises Other Exercises Other Exercises: Declined to attempt LAQ on EOB. Other Exercises: Slowly took LEs through range with add/abduction and full extension in supine. Tolerated better today with less spasticity but still present in LLE >RLE    General Comments General comments (skin  integrity, edema, etc.): Less spasticity in LEs today although still present Lt>Rt. Able to fully extend LEs this date.      Pertinent Vitals/Pain Pain Assessment Pain Assessment: Faces Faces Pain Scale: Hurts little more Pain Location: legs Pain Descriptors / Indicators: Grimacing, Guarding Pain Intervention(s): Monitored during session, Repositioned    Home Living                          Prior Function            PT Goals (current goals can now be found in the care plan section) Acute Rehab PT Goals Patient Stated Goal: Go outside to smoke a cigarette PT Goal Formulation: With patient Time For Goal Achievement: 01/22/24 Potential to Achieve Goals: Poor Progress towards PT goals: Progressing toward goals    Frequency    Min 1X/week      PT Plan      Co-evaluation              AM-PAC PT "6 Clicks" Mobility   Outcome Measure  Help needed turning from your back to your side while in a flat bed without using bedrails?: A Lot Help needed moving from lying on your back to sitting on the side of a flat bed without using bedrails?: Total Help needed moving to and from a bed to a chair (including a wheelchair)?: Total Help needed standing up from a chair using your arms (e.g., wheelchair or bedside chair)?: Total Help needed to walk in hospital room?: Total Help needed climbing 3-5 steps with a railing? : Total 6 Click Score: 7    End of Session   Activity Tolerance: Other (comment);Patient limited by pain (Self limited, confusion.) Patient left: in bed;with call bell/phone within reach;with bed alarm set (heels floating) Nurse Communication: Mobility status (recommended prevalon boots bil; MD provided verbal order, placed.) PT Visit Diagnosis: Muscle weakness (generalized) (M62.81);Difficulty in walking, not elsewhere classified (R26.2);Other symptoms and signs involving the nervous system (R29.898);Pain Pain - Right/Left:  (bil) Pain - part of body:   (back and BIL LEs)     Time: 1645-1710 PT Time Calculation (min) (ACUTE ONLY): 25 min  Charges:    $Therapeutic Activity: 23-37 mins PT General Charges $$ ACUTE PT VISIT: 1 Visit                     Jory Ng, PT, DPT [facility]  Rehabilitation Services Physical Therapist Office: [phone] Website: [email or web address]    Alinda Irani 01/12/2024, 5:40 PM

## 2024-01-12 NOTE — Progress Notes (Addendum)
 PHARMACY CONSULT NOTE FOR:  OUTPATIENT  PARENTERAL ANTIBIOTIC THERAPY (OPAT)  Indication: Discitis/epidural abscess Regimen: Meropenem  1g IV every 8 hours End date: 03/08/24   IV antibiotic discharge orders are pended. To discharging provider:  please sign these orders via discharge navigator,  Select New Orders & click on the button choice - Manage This Unsigned Work.     Thank you for allowing pharmacy to be a part of this patient's care.  Garland Junk, PharmD, BCPS, BCIDP Infectious Diseases Clinical Pharmacist 01/12/2024 12:49 PM   **Pharmacist phone directory can now be found on [email or web address] ([email or web address]).  Listed under [facility].

## 2024-01-13 ENCOUNTER — Inpatient Hospital Stay (HOSPITAL_COMMUNITY)

## 2024-01-13 DIAGNOSIS — R7881 Bacteremia: Secondary | ICD-10-CM

## 2024-01-13 DIAGNOSIS — B953 Streptococcus pneumoniae as the cause of diseases classified elsewhere: Secondary | ICD-10-CM | POA: Diagnosis not present

## 2024-01-13 DIAGNOSIS — G061 Intraspinal abscess and granuloma: Secondary | ICD-10-CM

## 2024-01-13 DIAGNOSIS — Z1612 Extended spectrum beta lactamase (ESBL) resistance: Secondary | ICD-10-CM | POA: Diagnosis not present

## 2024-01-13 DIAGNOSIS — B961 Klebsiella pneumoniae [K. pneumoniae] as the cause of diseases classified elsewhere: Secondary | ICD-10-CM

## 2024-01-13 DIAGNOSIS — G062 Extradural and subdural abscess, unspecified: Secondary | ICD-10-CM | POA: Diagnosis not present

## 2024-01-13 LAB — SODIUM, URINE, RANDOM: Sodium, Ur: 71 mmol/L

## 2024-01-13 LAB — GLUCOSE, CAPILLARY
Glucose-Capillary: 72 mg/dL (ref 70–99)
Glucose-Capillary: 58 mg/dL — ABNORMAL LOW (ref 70–99)
Glucose-Capillary: 81 mg/dL (ref 70–99)
Glucose-Capillary: 95 mg/dL (ref 70–99)

## 2024-01-13 LAB — CBC WITH DIFFERENTIAL/PLATELET
Abs Immature Granulocytes: 0.23 10*3/uL — ABNORMAL HIGH (ref 0.00–0.07)
Basophils Absolute: 0 10*3/uL (ref 0.0–0.1)
Basophils Relative: 0 %
Eosinophils Absolute: 0 10*3/uL (ref 0.0–0.5)
Eosinophils Relative: 0 %
HCT: 27.7 % — ABNORMAL LOW (ref 39.0–52.0)
Hemoglobin: 9.4 g/dL — ABNORMAL LOW (ref 13.0–17.0)
Immature Granulocytes: 2 %
Lymphocytes Relative: 7 %
Lymphs Abs: 1.1 10*3/uL (ref 0.7–4.0)
MCH: 30.5 pg (ref 26.0–34.0)
MCHC: 33.9 g/dL (ref 30.0–36.0)
MCV: 89.9 fL (ref 80.0–100.0)
Monocytes Absolute: 0.8 10*3/uL (ref 0.1–1.0)
Monocytes Relative: 5 %
Neutro Abs: 13.5 10*3/uL — ABNORMAL HIGH (ref 1.7–7.7)
Neutrophils Relative %: 86 %
Platelets: 578 10*3/uL — ABNORMAL HIGH (ref 150–400)
RBC: 3.08 MIL/uL — ABNORMAL LOW (ref 4.22–5.81)
RDW: 15.2 % (ref 11.5–15.5)
WBC: 15.7 10*3/uL — ABNORMAL HIGH (ref 4.0–10.5)
nRBC: 0 % (ref 0.0–0.2)

## 2024-01-13 LAB — CREATININE, URINE, RANDOM: Creatinine, Urine: 43 mg/dL

## 2024-01-13 LAB — COMPREHENSIVE METABOLIC PANEL WITH GFR
ALT: 78 U/L — ABNORMAL HIGH (ref 0–44)
AST: 112 U/L — ABNORMAL HIGH (ref 15–41)
Albumin: <1.5 g/dL — ABNORMAL LOW (ref 3.5–5.0)
Alkaline Phosphatase: 242 U/L — ABNORMAL HIGH (ref 38–126)
Anion gap: 8 (ref 5–15)
BUN: 9 mg/dL (ref 6–20)
CO2: 25 mmol/L (ref 22–32)
Calcium: 7.9 mg/dL — ABNORMAL LOW (ref 8.9–10.3)
Chloride: 94 mmol/L — ABNORMAL LOW (ref 98–111)
Creatinine, Ser: 0.56 mg/dL — ABNORMAL LOW (ref 0.61–1.24)
GFR, Estimated: >60 mL/min (ref >60)
Glucose, Bld: 91 mg/dL (ref 70–99)
Potassium: 4.3 mmol/L (ref 3.5–5.1)
Sodium: 127 mmol/L — ABNORMAL LOW (ref 135–145)
Total Bilirubin: 0.7 mg/dL (ref 0.0–1.2)
Total Protein: 5.8 g/dL — ABNORMAL LOW (ref 6.5–8.1)

## 2024-01-13 LAB — CULTURE, BLOOD (ROUTINE X 2)
Culture: NO GROWTH
Culture: NO GROWTH
Special Requests: ADEQUATE
Special Requests: ADEQUATE

## 2024-01-13 LAB — MAGNESIUM: Magnesium: 1.8 mg/dL (ref 1.7–2.4)

## 2024-01-13 LAB — PROCALCITONIN: Procalcitonin: 0.1 ng/mL

## 2024-01-13 LAB — OSMOLALITY, URINE: Osmolality, Ur: 417 mosm/kg (ref 300–900)

## 2024-01-13 LAB — OSMOLALITY: Osmolality: 270 mosm/kg — ABNORMAL LOW (ref 275–295)

## 2024-01-13 LAB — URIC ACID: Uric Acid, Serum: 2 mg/dL — ABNORMAL LOW (ref 3.7–8.6)

## 2024-01-13 LAB — C-REACTIVE PROTEIN: CRP: 12.9 mg/dL — ABNORMAL HIGH (ref <1.0)

## 2024-01-13 MED ORDER — METOPROLOL TARTRATE 25 MG PO TABS
25.0000 mg | ORAL_TABLET | Freq: Once | ORAL | Status: AC
  Administered 2024-01-13: 25 mg via ORAL
  Filled 2024-01-13: qty 1

## 2024-01-13 MED ORDER — SODIUM CHLORIDE 0.9% FLUSH: 10.0000 mL | INTRAVENOUS | Status: DC | PRN

## 2024-01-13 MED ORDER — METOPROLOL TARTRATE 50 MG PO TABS
50.0000 mg | ORAL_TABLET | Freq: Two times a day (BID) | ORAL | Status: DC
  Administered 2024-01-14 – 2024-01-15 (×3): 50 mg via ORAL
  Filled 2024-01-13 (×5): qty 1

## 2024-01-13 MED ORDER — TOLVAPTAN 15 MG PO TABS
15.0000 mg | ORAL_TABLET | Freq: Once | ORAL | Status: AC
  Administered 2024-01-13: 15 mg via ORAL
  Filled 2024-01-13: qty 1

## 2024-01-13 MED ORDER — CHLORHEXIDINE GLUCONATE CLOTH 2 % EX PADS
6.0000 | MEDICATED_PAD | Freq: Every day | CUTANEOUS | Status: DC
  Administered 2024-01-13 – 2024-01-21 (×9): 6 via TOPICAL

## 2024-01-13 NOTE — Progress Notes (Signed)
 [facility] night cross cover note:   I was notified by RN that after approximately 10 minutes, MRI was discontinued per patient's request.      Camelia Cavalier, DO Hospitalist

## 2024-01-13 NOTE — Plan of Care (Signed)
  Problem: Education: Goal: Ability to describe self-care measures that may prevent or decrease complications (Diabetes Survival Skills Education) will improve Outcome: Progressing   Problem: Skin Integrity: Goal: Risk for impaired skin integrity will decrease Outcome: Progressing   Problem: Nutritional: Goal: Maintenance of adequate nutrition will improve Outcome: Progressing   Problem: Pain Managment: Goal: General experience of comfort will improve and/or be controlled Outcome: Progressing   Problem: Safety: Goal: Ability to remain free from injury will improve Outcome: Progressing

## 2024-01-13 NOTE — Progress Notes (Signed)
 Peripherally Inserted Central Catheter Placement  The IV Nurse has discussed with the patient and/or persons authorized to consent for the patient, the purpose of this procedure and the potential benefits and risks involved with this procedure.  The benefits include less needle sticks, lab draws from the catheter, and the patient may be discharged home with the catheter. Risks include, but not limited to, infection, bleeding, blood clot (thrombus formation), and puncture of an artery; nerve damage and irregular heartbeat and possibility to perform a PICC exchange if needed/ordered by physician.  Alternatives to this procedure were also discussed.  Bard Power PICC patient education guide, fact sheet on infection prevention and patient information card has been provided to patient /or left at bedside.    PICC Placement Documentation  PICC Single Lumen 01/13/24 Right Basilic 39 cm 0 cm (Active)  Indication for Insertion or Continuance of Line Prolonged intravenous therapies 01/13/24 1646  Exposed Catheter (cm) 0 cm 01/13/24 1646  Site Assessment Clean, Dry, Intact 01/13/24 1646  Line Status Flushed;Saline locked;Blood return noted 01/13/24 1646  Dressing Type Transparent;Securing device 01/13/24 1646  Dressing Status Antimicrobial disc/dressing in place;Clean, Dry, Intact 01/13/24 1646  Line Care Connections checked and tightened 01/13/24 1646  Line Adjustment (NICU/IV Team Only) No 01/13/24 1646  Dressing Intervention New dressing;Adhesive placed at insertion site (IV team only) 01/13/24 1646  Dressing Change Due 01/20/24 01/13/24 1646    Telephone consent signed by sister    Unk Garb 01/13/2024, 4:47 PM

## 2024-01-13 NOTE — Plan of Care (Signed)
  Problem: Fluid Volume: Goal: Ability to maintain a balanced intake and output will improve Outcome: Progressing   Problem: Nutritional: Goal: Maintenance of adequate nutrition will improve Outcome: Progressing Goal: Progress toward achieving an optimal weight will improve Outcome: Progressing   Problem: Skin Integrity: Goal: Risk for impaired skin integrity will decrease Outcome: Progressing   Problem: Tissue Perfusion: Goal: Adequacy of tissue perfusion will improve Outcome: Progressing   Problem: Health Behavior/Discharge Planning: Goal: Ability to manage health-related needs will improve Outcome: Progressing   Problem: Fluid Volume: Goal: Ability to achieve a balanced intake and output will improve Outcome: Progressing   Problem: Nutritional: Goal: Maintenance of adequate nutrition will improve Outcome: Progressing   Problem: Education: Goal: Knowledge of General Education information will improve Description: Including pain rating scale, medication(s)/side effects and non-pharmacologic comfort measures Outcome: Progressing   Problem: Clinical Measurements: Goal: Will remain free from infection Outcome: Progressing

## 2024-01-13 NOTE — Progress Notes (Addendum)
 PROGRESS NOTE        PATIENT DETAILS Name: Brandon [name] Age: 58 y.o. Sex: male Date of Birth: 10-Jun-1966 Admit Date: 01/06/2024 Admitting Physician Arnulfo Larch, MD ZOX:[id number], [place] Kugel, FNP  Brief Summary: Patient is a 58 y.o.  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  Significant events: 4/15>> low back pain-emphysematous cystitis/gas in pelvis-admit to [facility]. 4/16>> MRI T-spine/L-spine with epidural abscess from T10-11 to L5-S1.  Significant studies: 4/15>> CT abdomen/pelvis: Extensive emphysematous cystitis-with mild extravesical extension of gas along both pelvic sidewalls, scattered abnormal gas in the spinal canal and from T12-L5 level. 4/16>> MRI C-spine: No evidence of discitis/abscess 4/16>> MRI thoracic/lumbar spine: Epidural abscess spanning T10-11 to L5-S1, disc signal at L4-5 and L5-S1.  Lower cord/cauda equina is diffusely compressed by collection.  Significant microbiology data: 4/15>> blood culture: Strep pneumo/ESBL Klebsiella pneumoniae 4/17>> blood culture: No growth.  Procedures:  Echocardiogram -  1. Left ventricular ejection fraction, by estimation, is 65 to 70%. The left ventricle has normal function. There is mild left ventricular hypertrophy. Left ventricular diastolic parameters were normal.  2. Right ventricular systolic function is normal. The right ventricular size is normal.  3. The mitral valve is normal in structure. No evidence of mitral valve regurgitation.  4. AV is thickened, moderately calcified with no significant stenosis.. The  aortic valve is tricuspid. Aortic valve regurgitation is not visualized. Aortic valve sclerosis/calcification is present, without any evidence of aortic stenosis.      Consults: ID Neurosurgery Cardiology. Urology  Subjective:  Patient in bed, appears comfortable, denies any headache, no fever, no chest pain or pressure, no shortness of breath , no abdominal pain. No new focal weakness.   Objective: Vitals: Blood pressure (!) 143/101, pulse (!) 107, temperature 98.5 F (36.9 C), temperature source Oral, resp. rate 15, SpO2 96%.   Exam:  Frail and cachectic, middle-aged white male, awake Alert, No new F.N deficits, chronic left-sided hemiparesis from previous stroke, condom catheter in place, [place].AT,PERRAL Supple Neck, No JVD,   Symmetrical Chest wall movement, Good air movement bilaterally, CTAB RRR,No Gallops, Rubs or new Murmurs,  +ve B.Sounds, Abd Soft, No tenderness,   No Cyanosis, Clubbing or edema    Assessment/Plan:  Sepsis secondary to polymicrobial bacteremia with streptococci/Klebsiella pneumonia-along with emphysematous cystitis with T10-11 to L5-S1 epidural abscess with resultant cord compression Sepsis physiology gradually improving Uncooperative-but appears to have significant pain/weakness in bilateral lower extremities compared to upper  Has been seen by both IR and neurosurgeon Dr. Ellery Guthrie, not a candidate for any procedure per both IR and neurosurgery. Evaluated by urology-apart from antibiotics - possible Foley catheter placement (patient refused) and outpatient cystoscopy-no further inpatient recommendations. Continue meropenem , Repeat blood cultures negative so far TTE done on 4/18-appears nonacute Case discussed with ID, he will get a PICC line with 8 weeks of IV Meropenam stop date 03/04/24 .  PICC line will be ordered on 01/12/2024.  Postdischarge follow-up with neurosurgery, ID and urology.  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  Answer simple questions appropriately.  Hyponatremia.  SIADH, failed fluid restriction and Lasix , trial of Samsca  on 01/13/2024  Hypokalemia/hypomagnesemia Repleted  Transaminitis Suspect this is sepsis/shock liver Downtrending-continue to follow-if worsens then will initiate further workup.  History of CVA with significant left-sided hemiparesis Difficult exam but seems to have significant paraparesis/pain issues from cord compression due to epidural abscess Per patient he apparently was using a quad cane-however per neurosurgery-patient has been nonambulatory for several months.  CAD-s/p PCI in 2016 No anginal symptoms Since no plans by IR to aspirate epidural abscess-resume Plavix .  HTN - B. Blocker adjusted further on 01/13/24  History of seizure disorder Keppra   History of chronic pancreatitis Presumably from prior history of EtOH use Continue Creon   BPH Flomax   History of EtOH use Apparently not drinkiNG anymore-SNF resident Will watch for withdrawal symptoms  History of tobacco abuse - counseled to quit  DM-2 (A1c 9.4 on 4/16) with uncontrolled hyperglycemia CBG running high insulin  adjusted further on 01/12/2024 for better control  CBG (last 3)  Recent Labs    01/12/24 1214 01/12/24 1740 01/12/24 2150  GLUCAP 98 94 90    Lab Results  Component Value Date   HGBA1C 9.4 (H) 01/07/2024      Code status:   Code Status: Full Code   DVT Prophylaxis: SCDs Start: 01/06/24 2054   Family Communication: Sister-[name] [phone] left VM 4/17,4/18   Disposition Plan: Status is: Inpatient Remains inpatient appropriate because: Severity of illness   Planned Discharge Destination:Skilled nursing facility   Diet: Diet Order             Diet heart healthy/carb modified Fluid consistency: Thin  Diet  effective now                    MEDICATIONS: Scheduled Meds:  busPIRone   10 mg Oral BID   clopidogrel   75 mg Oral Daily   famotidine   20 mg Oral BID   insulin  aspart  0-15 Units Subcutaneous TID WC   insulin  aspart  0-5 Units Subcutaneous QHS   insulin  glargine-yfgn  30 Units Subcutaneous Daily   levETIRAcetam   500 mg Oral BID   lipase/protease/amylase  36,000 Units Oral TID WC   metoprolol  tartrate  25 mg Oral Q6H   nicotine   14 mg Transdermal Daily   polyethylene glycol  17 g Oral Daily   rosuvastatin   5 mg Oral QHS   senna-docusate  2 tablet Oral QHS   sodium chloride  flush  3 mL Intravenous Q12H   tamsulosin   0.4 mg Oral QPC supper   tolvaptan   15 mg Oral Once   traZODone   150 mg Oral QHS   Continuous Infusions:  meropenem  (MERREM ) IV 1 g (01/13/24 0548)   PRN Meds:.acetaminophen , albuterol , dextrose , HYDROmorphone  (DILAUDID ) injection, LORazepam , metoprolol  tartrate, oxyCODONE  **OR** oxyCODONE    I have personally reviewed following labs and imaging studies  LABORATORY DATA: Recent Labs  Lab 01/06/24 1214 01/06/24 2236 01/07/24 0457 01/09/24 0500 01/10/24 0513 01/11/24 0652 01/12/24 0455 01/13/24 0356  WBC 25.3* 26.5*   < > 17.0* 15.1* 15.5* 14.3* 15.7*  HGB 11.4* 11.2*   < > 9.7* 9.7* 10.5* 10.3* 9.4*  HCT 35.2* 34.8*   < > 29.4* 29.9* 31.2* 31.3* 27.7*  PLT PLATELET CLUMPS NOTED ON SMEAR, UNABLE TO ESTIMATE 478*   < > 467* 494* 531* 546* 578*  MCV 92.6 93.8   < > 91.9  92.6 90.7 92.1 89.9  MCH 30.0 30.2   < > 30.3 30.0 30.5 30.3 30.5  MCHC 32.4 32.2   < > 33.0 32.4 33.7 32.9 33.9  RDW 14.5 14.7   < > 15.2 15.4 15.2 15.2 15.2  LYMPHSABS 0.8 0.8  --   --   --  1.1 1.3 1.1  MONOABS 1.2* 0.5  --   --   --  0.9 0.9 0.8  EOSABS 0.0 0.0  --   --   --  0.0 0.1 0.0  BASOSABS 0.0 0.0  --   --   --  0.0 0.0 0.0   < > = values in this interval not displayed.    Recent Labs  Lab 01/06/24 1214 01/06/24 1325 01/06/24 2130 01/06/24 2236 01/07/24 0457  01/08/24 0413 01/09/24 0500 01/10/24 0513 01/10/24 1459 01/11/24 0652 01/12/24 0455 01/13/24 0356  NA  --   --   --  129* 130* 133* 132* 135  --  129* 129* 127*  K  --   --   --  3.6 3.1* 3.1* 3.9 3.4*  --  4.2 3.4* 4.3  CL  --   --   --  93* 95* 96* 98 99  --  96* 93* 94*  CO2  --   --   --  21* 21* 22 16* 25  --  25 27 25   ANIONGAP  --   --   --  15 14 15  18* 11  --  8 9 8   GLUCOSE  --   --   --  353* 241* 228* 336* 269*  --  386* 114* 91  BUN  --   --   --  26* 19 14 12 10   --  11 9 9   CREATININE  --   --   --  0.82 0.55* 0.65 0.90 0.64  --  0.53* 0.46* 0.56*  AST  --   --   --   --   --  185* 52*  --   --  132* 86* 112*  ALT  --   --   --   --   --  87* 65*  --   --  91* 71* 78*  ALKPHOS  --   --   --   --   --  205* 224*  --   --  285* 213* 242*  BILITOT  --   --   --   --   --  0.9 1.5*  --   --  0.7 0.7 0.7  ALBUMIN  --   --   --   --   --  1.5* 1.6*  --   --  1.5* 1.5* <1.5*  CRP  --   --   --   --   --   --  20.8*  --  16.4* 15.7* 14.0* 12.9*  PROCALCITON  --   --   --   --   --   --   --  0.49  --  0.23 0.10 0.10  LATICACIDVEN  --  1.8 1.7  --   --   --   --   --   --   --   --   --   INR  --   --   --   --  1.5* 1.5*  --   --   --   --   --   --   TSH  --   --   --   --   --   --  0.496  --   --   --   --   --   HGBA1C  --   --   --   --  9.4*  --   --   --   --   --   --   --   MG   < >  --   --  1.7 2.2 1.6* 1.8  --   --  1.4* 1.6* 1.8  PHOS  --   --   --  3.0 2.4*  --  2.8  --   --   --   --   --   CALCIUM   --   --   --  8.6* 8.2* 8.4* 8.3* 8.5*  --  8.3* 8.1* 7.9*   < > = values in this interval not displayed.     Recent Labs  Lab 01/06/24 1325 01/06/24 2130 01/06/24 2236 01/07/24 0457 01/08/24 0413 01/09/24 0500 01/10/24 0513 01/10/24 1459 01/11/24 0652 01/12/24 0455 01/13/24 0356  CRP  --   --   --   --   --  20.8*  --  16.4* 15.7* 14.0* 12.9*  PROCALCITON  --   --   --   --   --   --  0.49  --  0.23 0.10 0.10  LATICACIDVEN 1.8 1.7  --   --   --   --    --   --   --   --   --   INR  --   --   --  1.5* 1.5*  --   --   --   --   --   --   TSH  --   --   --   --   --  0.496  --   --   --   --   --   HGBA1C  --   --   --  9.4*  --   --   --   --   --   --   --   MG  --   --    < > 2.2 1.6* 1.8  --   --  1.4* 1.6* 1.8  CALCIUM   --   --    < > 8.2* 8.4* 8.3* 8.5*  --  8.3* 8.1* 7.9*   < > = values in this interval not displayed.  ------------------------------------------------------------------------------------------------- Lab Results  Component Value Date   CHOL 74 03/06/2016   HDL 27 (L) 03/06/2016   LDLCALC 37 03/06/2016   TRIG 51 03/06/2016   CHOLHDL 2.7 03/06/2016    Lab Results  Component Value Date   HGBA1C 9.4 (H) 01/07/2024   No results for input(s): "TSH", "T4TOTAL", "FREET4", "T3FREE", "THYROIDAB" in the last 72 hours.  Radiology Reports  US  EKG SITE RITE Result Date: 01/12/2024 If Site Rite image not attached, placement could not be confirmed due to current cardiac rhythm.    RADIOLOGY STUDIES/RESULTS: US  EKG SITE RITE Result Date: 01/12/2024 If Site Rite image not attached, placement could not be confirmed due to current cardiac rhythm.    LOS: 7 days   Signature  -    Lynnwood Sauer M.D on 01/13/2024 at 8:46 AM   -  To page go to [email or web address]

## 2024-01-13 NOTE — Progress Notes (Signed)
 Patient ID: Brandon [name], male   DOB: 02-Sep-1966, 58 y.o.   MRN: [id number] Patient's condition remains unchanged.  Less painful in the left side though movement has not changed.  I believe a a repeat MRI may be of benefit to see if there is improvement in the clinical status of his epidural abscess.

## 2024-01-13 NOTE — Progress Notes (Signed)
 [facility]  Date of Admission:  01/06/2024      Total days of antibiotics 7   Meropenem            ASSESSMENT: Brandon [name] is a 58 y.o. 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 Lower cord/cauda equina is diffusely compressed by collection . No surgery recommended at this time.  NSGY following and recommended repeat MRI which has been ordered  CRP is trending down.  - Plan to treat for 8 wk  Vascular Access -  -PICC ordered - we discussed the necessity of this placement to ensure he gets his antibiotic treatment for the spinal infection. Explained we don't have a safe pill option that we could offer. After time spent in discussion he was amenable to placement. Orders placed.  -Discharging with SNF to maintain PICC line care and education for patient described below   Discharge Planning / Coordination of Care -  -Outpatient antibiotics set -Discussed with Kay Parson, ID pharmacy and primary   Medication Monitoring -  -Safety labs ordered and detailed below to be followed in OPAT clinic   Will chart check tomorrow to reassess NSGY take on MRI that has been ordered   PLAN: Continue OPAT plan    Diagnosis: Discitis/epidural abscess   Culture Result: esbl kleb and strep pneumonaie   Allergies  No Known Allergies     OPAT Orders Discharge antibiotics to be given via PICC line Discharge antibiotics: Per pharmacy protocol  -- meropenem  1gm iv q 8hr    Duration: 8 wks  End Date: 03/08/24   [facility] Care Per Protocol:   Home health RN for IV administration and teaching; PICC line care and labs.     Labs weekly while on IV antibiotics: _x_ CBC with differential _x_ BMP __ CMP _x_ CRP _x_ ESR     _x_ Please pull PIC at completion of IV  antibiotics   Fax weekly labs to [phone]   Clinic Follow Up Appt: 03/08/24 @ 9:30 am    Principal Problem:   Epidural abscess Active Problems:   Wide-complex tachycardia   Diskitis   Uncontrolled type 2 diabetes mellitus with hyperglycemia, with long-term current use of insulin  (HCC)   Emphysematous cystitis   Abnormal findings on diagnostic imaging of spine   Acute pyelonephritis   Paraplegia (HCC)   Sepsis (HCC)   Spinal abscess (HCC)   SVT (supraventricular tachycardia) (HCC)   Infection due to ESBL-producing Klebsiella pneumoniae   Gram-negative bacteremia   Hyperglycemia   Flank pain   Pneumococcal bacteremia   Pneumaturia    busPIRone   10 mg Oral BID   clopidogrel   75 mg Oral Daily   famotidine   20 mg Oral BID   insulin  aspart  0-15 Units Subcutaneous TID WC   insulin  aspart  0-5 Units Subcutaneous QHS   insulin  glargine-yfgn  30 Units Subcutaneous Daily   levETIRAcetam   500 mg Oral BID   lipase/protease/amylase  36,000 Units Oral TID WC   metoprolol  tartrate  50 mg Oral BID   nicotine   14 mg Transdermal Daily   polyethylene glycol  17 g Oral Daily   rosuvastatin   5 mg Oral QHS   senna-docusate  2 tablet Oral QHS   sodium chloride  flush  3 mL Intravenous  Q12H   tamsulosin   0.4 mg Oral QPC supper   traZODone   150 mg Oral QHS    SUBJECTIVE: Seems a little confused today. Talking about how we fed his roommate first and he has low blood sugar.   He says a PICC line is too dangerous .  Review of Systems: Review of Systems  Constitutional:  Negative for fever.    No Known Allergies  OBJECTIVE: Vitals:   01/13/24 0014 01/13/24 0400 01/13/24 0839 01/13/24 1155  BP: 107/74 102/74 (!) 143/101 123/78  Pulse: (!) 107     Resp: 18 20 15 17   Temp: 97.9 F (36.6 C) 97.8 F (36.6 C) 98.5 F (36.9 C) 98.6 F (37 C)  TempSrc: Axillary Axillary Oral Oral  SpO2: 96%      There is no height or weight on file to calculate BMI.  Physical Exam  Lab  Results Lab Results  Component Value Date   WBC 15.7 (H) 01/13/2024   HGB 9.4 (L) 01/13/2024   HCT 27.7 (L) 01/13/2024   MCV 89.9 01/13/2024   PLT 578 (H) 01/13/2024    Lab Results  Component Value Date   CREATININE 0.56 (L) 01/13/2024   BUN 9 01/13/2024   NA 127 (L) 01/13/2024   K 4.3 01/13/2024   CL 94 (L) 01/13/2024   CO2 25 01/13/2024    Lab Results  Component Value Date   ALT 78 (H) 01/13/2024   AST 112 (H) 01/13/2024   ALKPHOS 242 (H) 01/13/2024   BILITOT 0.7 01/13/2024     Microbiology: Recent Results (from the past 240 hours)  Blood culture (routine x 2)     Status: Abnormal   Collection Time: 01/06/24  1:14 PM   Specimen: BLOOD  Result Value Ref Range Status   Specimen Description   Final    BLOOD SITE NOT SPECIFIED Performed at [facility], [address], [place], [place] [address]    Special Requests   Final    BOTTLES DRAWN AEROBIC AND ANAEROBIC Blood Culture adequate volume Performed at [facility], [address], [place], [place] [address]    Culture  Setup Time   Final    GRAM NEGATIVE RODS IN BOTH AEROBIC AND ANAEROBIC BOTTLES CRITICAL RESULT CALLED TO, READ BACK BY AND VERIFIED WITH: RN ED AT [facility] T[place] RIVER [id number] AT 1055 BY CM Performed at [facility], [address] [place], [place] [address]    Culture (A)  Final    KLEBSIELLA PNEUMONIAE Confirmed Extended Spectrum Beta-Lactamase Producer (ESBL).  In bloodstream infections from ESBL organisms, carbapenems are preferred over piperacillin/tazobactam. They are shown to have a lower risk of mortality.    Report Status 01/09/2024 FINAL  Final   Organism ID, Bacteria KLEBSIELLA PNEUMONIAE  Final      Susceptibility   Klebsiella pneumoniae - MIC*    AMPICILLIN >=32 RESISTANT Resistant     CEFEPIME  >=32 RESISTANT Resistant     CEFTAZIDIME RESISTANT Resistant     CEFTRIAXONE  >=64 RESISTANT Resistant     CIPROFLOXACIN  <=0.25 SENSITIVE Sensitive      GENTAMICIN >=16 RESISTANT Resistant     IMIPENEM <=0.25 SENSITIVE Sensitive     TRIMETH/SULFA >=320 RESISTANT Resistant     AMPICILLIN/SULBACTAM >=32 RESISTANT Resistant     PIP/TAZO <=4 SENSITIVE Sensitive ug/mL    * KLEBSIELLA PNEUMONIAE  Blood Culture ID Panel (Reflexed)     Status: Abnormal   Collection Time: 01/06/24  1:14 PM  Result Value  Ref Range Status   Enterococcus faecalis NOT DETECTED NOT DETECTED Final   Enterococcus Faecium NOT DETECTED NOT DETECTED Final   Listeria monocytogenes NOT DETECTED NOT DETECTED Final   Staphylococcus species NOT DETECTED NOT DETECTED Final   Staphylococcus aureus (BCID) NOT DETECTED NOT DETECTED Final   Staphylococcus epidermidis NOT DETECTED NOT DETECTED Final   Staphylococcus lugdunensis NOT DETECTED NOT DETECTED Final   Streptococcus species NOT DETECTED NOT DETECTED Final   Streptococcus agalactiae NOT DETECTED NOT DETECTED Final   Streptococcus pneumoniae NOT DETECTED NOT DETECTED Final   Streptococcus pyogenes NOT DETECTED NOT DETECTED Final   A.calcoaceticus-baumannii NOT DETECTED NOT DETECTED Final   Bacteroides fragilis NOT DETECTED NOT DETECTED Final   Enterobacterales DETECTED (A) NOT DETECTED Final    Comment: CRITICAL RESULT CALLED TO, READ BACK BY AND VERIFIED WITH: RN ED AT [facility] TJ RIVER [id number] AT 1055 BY CM    Enterobacter cloacae complex NOT DETECTED NOT DETECTED Final   Escherichia coli NOT DETECTED NOT DETECTED Final   Klebsiella aerogenes NOT DETECTED NOT DETECTED Final   Klebsiella oxytoca NOT DETECTED NOT DETECTED Final   Klebsiella pneumoniae DETECTED (A) NOT DETECTED Final    Comment: CRITICAL RESULT CALLED TO, READ BACK BY AND VERIFIED WITH: RN ED AT [facility] [id number] AT 1055 BY CM    Proteus species NOT DETECTED NOT DETECTED Final   Salmonella species NOT DETECTED NOT DETECTED Final   Serratia marcescens NOT DETECTED NOT DETECTED Final   Haemophilus influenzae NOT DETECTED NOT DETECTED Final   Neisseria  meningitidis NOT DETECTED NOT DETECTED Final   Pseudomonas aeruginosa NOT DETECTED NOT DETECTED Final   Stenotrophomonas maltophilia NOT DETECTED NOT DETECTED Final   Candida albicans NOT DETECTED NOT DETECTED Final   Candida auris NOT DETECTED NOT DETECTED Final   Candida glabrata NOT DETECTED NOT DETECTED Final   Candida krusei NOT DETECTED NOT DETECTED Final   Candida parapsilosis NOT DETECTED NOT DETECTED Final   Candida tropicalis NOT DETECTED NOT DETECTED Final   Cryptococcus neoformans/gattii NOT DETECTED NOT DETECTED Final   CTX-M ESBL DETECTED (A) NOT DETECTED Final    Comment: CRITICAL RESULT CALLED TO, READ BACK BY AND VERIFIED WITH: RN ED AT [facility] TJ RIVER [id number] AT 1055 BY CM (NOTE) Extended spectrum beta-lactamase detected. Recommend a carbapenem as initial therapy.      Carbapenem resistance IMP NOT DETECTED NOT DETECTED Final   Carbapenem resistance KPC NOT DETECTED NOT DETECTED Final   Carbapenem resistance NDM NOT DETECTED NOT DETECTED Final   Carbapenem resist OXA 48 LIKE NOT DETECTED NOT DETECTED Final   Carbapenem resistance VIM NOT DETECTED NOT DETECTED Final    Comment: Performed at [facility], [address] [place], [place] [address]  Urine Culture     Status: Abnormal   Collection Time: 01/06/24  4:15 PM   Specimen: Urine, Clean Catch  Result Value Ref Range Status   Specimen Description   Final    URINE, CLEAN CATCH Performed at [facility], [address] [place], [place] [address]    Special Requests   Final    NONE Performed at [facility], [address], [place], [place] [address]    Culture (A)  Final    >=100,000 COLONIES/mL KLEBSIELLA PNEUMONIAE Confirmed Extended Spectrum Beta-Lactamase Producer (ESBL).  In bloodstream infections from ESBL organisms, carbapenems are preferred over piperacillin/tazobactam. They are shown to have a lower risk of mortality. >=100,000 COLONIES/mL ESCHERICHIA COLI  Report Status 01/09/2024 FINAL  Final   Organism ID, Bacteria KLEBSIELLA PNEUMONIAE (A)  Final   Organism ID, Bacteria ESCHERICHIA COLI (A)  Final      Susceptibility   Escherichia coli - MIC*    AMPICILLIN 8 SENSITIVE Sensitive     CEFAZOLIN  <=4 SENSITIVE Sensitive     CEFEPIME  <=0.12 SENSITIVE Sensitive     CEFTRIAXONE  <=0.25 SENSITIVE Sensitive     CIPROFLOXACIN  >=4 RESISTANT Resistant     GENTAMICIN <=1 SENSITIVE Sensitive     IMIPENEM <=0.25 SENSITIVE Sensitive     NITROFURANTOIN <=16 SENSITIVE Sensitive     TRIMETH/SULFA <=20 SENSITIVE Sensitive     AMPICILLIN/SULBACTAM <=2 SENSITIVE Sensitive     PIP/TAZO <=4 SENSITIVE Sensitive ug/mL    * >=100,000 COLONIES/mL ESCHERICHIA COLI   Klebsiella pneumoniae - MIC*    AMPICILLIN >=32 RESISTANT Resistant     CEFAZOLIN  >=64 RESISTANT Resistant     CEFEPIME  >=32 RESISTANT Resistant     CEFTRIAXONE  >=64 RESISTANT Resistant     CIPROFLOXACIN  <=0.25 SENSITIVE Sensitive     GENTAMICIN >=16 RESISTANT Resistant     IMIPENEM <=0.25 SENSITIVE Sensitive     NITROFURANTOIN 128 RESISTANT Resistant     TRIMETH/SULFA >=320 RESISTANT Resistant     AMPICILLIN/SULBACTAM >=32 RESISTANT Resistant     PIP/TAZO <=4 SENSITIVE Sensitive ug/mL    * >=100,000 COLONIES/mL KLEBSIELLA PNEUMONIAE  Blood culture (routine x 2)     Status: Abnormal   Collection Time: 01/06/24  4:16 PM   Specimen: BLOOD LEFT HAND  Result Value Ref Range Status   Specimen Description   Final    BLOOD LEFT HAND Performed at [facility], [address] [place], [place] [address]    Special Requests   Final    BOTTLES DRAWN AEROBIC AND ANAEROBIC Blood Culture adequate volume Performed at [facility], [address], [place], [place] [address]    Culture  Setup Time   Final    GRAM POSITIVE COCCI IN CLUSTERS IN BOTH AEROBIC AND ANAEROBIC BOTTLES PREVIOUSLY REPORTED AS: GRAM NEGATIVE RODS CORRECTED RESULTS CALLED TO: PHARMD C. AMEND [phone] @2012   FH Performed at [facility], [address] [place], [place] [address]    Culture STREPTOCOCCUS PNEUMONIAE (A)  Final   Report Status 01/09/2024 FINAL  Final   Organism ID, Bacteria STREPTOCOCCUS PNEUMONIAE  Final      Susceptibility   Streptococcus pneumoniae - MIC*    ERYTHROMYCIN 2 RESISTANT Resistant     LEVOFLOXACIN <=0.25 SENSITIVE Sensitive     VANCOMYCIN  <=0.12 SENSITIVE Sensitive     PENICILLIN (meningitis) <=0.06 SENSITIVE Sensitive     PENO - penicillin <=0.06      PENICILLIN (non-meningitis) <=0.06 SENSITIVE Sensitive     PENICILLIN (oral) <=0.06 SENSITIVE Sensitive     CEFTRIAXONE  (non-meningitis) <=0.12 SENSITIVE Sensitive     CEFTRIAXONE  (meningitis) <=0.12 SENSITIVE Sensitive     * STREPTOCOCCUS PNEUMONIAE  Blood Culture ID Panel (Reflexed)     Status: Abnormal   Collection Time: 01/06/24  4:16 PM  Result Value Ref Range Status   Enterococcus faecalis NOT DETECTED NOT DETECTED Final   Enterococcus Faecium NOT DETECTED NOT DETECTED Final   Listeria monocytogenes NOT DETECTED NOT DETECTED Final   Staphylococcus species NOT DETECTED NOT DETECTED Final   Staphylococcus aureus (BCID) NOT DETECTED NOT DETECTED Final   Staphylococcus epidermidis NOT DETECTED NOT DETECTED Final   Staphylococcus lugdunensis NOT DETECTED NOT DETECTED Final   Streptococcus species DETECTED (A) NOT  DETECTED Final    Comment: CRITICAL RESULT CALLED TO, READ BACK BY AND VERIFIED WITH: PHARMD C. AMEND [phone] @2012  FH    Streptococcus agalactiae NOT DETECTED NOT DETECTED Final   Streptococcus pneumoniae DETECTED (A) NOT DETECTED Final    Comment: CRITICAL RESULT CALLED TO, READ BACK BY AND VERIFIED WITH: PHARMD C. AMEND [phone] @2012  FH    Streptococcus pyogenes NOT DETECTED NOT DETECTED Final   A.calcoaceticus-baumannii NOT DETECTED NOT DETECTED Final   Bacteroides fragilis NOT DETECTED NOT DETECTED Final   Enterobacterales NOT DETECTED NOT DETECTED Final   Enterobacter cloacae  complex NOT DETECTED NOT DETECTED Final   Escherichia coli NOT DETECTED NOT DETECTED Final   Klebsiella aerogenes NOT DETECTED NOT DETECTED Final   Klebsiella oxytoca NOT DETECTED NOT DETECTED Final   Klebsiella pneumoniae NOT DETECTED NOT DETECTED Final   Proteus species NOT DETECTED NOT DETECTED Final   Salmonella species NOT DETECTED NOT DETECTED Final   Serratia marcescens NOT DETECTED NOT DETECTED Final   Haemophilus influenzae NOT DETECTED NOT DETECTED Final   Neisseria meningitidis NOT DETECTED NOT DETECTED Final   Pseudomonas aeruginosa NOT DETECTED NOT DETECTED Final   Stenotrophomonas maltophilia NOT DETECTED NOT DETECTED Final   Candida albicans NOT DETECTED NOT DETECTED Final   Candida auris NOT DETECTED NOT DETECTED Final   Candida glabrata NOT DETECTED NOT DETECTED Final   Candida krusei NOT DETECTED NOT DETECTED Final   Candida parapsilosis NOT DETECTED NOT DETECTED Final   Candida tropicalis NOT DETECTED NOT DETECTED Final   Cryptococcus neoformans/gattii NOT DETECTED NOT DETECTED Final    Comment: Performed at [facility], [address], [place], [place] [address]  Culture, blood (Routine X 2) w Reflex to ID Panel     Status: None   Collection Time: 01/08/24 10:32 AM   Specimen: BLOOD LEFT ARM  Result Value Ref Range Status   Specimen Description BLOOD LEFT ARM  Final   Special Requests   Final    BOTTLES DRAWN AEROBIC AND ANAEROBIC Blood Culture adequate volume   Culture   Final    NO GROWTH 5 DAYS Performed at [facility], [address], [place], [place] [address]    Report Status 01/13/2024 FINAL  Final  Culture, blood (Routine X 2) w Reflex to ID Panel     Status: None   Collection Time: 01/08/24 10:34 AM   Specimen: BLOOD  Result Value Ref Range Status   Specimen Description BLOOD SITE NOT SPECIFIED  Final   Special Requests AEROBIC BOTTLE ONLY Blood Culture adequate volume  Final   Culture   Final    NO GROWTH 5 DAYS Performed at [facility], [address] [place], [place] [address]    Report Status 01/13/2024 FINAL  Final    Gibson Kurtz, MSN, NP-C [facility]  [email or web address] Pager: [phone] Office: [phone] [facility] Main Line: [phone] *Secure Chat Communication Welcome

## 2024-01-14 DIAGNOSIS — G062 Extradural and subdural abscess, unspecified: Secondary | ICD-10-CM | POA: Diagnosis not present

## 2024-01-14 LAB — COMPREHENSIVE METABOLIC PANEL WITH GFR
ALT: 82 U/L — ABNORMAL HIGH (ref 0–44)
AST: 103 U/L — ABNORMAL HIGH (ref 15–41)
Albumin: <1.5 g/dL — ABNORMAL LOW (ref 3.5–5.0)
Alkaline Phosphatase: 262 U/L — ABNORMAL HIGH (ref 38–126)
Anion gap: 9 (ref 5–15)
BUN: 7 mg/dL (ref 6–20)
CO2: 27 mmol/L (ref 22–32)
Calcium: 8.6 mg/dL — ABNORMAL LOW (ref 8.9–10.3)
Chloride: 102 mmol/L (ref 98–111)
Creatinine, Ser: 0.44 mg/dL — ABNORMAL LOW (ref 0.61–1.24)
GFR, Estimated: >60 mL/min (ref >60)
Glucose, Bld: 122 mg/dL — ABNORMAL HIGH (ref 70–99)
Potassium: 3.8 mmol/L (ref 3.5–5.1)
Sodium: 138 mmol/L (ref 135–145)
Total Bilirubin: 0.6 mg/dL (ref 0.0–1.2)
Total Protein: 6 g/dL — ABNORMAL LOW (ref 6.5–8.1)

## 2024-01-14 LAB — CBC WITH DIFFERENTIAL/PLATELET
Abs Immature Granulocytes: 0.14 10*3/uL — ABNORMAL HIGH (ref 0.00–0.07)
Basophils Absolute: 0 10*3/uL (ref 0.0–0.1)
Basophils Relative: 0 %
Eosinophils Absolute: 0 10*3/uL (ref 0.0–0.5)
Eosinophils Relative: 0 %
HCT: 29.7 % — ABNORMAL LOW (ref 39.0–52.0)
Hemoglobin: 9.6 g/dL — ABNORMAL LOW (ref 13.0–17.0)
Immature Granulocytes: 1 %
Lymphocytes Relative: 9 %
Lymphs Abs: 1.2 10*3/uL (ref 0.7–4.0)
MCH: 30.4 pg (ref 26.0–34.0)
MCHC: 32.3 g/dL (ref 30.0–36.0)
MCV: 94 fL (ref 80.0–100.0)
Monocytes Absolute: 0.8 10*3/uL (ref 0.1–1.0)
Monocytes Relative: 6 %
Neutro Abs: 10.8 10*3/uL — ABNORMAL HIGH (ref 1.7–7.7)
Neutrophils Relative %: 84 %
Platelets: 613 10*3/uL — ABNORMAL HIGH (ref 150–400)
RBC: 3.16 MIL/uL — ABNORMAL LOW (ref 4.22–5.81)
RDW: 15.5 % (ref 11.5–15.5)
WBC: 12.9 10*3/uL — ABNORMAL HIGH (ref 4.0–10.5)
nRBC: 0 % (ref 0.0–0.2)

## 2024-01-14 LAB — GLUCOSE, CAPILLARY
Glucose-Capillary: 89 mg/dL (ref 70–99)
Glucose-Capillary: 97 mg/dL (ref 70–99)
Glucose-Capillary: 83 mg/dL (ref 70–99)
Glucose-Capillary: 149 mg/dL — ABNORMAL HIGH (ref 70–99)

## 2024-01-14 LAB — C-REACTIVE PROTEIN: CRP: 13.8 mg/dL — ABNORMAL HIGH (ref <1.0)

## 2024-01-14 LAB — PROCALCITONIN: Procalcitonin: <0.1 ng/mL

## 2024-01-14 LAB — UREA NITROGEN, URINE: Urea Nitrogen, Ur: 441 mg/dL

## 2024-01-14 LAB — MAGNESIUM: Magnesium: 1.6 mg/dL — ABNORMAL LOW (ref 1.7–2.4)

## 2024-01-14 MED ORDER — MAGNESIUM SULFATE 4 GM/100ML IV SOLN
4.0000 g | Freq: Once | INTRAVENOUS | Status: AC
Start: 2024-01-14 — End: 2024-01-14
  Administered 2024-01-14: 4 g via INTRAVENOUS
  Filled 2024-01-14: qty 100

## 2024-01-14 NOTE — Progress Notes (Signed)
 PROGRESS NOTE        PATIENT DETAILS Name: Brandon [name] Age: 58 y.o. Sex: male Date of Birth: [birth year] Admit Date: 01/06/2024 Admitting Physician Arnulfo Larch, MD QMV:HQIONGE, [place] Kugel, FNP  Brief Summary: Patient is a 58 y.o.  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  Significant events: 4/15>> low back pain-emphysematous cystitis/gas in pelvis-admit to [facility]. 4/16>> MRI T-spine/L-spine with epidural abscess from T10-11 to L5-S1.  Significant studies: 4/15>> CT abdomen/pelvis: Extensive emphysematous cystitis-with mild extravesical extension of gas along both pelvic sidewalls, scattered abnormal gas in the spinal canal and from T12-L5 level. 4/16>> MRI C-spine: No evidence of discitis/abscess 4/16>> MRI thoracic/lumbar spine: Epidural abscess spanning T10-11 to L5-S1, disc signal at L4-5 and L5-S1.  Lower cord/cauda equina is diffusely compressed by collection.  4/18 >> TTE 1. Left ventricular ejection fraction, by estimation, is 65 to 70%. The left ventricle has normal function. There is mild left ventricular hypertrophy. Left ventricular diastolic parameters were normal.  2. Right ventricular systolic function is normal. The right ventricular size is normal.  3. The mitral valve is normal in structure. No evidence of mitral valve regurgitation.  4. AV is thickened, moderately calcified with no significant stenosis.. The aortic valve is tricuspid. Aortic valve regurgitation is not visualized. Aortic valve sclerosis/calcification is present, without any evidence of aortic  stenosis.  4/22.  PICC line.  4/22.  MRI T-spine repeat - 1. The examination was terminated prior to completion by the patient. No axial images were obtained. 2. Decreased size of dorsal collection of the lower thoracic spinal canal. Residual material measures approximately 3 mm thick, though assessment is limited in the absence of axial images and contrast agent. 3. Unchanged appearance at the T9-11 levels with ankylosis at the site of prior discitis-osteomyelitis. No evidence of active discitis-osteomyelitis.   Significant microbiology data: 4/15>> blood culture: Strep pneumo/ESBL Klebsiella pneumoniae 4/17>> blood culture: No growth.  Procedures:  Consults: ID Neurosurgery Cardiology. Urology  Subjective:  Patient in bed, appears comfortable, denies any headache, no fever, no chest pain or pressure, no shortness of breath , no abdominal pain. No new focal weakness.    Objective: Vitals: Blood pressure 98/64, pulse (!) 103, temperature 98.5 F (36.9 C), temperature source Oral, resp. rate 16, SpO2 96%.   Exam:  Frail and cachectic, middle-aged white male, awake Alert, No new F.N deficits, chronic left-sided hemiparesis from previous stroke, condom catheter in place, [name].AT,PERRAL Supple Neck, No JVD,   Symmetrical Chest wall movement, Good air movement bilaterally, CTAB RRR,No Gallops, Rubs or new Murmurs,  +ve B.Sounds, Abd Soft, No tenderness,   No Cyanosis, Clubbing or edema    Assessment/Plan:  Sepsis secondary to polymicrobial bacteremia with streptococci/Klebsiella pneumonia-along with emphysematous cystitis with T10-11 to L5-S1 epidural abscess with resultant cord compression Sepsis physiology gradually improving Uncooperative-but appears to have significant pain/weakness in bilateral lower extremities compared to upper  Has been seen by both IR and neurosurgeon Dr. Ellery Guthrie, not a candidate for any procedure per both IR and neurosurgery.  Repeat MRI T-spine  on  01/13/2024 although limited as testing was aborted by the patient midway appears stable. Evaluated by urology-apart from antibiotics - possible Foley catheter placement (patient refused) and outpatient cystoscopy-no further inpatient recommendations. Continue meropenem , Repeat blood cultures negative so far TTE done on 4/18-appears nonacute Case discussed with ID, he will get a PICC line with 8 weeks of IV Meropenam stop date 03/04/24 .  PICC line placed 01/13/2024.  Postdischarge follow-up with neurosurgery, ID and urology.  SVT with aberrancy [name] in sinus, TSH stable, echo stable, beta-blocker adjusted on 01/13/2024  Acute metabolic encephalopathy Secondary to sepsis physiology Improved-but easily agitated this morning.  Answer simple questions appropriately.  Hyponatremia.  SIADH, failed fluid restriction and Lasix , trial of Samsca  on 01/13/2024  Hypokalemia/hypomagnesemia Repleted  Transaminitis Suspect this is sepsis/shock liver Downtrending-continue to follow-if worsens then will initiate further workup.  History of CVA with significant left-sided hemiparesis Difficult exam but seems to have significant paraparesis/pain issues from cord compression due to epidural abscess Per patient he apparently was using a quad cane-however per neurosurgery-patient has been nonambulatory for several months.  CAD-s/p PCI in 2016 No anginal symptoms Since no plans by IR to aspirate epidural abscess-resume Plavix .  HTN - B. Blocker adjusted further on 01/13/24  History of seizure disorder Keppra   History of chronic pancreatitis Presumably from prior history of EtOH use Continue Creon   BPH Flomax   History of EtOH use Apparently not drinkiNG anymore-SNF resident Will watch for withdrawal symptoms  History of tobacco abuse - counseled to quit  DM-2 (A1c 9.4 on 4/16) with uncontrolled hyperglycemia CBG running high insulin  adjusted further on 01/12/2024 for better control  CBG (last  3)  Recent Labs    01/13/24 1154 01/13/24 1712 01/13/24 2026  GLUCAP 58* 81 95    Lab Results  Component Value Date   HGBA1C 9.4 (H) 01/07/2024      Code status:   Code Status: Full Code   DVT Prophylaxis: SCDs Start: 01/06/24 2054   Family Communication: Sister-[name] Justice-[phone] left VM 4/17,4/18   Disposition Plan: Status is: Inpatient Remains inpatient appropriate because: Severity of illness   Planned Discharge Destination:Skilled nursing facility   Diet: Diet Order             Diet heart healthy/carb modified Fluid consistency: Thin  Diet effective now                    MEDICATIONS: Scheduled Meds:  busPIRone   10 mg Oral BID   Chlorhexidine  Gluconate Cloth  6 each Topical Daily   clopidogrel   75 mg Oral Daily   famotidine   20 mg Oral BID   insulin  aspart  0-15 Units Subcutaneous TID WC   insulin  aspart  0-5 Units Subcutaneous QHS   insulin  glargine-yfgn  30 Units Subcutaneous Daily   levETIRAcetam   500 mg Oral BID   lipase/protease/amylase  36,000 Units Oral TID WC   metoprolol  tartrate  50 mg Oral BID   nicotine   14 mg Transdermal Daily   polyethylene glycol  17 g Oral Daily   rosuvastatin   5 mg Oral QHS   senna-docusate  2 tablet Oral QHS   sodium chloride  flush  3 mL Intravenous Q12H   tamsulosin   0.4 mg Oral QPC supper   traZODone   150 mg Oral QHS   Continuous Infusions:  magnesium  sulfate bolus IVPB     meropenem  (MERREM ) IV 1 g (01/14/24 0546)   PRN Meds:.acetaminophen , albuterol , dextrose , HYDROmorphone  (DILAUDID ) injection, LORazepam , metoprolol  tartrate, oxyCODONE  **OR** oxyCODONE ,  sodium chloride  flush   I have personally reviewed following labs and imaging studies  LABORATORY DATA: Recent Labs  Lab 01/10/24 0513 01/11/24 0652 01/12/24 0455 01/13/24 0356 01/14/24 0247  WBC 15.1* 15.5* 14.3* 15.7* 12.9*  HGB 9.7* 10.5* 10.3* 9.4* 9.6*  HCT 29.9* 31.2* 31.3* 27.7* 29.7*  PLT 494* 531* 546* 578* 613*  MCV 92.6  90.7 92.1 89.9 94.0  MCH 30.0 30.5 30.3 30.5 30.4  MCHC 32.4 33.7 32.9 33.9 32.3  RDW 15.4 15.2 15.2 15.2 15.5  LYMPHSABS  --  1.1 1.3 1.1 1.2  MONOABS  --  0.9 0.9 0.8 0.8  EOSABS  --  0.0 0.1 0.0 0.0  BASOSABS  --  0.0 0.0 0.0 0.0    Recent Labs  Lab 01/08/24 0413 01/08/24 0413 01/09/24 0500 01/10/24 0513 01/10/24 1459 01/11/24 0652 01/12/24 0455 01/13/24 0356 01/14/24 0247  NA 133*  --  132* 135  --  129* 129* 127* 138  K 3.1*  --  3.9 3.4*  --  4.2 3.4* 4.3 3.8  CL 96*  --  98 99  --  96* 93* 94* 102  CO2 22  --  16* 25  --  25 27 25 27   ANIONGAP 15  --  18* 11  --  8 9 8 9   GLUCOSE 228*  --  336* 269*  --  386* 114* 91 122*  BUN 14  --  12 10  --  11 9 9 7   CREATININE 0.65  --  0.90 0.64  --  0.53* 0.46* 0.56* 0.44*  AST 185*  --  52*  --   --  132* 86* 112* 103*  ALT 87*  --  65*  --   --  91* 71* 78* 82*  ALKPHOS 205*  --  224*  --   --  285* 213* 242* 262*  BILITOT 0.9  --  1.5*  --   --  0.7 0.7 0.7 0.6  ALBUMIN 1.5*  --  1.6*  --   --  1.5* 1.5* <1.5* <1.5*  CRP  --    < > 20.8*  --  16.4* 15.7* 14.0* 12.9* 13.8*  PROCALCITON  --   --   --  0.49  --  0.23 0.10 0.10 <0.10  INR 1.5*  --   --   --   --   --   --   --   --   TSH  --   --  0.496  --   --   --   --   --   --   MG 1.6*  --  1.8  --   --  1.4* 1.6* 1.8 1.6*  PHOS  --   --  2.8  --   --   --   --   --   --   CALCIUM  8.4*  --  8.3* 8.5*  --  8.3* 8.1* 7.9* 8.6*   < > = values in this interval not displayed.     Recent Labs  Lab 01/08/24 0413 01/08/24 0413 01/09/24 0500 01/10/24 0513 01/10/24 1459 01/11/24 0652 01/12/24 0455 01/13/24 0356 01/14/24 0247  CRP  --    < > 20.8*  --  16.4* 15.7* 14.0* 12.9* 13.8*  PROCALCITON  --   --   --  0.49  --  0.23 0.10 0.10 <0.10  INR 1.5*  --   --   --   --   --   --   --   --  TSH  --   --  0.496  --   --   --   --   --   --   MG 1.6*  --  1.8  --   --  1.4* 1.6* 1.8 1.6*  CALCIUM  8.4*  --  8.3* 8.5*  --  8.3* 8.1* 7.9* 8.6*   < > = values in this  interval not displayed.  ------------------------------------------------------------------------------------------------- Lab Results  Component Value Date   CHOL 74 03/06/2016   HDL 27 (L) 03/06/2016   LDLCALC 37 03/06/2016   TRIG 51 03/06/2016   CHOLHDL 2.7 03/06/2016    Lab Results  Component Value Date   HGBA1C 9.4 (H) 01/07/2024   No results for input(s): "TSH", "T4TOTAL", "FREET4", "T3FREE", "THYROIDAB" in the last 72 hours.  Radiology Reports  MR THORACIC SPINE WO CONTRAST Result Date: 01/13/2024 CLINICAL DATA:  Thoracic osteomyelitis EXAM: MRI THORACIC SPINE WITHOUT CONTRAST TECHNIQUE: Multiplanar, multisequence MR imaging of the thoracic spine was performed. No intravenous contrast was administered. COMPARISON:  01/07/2024 FINDINGS: The examination was terminated prior to completion by the patient. No axial images were obtained. Alignment: Physiologic. Vertebrae: Unchanged appearance at the T9-11 levels with ankylosis at the site of prior discitis-osteomyelitis. Unchanged chronic T12 compression deformity. No evidence of active discitis-osteomyelitis. Cord: Decreased size of dorsal collection of the lower thoracic spinal canal. Residual material measures approximately 3 mm thick, though assessment is limited in the absence of axial images and the absence of contrast. Paraspinal and other soft tissues: Negative Disc levels: No spinal canal stenosis. IMPRESSION: 1. The examination was terminated prior to completion by the patient. No axial images were obtained. 2. Decreased size of dorsal collection of the lower thoracic spinal canal. Residual material measures approximately 3 mm thick, though assessment is limited in the absence of axial images and contrast agent. 3. Unchanged appearance at the T9-11 levels with ankylosis at the site of prior discitis-osteomyelitis. No evidence of active discitis-osteomyelitis. Electronically Signed   By: Juanetta Nordmann M.D.   On: 01/13/2024 23:02   US   EKG SITE RITE Result Date: 01/12/2024 If Site Rite image not attached, placement could not be confirmed due to current cardiac rhythm.    RADIOLOGY STUDIES/RESULTS: MR THORACIC SPINE WO CONTRAST Result Date: 01/13/2024 CLINICAL DATA:  Thoracic osteomyelitis EXAM: MRI THORACIC SPINE WITHOUT CONTRAST TECHNIQUE: Multiplanar, multisequence MR imaging of the thoracic spine was performed. No intravenous contrast was administered. COMPARISON:  01/07/2024 FINDINGS: The examination was terminated prior to completion by the patient. No axial images were obtained. Alignment: Physiologic. Vertebrae: Unchanged appearance at the T9-11 levels with ankylosis at the site of prior discitis-osteomyelitis. Unchanged chronic T12 compression deformity. No evidence of active discitis-osteomyelitis. Cord: Decreased size of dorsal collection of the lower thoracic spinal canal. Residual material measures approximately 3 mm thick, though assessment is limited in the absence of axial images and the absence of contrast. Paraspinal and other soft tissues: Negative Disc levels: No spinal canal stenosis. IMPRESSION: 1. The examination was terminated prior to completion by the patient. No axial images were obtained. 2. Decreased size of dorsal collection of the lower thoracic spinal canal. Residual material measures approximately 3 mm thick, though assessment is limited in the absence of axial images and contrast agent. 3. Unchanged appearance at the T9-11 levels with ankylosis at the site of prior discitis-osteomyelitis. No evidence of active discitis-osteomyelitis. Electronically Signed   By: Juanetta Nordmann M.D.   On: 01/13/2024 23:02   US  EKG SITE RITE Result Date: 01/12/2024 If  Site Rite image not attached, placement could not be confirmed due to current cardiac rhythm.    LOS: 8 days   Signature  -    Lynnwood Sauer M.D on 01/14/2024 at 7:56 AM   -  To page go to [email or web address]

## 2024-01-14 NOTE — Progress Notes (Signed)
 Patient ID: Brandon [name], male   DOB: 1966/06/06, 58 y.o.   MRN: [id number] Patient's vital signs been stable He was not able to fully complete the MRI of the thoracic spine however on the available images it is clear that the dorsal epidural collection in the lower thoracic spine is markedly improved suggesting a positive response to therapy.  This point no surgical intervention will be planned for this individual.  He should complete his course of antibiotics and clinical follow-up of lab studies would be appropriate.  I will sign off at this time please reconsult if further neurosurgical consideration is required.

## 2024-01-14 NOTE — Plan of Care (Signed)

## 2024-01-14 NOTE — Plan of Care (Signed)
  Problem: Education: Goal: Ability to describe self-care measures that may prevent or decrease complications (Diabetes Survival Skills Education) will improve Outcome: Progressing   Problem: Fluid Volume: Goal: Ability to maintain a balanced intake and output will improve Outcome: Progressing   Problem: Health Behavior/Discharge Planning: Goal: Ability to identify and utilize available resources and services will improve Outcome: Progressing   Problem: Nutritional: Goal: Maintenance of adequate nutrition will improve Outcome: Progressing   Problem: Skin Integrity: Goal: Risk for impaired skin integrity will decrease Outcome: Progressing   Problem: Nutritional: Goal: Maintenance of adequate nutrition will improve Outcome: Progressing   Problem: Education: Goal: Knowledge of General Education information will improve Description: Including pain rating scale, medication(s)/side effects and non-pharmacologic comfort measures Outcome: Progressing   Problem: Clinical Measurements: Goal: Will remain free from infection Outcome: Progressing Goal: Diagnostic test results will improve Outcome: Progressing

## 2024-01-14 NOTE — TOC Progression Note (Signed)
 Transition of Care [facility]) - Progression Note    Patient Details  Name: Brandon [name] MRN: [id number] Date of Birth: 18-Sep-1966  Transition of Care [facility]) CM/SW Contact  Jannice Mends, LCSW Phone Number: 01/14/2024, 5:02 PM  Clinical Narrative:    CSW continuing to follow.   Expected Discharge Plan: Skilled Nursing Facility Barriers to Discharge: Continued Medical Work up  Expected Discharge Plan and Services In-house Referral: Clinical Social Work   Post Acute Care Choice: Skilled Nursing Facility Living arrangements for the past 2 months: Skilled Nursing Facility                                       Social Determinants of Health (SDOH) Interventions SDOH Screenings   Food Insecurity: Patient Unable To Answer (01/08/2024)  Housing: Patient Declined (01/08/2024)  Transportation Needs: Patient Declined (01/08/2024)  Utilities: Patient Declined (01/08/2024)  Physical Activity: Inactive (11/15/2021)  Stress: No Stress Concern Present (10/31/2021)  Tobacco Use: High Risk (01/06/2024)    Readmission Risk Interventions    01/08/2024   11:12 AM  Readmission Risk Prevention Plan  Transportation Screening Complete  Medication Review (RN Care Manager) Complete  PCP or Specialist appointment within 3-5 days of discharge Complete  HRI or Home Care Consult Complete  SW Recovery Care/Counseling Consult Complete  Palliative Care Screening Not Applicable  Skilled Nursing Facility Complete

## 2024-01-14 NOTE — TOC Progression Note (Signed)
 Transition of Care [facility]) - Progression Note    Patient Details  Name: Brandon [name] MRN: [id number] Date of Birth: 09-01-1966  Transition of Care [facility]) CM/SW Contact  Jannice Mends, LCSW Phone Number: 01/14/2024, 5:02 PM  Clinical Narrative:    CSW continuing to follow for medical stability.    Expected Discharge Plan: Skilled Nursing Facility Barriers to Discharge: Continued Medical Work up  Expected Discharge Plan and Services In-house Referral: Clinical Social Work   Post Acute Care Choice: Skilled Nursing Facility Living arrangements for the past 2 months: Skilled Nursing Facility                                       Social Determinants of Health (SDOH) Interventions SDOH Screenings   Food Insecurity: Patient Unable To Answer (01/08/2024)  Housing: Patient Declined (01/08/2024)  Transportation Needs: Patient Declined (01/08/2024)  Utilities: Patient Declined (01/08/2024)  Physical Activity: Inactive (11/15/2021)  Stress: No Stress Concern Present (10/31/2021)  Tobacco Use: High Risk (01/06/2024)    Readmission Risk Interventions    01/08/2024   11:12 AM  Readmission Risk Prevention Plan  Transportation Screening Complete  Medication Review (RN Care Manager) Complete  PCP or Specialist appointment within 3-5 days of discharge Complete  HRI or Home Care Consult Complete  SW Recovery Care/Counseling Consult Complete  Palliative Care Screening Not Applicable  Skilled Nursing Facility Complete

## 2024-01-14 NOTE — Inpatient Diabetes Management (Signed)
 Inpatient Diabetes Program Recommendations  AACE/ADA: New Consensus Statement on Inpatient Glycemic Control (2015)  Target Ranges:  Prepandial:   less than 140 mg/dL      Peak postprandial:   less than 180 mg/dL (1-2 hours)      Critically ill patients:  140 - 180 mg/dL   Lab Results  Component Value Date   GLUCAP 97 01/14/2024   HGBA1C 9.4 (H) 01/07/2024    Review of Glycemic Control  Diabetes history: DM2 Outpatient Diabetes medications: Lantus  27 at bedtime, Humalog 0-10 TID, metformin  1000 mg BID Current orders for Inpatient glycemic control: Novolog  0-15 TID with meals and 0-5 HS, Semglee  30 daily  Hypoglycemia yesterday morning, blood sugars tend to run on low side in am.   Inpatient Diabetes Program Recommendations:    Consider decreasing Semglee  to 25 units daily  Continue to follow.  Thank you. Joni Net, RD, LDN, CDCES Inpatient Diabetes Coordinator [phone]

## 2024-01-15 DIAGNOSIS — G062 Extradural and subdural abscess, unspecified: Secondary | ICD-10-CM | POA: Diagnosis not present

## 2024-01-15 LAB — CBC WITH DIFFERENTIAL/PLATELET
Abs Immature Granulocytes: 0.16 10*3/uL — ABNORMAL HIGH (ref 0.00–0.07)
Basophils Absolute: 0 10*3/uL (ref 0.0–0.1)
Basophils Relative: 0 %
Eosinophils Absolute: 0 10*3/uL (ref 0.0–0.5)
Eosinophils Relative: 0 %
HCT: 29.6 % — ABNORMAL LOW (ref 39.0–52.0)
Hemoglobin: 9.4 g/dL — ABNORMAL LOW (ref 13.0–17.0)
Immature Granulocytes: 1 %
Lymphocytes Relative: 10 %
Lymphs Abs: 1.3 10*3/uL (ref 0.7–4.0)
MCH: 30 pg (ref 26.0–34.0)
MCHC: 31.8 g/dL (ref 30.0–36.0)
MCV: 94.6 fL (ref 80.0–100.0)
Monocytes Absolute: 0.8 10*3/uL (ref 0.1–1.0)
Monocytes Relative: 6 %
Neutro Abs: 11.1 10*3/uL — ABNORMAL HIGH (ref 1.7–7.7)
Neutrophils Relative %: 83 %
Platelets: 638 10*3/uL — ABNORMAL HIGH (ref 150–400)
RBC: 3.13 MIL/uL — ABNORMAL LOW (ref 4.22–5.81)
RDW: 15.6 % — ABNORMAL HIGH (ref 11.5–15.5)
WBC: 13.4 10*3/uL — ABNORMAL HIGH (ref 4.0–10.5)
nRBC: 0 % (ref 0.0–0.2)

## 2024-01-15 LAB — COMPREHENSIVE METABOLIC PANEL WITH GFR
ALT: 61 U/L — ABNORMAL HIGH (ref 0–44)
AST: 61 U/L — ABNORMAL HIGH (ref 15–41)
Albumin: <1.5 g/dL — ABNORMAL LOW (ref 3.5–5.0)
Alkaline Phosphatase: 230 U/L — ABNORMAL HIGH (ref 38–126)
Anion gap: 10 (ref 5–15)
BUN: 7 mg/dL (ref 6–20)
CO2: 25 mmol/L (ref 22–32)
Calcium: 8.6 mg/dL — ABNORMAL LOW (ref 8.9–10.3)
Chloride: 97 mmol/L — ABNORMAL LOW (ref 98–111)
Creatinine, Ser: 0.4 mg/dL — ABNORMAL LOW (ref 0.61–1.24)
GFR, Estimated: >60 mL/min (ref >60)
Glucose, Bld: 93 mg/dL (ref 70–99)
Potassium: 3.8 mmol/L (ref 3.5–5.1)
Sodium: 132 mmol/L — ABNORMAL LOW (ref 135–145)
Total Bilirubin: 0.6 mg/dL (ref 0.0–1.2)
Total Protein: 6 g/dL — ABNORMAL LOW (ref 6.5–8.1)

## 2024-01-15 LAB — GLUCOSE, CAPILLARY
Glucose-Capillary: 94 mg/dL (ref 70–99)
Glucose-Capillary: 166 mg/dL — ABNORMAL HIGH (ref 70–99)
Glucose-Capillary: 137 mg/dL — ABNORMAL HIGH (ref 70–99)
Glucose-Capillary: 106 mg/dL — ABNORMAL HIGH (ref 70–99)

## 2024-01-15 LAB — MAGNESIUM: Magnesium: 1.6 mg/dL — ABNORMAL LOW (ref 1.7–2.4)

## 2024-01-15 LAB — PROCALCITONIN: Procalcitonin: <0.1 ng/mL

## 2024-01-15 MED ORDER — MAGNESIUM SULFATE 4 GM/100ML IV SOLN
4.0000 g | Freq: Two times a day (BID) | INTRAVENOUS | Status: AC
  Administered 2024-01-15 (×2): 4 g via INTRAVENOUS
  Filled 2024-01-15 (×2): qty 100

## 2024-01-15 MED ORDER — LACTATED RINGERS IV BOLUS
1000.0000 mL | Freq: Once | INTRAVENOUS | Status: AC
  Administered 2024-01-15: 1000 mL via INTRAVENOUS

## 2024-01-15 MED ORDER — MIDODRINE HCL 5 MG PO TABS
5.0000 mg | ORAL_TABLET | Freq: Three times a day (TID) | ORAL | Status: DC
  Administered 2024-01-15 – 2024-01-16 (×2): 5 mg via ORAL
  Filled 2024-01-15 (×2): qty 1

## 2024-01-15 MED ORDER — METOPROLOL TARTRATE 25 MG PO TABS
25.0000 mg | ORAL_TABLET | Freq: Two times a day (BID) | ORAL | Status: DC
  Administered 2024-01-15 – 2024-01-16 (×2): 25 mg via ORAL
  Filled 2024-01-15 (×2): qty 1

## 2024-01-15 NOTE — Progress Notes (Signed)
 PROGRESS NOTE        PATIENT DETAILS Name: Brandon [name] Age: 58 y.o. Sex: male Date of Birth: 04-Aug-1966 Admit Date: 01/06/2024 Admitting Physician Arnulfo Larch, MD PIR:JJOACZY, [place] Kugel, FNP  Brief Summary: Patient is a 58 y.o.  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  Significant events: 4/15>> low back pain-emphysematous cystitis/gas in pelvis-admit to [facility]. 4/16>> MRI T-spine/L-spine with epidural abscess from T10-11 to L5-S1.  Significant studies: 4/15>> CT abdomen/pelvis: Extensive emphysematous cystitis-with mild extravesical extension of gas along both pelvic sidewalls, scattered abnormal gas in the spinal canal and from T12-L5 level. 4/16>> MRI C-spine: No evidence of discitis/abscess 4/16>> MRI thoracic/lumbar spine: Epidural abscess spanning T10-11 to L5-S1, disc signal at L4-5 and L5-S1.  Lower cord/cauda equina is diffusely compressed by collection.  4/18 >> TTE 1. Left ventricular ejection fraction, by estimation, is 65 to 70%. The left ventricle has normal function. There is mild left ventricular hypertrophy. Left ventricular diastolic parameters were normal.  2. Right ventricular systolic function is normal. The right ventricular size is normal.  3. The mitral valve is normal in structure. No evidence of mitral valve regurgitation.  4. AV is thickened, moderately calcified with no significant stenosis.. The aortic valve is tricuspid. Aortic valve regurgitation is not visualized. Aortic valve sclerosis/calcification is present, without any evidence of aortic  stenosis.  4/22.  PICC line.  4/22.  MRI T-spine repeat - 1. The examination was terminated prior to completion by the patient. No axial images were obtained. 2. Decreased size of dorsal collection of the lower thoracic spinal canal. Residual material measures approximately 3 mm thick, though assessment is limited in the absence of axial images and contrast agent. 3. Unchanged appearance at the T9-11 levels with ankylosis at the site of prior discitis-osteomyelitis. No evidence of active discitis-osteomyelitis.   Significant microbiology data: 4/15>> blood culture: Strep pneumo/ESBL Klebsiella pneumoniae 4/17>> blood culture: No growth.  Procedures:  Consults: ID Neurosurgery Cardiology. Urology  Subjective:  Patient in bed, appears comfortable, denies any headache, no fever, no chest pain or pressure, no shortness of breath , no abdominal pain. No new focal weakness.  Objective: Vitals: Blood pressure 90/69, pulse 74, temperature 98.4 F (36.9 C), temperature source Oral, resp. rate 17, SpO2 100%.   Exam:  Frail and cachectic, middle-aged white male, awake Alert, No new F.N deficits, chronic left-sided hemiparesis from previous stroke, condom catheter in place, right arm PICC line, [place].AT,PERRAL Supple Neck, No JVD,   Symmetrical Chest wall movement, Good air movement bilaterally, CTAB RRR,No Gallops, Rubs or new Murmurs,  +ve B.Sounds, Abd Soft, No tenderness,   No Cyanosis, Clubbing or edema    Assessment/Plan:  Sepsis secondary to polymicrobial bacteremia with streptococci/Klebsiella pneumonia-along with emphysematous cystitis with T10-11 to L5-S1 epidural abscess with resultant cord compression Sepsis physiology gradually improving Uncooperative-but appears to have significant pain/weakness in bilateral lower extremities compared to upper  Has been seen by both IR and neurosurgeon Dr. Ellery Guthrie, not a candidate for any procedure per both IR and neurosurgery.  Repeat MRI  T-spine on 01/13/2024 although limited as testing was aborted by the patient midway appears stable. Evaluated by urology-apart from antibiotics - possible Foley catheter placement (patient refused) and outpatient cystoscopy-no further inpatient recommendations. Continue meropenem , Repeat blood cultures negative so far TTE done on 4/18-appears nonacute Case discussed with ID, he will get a PICC line with 8 weeks of IV Meropenam stop date 03/04/24 .  PICC line placed 01/13/2024.  Postdischarge follow-up with neurosurgery, ID and urology.  SVT with aberrancy [name] in sinus, TSH stable, echo stable, beta-blocker adjusted on 01/13/2024  Acute metabolic encephalopathy Secondary to sepsis physiology Improved-but easily agitated this morning.  Answer simple questions appropriately.  Hyponatremia.  SIADH, failed fluid restriction and Lasix , trial of Samsca  on 01/13/2024  Hypokalemia/hypomagnesemia  - Repleted  Transaminitis Suspect this is sepsis/shock liver Downtrending-continue to follow-if worsens then will initiate further workup.  History of CVA with significant left-sided hemiparesis Difficult exam but seems to have significant paraparesis/pain issues from cord compression due to epidural abscess Per patient he apparently was using a quad cane-however per neurosurgery-patient has been nonambulatory for several months.  CAD-s/p PCI in 2016 No anginal symptoms Since no plans by IR to aspirate epidural abscess-resume Plavix .  HTN - B. Blocker adjusted further on 01/13/24  History of seizure disorder  Keppra   History of chronic pancreatitis Presumably from prior history of EtOH use Continue Creon   BPH  Flomax   History of EtOH use Apparently not drinkiNG anymore-SNF resident Will watch for withdrawal symptoms  History of tobacco abuse - counseled to quit  DM-2 (A1c 9.4 on 4/16) with uncontrolled hyperglycemia CBG running high insulin  adjusted further on 01/12/2024 for better  control  CBG (last 3)  Recent Labs    01/14/24 1220 01/14/24 1606 01/14/24 2229  GLUCAP 97 83 149*    Lab Results  Component Value Date   HGBA1C 9.4 (H) 01/07/2024      Code status:   Code Status: Full Code   DVT Prophylaxis: SCDs Start: 01/06/24 2054   Family Communication: Sister-[name] [phone] left VM 4/17,4/18   Disposition Plan: Status is: Inpatient Remains inpatient appropriate because: Severity of illness   Planned Discharge Destination:Skilled nursing facility   Diet: Diet Order             Diet heart healthy/carb modified Fluid consistency: Thin; Fluid restriction: 1200 mL Fluid  Diet effective now                    MEDICATIONS: Scheduled Meds:  busPIRone   10 mg Oral BID   Chlorhexidine  Gluconate Cloth  6 each Topical Daily   clopidogrel   75 mg Oral Daily   famotidine   20 mg Oral BID   insulin  aspart  0-15 Units Subcutaneous TID WC   insulin  aspart  0-5 Units Subcutaneous QHS   insulin  glargine-yfgn  30 Units Subcutaneous Daily   levETIRAcetam   500 mg Oral BID   lipase/protease/amylase  36,000 Units Oral TID WC   metoprolol  tartrate  50 mg Oral BID   nicotine   14 mg Transdermal Daily   polyethylene glycol  17 g Oral Daily   rosuvastatin   5 mg Oral QHS   senna-docusate  2 tablet Oral QHS   sodium chloride  flush  3 mL Intravenous Q12H   tamsulosin   0.4 mg Oral QPC supper   traZODone   150 mg Oral QHS   Continuous Infusions:  magnesium  sulfate bolus IVPB 4 g (01/15/24 0650)   meropenem  (MERREM ) IV 1 g (01/15/24 0511)   PRN Meds:.acetaminophen ,  albuterol , dextrose , HYDROmorphone  (DILAUDID ) injection, LORazepam , metoprolol  tartrate, oxyCODONE  **OR** oxyCODONE , sodium chloride  flush   I have personally reviewed following labs and imaging studies  LABORATORY DATA: Recent Labs  Lab 01/11/24 0652 01/12/24 0455 01/13/24 0356 01/14/24 0247 01/15/24 0438  WBC 15.5* 14.3* 15.7* 12.9* 13.4*  HGB 10.5* 10.3* 9.4* 9.6* 9.4*  HCT  31.2* 31.3* 27.7* 29.7* 29.6*  PLT 531* 546* 578* 613* 638*  MCV 90.7 92.1 89.9 94.0 94.6  MCH 30.5 30.3 30.5 30.4 30.0  MCHC 33.7 32.9 33.9 32.3 31.8  RDW 15.2 15.2 15.2 15.5 15.6*  LYMPHSABS 1.1 1.3 1.1 1.2 1.3  MONOABS 0.9 0.9 0.8 0.8 0.8  EOSABS 0.0 0.1 0.0 0.0 0.0  BASOSABS 0.0 0.0 0.0 0.0 0.0    Recent Labs  Lab 01/09/24 0500 01/10/24 0513 01/10/24 1459 01/11/24 0652 01/12/24 0455 01/13/24 0356 01/14/24 0247 01/15/24 0438  NA 132*   < >  --  129* 129* 127* 138 132*  K 3.9   < >  --  4.2 3.4* 4.3 3.8 3.8  CL 98   < >  --  96* 93* 94* 102 97*  CO2 16*   < >  --  25 27 25 27 25   ANIONGAP 18*   < >  --  8 9 8 9 10   GLUCOSE 336*   < >  --  386* 114* 91 122* 93  BUN 12   < >  --  11 9 9 7 7   CREATININE 0.90   < >  --  0.53* 0.46* 0.56* 0.44* 0.40*  AST 52*  --   --  132* 86* 112* 103* 61*  ALT 65*  --   --  91* 71* 78* 82* 61*  ALKPHOS 224*  --   --  285* 213* 242* 262* 230*  BILITOT 1.5*  --   --  0.7 0.7 0.7 0.6 0.6  ALBUMIN 1.6*  --   --  1.5* 1.5* <1.5* <1.5* <1.5*  CRP 20.8*  --  16.4* 15.7* 14.0* 12.9* 13.8*  --   PROCALCITON  --    < >  --  0.23 0.10 0.10 <0.10 <0.10  TSH 0.496  --   --   --   --   --   --   --   MG 1.8  --   --  1.4* 1.6* 1.8 1.6* 1.6*  PHOS 2.8  --   --   --   --   --   --   --   CALCIUM  8.3*   < >  --  8.3* 8.1* 7.9* 8.6* 8.6*   < > = values in this interval not displayed.     Recent Labs  Lab 01/09/24 0500 01/10/24 0513 01/10/24 1459 01/11/24 0652 01/12/24 0455 01/13/24 0356 01/14/24 0247 01/15/24 0438  CRP 20.8*  --  16.4* 15.7* 14.0* 12.9* 13.8*  --   PROCALCITON  --    < >  --  0.23 0.10 0.10 <0.10 <0.10  TSH 0.496  --   --   --   --   --   --   --   MG 1.8  --   --  1.4* 1.6* 1.8 1.6* 1.6*  CALCIUM  8.3*   < >  --  8.3* 8.1* 7.9* 8.6* 8.6*   < > = values in this interval not displayed.  ------------------------------------------------------------------------------------------------- Lab Results  Component Value Date   CHOL  74 03/06/2016   HDL 27 (L) 03/06/2016  LDLCALC 37 03/06/2016   TRIG 51 03/06/2016   CHOLHDL 2.7 03/06/2016    Lab Results  Component Value Date   HGBA1C 9.4 (H) 01/07/2024   No results for input(s): "TSH", "T4TOTAL", "FREET4", "T3FREE", "THYROIDAB" in the last 72 hours.  Radiology Reports  MR THORACIC SPINE WO CONTRAST Result Date: 01/13/2024 CLINICAL DATA:  Thoracic osteomyelitis EXAM: MRI THORACIC SPINE WITHOUT CONTRAST TECHNIQUE: Multiplanar, multisequence MR imaging of the thoracic spine was performed. No intravenous contrast was administered. COMPARISON:  01/07/2024 FINDINGS: The examination was terminated prior to completion by the patient. No axial images were obtained. Alignment: Physiologic. Vertebrae: Unchanged appearance at the T9-11 levels with ankylosis at the site of prior discitis-osteomyelitis. Unchanged chronic T12 compression deformity. No evidence of active discitis-osteomyelitis. Cord: Decreased size of dorsal collection of the lower thoracic spinal canal. Residual material measures approximately 3 mm thick, though assessment is limited in the absence of axial images and the absence of contrast. Paraspinal and other soft tissues: Negative Disc levels: No spinal canal stenosis. IMPRESSION: 1. The examination was terminated prior to completion by the patient. No axial images were obtained. 2. Decreased size of dorsal collection of the lower thoracic spinal canal. Residual material measures approximately 3 mm thick, though assessment is limited in the absence of axial images and contrast agent. 3. Unchanged appearance at the T9-11 levels with ankylosis at the site of prior discitis-osteomyelitis. No evidence of active discitis-osteomyelitis. Electronically Signed   By: Juanetta Nordmann M.D.   On: 01/13/2024 23:02     RADIOLOGY STUDIES/RESULTS: MR THORACIC SPINE WO CONTRAST Result Date: 01/13/2024 CLINICAL DATA:  Thoracic osteomyelitis EXAM: MRI THORACIC SPINE WITHOUT CONTRAST  TECHNIQUE: Multiplanar, multisequence MR imaging of the thoracic spine was performed. No intravenous contrast was administered. COMPARISON:  01/07/2024 FINDINGS: The examination was terminated prior to completion by the patient. No axial images were obtained. Alignment: Physiologic. Vertebrae: Unchanged appearance at the T9-11 levels with ankylosis at the site of prior discitis-osteomyelitis. Unchanged chronic T12 compression deformity. No evidence of active discitis-osteomyelitis. Cord: Decreased size of dorsal collection of the lower thoracic spinal canal. Residual material measures approximately 3 mm thick, though assessment is limited in the absence of axial images and the absence of contrast. Paraspinal and other soft tissues: Negative Disc levels: No spinal canal stenosis. IMPRESSION: 1. The examination was terminated prior to completion by the patient. No axial images were obtained. 2. Decreased size of dorsal collection of the lower thoracic spinal canal. Residual material measures approximately 3 mm thick, though assessment is limited in the absence of axial images and contrast agent. 3. Unchanged appearance at the T9-11 levels with ankylosis at the site of prior discitis-osteomyelitis. No evidence of active discitis-osteomyelitis. Electronically Signed   By: Juanetta Nordmann M.D.   On: 01/13/2024 23:02     LOS: 9 days   Signature  -    Lynnwood Sauer M.D on 01/15/2024 at 7:45 AM   -  To page go to [email or web address]

## 2024-01-15 NOTE — Plan of Care (Signed)

## 2024-01-15 NOTE — TOC Progression Note (Signed)
 Transition of Care [facility]) - Progression Note    Patient Details  Name: Brandon [name] MRN: [id number] Date of Birth: [birth year]  Transition of Care [facility] CM/SW Contact  Jannice Mends, [place] Phone Number: 01/15/2024, 10:41 AM  Clinical Narrative:    CSW notified [facility] of likely discharge on Saturday with IV antibiotics. Weekend TOC to contact [place] at [facility] [phone].   Expected Discharge Plan: Skilled Nursing Facility Barriers to Discharge: Continued Medical Work up  Expected Discharge Plan and Services In-house Referral: Clinical Social Work   Post Acute Care Choice: Skilled Nursing Facility Living arrangements for the past 2 months: Skilled Nursing Facility                                       Social Determinants of Health (SDOH) Interventions SDOH Screenings   Food Insecurity: Patient Unable To Answer (01/08/2024)  Housing: Patient Declined (01/08/2024)  Transportation Needs: Patient Declined (01/08/2024)  Utilities: Patient Declined (01/08/2024)  Physical Activity: Inactive (11/15/2021)  Stress: No Stress Concern Present (10/31/2021)  Tobacco Use: High Risk (01/06/2024)    Readmission Risk Interventions    01/08/2024   11:12 AM  Readmission Risk Prevention Plan  Transportation Screening Complete  Medication Review (RN Care Manager) Complete  PCP or Specialist appointment within 3-5 days of discharge Complete  HRI or Home Care Consult Complete  SW Recovery Care/Counseling Consult Complete  Palliative Care Screening Not Applicable  Skilled Nursing Facility Complete

## 2024-01-15 NOTE — Progress Notes (Signed)
 Physical Therapy Treatment Patient Details Name: Brandon [name] MRN: [id number] DOB: Dec 29, 1965 Today's Date: 01/15/2024   History of Present Illness 58 y.o. male brought to ED 4/15 from his skilled nursing facility due to worsening lower back pain. 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 PMHx: CVA with residual right-sided hemiplegia, Carotid artery disease s/p L carotid stent, SAH, IPH, On seizure prophylaxis, CAD with PCI/stent RCA, diabetes type 2, hx pancreatitis with pancreatic insufficiency, bilateral renal artery stenosis, hypertension, malnutrition, BPH, GERD, mood disorder.    PT Comments  Still quite limited by increase in back and LE pain with strong spasticity of LEs when attempting to rise to EOB. He did tolerate sidelying position with back supported and HOB elevated to 45 degrees, assisted to eat and drink a bit but only a few small bites. Unable to volitionally move LEs without max multimodal cues and assist. Patient will benefit from continued inpatient follow up therapy, <3 hours/day. Patient will continue to benefit from skilled physical therapy services to further improve independence with functional mobility.     If plan is discharge home, recommend the following: Two people to help with walking and/or transfers;Two people to help with bathing/dressing/bathroom;Assistance with cooking/housework;Supervision due to cognitive status;Assist for transportation;Direct supervision/assist for financial management;Direct supervision/assist for medications management;Assistance with feeding   Can travel by private vehicle     No  Equipment Recommendations  None recommended by PT    Recommendations for Other Services       Precautions / Restrictions Precautions Precautions: Fall Recall of Precautions/Restrictions: Impaired Precaution/Restrictions Comments: Back for comfort Restrictions Weight  Bearing Restrictions Per Provider Order: No     Mobility  Bed Mobility Overal bed mobility: Needs Assistance Bed Mobility: Rolling, Sidelying to Sit, Sit to Sidelying Rolling: Used rails, Mod assist Sidelying to sit: HOB elevated, Total assist     Sit to sidelying: Max assist, HOB elevated General bed mobility comments: Mod assist to roll towards Rt side from 1/4 sidelying position already, demonstrates improved ability to pull through RUE, and reaches with LUE with cues but unable to grasp rail. Used HOB and total assist to rise to 45 degrees but becomes agitated and spastic of LEs when attempting to rise above this with help. Max assist to return to sidelying. Pillow to help 1/4 turn again.    Transfers                   General transfer comment: declined    Ambulation/Gait                   Stairs             Wheelchair Mobility     Tilt Bed    Modified Rankin (Stroke Patients Only)       Balance Overall balance assessment:  (Only able to sit with HOB at 45 degrees in sidelying.)                                          Communication Communication Communication: No apparent difficulties  Cognition Arousal: Alert Behavior During Therapy: Flat affect   PT - Cognitive impairments: No family/caregiver present to determine baseline, Initiation, Sequencing, Problem solving, Safety/Judgement  Following commands: Impaired Following commands impaired: Follows one step commands inconsistently, Follows one step commands with increased time    Cueing Cueing Techniques: Verbal cues, Tactile cues, Gestural cues  Exercises Other Exercises Other Exercises: Declined to attempt LAQ on EOB. Other Exercises: Increased pain and spasticity when attempting to extend legs today.    General Comments        Pertinent Vitals/Pain Pain Assessment Pain Assessment: Faces Faces Pain Scale: Hurts even  more Pain Location: back and legs with movement. Pain Descriptors / Indicators: Grimacing, Guarding    Home Living                          Prior Function            PT Goals (current goals can now be found in the care plan section) Acute Rehab PT Goals Patient Stated Goal: Go outside to smoke a cigarette PT Goal Formulation: With patient Time For Goal Achievement: 01/22/24 Potential to Achieve Goals: Poor Progress towards PT goals: Progressing toward goals    Frequency    Min 1X/week      PT Plan      Co-evaluation              AM-PAC PT "6 Clicks" Mobility   Outcome Measure  Help needed turning from your back to your side while in a flat bed without using bedrails?: A Lot Help needed moving from lying on your back to sitting on the side of a flat bed without using bedrails?: Total Help needed moving to and from a bed to a chair (including a wheelchair)?: Total Help needed standing up from a chair using your arms (e.g., wheelchair or bedside chair)?: Total Help needed to walk in hospital room?: Total Help needed climbing 3-5 steps with a railing? : Total 6 Click Score: 7    End of Session   Activity Tolerance: Other (comment);Patient limited by pain (Self limited, confusion.) Patient left: in bed;with call bell/phone within reach;with bed alarm set Nurse Communication:  (Requested secretary call for prevalon boots (ordered 4/21, not located in room)) PT Visit Diagnosis: Muscle weakness (generalized) (M62.81);Difficulty in walking, not elsewhere classified (R26.2);Other symptoms and signs involving the nervous system (R29.898);Pain Pain - Right/Left:  (bil) Pain - part of body:  (back and BIL LEs)     Time: 1610-9604 PT Time Calculation (min) (ACUTE ONLY): 20 min  Charges:    $Therapeutic Activity: 8-22 mins PT General Charges $$ ACUTE PT VISIT: 1 Visit                     Jory Ng, PT, DPT [facility]  Rehabilitation  Services Physical Therapist Office: [phone] Website: [email or web address]    Alinda Irani 01/15/2024, 4:30 PM

## 2024-01-16 DIAGNOSIS — G062 Extradural and subdural abscess, unspecified: Secondary | ICD-10-CM | POA: Diagnosis not present

## 2024-01-16 LAB — GLUCOSE, CAPILLARY
Glucose-Capillary: 162 mg/dL — ABNORMAL HIGH (ref 70–99)
Glucose-Capillary: 67 mg/dL — ABNORMAL LOW (ref 70–99)
Glucose-Capillary: 74 mg/dL (ref 70–99)
Glucose-Capillary: 126 mg/dL — ABNORMAL HIGH (ref 70–99)
Glucose-Capillary: 62 mg/dL — ABNORMAL LOW (ref 70–99)
Glucose-Capillary: 160 mg/dL — ABNORMAL HIGH (ref 70–99)
Glucose-Capillary: 284 mg/dL — ABNORMAL HIGH (ref 70–99)
Glucose-Capillary: 188 mg/dL — ABNORMAL HIGH (ref 70–99)
Glucose-Capillary: 147 mg/dL — ABNORMAL HIGH (ref 70–99)

## 2024-01-16 MED ORDER — LACTATED RINGERS IV BOLUS
1000.0000 mL | Freq: Once | INTRAVENOUS | Status: AC
  Administered 2024-01-16: 1000 mL via INTRAVENOUS

## 2024-01-16 MED ORDER — INSULIN GLARGINE-YFGN 100 UNIT/ML ~~LOC~~ SOLN
25.0000 [IU] | Freq: Every day | SUBCUTANEOUS | Status: DC
  Administered 2024-01-16: 25 [IU] via SUBCUTANEOUS
  Filled 2024-01-16 (×2): qty 0.25

## 2024-01-16 MED ORDER — HYDROMORPHONE HCL 1 MG/ML IJ SOLN
0.5000 mg | Freq: Three times a day (TID) | INTRAMUSCULAR | Status: DC | PRN
  Administered 2024-01-16 – 2024-01-18 (×3): 0.5 mg via INTRAVENOUS
  Filled 2024-01-16 (×3): qty 0.5

## 2024-01-16 MED ORDER — MIDODRINE HCL 5 MG PO TABS
10.0000 mg | ORAL_TABLET | Freq: Three times a day (TID) | ORAL | Status: DC
  Administered 2024-01-16 – 2024-01-17 (×4): 10 mg via ORAL
  Filled 2024-01-16 (×4): qty 2

## 2024-01-16 MED ORDER — INSULIN ASPART 100 UNIT/ML IJ SOLN
0.0000 [IU] | Freq: Three times a day (TID) | INTRAMUSCULAR | Status: DC
  Administered 2024-01-16: 2 [IU] via SUBCUTANEOUS
  Administered 2024-01-17: 1 [IU] via SUBCUTANEOUS
  Administered 2024-01-18: 3 [IU] via SUBCUTANEOUS
  Administered 2024-01-18: 2 [IU] via SUBCUTANEOUS
  Administered 2024-01-19 (×2): 3 [IU] via SUBCUTANEOUS
  Administered 2024-01-20: 7 [IU] via SUBCUTANEOUS
  Administered 2024-01-20: 3 [IU] via SUBCUTANEOUS
  Administered 2024-01-20: 2 [IU] via SUBCUTANEOUS
  Administered 2024-01-21: 7 [IU] via SUBCUTANEOUS
  Administered 2024-01-21: 5 [IU] via SUBCUTANEOUS
  Administered 2024-01-21: 2 [IU] via SUBCUTANEOUS

## 2024-01-16 MED ORDER — TRAZODONE HCL 50 MG PO TABS
100.0000 mg | ORAL_TABLET | Freq: Every day | ORAL | Status: DC
  Administered 2024-01-16 – 2024-01-20 (×5): 100 mg via ORAL
  Filled 2024-01-16 (×5): qty 2

## 2024-01-16 MED ORDER — INSULIN ASPART 100 UNIT/ML IJ SOLN: 0.0000 [IU] | Freq: Every day | INTRAMUSCULAR | Status: DC

## 2024-01-16 NOTE — Progress Notes (Signed)
 PROGRESS NOTE        PATIENT DETAILS Name: Brandon [name] Age: 58 y.o. Sex: male Date of Birth: 1966/08/04 Admit Date: 01/06/2024 Admitting Physician Arnulfo Larch, MD ZOX:[id number], [place] Kugel, FNP  Brief Summary: Patient is a 58 y.o.  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  Significant events: 4/15>> low back pain-emphysematous cystitis/gas in pelvis-admit to [facility]. 4/16>> MRI T-spine/L-spine with epidural abscess from T10-11 to L5-S1.  Significant studies: 4/15>> CT abdomen/pelvis: Extensive emphysematous cystitis-with mild extravesical extension of gas along both pelvic sidewalls, scattered abnormal gas in the spinal canal and from T12-L5 level. 4/16>> MRI C-spine: No evidence of discitis/abscess 4/16>> MRI thoracic/lumbar spine: Epidural abscess spanning T10-11 to L5-S1, disc signal at L4-5 and L5-S1.  Lower cord/cauda equina is diffusely compressed by collection.  4/18 >> TTE 1. Left ventricular ejection fraction, by estimation, is 65 to 70%. The left ventricle has normal function. There is mild left ventricular hypertrophy. Left ventricular diastolic parameters were normal.  2. Right ventricular systolic function is normal. The right ventricular size is normal.  3. The mitral valve is normal in structure. No evidence of mitral valve regurgitation.  4. AV is thickened, moderately calcified with no significant stenosis.. The aortic valve is tricuspid. Aortic valve regurgitation is not visualized. Aortic valve sclerosis/calcification is present, without any evidence of aortic  stenosis.  4/22.  PICC line.  4/22.  MRI T-spine repeat - 1. The examination was terminated prior to completion by the patient. No axial images were obtained. 2. Decreased size of dorsal collection of the lower thoracic spinal canal. Residual material measures approximately 3 mm thick, though assessment is limited in the absence of axial images and contrast agent. 3. Unchanged appearance at the T9-11 levels with ankylosis at the site of prior discitis-osteomyelitis. No evidence of active discitis-osteomyelitis.   Significant microbiology data: 4/15>> blood culture: Strep pneumo/ESBL Klebsiella pneumoniae 4/17>> blood culture: No growth.  Procedures:  Consults: ID Neurosurgery Cardiology. Urology  Subjective: Patient in bed, appears comfortable, denies any headache, no fever, no chest pain or pressure, no shortness of breath , no abdominal pain. No focal weakness.  Objective: Vitals: Blood pressure 114/88, pulse 81, temperature 98.7 F (37.1 C), resp. rate 14, SpO2 97%.   Exam:  Frail and cachectic, middle-aged white male, awake Alert, No new F.N deficits, chronic left-sided hemiparesis from previous stroke, condom catheter in place, right arm PICC line, Bearden.AT,PERRAL Supple Neck, No JVD,   Symmetrical Chest wall movement, Good air movement bilaterally, CTAB RRR,No Gallops, Rubs or new Murmurs,  +ve B.Sounds, Abd Soft, No tenderness,   No Cyanosis, Clubbing or edema    Assessment/Plan:  Sepsis secondary to polymicrobial bacteremia with streptococci/Klebsiella pneumonia-along with emphysematous cystitis with T10-11 to L5-S1 epidural abscess with resultant cord compression Sepsis physiology gradually improving Uncooperative-but appears to have significant pain/weakness in bilateral lower extremities compared to upper  Has been seen by both IR and neurosurgeon Dr. Ellery Guthrie, not a candidate for any procedure per both IR and neurosurgery.  Repeat MRI T-spine on 01/13/2024 although  limited as testing was aborted by the patient midway appears stable. Evaluated by urology-apart from antibiotics - possible Foley catheter placement (patient refused) and outpatient cystoscopy-no further inpatient recommendations. Continue meropenem , Repeat blood cultures negative so far TTE done on 4/18-appears nonacute Case discussed with ID, he will get a PICC line with 8 weeks of IV Meropenam stop date 03/04/24 .  PICC line placed 01/13/2024.  Postdischarge follow-up with neurosurgery, ID and urology.  SVT with aberrancy [name] in sinus, TSH stable, echo stable, beta-blocker adjusted on 01/13/2024  Acute metabolic encephalopathy Secondary to sepsis physiology Improved-but easily agitated this morning.  Answer simple questions appropriately.  Hyponatremia.  SIADH, failed fluid restriction and Lasix , trial of Samsca  on 01/13/2024  Hypokalemia/hypomagnesemia  - Repleted  Transaminitis Suspect this is sepsis/shock liver Downtrending-continue to follow-if worsens then will initiate further workup.  History of CVA with significant left-sided hemiparesis Difficult exam but seems to have significant paraparesis/pain issues from cord compression due to epidural abscess Per patient he apparently was using a quad cane-however per neurosurgery-patient has been nonambulatory for several months.  CAD-s/p PCI in 2016 No anginal symptoms Since no plans by IR to aspirate epidural abscess-resume Plavix .  HTN - B. Blocker adjusted further on 01/13/24  History of seizure disorder  Keppra   History of chronic pancreatitis Presumably from prior history of EtOH use Continue Creon   BPH  Flomax   History of EtOH use Apparently not drinkiNG anymore-SNF resident Will watch for withdrawal symptoms  History of tobacco abuse - counseled to quit  DM-2 (A1c 9.4 on 4/16) with uncontrolled hyperglycemia CBG running low, insulin  regimen adjusted further on 01/16/2024  CBG (last 3)  Recent Labs     01/15/24 2119 01/16/24 0105 01/16/24 0817  GLUCAP 106* 162* 67*    Lab Results  Component Value Date   HGBA1C 9.4 (H) 01/07/2024      Code status:   Code Status: Full Code   DVT Prophylaxis: SCDs Start: 01/06/24 2054   Family Communication: Sister-[name] Justice-[phone] left VM 4/17,4/18   Disposition Plan: Status is: Inpatient Remains inpatient appropriate because: Severity of illness   Planned Discharge Destination:Skilled nursing facility   Diet: Diet Order             Diet heart healthy/carb modified Fluid consistency: Thin; Fluid restriction: 1200 mL Fluid  Diet effective now                    MEDICATIONS: Scheduled Meds:  busPIRone   10 mg Oral BID   Chlorhexidine  Gluconate Cloth  6 each Topical Daily   clopidogrel   75 mg Oral Daily   famotidine   20 mg Oral BID   insulin  aspart  0-5 Units Subcutaneous QHS   insulin  aspart  0-9 Units Subcutaneous TID WC   insulin  glargine-yfgn  25 Units Subcutaneous Daily   levETIRAcetam   500 mg Oral BID   lipase/protease/amylase  36,000 Units Oral TID WC   metoprolol  tartrate  25 mg Oral BID   midodrine   5 mg Oral TID WC   nicotine   14 mg Transdermal Daily   polyethylene glycol  17 g Oral Daily   rosuvastatin   5 mg Oral QHS   senna-docusate  2 tablet Oral QHS   sodium chloride  flush  3 mL Intravenous Q12H   tamsulosin   0.4 mg Oral QPC supper   traZODone   150 mg Oral QHS   Continuous Infusions:  meropenem  (MERREM ) IV 1 g (01/16/24 0554)   PRN Meds:.acetaminophen , albuterol , dextrose , HYDROmorphone  (DILAUDID ) injection, LORazepam , metoprolol   tartrate, oxyCODONE  **OR** oxyCODONE , sodium chloride  flush   I have personally reviewed following labs and imaging studies  LABORATORY DATA: Recent Labs  Lab 01/11/24 0652 01/12/24 0455 01/13/24 0356 01/14/24 0247 01/15/24 0438  WBC 15.5* 14.3* 15.7* 12.9* 13.4*  HGB 10.5* 10.3* 9.4* 9.6* 9.4*  HCT 31.2* 31.3* 27.7* 29.7* 29.6*  PLT 531* 546* 578* 613* 638*   MCV 90.7 92.1 89.9 94.0 94.6  MCH 30.5 30.3 30.5 30.4 30.0  MCHC 33.7 32.9 33.9 32.3 31.8  RDW 15.2 15.2 15.2 15.5 15.6*  LYMPHSABS 1.1 1.3 1.1 1.2 1.3  MONOABS 0.9 0.9 0.8 0.8 0.8  EOSABS 0.0 0.1 0.0 0.0 0.0  BASOSABS 0.0 0.0 0.0 0.0 0.0    Recent Labs  Lab 01/10/24 1459 01/11/24 0652 01/12/24 0455 01/13/24 0356 01/14/24 0247 01/15/24 0438  NA  --  129* 129* 127* 138 132*  K  --  4.2 3.4* 4.3 3.8 3.8  CL  --  96* 93* 94* 102 97*  CO2  --  25 27 25 27 25   ANIONGAP  --  8 9 8 9 10   GLUCOSE  --  386* 114* 91 122* 93  BUN  --  11 9 9 7 7   CREATININE  --  0.53* 0.46* 0.56* 0.44* 0.40*  AST  --  132* 86* 112* 103* 61*  ALT  --  91* 71* 78* 82* 61*  ALKPHOS  --  285* 213* 242* 262* 230*  BILITOT  --  0.7 0.7 0.7 0.6 0.6  ALBUMIN  --  1.5* 1.5* <1.5* <1.5* <1.5*  CRP 16.4* 15.7* 14.0* 12.9* 13.8*  --   PROCALCITON  --  0.23 0.10 0.10 <0.10 <0.10  MG  --  1.4* 1.6* 1.8 1.6* 1.6*  CALCIUM   --  8.3* 8.1* 7.9* 8.6* 8.6*     Recent Labs  Lab 01/10/24 1459 01/11/24 0652 01/12/24 0455 01/13/24 0356 01/14/24 0247 01/15/24 0438  CRP 16.4* 15.7* 14.0* 12.9* 13.8*  --   PROCALCITON  --  0.23 0.10 0.10 <0.10 <0.10  MG  --  1.4* 1.6* 1.8 1.6* 1.6*  CALCIUM   --  8.3* 8.1* 7.9* 8.6* 8.6*  ------------------------------------------------------------------------------------------------- Lab Results  Component Value Date   CHOL 74 03/06/2016   HDL 27 (L) 03/06/2016   LDLCALC 37 03/06/2016   TRIG 51 03/06/2016   CHOLHDL 2.7 03/06/2016    Lab Results  Component Value Date   HGBA1C 9.4 (H) 01/07/2024   No results for input(s): "TSH", "T4TOTAL", "FREET4", "T3FREE", "THYROIDAB" in the last 72 hours.  Radiology Reports  No results found.    RADIOLOGY STUDIES/RESULTS: No results found.    LOS: 10 days   Signature  -    Lynnwood Sauer M.D on 01/16/2024 at 8:29 AM   -  To page go to [email or web address]

## 2024-01-16 NOTE — TOC Progression Note (Signed)
 Transition of Care [facility]) - Progression Note    Patient Details  Name: Brandon [name] MRN: [id number] Date of Birth: [birth year]  [facility] CM/SW Contact  Eusebio High, RN Phone Number: 01/16/2024, 11:05 AM  Clinical Narrative:     RNCM notified CSW that patient should dc tomorrow per Provider     Expected Discharge Plan: Skilled Nursing Facility Barriers to Discharge: Continued Medical Work up  Expected Discharge Plan and Services In-house Referral: Clinical Social Work   Post Acute Care Choice: Skilled Nursing Facility Living arrangements for the past 2 months: Skilled Nursing Facility                                       Social Determinants of Health (SDOH) Interventions SDOH Screenings   Food Insecurity: Patient Unable To Answer (01/08/2024)  Housing: Patient Declined (01/08/2024)  Transportation Needs: Patient Declined (01/08/2024)  Utilities: Patient Declined (01/08/2024)  Physical Activity: Inactive (11/15/2021)  Stress: No Stress Concern Present (10/31/2021)  Tobacco Use: High Risk (01/06/2024)    Readmission Risk Interventions    01/08/2024   11:12 AM  Readmission Risk Prevention Plan  Transportation Screening Complete  Medication Review (RN Care Manager) Complete  PCP or Specialist appointment within 3-5 days of discharge Complete  HRI or Home Care Consult Complete  SW Recovery Care/Counseling Consult Complete  Palliative Care Screening Not Applicable  Skilled Nursing Facility Complete

## 2024-01-16 NOTE — Progress Notes (Signed)
 Occupational Therapy Treatment Patient Details Name: Brandon [name] MRN: [id number] DOB: [birth year] Today's Date: 01/16/2024   History of present illness 58 y.o. male brought to ED 4/15 from his skilled nursing facility due to worsening lower back pain. 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 PMHx: CVA with residual right-sided hemiplegia, Carotid artery disease s/p L carotid stent, SAH, IPH, On seizure prophylaxis, CAD with PCI/stent RCA, diabetes type 2, hx pancreatitis with pancreatic insufficiency, bilateral renal artery stenosis, hypertension, malnutrition, BPH, GERD, mood disorder.   OT comments  Pt with slower progress towards goals due to pain limitations. Pain meds provided during session and pt agreeable to participate w/ therapies. Pt requires +2 assist for bed mobility and able to tolerate sitting EOB approximately 5 minutes before requesting to return to supine. Repositioned in sidelying and educated on alternative pain mgmt techniques with BLE spasms. Encouraged consideration of OOB transfer, even if via maximove, in next session as pt has not been OOB since admission.       If plan is discharge home, recommend the following:  Two people to help with walking and/or transfers;Two people to help with bathing/dressing/bathroom;Assistance with feeding;Direct supervision/assist for medications management;Assist for transportation   Equipment Recommendations  None recommended by OT    Recommendations for Other Services      Precautions / Restrictions Precautions Precautions: Fall Recall of Precautions/Restrictions: Impaired Precaution/Restrictions Comments: Back for comfort Restrictions Weight Bearing Restrictions Per Provider Order: No       Mobility Bed Mobility Overal bed mobility: Needs Assistance Bed Mobility: Rolling, Sidelying to Sit, Sit to Sidelying Rolling: Mod assist, Used  rails Sidelying to sit: Max assist, +2 for physical assistance, +2 for safety/equipment, Used rails     Sit to sidelying: Mod assist, +2 for physical assistance, +2 for safety/equipment General bed mobility comments: pt able to assist some in reaching to bedrail. +2 for sitting EOB with assist for BLE and lifting trunk. Pt attempting to lay down onto R elbow back to bed multiple times, assist for BLE back into bed    Transfers                         Balance Overall balance assessment: Needs assistance Sitting-balance support: Bilateral upper extremity supported, Feet supported Sitting balance-Leahy Scale: Poor Sitting balance - Comments: posterior/R lateral lean. Overall Mod-Max A Postural control: Posterior lean, Right lateral lean                                 ADL either performed or assessed with clinical judgement   ADL Overall ADL's : Needs assistance/impaired                                       General ADL Comments: Emphasis on EOB attempts, positioning and pain mgmt techniques with breathing and stretching    Extremity/Trunk Assessment Upper Extremity Assessment Upper Extremity Assessment: Right hand dominant;LUE deficits/detail;RUE deficits/detail RUE Deficits / Details: impaired coordination LUE Deficits / Details: edema improved, decreased use of this UE LUE Coordination: decreased fine motor;decreased gross motor   Lower Extremity Assessment Lower Extremity Assessment: Defer to PT evaluation        Vision   Vision Assessment?: No apparent visual deficits   Perception  Praxis     Communication Communication Communication: No apparent difficulties   Cognition Arousal: Alert Behavior During Therapy: Flat affect Cognition: No family/caregiver present to determine baseline             OT - Cognition Comments: flat, depressed affect. cues for problem solving and initiation needed. cues/encouragement to  participate                 Following commands: Impaired Following commands impaired: Only follows one step commands consistently, Follows one step commands with increased time, Follows multi-step commands inconsistently      Cueing   Cueing Techniques: Verbal cues, Tactile cues, Gestural cues  Exercises Exercises: Other exercises Other Exercises Other Exercises: AAROM hip and knee flexion bed level    Shoulder Instructions       General Comments      Pertinent Vitals/ Pain       Pain Assessment Pain Assessment: Faces Faces Pain Scale: Hurts even more Pain Location: BLE spasms, "all over" Pain Descriptors / Indicators: Grimacing, Guarding Pain Intervention(s): Monitored during session, Limited activity within patient's tolerance, RN gave pain meds during session  Home Living                                          Prior Functioning/Environment              Frequency  Min 1X/week        Progress Toward Goals  OT Goals(current goals can now be found in the care plan section)  Progress towards OT goals: OT to reassess next treatment  Acute Rehab OT Goals OT Goal Formulation: Patient unable to participate in goal setting Time For Goal Achievement: 01/23/24 Potential to Achieve Goals: Fair ADL Goals Additional ADL Goal #1: Patient will tolerate BUE P/AAROM x 10 reps at all joints with minimal pain/discomfort. Additional ADL Goal #2: Patient will wash face using his RUE/hand with min A in supported sitting  Plan      Co-evaluation                 AM-PAC OT "6 Clicks" Daily Activity     Outcome Measure   Help from another person eating meals?: A Lot Help from another person taking care of personal grooming?: A Lot Help from another person toileting, which includes using toliet, bedpan, or urinal?: Total Help from another person bathing (including washing, rinsing, drying)?: Total Help from another person to put on and taking  off regular upper body clothing?: A Lot Help from another person to put on and taking off regular lower body clothing?: Total 6 Click Score: 9    End of Session    OT Visit Diagnosis: Other symptoms and signs involving the nervous system (R29.898);Hemiplegia and hemiparesis;Pain   Activity Tolerance Patient limited by pain   Patient Left in bed;with call bell/phone within reach;with bed alarm set   Nurse Communication Mobility status;Other (comment) (does better with finger foods)        Time: 1610-9604 OT Time Calculation (min): 33 min  Charges: OT General Charges $OT Visit: 1 Visit OT Treatments $Therapeutic Activity: 23-37 mins  Lawrence Pretty, OTR/L Acute Rehab Services Office: [phone]   Annabella Barr 01/16/2024, 11:16 AM

## 2024-01-16 NOTE — Plan of Care (Signed)
  Problem: Education: Goal: Ability to describe self-care measures that may prevent or decrease complications (Diabetes Survival Skills Education) will improve Outcome: Progressing Goal: Individualized Educational Video(s) Outcome: Progressing   Problem: Metabolic: Goal: Ability to maintain appropriate glucose levels will improve Outcome: Progressing   Problem: Nutritional: Goal: Maintenance of adequate nutrition will improve Outcome: Progressing

## 2024-01-17 DIAGNOSIS — G062 Extradural and subdural abscess, unspecified: Secondary | ICD-10-CM | POA: Diagnosis not present

## 2024-01-17 LAB — GLUCOSE, CAPILLARY
Glucose-Capillary: 76 mg/dL (ref 70–99)
Glucose-Capillary: 131 mg/dL — ABNORMAL HIGH (ref 70–99)
Glucose-Capillary: 116 mg/dL — ABNORMAL HIGH (ref 70–99)
Glucose-Capillary: 73 mg/dL (ref 70–99)

## 2024-01-17 MED ORDER — INSULIN GLARGINE-YFGN 100 UNIT/ML ~~LOC~~ SOLN
20.0000 [IU] | Freq: Every day | SUBCUTANEOUS | Status: DC
  Filled 2024-01-17: qty 0.2

## 2024-01-17 MED ORDER — INSULIN GLARGINE-YFGN 100 UNIT/ML ~~LOC~~ SOLN
15.0000 [IU] | Freq: Every day | SUBCUTANEOUS | Status: DC
  Administered 2024-01-17: 15 [IU] via SUBCUTANEOUS
  Filled 2024-01-17 (×2): qty 0.15

## 2024-01-17 NOTE — Plan of Care (Signed)

## 2024-01-17 NOTE — Progress Notes (Signed)
 PROGRESS NOTE        PATIENT DETAILS Name: Brandon [name] Age: 58 y.o. Sex: male Date of Birth: [birth year] Admit Date: 01/06/2024 Admitting Physician Arnulfo Larch, MD [id number], [place] Kugel, FNP  Brief Summary: Patient is a 58 y.o.  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  Significant events: 4/15>> low back pain-emphysematous cystitis/gas in pelvis-admit to [facility]. 4/16>> MRI T-spine/L-spine with epidural abscess from T10-11 to L5-S1.  Significant studies: 4/15>> CT abdomen/pelvis: Extensive emphysematous cystitis-with mild extravesical extension of gas along both pelvic sidewalls, scattered abnormal gas in the spinal canal and from T12-L5 level. 4/16>> MRI C-spine: No evidence of discitis/abscess 4/16>> MRI thoracic/lumbar spine: Epidural abscess spanning T10-11 to L5-S1, disc signal at L4-5 and L5-S1.  Lower cord/cauda equina is diffusely compressed by collection.  4/18 >> TTE 1. Left ventricular ejection fraction, by estimation, is 65 to 70%. The left ventricle has normal function. There is mild left ventricular hypertrophy. Left ventricular diastolic parameters were normal.  2. Right ventricular systolic function is normal. The right ventricular size is normal.  3. The mitral valve is normal in structure. No evidence of mitral valve regurgitation.  4. AV is thickened, moderately calcified with no significant stenosis.. The aortic valve is tricuspid. Aortic valve regurgitation is not visualized. Aortic valve sclerosis/calcification is present, without any evidence of aortic  stenosis.  4/22.  PICC line.  4/22.  MRI T-spine repeat - 1. The examination was terminated prior to completion by the patient. No axial images were obtained. 2. Decreased size of dorsal collection of the lower thoracic spinal canal. Residual material measures approximately 3 mm thick, though assessment is limited in the absence of axial images and contrast agent. 3. Unchanged appearance at the T9-11 levels with ankylosis at the site of prior discitis-osteomyelitis. No evidence of active discitis-osteomyelitis.   Significant microbiology data: 4/15>> blood culture: Strep pneumo/ESBL Klebsiella pneumoniae 4/17>> blood culture: No growth.  Procedures:  Consults: ID Neurosurgery Cardiology. Urology  Subjective: Patient in bed, appears comfortable, denies any headache, no fever, no chest pain or pressure, no shortness of breath , no abdominal pain. No focal weakness.  Objective: Vitals: Blood pressure 112/84, pulse 97, temperature 98.3 F (36.8 C), temperature source Oral, resp. rate 20, SpO2 99%.   Exam:  Frail and cachectic, middle-aged white male, awake Alert, No new F.N deficits, chronic left-sided hemiparesis from previous stroke, condom catheter in place, right arm PICC line, [place].AT,PERRAL Supple Neck, No JVD,   Symmetrical Chest wall movement, Good air movement bilaterally, CTAB RRR,No Gallops, Rubs or new Murmurs,  +ve B.Sounds, Abd Soft, No tenderness,   No Cyanosis, Clubbing or edema    Assessment/Plan:  Sepsis secondary to polymicrobial bacteremia with streptococci/Klebsiella pneumonia-along with emphysematous cystitis with T10-11 to L5-S1 epidural abscess with resultant cord compression Sepsis physiology gradually improving Uncooperative-but appears to have significant pain/weakness in bilateral lower extremities compared to upper  Has been seen by both IR and neurosurgeon Dr. Ellery Guthrie, not a candidate for any procedure per both IR and neurosurgery.  Repeat MRI T-spine  on 01/13/2024 although limited as testing was aborted by the patient midway appears stable. Evaluated by urology-apart from antibiotics - possible Foley catheter placement (patient refused) and outpatient cystoscopy-no further inpatient recommendations. Continue meropenem , Repeat blood cultures negative so far TTE done on 4/18-appears nonacute Case discussed with ID, he will get a PICC line with 8 weeks of IV Meropenam stop date 03/04/24 .  PICC line placed 01/13/2024.  Postdischarge follow-up with neurosurgery, ID and urology.  SVT with aberrancy [name] in sinus, TSH stable, echo stable, beta-blocker adjusted on 01/13/2024  Acute metabolic encephalopathy Secondary to sepsis physiology Improved-but easily agitated this morning.  Answer simple questions appropriately.  Hyponatremia.  SIADH, failed fluid restriction and Lasix , trial of Samsca  on 01/13/2024  Hypokalemia/hypomagnesemia  - Repleted  Transaminitis Suspect this is sepsis/shock liver Downtrending-continue to follow-if worsens then will initiate further workup.  History of CVA with significant left-sided hemiparesis Difficult exam but seems to have significant paraparesis/pain issues from cord compression due to epidural abscess Per patient he apparently was using a quad cane-however per neurosurgery-patient has been nonambulatory for several months.  CAD-s/p PCI in 2016 No anginal symptoms Since no plans by IR to aspirate epidural abscess-resume Plavix .  HTN - B. Blocker adjusted further on 01/13/24  History of seizure disorder  Keppra   History of chronic pancreatitis Presumably from prior history of EtOH use Continue Creon   BPH  Flomax   History of EtOH use Apparently not drinkiNG anymore-SNF resident Will watch for withdrawal symptoms  History of tobacco abuse - counseled to quit  DM-2 (A1c 9.4 on 4/16) with uncontrolled hyperglycemia Patient has very erratic oral intake, blood sugars very labile and fluctuating,  insulin  adjusted multiple times, counseled on a more consistent oral intake.  CBG (last 3)  Recent Labs    01/16/24 1920 01/16/24 2112 01/17/24 0805  GLUCAP 188* 147* 76    Lab Results  Component Value Date   HGBA1C 9.4 (H) 01/07/2024      Code status:   Code Status: Full Code   DVT Prophylaxis: SCDs Start: 01/06/24 2054   Family Communication: Sister-[name] [phone] left VM 4/17,4/18   Disposition Plan: Status is: Inpatient Remains inpatient appropriate because: Severity of illness   Planned Discharge Destination:Skilled nursing facility   Diet: Diet Order             Diet regular Room service appropriate? Yes; Fluid consistency: Thin  Diet effective now                    MEDICATIONS: Scheduled Meds:  busPIRone   10 mg Oral BID   Chlorhexidine  Gluconate Cloth  6 each Topical Daily   clopidogrel   75 mg Oral Daily   famotidine   20 mg Oral BID   insulin  aspart  0-9 Units Subcutaneous TID WC   insulin  glargine-yfgn  15 Units Subcutaneous Daily   levETIRAcetam   500 mg Oral BID   lipase/protease/amylase  36,000 Units Oral TID WC   midodrine   10 mg Oral TID WC   nicotine   14 mg Transdermal Daily   polyethylene glycol  17 g Oral Daily   rosuvastatin   5 mg Oral QHS   senna-docusate  2 tablet Oral QHS   sodium chloride  flush  3 mL Intravenous Q12H   tamsulosin   0.4 mg Oral QPC supper   traZODone   100 mg Oral QHS   Continuous Infusions:  meropenem  (MERREM ) IV 1 g (01/17/24 0557)   PRN Meds:.acetaminophen , albuterol , dextrose , HYDROmorphone  (DILAUDID ) injection, metoprolol  tartrate, oxyCODONE  **OR** [DISCONTINUED] oxyCODONE ,  sodium chloride  flush   I have personally reviewed following labs and imaging studies  LABORATORY DATA: Recent Labs  Lab 01/11/24 0652 01/12/24 0455 01/13/24 0356 01/14/24 0247 01/15/24 0438  WBC 15.5* 14.3* 15.7* 12.9* 13.4*  HGB 10.5* 10.3* 9.4* 9.6* 9.4*  HCT 31.2* 31.3* 27.7* 29.7* 29.6*  PLT 531* 546* 578*  613* 638*  MCV 90.7 92.1 89.9 94.0 94.6  MCH 30.5 30.3 30.5 30.4 30.0  MCHC 33.7 32.9 33.9 32.3 31.8  RDW 15.2 15.2 15.2 15.5 15.6*  LYMPHSABS 1.1 1.3 1.1 1.2 1.3  MONOABS 0.9 0.9 0.8 0.8 0.8  EOSABS 0.0 0.1 0.0 0.0 0.0  BASOSABS 0.0 0.0 0.0 0.0 0.0    Recent Labs  Lab 01/10/24 1459 01/11/24 0652 01/12/24 0455 01/13/24 0356 01/14/24 0247 01/15/24 0438  NA  --  129* 129* 127* 138 132*  K  --  4.2 3.4* 4.3 3.8 3.8  CL  --  96* 93* 94* 102 97*  CO2  --  25 27 25 27 25   ANIONGAP  --  8 9 8 9 10   GLUCOSE  --  386* 114* 91 122* 93  BUN  --  11 9 9 7 7   CREATININE  --  0.53* 0.46* 0.56* 0.44* 0.40*  AST  --  132* 86* 112* 103* 61*  ALT  --  91* 71* 78* 82* 61*  ALKPHOS  --  285* 213* 242* 262* 230*  BILITOT  --  0.7 0.7 0.7 0.6 0.6  ALBUMIN  --  1.5* 1.5* <1.5* <1.5* <1.5*  CRP 16.4* 15.7* 14.0* 12.9* 13.8*  --   PROCALCITON  --  0.23 0.10 0.10 <0.10 <0.10  MG  --  1.4* 1.6* 1.8 1.6* 1.6*  CALCIUM   --  8.3* 8.1* 7.9* 8.6* 8.6*     Recent Labs  Lab 01/10/24 1459 01/11/24 0652 01/12/24 0455 01/13/24 0356 01/14/24 0247 01/15/24 0438  CRP 16.4* 15.7* 14.0* 12.9* 13.8*  --   PROCALCITON  --  0.23 0.10 0.10 <0.10 <0.10  MG  --  1.4* 1.6* 1.8 1.6* 1.6*  CALCIUM   --  8.3* 8.1* 7.9* 8.6* 8.6*  ------------------------------------------------------------------------------------------------- Lab Results  Component Value Date   CHOL 74 03/06/2016   HDL 27 (L) 03/06/2016   LDLCALC 37 03/06/2016   TRIG 51 03/06/2016   CHOLHDL 2.7 03/06/2016    Lab Results  Component Value Date   HGBA1C 9.4 (H) 01/07/2024   No results for input(s): "TSH", "T4TOTAL", "FREET4", "T3FREE", "THYROIDAB" in the last 72 hours.  Radiology Reports  No results found.    RADIOLOGY STUDIES/RESULTS: No results found.    LOS: 11 days   Signature  -    Lynnwood Sauer M.D on 01/17/2024 at 9:23 AM   -  To page go to [email or web address]

## 2024-01-17 NOTE — Plan of Care (Signed)

## 2024-01-17 NOTE — TOC Progression Note (Signed)
 Transition of Care [facility]) - Progression Note    Patient Details  Name: Brandon [name] MRN: [id number] Date of Birth: [birth year]  Transition of Care [facility]) CM/SW Contact  Adline Hook, [place] Phone Number: 01/17/2024, 10:48 AM  Clinical Narrative:     Patient has a bed at [facility]. Pt is not medically ready today.  Expected Discharge Plan: Skilled Nursing Facility Barriers to Discharge: Continued Medical Work up  Expected Discharge Plan and Services In-house Referral: Clinical Social Work   Post Acute Care Choice: Skilled Nursing Facility Living arrangements for the past 2 months: Skilled Nursing Facility                                       Social Determinants of Health (SDOH) Interventions SDOH Screenings   Food Insecurity: Patient Unable To Answer (01/08/2024)  Housing: Patient Declined (01/08/2024)  Transportation Needs: Patient Declined (01/08/2024)  Utilities: Patient Declined (01/08/2024)  Physical Activity: Inactive (11/15/2021)  Stress: No Stress Concern Present (10/31/2021)  Tobacco Use: High Risk (01/06/2024)    Readmission Risk Interventions    01/08/2024   11:12 AM  Readmission Risk Prevention Plan  Transportation Screening Complete  Medication Review (RN Care Manager) Complete  PCP or Specialist appointment within 3-5 days of discharge Complete  HRI or Home Care Consult Complete  SW Recovery Care/Counseling Consult Complete  Palliative Care Screening Not Applicable  Skilled Nursing Facility Complete

## 2024-01-18 DIAGNOSIS — G062 Extradural and subdural abscess, unspecified: Secondary | ICD-10-CM | POA: Diagnosis not present

## 2024-01-18 LAB — GLUCOSE, CAPILLARY
Glucose-Capillary: 50 mg/dL — ABNORMAL LOW (ref 70–99)
Glucose-Capillary: 108 mg/dL — ABNORMAL HIGH (ref 70–99)
Glucose-Capillary: 170 mg/dL — ABNORMAL HIGH (ref 70–99)
Glucose-Capillary: 220 mg/dL — ABNORMAL HIGH (ref 70–99)
Glucose-Capillary: 160 mg/dL — ABNORMAL HIGH (ref 70–99)

## 2024-01-18 MED ORDER — INSULIN GLARGINE-YFGN 100 UNIT/ML ~~LOC~~ SOLN
10.0000 [IU] | Freq: Every day | SUBCUTANEOUS | Status: DC
  Filled 2024-01-18: qty 0.1

## 2024-01-18 MED ORDER — MIDODRINE HCL 5 MG PO TABS
5.0000 mg | ORAL_TABLET | Freq: Three times a day (TID) | ORAL | Status: DC
  Administered 2024-01-18 (×3): 5 mg via ORAL
  Filled 2024-01-18 (×3): qty 1

## 2024-01-18 MED ORDER — LACTATED RINGERS IV SOLN: INTRAVENOUS | Status: AC

## 2024-01-18 NOTE — Plan of Care (Signed)
  Problem: Education: Goal: Ability to describe self-care measures that may prevent or decrease complications (Diabetes Survival Skills Education) will improve Outcome: Not Progressing   Problem: Skin Integrity: Goal: Risk for impaired skin integrity will decrease Outcome: Progressing   Problem: Nutritional: Goal: Maintenance of adequate nutrition will improve Outcome: Progressing   Problem: Activity: Goal: Risk for activity intolerance will decrease Outcome: Not Progressing

## 2024-01-18 NOTE — Progress Notes (Signed)
 PROGRESS NOTE        PATIENT DETAILS Name: Brandon [name] Age: 58 y.o. Sex: male Date of Birth: [birth year] Admit Date: 01/06/2024 Admitting Physician Arnulfo Larch, MD RUE:[id number], [place] Kugel, FNP  Brief Summary: Patient is a 58 y.o.  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  Significant events: 4/15>> low back pain-emphysematous cystitis/gas in pelvis-admit to TRH. 4/16>> MRI T-spine/L-spine with epidural abscess from T10-11 to L5-S1.  Significant studies: 4/15>> CT abdomen/pelvis: Extensive emphysematous cystitis-with mild extravesical extension of gas along both pelvic sidewalls, scattered abnormal gas in the spinal canal and from T12-L5 level. 4/16>> MRI C-spine: No evidence of discitis/abscess 4/16>> MRI thoracic/lumbar spine: Epidural abscess spanning T10-11 to L5-S1, disc signal at L4-5 and L5-S1.  Lower cord/cauda equina is diffusely compressed by collection.  4/18 >> TTE 1. Left ventricular ejection fraction, by estimation, is 65 to 70%. The left ventricle has normal function. There is mild left ventricular hypertrophy. Left ventricular diastolic parameters were normal.  2. Right ventricular systolic function is normal. The right ventricular size is normal.  3. The mitral valve is normal in structure. No evidence of mitral valve regurgitation.  4. AV is thickened, moderately calcified with no significant stenosis.. The aortic valve is tricuspid. Aortic valve regurgitation is not visualized. Aortic valve sclerosis/calcification is present, without any evidence of aortic  stenosis.  4/22.  PICC line.  4/22.  MRI T-spine repeat - 1. The examination was terminated prior to completion by the patient. No axial images were obtained. 2. Decreased size of dorsal collection of the lower thoracic spinal canal. Residual material measures approximately 3 mm thick, though assessment is limited in the absence of axial images and contrast agent. 3. Unchanged appearance at the T9-11 levels with ankylosis at the site of prior discitis-osteomyelitis. No evidence of active discitis-osteomyelitis.   Significant microbiology data: 4/15>> blood culture: Strep pneumo/ESBL Klebsiella pneumoniae 4/17>> blood culture: No growth.  Procedures:  Consults: ID Neurosurgery Cardiology. Urology  Subjective: Patient in bed, appears comfortable, denies any headache, no fever, no chest pain or pressure, no shortness of breath , no abdominal pain. No focal weakness.  Objective: Vitals: Blood pressure 125/78, pulse 99, temperature 98.2 F (36.8 C), temperature source Oral, resp. rate 16, SpO2 97%.   Exam:  Frail and cachectic, middle-aged white male, awake Alert, No new F.N deficits, chronic left-sided hemiparesis from previous stroke, condom catheter in place, right arm PICC line, [place].AT,PERRAL Supple Neck, No JVD,   Symmetrical Chest wall movement, Good air movement bilaterally, CTAB RRR,No Gallops, Rubs or new Murmurs,  +ve B.Sounds, Abd Soft, No tenderness,   No Cyanosis, Clubbing or edema    Assessment/Plan:  Sepsis secondary to polymicrobial bacteremia with streptococci/Klebsiella pneumonia-along with emphysematous cystitis with T10-11 to L5-S1 epidural abscess with resultant cord compression Sepsis physiology gradually improving IV meropenem  Uncooperative-but appears to have significant pain/weakness in bilateral lower extremities compared to upper  Has been seen by both IR and neurosurgeon Dr. Ellery Guthrie, not a candidate for any procedure per both IR and neurosurgery.  Repeat  MRI T-spine on 01/13/2024 although limited as testing was aborted by the patient midway appears stable. Evaluated by urology-apart from antibiotics - possible Foley catheter placement (patient refused) and outpatient cystoscopy-no further inpatient recommendations. Continue meropenem , Repeat blood cultures negative so far TTE done on 4/18-appears nonacute Case discussed with ID, he will get a PICC line with 8 weeks of IV Meropenam stop date 03/04/24 .  PICC line placed 01/13/2024.  Postdischarge follow-up with neurosurgery, ID and urology.  SVT with aberrancy [name] in sinus, TSH stable, echo stable, beta-blocker adjusted on 01/13/2024 as blood pressure is labile.  Acute metabolic encephalopathy Secondary to sepsis physiology Improved-but easily agitated this morning.  Answer simple questions appropriately.  Hyponatremia.  SIADH, failed fluid restriction and Lasix , trial of Samsca  on 01/13/2024  Hypokalemia/hypomagnesemia  - Repleted  Transaminitis Suspect this is sepsis/shock liver Downtrending-continue to follow-if worsens then will initiate further workup.  History of CVA with significant left-sided hemiparesis Difficult exam but seems to have significant paraparesis/pain issues from cord compression due to epidural abscess Per patient he apparently was using a quad cane-however per neurosurgery-patient has been nonambulatory for several months.  CAD-s/p PCI in 2016 No anginal symptoms Since no plans by IR to aspirate epidural abscess-resume Plavix .  HTN - B. Blocker adjusted further on 01/13/24  History of seizure disorder  Keppra   History of chronic pancreatitis Presumably from prior history of EtOH use Continue Creon   BPH  Flomax   History of EtOH use Apparently not drinkiNG anymore-SNF resident Will watch for withdrawal symptoms  History of tobacco abuse - counseled to quit  DM-2 (A1c 9.4 on 4/16) with uncontrolled hyperglycemia Patient has very erratic oral intake,  blood sugars very labile and fluctuating, insulin  adjusted multiple times, discontinue long-acting insulin  on 01/18/2024 and monitor  CBG (last 3)  Recent Labs    01/17/24 1545 01/17/24 1934 01/18/24 0808  GLUCAP 116* 73 50*    Lab Results  Component Value Date   HGBA1C 9.4 (H) 01/07/2024      Code status:   Code Status: Full Code   DVT Prophylaxis: SCDs Start: 01/06/24 2054   Family Communication: Sister-[name] [phone] left VM 4/17,4/18   Disposition Plan: Status is: Inpatient Remains inpatient appropriate because: Severity of illness   Planned Discharge Destination:Skilled nursing facility   Diet: Diet Order             Diet regular Room service appropriate? Yes; Fluid consistency: Thin  Diet effective now                    MEDICATIONS: Scheduled Meds:  busPIRone   10 mg Oral BID   Chlorhexidine  Gluconate Cloth  6 each Topical Daily   clopidogrel   75 mg Oral Daily   famotidine   20 mg Oral BID   insulin  aspart  0-9 Units Subcutaneous TID WC   levETIRAcetam   500 mg Oral BID   lipase/protease/amylase  36,000 Units Oral TID WC   midodrine   5 mg Oral TID WC   nicotine   14 mg Transdermal Daily   polyethylene glycol  17 g Oral Daily   rosuvastatin   5 mg Oral QHS   senna-docusate  2 tablet Oral QHS   sodium chloride  flush  3 mL Intravenous Q12H   tamsulosin   0.4 mg Oral QPC supper   traZODone   100 mg Oral QHS   Continuous Infusions:  lactated ringers      meropenem  (MERREM ) IV 1 g (01/18/24 0510)   PRN Meds:.acetaminophen , albuterol , dextrose , HYDROmorphone  (DILAUDID ) injection, metoprolol  tartrate,  oxyCODONE  **OR** [DISCONTINUED] oxyCODONE , sodium chloride  flush   I have personally reviewed following labs and imaging studies  LABORATORY DATA: Recent Labs  Lab 01/12/24 0455 01/13/24 0356 01/14/24 0247 01/15/24 0438  WBC 14.3* 15.7* 12.9* 13.4*  HGB 10.3* 9.4* 9.6* 9.4*  HCT 31.3* 27.7* 29.7* 29.6*  PLT 546* 578* 613* 638*  MCV 92.1  89.9 94.0 94.6  MCH 30.3 30.5 30.4 30.0  MCHC 32.9 33.9 32.3 31.8  RDW 15.2 15.2 15.5 15.6*  LYMPHSABS 1.3 1.1 1.2 1.3  MONOABS 0.9 0.8 0.8 0.8  EOSABS 0.1 0.0 0.0 0.0  BASOSABS 0.0 0.0 0.0 0.0    Recent Labs  Lab 01/12/24 0455 01/13/24 0356 01/14/24 0247 01/15/24 0438  NA 129* 127* 138 132*  K 3.4* 4.3 3.8 3.8  CL 93* 94* 102 97*  CO2 27 25 27 25   ANIONGAP 9 8 9 10   GLUCOSE 114* 91 122* 93  BUN 9 9 7 7   CREATININE 0.46* 0.56* 0.44* 0.40*  AST 86* 112* 103* 61*  ALT 71* 78* 82* 61*  ALKPHOS 213* 242* 262* 230*  BILITOT 0.7 0.7 0.6 0.6  ALBUMIN 1.5* <1.5* <1.5* <1.5*  CRP 14.0* 12.9* 13.8*  --   PROCALCITON 0.10 0.10 <0.10 <0.10  MG 1.6* 1.8 1.6* 1.6*  CALCIUM  8.1* 7.9* 8.6* 8.6*     Recent Labs  Lab 01/12/24 0455 01/13/24 0356 01/14/24 0247 01/15/24 0438  CRP 14.0* 12.9* 13.8*  --   PROCALCITON 0.10 0.10 <0.10 <0.10  MG 1.6* 1.8 1.6* 1.6*  CALCIUM  8.1* 7.9* 8.6* 8.6*  ------------------------------------------------------------------------------------------------- Lab Results  Component Value Date   CHOL 74 03/06/2016   HDL 27 (L) 03/06/2016   LDLCALC 37 03/06/2016   TRIG 51 03/06/2016   CHOLHDL 2.7 03/06/2016    Lab Results  Component Value Date   HGBA1C 9.4 (H) 01/07/2024   No results for input(s): "TSH", "T4TOTAL", "FREET4", "T3FREE", "THYROIDAB" in the last 72 hours.  Radiology Reports  No results found.    RADIOLOGY STUDIES/RESULTS: No results found.    LOS: 12 days   Signature  -    Lynnwood Sauer M.D on 01/18/2024 at 8:33 AM   -  To page go to [email or web address]

## 2024-01-19 DIAGNOSIS — G062 Extradural and subdural abscess, unspecified: Secondary | ICD-10-CM | POA: Diagnosis not present

## 2024-01-19 LAB — GLUCOSE, CAPILLARY
Glucose-Capillary: 119 mg/dL — ABNORMAL HIGH (ref 70–99)
Glucose-Capillary: 205 mg/dL — ABNORMAL HIGH (ref 70–99)
Glucose-Capillary: 232 mg/dL — ABNORMAL HIGH (ref 70–99)
Glucose-Capillary: 180 mg/dL — ABNORMAL HIGH (ref 70–99)

## 2024-01-19 LAB — MRSA NEXT GEN BY PCR, NASAL: MRSA by PCR Next Gen: DETECTED — AB

## 2024-01-19 LAB — CBC WITH DIFFERENTIAL/PLATELET
Abs Immature Granulocytes: 0.1 10*3/uL — ABNORMAL HIGH (ref 0.00–0.07)
Basophils Absolute: 0 10*3/uL (ref 0.0–0.1)
Basophils Relative: 0 %
Eosinophils Absolute: 0 10*3/uL (ref 0.0–0.5)
Eosinophils Relative: 0 %
HCT: 28.4 % — ABNORMAL LOW (ref 39.0–52.0)
Hemoglobin: 8.9 g/dL — ABNORMAL LOW (ref 13.0–17.0)
Immature Granulocytes: 1 %
Lymphocytes Relative: 8 %
Lymphs Abs: 1.1 10*3/uL (ref 0.7–4.0)
MCH: 29.7 pg (ref 26.0–34.0)
MCHC: 31.3 g/dL (ref 30.0–36.0)
MCV: 94.7 fL (ref 80.0–100.0)
Monocytes Absolute: 1 10*3/uL (ref 0.1–1.0)
Monocytes Relative: 8 %
Neutro Abs: 10.9 10*3/uL — ABNORMAL HIGH (ref 1.7–7.7)
Neutrophils Relative %: 83 %
Platelets: 594 10*3/uL — ABNORMAL HIGH (ref 150–400)
RBC: 3 MIL/uL — ABNORMAL LOW (ref 4.22–5.81)
RDW: 15.5 % (ref 11.5–15.5)
WBC: 13.1 10*3/uL — ABNORMAL HIGH (ref 4.0–10.5)
nRBC: 0 % (ref 0.0–0.2)

## 2024-01-19 LAB — COMPREHENSIVE METABOLIC PANEL WITH GFR
ALT: 38 U/L (ref 0–44)
AST: 34 U/L (ref 15–41)
Albumin: 1.6 g/dL — ABNORMAL LOW (ref 3.5–5.0)
Alkaline Phosphatase: 203 U/L — ABNORMAL HIGH (ref 38–126)
Anion gap: 8 (ref 5–15)
BUN: <5 mg/dL — ABNORMAL LOW (ref 6–20)
CO2: 26 mmol/L (ref 22–32)
Calcium: 8.5 mg/dL — ABNORMAL LOW (ref 8.9–10.3)
Chloride: 96 mmol/L — ABNORMAL LOW (ref 98–111)
Creatinine, Ser: 0.41 mg/dL — ABNORMAL LOW (ref 0.61–1.24)
GFR, Estimated: >60 mL/min (ref >60)
Glucose, Bld: 131 mg/dL — ABNORMAL HIGH (ref 70–99)
Potassium: 4 mmol/L (ref 3.5–5.1)
Sodium: 130 mmol/L — ABNORMAL LOW (ref 135–145)
Total Bilirubin: 0.6 mg/dL (ref 0.0–1.2)
Total Protein: 6.2 g/dL — ABNORMAL LOW (ref 6.5–8.1)

## 2024-01-19 LAB — PHOSPHORUS: Phosphorus: 2.7 mg/dL (ref 2.5–4.6)

## 2024-01-19 LAB — MAGNESIUM: Magnesium: 1.4 mg/dL — ABNORMAL LOW (ref 1.7–2.4)

## 2024-01-19 MED ORDER — MAGNESIUM SULFATE 4 GM/100ML IV SOLN
4.0000 g | Freq: Once | INTRAVENOUS | Status: AC
  Administered 2024-01-19: 4 g via INTRAVENOUS
  Filled 2024-01-19: qty 100

## 2024-01-19 MED ORDER — SODIUM CHLORIDE 0.9 % IV BOLUS
250.0000 mL | Freq: Once | INTRAVENOUS | Status: AC
  Administered 2024-01-19: 250 mL via INTRAVENOUS

## 2024-01-19 MED ORDER — MAGNESIUM SULFATE 2 GM/50ML IV SOLN
2.0000 g | Freq: Once | INTRAVENOUS | Status: AC
Start: 2024-01-19 — End: 2024-01-19
  Administered 2024-01-19: 2 g via INTRAVENOUS
  Filled 2024-01-19: qty 50

## 2024-01-19 MED ORDER — MIDODRINE HCL 5 MG PO TABS
10.0000 mg | ORAL_TABLET | Freq: Three times a day (TID) | ORAL | Status: DC
  Administered 2024-01-19 – 2024-01-20 (×5): 10 mg via ORAL
  Filled 2024-01-19 (×5): qty 2

## 2024-01-19 MED ORDER — ACETAMINOPHEN 325 MG PO TABS
650.0000 mg | ORAL_TABLET | ORAL | Status: AC
  Administered 2024-01-19: 650 mg via ORAL
  Filled 2024-01-19: qty 2

## 2024-01-19 MED ORDER — OXYCODONE HCL 5 MG PO TABS
5.0000 mg | ORAL_TABLET | Freq: Four times a day (QID) | ORAL | Status: DC | PRN
  Administered 2024-01-19 – 2024-01-21 (×4): 5 mg via ORAL
  Filled 2024-01-19 (×4): qty 1

## 2024-01-19 MED ORDER — MAGNESIUM SULFATE IN D5W 1-5 GM/100ML-% IV SOLN: 1.0000 g | Freq: Once | INTRAVENOUS | Status: DC

## 2024-01-19 MED ORDER — MUPIROCIN 2 % EX OINT
1.0000 | TOPICAL_OINTMENT | Freq: Two times a day (BID) | CUTANEOUS | Status: DC
  Administered 2024-01-20 – 2024-01-21 (×3): 1 via NASAL
  Filled 2024-01-19: qty 22

## 2024-01-19 MED ORDER — LACTATED RINGERS IV BOLUS
1000.0000 mL | Freq: Once | INTRAVENOUS | Status: AC
  Administered 2024-01-19: 1000 mL via INTRAVENOUS

## 2024-01-19 MED ORDER — SODIUM CHLORIDE 0.9 % IV SOLN: INTRAVENOUS | Status: AC | PRN

## 2024-01-19 NOTE — TOC Progression Note (Signed)
 Transition of Care [facility]) - Progression Note    Patient Details  Name: Brandon [name] MRN: [id number] Date of Birth: 1966-04-02  Transition [facility]) CM/SW Contact  Jannice Mends, LCSW Phone Number: 01/19/2024, 12:22 PM  Clinical Narrative:    CSW provided update to [facility].   Expected Discharge Plan: Skilled Nursing Facility Barriers to Discharge: Continued Medical Work up  Expected Discharge Plan and Services In-house Referral: Clinical Social Work   Post Acute Care Choice: Skilled Nursing Facility Living arrangements for the past 2 months: Skilled Nursing Facility                                       Social Determinants of Health (SDOH) Interventions SDOH Screenings   Food Insecurity: Patient Unable To Answer (01/08/2024)  Housing: Patient Declined (01/08/2024)  Transportation Needs: Patient Declined (01/08/2024)  Utilities: Patient Declined (01/08/2024)  Physical Activity: Inactive (11/15/2021)  Stress: No Stress Concern Present (10/31/2021)  Tobacco Use: High Risk (01/06/2024)    Readmission Risk Interventions    01/08/2024   11:12 AM  Readmission Risk Prevention Plan  Transportation Screening Complete  Medication Review (RN Care Manager) Complete  PCP or Specialist appointment within 3-5 days of discharge Complete  HRI or Home Care Consult Complete  SW Recovery Care/Counseling Consult Complete  Palliative Care Screening Not Applicable  Skilled Nursing Facility Complete

## 2024-01-19 NOTE — Progress Notes (Signed)
 PROGRESS NOTE        PATIENT DETAILS Name: Brandon [name] Age: 58 y.o. Sex: male Date of Birth: [birth year] Admit Date: 01/06/2024 Admitting Physician Arnulfo Larch, MD RUE:[id number], [place] Kugel, FNP  Brief Summary: Patient is a 57 y.o.  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  Significant events: 4/15>> low back pain-emphysematous cystitis/gas in pelvis-admit to [facility]. 4/16>> MRI T-spine/L-spine with epidural abscess from T10-11 to L5-S1.  Significant studies: 4/15>> CT abdomen/pelvis: Extensive emphysematous cystitis-with mild extravesical extension of gas along both pelvic sidewalls, scattered abnormal gas in the spinal canal and from T12-L5 level. 4/16>> MRI C-spine: No evidence of discitis/abscess 4/16>> MRI thoracic/lumbar spine: Epidural abscess spanning T10-11 to L5-S1, disc signal at L4-5 and L5-S1.  Lower cord/cauda equina is diffusely compressed by collection.  4/18 >> TTE 1. Left ventricular ejection fraction, by estimation, is 65 to 70%. The left ventricle has normal function. There is mild left ventricular hypertrophy. Left ventricular diastolic parameters were normal.  2. Right ventricular systolic function is normal. The right ventricular size is normal.  3. The mitral valve is normal in structure. No evidence of mitral valve regurgitation.  4. AV is thickened, moderately calcified with no significant stenosis.. The aortic valve is tricuspid. Aortic valve regurgitation is not visualized. Aortic valve sclerosis/calcification is present, without any evidence of aortic  stenosis.  4/22.  PICC line.  4/22.  MRI T-spine repeat - 1. The examination was terminated prior to completion by the patient. No axial images were obtained. 2. Decreased size of dorsal collection of the lower thoracic spinal canal. Residual material measures approximately 3 mm thick, though assessment is limited in the absence of axial images and contrast agent. 3. Unchanged appearance at the T9-11 levels with ankylosis at the site of prior discitis-osteomyelitis. No evidence of active discitis-osteomyelitis.   Significant microbiology data: 4/15>> blood culture: Strep pneumo/ESBL Klebsiella pneumoniae 4/17>> blood culture: No growth.  Procedures:  Consults: ID Neurosurgery Cardiology. Urology  Subjective: Patient in bed, appears comfortable, denies any headache, no fever, no chest pain or pressure, no shortness of breath , no abdominal pain. No new focal weakness.  Objective: Vitals: Blood pressure 101/77, pulse (!) 116, temperature 97.7 F (36.5 C), temperature source Oral, resp. rate 14, weight 64.4 kg, SpO2 99%.   Exam:  Frail and cachectic, middle-aged white male, awake Alert, No new F.N deficits, chronic left-sided hemiparesis from previous stroke, condom catheter in place, right arm PICC line, [place].AT,PERRAL Supple Neck, No JVD,   Symmetrical Chest wall movement, Good air movement bilaterally, CTAB RRR,No Gallops, Rubs or new Murmurs,  +ve B.Sounds, Abd Soft, No tenderness,   No Cyanosis, Clubbing or edema    Assessment/Plan:  Sepsis secondary to polymicrobial bacteremia with streptococci/Klebsiella pneumonia-along with emphysematous cystitis with T10-11 to L5-S1 epidural abscess with resultant cord compression Sepsis physiology gradually improving IV meropenem  Uncooperative-but appears to have significant pain/weakness in bilateral lower extremities compared to upper  Has been seen by both IR and neurosurgeon Dr. Ellery Guthrie, not a candidate for any procedure per both IR  and neurosurgery.  Repeat MRI T-spine on 01/13/2024 although limited as testing was aborted by the patient midway appears stable. Evaluated by urology-apart from antibiotics - possible Foley catheter placement (patient refused) and outpatient cystoscopy-no further inpatient recommendations. Continue meropenem , Repeat blood cultures negative so far TTE done on 4/18-appears nonacute Case discussed with ID, he will get a PICC line with 8 weeks of IV Meropenam stop date 03/04/24 .  PICC line placed 01/13/2024.  Postdischarge follow-up with neurosurgery, ID and urology.  SVT with aberrancy [name] in sinus, TSH stable, echo stable, beta-blocker adjusted on 01/13/2024 as blood pressure is labile.  Acute metabolic encephalopathy Secondary to sepsis physiology Improved-but easily agitated this morning.  Answer simple questions appropriately.  Hyponatremia.  SIADH, failed fluid restriction and Lasix , trial of Samsca  on 01/13/2024  Hypokalemia/hypomagnesemia  - Repleted  Transaminitis Suspect this is sepsis/shock liver Downtrending-continue to follow-if worsens then will initiate further workup.  History of CVA with significant left-sided hemiparesis Difficult exam but seems to have significant paraparesis/pain issues from cord compression due to epidural abscess Per patient he apparently was using a quad cane-however per neurosurgery-patient has been nonambulatory for several months.  CAD-s/p PCI in 2016 No anginal symptoms Since no plans by IR to aspirate epidural abscess-resume Plavix .  HTN - B. Blocker adjusted further on 01/13/24  History of seizure disorder  Keppra   History of chronic pancreatitis Presumably from prior history of EtOH use Continue Creon   BPH  Flomax   History of EtOH use Apparently not drinkiNG anymore-SNF resident Will watch for withdrawal symptoms  History of tobacco abuse - counseled to quit  DM-2 (A1c 9.4 on 4/16) with uncontrolled hyperglycemia Patient has very  erratic oral intake, blood sugars very labile and fluctuating, insulin  adjusted multiple times, discontinue long-acting insulin  on 01/18/2024 and monitor  CBG (last 3)  Recent Labs    01/18/24 1534 01/18/24 2016 01/19/24 0804  GLUCAP 220* 160* 119*    Lab Results  Component Value Date   HGBA1C 9.4 (H) 01/07/2024      Code status:   Code Status: Full Code   DVT Prophylaxis: SCDs Start: 01/06/24 2054   Family Communication: Sister-[name] [phone] left VM 4/17,4/18   Disposition Plan: Status is: Inpatient Remains inpatient appropriate because: Severity of illness   Planned Discharge Destination:Skilled nursing facility   Diet: Diet Order             Diet regular Room service appropriate? Yes; Fluid consistency: Thin  Diet effective now                    MEDICATIONS: Scheduled Meds:  busPIRone   10 mg Oral BID   Chlorhexidine  Gluconate Cloth  6 each Topical Daily   clopidogrel   75 mg Oral Daily   famotidine   20 mg Oral BID   insulin  aspart  0-9 Units Subcutaneous TID WC   levETIRAcetam   500 mg Oral BID   lipase/protease/amylase  36,000 Units Oral TID WC   midodrine   10 mg Oral TID WC   nicotine   14 mg Transdermal Daily   polyethylene glycol  17 g Oral Daily   rosuvastatin   5 mg Oral QHS   senna-docusate  2 tablet Oral QHS   sodium chloride  flush  3 mL Intravenous Q12H   tamsulosin   0.4 mg Oral QPC supper   traZODone   100 mg Oral QHS   Continuous Infusions:  lactated ringers      magnesium  sulfate bolus IVPB     magnesium  sulfate bolus IVPB  meropenem  (MERREM ) IV 1 g (01/19/24 0520)   PRN Meds:.acetaminophen , albuterol , dextrose , metoprolol  tartrate, oxyCODONE  **OR** [DISCONTINUED] oxyCODONE , sodium chloride  flush   I have personally reviewed following labs and imaging studies  LABORATORY DATA: Recent Labs  Lab 01/13/24 0356 01/14/24 0247 01/15/24 0438 01/19/24 0503  WBC 15.7* 12.9* 13.4* 13.1*  HGB 9.4* 9.6* 9.4* 8.9*  HCT  27.7* 29.7* 29.6* 28.4*  PLT 578* 613* 638* 594*  MCV 89.9 94.0 94.6 94.7  MCH 30.5 30.4 30.0 29.7  MCHC 33.9 32.3 31.8 31.3  RDW 15.2 15.5 15.6* 15.5  LYMPHSABS 1.1 1.2 1.3 1.1  MONOABS 0.8 0.8 0.8 1.0  EOSABS 0.0 0.0 0.0 0.0  BASOSABS 0.0 0.0 0.0 0.0    Recent Labs  Lab 01/13/24 0356 01/14/24 0247 01/15/24 0438 01/19/24 0503  NA 127* 138 132* 130*  K 4.3 3.8 3.8 4.0  CL 94* 102 97* 96*  CO2 25 27 25 26   ANIONGAP 8 9 10 8   GLUCOSE 91 122* 93 131*  BUN 9 7 7  <5*  CREATININE 0.56* 0.44* 0.40* 0.41*  AST 112* 103* 61* 34  ALT 78* 82* 61* 38  ALKPHOS 242* 262* 230* 203*  BILITOT 0.7 0.6 0.6 0.6  ALBUMIN <1.5* <1.5* <1.5* 1.6*  CRP 12.9* 13.8*  --   --   PROCALCITON 0.10 <0.10 <0.10  --   MG 1.8 1.6* 1.6* 1.4*  PHOS  --   --   --  2.7  CALCIUM  7.9* 8.6* 8.6* 8.5*     Recent Labs  Lab 01/13/24 0356 01/14/24 0247 01/15/24 0438 01/19/24 0503  CRP 12.9* 13.8*  --   --   PROCALCITON 0.10 <0.10 <0.10  --   MG 1.8 1.6* 1.6* 1.4*  CALCIUM  7.9* 8.6* 8.6* 8.5*  ------------------------------------------------------------------------------------------------- Lab Results  Component Value Date   CHOL 74 03/06/2016   HDL 27 (L) 03/06/2016   LDLCALC 37 03/06/2016   TRIG 51 03/06/2016   CHOLHDL 2.7 03/06/2016    Lab Results  Component Value Date   HGBA1C 9.4 (H) 01/07/2024   No results for input(s): "TSH", "T4TOTAL", "FREET4", "T3FREE", "THYROIDAB" in the last 72 hours.  Radiology Reports  No results found.    RADIOLOGY STUDIES/RESULTS: No results found.    LOS: 13 days   Signature  -    Lynnwood Sauer M.D on 01/19/2024 at 8:54 AM   -  To page go to [email or web address]

## 2024-01-19 NOTE — Plan of Care (Signed)
  Problem: Education: Goal: Ability to describe self-care measures that may prevent or decrease complications (Diabetes Survival Skills Education) will improve Outcome: Progressing   Problem: Coping: Goal: Ability to adjust to condition or change in health will improve Outcome: Progressing   Problem: Health Behavior/Discharge Planning: Goal: Ability to identify and utilize available resources and services will improve Outcome: Progressing   Problem: Metabolic: Goal: Ability to maintain appropriate glucose levels will improve Outcome: Progressing   Problem: Nutritional: Goal: Maintenance of adequate nutrition will improve Outcome: Progressing   Problem: Skin Integrity: Goal: Risk for impaired skin integrity will decrease Outcome: Progressing   Problem: Education: Goal: Ability to describe self-care measures that may prevent or decrease complications (Diabetes Survival Skills Education) will improve Outcome: Progressing   Problem: Cardiac: Goal: Ability to maintain an adequate cardiac output will improve Outcome: Progressing

## 2024-01-19 NOTE — Plan of Care (Signed)
  Problem: Nutritional: Goal: Maintenance of adequate nutrition will improve Outcome: Progressing   Problem: Skin Integrity: Goal: Risk for impaired skin integrity will decrease Outcome: Progressing   Problem: Respiratory: Goal: Will regain and/or maintain adequate ventilation Outcome: Progressing   Problem: Urinary Elimination: Goal: Ability to achieve and maintain adequate renal perfusion and functioning will improve Outcome: Progressing   Problem: Clinical Measurements: Goal: Diagnostic test results will improve Outcome: Progressing Goal: Respiratory complications will improve Outcome: Progressing

## 2024-01-20 DIAGNOSIS — G062 Extradural and subdural abscess, unspecified: Secondary | ICD-10-CM | POA: Diagnosis not present

## 2024-01-20 LAB — GLUCOSE, CAPILLARY
Glucose-Capillary: 161 mg/dL — ABNORMAL HIGH (ref 70–99)
Glucose-Capillary: 314 mg/dL — ABNORMAL HIGH (ref 70–99)
Glucose-Capillary: 247 mg/dL — ABNORMAL HIGH (ref 70–99)
Glucose-Capillary: 167 mg/dL — ABNORMAL HIGH (ref 70–99)

## 2024-01-20 LAB — TSH: TSH: 1.511 u[IU]/mL (ref 0.350–4.500)

## 2024-01-20 LAB — MAGNESIUM: Magnesium: 2 mg/dL (ref 1.7–2.4)

## 2024-01-20 LAB — BASIC METABOLIC PANEL WITH GFR
Anion gap: 7 (ref 5–15)
BUN: 5 mg/dL — ABNORMAL LOW (ref 6–20)
CO2: 27 mmol/L (ref 22–32)
Calcium: 8 mg/dL — ABNORMAL LOW (ref 8.9–10.3)
Chloride: 98 mmol/L (ref 98–111)
Creatinine, Ser: 0.46 mg/dL — ABNORMAL LOW (ref 0.61–1.24)
GFR, Estimated: >60 mL/min (ref >60)
Glucose, Bld: 184 mg/dL — ABNORMAL HIGH (ref 70–99)
Potassium: 3.7 mmol/L (ref 3.5–5.1)
Sodium: 132 mmol/L — ABNORMAL LOW (ref 135–145)

## 2024-01-20 LAB — CORTISOL: Cortisol, Plasma: 9.6 ug/dL

## 2024-01-20 MED ORDER — ORAL CARE MOUTH RINSE: 15.0000 mL | OROMUCOSAL | Status: DC | PRN

## 2024-01-20 MED ORDER — ALBUMIN HUMAN 25 % IV SOLN
50.0000 g | Freq: Once | INTRAVENOUS | Status: AC
  Administered 2024-01-20: 50 g via INTRAVENOUS
  Filled 2024-01-20: qty 200

## 2024-01-20 MED ORDER — PNEUMOCOCCAL 20-VAL CONJ VACC 0.5 ML IM SUSY: 0.5000 mL | PREFILLED_SYRINGE | INTRAMUSCULAR | Status: DC

## 2024-01-20 MED ORDER — LACTATED RINGERS IV BOLUS
1000.0000 mL | Freq: Once | INTRAVENOUS | Status: AC
  Administered 2024-01-20: 1000 mL via INTRAVENOUS

## 2024-01-20 MED ORDER — OXYCODONE HCL 5 MG PO TABS: 5.0000 mg | ORAL_TABLET | Freq: Four times a day (QID) | ORAL | 0 refills | Status: DC | PRN

## 2024-01-20 MED ORDER — INFLUENZA VIRUS VACC SPLIT PF (FLUZONE) 0.5 ML IM SUSY: 0.5000 mL | PREFILLED_SYRINGE | INTRAMUSCULAR | Status: DC

## 2024-01-20 MED ORDER — MEROPENEM IV (FOR PTA / DISCHARGE USE ONLY): 1.0000 g | Freq: Three times a day (TID) | INTRAVENOUS | 0 refills | Status: AC

## 2024-01-20 MED ORDER — MIDODRINE HCL 5 MG PO TABS
15.0000 mg | ORAL_TABLET | Freq: Three times a day (TID) | ORAL | Status: DC
  Administered 2024-01-20: 15 mg via ORAL
  Filled 2024-01-20: qty 3

## 2024-01-20 MED ORDER — TRAZODONE HCL 50 MG PO TABS: 50.0000 mg | ORAL_TABLET | Freq: Every day | ORAL | 0 refills | Status: DC

## 2024-01-20 MED ORDER — ALBUMIN HUMAN 25 % IV SOLN
50.0000 g | Freq: Once | INTRAVENOUS | Status: AC
  Administered 2024-01-21: 50 g via INTRAVENOUS
  Filled 2024-01-20: qty 200

## 2024-01-20 MED ORDER — PROSOURCE PLUS PO LIQD
30.0000 mL | Freq: Two times a day (BID) | ORAL | Status: DC
  Administered 2024-01-20 – 2024-01-21 (×3): 30 mL via ORAL
  Filled 2024-01-20 (×3): qty 30

## 2024-01-20 MED ORDER — ORAL CARE MOUTH RINSE
15.0000 mL | OROMUCOSAL | Status: DC
  Administered 2024-01-20 – 2024-01-21 (×4): 15 mL via OROMUCOSAL

## 2024-01-20 MED ORDER — MIDODRINE HCL 10 MG PO TABS: 10.0000 mg | ORAL_TABLET | Freq: Three times a day (TID) | ORAL | Status: DC

## 2024-01-20 MED ORDER — MIDODRINE HCL 5 MG PO TABS
10.0000 mg | ORAL_TABLET | Freq: Three times a day (TID) | ORAL | Status: DC
  Administered 2024-01-21 (×3): 10 mg via ORAL
  Filled 2024-01-20 (×3): qty 2

## 2024-01-20 MED ORDER — SODIUM CHLORIDE 1 G PO TABS
2.0000 g | ORAL_TABLET | Freq: Two times a day (BID) | ORAL | Status: DC
  Administered 2024-01-20 – 2024-01-21 (×3): 2 g via ORAL
  Filled 2024-01-20 (×3): qty 2

## 2024-01-20 MED ORDER — POLYETHYLENE GLYCOL 3350 17 G PO PACK: 17.0000 g | PACK | Freq: Every day | ORAL | Status: DC | PRN

## 2024-01-20 MED ORDER — HEPARIN SOD (PORK) LOCK FLUSH 100 UNIT/ML IV SOLN
250.0000 [IU] | INTRAVENOUS | Status: AC | PRN
  Administered 2024-01-20: 250 [IU]

## 2024-01-20 NOTE — Progress Notes (Signed)
 OT Cancellation Note  Patient Details Name: ROHITH PICTON MRN: [id number] DOB: 1966/01/25   Cancelled Treatment:    Reason Eval/Treat Not Completed: (P) Patient declined, tired, finally got comfortable in bed, did not want to be bothered. Will try again tomorrow.  Scherry Curtis 01/20/2024, 4:05 PM

## 2024-01-20 NOTE — Plan of Care (Addendum)
 Post DC, BP dropping again, PO intake remains poor, IVF, IV Albumin, PO Salt tablet, Midodrene and Prosourse - check CBC, CMP, cortisol and TSH in am. Hold DC

## 2024-01-20 NOTE — Discharge Summary (Signed)
 Brandon [name] UJW:[id number] DOB: March 07, 1966 DOA: 01/06/2024  PCP: Sharyne Degree, FNP  Admit date: 01/06/2024  Discharge date: 01/20/2024  Admitted From: Home   Disposition:  SNF   Recommendations for Outpatient Follow-up:   Follow up with PCP in 1-2 weeks  PCP Please obtain BMP/CBC, 2 view CXR in 1week,  (see Discharge instructions)   PCP Please follow up on the following pending results:    Home Health: None   Equipment/Devices: None  Consultations: ID, urology, cardiology, neurosurgery Discharge Condition: Guarded  CODE STATUS: Full  Diet Recommendation: Heart Healthy  Low Carb    No chief complaint on file.    Brief history of present illness from the day of admission and additional interim summary    58 y.o.  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   Significant events: 4/15>> low back pain-emphysematous cystitis/gas in pelvis-admit to [facility]. 4/16>> MRI T-spine/L-spine with epidural abscess from T10-11 to L5-S1.   Significant studies: 4/15>> CT abdomen/pelvis: Extensive emphysematous cystitis-with mild extravesical extension of gas along both pelvic sidewalls, scattered abnormal gas in the spinal canal and from T12-L5 level. 4/16>> MRI C-spine: No evidence of discitis/abscess 4/16>> MRI thoracic/lumbar spine: Epidural abscess spanning T10-11 to L5-S1, disc signal at L4-5 and L5-S1.  Lower cord/cauda equina is  diffusely compressed by collection.   4/18 >> TTE 1. Left ventricular ejection fraction, by estimation, is 65 to 70%. The left ventricle has normal function. There is mild left ventricular hypertrophy. Left ventricular diastolic parameters were normal.  2. Right ventricular systolic function is normal. The right ventricular size is normal.  3. The mitral valve is normal in structure. No evidence of mitral valve regurgitation.  4. AV is thickened, moderately calcified with no significant stenosis.. The aortic valve is tricuspid. Aortic valve regurgitation is not visualized. Aortic valve sclerosis/calcification is present, without any evidence of aortic stenosis.   4/22.  PICC line.   4/22.  MRI T-spine repeat - 1. The examination was terminated prior to completion by the patient. No axial images were obtained. 2. Decreased size of dorsal collection of the lower thoracic spinal canal. Residual material measures approximately 3 mm thick, though assessment is limited in the absence of axial images and contrast agent. 3. Unchanged appearance at the T9-11 levels with ankylosis at the site of prior discitis-osteomyelitis. No evidence of active discitis-osteomyelitis.     Significant microbiology data: 4/15>> blood culture: Strep pneumo/ESBL Klebsiella pneumoniae  Hospital Course   Sepsis secondary to polymicrobial bacteremia with streptococci/Klebsiella pneumonia-along with emphysematous cystitis with T10-11 to L5-S1 epidural abscess with resultant cord compression Sepsis physiology gradually improving IV meropenem  Uncooperative-but appears to have significant pain/weakness in bilateral lower extremities compared to upper  Has been seen by both IR and neurosurgeon Dr. Ellery Guthrie, not a candidate for any procedure per both IR and neurosurgery.  Repeat MRI T-spine on 01/13/2024 although limited as testing was aborted by the patient midway appears  stable. Evaluated by urology-apart from antibiotics - possible Foley catheter placement (patient refused) and outpatient cystoscopy-no further inpatient recommendations. Continue meropenem , Repeat blood cultures negative so far TTE done on 4/18-appears nonacute Case discussed with ID, he will be discharged with right arm PICC line along with 8 weeks of IV Meropenam stop date 03/04/24 .  PICC line placed 01/13/2024.  Postdischarge follow-up with neurosurgery, ID and urology.   SVT with aberrancy Now in sinus, TSH stable, echo stable, beta-blocker stopped as blood pressure remains too low.  Requiring midodrine .   Acute metabolic encephalopathy Secondary to sepsis physiology Improved-but easily agitated this morning.  Answer simple questions appropriately.   Hyponatremia.  SIADH, failed fluid restriction and Lasix , improved after trial of Samsca  on 01/13/2024   Hypokalemia/hypomagnesemia  - Repleted   Transaminitis Suspect this is sepsis/shock liver Downtrending-continue to follow-if worsens then will initiate further workup.   History of CVA with significant left-sided hemiparesis Difficult exam but seems to have significant paraparesis/pain issues from cord compression due to epidural abscess Per patient he apparently was using a quad cane-however per neurosurgery-patient has been nonambulatory for several months.   CAD-s/p PCI in 2016 No anginal symptoms Since no plans by IR to aspirate epidural abscess-resume Plavix .   HTN - B. Blocker adjusted further on 01/13/24   History of seizure disorder  Keppra    History of chronic pancreatitis Presumably from prior history of EtOH use Continue Creon    BPH  Flomax    History of EtOH use Apparently not drinking anymore-SNF resident Will watch for withdrawal symptoms   History of tobacco abuse - counseled to quit   DM-2 (A1c 9.4 on 4/16) with uncontrolled hyperglycemia Patient has very erratic oral intake, blood sugars very labile and  fluctuating, insulin  adjusted multiple times, discontinue long-acting insulin  on 01/18/2024, continue q. ACH S sliding scale and monitor CBGs.  Adjust as needed at SNF.      Discharge diagnosis     Principal Problem:   Epidural abscess Active Problems:   Uncontrolled type 2 diabetes mellitus with hyperglycemia, with long-term current use of insulin  (HCC)   Wide-complex tachycardia   Diskitis   Emphysematous cystitis   Abnormal findings on diagnostic imaging of spine   Acute pyelonephritis   Paraplegia (HCC)   Sepsis (HCC)   Spinal abscess (HCC)   SVT (supraventricular tachycardia) (HCC)   Infection due to ESBL-producing Klebsiella pneumoniae   Gram-negative bacteremia   Hyperglycemia   Flank pain   Pneumococcal bacteremia   Pneumaturia    Discharge instructions    Discharge Instructions     Discharge instructions   Complete by: As directed    Follow with Primary MD Sharyne Degree, FNP in 7 days   Get CBC, CMP, 2 view Chest X ray -  checked next visit with your SNF MD   Activity: As tolerated with Full fall precautions use walker/cane & assistance as needed  Disposition SNF  Diet: Heart Healthy  Low Carb, check CBGs QAC-HS  Special Instructions: If you have  smoked or chewed Tobacco  in the last 2 yrs please stop smoking, stop any regular Alcohol   and or any Recreational drug use.  On your next visit with your primary care physician please Get Medicines reviewed and adjusted.  Please request your Prim.MD to go over all Hospital Tests and Procedure/Radiological results at the follow up, please get all Hospital records sent to your Prim MD by signing hospital release before you go home.  If you experience worsening of your admission symptoms, develop shortness of breath, life threatening emergency, suicidal or homicidal thoughts you must seek medical attention immediately by calling 911 or calling your MD immediately  if symptoms less severe.  You Must read complete  instructions/literature along with all the possible adverse reactions/side effects for all the Medicines you take and that have been prescribed to you. Take any new Medicines after you have completely understood and accpet all the possible adverse reactions/side effects.   Do not drive when taking Pain medications.  Do not take more than prescribed Pain, Sleep and Anxiety Medications  Wear Seat belts while driving.   Home infusion instructions   Complete by: As directed    To be administered at SNF   Instructions: Flushing of vascular access device: 0.9% NaCl pre/post medication administration and prn patency; Heparin  100 u/ml, 5ml for implanted ports and Heparin  10u/ml, 5ml for all other central venous catheters.   Increase activity slowly   Complete by: As directed    No wound care   Complete by: As directed        Discharge Medications   Allergies as of 01/20/2024   No Known Allergies      Medication List     STOP taking these medications    amoxicillin-clavulanate 875-125 MG tablet Commonly known as: AUGMENTIN   aspirin  EC 81 MG tablet   cefdinir 300 MG capsule Commonly known as: OMNICEF   insulin  glargine 100 UNIT/ML Solostar Pen Commonly known as: LANTUS    lidocaine  5 % Commonly known as: LIDODERM    metFORMIN  1000 MG tablet Commonly known as: GLUCOPHAGE    metoprolol  tartrate 100 MG tablet Commonly known as: LOPRESSOR    Trulicity 1.5 MG/0.5ML Soaj Generic drug: Dulaglutide       TAKE these medications    acetaminophen  325 MG tablet Commonly known as: TYLENOL  Take 650 mg by mouth every 6 (six) hours as needed for mild pain (pain score 1-3) (cannot exceed a total of 3,000 mg/24 hours).   ascorbic acid 500 MG tablet Commonly known as: VITAMIN C Take 500 mg by mouth daily.   busPIRone  10 MG tablet Commonly known as: BUSPAR  Take 1 tablet (10 mg total) by mouth 2 (two) times daily.   clopidogrel  75 MG tablet Commonly known as: PLAVIX  Take 1 tablet  (75 mg total) by mouth daily.   famotidine  20 MG tablet Commonly known as: PEPCID  Take 1 tablet (20 mg total) by mouth 2 (two) times daily.   feeding supplement (GLUCERNA SHAKE) Liqd Take 1 Container by mouth in the morning, at noon, and at bedtime.   FeroSul 325 (65 Fe) MG tablet Generic drug: ferrous sulfate  Take 1 tablet (325 mg total) by mouth daily.   HumaLOG KwikPen 100 UNIT/ML KwikPen Generic drug: insulin  lispro Inject 0-10 Units into the skin in the morning, at noon, and at bedtime. BS 70-130 inject 0 units  BS 131-180 inject 2 units  BS 181-240 inject 4 units  BS 241-300 inject 6 units  BS 301-350 inject 8 units  BS  351-400 inject 10 units   levETIRAcetam  500 MG tablet Commonly known as: KEPPRA  Take 1 tablet (500 mg total) by mouth 2 (two) times daily for 2 days.   lipase/protease/amylase 16109 UNITS Cpep capsule Commonly known as: CREON  Take 1 capsule (36,000 Units total) by mouth 3 (three) times daily with meals.   meropenem  IVPB Commonly known as: MERREM  Inject 1 g into the vein every 8 (eight) hours. Indication:  Discitis/epidural abscess First Dose: Yes Last Day of Therapy:  03/04/24 Labs - Once weekly:  CBC/D and BMP, ESR and CRP Method of administration: Mini-Bag Plus / Gravity Method of administration may be changed at the discretion of home infusion pharmacist based upon assessment of the patient and/or caregiver's ability to self-administer the medication ordered.   midodrine  10 MG tablet Commonly known as: PROAMATINE  Take 1 tablet (10 mg total) by mouth 3 (three) times daily with meals.   multivitamin with minerals Tabs tablet Take 1 tablet by mouth daily.   mupirocin  ointment 2 % Commonly known as: BACTROBAN  Apply topically 2 (two) times daily. What changed:  how much to take additional instructions   ondansetron  4 MG tablet Commonly known as: ZOFRAN  Take 4 mg by mouth every 6 (six) hours as needed for nausea or vomiting.   oxyCODONE  5 MG  immediate release tablet Commonly known as: Oxy IR/ROXICODONE  Take 1 tablet (5 mg total) by mouth every 6 (six) hours as needed for moderate pain (pain score 4-6) or severe pain (pain score 7-10).   polyethylene glycol 17 g packet Commonly known as: MIRALAX  / GLYCOLAX  Take 17 g by mouth daily as needed.   rosuvastatin  5 MG tablet Commonly known as: CRESTOR  Take 5 mg by mouth at bedtime.   senna 8.6 MG Tabs tablet Commonly known as: SENOKOT Take 8.6 mg by mouth in the morning and at bedtime.   tamsulosin  0.4 MG Caps capsule Commonly known as: FLOMAX  Take 1 capsule (0.4 mg total) by mouth daily after supper. What changed: when to take this   traZODone  50 MG tablet Commonly known as: DESYREL  Take 1 tablet (50 mg total) by mouth at bedtime. What changed:  medication strength how much to take               Home Infusion Instuctions  (From admission, onward)           Start     Ordered   01/20/24 0000  Home infusion instructions       Comments: To be administered at SNF  Question:  Instructions  Answer:  Flushing of vascular access device: 0.9% NaCl pre/post medication administration and prn patency; Heparin  100 u/ml, 5ml for implanted ports and Heparin  10u/ml, 5ml for all other central venous catheters.   01/20/24 0848             Follow-up Information     Sharyne Degree, FNP. Schedule an appointment as soon as possible for a visit in 1 week(s).   Specialty: Family Medicine Contact information: [address] [place] [address] [phone]         Elna Haggis, MD. Schedule an appointment as soon as possible for a visit in 4 week(s).   Specialty: Neurosurgery Contact information: [address] [place] [address] [phone]         Ernie Heal, Jerelyn Money, MD. Schedule an appointment as soon as possible for a visit in 3 week(s).   Specialty: Infectious Diseases Contact information: [address]  [place]  [place] [address] [phone]         Melody Spurling., MD. Schedule an appointment as soon as possible for a visit in 1 week(s).   Specialty: Urology Contact information: [address] [place] [address] [phone]                 Major procedures and Radiology Reports - PLEASE review detailed and final reports thoroughly  -       MR THORACIC SPINE WO CONTRAST Result Date: 01/13/2024 CLINICAL DATA:  Thoracic osteomyelitis EXAM: MRI THORACIC SPINE WITHOUT CONTRAST TECHNIQUE: Multiplanar, multisequence MR imaging of the thoracic spine was performed. No intravenous contrast was administered. COMPARISON:  01/07/2024 FINDINGS: The examination was terminated prior to completion by the patient. No axial images were obtained. Alignment: Physiologic. Vertebrae: Unchanged appearance at the T9-11 levels with ankylosis at the site of prior discitis-osteomyelitis. Unchanged chronic T12 compression deformity. No evidence of active discitis-osteomyelitis. Cord: Decreased size of dorsal collection of the lower thoracic spinal canal. Residual material measures approximately 3 mm thick, though assessment is limited in the absence of axial images and the absence of contrast. Paraspinal and other soft tissues: Negative Disc levels: No spinal canal stenosis. IMPRESSION: 1. The examination was terminated prior to completion by the patient. No axial images were obtained. 2. Decreased size of dorsal collection of the lower thoracic spinal canal. Residual material measures approximately 3 mm thick, though assessment is limited in the absence of axial images and contrast agent. 3. Unchanged appearance at the T9-11 levels with ankylosis at the site of prior discitis-osteomyelitis. No evidence of active discitis-osteomyelitis. Electronically Signed   By: Juanetta Nordmann M.D.   On: 01/13/2024 23:02   US  EKG SITE RITE Result Date: 01/12/2024 If Site Rite image not attached, placement could not be  confirmed due to current cardiac rhythm.  ECHOCARDIOGRAM COMPLETE Result Date: 01/09/2024    ECHOCARDIOGRAM REPORT   Patient Name:   ANATOLI NOLA Date of Exam: 01/09/2024 Medical Rec #:  [id number]        Height:       72.0 in Accession #:    [id number]       Weight:       142.0 lb Date of Birth:  [birth year]        BSA:          1.842 m Patient Age:    57 years         BP:           151/96 mmHg Patient Gender: M                HR:           92 bpm. Exam Location:  Inpatient Procedure: 2D Echo, Cardiac Doppler and Color Doppler (Both Spectral and Color            Flow Doppler were utilized during procedure). Indications:    Bacteremia  History:        Patient has prior history of Echocardiogram examinations, most                 recent 02/07/2020. CAD, Stroke; Risk Factors:Hypertension,                 Dyslipidemia and Diabetes. SVT.  Sonographer:    Juanita Shaw Referring Phys: [id number] Tylene Galla M GHIMIRE IMPRESSIONS  1. Left ventricular ejection fraction, by estimation, is 65 to 70%. The left ventricle has normal function. There is mild left ventricular hypertrophy.  Left ventricular diastolic parameters were normal.  2. Right ventricular systolic function is normal. The right ventricular size is normal.  3. The mitral valve is normal in structure. No evidence of mitral valve regurgitation.  4. AV is thickened, moderately calcified with no significant stenosis.. The aortic valve is tricuspid. Aortic valve regurgitation is not visualized. Aortic valve sclerosis/calcification is present, without any evidence of aortic stenosis. Comparison(s): The left ventricular function is unchanged. FINDINGS  Left Ventricle: Left ventricular ejection fraction, by estimation, is 65 to 70%. The left ventricle has normal function. The left ventricular internal cavity size was normal in size. There is mild left ventricular hypertrophy. Left ventricular diastolic  parameters were normal. Right Ventricle: The right ventricular size is  normal. Right vetricular wall thickness was not assessed. Right ventricular systolic function is normal. Left Atrium: Left atrial size was normal in size. Right Atrium: Right atrial size was normal in size. Pericardium: There is no evidence of pericardial effusion. Mitral Valve: The mitral valve is normal in structure. No evidence of mitral valve regurgitation. MV peak gradient, 10.0 mmHg. The mean mitral valve gradient is 3.0 mmHg. Aortic Valve: AV is thickened, moderately calcified with no significant stenosis. The aortic valve is tricuspid. Aortic valve regurgitation is not visualized. Aortic valve sclerosis/calcification is present, without any evidence of aortic stenosis. Aortic valve mean gradient measures 4.0 mmHg. Aortic valve peak gradient measures 8.5 mmHg. Aortic valve area, by VTI measures 1.70 cm. Pulmonic Valve: The pulmonic valve was not well visualized. Pulmonic valve regurgitation is not visualized. Aorta: The aortic root and ascending aorta are structurally normal, with no evidence of dilitation. IAS/Shunts: No atrial level shunt detected by color flow Doppler.  LEFT VENTRICLE PLAX 2D LVIDd:         3.60 cm      Diastology LVIDs:         2.30 cm      LV e' medial:    6.31 cm/s LV PW:         0.90 cm      LV E/e' medial:  9.3 LV IVS:        1.20 cm      LV e' lateral:   6.20 cm/s LVOT diam:     2.00 cm      LV E/e' lateral: 9.4 LV SV:         35 LV SV Index:   19 LVOT Area:     3.14 cm  LV Volumes (MOD) LV vol d, MOD A2C: 101.0 ml LV vol d, MOD A4C: 115.0 ml LV vol s, MOD A2C: 30.3 ml LV vol s, MOD A4C: 45.7 ml LV SV MOD A2C:     70.7 ml LV SV MOD A4C:     115.0 ml LV SV MOD BP:      74.2 ml RIGHT VENTRICLE RV Basal diam:  2.80 cm RV Mid diam:    1.60 cm RV S prime:     21.40 cm/s TAPSE (M-mode): 2.0 cm LEFT ATRIUM             Index        RIGHT ATRIUM          Index LA diam:        4.20 cm 2.28 cm/m   RA Area:     5.28 cm LA Vol (A2C):   22.6 ml 12.27 ml/m  RA Volume:   5.54 ml  3.01 ml/m LA  Vol (A4C):   23.0 ml 12.49 ml/m  LA Biplane Vol: 23.2 ml 12.60 ml/m  AORTIC VALVE                    PULMONIC VALVE AV Area (Vmax):    1.57 cm     PV Vmax:       0.87 m/s AV Area (Vmean):   1.62 cm     PV Peak grad:  3.0 mmHg AV Area (VTI):     1.70 cm AV Vmax:           146.00 cm/s AV Vmean:          83.500 cm/s AV VTI:            0.209 m AV Peak Grad:      8.5 mmHg AV Mean Grad:      4.0 mmHg LVOT Vmax:         72.90 cm/s LVOT Vmean:        43.000 cm/s LVOT VTI:          0.113 m LVOT/AV VTI ratio: 0.54  AORTA Ao Root diam: 3.30 cm Ao Asc diam:  3.00 cm MITRAL VALVE MV Area (PHT): 2.82 cm    SHUNTS MV Area VTI:   1.27 cm    Systemic VTI:  0.11 m MV Peak grad:  10.0 mmHg   Systemic Diam: 2.00 cm MV Mean grad:  3.0 mmHg MV Vmax:       1.58 m/s MV Vmean:      77.8 cm/s MV Decel Time: 269 msec MV E velocity: 58.40 cm/s MV A velocity: 80.20 cm/s MV E/A ratio:  0.73 Ola Berger MD Electronically signed by Ola Berger MD Signature Date/Time: 01/09/2024/12:08:21 PM    Final    MR THORACIC SPINE W WO CONTRAST Result Date: 01/07/2024 CLINICAL DATA:  Possible spinal abscess on CT EXAM: MRI CERVICAL, THORACIC AND LUMBAR SPINE WITHOUT AND WITH CONTRAST TECHNIQUE: Multiplanar and multiecho pulse sequences of the cervical spine, to include the craniocervical junction and cervicothoracic junction, and thoracic and lumbar spine, were obtained without and with intravenous contrast. CONTRAST:  7mL GADAVIST  GADOBUTROL  1 MMOL/ML IV SOLN COMPARISON:  Abdominal CT from yesterday.  Thoracic MRI 02/06/2020 FINDINGS: MRI CERVICAL SPINE FINDINGS Alignment: No acute malalignment Vertebrae: Endplate edema at E4-5 and C3-4 is likely degenerative. No evidence of endplate destruction or paravertebral edema. No acute fracture Cord: Not well assessed given the degree of motion artifact, canal exclude cord signal abnormality. Posterior Fossa, vertebral arteries, paraspinal tissues: Absent flow void with collapse in the right ICA. The distal  right vertebral flow void is also not seen, both findings known from 2023 brain MRI. Disc levels: C2-3: Facet spurring and disc space narrowing without impingement C3-4: Disc collapse with endplate and uncovertebral ridging. There is a broad central protrusion. Spinal stenosis with cord flattening, midline canal diameter of 5 mm. Bilateral foraminal narrowing is mild by postcontrast axial images. C4-5: Disc collapse and endplate degeneration with circumferential disc bulging and ridging. Spinal stenosis with cord flattening. Advanced right and moderate left foraminal narrowing by postcontrast axial images. C5-6: Disc narrowing and bulging with uncovertebral spurring. Degenerative facet spurring greater on the right. Mild spinal stenosis. Moderate bilateral foraminal narrowing by axial postcontrast images. C6-7: Disc space narrowing and mild spurring. No evidence of impingement C7-T1:Mild facet spurring and disc bulging. No evidence of impingement MRI THORACIC SPINE FINDINGS Alignment:  No acute malalignment.  Generalized thoracic kyphosis. Vertebrae: T9-10 and T10-11 intervertebral ankylosis, site of prior discitis. Remote compression fracture of T12.  No acute fracture or endplate destruction Cord: Normal cord signal. Posterior and right eccentric epidural collection with rim enhancement starting it T10-11 with epidural thickening starting at T7-8. The cord is displaced leftward and ventrally at T10 and below without detected cord edema. Paraspinal and other soft tissues: No paraspinal phlegmon or mass seen. Disc levels: Generalized disc desiccation and narrowing. Mild multilevel facet spurring. Small disc herniations at T4-5 and T8-9, insignificant. MRI LUMBAR SPINE FINDINGS Segmentation:  Standard. Alignment:  Mild L4-5 and L5-S1 retrolisthesis Vertebrae: T2 hyperintensity with gas phenomenon in the L4-5 and L5-S1 disc spaces, favor degenerative in the absence of significant endplate edema or endplate destruction.  No acute fracture. Remote L1 compression fracture. Mild degenerative marrow edema at the L4-5 facets. Conus medullaris and cauda equina: Posterior epidural collection beginning of thoracic spine becomes more ventral predominant at L3 and below, ventral collection measuring up to 7 mm in thickness on sagittal images with bubbles of gas. Although there is still continuing epidural thickening and hyperenhancement, no collection seen below L5-S1 Paraspinal and other soft tissues: No perispinal abscess detected. See prior abdominal CT Disc levels: Generalized degenerative disc narrowing and bulging with degenerative facet spurring at L2-3 and below. Degeneration contributes to bilateral foraminal stenosis at L3-4 and below spinal stenosis especially at L4-5. IMPRESSION: Cervical spine: No acute finding.  No evidence of discitis or abscess. Very motion degraded MRI still positive for moderate degenerative spinal stenosis at C3-4 and C4-5. Foraminal stenoses at C3-4 to C5-6. Absent right carotid and vertebral flow voids, chronic when compared to 2023. Thoracic and lumbar spine: Epidural abscess spanning T10-11 to L5-S1, primarily posterior canal in the upper extent and in the anterior canal at the lower extent. At the level of L3, ventral abscess measures up to 7 mm in thickness and contains gas. There is some disc edematous signal at L4-5 and L5-S1, but no clear discitis, suspect direct paravertebral seeding from UTI based on prior abdominal CT. The lower cord and cauda equina is diffusely compressed by the collection. Electronically Signed   By: Ronnette Coke M.D.   On: 01/07/2024 05:08   MR Lumbar Spine W Wo Contrast Result Date: 01/07/2024 CLINICAL DATA:  Possible spinal abscess on CT EXAM: MRI CERVICAL, THORACIC AND LUMBAR SPINE WITHOUT AND WITH CONTRAST TECHNIQUE: Multiplanar and multiecho pulse sequences of the cervical spine, to include the craniocervical junction and cervicothoracic junction, and thoracic and  lumbar spine, were obtained without and with intravenous contrast. CONTRAST:  7mL GADAVIST  GADOBUTROL  1 MMOL/ML IV SOLN COMPARISON:  Abdominal CT from yesterday.  Thoracic MRI 02/06/2020 FINDINGS: MRI CERVICAL SPINE FINDINGS Alignment: No acute malalignment Vertebrae: Endplate edema at W0-9 and C3-4 is likely degenerative. No evidence of endplate destruction or paravertebral edema. No acute fracture Cord: Not well assessed given the degree of motion artifact, canal exclude cord signal abnormality. Posterior Fossa, vertebral arteries, paraspinal tissues: Absent flow void with collapse in the right ICA. The distal right vertebral flow void is also not seen, both findings known from 2023 brain MRI. Disc levels: C2-3: Facet spurring and disc space narrowing without impingement C3-4: Disc collapse with endplate and uncovertebral ridging. There is a broad central protrusion. Spinal stenosis with cord flattening, midline canal diameter of 5 mm. Bilateral foraminal narrowing is mild by postcontrast axial images. C4-5: Disc collapse and endplate degeneration with circumferential disc bulging and ridging. Spinal stenosis with cord flattening. Advanced right and moderate left foraminal narrowing by postcontrast axial images. C5-6: Disc narrowing and bulging with uncovertebral spurring.  Degenerative facet spurring greater on the right. Mild spinal stenosis. Moderate bilateral foraminal narrowing by axial postcontrast images. C6-7: Disc space narrowing and mild spurring. No evidence of impingement C7-T1:Mild facet spurring and disc bulging. No evidence of impingement MRI THORACIC SPINE FINDINGS Alignment:  No acute malalignment.  Generalized thoracic kyphosis. Vertebrae: T9-10 and T10-11 intervertebral ankylosis, site of prior discitis. Remote compression fracture of T12. No acute fracture or endplate destruction Cord: Normal cord signal. Posterior and right eccentric epidural collection with rim enhancement starting it T10-11  with epidural thickening starting at T7-8. The cord is displaced leftward and ventrally at T10 and below without detected cord edema. Paraspinal and other soft tissues: No paraspinal phlegmon or mass seen. Disc levels: Generalized disc desiccation and narrowing. Mild multilevel facet spurring. Small disc herniations at T4-5 and T8-9, insignificant. MRI LUMBAR SPINE FINDINGS Segmentation:  Standard. Alignment:  Mild L4-5 and L5-S1 retrolisthesis Vertebrae: T2 hyperintensity with gas phenomenon in the L4-5 and L5-S1 disc spaces, favor degenerative in the absence of significant endplate edema or endplate destruction. No acute fracture. Remote L1 compression fracture. Mild degenerative marrow edema at the L4-5 facets. Conus medullaris and cauda equina: Posterior epidural collection beginning of thoracic spine becomes more ventral predominant at L3 and below, ventral collection measuring up to 7 mm in thickness on sagittal images with bubbles of gas. Although there is still continuing epidural thickening and hyperenhancement, no collection seen below L5-S1 Paraspinal and other soft tissues: No perispinal abscess detected. See prior abdominal CT Disc levels: Generalized degenerative disc narrowing and bulging with degenerative facet spurring at L2-3 and below. Degeneration contributes to bilateral foraminal stenosis at L3-4 and below spinal stenosis especially at L4-5. IMPRESSION: Cervical spine: No acute finding.  No evidence of discitis or abscess. Very motion degraded MRI still positive for moderate degenerative spinal stenosis at C3-4 and C4-5. Foraminal stenoses at C3-4 to C5-6. Absent right carotid and vertebral flow voids, chronic when compared to 2023. Thoracic and lumbar spine: Epidural abscess spanning T10-11 to L5-S1, primarily posterior canal in the upper extent and in the anterior canal at the lower extent. At the level of L3, ventral abscess measures up to 7 mm in thickness and contains gas. There is some  disc edematous signal at L4-5 and L5-S1, but no clear discitis, suspect direct paravertebral seeding from UTI based on prior abdominal CT. The lower cord and cauda equina is diffusely compressed by the collection. Electronically Signed   By: Ronnette Coke M.D.   On: 01/07/2024 05:08   MR CERVICAL SPINE W WO CONTRAST Result Date: 01/07/2024 CLINICAL DATA:  Possible spinal abscess on CT EXAM: MRI CERVICAL, THORACIC AND LUMBAR SPINE WITHOUT AND WITH CONTRAST TECHNIQUE: Multiplanar and multiecho pulse sequences of the cervical spine, to include the craniocervical junction and cervicothoracic junction, and thoracic and lumbar spine, were obtained without and with intravenous contrast. CONTRAST:  7mL GADAVIST  GADOBUTROL  1 MMOL/ML IV SOLN COMPARISON:  Abdominal CT from yesterday.  Thoracic MRI 02/06/2020 FINDINGS: MRI CERVICAL SPINE FINDINGS Alignment: No acute malalignment Vertebrae: Endplate edema at Z6-1 and C3-4 is likely degenerative. No evidence of endplate destruction or paravertebral edema. No acute fracture Cord: Not well assessed given the degree of motion artifact, canal exclude cord signal abnormality. Posterior Fossa, vertebral arteries, paraspinal tissues: Absent flow void with collapse in the right ICA. The distal right vertebral flow void is also not seen, both findings known from 2023 brain MRI. Disc levels: C2-3: Facet spurring and disc space narrowing without impingement C3-4: Disc collapse  with endplate and uncovertebral ridging. There is a broad central protrusion. Spinal stenosis with cord flattening, midline canal diameter of 5 mm. Bilateral foraminal narrowing is mild by postcontrast axial images. C4-5: Disc collapse and endplate degeneration with circumferential disc bulging and ridging. Spinal stenosis with cord flattening. Advanced right and moderate left foraminal narrowing by postcontrast axial images. C5-6: Disc narrowing and bulging with uncovertebral spurring. Degenerative facet  spurring greater on the right. Mild spinal stenosis. Moderate bilateral foraminal narrowing by axial postcontrast images. C6-7: Disc space narrowing and mild spurring. No evidence of impingement C7-T1:Mild facet spurring and disc bulging. No evidence of impingement MRI THORACIC SPINE FINDINGS Alignment:  No acute malalignment.  Generalized thoracic kyphosis. Vertebrae: T9-10 and T10-11 intervertebral ankylosis, site of prior discitis. Remote compression fracture of T12. No acute fracture or endplate destruction Cord: Normal cord signal. Posterior and right eccentric epidural collection with rim enhancement starting it T10-11 with epidural thickening starting at T7-8. The cord is displaced leftward and ventrally at T10 and below without detected cord edema. Paraspinal and other soft tissues: No paraspinal phlegmon or mass seen. Disc levels: Generalized disc desiccation and narrowing. Mild multilevel facet spurring. Small disc herniations at T4-5 and T8-9, insignificant. MRI LUMBAR SPINE FINDINGS Segmentation:  Standard. Alignment:  Mild L4-5 and L5-S1 retrolisthesis Vertebrae: T2 hyperintensity with gas phenomenon in the L4-5 and L5-S1 disc spaces, favor degenerative in the absence of significant endplate edema or endplate destruction. No acute fracture. Remote L1 compression fracture. Mild degenerative marrow edema at the L4-5 facets. Conus medullaris and cauda equina: Posterior epidural collection beginning of thoracic spine becomes more ventral predominant at L3 and below, ventral collection measuring up to 7 mm in thickness on sagittal images with bubbles of gas. Although there is still continuing epidural thickening and hyperenhancement, no collection seen below L5-S1 Paraspinal and other soft tissues: No perispinal abscess detected. See prior abdominal CT Disc levels: Generalized degenerative disc narrowing and bulging with degenerative facet spurring at L2-3 and below. Degeneration contributes to bilateral  foraminal stenosis at L3-4 and below spinal stenosis especially at L4-5. IMPRESSION: Cervical spine: No acute finding.  No evidence of discitis or abscess. Very motion degraded MRI still positive for moderate degenerative spinal stenosis at C3-4 and C4-5. Foraminal stenoses at C3-4 to C5-6. Absent right carotid and vertebral flow voids, chronic when compared to 2023. Thoracic and lumbar spine: Epidural abscess spanning T10-11 to L5-S1, primarily posterior canal in the upper extent and in the anterior canal at the lower extent. At the level of L3, ventral abscess measures up to 7 mm in thickness and contains gas. There is some disc edematous signal at L4-5 and L5-S1, but no clear discitis, suspect direct paravertebral seeding from UTI based on prior abdominal CT. The lower cord and cauda equina is diffusely compressed by the collection. Electronically Signed   By: Ronnette Coke M.D.   On: 01/07/2024 05:08   CT ABDOMEN PELVIS W CONTRAST Result Date: 01/06/2024 CLINICAL DATA:  Urinary tract infection, recent change in antibiotics. Back pain/flank pain. EXAM: CT ABDOMEN AND PELVIS WITH CONTRAST TECHNIQUE: Multidetector CT imaging of the abdomen and pelvis was performed using the standard protocol following bolus administration of intravenous contrast. RADIATION DOSE REDUCTION: This exam was performed according to the departmental dose-optimization program which includes automated exposure control, adjustment of the mA and/or kV according to patient size and/or use of iterative reconstruction technique. CONTRAST:  OMNIPAQUE  IOHEXOL  300 MG/ML  SOLN COMPARISON:  Lumbar radiographs 12/31/2023 and CT abdomen 02/06/2020  FINDINGS: The patient is contracted and rotated on today's exam with some resulting reduced diagnostic accuracy. Lower chest: Coronary and descending thoracic aortic atherosclerosis. Distal esophageal wall thickening is nonspecific although esophagitis would be a common cause. Airway plugging in the  right lower lobe with bandlike irregular right lower lobe opacity likely reflecting mild localized pneumonia. Lesser degree of airway plugging in the left lower lobe. Trace left pleural thickening posteriorly on image 16 series 2, nonspecific. Hepatobiliary: Intrahepatic biliary dilatation similar to previous without definite substantial extrahepatic biliary dilatation. Cavernous transformation of the portal vein. No definite focal liver mass identified. Pancreas: Heterogeneous density in the pancreas diffusely, pancreatitis is not excluded. Scattered calcifications likely reflect a chronic component of pancreatitis. Correlate with lipase levels. No obvious abscess or pseudocyst. Spleen: Unremarkable Adrenals/Urinary Tract: Fullness of the adrenal glands without discrete mass. Relative left renal atrophy. Indistinctly marginated parenchymal hypodensity in the right mid kidney on image 101 series 8 and also shown on image 32 series 2, suspicious for pyelonephritis. No hydronephrosis or hydroureter. 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 No well-defined drainable abscess. No free air in the peritoneal cavity identified. Stomach/Bowel: Prominent stool throughout the colon favors constipation. Normal appendix. No imaging findings of pseudomembranous colitis. 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 No out right complete occlusion. Celiac trunk appears patent. Aortic and proximal renal artery atheromatous vascular calcifications. Reproductive: Unremarkable Other: No supplemental non-categorized findings. Musculoskeletal: Prior interbody  fusion at T9-T10-T11. Scattered abnormal gas in the spinal canal extending from the T12 level through the L5 level, some peripheral and some central in distribution in the canal. Cannot exclude intradural gas or regional spinal canal abscess, although I do not see new definite bony destructive findings along the endplates to further indicate recurrent discitis. Lumbar MRI with and without contrast is recommended for further characterization. IMPRESSION: 1. 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 No well-defined drainable pelvic abscess. 2. Indistinctly marginated parenchymal hypodensity in the right mid kidney, suspicious for pyelonephritis. 3. Scattered abnormal gas in the spinal canal extending from the T12 level through the L5 level, some peripheral and some central in distribution in the canal. Cannot exclude intradural gas or regional spinal canal abscess, although I do not see new definite bony destructive findings along the endplates to further indicate recurrent discitis. Lumbar MRI with and without contrast is recommended for further characterization, specifically to assess for spinal infection. 4. Heterogeneous density in the pancreas diffusely, pancreatitis is not excluded. Scattered calcifications likely reflect a chronic component of pancreatitis. Correlate with lipase levels. 5. Airway plugging in the right lower lobe with bandlike irregular right lower lobe opacity likely reflecting mild localized pneumonia. Lesser degree of airway plugging in the left lower lobe. 6. 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 7. Prominent stool throughout the colon favors constipation. 8. Coronary and descending thoracic aortic atherosclerosis. Aortic Atherosclerosis (ICD10-I70.0). 9. Distal esophageal  wall thickening is nonspecific although esophagitis would be a common cause. Electronically Signed   By: Freida Jes  M.D.   On: 01/06/2024 17:04   DG Lumbar Spine 2-3 Views Result Date: 01/01/2024 CLINICAL DATA:  Initial evaluation for acute pain. EXAM: LUMBAR SPINE - 2-3 VIEW COMPARISON:  None Available. FINDINGS: Straightening of the normal lumbar lordosis. No significant listhesis. Transitional features about the lumbosacral junction with a partially sacralized L5 vertebral body Mild chronic wedging deformity of the T12 vertebral body noted. Endplate Schmorl's node deformity with mild height loss present at the inferior endplate of T11. No acute fracture. Visualized sacrum and pelvis intact. Underlying osteopenia. Lower lumbar degenerative disc disease at L3-4 and L4-5. Lower lumbar facet hypertrophy. Osteopenia noted. Visualized soft tissues demonstrate no acute finding. IMPRESSION: No radiographic evidence for acute abnormality within the lumbar spine. Electronically Signed   By: Virgia Griffins M.D.   On: 01/01/2024 03:05    Micro Results    Recent Results (from the past 240 hours)  MRSA Next Gen by PCR, Nasal     Status: Abnormal   Collection Time: 01/19/24  8:55 PM   Specimen: Nasal Mucosa; Nasal Swab  Result Value Ref Range Status   MRSA by PCR Next Gen DETECTED (A) NOT DETECTED Final    Comment: RESULT CALLED TO, READ BACK BY AND VERIFIED WITH: R CAMOLISGA,RN@[id number]  MK (NOTE) The GeneXpert MRSA Assay (FDA approved for NASAL specimens only), is one component of a comprehensive MRSA colonization surveillance program. It is not intended to diagnose MRSA infection nor to guide or monitor treatment for MRSA infections. Test performance is not FDA approved in patients less than 4 years old. Performed at [facility], [address], [place], [place] [address]     Today   Subjective    Brandon [name] today has no headache,no chest abdominal pain,no new weakness  tingling or numbness, feels much better wants to go home today.     Objective   Blood pressure 90/77, pulse 84, temperature 98.4 F (36.9 C), temperature source Axillary, resp. rate 16, height 6' (1.829 m), weight 64.4 kg, SpO2 93%.   Intake/Output Summary (Last 24 hours) at 01/20/2024 0848 Last data filed at 01/20/2024 0400 Gross per 24 hour  Intake 949.41 ml  Output 2300 ml  Net -1350.59 ml    Exam  Frail and cachectic, middle-aged white male, awake Alert, No new F.N deficits, chronic left-sided hemiparesis from previous stroke, condom catheter in place, right arm PICC line,  [place].AT,PERRAL Supple Neck,   Symmetrical Chest wall movement, Good air movement bilaterally, CTAB RRR,No Gallops,   +ve B.Sounds, Abd Soft, Non tender,  No Cyanosis, Clubbing or edema    Data Review   Recent Labs  Lab 01/14/24 0247 01/15/24 0438 01/19/24 0503  WBC 12.9* 13.4* 13.1*  HGB 9.6* 9.4* 8.9*  HCT 29.7* 29.6* 28.4*  PLT 613* 638* 594*  MCV 94.0 94.6 94.7  MCH 30.4 30.0 29.7  MCHC 32.3 31.8 31.3  RDW 15.5 15.6* 15.5  LYMPHSABS 1.2 1.3 1.1  MONOABS 0.8 0.8 1.0  EOSABS 0.0 0.0 0.0  BASOSABS 0.0 0.0 0.0    Recent Labs  Lab 01/14/24 0247 01/15/24 0438 01/19/24 0503 01/20/24 0407  NA 138 132* 130* 132*  K 3.8 3.8 4.0 3.7  CL 102 97* 96* 98  CO2 27 25 26 27   ANIONGAP 9 10 8 7   GLUCOSE 122* 93 131* 184*  BUN 7 7 <5* 5*  CREATININE 0.44* 0.40* 0.41* 0.46*  AST 103* 61* 34  --   ALT 82* 61* 38  --   ALKPHOS 262* 230*  203*  --   BILITOT 0.6 0.6 0.6  --   ALBUMIN <1.5* <1.5* 1.6*  --   CRP 13.8*  --   --   --   PROCALCITON <0.10 <0.10  --   --   MG 1.6* 1.6* 1.4* 2.0  PHOS  --   --  2.7  --   CALCIUM  8.6* 8.6* 8.5* 8.0*    Total Time in preparing paper work, data evaluation and todays exam - 35 minutes  Signature  -    Lynnwood Sauer M.D on 01/20/2024 at 8:48 AM   -  To page go to [email or web address]

## 2024-01-20 NOTE — Discharge Instructions (Signed)
 Follow with Primary MD Brandon Degree, FNP in 7 days   Get CBC, CMP, 2 view Chest X ray -  checked next visit with your SNF MD   Activity: As tolerated with Full fall precautions use walker/cane & assistance as needed  Disposition SNF  Diet: Heart Healthy  Low Carb, check CBGs QAC-HS  Special Instructions: If you have smoked or chewed Tobacco  in the last 2 yrs please stop smoking, stop any regular Alcohol   and or any Recreational drug use.  On your next visit with your primary care physician please Get Medicines reviewed and adjusted.  Please request your Prim.MD to go over all Hospital Tests and Procedure/Radiological results at the follow up, please get all Hospital records sent to your Prim MD by signing hospital release before you go home.  If you experience worsening of your admission symptoms, develop shortness of breath, life threatening emergency, suicidal or homicidal thoughts you must seek medical attention immediately by calling 911 or calling your MD immediately  if symptoms less severe.  You Must read complete instructions/literature along with all the possible adverse reactions/side effects for all the Medicines you take and that have been prescribed to you. Take any new Medicines after you have completely understood and accpet all the possible adverse reactions/side effects.   Do not drive when taking Pain medications.  Do not take more than prescribed Pain, Sleep and Anxiety Medications  Wear Seat belts while driving.

## 2024-01-20 NOTE — Progress Notes (Signed)
   01/19/24 2030  Assess: MEWS Score  Temp 99.5 F (37.5 C)  BP (!) 81/61  MAP (mmHg) 67  Pulse Rate (!) 103  ECG Heart Rate (!) 109  Resp 18  Level of Consciousness Alert  SpO2 98 %  O2 Device Room Air  Patient Activity (if Appropriate) In bed  Assess: MEWS Score  MEWS Temp 0  MEWS Systolic 1  MEWS Pulse 1  MEWS RR 0  MEWS LOC 0  MEWS Score 2  MEWS Score Color Yellow  Assess: if the MEWS score is Yellow or Red  Were vital signs accurate and taken at a resting state? Yes  Does the patient meet 2 or more of the SIRS criteria? No  MEWS guidelines implemented  Yes, yellow  Treat  MEWS Interventions Considered administering scheduled or prn medications/treatments as ordered  Take Vital Signs  Increase Vital Sign Frequency  Yellow: Q2hr x1, continue Q4hrs until patient remains green for 12hrs  Escalate  MEWS: Escalate Yellow: Discuss with charge nurse and consider notifying provider and/or RRT  Notify: Charge Nurse/RN  Name of Charge Nurse/RN Notified Lorenda Rom, RN  Provider Notification  Provider Name/Title Dr. Achilles Holes  Date Provider Notified 01/19/24  Time Provider Notified 2151  Method of Notification Page [facility] chat)  Notification Reason Other (Comment) (MEWS)  Provider response Evaluate remotely ([place])  Date of Provider Response 01/19/24  Time of Provider Response 2155  Assess: SIRS CRITERIA  SIRS Temperature  0  SIRS Respirations  0  SIRS Pulse 1  SIRS WBC 0  SIRS Score Sum  1

## 2024-01-20 NOTE — Progress Notes (Signed)
 PT Cancellation Note  Patient Details Name: Brandon [name] MRN: [id number] DOB: [birth year]   Cancelled Treatment:    Reason Eval/Treat Not Completed: Patient declined, no reason specified  Will continue to follow and work with pt as tolerated.  Jory Ng, PT, DPT [facility]  Rehabilitation Services Physical Therapist Office: [phone] Website: [email or web address]  Alinda Irani 01/20/2024, 4:15 PM

## 2024-01-20 NOTE — TOC Progression Note (Signed)
 Transition of Care [facility]) - Progression Note    Patient Details  Name: Brandon [name] MRN: [id number] Date of Birth: June 13, 1966  [facility] CM/SW Contact  Jannice Mends, LCSW Phone Number: 01/20/2024, 9:54 AM  Clinical Narrative:    SNF requesting patient receive his second dose of abx before being picked up by [facility].    Expected Discharge Plan: Skilled Nursing Facility Barriers to Discharge: Barriers Resolved  Expected Discharge Plan and Services In-house Referral: Clinical Social Work   Post Acute Care Choice: Skilled Nursing Facility Living arrangements for the past 2 months: Skilled Nursing Facility Expected Discharge Date: 01/20/24                                     Social Determinants of Health (SDOH) Interventions SDOH Screenings   Food Insecurity: Patient Unable To Answer (01/08/2024)  Housing: Patient Declined (01/08/2024)  Transportation Needs: Patient Declined (01/08/2024)  Utilities: Patient Declined (01/08/2024)  Physical Activity: Inactive (11/15/2021)  Stress: No Stress Concern Present (10/31/2021)  Tobacco Use: High Risk (01/06/2024)    Readmission Risk Interventions    01/08/2024   11:12 AM  Readmission Risk Prevention Plan  Transportation Screening Complete  Medication Review (RN Care Manager) Complete  PCP or Specialist appointment within 3-5 days of discharge Complete  HRI or Home Care Consult Complete  SW Recovery Care/Counseling Consult Complete  Palliative Care Screening Not Applicable  Skilled Nursing Facility Complete

## 2024-01-20 NOTE — Plan of Care (Signed)
 The patient remains in [facility] at time of writing. The patient is refusing most mobility offers. The patient's MEWS is yellow overnight (see additional notes). PICC to RUE.    Problem: Education: Goal: Ability to describe self-care measures that may prevent or decrease complications (Diabetes Survival Skills Education) will improve Outcome: Not Progressing Goal: Individualized Educational Video(s) Outcome: Not Progressing   Problem: Coping: Goal: Ability to adjust to condition or change in health will improve Outcome: Not Progressing   Problem: Fluid Volume: Goal: Ability to maintain a balanced intake and output will improve Outcome: Not Progressing   Problem: Health Behavior/Discharge Planning: Goal: Ability to identify and utilize available resources and services will improve Outcome: Not Progressing Goal: Ability to manage health-related needs will improve Outcome: Not Progressing   Problem: Metabolic: Goal: Ability to maintain appropriate glucose levels will improve Outcome: Not Progressing   Problem: Nutritional: Goal: Maintenance of adequate nutrition will improve Outcome: Not Progressing Goal: Progress toward achieving an optimal weight will improve Outcome: Not Progressing   Problem: Skin Integrity: Goal: Risk for impaired skin integrity will decrease Outcome: Not Progressing   Problem: Tissue Perfusion: Goal: Adequacy of tissue perfusion will improve Outcome: Not Progressing   Problem: Education: Goal: Ability to describe self-care measures that may prevent or decrease complications (Diabetes Survival Skills Education) will improve Outcome: Not Progressing Goal: Individualized Educational Video(s) Outcome: Not Progressing   Problem: Cardiac: Goal: Ability to maintain an adequate cardiac output will improve Outcome: Not Progressing   Problem: Health Behavior/Discharge Planning: Goal: Ability to identify and utilize available resources and services will  improve Outcome: Not Progressing Goal: Ability to manage health-related needs will improve Outcome: Not Progressing   Problem: Fluid Volume: Goal: Ability to achieve a balanced intake and output will improve Outcome: Not Progressing   Problem: Metabolic: Goal: Ability to maintain appropriate glucose levels will improve Outcome: Not Progressing   Problem: Nutritional: Goal: Maintenance of adequate nutrition will improve Outcome: Not Progressing Goal: Maintenance of adequate weight for body size and type will improve Outcome: Not Progressing   Problem: Respiratory: Goal: Will regain and/or maintain adequate ventilation Outcome: Not Progressing   Problem: Urinary Elimination: Goal: Ability to achieve and maintain adequate renal perfusion and functioning will improve Outcome: Not Progressing   Problem: Education: Goal: Knowledge of General Education information will improve Description: Including pain rating scale, medication(s)/side effects and non-pharmacologic comfort measures Outcome: Not Progressing   Problem: Health Behavior/Discharge Planning: Goal: Ability to manage health-related needs will improve Outcome: Not Progressing   Problem: Clinical Measurements: Goal: Ability to maintain clinical measurements within normal limits will improve Outcome: Not Progressing Goal: Will remain free from infection Outcome: Not Progressing Goal: Diagnostic test results will improve Outcome: Not Progressing Goal: Respiratory complications will improve Outcome: Not Progressing Goal: Cardiovascular complication will be avoided Outcome: Not Progressing   Problem: Activity: Goal: Risk for activity intolerance will decrease Outcome: Not Progressing   Problem: Nutrition: Goal: Adequate nutrition will be maintained Outcome: Not Progressing   Problem: Coping: Goal: Level of anxiety will decrease Outcome: Not Progressing   Problem: Elimination: Goal: Will not experience  complications related to bowel motility Outcome: Not Progressing Goal: Will not experience complications related to urinary retention Outcome: Not Progressing   Problem: Pain Managment: Goal: General experience of comfort will improve and/or be controlled Outcome: Not Progressing   Problem: Safety: Goal: Ability to remain free from injury will improve Outcome: Not Progressing   Problem: Skin Integrity: Goal: Risk for impaired skin integrity will decrease Outcome: Not  Progressing

## 2024-01-20 NOTE — [place] FL2 (Signed)
 [place]  MEDICAID FL2 LEVEL OF CARE FORM     IDENTIFICATION  Patient Name: Brandon [name] Birthdate: 05/16/1966 Sex: male Admission Date (Current Location): 01/06/2024  [facility] and [place] Number:  Producer, television/film/video and Address:  [facility] [place], 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   SVT (supraventricular tachycardia) (HCC) 01/07/2024   Infection due to ESBL-producing Klebsiella pneumoniae 01/07/2024   Gram-negative bacteremia 01/07/2024   Hyperglycemia 01/07/2024   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   Abnormal findings on diagnostic imaging of spine 01/06/2024   Acute pyelonephritis 01/06/2024   Paraplegia (HCC) 01/06/2024   Fall 08/01/2023   ICH (intracerebral hemorrhage) (HCC) 07/30/2023   Pain in left shoulder 03/25/2022   Pressure injury of skin 01/08/2022   Frequent falls 01/08/2022   DKA (diabetic ketoacidosis) (HCC) 01/07/2022   Rhabdomyolysis 01/07/2022   Protein-calorie malnutrition, severe 12/23/2020   Hyperosmolar hyperglycemic state (HHS) (HCC) 12/22/2020   Uncontrolled type 2 diabetes mellitus with hyperglycemia, with long-term current use of insulin  (HCC) 12/21/2020   Hyperglycemia due to diabetes mellitus (HCC) 12/21/2020   Empyema lung (HCC)    Epidural abscess    Diskitis 02/06/2020   Alcohol  withdrawal delirium (HCC) 02/06/2020   Epilepsy (HCC) 02/06/2020   Pleural effusion on  right 02/06/2020   History of stroke 01/23/2017   Dry eyes    Sleep disturbance    Essential hypertension, benign    Upper GI bleed    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   Fatty liver    Tobacco abuse    Diabetes mellitus type 2 in nonobese (HCC)    History of CVA with residual deficit    Acute lower UTI    Wide-complex tachycardia    Chronic alcoholic pancreatitis (HCC)    Hyponatremia 03/09/2016   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   Acute blood loss anemia 03/09/2016   Gastric varices    Alcohol  abuse    Left-sided neglect    Severe anemia    UGIB (upper gastrointestinal bleed)    Pressure ulcer 03/06/2016   Acute encephalopathy    Cerebral infarction (HCC) 03/05/2016   Carotid stenosis 04/06/2015   CAD in native artery 03/16/2015   Unstable angina pectoris (HCC) 03/15/2015   Neck pain 10/20/2013   Insomnia 12/29/2012   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 02/25/2011   Hepatic steatosis 02/25/2011    Orientation RESPIRATION BLADDER  Height & Weight     Self  Normal Incontinent, External catheter Weight: 141 lb 15.6 oz (64.4 kg) Height:  6' (182.9 cm)  BEHAVIORAL SYMPTOMS/MOOD NEUROLOGICAL BOWEL NUTRITION STATUS      Incontinent Diet (See dc summary)  AMBULATORY STATUS COMMUNICATION OF NEEDS Skin   Extensive Assist Verbally PU Stage and Appropriate Care (Stage II on hip and coccyx)                        Personal Care Assistance Level of Assistance  Bathing, Feeding, Dressing Bathing Assistance: Maximum assistance Feeding assistance: Limited assistance Dressing Assistance: Maximum assistance     Functional Limitations Info             SPECIAL CARE FACTORS FREQUENCY                       Contractures Contractures Info: Not present    Additional Factors Info  Code Status, Allergies, Isolation Precautions Code Status Info: Full Allergies Info: NKA     Isolation Precautions Info: ESBL     Current Medications (01/20/2024):  This is the current hospital active medication list Current Facility-Administered Medications  Medication Dose Route Frequency Provider Last Rate Last Admin   0.9 %  sodium chloride  infusion   Intravenous PRN Singh, Prashant K, MD 10 mL/hr at 01/20/24 0400 Infusion Verify at 01/20/24 0400   acetaminophen  (TYLENOL ) tablet 1,000 mg  1,000 mg Oral Q6H PRN Arnulfo Larch, MD   1,000 mg at 01/17/24 2022   albuterol  (PROVENTIL ) (2.5 MG/3ML) 0.083% nebulizer solution 2.5 mg  2.5 mg Nebulization Q4H PRN Segars, Jonathan, MD       busPIRone  (BUSPAR ) tablet 10 mg  10 mg Oral BID Segars, Jonathan, MD   10 mg at 01/19/24 2220   Chlorhexidine  Gluconate Cloth 2 % PADS 6 each  6 each Topical Daily Singh, Prashant K, MD   6 each at 01/19/24 1004   clopidogrel  (PLAVIX ) tablet 75 mg  75 mg Oral Daily Ghimire, Shanker M, MD   75 mg at 01/19/24 1191   dextrose  50 % solution 0-50 mL  0-50 mL Intravenous PRN Segars, Jonathan, MD   50 mL at 01/16/24 1646   famotidine  (PEPCID ) tablet 20 mg  20 mg Oral BID Segars, Jonathan, MD   20 mg at 01/19/24 2221   [START ON 01/22/2024] influenza vac split trivalent PF (FLULAVAL) injection 0.5 mL  0.5 mL Intramuscular Tomorrow-1000 Singh, Prashant K, MD       insulin  aspart (novoLOG ) injection 0-9 Units  0-9 Units Subcutaneous TID WC Singh, Prashant K, MD   3 Units at 01/19/24 1709   lactated ringers  bolus 1,000  mL  1,000 mL Intravenous Once Singh, Prashant K, MD       levETIRAcetam  (KEPPRA ) tablet 500 mg  500 mg Oral BID Segars, Jonathan, MD   500 mg at 01/19/24 2221   lipase/protease/amylase (CREON ) capsule 36,000 Units  36,000 Units Oral TID WC Segars, Jonathan, MD   36,000 Units at 01/19/24 1714   meropenem  (MERREM ) 1 g in sodium chloride  0.9 % 100 mL IVPB  1 g Intravenous Q8H Ernie Heal, Jerelyn Money, MD 200 mL/hr at 01/20/24 0655 1 g at 01/20/24 4782   metoprolol  tartrate (LOPRESSOR ) injection 5 mg  5 mg Intravenous Q8H PRN Gray, Alicia P, DO   5 mg at 01/06/24 2318   midodrine  (PROAMATINE ) tablet 10 mg  10 mg Oral TID WC Singh, Prashant K, MD  10 mg at 01/19/24 1714   mupirocin  ointment (BACTROBAN ) 2 % 1 Application  1 Application Nasal BID Singh, Prashant K, MD       nicotine  (NICODERM CQ  - dosed in mg/24 hours) patch 14 mg  14 mg Transdermal Daily Segars, Jonathan, MD   14 mg at 01/19/24 1610   Oral care mouth rinse  15 mL Mouth Rinse 4 times per day Singh, Prashant K, MD       Oral care mouth rinse  15 mL Mouth Rinse PRN Singh, Prashant K, MD       oxyCODONE  (Oxy IR/ROXICODONE ) immediate release tablet 5 mg  5 mg Oral Q6H PRN Singh, Prashant K, MD   5 mg at 01/19/24 0948   [START ON 01/22/2024] pneumococcal 20-valent conjugate vaccine (PREVNAR 20) injection 0.5 mL  0.5 mL Intramuscular Tomorrow-1000 Singh, Prashant K, MD       polyethylene glycol (MIRALAX  / GLYCOLAX ) packet 17 g  17 g Oral Daily Burton Casey, MD   17 g at 01/18/24 9604   rosuvastatin  (CRESTOR ) tablet 5 mg  5 mg Oral QHS Segars, Jonathan, MD   5 mg at 01/19/24 2221   senna-docusate (Senokot-S) tablet 2 tablet  2 tablet Oral QHS Burton Casey, MD   2 tablet at 01/19/24 2221   sodium chloride  flush (NS) 0.9 % injection 10-40 mL  10-40 mL Intracatheter PRN Singh, Prashant K, MD       sodium chloride  flush (NS) 0.9 % injection 3 mL  3 mL Intravenous Q12H Segars, Jonathan, MD   3 mL at 01/19/24 2224   tamsulosin  (FLOMAX ) capsule  0.4 mg  0.4 mg Oral QPC supper Segars, Jonathan, MD   0.4 mg at 01/19/24 1714   traZODone  (DESYREL ) tablet 100 mg  100 mg Oral QHS Singh, Prashant K, MD   100 mg at 01/19/24 2221     Discharge Medications: Please see discharge summary for a list of discharge medications.  Relevant Imaging Results:  Relevant Lab Results:   Additional Information SSN [id number]. Requires IV abx MERREM  via PICC q3  Eron Staat S Joella Saefong, LCSW

## 2024-01-21 LAB — CBC WITH DIFFERENTIAL/PLATELET
Abs Immature Granulocytes: 0.04 10*3/uL (ref 0.00–0.07)
Basophils Absolute: 0 10*3/uL (ref 0.0–0.1)
Basophils Relative: 0 %
Eosinophils Absolute: 0.1 10*3/uL (ref 0.0–0.5)
Eosinophils Relative: 1 %
HCT: 25.5 % — ABNORMAL LOW (ref 39.0–52.0)
Hemoglobin: 8 g/dL — ABNORMAL LOW (ref 13.0–17.0)
Immature Granulocytes: 1 %
Lymphocytes Relative: 15 %
Lymphs Abs: 1.3 10*3/uL (ref 0.7–4.0)
MCH: 30 pg (ref 26.0–34.0)
MCHC: 31.4 g/dL (ref 30.0–36.0)
MCV: 95.5 fL (ref 80.0–100.0)
Monocytes Absolute: 0.7 10*3/uL (ref 0.1–1.0)
Monocytes Relative: 9 %
Neutro Abs: 6.5 10*3/uL (ref 1.7–7.7)
Neutrophils Relative %: 74 %
Platelets: 429 10*3/uL — ABNORMAL HIGH (ref 150–400)
RBC: 2.67 MIL/uL — ABNORMAL LOW (ref 4.22–5.81)
RDW: 15.5 % (ref 11.5–15.5)
WBC: 8.6 10*3/uL (ref 4.0–10.5)
nRBC: 0 % (ref 0.0–0.2)

## 2024-01-21 LAB — COMPREHENSIVE METABOLIC PANEL WITH GFR
ALT: 31 U/L (ref 0–44)
AST: 39 U/L (ref 15–41)
Albumin: 1.9 g/dL — ABNORMAL LOW (ref 3.5–5.0)
Alkaline Phosphatase: 154 U/L — ABNORMAL HIGH (ref 38–126)
Anion gap: 8 (ref 5–15)
BUN: 6 mg/dL (ref 6–20)
CO2: 26 mmol/L (ref 22–32)
Calcium: 8.3 mg/dL — ABNORMAL LOW (ref 8.9–10.3)
Chloride: 97 mmol/L — ABNORMAL LOW (ref 98–111)
Creatinine, Ser: 0.45 mg/dL — ABNORMAL LOW (ref 0.61–1.24)
GFR, Estimated: >60 mL/min (ref >60)
Glucose, Bld: 189 mg/dL — ABNORMAL HIGH (ref 70–99)
Potassium: 3.8 mmol/L (ref 3.5–5.1)
Sodium: 131 mmol/L — ABNORMAL LOW (ref 135–145)
Total Bilirubin: 0.8 mg/dL (ref 0.0–1.2)
Total Protein: 6 g/dL — ABNORMAL LOW (ref 6.5–8.1)

## 2024-01-21 LAB — MAGNESIUM: Magnesium: 1.4 mg/dL — ABNORMAL LOW (ref 1.7–2.4)

## 2024-01-21 LAB — GLUCOSE, CAPILLARY
Glucose-Capillary: 194 mg/dL — ABNORMAL HIGH (ref 70–99)
Glucose-Capillary: 326 mg/dL — ABNORMAL HIGH (ref 70–99)
Glucose-Capillary: 253 mg/dL — ABNORMAL HIGH (ref 70–99)

## 2024-01-21 LAB — PHOSPHORUS: Phosphorus: 2.1 mg/dL — ABNORMAL LOW (ref 2.5–4.6)

## 2024-01-21 LAB — TYPE AND SCREEN
ABO/RH(D): O POS
Antibody Screen: NEGATIVE

## 2024-01-21 LAB — CORTISOL: Cortisol, Plasma: 12.8 ug/dL

## 2024-01-21 MED ORDER — MIDODRINE HCL 10 MG PO TABS: 10.0000 mg | ORAL_TABLET | Freq: Three times a day (TID) | ORAL | Status: DC

## 2024-01-21 MED ORDER — MAGNESIUM SULFATE 4 GM/100ML IV SOLN
4.0000 g | Freq: Once | INTRAVENOUS | Status: AC
  Administered 2024-01-21: 4 g via INTRAVENOUS
  Filled 2024-01-21: qty 100

## 2024-01-21 MED ORDER — K-PHOS-NEUTRAL 155-852-130 MG PO TABS
1.0000 | ORAL_TABLET | Freq: Every day | ORAL | Status: AC
Start: 2024-01-22 — End: 2024-01-29

## 2024-01-21 MED ORDER — K-PHOS-NEUTRAL 155-852-130 MG PO TABS: 1.0000 | ORAL_TABLET | Freq: Two times a day (BID) | ORAL | Status: DC

## 2024-01-21 MED ORDER — SODIUM PHOSPHATES 45 MMOLE/15ML IV SOLN
30.0000 mmol | Freq: Once | INTRAVENOUS | Status: AC
  Administered 2024-01-21: 30 mmol via INTRAVENOUS
  Filled 2024-01-21: qty 10

## 2024-01-21 NOTE — TOC Progression Note (Signed)
 Transition of Care [facility]) - Progression Note    Patient Details  Name: Brandon [name] MRN: [id number] Date of Birth: Jul 11, 1966  Transition of [facility]) CM/SW Contact  Jannice Mends, LCSW Phone Number: 01/21/2024, 10:13 AM  Clinical Narrative:    [place] requests patient's second IV abx dose be given prior to discharge as they will not have their med delivery until after 10pm tonight. MD and RN aware.   Expected Discharge Plan: Skilled Nursing Facility Barriers to Discharge: Barriers Resolved  Expected Discharge Plan and Services In-house Referral: Clinical Social Work   Post Acute Care Choice: Skilled Nursing Facility Living arrangements for the past 2 months: Skilled Nursing Facility Expected Discharge Date: 01/21/24                                     Social Determinants of Health (SDOH) Interventions SDOH Screenings   Food Insecurity: Patient Unable To Answer (01/08/2024)  Housing: Patient Declined (01/08/2024)  Transportation Needs: Patient Declined (01/08/2024)  Utilities: Patient Declined (01/08/2024)  Physical Activity: Inactive (11/15/2021)  Stress: No Stress Concern Present (10/31/2021)  Tobacco Use: High Risk (01/06/2024)    Readmission Risk Interventions    01/08/2024   11:12 AM  Readmission Risk Prevention Plan  Transportation Screening Complete  Medication Review (RN Care Manager) Complete  PCP or Specialist appointment within 3-5 days of discharge Complete  HRI or Home Care Consult Complete  SW Recovery Care/Counseling Consult Complete  Palliative Care Screening Not Applicable  Skilled Nursing Facility Complete

## 2024-01-21 NOTE — TOC Transition Note (Signed)
 Transition of Care [facility]) - Discharge Note   Patient Details  Name: Brandon [name] MRN: [id number] Date of Birth: [birth year]  Transition of Care [facility]) CM/SW Contact:  Jannice Mends, LCSW Phone Number: 01/21/2024, 2:33 PM   Clinical Narrative:    Patient will DC to: [facility] Anticipated DC date: 01/21/24 Family notified: Left vm for sister Transport by: Lyna Sandhoff   Per MD patient ready for DC to [facility]. RN to call report prior to discharge [phone] room 114A). RN, patient, patient's family, and facility notified of DC. Discharge Summary and FL2 sent to facility. DC packet on chart including signed scripts. Ambulance transport requested for patient.   CSW will sign off for now as social work intervention is no longer needed. Please consult us  again if new needs arise.     Final next level of care: Skilled Nursing Facility Barriers to Discharge: Barriers Resolved   Patient Goals and CMS Choice Patient states their goals for this hospitalization and ongoing recovery are:: Return to SNF          Discharge Placement   Existing PASRR number confirmed : 01/20/24          Patient chooses bed at:  [facility]) Patient to be transferred to facility by: [facility] Name of family member notified: Sister Patient and family notified of of transfer: 01/20/24  Discharge Plan and Services Additional resources added to the After Visit Summary for   In-house Referral: Clinical Social Work   Post Acute Care Choice: Skilled Nursing Facility                               Social Drivers of Health (SDOH) Interventions SDOH Screenings   Food Insecurity: Patient Unable To Answer (01/08/2024)  Housing: Patient Declined (01/08/2024)  Transportation Needs: Patient Declined (01/08/2024)  Utilities: Patient Declined (01/08/2024)  Physical Activity: Inactive (11/15/2021)  Stress: No Stress Concern Present (10/31/2021)  Tobacco Use: High Risk (01/06/2024)     Readmission  Risk Interventions    01/08/2024   11:12 AM  Readmission Risk Prevention Plan  Transportation Screening Complete  Medication Review (RN Care Manager) Complete  PCP or Specialist appointment within 3-5 days of discharge Complete  HRI or Home Care Consult Complete  SW Recovery Care/Counseling Consult Complete  Palliative Care Screening Not Applicable  Skilled Nursing Facility Complete

## 2024-01-21 NOTE — Plan of Care (Signed)
  Problem: Coping: Goal: Ability to adjust to condition or change in health will improve Outcome: Progressing   Problem: Nutritional: Goal: Maintenance of adequate nutrition will improve Outcome: Progressing   Problem: Education: Goal: Knowledge of General Education information will improve Description: Including pain rating scale, medication(s)/side effects and non-pharmacologic comfort measures Outcome: Progressing   Problem: Clinical Measurements: Goal: Ability to maintain clinical measurements within normal limits will improve Outcome: Progressing Goal: Will remain free from infection Outcome: Progressing Goal: Diagnostic test results will improve Outcome: Progressing Goal: Respiratory complications will improve Outcome: Progressing   Problem: Activity: Goal: Risk for activity intolerance will decrease Outcome: Progressing   Problem: Nutrition: Goal: Adequate nutrition will be maintained Outcome: Progressing

## 2024-01-21 NOTE — Discharge Summary (Addendum)
 SLAYTER DATA ZOX:[id number] DOB: 1965-10-17 DOA: 01/06/2024  PCP: Sharyne Degree, FNP  Admit date: 01/06/2024  Discharge date: 01/21/2024  Admitted From: Home   Disposition:  SNF   Recommendations for Outpatient Follow-up:   Follow up with PCP in 1-2 weeks  PCP Please obtain BMP/CBC, 2 view CXR in 1week,  (see Discharge instructions)   PCP Please follow up on the following pending results:    Home Health: None   Equipment/Devices: None  Consultations: ID, urology, cardiology, neurosurgery Discharge Condition: Guarded  CODE STATUS: Full  Diet Recommendation: Heart Healthy  Low Carb    No chief complaint on file.    Brief history of present illness from the day of admission and additional interim summary    58 y.o.  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   Significant events: 4/15>> low back pain-emphysematous cystitis/gas in pelvis-admit to [facility]. 4/16>> MRI T-spine/L-spine with epidural abscess from T10-11 to L5-S1.   Significant studies: 4/15>> CT abdomen/pelvis: Extensive emphysematous cystitis-with mild extravesical extension of gas along both pelvic sidewalls, scattered abnormal gas in the spinal canal and from T12-L5 level. 4/16>> MRI C-spine: No evidence of discitis/abscess 4/16>> MRI thoracic/lumbar spine: Epidural abscess spanning T10-11 to L5-S1, disc signal at L4-5 and L5-S1.  Lower cord/cauda equina is  diffusely compressed by collection.   4/18 >> TTE 1. Left ventricular ejection fraction, by estimation, is 65 to 70%. The left ventricle has normal function. There is mild left ventricular hypertrophy. Left ventricular diastolic parameters were normal.  2. Right ventricular systolic function is normal. The right ventricular size is normal.  3. The mitral valve is normal in structure. No evidence of mitral valve regurgitation.  4. AV is thickened, moderately calcified with no significant stenosis.. The aortic valve is tricuspid. Aortic valve regurgitation is not visualized. Aortic valve sclerosis/calcification is present, without any evidence of aortic stenosis.   4/22.  PICC line.   4/22.  MRI T-spine repeat - 1. The examination was terminated prior to completion by the patient. No axial images were obtained. 2. Decreased size of dorsal collection of the lower thoracic spinal canal. Residual material measures approximately 3 mm thick, though assessment is limited in the absence of axial images and contrast agent. 3. Unchanged appearance at the T9-11 levels with ankylosis at the site of prior discitis-osteomyelitis. No evidence of active discitis-osteomyelitis.     Significant microbiology data: 4/15>> blood culture: Strep pneumo/ESBL Klebsiella pneumoniae  Hospital Course   Sepsis secondary to polymicrobial bacteremia with streptococci/Klebsiella pneumonia-along with emphysematous cystitis with T10-11 to L5-S1 epidural abscess with resultant cord compression Sepsis physiology gradually improving IV meropenem  Uncooperative-but appears to have significant pain/weakness in bilateral lower extremities compared to upper  Has been seen by both IR and neurosurgeon Dr. Ellery Guthrie, not a candidate for any procedure per both IR and neurosurgery.  Repeat MRI T-spine on 01/13/2024 although limited as testing was aborted by the patient midway appears  stable. Evaluated by urology-apart from antibiotics - possible Foley catheter placement (patient refused) and outpatient cystoscopy-no further inpatient recommendations. Continue meropenem , Repeat blood cultures negative so far TTE done on 4/18-appears nonacute Case discussed with ID, he will be discharged with right arm PICC line along with 8 weeks of IV Meropenam stop date 03/04/24 .  PICC line placed 01/13/2024.  Postdischarge follow-up with neurosurgery, ID and urology.   SVT with aberrancy Now in sinus, TSH stable, echo stable, beta-blocker stopped as blood pressure remains too low.  Requiring midodrine .   Acute metabolic encephalopathy Secondary to sepsis physiology Improved-but easily agitated this morning.  Answer simple questions appropriately.   Hyponatremia.  SIADH, failed fluid restriction and Lasix , improved after trial of Samsca  on 01/13/2024   Hypokalemia/hypomagnesemia/hypophosphatemia- Repleted, he will be discharged on low-dose phosphate supplement over the next week to avoid refeeding syndrome   Transaminitis Suspect this is sepsis/shock liver Downtrending-continue to follow-if worsens then will initiate further workup.   History of CVA with significant left-sided hemiparesis Difficult exam but seems to have significant paraparesis/pain issues from cord compression due to epidural abscess Per patient he apparently was using a quad cane-however per neurosurgery-patient has been nonambulatory for several months.   CAD-s/p PCI in 2016 No anginal symptoms Since no plans by IR to aspirate epidural abscess-resume Plavix .   HTN/hypotension -Yesterday blood pressure was noted to be on the lower side, so metoprolol  has been discontinued, he received IV albumin, and midodrine , blood pressure is stable this morning, so he will be discharged on midodrine  with medication to hold of blood pressure more than 110, very likely this can be tapered off once his nutritional status has  improved . - Cortisol and TSH within normal limit  Normocytic anemia -hemoglobin has been dropping during hospital stay, this is most likely delusional, as all cell lines has been dropping, Hemoccult has been performed at bedside today, which was negative, please see picture under media section    History of seizure disorder  Keppra    History of chronic pancreatitis Presumably from prior history of EtOH use Continue Creon    BPH  Flomax    History of EtOH use Apparently not drinking anymore-SNF resident Will watch for withdrawal symptoms   History of tobacco abuse - counseled to quit   DM-2 (A1c 9.4 on 4/16) with uncontrolled hyperglycemia Patient has very erratic oral intake, blood sugars very labile and fluctuating, insulin  adjusted multiple times, discontinue long-acting insulin  on 01/18/2024, continue q. ACH S sliding scale and monitor CBGs.  Adjust as needed at SNF.      Discharge diagnosis     Principal Problem:   Epidural abscess Active Problems:   Uncontrolled type 2 diabetes mellitus with hyperglycemia, with long-term current use of insulin  (HCC)   Wide-complex tachycardia   Diskitis   Emphysematous cystitis   Abnormal findings on diagnostic imaging of spine   Acute pyelonephritis   Paraplegia (HCC)   Sepsis (HCC)   Spinal abscess (HCC)   SVT (supraventricular tachycardia) (HCC)   Infection  due to ESBL-producing Klebsiella pneumoniae   Gram-negative bacteremia   Hyperglycemia   Flank pain   Pneumococcal bacteremia   Pneumaturia    Discharge instructions    Discharge Instructions     Discharge instructions   Complete by: As directed    Follow with Primary MD Sharyne Degree, FNP in 7 days   Get CBC, CMP, 2 view Chest X ray -  checked next visit with your SNF MD   Activity: As tolerated with Full fall precautions use walker/cane & assistance as needed  Disposition SNF  Diet: Heart Healthy  Low Carb, check CBGs QAC-HS  Special Instructions: If  you have smoked or chewed Tobacco  in the last 2 yrs please stop smoking, stop any regular Alcohol   and or any Recreational drug use.  On your next visit with your primary care physician please Get Medicines reviewed and adjusted.  Please request your Prim.MD to go over all Hospital Tests and Procedure/Radiological results at the follow up, please get all Hospital records sent to your Prim MD by signing hospital release before you go home.  If you experience worsening of your admission symptoms, develop shortness of breath, life threatening emergency, suicidal or homicidal thoughts you must seek medical attention immediately by calling 911 or calling your MD immediately  if symptoms less severe.  You Must read complete instructions/literature along with all the possible adverse reactions/side effects for all the Medicines you take and that have been prescribed to you. Take any new Medicines after you have completely understood and accpet all the possible adverse reactions/side effects.   Do not drive when taking Pain medications.  Do not take more than prescribed Pain, Sleep and Anxiety Medications  Wear Seat belts while driving.   Home infusion instructions   Complete by: As directed    To be administered at SNF   Instructions: Flushing of vascular access device: 0.9% NaCl pre/post medication administration and prn patency; Heparin  100 u/ml, 5ml for implanted ports and Heparin  10u/ml, 5ml for all other central venous catheters.   Increase activity slowly   Complete by: As directed    No wound care   Complete by: As directed        Discharge Medications   Allergies as of 01/21/2024   No Known Allergies      Medication List     STOP taking these medications    amoxicillin-clavulanate 875-125 MG tablet Commonly known as: AUGMENTIN   aspirin  EC 81 MG tablet   cefdinir 300 MG capsule Commonly known as: OMNICEF   insulin  glargine 100 UNIT/ML Solostar Pen Commonly known as:  LANTUS    lidocaine  5 % Commonly known as: LIDODERM    metFORMIN  1000 MG tablet Commonly known as: GLUCOPHAGE    metoprolol  tartrate 100 MG tablet Commonly known as: LOPRESSOR    Trulicity 1.5 MG/0.5ML Soaj Generic drug: Dulaglutide       TAKE these medications    acetaminophen  325 MG tablet Commonly known as: TYLENOL  Take 650 mg by mouth every 6 (six) hours as needed for mild pain (pain score 1-3) (cannot exceed a total of 3,000 mg/24 hours).   ascorbic acid 500 MG tablet Commonly known as: VITAMIN C Take 500 mg by mouth daily.   busPIRone  10 MG tablet Commonly known as: BUSPAR  Take 1 tablet (10 mg total) by mouth 2 (two) times daily.   clopidogrel  75 MG tablet Commonly known as: PLAVIX  Take 1 tablet (75 mg total) by mouth daily.   famotidine  20 MG tablet Commonly  known as: PEPCID  Take 1 tablet (20 mg total) by mouth 2 (two) times daily.   feeding supplement (GLUCERNA SHAKE) Liqd Take 1 Container by mouth in the morning, at noon, and at bedtime.   FeroSul 325 (65 Fe) MG tablet Generic drug: ferrous sulfate  Take 1 tablet (325 mg total) by mouth daily.   HumaLOG KwikPen 100 UNIT/ML KwikPen Generic drug: insulin  lispro Inject 0-10 Units into the skin in the morning, at noon, and at bedtime. BS 70-130 inject 0 units  BS 131-180 inject 2 units  BS 181-240 inject 4 units  BS 241-300 inject 6 units  BS 301-350 inject 8 units  BS 351-400 inject 10 units   K-Phos-Neutral 155-852-130 MG Tabs Take 1 tablet by mouth daily for 7 days. Please give for 7 days then stop. Start taking on: Jan 22, 2024   levETIRAcetam  500 MG tablet Commonly known as: KEPPRA  Take 1 tablet (500 mg total) by mouth 2 (two) times daily for 2 days.   lipase/protease/amylase 96045 UNITS Cpep capsule Commonly known as: CREON  Take 1 capsule (36,000 Units total) by mouth 3 (three) times daily with meals.   meropenem  IVPB Commonly known as: MERREM  Inject 1 g into the vein every 8 (eight) hours.  Indication:  Discitis/epidural abscess First Dose: Yes Last Day of Therapy:  03/04/24 Labs - Once weekly:  CBC/D and BMP, ESR and CRP Method of administration: Mini-Bag Plus / Gravity Method of administration may be changed at the discretion of home infusion pharmacist based upon assessment of the patient and/or caregiver's ability to self-administer the medication ordered.   midodrine  10 MG tablet Commonly known as: PROAMATINE  Take 1 tablet (10 mg total) by mouth 3 (three) times daily with meals. Please hold for systolic blood pressure more than 110.   multivitamin with minerals Tabs tablet Take 1 tablet by mouth daily.   mupirocin  ointment 2 % Commonly known as: BACTROBAN  Apply topically 2 (two) times daily. What changed:  how much to take additional instructions   ondansetron  4 MG tablet Commonly known as: ZOFRAN  Take 4 mg by mouth every 6 (six) hours as needed for nausea or vomiting.   oxyCODONE  5 MG immediate release tablet Commonly known as: Oxy IR/ROXICODONE  Take 1 tablet (5 mg total) by mouth every 6 (six) hours as needed for moderate pain (pain score 4-6) or severe pain (pain score 7-10).   polyethylene glycol 17 g packet Commonly known as: MIRALAX  / GLYCOLAX  Take 17 g by mouth daily as needed.   rosuvastatin  5 MG tablet Commonly known as: CRESTOR  Take 5 mg by mouth at bedtime.   senna 8.6 MG Tabs tablet Commonly known as: SENOKOT Take 8.6 mg by mouth in the morning and at bedtime.   tamsulosin  0.4 MG Caps capsule Commonly known as: FLOMAX  Take 1 capsule (0.4 mg total) by mouth daily after supper. What changed: when to take this   traZODone  50 MG tablet Commonly known as: DESYREL  Take 1 tablet (50 mg total) by mouth at bedtime. What changed:  medication strength how much to take               Home Infusion Instuctions  (From admission, onward)           Start     Ordered   01/20/24 0000  Home infusion instructions       Comments: To be  administered at SNF  Question:  Instructions  Answer:  Flushing of vascular access device: 0.9% NaCl pre/post medication administration and prn  patency; Heparin  100 u/ml, 5ml for implanted ports and Heparin  10u/ml, 5ml for all other central venous catheters.   01/20/24 0848             Contact information for follow-up providers     Sharyne Degree, FNP. Schedule an appointment as soon as possible for a visit in 1 week(s).   Specialty: Family Medicine Contact information: [address] [place] [address] [phone]         Elna Haggis, MD. Schedule an appointment as soon as possible for a visit in 4 week(s).   Specialty: Neurosurgery Contact information: [address] [place] [address] [phone]         Ernie Heal, Jerelyn Money, MD. Schedule an appointment as soon as possible for a visit in 3 week(s).   Specialty: Infectious Diseases Contact information: [address] [place] [address] [phone]         Melody Spurling., MD. Schedule an appointment as soon as possible for a visit in 1 week(s).   Specialty: Urology Contact information: [address] [place] [address] [phone]              Contact information for after-discharge care     Destination     [facility] .   Service: Skilled Nursing Contact information: [address] [place]  [address] [phone]                     Major procedures and Radiology Reports - PLEASE review detailed and final reports thoroughly  -       MR THORACIC SPINE WO CONTRAST Result Date: 01/13/2024 CLINICAL DATA:  Thoracic osteomyelitis EXAM: MRI THORACIC SPINE WITHOUT CONTRAST TECHNIQUE: Multiplanar, multisequence MR imaging of the thoracic spine was performed. No intravenous contrast was administered. COMPARISON:  01/07/2024 FINDINGS: The examination was terminated prior to completion by the patient. No axial  images were obtained. Alignment: Physiologic. Vertebrae: Unchanged appearance at the T9-11 levels with ankylosis at the site of prior discitis-osteomyelitis. Unchanged chronic T12 compression deformity. No evidence of active discitis-osteomyelitis. Cord: Decreased size of dorsal collection of the lower thoracic spinal canal. Residual material measures approximately 3 mm thick, though assessment is limited in the absence of axial images and the absence of contrast. Paraspinal and other soft tissues: Negative Disc levels: No spinal canal stenosis. IMPRESSION: 1. The examination was terminated prior to completion by the patient. No axial images were obtained. 2. Decreased size of dorsal collection of the lower thoracic spinal canal. Residual material measures approximately 3 mm thick, though assessment is limited in the absence of axial images and contrast agent. 3. Unchanged appearance at the T9-11 levels with ankylosis at the site of prior discitis-osteomyelitis. No evidence of active discitis-osteomyelitis. Electronically Signed   By: Juanetta Nordmann M.D.   On: 01/13/2024 23:02   US  EKG SITE RITE Result Date: 01/12/2024 If Site Rite image not attached, placement could not be confirmed due to current cardiac rhythm.  ECHOCARDIOGRAM COMPLETE Result Date: 01/09/2024    ECHOCARDIOGRAM REPORT   Patient Name:   Brandon [name] Date of Exam: 01/09/2024 Medical Rec #:  [id number]        Height:       72.0 in Accession #:    [id number]       Weight:       142.0 lb Date of Birth:  05/18/1966        BSA:  1.842 m Patient Age:    57 years         BP:           151/96 mmHg Patient Gender: M                HR:           92 bpm. Exam Location:  Inpatient Procedure: 2D Echo, Cardiac Doppler and Color Doppler (Both Spectral and Color            Flow Doppler were utilized during procedure). Indications:    Bacteremia  History:        Patient has prior history of Echocardiogram examinations, most                 recent  02/07/2020. CAD, Stroke; Risk Factors:Hypertension,                 Dyslipidemia and Diabetes. SVT.  Sonographer:    Juanita Shaw Referring Phys: [id number] Tylene Galla M GHIMIRE IMPRESSIONS  1. Left ventricular ejection fraction, by estimation, is 65 to 70%. The left ventricle has normal function. There is mild left ventricular hypertrophy. Left ventricular diastolic parameters were normal.  2. Right ventricular systolic function is normal. The right ventricular size is normal.  3. The mitral valve is normal in structure. No evidence of mitral valve regurgitation.  4. AV is thickened, moderately calcified with no significant stenosis.. The aortic valve is tricuspid. Aortic valve regurgitation is not visualized. Aortic valve sclerosis/calcification is present, without any evidence of aortic stenosis. Comparison(s): The left ventricular function is unchanged. FINDINGS  Left Ventricle: Left ventricular ejection fraction, by estimation, is 65 to 70%. The left ventricle has normal function. The left ventricular internal cavity size was normal in size. There is mild left ventricular hypertrophy. Left ventricular diastolic  parameters were normal. Right Ventricle: The right ventricular size is normal. Right vetricular wall thickness was not assessed. Right ventricular systolic function is normal. Left Atrium: Left atrial size was normal in size. Right Atrium: Right atrial size was normal in size. Pericardium: There is no evidence of pericardial effusion. Mitral Valve: The mitral valve is normal in structure. No evidence of mitral valve regurgitation. MV peak gradient, 10.0 mmHg. The mean mitral valve gradient is 3.0 mmHg. Aortic Valve: AV is thickened, moderately calcified with no significant stenosis. The aortic valve is tricuspid. Aortic valve regurgitation is not visualized. Aortic valve sclerosis/calcification is present, without any evidence of aortic stenosis. Aortic valve mean gradient measures 4.0 mmHg. Aortic valve peak  gradient measures 8.5 mmHg. Aortic valve area, by VTI measures 1.70 cm. Pulmonic Valve: The pulmonic valve was not well visualized. Pulmonic valve regurgitation is not visualized. Aorta: The aortic root and ascending aorta are structurally normal, with no evidence of dilitation. IAS/Shunts: No atrial level shunt detected by color flow Doppler.  LEFT VENTRICLE PLAX 2D LVIDd:         3.60 cm      Diastology LVIDs:         2.30 cm      LV e' medial:    6.31 cm/s LV PW:         0.90 cm      LV E/e' medial:  9.3 LV IVS:        1.20 cm      LV e' lateral:   6.20 cm/s LVOT diam:     2.00 cm      LV E/e' lateral: 9.4 LV SV:  35 LV SV Index:   19 LVOT Area:     3.14 cm  LV Volumes (MOD) LV vol d, MOD A2C: 101.0 ml LV vol d, MOD A4C: 115.0 ml LV vol s, MOD A2C: 30.3 ml LV vol s, MOD A4C: 45.7 ml LV SV MOD A2C:     70.7 ml LV SV MOD A4C:     115.0 ml LV SV MOD BP:      74.2 ml RIGHT VENTRICLE RV Basal diam:  2.80 cm RV Mid diam:    1.60 cm RV S prime:     21.40 cm/s TAPSE (M-mode): 2.0 cm LEFT ATRIUM             Index        RIGHT ATRIUM          Index LA diam:        4.20 cm 2.28 cm/m   RA Area:     5.28 cm LA Vol (A2C):   22.6 ml 12.27 ml/m  RA Volume:   5.54 ml  3.01 ml/m LA Vol (A4C):   23.0 ml 12.49 ml/m LA Biplane Vol: 23.2 ml 12.60 ml/m  AORTIC VALVE                    PULMONIC VALVE AV Area (Vmax):    1.57 cm     PV Vmax:       0.87 m/s AV Area (Vmean):   1.62 cm     PV Peak grad:  3.0 mmHg AV Area (VTI):     1.70 cm AV Vmax:           146.00 cm/s AV Vmean:          83.500 cm/s AV VTI:            0.209 m AV Peak Grad:      8.5 mmHg AV Mean Grad:      4.0 mmHg LVOT Vmax:         72.90 cm/s LVOT Vmean:        43.000 cm/s LVOT VTI:          0.113 m LVOT/AV VTI ratio: 0.54  AORTA Ao Root diam: 3.30 cm Ao Asc diam:  3.00 cm MITRAL VALVE MV Area (PHT): 2.82 cm    SHUNTS MV Area VTI:   1.27 cm    Systemic VTI:  0.11 m MV Peak grad:  10.0 mmHg   Systemic Diam: 2.00 cm MV Mean grad:  3.0 mmHg MV Vmax:        1.58 m/s MV Vmean:      77.8 cm/s MV Decel Time: 269 msec MV E velocity: 58.40 cm/s MV A velocity: 80.20 cm/s MV E/A ratio:  0.73 Ola Berger MD Electronically signed by Ola Berger MD Signature Date/Time: 01/09/2024/12:08:21 PM    Final    MR THORACIC SPINE W WO CONTRAST Result Date: 01/07/2024 CLINICAL DATA:  Possible spinal abscess on CT EXAM: MRI CERVICAL, THORACIC AND LUMBAR SPINE WITHOUT AND WITH CONTRAST TECHNIQUE: Multiplanar and multiecho pulse sequences of the cervical spine, to include the craniocervical junction and cervicothoracic junction, and thoracic and lumbar spine, were obtained without and with intravenous contrast. CONTRAST:  7mL GADAVIST  GADOBUTROL  1 MMOL/ML IV SOLN COMPARISON:  Abdominal CT from yesterday.  Thoracic MRI 02/06/2020 FINDINGS: MRI CERVICAL SPINE FINDINGS Alignment: No acute malalignment Vertebrae: Endplate edema at B1-4 and C3-4 is likely degenerative. No evidence of endplate destruction or paravertebral edema. No acute fracture Cord: Not well  assessed given the degree of motion artifact, canal exclude cord signal abnormality. Posterior Fossa, vertebral arteries, paraspinal tissues: Absent flow void with collapse in the right ICA. The distal right vertebral flow void is also not seen, both findings known from 2023 brain MRI. Disc levels: C2-3: Facet spurring and disc space narrowing without impingement C3-4: Disc collapse with endplate and uncovertebral ridging. There is a broad central protrusion. Spinal stenosis with cord flattening, midline canal diameter of 5 mm. Bilateral foraminal narrowing is mild by postcontrast axial images. C4-5: Disc collapse and endplate degeneration with circumferential disc bulging and ridging. Spinal stenosis with cord flattening. Advanced right and moderate left foraminal narrowing by postcontrast axial images. C5-6: Disc narrowing and bulging with uncovertebral spurring. Degenerative facet spurring greater on the right. Mild spinal stenosis.  Moderate bilateral foraminal narrowing by axial postcontrast images. C6-7: Disc space narrowing and mild spurring. No evidence of impingement C7-T1:Mild facet spurring and disc bulging. No evidence of impingement MRI THORACIC SPINE FINDINGS Alignment:  No acute malalignment.  Generalized thoracic kyphosis. Vertebrae: T9-10 and T10-11 intervertebral ankylosis, site of prior discitis. Remote compression fracture of T12. No acute fracture or endplate destruction Cord: Normal cord signal. Posterior and right eccentric epidural collection with rim enhancement starting it T10-11 with epidural thickening starting at T7-8. The cord is displaced leftward and ventrally at T10 and below without detected cord edema. Paraspinal and other soft tissues: No paraspinal phlegmon or mass seen. Disc levels: Generalized disc desiccation and narrowing. Mild multilevel facet spurring. Small disc herniations at T4-5 and T8-9, insignificant. MRI LUMBAR SPINE FINDINGS Segmentation:  Standard. Alignment:  Mild L4-5 and L5-S1 retrolisthesis Vertebrae: T2 hyperintensity with gas phenomenon in the L4-5 and L5-S1 disc spaces, favor degenerative in the absence of significant endplate edema or endplate destruction. No acute fracture. Remote L1 compression fracture. Mild degenerative marrow edema at the L4-5 facets. Conus medullaris and cauda equina: Posterior epidural collection beginning of thoracic spine becomes more ventral predominant at L3 and below, ventral collection measuring up to 7 mm in thickness on sagittal images with bubbles of gas. Although there is still continuing epidural thickening and hyperenhancement, no collection seen below L5-S1 Paraspinal and other soft tissues: No perispinal abscess detected. See prior abdominal CT Disc levels: Generalized degenerative disc narrowing and bulging with degenerative facet spurring at L2-3 and below. Degeneration contributes to bilateral foraminal stenosis at L3-4 and below spinal stenosis  especially at L4-5. IMPRESSION: Cervical spine: No acute finding.  No evidence of discitis or abscess. Very motion degraded MRI still positive for moderate degenerative spinal stenosis at C3-4 and C4-5. Foraminal stenoses at C3-4 to C5-6. Absent right carotid and vertebral flow voids, chronic when compared to 2023. Thoracic and lumbar spine: Epidural abscess spanning T10-11 to L5-S1, primarily posterior canal in the upper extent and in the anterior canal at the lower extent. At the level of L3, ventral abscess measures up to 7 mm in thickness and contains gas. There is some disc edematous signal at L4-5 and L5-S1, but no clear discitis, suspect direct paravertebral seeding from UTI based on prior abdominal CT. The lower cord and cauda equina is diffusely compressed by the collection. Electronically Signed   By: Ronnette Coke M.D.   On: 01/07/2024 05:08   MR Lumbar Spine W Wo Contrast Result Date: 01/07/2024 CLINICAL DATA:  Possible spinal abscess on CT EXAM: MRI CERVICAL, THORACIC AND LUMBAR SPINE WITHOUT AND WITH CONTRAST TECHNIQUE: Multiplanar and multiecho pulse sequences of the cervical spine, to include the craniocervical junction  and cervicothoracic junction, and thoracic and lumbar spine, were obtained without and with intravenous contrast. CONTRAST:  7mL GADAVIST  GADOBUTROL  1 MMOL/ML IV SOLN COMPARISON:  Abdominal CT from yesterday.  Thoracic MRI 02/06/2020 FINDINGS: MRI CERVICAL SPINE FINDINGS Alignment: No acute malalignment Vertebrae: Endplate edema at Y8-6 and C3-4 is likely degenerative. No evidence of endplate destruction or paravertebral edema. No acute fracture Cord: Not well assessed given the degree of motion artifact, canal exclude cord signal abnormality. Posterior Fossa, vertebral arteries, paraspinal tissues: Absent flow void with collapse in the right ICA. The distal right vertebral flow void is also not seen, both findings known from 2023 brain MRI. Disc levels: C2-3: Facet spurring and  disc space narrowing without impingement C3-4: Disc collapse with endplate and uncovertebral ridging. There is a broad central protrusion. Spinal stenosis with cord flattening, midline canal diameter of 5 mm. Bilateral foraminal narrowing is mild by postcontrast axial images. C4-5: Disc collapse and endplate degeneration with circumferential disc bulging and ridging. Spinal stenosis with cord flattening. Advanced right and moderate left foraminal narrowing by postcontrast axial images. C5-6: Disc narrowing and bulging with uncovertebral spurring. Degenerative facet spurring greater on the right. Mild spinal stenosis. Moderate bilateral foraminal narrowing by axial postcontrast images. C6-7: Disc space narrowing and mild spurring. No evidence of impingement C7-T1:Mild facet spurring and disc bulging. No evidence of impingement MRI THORACIC SPINE FINDINGS Alignment:  No acute malalignment.  Generalized thoracic kyphosis. Vertebrae: T9-10 and T10-11 intervertebral ankylosis, site of prior discitis. Remote compression fracture of T12. No acute fracture or endplate destruction Cord: Normal cord signal. Posterior and right eccentric epidural collection with rim enhancement starting it T10-11 with epidural thickening starting at T7-8. The cord is displaced leftward and ventrally at T10 and below without detected cord edema. Paraspinal and other soft tissues: No paraspinal phlegmon or mass seen. Disc levels: Generalized disc desiccation and narrowing. Mild multilevel facet spurring. Small disc herniations at T4-5 and T8-9, insignificant. MRI LUMBAR SPINE FINDINGS Segmentation:  Standard. Alignment:  Mild L4-5 and L5-S1 retrolisthesis Vertebrae: T2 hyperintensity with gas phenomenon in the L4-5 and L5-S1 disc spaces, favor degenerative in the absence of significant endplate edema or endplate destruction. No acute fracture. Remote L1 compression fracture. Mild degenerative marrow edema at the L4-5 facets. Conus medullaris and  cauda equina: Posterior epidural collection beginning of thoracic spine becomes more ventral predominant at L3 and below, ventral collection measuring up to 7 mm in thickness on sagittal images with bubbles of gas. Although there is still continuing epidural thickening and hyperenhancement, no collection seen below L5-S1 Paraspinal and other soft tissues: No perispinal abscess detected. See prior abdominal CT Disc levels: Generalized degenerative disc narrowing and bulging with degenerative facet spurring at L2-3 and below. Degeneration contributes to bilateral foraminal stenosis at L3-4 and below spinal stenosis especially at L4-5. IMPRESSION: Cervical spine: No acute finding.  No evidence of discitis or abscess. Very motion degraded MRI still positive for moderate degenerative spinal stenosis at C3-4 and C4-5. Foraminal stenoses at C3-4 to C5-6. Absent right carotid and vertebral flow voids, chronic when compared to 2023. Thoracic and lumbar spine: Epidural abscess spanning T10-11 to L5-S1, primarily posterior canal in the upper extent and in the anterior canal at the lower extent. At the level of L3, ventral abscess measures up to 7 mm in thickness and contains gas. There is some disc edematous signal at L4-5 and L5-S1, but no clear discitis, suspect direct paravertebral seeding from UTI based on prior abdominal CT. The lower cord and  cauda equina is diffusely compressed by the collection. Electronically Signed   By: Ronnette Coke M.D.   On: 01/07/2024 05:08   MR CERVICAL SPINE W WO CONTRAST Result Date: 01/07/2024 CLINICAL DATA:  Possible spinal abscess on CT EXAM: MRI CERVICAL, THORACIC AND LUMBAR SPINE WITHOUT AND WITH CONTRAST TECHNIQUE: Multiplanar and multiecho pulse sequences of the cervical spine, to include the craniocervical junction and cervicothoracic junction, and thoracic and lumbar spine, were obtained without and with intravenous contrast. CONTRAST:  7mL GADAVIST  GADOBUTROL  1 MMOL/ML IV SOLN  COMPARISON:  Abdominal CT from yesterday.  Thoracic MRI 02/06/2020 FINDINGS: MRI CERVICAL SPINE FINDINGS Alignment: No acute malalignment Vertebrae: Endplate edema at W0-9 and C3-4 is likely degenerative. No evidence of endplate destruction or paravertebral edema. No acute fracture Cord: Not well assessed given the degree of motion artifact, canal exclude cord signal abnormality. Posterior Fossa, vertebral arteries, paraspinal tissues: Absent flow void with collapse in the right ICA. The distal right vertebral flow void is also not seen, both findings known from 2023 brain MRI. Disc levels: C2-3: Facet spurring and disc space narrowing without impingement C3-4: Disc collapse with endplate and uncovertebral ridging. There is a broad central protrusion. Spinal stenosis with cord flattening, midline canal diameter of 5 mm. Bilateral foraminal narrowing is mild by postcontrast axial images. C4-5: Disc collapse and endplate degeneration with circumferential disc bulging and ridging. Spinal stenosis with cord flattening. Advanced right and moderate left foraminal narrowing by postcontrast axial images. C5-6: Disc narrowing and bulging with uncovertebral spurring. Degenerative facet spurring greater on the right. Mild spinal stenosis. Moderate bilateral foraminal narrowing by axial postcontrast images. C6-7: Disc space narrowing and mild spurring. No evidence of impingement C7-T1:Mild facet spurring and disc bulging. No evidence of impingement MRI THORACIC SPINE FINDINGS Alignment:  No acute malalignment.  Generalized thoracic kyphosis. Vertebrae: T9-10 and T10-11 intervertebral ankylosis, site of prior discitis. Remote compression fracture of T12. No acute fracture or endplate destruction Cord: Normal cord signal. Posterior and right eccentric epidural collection with rim enhancement starting it T10-11 with epidural thickening starting at T7-8. The cord is displaced leftward and ventrally at T10 and below without detected  cord edema. Paraspinal and other soft tissues: No paraspinal phlegmon or mass seen. Disc levels: Generalized disc desiccation and narrowing. Mild multilevel facet spurring. Small disc herniations at T4-5 and T8-9, insignificant. MRI LUMBAR SPINE FINDINGS Segmentation:  Standard. Alignment:  Mild L4-5 and L5-S1 retrolisthesis Vertebrae: T2 hyperintensity with gas phenomenon in the L4-5 and L5-S1 disc spaces, favor degenerative in the absence of significant endplate edema or endplate destruction. No acute fracture. Remote L1 compression fracture. Mild degenerative marrow edema at the L4-5 facets. Conus medullaris and cauda equina: Posterior epidural collection beginning of thoracic spine becomes more ventral predominant at L3 and below, ventral collection measuring up to 7 mm in thickness on sagittal images with bubbles of gas. Although there is still continuing epidural thickening and hyperenhancement, no collection seen below L5-S1 Paraspinal and other soft tissues: No perispinal abscess detected. See prior abdominal CT Disc levels: Generalized degenerative disc narrowing and bulging with degenerative facet spurring at L2-3 and below. Degeneration contributes to bilateral foraminal stenosis at L3-4 and below spinal stenosis especially at L4-5. IMPRESSION: Cervical spine: No acute finding.  No evidence of discitis or abscess. Very motion degraded MRI still positive for moderate degenerative spinal stenosis at C3-4 and C4-5. Foraminal stenoses at C3-4 to C5-6. Absent right carotid and vertebral flow voids, chronic when compared to 2023. Thoracic and lumbar spine: Epidural  abscess spanning T10-11 to L5-S1, primarily posterior canal in the upper extent and in the anterior canal at the lower extent. At the level of L3, ventral abscess measures up to 7 mm in thickness and contains gas. There is some disc edematous signal at L4-5 and L5-S1, but no clear discitis, suspect direct paravertebral seeding from UTI based on prior  abdominal CT. The lower cord and cauda equina is diffusely compressed by the collection. Electronically Signed   By: Ronnette Coke M.D.   On: 01/07/2024 05:08   CT ABDOMEN PELVIS W CONTRAST Result Date: 01/06/2024 CLINICAL DATA:  Urinary tract infection, recent change in antibiotics. Back pain/flank pain. EXAM: CT ABDOMEN AND PELVIS WITH CONTRAST TECHNIQUE: Multidetector CT imaging of the abdomen and pelvis was performed using the standard protocol following bolus administration of intravenous contrast. RADIATION DOSE REDUCTION: This exam was performed according to the departmental dose-optimization program which includes automated exposure control, adjustment of the mA and/or kV according to patient size and/or use of iterative reconstruction technique. CONTRAST:  OMNIPAQUE  IOHEXOL  300 MG/ML  SOLN COMPARISON:  Lumbar radiographs 12/31/2023 and CT abdomen 02/06/2020 FINDINGS: The patient is contracted and rotated on today's exam with some resulting reduced diagnostic accuracy. Lower chest: Coronary and descending thoracic aortic atherosclerosis. Distal esophageal wall thickening is nonspecific although esophagitis would be a common cause. Airway plugging in the right lower lobe with bandlike irregular right lower lobe opacity likely reflecting mild localized pneumonia. Lesser degree of airway plugging in the left lower lobe. Trace left pleural thickening posteriorly on image 16 series 2, nonspecific. Hepatobiliary: Intrahepatic biliary dilatation similar to previous without definite substantial extrahepatic biliary dilatation. Cavernous transformation of the portal vein. No definite focal liver mass identified. Pancreas: Heterogeneous density in the pancreas diffusely, pancreatitis is not excluded. Scattered calcifications likely reflect a chronic component of pancreatitis. Correlate with lipase levels. No obvious abscess or pseudocyst. Spleen: Unremarkable Adrenals/Urinary Tract: Fullness of the adrenal  glands without discrete mass. Relative left renal atrophy. Indistinctly marginated parenchymal hypodensity in the right mid kidney on image 101 series 8 and also shown on image 32 series 2, suspicious for pyelonephritis. No hydronephrosis or hydroureter. 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 No well-defined drainable abscess. No free air in the peritoneal cavity identified. Stomach/Bowel: Prominent stool throughout the colon favors constipation. Normal appendix. No imaging findings of pseudomembranous colitis. 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 No out right complete occlusion. Celiac trunk appears patent. Aortic and proximal renal artery atheromatous vascular calcifications. Reproductive: Unremarkable Other: No supplemental non-categorized findings. Musculoskeletal: Prior interbody fusion at T9-T10-T11. Scattered abnormal gas in the spinal canal extending from the T12 level through the L5 level, some peripheral and some central in distribution in the canal. Cannot exclude intradural gas or regional spinal canal abscess, although I do not see new definite bony destructive findings along the endplates to further indicate recurrent discitis. Lumbar MRI with and without contrast is recommended for further characterization. IMPRESSION: 1. 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 No well-defined drainable pelvic  abscess. 2. Indistinctly marginated parenchymal  hypodensity in the right mid kidney, suspicious for pyelonephritis. 3. Scattered abnormal gas in the spinal canal extending from the T12 level through the L5 level, some peripheral and some central in distribution in the canal. Cannot exclude intradural gas or regional spinal canal abscess, although I do not see new definite bony destructive findings along the endplates to further indicate recurrent discitis. Lumbar MRI with and without contrast is recommended for further characterization, specifically to assess for spinal infection. 4. Heterogeneous density in the pancreas diffusely, pancreatitis is not excluded. Scattered calcifications likely reflect a chronic component of pancreatitis. Correlate with lipase levels. 5. Airway plugging in the right lower lobe with bandlike irregular right lower lobe opacity likely reflecting mild localized pneumonia. Lesser degree of airway plugging in the left lower lobe. 6. 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 7. Prominent stool throughout the colon favors constipation. 8. Coronary and descending thoracic aortic atherosclerosis. Aortic Atherosclerosis (ICD10-I70.0). 9. Distal esophageal wall thickening is nonspecific although esophagitis would be a common cause. Electronically Signed   By: Freida Jes M.D.   On: 01/06/2024 17:04   DG Lumbar Spine 2-3 Views Result Date: 01/01/2024 CLINICAL DATA:  Initial evaluation for acute pain. EXAM: LUMBAR SPINE - 2-3 VIEW COMPARISON:  None Available. FINDINGS: Straightening of the normal lumbar lordosis. No significant listhesis. Transitional features about the lumbosacral junction with a partially sacralized L5 vertebral body Mild chronic wedging deformity of the T12 vertebral body noted. Endplate Schmorl's node deformity with mild height loss present at the inferior endplate of T11. No acute fracture. Visualized sacrum and pelvis intact. Underlying osteopenia. Lower lumbar  degenerative disc disease at L3-4 and L4-5. Lower lumbar facet hypertrophy. Osteopenia noted. Visualized soft tissues demonstrate no acute finding. IMPRESSION: No radiographic evidence for acute abnormality within the lumbar spine. Electronically Signed   By: Virgia Griffins M.D.   On: 01/01/2024 03:05    Micro Results    Recent Results (from the past 240 hours)  MRSA Next Gen by PCR, Nasal     Status: Abnormal   Collection Time: 01/19/24  8:55 PM   Specimen: Nasal Mucosa; Nasal Swab  Result Value Ref Range Status   MRSA by PCR Next Gen DETECTED (A) NOT DETECTED Final    Comment: RESULT CALLED TO, READ BACK BY AND VERIFIED WITH: R CAMOLISGA,RN@[id number]  MK (NOTE) The GeneXpert MRSA Assay (FDA approved for NASAL specimens only), is one component of a comprehensive MRSA colonization surveillance program. It is not intended to diagnose MRSA infection nor to guide or monitor treatment for MRSA infections. Test performance is not FDA approved in patients less than 54 years old. Performed at [facility], [address], [place], [place] [address]     Today   Subjective    Christion Strabala with no significant events overnight, he himself denies any complaints today,   Objective   Blood pressure 111/76, pulse 100, temperature 97.6 F (36.4 C), temperature source Oral, resp. rate 15, height 6' (1.829 m), weight 64.4 kg, SpO2 97%.   Intake/Output Summary (Last 24 hours) at 01/21/2024 1019 Last data filed at 01/21/2024 0516 Gross per 24 hour  Intake 363 ml  Output 800 ml  Net -437 ml    Exam  Frail and cachectic, middle-aged white male, awake Alert, No new F.N deficits, chronic left-sided hemiparesis from previous stroke, condom catheter in place, right arm PICC line,  [email or web address],PERRAL Supple Neck,   Symmetrical Chest wall movement,  Good air movement bilaterally, CTAB RRR,No Gallops,   +ve B.Sounds, Abd Soft, Non tender,  No Cyanosis, Clubbing or edema    Data Review    Recent Labs  Lab 01/15/24 0438 01/19/24 0503 01/21/24 0355  WBC 13.4* 13.1* 8.6  HGB 9.4* 8.9* 8.0*  HCT 29.6* 28.4* 25.5*  PLT 638* 594* 429*  MCV 94.6 94.7 95.5  MCH 30.0 29.7 30.0  MCHC 31.8 31.3 31.4  RDW 15.6* 15.5 15.5  LYMPHSABS 1.3 1.1 1.3  MONOABS 0.8 1.0 0.7  EOSABS 0.0 0.0 0.1  BASOSABS 0.0 0.0 0.0    Recent Labs  Lab 01/15/24 0438 01/19/24 0503 01/20/24 0407 01/21/24 0355  NA 132* 130* 132* 131*  K 3.8 4.0 3.7 3.8  CL 97* 96* 98 97*  CO2 25 26 27 26   ANIONGAP 10 8 7 8   GLUCOSE 93 131* 184* 189*  BUN 7 <5* 5* 6  CREATININE 0.40* 0.41* 0.46* 0.45*  AST 61* 34  --  39  ALT 61* 38  --  31  ALKPHOS 230* 203*  --  154*  BILITOT 0.6 0.6  --  0.8  ALBUMIN <1.5* 1.6*  --  1.9*  PROCALCITON <0.10  --   --   --   TSH  --   --  1.511  --   MG 1.6* 1.4* 2.0 1.4*  PHOS  --  2.7  --  2.1*  CALCIUM  8.6* 8.5* 8.0* 8.3*    This is a no charge note for today, as discharge summary and instruction have been performed yesterday.  Signature  -    Seena Dadds M.D on 01/21/2024 at 10:19 AM   -  To page go to [email or web address]

## 2024-01-21 NOTE — Plan of Care (Signed)
  Problem: Skin Integrity: Goal: Risk for impaired skin integrity will decrease Outcome: Progressing   Problem: Health Behavior/Discharge Planning: Goal: Ability to manage health-related needs will improve Outcome: Progressing   Problem: Clinical Measurements: Goal: Will remain free from infection Outcome: Progressing   Problem: Coping: Goal: Level of anxiety will decrease Outcome: Progressing

## 2024-02-08 ENCOUNTER — Emergency Department (HOSPITAL_COMMUNITY)
Admission: EM | Admit: 2024-02-08 | Discharge: 2024-02-08 | Disposition: A | Discharge status: Home / Self Care | Attending: Emergency Medicine | Admitting: Emergency Medicine

## 2024-02-08 ENCOUNTER — Encounter (HOSPITAL_COMMUNITY): Payer: Self-pay

## 2024-02-08 ENCOUNTER — Other Ambulatory Visit: Payer: Self-pay

## 2024-02-08 ENCOUNTER — Emergency Department (HOSPITAL_COMMUNITY)

## 2024-02-08 DIAGNOSIS — E119 Type 2 diabetes mellitus without complications: Secondary | ICD-10-CM | POA: Insufficient documentation

## 2024-02-08 DIAGNOSIS — S0003XA Contusion of scalp, initial encounter: Secondary | ICD-10-CM | POA: Diagnosis not present

## 2024-02-08 DIAGNOSIS — W06XXXA Fall from bed, initial encounter: Secondary | ICD-10-CM | POA: Diagnosis not present

## 2024-02-08 DIAGNOSIS — M545 Low back pain, unspecified: Secondary | ICD-10-CM | POA: Diagnosis not present

## 2024-02-08 DIAGNOSIS — Z8673 Personal history of transient ischemic attack (TIA), and cerebral infarction without residual deficits: Secondary | ICD-10-CM | POA: Insufficient documentation

## 2024-02-08 DIAGNOSIS — Z7901 Long term (current) use of anticoagulants: Secondary | ICD-10-CM | POA: Diagnosis not present

## 2024-02-08 DIAGNOSIS — I10 Essential (primary) hypertension: Secondary | ICD-10-CM | POA: Insufficient documentation

## 2024-02-08 DIAGNOSIS — I2510 Atherosclerotic heart disease of native coronary artery without angina pectoris: Secondary | ICD-10-CM | POA: Insufficient documentation

## 2024-02-08 DIAGNOSIS — W19XXXA Unspecified fall, initial encounter: Secondary | ICD-10-CM

## 2024-02-08 DIAGNOSIS — Z794 Long term (current) use of insulin: Secondary | ICD-10-CM | POA: Insufficient documentation

## 2024-02-08 DIAGNOSIS — S0990XA Unspecified injury of head, initial encounter: Secondary | ICD-10-CM | POA: Diagnosis present

## 2024-02-08 LAB — CBG MONITORING, ED: Glucose-Capillary: 196 mg/dL — ABNORMAL HIGH (ref 70–99)

## 2024-02-08 MED ORDER — OXYCODONE-ACETAMINOPHEN 5-325 MG PO TABS
1.0000 | ORAL_TABLET | Freq: Once | ORAL | Status: AC
  Administered 2024-02-08: 1 via ORAL
  Filled 2024-02-08: qty 1

## 2024-02-08 NOTE — ED Provider Notes (Signed)
 [place] EMERGENCY DEPARTMENT AT [facility] Provider Note   CSN: [id number] Arrival date & time: 02/08/24  1016     History  Chief Complaint  Patient presents with   Len Azeez is a 58 y.o. male.  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  Patient reports he rolled over 1 too many times and fell out on the floor hitting the right side of his head on the floor.  There was no reported loss of consciousness by the patient and by Sheryle Donning the nurse who called from [facility] where the patient lives.  She reports the patient has been at his baseline and there has been no concerning behavior recently.  Patient's only complaint is of low back pain which he reports has been ongoing since he got to the facility after hospital discharge.  He reports some mild tenderness in his head.  He denies any other pain.  He does take Plavix .  The history is provided by the patient, the EMS personnel and the nursing home.       Home Medications Prior to Admission medications   Medication Sig Start Date End Date Taking? Authorizing Provider  acetaminophen  (TYLENOL ) 325 MG tablet Take 650 mg by mouth every 6 (six) hours as needed for mild pain (pain score 1-3) (cannot exceed a total of 3,000 mg/24 hours).    [provider]  ascorbic acid (VITAMIN C) 500 MG tablet Take 500 mg by mouth daily. 11/26/21   [provider]  busPIRone  (BUSPAR ) 10 MG tablet Take 1 tablet (10 mg total) by mouth 2 (two) times daily. 12/23/20   Gherghe, Costin M, MD  clopidogrel  (PLAVIX ) 75 MG tablet  Take 1 tablet (75 mg total) by mouth daily. 08/14/23   Cathey Clunes, MD  famotidine  (PEPCID ) 20 MG tablet Take 1 tablet (20 mg total) by mouth 2 (two) times daily. 12/23/20   Gherghe, Costin M, MD  feeding supplement, GLUCERNA SHAKE, (GLUCERNA SHAKE) LIQD Take 1 Container by mouth in the morning, at noon, and at bedtime.    [provider]  FEROSUL 325 (65 Fe) MG tablet Take 1 tablet (325 mg total) by mouth daily. 12/23/20   Gherghe, Costin M, MD  insulin  lispro (HUMALOG KWIKPEN) 100 UNIT/ML KwikPen Inject 0-10 Units into the skin in the morning, at noon, and at bedtime. BS 70-130 inject 0 units  BS 131-180 inject 2 units  BS 181-240 inject 4 units  BS 241-300 inject 6 units  BS 301-350 inject 8 units  BS 351-400 inject 10 units    [provider]  levETIRAcetam  (KEPPRA ) 500 MG tablet Take 1 tablet (500 mg total) by mouth 2 (two) times daily for 2 days. 08/04/23 01/07/24  Cathey Clunes, MD  lipase/protease/amylase (CREON ) 36000 UNITS CPEP capsule Take 1 capsule (36,000 Units total) by mouth 3 (three) times daily with meals. 08/01/23   [name], [place], MD  meropenem  (MERREM ) IVPB Inject 1 g into the vein every 8 (eight) hours. Indication:  Discitis/epidural abscess First Dose: Yes Last Day of Therapy:  03/04/24 Labs - Once weekly:  CBC/D and BMP,  ESR and CRP Method of administration: Mini-Bag Plus / Gravity Method of administration may be changed at the discretion of home infusion pharmacist based upon assessment of the patient and/or caregiver's ability to self-administer the medication ordered. 01/20/24 03/15/24  Cala Castleman, MD  midodrine  (PROAMATINE ) 10 MG tablet Take 1 tablet (10 mg total) by mouth 3 (three) times daily with meals. Please hold for systolic blood pressure more than 110. 01/21/24   Elgergawy, Ardia Kraft, MD  Multiple Vitamin (MULTIVITAMIN WITH MINERALS) TABS tablet Take 1 tablet by mouth daily. 12/23/20   Gherghe, Costin M, MD  mupirocin   ointment (BACTROBAN ) 2 % Apply topically 2 (two) times daily. Patient taking differently: Apply 1 Application topically 2 (two) times daily. Apply to face and ear 08/01/23   Isabell Manzanilla, [place], MD  ondansetron  (ZOFRAN ) 4 MG tablet Take 4 mg by mouth every 6 (six) hours as needed for nausea or vomiting.    [provider]  oxyCODONE  (OXY IR/ROXICODONE ) 5 MG immediate release tablet Take 1 tablet (5 mg total) by mouth every 6 (six) hours as needed for moderate pain (pain score 4-6) or severe pain (pain score 7-10). 01/20/24   Singh, Prashant K, MD  polyethylene glycol (MIRALAX  / GLYCOLAX ) 17 g packet Take 17 g by mouth daily as needed. 01/20/24   Cala Castleman, MD  rosuvastatin  (CRESTOR ) 5 MG tablet Take 5 mg by mouth at bedtime.    [provider]  senna (SENOKOT) 8.6 MG TABS tablet Take 8.6 mg by mouth in the morning and at bedtime.    [provider]  tamsulosin  (FLOMAX ) 0.4 MG CAPS capsule Take 1 capsule (0.4 mg total) by mouth daily after supper. Patient taking differently: Take 0.4 mg by mouth at bedtime. 08/01/23   [name], [place], MD  traZODone  (DESYREL ) 50 MG tablet Take 1 tablet (50 mg total) by mouth at bedtime. 01/20/24   Singh, Prashant K, MD      Allergies    Patient has no known allergies.    Review of Systems   Review of Systems  Physical Exam Updated Vital Signs BP 114/84   Pulse 93   Temp 97.9 F (36.6 C) (Oral)   SpO2 100%  Physical Exam Vitals and nursing note reviewed.  Constitutional:      General: He is not in acute distress.    Appearance: He is well-developed.     Comments: Chronically ill-appearing, cachectic  HENT:     Head: Normocephalic and atraumatic.   Eyes:     Conjunctiva/sclera: Conjunctivae normal.     Pupils: Pupils are equal, round, and reactive to light.  Cardiovascular:     Rate and Rhythm: Normal rate and regular rhythm.     Heart sounds: No murmur heard. Pulmonary:     Effort: Pulmonary effort  is normal. No respiratory distress.     Breath sounds: Normal breath sounds. No wheezing or rales.  Abdominal:     General: There is no distension.     Palpations: Abdomen is soft.     Tenderness: There is no abdominal tenderness. There is no guarding or rebound.  Musculoskeletal:        General: Normal range of motion.     Cervical back: Normal range of motion and neck supple. No tenderness.     Lumbar back: Tenderness present.       Back:     Comments: Patient with contractures of bilateral lower extremities curled up to his right side in the bed  Skin:  General: Skin is warm and dry.     Coloration: Skin is pale.     Findings: No erythema or rash.  Neurological:     Mental Status: He is alert and oriented to person, place, and time.  Psychiatric:        Behavior: Behavior normal.     ED Results / Procedures / Treatments   Labs (all labs ordered are listed, but only abnormal results are displayed) Labs Reviewed  CBG MONITORING, ED - Abnormal; Notable for the following components:      Result Value   Glucose-Capillary 196 (*)    All other components within normal limits    EKG None  Radiology CT Head Wo Contrast Result Date: 02/08/2024 CLINICAL DATA:  58 year old male status post fall from bed. Right forehead abrasion. Pain. Hypotensive. EXAM: CT HEAD WITHOUT CONTRAST TECHNIQUE: Contiguous axial images were obtained from the base of the skull through the vertex without intravenous contrast. RADIATION DOSE REDUCTION: This exam was performed according to the departmental dose-optimization program which includes automated exposure control, adjustment of the mA and/or kV according to patient size and/or use of iterative reconstruction technique. COMPARISON:  Brain MRI 01/07/2022.  Head CT 07/30/2023. FINDINGS: Imaged while decubitus. Brain: Advanced chronic ventriculomegaly, right greater than left anterior frontal lobe encephalomalacia. Resolved small anterior and inferior  frontal hemorrhagic contusions seen last year. No midline shift, transependymal edema, mass effect, evidence of mass lesion, intracranial hemorrhage or evidence of cortically based acute infarction. Gray-white matter differentiation is within normal limits throughout the brain. Vascular: No suspicious intracranial vascular hyperdensity. Calcified atherosclerosis at the skull base. Skull: Mild motion artifact. No acute osseous abnormality identified. Sinuses/Orbits: Visualized paranasal sinuses and mastoids are stable and well aerated. Other: Mild motion artifact, no discrete scalp soft tissue injury identified. No scalp soft tissue gas. Visible orbits soft tissues appear negative. IMPRESSION: 1. Mild motion artifact. No acute intracranial abnormality or acute traumatic injury identified. 2. Chronic bifrontal encephalomalacia,, ventriculomegaly, Cerebral Atrophy (ICD10-G31.9). Electronically Signed   By: Marlise Simpers M.D.   On: 02/08/2024 13:02    Procedures Procedures    Medications Ordered in ED Medications  oxyCODONE -acetaminophen  (PERCOCET/ROXICET) 5-325 MG per tablet 1 tablet (1 tablet Oral Given 02/08/24 1035)    ED Course/ Medical Decision Making/ A&P                                 Medical Decision Making Amount and/or Complexity of Data Reviewed Radiology: ordered and independent interpretation performed. Decision-making details documented in ED Course.  Risk Prescription drug management.   Patient presenting today after a fall from his bed at [facility] with abrasion to the right side of his head.  Patient is complaining of low back pain however that has been present for weeks after being found to have discitis and epidural abscess which he is currently on meropenem  for.  Patient is otherwise well-appearing with normal vital signs.  Does not appear to have underlying new medical issues.  Facility reported no change of behavior recent fevers or other issues.  Will scan patient's head to  ensure no evidence of bleeding as he is on Plavix  but is mentating normally.  Feel otherwise patient will be able to be discharged back to the facility as long as the CT is normal.  I have independently visualized and interpreted pt's images today. CT neg for bleed.  Radiology reports no acute findings.  Pt stable for  d/c.        Final Clinical Impression(s) / ED Diagnoses Final diagnoses:  Fall, initial encounter  Contusion of scalp, initial encounter    Rx / DC Orders ED Discharge Orders     None         Almond Army, MD 02/08/24 1335

## 2024-02-08 NOTE — ED Triage Notes (Signed)
 Pt BIB [facility] from [facility] after mechanical fall from rolling out of bed. Pt has R side forehead abrasion and R sided shoulder pain with no deformity noted. GCS 15, Aox4. Pt has contractures at baseline. Pt was 80 systolic upon EMS arrival, EMS gave 100 mL NS, 98 systolic was last BP from EMS. All other VSS.

## 2024-02-08 NOTE — ED Notes (Signed)
 PT[facility] called.   Report called to [facility], spoke with Jodelle Mungo.

## 2024-02-08 NOTE — ED Notes (Signed)
 PTAR has been arranged for the patient to return to [facility].  An ETA cannot be provided as there are many holds ahead of the patient.

## 2024-02-08 NOTE — ED Notes (Addendum)
 EMS REPORT    BIB [facility]  Mechanical fall, rolled out of bed.   Abrasion to R forehead   No LOC  No neck or back pain   R sided musculoskeletal pain to shoulder.   80 systolic first contact  Aox4, GCS 15  22 L forearm   100 mL saline   Last BP 98 systolic   Contractured at baseline   Might have a pressure sore on R hip?  NSR on EKG   Hr 95  BGL 279  O2 100 RA RR 18

## 2024-02-08 NOTE — Discharge Instructions (Addendum)
 Scan was negative today for any underlying injury

## 2024-03-08 ENCOUNTER — Telehealth: Payer: Self-pay

## 2024-03-08 ENCOUNTER — Ambulatory Visit: Admitting: Internal Medicine

## 2024-03-08 ENCOUNTER — Other Ambulatory Visit: Payer: Self-pay

## 2024-03-08 ENCOUNTER — Encounter: Payer: Self-pay | Admitting: Internal Medicine

## 2024-03-08 VITALS — BP 106/66 | HR 83 | Resp 16 | Ht 72.0 in | Wt 141.0 lb

## 2024-03-08 DIAGNOSIS — Z1612 Extended spectrum beta lactamase (ESBL) resistance: Secondary | ICD-10-CM | POA: Diagnosis not present

## 2024-03-08 DIAGNOSIS — B961 Klebsiella pneumoniae [K. pneumoniae] as the cause of diseases classified elsewhere: Secondary | ICD-10-CM | POA: Diagnosis not present

## 2024-03-08 DIAGNOSIS — G8220 Paraplegia, unspecified: Secondary | ICD-10-CM | POA: Diagnosis not present

## 2024-03-08 DIAGNOSIS — E43 Unspecified severe protein-calorie malnutrition: Secondary | ICD-10-CM

## 2024-03-08 DIAGNOSIS — R7881 Bacteremia: Secondary | ICD-10-CM | POA: Diagnosis not present

## 2024-03-08 DIAGNOSIS — G061 Intraspinal abscess and granuloma: Secondary | ICD-10-CM | POA: Diagnosis present

## 2024-03-08 DIAGNOSIS — M6248 Contracture of muscle, other site: Secondary | ICD-10-CM

## 2024-03-08 DIAGNOSIS — M4620 Osteomyelitis of vertebra, site unspecified: Secondary | ICD-10-CM

## 2024-03-08 MED ORDER — ACETAMINOPHEN 325 MG PO TABS
650.0000 mg | ORAL_TABLET | Freq: Four times a day (QID) | ORAL | Status: AC | PRN
  Administered 2024-03-08: 650 mg via ORAL

## 2024-03-08 MED ORDER — ACETAMINOPHEN 325 MG PO TABS: 325.0000 mg | ORAL_TABLET | Freq: Four times a day (QID) | ORAL | Status: DC | PRN

## 2024-03-08 NOTE — Telephone Encounter (Signed)
 Spoke to Cheney the SW at [facility] and went over orders and care concerns. She will meet with Director of Nursing and family to establish goals and work on showers, transfers, and Nutrition. Also advised PULL PICC due today at facility. She reassured me this will take place.

## 2024-03-08 NOTE — Progress Notes (Unsigned)
 Patient ID: Brandon [name], male   DOB: 11/11/1965, 58 y.o.   MRN: [id number]  HPI T10-T11 to L5-S1 Epidural Abscess Compressed Cord -  discharged from hospital on 4/30 planned for iv abtx through June 16th Bacteremia with Streptococcus Pneumoniae and ESBL Klebsiella Pneumoniae -  Epidural abscess spanning T10-11 to L5-S1, disc signal at L4-5 and L5-S1. Lower cord/cauda equina is diffusely compressed by collection . No surgery recommended at this time.  NSGY following and recommended repeat MRI which has been ordered  CRP is trending down.  - severe contracure.  Outpatient Encounter Medications as of 03/08/2024  Medication Sig   acetaminophen  (TYLENOL ) 325 MG tablet Take 650 mg by mouth every 6 (six) hours as needed for mild pain (pain score 1-3) (cannot exceed a total of 3,000 mg/24 hours).   ascorbic acid (VITAMIN C) 500 MG tablet Take 500 mg by mouth daily.   busPIRone  (BUSPAR ) 10 MG tablet Take 1 tablet (10 mg total) by mouth 2 (two) times daily.   clopidogrel  (PLAVIX ) 75 MG tablet Take 1 tablet (75 mg total) by mouth daily.   famotidine  (PEPCID ) 20 MG tablet Take 1 tablet (20 mg total) by mouth 2 (two) times daily.   feeding supplement, GLUCERNA SHAKE, (GLUCERNA SHAKE) LIQD Take 1 Container by mouth in the morning, at noon, and at bedtime.   FEROSUL 325 (65 Fe) MG tablet Take 1 tablet (325 mg total) by mouth daily.   insulin  lispro (HUMALOG KWIKPEN) 100 UNIT/ML KwikPen Inject 0-10 Units into the skin in the morning, at noon, and at bedtime. BS 70-130 inject 0 units  BS 131-180 inject 2 units  BS 181-240 inject 4 units  BS 241-300 inject 6 units  BS 301-350 inject 8 units  BS 351-400 inject 10 units   levETIRAcetam  (KEPPRA ) 500 MG tablet Take 1 tablet (500 mg total) by mouth 2 (two) times daily for 2 days.   lipase/protease/amylase (CREON ) 36000 UNITS CPEP capsule Take 1 capsule (36,000 Units total) by mouth 3 (three) times daily with meals.   meropenem  (MERREM ) IVPB Inject 1 g  into the vein every 8 (eight) hours. Indication:  Discitis/epidural abscess First Dose: Yes Last Day of Therapy:  03/04/24 Labs - Once weekly:  CBC/D and BMP, ESR and CRP Method of administration: Mini-Bag Plus / Gravity Method of administration may be changed at the discretion of home infusion pharmacist based upon assessment of the patient and/or caregiver's ability to self-administer the medication ordered.   midodrine  (PROAMATINE ) 10 MG tablet Take 1 tablet (10 mg total) by mouth 3 (three) times daily with meals. Please hold for systolic blood pressure more than 110.   Multiple Vitamin (MULTIVITAMIN WITH MINERALS) TABS tablet Take 1 tablet by mouth daily.   mupirocin  ointment (BACTROBAN ) 2 % Apply topically 2 (two) times daily. (Patient taking differently: Apply 1 Application topically 2 (two) times daily. Apply to face and ear)   ondansetron  (ZOFRAN ) 4 MG tablet Take 4 mg by mouth every 6 (six) hours as needed for nausea or vomiting.   oxyCODONE  (OXY IR/ROXICODONE ) 5 MG immediate release tablet Take 1 tablet (5 mg total) by mouth every 6 (six) hours as needed for moderate pain (pain score 4-6) or severe pain (pain score 7-10).   polyethylene glycol (MIRALAX  / GLYCOLAX ) 17 g packet Take 17 g by mouth daily as needed.   rosuvastatin  (CRESTOR ) 5 MG tablet Take 5 mg by mouth at bedtime.   senna (SENOKOT) 8.6 MG TABS tablet Take 8.6 mg by  mouth in the morning and at bedtime.   tamsulosin  (FLOMAX ) 0.4 MG CAPS capsule Take 1 capsule (0.4 mg total) by mouth daily after supper. (Patient taking differently: Take 0.4 mg by mouth at bedtime.)   traZODone  (DESYREL ) 50 MG tablet Take 1 tablet (50 mg total) by mouth at bedtime.   No facility-administered encounter medications on file as of 03/08/2024.     Patient Active Problem List   Diagnosis Date Noted   Flank pain 01/08/2024   Pneumococcal bacteremia 01/08/2024   Pneumaturia 01/08/2024   Sepsis (HCC) 01/07/2024   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    SVT (supraventricular tachycardia) (HCC) 01/07/2024   Infection due to ESBL-producing Klebsiella pneumoniae 01/07/2024   Gram-negative bacteremia 01/07/2024   Hyperglycemia 01/07/2024   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   Abnormal findings on diagnostic imaging of spine 01/06/2024   Acute pyelonephritis 01/06/2024   Paraplegia (HCC) 01/06/2024   Fall 08/01/2023   ICH (intracerebral hemorrhage) (HCC) 07/30/2023   Pain in left shoulder 03/25/2022   Pressure injury of skin 01/08/2022   Frequent falls 01/08/2022   DKA (diabetic ketoacidosis) (HCC) 01/07/2022   Rhabdomyolysis 01/07/2022   Protein-calorie malnutrition, severe 12/23/2020   Hyperosmolar hyperglycemic state (HHS) (HCC) 12/22/2020   Uncontrolled type 2 diabetes mellitus with hyperglycemia, with long-term current use of insulin  (HCC) 12/21/2020   Hyperglycemia due to diabetes mellitus (HCC) 12/21/2020   Empyema lung (HCC)    Epidural abscess    Diskitis 02/06/2020   Alcohol  withdrawal delirium (HCC) 02/06/2020   Epilepsy (HCC) 02/06/2020   Pleural effusion on right 02/06/2020   History of stroke 01/23/2017   Dry eyes    Sleep disturbance    Essential hypertension, benign    Upper GI bleed    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   Fatty liver    Tobacco abuse    Diabetes mellitus type 2 in nonobese (HCC)    History of CVA with residual deficit    Acute lower UTI    Wide-complex tachycardia    Chronic alcoholic pancreatitis (HCC)    Hyponatremia 03/09/2016   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   Acute blood loss anemia 03/09/2016   Gastric varices    Alcohol  abuse    Left-sided neglect    Severe anemia    UGIB (upper gastrointestinal bleed)    Pressure ulcer 03/06/2016   Acute encephalopathy    Cerebral infarction (HCC) 03/05/2016   Carotid stenosis 04/06/2015   CAD in native artery 03/16/2015   Unstable angina pectoris (HCC) 03/15/2015   Neck pain 10/20/2013    Insomnia 12/29/2012   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 02/25/2011   Hepatic steatosis 02/25/2011     Health Maintenance Due  Topic Date Due   FOOT EXAM  Never done   OPHTHALMOLOGY EXAM  Never done   Diabetic kidney evaluation - Urine ACR  Never done   Pneumococcal Vaccine 53-89 Years old (1 of 2 - PCV) Never done   Lung Cancer Screening  05/06/2014   Zoster Vaccines- Shingrix (1 of 2) Never done   Colonoscopy  12/08/2017   DTaP/Tdap/Td (2 - Td or Tdap) 11/26/2022   COVID-19 Vaccine (1 - 2024-25 season) Never done     Review of Systems  Physical Exam   BP 106/66   Pulse 83   Resp 16   Ht 6' (1.829 m)   BMI 19.26 kg/m    No results found for: CD4TCELL No results found for: CD4TABS No results found for: HIV1RNAQUANT No results found for: HEPBSAB No results found for: RPR, LABRPR  CBC Lab Results  Component Value Date   WBC 8.6 01/21/2024   RBC 2.67 (L) 01/21/2024   HGB 8.0 (L) 01/21/2024   HCT 25.5 (L) 01/21/2024   PLT 429 (H) 01/21/2024   MCV 95.5 01/21/2024   MCH 30.0 01/21/2024   MCHC 31.4 01/21/2024   RDW 15.5 01/21/2024   LYMPHSABS 1.3 01/21/2024   MONOABS 0.7 01/21/2024   EOSABS 0.1 01/21/2024    BMET Lab Results  Component Value Date   NA 131 (L) 01/21/2024   K 3.8 01/21/2024   CL 97 (L) 01/21/2024   CO2 26 01/21/2024   GLUCOSE 189 (H) 01/21/2024   BUN 6 01/21/2024   CREATININE 0.45 (L)  01/21/2024   CALCIUM  8.3 (L) 01/21/2024   GFRNONAA >60 01/21/2024   GFRAA >60 02/19/2020      Assessment and Plan  Pull picc line  Gave tylenol  in office for pain  Will check sed rate and crp- if not improved may need to bridge with FQ   Severe protein calorie malnutrition

## 2024-03-08 NOTE — Patient Instructions (Signed)
 Smoking Cessation: QuitlineNC 1-800-QUIT-NOW 707-701-6721); Espaol: 1-855-Djelo-Ya (1-780-445-4976) http://carroll-castaneda.info/

## 2024-03-08 NOTE — Telephone Encounter (Signed)
 Left VM with clinical nurse and Arts administrator at [facility].  Patient needs 2-3 cans of Ensure due to severe malnutrition-  Patient need rotated in bed every 3 hour to prevent open wound in R Hip Pressure Ulcer-  Needs 3 Meals daily-  Needs shower every other day.  Patient states he is not moved at all in the bed and never transferred to wheelchair. Severe contractions in each extremity.   Left Vm twice and spoke to [facility] front desk but she was not able to get any clinical staff on the phone.  Orders for all this were sent with EMS to Facility.

## 2024-03-09 LAB — CBC WITH DIFFERENTIAL/PLATELET
Absolute Lymphocytes: 1084 {cells}/uL (ref 850–3900)
Absolute Monocytes: 281 {cells}/uL (ref 200–950)
Basophils Absolute: 39 {cells}/uL (ref 0–200)
Basophils Relative: 0.7 %
Eosinophils Absolute: 50 {cells}/uL (ref 15–500)
Eosinophils Relative: 0.9 %
HCT: 33.5 % — ABNORMAL LOW (ref 38.5–50.0)
Hemoglobin: 10.3 g/dL — ABNORMAL LOW (ref 13.2–17.1)
MCH: 28.7 pg (ref 27.0–33.0)
MCHC: 30.7 g/dL — ABNORMAL LOW (ref 32.0–36.0)
MCV: 93.3 fL (ref 80.0–100.0)
MPV: 10.6 fL (ref 7.5–12.5)
Monocytes Relative: 5.1 %
Neutro Abs: 4048 {cells}/uL (ref 1500–7800)
Neutrophils Relative %: 73.6 %
Platelets: 278 10*3/uL (ref 140–400)
RBC: 3.59 10*6/uL — ABNORMAL LOW (ref 4.20–5.80)
RDW: 15.2 % — ABNORMAL HIGH (ref 11.0–15.0)
Total Lymphocyte: 19.7 %
WBC: 5.5 10*3/uL (ref 3.8–10.8)

## 2024-03-09 LAB — BASIC METABOLIC PANEL WITH GFR
BUN/Creatinine Ratio: 23 (calc) — ABNORMAL HIGH (ref 6–22)
BUN: 10 mg/dL (ref 7–25)
CO2: 23 mmol/L (ref 20–32)
Calcium: 8.9 mg/dL (ref 8.6–10.3)
Chloride: 102 mmol/L (ref 98–110)
Creat: 0.43 mg/dL — ABNORMAL LOW (ref 0.70–1.30)
Glucose, Bld: 181 mg/dL — ABNORMAL HIGH (ref 65–99)
Potassium: 4.2 mmol/L (ref 3.5–5.3)
Sodium: 135 mmol/L (ref 135–146)
eGFR: 123 mL/min/{1.73_m2} (ref >=60)

## 2024-03-09 LAB — SEDIMENTATION RATE: Sed Rate: 9 mm/h (ref 0–20)

## 2024-03-09 LAB — C-REACTIVE PROTEIN: CRP: 23.2 mg/L — ABNORMAL HIGH (ref <8.0)

## 2024-03-28 ENCOUNTER — Other Ambulatory Visit: Payer: Self-pay

## 2024-03-28 ENCOUNTER — Inpatient Hospital Stay (HOSPITAL_COMMUNITY)
Admission: EM | Admit: 2024-03-28 | Discharge: 2024-04-01 | DRG: 690 | Disposition: A | Discharge status: Skilled Nursing Facility | Source: Skilled Nursing Facility | Attending: Internal Medicine | Admitting: Internal Medicine

## 2024-03-28 ENCOUNTER — Inpatient Hospital Stay (HOSPITAL_COMMUNITY)

## 2024-03-28 ENCOUNTER — Encounter (HOSPITAL_COMMUNITY): Payer: Self-pay | Admitting: Emergency Medicine

## 2024-03-28 DIAGNOSIS — B961 Klebsiella pneumoniae [K. pneumoniae] as the cause of diseases classified elsewhere: Secondary | ICD-10-CM | POA: Diagnosis present

## 2024-03-28 DIAGNOSIS — I10 Essential (primary) hypertension: Secondary | ICD-10-CM | POA: Diagnosis present

## 2024-03-28 DIAGNOSIS — E7849 Other hyperlipidemia: Secondary | ICD-10-CM

## 2024-03-28 DIAGNOSIS — K8689 Other specified diseases of pancreas: Secondary | ICD-10-CM | POA: Insufficient documentation

## 2024-03-28 DIAGNOSIS — N3001 Acute cystitis with hematuria: Principal | ICD-10-CM | POA: Diagnosis present

## 2024-03-28 DIAGNOSIS — Z955 Presence of coronary angioplasty implant and graft: Secondary | ICD-10-CM

## 2024-03-28 DIAGNOSIS — Z79899 Other long term (current) drug therapy: Secondary | ICD-10-CM

## 2024-03-28 DIAGNOSIS — Z794 Long term (current) use of insulin: Secondary | ICD-10-CM

## 2024-03-28 DIAGNOSIS — K861 Other chronic pancreatitis: Secondary | ICD-10-CM | POA: Diagnosis present

## 2024-03-28 DIAGNOSIS — Z841 Family history of disorders of kidney and ureter: Secondary | ICD-10-CM

## 2024-03-28 DIAGNOSIS — I471 Supraventricular tachycardia, unspecified: Secondary | ICD-10-CM | POA: Diagnosis present

## 2024-03-28 DIAGNOSIS — N400 Enlarged prostate without lower urinary tract symptoms: Secondary | ICD-10-CM | POA: Diagnosis present

## 2024-03-28 DIAGNOSIS — I69351 Hemiplegia and hemiparesis following cerebral infarction affecting right dominant side: Secondary | ICD-10-CM

## 2024-03-28 DIAGNOSIS — E785 Hyperlipidemia, unspecified: Secondary | ICD-10-CM | POA: Diagnosis present

## 2024-03-28 DIAGNOSIS — I2510 Atherosclerotic heart disease of native coronary artery without angina pectoris: Secondary | ICD-10-CM | POA: Diagnosis present

## 2024-03-28 DIAGNOSIS — F32A Depression, unspecified: Secondary | ICD-10-CM | POA: Diagnosis present

## 2024-03-28 DIAGNOSIS — Z8679 Personal history of other diseases of the circulatory system: Secondary | ICD-10-CM

## 2024-03-28 DIAGNOSIS — Z7401 Bed confinement status: Secondary | ICD-10-CM

## 2024-03-28 DIAGNOSIS — Z993 Dependence on wheelchair: Secondary | ICD-10-CM

## 2024-03-28 DIAGNOSIS — Z87891 Personal history of nicotine dependence: Secondary | ICD-10-CM

## 2024-03-28 DIAGNOSIS — Z7902 Long term (current) use of antithrombotics/antiplatelets: Secondary | ICD-10-CM | POA: Diagnosis not present

## 2024-03-28 DIAGNOSIS — D638 Anemia in other chronic diseases classified elsewhere: Secondary | ICD-10-CM | POA: Diagnosis present

## 2024-03-28 DIAGNOSIS — F411 Generalized anxiety disorder: Secondary | ICD-10-CM | POA: Diagnosis present

## 2024-03-28 DIAGNOSIS — Z681 Body mass index (BMI) 19 or less, adult: Secondary | ICD-10-CM | POA: Diagnosis not present

## 2024-03-28 DIAGNOSIS — Z809 Family history of malignant neoplasm, unspecified: Secondary | ICD-10-CM

## 2024-03-28 DIAGNOSIS — E1165 Type 2 diabetes mellitus with hyperglycemia: Secondary | ICD-10-CM | POA: Diagnosis present

## 2024-03-28 DIAGNOSIS — E119 Type 2 diabetes mellitus without complications: Secondary | ICD-10-CM | POA: Diagnosis not present

## 2024-03-28 DIAGNOSIS — R319 Hematuria, unspecified: Secondary | ICD-10-CM | POA: Insufficient documentation

## 2024-03-28 DIAGNOSIS — N3000 Acute cystitis without hematuria: Principal | ICD-10-CM

## 2024-03-28 DIAGNOSIS — Z87898 Personal history of other specified conditions: Secondary | ICD-10-CM

## 2024-03-28 DIAGNOSIS — G40909 Epilepsy, unspecified, not intractable, without status epilepticus: Secondary | ICD-10-CM | POA: Diagnosis present

## 2024-03-28 DIAGNOSIS — Z833 Family history of diabetes mellitus: Secondary | ICD-10-CM

## 2024-03-28 DIAGNOSIS — E871 Hypo-osmolality and hyponatremia: Secondary | ICD-10-CM | POA: Diagnosis present

## 2024-03-28 DIAGNOSIS — R64 Cachexia: Secondary | ICD-10-CM | POA: Diagnosis present

## 2024-03-28 DIAGNOSIS — I9589 Other hypotension: Secondary | ICD-10-CM | POA: Diagnosis present

## 2024-03-28 DIAGNOSIS — Z1612 Extended spectrum beta lactamase (ESBL) resistance: Secondary | ICD-10-CM | POA: Diagnosis present

## 2024-03-28 DIAGNOSIS — Z8673 Personal history of transient ischemic attack (TIA), and cerebral infarction without residual deficits: Secondary | ICD-10-CM | POA: Diagnosis not present

## 2024-03-28 DIAGNOSIS — Z811 Family history of alcohol abuse and dependence: Secondary | ICD-10-CM

## 2024-03-28 DIAGNOSIS — G8220 Paraplegia, unspecified: Secondary | ICD-10-CM | POA: Diagnosis present

## 2024-03-28 DIAGNOSIS — D649 Anemia, unspecified: Secondary | ICD-10-CM | POA: Diagnosis present

## 2024-03-28 DIAGNOSIS — Z823 Family history of stroke: Secondary | ICD-10-CM

## 2024-03-28 DIAGNOSIS — K219 Gastro-esophageal reflux disease without esophagitis: Secondary | ICD-10-CM | POA: Diagnosis present

## 2024-03-28 DIAGNOSIS — Z87440 Personal history of urinary (tract) infections: Secondary | ICD-10-CM

## 2024-03-28 DIAGNOSIS — Z8249 Family history of ischemic heart disease and other diseases of the circulatory system: Secondary | ICD-10-CM

## 2024-03-28 DIAGNOSIS — G4700 Insomnia, unspecified: Secondary | ICD-10-CM | POA: Diagnosis present

## 2024-03-28 LAB — URINALYSIS, ROUTINE W REFLEX MICROSCOPIC
Bilirubin Urine: NEGATIVE
Glucose, UA: NEGATIVE mg/dL
Ketones, ur: NEGATIVE mg/dL
Nitrite: NEGATIVE
Protein, ur: 100 mg/dL — AB
RBC / HPF: >50 RBC/hpf (ref 0–5)
Specific Gravity, Urine: 1.023 (ref 1.005–1.030)
WBC, UA: >50 WBC/hpf (ref 0–5)
pH: 5 (ref 5.0–8.0)

## 2024-03-28 LAB — CBC WITH DIFFERENTIAL/PLATELET
Abs Immature Granulocytes: 0.02 K/uL (ref 0.00–0.07)
Basophils Absolute: 0 K/uL (ref 0.0–0.1)
Basophils Relative: 1 %
Eosinophils Absolute: 0.1 K/uL (ref 0.0–0.5)
Eosinophils Relative: 2 %
HCT: 34.2 % — ABNORMAL LOW (ref 39.0–52.0)
Hemoglobin: 10.8 g/dL — ABNORMAL LOW (ref 13.0–17.0)
Immature Granulocytes: 0 %
Lymphocytes Relative: 21 %
Lymphs Abs: 1.6 K/uL (ref 0.7–4.0)
MCH: 29.2 pg (ref 26.0–34.0)
MCHC: 31.6 g/dL (ref 30.0–36.0)
MCV: 92.4 fL (ref 80.0–100.0)
Monocytes Absolute: 0.5 K/uL (ref 0.1–1.0)
Monocytes Relative: 7 %
Neutro Abs: 5.2 K/uL (ref 1.7–7.7)
Neutrophils Relative %: 69 %
Platelets: 315 K/uL (ref 150–400)
RBC: 3.7 MIL/uL — ABNORMAL LOW (ref 4.22–5.81)
RDW: 16 % — ABNORMAL HIGH (ref 11.5–15.5)
WBC: 7.4 K/uL (ref 4.0–10.5)
nRBC: 0 % (ref 0.0–0.2)

## 2024-03-28 LAB — GLUCOSE, CAPILLARY
Glucose-Capillary: 187 mg/dL — ABNORMAL HIGH (ref 70–99)
Glucose-Capillary: 209 mg/dL — ABNORMAL HIGH (ref 70–99)
Glucose-Capillary: 190 mg/dL — ABNORMAL HIGH (ref 70–99)
Glucose-Capillary: 170 mg/dL — ABNORMAL HIGH (ref 70–99)

## 2024-03-28 LAB — BASIC METABOLIC PANEL WITH GFR
Anion gap: 9 (ref 5–15)
BUN: 14 mg/dL (ref 6–20)
CO2: 27 mmol/L (ref 22–32)
Calcium: 8.8 mg/dL — ABNORMAL LOW (ref 8.9–10.3)
Chloride: 102 mmol/L (ref 98–111)
Creatinine, Ser: 0.53 mg/dL — ABNORMAL LOW (ref 0.61–1.24)
GFR, Estimated: >60 mL/min (ref >60)
Glucose, Bld: 83 mg/dL (ref 70–99)
Potassium: 4 mmol/L (ref 3.5–5.1)
Sodium: 138 mmol/L (ref 135–145)

## 2024-03-28 MED ORDER — SODIUM CHLORIDE 0.9% FLUSH
3.0000 mL | Freq: Two times a day (BID) | INTRAVENOUS | Status: DC
  Administered 2024-03-28 – 2024-04-01 (×8): 3 mL via INTRAVENOUS

## 2024-03-28 MED ORDER — PANCRELIPASE (LIP-PROT-AMYL) 12000-38000 UNITS PO CPEP
12000.0000 [IU] | ORAL_CAPSULE | Freq: Three times a day (TID) | ORAL | Status: DC
  Administered 2024-03-28 – 2024-04-01 (×13): 12000 [IU] via ORAL
  Filled 2024-03-28 (×14): qty 1

## 2024-03-28 MED ORDER — ACETAMINOPHEN 650 MG RE SUPP: 650.0000 mg | Freq: Four times a day (QID) | RECTAL | Status: DC | PRN

## 2024-03-28 MED ORDER — INSULIN ASPART 100 UNIT/ML IJ SOLN
0.0000 [IU] | Freq: Three times a day (TID) | INTRAMUSCULAR | Status: DC
  Administered 2024-03-28: 1 [IU] via SUBCUTANEOUS
  Administered 2024-03-28: 2 [IU] via SUBCUTANEOUS
  Administered 2024-03-29: 3 [IU] via SUBCUTANEOUS
  Administered 2024-03-29: 1 [IU] via SUBCUTANEOUS
  Administered 2024-03-30: 3 [IU] via SUBCUTANEOUS
  Administered 2024-03-30 – 2024-03-31 (×3): 1 [IU] via SUBCUTANEOUS
  Administered 2024-03-31: 3 [IU] via SUBCUTANEOUS
  Filled 2024-03-28: qty 0.06

## 2024-03-28 MED ORDER — INSULIN GLARGINE-YFGN 100 UNIT/ML ~~LOC~~ SOLN
10.0000 [IU] | Freq: Every day | SUBCUTANEOUS | Status: DC
  Administered 2024-03-28 – 2024-03-31 (×4): 10 [IU] via SUBCUTANEOUS
  Filled 2024-03-28 (×5): qty 0.1

## 2024-03-28 MED ORDER — ADULT MULTIVITAMIN W/MINERALS CH
1.0000 | ORAL_TABLET | Freq: Every day | ORAL | Status: DC
  Administered 2024-03-28 – 2024-04-01 (×5): 1 via ORAL
  Filled 2024-03-28 (×5): qty 1

## 2024-03-28 MED ORDER — SODIUM CHLORIDE 0.9 % IV SOLN: 250.0000 mL | INTRAVENOUS | Status: AC | PRN

## 2024-03-28 MED ORDER — FAMOTIDINE 20 MG PO TABS
20.0000 mg | ORAL_TABLET | Freq: Two times a day (BID) | ORAL | Status: DC
  Administered 2024-03-28 – 2024-04-01 (×9): 20 mg via ORAL
  Filled 2024-03-28 (×9): qty 1

## 2024-03-28 MED ORDER — TRAZODONE HCL 50 MG PO TABS
50.0000 mg | ORAL_TABLET | Freq: Every day | ORAL | Status: DC
  Administered 2024-03-28 – 2024-03-31 (×4): 50 mg via ORAL
  Filled 2024-03-28 (×4): qty 1

## 2024-03-28 MED ORDER — INSULIN ASPART 100 UNIT/ML IJ SOLN
0.0000 [IU] | Freq: Every day | INTRAMUSCULAR | Status: DC
  Administered 2024-03-30: 5 [IU] via SUBCUTANEOUS
  Administered 2024-03-31: 4 [IU] via SUBCUTANEOUS
  Filled 2024-03-28: qty 0.05

## 2024-03-28 MED ORDER — VITAMIN C 500 MG PO TABS
500.0000 mg | ORAL_TABLET | Freq: Every day | ORAL | Status: DC
Start: 2024-03-28 — End: 2024-04-01
  Administered 2024-03-28 – 2024-04-01 (×5): 500 mg via ORAL
  Filled 2024-03-28 (×5): qty 1

## 2024-03-28 MED ORDER — FERROUS SULFATE 325 (65 FE) MG PO TABS
325.0000 mg | ORAL_TABLET | Freq: Every day | ORAL | Status: DC
  Administered 2024-03-28 – 2024-04-01 (×5): 325 mg via ORAL
  Filled 2024-03-28 (×5): qty 1

## 2024-03-28 MED ORDER — LEVETIRACETAM 500 MG PO TABS
500.0000 mg | ORAL_TABLET | Freq: Two times a day (BID) | ORAL | Status: DC
  Administered 2024-03-28 – 2024-04-01 (×9): 500 mg via ORAL
  Filled 2024-03-28 (×9): qty 1

## 2024-03-28 MED ORDER — SODIUM CHLORIDE 0.9% FLUSH: 3.0000 mL | INTRAVENOUS | Status: DC | PRN

## 2024-03-28 MED ORDER — SODIUM CHLORIDE 0.9 % IV SOLN
1.0000 g | Freq: Three times a day (TID) | INTRAVENOUS | Status: DC
  Administered 2024-03-28 – 2024-03-30 (×8): 1 g via INTRAVENOUS
  Filled 2024-03-28 (×8): qty 20

## 2024-03-28 MED ORDER — ACETAMINOPHEN 325 MG PO TABS: 650.0000 mg | ORAL_TABLET | Freq: Four times a day (QID) | ORAL | Status: DC | PRN

## 2024-03-28 MED ORDER — OXYCODONE HCL 5 MG PO TABS
5.0000 mg | ORAL_TABLET | Freq: Four times a day (QID) | ORAL | Status: DC | PRN
  Administered 2024-03-28 – 2024-04-01 (×10): 5 mg via ORAL
  Filled 2024-03-28 (×11): qty 1

## 2024-03-28 MED ORDER — SODIUM CHLORIDE 0.9% FLUSH
3.0000 mL | Freq: Two times a day (BID) | INTRAVENOUS | Status: DC
  Administered 2024-03-28 – 2024-04-01 (×7): 3 mL via INTRAVENOUS

## 2024-03-28 MED ORDER — BUSPIRONE HCL 10 MG PO TABS
10.0000 mg | ORAL_TABLET | Freq: Two times a day (BID) | ORAL | Status: DC
  Administered 2024-03-28 – 2024-04-01 (×9): 10 mg via ORAL
  Filled 2024-03-28 (×9): qty 1

## 2024-03-28 MED ORDER — MIDODRINE HCL 5 MG PO TABS
10.0000 mg | ORAL_TABLET | Freq: Three times a day (TID) | ORAL | Status: DC
  Administered 2024-03-28 – 2024-04-01 (×13): 10 mg via ORAL
  Filled 2024-03-28 (×13): qty 2

## 2024-03-28 MED ORDER — SERTRALINE HCL 50 MG PO TABS
50.0000 mg | ORAL_TABLET | Freq: Every day | ORAL | Status: DC
  Administered 2024-03-28 – 2024-04-01 (×5): 50 mg via ORAL
  Filled 2024-03-28 (×5): qty 1

## 2024-03-28 MED ORDER — GLUCERNA SHAKE PO LIQD
1.0000 | Freq: Three times a day (TID) | ORAL | Status: DC
  Administered 2024-03-29 – 2024-03-31 (×8): 237 mL via ORAL
  Filled 2024-03-28 (×14): qty 237

## 2024-03-28 MED ORDER — ONDANSETRON HCL 4 MG PO TABS
4.0000 mg | ORAL_TABLET | Freq: Four times a day (QID) | ORAL | Status: DC | PRN
Start: 2024-03-28 — End: 2024-04-01

## 2024-03-28 MED ORDER — TAMSULOSIN HCL 0.4 MG PO CAPS
0.4000 mg | ORAL_CAPSULE | Freq: Every day | ORAL | Status: DC
  Administered 2024-03-28 – 2024-03-31 (×4): 0.4 mg via ORAL
  Filled 2024-03-28 (×4): qty 1

## 2024-03-28 MED ORDER — ROSUVASTATIN CALCIUM 5 MG PO TABS
5.0000 mg | ORAL_TABLET | Freq: Every day | ORAL | Status: DC
  Administered 2024-03-28 – 2024-03-31 (×4): 5 mg via ORAL
  Filled 2024-03-28 (×4): qty 1

## 2024-03-28 MED ORDER — ONDANSETRON HCL 4 MG/2ML IJ SOLN: 4.0000 mg | Freq: Four times a day (QID) | INTRAMUSCULAR | Status: DC | PRN

## 2024-03-28 NOTE — ED Triage Notes (Signed)
  Patient BIB EMS from [facility] for hematuria.  Facility noted patient bleeding from penis around 1800 in diaper change.  Patient denies any pain or injury.  Bed bound patient.  Incontinent and diapered at baseline.

## 2024-03-28 NOTE — ED Provider Notes (Signed)
 [facility]PT [facility] Emergency Department Provider Note MRN:  [id number]  Arrival date & time: 03/28/24     Chief Complaint   Hematuria   History of Present Illness   Brandon [name] is a 58 y.o. year-old male presents to the ED with chief complaint of suspected hematuria.  Patient has history of paraplegia, prior strokes, prior ESBL and MRSA.  Patient denies any complaints, other than the bad food where he is staying.  He denies fevers or chills.  He states that the facility where he is staying sent him here because they saw blood in his diaper..  History provided by patient.   Review of Systems  Pertinent positive and negative review of systems noted in HPI.    Physical Exam   Vitals:   03/28/24 0036 03/28/24 0422  BP: 101/70 130/89  Pulse: 60 68  Resp: 18 18  Temp: (!) 97.4 F (36.3 C) (!) 97.5 F (36.4 C)  SpO2: 99% 99%    CONSTITUTIONAL:  chronically ill-appearing, NAD NEURO:  Alert and oriented x 3, CN 3-12 grossly intact EYES:  eyes equal and reactive ENT/NECK:  Supple, no stridor  CARDIO:  normal rate, regular rhythm, appears well-perfused  PULM:  No respiratory distress, CTAB GI/GU:  non-distended,  MSK/SPINE:  No gross deformities, no edema, paraplegia  SKIN:  no rash,    *Additional and/or pertinent findings included in MDM below  Diagnostic and Interventional Summary    EKG Interpretation Date/Time:    Ventricular Rate:    PR Interval:    QRS Duration:    QT Interval:    QTC Calculation:   R Axis:      Text Interpretation:         Labs Reviewed  URINALYSIS, ROUTINE W REFLEX MICROSCOPIC - Abnormal; Notable for the following components:      Result Value   Color, Urine RED (*)    APPearance CLOUDY (*)    Hgb urine dipstick LARGE (*)    Protein, ur 100 (*)    Leukocytes,Ua SMALL (*)    Bacteria, UA RARE (*)    All other components within normal limits  CBC WITH DIFFERENTIAL/PLATELET - Abnormal; Notable for the following  components:   RBC 3.70 (*)    Hemoglobin 10.8 (*)    HCT 34.2 (*)    RDW 16.0 (*)    All other components within normal limits  BASIC METABOLIC PANEL WITH GFR - Abnormal; Notable for the following components:   Creatinine, Ser 0.53 (*)    Calcium  8.8 (*)    All other components within normal limits  URINE CULTURE  URINE CULTURE    No orders to display    Medications  meropenem  (MERREM ) 1 g in sodium chloride  0.9 % 100 mL IVPB (1 g Intravenous New Bag/Given 03/28/24 0502)  oxyCODONE  (Oxy IR/ROXICODONE ) immediate release tablet 5 mg (has no administration in time range)  midodrine  (PROAMATINE ) tablet 10 mg (has no administration in time range)  rosuvastatin  (CRESTOR ) tablet 5 mg (has no administration in time range)  busPIRone  (BUSPAR ) tablet 10 mg (has no administration in time range)  sertraline  (ZOLOFT ) tablet 50 mg (has no administration in time range)  traZODone  (DESYREL ) tablet 50 mg (has no administration in time range)  insulin  glargine-yfgn (SEMGLEE ) injection 10 Units (has no administration in time range)  famotidine  (PEPCID ) tablet 20 mg (has no administration in time range)  tamsulosin  (FLOMAX ) capsule 0.4 mg (has no administration in time range)  ferrous sulfate  tablet 325  mg (has no administration in time range)  levETIRAcetam  (KEPPRA ) tablet 500 mg (has no administration in time range)  sodium chloride  flush (NS) 0.9 % injection 3 mL (has no administration in time range)  sodium chloride  flush (NS) 0.9 % injection 3 mL (has no administration in time range)  sodium chloride  flush (NS) 0.9 % injection 3 mL (has no administration in time range)  0.9 %  sodium chloride  infusion (has no administration in time range)  acetaminophen  (TYLENOL ) tablet 650 mg (has no administration in time range)    Or  acetaminophen  (TYLENOL ) suppository 650 mg (has no administration in time range)  ondansetron  (ZOFRAN ) tablet 4 mg (has no administration in time range)    Or  ondansetron   (ZOFRAN ) injection 4 mg (has no administration in time range)     Procedures  /  Critical Care Procedures  ED Course and Medical Decision Making  I have reviewed the triage vital signs, the nursing notes, and pertinent available records from the EMR.  Social Determinants Affecting Complexity of Care: Patient has no clinically significant social determinants affecting this chief complaint..   ED Course:    Medical Decision Making Patient sent to the ER for hematuria.  He is paraplegic.  Urinates into a diaper.  Has history of ESBL and MRSA.  He denies fevers or chills.  His urinalysis is consistent with infection, though not as bad as his last.  No leukocytosis.  Kidney function appears about baseline.  Given his history of ESBL and also his recent urine culture of Klebsiella and E. coli, he is difficult to treat with oral antibiotic.  I think that he will need IV antibiotics until his culture results.  Amount and/or Complexity of Data Reviewed Labs: ordered.  Risk Decision regarding hospitalization.         Consultants: I consulted with Hospitalist, Dr. Sundil, who is appreciated for admitting.   Treatment and Plan: Patient's exam and diagnostic results are concerning for hematuria and UTI with hx of ESBL.  Feel that patient will need admission to the hospital for further treatment and evaluation.    Final Clinical Impressions(s) / ED Diagnoses     ICD-10-CM   1. Acute cystitis without hematuria  N30.00       ED Discharge Orders     None         Discharge Instructions Discussed with and Provided to Patient:   Discharge Instructions   None      Vicky Charleston, PA-C 03/28/24 0536    Trine Raynell Moder, MD 03/28/24 [phone]

## 2024-03-28 NOTE — Progress Notes (Addendum)
 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  Patient noted to be wheelchair-bound at baseline.  Urinalysis done concerning for UTI.  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  Patient placed empirically on IV Merrem .  CT pelvis pending.   Assessment & Plan:   Principal Problem:   Acute cystitis Active Problems:   History of CVA (cerebrovascular accident)   Hyperlipidemia   Essential hypertension   Normocytic anemia   Insulin  dependent type 2 diabetes mellitus (HCC)   H/O supraventricular tachycardia   Hematuria   History of seizure   Chronic hypotension   Pancreatic insufficiency   Generalized anxiety disorder   Acute cystitis with hematuria   #1 acute cystitis with hematuria - Patient presented to the ED from nursing home due to complaints of hematuria. -Urinalysis done concerning for UTI and hematuria. - Patient with prior history of ESBL associated UTI with most recent UTI in April 2025 with urine cultures growing E. coli and Klebsiella which was multidrug-resistant and only sensitive to Merrem  and Zosyn. - It is noted per admitting physician that on chart review from April 2025 patient did have  emphysematous cystitis per CT scan which showed gas in the urinary bladder wall and gas in the urinary bladder lumen and mild extravesicular extension of gas along both pelvic sidewalls and along the right space of Retzius.  No well-defined drainable pelvic abscess noted at that time - During prior hospitalization patient seen by urology cystoscopy offered however patient declined and eventually patient was scheduled for outpatient cystoscopy. - Urine cultures pending. -CT pelvis pending. - Continue IV Merrem . - Supportive care.  2.  History of CVA with hemiplegia-wheelchair-bound -Continue to hold Plavix  secondary to problem #1.  3.  History of CAD -Plavix  on hold in the setting of hematuria. - Once hematuria resolved May resume Plavix . - Crestor .  4.  History of chronic hypotension -Continue home regimen midodrine  10 mg 3 times daily.  5.  Normocytic anemia -Hemoglobin currently stable at 10.8.  6.  Insulin -dependent type 2 diabetes -Hemoglobin A1c 9.4 (01/07/2024). - CBG 187 this morning - Continue home regimen Semglee  10 units daily, SSI.  7.  History of SVT -Follow.  8.  History of seizures -Continue home regimen Keppra  twice daily.  9.  Generalized anxiety disorder -Continue Zoloft , BuSpar .  10.  BPH -Flomax .  11.  Insomnia -Continue home regimen trazodone .  12.  Chronic pancreatitis with pancreatic insufficiency -Continue Creon  3 times daily with meals.  13.  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 - It is noted that patient was evaluated by neurosurgery and per neurosurgery no intervention offered as it would not change the outcome.  Patient was subsequently discharged to SNF. -  PT/ OT.  14.  Hyperlipidemia -Continue statin.  15.  Multiple pressure wounds -Consult with [facility].   DVT prophylaxis: SCDs Code Status: Full Family  Communication: Updated patient.  No family at bedside. Disposition: Likely back to nursing facility when clinically improved and medically stable.  Status is: Inpatient Remains inpatient appropriate because: Severity of illness   Consultants:  None  Procedures:  CT pelvis pending 03/28/2024   Antimicrobials:  Anti-infectives (From admission, onward)    Start     Dose/Rate Route Frequency Ordered Stop   03/28/24 0600  meropenem  (MERREM ) 1 g in sodium chloride  0.9 % 100 mL IVPB        1 g 200 mL/hr over 30 Minutes Intravenous Every 8 hours 03/28/24 0451           Subjective: Patient laying in bed in the fetal position asking to be rolled over.  Patient with complaints of thigh pain.  Denies any chest pain or shortness of breath.  No abdominal pain.  Foley catheter with dark reddish urine.  Objective: Vitals:   03/28/24 0036 03/28/24 0422 03/28/24 0643 03/28/24 0645  BP: 101/70 130/89 119/71   Pulse: 60 68 64   Resp: 18 18 18    Temp: (!) 97.4 F (36.3 C) (!) 97.5 F (36.4 C) (!) 97 F (36.1 C)   TempSrc: Oral Oral    SpO2: 99% 99% 100%   Weight:    64 kg  Height:    6' (1.829 m)    Intake/Output Summary (Last 24 hours) at 03/28/2024 1024 Last data filed at 03/28/2024 0600 Gross per 24 hour  Intake 101.32 ml  Output --  Net 101.32 ml   Filed Weights   03/28/24 0645  Weight: 64 kg    Examination:  General exam: Appears calm and comfortable.  Cachectic.  Chronically ill appearing. Respiratory system: Clear to auscultation.  No wheezes, no crackles, no rhonchi.  Fair air movement.  Speaking in full sentences.  Respiratory effort normal. Cardiovascular system: S1 & S2 heard, RRR. No JVD, murmurs, rubs, gallops or clicks. No pedal edema. Gastrointestinal system: Abdomen is nondistended, soft and nontender. No organomegaly or masses felt. Normal bowel sounds heard. Central nervous system: Alert and oriented. No focal neurological deficits. Extremities: No clubbing  cyanosis or edema. Skin: No rashes, lesions or ulcers Psychiatry: Judgement and insight appear normal. Mood & affect appropriate.     Data Reviewed: I have personally reviewed following labs and imaging studies  CBC: Recent Labs  Lab 03/28/24 0145  WBC 7.4  NEUTROABS 5.2  HGB 10.8*  HCT 34.2*  MCV 92.4  PLT 315    Basic Metabolic Panel: Recent Labs  Lab 03/28/24 0145  NA 138  K 4.0  CL 102  CO2 27  GLUCOSE 83  BUN 14  CREATININE 0.53*  CALCIUM  8.8*    GFR: Estimated Creatinine Clearance: 90 mL/min (A) (by C-G formula based on SCr of 0.53 mg/dL (L)).  Liver Function Tests: No results for input(s): AST, ALT, ALKPHOS, BILITOT, PROT, ALBUMIN  in the last 168 hours.  CBG: Recent Labs  Lab 03/28/24 0749  GLUCAP 187*     No results found for this or any previous visit (from the past 240 hours).       Radiology Studies: No results found.      Scheduled Meds:  busPIRone   10 mg Oral BID   famotidine   20 mg Oral BID   ferrous sulfate   325 mg Oral Q breakfast   insulin  aspart  0-5 Units Subcutaneous QHS   insulin  aspart  0-6 Units Subcutaneous TID WC   insulin  glargine-yfgn  10 Units Subcutaneous Daily   levETIRAcetam   500 mg Oral BID   lipase/protease/amylase  12,000 Units Oral TID AC   midodrine   10 mg Oral TID WC   rosuvastatin   5 mg Oral QHS   sertraline   50 mg Oral Daily   sodium chloride  flush  3 mL Intravenous Q12H   sodium chloride  flush  3 mL Intravenous Q12H   tamsulosin   0.4 mg Oral QPC supper   traZODone   50 mg Oral QHS   Continuous Infusions:  sodium chloride      meropenem  (MERREM ) IV 1 g (03/28/24 0502)     LOS: 0 days    Time spent: 40 minutes  No charge    Toribio Hummer, MD [facility]   To contact the attending provider between 7A-7P or the covering provider during after hours 7P-7A, please log into the web site [email or web address] and access using universal [place] password for that web site. If  you do not have the password, please call the hospital operator.  03/28/2024, 10:24 AM

## 2024-03-28 NOTE — H&P (Addendum)
 History and Physical    Brandon [name] FMW:[id number] DOB: 1965/12/21 DOA: 03/28/2024  PCP: Donal Channing SQUIBB, FNP   Patient coming from: Home   Chief Complaint:  Chief Complaint  Patient presents with   Hematuria   ED TRIAGE note:    Patient BIB EMS from [facility] for hematuria.  Facility noted patient bleeding from penis around 1800 in diaper change.  Patient denies any pain or injury.  Bed bound patient.  Incontinent and diapered at baseline.             HPI:  Brandon [name] is a 58 y.o. 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, CAD s/p cardiac stenting PCI 2016, essential hypertension, hyperlipidemia, seizure disorder, prior history of smoking cigarette and alcohol  use,  history of thoracic discitis with ventral epidural abscess in 12/2023 resulted in cord compression-completed 2 months of antibiotic (not a candidate for any procedure per IR neurosurgery evaluation during most recent hospitalization April 2025, history of SVT, chronic hypotension,  transaminitis, normocytic anemia, chronic pancreatitis with pancreatic insufficiency, BPH, insulin -dependent DM type II presenting from nursing home for evaluation for hematuria. Patient is wheelchair-bound at baseline.  Patient denies any fever, chill, abdominal pain, dysuria, burning sensation and flank pain. No other complaint at this time.  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  Patient was offered cystoscopy which patient declined and has been scheduled for outpatient cystoscopy.   ED Course:  At presentation to ED patient is hemodynamically stable. UA red appearance, and evidence of UTI. BMP unremarkable. CBC unremarkable.  Per chart review UA from April 2025 grew Klebsiella pneumoniae and E. coli, multidrug resistant wildly sensitive to Zosyn and imipenem.  In the ED patient has been started on  imipenem.  Hospitalist has been consulted for further management of hematuria secondary to acute cystitis.  Significant labs in the ED: Lab Orders         Urine Culture         Urine Culture (for pregnant, neutropenic or urologic patients or patients with an indwelling urinary catheter)         Urinalysis, Routine w reflex microscopic -Urine, Clean Catch         CBC with Differential         Basic metabolic panel         CBC         Comprehensive metabolic panel       Review of Systems:  Review of Systems  Constitutional:  Negative for chills, fever and weight loss.  Respiratory:  Negative for cough.   Cardiovascular:  Negative for chest pain and palpitations.  Gastrointestinal:  Negative for abdominal pain, heartburn, nausea and vomiting.  Genitourinary:  Positive for hematuria. Negative for dysuria, flank pain, frequency and urgency.  Musculoskeletal:  Negative for myalgias and neck pain.  Neurological:  Negative for dizziness and headaches.  Psychiatric/Behavioral:  The patient is not nervous/anxious.     Past Medical History:  Diagnosis Date   Alcohol  dependency (HCC)    Hx ETOH withdrawal seizures before 2011   CAD (coronary artery disease) 06/13/2012   Calcification noted on CTA of chest in 2012 Wall motion abnormality on ECHO     Carotid artery disease (HCC) 2016   bilateral.  s/p left carotid stent 03/2015   CVA due to right ICA occlusion 06/13/2012, 02/2015   right ICA occlusion 05/2012, right MCA  CVA 02/2016.    Depression with anxiety    Diabetes mellitus without complication (HCC)    GERD (gastroesophageal reflux disease)    Headache(784.0)    migraine   Heart disease 02/2015   PCI/DES placed to RCA: on chronic Plavix /ASA   Hyperlipidemia    Hypertension    Pancreatitis 2011....    CT findings in May 2011 with inflammation and pseudocyst.  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/2016   Renal artery stenosis, native, bilateral (HCC) 02/2015    Past Surgical History:   Procedure Laterality Date   CARDIAC CATHETERIZATION N/A 03/15/2015   Procedure: Left Heart Cath;  Surgeon: Denyse DELENA Bathe, MD;  Location: [facility] INVASIVE CV LAB;  Service: Cardiovascular;  Laterality: N/A;   CARDIAC CATHETERIZATION N/A 03/16/2015   Procedure: Coronary Stent Intervention;  Surgeon: Cara JONETTA Lovelace, MD;  Location: [facility] INVASIVE CV LAB;  Service: Cardiovascular;  Laterality: N/A;   CAROTID ANGIOGRAM N/A 06/15/2012   Procedure: CAROTID ANGIOGRAM;  Surgeon: Lonni GORMAN Blade, MD;  Location: [facility] CATH LAB;  Service: Cardiovascular;  Laterality: N/A;   ESOPHAGOGASTRODUODENOSCOPY N/A 03/08/2016   Procedure: ESOPHAGOGASTRODUODENOSCOPY (EGD);  Surgeon: Elspeth Deward Naval, MD;  Location: [facility] ENDOSCOPY;  Service: Gastroenterology;  Laterality: N/A;   none     PERIPHERAL VASCULAR CATHETERIZATION Left 04/06/2015   Procedure: Carotid PTA/Stent Intervention;  Surgeon: Selinda GORMAN Gu, MD;  Location: [facility] INVASIVE CV LAB;  Service: Cardiovascular;  Laterality: Left;     reports that he has been smoking cigarettes. He has a 30 pack-year smoking history. He has never used smokeless tobacco. He reports that he does not drink alcohol  and does not use drugs.  No Known Allergies  Family History  Problem Relation Age of Onset   Stroke Mother        deceased   Coronary artery disease Mother    Alcohol  abuse Mother    Cancer Mother    Hypertension Father        alive   Alcohol  abuse Father    Diabetes Father    Kidney disease Father    Stroke Maternal Grandmother     Prior to Admission medications   Medication Sig Start Date End Date Taking? Authorizing Provider  acetaminophen  (TYLENOL ) 325 MG tablet Take 650 mg by mouth every 6 (six) hours as needed for mild pain (pain score 1-3) (cannot exceed a total of 3,000 mg/24 hours).   Yes [provider]  ascorbic acid  (VITAMIN C ) 500 MG tablet Take 500 mg by mouth daily. 11/26/21  Yes [provider]  busPIRone  (BUSPAR ) 10 MG  tablet Take 1 tablet (10 mg total) by mouth 2 (two) times daily. 12/23/20  Yes Gherghe, Costin M, MD  clopidogrel  (PLAVIX ) 75 MG tablet Take 1 tablet (75 mg total) by mouth daily. 08/14/23  Yes Elnora Ip, MD  famotidine  (PEPCID ) 20 MG tablet Take 1 tablet (20 mg total) by mouth 2 (two) times daily. 12/23/20  Yes Gherghe, Costin M, MD  feeding supplement, GLUCERNA SHAKE, (GLUCERNA SHAKE) LIQD Take 1 Container by mouth in the morning, at noon, and at bedtime.   Yes [provider]  FEROSUL 325 (65 Fe) MG tablet Take 1 tablet (325 mg total) by mouth daily. 12/23/20  Yes Gherghe, Costin M, MD  insulin  glargine (LANTUS ) 100 UNIT/ML injection Inject 10 Units into the skin daily.   Yes [provider]  insulin  lispro (HUMALOG KWIKPEN) 100 UNIT/ML KwikPen Inject 0-5 Units into the skin in the morning, at noon, and at bedtime. BS 70-130  inject 0 units  BS 131-180 inject 1 units  BS 181-240 inject 2 units  BS 241-300 inject 3 units  BS 301-350 inject 4 units  BS 351-400 inject 5 units   Yes [provider]  levETIRAcetam  (KEPPRA ) 500 MG tablet Take 500 mg by mouth 2 (two) times daily.   Yes [provider]  lipase/protease/amylase (CREON ) 36000 UNITS CPEP capsule Take 1 capsule (36,000 Units total) by mouth 3 (three) times daily with meals. 08/01/23  Yes [name], [place], MD  LORazepam  (ATIVAN ) 2 MG/ML concentrated solution Take 1 mg by mouth daily as needed for anxiety.   Yes [provider]  midodrine  (PROAMATINE ) 10 MG tablet Take 1 tablet (10 mg total) by mouth 3 (three) times daily with meals. Please hold for systolic blood pressure more than 110. 01/21/24  Yes Elgergawy, Brayton RAMAN, MD  Multiple Vitamin (MULTIVITAMIN WITH MINERALS) TABS tablet Take 1 tablet by mouth daily. 12/23/20  Yes Gherghe, Costin M, MD  mupirocin  ointment (BACTROBAN ) 2 % Apply topically 2 (two) times daily. Patient taking differently: Apply 1 Application topically daily.  08/01/23  Yes [name], [place], MD  nystatin cream (MYCOSTATIN) Apply 1 Application topically every 4 (four) hours as needed for dry skin.   Yes [provider]  ondansetron  (ZOFRAN ) 4 MG tablet Take 4 mg by mouth every 6 (six) hours as needed for nausea or vomiting.   Yes [provider]  oxyCODONE  (OXY IR/ROXICODONE ) 5 MG immediate release tablet Take 1 tablet (5 mg total) by mouth every 6 (six) hours as needed for moderate pain (pain score 4-6) or severe pain (pain score 7-10). 01/20/24  Yes Singh, Prashant K, MD  polyethylene glycol (MIRALAX  / GLYCOLAX ) 17 g packet Take 17 g by mouth daily as needed. Patient taking differently: Take 17 g by mouth daily. 01/20/24  Yes Dennise Lavada POUR, MD  rosuvastatin  (CRESTOR ) 5 MG tablet Take 5 mg by mouth at bedtime.   Yes [provider]  senna (SENOKOT) 8.6 MG TABS tablet Take 2 tablets by mouth in the morning and at bedtime.   Yes [provider]  sertraline  (ZOLOFT ) 50 MG tablet Take 50 mg by mouth daily.   Yes [provider]  tamsulosin  (FLOMAX ) 0.4 MG CAPS capsule Take 1 capsule (0.4 mg total) by mouth daily after supper. 08/01/23  Yes [name], [place], MD  traZODone  (DESYREL ) 50 MG tablet Take 1 tablet (50 mg total) by mouth at bedtime. 01/20/24  Yes Singh, Prashant K, MD  zinc oxide 20 % ointment Apply 1 Application topically in the morning and at bedtime.   Yes [provider]  levETIRAcetam  (KEPPRA ) 500 MG tablet Take 1 tablet (500 mg total) by mouth 2 (two) times daily for 2 days. 08/04/23 03/08/24  Elnora Ip, MD     Physical Exam: Vitals:   03/28/24 0036 03/28/24 0422  BP: 101/70 130/89  Pulse: 60 68  Resp: 18 18  Temp: (!) 97.4 F (36.3 C) (!) 97.5 F (36.4 C)  TempSrc: Oral Oral  SpO2: 99% 99%    Physical Exam Constitutional:      Appearance: He is ill-appearing.     Comments: Cachectic appearance.  HENT:     Mouth/Throat:     Mouth: Mucous  membranes are moist.  Eyes:     Pupils: Pupils are equal, round, and reactive to light.  Cardiovascular:     Rate and Rhythm: Normal rate and regular rhythm.     Pulses: Normal pulses.  Heart sounds: Normal heart sounds.  Pulmonary:     Effort: Pulmonary effort is normal.     Breath sounds: Normal breath sounds.  Abdominal:     Palpations: Abdomen is soft.     Tenderness: There is no abdominal tenderness.  Musculoskeletal:     Cervical back: Neck supple.  Skin:    Capillary Refill: Capillary refill takes less than 2 seconds.  Neurological:     Mental Status: He is oriented to person, place, and time.  Psychiatric:        Mood and Affect: Mood normal.      Labs on Admission: I have personally reviewed following labs and imaging studies  CBC: Recent Labs  Lab 03/28/24 0145  WBC 7.4  NEUTROABS 5.2  HGB 10.8*  HCT 34.2*  MCV 92.4  PLT 315   Basic Metabolic Panel: Recent Labs  Lab 03/28/24 0145  NA 138  K 4.0  CL 102  CO2 27  GLUCOSE 83  BUN 14  CREATININE 0.53*  CALCIUM  8.8*   GFR: CrCl cannot be calculated (Unknown ideal weight.). Liver Function Tests: No results for input(s): AST, ALT, ALKPHOS, BILITOT, PROT, ALBUMIN  in the last 168 hours. No results for input(s): LIPASE, AMYLASE in the last 168 hours. No results for input(s): AMMONIA in the last 168 hours. Coagulation Profile: No results for input(s): INR, PROTIME in the last 168 hours. Cardiac Enzymes: No results for input(s): CKTOTAL, CKMB, CKMBINDEX, TROPONINI, TROPONINIHS in the last 168 hours. BNP (last 3 results) No results for input(s): BNP in the last 8760 hours. HbA1C: No results for input(s): HGBA1C in the last 72 hours. CBG: No results for input(s): GLUCAP in the last 168 hours. Lipid Profile: No results for input(s): CHOL, HDL, LDLCALC, TRIG, CHOLHDL, LDLDIRECT in the last 72 hours. Thyroid  Function Tests: No results for input(s):  TSH, T4TOTAL, FREET4, T3FREE, THYROIDAB in the last 72 hours. Anemia Panel: No results for input(s): VITAMINB12, FOLATE, FERRITIN, TIBC, IRON, RETICCTPCT in the last 72 hours. Urine analysis:    Component Value Date/Time   COLORURINE RED (A) 03/28/2024 0356   APPEARANCEUR CLOUDY (A) 03/28/2024 0356   LABSPEC 1.023 03/28/2024 0356   PHURINE 5.0 03/28/2024 0356   GLUCOSEU NEGATIVE 03/28/2024 0356   HGBUR LARGE (A) 03/28/2024 0356   BILIRUBINUR NEGATIVE 03/28/2024 0356   KETONESUR NEGATIVE 03/28/2024 0356   PROTEINUR 100 (A) 03/28/2024 0356   UROBILINOGEN 0.2 02/16/2014 1742   NITRITE NEGATIVE 03/28/2024 0356   LEUKOCYTESUR SMALL (A) 03/28/2024 0356    Radiological Exams on Admission: I have personally reviewed images No results found.   Assessment/Plan: Principal Problem:   Acute cystitis Active Problems:   History of CVA (cerebrovascular accident)   Hyperlipidemia   Essential hypertension   Normocytic anemia   Insulin  dependent type 2 diabetes mellitus (HCC)   H/O supraventricular tachycardia   Hematuria   History of seizure   Chronic hypotension   Pancreatic insufficiency   Generalized anxiety disorder   Acute cystitis with hematuria    Assessment and Plan: Acute cystitis with hematuria Chronic indwelling Foley catheter -Presented to emergency department from nursing home with complaining of hematuria. - At presentation to ED patient is hemodynamically stable.  CBC unremarkable and BMP unremarkable. - UA showing evidence of UTI and hematuria - Per chart review history of ESBL associated UTI and most recent UTI in April 2025 urine culture grew E. coli Klebsiella which is multidrug resistance only sensitive to meropenem  and Zosyn. -Per chart review in April 2025 patient  has emphysematous cystitis.  CT abdomen showed Extensive emphysematous cystitis with gas in the urinary bladder wall and gas in the urinary bladder lumen ,and mild  extravesical extension of gas along both pelvic sidewalls and along the right Space of Retzius. No well-defined drainable pelvic abscess. -During last hospitalization patient has been offered cystoscopy by urology however declined and eventually has been scheduled for outpatient cystoscopy. -In the ED meropenem  has been initiated. -Given patient has previous history of emphysematous cystitis obtaining CT pelvis without contrast to follow-up redevelopment of emphysematous cystitis in the setting of hematuria. -Continue IV meropenem  with pharmacy consult.  Need to follow-up with urine culture for appropriate antibiotic guidance. -Please follow-up with CT pelvis result.  History of CVA with hemiplegia-wheelchair-bound -Holding Plavix .  In the setting of hematuria.  History of CAD -Holding Plavix  in the setting of hematuria.  Once hematuria will be improved or resolves can restart. - Continue Crestor   History of essential  hypertension Chronic hypotension -Continue midodrine  10 mg 3 times daily  Normocytic anemia -Stable H&H continue to monitor  Insulin -dependent DM type II Continue Lantus  10 unit daily and sliding scale insulin  with mealtime coverage.SABRA  History of supraventricular tachycardia -Continue cardiac monitoring.  History of seizure -Continue Keppra  twice daily  Generalized anxiety disorder -Continue Zoloft   BPH - Continue Flomax   Insomnia - Continue trazodone  at bedtime  Chronic pancreatitis with pancreatic insufficiency - Continue Creon  3 times daily with meals  History of epidural abscess with T10-T11 and L5-S1 cord compression and cauda equina syndrome  in April 2025 - Patient completed 2 months course of antibiotic.  Being evaluated by neurosurgery per neurosurgery no intervention has been  offered given it will not going to change outcome.  Patient has been discharged to SNF.   DVT prophylaxis:  SCD.  Deferring pharmacological DVT prophylaxis in the setting  of hematuria. Code Status:  Full Code Diet: Heart healthy carb modified diet Family Communication:   Family was present at bedside, at the time of interview. Opportunity was given to ask question and all questions were answered satisfactorily.  Disposition Plan: Need to follow-up with urine culture result for antibiotic guidance. Consults: None indicated Admission status:   Inpatient, Telemetry bed  Severity of Illness: The appropriate patient status for this patient is INPATIENT. Inpatient status is judged to be reasonable and necessary in order to provide the required intensity of service to ensure the patient's safety. The patient's presenting symptoms, physical exam findings, and initial radiographic and laboratory data in the context of their chronic comorbidities is felt to place them at high risk for further clinical deterioration. Furthermore, it is not anticipated that the patient will be medically stable for discharge from the hospital within 2 midnights of admission.   * I certify that at the point of admission it is my clinical judgment that the patient will require inpatient hospital care spanning beyond 2 midnights from the point of admission due to high intensity of service, high risk for further deterioration and high frequency of surveillance required.DEWAINE    Brandon Lukas, MD [facility]  How to contact the [facility] Attending or Consulting provider 7A - 7P or covering provider during after hours 7P -7A, for this patient.  Check the care team in [facility] and look for a) attending/consulting [facility] provider listed and b) the [facility] team listed Log into [email or web address] and use [facility] universal password to access. If you do not have the password, please contact the hospital operator. Locate the [facility] provider you  are looking for under [facility] and page to a number that you can be directly reached. If you still have difficulty reaching the provider, please page the [facility] (Director  on Call) for the Hospitalists listed on amion for assistance.  03/28/2024, 6:33 AM

## 2024-03-28 NOTE — ED Notes (Signed)
 Condom cath was placed on the pt.

## 2024-03-28 NOTE — Consult Note (Signed)
 WOC team consulted for wounds. Secure chat sent to primary nurse requesting photo documentation for consult.   Please note that the [facility] nursing team is utilizing a standardized work plan to manage patient consults. We are triaging consults and will try to see the patients within 48 hours. Wound photos in the patient's chart allow us  to consult on the patient in the most efficient and timely manner.    Thank you,    Powell Bar MSN, RN-BC, [employer]

## 2024-03-28 NOTE — Plan of Care (Signed)

## 2024-03-29 ENCOUNTER — Inpatient Hospital Stay (HOSPITAL_COMMUNITY)

## 2024-03-29 DIAGNOSIS — E119 Type 2 diabetes mellitus without complications: Secondary | ICD-10-CM | POA: Diagnosis not present

## 2024-03-29 DIAGNOSIS — N3001 Acute cystitis with hematuria: Secondary | ICD-10-CM | POA: Diagnosis not present

## 2024-03-29 DIAGNOSIS — I10 Essential (primary) hypertension: Secondary | ICD-10-CM | POA: Diagnosis not present

## 2024-03-29 DIAGNOSIS — Z8673 Personal history of transient ischemic attack (TIA), and cerebral infarction without residual deficits: Secondary | ICD-10-CM | POA: Diagnosis not present

## 2024-03-29 LAB — GLUCOSE, CAPILLARY
Glucose-Capillary: 105 mg/dL — ABNORMAL HIGH (ref 70–99)
Glucose-Capillary: 262 mg/dL — ABNORMAL HIGH (ref 70–99)
Glucose-Capillary: 160 mg/dL — ABNORMAL HIGH (ref 70–99)
Glucose-Capillary: 180 mg/dL — ABNORMAL HIGH (ref 70–99)

## 2024-03-29 NOTE — Plan of Care (Signed)
  Problem: Metabolic: Goal: Ability to maintain appropriate glucose levels will improve Outcome: Progressing   Problem: Clinical Measurements: Goal: Ability to maintain clinical measurements within normal limits will improve Outcome: Progressing Goal: Will remain free from infection Outcome: Progressing Goal: Diagnostic test results will improve Outcome: Progressing   Problem: Elimination: Goal: Will not experience complications related to urinary retention Outcome: Progressing

## 2024-03-29 NOTE — Plan of Care (Signed)
   Problem: Coping: Goal: Ability to adjust to condition or change in health will improve Outcome: Progressing   Problem: Metabolic: Goal: Ability to maintain appropriate glucose levels will improve Outcome: Progressing   Problem: Nutritional: Goal: Maintenance of adequate nutrition will improve Outcome: Progressing

## 2024-03-29 NOTE — Progress Notes (Signed)
 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  Patient noted to be wheelchair-bound at baseline.  Urinalysis done concerning for UTI.  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  Patient placed empirically on IV Merrem .  CT pelvis pending.   Assessment & Plan:   Principal Problem:   Acute cystitis Active Problems:   History of CVA (cerebrovascular accident)   Hyperlipidemia   Essential hypertension   Normocytic anemia   Insulin  dependent type 2 diabetes mellitus (HCC)   H/O supraventricular tachycardia   Hematuria   History of seizure   Chronic hypotension   Pancreatic insufficiency   Generalized anxiety disorder   Acute cystitis with hematuria   #1 acute cystitis with hematuria - Patient presented to the ED from nursing home due to complaints of hematuria. -Urinalysis done concerning for UTI and hematuria. - Patient with prior history of ESBL associated UTI with most recent UTI in April 2025 with urine cultures growing E. coli and Klebsiella which was multidrug-resistant and only sensitive to Merrem  and Zosyn. - It is noted per admitting physician that on chart review from April 2025 patient did have  emphysematous cystitis per CT scan which showed gas in the urinary bladder wall and gas in the urinary bladder lumen and mild extravesicular extension of gas along both pelvic sidewalls and along the right space of Retzius.  No well-defined drainable pelvic abscess noted at that time - During prior hospitalization patient seen by urology cystoscopy offered however patient declined and eventually patient was scheduled for outpatient cystoscopy. - Urine cultures pending. -CT pelvis ordered however patient noted to be refusing. - Continue IV Merrem . - Supportive care.  2.  History of CVA with hemiplegia-wheelchair-bound -Continue to hold Plavix  secondary to problem #1.  3.  History of CAD -Plavix  on hold in the setting of hematuria. - Once hematuria resolved May resume Plavix . - Continue Crestor .  4.  History of chronic hypotension - Midodrine  10 mg 3 times daily.   5.  Normocytic anemia -Hemoglobin currently stable at 10.8.  6.  Insulin -dependent type 2 diabetes -Hemoglobin A1c 9.4 (01/07/2024). - CBG 105 this morning - Continue home regimen Semglee  10 units daily, SSI.  7.  History of SVT -Follow.  8.  History of seizures -Continue home regimen Keppra  twice daily.  9.  Generalized anxiety disorder - Continue BuSpar , Zoloft .   10.  BPH - Continue Flomax .  11.  Insomnia -Continue home regimen trazodone .  12.  Chronic pancreatitis with pancreatic insufficiency - Creon  3 times daily with meals.   13.  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 - It is noted that patient was evaluated by neurosurgery and per neurosurgery no intervention offered as it would  not change the outcome.  Patient was subsequently discharged to SNF. - PT/ OT.  14.  Hyperlipidemia - Statin.    DVT prophylaxis: SCDs Code Status: Full Family Communication: Updated patient.  No  family at bedside. Disposition: Likely back to nursing facility when clinically improved and medically stable.  Status is: Inpatient Remains inpatient appropriate because: Severity of illness   Consultants:  None  Procedures:  CT pelvis pending 03/28/2024--patient refused.   Antimicrobials:  Anti-infectives (From admission, onward)    Start     Dose/Rate Route Frequency Ordered Stop   03/28/24 0600  meropenem  (MERREM ) 1 g in sodium chloride  0.9 % 100 mL IVPB        1 g 200 mL/hr over 30 Minutes Intravenous Every 8 hours 03/28/24 0451           Subjective: Patient laying in fetal position.  Denies any chest pain or shortness of breath.  No abdominal pain.  Noted to be refusing CT scan of the pelvis.  Urine dark and tea colored.  Objective: Vitals:   03/28/24 1420 03/28/24 1813 03/28/24 1953 03/29/24 0532  BP: 132/75 (!) 154/80 103/70 116/89  Pulse: 81 (!) 56 61 66  Resp: 18 18 15 15   Temp: 97.8 F (36.6 C) 98.1 F (36.7 C) 97.7 F (36.5 C) 98.3 F (36.8 C)  TempSrc: Oral     SpO2: 97% 99% 100% 98%  Weight:      Height:        Intake/Output Summary (Last 24 hours) at 03/29/2024 1133 Last data filed at 03/29/2024 0840 Gross per 24 hour  Intake 1300 ml  Output --  Net 1300 ml   Filed Weights   03/28/24 0645  Weight: 64 kg    Examination:  General exam: Appears calm and comfortable.  Cachectic.  Chronically ill appearing. Respiratory system: Lungs clear to auscultation bilaterally.  No wheezes, no crackles, no rhonchi.  Fair air movement.  Speaking in full sentences.  Normal respiratory effort.   Cardiovascular system: Regular rate rhythm no murmurs rubs or gallops.  No JVD.  No lower extremity edema.  Gastrointestinal system: Abdomen soft, nondistended, nontender, positive bowel sounds.  No rebound.  No guarding.  Central nervous system: Alert and oriented. No focal neurological deficits. Extremities: No clubbing cyanosis or edema. Skin: No rashes, lesions or  ulcers Psychiatry: Judgement and insight appear fair. Mood & affect appropriate.     Data Reviewed: I have personally reviewed following labs and imaging studies  CBC: Recent Labs  Lab 03/28/24 0145  WBC 7.4  NEUTROABS 5.2  HGB 10.8*  HCT 34.2*  MCV 92.4  PLT 315    Basic Metabolic Panel: Recent Labs  Lab 03/28/24 0145  NA 138  K 4.0  CL 102  CO2 27  GLUCOSE 83  BUN 14  CREATININE 0.53*  CALCIUM  8.8*    GFR: Estimated Creatinine Clearance: 90 mL/min (A) (by C-G formula based on SCr of 0.53 mg/dL (L)).  Liver Function Tests: No results for input(s): AST, ALT, ALKPHOS, BILITOT, PROT, ALBUMIN  in the last 168 hours.  CBG: Recent Labs  Lab 03/28/24 0749 03/28/24 1158 03/28/24 1643 03/28/24 2043 03/29/24 0759  GLUCAP 187* 209* 190* 170* 105*     No results found for this or any previous visit (from the past 240 hours).       Radiology Studies: No results found.      Scheduled Meds:  ascorbic acid   500 mg Oral Daily   busPIRone   10 mg  Oral BID   famotidine   20 mg Oral BID   feeding supplement (GLUCERNA SHAKE)  1 Container Oral TID WC   ferrous sulfate   325 mg Oral Q breakfast   insulin  aspart  0-5 Units Subcutaneous QHS   insulin  aspart  0-6 Units Subcutaneous TID WC   insulin  glargine-yfgn  10 Units Subcutaneous Daily   levETIRAcetam   500 mg Oral BID   lipase/protease/amylase  12,000 Units Oral TID AC   midodrine   10 mg Oral TID WC   multivitamin with minerals  1 tablet Oral Daily   rosuvastatin   5 mg Oral QHS   sertraline   50 mg Oral Daily   sodium chloride  flush  3 mL Intravenous Q12H   sodium chloride  flush  3 mL Intravenous Q12H   tamsulosin   0.4 mg Oral QPC supper   traZODone   50 mg Oral QHS   Continuous Infusions:  meropenem  (MERREM ) IV 1 g (03/29/24 0551)     LOS: 1 day    Time spent: 35 minutes    Toribio Hummer, MD [facility]   To contact the attending provider between 7A-7P or the covering  provider during after hours 7P-7A, please log into the web site [email or web address] and access using universal [place] password for that web site. If you do not have the password, please call the hospital operator.  03/29/2024, 11:33 AM

## 2024-03-29 NOTE — Consult Note (Addendum)
 WOC Nurse Consult Note: Consult requested for wounds.  Assessed patient's bilat buttocks/sacrum/hips/heels with nurse tech and did not find any open wounds.  No further role for WOC team. Please re-consult if further assistance is needed.  Thank-you,  Stephane Fought MSN, RN, CWOCN, [place], CNS [phone]

## 2024-03-30 ENCOUNTER — Other Ambulatory Visit: Payer: Self-pay

## 2024-03-30 DIAGNOSIS — E119 Type 2 diabetes mellitus without complications: Secondary | ICD-10-CM | POA: Diagnosis not present

## 2024-03-30 DIAGNOSIS — Z8673 Personal history of transient ischemic attack (TIA), and cerebral infarction without residual deficits: Secondary | ICD-10-CM | POA: Diagnosis not present

## 2024-03-30 DIAGNOSIS — I10 Essential (primary) hypertension: Secondary | ICD-10-CM | POA: Diagnosis not present

## 2024-03-30 DIAGNOSIS — N3001 Acute cystitis with hematuria: Secondary | ICD-10-CM | POA: Diagnosis not present

## 2024-03-30 LAB — COMPREHENSIVE METABOLIC PANEL WITH GFR
ALT: 14 U/L (ref 0–44)
AST: 24 U/L (ref 15–41)
Albumin: 2.5 g/dL — ABNORMAL LOW (ref 3.5–5.0)
Alkaline Phosphatase: 129 U/L — ABNORMAL HIGH (ref 38–126)
Anion gap: 9 (ref 5–15)
BUN: 20 mg/dL (ref 6–20)
CO2: 26 mmol/L (ref 22–32)
Calcium: 8.4 mg/dL — ABNORMAL LOW (ref 8.9–10.3)
Chloride: 96 mmol/L — ABNORMAL LOW (ref 98–111)
Creatinine, Ser: 0.58 mg/dL — ABNORMAL LOW (ref 0.61–1.24)
GFR, Estimated: >60 mL/min (ref >60)
Glucose, Bld: 389 mg/dL — ABNORMAL HIGH (ref 70–99)
Potassium: 5 mmol/L (ref 3.5–5.1)
Sodium: 131 mmol/L — ABNORMAL LOW (ref 135–145)
Total Bilirubin: 0.3 mg/dL (ref 0.0–1.2)
Total Protein: 6.3 g/dL — ABNORMAL LOW (ref 6.5–8.1)

## 2024-03-30 LAB — CBC
HCT: 30.6 % — ABNORMAL LOW (ref 39.0–52.0)
Hemoglobin: 9.7 g/dL — ABNORMAL LOW (ref 13.0–17.0)
MCH: 28.5 pg (ref 26.0–34.0)
MCHC: 31.7 g/dL (ref 30.0–36.0)
MCV: 90 fL (ref 80.0–100.0)
Platelets: 322 K/uL (ref 150–400)
RBC: 3.4 MIL/uL — ABNORMAL LOW (ref 4.22–5.81)
RDW: 15.9 % — ABNORMAL HIGH (ref 11.5–15.5)
WBC: 5.3 K/uL (ref 4.0–10.5)
nRBC: 0 % (ref 0.0–0.2)

## 2024-03-30 LAB — URINE CULTURE: Culture: >=100000 — AB

## 2024-03-30 LAB — GLUCOSE, CAPILLARY
Glucose-Capillary: 170 mg/dL — ABNORMAL HIGH (ref 70–99)
Glucose-Capillary: 280 mg/dL — ABNORMAL HIGH (ref 70–99)
Glucose-Capillary: 153 mg/dL — ABNORMAL HIGH (ref 70–99)
Glucose-Capillary: 381 mg/dL — ABNORMAL HIGH (ref 70–99)

## 2024-03-30 MED ORDER — SODIUM CHLORIDE 0.9 % IV SOLN
1.0000 g | INTRAVENOUS | Status: DC
  Administered 2024-03-30 – 2024-03-31 (×2): 1 g via INTRAVENOUS
  Filled 2024-03-30 (×2): qty 1000

## 2024-03-30 NOTE — Plan of Care (Signed)

## 2024-03-30 NOTE — Plan of Care (Signed)
  Problem: Skin Integrity: Goal: Risk for impaired skin integrity will decrease Outcome: Progressing   Problem: Education: Goal: Knowledge of General Education information will improve Description: Including pain rating scale, medication(s)/side effects and non-pharmacologic comfort measures Outcome: Progressing   Problem: Activity: Goal: Risk for activity intolerance will decrease Outcome: Progressing   Problem: Coping: Goal: Level of anxiety will decrease Outcome: Progressing   Problem: Pain Managment: Goal: General experience of comfort will improve and/or be controlled Outcome: Progressing   Problem: Safety: Goal: Ability to remain free from injury will improve Outcome: Progressing

## 2024-03-30 NOTE — Progress Notes (Addendum)
 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  Patient noted to be wheelchair-bound at baseline.  Urinalysis done concerning for UTI.  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  Patient placed empirically on IV Merrem .  CT pelvis pending.   Assessment & Plan:   Principal Problem:   Acute cystitis Active Problems:   History of CVA (cerebrovascular accident)   Hyperlipidemia   Essential hypertension   Normocytic anemia   Insulin  dependent type 2 diabetes mellitus (HCC)   H/O supraventricular tachycardia   Hematuria   History of seizure   Chronic hypotension   Pancreatic insufficiency   Generalized anxiety disorder   Acute cystitis with hematuria   #1 acute cystitis with hematuria secondary to ESBL Klebsiella pneumonia - Patient presented to the ED from nursing home due to complaints of hematuria. -Urinalysis done concerning for UTI and hematuria. - Patient with prior history of ESBL associated UTI with most recent UTI in April 2025 with urine cultures growing E. coli and Klebsiella which was multidrug-resistant and only sensitive to Merrem  and Zosyn. - It is noted per admitting physician that on chart review  from April 2025 patient did have emphysematous cystitis per CT scan which showed gas in the urinary bladder wall and gas in the urinary bladder lumen and mild extravesicular extension of gas along both pelvic sidewalls and along the right space of Retzius.  No well-defined drainable pelvic abscess noted at that time - During prior hospitalization patient seen by urology cystoscopy offered however patient declined and eventually patient was scheduled for outpatient cystoscopy. -CT pelvis ordered however patient noted to be refusing. -Urine cultures with > 100,000 colonies of ESBL Klebsiella pneumoniae. -Patient currently on IV Merrem . -Will change IV Merrem  to IV Invanz  for once daily dosing. -Will need 7 days of IV antibiotics and discussion with ID. -Order placed for PICC line. - Supportive care.  2.  History of CVA with hemiplegia-wheelchair-bound -Continue to hold Plavix  secondary to problem #1. - Could likely resume Plavix  in 2 days if hematuria still resolved.  3.  History of CAD -Plavix  on hold in the setting of hematuria. - Once hematuria resolved May resume Plavix . - Continue Crestor .  4.  History of chronic hypotension - Midodrine  10 mg 3 times daily.   5.  Normocytic anemia -Hemoglobin currently stable at 10.8 (03/28/2024).  6.  Insulin -dependent type 2 diabetes -Hemoglobin A1c 9.4 (01/07/2024). - CBG noted at 170 this morning.  - Continue home regimen Semglee  10 units daily, SSI.  7.  History of SVT -Follow.  8.  History of seizures -Continue home regimen Keppra  twice daily.  9.  Generalized anxiety disorder - Continue Zoloft , BuSpar .    10.  BPH - Continue Flomax .   11.  Insomnia -Continue home regimen trazodone .  12.  Chronic pancreatitis with  pancreatic insufficiency - Continue Creon  3 times daily with meals.   13.  History of epidural abscess with T10-T11 and L5-S1 cord compression and cauda equina syndrome and April 2025 -Status post completion of 2  months of antibiotics. - It is noted that patient was evaluated by neurosurgery and per neurosurgery no intervention offered as it would not change the outcome.  Patient was subsequently discharged to SNF. - PT/ OT. - Patient likely back to SNF.  14.  Hyperlipidemia - Continue statin.  15.  Multiple pressure wounds ruled out    DVT prophylaxis: SCDs Code Status: Full Family Communication: Updated patient.  No family at bedside. Disposition: Likely back to nursing facility when clinically improved and medically stable and once PICC line is placed hopefully in the next 24 hours..  Status is: Inpatient Remains inpatient appropriate because: Severity of illness   Consultants:  Curb sided ID: Dr. Luiz  Procedures:  CT pelvis pending 03/28/2024--patient refused.   Antimicrobials:  Anti-infectives (From admission, onward)    Start     Dose/Rate Route Frequency Ordered Stop   03/30/24 2200  ertapenem  (INVANZ ) 1 g in sodium chloride  0.9 % 100 mL IVPB        1 g 200 mL/hr over 30 Minutes Intravenous Every 24 hours 03/30/24 1420 04/06/24 2159   03/28/24 0600  meropenem  (MERREM ) 1 g in sodium chloride  0.9 % 100 mL IVPB  Status:  Discontinued        1 g 200 mL/hr over 30 Minutes Intravenous Every 8 hours 03/28/24 0451 03/30/24 1420         Subjective: Patient lying in bed awake and alert.  Denies any chest pain no shortness of breath.  Denies any abdominal pain.  Overall feels well.  Objective: Vitals:   03/29/24 1147 03/29/24 1935 03/30/24 0548 03/30/24 0806  BP: 135/65 95/67 115/68 91/60  Pulse: (!) 59 (!) 59 (!) 58 65  Resp: 17 15 15    Temp: 98.2 F (36.8 C) 97.8 F (36.6 C) 98.1 F (36.7 C)   TempSrc:      SpO2: 100% 98% 100%   Weight:      Height:        Intake/Output Summary (Last 24 hours) at 03/30/2024 1512 Last data filed at 03/30/2024 1200 Gross per 24 hour  Intake 1400 ml  Output 2150 ml  Net -750 ml   Filed Weights   03/28/24 0645  Weight: 64 kg     Examination:  General exam: NAD.  Cachectic.  Chronically ill-appearing.  Respiratory system: CTAB.  No wheezes, no crackles, no rhonchi.  Fair air movement.  Speaking in full sentences.  Normal respiratory effort.  Cardiovascular system: RRR no murmurs rubs or gallops.  No JVD.  No lower extremity edema.  Gastrointestinal system: Abdomen is soft, nontender, nondistended, positive bowel sounds.  No rebound.  No guarding.  Central nervous system: Alert and oriented. No focal neurological deficits. Extremities: No clubbing cyanosis or edema. Skin: No rashes, lesions or ulcers Psychiatry: Judgement and insight appear fair. Mood & affect appropriate.     Data Reviewed: I have personally reviewed following labs and imaging studies  CBC: Recent Labs  Lab 03/28/24 0145  WBC 7.4  NEUTROABS 5.2  HGB 10.8*  HCT 34.2*  MCV 92.4  PLT 315    Basic Metabolic Panel: Recent Labs  Lab 03/28/24 0145  NA 138  K 4.0  CL 102  CO2 27  GLUCOSE 83  BUN 14  CREATININE 0.53*  CALCIUM   8.8*    GFR: Estimated Creatinine Clearance: 90 mL/min (A) (by C-G formula based on SCr of 0.53 mg/dL (L)).  Liver Function Tests: No results for input(s): AST, ALT, ALKPHOS, BILITOT, PROT, ALBUMIN  in the last 168 hours.  CBG: Recent Labs  Lab 03/29/24 1142 03/29/24 1630 03/29/24 2045 03/30/24 0737 03/30/24 1121  GLUCAP 262* 160* 180* 170* 280*     Recent Results (from the past 240 hours)  Urine Culture (for pregnant, neutropenic or urologic patients or patients with an indwelling urinary catheter)     Status: Abnormal   Collection Time: 03/28/24 10:16 AM   Specimen: Urine, Catheterized  Result Value Ref Range Status   Specimen Description   Final    URINE, CATHETERIZED Performed at [facility], [address] [place], [place] [address]    Special Requests   Final    NONE Performed at [facility], [address] [place], [place]  [address]    Culture (A)  Final    >=100,000 COLONIES/mL KLEBSIELLA PNEUMONIAE Confirmed Extended Spectrum Beta-Lactamase Producer (ESBL).  In bloodstream infections from ESBL organisms, carbapenems are preferred over piperacillin/tazobactam. They are shown to have a lower risk of mortality.    Report Status 03/30/2024 FINAL  Final   Organism ID, Bacteria KLEBSIELLA PNEUMONIAE (A)  Final      Susceptibility   Klebsiella pneumoniae - MIC*    AMPICILLIN >=32 RESISTANT Resistant     CEFAZOLIN  >=64 RESISTANT Resistant     CEFEPIME  2 SENSITIVE Sensitive     CEFTRIAXONE  >=64 RESISTANT Resistant     CIPROFLOXACIN  1 RESISTANT Resistant     GENTAMICIN >=16 RESISTANT Resistant     IMIPENEM 0.5 SENSITIVE Sensitive     NITROFURANTOIN 128 RESISTANT Resistant     TRIMETH/SULFA >=320 RESISTANT Resistant     AMPICILLIN/SULBACTAM >=32 RESISTANT Resistant     PIP/TAZO 8 SENSITIVE Sensitive ug/mL    * >=100,000 COLONIES/mL KLEBSIELLA PNEUMONIAE         Radiology Studies: US  EKG SITE RITE Result Date: 03/30/2024 If Site Rite image not attached, placement could not be confirmed due to current cardiac rhythm.       Scheduled Meds:  ascorbic acid   500 mg Oral Daily   busPIRone   10 mg Oral BID   famotidine   20 mg Oral BID   feeding supplement (GLUCERNA SHAKE)  1 Container Oral TID WC   ferrous sulfate   325 mg Oral Q breakfast   insulin  aspart  0-5 Units Subcutaneous QHS   insulin  aspart  0-6 Units Subcutaneous TID WC   insulin  glargine-yfgn  10 Units Subcutaneous Daily   levETIRAcetam   500 mg Oral BID   lipase/protease/amylase  12,000 Units Oral TID AC   midodrine   10 mg Oral TID WC   multivitamin with minerals  1 tablet Oral Daily   rosuvastatin   5 mg Oral QHS   sertraline   50 mg Oral Daily   sodium chloride  flush  3 mL Intravenous Q12H   sodium chloride  flush  3 mL Intravenous Q12H   tamsulosin   0.4 mg Oral QPC supper   traZODone   50 mg Oral QHS   Continuous Infusions:  ertapenem         LOS: 2 days    Time spent: 35 minutes    Toribio Hummer, MD [facility]   To contact the attending provider between 7A-7P or the covering provider during after hours 7P-7A, please log into the web site [email or web address] and access using universal [place] password  for that web site. If you do not have the password, please call the hospital operator.  03/30/2024, 3:12 PM

## 2024-03-31 DIAGNOSIS — N3001 Acute cystitis with hematuria: Secondary | ICD-10-CM | POA: Diagnosis not present

## 2024-03-31 LAB — GLUCOSE, CAPILLARY
Glucose-Capillary: 178 mg/dL — ABNORMAL HIGH (ref 70–99)
Glucose-Capillary: 263 mg/dL — ABNORMAL HIGH (ref 70–99)
Glucose-Capillary: 194 mg/dL — ABNORMAL HIGH (ref 70–99)
Glucose-Capillary: 316 mg/dL — ABNORMAL HIGH (ref 70–99)

## 2024-03-31 MED ORDER — MUPIROCIN 2 % EX OINT: 1.0000 | TOPICAL_OINTMENT | Freq: Every day | CUTANEOUS | Status: DC

## 2024-03-31 MED ORDER — SODIUM CHLORIDE 0.9 % IV SOLN: 1.0000 g | INTRAVENOUS | 0 refills | Status: AC

## 2024-03-31 MED ORDER — SODIUM CHLORIDE 0.9% FLUSH: 10.0000 mL | INTRAVENOUS | Status: DC | PRN

## 2024-03-31 MED ORDER — INSULIN ASPART 100 UNIT/ML IJ SOLN
0.0000 [IU] | Freq: Three times a day (TID) | INTRAMUSCULAR | Status: DC
  Administered 2024-03-31: 1 [IU] via SUBCUTANEOUS
  Administered 2024-04-01: 2 [IU] via SUBCUTANEOUS

## 2024-03-31 MED ORDER — SODIUM CHLORIDE 0.9% FLUSH
10.0000 mL | Freq: Two times a day (BID) | INTRAVENOUS | Status: DC
  Administered 2024-03-31 – 2024-04-01 (×2): 10 mL

## 2024-03-31 MED ORDER — CHLORHEXIDINE GLUCONATE CLOTH 2 % EX PADS
6.0000 | MEDICATED_PAD | Freq: Every day | CUTANEOUS | Status: DC
Start: 2024-03-31 — End: 2024-04-01
  Administered 2024-03-31 – 2024-04-01 (×2): 6 via TOPICAL

## 2024-03-31 MED ORDER — OXYCODONE HCL 5 MG PO TABS: 5.0000 mg | ORAL_TABLET | Freq: Four times a day (QID) | ORAL | 0 refills | Status: DC | PRN

## 2024-03-31 NOTE — Plan of Care (Signed)

## 2024-03-31 NOTE — Progress Notes (Signed)
 Peripherally Inserted Central Catheter Placement  The IV Nurse has discussed with the patient and/or persons authorized to consent for the patient, the purpose of this procedure and the potential benefits and risks involved with this procedure.  The benefits include less needle sticks, lab draws from the catheter, and the patient may be discharged home with the catheter. Risks include, but not limited to, infection, bleeding, blood clot (thrombus formation), and puncture of an artery; nerve damage and irregular heartbeat and possibility to perform a PICC exchange if needed/ordered by physician.  Alternatives to this procedure were also discussed.  Bard Power PICC patient education guide, fact sheet on infection prevention and patient information card has been provided to patient /or left at bedside.    PICC Placement Documentation  PICC Single Lumen 03/31/24 Right Brachial 37 cm 2 cm (Active)  Indication for Insertion or Continuance of Line Prolonged intravenous therapies 03/31/24 1549  Exposed Catheter (cm) 2 cm 03/31/24 1549  Site Assessment Clean, Dry, Intact 03/31/24 1549  Line Status Flushed;Saline locked;Blood return noted 03/31/24 1549  Dressing Type Transparent;Securing device 03/31/24 1549  Dressing Status Antimicrobial disc/dressing in place 03/31/24 1549  Line Care Connections checked and tightened 03/31/24 1549  Line Adjustment (NICU/IV Team Only) No 03/31/24 1549  Dressing Intervention New dressing;Adhesive placed at insertion site (IV team only) 03/31/24 1549  Dressing Change Due 04/07/24 03/31/24 1549       Brandon  Alda [name] 03/31/2024, 3:51 PM

## 2024-03-31 NOTE — Discharge Summary (Signed)
 Physician Discharge Summary  Brandon [name] FMW:[id number] DOB: 01/22/1966 DOA: 03/28/2024  PCP: Donal Channing SQUIBB, FNP  Admit date: 03/28/2024 Discharge date: 03/31/2024  Admitted From: SNF Disposition: SNF  Recommendations for Outpatient Follow-up:  Follow up with SNF provider at earliest convenience Recommend outpatient evaluation followed by palliative care for goals of care discussion Follow up in ED if symptoms worsen or new appear   Home Health: No Equipment/Devices: PICC line placement pending  Discharge Condition: Guarded CODE STATUS: Full Diet recommendation: Heart healthy  Brief/Interim Summary: 58 year old gentleman 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 Patient noted to be wheelchair-bound at baseline. Urinalysis done concerning for UTI. 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 Patient placed empirically on IV Merrem .  Urine culture grew ESBL Klebsiella pneumoniae: Antibiotics switched to IV Invanz  after discussion with ID by prior hospitalist: ID recommended 7-day course of total antibiotic therapy.  Currently hemodynamically stable.  He will be discharged to SNF once PICC line is placed today to complete IV antibiotic therapy.  Discharge Diagnoses:   Acute cystitis with hematuria secondary to ESBL Klebsiella pneumoniae -Patient placed empirically on IV Merrem .  Urine culture grew ESBL Klebsiella pneumoniae:  Antibiotics switched to IV Invanz  after discussion with ID by prior hospitalist: ID recommended 7-day course of total antibiotic therapy.  Currently hemodynamically stable.  He will be discharged to SNF once PICC line is placed today to complete IV antibiotic therapy. -Patient had refused cystoscopy during last hospitalization which urology had offered: Outpatient cystoscopy was scheduled -CT of pelvis ordered given his heart paralyzed; patient refused at  History of CVA, unspecified with hemiplegia/wheelchair-bound - Plavix  on hold for now for hematuria.  Resume on discharge  History of CAD Hyperlipidemia -Plavix  plan as above.  Continue statin  Chronic hypotension - Continue midodrine   History of seizures - Continue Keppra .  Outpatient follow-up with neurology  History of SVT -Currently stable.  Outpatient follow-up with cardiology  Hyponatremia -mild.  Outpatient follow-up  Diabetes mellitus type 2 with hyperglycemia - Continue long-acting insulin  along with sliding scale insulin  and CBGs with SSI  Anemia of chronic disease - From chronic illnesses.  Hemoglobin stable.  Monitor intermittently as an outpatient  BPH - Continue Flomax   Generalized anxiety disorder - Continue Zoloft  and BuSpar   Insomnia Continue home trazodone   Chronic pancreatitis with pancreatic insufficiency -Continue Creon .  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  It is noted that patient was evaluated by neurosurgery during the same admission and as per neurosurgery: No intervention was offered as it would not change the outcome  Discharge Instructions  Discharge Instructions     Diet - low sodium heart healthy   Complete by: As directed    Increase activity slowly   Complete by: As directed       Allergies as of 03/31/2024   No Known  Allergies      Medication List  STOP taking these medications    LORazepam  2 MG/ML concentrated solution Commonly known as: ATIVAN        TAKE these medications    acetaminophen  325 MG tablet Commonly known as: TYLENOL  Take 650 mg by mouth every 6 (six) hours as needed for mild pain (pain score 1-3) (cannot exceed a total of 3,000 mg/24 hours).   ascorbic acid  500 MG tablet Commonly known as: VITAMIN C  Take 500 mg by mouth daily.   busPIRone  10 MG tablet Commonly known as: BUSPAR  Take 1 tablet (10 mg total) by mouth 2 (two) times daily.   clopidogrel  75 MG tablet Commonly known as: PLAVIX  Take 1 tablet (75 mg total) by mouth daily.   ertapenem  1 g in sodium chloride  0.9 % 100 mL Inject 1 g into the vein daily for 3 days.   famotidine  20 MG tablet Commonly known as: PEPCID  Take 1 tablet (20 mg total) by mouth 2 (two) times daily.   feeding supplement (GLUCERNA SHAKE) Liqd Take 1 Container by mouth in the morning, at noon, and at bedtime.   FeroSul 325 (65 Fe) MG tablet Generic drug: ferrous sulfate  Take 1 tablet (325 mg total) by mouth daily.   HumaLOG KwikPen 100 UNIT/ML KwikPen Generic drug: insulin  lispro Inject 0-5 Units into the skin in the morning, at noon, and at bedtime. BS 70-130 inject 0 units  BS 131-180 inject 1 units  BS 181-240 inject 2 units  BS 241-300 inject 3 units  BS 301-350 inject 4 units  BS 351-400 inject 5 units   insulin  glargine 100 UNIT/ML injection Commonly known as: LANTUS  Inject 10 Units into the skin daily.   levETIRAcetam  500 MG tablet Commonly known as: KEPPRA  Take 500 mg by mouth 2 (two) times daily. What changed: Another medication with the same name was removed. Continue taking this medication, and follow the directions you see here.   lipase/protease/amylase 63999 UNITS Cpep capsule Commonly known as: CREON  Take 1 capsule (36,000 Units total) by mouth 3 (three) times daily with meals.   midodrine  10 MG  tablet Commonly known as: PROAMATINE  Take 1 tablet (10 mg total) by mouth 3 (three) times daily with meals. Please hold for systolic blood pressure more than 110.   multivitamin with minerals Tabs tablet Take 1 tablet by mouth daily.   mupirocin  ointment 2 % Commonly known as: BACTROBAN  Apply 1 Application topically daily.   nystatin cream Commonly known as: MYCOSTATIN Apply 1 Application topically every 4 (four) hours as needed for dry skin.   ondansetron  4 MG tablet Commonly known as: ZOFRAN  Take 4 mg by mouth every 6 (six) hours as needed for nausea or vomiting.   oxyCODONE  5 MG immediate release tablet Commonly known as: Oxy IR/ROXICODONE  Take 1 tablet (5 mg total) by mouth every 6 (six) hours as needed for severe pain (pain score 7-10). What changed: reasons to take this   polyethylene glycol 17 g packet Commonly known as: MIRALAX  / GLYCOLAX  Take 17 g by mouth daily as needed. What changed: when to take this   rosuvastatin  5 MG tablet Commonly known as: CRESTOR  Take 5 mg by mouth at bedtime.   senna 8.6 MG Tabs tablet Commonly known as: SENOKOT Take 2 tablets by mouth in the morning and at bedtime.   sertraline  50 MG tablet Commonly known as: ZOLOFT  Take 50 mg by mouth daily.   tamsulosin  0.4 MG Caps capsule Commonly known as: FLOMAX  Take 1 capsule (0.4 mg total) by mouth daily after supper.  traZODone  50 MG tablet Commonly known as: DESYREL  Take 1 tablet (50 mg total) by mouth at bedtime.   zinc oxide 20 % ointment Apply 1 Application topically in the morning and at bedtime.        No Known Allergies  Consultations: Curbside ID   Procedures/Studies: US  EKG SITE RITE Result Date: 03/30/2024 If Site Rite image not attached, placement could not be confirmed due to current cardiac rhythm.     Subjective: Patient seen and examined at bedside.  No fever, vomiting, seizures reported.  Discharge Exam: Vitals:   03/31/24 0521 03/31/24 1123  BP:  92/67 (!) 160/126  Pulse: 66 (!) 59  Resp: 18   Temp: 98.1 F (36.7 C) 98.7 F (37.1 C)  SpO2: 95% 100%    General: Pt is alert, awake, not in acute distress.  Chronically ill and deconditioned.  On room air.  Slow to respond. Cardiovascular: Mild intermittent bradycardia present, S1/S2 + Respiratory: bilateral decreased breath sounds at bases Abdominal: Soft, NT, ND, bowel sounds + Extremities: no edema, no cyanosis    The results of significant diagnostics from this hospitalization (including imaging, microbiology, ancillary and laboratory) are listed below for reference.     Microbiology: Recent Results (from the past 240 hours)  Urine Culture (for pregnant, neutropenic or urologic patients or patients with an indwelling urinary catheter)     Status: Abnormal   Collection Time: 03/28/24 10:16 AM   Specimen: Urine, Catheterized  Result Value Ref Range Status   Specimen Description   Final    URINE, CATHETERIZED Performed at [facility], [address] [place], [place] [address]    Special Requests   Final    NONE Performed at [facility], [address] [place], [place] [address]    Culture (A)  Final    >=100,000 COLONIES/mL KLEBSIELLA PNEUMONIAE Confirmed Extended Spectrum Beta-Lactamase Producer (ESBL).  In bloodstream infections from ESBL organisms, carbapenems are preferred over piperacillin/tazobactam. They are shown to have a lower risk of mortality.    Report Status 03/30/2024 FINAL  Final   Organism ID, Bacteria KLEBSIELLA PNEUMONIAE (A)  Final      Susceptibility   Klebsiella pneumoniae - MIC*    AMPICILLIN >=32 RESISTANT Resistant     CEFAZOLIN  >=64 RESISTANT Resistant     CEFEPIME  2 SENSITIVE Sensitive     CEFTRIAXONE  >=64 RESISTANT Resistant     CIPROFLOXACIN  1 RESISTANT Resistant     GENTAMICIN >=16 RESISTANT Resistant     IMIPENEM 0.5 SENSITIVE Sensitive     NITROFURANTOIN 128 RESISTANT Resistant      TRIMETH/SULFA >=320 RESISTANT Resistant     AMPICILLIN/SULBACTAM >=32 RESISTANT Resistant     PIP/TAZO 8 SENSITIVE Sensitive ug/mL    * >=100,000 COLONIES/mL KLEBSIELLA PNEUMONIAE     Labs: BNP (last 3 results) No results for input(s): BNP in the last 8760 hours. Basic Metabolic Panel: Recent Labs  Lab 03/28/24 0145 03/30/24 2158  NA 138 131*  K 4.0 5.0  CL 102 96*  CO2 27 26  GLUCOSE 83 389*  BUN 14 20  CREATININE 0.53* 0.58*  CALCIUM  8.8* 8.4*   Liver Function Tests: Recent Labs  Lab 03/30/24 2158  AST 24  ALT 14  ALKPHOS 129*  BILITOT 0.3  PROT 6.3*  ALBUMIN  2.5*   No results for input(s): LIPASE, AMYLASE in the last 168 hours. No results for input(s): AMMONIA in the last 168 hours. CBC: Recent Labs  Lab 03/28/24 0145 03/30/24 2158  WBC 7.4 5.3  NEUTROABS 5.2  --   HGB 10.8* 9.7*  HCT 34.2* 30.6*  MCV 92.4 90.0  PLT 315 322   Cardiac Enzymes: No results for input(s): CKTOTAL, CKMB, CKMBINDEX, TROPONINI in the last 168 hours. BNP: Invalid input(s): POCBNP CBG: Recent Labs  Lab 03/30/24 1121 03/30/24 1630 03/30/24 2104 03/31/24 0729 03/31/24 1125  GLUCAP 280* 153* 381* 178* 263*   D-Dimer No results for input(s): DDIMER in the last 72 hours. Hgb A1c No results for input(s): HGBA1C in the last 72 hours. Lipid Profile No results for input(s): CHOL, HDL, LDLCALC, TRIG, CHOLHDL, LDLDIRECT in the last 72 hours. Thyroid  function studies No results for input(s): TSH, T4TOTAL, T3FREE, THYROIDAB in the last 72 hours.  Invalid input(s): FREET3 Anemia work up No results for input(s): VITAMINB12, FOLATE, FERRITIN, TIBC, IRON, RETICCTPCT in the last 72 hours. Urinalysis    Component Value Date/Time   COLORURINE RED (A) 03/28/2024 0356   APPEARANCEUR CLOUDY (A) 03/28/2024 0356   LABSPEC 1.023 03/28/2024 0356   PHURINE 5.0 03/28/2024 0356   GLUCOSEU NEGATIVE 03/28/2024 0356   HGBUR LARGE (A)  03/28/2024 0356   BILIRUBINUR NEGATIVE 03/28/2024 0356   KETONESUR NEGATIVE 03/28/2024 0356   PROTEINUR 100 (A) 03/28/2024 0356   UROBILINOGEN 0.2 02/16/2014 1742   NITRITE NEGATIVE 03/28/2024 0356   LEUKOCYTESUR SMALL (A) 03/28/2024 0356   Sepsis Labs Recent Labs  Lab 03/28/24 0145 03/30/24 2158  WBC 7.4 5.3   Microbiology Recent Results (from the past 240 hours)  Urine Culture (for pregnant, neutropenic or urologic patients or patients with an indwelling urinary catheter)     Status: Abnormal   Collection Time: 03/28/24 10:16 AM   Specimen: Urine, Catheterized  Result Value Ref Range Status   Specimen Description   Final    URINE, CATHETERIZED Performed at [facility], [address], [place], [place] [address]    Special Requests   Final    NONE Performed at [facility], [address], [place], [place] [address]    Culture (A)  Final    >=100,000 COLONIES/mL KLEBSIELLA PNEUMONIAE Confirmed Extended Spectrum Beta-Lactamase Producer (ESBL).  In bloodstream infections from ESBL organisms, carbapenems are preferred over piperacillin/tazobactam. They are shown to have a lower risk of mortality.    Report Status 03/30/2024 FINAL  Final   Organism ID, Bacteria KLEBSIELLA PNEUMONIAE (A)  Final      Susceptibility   Klebsiella pneumoniae - MIC*    AMPICILLIN >=32 RESISTANT Resistant     CEFAZOLIN  >=64 RESISTANT Resistant     CEFEPIME  2 SENSITIVE Sensitive     CEFTRIAXONE  >=64 RESISTANT Resistant     CIPROFLOXACIN  1 RESISTANT Resistant     GENTAMICIN >=16 RESISTANT Resistant     IMIPENEM 0.5 SENSITIVE Sensitive     NITROFURANTOIN 128 RESISTANT Resistant     TRIMETH/SULFA >=320 RESISTANT Resistant     AMPICILLIN/SULBACTAM >=32 RESISTANT Resistant     PIP/TAZO 8 SENSITIVE Sensitive ug/mL    * >=100,000 COLONIES/mL KLEBSIELLA PNEUMONIAE     Time coordinating discharge: 35 minutes  SIGNED:   Sophie Mao, MD  [facility] 03/31/2024, 1:43 PM

## 2024-04-01 LAB — GLUCOSE, CAPILLARY: Glucose-Capillary: 211 mg/dL — ABNORMAL HIGH (ref 70–99)

## 2024-04-01 NOTE — TOC Transition Note (Addendum)
 Transition of Care [facility]) - Discharge Note   Patient Details  Name: Brandon [name] MRN: [id number] Date of Birth: 1965-10-19  Transition of Care [facility]) CM/SW Contact:  Sonda Manuella Quill, RN Phone Number: 04/01/2024, 9:56 AM   Clinical Narrative:    D/C orders received; pt from [facility]; transport by [facility]; spoke w/ Donnamarie in Admissions; he gave RM # [id number], call report # [phone], ask for 1st floor nursing station; pt notified and agreed to d/c plan; LVM for is sister [name] ([phone]); PTAR called at [phone]; spoke w/ Dick; no TOC needs.  -1038- return call from pt's sister [name]; she was notified pt will d/c back to [facility] w/ transport by [facility]; she agreed to d/c plans; no TOC needs.  Final next level of care: Long Term Nursing Home Barriers to Discharge: No Barriers Identified   Patient Goals and CMS Choice Patient states their goals for this hospitalization and ongoing recovery are:: return to [facility] for LTC   Choice offered to / list presented to : NA      Discharge Placement              Patient chooses bed at: Other - please specify in the comment section below: [facility]) Patient to be transferred to facility by: PTAR Name of family member notified: LVM Ki Justice (sister) [phone] Patient and family notified of of transfer: 04/01/24  Discharge Plan and Services Additional resources added to the After Visit Summary for   In-house Referral: NA Discharge Planning Services: NA Post Acute Care Choice: Skilled Nursing Facility          DME Arranged: N/A DME Agency: NA       HH Arranged: NA HH Agency: NA        Social Drivers of Health (SDOH) Interventions SDOH Screenings   Food Insecurity: No Food Insecurity (03/28/2024)  Housing: Low Risk  (03/28/2024)  Transportation Needs: No Transportation Needs (03/28/2024)  Utilities: Not At Risk (03/28/2024)  Physical Activity: Inactive (11/15/2021)  Stress: No  Stress Concern Present (10/31/2021)  Tobacco Use: High Risk (03/28/2024)     Readmission Risk Interventions    01/08/2024   11:12 AM  Readmission Risk Prevention Plan  Transportation Screening Complete  Medication Review (RN Care Manager) Complete  PCP or Specialist appointment within 3-5 days of discharge Complete  HRI or Home Care Consult Complete  SW Recovery Care/Counseling Consult Complete  Palliative Care Screening Not Applicable  Skilled Nursing Facility Complete

## 2024-04-01 NOTE — Progress Notes (Signed)
 Patient is waiting to be discharged to SNF.  He is currently medically stable for discharge.  Patient seen and examined at bedside today.  Please refer to the full discharge summary done by me on 03/31/2024 for full details.

## 2024-04-01 NOTE — TOC Initial Note (Signed)
 Transition of Care [facility]) - Initial/Assessment Note    Patient Details  Name: Brandon [name] MRN: [id number] Date of Birth: 02/14/1966  Transition of Care [facility]) CM/SW Contact:    Sheri ONEIDA Sharps, LCSW Phone Number: 04/01/2024, 9:02 AM  Clinical Narrative:                 Pt from [facility]. Pt continues medical workup. No FL2 needed. CSW confirmed w/ facility that pt is able to return once medically ready. Pt will require PT[facility] for transport. TOC following for additional dc needs.   Expected Discharge Plan: Long Term Nursing Home Barriers to Discharge: Barriers Resolved   Patient Goals and CMS Choice Patient states their goals for this hospitalization and ongoing recovery are:: return to [facility] for LTC   Choice offered to / list presented to : NA      Expected Discharge Plan and Services In-house Referral: NA Discharge Planning Services: NA Post Acute Care Choice: Skilled Nursing Facility Living arrangements for the past 2 months: Skilled Nursing Facility Expected Discharge Date: 03/31/24               DME Arranged: N/A DME Agency: NA       HH Arranged: NA HH Agency: NA        Prior Living Arrangements/Services Living arrangements for the past 2 months: Skilled Nursing Facility Lives with:: Facility Resident Patient language and need for interpreter reviewed:: Yes Do you feel safe going back to the place where you live?: Yes      Need for Family Participation in Patient Care: Yes (Comment) Care giver support system in place?: Yes (comment)   Criminal Activity/Legal Involvement Pertinent to Current Situation/Hospitalization: No - Comment as needed  Activities of Daily Living   ADL Screening (condition at time of admission) Independently performs ADLs?: No Does the patient have a NEW difficulty with bathing/dressing/toileting/self-feeding that is expected to last >3 days?: No Does the patient have a NEW difficulty with getting in/out of bed,  walking, or climbing stairs that is expected to last >3 days?: No Does the patient have a NEW difficulty with communication that is expected to last >3 days?: No Is the patient deaf or have difficulty hearing?: No Does the patient have difficulty seeing, even when wearing glasses/contacts?: No Does the patient have difficulty concentrating, remembering, or making decisions?: No  Permission Sought/Granted                  Emotional Assessment Appearance:: Appears stated age Attitude/Demeanor/Rapport: Engaged Affect (typically observed): Accepting Orientation: : Oriented to Self, Oriented to Place, Oriented to  Time, Oriented to Situation Alcohol  / Substance Use: Not Applicable Psych Involvement: No (comment)  Admission diagnosis:  Acute cystitis with hematuria [N30.01] Acute cystitis without hematuria [N30.00] Patient Active Problem List   Diagnosis Date Noted   H/O supraventricular tachycardia 03/28/2024   Acute cystitis 03/28/2024   Hematuria 03/28/2024   History of seizure 03/28/2024   Chronic hypotension 03/28/2024   Pancreatic insufficiency 03/28/2024   Generalized anxiety disorder 03/28/2024   Acute cystitis with hematuria 03/28/2024   Flank pain 01/08/2024   Pneumococcal bacteremia 01/08/2024   Pneumaturia 01/08/2024   Sepsis (HCC) 01/07/2024   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   SVT (supraventricular tachycardia) (HCC) 01/07/2024   Infection due to ESBL-producing Klebsiella pneumoniae 01/07/2024   Gram-negative bacteremia 01/07/2024   Hyperglycemia 01/07/2024   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   Abnormal findings on diagnostic imaging of spine 01/06/2024   Acute pyelonephritis 01/06/2024  Fall 08/01/2023   ICH (intracerebral hemorrhage) (HCC) 07/30/2023   Pain in left shoulder 03/25/2022   Pressure injury of skin 01/08/2022   Frequent falls 01/08/2022   DKA (diabetic ketoacidosis) (HCC) 01/07/2022   Rhabdomyolysis 01/07/2022   Protein-calorie malnutrition,  severe 12/23/2020   Hyperosmolar hyperglycemic state (HHS) (HCC) 12/22/2020   Uncontrolled type 2 diabetes mellitus with hyperglycemia, with long-term current use of insulin  (HCC) 12/21/2020   Hyperglycemia due to diabetes mellitus (HCC) 12/21/2020   Empyema lung (HCC)    Epidural abscess    Diskitis 02/06/2020   Alcohol  withdrawal delirium (HCC) 02/06/2020   Epilepsy (HCC) 02/06/2020   Pleural effusion on right 02/06/2020   History of CVA (cerebrovascular accident) 01/23/2017   Dry eyes    Sleep disturbance    Essential hypertension, benign    Upper GI bleed    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   Fatty liver    Tobacco abuse    Insulin  dependent type 2 diabetes mellitus (HCC)    History of CVA with residual deficit    Acute lower UTI    Wide-complex tachycardia    Chronic alcoholic pancreatitis (HCC)    Hyponatremia 03/09/2016   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   Acute blood loss anemia 03/09/2016   Gastric varices    Alcohol  abuse    Left-sided neglect    Normocytic anemia    UGIB (upper gastrointestinal bleed)    Pressure ulcer 03/06/2016   Acute encephalopathy    Cerebral infarction (HCC) 03/05/2016   Carotid stenosis 04/06/2015   CAD in native artery 03/16/2015   Unstable angina pectoris (HCC) 03/15/2015   Neck pain 10/20/2013   Insomnia 12/29/2012   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 02/25/2011   Hepatic steatosis 02/25/2011   PCP:  Donal Channing SQUIBB, FNP Pharmacy:   [facility] [address] [place] [address] Phone: [phone] Fax: [phone]     Social Drivers of Health (SDOH) Social History: SDOH Screenings   Food Insecurity: No Food Insecurity (03/28/2024)  Housing: Low Risk  (03/28/2024)  Transportation Needs: No Transportation Needs (03/28/2024)  Utilities: Not At Risk (03/28/2024)  Physical Activity: Inactive (11/15/2021)  Stress: No Stress Concern Present (10/31/2021)  Tobacco Use: High Risk (03/28/2024)   SDOH Interventions: Food Insecurity Interventions: Intervention Not Indicated Housing Interventions: Intervention Not Indicated, Inpatient TOC Transportation Interventions: Intervention Not Indicated Utilities Interventions: Intervention Not Indicated   Readmission Risk Interventions    01/08/2024   11:12 AM  Readmission Risk Prevention Plan  Transportation Screening Complete  Medication Review (RN Care Manager) Complete  PCP or Specialist appointment within 3-5 days of discharge Complete  HRI or Home Care Consult Complete  SW Recovery Care/Counseling Consult Complete  Palliative Care Screening Not Applicable  Skilled Nursing Facility Complete

## 2024-06-23 ENCOUNTER — Inpatient Hospital Stay (HOSPITAL_COMMUNITY)
Admission: EM | Admit: 2024-06-23 | Discharge: 2024-07-07 | DRG: 637 | Disposition: A | Discharge status: Skilled Nursing Facility | Source: Skilled Nursing Facility | Attending: Internal Medicine | Admitting: Internal Medicine

## 2024-06-23 ENCOUNTER — Other Ambulatory Visit: Payer: Self-pay

## 2024-06-23 ENCOUNTER — Emergency Department (HOSPITAL_COMMUNITY)

## 2024-06-23 DIAGNOSIS — I2510 Atherosclerotic heart disease of native coronary artery without angina pectoris: Secondary | ICD-10-CM | POA: Diagnosis not present

## 2024-06-23 DIAGNOSIS — M869 Osteomyelitis, unspecified: Secondary | ICD-10-CM | POA: Diagnosis not present

## 2024-06-23 DIAGNOSIS — R627 Adult failure to thrive: Secondary | ICD-10-CM | POA: Diagnosis present

## 2024-06-23 DIAGNOSIS — G834 Cauda equina syndrome: Secondary | ICD-10-CM | POA: Diagnosis present

## 2024-06-23 DIAGNOSIS — Z681 Body mass index (BMI) 19 or less, adult: Secondary | ICD-10-CM | POA: Diagnosis not present

## 2024-06-23 DIAGNOSIS — E11621 Type 2 diabetes mellitus with foot ulcer: Principal | ICD-10-CM | POA: Diagnosis present

## 2024-06-23 DIAGNOSIS — F03C18 Unspecified dementia, severe, with other behavioral disturbance: Secondary | ICD-10-CM | POA: Diagnosis present

## 2024-06-23 DIAGNOSIS — I6930 Unspecified sequelae of cerebral infarction: Secondary | ICD-10-CM

## 2024-06-23 DIAGNOSIS — D638 Anemia in other chronic diseases classified elsewhere: Secondary | ICD-10-CM | POA: Diagnosis present

## 2024-06-23 DIAGNOSIS — N400 Enlarged prostate without lower urinary tract symptoms: Secondary | ICD-10-CM | POA: Diagnosis present

## 2024-06-23 DIAGNOSIS — F03C3 Unspecified dementia, severe, with mood disturbance: Secondary | ICD-10-CM | POA: Diagnosis present

## 2024-06-23 DIAGNOSIS — Z794 Long term (current) use of insulin: Secondary | ICD-10-CM

## 2024-06-23 DIAGNOSIS — I9589 Other hypotension: Secondary | ICD-10-CM | POA: Diagnosis present

## 2024-06-23 DIAGNOSIS — Z823 Family history of stroke: Secondary | ICD-10-CM

## 2024-06-23 DIAGNOSIS — Z7902 Long term (current) use of antithrombotics/antiplatelets: Secondary | ICD-10-CM

## 2024-06-23 DIAGNOSIS — Z811 Family history of alcohol abuse and dependence: Secondary | ICD-10-CM

## 2024-06-23 DIAGNOSIS — Z8419 Family history of other disorders of kidney and ureter: Secondary | ICD-10-CM

## 2024-06-23 DIAGNOSIS — E785 Hyperlipidemia, unspecified: Secondary | ICD-10-CM | POA: Diagnosis present

## 2024-06-23 DIAGNOSIS — G4700 Insomnia, unspecified: Secondary | ICD-10-CM | POA: Diagnosis present

## 2024-06-23 DIAGNOSIS — I701 Atherosclerosis of renal artery: Secondary | ICD-10-CM | POA: Diagnosis present

## 2024-06-23 DIAGNOSIS — Z66 Do not resuscitate: Secondary | ICD-10-CM | POA: Diagnosis not present

## 2024-06-23 DIAGNOSIS — Z7189 Other specified counseling: Secondary | ICD-10-CM | POA: Diagnosis not present

## 2024-06-23 DIAGNOSIS — E43 Unspecified severe protein-calorie malnutrition: Secondary | ICD-10-CM | POA: Diagnosis present

## 2024-06-23 DIAGNOSIS — R0989 Other specified symptoms and signs involving the circulatory and respiratory systems: Secondary | ICD-10-CM

## 2024-06-23 DIAGNOSIS — F17210 Nicotine dependence, cigarettes, uncomplicated: Secondary | ICD-10-CM | POA: Diagnosis present

## 2024-06-23 DIAGNOSIS — L89620 Pressure ulcer of left heel, unstageable: Secondary | ICD-10-CM | POA: Diagnosis present

## 2024-06-23 DIAGNOSIS — E1165 Type 2 diabetes mellitus with hyperglycemia: Secondary | ICD-10-CM | POA: Diagnosis present

## 2024-06-23 DIAGNOSIS — R64 Cachexia: Secondary | ICD-10-CM | POA: Diagnosis present

## 2024-06-23 DIAGNOSIS — Z7984 Long term (current) use of oral hypoglycemic drugs: Secondary | ICD-10-CM

## 2024-06-23 DIAGNOSIS — Z89612 Acquired absence of left leg above knee: Secondary | ICD-10-CM

## 2024-06-23 DIAGNOSIS — L97429 Non-pressure chronic ulcer of left heel and midfoot with unspecified severity: Secondary | ICD-10-CM | POA: Diagnosis not present

## 2024-06-23 DIAGNOSIS — Z9889 Other specified postprocedural states: Secondary | ICD-10-CM | POA: Diagnosis not present

## 2024-06-23 DIAGNOSIS — L97422 Non-pressure chronic ulcer of left heel and midfoot with fat layer exposed: Secondary | ICD-10-CM

## 2024-06-23 DIAGNOSIS — E1169 Type 2 diabetes mellitus with other specified complication: Principal | ICD-10-CM | POA: Diagnosis present

## 2024-06-23 DIAGNOSIS — Z7401 Bed confinement status: Secondary | ICD-10-CM

## 2024-06-23 DIAGNOSIS — M86172 Other acute osteomyelitis, left ankle and foot: Secondary | ICD-10-CM | POA: Diagnosis present

## 2024-06-23 DIAGNOSIS — I69354 Hemiplegia and hemiparesis following cerebral infarction affecting left non-dominant side: Secondary | ICD-10-CM

## 2024-06-23 DIAGNOSIS — L89890 Pressure ulcer of other site, unstageable: Secondary | ICD-10-CM | POA: Diagnosis not present

## 2024-06-23 DIAGNOSIS — D72829 Elevated white blood cell count, unspecified: Secondary | ICD-10-CM | POA: Diagnosis present

## 2024-06-23 DIAGNOSIS — Z8673 Personal history of transient ischemic attack (TIA), and cerebral infarction without residual deficits: Secondary | ICD-10-CM | POA: Diagnosis not present

## 2024-06-23 DIAGNOSIS — I10 Essential (primary) hypertension: Secondary | ICD-10-CM | POA: Diagnosis present

## 2024-06-23 DIAGNOSIS — K861 Other chronic pancreatitis: Secondary | ICD-10-CM | POA: Diagnosis present

## 2024-06-23 DIAGNOSIS — Z8249 Family history of ischemic heart disease and other diseases of the circulatory system: Secondary | ICD-10-CM

## 2024-06-23 DIAGNOSIS — E1151 Type 2 diabetes mellitus with diabetic peripheral angiopathy without gangrene: Secondary | ICD-10-CM | POA: Diagnosis present

## 2024-06-23 DIAGNOSIS — G40909 Epilepsy, unspecified, not intractable, without status epilepticus: Secondary | ICD-10-CM | POA: Diagnosis present

## 2024-06-23 DIAGNOSIS — E1010 Type 1 diabetes mellitus with ketoacidosis without coma: Secondary | ICD-10-CM

## 2024-06-23 DIAGNOSIS — Z515 Encounter for palliative care: Secondary | ICD-10-CM | POA: Diagnosis not present

## 2024-06-23 DIAGNOSIS — K219 Gastro-esophageal reflux disease without esophagitis: Secondary | ICD-10-CM | POA: Diagnosis present

## 2024-06-23 DIAGNOSIS — L97909 Non-pressure chronic ulcer of unspecified part of unspecified lower leg with unspecified severity: Secondary | ICD-10-CM | POA: Diagnosis not present

## 2024-06-23 DIAGNOSIS — Z993 Dependence on wheelchair: Secondary | ICD-10-CM

## 2024-06-23 DIAGNOSIS — Z955 Presence of coronary angioplasty implant and graft: Secondary | ICD-10-CM

## 2024-06-23 DIAGNOSIS — Z833 Family history of diabetes mellitus: Secondary | ICD-10-CM

## 2024-06-23 DIAGNOSIS — M7989 Other specified soft tissue disorders: Secondary | ICD-10-CM | POA: Diagnosis not present

## 2024-06-23 DIAGNOSIS — Z560 Unemployment, unspecified: Secondary | ICD-10-CM

## 2024-06-23 DIAGNOSIS — I69351 Hemiplegia and hemiparesis following cerebral infarction affecting right dominant side: Secondary | ICD-10-CM | POA: Diagnosis not present

## 2024-06-23 DIAGNOSIS — I70292 Other atherosclerosis of native arteries of extremities, left leg: Secondary | ICD-10-CM | POA: Diagnosis not present

## 2024-06-23 LAB — COMPREHENSIVE METABOLIC PANEL WITH GFR
ALT: 23 U/L (ref 0–44)
AST: 37 U/L (ref 15–41)
Albumin: 3.2 g/dL — ABNORMAL LOW (ref 3.5–5.0)
Alkaline Phosphatase: 185 U/L — ABNORMAL HIGH (ref 38–126)
Anion gap: 14 (ref 5–15)
BUN: 16 mg/dL (ref 6–20)
CO2: 21 mmol/L — ABNORMAL LOW (ref 22–32)
Calcium: 9.4 mg/dL (ref 8.9–10.3)
Chloride: 100 mmol/L (ref 98–111)
Creatinine, Ser: 0.6 mg/dL — ABNORMAL LOW (ref 0.61–1.24)
GFR, Estimated: >60 mL/min (ref >60)
Glucose, Bld: 155 mg/dL — ABNORMAL HIGH (ref 70–99)
Potassium: 4.4 mmol/L (ref 3.5–5.1)
Sodium: 135 mmol/L (ref 135–145)
Total Bilirubin: 0.3 mg/dL (ref 0.0–1.2)
Total Protein: 7.3 g/dL (ref 6.5–8.1)

## 2024-06-23 LAB — CBC WITH DIFFERENTIAL/PLATELET
Abs Immature Granulocytes: 0.05 K/uL (ref 0.00–0.07)
Basophils Absolute: 0 K/uL (ref 0.0–0.1)
Basophils Relative: 0 %
Eosinophils Absolute: 0 K/uL (ref 0.0–0.5)
Eosinophils Relative: 0 %
HCT: 28.9 % — ABNORMAL LOW (ref 39.0–52.0)
Hemoglobin: 9.1 g/dL — ABNORMAL LOW (ref 13.0–17.0)
Immature Granulocytes: 1 %
Lymphocytes Relative: 12 %
Lymphs Abs: 1.3 K/uL (ref 0.7–4.0)
MCH: 28.8 pg (ref 26.0–34.0)
MCHC: 31.5 g/dL (ref 30.0–36.0)
MCV: 91.5 fL (ref 80.0–100.0)
Monocytes Absolute: 0.8 K/uL (ref 0.1–1.0)
Monocytes Relative: 8 %
Neutro Abs: 8.8 K/uL — ABNORMAL HIGH (ref 1.7–7.7)
Neutrophils Relative %: 79 %
Platelets: 400 K/uL (ref 150–400)
RBC: 3.16 MIL/uL — ABNORMAL LOW (ref 4.22–5.81)
RDW: 14.7 % (ref 11.5–15.5)
WBC: 11.1 K/uL — ABNORMAL HIGH (ref 4.0–10.5)
nRBC: 0 % (ref 0.0–0.2)

## 2024-06-23 LAB — I-STAT CG4 LACTIC ACID, ED: Lactic Acid, Venous: 1.3 mmol/L (ref 0.5–1.9)

## 2024-06-23 LAB — GLUCOSE, CAPILLARY: Glucose-Capillary: 155 mg/dL — ABNORMAL HIGH (ref 70–99)

## 2024-06-23 MED ORDER — ONDANSETRON HCL 4 MG PO TABS: 4.0000 mg | ORAL_TABLET | Freq: Four times a day (QID) | ORAL | Status: DC | PRN

## 2024-06-23 MED ORDER — CLOPIDOGREL BISULFATE 75 MG PO TABS
75.0000 mg | ORAL_TABLET | Freq: Every day | ORAL | Status: DC
  Administered 2024-06-24 – 2024-07-07 (×14): 75 mg via ORAL
  Filled 2024-06-23 (×14): qty 1

## 2024-06-23 MED ORDER — FAMOTIDINE 20 MG PO TABS
20.0000 mg | ORAL_TABLET | Freq: Two times a day (BID) | ORAL | Status: DC
  Administered 2024-06-23 – 2024-07-07 (×28): 20 mg via ORAL
  Filled 2024-06-23 (×28): qty 1

## 2024-06-23 MED ORDER — TAMSULOSIN HCL 0.4 MG PO CAPS
0.4000 mg | ORAL_CAPSULE | Freq: Every day | ORAL | Status: DC
  Administered 2024-06-23 – 2024-07-06 (×14): 0.4 mg via ORAL
  Filled 2024-06-23 (×14): qty 1

## 2024-06-23 MED ORDER — PANCRELIPASE (LIP-PROT-AMYL) 12000-38000 UNITS PO CPEP
12000.0000 [IU] | ORAL_CAPSULE | Freq: Three times a day (TID) | ORAL | Status: DC
  Administered 2024-06-24 – 2024-07-07 (×39): 12000 [IU] via ORAL
  Filled 2024-06-23 (×42): qty 1

## 2024-06-23 MED ORDER — SODIUM CHLORIDE 0.9 % IV SOLN
3.0000 g | Freq: Once | INTRAVENOUS | Status: AC
  Administered 2024-06-23: 3 g via INTRAVENOUS
  Filled 2024-06-23: qty 8

## 2024-06-23 MED ORDER — ROSUVASTATIN CALCIUM 5 MG PO TABS
5.0000 mg | ORAL_TABLET | Freq: Every day | ORAL | Status: DC
  Administered 2024-06-23 – 2024-07-06 (×14): 5 mg via ORAL
  Filled 2024-06-23 (×14): qty 1

## 2024-06-23 MED ORDER — INSULIN ASPART 100 UNIT/ML IJ SOLN
0.0000 [IU] | Freq: Every day | INTRAMUSCULAR | Status: DC
  Administered 2024-06-26: 3 [IU] via SUBCUTANEOUS
  Administered 2024-06-28: 2 [IU] via SUBCUTANEOUS

## 2024-06-23 MED ORDER — SODIUM CHLORIDE 0.9 % IV SOLN
3.0000 g | Freq: Four times a day (QID) | INTRAVENOUS | Status: DC
  Administered 2024-06-23 – 2024-07-02 (×35): 3 g via INTRAVENOUS
  Filled 2024-06-23 (×36): qty 8

## 2024-06-23 MED ORDER — ACETAMINOPHEN 650 MG RE SUPP: 650.0000 mg | Freq: Four times a day (QID) | RECTAL | Status: DC | PRN

## 2024-06-23 MED ORDER — SERTRALINE HCL 100 MG PO TABS
100.0000 mg | ORAL_TABLET | Freq: Every day | ORAL | Status: DC
  Administered 2024-06-24 – 2024-07-07 (×14): 100 mg via ORAL
  Filled 2024-06-23 (×14): qty 1

## 2024-06-23 MED ORDER — PANCRELIPASE (LIP-PROT-AMYL) 3000-9500 UNITS PO CPEP: 3000.0000 [IU] | ORAL_CAPSULE | Freq: Three times a day (TID) | ORAL | Status: DC

## 2024-06-23 MED ORDER — ADULT MULTIVITAMIN W/MINERALS CH
1.0000 | ORAL_TABLET | Freq: Every day | ORAL | Status: DC
  Administered 2024-06-24 – 2024-07-07 (×14): 1 via ORAL
  Filled 2024-06-23 (×14): qty 1

## 2024-06-23 MED ORDER — PANCRELIPASE (LIP-PROT-AMYL) 36000-114000 UNITS PO CPEP
36000.0000 [IU] | ORAL_CAPSULE | Freq: Three times a day (TID) | ORAL | Status: DC
  Filled 2024-06-23: qty 1

## 2024-06-23 MED ORDER — GLUCERNA SHAKE PO LIQD
1.0000 | Freq: Three times a day (TID) | ORAL | Status: DC
  Administered 2024-06-24 – 2024-06-26 (×9): 237 mL via ORAL
  Filled 2024-06-23 (×6): qty 237

## 2024-06-23 MED ORDER — TRAZODONE HCL 100 MG PO TABS: 50.0000 mg | ORAL_TABLET | Freq: Every day | ORAL | Status: DC

## 2024-06-23 MED ORDER — MIDODRINE HCL 5 MG PO TABS
10.0000 mg | ORAL_TABLET | Freq: Three times a day (TID) | ORAL | Status: DC
  Administered 2024-06-23 – 2024-07-07 (×39): 10 mg via ORAL
  Filled 2024-06-23 (×40): qty 2

## 2024-06-23 MED ORDER — BACLOFEN 10 MG PO TABS
20.0000 mg | ORAL_TABLET | Freq: Three times a day (TID) | ORAL | Status: DC
  Administered 2024-06-23 – 2024-07-07 (×41): 20 mg via ORAL
  Filled 2024-06-23 (×41): qty 2

## 2024-06-23 MED ORDER — BUSPIRONE HCL 5 MG PO TABS
10.0000 mg | ORAL_TABLET | Freq: Two times a day (BID) | ORAL | Status: DC
  Administered 2024-06-23 – 2024-07-07 (×28): 10 mg via ORAL
  Filled 2024-06-23 (×28): qty 2

## 2024-06-23 MED ORDER — LEVETIRACETAM 500 MG PO TABS
500.0000 mg | ORAL_TABLET | Freq: Two times a day (BID) | ORAL | Status: DC
  Administered 2024-06-23 – 2024-07-07 (×28): 500 mg via ORAL
  Filled 2024-06-23 (×28): qty 1

## 2024-06-23 MED ORDER — BISACODYL 5 MG PO TBEC
5.0000 mg | DELAYED_RELEASE_TABLET | Freq: Every day | ORAL | Status: DC | PRN
  Administered 2024-06-24: 5 mg via ORAL
  Filled 2024-06-23: qty 1

## 2024-06-23 MED ORDER — INSULIN ASPART 100 UNIT/ML IJ SOLN
0.0000 [IU] | Freq: Three times a day (TID) | INTRAMUSCULAR | Status: DC
  Administered 2024-06-24: 2 [IU] via SUBCUTANEOUS
  Administered 2024-06-24 (×2): 5 [IU] via SUBCUTANEOUS
  Administered 2024-06-25 (×2): 1 [IU] via SUBCUTANEOUS
  Administered 2024-06-25: 2 [IU] via SUBCUTANEOUS
  Administered 2024-06-27: 3 [IU] via SUBCUTANEOUS
  Administered 2024-06-27: 2 [IU] via SUBCUTANEOUS
  Administered 2024-06-27: 3 [IU] via SUBCUTANEOUS
  Administered 2024-06-28: 1 [IU] via SUBCUTANEOUS
  Administered 2024-06-28 – 2024-06-30 (×5): 2 [IU] via SUBCUTANEOUS
  Administered 2024-06-30: 9 [IU] via SUBCUTANEOUS
  Administered 2024-06-30: 2 [IU] via SUBCUTANEOUS
  Administered 2024-07-01: 7 [IU] via SUBCUTANEOUS
  Administered 2024-07-01: 3 [IU] via SUBCUTANEOUS

## 2024-06-23 MED ORDER — ONDANSETRON HCL 4 MG/2ML IJ SOLN: 4.0000 mg | Freq: Four times a day (QID) | INTRAMUSCULAR | Status: DC | PRN

## 2024-06-23 MED ORDER — LINEZOLID 600 MG/300ML IV SOLN
600.0000 mg | Freq: Two times a day (BID) | INTRAVENOUS | Status: DC
Start: 2024-06-23 — End: 2024-07-02
  Administered 2024-06-23 – 2024-07-02 (×17): 600 mg via INTRAVENOUS
  Filled 2024-06-23 (×21): qty 300

## 2024-06-23 MED ORDER — ENOXAPARIN SODIUM 40 MG/0.4ML IJ SOSY
40.0000 mg | PREFILLED_SYRINGE | INTRAMUSCULAR | Status: DC
  Administered 2024-06-23 – 2024-07-06 (×14): 40 mg via SUBCUTANEOUS
  Filled 2024-06-23 (×14): qty 0.4

## 2024-06-23 MED ORDER — HYDROMORPHONE HCL 1 MG/ML IJ SOLN
0.5000 mg | Freq: Four times a day (QID) | INTRAMUSCULAR | Status: DC | PRN
  Administered 2024-06-23 – 2024-07-07 (×12): 0.5 mg via INTRAVENOUS
  Filled 2024-06-23 (×13): qty 0.5

## 2024-06-23 MED ORDER — MELATONIN 3 MG PO TABS
3.0000 mg | ORAL_TABLET | Freq: Every day | ORAL | Status: DC
  Administered 2024-06-23 – 2024-07-06 (×14): 3 mg via ORAL
  Filled 2024-06-23 (×14): qty 1

## 2024-06-23 MED ORDER — INSULIN GLARGINE 100 UNIT/ML ~~LOC~~ SOLN
10.0000 [IU] | Freq: Every day | SUBCUTANEOUS | Status: DC
  Administered 2024-06-24 – 2024-07-01 (×8): 10 [IU] via SUBCUTANEOUS
  Filled 2024-06-23 (×8): qty 0.1

## 2024-06-23 MED ORDER — ACETAMINOPHEN 325 MG PO TABS
650.0000 mg | ORAL_TABLET | Freq: Four times a day (QID) | ORAL | Status: DC | PRN
  Administered 2024-07-02 – 2024-07-04 (×3): 650 mg via ORAL
  Filled 2024-06-23 (×3): qty 2

## 2024-06-23 MED ORDER — ACETAMINOPHEN 500 MG PO TABS
1000.0000 mg | ORAL_TABLET | Freq: Once | ORAL | Status: AC
  Administered 2024-06-23: 1000 mg via ORAL
  Filled 2024-06-23: qty 2

## 2024-06-23 MED ORDER — MUPIROCIN 2 % EX OINT
1.0000 | TOPICAL_OINTMENT | Freq: Every day | CUTANEOUS | Status: DC
  Administered 2024-06-23 – 2024-07-07 (×15): 1 via TOPICAL
  Filled 2024-06-23 (×6): qty 22

## 2024-06-23 MED ORDER — OXYCODONE HCL 5 MG PO TABS
5.0000 mg | ORAL_TABLET | Freq: Four times a day (QID) | ORAL | Status: DC | PRN
  Administered 2024-06-24 – 2024-07-03 (×9): 5 mg via ORAL
  Filled 2024-06-23 (×10): qty 1

## 2024-06-23 MED ORDER — FERROUS SULFATE 325 (65 FE) MG PO TABS
325.0000 mg | ORAL_TABLET | Freq: Every day | ORAL | Status: DC
  Administered 2024-06-24 – 2024-07-07 (×13): 325 mg via ORAL
  Filled 2024-06-23 (×14): qty 1

## 2024-06-23 MED ORDER — SENNOSIDES-DOCUSATE SODIUM 8.6-50 MG PO TABS: 1.0000 | ORAL_TABLET | Freq: Every evening | ORAL | Status: DC | PRN

## 2024-06-23 MED ORDER — SODIUM CHLORIDE 0.9 % IV SOLN: INTRAVENOUS | Status: AC

## 2024-06-23 MED ORDER — MIRTAZAPINE 15 MG PO TABS
7.5000 mg | ORAL_TABLET | Freq: Every day | ORAL | Status: DC
  Administered 2024-06-23 – 2024-07-06 (×14): 7.5 mg via ORAL
  Filled 2024-06-23 (×14): qty 1

## 2024-06-23 MED ORDER — LINEZOLID 600 MG PO TABS
600.0000 mg | ORAL_TABLET | Freq: Once | ORAL | Status: AC
  Administered 2024-06-23: 600 mg via ORAL
  Filled 2024-06-23 (×2): qty 1

## 2024-06-23 MED ORDER — SODIUM CHLORIDE 0.9 % IV BOLUS
1000.0000 mL | Freq: Once | INTRAVENOUS | Status: AC
  Administered 2024-06-23: 1000 mL via INTRAVENOUS

## 2024-06-23 MED ORDER — VITAMIN C 500 MG PO TABS
500.0000 mg | ORAL_TABLET | Freq: Every day | ORAL | Status: DC
  Administered 2024-06-24 – 2024-07-07 (×14): 500 mg via ORAL
  Filled 2024-06-23 (×14): qty 1

## 2024-06-23 MED ORDER — SERTRALINE HCL 50 MG PO TABS
50.0000 mg | ORAL_TABLET | Freq: Every day | ORAL | Status: DC
Start: 2024-06-24 — End: 2024-06-23

## 2024-06-23 NOTE — Consult Note (Signed)
 WOC Nurse Consult Note: patient with history of CVA and bedbound per notes  Reason for Consult: L foot wound  Wound type: 1.  Stage 4 Pressure Injury L lateral foot (bone palpable per notes) tan necrotic tissue  2.  Unstageable Pressure Injury L heel brown/black eschar  Pressure Injury POA: Yes Measurement: see nursing flowsheet  Wound bed: as above  Drainage (amount, consistency, odor) purulent foul smelling from L lateral foot wound, dry appearing heel  Periwound: erythema  Dressing procedure/placement/frequency:  Cleanse L lateral foot wound with Vashe wound cleanser Soila [phone]) do not rinse and allow to air dry. Apply Vashe moistened gauze to wound bed daily and secure with ABD pad and Kerlix roll gauze.   Cleanse L heel wound with Vashe, apply Xeroform gauze (Lawson [id number]) and secure with silicone foam.  Place L foot in Prevalon boot to offload pressure. Gerlean #[id number]   WOC team will not follow. Patient is pending MRI to rule out osteomyelitis and likely consult to orthopedics/podiatry for treatment.   Thank you,    Powell Bar MSN, RN-BC, [employer]

## 2024-06-23 NOTE — ED Provider Notes (Signed)
 [name] EMERGENCY DEPARTMENT AT [facility] Provider Note   CSN: [id number] Arrival date & time: 06/23/24  1143     Patient presents with: Wound Check and Generalized Body Aches   Brandon [name] is a 58 y.o. male.   The history is provided by the patient, the EMS personnel and medical records. No language interpreter was used.  Wound Check     58 year old male with significant history of prior stroke with left-sided neglect, insulin -dependent diabetes, CAD, hypertension, who lives in a nursing facility brought here for concerns of wound infection.  Patient has been complaining of increasing pain to his left foot ongoing for the past week.  He also endorsing generalized bodyaches.  He has a wound to his left foot that has been managed at the facility.  They are concerned that it is grossly infected.  Patient is without other any complaint.  Does not endorse any fever or chills denies any change in his diet or appetite denies any chest pain or trouble breathing denies any abdominal pain or urinary symptoms.  Patient however is a poor historian.  He is mostly bedbound.  Prior to Admission medications   Medication Sig Start Date End Date Taking? Authorizing Provider  acetaminophen  (TYLENOL ) 325 MG tablet Take 650 mg by mouth every 6 (six) hours as needed for mild pain (pain score 1-3) (cannot exceed a total of 3,000 mg/24 hours).    [provider]  ascorbic acid  (VITAMIN C ) 500 MG tablet Take 500 mg by mouth daily. 11/26/21   [provider]  busPIRone  (BUSPAR ) 10 MG tablet Take 1 tablet (10 mg total) by mouth 2 (two) times daily. 12/23/20   Gherghe, Costin M, MD  clopidogrel  (PLAVIX ) 75 MG tablet Take 1 tablet (75 mg total) by mouth daily. 08/14/23   Elnora Ip, MD  famotidine  (PEPCID ) 20 MG tablet Take 1 tablet (20 mg total) by mouth 2 (two) times daily. 12/23/20   Gherghe, Costin M, MD  feeding supplement, GLUCERNA SHAKE, (GLUCERNA SHAKE) LIQD Take  1 Container by mouth in the morning, at noon, and at bedtime.    [provider]  FEROSUL 325 (65 Fe) MG tablet Take 1 tablet (325 mg total) by mouth daily. 12/23/20   Gherghe, Costin M, MD  insulin  glargine (LANTUS ) 100 UNIT/ML injection Inject 10 Units into the skin daily.    [provider]  insulin  lispro (HUMALOG KWIKPEN) 100 UNIT/ML KwikPen Inject 0-5 Units into the skin in the morning, at noon, and at bedtime. BS 70-130 inject 0 units  BS 131-180 inject 1 units  BS 181-240 inject 2 units  BS 241-300 inject 3 units  BS 301-350 inject 4 units  BS 351-400 inject 5 units    [provider]  levETIRAcetam  (KEPPRA ) 500 MG tablet Take 500 mg by mouth 2 (two) times daily.    [provider]  lipase/protease/amylase (CREON ) 36000 UNITS CPEP capsule Take 1 capsule (36,000 Units total) by mouth 3 (three) times daily with meals. 08/01/23   [name], [place], MD  midodrine  (PROAMATINE ) 10 MG tablet Take 1 tablet (10 mg total) by mouth 3 (three) times daily with meals. Please hold for systolic blood pressure more than 110. 01/21/24   Elgergawy, Brayton RAMAN, MD  Multiple Vitamin (MULTIVITAMIN WITH MINERALS) TABS tablet Take 1 tablet by mouth daily. 12/23/20   Gherghe, Costin M, MD  mupirocin  ointment (BACTROBAN ) 2 % Apply 1 Application topically daily. 03/31/24   [name], MD  nystatin cream (MYCOSTATIN)  Apply 1 Application topically every 4 (four) hours as needed for dry skin.    [provider]  ondansetron  (ZOFRAN ) 4 MG tablet Take 4 mg by mouth every 6 (six) hours as needed for nausea or vomiting.    [provider]  oxyCODONE  (OXY IR/ROXICODONE ) 5 MG immediate release tablet Take 1 tablet (5 mg total) by mouth every 6 (six) hours as needed for severe pain (pain score 7-10). 03/31/24   [name], MD  polyethylene glycol (MIRALAX  / GLYCOLAX ) 17 g packet Take 17 g by mouth daily as needed. Patient taking differently: Take 17 g by mouth daily.  01/20/24   Dennise Lavada POUR, MD  rosuvastatin  (CRESTOR ) 5 MG tablet Take 5 mg by mouth at bedtime.    [provider]  senna (SENOKOT) 8.6 MG TABS tablet Take 2 tablets by mouth in the morning and at bedtime.    [provider]  sertraline  (ZOLOFT ) 50 MG tablet Take 50 mg by mouth daily.    [provider]  tamsulosin  (FLOMAX ) 0.4 MG CAPS capsule Take 1 capsule (0.4 mg total) by mouth daily after supper. 08/01/23   [name], [place], MD  traZODone  (DESYREL ) 50 MG tablet Take 1 tablet (50 mg total) by mouth at bedtime. 01/20/24   Singh, Prashant K, MD  zinc oxide 20 % ointment Apply 1 Application topically in the morning and at bedtime.    [provider]    Allergies: Patient has no known allergies.    Review of Systems  All other systems reviewed and are negative.   Updated Vital Signs BP 91/66 (BP Location: Left Arm)   Pulse 74   Temp (!) 97.5 F (36.4 C) (Axillary)   Resp 19   SpO2 98%   Physical Exam Constitutional:      General: He is not in acute distress.    Appearance: He is well-developed.     Comments: Chronically ill-appearing male laying in bed in a few position appears to be in no acute discomfort  HENT:     Head: Atraumatic.  Eyes:     Conjunctiva/sclera: Conjunctivae normal.  Cardiovascular:     Rate and Rhythm: Normal rate and regular rhythm.     Heart sounds: Murmur heard.  Pulmonary:     Effort: Pulmonary effort is normal.     Breath sounds: Normal breath sounds. No wheezing, rhonchi or rales.  Abdominal:     Palpations: Abdomen is soft.     Tenderness: There is no abdominal tenderness.  Musculoskeletal:        General: Tenderness (Left foot: Patient has a 5 cm oval deep ulceration noted to the lateral aspect of his foot with surrounding erythema and odor.  It is tender to palpation.) present.     Cervical back: Normal range of motion and neck supple.  Skin:    Findings: No rash.  Neurological:     Mental  Status: He is alert.     (all labs ordered are listed, but only abnormal results are displayed) Labs Reviewed  COMPREHENSIVE METABOLIC PANEL WITH GFR - Abnormal; Notable for the following components:      Result Value   CO2 21 (*)    Glucose, Bld 155 (*)    Creatinine, Ser 0.60 (*)    Albumin  3.2 (*)    Alkaline Phosphatase 185 (*)    All other components within normal limits  CBC WITH DIFFERENTIAL/PLATELET - Abnormal; Notable for the following components:   WBC 11.1 (*)  RBC 3.16 (*)    Hemoglobin 9.1 (*)    HCT 28.9 (*)    Neutro Abs 8.8 (*)    All other components within normal limits  CULTURE, BLOOD (ROUTINE X 2)  CULTURE, BLOOD (ROUTINE X 2)  I-STAT CG4 LACTIC ACID, ED    EKG: None  Radiology: DG Foot Complete Left Result Date: 06/23/2024 CLINICAL DATA:  Pressure ulcer of left foot. EXAM: LEFT FOOT - COMPLETE 3+ VIEW COMPARISON:  None Available. FINDINGS: Mild diffuse decreased bone mineralization. Minimal degenerative changes of the first MTP joint. Mild degenerative changes over the midfoot and hindfoot. No acute fracture or dislocation. Soft tissue changes over the plantar lateral aspect of the midfoot at the level of the base of the fifth metatarsal. Subtle lucency within the adjacent base of the fifth metatarsal which could be seen due to infection. No significant air in the soft tissues. IMPRESSION: Soft tissue changes over the plantar lateral aspect of the midfoot at the level of the base of the fifth metatarsal. Subtle lucency within the adjacent base of the fifth metatarsal which could be due to infection. Consider MRI for further evaluation. Electronically Signed   By: Toribio Agreste M.D.   On: 06/23/2024 15:48     Procedures   Medications Ordered in the ED  sodium chloride  0.9 % bolus 1,000 mL (1,000 mLs Intravenous New Bag/Given 06/23/24 1455)    And  0.9 %  sodium chloride  infusion ( Intravenous New Bag/Given 06/23/24 1502)  Ampicillin-Sulbactam (UNASYN) 3 g  in sodium chloride  0.9 % 100 mL IVPB (3 g Intravenous New Bag/Given 06/23/24 1504)    And  linezolid (ZYVOX) tablet 600 mg (600 mg Oral Given 06/23/24 1547)  acetaminophen  (TYLENOL ) tablet 1,000 mg (1,000 mg Oral Given 06/23/24 1505)                                    Medical Decision Making Amount and/or Complexity of Data Reviewed Labs: ordered. Radiology: ordered.  Risk OTC drugs. Prescription drug management. Decision regarding hospitalization.   BP 91/66 (BP Location: Left Arm)   Pulse 74   Temp (!) 97.5 F (36.4 C) (Axillary)   Resp 19   SpO2 98%   80:13 PM  58 year old male with significant history of prior stroke with left-sided neglect, insulin -dependent diabetes, CAD, hypertension, who lives in a nursing facility brought here for concerns of wound infection.  Patient has been complaining of increasing pain to his left foot ongoing for the past week.  He also endorsing generalized bodyaches.  He has a wound to his left foot that has been managed at the facility.  They are concerned that it is grossly infected.  Patient is without other any complaint.  Does not endorse any fever or chills denies any change in his diet or appetite denies any chest pain or trouble breathing denies any abdominal pain or urinary symptoms.  Patient however is a poor historian.  He is mostly bedbound.  Exam notable for diabetic foot ulcer involving the lateral aspect of the left foot with foul-smelling and surrounding erythema.  Able to palpate DP pulse, brisk cap refill.  -Labs ordered, independently viewed and interpreted by me.  Labs remarkable for elevated white count of 11.1.  Hemoglobin is 9.1 similar to baseline.  Normal lactic acid -The patient was maintained on a cardiac monitor.  I personally viewed and interpreted the cardiac monitored which showed an underlying  rhythm of: Normal sinus rhythm -Imaging independently viewed and interpreted by me and I agree with radiologist's interpretation.   Result remarkable for x-ray of the left foot showing soft tissue changes to the midfoot with subtle lucency concerning for infection.  Will obtain MRI for further evaluation -This patient presents to the ED for concern of foot pain, this involves an extensive number of treatment options, and is a complaint that carries with it a high risk of complications and morbidity.  The differential diagnosis includes infected diabetic foot ulcer, vascular compromise, ischemic foot, cellulitis, abscess, osteomyelitis -Co morbidities that complicate the patient evaluation includes diabetes, CAD, hypertension, prior stroke -Treatment includes linezolid, Tylenol , Unasyn, IV fluid -Reevaluation of the patient after these medicines showed that the patient stayed the same -PCP office notes or outside notes reviewed -Discussion with specialist [facility] hospitalist Dr. Benita who agrees to admit patient due to concerns of osteomyelitis of the left foot. -Escalation to admission/observation considered: patient to be admitted      Final diagnoses:  Diabetic ulcer of left midfoot associated with type 2 diabetes mellitus, unspecified ulcer stage [facility])    ED Discharge Orders     None          Nivia Colon, PA-C 06/23/24 1819    Dreama Longs, MD 06/23/24 2248

## 2024-06-23 NOTE — H&P (Addendum)
 History and Physical  Brandon [name] FMW:[id number] DOB: 1966-02-26 DOA: 06/23/2024  PCP: Brandon Channing SQUIBB, FNP   Chief Complaint: Wound check, left foot pain  HPI: Brandon [name] is a 58 y.o. male with medical history significant for CVA with residual right-sided hemiparesis, CAD s/p stenting in 2016, hypertension, hyperlipidemia, seizure disorder, prior history of smoking cigarette and alcohol  use, history of thoracic discitis with ventral epidural abscess in 12/2023 resulted in cord compression-completed 2 months of antibiotic (not a candidate for any procedure per IR and NSG), history of SVT, chronic hypotension, transaminitis, normocytic anemia, chronic pancreatitis with pancreatic insufficiency, BPH, insulin -dependent DM type II presenting from [facility] nursing home for evaluation of worsening left foot wound. Per EMS, they were called to nursing home facility due to patient having worsening left foot pressure ulcer but has begun to show some bone. On evaluation, patient reports increasing left foot pain over the last week and a foot wound that is not healing appropriately. He denies any fevers, chills, nausea, vomiting, abdominal pain, chest pain, shortness of breath, numbness or tingling.  Patient overall poor historian and not oriented to time or situation (unclear baseline).   ED Course: Initial vitals show temp 97.5, RR 19, HR 74, SBP 90-100s, SpO2 98% on room air. Initial labs significant for WBC 11.1, Hgb 9.1, blood glucose 155, alk phos 185, albumin  3.2, lactic acid 1.3.  X-ray of the left foot shows subtle lucency within the adjacent base of the fifth metatarsal which could be due to infection. EDP asked to place order for MRI of the left foot due to my concern for possible osteomyelitis. Pt received IV NS 1 L bolus, IV Unasyn and oral Zyvox. TRH was consulted for admission.   Review of Systems: Please see HPI for pertinent positives and negatives. A complete 10 system review of  systems are otherwise negative.  Past Medical History:  Diagnosis Date   Alcohol  dependency (HCC)    Hx ETOH withdrawal seizures before 2011   CAD (coronary artery disease) 06/13/2012   Calcification noted on CTA of chest in 2012 Wall motion abnormality on ECHO     Carotid artery disease 2016   bilateral.  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    Depression with anxiety    Diabetes mellitus without complication (HCC)    GERD (gastroesophageal reflux disease)    Headache(784.0)    migraine   Heart disease 02/2015   PCI/DES placed to RCA: on chronic Plavix /ASA   Hyperlipidemia    Hypertension    Pancreatitis 2011....    CT findings in May 2011 with inflammation and pseudocyst.  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/2016   Renal artery stenosis, native, bilateral 02/2015   Past Surgical History:  Procedure Laterality Date   CARDIAC CATHETERIZATION N/A 03/15/2015   Procedure: Left Heart Cath;  Surgeon: Denyse DELENA Bathe, MD;  Location: [facility] INVASIVE CV LAB;  Service: Cardiovascular;  Laterality: N/A;   CARDIAC CATHETERIZATION N/A 03/16/2015   Procedure: Coronary Stent Intervention;  Surgeon: Cara JONETTA Lovelace, MD;  Location: [facility] INVASIVE CV LAB;  Service: Cardiovascular;  Laterality: N/A;   CAROTID ANGIOGRAM N/A 06/15/2012   Procedure: CAROTID ANGIOGRAM;  Surgeon: Lonni GORMAN Blade, MD;  Location: [facility] CATH LAB;  Service: Cardiovascular;  Laterality: N/A;   ESOPHAGOGASTRODUODENOSCOPY N/A 03/08/2016   Procedure: ESOPHAGOGASTRODUODENOSCOPY (EGD);  Surgeon: Elspeth Deward Naval, MD;  Location: [facility] ENDOSCOPY;  Service:  Gastroenterology;  Laterality: N/A;   none     PERIPHERAL VASCULAR CATHETERIZATION Left 04/06/2015   Procedure: Carotid PTA/Stent Intervention;  Surgeon: Selinda GORMAN Gu, MD;  Location: [facility] INVASIVE CV LAB;  Service: Cardiovascular;  Laterality: Left;   Social History:  reports that he has been  smoking cigarettes. He has a 30 pack-year smoking history. He has never used smokeless tobacco. He reports that he does not drink alcohol  and does not use drugs.  No Known Allergies  Family History  Problem Relation Age of Onset   Stroke Mother        deceased   Coronary artery disease Mother    Alcohol  abuse Mother    Cancer Mother    Hypertension Father        alive   Alcohol  abuse Father    Diabetes Father    Kidney disease Father    Stroke Maternal Grandmother      Prior to Admission medications   Medication Sig Start Date End Date Taking? Authorizing Provider  acetaminophen  (TYLENOL ) 325 MG tablet Take 650 mg by mouth every 6 (six) hours as needed for mild pain (pain score 1-3) (cannot exceed a total of 3,000 mg/24 hours).    [provider]  ascorbic acid  (VITAMIN C ) 500 MG tablet Take 500 mg by mouth daily. 11/26/21   [provider]  busPIRone  (BUSPAR ) 10 MG tablet Take 1 tablet (10 mg total) by mouth 2 (two) times daily. 12/23/20   Gherghe, Costin M, MD  clopidogrel  (PLAVIX ) 75 MG tablet Take 1 tablet (75 mg total) by mouth daily. 08/14/23   Elnora Ip, MD  famotidine  (PEPCID ) 20 MG tablet Take 1 tablet (20 mg total) by mouth 2 (two) times daily. 12/23/20   Gherghe, Costin M, MD  feeding supplement, GLUCERNA SHAKE, (GLUCERNA SHAKE) LIQD Take 1 Container by mouth in the morning, at noon, and at bedtime.    [provider]  FEROSUL 325 (65 Fe) MG tablet Take 1 tablet (325 mg total) by mouth daily. 12/23/20   Gherghe, Costin M, MD  insulin  glargine (LANTUS ) 100 UNIT/ML injection Inject 10 Units into the skin daily.    [provider]  insulin  lispro (HUMALOG KWIKPEN) 100 UNIT/ML KwikPen Inject 0-5 Units into the skin in the morning, at noon, and at bedtime. BS 70-130 inject 0 units  BS 131-180 inject 1 units  BS 181-240 inject 2 units  BS 241-300 inject 3 units  BS 301-350 inject 4 units  BS 351-400 inject 5 units    [provider]  levETIRAcetam  (KEPPRA ) 500 MG tablet Take 500 mg by mouth 2 (two) times daily.    [provider]  lipase/protease/amylase (CREON ) 36000 UNITS CPEP capsule Take 1 capsule (36,000 Units total) by mouth 3 (three) times daily with meals. 08/01/23   [name], [place], MD  midodrine  (PROAMATINE ) 10 MG tablet Take 1 tablet (10 mg total) by mouth 3 (three) times daily with meals. Please hold for systolic blood pressure more than 110. 01/21/24   Elgergawy, Brayton GORMAN, MD  Multiple Vitamin (MULTIVITAMIN WITH MINERALS) TABS tablet Take 1 tablet by mouth daily. 12/23/20   Gherghe, Costin M, MD  mupirocin  ointment (BACTROBAN ) 2 % Apply 1 Application topically daily. 03/31/24   [name], MD  nystatin cream (MYCOSTATIN) Apply 1 Application topically every 4 (four) hours as needed for dry skin.    [provider]  ondansetron  (ZOFRAN ) 4 MG tablet Take 4 mg by mouth every 6 (six) hours as needed  for nausea or vomiting.    [provider]  oxyCODONE  (OXY IR/ROXICODONE ) 5 MG immediate release tablet Take 1 tablet (5 mg total) by mouth every 6 (six) hours as needed for severe pain (pain score 7-10). 03/31/24   [name], MD  polyethylene glycol (MIRALAX  / GLYCOLAX ) 17 g packet Take 17 g by mouth daily as needed. Patient taking differently: Take 17 g by mouth daily. 01/20/24   Dennise Lavada POUR, MD  rosuvastatin  (CRESTOR ) 5 MG tablet Take 5 mg by mouth at bedtime.    [provider]  senna (SENOKOT) 8.6 MG TABS tablet Take 2 tablets by mouth in the morning and at bedtime.    [provider]  sertraline  (ZOLOFT ) 50 MG tablet Take 50 mg by mouth daily.    [provider]  tamsulosin  (FLOMAX ) 0.4 MG CAPS capsule Take 1 capsule (0.4 mg total) by mouth daily after supper. 08/01/23   [name], [place], MD  traZODone  (DESYREL ) 50 MG tablet Take 1 tablet (50 mg total) by mouth at bedtime. 01/20/24   Singh, Prashant K, MD  zinc oxide 20 %  ointment Apply 1 Application topically in the morning and at bedtime.    [provider]    Physical Exam: BP 115/69   Pulse 76   Temp (!) 97.4 F (36.3 C)   Resp 18   SpO2 97%  General: Slightly disheveled and cachectic elderly man laying in bed. No acute distress. HEENT: [place]/AT. Anicteric sclera CV: RRR. III/VI systolic murmur heard best at LUSB. No LE edema Pulmonary: Lungs CTAB. Normal effort. No wheezing or rales. Abdominal: Soft, nontender, nondistended. Normal bowel sounds. Extremities: Decreased pedal pulse on the LLE. Normal ROM. Skin: Warm and dry. Large ~ 5 cm deep ulceration with foul odor on the lateral aspect of the left foot with granulation and fibrinous tissue, slight bone exposure and surrounding erythema, mild tenderness to palpation but no purulent drainage. Neuro: Alert and oriented to self, person and place but not to time or situation. Chronic right-sided hemiparesis. Contracted lower extremities. Normal sensation to light touch.  Psych: Normal mood and affect               Labs on Admission:  Basic Metabolic Panel: Recent Labs  Lab 06/23/24 1458  NA 135  K 4.4  CL 100  CO2 21*  GLUCOSE 155*  BUN 16  CREATININE 0.60*  CALCIUM  9.4   Liver Function Tests: Recent Labs  Lab 06/23/24 1458  AST 37  ALT 23  ALKPHOS 185*  BILITOT 0.3  PROT 7.3  ALBUMIN  3.2*   No results for input(s): LIPASE, AMYLASE in the last 168 hours. No results for input(s): AMMONIA in the last 168 hours. CBC: Recent Labs  Lab 06/23/24 1458  WBC 11.1*  NEUTROABS 8.8*  HGB 9.1*  HCT 28.9*  MCV 91.5  PLT 400   Cardiac Enzymes: No results for input(s): CKTOTAL, CKMB, CKMBINDEX, TROPONINI in the last 168 hours. BNP (last 3 results) No results for input(s): BNP in the last 8760 hours.  ProBNP (last 3 results) No results for input(s): PROBNP in the last 8760 hours.  CBG: No results for input(s): GLUCAP in the last 168  hours.  Radiological Exams on Admission: DG Foot Complete Left Result Date: 06/23/2024 CLINICAL DATA:  Pressure ulcer of left foot. EXAM: LEFT FOOT - COMPLETE 3+ VIEW COMPARISON:  None Available. FINDINGS: Mild diffuse decreased bone mineralization. Minimal degenerative changes of the first MTP joint. Mild degenerative changes over the  midfoot and hindfoot. No acute fracture or dislocation. Soft tissue changes over the plantar lateral aspect of the midfoot at the level of the base of the fifth metatarsal. Subtle lucency within the adjacent base of the fifth metatarsal which could be seen due to infection. No significant air in the soft tissues. IMPRESSION: Soft tissue changes over the plantar lateral aspect of the midfoot at the level of the base of the fifth metatarsal. Subtle lucency within the adjacent base of the fifth metatarsal which could be due to infection. Consider MRI for further evaluation. Electronically Signed   By: Toribio Agreste M.D.   On: 06/23/2024 15:48   Assessment/Plan Brandon [name] is a 58 y.o. male with medical history significant for CVA with residual right-sided hemiparesis, CAD s/p stenting in 2016, hypertension, hyperlipidemia, seizure disorder, prior history of smoking cigarette and alcohol  use, history of thoracic discitis with ventral epidural abscess in 12/2023 resulted in cord compression-completed 2 months of antibiotic (not a candidate for any procedure per IR and NSG), history of SVT, chronic hypotension, transaminitis, normocytic anemia, chronic pancreatitis with pancreatic insufficiency, BPH, insulin -dependent DM type II presenting from [facility] nursing home for evaluation of worsening left foot wound and admitted for diabetic foot ulcer with concern for osteomyelitis.  # Diabetic foot ulcer # Left foot pressure ulcer, POA # Concern for osteomyelitis - Relatively bedbound elderly diabetic man presented with worsening left foot pressure ulcer - Patient with  mild leukocytosis but afebrile and without any symptoms concerning for systemic infection - Initial x-ray of the left foot shows soft tissue changes over the plantar lateral aspect of the midfoot at the level of the base of the fifth metatarsal with subtle lucency - Due to clinical evaluation of the left foot wound and x-ray finding, there is a high suspicion for osteomyelitis - Check MRI left foot - Continue IV Zyvox and Unasyn - Continue IV NS at 125 cc/h - IV Dilaudid  as needed for pain - Follow-up blood cultures - Check inflammatory markers, trend CBC and fever curve - Wound care consulted, appreciate assistance  # Decreased peripheral pulses # ?PVD - Patient with faint left DP and PT pulses on exam - Due to his worsening left foot pressure ulcer and risk factors, concern for possible PVD - Check ABI  # Uncontrolled type 2 diabetes with hyperglycemia - Last A1c 9.4% 5 months ago - Blood glucose elevated to 155 on CMP - Continue Lantus  10 units daily - SSI with meals, CBG monitoring - Follow-up repeat A1c  # Chronic hypotension - SBP in the 80s-90s, not significantly different from baseline SBP in the 90s - Continue IV NS infusion as above - Continue midodrine   # Hx of CVA with hemiplegia - Nonambulatory, wheelchair-bound at baseline - Continue Plavix  and Crestor   # CAD s/p PCI - Denies any chest pain - Continue Plavix  and Crestor   # Hx of epidural abscess - Review of outpatient infectious disease note in June reveals that patient completed 8 weeks of IV antibiotics for polymicrobial epidural abscess - Inflammatory markers were normal so patient did not require any further oral antibiotics - CTM  # Chronic pancreatitis # Hx of transaminitis - Continue Creon  TID with meals  # Hx of seizure - Continue Keppra   # Mood disorder - Continue Zoloft  and BuSpar   # BPH - Continue Flomax   # GERD - Continue famotidine   # Insomnia - Continue mirtazapine and  melatonin  # Protein caloric malnutrition - Patient quite cachectic and frail  on exam - Check weight to calculate BMI - Continue protein supplementation  DVT prophylaxis: Lovenox      Code Status: Full Code  Consults called: None  Family Communication: Unable to reach sister over the phone  Severity of Illness: The appropriate patient status for this patient is INPATIENT. Inpatient status is judged to be reasonable and necessary in order to provide the required intensity of service to ensure the patient's safety. The patient's presenting symptoms, physical exam findings, and initial radiographic and laboratory data in the context of their chronic comorbidities is felt to place them at high risk for further clinical deterioration. Furthermore, it is not anticipated that the patient will be medically stable for discharge from the hospital within 2 midnights of admission.   * I certify that at the point of admission it is my clinical judgment that the patient will require inpatient hospital care spanning beyond 2 midnights from the point of admission due to high intensity of service, high risk for further deterioration and high frequency of surveillance required.*  Level of care: Telemetry   I personally spent a total of 77 minutes in the care of the patient today including preparing to see the patient, getting/reviewing separately obtained history, performing a medically appropriate exam/evaluation, placing orders, and documenting clinical information in the EHR.   Lou Claretta HERO, MD 06/23/2024, 7:48 PM [facility] Pager: [phone] Isaiah [phone]   If 7PM-7AM, please contact night-coverage [email or web address] Password [email or web address]

## 2024-06-23 NOTE — ED Triage Notes (Signed)
 Patient has a pressure ulcer on his left foot. Bone seen by facility [facility]). He complains of general pain. He is contracted and bed bound.Sores along legs and hips. A&O x2.    EMS Vitals: 100/72 BP 92 HR 99% SPO2 room air 243 CBG 99.6 Temp 18 RR

## 2024-06-23 NOTE — ED Notes (Signed)
 Pulse in left foot found using pencil doppler.

## 2024-06-23 NOTE — ED Notes (Signed)
 Patient refused in and out cath. External cath placed.

## 2024-06-24 ENCOUNTER — Inpatient Hospital Stay (HOSPITAL_COMMUNITY)

## 2024-06-24 DIAGNOSIS — L97909 Non-pressure chronic ulcer of unspecified part of unspecified lower leg with unspecified severity: Secondary | ICD-10-CM

## 2024-06-24 DIAGNOSIS — L97422 Non-pressure chronic ulcer of left heel and midfoot with fat layer exposed: Secondary | ICD-10-CM | POA: Diagnosis not present

## 2024-06-24 DIAGNOSIS — E11621 Type 2 diabetes mellitus with foot ulcer: Secondary | ICD-10-CM | POA: Diagnosis not present

## 2024-06-24 LAB — GLUCOSE, CAPILLARY
Glucose-Capillary: 169 mg/dL — ABNORMAL HIGH (ref 70–99)
Glucose-Capillary: 292 mg/dL — ABNORMAL HIGH (ref 70–99)
Glucose-Capillary: 278 mg/dL — ABNORMAL HIGH (ref 70–99)
Glucose-Capillary: 152 mg/dL — ABNORMAL HIGH (ref 70–99)

## 2024-06-24 LAB — HEMOGLOBIN A1C
Hgb A1c MFr Bld: 9.5 % — ABNORMAL HIGH (ref 4.8–5.6)
Mean Plasma Glucose: 225.95 mg/dL

## 2024-06-24 LAB — CBC
HCT: 26 % — ABNORMAL LOW (ref 39.0–52.0)
Hemoglobin: 8 g/dL — ABNORMAL LOW (ref 13.0–17.0)
MCH: 28.1 pg (ref 26.0–34.0)
MCHC: 30.8 g/dL (ref 30.0–36.0)
MCV: 91.2 fL (ref 80.0–100.0)
Platelets: 369 K/uL (ref 150–400)
RBC: 2.85 MIL/uL — ABNORMAL LOW (ref 4.22–5.81)
RDW: 14.6 % (ref 11.5–15.5)
WBC: 7.9 K/uL (ref 4.0–10.5)
nRBC: 0 % (ref 0.0–0.2)

## 2024-06-24 LAB — BASIC METABOLIC PANEL WITH GFR
Anion gap: 11 (ref 5–15)
BUN: 12 mg/dL (ref 6–20)
CO2: 21 mmol/L — ABNORMAL LOW (ref 22–32)
Calcium: 8.8 mg/dL — ABNORMAL LOW (ref 8.9–10.3)
Chloride: 103 mmol/L (ref 98–111)
Creatinine, Ser: 0.51 mg/dL — ABNORMAL LOW (ref 0.61–1.24)
GFR, Estimated: >60 mL/min (ref >60)
Glucose, Bld: 222 mg/dL — ABNORMAL HIGH (ref 70–99)
Potassium: 3.7 mmol/L (ref 3.5–5.1)
Sodium: 135 mmol/L (ref 135–145)

## 2024-06-24 LAB — VAS US ABI WITH/WO TBI
Left ABI: 0.58
Right ABI: 0.91

## 2024-06-24 LAB — SEDIMENTATION RATE: Sed Rate: 130 mm/h — ABNORMAL HIGH (ref 0–16)

## 2024-06-24 LAB — C-REACTIVE PROTEIN: CRP: 16.2 mg/dL — ABNORMAL HIGH (ref <1.0)

## 2024-06-24 MED ORDER — GADOBUTROL 1 MMOL/ML IV SOLN
6.0000 mL | Freq: Once | INTRAVENOUS | Status: AC | PRN
  Administered 2024-06-24: 6 mL via INTRAVENOUS

## 2024-06-24 NOTE — Plan of Care (Signed)
  Problem: Skin Integrity: Goal: Risk for impaired skin integrity will decrease Outcome: Not Progressing   Problem: Tissue Perfusion: Goal: Adequacy of tissue perfusion will improve Outcome: Not Progressing   Problem: Education: Goal: Knowledge of General Education information will improve Description: Including pain rating scale, medication(s)/side effects and non-pharmacologic comfort measures Outcome: Not Progressing

## 2024-06-24 NOTE — Progress Notes (Signed)
 [facility] Progress Note  Patient: Brandon [name]    FMW:[id number]  DOA: 06/23/2024     Date of Service: the patient was seen and examined on 06/24/2024  Chief Complaint  Patient presents with   Wound Check   Generalized Body Aches   Brief hospital course: Brandon [name] is a 58 y.o. male with medical history significant for CVA with residual right-sided hemiparesis, CAD s/p stenting in 2016, hypertension, hyperlipidemia, seizure disorder, prior history of smoking cigarette and alcohol  use, history of thoracic discitis with ventral epidural abscess in 12/2023 resulted in cord compression-completed 2 months of antibiotic (not a candidate for any procedure per IR and NSG), history of SVT, chronic hypotension, transaminitis, normocytic anemia, chronic pancreatitis with pancreatic insufficiency, BPH, insulin -dependent DM type II presenting from [facility] nursing home for evaluation of worsening left foot wound. Per EMS, they were called to nursing home facility due to patient having worsening left foot pressure ulcer but has begun to show some bone. On evaluation, patient reports increasing left foot pain over the last week and a foot wound that is not healing appropriately. He denies any fevers, chills, nausea, vomiting, abdominal pain, chest pain, shortness of breath, numbness or tingling.  Patient overall poor historian and not oriented to time or situation (unclear baseline).    ED Course: Initial vitals show temp 97.5, RR 19, HR 74, SBP 90-100s, SpO2 98% on room air. Initial labs significant for WBC 11.1, Hgb 9.1, blood glucose 155, alk phos 185, albumin  3.2, lactic acid 1.3.  X-ray of the left foot shows subtle lucency within the adjacent base of the fifth metatarsal which could be due to infection. EDP asked to place order for MRI of the left foot due to my concern for possible osteomyelitis. Pt received IV NS 1 L bolus, IV Unasyn and oral Zyvox. TRH was consulted for admission.     Assessment and Plan:   # Diabetic foot ulcer # Left foot pressure ulcer, POA # Concern for osteomyelitis - Relatively bedbound elderly diabetic man presented with worsening left foot pressure ulcer - Patient with mild leukocytosis but afebrile and without any symptoms concerning for systemic infection - Initial x-ray of the left foot shows soft tissue changes over the plantar lateral aspect of the midfoot at the level of the base of the fifth metatarsal with subtle lucency - Due to clinical evaluation of the left foot wound and x-ray finding, there is a high suspicion for osteomyelitis - Check MRI left foot - Continue IV Zyvox and Unasyn - Continue IV NS at 125 cc/h - IV Dilaudid  as needed for pain - Follow-up blood cultures - Check inflammatory markers, trend CBC and fever curve - Wound care consulted, appreciate assistance   # Decreased peripheral pulses # ?PVD - Patient with faint left DP and PT pulses on exam - Due to his worsening left foot pressure ulcer and risk factors, concern for possible PVD - Check ABI   # Uncontrolled type 2 diabetes with hyperglycemia - Last A1c 9.4% 5 months ago - Blood glucose elevated to 155 on CMP - Continue Lantus  10 units daily - SSI with meals, CBG monitoring - Follow-up repeat A1c   # Chronic hypotension - SBP in the 80s-90s, not significantly different from baseline SBP in the 90s - Continue IV NS infusion as above - Continue midodrine    # Hx of CVA with hemiplegia - Nonambulatory, wheelchair-bound at baseline - Continue Plavix  and Crestor    # CAD s/p PCI -  Denies any chest pain - Continue Plavix  and Crestor    # Hx of epidural abscess - Review of outpatient infectious disease note in June reveals that patient completed 8 weeks of IV antibiotics for polymicrobial epidural abscess - Inflammatory markers were normal so patient did not require any further oral antibiotics - CTM   # Chronic pancreatitis # Hx of transaminitis -  Continue Creon  TID with meals   # Hx of seizure - Continue Keppra    # Mood disorder - Continue Zoloft  and BuSpar    # BPH - Continue Flomax    # GERD - Continue famotidine    # Insomnia - Continue mirtazapine and melatonin   # Protein caloric malnutrition - Patient quite cachectic and frail on exam - Check weight to calculate BMI - Continue protein supplementation  Body mass index is 18.21 kg/m.  Interventions:  Diet: Medium carb modified diet DVT Prophylaxis: Subcutaneous Lovenox    Advance goals of care discussion: Full code  Family Communication: family was not present at bedside, at the time of interview.  The pt provided permission to discuss medical plan with the family. Opportunity was given to ask question and all questions were answered satisfactorily.   Disposition:  Pt is from SNF, admitted with DM foot ulcer, still on IV antibiotics and workup pending, which precludes a safe discharge. Discharge to SNF, when stable, may need few days to improve.  Subjective: No significant events overnight.  Patient was complaining of pain 12/10, improving after pain medication.  Denied any other complaints.  Physical Exam: General: NAD, lying comfortably Appear in no distress, affect appropriate Eyes: PERRLA ENT: Oral Mucosa Clear, moist  Neck: no JVD,  Cardiovascular: S1 and S2 Present, no Murmur,  Respiratory: good respiratory effort, Bilateral Air entry equal and Decreased, no Crackles, no wheezes Abdomen: Bowel Sound present, Soft and no tenderness,  Skin: Large ~ 5 cm deep ulceration with foul odor on the lateral aspect of the left foot with granulation and fibrinous tissue, slight bone exposure and surrounding erythema, mild tenderness to palpation but no purulent drainage.  Extremities: left foot DM ulcer as above Neurologic: Chronic left sided hemiparesis, with contracted B/L lower extremities and left arm stiffness. Right arm is normal   AAOx 3 Gait: Bedbound due  to above  Vitals:   06/24/24 0555 06/24/24 1030 06/24/24 1033 06/24/24 1415  BP: 96/63 (!) 84/66 96/62 116/76  Pulse: 71 75  65  Resp: 16 18  18   Temp: 97.7 F (36.5 C) 97.8 F (36.6 C)  97.8 F (36.6 C)  TempSrc: Oral Oral  Oral  SpO2: 99% (!) 85% 90% 100%  Weight:        Intake/Output Summary (Last 24 hours) at 06/24/2024 1516 Last data filed at 06/24/2024 1400 Gross per 24 hour  Intake 1000 ml  Output 600 ml  Net 400 ml   Filed Weights   06/24/24 0500  Weight: 60.9 kg    Data Reviewed: I have personally reviewed and interpreted daily labs, tele strips, imagings as discussed above. I reviewed all nursing notes, pharmacy notes, vitals, pertinent old records I have discussed plan of care as described above with RN and patient/family.  CBC: Recent Labs  Lab 06/23/24 1458 06/24/24 0437  WBC 11.1* 7.9  NEUTROABS 8.8*  --   HGB 9.1* 8.0*  HCT 28.9* 26.0*  MCV 91.5 91.2  PLT 400 369   Basic Metabolic Panel: Recent Labs  Lab 06/23/24 1458 06/24/24 0437  NA 135 135  K 4.4 3.7  CL 100 103  CO2 21* 21*  GLUCOSE 155* 222*  BUN 16 12  CREATININE 0.60* 0.51*  CALCIUM  9.4 8.8*    Studies: VAS US  ABI WITH/WO TBI Result Date: 06/24/2024  LOWER EXTREMITY DOPPLER STUDY Patient Name:  Brandon [name]  Date of Exam:   06/24/2024 Medical Rec #: [id number]         Accession #:    [id number] Date of Birth: 1966-06-16         Patient Gender: M Patient Age:   22 years Exam Location:  [facility] Procedure:      VAS US  ABI WITH/WO TBI Referring Phys: --------------------------------------------------------------------------------  Indications: Left stage IV pressure wound with visualized bone, left lateral              foot. High Risk Factors: Hypertension, hyperlipidemia, Diabetes, past history of                    smoking, coronary artery disease, stenting 2016, prior CVA                    with residual hemiplegia, wheelchair/bed bound. Other Factors: History of ETOH  abuse.  Vascular Interventions: Carotid artery disease, status post left carotid stent                         03/2015, right ICA occlusion. Bilateral renal artery                         stenosis. Limitations: Today's exam was limited due to Contracture and bandages. Patient              not compliant with full exam. Comparison Study: No prior ABI on file Performing Technologist: Rachel Pellet RVS  Examination Guidelines: A complete evaluation includes at minimum, Doppler waveform signals and systolic blood pressure reading at the level of bilateral brachial, anterior tibial, and posterior tibial arteries, when vessel segments are accessible. Bilateral testing is considered an integral part of a complete examination. Photoelectric Plethysmograph (PPG) waveforms and toe systolic pressure readings are included as required and additional duplex testing as needed. Limited examinations for reoccurring indications may be performed as noted.  ABI Findings: +---------+------------------+-----+----------+-----------------------+ Right    Rt Pressure (mmHg)IndexWaveform  Comment                 +---------+------------------+-----+----------+-----------------------+ Brachial                                  Contracture/positioning +---------+------------------+-----+----------+-----------------------+ PTA      81                0.74 monophasic                        +---------+------------------+-----+----------+-----------------------+ DP       100               0.91 monophasic                        +---------+------------------+-----+----------+-----------------------+ Great Toe49                0.45 Abnormal                          +---------+------------------+-----+----------+-----------------------+ +---------+------------------+-----+-------------------+--------------------+ Left  Lt Pressure (mmHg)IndexWaveform           Comment               +---------+------------------+-----+-------------------+--------------------+ Brachial 110                    monophasic                              +---------+------------------+-----+-------------------+--------------------+ PTA                                                bandages/contracture +---------+------------------+-----+-------------------+--------------------+ PERO                                               bandages/contracture +---------+------------------+-----+-------------------+--------------------+ DP       64                0.58 dampened monophasic                     +---------+------------------+-----+-------------------+--------------------+ Great Toe0                 0.00 Absent                                  +---------+------------------+-----+-------------------+--------------------+ +-------+-----------+-----------+------------+------------+ ABI/TBIToday's ABIToday's TBIPrevious ABIPrevious TBI +-------+-----------+-----------+------------+------------+ Right  0.91       0.45                                +-------+-----------+-----------+------------+------------+ Left   0.58       absent                              +-------+-----------+-----------+------------+------------+   Summary: Right: The right toe-brachial index is abnormal. Although ankle brachial indices are mildly reduced (0.80-0.94), arterial Doppler waveforms at the ankle suggest some component of arterial occlusive disease. Left: Resting left ankle-brachial index indicates moderate to severe left lower extremity arterial disease. The left toe-brachial index is abnormal.  *See table(s) above for measurements and observations.     Preliminary    DG Foot Complete Left Result Date: 06/23/2024 CLINICAL DATA:  Pressure ulcer of left foot. EXAM: LEFT FOOT - COMPLETE 3+ VIEW COMPARISON:  None Available. FINDINGS: Mild diffuse decreased bone mineralization. Minimal  degenerative changes of the first MTP joint. Mild degenerative changes over the midfoot and hindfoot. No acute fracture or dislocation. Soft tissue changes over the plantar lateral aspect of the midfoot at the level of the base of the fifth metatarsal. Subtle lucency within the adjacent base of the fifth metatarsal which could be seen due to infection. No significant air in the soft tissues. IMPRESSION: Soft tissue changes over the plantar lateral aspect of the midfoot at the level of the base of the fifth metatarsal. Subtle lucency within the adjacent base of the fifth metatarsal which could be due to infection. Consider MRI for further evaluation. Electronically Signed   By: Toribio Agreste M.D.   On: 06/23/2024 15:48    Scheduled Meds:  ascorbic acid   500 mg Oral Daily   baclofen  20 mg Oral TID   busPIRone   10 mg Oral BID   clopidogrel   75 mg Oral Daily   enoxaparin  (LOVENOX ) injection  40 mg Subcutaneous Q24H   famotidine   20 mg Oral BID   feeding supplement (GLUCERNA SHAKE)  1 Container Oral TID BM   ferrous sulfate   325 mg Oral Q breakfast   insulin  aspart  0-5 Units Subcutaneous QHS   insulin  aspart  0-9 Units Subcutaneous TID WC   insulin  glargine  10 Units Subcutaneous Daily   levETIRAcetam   500 mg Oral BID   lipase/protease/amylase  12,000 Units Oral TID WC   melatonin  3 mg Oral QHS   midodrine   10 mg Oral TID WC   mirtazapine  7.5 mg Oral QHS   multivitamin with minerals  1 tablet Oral Daily   mupirocin  ointment  1 Application Topical Daily   rosuvastatin   5 mg Oral QHS   sertraline   100 mg Oral Daily   tamsulosin   0.4 mg Oral QHS   Continuous Infusions:  ampicillin-sulbactam (UNASYN) IV 3 g (06/24/24 0922)   linezolid (ZYVOX) IV 600 mg (06/24/24 1044)   PRN Meds: acetaminophen  **OR** acetaminophen , bisacodyl , HYDROmorphone  (DILAUDID ) injection, ondansetron  **OR** ondansetron  (ZOFRAN ) IV, oxyCODONE , senna-docusate  Time spent: 35 minutes  Author: ELVAN SOR. MD [facility] 06/24/2024 3:16 PM  To reach On-call, see care teams to locate the attending and reach out to them via [email or web address]. If 7PM-7AM, please contact night-coverage If you still have difficulty reaching the attending provider, please page the [facility] (Director on Call) for [facility] on amion for assistance.

## 2024-06-24 NOTE — Progress Notes (Signed)
 Upon arrival to the floor, patient began using profanity with staff repeatedly. When asked to stop using profanity patient continued. This Clinical research associate educated patient that language would not continued to be tolerated. Patient stated  I understand. Plan of care ongoing.

## 2024-06-24 NOTE — Progress Notes (Signed)
 VASCULAR LAB    ABI has been performed.  See CV proc for preliminary results.   Shaneequa Bahner, RVT 06/24/2024, 11:42 AM

## 2024-06-24 NOTE — Plan of Care (Signed)

## 2024-06-24 NOTE — Progress Notes (Signed)
 Patient refused to have his gown changed.

## 2024-06-25 DIAGNOSIS — I70292 Other atherosclerosis of native arteries of extremities, left leg: Secondary | ICD-10-CM | POA: Diagnosis not present

## 2024-06-25 DIAGNOSIS — M869 Osteomyelitis, unspecified: Secondary | ICD-10-CM | POA: Diagnosis not present

## 2024-06-25 DIAGNOSIS — L97422 Non-pressure chronic ulcer of left heel and midfoot with fat layer exposed: Secondary | ICD-10-CM | POA: Diagnosis not present

## 2024-06-25 DIAGNOSIS — Z8673 Personal history of transient ischemic attack (TIA), and cerebral infarction without residual deficits: Secondary | ICD-10-CM

## 2024-06-25 DIAGNOSIS — E11621 Type 2 diabetes mellitus with foot ulcer: Secondary | ICD-10-CM | POA: Diagnosis not present

## 2024-06-25 DIAGNOSIS — I2510 Atherosclerotic heart disease of native coronary artery without angina pectoris: Secondary | ICD-10-CM | POA: Diagnosis not present

## 2024-06-25 DIAGNOSIS — Z9889 Other specified postprocedural states: Secondary | ICD-10-CM

## 2024-06-25 DIAGNOSIS — M7989 Other specified soft tissue disorders: Secondary | ICD-10-CM | POA: Diagnosis not present

## 2024-06-25 DIAGNOSIS — I10 Essential (primary) hypertension: Secondary | ICD-10-CM

## 2024-06-25 DIAGNOSIS — E785 Hyperlipidemia, unspecified: Secondary | ICD-10-CM

## 2024-06-25 LAB — PHOSPHORUS: Phosphorus: 3.3 mg/dL (ref 2.5–4.6)

## 2024-06-25 LAB — GLUCOSE, CAPILLARY
Glucose-Capillary: 140 mg/dL — ABNORMAL HIGH (ref 70–99)
Glucose-Capillary: 179 mg/dL — ABNORMAL HIGH (ref 70–99)
Glucose-Capillary: 135 mg/dL — ABNORMAL HIGH (ref 70–99)
Glucose-Capillary: 77 mg/dL (ref 70–99)

## 2024-06-25 LAB — BASIC METABOLIC PANEL WITH GFR
Anion gap: 11 (ref 5–15)
BUN: 5 mg/dL — ABNORMAL LOW (ref 6–20)
CO2: 23 mmol/L (ref 22–32)
Calcium: 9.1 mg/dL (ref 8.9–10.3)
Chloride: 104 mmol/L (ref 98–111)
Creatinine, Ser: 0.41 mg/dL — ABNORMAL LOW (ref 0.61–1.24)
GFR, Estimated: >60 mL/min (ref >60)
Glucose, Bld: 151 mg/dL — ABNORMAL HIGH (ref 70–99)
Potassium: 3.7 mmol/L (ref 3.5–5.1)
Sodium: 139 mmol/L (ref 135–145)

## 2024-06-25 LAB — CBC
HCT: 26.6 % — ABNORMAL LOW (ref 39.0–52.0)
Hemoglobin: 8.2 g/dL — ABNORMAL LOW (ref 13.0–17.0)
MCH: 28.4 pg (ref 26.0–34.0)
MCHC: 30.8 g/dL (ref 30.0–36.0)
MCV: 92 fL (ref 80.0–100.0)
Platelets: 374 K/uL (ref 150–400)
RBC: 2.89 MIL/uL — ABNORMAL LOW (ref 4.22–5.81)
RDW: 14.6 % (ref 11.5–15.5)
WBC: 7.8 K/uL (ref 4.0–10.5)
nRBC: 0 % (ref 0.0–0.2)

## 2024-06-25 LAB — MAGNESIUM: Magnesium: 1.7 mg/dL (ref 1.7–2.4)

## 2024-06-25 MED ORDER — SODIUM CHLORIDE 0.9 % IV BOLUS
500.0000 mL | Freq: Once | INTRAVENOUS | Status: AC
  Administered 2024-06-25: 500 mL via INTRAVENOUS

## 2024-06-25 MED ORDER — SODIUM CHLORIDE 0.9 % IV SOLN
INTRAVENOUS | Status: AC
Start: 2024-06-25 — End: 2024-06-26

## 2024-06-25 NOTE — Plan of Care (Signed)
  Problem: Fluid Volume: Goal: Ability to maintain a balanced intake and output will improve Outcome: Progressing   Problem: Nutritional: Goal: Maintenance of adequate nutrition will improve Outcome: Progressing   Problem: Coping: Goal: Ability to adjust to condition or change in health will improve Outcome: Not Progressing

## 2024-06-25 NOTE — Progress Notes (Signed)
 [facility] Progress Note  Patient: Brandon [name]    FMW:[id number]  DOA: 06/23/2024     Date of Service: the patient was seen and examined on 06/25/2024  Chief Complaint  Patient presents with   Wound Check   Generalized Body Aches   Brief hospital course: Brandon [name] is a 58 y.o. male with medical history significant for CVA with residual right-sided hemiparesis, CAD s/p stenting in 2016, hypertension, hyperlipidemia, seizure disorder, prior history of smoking cigarette and alcohol  use, history of thoracic discitis with ventral epidural abscess in 12/2023 resulted in cord compression-completed 2 months of antibiotic (not a candidate for any procedure per IR and NSG), history of SVT, chronic hypotension, transaminitis, normocytic anemia, chronic pancreatitis with pancreatic insufficiency, BPH, insulin -dependent DM type II presenting from [facility] nursing home for evaluation of worsening left foot wound. Per EMS, they were called to nursing home facility due to patient having worsening left foot pressure ulcer but has begun to show some bone. On evaluation, patient reports increasing left foot pain over the last week and a foot wound that is not healing appropriately. He denies any fevers, chills, nausea, vomiting, abdominal pain, chest pain, shortness of breath, numbness or tingling.  Patient overall poor historian and not oriented to time or situation (unclear baseline).    ED Course: Initial vitals show temp 97.5, RR 19, HR 74, SBP 90-100s, SpO2 98% on room air. Initial labs significant for WBC 11.1, Hgb 9.1, blood glucose 155, alk phos 185, albumin  3.2, lactic acid 1.3.  X-ray of the left foot shows subtle lucency within the adjacent base of the fifth metatarsal which could be due to infection. EDP asked to place order for MRI of the left foot due to my concern for possible osteomyelitis. Pt received IV NS 1 L bolus, IV Unasyn and oral Zyvox. TRH was consulted for admission.     Assessment and Plan:   # Left fifth metatarsal osteomyelitis  # diabetic foot ulcer # Left foot pressure ulcer, POA - Relatively bedbound elderly diabetic man presented with worsening left foot pressure ulcer - Patient with mild leukocytosis but afebrile and without any symptoms concerning for systemic infection - X-ray left foot shows soft tissue changes over the plantar lateral aspect of the midfoot at the level of the base of the fifth metatarsal with subtle lucency MRI: Suspected acute OM proximal fifth metatarsal, possible early OM versus reactive edema adjacent cuboid and base of fourth metatarsal with soft tissue gas.  - Continue IV Zyvox and Unasyn - Continue IV NS at 125 cc/h - IV Dilaudid  as needed for pain - Follow-up blood cultures - Wound care consulted, appreciate assistance 10/3 discussed with vascular surgery, recommended to transfer to [facility] for possible amputation. So patient will be transferred to [facility] and [facility] will follow at [facility]   # Decreased peripheral pulses # PVD - Patient with faint left DP and PT pulses on exam - Due to his worsening left foot pressure ulcer and risk factors, concern for possible PVD - ABI: Positive for PAD bilateral lower extremities, more worse on the left side   # Uncontrolled type 2 diabetes with hyperglycemia - Last A1c 9.4% 5 months ago - Blood glucose elevated to 155 on CMP - Continue Lantus  10 units daily - SSI with meals, CBG monitoring - Follow-up repeat A1c   # Chronic hypotension - SBP in the 80s-90s, not significantly different from baseline SBP in the 90s - Continue IVF NS - Continue  midodrine  10/3 low BP, NS 500 mL bolus given  # Hx of CVA with left hemiplegia and bilateral lower extremity contracture - Nonambulatory, wheelchair-bound at baseline - Continue Plavix  and Crestor    # CAD s/p PCI - Denies any chest pain - Continue Plavix  and Crestor    # Hx of epidural abscess - Review of outpatient  infectious disease note in June reveals that patient completed 8 weeks of IV antibiotics for polymicrobial epidural abscess - Inflammatory markers were normal so patient did not require any further oral antibiotics - CTM   # Chronic pancreatitis # Hx of transaminitis - Continue Creon  TID with meals   # Hx of seizure - Continue Keppra    # Mood disorder - Continue Zoloft  and BuSpar    # BPH - Continue Flomax    # GERD - Continue famotidine    # Insomnia - Continue mirtazapine and melatonin   # Protein caloric malnutrition - Patient quite cachectic and frail on exam - Check weight to calculate BMI - Continue protein supplementation  Body mass index is 18.21 kg/m.  Interventions:  Diet: Medium carb modified diet DVT Prophylaxis: Subcutaneous Lovenox    Advance goals of care discussion: Full code  Family Communication: family was not present at bedside, at the time of interview.  The pt provided permission to discuss medical plan with the family. Opportunity was given to ask question and all questions were answered satisfactorily.   Disposition:  Pt is from SNF, admitted with left foot OM and DM foot ulcer, still on IV antibiotics and workup pending, which precludes a safe discharge. Discharge to SNF, when stable, may need few days to improve. 10/3 transferred to [facility] as per vascular surgery for possible amputation of left foot.  Subjective: No significant events overnight.  Patient was lying comfortably, still has pain which is controlled with pain medications. Patient was informed about MRI report and transferred to [facility].  He agreed with the transfer and to be seen by vascular surgery over there.   Physical Exam: General: NAD, lying comfortably Appear in no distress, affect appropriate Eyes: PERRLA ENT: Oral Mucosa Clear, moist  Neck: no JVD,  Cardiovascular: S1 and S2 Present, no Murmur,  Respiratory: good respiratory effort, Bilateral Air entry equal and  Decreased, no Crackles, no wheezes Abdomen: Bowel Sound present, Soft and no tenderness,  Skin: Large ~ 5 cm deep ulceration with foul odor on the lateral aspect of the left foot with granulation and fibrinous tissue, slight bone exposure and surrounding erythema, mild tenderness to palpation but no purulent drainage.  Extremities: left foot DM ulcer as above Neurologic: Chronic left sided hemiparesis, with contracted B/L lower extremities and left arm stiffness. Right arm is normal   AAOx 3 Gait: Bedbound due to above  Vitals:   06/24/24 1033 06/24/24 1415 06/24/24 2054 06/25/24 0617  BP: 96/62 116/76 119/68 (!) 95/59  Pulse:  65 64 74  Resp:  18 17 16   Temp:  97.8 F (36.6 C) 97.8 F (36.6 C) 98.2 F (36.8 C)  TempSrc:  Oral Oral Oral  SpO2: 90% 100% 100% 98%  Weight:        Intake/Output Summary (Last 24 hours) at 06/25/2024 1019 Last data filed at 06/25/2024 0600 Gross per 24 hour  Intake 3668.81 ml  Output 1600 ml  Net 2068.81 ml   Filed Weights   06/24/24 0500  Weight: 60.9 kg    Data Reviewed: I have personally reviewed and interpreted daily labs, tele strips, imagings as discussed above.  I reviewed all nursing notes, pharmacy notes, vitals, pertinent old records I have discussed plan of care as described above with RN and patient/family.  CBC: Recent Labs  Lab 06/23/24 1458 06/24/24 0437 06/25/24 0431  WBC 11.1* 7.9 7.8  NEUTROABS 8.8*  --   --   HGB 9.1* 8.0* 8.2*  HCT 28.9* 26.0* 26.6*  MCV 91.5 91.2 92.0  PLT 400 369 374   Basic Metabolic Panel: Recent Labs  Lab 06/23/24 1458 06/24/24 0437 06/25/24 0431  NA 135 135 139  K 4.4 3.7 3.7  CL 100 103 104  CO2 21* 21* 23  GLUCOSE 155* 222* 151*  BUN 16 12 5*  CREATININE 0.60* 0.51* 0.41*  CALCIUM  9.4 8.8* 9.1  MG  --   --  1.7  PHOS  --   --  3.3    Studies: MR FOOT LEFT W WO CONTRAST Result Date: 06/24/2024 EXAM: MRI of the Left Foot with and without contrast. 06/24/2024 06:50:54 PM  TECHNIQUE: Multiplanar multisequence MRI of the left foot was performed with and without the administration of intravenous contrast. Gadobutrol  (GADAVIST ) 1 MMOL/ML injection 6 mL was administered. Reduced sensitivity due to patient motion and difficulty with positioning due to patient contraction. COMPARISON: None available. CLINICAL HISTORY: Soft tissue infection suspected, concerns for osteomyelitis, wound on lateral aspect of left foot, history of diabetes. FINDINGS: LISFRANC JOINT: Visualized Lisfranc ligament is intact. No significant Lisfranc interval widening or significant periligamentous edema. BONE MARROW: Marrow edema and enhancement proximally in the 5th metatarsal with some cortical indistinctness suspicious for active osteomyelitis. Endosteal edema in the adjacent cuboid anteriorly and laterally may be reactive or may reflect early osteomyelitis. Similarly, endosteal edema along the base of the 4th metatarsal could be from reactive edema or early osteomyelitis. GREATER AND LESSER MTP JOINTS: No significant joint effusion or osseous erosions. No significant degenerative changes. Normal alignment. SOFT TISSUES: Suspected cutaneous ulceration overlying the base of the 5th metatarsal, potentially with some gas in the adjacent soft tissues. Trace diffuse regional muscular edema, possibly neurogenic. Dorsal subcutaneous edema in the forefoot, nonspecific for cellulitis versus sterile edema. TENDONS: Visualized flexor and extensor tendons are intact without tenosynovitis. IMPRESSION: 1. Active osteomyelitis suspected in the proximal 5th metatarsal. 2. Possible early osteomyelitis versus reactive endosteal edema in the adjacent cuboid and base of the 4th metatarsal. 3. Suspected cutaneous ulceration over the base of the 5th metatarsal with possible adjacent soft tissue gas. 4. Trace diffuse regional muscular edema, possibly neurogenic. 5. Dorsal forefoot subcutaneous edema, nonspecific for cellulitis versus  sterile edema. Electronically signed by: Ryan Salvage MD 06/24/2024 07:01 PM EDT RP Workstation: [id number]   VAS US  ABI WITH/WO TBI Result Date: 06/24/2024  LOWER EXTREMITY DOPPLER STUDY Patient Name:  Brandon [name]  Date of Exam:   06/24/2024 Medical Rec #: [id number]         Accession #:    [id number] Date of Birth: 11/07/1965         Patient Gender: M Patient Age:   67 years Exam Location:  [facility] Procedure:      VAS US  ABI WITH/WO TBI Referring Phys: --------------------------------------------------------------------------------  Indications: Left stage IV pressure wound with visualized bone, left lateral              foot. High Risk Factors: Hypertension, hyperlipidemia, Diabetes, past history of                    smoking, coronary artery disease, stenting 2016, prior CVA  with residual hemiplegia, wheelchair/bed bound. Other Factors: History of ETOH abuse.  Vascular Interventions: Carotid artery disease, status post left carotid stent                         03/2015, right ICA occlusion. Bilateral renal artery                         stenosis. Limitations: Today's exam was limited due to Contracture and bandages. Patient              not compliant with full exam. Comparison Study: No prior ABI on file Performing Technologist: Rachel Pellet RVS  Examination Guidelines: A complete evaluation includes at minimum, Doppler waveform signals and systolic blood pressure reading at the level of bilateral brachial, anterior tibial, and posterior tibial arteries, when vessel segments are accessible. Bilateral testing is considered an integral part of a complete examination. Photoelectric Plethysmograph (PPG) waveforms and toe systolic pressure readings are included as required and additional duplex testing as needed. Limited examinations for reoccurring indications may be performed as noted.  ABI Findings: +---------+------------------+-----+----------+-----------------------+  Right    Rt Pressure (mmHg)IndexWaveform  Comment                 +---------+------------------+-----+----------+-----------------------+ Brachial                                  Contracture/positioning +---------+------------------+-----+----------+-----------------------+ PTA      81                0.74 monophasic                        +---------+------------------+-----+----------+-----------------------+ DP       100               0.91 monophasic                        +---------+------------------+-----+----------+-----------------------+ Great Toe49                0.45 Abnormal                          +---------+------------------+-----+----------+-----------------------+ +---------+------------------+-----+-------------------+--------------------+ Left     Lt Pressure (mmHg)IndexWaveform           Comment              +---------+------------------+-----+-------------------+--------------------+ Brachial 110                    monophasic                              +---------+------------------+-----+-------------------+--------------------+ PTA                                                bandages/contracture +---------+------------------+-----+-------------------+--------------------+ PERO                                               bandages/contracture +---------+------------------+-----+-------------------+--------------------+ DP       64  0.58 dampened monophasic                     +---------+------------------+-----+-------------------+--------------------+ Great Toe0                 0.00 Absent                                  +---------+------------------+-----+-------------------+--------------------+ +-------+-----------+-----------+------------+------------+ ABI/TBIToday's ABIToday's TBIPrevious ABIPrevious TBI +-------+-----------+-----------+------------+------------+ Right  0.91       0.45                                 +-------+-----------+-----------+------------+------------+ Left   0.58       absent                              +-------+-----------+-----------+------------+------------+  Summary: Right: The right toe-brachial index is abnormal. Although ankle brachial indices are mildly reduced (0.80-0.94), arterial Doppler waveforms at the ankle suggest some component of arterial occlusive disease. Left: Resting left ankle-brachial index indicates moderate to severe left lower extremity arterial disease. The left toe-brachial index is abnormal.  *See table(s) above for measurements and observations.  Electronically signed by Debby Robertson on 06/24/2024 at 5:39:33 PM.    Final     Scheduled Meds:  ascorbic acid   500 mg Oral Daily   baclofen  20 mg Oral TID   busPIRone   10 mg Oral BID   clopidogrel   75 mg Oral Daily   enoxaparin  (LOVENOX ) injection  40 mg Subcutaneous Q24H   famotidine   20 mg Oral BID   feeding supplement (GLUCERNA SHAKE)  1 Container Oral TID BM   ferrous sulfate   325 mg Oral Q breakfast   insulin  aspart  0-5 Units Subcutaneous QHS   insulin  aspart  0-9 Units Subcutaneous TID WC   insulin  glargine  10 Units Subcutaneous Daily   levETIRAcetam   500 mg Oral BID   lipase/protease/amylase  12,000 Units Oral TID WC   melatonin  3 mg Oral QHS   midodrine   10 mg Oral TID WC   mirtazapine  7.5 mg Oral QHS   multivitamin with minerals  1 tablet Oral Daily   mupirocin  ointment  1 Application Topical Daily   rosuvastatin   5 mg Oral QHS   sertraline   100 mg Oral Daily   tamsulosin   0.4 mg Oral QHS   Continuous Infusions:  ampicillin-sulbactam (UNASYN) IV 3 g (06/25/24 1009)   linezolid (ZYVOX) IV 600 mg (06/24/24 2259)   PRN Meds: acetaminophen  **OR** acetaminophen , bisacodyl , HYDROmorphone  (DILAUDID ) injection, ondansetron  **OR** ondansetron  (ZOFRAN ) IV, oxyCODONE , senna-docusate  Time spent: 40 minutes  Author: ELVAN SOR. MD [facility] 06/25/2024  10:19 AM  To reach On-call, see care teams to locate the attending and reach out to them via [email or web address]. If 7PM-7AM, please contact night-coverage If you still have difficulty reaching the attending provider, please page the [facility] (Director on Call) for [facility] on amion for assistance.

## 2024-06-25 NOTE — TOC Initial Note (Signed)
 Transition of Care [facility]) - Initial/Assessment Note    Patient Details  Name: Brandon [name] MRN: [id number] Date of Birth: 1965-12-11  Transition of [facility]) CM/SW Contact:    NORMAN ASPEN, LCSW Phone Number: 06/25/2024, 11:23 AM  Clinical Narrative:                  Met briefly with pt who is oriented x1 only today.  Have confirmed with facility that pt is a LTC resident at [facility] and they will accept back when medically ready.  Plan now for pt to be transferred to [facility] for further medical work up/ treatment.  IP CM will continue to follow and assist with dc needs when appropriate.     Expected Discharge Plan: Long Term Nursing Home Barriers to Discharge: Continued Medical Work up   Patient Goals and CMS Choice Patient states their goals for this hospitalization and ongoing recovery are:: put me back in bed (in the bed)          Expected Discharge Plan and Services In-house Referral: Clinical Social Work     Living arrangements for the past 2 months: Skilled Nursing Facility                 DME Arranged: N/A DME Agency: NA                  Prior Living Arrangements/Services Living arrangements for the past 2 months: Skilled Nursing Facility Lives with:: Facility Resident Patient language and need for interpreter reviewed:: Yes        Need for Family Participation in Patient Care: No (Comment) Care giver support system in place?: Yes (comment)   Criminal Activity/Legal Involvement Pertinent to Current Situation/Hospitalization: No - Comment as needed  Activities of Daily Living   ADL Screening (condition at time of admission) Independently performs ADLs?: No Does the patient have a NEW difficulty with bathing/dressing/toileting/self-feeding that is expected to last >3 days?: No Does the patient have a NEW difficulty with getting in/out of bed, walking, or climbing stairs that is expected to last >3 days?: No Does the patient have a NEW  difficulty with communication that is expected to last >3 days?: No Is the patient deaf or have difficulty hearing?: No Does the patient have difficulty seeing, even when wearing glasses/contacts?: No Does the patient have difficulty concentrating, remembering, or making decisions?: No  Permission Sought/Granted                  Emotional Assessment Appearance:: Appears older than stated age Attitude/Demeanor/Rapport: Irrational Affect (typically observed): Frustrated Orientation: : Oriented to Self Alcohol  / Substance Use: Not Applicable Psych Involvement: No (comment)  Admission diagnosis:  Diabetic foot ulcer (HCC) [Z88.378, L97.509] Diabetic ulcer of left midfoot associated with type 2 diabetes mellitus, unspecified ulcer stage (HCC) [Z88.378, L97.429] Patient Active Problem List   Diagnosis Date Noted   Diabetic foot ulcer (HCC) 06/23/2024   Decreased pedal pulses 06/23/2024   H/O supraventricular tachycardia 03/28/2024   Acute cystitis 03/28/2024   Hematuria 03/28/2024   History of seizure 03/28/2024   Chronic hypotension 03/28/2024   Pancreatic insufficiency 03/28/2024   Generalized anxiety disorder 03/28/2024   Acute cystitis with hematuria 03/28/2024   Flank pain 01/08/2024   Pneumococcal bacteremia 01/08/2024   Pneumaturia 01/08/2024   Sepsis (HCC) 01/07/2024   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   SVT (supraventricular tachycardia) 01/07/2024   Infection due to ESBL-producing Klebsiella pneumoniae 01/07/2024   Gram-negative bacteremia 01/07/2024  Hyperglycemia 01/07/2024   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   Abnormal findings on diagnostic imaging of spine 01/06/2024   Acute pyelonephritis 01/06/2024   Fall 08/01/2023   ICH (intracerebral hemorrhage) (HCC) 07/30/2023   Pain in left shoulder 03/25/2022   Pressure injury of skin 01/08/2022   Frequent falls 01/08/2022   DKA (diabetic ketoacidosis) (HCC) 01/07/2022   Rhabdomyolysis 01/07/2022    Protein-calorie malnutrition, severe 12/23/2020   Hyperosmolar hyperglycemic state (HHS) (HCC) 12/22/2020   Uncontrolled type 2 diabetes mellitus with hyperglycemia, with long-term current use of insulin  (HCC) 12/21/2020   Hyperglycemia due to diabetes mellitus (HCC) 12/21/2020   Empyema lung (HCC)    Epidural abscess    Diskitis 02/06/2020   Alcohol  withdrawal delirium (HCC) 02/06/2020   Epilepsy (HCC) 02/06/2020   Pleural effusion on right 02/06/2020   History of CVA (cerebrovascular accident) 01/23/2017   Dry eyes    Sleep disturbance    Essential hypertension, benign    Upper GI bleed    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   Fatty liver    Tobacco abuse    Insulin  dependent type 2 diabetes mellitus (HCC)    History of CVA with residual deficit    Acute lower UTI    Wide-complex tachycardia    Chronic alcoholic pancreatitis (HCC)    Hyponatremia 03/09/2016   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   Acute blood loss anemia 03/09/2016   Gastric varices    Alcohol  abuse    Left-sided neglect    Normocytic anemia    UGIB (upper gastrointestinal bleed)    Pressure ulcer 03/06/2016   Acute encephalopathy    Cerebral infarction (HCC) 03/05/2016   Carotid stenosis 04/06/2015   CAD in native artery 03/16/2015   Unstable angina pectoris (HCC) 03/15/2015   Neck pain 10/20/2013   Insomnia 12/29/2012   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 02/25/2011   Hepatic steatosis 02/25/2011   PCP:  Donal Channing SQUIBB, FNP Pharmacy:   [facility] - [place], [place] - [address] [place] [address] Phone: [phone] Fax: [phone]     Social Drivers of Health (SDOH) Social History: SDOH Screenings   Food Insecurity: No Food Insecurity (06/23/2024)  Housing: Low Risk  (06/23/2024)  Transportation Needs: No Transportation Needs (06/23/2024)  Utilities: Not At Risk (06/23/2024)  Physical Activity: Inactive (11/15/2021)  Stress: No Stress Concern Present (10/31/2021)  Tobacco Use: High Risk (03/28/2024)   SDOH Interventions:     Readmission Risk Interventions    06/25/2024   11:20 AM 01/08/2024   11:12 AM  Readmission Risk Prevention Plan  Transportation Screening Complete Complete  Medication Review (RN Care Manager) Complete Complete  PCP or Specialist appointment within 3-5 days of discharge Complete Complete  HRI or Home Care Consult Complete Complete  SW Recovery Care/Counseling Consult Complete Complete  Palliative Care Screening  Not Applicable  Skilled Nursing Facility Complete Complete

## 2024-06-25 NOTE — Consult Note (Addendum)
 Hospital Consult    Reason for Consult:  L foot wound Requesting Physician: [facility] MRN #:  [id number]  History of Present Illness: This is a 58 y.o. male being seen in consultation for evaluation of left foot wound.  He is a long-term care resident at [facility] skilled nursing facility.  He has an extensive past medical history significant for CVA with right-sided hemiparesis, CAD, hypertension, hyperlipidemia history of epidural abscess in April of this year treated by ID.  He developed a foot wound on the left foot and is having increasing pain.  He is nonambulatory.  He denies fevers, chills, nausea/vomiting.  Past Medical History:  Diagnosis Date   Alcohol  dependency (HCC)    Hx ETOH withdrawal seizures before 2011   CAD (coronary artery disease) 06/13/2012   Calcification noted on CTA of chest in 2012 Wall motion abnormality on ECHO     Carotid artery disease 2016   bilateral.  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    Depression with anxiety    Diabetes mellitus without complication (HCC)    GERD (gastroesophageal reflux disease)    Headache(784.0)    migraine   Heart disease 02/2015   PCI/DES placed to RCA: on chronic Plavix /ASA   Hyperlipidemia    Hypertension    Pancreatitis 2011....    CT findings in May 2011 with inflammation and pseudocyst.  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/2016   Renal artery stenosis, native, bilateral 02/2015    Past Surgical History:  Procedure Laterality Date   CARDIAC CATHETERIZATION N/A 03/15/2015   Procedure: Left Heart Cath;  Surgeon: Denyse DELENA Bathe, MD;  Location: [facility] INVASIVE CV LAB;  Service: Cardiovascular;  Laterality: N/A;   CARDIAC CATHETERIZATION N/A 03/16/2015   Procedure: Coronary Stent Intervention;  Surgeon: Cara JONETTA Lovelace, MD;  Location: [facility] INVASIVE CV LAB;  Service: Cardiovascular;  Laterality: N/A;   CAROTID ANGIOGRAM N/A  06/15/2012   Procedure: CAROTID ANGIOGRAM;  Surgeon: Lonni GORMAN Blade, MD;  Location: [facility] CATH LAB;  Service: Cardiovascular;  Laterality: N/A;   ESOPHAGOGASTRODUODENOSCOPY N/A 03/08/2016   Procedure: ESOPHAGOGASTRODUODENOSCOPY (EGD);  Surgeon: Elspeth Deward Naval, MD;  Location: [facility] ENDOSCOPY;  Service: Gastroenterology;  Laterality: N/A;   none     PERIPHERAL VASCULAR CATHETERIZATION Left 04/06/2015   Procedure: Carotid PTA/Stent Intervention;  Surgeon: Selinda GORMAN Gu, MD;  Location: [facility] INVASIVE CV LAB;  Service: Cardiovascular;  Laterality: Left;    No Known Allergies  Prior to Admission medications   Medication Sig Start Date End Date Taking? Authorizing Provider  acetaminophen  (TYLENOL ) 325 MG tablet Take 650 mg by mouth every 6 (six) hours as needed for mild pain (pain score 1-3) (cannot exceed a total of 3,000 mg/24 hours).   Yes [provider]  ascorbic acid  (VITAMIN C ) 500 MG tablet Take 500 mg by mouth daily. 11/26/21  Yes [provider]  baclofen (LIORESAL) 20 MG tablet Take 20 mg by mouth 3 (three) times daily.   Yes [provider]  busPIRone  (BUSPAR ) 10 MG tablet Take 1 tablet (10 mg total) by mouth 2 (two) times daily. 12/23/20  Yes Gherghe, Costin M, MD  clopidogrel  (PLAVIX ) 75 MG tablet Take 1 tablet (75 mg total) by mouth daily. 08/14/23  Yes Elnora Ip, MD  famotidine  (PEPCID ) 20 MG tablet Take 1 tablet (20 mg total) by mouth 2 (two) times daily. 12/23/20  Yes Trixie Nilda HERO, MD  feeding supplement, GLUCERNA SHAKE, (GLUCERNA SHAKE) LIQD Take 237 mLs by mouth in the morning, at noon, and at bedtime.   Yes [provider]  ferrous sulfate  325 (65 FE) MG tablet Take 325 mg by mouth daily with breakfast.   Yes [provider]  insulin  glargine (LANTUS ) 100 UNIT/ML injection Inject 14 Units into the skin in the morning.   Yes [provider]  insulin  lispro (HUMALOG KWIKPEN) 100 UNIT/ML KwikPen Inject 0-10 Units  into the skin See admin instructions. Inject 0-10 units into the skin three times a day, per sliding scale: BGL 70-140 = give nothing; 141-170 = 2 units; 201-230 = 3 units; 231-260 = 4 units; 261-290 = 5 units; 291-320 = 6 units; 321-350 = 7 units; 351-380 = 8 units; 381-400 = 10 units; <70 OR >400 = CALL MD   Yes [provider]  levETIRAcetam  (KEPPRA ) 500 MG tablet Take 500 mg by mouth 2 (two) times daily.   Yes [provider]  melatonin 3 MG TABS tablet Take 3 mg by mouth at bedtime.   Yes [provider]  metFORMIN  (GLUCOPHAGE ) 500 MG tablet Take 500 mg by mouth 2 (two) times daily.   Yes [provider]  midodrine  (PROAMATINE ) 10 MG tablet Take 1 tablet (10 mg total) by mouth 3 (three) times daily with meals. Please hold for systolic blood pressure more than 110. Patient taking differently: Take 10 mg by mouth See admin instructions. Take 10 mg by mouth three times a day and hold for a Systolic number more than 110 5/69/74  Yes Elgergawy, Brayton RAMAN, MD  mirtazapine (REMERON) 7.5 MG tablet Take 7.5 mg by mouth at bedtime.   Yes [provider]  Multiple Vitamin (MULTIVITAMIN) tablet Take 1 tablet by mouth daily with breakfast.   Yes [provider]  nystatin cream (MYCOSTATIN) Apply 1 Application topically every 4 (four) hours as needed (for dermatitis).   Yes [provider]  ondansetron  (ZOFRAN ) 4 MG tablet Take 4 mg by mouth every 6 (six) hours as needed for nausea or vomiting.   Yes [provider]  oxyCODONE  (OXY IR/ROXICODONE ) 5 MG immediate release tablet Take 1 tablet (5 mg total) by mouth every 6 (six) hours as needed for severe pain (pain score 7-10). Patient taking differently: Take 5 mg by mouth See admin instructions. Beginning on 06/04/2024 and ending on 07/04/2024, take 5 mg by mouth three times a day 03/31/24  Yes Alekh, Kshitiz, MD  Pancrelipase , Lip-Prot-Amyl, (CREON ) 3000-9500 units CPEP Take 3,000 Units by mouth  3 (three) times daily.   Yes [provider]  polyethylene glycol (MIRALAX  / GLYCOLAX ) 17 g packet Take 17 g by mouth daily as needed. Patient taking differently: Take 17 g by mouth daily as needed for mild constipation. 01/20/24  Yes Dennise Lavada POUR, MD  rosuvastatin  (CRESTOR ) 5 MG tablet Take 5 mg by mouth at bedtime.   Yes [provider]  SANTYL 250 UNIT/GM ointment Apply 1 Application topically See admin instructions. Apply to the left lateral foot wound once a day for wound healing   Yes [provider]  senna (SENOKOT) 8.6 MG TABS tablet Take 2 tablets by mouth in the morning and at bedtime.   Yes [provider]  sertraline  (ZOLOFT ) 100 MG tablet Take 100 mg by mouth daily.   Yes [provider]  tamsulosin  (FLOMAX ) 0.4 MG CAPS capsule Take 1 capsule (0.4 mg total) by mouth daily after supper. Patient taking differently: Take  0.4 mg by mouth at bedtime. 08/01/23  Yes [name], [place], MD  zinc oxide 20 % ointment Apply 1 Application topically See admin instructions. Apply to the sacrum as needed for preventative measures   Yes [provider]  Multiple Vitamin (MULTIVITAMIN WITH MINERALS) TABS tablet Take 1 tablet by mouth daily. Patient not taking: Reported on 06/23/2024 12/23/20   Gherghe, Costin M, MD  mupirocin  ointment (BACTROBAN ) 2 % Apply 1 Application topically daily. Patient not taking: Reported on 06/23/2024 03/31/24   [name], MD  traZODone  (DESYREL ) 50 MG tablet Take 1 tablet (50 mg total) by mouth at bedtime. Patient not taking: Reported on 06/23/2024 01/20/24   Dennise Lavada POUR, MD    Social History   Socioeconomic History   Marital status: Single    Spouse name: Alan Glance   Number of children: 0   Years of education: 12   Highest education level: Not on file  Occupational History   Occupation: unemployed    Comment: previously worked at [employer]  Tobacco Use   Smoking status: Every Day    Current  packs/day: 1.00    Average packs/day: 1 pack/day for 30.0 years (30.0 ttl pk-yrs)    Types: Cigarettes   Smokeless tobacco: Never   Tobacco comments:    smoking less  Substance and Sexual Activity   Alcohol  use: No    Alcohol /week: 6.0 standard drinks of alcohol     Types: 6 Cans of beer per week    Comment: drinks 6-12 beer daily   Drug use: No   Sexual activity: Yes    Birth control/protection: None  Other Topics Concern   Not on file  Social History Narrative   Lives in [place] with his sister.   Unemployed, previously worked Surveyor, minerals down [employer]   Completed high school and some college    Patient is right-handed.   Patients one cup of coffee every morning.      Social Drivers of Corporate investment banker Strain: Not on file  Food Insecurity: No Food Insecurity (06/23/2024)   Hunger Vital Sign    Worried About Running Out of Food in the Last Year: Never true    Ran Out of Food in the Last Year: Never true  Transportation Needs: No Transportation Needs (06/23/2024)   PRAPARE - Administrator, Civil Service (Medical): No    Lack of Transportation (Non-Medical): No  Physical Activity: Inactive (11/15/2021)   Exercise Vital Sign    Days of Exercise per Week: 0 days    Minutes of Exercise per Session: 0 min  Stress: No Stress Concern Present (10/31/2021)   [employer] of Occupational Health - Occupational Stress Questionnaire    Feeling of Stress : Not at all  Social Connections: Not on file  Intimate Partner Violence: Not At Risk (06/23/2024)   Humiliation, Afraid, Rape, and Kick questionnaire    Fear of Current or Ex-Partner: No    Emotionally Abused: No    Physically Abused: No    Sexually Abused: No    Family History  Problem Relation Age of Onset   Stroke Mother        deceased   Coronary artery disease Mother    Alcohol  abuse Mother    Cancer Mother    Hypertension Father        alive   Alcohol  abuse Father    Diabetes Father     Kidney disease Father    Stroke Maternal Grandmother  ROS: Otherwise negative unless mentioned in HPI  Physical Examination  Vitals:   06/25/24 1221 06/25/24 1352  BP: 113/71 (!) 72/60  Pulse:  78  Resp:  16  Temp:  97.7 F (36.5 C)  SpO2:  98%   Body mass index is 18.21 kg/m.  General:  WDWN in NAD Gait: Not observed HENT: WNL, normocephalic Pulmonary: normal non-labored breathing Cardiac: regular Abdomen:  soft, NT/ND, no masses Skin: without rashes Extremities: extensive L foot wound pictured below Musculoskeletal: atrophy large muscle groups BLE  Neurologic: A&O Psychiatric:  The pt has Normal affect. Lymph:  Unremarkable   CBC    Component Value Date/Time   WBC 7.8 06/25/2024 0431   RBC 2.89 (L) 06/25/2024 0431   HGB 8.2 (L) 06/25/2024 0431   HCT 26.6 (L) 06/25/2024 0431   PLT 374 06/25/2024 0431   MCV 92.0 06/25/2024 0431   MCH 28.4 06/25/2024 0431   MCHC 30.8 06/25/2024 0431   RDW 14.6 06/25/2024 0431   LYMPHSABS 1.3 06/23/2024 1458   MONOABS 0.8 06/23/2024 1458   EOSABS 0.0 06/23/2024 1458   BASOSABS 0.0 06/23/2024 1458    BMET    Component Value Date/Time   NA 139 06/25/2024 0431   K 3.7 06/25/2024 0431   CL 104 06/25/2024 0431   CO2 23 06/25/2024 0431   GLUCOSE 151 (H) 06/25/2024 0431   BUN 5 (L) 06/25/2024 0431   CREATININE 0.41 (L) 06/25/2024 0431   CREATININE 0.43 (L) 03/08/2024 1021   CALCIUM  9.1 06/25/2024 0431   GFRNONAA >60 06/25/2024 0431   GFRNONAA >89 02/12/2012 1419   GFRAA >60 02/19/2020 0500   GFRAA >89 02/12/2012 1419    COAGS: Lab Results  Component Value Date   INR 1.5 (H) 01/08/2024   INR 1.5 (H) 01/07/2024   INR 0.9 01/15/2022     Non-Invasive Vascular Imaging:   L foot MRI positive for osteomyelitis of the proximal fifth metatarsal and possibly fourth metatarsal    ASSESSMENT/PLAN: This is a 58 y.o. male with extensive left foot wound with MRI positive for 4th and 5th metatarsal osteomyelitis  Mr.  Drumwright is a 58 year old male seen in consultation for extensive tissue loss of the left foot.  Workup included MRI which is positive for osteomyelitis of the 4th and 5th metatarsal.  He is a resident of a long-term care facility and is nonambulatory.  His legs are contracted at the knee as well as the hip especially of the left leg.  Given his ambulatory status, contracture, and extensive tissue loss he would be best served with an above-the-knee amputation to optimize chances of healing.  Above-the-knee amputation was discussed with the patient and he is currently not willing to proceed.  We discussed the risk of delayed treatment of osteomyelitis including systemic infection.  On-call vascular surgeon Dr. Sheree will evaluate the patient later today.  Please call vascular if patient is agreeable to AKA next week.  Recommend discussion with palliative care to establish goals of care.   Donnice Sender PA-C [facility] [phone]  I have independently interviewed and examined patient and agree with PA assessment and plan above. I have attempted to call his sister via the number on the chart but she did not answer.  Will need to clarify patient's goals of care as he would only be a candidate for above-knee amputation on the left and is high risk for amputation on the right in the future.  He does seem to demonstrate fairly good understanding  of this currently undecided on amputation.  Vester Balthazor C. Sheree, MD [facility] [place] Office: [phone] Pager: [phone]

## 2024-06-26 DIAGNOSIS — Z515 Encounter for palliative care: Secondary | ICD-10-CM

## 2024-06-26 DIAGNOSIS — E11621 Type 2 diabetes mellitus with foot ulcer: Secondary | ICD-10-CM | POA: Diagnosis not present

## 2024-06-26 DIAGNOSIS — L97429 Non-pressure chronic ulcer of left heel and midfoot with unspecified severity: Secondary | ICD-10-CM

## 2024-06-26 DIAGNOSIS — L97422 Non-pressure chronic ulcer of left heel and midfoot with fat layer exposed: Secondary | ICD-10-CM | POA: Diagnosis not present

## 2024-06-26 LAB — GLUCOSE, CAPILLARY
Glucose-Capillary: 88 mg/dL (ref 70–99)
Glucose-Capillary: 100 mg/dL — ABNORMAL HIGH (ref 70–99)
Glucose-Capillary: 95 mg/dL (ref 70–99)
Glucose-Capillary: 269 mg/dL — ABNORMAL HIGH (ref 70–99)

## 2024-06-26 LAB — IRON AND TIBC
Iron: 14 ug/dL — ABNORMAL LOW (ref 45–182)
Saturation Ratios: 7 % — ABNORMAL LOW (ref 17.9–39.5)
TIBC: 214 ug/dL — ABNORMAL LOW (ref 250–450)
UIBC: 200 ug/dL

## 2024-06-26 LAB — BASIC METABOLIC PANEL WITH GFR
Anion gap: 7 (ref 5–15)
BUN: <5 mg/dL — ABNORMAL LOW (ref 6–20)
CO2: 26 mmol/L (ref 22–32)
Calcium: 8.7 mg/dL — ABNORMAL LOW (ref 8.9–10.3)
Chloride: 106 mmol/L (ref 98–111)
Creatinine, Ser: 0.45 mg/dL — ABNORMAL LOW (ref 0.61–1.24)
GFR, Estimated: >60 mL/min (ref >60)
Glucose, Bld: 78 mg/dL (ref 70–99)
Potassium: 4.1 mmol/L (ref 3.5–5.1)
Sodium: 139 mmol/L (ref 135–145)

## 2024-06-26 LAB — FOLATE: Folate: 17 ng/mL (ref >5.9)

## 2024-06-26 LAB — PHOSPHORUS: Phosphorus: 4.3 mg/dL (ref 2.5–4.6)

## 2024-06-26 LAB — CBC
HCT: 26.3 % — ABNORMAL LOW (ref 39.0–52.0)
Hemoglobin: 8.2 g/dL — ABNORMAL LOW (ref 13.0–17.0)
MCH: 28.2 pg (ref 26.0–34.0)
MCHC: 31.2 g/dL (ref 30.0–36.0)
MCV: 90.4 fL (ref 80.0–100.0)
Platelets: 407 K/uL — ABNORMAL HIGH (ref 150–400)
RBC: 2.91 MIL/uL — ABNORMAL LOW (ref 4.22–5.81)
RDW: 14.6 % (ref 11.5–15.5)
WBC: 7.4 K/uL (ref 4.0–10.5)
nRBC: 0 % (ref 0.0–0.2)

## 2024-06-26 LAB — MAGNESIUM: Magnesium: 1.5 mg/dL — ABNORMAL LOW (ref 1.7–2.4)

## 2024-06-26 LAB — RETICULOCYTES
Immature Retic Fract: 14.8 % (ref 2.3–15.9)
RBC.: 2.89 MIL/uL — ABNORMAL LOW (ref 4.22–5.81)
Retic Count, Absolute: 28.9 K/uL (ref 19.0–186.0)
Retic Ct Pct: 1 % (ref 0.4–3.1)

## 2024-06-26 LAB — VITAMIN B12: Vitamin B-12: 253 pg/mL (ref 180–914)

## 2024-06-26 LAB — FERRITIN: Ferritin: 134 ng/mL (ref 24–336)

## 2024-06-26 MED ORDER — SODIUM CHLORIDE 0.9 % IV SOLN: INTRAVENOUS | Status: DC

## 2024-06-26 MED ORDER — SENNA 8.6 MG PO TABS
2.0000 | ORAL_TABLET | Freq: Two times a day (BID) | ORAL | Status: DC
  Administered 2024-06-26 – 2024-07-07 (×21): 17.2 mg via ORAL
  Filled 2024-06-26 (×22): qty 2

## 2024-06-26 MED ORDER — MAGNESIUM SULFATE 2 GM/50ML IV SOLN
2.0000 g | Freq: Once | INTRAVENOUS | Status: AC
  Administered 2024-06-26: 2 g via INTRAVENOUS
  Filled 2024-06-26: qty 50

## 2024-06-26 NOTE — Plan of Care (Signed)
 This NP spoke with patient's sister [name] [phone]). Plan is for us  to have a goals of care discussion with Dr. Jonel tomorrow, 06/27/2024 at 2:30PM.

## 2024-06-26 NOTE — Plan of Care (Signed)
  Problem: Coping: Goal: Ability to adjust to condition or change in health will improve Outcome: Progressing   Problem: Fluid Volume: Goal: Ability to maintain a balanced intake and output will improve Outcome: Progressing   Problem: Health Behavior/Discharge Planning: Goal: Ability to manage health-related needs will improve Outcome: Progressing   Problem: Metabolic: Goal: Ability to maintain appropriate glucose levels will improve Outcome: Progressing   Problem: Nutritional: Goal: Maintenance of adequate nutrition will improve Outcome: Progressing   Problem: Skin Integrity: Goal: Risk for impaired skin integrity will decrease Outcome: Progressing   Problem: Tissue Perfusion: Goal: Adequacy of tissue perfusion will improve Outcome: Progressing   Problem: Education: Goal: Knowledge of General Education information will improve Description: Including pain rating scale, medication(s)/side effects and non-pharmacologic comfort measures Outcome: Progressing   Problem: Clinical Measurements: Goal: Will remain free from infection Outcome: Progressing Goal: Diagnostic test results will improve Outcome: Progressing Goal: Respiratory complications will improve Outcome: Progressing Goal: Cardiovascular complication will be avoided Outcome: Progressing   Problem: Nutrition: Goal: Adequate nutrition will be maintained Outcome: Progressing   Problem: Coping: Goal: Level of anxiety will decrease Outcome: Progressing   Problem: Elimination: Goal: Will not experience complications related to bowel motility Outcome: Progressing Goal: Will not experience complications related to urinary retention Outcome: Progressing   Problem: Pain Managment: Goal: General experience of comfort will improve and/or be controlled Outcome: Progressing   Problem: Safety: Goal: Ability to remain free from injury will improve Outcome: Progressing   Problem: Skin Integrity: Goal: Risk for  impaired skin integrity will decrease Outcome: Progressing

## 2024-06-26 NOTE — Progress Notes (Signed)
 Progress Note   Patient: Brandon [name] FMW:[id number] DOB: 02-Sep-1966 DOA: 06/23/2024     3 DOS: the patient was seen and examined on 06/26/2024 at 9:22AM      Brief hospital course: 58 y.o. M with hx CVA, left hemiparesis, wheelchair and bedbound, 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 in Apr 2025 treated medically given baseline functional status, anxiety, seizures, DM, CAD last PCI >5 years, HTN, chronic hypotension on midodrine , hx chronic pancreatitis who was sent from [facility] for worsening foot wound.  In the hospital, MRI foot confirmed osteomyelitis vascular consulted.      Assessment and plan: Left fifth metatarsal osteomyelitis due to diabetic foot ulcer and left foot pressure ulcer unstageable, present on admission Admitted and started on antibiotics.  Vascular surgery consulted, ABI showed bilateral lower extremity vascular disease, vascular surgery recommended above-knee amputation. -Continue Unasyn, linezolid - Consult palliative care - See below re: capacity/decision making    Dementia  Severe protein calorie malnutrition Failure to thrive Cerebrovascular disease with left hemiplegia History of recent epidural abscess April 2025 with cauda equina syndrome, now bilateral lower extremity contractures The patient appears to have terminal disease, I recommend transition to hospice. -Continue baclofen -Continue Plavix , Crestor  - Continue feeding supplements vitamin C , multivitamin - Continue mirtazapine BuSpar , sertraline      Peripheral vascular disease ABIs show bilateral disease -Continue Plavix , Crestor   Anemia Due to chronic disease -Trend hemoglobin  Coronary artery disease -Continue Plavix , Crestor   Diabetes Glucose low - Hold insulin  given fragile state  Chronic hypotension -Continue midodrine   Seizure disorder No seizures observed. - Continue Keppra   Chronic pancreatitis -Continue Creon   Decision  making capacity The pt does not demonstrate ordered, logical thinking is not able to articulate his medical problem, alternative treatment plans, or foreseeable consequences, due to dementia.  His sister has been identified by his facility as his next of kin and surrogate decision maker, no other POA is known.                      Subjective: The patient has to be left alone, he volunteers no specific complaints.  He reports diffuse pains when he moved.  Nursing report no change, no fever, no respiratory distress.     Physical Exam: BP (!) 85/48 (BP Location: Right Arm)   Pulse (!) 54   Temp (!) 97.4 F (36.3 C) (Oral)   Resp 16   Wt 61 kg   SpO2 95%   BMI 18.24 kg/m   Cachectic adult male, lying in bed, severely debilitated, attempts to make eye contact but severely limited physical repertoire RRR, no murmurs, no peripheral edema Respiratory rate normal-due to poor inspiratory effort, but no rales or wheezes appreciated Abdomen without grimace to palpation although difficult to assess abdomen due to contractures He has severe contractures in all 4 extremities, worse on the left.  He is unable to move the left side.  He seems to have increased tone on the right side, but other than moving his neck he really is unable to make any spontaneous purposeful movements.  He does attempt to make eye contact briefly, then closes his eyes again.  He is oriented to self, but has psychomotor slowing.  He states that he is in a hotel.  He is not able to volunteer answers to any other questions    Data Reviewed: Discussed with palliative care Basic metabolic panel shows normal electrolytes and renal function Magnesium  low Hemoglobin stable at  8.2, white count normal  Recent Labs  Lab 06/24/24 0437 06/25/24 0431 06/26/24 1019  NA 135 139 139  K 3.7 3.7 4.1  CO2 21* 23 26  BUN 12 5* <5*  CREATININE 0.51* 0.41* 0.45*  MG  --  1.7 1.5*  WBC 7.9 7.8 7.4  HGB 8.0* 8.2* 8.2*   PLT 369 374 407*     Family Communication: Sister by phone    Disposition: Status is: Inpatient         Author: Lonni SHAUNNA Dalton, MD 06/26/2024 2:30 PM  For on call review [email or web address].

## 2024-06-26 NOTE — Hospital Course (Addendum)
 58 y.o. M with hx CVA, left hemiparesis, wheelchair and bedbound, 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 in Apr 2025 treated medically given baseline functional status, anxiety, seizures, DM, CAD last PCI >5 years, HTN, chronic hypotension on midodrine , hx chronic pancreatitis who was sent from [facility] for worsening foot wound.  In the hospital, MRI foot confirmed osteomyelitis vascular consulted.      Assessment and plan: Left fifth metatarsal osteomyelitis due to diabetic foot ulcer and left foot pressure ulcer unstageable, present on admission Admitted and started on antibiotics.  Vascular surgery consulted, ABI showed bilateral lower extremity vascular disease, vascular surgery recommended above-knee amputation. -Continue Unasyn, linezolid - Consult palliative care - See below re: capacity/decision making    Dementia  Severe protein calorie malnutrition Failure to thrive Cerebrovascular disease with left hemiplegia History of recent epidural abscess April 2025 with cauda equina syndrome, now bilateral lower extremity contractures The patient appears to have terminal disease, I recommend transition to hospice. -Continue baclofen -Continue Plavix , Crestor  - Continue feeding supplements vitamin C , multivitamin - Continue mirtazapine BuSpar , sertraline      Peripheral vascular disease ABIs show bilateral disease -Continue Plavix , Crestor   Anemia Due to chronic disease -Trend hemoglobin  Coronary artery disease -Continue Plavix , Crestor   Diabetes Glucose low - Hold insulin  given fragile state  Chronic hypotension -Continue midodrine   Seizure disorder No seizures observed. - Continue Keppra   Chronic pancreatitis -Continue Creon   Decision making capacity The pt does not demonstrate ordered, logical thinking is not able to articulate his medical problem, alternative treatment plans, or foreseeable consequences, due to dementia.   His sister has been identified by his facility as his next of kin and surrogate decision maker, no other POA is known.

## 2024-06-27 DIAGNOSIS — Z7189 Other specified counseling: Secondary | ICD-10-CM

## 2024-06-27 DIAGNOSIS — E11621 Type 2 diabetes mellitus with foot ulcer: Secondary | ICD-10-CM | POA: Diagnosis not present

## 2024-06-27 DIAGNOSIS — R627 Adult failure to thrive: Secondary | ICD-10-CM | POA: Diagnosis not present

## 2024-06-27 DIAGNOSIS — I70292 Other atherosclerosis of native arteries of extremities, left leg: Secondary | ICD-10-CM | POA: Diagnosis not present

## 2024-06-27 DIAGNOSIS — M7989 Other specified soft tissue disorders: Secondary | ICD-10-CM | POA: Diagnosis not present

## 2024-06-27 DIAGNOSIS — L97422 Non-pressure chronic ulcer of left heel and midfoot with fat layer exposed: Secondary | ICD-10-CM | POA: Diagnosis not present

## 2024-06-27 DIAGNOSIS — Z515 Encounter for palliative care: Secondary | ICD-10-CM | POA: Diagnosis not present

## 2024-06-27 DIAGNOSIS — M869 Osteomyelitis, unspecified: Secondary | ICD-10-CM

## 2024-06-27 LAB — GLUCOSE, CAPILLARY
Glucose-Capillary: 242 mg/dL — ABNORMAL HIGH (ref 70–99)
Glucose-Capillary: 180 mg/dL — ABNORMAL HIGH (ref 70–99)
Glucose-Capillary: 157 mg/dL — ABNORMAL HIGH (ref 70–99)
Glucose-Capillary: 143 mg/dL — ABNORMAL HIGH (ref 70–99)

## 2024-06-27 MED ORDER — SODIUM CHLORIDE 0.9 % IV BOLUS
250.0000 mL | Freq: Once | INTRAVENOUS | Status: AC
  Administered 2024-06-27: 250 mL via INTRAVENOUS

## 2024-06-27 NOTE — Progress Notes (Signed)
  Daily Progress Note   Subjective: Is ready to proceed if it has to be done  Objective: Vitals:   06/27/24 0535 06/27/24 0817  BP: 115/72 90/71  Pulse: 74 74  Resp: 17 15  Temp: 98.4 F (36.9 C) 98.2 F (36.8 C)  SpO2: 98% 100%    Physical Examination HDS Nonlabored breathing   ASSESSMENT/PLAN:  [name] is a 58 year old male with extensive tissue loss of the left foot.  He has osteomyelitis of his left 4th and 5th toe.  He resides in a long-term care facility and is nonambulatory with significant contractures.  He was offered left AKA on Friday and was not agreeable at that time.  After speaking with him today he is agreeable to AKA as he understands that it is his only option for wound healing. Plan for left AKA with Dr. Lanis tomorrow   Norman GORMAN Serve MD [facility] [phone] 06/27/2024  9:33 AM

## 2024-06-27 NOTE — Progress Notes (Signed)
 Initial Nutrition Assessment  DOCUMENTATION CODES:  Not applicable  INTERVENTION:  Continue Vitamin C  and Multivitamin w/ minerals daily Discontinue Glucerna, replace with Mighty Shake TID with meals, each supplement provides 330 kcals and 9 grams of protein Encourage good PO intake   NUTRITION DIAGNOSIS:  Increased nutrient needs related to wound healing as evidenced by estimated needs.  GOAL:  Patient will meet greater than or equal to 90% of their needs  MONITOR:  PO intake, Supplement acceptance, Skin, Weight trends  REASON FOR ASSESSMENT:  Consult Assessment of nutrition requirement/status  ASSESSMENT:  58 y.o. male presented to the ED with worsening foot ulcer from SNF. PMH includes CVA w/ R-sde deficets, GERD, CAD, HLD, T2DM, HTN, and chronic pancreatitis. Pt admitted with diabetic foot ulcer, possible osteomyelitis.   10/01 - Admitted  10/03 - transferred to [facility]  RD working remotely at time of assessment. Pt not initially agreeable for L AKA, no agreeable and plan for L AKA tomorrow with vascular surgery. Palliative care meeting scheduled for today with patients sister. Per MD note, pt oriented to self only. RD to adjust oral nutrition supplements to strawberry flavor per pt preference from previous RD encounters.   No meal intakes have been recorded.  Per chart review, pt with a 14.3% weight loss since November 2024. Noted that pt is nonambulatory at baseline with contractors in legs.   Admission Weight: 60.9 kg Current Weight: 62.3 kg  Nutrition Related Medications: Vitamin C , Pepcid , Ferrous Sulfate , Novolog  0-5 units daily + 0-9 units TID, Lantus  10 units daily, Creon , Melatonin, Remeron, MVI, Senna, IV antibiotics  Labs: Hgb A1c 9.5, Vitamin B12 253, Folate 17.0, CRP 16.2 CBG: 95-269 mg/dL x 24 hrs   UOP: 8699 mL x 24 hrs  NUTRITION - FOCUSED PHYSICAL EXAM: Deferred to follow-up.  Diet Order:   Diet Order             Diet NPO time specified  Diet  effective midnight           Diet Carb Modified Fluid consistency: Thin; Room service appropriate? Yes  Diet effective now                  EDUCATION NEEDS:  Not appropriate for education at this time  Skin:  Skin Assessment: Reviewed RN Assessment Per WOC note 06/23/24: 1.  Stage 4 Pressure Injury L lateral foot (bone palpable per notes) tan necrotic tissue  2.  Unstageable Pressure Injury L heel brown/black eschar   Last BM:  10/5 - Type 3  Height:  Ht Readings from Last 1 Encounters:  03/28/24 6' (1.829 m)   Weight:  Wt Readings from Last 1 Encounters:  06/27/24 62.3 kg   Ideal Body Weight:  80.9 kg  BMI:  Body mass index is 18.63 kg/m.  Estimated Nutritional Needs:  Kcal:  1850-2050 Protein:  90-120 grams Fluid:  >/= 1.8 L   Nestora Glatter RD, LDN Clinical Dietitian

## 2024-06-27 NOTE — Plan of Care (Signed)
   Problem: Education: Goal: Ability to describe self-care measures that may prevent or decrease complications (Diabetes Survival Skills Education) will improve Outcome: Progressing

## 2024-06-27 NOTE — Consult Note (Signed)
 Consultation Note Date: 06/27/2024   Patient Name: Brandon [name]  DOB: 11-13-1965  MRN: [id number]  Age / Sex: 58 y.o., male  PCP: Donal Channing SQUIBB, FNP Referring Physician: Jonel Lonni SQUIBB, *  Reason for Consultation: Establishing goals of care  HPI/Patient Profile: 58 y.o. male  with past medical history of CVA, left hemiparesis, wheelchair and bedbound, 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 in Apr 2025 treated medically given baseline functional status, anxiety, seizures, DM, CAD last PCI >5 years, HTN, chronic hypotension on midodrine , hx chronic pancreatitis  admitted on 06/23/2024 with worsening left foot wound. Imaging confirms left fifth metatarsal osteomyelitis.   Worth to note, this patient is being followed by Vascular surgery, they recommended left above-knee amputation.  PMT has been consulted to assist with goals of care conversation. Patient/Family face treatment option decisions, advanced directive decisions and anticipatory care needs.   Family face treatment option decision, advance directive decisions and anticipatory care needs.   Clinical Assessment and Goals of Care:  I have reviewed medical records including EPIC notes, labs and imaging, , assessed the patient and then met with patient to attempt to discuss diagnosis prognosis, GOC, EOL wishes, disposition and options.  I introduced Palliative Medicine as specialized medical care for people living with serious illness. It focuses on providing relief from the symptoms and stress of a serious illness. The goal is to improve quality of life for both the patient and the family.  Visited the patient at bedside today. No family members were present during the encounter. The patient appears chronically ill and severely debilitated, with notable contractures in all four extremities. A wound dressing was observed on the left foot. Despite his condition, he was  not in any apparent acute distress.  The patient was alert and able to respond to basic questions, though he demonstrated disorientation and was unable to provide reliable or detailed information due to underlying dementia. He denied experiencing pain or discomfort. When asked about the reason for hospitalization, he stated, "My foot got infected," but was unable to elaborate further. A brief goals of care discussion was initiated, during which the patient expressed a desire for comfort should his condition worsen. He declined further conversation and requested that I speak with his sister, Brandon [name].  As a follow-up, I had previously contacted Brandon yesterday to arrange a goals of care discussion with Dr. Jonel for today at 2:30 PM. Although she initially agreed to attend, I received a message this morning indicating she would not be able to make it. Attempts to reach her for rescheduling were unsuccessful; a voicemail was left requesting a return call. Dr. Jonel was updated on the situation, and we plan to reschedule the meeting for tomorrow.  Received report from the bedside RN. The patient is reportedly comfortable but has poor oral intake and ongoing confusion.  Questions and concerns were addressed. The patient was encouraged to call with questions or concerns.  PMT will continue to support holistically.   Social History: Patient is a resident of [facility].   Functional and Nutritional State: He is non-ambulatory at baseline. He appears very   Palliative Symptoms: Generalized weakness, loss of appetite, left foot wound  Advance Directives: No advance directive on file.    Code Status: Full Code  Primary Decision Maker: Next of Kin, Brandon [name]   SUMMARY OF RECOMMENDATIONS    Code Status: Full Code Continue current scope of care Pending GOC discussion with patient's sister (will  attempt to reschedule GOC meeting, reached out to sister on 06/27/24 at 3PM, no  answer, I left VM to return call).  I am in agreement with the attending physician to defer the left above-knee amputation (L AKA) until a goals of care discussion can be held with the patient's next of kin. Continue to provide psycho-social and emotional support to patient and family Palliative medicine team will continue to follow.   Symptom Management: Per Primary team Tylenol  PRN for mild pain Hydromorphone  PRN for moderate pain Oxycodone  PRN for severe pain Bowel regimen: Senokot-S PRN for constipation Palliative medicine is available to assist as needed.    Palliative Prophylaxis:  Bowel Regimen, Delirium Protocol, Frequent Pain Assessment, Oral Care, Palliative Wound Care, and Turn Reposition   Prognosis:  Prognosis is guarded due to patient high disease burden.   Discharge Planning: To Be Determined      Primary Diagnoses: Present on Admission:  Diabetic foot ulcer (HCC)    Physical Exam Vitals and nursing note reviewed.  Constitutional:      Appearance: He is ill-appearing.  HENT:     Head: Normocephalic and atraumatic.     Mouth/Throat:     Mouth: Mucous membranes are moist.     Pharynx: Oropharynx is clear.  Cardiovascular:     Rate and Rhythm: Normal rate.  Pulmonary:     Effort: Pulmonary effort is normal.  Abdominal:     General: Abdomen is flat.  Genitourinary:    Comments: Foley catheter in place.  Musculoskeletal:     Comments: Left foot wound Contractures in all 4 extremities  Neurological:     Mental Status: He is oriented to person, place, and time.  Psychiatric:        Mood and Affect: Mood normal.     Comments: Underlying dementia     Vital Signs: BP 90/71 (BP Location: Right Arm)   Pulse 74   Temp 98.2 F (36.8 C) (Oral)   Resp 15   Wt 62.3 kg   SpO2 100%   BMI 18.63 kg/m  Pain Scale: 0-10 POSS *See Group Information*: S-Acceptable,Sleep, easy to arouse Pain Score: 10-Worst pain ever   SpO2: SpO2: 100 % O2 Device:SpO2:  100 % O2 Flow Rate: .    Palliative Assessment/Data: 20 to 30%    Total time: I spent 50 minutes in the care of the patient today in the above activities and documenting the encounter.     Kathlyne JULIANNA Tracie Mickey, NP  Palliative Medicine Team Team phone # [phone]  Thank you for allowing the Palliative Medicine Team to assist in the care of this patient. Please utilize secure chat with additional questions, if there is no response within 30 minutes please call the above phone number.  Palliative Medicine Team providers are available by phone from 7am to 7pm daily and can be reached through the team cell phone.  Should this patient require assistance outside of these hours, please call the patient's attending physician.

## 2024-06-27 NOTE — Progress Notes (Signed)
  Progress Note   Patient: Brandon [name] FMW:[id number] DOB: Jan 10, 1966 DOA: 06/23/2024     4 DOS: the patient was seen and examined on 06/27/2024 at 9:59AM      Brief hospital course: 58 y.o. M with hx CVA, left hemiparesis, wheelchair and bedbound, 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 in Apr 2025 treated medically given baseline functional status, anxiety, seizures, DM, CAD last PCI >5 years, HTN, chronic hypotension on midodrine , hx chronic pancreatitis who was sent from [facility] for worsening foot wound.  In the hospital, MRI foot confirmed osteomyelitis vascular consulted.      Assessment and plan: Left fifth metatarsal osteomyelitis due to diabetic foot ulcer and left foot pressure ulcer unstageable, present on admission See below regarding capacity, unclear goals of care, terminal prognosis. -Continue IV antibiotics - Consult palliative care for goals of care discussion, these are pending    Dementia  Severe protein calorie malnutrition Failure to thrive Cerebrovascular disease with left hemiplegia History of recent epidural abscess April 2025 with cauda equina syndrome, now bilateral lower extremity contractures The patient's trajectory over the last 6 months is that he has lost 20 lbs, is mostly disoriented, and cannot get out of bed.   I recommend transition to hospice and against amputation. -Okay to continue baclofen, Plavix , Crestor , feeding supplements, vitamin C , MVI, mirtazapine, BuSpar , and sertraline  for now     Peripheral vascular disease ABIs show bilateral disease, necessitating AKA if you were to have amputation - Continue Plavix  and Crestor  for now  Anemia Stable, no bleeding  Coronary artery disease -Continue Plavix  and Crestor   Diabetes He is cachectic and so his glucoses are low - Hold insulin   Chronic hypotension -Continue midodrine   Seizure disorder No seizures - Continue Keppra   Chronic  pancreatitis -Continue Creon   Decision making capacity The patient's interactions do not demonstrate consistent logical thinking, and he has severe short-term memory loss.  This was confirmed with his sister and with his facility.  The patient does not have capacity for decision making or consent, goal was to meet with his sister today, but she has canceled.          Subjective: No nursing concerns, patient reports pain but is unable to state where.  No fever.     Physical Exam: BP 90/71 (BP Location: Right Arm)   Pulse 74   Temp 98.2 F (36.8 C) (Oral)   Resp 15   Wt 62.3 kg   SpO2 100%   BMI 18.63 kg/m   Cachectic adult male, lying in bed, severely debilitated, does not make eye contact RRR, no murmurs, no peripheral edema Respiratory rate normal, lungs diminished due to poor inspiratory effort, no rales or wheezes appreciated Abdomen soft without grimace to palpation Severe contractures in all 4 extremities, worse on the left, unable to move the left side, increased tone in all 4 extremities, oriented to self only, does not know where he is, the month or year.  Stopped answering questions after 1 or 2 attempts.  Data Reviewed: Discussed with palliative care Basic metabolic panel shows normal electrolytes, hypomagnesemia CBC shows persistent anemia     Family Communication:     Disposition: Status is: Inpatient         Author: Lonni SHAUNNA Dalton, MD 06/27/2024 4:29 PM  For on call review [email or web address].

## 2024-06-28 ENCOUNTER — Encounter (HOSPITAL_COMMUNITY): Payer: Self-pay | Admitting: Certified Registered Nurse Anesthetist

## 2024-06-28 ENCOUNTER — Encounter (HOSPITAL_COMMUNITY)
Admission: EM | Disposition: A | Discharge status: Skilled Nursing Facility | Payer: Self-pay | Source: Skilled Nursing Facility | Attending: Family Medicine

## 2024-06-28 DIAGNOSIS — L97422 Non-pressure chronic ulcer of left heel and midfoot with fat layer exposed: Secondary | ICD-10-CM | POA: Diagnosis not present

## 2024-06-28 DIAGNOSIS — E11621 Type 2 diabetes mellitus with foot ulcer: Secondary | ICD-10-CM | POA: Diagnosis not present

## 2024-06-28 LAB — CULTURE, BLOOD (ROUTINE X 2)
Culture: NO GROWTH
Culture: NO GROWTH
Special Requests: ADEQUATE

## 2024-06-28 LAB — GLUCOSE, CAPILLARY
Glucose-Capillary: 124 mg/dL — ABNORMAL HIGH (ref 70–99)
Glucose-Capillary: 163 mg/dL — ABNORMAL HIGH (ref 70–99)
Glucose-Capillary: 155 mg/dL — ABNORMAL HIGH (ref 70–99)
Glucose-Capillary: 209 mg/dL — ABNORMAL HIGH (ref 70–99)

## 2024-06-28 SURGERY — AMPUTATION, ABOVE KNEE
Anesthesia: General | Site: Knee | Laterality: Left

## 2024-06-28 NOTE — Progress Notes (Signed)
 6N Nurse Dellis Chester, RN called stating patient has been consistently refusing surgery to have left above knee amputation. OR called and made aware.

## 2024-06-28 NOTE — Progress Notes (Signed)
 Progress Note   Patient: Brandon [name] FMW:[id number] DOB: 01/29/1966 DOA: 06/23/2024     5 DOS: the patient was seen and examined on 06/28/2024 at 9:34AM      Brief hospital course: 58 y.o. M with hx CVA, left hemiparesis, wheelchair and bedbound, 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 in Apr 2025 treated medically given baseline functional status, anxiety, seizures, DM, CAD last PCI >5 years, HTN, chronic hypotension on midodrine , hx chronic pancreatitis who was sent from [facility] for worsening foot wound.  In the hospital, MRI foot confirmed osteomyelitis vascular consulted.      Assessment and plan: Left fifth metatarsal osteomyelitis due to diabetic foot ulcer and left foot pressure ulcer unstageable, present on admission Admitted and started on antibiotics.  The extent of infection is not curable medically.    Vascular surgery consulted, ABI showed bilateral lower extremity vascular disease, vascular surgery recommended above-knee amputation.  However, given his overall prognosis, the medical team strongly recommends against this.   Hospice discussions are in process. - Continue Unasyn, linezolid for now - Consult palliative care    Dementia  Severe protein calorie malnutrition Failure to thrive Cerebrovascular disease with left hemiplegia History of recent epidural abscess April 2025 with cauda equina syndrome, now bilateral lower extremity contractures The patient was living at home until 2023.  Early that year, he was already described as severely malnourished in the setting of alcoholism, diabetes, chronic pancreatitis, and requiring assistance with ambulating.   A year later, he had moved in to LTC, was admitted from SNF with fall and IPH, had numerous pressure ulcers at that time.  From that point on, he appears to have not been able to walk.  In April of this year, he was admitted for emphysematous pyelonephritis, S pneumo/ESBL  Klebsiella bacteremia and large inoperable epidural abscess.  This was treated medically, but by his hospital follow ups late spring, he was unable to turn himself on the gurney and showing crippling contractures in his arms and legs.  With regard to dementia, he has had multiple strokes, subsequent intraparenchymal hemorrhage.  In addition, MRI scans for the last several years have shown age-advanced cerebral atrophy, possibly alcohol  related, or premature dementia.    In totality, with his dementia, malnutrition, paralysis, and contractures, and now osteomyelitis, he appears to have terminal disease, I recommend transition to hospice. -Continue baclofen, Plavix , Crestor , feeding supplements, vitamin C , MVI, mirtazapine, Buspar  and sertraline  for now      Peripheral vascular disease Long standing vascular disease.  ABI on left with moderate to severe disease. - Continue Plavix , Crestor   Anemia Due to chronic disease -Trend hemoglobin  Coronary artery disease -Continue Plavix , Crestor   Diabetes Glucose low - Hold insulin  given fragile state  Chronic hypotension -Continue midodrine   Seizure disorder No seizures observed. - Continue Keppra   Chronic pancreatitis -Continue Creon   Decision making capacity The pt does not demonstrate ordered, logical thinking is not able to articulate his medical problem, alternative treatment plans, or foreseeable consequences, due to dementia.  His sister has been identified by his facility as his next of kin and surrogate decision maker, no other POA is known.                      Subjective: No change, no nursing concerns.  No fever, norespiraotry symptoms, patietn reports pain with movement, not able to articulate any other issues.     Physical Exam: BP (!) 82/56 (BP Location: Left  Arm)   Pulse 67   Temp 98.2 F (36.8 C) (Oral)   Resp 16   Wt 62.3 kg   SpO2 98%   BMI 18.63 kg/m   General: Pt is awake, but only makes  eye contact briefly, gives unclear 1 word answers and does not register 50% of questions  Cardiovascular: RRR, nl S1-S2, no murmurs appreciated.   No LE edema.   Respiratory: Normal respiratory rate and rhythm.  CTAB without rales or wheezes. Abdominal: Abdomen soft and non-tender.  No distension or HSM.   MSK: Emaciated, severe loss of subcutanous muscle mass and fat Neuro/Psych: Bilatearl legs are paralyzed and severely contractured.  Left arm paralyced and contractured, right arm is weak, but can move with 4/4 strength, limted ROM. Oriented to self only, not place, month, or year.  Remembers we have discussed amputation.    Family Communication: Sster at bedside    Disposition: Status is: Inpatient         Author: Lonni SHAUNNA Dalton, MD 06/28/2024 7:15 PM  For on call review [email or web address].

## 2024-06-28 NOTE — Plan of Care (Signed)

## 2024-06-28 NOTE — Plan of Care (Signed)
     Referral previously received for Brandon [name] for goals of care discussion. Noted most recent palliative in-person assessment dated 06/27/2024.  Chart reviewed for Recent provider notes, nurse notes, vitals, labs, and imaging.  This morning, I attempted to contact the patient's sister and next of kin, [name], to reschedule the GOC discussion with the attending physician. There was no answer, so I left a detailed voicemail requesting a return call. A second attempt was made later in the afternoon, but again there was no response. Subsequently, I received communication from Dr. Jonel indicating that Luke plans to be present at 5:30 PM for the [facility] meeting. Dr. Jonel intends to meet with her at the agreed time to proceed with the discussion.  Please contact the palliative medicine provider on service for any new/urgent needs that require our assistance with this patient.  Thank you for your referral and allowing PMT to assist in [place] Kuang Bache's care.   Kathlyne FALCON. Tala Eber Jr., AGNP-C Palliative Medicine Team Phone: [phone]  NO CHARGE

## 2024-06-28 NOTE — Progress Notes (Signed)
 No plan for surgical intervention. Recommend palliative care/comfort care discussions.  Vascular will sign off at this time.  Happy to help if patient changes his mind.

## 2024-06-28 NOTE — IPAL (Signed)
  Interdisciplinary Goals of Care Family Meeting   Date carried out: 06/28/2024  Location of the meeting: Unit  Member's involved: Physician and Family Member or next of kin sister Mrs. Justice  Durable Power of Attorney or acting medical decision maker: [name] Justice    Discussion: We discussed goals of care for [employer] .  We reviewed his current status, and the opinion of the medical team that his doctors feel he has an overall terminal condition of his dementia, malnutrition, and paralysis, now manifesting in a pressure sore leading to osteomyelitis.  Amputation would cause significant pain and suffering with unclear benefit to long term prognosis, and that we recommend Hospice.  Family will consider and contemplate overnight.  Code status:   Code Status: Full Code   Disposition: Continue current acute care  Time spent for the meeting: 25 minutes    Lonni SHAUNNA Dalton, MD  06/28/2024, 7:17 PM

## 2024-06-29 DIAGNOSIS — E11621 Type 2 diabetes mellitus with foot ulcer: Secondary | ICD-10-CM | POA: Diagnosis not present

## 2024-06-29 DIAGNOSIS — L97422 Non-pressure chronic ulcer of left heel and midfoot with fat layer exposed: Secondary | ICD-10-CM | POA: Diagnosis not present

## 2024-06-29 LAB — GLUCOSE, CAPILLARY
Glucose-Capillary: 95 mg/dL (ref 70–99)
Glucose-Capillary: 156 mg/dL — ABNORMAL HIGH (ref 70–99)
Glucose-Capillary: 158 mg/dL — ABNORMAL HIGH (ref 70–99)
Glucose-Capillary: 195 mg/dL — ABNORMAL HIGH (ref 70–99)

## 2024-06-29 NOTE — Progress Notes (Signed)
 Progress Note   Patient: Brandon [name] FMW:[id number] DOB: 05/21/1966 DOA: 06/23/2024     6 DOS: the patient was seen and examined on 06/29/2024 at 8:15AM      Brief hospital course: 58 y.o. M with hx CVA, left hemiparesis, wheelchair and bedbound, 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 in Apr 2025 treated medically given baseline functional status, anxiety, seizures, DM, CAD last PCI >5 years, HTN, chronic hypotension on midodrine , hx chronic pancreatitis who was sent from [facility] for worsening foot wound.  In the hospital, MRI foot confirmed osteomyelitis vascular consulted.      Assessment and plan: Left fifth metatarsal osteomyelitis due to diabetic foot ulcer and left foot pressure ulcer unstageable, present on admission Admitted and started on antibiotics.  The extent of infection is not curable medically.    Vascular surgery consulted, ABI showed bilateral lower extremity vascular disease, vascular surgery recommended above-knee amputation.  However, given his overall prognosis, the medical team strongly recommends against this.   Hospice discussions are in process. - Continue Unasyn, linezolid for now - Consult palliative care    Dementia  Severe protein calorie malnutrition Failure to thrive Cerebrovascular disease with left hemiplegia History of recent epidural abscess April 2025 with cauda equina syndrome, now bilateral lower extremity contractures The patient was living at home until 2023.  Early that year, he was already described as severely malnourished in the setting of alcoholism, diabetes, chronic pancreatitis, and requiring assistance with ambulating.   A year later, he had moved in to LTC, was admitted from SNF with fall and IPH, had numerous pressure ulcers at that time.  From that point on, he appears to have not been able to walk.  In April of this year, he was admitted for emphysematous pyelonephritis, S pneumo/ESBL  Klebsiella bacteremia and large inoperable epidural abscess.  This was treated medically, but by his hospital follow ups late spring, he was unable to turn himself on the gurney and showing crippling contractures in his arms and legs.  With regard to dementia, he has had multiple strokes, subsequent intraparenchymal hemorrhage.  In addition, MRI scans for the last several years have shown age-advanced cerebral atrophy, possibly alcohol  related, or premature dementia.    In totality, with his dementia, malnutrition, paralysis, and contractures, and now osteomyelitis, he appears to have terminal disease, I recommend transition to hospice and have made this recommendation to POA/sister, who is considering.  -Continue baclofen, Plavix , Crestor , feeding supplements, vitamin C , MVI, mirtazapine, Buspar  and sertraline  for now      Peripheral vascular disease Long standing vascular disease.  ABI on left with moderate to severe disease. - Continue Plavix , Crestor   Anemia Due to chronic disease -Trend hemoglobin  Coronary artery disease -Continue Plavix , Crestor   Diabetes Glucose low - Hold insulin  given fragile state  Chronic hypotension -Continue midodrine   Seizure disorder No seizures observed. - Continue Keppra   Chronic pancreatitis -Continue Creon   Decision making capacity The pt does not demonstrate ordered, logical thinking is not able to articulate his medical problem, alternative treatment plans, or foreseeable consequences, due to dementia.  His sister has been identified by his facility as his next of kin and surrogate decision maker, no other POA is known.                    Subjective: Patient does not engage.  Makes eye contact briefly, but cannot turn his head to look at me, and can only say 1 or 2  words at a time.  I believe he is aware that he is at the hospital, and he is aware he has an infection in his leg and amputation has been mentioned, but he cannot  grapple with any abstract questions.  No fever, no respiratory symptoms, no change from nursing standpoint.     Physical Exam: BP 90/65 (BP Location: Right Arm)   Pulse 76   Temp 97.9 F (36.6 C) (Oral)   Resp 18   Wt 62.3 kg   SpO2 98%   BMI 18.63 kg/m   Cachectic adult male, disheveled, terribly contractured and malnourished, lying in fetal position, unable to move left side Hard to auscultate heart due to contractures, but rate seems regular, I do not appreaciate murmurs RR seems normal and unlabored, lungs diminished Cannot reach abdomen due to contractures     Family Communication: Called to sister twice, left VM, no answer today.    Disposition: Status is: Inpatient 58 yo M with end stage malnutrition and dementia due to chronic alcoholism now worsened due to spinal epidural abscess this year which has made him completely paralyzed.    He has developed an unfortunate but expected and inevitable pressure sore leading to osteomyelitis.  This is not curable medically and amputation would be futile and in my opinion unethical as it would create immense pain, have low confidence of healing given malnutrition, and not prevent other pressure sores in the near future.  Have recommended hospice.  Sister POA is involved but unavailable today, Palliative Care are following.        Author: Lonni SHAUNNA Dalton, MD 06/29/2024 9:49 PM  For on call review [email or web address].

## 2024-06-29 NOTE — Plan of Care (Signed)
   Problem: Coping: Goal: Ability to adjust to condition or change in health will improve Outcome: Progressing

## 2024-06-30 ENCOUNTER — Encounter (HOSPITAL_COMMUNITY): Payer: Self-pay | Admitting: Student

## 2024-06-30 DIAGNOSIS — E11621 Type 2 diabetes mellitus with foot ulcer: Secondary | ICD-10-CM | POA: Diagnosis not present

## 2024-06-30 DIAGNOSIS — L97422 Non-pressure chronic ulcer of left heel and midfoot with fat layer exposed: Secondary | ICD-10-CM | POA: Diagnosis not present

## 2024-06-30 LAB — COMPREHENSIVE METABOLIC PANEL WITH GFR
ALT: 17 U/L (ref 0–44)
AST: 19 U/L (ref 15–41)
Albumin: 2.1 g/dL — ABNORMAL LOW (ref 3.5–5.0)
Alkaline Phosphatase: 120 U/L (ref 38–126)
Anion gap: 12 (ref 5–15)
BUN: 12 mg/dL (ref 6–20)
CO2: 23 mmol/L (ref 22–32)
Calcium: 8.6 mg/dL — ABNORMAL LOW (ref 8.9–10.3)
Chloride: 99 mmol/L (ref 98–111)
Creatinine, Ser: 0.51 mg/dL — ABNORMAL LOW (ref 0.61–1.24)
GFR, Estimated: >60 mL/min (ref >60)
Glucose, Bld: 230 mg/dL — ABNORMAL HIGH (ref 70–99)
Potassium: 4.3 mmol/L (ref 3.5–5.1)
Sodium: 134 mmol/L — ABNORMAL LOW (ref 135–145)
Total Bilirubin: 0.3 mg/dL (ref 0.0–1.2)
Total Protein: 6.5 g/dL (ref 6.5–8.1)

## 2024-06-30 LAB — CBC
HCT: 26.2 % — ABNORMAL LOW (ref 39.0–52.0)
Hemoglobin: 8.3 g/dL — ABNORMAL LOW (ref 13.0–17.0)
MCH: 28.5 pg (ref 26.0–34.0)
MCHC: 31.7 g/dL (ref 30.0–36.0)
MCV: 90 fL (ref 80.0–100.0)
Platelets: 416 K/uL — ABNORMAL HIGH (ref 150–400)
RBC: 2.91 MIL/uL — ABNORMAL LOW (ref 4.22–5.81)
RDW: 14.6 % (ref 11.5–15.5)
WBC: 7.5 K/uL (ref 4.0–10.5)
nRBC: 0 % (ref 0.0–0.2)

## 2024-06-30 LAB — GLUCOSE, CAPILLARY
Glucose-Capillary: 171 mg/dL — ABNORMAL HIGH (ref 70–99)
Glucose-Capillary: 384 mg/dL — ABNORMAL HIGH (ref 70–99)
Glucose-Capillary: 166 mg/dL — ABNORMAL HIGH (ref 70–99)
Glucose-Capillary: 137 mg/dL — ABNORMAL HIGH (ref 70–99)

## 2024-06-30 NOTE — Progress Notes (Signed)
 PROGRESS NOTE    Brandon [name]  FMW:[id number] DOB: Mar 18, 1966 DOA: 06/23/2024 PCP: Donal Channing SQUIBB, FNP  Brief Narrative:  This 58 yrs old Male with hx CVA, left hemiparesis, wheelchair and bedbound, 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 in Apr 2025 treated medically given baseline functional status, anxiety, seizures, DM, CAD last PCI >5 years, HTN, chronic hypotension on midodrine , hx chronic pancreatitis who was sent from [facility] for worsening foot wound. In the hospital, MRI foot confirmed osteomyelitis,  vascular consulted.  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 The extent of infection is not curable medically.   Vascular surgery consulted, ABI showed bilateral lower extremity vascular disease, vascular surgery recommended above-knee amputation. However, given his overall prognosis, the medical team strongly recommends against this. Hospice discussions are in process. - Continue Unasyn,and linezolid for now. - Consulted palliative care.     Dementia: Severe protein calorie malnutrition: Failure to thrive Cerebrovascular disease with left hemiplegia: History of recent epidural abscess April 2025 with cauda equina syndrome, now bilateral lower extremity contractures: -The patient was living at home until 2023. Early that year, he was already described as severely malnourished in the setting of alcoholism, diabetes, chronic pancreatitis, and requiring assistance with ambulating.  -A year later, he had moved in to LTC, was admitted from SNF with fall and IPH, had numerous pressure ulcers at that time.   -From that point on, he appears to have not been able to walk. -In April of this year, he was admitted  for emphysematous pyelonephritis, S pneumo/ESBL Klebsiella bacteremia and large inoperable epidural abscess.  This was treated medically, but by his hospital follow ups late spring, he was unable to turn himself on the gurney and showing crippling contractures in his arms and legs. -With regard to dementia, he has had multiple strokes, subsequent intraparenchymal hemorrhage.  In addition, MRI scans for the last several years have shown age-advanced cerebral atrophy, possibly alcohol  related, or premature dementia.     In totality, with his dementia, malnutrition, paralysis, and contractures, and now osteomyelitis, he appears to have terminal disease, I recommend transition to hospice and have made this recommendation to POA/sister, who is considering. -Continue baclofen, Plavix , Crestor , feeding supplements, vitamin C , MVI, mirtazapine, Buspar  and sertraline  for now      Peripheral vascular disease Long standing vascular disease.  ABI on left with moderate to severe disease. - Continue Plavix , Crestor .   Anemia: Due to chronic disease -Trend hemoglobin, Hb relatively stable.   Coronary artery disease -Continue Plavix , Crestor .   Diabetes Mellitus II Glucose low - Hold insulin  given fragile state.   Chronic hypotension: -Continue midodrine    Seizure disorder: No seizures observed. - Continue Keppra    Chronic pancreatitis: -Continue Creon    Decision making capacity: The patient does not demonstrate ordered, logical thinking, He is not able to articulate his medical problem, alternative treatment plans, or foreseeable consequences, due to dementia.  His sister has been identified by his facility as his next of kin and surrogate decision maker, no other POA is known.        DVT prophylaxis: Lovenox  Code Status: Full code Family Communication: No family at bed side. Disposition Plan:    Status is: Inpatient Remains inpatient appropriate because: Severity of illness.  Consultants:  Vascular surgery Palliative care  Procedures:   Antimicrobials:  Anti-infectives (From admission, onward)    Start     Dose/Rate Route Frequency Ordered Stop   06/23/24 2200  Ampicillin-Sulbactam (UNASYN) 3 g in sodium chloride  0.9 % 100 mL IVPB        3 g 200 mL/hr over 30 Minutes Intravenous Every 6 hours 06/23/24 1935     06/23/24 1934  linezolid (ZYVOX) IVPB 600 mg        600 mg 300 mL/hr over 60 Minutes Intravenous Every 12 hours 06/23/24 1935     06/23/24 1330  Ampicillin-Sulbactam (UNASYN) 3 g in sodium chloride  0.9 % 100 mL IVPB       Placed in And Linked Group   3 g 200 mL/hr over 30 Minutes Intravenous  Once 06/23/24 1321 06/23/24 1936   06/23/24 1330  linezolid (ZYVOX) tablet 600 mg       Placed in And Linked Group   600 mg Oral  Once 06/23/24 1321 06/23/24 1547       Subjective: Patient is seen and examined at bedside. Overnight events noted. Patient lying in the bed with visible contractures.  He is alert and oriented x 2, following commands.  Objective: Vitals:   06/30/24 0500 06/30/24 0523 06/30/24 0818 06/30/24 1022  BP:  95/60  101/65  Pulse:  67  68  Resp:  18  16  Temp:  97.6 F (36.4 C)  97.7 F (36.5 C)  TempSrc:  Oral  Oral  SpO2:  98%  100%  Weight: 66 kg     Height:   6' (1.829 m)     Intake/Output Summary (Last 24 hours) at 06/30/2024 1152 Last data filed at 06/30/2024 0043 Gross per 24 hour  Intake 236 ml  Output 1150 ml  Net -914 ml   Filed Weights   06/27/24 0500 06/27/24 0535 06/30/24 0500  Weight: 62 kg 62.3 kg 66 kg    Examination:  General exam: Malnourished, dishelved , cachectic, deconditioned, frail, with contractures Respiratory system: Clear to auscultation. Respiratory effort normal. RR 12 Cardiovascular system: S1 & S2 heard, RRR. No JVD, murmurs, rubs, gallops or clicks.  Gastrointestinal system: Abdomen is non distended, soft and nontender. Normal bowel sounds heard. Central nervous system:  Alert , oriented x 2.patient no focal neurological deficits. Extremities: No edema, no cyanosis.  Visible contractures Skin: No rashes, lesions or ulcers Psychiatry: not assessed.    Data Reviewed: I have personally reviewed following labs and imaging studies  CBC: Recent Labs  Lab 06/23/24 1458 06/24/24 0437 06/25/24 0431 06/26/24 1019 06/30/24 0437  WBC 11.1* 7.9 7.8 7.4 7.5  NEUTROABS 8.8*  --   --   --   --   HGB 9.1* 8.0* 8.2* 8.2* 8.3*  HCT 28.9* 26.0* 26.6* 26.3* 26.2*  MCV 91.5 91.2 92.0 90.4 90.0  PLT 400 369 374 407* 416*   Basic Metabolic Panel: Recent Labs  Lab 06/23/24 1458 06/24/24 0437 06/25/24 0431 06/26/24 1019 06/30/24 0437  NA 135 135 139 139 134*  K 4.4 3.7 3.7 4.1 4.3  CL 100 103 104 106 99  CO2 21* 21* 23 26 23   GLUCOSE 155* 222* 151* 78 230*  BUN 16 12 5* <5* 12  CREATININE 0.60* 0.51* 0.41* 0.45* 0.51*  CALCIUM  9.4 8.8* 9.1 8.7* 8.6*  MG  --   --  1.7 1.5*  --   PHOS  --   --  3.3 4.3  --  GFR: Estimated Creatinine Clearance: 91.7 mL/min (A) (by C-G formula based on SCr of 0.51 mg/dL (L)). Liver Function Tests: Recent Labs  Lab 06/23/24 1458 06/30/24 0437  AST 37 19  ALT 23 17  ALKPHOS 185* 120  BILITOT 0.3 0.3  PROT 7.3 6.5  ALBUMIN  3.2* 2.1*   No results for input(s): LIPASE, AMYLASE in the last 168 hours. No results for input(s): AMMONIA in the last 168 hours. Coagulation Profile: No results for input(s): INR, PROTIME in the last 168 hours. Cardiac Enzymes: No results for input(s): CKTOTAL, CKMB, CKMBINDEX, TROPONINI in the last 168 hours. BNP (last 3 results) No results for input(s): PROBNP in the last 8760 hours. HbA1C: No results for input(s): HGBA1C in the last 72 hours. CBG: Recent Labs  Lab 06/29/24 0746 06/29/24 1211 06/29/24 1707 06/29/24 2047 06/30/24 0752  GLUCAP 95 156* 158* 195* 171*   Lipid Profile: No results for input(s): CHOL, HDL, LDLCALC, TRIG, CHOLHDL,  LDLDIRECT in the last 72 hours. Thyroid  Function Tests: No results for input(s): TSH, T4TOTAL, FREET4, T3FREE, THYROIDAB in the last 72 hours. Anemia Panel: No results for input(s): VITAMINB12, FOLATE, FERRITIN, TIBC, IRON, RETICCTPCT in the last 72 hours. Sepsis Labs: Recent Labs  Lab 06/23/24 1457  LATICACIDVEN 1.3    Recent Results (from the past 240 hours)  Blood Cultures x 2 sites     Status: None   Collection Time: 06/23/24  2:58 PM   Specimen: BLOOD LEFT ARM  Result Value Ref Range Status   Specimen Description   Final    BLOOD LEFT ARM Performed at [facility], [address], [place], [place] [address]    Special Requests   Final    BOTTLES DRAWN AEROBIC AND ANAEROBIC Blood Culture adequate volume Performed at [facility], [address], [place], [place] [address]    Culture   Final    NO GROWTH 5 DAYS Performed at [facility], [address] [place], [place] [address]    Report Status 06/28/2024 FINAL  Final  Blood Cultures x 2 sites     Status: None   Collection Time: 06/23/24  2:58 PM   Specimen: BLOOD LEFT ARM  Result Value Ref Range Status   Specimen Description   Final    BLOOD LEFT ARM Performed at [facility], [address], [place], [place] [address]    Special Requests   Final    BOTTLES DRAWN AEROBIC AND ANAEROBIC Blood Culture results may not be optimal due to an inadequate volume of blood received in culture bottles Performed at [facility], [address] [place], [place] [address]    Culture   Final    NO GROWTH 5 DAYS Performed at [facility], [address] [place], [place] [address]    Report Status 06/28/2024 FINAL  Final   Radiology Studies: No results found.  Scheduled Meds:  ascorbic acid   500 mg Oral Daily   baclofen  20 mg Oral TID   busPIRone   10 mg Oral BID   clopidogrel   75 mg Oral Daily   enoxaparin   (LOVENOX ) injection  40 mg Subcutaneous Q24H   famotidine   20 mg Oral BID   ferrous sulfate   325 mg Oral Q breakfast   insulin  aspart  0-5 Units Subcutaneous QHS   insulin  aspart  0-9 Units Subcutaneous TID WC   insulin  glargine  10 Units Subcutaneous Daily   levETIRAcetam   500 mg Oral BID  lipase/protease/amylase  12,000 Units Oral TID WC   melatonin  3 mg Oral QHS   midodrine   10 mg Oral TID WC   mirtazapine  7.5 mg Oral QHS   multivitamin with minerals  1 tablet Oral Daily   mupirocin  ointment  1 Application Topical Daily   rosuvastatin   5 mg Oral QHS   senna  2 tablet Oral BID   sertraline   100 mg Oral Daily   tamsulosin   0.4 mg Oral QHS   Continuous Infusions:  ampicillin-sulbactam (UNASYN) IV 3 g (06/30/24 0458)   linezolid (ZYVOX) IV 600 mg (06/30/24 0945)     LOS: 7 days    Time spent: 50 mins    Darcel Dawley, MD [facility]   If 7PM-7AM, please contact night-coverage

## 2024-06-30 NOTE — Plan of Care (Signed)

## 2024-06-30 NOTE — Plan of Care (Signed)
   Problem: Skin Integrity: Goal: Risk for impaired skin integrity will decrease Outcome: Progressing

## 2024-07-01 DIAGNOSIS — E11621 Type 2 diabetes mellitus with foot ulcer: Secondary | ICD-10-CM | POA: Diagnosis not present

## 2024-07-01 DIAGNOSIS — L97422 Non-pressure chronic ulcer of left heel and midfoot with fat layer exposed: Secondary | ICD-10-CM | POA: Diagnosis not present

## 2024-07-01 LAB — GLUCOSE, CAPILLARY
Glucose-Capillary: 303 mg/dL — ABNORMAL HIGH (ref 70–99)
Glucose-Capillary: 249 mg/dL — ABNORMAL HIGH (ref 70–99)
Glucose-Capillary: 231 mg/dL — ABNORMAL HIGH (ref 70–99)
Glucose-Capillary: 185 mg/dL — ABNORMAL HIGH (ref 70–99)

## 2024-07-01 MED ORDER — INSULIN ASPART 100 UNIT/ML IJ SOLN
0.0000 [IU] | Freq: Three times a day (TID) | INTRAMUSCULAR | Status: DC
  Administered 2024-07-01 – 2024-07-02 (×4): 5 [IU] via SUBCUTANEOUS
  Administered 2024-07-03: 8 [IU] via SUBCUTANEOUS
  Administered 2024-07-03: 3 [IU] via SUBCUTANEOUS
  Administered 2024-07-04: 11 [IU] via SUBCUTANEOUS
  Administered 2024-07-05: 15 [IU] via SUBCUTANEOUS
  Administered 2024-07-06: 3 [IU] via SUBCUTANEOUS
  Administered 2024-07-06: 5 [IU] via SUBCUTANEOUS
  Administered 2024-07-06 – 2024-07-07 (×2): 8 [IU] via SUBCUTANEOUS

## 2024-07-01 MED ORDER — INSULIN GLARGINE 100 UNIT/ML ~~LOC~~ SOLN
14.0000 [IU] | Freq: Every day | SUBCUTANEOUS | Status: DC
Start: 2024-07-02 — End: 2024-07-07
  Administered 2024-07-02 – 2024-07-07 (×6): 14 [IU] via SUBCUTANEOUS
  Filled 2024-07-01 (×6): qty 0.14

## 2024-07-01 MED ORDER — ZINC SULFATE 220 (50 ZN) MG PO CAPS
220.0000 mg | ORAL_CAPSULE | Freq: Every day | ORAL | Status: DC
  Administered 2024-07-02 – 2024-07-07 (×6): 220 mg via ORAL
  Filled 2024-07-01 (×6): qty 1

## 2024-07-01 MED ORDER — INSULIN ASPART 100 UNIT/ML IJ SOLN
0.0000 [IU] | Freq: Every day | INTRAMUSCULAR | Status: DC
  Administered 2024-07-02 – 2024-07-03 (×2): 2 [IU] via SUBCUTANEOUS
  Administered 2024-07-04 – 2024-07-06 (×2): 3 [IU] via SUBCUTANEOUS

## 2024-07-01 MED ORDER — GLUCERNA SHAKE PO LIQD
237.0000 mL | Freq: Three times a day (TID) | ORAL | Status: DC
  Administered 2024-07-01 – 2024-07-07 (×18): 237 mL via ORAL

## 2024-07-01 NOTE — Progress Notes (Signed)
 Nutrition Follow-up  DOCUMENTATION CODES:   Severe malnutrition in context of chronic illness  INTERVENTION:  Encourage Po intake - Regular diet, thin liquids Room service with assist Nursing to assist with feeding during meals Glucerna Shake po TID, each supplement provides 220 kcal and 10 grams of protein Mighty Shake TID with meals, each supplement provides 330 kcals and 9 grams of protein Continue Vitamin C  and Multivitamin w/ minerals daily Continue with Creon  with meals and snacks  Add 220 mg Zinc x 15 days for wound healing    NUTRITION DIAGNOSIS:   Severe Malnutrition related to chronic illness as evidenced by severe muscle depletion, severe fat depletion (Has lost 21 lbs, 13% in 11 months.).  GOAL:   Patient will meet greater than or equal to 90% of their needs  MONITOR:   PO intake, Supplement acceptance, Labs, Skin  REASON FOR ASSESSMENT:   Consult Assessment of nutrition requirement/status  ASSESSMENT:  58 y.o. male presented to the ED with worsening foot ulcer from SNF. PMH includes CVA w/ R-sde deficets, epidural abscess April 2025 with cauda equina syndrome,sevre contractures in all 4 extremitites, GERD, CAD, HLD, T2DM, HTN, Dementia, and chronic pancreatitis. Pt admitted with diabetic foot ulcer and osteomyelitis.   Pt from [facility], is a poor historian. Pt appears cachectic, deconditioned, and frail with contractures in all 4 extremities. Pt reports he did not receive good care at his SNF PTA. He reports he has an appetite and wants to eat but the food was not good so he had to eat to survive. Was not eating well at SNF. Pt has been doing well with PO intake here eating mostly 100% of his meals. Needs assistance during meals due to contractures in all four extremities.   Pt with severe muscle and fat wasting. Has lost 21 lbs, 13% in 11 months. Pt with diabetic left foot ulcer that has lead to osteomyelitis, Per MD pt is not a candidate for amputation due  to serve malnutrition. Palliative following for GOC, MD recommend Hospice.   Pt eating well during admission, has been drinking the Mighty shakes coming on his tray. Will add additional ONS to help with PO intake. On Creon  with meals. RD will follow along for GOC.   Admit weight: 60.9 kg Current weight: 63 kg *160 lbs in 07/2023, -21 lbs, 13% in 11 months    Average Meal Intake: 10/5-10/9: 100% intake x 7 recorded meals  Nutritionally Relevant Medications: Scheduled Meds:  ascorbic acid   500 mg Oral Daily   famotidine   20 mg Oral BID   feeding supplement (GLUCERNA SHAKE)  237 mL Oral TID BM   ferrous sulfate   325 mg Oral Q breakfast   insulin  aspart  0-5 Units Subcutaneous QHS   insulin  aspart  0-9 Units Subcutaneous TID WC   insulin  glargine  10 Units Subcutaneous Daily   lipase/protease/amylase  12,000 Units Oral TID WC   mirtazapine  7.5 mg Oral QHS   multivitamin with minerals  1 tablet Oral Daily   [START ON 07/02/2024] zinc sulfate (50mg  elemental zinc)  220 mg Oral Daily   Continuous Infusions:  ampicillin-sulbactam (UNASYN) IV 3 g (07/01/24 0924)   linezolid (ZYVOX) IV 600 mg (07/01/24 0930)   Labs Reviewed: Sodium 134 Creatinine 0.51 CBG ranges from 95-303 mg/dL over the last 24 hours HgbA1c 9.5  NUTRITION - FOCUSED PHYSICAL EXAM:  Flowsheet Row Most Recent Value  Orbital Region Severe depletion  Upper Arm Region Severe depletion  Thoracic and Lumbar Region  Severe depletion  Buccal Region Severe depletion  Temple Region Severe depletion  Clavicle Bone Region Severe depletion  Clavicle and Acromion Bone Region Severe depletion  Scapular Bone Region Severe depletion  Dorsal Hand Severe depletion  Patellar Region Severe depletion  Anterior Thigh Region Severe depletion  Posterior Calf Region Severe depletion  Edema (RD Assessment) None  Hair Reviewed  Eyes Reviewed  Mouth Reviewed  Skin Reviewed  Nails Reviewed    Diet Order:   Diet Order              Diet regular Room service appropriate? Yes with Assist; Fluid consistency: Thin  Diet effective now                   EDUCATION NEEDS:   Not appropriate for education at this time  Skin:  Skin Assessment: Skin Integrity Issues: Skin Integrity Issues:: Diabetic Ulcer Diabetic Ulcer: L heel  Last BM:  10/7 x 1  Height:   Ht Readings from Last 1 Encounters:  06/30/24 6' (1.829 m)    Weight:   Wt Readings from Last 1 Encounters:  07/01/24 63 kg    Ideal Body Weight:  80.9 kg  BMI:  Body mass index is 18.84 kg/m.  Estimated Nutritional Needs:   Kcal:  1900-2200 kcal  Protein:  100-120 gm  Fluid:  >2L/day   Olivia Kenning, RD Registered Dietitian  See Amion for more information

## 2024-07-01 NOTE — Plan of Care (Signed)
   Problem: Education: Goal: Ability to describe self-care measures that may prevent or decrease complications (Diabetes Survival Skills Education) will improve Outcome: Progressing

## 2024-07-01 NOTE — Progress Notes (Signed)
 MEWS Progress Note  Patient Details Name: LADANIAN KELTER MRN: [id number] DOB: 04-26-1966 Today's Date: 07/01/2024   MEWS Flowsheet Documentation:  Assess: MEWS Score Temp: 98.2 F (36.8 C) BP: 100/62 MAP (mmHg): 75 Pulse Rate: 65 ECG Heart Rate: 82 Resp: 17 Level of Consciousness: Alert SpO2: 96 % O2 Device: Room Air Assess: MEWS Score MEWS Temp: 0 MEWS Systolic: 1 MEWS Pulse: 0 MEWS RR: 0 MEWS LOC: 0 MEWS Score: 1 MEWS Score Color: Green Assess: SIRS CRITERIA SIRS Temperature : 0 SIRS Respirations : 0 SIRS Pulse: 0 SIRS WBC: 0 SIRS Score Sum : 0 SIRS Temperature : 0 SIRS Pulse: 0 SIRS Respirations : 0 SIRS WBC: 0 SIRS Score Sum : 0 Assess: if the MEWS score is Yellow or Red Were vital signs accurate and taken at a resting state?: Yes Does the patient meet 2 or more of the SIRS criteria?: No MEWS guidelines implemented : Yes, yellow Treat MEWS Interventions: Considered administering scheduled or prn medications/treatments as ordered Take Vital Signs Increase Vital Sign Frequency : Yellow: Q2hr x1, continue Q4hrs until patient remains green for 12hrs Escalate MEWS: Escalate: Yellow: Discuss with charge nurse and consider notifying provider and/or RRT   Patient not in any distress.     Zaveon Gillen K Lamaya Hyneman 07/01/2024, 8:52 AM

## 2024-07-01 NOTE — Inpatient Diabetes Management (Signed)
 Inpatient Diabetes Program Recommendations  AACE/ADA: New Consensus Statement on Inpatient Glycemic Control (2015)  Target Ranges:  Prepandial:   less than 140 mg/dL      Peak postprandial:   less than 180 mg/dL (1-2 hours)      Critically ill patients:  140 - 180 mg/dL   Lab Results  Component Value Date   GLUCAP 249 (H) 07/01/2024   HGBA1C 9.5 (H) 06/24/2024    Review of Glycemic Control  Latest Reference Range & Units 06/30/24 07:52 06/30/24 12:05 06/30/24 17:15 06/30/24 22:26 07/01/24 08:25 07/01/24 11:57  Glucose-Capillary 70 - 99 mg/dL 828 (H) 615 (H) 833 (H) 137 (H) 303 (H) 249 (H)   Diabetes history: DM 2 Outpatient Diabetes medications:  Lantus  14 units q AM Humalog 0-10 units tid Metformin  500 mg bid Current orders for Inpatient glycemic control:  Novolog  0-9 units tid with meals and HS Lantus  10 units daily Inpatient Diabetes Program Recommendations:    Consider increasing Lantus  to 14 units daily and consider increasing Novolog  correction to moderate tid with meals and HS.    Thanks,  Randall Bullocks, RN, BC-ADM Inpatient Diabetes Coordinator Pager [phone]  (8a-5p)

## 2024-07-01 NOTE — Progress Notes (Signed)
 PROGRESS NOTE    Brandon [name]  FMW:[id number] DOB: 05/16/1966 DOA: 06/23/2024 PCP: Donal Channing SQUIBB, FNP  Brief Narrative:  This 58 yrs old Male with hx CVA, left hemiparesis, wheelchair and bedbound, 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 in Apr 2025 treated medically given baseline functional status, anxiety, seizures, DM, CAD last PCI >5 years, HTN, chronic hypotension on midodrine , hx chronic pancreatitis who was sent from [facility] for worsening foot wound. In the hospital, MRI foot confirmed osteomyelitis,  vascular consulted.  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 The extent of infection is not curable medically.   Vascular surgery consulted, ABI showed bilateral lower extremity vascular disease, vascular surgery recommended above-knee amputation. However, given his overall prognosis, the medical team strongly recommends against this. Hospice discussions are in process. - Continue Unasyn,and linezolid for now. - Consulted palliative care.   Dementia: Severe protein calorie malnutrition: Failure to thrive Cerebrovascular disease with left hemiplegia: History of recent epidural abscess April 2025 with cauda equina syndrome, now bilateral lower extremity contractures: -The patient was living at home until 2023. Early that year, he was already described as severely malnourished in the setting of alcoholism, diabetes, chronic pancreatitis, and requiring assistance with ambulating.  -A year later, he had moved in to LTC, was admitted from SNF with fall and IPH, had numerous pressure ulcers at that time.   -From that point on, he appears to have not been able to walk. -In April of this year, he was admitted for  emphysematous pyelonephritis, S pneumo/ESBL Klebsiella bacteremia and large inoperable epidural abscess.  This was treated medically, but by his hospital follow ups late spring, he was unable to turn himself on the gurney and showing crippling contractures in his arms and legs. -With regard to dementia, he has had multiple strokes, subsequent intraparenchymal hemorrhage.  In addition, MRI scans for the last several years have shown age-advanced cerebral atrophy, possibly alcohol  related, or premature dementia.     In totality, with his dementia, malnutrition, paralysis, and contractures, and now osteomyelitis, he appears to have terminal disease, I recommend transition to hospice and have made this recommendation to POA/sister, who is considering. -Continue baclofen, Plavix , Crestor , feeding supplements, vitamin C , MVI, mirtazapine, Buspar  and sertraline  for now      Peripheral vascular disease: Long standing vascular disease.  ABI on left with moderate to severe disease. - Continue Plavix , Crestor .   Anemia: Due to chronic disease -Trend hemoglobin, Hb relatively stable.   Coronary artery disease -Continue Plavix , Crestor .   Diabetes Mellitus II Glucose low - Hold insulin  given fragile state.   Chronic hypotension: -Continue midodrine    Seizure disorder: No seizures observed. - Continue Keppra    Chronic pancreatitis: -Continue Creon    Decision making capacity: The patient does not demonstrate ordered, logical thinking, He is not able to articulate his medical problem, alternative treatment plans, or foreseeable consequences, due to dementia.  His sister has been identified by his facility as his next of kin and surrogate decision maker, no other POA is known.        DVT prophylaxis: Lovenox  Code Status: Full code Family Communication: No family at bed side. Disposition Plan:    Status is: Inpatient Remains inpatient appropriate because: Severity of illness.  Consultants:  Vascular surgery Palliative care  Procedures:   Antimicrobials:  Anti-infectives (From admission, onward)    Start     Dose/Rate Route Frequency Ordered Stop   06/23/24 2200  Ampicillin-Sulbactam (UNASYN) 3 g in sodium chloride  0.9 % 100 mL IVPB        3 g 200 mL/hr over 30 Minutes Intravenous Every 6 hours 06/23/24 1935     06/23/24 1934  linezolid (ZYVOX) IVPB 600 mg        600 mg 300 mL/hr over 60 Minutes Intravenous Every 12 hours 06/23/24 1935     06/23/24 1330  Ampicillin-Sulbactam (UNASYN) 3 g in sodium chloride  0.9 % 100 mL IVPB       Placed in And Linked Group   3 g 200 mL/hr over 30 Minutes Intravenous  Once 06/23/24 1321 06/23/24 1936   06/23/24 1330  linezolid (ZYVOX) tablet 600 mg       Placed in And Linked Group   600 mg Oral  Once 06/23/24 1321 06/23/24 1547       Subjective: Patient is seen and examined at bedside. Overnight events noted. Patient lying in the bed with visible contractures.  He was lying on his left side.  He is alert and oriented x 2, following commands.  Objective: Vitals:   06/30/24 2227 07/01/24 0500 07/01/24 0526 07/01/24 1100  BP: 99/68  100/62 104/75  Pulse: (!) 59  65 66  Resp: 17     Temp: 98 F (36.7 C)  98.2 F (36.8 C) 98.6 F (37 C)  TempSrc: Oral  Oral Oral  SpO2: 98%  96% 99%  Weight:  63 kg    Height:        Intake/Output Summary (Last 24 hours) at 07/01/2024 1140 Last data filed at 07/01/2024 0700 Gross per 24 hour  Intake 240 ml  Output 1500 ml  Net -1260 ml   Filed Weights   06/27/24 0535 06/30/24 0500 07/01/24 0500  Weight: 62.3 kg 66 kg 63 kg    Examination:  General exam: Malnourished, dishelved , cachectic, deconditioned, frail, with contractures. Respiratory system: CTA Bilaterally. Respiratory effort normal. RR 14 Cardiovascular system: S1 & S2 heard, RRR. No JVD, murmurs, rubs, gallops or clicks.  Gastrointestinal system: Abdomen is non distended, soft and nontender. Normal  bowel sounds heard. Central nervous system: Alert , oriented x 2.  no focal neurological deficits. Extremities: No edema, no cyanosis.  Visible contractures Skin: No rashes, lesions or ulcers Psychiatry: not assessed.    Data Reviewed: I have personally reviewed following labs and imaging studies  CBC: Recent Labs  Lab 06/25/24 0431 06/26/24 1019 06/30/24 0437  WBC 7.8 7.4 7.5  HGB 8.2* 8.2* 8.3*  HCT 26.6* 26.3* 26.2*  MCV 92.0 90.4 90.0  PLT 374 407* 416*   Basic Metabolic Panel: Recent Labs  Lab 06/25/24 0431 06/26/24 1019 06/30/24 0437  NA 139 139 134*  K 3.7 4.1 4.3  CL 104 106 99  CO2 23 26 23   GLUCOSE 151* 78 230*  BUN 5* <5* 12  CREATININE 0.41* 0.45* 0.51*  CALCIUM  9.1 8.7* 8.6*  MG 1.7 1.5*  --   PHOS 3.3 4.3  --    GFR: Estimated Creatinine Clearance: 87.5 mL/min (A) (by C-G formula based on SCr of 0.51 mg/dL (L)). Liver Function Tests: Recent Labs  Lab 06/30/24 0437  AST 19  ALT 17  ALKPHOS 120  BILITOT 0.3  PROT 6.5  ALBUMIN  2.1*   No results for input(s): LIPASE,  AMYLASE in the last 168 hours. No results for input(s): AMMONIA in the last 168 hours. Coagulation Profile: No results for input(s): INR, PROTIME in the last 168 hours. Cardiac Enzymes: No results for input(s): CKTOTAL, CKMB, CKMBINDEX, TROPONINI in the last 168 hours. BNP (last 3 results) No results for input(s): PROBNP in the last 8760 hours. HbA1C: No results for input(s): HGBA1C in the last 72 hours. CBG: Recent Labs  Lab 06/30/24 0752 06/30/24 1205 06/30/24 1715 06/30/24 2226 07/01/24 0825  GLUCAP 171* 384* 166* 137* 303*   Lipid Profile: No results for input(s): CHOL, HDL, LDLCALC, TRIG, CHOLHDL, LDLDIRECT in the last 72 hours. Thyroid  Function Tests: No results for input(s): TSH, T4TOTAL, FREET4, T3FREE, THYROIDAB in the last 72 hours. Anemia Panel: No results for input(s): VITAMINB12, FOLATE, FERRITIN, TIBC,  IRON, RETICCTPCT in the last 72 hours. Sepsis Labs: No results for input(s): PROCALCITON, LATICACIDVEN in the last 168 hours.   Recent Results (from the past 240 hours)  Blood Cultures x 2 sites     Status: None   Collection Time: 06/23/24  2:58 PM   Specimen: BLOOD LEFT ARM  Result Value Ref Range Status   Specimen Description   Final    BLOOD LEFT ARM Performed at [facility], [address], [place], [place] [address]    Special Requests   Final    BOTTLES DRAWN AEROBIC AND ANAEROBIC Blood Culture adequate volume Performed at [facility], [address] [place], [place] [address]    Culture   Final    NO GROWTH 5 DAYS Performed at [facility], [address] [place], [place] [address]    Report Status 06/28/2024 FINAL  Final  Blood Cultures x 2 sites     Status: None   Collection Time: 06/23/24  2:58 PM   Specimen: BLOOD LEFT ARM  Result Value Ref Range Status   Specimen Description   Final    BLOOD LEFT ARM Performed at [facility], [address], [place], [place] [address]    Special Requests   Final    BOTTLES DRAWN AEROBIC AND ANAEROBIC Blood Culture results may not be optimal due to an inadequate volume of blood received in culture bottles Performed at [facility], [address], [place], [place] [address]    Culture   Final    NO GROWTH 5 DAYS Performed at [facility], [address] [place], [place] [address]    Report Status 06/28/2024 FINAL  Final   Radiology Studies: No results found.  Scheduled Meds:  ascorbic acid   500 mg Oral Daily   baclofen  20 mg Oral TID   busPIRone   10 mg Oral BID   clopidogrel   75 mg Oral Daily   enoxaparin  (LOVENOX ) injection  40 mg Subcutaneous Q24H   famotidine   20 mg Oral BID   feeding supplement (GLUCERNA SHAKE)  237 mL Oral TID BM   ferrous sulfate   325 mg Oral Q breakfast   insulin  aspart  0-5 Units Subcutaneous  QHS   insulin  aspart  0-9 Units Subcutaneous TID WC   insulin  glargine  10 Units Subcutaneous Daily   levETIRAcetam   500 mg Oral BID   lipase/protease/amylase  12,000 Units Oral TID WC   melatonin  3 mg Oral QHS   midodrine   10 mg Oral TID WC   mirtazapine  7.5 mg Oral QHS   multivitamin with minerals  1 tablet Oral Daily  mupirocin  ointment  1 Application Topical Daily   rosuvastatin   5 mg Oral QHS   senna  2 tablet Oral BID   sertraline   100 mg Oral Daily   tamsulosin   0.4 mg Oral QHS   [START ON 07/02/2024] zinc sulfate (50mg  elemental zinc)  220 mg Oral Daily   Continuous Infusions:  ampicillin-sulbactam (UNASYN) IV 3 g (07/01/24 0924)   linezolid (ZYVOX) IV 600 mg (07/01/24 0930)     LOS: 8 days    Time spent: 35 mins    Darcel Dawley, MD [facility]   If 7PM-7AM, please contact night-coverage

## 2024-07-02 DIAGNOSIS — L97422 Non-pressure chronic ulcer of left heel and midfoot with fat layer exposed: Secondary | ICD-10-CM | POA: Diagnosis not present

## 2024-07-02 DIAGNOSIS — E11621 Type 2 diabetes mellitus with foot ulcer: Secondary | ICD-10-CM | POA: Diagnosis not present

## 2024-07-02 LAB — GLUCOSE, CAPILLARY
Glucose-Capillary: 211 mg/dL — ABNORMAL HIGH (ref 70–99)
Glucose-Capillary: 222 mg/dL — ABNORMAL HIGH (ref 70–99)
Glucose-Capillary: 243 mg/dL — ABNORMAL HIGH (ref 70–99)
Glucose-Capillary: 235 mg/dL — ABNORMAL HIGH (ref 70–99)

## 2024-07-02 MED ORDER — AMOXICILLIN-POT CLAVULANATE 875-125 MG PO TABS
1.0000 | ORAL_TABLET | Freq: Two times a day (BID) | ORAL | Status: DC
  Administered 2024-07-02 – 2024-07-07 (×10): 1 via ORAL
  Filled 2024-07-02 (×10): qty 1

## 2024-07-02 MED ORDER — DOXYCYCLINE HYCLATE 100 MG PO TABS
100.0000 mg | ORAL_TABLET | Freq: Two times a day (BID) | ORAL | Status: DC
  Administered 2024-07-02 – 2024-07-07 (×10): 100 mg via ORAL
  Filled 2024-07-02 (×10): qty 1

## 2024-07-02 NOTE — Plan of Care (Signed)

## 2024-07-02 NOTE — Care Plan (Signed)
 Patient refused vitals. Health education rendered, but he refused it by saying leave me goddamn alone.

## 2024-07-02 NOTE — Progress Notes (Signed)
 Informed sister who is at bedside feeding pt that the Palliative team was trying to reach her and was unsuccessful and that if she would return their calls either tomorrow or the first of the week to discuss further plans regarding care that would be very helpful. She stated she would do so.

## 2024-07-02 NOTE — Progress Notes (Signed)
 PROGRESS NOTE    Brandon [name]  FMW:[id number] DOB: 1966/04/03 DOA: 06/23/2024 PCP: Donal Channing SQUIBB, FNP  Brief Narrative:  This 58 yrs old Male with hx CVA, left hemiparesis, wheelchair and bedbound, 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 in Apr 2025 treated medically given baseline functional status, anxiety, seizures, DM, CAD last PCI >5 years, HTN, chronic hypotension on midodrine , hx chronic pancreatitis who was sent from [facility] for worsening foot wound. In the hospital, MRI foot confirmed osteomyelitis,  vascular consulted.  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 The extent of infection is not curable medically.   Vascular surgery consulted, ABI showed bilateral lower extremity vascular disease, vascular surgery recommended above-knee amputation. However, given his overall prognosis, the medical team strongly recommends against this. Hospice discussions are in process. - Continue Unasyn,and linezolid for now. - Consulted palliative care.   Dementia: Severe protein calorie malnutrition: Failure to thrive Cerebrovascular disease with left hemiplegia: History of recent epidural abscess April 2025 with cauda equina syndrome, now bilateral lower extremity contractures: -The patient was living at home until 2023. Early that year, he was already described as severely malnourished in the setting of alcoholism, diabetes, chronic pancreatitis, and requiring assistance with ambulating.  -A year later, he had moved in to LTC, was admitted from SNF with fall and IPH, had numerous pressure ulcers at that time.   -From that point on, he appears to have not been able to walk. -In April of this year, he was admitted for  emphysematous pyelonephritis, S pneumo/ESBL Klebsiella bacteremia and large inoperable epidural abscess.  This was treated medically, but by his hospital follow ups late spring, he was unable to turn himself on the gurney and showing crippling contractures in his arms and legs. -With regard to dementia, he has had multiple strokes, subsequent intraparenchymal hemorrhage.  In addition, MRI scans for the last several years have shown age-advanced cerebral atrophy, possibly alcohol  related, or premature dementia.     In totality, with his dementia, malnutrition, paralysis, and contractures, and now osteomyelitis, he appears to have terminal disease, I recommend transition to hospice and have made this recommendation to POA/sister, who is considering. -Continue baclofen, Plavix , Crestor , feeding supplements, vitamin C , MVI, mirtazapine, Buspar  and sertraline  for now    Peripheral vascular disease: Brandon standing vascular disease.  ABI on left with moderate to severe disease. - Continue Plavix , Crestor .   Anemia: Due to chronic disease. -Trend hemoglobin, Hb relatively stable.   Coronary artery disease -Continue Plavix , Crestor .   Diabetes Mellitus II Continue Lantus  14 units daily Continue sliding scale.   Chronic hypotension: -Continue midodrine    Seizure disorder: No seizures observed. - Continue Keppra    Chronic pancreatitis: -Continue Creon    Decision making capacity: The patient does not demonstrate ordered, logical thinking, He is not able to articulate his medical problem, alternative treatment plans, or foreseeable consequences, due to dementia.  His sister has been identified by his facility as his next of kin and surrogate decision maker, no other POA is known.        DVT prophylaxis: Lovenox  Code Status: Full code Family Communication: No family at bed side. Disposition Plan:    Status is: Inpatient Remains inpatient appropriate because: Severity of illness.     Consultants:  Vascular surgery Palliative care  Procedures:   Antimicrobials:  Anti-infectives (From admission, onward)    Start     Dose/Rate Route Frequency Ordered Stop   06/23/24 2200  Ampicillin-Sulbactam (UNASYN) 3 g in sodium chloride  0.9 % 100 mL IVPB        3 g 200 mL/hr over 30 Minutes Intravenous Every 6 hours 06/23/24 1935     06/23/24 1934  linezolid (ZYVOX) IVPB 600 mg        600 mg 300 mL/hr over 60 Minutes Intravenous Every 12 hours 06/23/24 1935     06/23/24 1330  Ampicillin-Sulbactam (UNASYN) 3 g in sodium chloride  0.9 % 100 mL IVPB       Placed in And Linked Group   3 g 200 mL/hr over 30 Minutes Intravenous  Once 06/23/24 1321 06/23/24 1936   06/23/24 1330  linezolid (ZYVOX) tablet 600 mg       Placed in And Linked Group   600 mg Oral  Once 06/23/24 1321 06/23/24 1547       Subjective: Patient is seen and examined at bedside. Overnight events noted. Patient lying in the bed with visible contractures.   He was lying on his left side.  He is alert and oriented x 2, following commands.  Objective: Vitals:   07/01/24 1100 07/01/24 1726 07/01/24 2151 07/02/24 0700  BP: 104/75 121/73 104/70 (!) 88/62  Pulse: 66 62 62 73  Resp:  18 17 18   Temp: 98.6 F (37 C) 97.7 F (36.5 C) 97.7 F (36.5 C) 97.9 F (36.6 C)  TempSrc: Oral Oral Oral Oral  SpO2: 99% 97% (!) 81% 94%  Weight:      Height:        Intake/Output Summary (Last 24 hours) at 07/02/2024 1323 Last data filed at 07/02/2024 0900 Gross per 24 hour  Intake 730 ml  Output 751 ml  Net -21 ml   Filed Weights   06/27/24 0535 06/30/24 0500 07/01/24 0500  Weight: 62.3 kg 66 kg 63 kg    Examination:  General exam: Malnourished, dishelved , cachectic, deconditioned, frail, with contractures. Respiratory system: CTA Bilaterally. Respiratory effort normal. RR 13 Cardiovascular system: S1 & S2 heard, RRR. No JVD, murmurs, rubs, gallops or clicks.  Gastrointestinal system: Abdomen is non  distended, soft and nontender. Normal bowel sounds heard. Central nervous system: Alert , oriented x 2.  no focal neurological deficits. Extremities: No edema, no cyanosis.  Visible contractures Skin: No rashes, lesions or ulcers Psychiatry: not assessed.  Data Reviewed: I have personally reviewed following labs and imaging studies  CBC: Recent Labs  Lab 06/26/24 1019 06/30/24 0437  WBC 7.4 7.5  HGB 8.2* 8.3*  HCT 26.3* 26.2*  MCV 90.4 90.0  PLT 407* 416*   Basic Metabolic Panel: Recent Labs  Lab 06/26/24 1019 06/30/24 0437  NA 139 134*  K 4.1 4.3  CL 106 99  CO2 26 23  GLUCOSE 78 230*  BUN <5* 12  CREATININE 0.45* 0.51*  CALCIUM  8.7* 8.6*  MG 1.5*  --   PHOS 4.3  --    GFR: Estimated Creatinine Clearance: 87.5 mL/min (A) (by C-G formula based on SCr of 0.51 mg/dL (L)). Liver Function Tests: Recent Labs  Lab 06/30/24 0437  AST 19  ALT 17  ALKPHOS 120  BILITOT 0.3  PROT 6.5  ALBUMIN  2.1*   No results for input(s): LIPASE, AMYLASE in the last 168 hours. No results for input(s): AMMONIA in the last 168 hours. Coagulation Profile: No  results for input(s): INR, PROTIME in the last 168 hours. Cardiac Enzymes: No results for input(s): CKTOTAL, CKMB, CKMBINDEX, TROPONINI in the last 168 hours. BNP (last 3 results) No results for input(s): PROBNP in the last 8760 hours. HbA1C: No results for input(s): HGBA1C in the last 72 hours. CBG: Recent Labs  Lab 07/01/24 1157 07/01/24 1721 07/01/24 2146 07/02/24 0832 07/02/24 1150  GLUCAP 249* 231* 185* 211* 222*   Lipid Profile: No results for input(s): CHOL, HDL, LDLCALC, TRIG, CHOLHDL, LDLDIRECT in the last 72 hours. Thyroid  Function Tests: No results for input(s): TSH, T4TOTAL, FREET4, T3FREE, THYROIDAB in the last 72 hours. Anemia Panel: No results for input(s): VITAMINB12, FOLATE, FERRITIN, TIBC, IRON, RETICCTPCT in the last 72 hours. Sepsis Labs: No  results for input(s): PROCALCITON, LATICACIDVEN in the last 168 hours.   Recent Results (from the past 240 hours)  Blood Cultures x 2 sites     Status: None   Collection Time: 06/23/24  2:58 PM   Specimen: BLOOD LEFT ARM  Result Value Ref Range Status   Specimen Description   Final    BLOOD LEFT ARM Performed at [facility], [address] [place], [place] [address]    Special Requests   Final    BOTTLES DRAWN AEROBIC AND ANAEROBIC Blood Culture adequate volume Performed at [facility], [address], [place], [place] [address]    Culture   Final    NO GROWTH 5 DAYS Performed at [facility], [address], [place], [place] [address]    Report Status 06/28/2024 FINAL  Final  Blood Cultures x 2 sites     Status: None   Collection Time: 06/23/24  2:58 PM   Specimen: BLOOD LEFT ARM  Result Value Ref Range Status   Specimen Description   Final    BLOOD LEFT ARM Performed at [facility], [address], [place], [place] [address]    Special Requests   Final    BOTTLES DRAWN AEROBIC AND ANAEROBIC Blood Culture results may not be optimal due to an inadequate volume of blood received in culture bottles Performed at [facility], [address], [place], [place] [address]    Culture   Final    NO GROWTH 5 DAYS Performed at [facility], [address] [place], [place] [address]    Report Status 06/28/2024 FINAL  Final   Radiology Studies: No results found.  Scheduled Meds:  ascorbic acid   500 mg Oral Daily   baclofen  20 mg Oral TID   busPIRone   10 mg Oral BID   clopidogrel   75 mg Oral Daily   enoxaparin  (LOVENOX ) injection  40 mg Subcutaneous Q24H   famotidine   20 mg Oral BID   feeding supplement (GLUCERNA SHAKE)  237 mL Oral TID BM   ferrous sulfate   325 mg Oral Q breakfast   insulin  aspart  0-15 Units Subcutaneous TID WC   insulin  aspart  0-5 Units Subcutaneous QHS    insulin  glargine  14 Units Subcutaneous Daily   levETIRAcetam   500 mg Oral BID   lipase/protease/amylase  12,000 Units Oral TID WC   melatonin  3 mg Oral QHS   midodrine   10 mg Oral TID WC   mirtazapine  7.5 mg Oral QHS   multivitamin with minerals  1 tablet Oral Daily   mupirocin  ointment  1 Application Topical Daily   rosuvastatin   5 mg Oral QHS   senna  2 tablet Oral BID   sertraline   100 mg Oral Daily   tamsulosin   0.4 mg Oral QHS   zinc sulfate (50mg  elemental zinc)  220 mg Oral Daily   Continuous Infusions:  ampicillin-sulbactam (UNASYN) IV 3 g (07/02/24 0916)   linezolid (ZYVOX) IV 600 mg (07/02/24 1026)     LOS: 9 days    Time spent: 35 mins    Darcel Dawley, MD [facility]   If 7PM-7AM, please contact night-coverage

## 2024-07-02 NOTE — Plan of Care (Signed)
 Call bell within reached. Health Education rendered. Bed alarm on Problem: Education: Goal: Ability to describe self-care measures that may prevent or decrease complications (Diabetes Survival Skills Education) will improve Outcome: Progressing Goal: Individualized Educational Video(s) Outcome: Progressing   Problem: Coping: Goal: Ability to adjust to condition or change in health will improve Outcome: Progressing   Problem: Fluid Volume: Goal: Ability to maintain a balanced intake and output will improve Outcome: Progressing   Problem: Health Behavior/Discharge Planning: Goal: Ability to identify and utilize available resources and services will improve Outcome: Progressing Goal: Ability to manage health-related needs will improve Outcome: Progressing   Problem: Metabolic: Goal: Ability to maintain appropriate glucose levels will improve Outcome: Progressing   Problem: Nutritional: Goal: Maintenance of adequate nutrition will improve Outcome: Progressing Goal: Progress toward achieving an optimal weight will improve Outcome: Progressing   Problem: Skin Integrity: Goal: Risk for impaired skin integrity will decrease Outcome: Progressing   Problem: Tissue Perfusion: Goal: Adequacy of tissue perfusion will improve Outcome: Progressing   Problem: Education: Goal: Knowledge of General Education information will improve Description: Including pain rating scale, medication(s)/side effects and non-pharmacologic comfort measures Outcome: Progressing   Problem: Health Behavior/Discharge Planning: Goal: Ability to manage health-related needs will improve Outcome: Progressing   Problem: Clinical Measurements: Goal: Ability to maintain clinical measurements within normal limits will improve Outcome: Progressing Goal: Will remain free from infection Outcome: Progressing Goal: Diagnostic test results will improve Outcome: Progressing Goal: Respiratory complications will  improve Outcome: Progressing Goal: Cardiovascular complication will be avoided Outcome: Progressing   Problem: Activity: Goal: Risk for activity intolerance will decrease Outcome: Progressing   Problem: Nutrition: Goal: Adequate nutrition will be maintained Outcome: Progressing   Problem: Coping: Goal: Level of anxiety will decrease Outcome: Progressing   Problem: Elimination: Goal: Will not experience complications related to bowel motility Outcome: Progressing Goal: Will not experience complications related to urinary retention Outcome: Progressing   Problem: Pain Managment: Goal: General experience of comfort will improve and/or be controlled Outcome: Progressing   Problem: Safety: Goal: Ability to remain free from injury will improve Outcome: Progressing   Problem: Skin Integrity: Goal: Risk for impaired skin integrity will decrease Outcome: Progressing

## 2024-07-02 NOTE — Progress Notes (Signed)
 Daily Progress Note   Patient Name: Brandon [name]       Date: 07/02/2024 DOB: 01-07-1966  Age: 58 y.o. MRN#: [id number] Attending Physician: Leotis Bogus, MD Primary Care Physician: Donal Channing SQUIBB, FNP Admit Date: 06/23/2024  Reason for Consultation/Follow-up: Establishing goals of care  Length of Stay: 9  Current Medications: Scheduled Meds:   amoxicillin-clavulanate  1 tablet Oral Q12H   ascorbic acid   500 mg Oral Daily   baclofen  20 mg Oral TID   busPIRone   10 mg Oral BID   clopidogrel   75 mg Oral Daily   doxycycline  100 mg Oral Q12H   enoxaparin  (LOVENOX ) injection  40 mg Subcutaneous Q24H   famotidine   20 mg Oral BID   feeding supplement (GLUCERNA SHAKE)  237 mL Oral TID BM   ferrous sulfate   325 mg Oral Q breakfast   insulin  aspart  0-15 Units Subcutaneous TID WC   insulin  aspart  0-5 Units Subcutaneous QHS   insulin  glargine  14 Units Subcutaneous Daily   levETIRAcetam   500 mg Oral BID   lipase/protease/amylase  12,000 Units Oral TID WC   melatonin  3 mg Oral QHS   midodrine   10 mg Oral TID WC   mirtazapine  7.5 mg Oral QHS   multivitamin with minerals  1 tablet Oral Daily   mupirocin  ointment  1 Application Topical Daily   rosuvastatin   5 mg Oral QHS   senna  2 tablet Oral BID   sertraline   100 mg Oral Daily   tamsulosin   0.4 mg Oral QHS   zinc sulfate (50mg  elemental zinc)  220 mg Oral Daily    Continuous Infusions:   PRN Meds: acetaminophen  **OR** acetaminophen , bisacodyl , HYDROmorphone  (DILAUDID ) injection, ondansetron  **OR** ondansetron  (ZOFRAN ) IV, oxyCODONE , senna-docusate  Physical Exam Constitutional:      General: He is not in acute distress.    Appearance: He is cachectic. He is ill-appearing.     Comments: All extremities contractured   HENT:     Head: Normocephalic and atraumatic.  Neurological:     Mental Status: He is alert.             Vital Signs: BP (!) 88/62 (BP Location: Right Arm)   Pulse 73   Temp 97.9 F (36.6 C) (Oral)   Resp 18  Ht 6' (1.829 m)   Wt 63 kg   SpO2 94%   BMI 18.84 kg/m  SpO2: SpO2: 94 % O2 Device: O2 Device: Room Air O2 Flow Rate:          Palliative Assessment/Data: 30%      Patient Active Problem List   Diagnosis Date Noted   Diabetic foot ulcer (HCC) 06/23/2024   Decreased pedal pulses 06/23/2024   H/O supraventricular tachycardia 03/28/2024   Acute cystitis 03/28/2024   Hematuria 03/28/2024   History of seizure 03/28/2024   Chronic hypotension 03/28/2024   Pancreatic insufficiency 03/28/2024   Generalized anxiety disorder 03/28/2024   Acute cystitis with hematuria 03/28/2024   Flank pain 01/08/2024   Pneumococcal bacteremia 01/08/2024   Pneumaturia 01/08/2024   Sepsis (HCC) 01/07/2024   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   SVT (supraventricular tachycardia) 01/07/2024   Infection due to ESBL-producing Klebsiella pneumoniae 01/07/2024   Gram-negative bacteremia 01/07/2024   Hyperglycemia 01/07/2024   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   Abnormal findings on diagnostic imaging of spine 01/06/2024   Acute pyelonephritis 01/06/2024   Fall 08/01/2023   ICH (intracerebral hemorrhage) (HCC) 07/30/2023   Pain in left shoulder 03/25/2022   Pressure injury of skin 01/08/2022   Frequent falls 01/08/2022   DKA (diabetic ketoacidosis) (HCC) 01/07/2022   Rhabdomyolysis 01/07/2022   Protein-calorie malnutrition, severe 12/23/2020   Hyperosmolar hyperglycemic state (HHS) (HCC) 12/22/2020   Uncontrolled type 2 diabetes mellitus with hyperglycemia, with long-term current use of insulin  (HCC) 12/21/2020   Hyperglycemia due to diabetes mellitus (HCC) 12/21/2020   Empyema lung (HCC)    Epidural abscess    Diskitis 02/06/2020   Alcohol  withdrawal delirium (HCC) 02/06/2020    Epilepsy (HCC) 02/06/2020   Pleural effusion on right 02/06/2020   History of CVA (cerebrovascular accident) 01/23/2017   Dry eyes    Sleep disturbance    Essential hypertension, benign    Upper GI bleed    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   Fatty liver    Tobacco abuse    Insulin  dependent type 2 diabetes mellitus (HCC)    History of CVA with residual deficit    Acute lower UTI    Wide-complex tachycardia    Chronic alcoholic pancreatitis (HCC)    Hyponatremia 03/09/2016   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   Acute blood loss anemia 03/09/2016   Gastric varices    Alcohol  abuse    Left-sided neglect    Normocytic anemia    UGIB (upper gastrointestinal bleed)    Pressure ulcer 03/06/2016   Acute encephalopathy    Cerebral infarction (HCC) 03/05/2016   Carotid stenosis 04/06/2015   CAD in native artery 03/16/2015   Unstable angina pectoris (HCC) 03/15/2015   Neck pain 10/20/2013   Insomnia 12/29/2012   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 02/25/2011   Hepatic steatosis 02/25/2011    Palliative Care Assessment & Plan   Patient Profile: 58 y.o. male  with past medical history of CVA, left  hemiparesis, wheelchair and bedbound, 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 in Apr 2025 treated medically given baseline functional status, anxiety, seizures, DM, CAD  last PCI >5 years, HTN, chronic hypotension on midodrine , hx chronic pancreatitis  admitted on 06/23/2024 with worsening left foot wound. Imaging confirms left fifth metatarsal osteomyelitis.   Today's Discussion: Reviewed patient chart and received update from attending provider.  Attending team has recommended transitioning patient to hospice. Patient's sister who is decision maker has been difficult to reach to continue goals of care discussions.  Patient lying in bed with television on in NAD. No family at bedside. He reports no pain. He answers yes/no questions and tells me his sister's name. Although he can answer simple questions he is not able to have meaningful discussion around goals of care.   Left voicemail for patient's sister Brandon [name] with call back information. PMT will continue to follow peripherally and continue goals of care discussions when patient's proxy decision maker/sister is available.  Recommendations/Plan: Continue full code/full scope GOC discussion when able to speak to patient's sister Brandon [name]- left vmail with call back number PMT will continue to follow    Code Status:    Code Status Orders  (From admission, onward)           Start     Ordered   06/23/24 1848  Full code  Continuous       Question:  By:  Answer:  Consent: discussion documented in EHR   06/23/24 1848         Extensive chart review has been completed prior to seeing the patient including labs, vital signs, imaging, progress/consult notes, orders, medications, and available advance directive documents.  Care plan was discussed with Dr. Leotis  Time spent: 35 minutes  Thank you for allowing the Palliative Medicine Team to assist in the care of this patient.    Stephane CHRISTELLA Palin, NP  Please contact Palliative Medicine Team phone at [phone] for questions and concerns.

## 2024-07-03 DIAGNOSIS — L97422 Non-pressure chronic ulcer of left heel and midfoot with fat layer exposed: Secondary | ICD-10-CM | POA: Diagnosis not present

## 2024-07-03 DIAGNOSIS — E11621 Type 2 diabetes mellitus with foot ulcer: Secondary | ICD-10-CM | POA: Diagnosis not present

## 2024-07-03 LAB — GLUCOSE, CAPILLARY
Glucose-Capillary: 154 mg/dL — ABNORMAL HIGH (ref 70–99)
Glucose-Capillary: 116 mg/dL — ABNORMAL HIGH (ref 70–99)
Glucose-Capillary: 274 mg/dL — ABNORMAL HIGH (ref 70–99)
Glucose-Capillary: 234 mg/dL — ABNORMAL HIGH (ref 70–99)

## 2024-07-03 MED ORDER — SODIUM CHLORIDE 0.9 % IV BOLUS
500.0000 mL | Freq: Once | INTRAVENOUS | Status: AC
  Administered 2024-07-03: 500 mL via INTRAVENOUS

## 2024-07-03 MED ORDER — LACTATED RINGERS IV BOLUS: 250.0000 mL | Freq: Once | INTRAVENOUS | Status: AC

## 2024-07-03 NOTE — Plan of Care (Signed)

## 2024-07-03 NOTE — Plan of Care (Signed)
 Report received from night nurse abt BP, had bolus. Rechecked BP after bolus @ 1005 76/62, rechecked 61/43. MD notified, rechecked using manual BP was 86/63. Orders made and carried out. MEWS protocol initiated. Repositioned to comfort. Call bell within reached. Bed alarm on. Problem: Education: Goal: Ability to describe self-care measures that may prevent or decrease complications (Diabetes Survival Skills Education) will improve Outcome: Progressing Goal: Individualized Educational Video(s) Outcome: Progressing   Problem: Coping: Goal: Ability to adjust to condition or change in health will improve Outcome: Progressing   Problem: Fluid Volume: Goal: Ability to maintain a balanced intake and output will improve Outcome: Progressing   Problem: Health Behavior/Discharge Planning: Goal: Ability to identify and utilize available resources and services will improve Outcome: Progressing Goal: Ability to manage health-related needs will improve Outcome: Progressing   Problem: Metabolic: Goal: Ability to maintain appropriate glucose levels will improve Outcome: Progressing   Problem: Nutritional: Goal: Maintenance of adequate nutrition will improve Outcome: Progressing Goal: Progress toward achieving an optimal weight will improve Outcome: Progressing   Problem: Skin Integrity: Goal: Risk for impaired skin integrity will decrease Outcome: Progressing   Problem: Tissue Perfusion: Goal: Adequacy of tissue perfusion will improve Outcome: Progressing   Problem: Education: Goal: Knowledge of General Education information will improve Description: Including pain rating scale, medication(s)/side effects and non-pharmacologic comfort measures Outcome: Progressing   Problem: Health Behavior/Discharge Planning: Goal: Ability to manage health-related needs will improve Outcome: Progressing   Problem: Clinical Measurements: Goal: Ability to maintain clinical measurements within normal  limits will improve Outcome: Progressing Goal: Will remain free from infection Outcome: Progressing Goal: Diagnostic test results will improve Outcome: Progressing Goal: Respiratory complications will improve Outcome: Progressing Goal: Cardiovascular complication will be avoided Outcome: Progressing   Problem: Activity: Goal: Risk for activity intolerance will decrease Outcome: Progressing   Problem: Nutrition: Goal: Adequate nutrition will be maintained Outcome: Progressing   Problem: Coping: Goal: Level of anxiety will decrease Outcome: Progressing   Problem: Elimination: Goal: Will not experience complications related to bowel motility Outcome: Progressing Goal: Will not experience complications related to urinary retention Outcome: Progressing   Problem: Pain Managment: Goal: General experience of comfort will improve and/or be controlled Outcome: Progressing   Problem: Safety: Goal: Ability to remain free from injury will improve Outcome: Progressing   Problem: Skin Integrity: Goal: Risk for impaired skin integrity will decrease Outcome: Progressing

## 2024-07-03 NOTE — Progress Notes (Signed)
 PROGRESS NOTE    Brandon [name]  FMW:[id number] DOB: November 06, 1965 DOA: 06/23/2024 PCP: Donal Channing SQUIBB, FNP  Brief Narrative:  This 58 yrs old Male with hx CVA, left hemiparesis, wheelchair and bedbound, 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 in Apr 2025 treated medically given baseline functional status, anxiety, seizures, DM, CAD last PCI >5 years, HTN, chronic hypotension on midodrine , hx chronic pancreatitis who was sent from [facility] for worsening foot wound. In the hospital, MRI foot confirmed osteomyelitis,  vascular consulted.  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 The extent of infection is not curable medically.   Vascular surgery consulted, ABI showed bilateral lower extremity vascular disease, vascular surgery recommended above-knee amputation. However, given his overall prognosis, the medical team strongly recommends against this. Hospice discussions are in process. - Continue Unasyn,and linezolid for now. - Consulted palliative care.   Dementia: Severe protein calorie malnutrition: Failure to thrive Cerebrovascular disease with left hemiplegia: History of recent epidural abscess April 2025 with cauda equina syndrome, now bilateral lower extremity contractures: -The patient was living at home until 2023. Early that year, he was already described as severely malnourished in the setting of alcoholism, diabetes, chronic pancreatitis, and requiring assistance with ambulating.  -A year later, he had moved in to LTC, was admitted from SNF with fall and IPH, had numerous pressure ulcers at that time.   -From that point on, he appears to have not been able to walk. -In April of this year, he was admitted for  emphysematous pyelonephritis, S pneumo/ESBL Klebsiella bacteremia and large inoperable epidural abscess.  This was treated medically, but by his hospital follow ups late spring, he was unable to turn himself on the gurney and showing crippling contractures in his arms and legs. -With regard to dementia, he has had multiple strokes, subsequent intraparenchymal hemorrhage.  In addition, MRI scans for the last several years have shown age-advanced cerebral atrophy, possibly alcohol  related, or premature dementia.     In totality, with his dementia, malnutrition, paralysis, and contractures, and now osteomyelitis, he appears to have terminal disease, I recommend transition to hospice and have made this recommendation to POA/sister, who is considering. -Continue baclofen, Plavix , Crestor , feeding supplements, vitamin C , MVI, mirtazapine, Buspar  and sertraline  for now    Peripheral vascular disease: Long standing vascular disease.  ABI on left with moderate to severe disease. - Continue Plavix , Crestor .   Anemia: Due to chronic disease. -Trend hemoglobin, Hb relatively stable.   Coronary artery disease: -Continue Plavix , Crestor .   Diabetes Mellitus II Continue Lantus  14 units daily Continue sliding scale.   Chronic hypotension: -Continue midodrine .   Seizure disorder: No seizures observed. - Continue Keppra    Chronic pancreatitis: -Continue Creon    Decision making capacity: The patient does not demonstrate ordered, logical thinking, He is not able to articulate his medical problem, alternative treatment plans, or foreseeable consequences, due to dementia.  His sister has been identified by his facility as his next of kin and surrogate decision maker, no other POA is known.        DVT prophylaxis: Lovenox  Code Status: Full code Family Communication: No family at bed side. Disposition Plan:    Status is: Inpatient Remains inpatient appropriate because: Severity of  illness. Palliative care consulted.  Called multiple times,  left messages for the sister to call back to make decisions about goals of care.    Consultants:  Vascular surgery Palliative care  Procedures:   Antimicrobials:  Anti-infectives (From admission, onward)    Start     Dose/Rate Route Frequency Ordered Stop   07/02/24 2200  amoxicillin-clavulanate (AUGMENTIN) 875-125 MG per tablet 1 tablet        1 tablet Oral Every 12 hours 07/02/24 1400     07/02/24 2200  doxycycline (VIBRA-TABS) tablet 100 mg        100 mg Oral Every 12 hours 07/02/24 1400     06/23/24 2200  Ampicillin-Sulbactam (UNASYN) 3 g in sodium chloride  0.9 % 100 mL IVPB  Status:  Discontinued        3 g 200 mL/hr over 30 Minutes Intravenous Every 6 hours 06/23/24 1935 07/02/24 1400   06/23/24 1934  linezolid (ZYVOX) IVPB 600 mg  Status:  Discontinued        600 mg 300 mL/hr over 60 Minutes Intravenous Every 12 hours 06/23/24 1935 07/02/24 1400   06/23/24 1330  Ampicillin-Sulbactam (UNASYN) 3 g in sodium chloride  0.9 % 100 mL IVPB       Placed in And Linked Group   3 g 200 mL/hr over 30 Minutes Intravenous  Once 06/23/24 1321 06/23/24 1936   06/23/24 1330  linezolid (ZYVOX) tablet 600 mg       Placed in And Linked Group   600 mg Oral  Once 06/23/24 1321 06/23/24 1547       Subjective: Patient is seen and examined at bedside. Overnight events noted. Patient lying in the bed , noted  contractures.   He was lying on his left side.  He is alert and oriented x 2, following commands.  Objective: Vitals:   07/03/24 0600 07/03/24 1005 07/03/24 1055 07/03/24 1113  BP: (!) 78/60 (!) 76/62 (!) 61/43 (!) 86/63  Pulse: 90 92    Resp: 20 15    Temp: 98.2 F (36.8 C) 98.5 F (36.9 C)    TempSrc: Oral Oral    SpO2: 94% 99%    Weight:      Height:        Intake/Output Summary (Last 24 hours) at 07/03/2024 1128 Last data filed at 07/02/2024 1300 Gross per 24 hour  Intake 300 ml  Output --  Net 300 ml    Filed Weights   06/27/24 0535 06/30/24 0500 07/01/24 0500  Weight: 62.3 kg 66 kg 63 kg    Examination:  General exam: Malnourished, dishelved , cachectic, deconditioned, frail, with contractures. Respiratory system: CTA Bilaterally. Respiratory effort normal. RR 16 Cardiovascular system: S1 & S2 heard, RRR. No JVD, murmurs, rubs, gallops or clicks.  Gastrointestinal system: Abdomen is non distended, soft and nontender. Normal bowel sounds heard. Central nervous system: Alert , oriented x 2.  no focal neurological deficits. Extremities: No edema, no cyanosis.  Visible contractures Skin: No rashes, lesions or ulcers Psychiatry: not assessed.  Data Reviewed: I have personally reviewed following labs and imaging studies  CBC: Recent Labs  Lab 06/30/24 0437  WBC 7.5  HGB 8.3*  HCT 26.2*  MCV 90.0  PLT 416*   Basic Metabolic Panel: Recent Labs  Lab 06/30/24 0437  NA 134*  K 4.3  CL 99  CO2 23  GLUCOSE 230*  BUN 12  CREATININE 0.51*  CALCIUM  8.6*   GFR: Estimated Creatinine Clearance: 87.5 mL/min (A) (by C-G formula based on SCr of  0.51 mg/dL (L)). Liver Function Tests: Recent Labs  Lab 06/30/24 0437  AST 19  ALT 17  ALKPHOS 120  BILITOT 0.3  PROT 6.5  ALBUMIN  2.1*   No results for input(s): LIPASE, AMYLASE in the last 168 hours. No results for input(s): AMMONIA in the last 168 hours. Coagulation Profile: No results for input(s): INR, PROTIME in the last 168 hours. Cardiac Enzymes: No results for input(s): CKTOTAL, CKMB, CKMBINDEX, TROPONINI in the last 168 hours. BNP (last 3 results) No results for input(s): PROBNP in the last 8760 hours. HbA1C: No results for input(s): HGBA1C in the last 72 hours. CBG: Recent Labs  Lab 07/02/24 0832 07/02/24 1150 07/02/24 1628 07/02/24 2148 07/03/24 0737  GLUCAP 211* 222* 243* 235* 154*   Lipid Profile: No results for input(s): CHOL, HDL, LDLCALC, TRIG, CHOLHDL, LDLDIRECT in  the last 72 hours. Thyroid  Function Tests: No results for input(s): TSH, T4TOTAL, FREET4, T3FREE, THYROIDAB in the last 72 hours. Anemia Panel: No results for input(s): VITAMINB12, FOLATE, FERRITIN, TIBC, IRON, RETICCTPCT in the last 72 hours. Sepsis Labs: No results for input(s): PROCALCITON, LATICACIDVEN in the last 168 hours.   Recent Results (from the past 240 hours)  Blood Cultures x 2 sites     Status: None   Collection Time: 06/23/24  2:58 PM   Specimen: BLOOD LEFT ARM  Result Value Ref Range Status   Specimen Description   Final    BLOOD LEFT ARM Performed at [facility], [address], [place], [place] [address]    Special Requests   Final    BOTTLES DRAWN AEROBIC AND ANAEROBIC Blood Culture adequate volume Performed at [facility], [address] [place], [place] [address]    Culture   Final    NO GROWTH 5 DAYS Performed at [facility], [address], [place], [place] [address]    Report Status 06/28/2024 FINAL  Final  Blood Cultures x 2 sites     Status: None   Collection Time: 06/23/24  2:58 PM   Specimen: BLOOD LEFT ARM  Result Value Ref Range Status   Specimen Description   Final    BLOOD LEFT ARM Performed at [facility], [address], [place], [place] [address]    Special Requests   Final    BOTTLES DRAWN AEROBIC AND ANAEROBIC Blood Culture results may not be optimal due to an inadequate volume of blood received in culture bottles Performed at [facility], [address] [place], [place] [address]    Culture   Final    NO GROWTH 5 DAYS Performed at [facility], [address], [place], [place] [address]    Report Status 06/28/2024 FINAL  Final   Radiology Studies: No results found.  Scheduled Meds:  amoxicillin-clavulanate  1 tablet Oral Q12H   ascorbic acid   500 mg Oral Daily   baclofen  20 mg Oral TID   busPIRone   10 mg  Oral BID   clopidogrel   75 mg Oral Daily   doxycycline  100 mg Oral Q12H   enoxaparin  (LOVENOX ) injection  40 mg Subcutaneous Q24H   famotidine   20 mg Oral BID   feeding supplement (GLUCERNA SHAKE)  237 mL Oral TID BM   ferrous sulfate   325 mg Oral Q breakfast   insulin  aspart  0-15 Units Subcutaneous TID WC   insulin  aspart  0-5 Units Subcutaneous QHS   insulin  glargine  14 Units Subcutaneous Daily  levETIRAcetam   500 mg Oral BID   lipase/protease/amylase  12,000 Units Oral TID WC   melatonin  3 mg Oral QHS   midodrine   10 mg Oral TID WC   mirtazapine  7.5 mg Oral QHS   multivitamin with minerals  1 tablet Oral Daily   mupirocin  ointment  1 Application Topical Daily   rosuvastatin   5 mg Oral QHS   senna  2 tablet Oral BID   sertraline   100 mg Oral Daily   tamsulosin   0.4 mg Oral QHS   zinc sulfate (50mg  elemental zinc)  220 mg Oral Daily   Continuous Infusions:     LOS: 10 days    Time spent: 35 mins    Darcel Dawley, MD [facility]   If 7PM-7AM, please contact night-coverage

## 2024-07-03 NOTE — Progress Notes (Signed)
 Daily Progress Note   Patient Name: Brandon [name]       Date: 07/03/2024 DOB: Dec 06, 1965  Age: 58 y.o. MRN#: [id number] Attending Physician: Leotis Bogus, MD Primary Care Physician: Donal Channing SQUIBB, FNP Admit Date: 06/23/2024  Reason for Consultation/Follow-up: Establishing goals of care  Length of Stay: 10  Current Medications: Scheduled Meds:   amoxicillin-clavulanate  1 tablet Oral Q12H   ascorbic acid   500 mg Oral Daily   baclofen  20 mg Oral TID   busPIRone   10 mg Oral BID   clopidogrel   75 mg Oral Daily   doxycycline  100 mg Oral Q12H   enoxaparin  (LOVENOX ) injection  40 mg Subcutaneous Q24H   famotidine   20 mg Oral BID   feeding supplement (GLUCERNA SHAKE)  237 mL Oral TID BM   ferrous sulfate   325 mg Oral Q breakfast   insulin  aspart  0-15 Units Subcutaneous TID WC   insulin  aspart  0-5 Units Subcutaneous QHS   insulin  glargine  14 Units Subcutaneous Daily   levETIRAcetam   500 mg Oral BID   lipase/protease/amylase  12,000 Units Oral TID WC   melatonin  3 mg Oral QHS   midodrine   10 mg Oral TID WC   mirtazapine  7.5 mg Oral QHS   multivitamin with minerals  1 tablet Oral Daily   mupirocin  ointment  1 Application Topical Daily   rosuvastatin   5 mg Oral QHS   senna  2 tablet Oral BID   sertraline   100 mg Oral Daily   tamsulosin   0.4 mg Oral QHS   zinc sulfate (50mg  elemental zinc)  220 mg Oral Daily    Continuous Infusions:   PRN Meds: acetaminophen  **OR** acetaminophen , bisacodyl , HYDROmorphone  (DILAUDID ) injection, ondansetron  **OR** ondansetron  (ZOFRAN ) IV, oxyCODONE , senna-docusate  Physical Exam Vitals reviewed.  Constitutional:      General: He is sleeping. He is not in acute distress.    Appearance: He is cachectic. He is ill-appearing.      Comments: All extremities contractured  HENT:     Head: Normocephalic and atraumatic.  Cardiovascular:     Rate and Rhythm: Normal rate.  Pulmonary:     Effort: Pulmonary effort is normal.             Vital Signs: BP (!) 78/60 (BP Location: Left Arm)  Pulse 90   Temp 98.2 F (36.8 C) (Oral)   Resp 20   Ht 6' (1.829 m)   Wt 63 kg   SpO2 94%   BMI 18.84 kg/m  SpO2: SpO2: 94 % O2 Device: O2 Device: Room Air O2 Flow Rate:          Palliative Assessment/Data: 30%      Patient Active Problem List   Diagnosis Date Noted   Diabetic foot ulcer (HCC) 06/23/2024   Decreased pedal pulses 06/23/2024   H/O supraventricular tachycardia 03/28/2024   Acute cystitis 03/28/2024   Hematuria 03/28/2024   History of seizure 03/28/2024   Chronic hypotension 03/28/2024   Pancreatic insufficiency 03/28/2024   Generalized anxiety disorder 03/28/2024   Acute cystitis with hematuria 03/28/2024   Flank pain 01/08/2024   Pneumococcal bacteremia 01/08/2024   Pneumaturia 01/08/2024   Sepsis (HCC) 01/07/2024   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   SVT (supraventricular tachycardia) 01/07/2024   Infection due to ESBL-producing Klebsiella pneumoniae 01/07/2024   Gram-negative bacteremia 01/07/2024   Hyperglycemia 01/07/2024   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   Abnormal findings on diagnostic imaging of spine 01/06/2024   Acute pyelonephritis 01/06/2024   Fall 08/01/2023   ICH (intracerebral hemorrhage) (HCC) 07/30/2023   Pain in left shoulder 03/25/2022   Pressure injury of skin 01/08/2022   Frequent falls 01/08/2022   DKA (diabetic ketoacidosis) (HCC) 01/07/2022   Rhabdomyolysis 01/07/2022   Protein-calorie malnutrition, severe 12/23/2020   Hyperosmolar hyperglycemic state (HHS) (HCC) 12/22/2020   Uncontrolled type 2 diabetes mellitus with hyperglycemia, with long-term current use of insulin  (HCC) 12/21/2020   Hyperglycemia due to diabetes mellitus (HCC) 12/21/2020   Empyema lung (HCC)     Epidural abscess    Diskitis 02/06/2020   Alcohol  withdrawal delirium (HCC) 02/06/2020   Epilepsy (HCC) 02/06/2020   Pleural effusion on right 02/06/2020   History of CVA (cerebrovascular accident) 01/23/2017   Dry eyes    Sleep disturbance    Essential hypertension, benign    Upper GI bleed    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   Fatty liver    Tobacco abuse    Insulin  dependent type 2 diabetes mellitus (HCC)    History of CVA with residual deficit    Acute lower UTI    Wide-complex tachycardia    Chronic alcoholic pancreatitis (HCC)    Hyponatremia 03/09/2016   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   Acute blood loss anemia 03/09/2016   Gastric varices    Alcohol  abuse    Left-sided neglect    Normocytic anemia    UGIB (upper gastrointestinal bleed)    Pressure ulcer 03/06/2016   Acute encephalopathy    Cerebral infarction (HCC) 03/05/2016   Carotid stenosis 04/06/2015   CAD in native artery 03/16/2015   Unstable angina pectoris (HCC) 03/15/2015   Neck pain 10/20/2013   Insomnia 12/29/2012   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 02/25/2011   Hepatic steatosis 02/25/2011    Palliative Care Assessment &  Plan   Patient Profile: 58 y.o. male  with past medical history of CVA, left hemiparesis, wheelchair and bedbound, S pneumo/ESBL Klebsiella epidural abscess with cauda equina  syndrome in Apr 2025 treated medically given baseline functional status, anxiety, seizures, DM, CAD last PCI >5 years, HTN, chronic hypotension on midodrine , hx chronic pancreatitis  admitted on 06/23/2024 with worsening left foot wound. Imaging confirms left fifth metatarsal osteomyelitis.   Today's Discussion: Reviewed patient chart. RN notified patient's sister last night that PMT was trying to reach her to discuss goals of care.  Attending team continues to recommended transitioning patient to hospice. Patient lying in bed sleeping with television on in NAD. He does not easily awaken so I let him sleep. No family at bedside.  Left voicemail for patient's sister Brandon [name] with call back information. PMT will continue to follow peripherally and continue goals of care discussions when patient's proxy decision maker/sister is available.  Recommendations/Plan: Continue full code/full scope GOC discussion when able to speak to patient's sister Brandon [name]- left vmail 10/11 with call back number PMT will continue to follow    Code Status:    Code Status Orders  (From admission, onward)           Start     Ordered   06/23/24 1848  Full code  Continuous       Question:  By:  Answer:  Consent: discussion documented in EHR   06/23/24 1848         Extensive chart review has been completed prior to seeing the patient including labs, vital signs, imaging, progress/consult notes, orders, medications, and available advance directive documents.  Care plan was discussed with Dr. Leotis  Time spent: 25 minutes  Thank you for allowing the Palliative Medicine Team to assist in the care of this patient.    Stephane CHRISTELLA Palin, NP  Please contact Palliative Medicine Team phone at [phone] for questions and concerns.

## 2024-07-04 DIAGNOSIS — E11621 Type 2 diabetes mellitus with foot ulcer: Secondary | ICD-10-CM | POA: Diagnosis not present

## 2024-07-04 DIAGNOSIS — L97422 Non-pressure chronic ulcer of left heel and midfoot with fat layer exposed: Secondary | ICD-10-CM | POA: Diagnosis not present

## 2024-07-04 LAB — GLUCOSE, CAPILLARY
Glucose-Capillary: 119 mg/dL — ABNORMAL HIGH (ref 70–99)
Glucose-Capillary: 318 mg/dL — ABNORMAL HIGH (ref 70–99)
Glucose-Capillary: 72 mg/dL (ref 70–99)
Glucose-Capillary: 285 mg/dL — ABNORMAL HIGH (ref 70–99)

## 2024-07-04 NOTE — Plan of Care (Signed)
 Pt's VS stable. Gave PRN pain meds, repositioned to comfort. Needs attended. Seen at regular intervals. Had large BM today. Problem: Education: Goal: Ability to describe self-care measures that may prevent or decrease complications (Diabetes Survival Skills Education) will improve Outcome: Progressing Goal: Individualized Educational Video(s) Outcome: Progressing   Problem: Coping: Goal: Ability to adjust to condition or change in health will improve Outcome: Progressing   Problem: Fluid Volume: Goal: Ability to maintain a balanced intake and output will improve Outcome: Progressing   Problem: Health Behavior/Discharge Planning: Goal: Ability to identify and utilize available resources and services will improve Outcome: Progressing Goal: Ability to manage health-related needs will improve Outcome: Progressing   Problem: Metabolic: Goal: Ability to maintain appropriate glucose levels will improve Outcome: Progressing   Problem: Nutritional: Goal: Maintenance of adequate nutrition will improve Outcome: Progressing Goal: Progress toward achieving an optimal weight will improve Outcome: Progressing   Problem: Skin Integrity: Goal: Risk for impaired skin integrity will decrease Outcome: Progressing   Problem: Tissue Perfusion: Goal: Adequacy of tissue perfusion will improve Outcome: Progressing   Problem: Education: Goal: Knowledge of General Education information will improve Description: Including pain rating scale, medication(s)/side effects and non-pharmacologic comfort measures Outcome: Progressing   Problem: Health Behavior/Discharge Planning: Goal: Ability to manage health-related needs will improve Outcome: Progressing   Problem: Clinical Measurements: Goal: Ability to maintain clinical measurements within normal limits will improve Outcome: Progressing Goal: Will remain free from infection Outcome: Progressing Goal: Diagnostic test results will  improve Outcome: Progressing Goal: Respiratory complications will improve Outcome: Progressing Goal: Cardiovascular complication will be avoided Outcome: Progressing   Problem: Activity: Goal: Risk for activity intolerance will decrease Outcome: Progressing   Problem: Nutrition: Goal: Adequate nutrition will be maintained Outcome: Progressing   Problem: Coping: Goal: Level of anxiety will decrease Outcome: Progressing   Problem: Elimination: Goal: Will not experience complications related to bowel motility Outcome: Progressing Goal: Will not experience complications related to urinary retention Outcome: Progressing   Problem: Pain Managment: Goal: General experience of comfort will improve and/or be controlled Outcome: Progressing   Problem: Safety: Goal: Ability to remain free from injury will improve Outcome: Progressing   Problem: Skin Integrity: Goal: Risk for impaired skin integrity will decrease Outcome: Progressing

## 2024-07-04 NOTE — Progress Notes (Signed)
 PROGRESS NOTE    Brandon [name]  FMW:[id number] DOB: Nov 02, 1965 DOA: 06/23/2024 PCP: Donal Channing SQUIBB, FNP  Brief Narrative:  This 58 yrs old Male with hx CVA, left hemiparesis, wheelchair and bedbound, 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 in Apr 2025 treated medically given baseline functional status, anxiety, seizures, DM, CAD last PCI >5 years, HTN, chronic hypotension on midodrine , hx chronic pancreatitis who was sent from [facility] for worsening foot wound. In the hospital, MRI foot confirmed osteomyelitis,  vascular consulted.  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 The extent of infection is not curable medically.   Vascular surgery consulted, ABI showed bilateral lower extremity vascular disease, vascular surgery recommended above-knee amputation. However, given his overall prognosis, the medical team strongly recommends against this. Hospice discussions are in process. - Continued on Unasyn,and linezolid.  Now switched to Augmentin and doxycycline. - Consulted palliative care.   Dementia: Severe protein calorie malnutrition: Failure to thrive Cerebrovascular disease with left hemiplegia: History of recent epidural abscess April 2025 with cauda equina syndrome, now bilateral lower extremity contractures: -The patient was living at home until 2023. Early that year, he was already described as severely malnourished in the setting of alcoholism, diabetes, chronic pancreatitis, and requiring assistance with ambulating.  -A year later, he had moved in to LTC, was admitted from SNF with fall and IPH, had numerous pressure ulcers at that time.   -From that point on, he appears to have not been able to walk. -In  April of this year, he was admitted for emphysematous pyelonephritis, S pneumo/ESBL Klebsiella bacteremia and large inoperable epidural abscess.  This was treated medically, but by his hospital follow ups late spring, he was unable to turn himself on the gurney and showing crippling contractures in his arms and legs. -With regard to dementia, he has had multiple strokes, subsequent intraparenchymal hemorrhage.  In addition, MRI scans for the last several years have shown age-advanced cerebral atrophy, possibly alcohol  related, or premature dementia.     In totality, with his dementia, malnutrition, paralysis, and contractures, and now osteomyelitis, he appears to have terminal disease, I recommend transition to hospice and have made this recommendation to POA/sister, who is considering. -Continue baclofen, Plavix , Crestor , feeding supplements, vitamin C , MVI, mirtazapine, Buspar  and sertraline  for now    Peripheral vascular disease: Long standing vascular disease.  ABI on left with moderate to severe disease. - Continue Plavix , Crestor .   Anemia: Due to chronic disease. -Trend hemoglobin, Hb relatively stable.   Coronary artery disease: -Continue Plavix , Crestor .   Diabetes Mellitus II: Continue Lantus  14 units daily Continue sliding scale.   Chronic hypotension: -Continue midodrine .   Seizure disorder: No seizures observed. - Continue Keppra .   Chronic pancreatitis: -Continue Creon .   Decision making capacity: The patient does not demonstrate ordered, logical thinking, He is not able to articulate his medical problem, alternative treatment plans, or foreseeable consequences, due to dementia.  His sister has been identified by his facility as his next of kin and surrogate decision maker, no other POA is known.        DVT prophylaxis: Lovenox  Code Status: Full code Family Communication: No family at bed side. Disposition Plan:    Status is: Inpatient Remains inpatient  appropriate because: Severity  of illness. Palliative care consulted.  Called multiple times,  left messages for the sister to call back to make decisions about goals of care.    Consultants:  Vascular surgery Palliative care  Procedures:   Antimicrobials:  Anti-infectives (From admission, onward)    Start     Dose/Rate Route Frequency Ordered Stop   07/02/24 2200  amoxicillin-clavulanate (AUGMENTIN) 875-125 MG per tablet 1 tablet        1 tablet Oral Every 12 hours 07/02/24 1400     07/02/24 2200  doxycycline (VIBRA-TABS) tablet 100 mg        100 mg Oral Every 12 hours 07/02/24 1400     06/23/24 2200  Ampicillin-Sulbactam (UNASYN) 3 g in sodium chloride  0.9 % 100 mL IVPB  Status:  Discontinued        3 g 200 mL/hr over 30 Minutes Intravenous Every 6 hours 06/23/24 1935 07/02/24 1400   06/23/24 1934  linezolid (ZYVOX) IVPB 600 mg  Status:  Discontinued        600 mg 300 mL/hr over 60 Minutes Intravenous Every 12 hours 06/23/24 1935 07/02/24 1400   06/23/24 1330  Ampicillin-Sulbactam (UNASYN) 3 g in sodium chloride  0.9 % 100 mL IVPB       Placed in And Linked Group   3 g 200 mL/hr over 30 Minutes Intravenous  Once 06/23/24 1321 06/23/24 1936   06/23/24 1330  linezolid (ZYVOX) tablet 600 mg       Placed in And Linked Group   600 mg Oral  Once 06/23/24 1321 06/23/24 1547       Subjective: Patient is seen and examined at bedside. Overnight events noted. Patient lying in the bed , noted  contractures.   He denies any concerns.  He is alert and oriented x 2, following commands.  Objective: Vitals:   07/04/24 0500 07/04/24 0629 07/04/24 0648 07/04/24 0809  BP:  (!) 81/70 93/71 94/69   Pulse:  88  74  Resp:    14  Temp:    98.2 F (36.8 C)  TempSrc:    Oral  SpO2:    99%  Weight: 63.1 kg     Height:        Intake/Output Summary (Last 24 hours) at 07/04/2024 1143 Last data filed at 07/04/2024 0900 Gross per 24 hour  Intake 1400 ml  Output 3100 ml  Net -1700 ml    Filed Weights   06/30/24 0500 07/01/24 0500 07/04/24 0500  Weight: 66 kg 63 kg 63.1 kg    Examination:  General exam: Malnourished, dishelved , cachectic, deconditioned, frail, with contractures. Respiratory system: CTA Bilaterally. Respiratory effort normal. RR 16 Cardiovascular system: S1 & S2 heard, RRR. No JVD, murmurs, rubs, gallops or clicks.  Gastrointestinal system: Abdomen is non distended, soft and nontender. Normal bowel sounds heard. Central nervous system: Alert , oriented x 2.  no focal neurological deficits. Extremities: No edema, no cyanosis.  Visible contractures Skin: No rashes, lesions or ulcers Psychiatry: not assessed.  Data Reviewed: I have personally reviewed following labs and imaging studies  CBC: Recent Labs  Lab 06/30/24 0437  WBC 7.5  HGB 8.3*  HCT 26.2*  MCV 90.0  PLT 416*   Basic Metabolic Panel: Recent Labs  Lab 06/30/24 0437  NA 134*  K 4.3  CL 99  CO2 23  GLUCOSE 230*  BUN 12  CREATININE 0.51*  CALCIUM  8.6*   GFR: Estimated Creatinine Clearance: 87.6 mL/min (A) (by C-G formula based on SCr of 0.51  mg/dL (L)). Liver Function Tests: Recent Labs  Lab 06/30/24 0437  AST 19  ALT 17  ALKPHOS 120  BILITOT 0.3  PROT 6.5  ALBUMIN  2.1*   No results for input(s): LIPASE, AMYLASE in the last 168 hours. No results for input(s): AMMONIA in the last 168 hours. Coagulation Profile: No results for input(s): INR, PROTIME in the last 168 hours. Cardiac Enzymes: No results for input(s): CKTOTAL, CKMB, CKMBINDEX, TROPONINI in the last 168 hours. BNP (last 3 results) No results for input(s): PROBNP in the last 8760 hours. HbA1C: No results for input(s): HGBA1C in the last 72 hours. CBG: Recent Labs  Lab 07/03/24 1145 07/03/24 1647 07/03/24 2055 07/04/24 0750 07/04/24 1139  GLUCAP 116* 274* 234* 119* 318*   Lipid Profile: No results for input(s): CHOL, HDL, LDLCALC, TRIG, CHOLHDL, LDLDIRECT in  the last 72 hours. Thyroid  Function Tests: No results for input(s): TSH, T4TOTAL, FREET4, T3FREE, THYROIDAB in the last 72 hours. Anemia Panel: No results for input(s): VITAMINB12, FOLATE, FERRITIN, TIBC, IRON, RETICCTPCT in the last 72 hours. Sepsis Labs: No results for input(s): PROCALCITON, LATICACIDVEN in the last 168 hours.   No results found for this or any previous visit (from the past 240 hours).  Radiology Studies: No results found.  Scheduled Meds:  amoxicillin-clavulanate  1 tablet Oral Q12H   ascorbic acid   500 mg Oral Daily   baclofen  20 mg Oral TID   busPIRone   10 mg Oral BID   clopidogrel   75 mg Oral Daily   doxycycline  100 mg Oral Q12H   enoxaparin  (LOVENOX ) injection  40 mg Subcutaneous Q24H   famotidine   20 mg Oral BID   feeding supplement (GLUCERNA SHAKE)  237 mL Oral TID BM   ferrous sulfate   325 mg Oral Q breakfast   insulin  aspart  0-15 Units Subcutaneous TID WC   insulin  aspart  0-5 Units Subcutaneous QHS   insulin  glargine  14 Units Subcutaneous Daily   levETIRAcetam   500 mg Oral BID   lipase/protease/amylase  12,000 Units Oral TID WC   melatonin  3 mg Oral QHS   midodrine   10 mg Oral TID WC   mirtazapine  7.5 mg Oral QHS   multivitamin with minerals  1 tablet Oral Daily   mupirocin  ointment  1 Application Topical Daily   rosuvastatin   5 mg Oral QHS   senna  2 tablet Oral BID   sertraline   100 mg Oral Daily   tamsulosin   0.4 mg Oral QHS   zinc sulfate (50mg  elemental zinc)  220 mg Oral Daily   Continuous Infusions:     LOS: 11 days    Time spent: 35 mins    Darcel Dawley, MD [facility]   If 7PM-7AM, please contact night-coverage

## 2024-07-05 DIAGNOSIS — E11621 Type 2 diabetes mellitus with foot ulcer: Secondary | ICD-10-CM | POA: Diagnosis not present

## 2024-07-05 DIAGNOSIS — L97422 Non-pressure chronic ulcer of left heel and midfoot with fat layer exposed: Secondary | ICD-10-CM | POA: Diagnosis not present

## 2024-07-05 LAB — GLUCOSE, CAPILLARY
Glucose-Capillary: 116 mg/dL — ABNORMAL HIGH (ref 70–99)
Glucose-Capillary: 366 mg/dL — ABNORMAL HIGH (ref 70–99)
Glucose-Capillary: 90 mg/dL (ref 70–99)
Glucose-Capillary: 197 mg/dL — ABNORMAL HIGH (ref 70–99)

## 2024-07-05 NOTE — Plan of Care (Signed)
  Problem: Coping: Goal: Ability to adjust to condition or change in health will improve Outcome: Progressing   Problem: Nutritional: Goal: Maintenance of adequate nutrition will improve Outcome: Progressing   Problem: Skin Integrity: Goal: Risk for impaired skin integrity will decrease Outcome: Not Progressing

## 2024-07-05 NOTE — Progress Notes (Addendum)
 Daily Progress Note   Patient Name: Brandon [name]       Date: 07/05/2024 DOB: 21-Apr-1966  Age: 58 y.o. MRN#: [id number] Attending Physician: Leotis Bogus, MD Primary Care Physician: Donal Channing SQUIBB, FNP Admit Date: 06/23/2024  Reason for Consultation/Follow-up: Establishing goals of care  Length of Stay: 12  Current Medications: Scheduled Meds:   amoxicillin-clavulanate  1 tablet Oral Q12H   ascorbic acid   500 mg Oral Daily   baclofen  20 mg Oral TID   busPIRone   10 mg Oral BID   clopidogrel   75 mg Oral Daily   doxycycline  100 mg Oral Q12H   enoxaparin  (LOVENOX ) injection  40 mg Subcutaneous Q24H   famotidine   20 mg Oral BID   feeding supplement (GLUCERNA SHAKE)  237 mL Oral TID BM   ferrous sulfate   325 mg Oral Q breakfast   insulin  aspart  0-15 Units Subcutaneous TID WC   insulin  aspart  0-5 Units Subcutaneous QHS   insulin  glargine  14 Units Subcutaneous Daily   levETIRAcetam   500 mg Oral BID   lipase/protease/amylase  12,000 Units Oral TID WC   melatonin  3 mg Oral QHS   midodrine   10 mg Oral TID WC   mirtazapine  7.5 mg Oral QHS   multivitamin with minerals  1 tablet Oral Daily   mupirocin  ointment  1 Application Topical Daily   rosuvastatin   5 mg Oral QHS   senna  2 tablet Oral BID   sertraline   100 mg Oral Daily   tamsulosin   0.4 mg Oral QHS   zinc sulfate (50mg  elemental zinc)  220 mg Oral Daily    Continuous Infusions:   PRN Meds: acetaminophen  **OR** acetaminophen , bisacodyl , HYDROmorphone  (DILAUDID ) injection, ondansetron  **OR** ondansetron  (ZOFRAN ) IV, oxyCODONE , senna-docusate  Physical Exam Vitals reviewed.  Constitutional:      General: He is sleeping. He is not in acute distress.    Appearance: He is cachectic. He is ill-appearing.      Comments: All extremities contractured  HENT:     Head: Normocephalic and atraumatic.  Cardiovascular:     Rate and Rhythm: Normal rate.  Pulmonary:     Effort: Pulmonary effort is normal.  Skin:    Comments: Multiple meliplex over body  Vital Signs: BP 91/64 (BP Location: Left Arm)   Pulse 70   Temp 98.6 F (37 C)   Resp 17   Ht 6' (1.829 m)   Wt 63 kg   SpO2 98%   BMI 18.84 kg/m  SpO2: SpO2: 98 % O2 Device: O2 Device: Room Air O2 Flow Rate:          Palliative Assessment/Data: 30%      Patient Active Problem List   Diagnosis Date Noted   Diabetic foot ulcer (HCC) 06/23/2024   Decreased pedal pulses 06/23/2024   H/O supraventricular tachycardia 03/28/2024   Acute cystitis 03/28/2024   Hematuria 03/28/2024   History of seizure 03/28/2024   Chronic hypotension 03/28/2024   Pancreatic insufficiency 03/28/2024   Generalized anxiety disorder 03/28/2024   Acute cystitis with hematuria 03/28/2024   Flank pain 01/08/2024   Pneumococcal bacteremia 01/08/2024   Pneumaturia 01/08/2024   Sepsis (HCC) 01/07/2024   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   SVT (supraventricular tachycardia) 01/07/2024   Infection due to ESBL-producing Klebsiella pneumoniae 01/07/2024   Gram-negative bacteremia 01/07/2024   Hyperglycemia 01/07/2024   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   Abnormal findings on diagnostic imaging of spine 01/06/2024   Acute pyelonephritis 01/06/2024   Fall 08/01/2023   ICH (intracerebral hemorrhage) (HCC) 07/30/2023   Pain in left shoulder 03/25/2022   Pressure injury of skin 01/08/2022   Frequent falls 01/08/2022   DKA (diabetic ketoacidosis) (HCC) 01/07/2022   Rhabdomyolysis 01/07/2022   Protein-calorie malnutrition, severe 12/23/2020   Hyperosmolar hyperglycemic state (HHS) (HCC) 12/22/2020   Uncontrolled type 2 diabetes mellitus with hyperglycemia, with long-term current use of insulin  (HCC) 12/21/2020   Hyperglycemia due to diabetes mellitus  (HCC) 12/21/2020   Empyema lung (HCC)    Epidural abscess    Diskitis 02/06/2020   Alcohol  withdrawal delirium (HCC) 02/06/2020   Epilepsy (HCC) 02/06/2020   Pleural effusion on right 02/06/2020   History of CVA (cerebrovascular accident) 01/23/2017   Dry eyes    Sleep disturbance    Essential hypertension, benign    Upper GI bleed    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   Fatty liver    Tobacco abuse    Insulin  dependent type 2 diabetes mellitus (HCC)    History of CVA with residual deficit    Acute lower UTI    Wide-complex tachycardia    Chronic alcoholic pancreatitis (HCC)    Hyponatremia 03/09/2016   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   Acute blood loss anemia 03/09/2016   Gastric varices    Alcohol  abuse    Left-sided neglect    Normocytic anemia    UGIB (upper gastrointestinal bleed)    Pressure ulcer 03/06/2016   Acute encephalopathy    Cerebral infarction (HCC) 03/05/2016   Carotid stenosis 04/06/2015   CAD in native artery 03/16/2015   Unstable angina pectoris (HCC) 03/15/2015   Neck pain 10/20/2013   Insomnia 12/29/2012   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 02/25/2011   Hepatic  steatosis 02/25/2011    Palliative Care Assessment & Plan   Patient Profile: 58 y.o. 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 Imaging confirms left fifth metatarsal osteomyelitis.   Today's Discussion: Reviewed patient chart. Patient lying in bed sleeping in a fetal position with television on in NAD. He does not easily awaken so I let him sleep. No family at bedside.  Spoke to patient's sister [name]. [name] shared her surprise that the patient's functional status and health have declined in such a just a couple years. We discussed the patient's history of FTT, CVA, and osteomyelitis. [name] understands the patient is weak and would likely do poorly with a surgery.   We discussed the difference between continuing an aggressive medical path and transitioning to a comfort focused path. We discussed possible locations for end of life care- including going back to [facility], going to a different SNF with hospice, or inpatient hospice. Luke is leaning towards comfort focused care. She believes he would likely not want to continue aggressive measures with his current quality of life. She struggles with this decision and we agree to follow up tomorrow. She does agree to changing the patient to DNR/DNI.  Emotional support and therapeutic listening provided. Discussed the importance of continued conversation with family and the medical providers regarding overall plan of care and treatment options, ensuring decisions are within the context of the patient's values and GOCs. My colleague Kathlyne will follow up tomorrow.   1630: Notified by RN that sister is at bedside and wanted to talk.  Sister reviewed our conversation from this morning. She shared that when she looks at her brother she does not see a dying man-- she can see his decline over time. I reviewed my  understanding of his conditions, including limitations of ongoing interventions and high risk for further decline despite aggressive treatment efforts. I shared I believe he is hospice appropriate. We discussed what his life will likely look like moving forward and emphasized the importance of considering his quality of life, overall suffering, what would be acceptable to him in the future. She states that she would not want to live like him but cannot know for sure what he would want.  Emotional support and therapeutic listening provided.   Recommendations/Plan: Changed to DNR/DNI Family is considering a transition to comfort focused measures PMT will continue to follow     Extensive chart review has been completed prior to seeing the patient including labs, vital signs, imaging, progress/consult notes, orders, medications, and available advance directive documents.  Care plan was discussed with Dr. Leotis and bedside RN  Time spent: 95 minutes  Thank you for allowing the Palliative Medicine Team to assist in the care of this patient.    Stephane CHRISTELLA Palin, NP  Please contact Palliative Medicine Team phone at [phone] for questions and concerns.

## 2024-07-05 NOTE — Progress Notes (Signed)
 PROGRESS NOTE    Brandon [name]  FMW:[id number] DOB: [birth year] DOA: 06/23/2024 PCP: Donal Channing SQUIBB, FNP  Brief Narrative:  This 58 yrs old Male with hx CVA, left hemiparesis, wheelchair and bedbound, 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 in Apr 2025 treated medically given baseline functional status, anxiety, seizures, DM, CAD last PCI >5 years, HTN, chronic hypotension on midodrine , hx chronic pancreatitis who was sent from [facility] for worsening foot wound. In the hospital, MRI foot confirmed osteomyelitis,  vascular consulted.  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 The extent of infection is not curable medically.   Vascular surgery consulted, ABI showed bilateral lower extremity vascular disease, vascular surgery recommended above-knee amputation. However, given his overall prognosis, the medical team strongly recommends against this. Hospice discussions are in process. - Continued on Unasyn,and linezolid.  Now switched to Augmentin and doxycycline. - Consulted palliative care. Code status changed to DNR/DNI.   Dementia: Severe protein calorie malnutrition: Failure to thrive Cerebrovascular disease with left hemiplegia: History of recent epidural abscess April 2025 with cauda equina syndrome, now bilateral lower extremity contractures: -The patient was living at home until 2023. Early that year, he was already described as severely malnourished in the setting of alcoholism, diabetes, chronic pancreatitis, and requiring assistance with ambulating.  -A year later, he had moved in to LTC, was admitted from SNF with fall and IPH, had numerous pressure ulcers at that time.   -From that point on, he appears to  have not been able to walk. -In April of this year, he was admitted for emphysematous pyelonephritis, S pneumo/ESBL Klebsiella bacteremia and large inoperable epidural abscess.  This was treated medically, but by his hospital follow ups late spring, he was unable to turn himself on the gurney and showing crippling contractures in his arms and legs. -With regard to dementia, he has had multiple strokes, subsequent intraparenchymal hemorrhage.  In addition, MRI scans for the last several years have shown age-advanced cerebral atrophy, possibly alcohol  related, or premature dementia.     In totality, with his dementia, malnutrition, paralysis, and contractures, and now osteomyelitis, he appears to have terminal disease, I recommend transition to hospice and have made this recommendation to POA/sister, who is considering. -Continue baclofen, Plavix , Crestor , feeding supplements, vitamin C , MVI, mirtazapine, Buspar  and sertraline  for now    Peripheral vascular disease: Long standing vascular disease.  ABI on left with moderate to severe disease. - Continue Plavix , Crestor .   Anemia: Due to chronic disease. -Trend hemoglobin, Hb relatively stable.   Coronary artery disease: -Continue Plavix , Crestor .   Diabetes Mellitus II: Continue Lantus  14 units daily Continue sliding scale.   Chronic hypotension: -Continue midodrine .   Seizure disorder: No seizures observed. - Continue Keppra .   Chronic pancreatitis: -Continue Creon .   Decision making capacity: The patient does not demonstrate ordered, logical thinking, He is not able to articulate his medical problem, alternative treatment plans, or foreseeable consequences, due to dementia.  His sister has been identified by his facility as his next of kin and surrogate decision maker, no other POA is known.        DVT prophylaxis: Lovenox  Code Status: Full code Family Communication: No family at bed side. Disposition Plan:    Status is:  Inpatient  Remains inpatient appropriate because: Severity of illness. Palliative care consulted.  Called multiple times,  left messages for the sister to call back to make decisions about goals of care. Palliative care was successful in contacting sister CODE STATUS changed to DNR.    Consultants:  Vascular surgery Palliative care  Procedures:   Antimicrobials:  Anti-infectives (From admission, onward)    Start     Dose/Rate Route Frequency Ordered Stop   07/02/24 2200  amoxicillin-clavulanate (AUGMENTIN) 875-125 MG per tablet 1 tablet        1 tablet Oral Every 12 hours 07/02/24 1400     07/02/24 2200  doxycycline (VIBRA-TABS) tablet 100 mg        100 mg Oral Every 12 hours 07/02/24 1400     06/23/24 2200  Ampicillin-Sulbactam (UNASYN) 3 g in sodium chloride  0.9 % 100 mL IVPB  Status:  Discontinued        3 g 200 mL/hr over 30 Minutes Intravenous Every 6 hours 06/23/24 1935 07/02/24 1400   06/23/24 1934  linezolid (ZYVOX) IVPB 600 mg  Status:  Discontinued        600 mg 300 mL/hr over 60 Minutes Intravenous Every 12 hours 06/23/24 1935 07/02/24 1400   06/23/24 1330  Ampicillin-Sulbactam (UNASYN) 3 g in sodium chloride  0.9 % 100 mL IVPB       Placed in And Linked Group   3 g 200 mL/hr over 30 Minutes Intravenous  Once 06/23/24 1321 06/23/24 1936   06/23/24 1330  linezolid (ZYVOX) tablet 600 mg       Placed in And Linked Group   600 mg Oral  Once 06/23/24 1321 06/23/24 1547       Subjective: Patient is seen and examined at bedside. Overnight events noted. Patient lying in the bed , noted  contractures.   He denies any concerns. He is alert and oriented x 2, following commands.  Objective: Vitals:   07/04/24 2117 07/05/24 0453 07/05/24 0500 07/05/24 1104  BP: (!) 130/100 91/64  90/63  Pulse: 74 70  71  Resp: 17   16  Temp: 98.8 F (37.1 C) 98.6 F (37 C)  97.6 F (36.4 C)  TempSrc: Oral   Oral  SpO2: 98% 98%  99%  Weight:   63 kg   Height:         Intake/Output Summary (Last 24 hours) at 07/05/2024 1355 Last data filed at 07/05/2024 0100 Gross per 24 hour  Intake 25 ml  Output 1250 ml  Net -1225 ml   Filed Weights   07/01/24 0500 07/04/24 0500 07/05/24 0500  Weight: 63 kg 63.1 kg 63 kg    Examination:  General exam: Malnourished, dishelved , cachectic, deconditioned, frail, with contractures. Respiratory system: CTA Bilaterally. Respiratory effort normal. RR 16 Cardiovascular system: S1 & S2 heard, RRR. No JVD, murmurs, rubs, gallops or clicks.  Gastrointestinal system: Abdomen is non distended, soft and nontender. Normal bowel sounds heard. Central nervous system: Alert , oriented x 2.  no focal neurological deficits. Extremities: No edema, no cyanosis.  Visible contractures Skin: No rashes, lesions or ulcers Psychiatry: not assessed.  Data Reviewed: I have personally reviewed following labs and imaging studies  CBC: Recent Labs  Lab 06/30/24 0437  WBC 7.5  HGB 8.3*  HCT 26.2*  MCV 90.0  PLT 416*   Basic Metabolic Panel: Recent Labs  Lab 06/30/24 0437  NA 134*  K 4.3  CL 99  CO2 23  GLUCOSE 230*  BUN 12  CREATININE 0.51*  CALCIUM  8.6*   GFR: Estimated Creatinine Clearance: 87.5 mL/min (A) (by C-G formula based on SCr of 0.51 mg/dL (L)). Liver Function Tests: Recent Labs  Lab 06/30/24 0437  AST 19  ALT 17  ALKPHOS 120  BILITOT 0.3  PROT 6.5  ALBUMIN  2.1*   No results for input(s): LIPASE, AMYLASE in the last 168 hours. No results for input(s): AMMONIA in the last 168 hours. Coagulation Profile: No results for input(s): INR, PROTIME in the last 168 hours. Cardiac Enzymes: No results for input(s): CKTOTAL, CKMB, CKMBINDEX, TROPONINI in the last 168 hours. BNP (last 3 results) No results for input(s): PROBNP in the last 8760 hours. HbA1C: No results for input(s): HGBA1C in the last 72 hours. CBG: Recent Labs  Lab 07/04/24 1139 07/04/24 1625 07/04/24 2111  07/05/24 0736 07/05/24 1206  GLUCAP 318* 72 285* 116* 366*   Lipid Profile: No results for input(s): CHOL, HDL, LDLCALC, TRIG, CHOLHDL, LDLDIRECT in the last 72 hours. Thyroid  Function Tests: No results for input(s): TSH, T4TOTAL, FREET4, T3FREE, THYROIDAB in the last 72 hours. Anemia Panel: No results for input(s): VITAMINB12, FOLATE, FERRITIN, TIBC, IRON, RETICCTPCT in the last 72 hours. Sepsis Labs: No results for input(s): PROCALCITON, LATICACIDVEN in the last 168 hours.   No results found for this or any previous visit (from the past 240 hours).  Radiology Studies: No results found.  Scheduled Meds:  amoxicillin-clavulanate  1 tablet Oral Q12H   ascorbic acid   500 mg Oral Daily   baclofen  20 mg Oral TID   busPIRone   10 mg Oral BID   clopidogrel   75 mg Oral Daily   doxycycline  100 mg Oral Q12H   enoxaparin  (LOVENOX ) injection  40 mg Subcutaneous Q24H   famotidine   20 mg Oral BID   feeding supplement (GLUCERNA SHAKE)  237 mL Oral TID BM   ferrous sulfate   325 mg Oral Q breakfast   insulin  aspart  0-15 Units Subcutaneous TID WC   insulin  aspart  0-5 Units Subcutaneous QHS   insulin  glargine  14 Units Subcutaneous Daily   levETIRAcetam   500 mg Oral BID   lipase/protease/amylase  12,000 Units Oral TID WC   melatonin  3 mg Oral QHS   midodrine   10 mg Oral TID WC   mirtazapine  7.5 mg Oral QHS   multivitamin with minerals  1 tablet Oral Daily   mupirocin  ointment  1 Application Topical Daily   rosuvastatin   5 mg Oral QHS   senna  2 tablet Oral BID   sertraline   100 mg Oral Daily   tamsulosin   0.4 mg Oral QHS   zinc sulfate (50mg  elemental zinc)  220 mg Oral Daily   Continuous Infusions:     LOS: 12 days    Time spent: 35 mins    Darcel Dawley, MD [facility]   If 7PM-7AM, please contact night-coverage

## 2024-07-05 NOTE — Progress Notes (Signed)
 All dressings were changed  per order and patient was bathed. Condom cath replaced as it had come off. Patient states that he likes to take his meds whole with Ensure. Media added to chart.

## 2024-07-06 DIAGNOSIS — E11621 Type 2 diabetes mellitus with foot ulcer: Secondary | ICD-10-CM | POA: Diagnosis not present

## 2024-07-06 DIAGNOSIS — L97422 Non-pressure chronic ulcer of left heel and midfoot with fat layer exposed: Secondary | ICD-10-CM | POA: Diagnosis not present

## 2024-07-06 LAB — GLUCOSE, CAPILLARY
Glucose-Capillary: 168 mg/dL — ABNORMAL HIGH (ref 70–99)
Glucose-Capillary: 281 mg/dL — ABNORMAL HIGH (ref 70–99)
Glucose-Capillary: 218 mg/dL — ABNORMAL HIGH (ref 70–99)
Glucose-Capillary: 292 mg/dL — ABNORMAL HIGH (ref 70–99)

## 2024-07-06 NOTE — Progress Notes (Signed)
 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 In the hospital, MRI foot confirmed osteomyelitis,  vascular consulted.  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 The extent of infection is not curable medically.   Vascular surgery consulted, ABI showed bilateral lower extremity vascular disease, vascular surgery recommended above-knee amputation. However, given his overall prognosis, the medical team strongly recommends against this. Hospice discussions are in process. - Continued on Unasyn,and linezolid.  Now switched to Augmentin and doxycycline. - Consulted palliative care. Code status changed to DNR/DNI. - Sister is considering transition to comfort focused measures but not decided at this time.  Needs continued ongoing GOC conversation.   Dementia: Severe protein calorie malnutrition: Failure to thrive Cerebrovascular disease with left hemiplegia: History of recent epidural abscess April 2025 with cauda equina syndrome, now bilateral lower extremity contractures: -The patient was living at home until 2023. Early that year, he was already described as severely malnourished in the setting of alcoholism, diabetes, chronic pancreatitis, and requiring assistance with ambulating.  -A year later, he had  moved in to LTC, was admitted from SNF with fall and IPH, had numerous pressure ulcers at that time.   -From that point on, he appears to have not been able to walk. -In April of this year, he was admitted for emphysematous pyelonephritis, S pneumo/ESBL Klebsiella bacteremia and large inoperable epidural abscess.  This was treated medically, but by his hospital follow ups late spring, he was unable to turn himself on the gurney and showing crippling contractures in his arms and legs. -With regard to dementia, he has had multiple strokes, subsequent intraparenchymal hemorrhage.  In addition, MRI scans for the last several years have shown age-advanced cerebral atrophy, possibly alcohol  related, or premature dementia.     In totality, with his dementia, malnutrition, paralysis, and contractures, and now osteomyelitis, he appears to have terminal disease, I recommend transition to hospice and have made this recommendation to POA/sister, who is considering. -Continue baclofen, Plavix , Crestor , feeding supplements, vitamin C , MVI, mirtazapine, Buspar  and sertraline  for now    Peripheral vascular disease: Long standing vascular disease.  ABI on left with moderate to severe disease. - Continue Plavix , Crestor .   Anemia: Due to chronic disease. -Trend hemoglobin, Hb relatively stable.   Coronary artery disease: -Continue Plavix , Crestor .   Diabetes Mellitus II: Continue Lantus  14 units daily Continue sliding scale.   Chronic hypotension: -Continue midodrine .   Seizure disorder: No seizures observed. - Continue Keppra .   Chronic pancreatitis: -Continue Creon .   Decision making capacity: The patient does not demonstrate ordered, logical thinking, He is not able to articulate his medical problem, alternative treatment plans, or foreseeable consequences, due to dementia.  His sister has been identified by his facility as his next of kin and surrogate decision maker, no other POA is known.       *Sister is considering  transition to comfort focused measures but not decided at this time.  Needs continued ongoing GOC conversation.   DVT prophylaxis: Lovenox  Code Status: Full code Family Communication: No family at bed side. Disposition Plan:    Status is: Inpatient Remains inpatient appropriate because: Severity of illness.   Sister is considering transition to comfort focused measures but not decided at this time.  Needs continued ongoing GOC conversation.  Consultants:  Vascular surgery Palliative care  Procedures:   Antimicrobials:  Anti-infectives (From admission, onward)    Start     Dose/Rate Route Frequency Ordered Stop   07/02/24 2200  amoxicillin-clavulanate (AUGMENTIN) 875-125 MG per tablet 1 tablet        1 tablet Oral Every 12 hours 07/02/24 1400     07/02/24 2200  doxycycline (VIBRA-TABS) tablet 100 mg        100 mg Oral Every 12 hours 07/02/24 1400     06/23/24 2200  Ampicillin-Sulbactam (UNASYN) 3 g in sodium chloride  0.9 % 100 mL IVPB  Status:  Discontinued        3 g 200 mL/hr over 30 Minutes Intravenous Every 6 hours 06/23/24 1935 07/02/24 1400   06/23/24 1934  linezolid (ZYVOX) IVPB 600 mg  Status:  Discontinued        600 mg 300 mL/hr over 60 Minutes Intravenous Every 12 hours 06/23/24 1935 07/02/24 1400   06/23/24 1330  Ampicillin-Sulbactam (UNASYN) 3 g in sodium chloride  0.9 % 100 mL IVPB       Placed in And Linked Group   3 g 200 mL/hr over 30 Minutes Intravenous  Once 06/23/24 1321 06/23/24 1936   06/23/24 1330  linezolid (ZYVOX) tablet 600 mg       Placed in And Linked Group   600 mg Oral  Once 06/23/24 1321 06/23/24 1547       Subjective: Patient is seen and examined at bedside. Overnight events noted. Patient lying in the bed , noted  contractures.   He denies any concerns. He is alert and oriented x 2, following commands.  Objective: Vitals:   07/05/24 1104 07/05/24 2209 07/06/24 0500 07/06/24 0949  BP: 90/63 (!) 92/56 (!)  96/52 100/64  Pulse: 71 66 60 75  Resp: 16 18 18 17   Temp: 97.6 F (36.4 C) 97.8 F (36.6 C) (!) 97.5 F (36.4 C)   TempSrc: Oral Oral Oral   SpO2: 99% 100% 99% 100%  Weight:   58 kg   Height:        Intake/Output Summary (Last 24 hours) at 07/06/2024 1444 Last data filed at 07/06/2024 1130 Gross per 24 hour  Intake 610 ml  Output 400 ml  Net 210 ml   Filed Weights   07/04/24 0500 07/05/24 0500 07/06/24 0500  Weight: 63.1 kg 63 kg 58 kg    Examination:  General exam: Malnourished, dishelved , cachectic, deconditioned, frail, with contractures. Respiratory system: CTA Bilaterally. Respiratory effort normal. RR 16 Cardiovascular system: S1 & S2 heard, RRR. No JVD, murmurs, rubs, gallops or clicks.  Gastrointestinal system: Abdomen is non distended, soft and nontender. Normal bowel sounds heard. Central nervous system: Alert , oriented x 2.  no focal neurological deficits. Extremities: No edema, no cyanosis.  Visible contractures Skin: No rashes, lesions or ulcers Psychiatry: not assessed.  Data Reviewed: I have personally reviewed following labs and imaging studies  CBC: Recent Labs  Lab 06/30/24 0437  WBC 7.5  HGB 8.3*  HCT 26.2*  MCV 90.0  PLT 416*  Basic Metabolic Panel: Recent Labs  Lab 06/30/24 0437  NA 134*  K 4.3  CL 99  CO2 23  GLUCOSE 230*  BUN 12  CREATININE 0.51*  CALCIUM  8.6*   GFR: Estimated Creatinine Clearance: 80.6 mL/min (A) (by C-G formula based on SCr of 0.51 mg/dL (L)). Liver Function Tests: Recent Labs  Lab 06/30/24 0437  AST 19  ALT 17  ALKPHOS 120  BILITOT 0.3  PROT 6.5  ALBUMIN  2.1*   No results for input(s): LIPASE, AMYLASE in the last 168 hours. No results for input(s): AMMONIA in the last 168 hours. Coagulation Profile: No results for input(s): INR, PROTIME in the last 168 hours. Cardiac Enzymes: No results for input(s): CKTOTAL, CKMB, CKMBINDEX, TROPONINI in the last 168 hours. BNP (last 3  results) No results for input(s): PROBNP in the last 8760 hours. HbA1C: No results for input(s): HGBA1C in the last 72 hours. CBG: Recent Labs  Lab 07/05/24 1206 07/05/24 1651 07/05/24 2208 07/06/24 0837 07/06/24 1142  GLUCAP 366* 90 197* 168* 281*   Lipid Profile: No results for input(s): CHOL, HDL, LDLCALC, TRIG, CHOLHDL, LDLDIRECT in the last 72 hours. Thyroid  Function Tests: No results for input(s): TSH, T4TOTAL, FREET4, T3FREE, THYROIDAB in the last 72 hours. Anemia Panel: No results for input(s): VITAMINB12, FOLATE, FERRITIN, TIBC, IRON, RETICCTPCT in the last 72 hours. Sepsis Labs: No results for input(s): PROCALCITON, LATICACIDVEN in the last 168 hours.   No results found for this or any previous visit (from the past 240 hours).  Radiology Studies: No results found.  Scheduled Meds:  amoxicillin-clavulanate  1 tablet Oral Q12H   ascorbic acid   500 mg Oral Daily   baclofen  20 mg Oral TID   busPIRone   10 mg Oral BID   clopidogrel   75 mg Oral Daily   doxycycline  100 mg Oral Q12H   enoxaparin  (LOVENOX ) injection  40 mg Subcutaneous Q24H   famotidine   20 mg Oral BID   feeding supplement (GLUCERNA SHAKE)  237 mL Oral TID BM   ferrous sulfate   325 mg Oral Q breakfast   insulin  aspart  0-15 Units Subcutaneous TID WC   insulin  aspart  0-5 Units Subcutaneous QHS   insulin  glargine  14 Units Subcutaneous Daily   levETIRAcetam   500 mg Oral BID   lipase/protease/amylase  12,000 Units Oral TID WC   melatonin  3 mg Oral QHS   midodrine   10 mg Oral TID WC   mirtazapine  7.5 mg Oral QHS   multivitamin with minerals  1 tablet Oral Daily   mupirocin  ointment  1 Application Topical Daily   rosuvastatin   5 mg Oral QHS   senna  2 tablet Oral BID   sertraline   100 mg Oral Daily   tamsulosin   0.4 mg Oral QHS   zinc sulfate (50mg  elemental zinc)  220 mg Oral Daily   Continuous Infusions:     LOS: 13 days    Time spent: 35  mins    Darcel Dawley, MD [facility]   If 7PM-7AM, please contact night-coverage

## 2024-07-06 NOTE — Progress Notes (Signed)
Pt refused dressing changes

## 2024-07-06 NOTE — Plan of Care (Signed)
  Problem: Education: Goal: Ability to describe self-care measures that may prevent or decrease complications (Diabetes Survival Skills Education) will improve Outcome: Progressing   Problem: Metabolic: Goal: Ability to maintain appropriate glucose levels will improve Outcome: Progressing   Problem: Nutritional: Goal: Maintenance of adequate nutrition will improve Outcome: Progressing   Problem: Health Behavior/Discharge Planning: Goal: Ability to manage health-related needs will improve Outcome: Progressing   Problem: Coping: Goal: Level of anxiety will decrease Outcome: Progressing

## 2024-07-06 NOTE — Progress Notes (Signed)
 Palliative Medicine Inpatient Follow Up Note   HPI:  58 y.o. male  with past medical history of CVA, left hemiparesis, wheelchair and bedbound, 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 in Apr 2025 treated medically given baseline functional status, anxiety, seizures, DM, CAD last PCI >5 years, HTN, chronic hypotension on midodrine , hx chronic pancreatitis  admitted on 06/23/2024 with worsening left foot wound. Imaging confirms left fifth metatarsal osteomyelitis.    Worth to note, this patient is being followed by Vascular surgery, they recommended left above-knee amputation.   PMT has been consulted to assist with goals of care conversation. Patient/Family face treatment option decisions, advanced directive decisions and anticipatory care needs.    Patient's sister face treatment option decision, advance directive decisions and anticipatory care needs.   Patient transitioned from full code to DNR-limited on 07/05/2024.    Today's Discussion 07/06/2024  *Please note that this is a verbal dictation therefore any spelling or grammatical errors are due to the Dragon Medical One system interpretation.  Chart reviewed inclusive of vital signs, progress notes, laboratory results, and diagnostic images.   I visited the patient at bedside today. No family members were present during the encounter. The patient was alert and able to express basic needs. He appeared chronically ill and significantly deconditioned, with contractures noted in all four extremities. Multiple scattered wounds with mepilex dressing also noted.  While he was able to engage in conversation, his thought process lacked order and clarity, and he struggled to articulate his medical condition. When asked about his immediate needs, the patient reported experiencing generalized pain, describing it as achy and rating it 7-8 out of 10. He also stated that he was hungry and ready for breakfast. I informed him  that a staff member would assist him with his meal shortly.  I briefly reviewed his current medical condition, including the reason for hospitalization, an unstageable left foot ulcer complicated by osteomyelitis. I explained that the infection may not be surgically curable and that vascular surgery had initially recommended an above-knee amputation. However, considering his overall poor health status and high disease burden, this option is not favorable.   Given the patient's ability to communicate basic preferences, I initiated brief and basic  goals of care discussion. I explained the options between continuing aggressive medical interventions versus shifting toward comfort-focused care aimed at optimizing quality of life and minimizing suffering. The patient clearly expressed that he does not wish to pursue any aggressive treatments. He specifically stated that undergoing procedures such as amputation would be a fight, and he strongly refused any surgical or invasive interventions. He stated if all damn things fail, I want to just be comfortable. Based on his expressed preferences, a comfort-focused approach, including hospice care, would be appropriate and aligned with his goals. However, given his underlying dementia, while able to express basic thoughts, he is not in capacity to make healthcare decision-making. His sister Brandon is the next of kin, only sibling reasonably able to be his Management consultant. As of latest conversation between PMT and Brandon, she remains struggling to make decision whether to continue aggressive treatments or allow for comfort-focused care.   I attempted to reach Brandon [name] by phone to continue the ongoing goals-of-care conversation but was unable to connect. I left a voicemail requesting a return call. I also advised the bedside RN to notify me via chat if the patient's sister arrives for a visit.    Palliative Support Provided.   Objective Assessment: Vital  Signs Vitals:   07/05/24 2209 07/06/24 0500  BP: (!) 92/56 (!) 96/52  Pulse: 66 60  Resp: 18 18  Temp: 97.8 F (36.6 C) (!) 97.5 F (36.4 C)  SpO2: 100% 99%   No intake or output data in the 24 hours ending 07/06/24 0807 Last Weight  Most recent update: 07/06/2024  5:27 AM    Weight  58 kg (127 lb 13.9 oz)             Physical Exam Vitals reviewed.   Constitutional:      General: He is sleeping. He is not in acute distress.    Appearance: He is cachectic. He is ill-appearing.     Comments: All extremities contractured  HENT:     Head: Normocephalic and atraumatic.  Cardiovascular:     Rate and Rhythm: Normal rate.  Pulmonary:     Effort: Pulmonary effort is normal.  Skin:    Comments: Multiple meliplex over body   SUMMARY OF RECOMMENDATIONS    Recommendations/Plan:  Code Status: Maintain DNR-Limited Sister of patient is considering a transition to comfort focused measures, but very undecided, needs continued ongoing GOC conversation Continue to provide psycho-social and emotional support to patient and family Palliative medicine team will continue to follow.   Symptom Management: Per Primary team  Time Spent: 35 minutes  Detailed review of medical records (labs, imaging, vital signs), medically appropriate exam, discussed with treatment team, counseling and education to patient, family, & staff, documenting clinical information, coordination of care.   ______________________________________________________________________________________ Kathlyne Bolder NP-C [place] Palliative Medicine Team Team Cell Phone: [phone] Please utilize secure chat with additional questions, if there is no response within 30 minutes please call the above phone number  Palliative Medicine Team providers are available by phone from 7am to 7pm daily and can be reached through the team cell phone.  Should this patient require assistance outside of these hours, please call the  patient's attending physician.

## 2024-07-07 DIAGNOSIS — L97429 Non-pressure chronic ulcer of left heel and midfoot with unspecified severity: Secondary | ICD-10-CM | POA: Diagnosis not present

## 2024-07-07 DIAGNOSIS — E11621 Type 2 diabetes mellitus with foot ulcer: Secondary | ICD-10-CM | POA: Diagnosis not present

## 2024-07-07 LAB — GLUCOSE, CAPILLARY
Glucose-Capillary: 102 mg/dL — ABNORMAL HIGH (ref 70–99)
Glucose-Capillary: 276 mg/dL — ABNORMAL HIGH (ref 70–99)

## 2024-07-07 MED ORDER — BACLOFEN 20 MG PO TABS: 20.0000 mg | ORAL_TABLET | Freq: Three times a day (TID) | ORAL | 0 refills | Status: DC

## 2024-07-07 MED ORDER — DOXYCYCLINE HYCLATE 100 MG PO TABS: 100.0000 mg | ORAL_TABLET | Freq: Two times a day (BID) | ORAL | Status: DC

## 2024-07-07 MED ORDER — AMOXICILLIN-POT CLAVULANATE 875-125 MG PO TABS: 1.0000 | ORAL_TABLET | Freq: Two times a day (BID) | ORAL | Status: DC

## 2024-07-07 MED ORDER — OXYCODONE HCL 5 MG PO TABS: 5.0000 mg | ORAL_TABLET | Freq: Four times a day (QID) | ORAL | 0 refills | Status: DC | PRN

## 2024-07-07 MED ORDER — ZINC SULFATE 220 (50 ZN) MG PO CAPS: 220.0000 mg | ORAL_CAPSULE | Freq: Every day | ORAL | Status: AC

## 2024-07-07 NOTE — TOC Transition Note (Signed)
 Transition of Care [facility]) - Discharge Note   Patient Details  Name: Brandon [name] MRN: [id number] Date of Birth: [birth year]  [facility] CM/SW Contact:  Jeoffrey LITTIE Maranda ISRAEL Phone Number: 07/07/2024, 11:04 AM   Clinical Narrative:    Patient will DC to: [facility] Anticipated DC date: 07/07/24 Family notified: Yes Transport by: [facility]   Per MD patient ready for DC to [facility]. RN to call report prior to discharge [phone] room 206B. RN, patient, patient's family, and facility notified of DC. Discharge Summary and FL2 sent to facility. DC packet on chart. Ambulance transport requested for patient.   CSW will sign off for now as social work intervention is no longer needed. Please consult us  again if new needs arise.     Final next level of care: Long Term Nursing Home Barriers to Discharge: Barriers Resolved   Patient Goals and CMS Choice Patient states their goals for this hospitalization and ongoing recovery are:: put me back in bed (in the bed)          Discharge Placement   Existing PASRR number confirmed : 07/07/24          Patient chooses bed at: Other - please specify in the comment section below: [facility]) Patient to be transferred to facility by: [facility] Name of family member notified: [name] Patient and family notified of of transfer: 07/07/24  Discharge Plan and Services Additional resources added to the After Visit Summary for   In-house Referral: Clinical Social Work              DME Arranged: N/A DME Agency: NA                  Social Drivers of Health (SDOH) Interventions SDOH Screenings   Food Insecurity: No Food Insecurity (06/23/2024)  Housing: Low Risk  (06/23/2024)  Transportation Needs: No Transportation Needs (06/23/2024)  Utilities: Not At Risk (06/23/2024)  Physical Activity: Inactive (11/15/2021)  Stress: No Stress Concern Present (10/31/2021)  Tobacco Use: High Risk (06/30/2024)     Readmission  Risk Interventions    06/25/2024   11:20 AM 01/08/2024   11:12 AM  Readmission Risk Prevention Plan  Transportation Screening Complete Complete  Medication Review Oceanographer) Complete Complete  PCP or Specialist appointment within 3-5 days of discharge Complete Complete  HRI or Home Care Consult Complete Complete  SW Recovery Care/Counseling Consult Complete Complete  Palliative Care Screening  Not Applicable  Skilled Nursing Facility Complete Complete

## 2024-07-07 NOTE — Plan of Care (Signed)
  Problem: Nutritional: Goal: Maintenance of adequate nutrition will improve Outcome: Progressing   Problem: Pain Managment: Goal: General experience of comfort will improve and/or be controlled Outcome: Progressing   Problem: Safety: Goal: Ability to remain free from injury will improve Outcome: Progressing

## 2024-07-07 NOTE — Discharge Summary (Signed)
 Physician Discharge Summary  RAMIL EDGINGTON FMW:[id number] DOB: 08/02/1966 DOA: 06/23/2024  PCP: Donal Channing SQUIBB, FNP  Admit date: 06/23/2024 Discharge date: 07/07/2024  Admitted From:  Discharge disposition: Return to long-term care   Recommendations for Outpatient Follow-Up:   Palliative care to follow at SNF-recommendations are for comfort care as he is not a candidate for surgery per vascular surgery switched to Augmentin and doxycycline-continue for 4 weeks for now, follow-up with PCP to extend if needed   Discharge Diagnosis:   Principal Problem:   Diabetic foot ulcer (HCC) Active Problems:   Decreased pedal pulses    Discharge Condition: Improved.  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 In the hospital, MRI foot confirmed osteomyelitis,  vascular consulted.     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 The extent of infection is not curable medically.   Vascular surgery consulted, ABI showed bilateral lower extremity vascular disease, vascular surgery recommended above-knee amputation. However, given his overall prognosis, the medical team strongly recommends against this.--Family considering comfort care -  switched to Augmentin and doxycycline as above - Consulted palliative care. Code status changed to DNR/DNI. - Sister is considering transition to comfort focused  measures but not decided at this time.  Needs continued ongoing GOC conversation at facility   Dementia: Severe protein calorie malnutrition: Failure to thrive Cerebrovascular disease with left hemiplegia: History of recent epidural abscess April 2025 with cauda equina syndrome, now bilateral lower extremity contractures: -The patient was living at home until 2023. Early that year, he was already described as severely malnourished in the setting of alcoholism, diabetes, chronic pancreatitis, and requiring assistance with ambulating.  -A year later, he had moved in to LTC, was admitted from SNF with fall and IPH, had numerous pressure ulcers at that time.   -From that point on, he appears to have not been able to walk. -In April of this year, he was admitted for emphysematous pyelonephritis, S pneumo/ESBL Klebsiella bacteremia and large inoperable epidural abscess.  This was treated medically, but by his hospital follow ups late spring, he was unable to turn himself on the gurney and showing crippling contractures in his arms and legs. -With regard to dementia, he has had multiple strokes, subsequent intraparenchymal hemorrhage.  In addition, MRI scans for the last several years have shown age-advanced cerebral atrophy, possibly alcohol  related, or premature dementia.     In totality, with his dementia, malnutrition, paralysis, and contractures, and now osteomyelitis, he appears to have terminal disease, I recommend transition to hospice and have made this recommendation to POA/sister, who is considering-needs to continue to work with palliative for goals of care -Continue baclofen, Plavix , Crestor , feeding supplements, vitamin C , MVI, mirtazapine, Buspar  and sertraline  for now    Peripheral vascular disease: Long standing vascular disease.  ABI on left with moderate to severe disease. - Continue Plavix , Crestor . -Not a candidate for surgery at this time per vascular   Anemia:  Due to chronic  disease. - Stable   Coronary artery disease: -Continue Plavix , Crestor .   Diabetes Mellitus II: Continue Lantus  14 units daily Continue sliding scale.   Chronic hypotension: -Continue midodrine .   Seizure disorder: - Continue Keppra .   Chronic pancreatitis: -Continue Creon .    Medical Consultants:   Vascular Palliative care   Discharge Exam:   Vitals:   07/07/24 0610 07/07/24 0958  BP: (!) 140/90 107/72  Pulse: 74 (!) 103  Resp: 18 16  Temp: 97.9 F (36.6 C) 98.2 F (36.8 C)  SpO2: 98% 98%   Vitals:   07/06/24 1658 07/06/24 2149 07/07/24 0610 07/07/24 0958  BP: (!) 88/57 103/60 (!) 140/90 107/72  Pulse: 70 68 74 (!) 103  Resp: 15 17 18 16   Temp: 97.6 F (36.4 C) 97.7 F (36.5 C) 97.9 F (36.6 C) 98.2 F (36.8 C)  TempSrc: Oral Oral Oral Oral  SpO2: 98% 99% 98% 98%  Weight:   60 kg   Height:        General exam: Appears calm and comfortable.    The results of significant diagnostics from this hospitalization (including imaging, microbiology, ancillary and laboratory) are listed below for reference.     Procedures and Diagnostic Studies:   MR FOOT LEFT W WO CONTRAST Result Date: 06/24/2024 EXAM: MRI of the Left Foot with and without contrast. 06/24/2024 06:50:54 PM TECHNIQUE: Multiplanar multisequence MRI of the left foot was performed with and without the administration of intravenous contrast. Gadobutrol  (GADAVIST ) 1 MMOL/ML injection 6 mL was administered. Reduced sensitivity due to patient motion and difficulty with positioning due to patient contraction. COMPARISON: None available. CLINICAL HISTORY: Soft tissue infection suspected, concerns for osteomyelitis, wound on lateral aspect of left foot, history of diabetes. FINDINGS: LISFRANC JOINT: Visualized Lisfranc ligament is intact. No significant Lisfranc interval widening or significant periligamentous edema. BONE MARROW: Marrow edema and enhancement proximally in the 5th metatarsal with some  cortical indistinctness suspicious for active osteomyelitis. Endosteal edema in the adjacent cuboid anteriorly and laterally may be reactive or may reflect early osteomyelitis. Similarly, endosteal edema along the base of the 4th metatarsal could be from reactive edema or early osteomyelitis. GREATER AND LESSER MTP JOINTS: No significant joint effusion or osseous erosions. No significant degenerative changes. Normal alignment. SOFT TISSUES: Suspected cutaneous ulceration overlying the base of the 5th metatarsal, potentially with some gas in the adjacent soft tissues. Trace diffuse regional muscular edema, possibly neurogenic. Dorsal subcutaneous edema in the forefoot, nonspecific for cellulitis versus sterile edema. TENDONS: Visualized flexor and extensor tendons are intact without tenosynovitis. IMPRESSION: 1. Active osteomyelitis suspected in the proximal 5th metatarsal. 2. Possible early osteomyelitis versus reactive endosteal edema in the adjacent cuboid and base of the 4th metatarsal. 3. Suspected cutaneous ulceration over the base of the 5th metatarsal with possible adjacent soft tissue gas. 4. Trace diffuse regional muscular edema, possibly neurogenic. 5. Dorsal forefoot subcutaneous edema, nonspecific for cellulitis versus sterile edema. Electronically signed by: Ryan Salvage MD 06/24/2024 07:01 PM EDT RP Workstation: [id number]   VAS US  ABI WITH/WO TBI Result Date: 06/24/2024  LOWER EXTREMITY DOPPLER STUDY Patient Name:  Brandon [name]  Date of Exam:   06/24/2024 Medical Rec #: [id number]         Accession #:    [id number] Date of Birth: [birth year]         Patient Gender: M Patient Age:   37 years Exam Location:  [facility] Procedure:      VAS US  ABI  WITH/WO TBI Referring Phys: --------------------------------------------------------------------------------  Indications: Left stage IV pressure wound with visualized bone, left lateral              foot. High Risk Factors: Hypertension,  hyperlipidemia, Diabetes, past history of                    smoking, coronary artery disease, stenting 2016, prior CVA                    with residual hemiplegia, wheelchair/bed bound. Other Factors: History of ETOH abuse.  Vascular Interventions: Carotid artery disease, status post left carotid stent                         03/2015, right ICA occlusion. Bilateral renal artery                         stenosis. Limitations: Today's exam was limited due to Contracture and bandages. Patient              not compliant with full exam. Comparison Study: No prior ABI on file Performing Technologist: Rachel Pellet RVS  Examination Guidelines: A complete evaluation includes at minimum, Doppler waveform signals and systolic blood pressure reading at the level of bilateral brachial, anterior tibial, and posterior tibial arteries, when vessel segments are accessible. Bilateral testing is considered an integral part of a complete examination. Photoelectric Plethysmograph (PPG) waveforms and toe systolic pressure readings are included as required and additional duplex testing as needed. Limited examinations for reoccurring indications may be performed as noted.  ABI Findings: +---------+------------------+-----+----------+-----------------------+ Right    Rt Pressure (mmHg)IndexWaveform  Comment                 +---------+------------------+-----+----------+-----------------------+ Brachial                                  Contracture/positioning +---------+------------------+-----+----------+-----------------------+ PTA      81                0.74 monophasic                        +---------+------------------+-----+----------+-----------------------+ DP       100               0.91 monophasic                        +---------+------------------+-----+----------+-----------------------+ Great Toe49                0.45 Abnormal                           +---------+------------------+-----+----------+-----------------------+ +---------+------------------+-----+-------------------+--------------------+ Left     Lt Pressure (mmHg)IndexWaveform           Comment              +---------+------------------+-----+-------------------+--------------------+ Brachial 110                    monophasic                              +---------+------------------+-----+-------------------+--------------------+ PTA  bandages/contracture +---------+------------------+-----+-------------------+--------------------+ PERO                                               bandages/contracture +---------+------------------+-----+-------------------+--------------------+ DP       64                0.58 dampened monophasic                     +---------+------------------+-----+-------------------+--------------------+ Great Toe0                 0.00 Absent                                  +---------+------------------+-----+-------------------+--------------------+ +-------+-----------+-----------+------------+------------+ ABI/TBIToday's ABIToday's TBIPrevious ABIPrevious TBI +-------+-----------+-----------+------------+------------+ Right  0.91       0.45                                +-------+-----------+-----------+------------+------------+ Left   0.58       absent                              +-------+-----------+-----------+------------+------------+  Summary: Right: The right toe-brachial index is abnormal. Although ankle brachial indices are mildly reduced (0.80-0.94), arterial Doppler waveforms at the ankle suggest some component of arterial occlusive disease. Left: Resting left ankle-brachial index indicates moderate to severe left lower extremity arterial disease. The left toe-brachial index is abnormal.  *See table(s) above for measurements and observations.  Electronically  signed by Debby Robertson on 06/24/2024 at 5:39:33 PM.    Final    DG Foot Complete Left Result Date: 06/23/2024 CLINICAL DATA:  Pressure ulcer of left foot. EXAM: LEFT FOOT - COMPLETE 3+ VIEW COMPARISON:  None Available. FINDINGS: Mild diffuse decreased bone mineralization. Minimal degenerative changes of the first MTP joint. Mild degenerative changes over the midfoot and hindfoot. No acute fracture or dislocation. Soft tissue changes over the plantar lateral aspect of the midfoot at the level of the base of the fifth metatarsal. Subtle lucency within the adjacent base of the fifth metatarsal which could be seen due to infection. No significant air in the soft tissues. IMPRESSION: Soft tissue changes over the plantar lateral aspect of the midfoot at the level of the base of the fifth metatarsal. Subtle lucency within the adjacent base of the fifth metatarsal which could be due to infection. Consider MRI for further evaluation. Electronically Signed   By: Toribio Agreste M.D.   On: 06/23/2024 15:48     Labs:   Basic Metabolic Panel: No results for input(s): NA, K, CL, CO2, GLUCOSE, BUN, CREATININE, CALCIUM , MG, PHOS in the last 168 hours. GFR Estimated Creatinine Clearance: 83.3 mL/min (A) (by C-G formula based on SCr of 0.51 mg/dL (L)). Liver Function Tests: No results for input(s): AST, ALT, ALKPHOS, BILITOT, PROT, ALBUMIN  in the last 168 hours. No results for input(s): LIPASE, AMYLASE in the last 168 hours. No results for input(s): AMMONIA in the last 168 hours. Coagulation profile No results for input(s): INR, PROTIME in the last 168 hours.  CBC: No results for input(s): WBC, NEUTROABS, HGB, HCT, MCV, PLT in the last 168 hours. Cardiac Enzymes: No results for input(s): CKTOTAL, CKMB, CKMBINDEX, TROPONINI in  the last 168 hours. BNP: Invalid input(s): POCBNP CBG: Recent Labs  Lab 07/06/24 1142 07/06/24 1655 07/06/24 2154  07/07/24 0748 07/07/24 1204  GLUCAP 281* 218* 292* 102* 276*   D-Dimer No results for input(s): DDIMER in the last 72 hours. Hgb A1c No results for input(s): HGBA1C in the last 72 hours. Lipid Profile No results for input(s): CHOL, HDL, LDLCALC, TRIG, CHOLHDL, LDLDIRECT in the last 72 hours. Thyroid  function studies No results for input(s): TSH, T4TOTAL, T3FREE, THYROIDAB in the last 72 hours.  Invalid input(s): FREET3 Anemia work up No results for input(s): VITAMINB12, FOLATE, FERRITIN, TIBC, IRON, RETICCTPCT in the last 72 hours. Microbiology No results found for this or any previous visit (from the past 240 hours).   Discharge Instructions:   Discharge Instructions     Diet Carb Modified   Complete by: As directed    Discharge wound care:   Complete by: As directed    Daily: 1. Cleanse L lateral foot wound with Vashe wound cleanser do not rinse and allow to air dry. Apply Vashe moistened gauze to wound bed daily and secure with ABD pad and Kerlix roll gauze.   2. Cleanse L heel wound with Vashe, apply Xeroform gauze and secure with silicone foam.  Place L foot in Prevalon boot to offload pressure.   Increase activity slowly   Complete by: As directed       Allergies as of 07/07/2024   No Known Allergies      Medication List     STOP taking these medications    multivitamin with minerals Tabs tablet   nystatin cream Commonly known as: MYCOSTATIN   Santyl 250 UNIT/GM ointment Generic drug: collagenase   traZODone  50 MG tablet Commonly known as: DESYREL    zinc oxide 20 % ointment       TAKE these medications    acetaminophen  325 MG tablet Commonly known as: TYLENOL  Take 650 mg by mouth every 6 (six) hours as needed for mild pain (pain score 1-3) (cannot exceed a total of 3,000 mg/24 hours).   amoxicillin-clavulanate 875-125 MG tablet Commonly known as: AUGMENTIN Take 1 tablet by mouth every 12 (twelve)  hours.   ascorbic acid  500 MG tablet Commonly known as: VITAMIN C  Take 500 mg by mouth daily.   baclofen 20 MG tablet Commonly known as: LIORESAL Take 1 tablet (20 mg total) by mouth 3 (three) times daily.   busPIRone  10 MG tablet Commonly known as: BUSPAR  Take 1 tablet (10 mg total) by mouth 2 (two) times daily.   clopidogrel  75 MG tablet Commonly known as: PLAVIX  Take 1 tablet (75 mg total) by mouth daily.   Creon  3000-9500 units Cpep Generic drug: Pancrelipase  (Lip-Prot-Amyl) Take 3,000 Units by mouth 3 (three) times daily.   doxycycline 100 MG tablet Commonly known as: VIBRA-TABS Take 1 tablet (100 mg total) by mouth every 12 (twelve) hours.   famotidine  20 MG tablet Commonly known as: PEPCID  Take 1 tablet (20 mg total) by mouth 2 (two) times daily.   feeding supplement (GLUCERNA SHAKE) Liqd Take 237 mLs by mouth in the morning, at noon, and at bedtime.   ferrous sulfate  325 (65 FE) MG tablet Take 325 mg by mouth daily with breakfast.   HumaLOG KwikPen 100 UNIT/ML KwikPen Generic drug: insulin  lispro Inject 0-10 Units into the skin See admin instructions. Inject 0-10 units into the skin three times a day, per sliding scale: BGL 70-140 = give nothing; 141-170 = 2 units; 201-230 = 3 units;  231-260 = 4 units; 261-290 = 5 units; 291-320 = 6 units; 321-350 = 7 units; 351-380 = 8 units; 381-400 = 10 units; <70 OR >400 = CALL MD   insulin  glargine 100 UNIT/ML injection Commonly known as: LANTUS  Inject 14 Units into the skin in the morning.   levETIRAcetam  500 MG tablet Commonly known as: KEPPRA  Take 500 mg by mouth 2 (two) times daily.   melatonin 3 MG Tabs tablet Take 3 mg by mouth at bedtime.   metFORMIN  500 MG tablet Commonly known as: GLUCOPHAGE  Take 500 mg by mouth 2 (two) times daily.   midodrine  10 MG tablet Commonly known as: PROAMATINE  Take 1 tablet (10 mg total) by mouth 3 (three) times daily with meals. Please hold for systolic blood pressure more than  110. What changed:  when to take this additional instructions   mirtazapine 7.5 MG tablet Commonly known as: REMERON Take 7.5 mg by mouth at bedtime.   multivitamin tablet Take 1 tablet by mouth daily with breakfast.   mupirocin  ointment 2 % Commonly known as: BACTROBAN  Apply 1 Application topically daily.   ondansetron  4 MG tablet Commonly known as: ZOFRAN  Take 4 mg by mouth every 6 (six) hours as needed for nausea or vomiting.   oxyCODONE  5 MG immediate release tablet Commonly known as: Oxy IR/ROXICODONE  Take 1 tablet (5 mg total) by mouth every 6 (six) hours as needed for severe pain (pain score 7-10). What changed:  when to take this reasons to take this additional instructions   polyethylene glycol 17 g packet Commonly known as: MIRALAX  / GLYCOLAX  Take 17 g by mouth daily as needed. What changed: reasons to take this   rosuvastatin  5 MG tablet Commonly known as: CRESTOR  Take 5 mg by mouth at bedtime.   senna 8.6 MG Tabs tablet Commonly known as: SENOKOT Take 2 tablets by mouth in the morning and at bedtime.   sertraline  100 MG tablet Commonly known as: ZOLOFT  Take 100 mg by mouth daily.   tamsulosin  0.4 MG Caps capsule Commonly known as: FLOMAX  Take 1 capsule (0.4 mg total) by mouth daily after supper. What changed: when to take this   zinc sulfate (50mg  elemental zinc) 220 (50 Zn) MG capsule Take 1 capsule (220 mg total) by mouth daily for 12 days. Start taking on: July 08, 2024               Discharge Care Instructions  (From admission, onward)           Start     Ordered   07/07/24 0000  Discharge wound care:       Comments: Daily: 1. Cleanse L lateral foot wound with Vashe wound cleanser do not rinse and allow to air dry. Apply Vashe moistened gauze to wound bed daily and secure with ABD pad and Kerlix roll gauze.   2. Cleanse L heel wound with Vashe, apply Xeroform gauze and secure with silicone foam.  Place L foot in Prevalon boot  to offload pressure.   07/07/24 1005              Time coordinating discharge: 35 min  Signed:  Harlene RAYMOND Bowl DO  [facility] 07/07/2024, 12:45 PM

## 2024-07-07 NOTE — Progress Notes (Signed)
 Pt discharged to [facility] with all belongings, paperwork, DNR form, and printed prescription

## 2024-07-07 NOTE — Progress Notes (Signed)
 Report called  to [facility] [phone] , spoke with nurse [place] who will be taking care of patient in room 206B. Nurse was familiar with patient and verbalized understanding of report and had no additional questions

## 2024-07-07 NOTE — Progress Notes (Signed)
 Palliative Medicine Inpatient Follow Up Note   HPI:  58 y.o. male  with past medical history of CVA, left hemiparesis, wheelchair and bedbound, 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 in Apr 2025 treated medically given baseline functional status, anxiety, seizures, DM, CAD last PCI >5 years, HTN, chronic hypotension on midodrine , hx chronic pancreatitis  admitted on 06/23/2024 with worsening left foot wound. Imaging confirms left fifth metatarsal osteomyelitis.    Worth to note, this patient is being followed by Vascular surgery, they recommended left above-knee amputation.   PMT has been consulted to assist with goals of care conversation. Patient/Family face treatment option decisions, advanced directive decisions and anticipatory care needs.    Patient's sister face treatment option decision, advance directive decisions and anticipatory care needs.   Patient transitioned from full code to DNR-limited on 07/05/2024.    Today's Discussion 07/07/2024  *Please note that this is a verbal dictation therefore any spelling or grammatical errors are due to the Dragon Medical One system interpretation.  Chart reviewed inclusive of vital signs, progress notes, laboratory results, and diagnostic images.   Patient was visited at bedside; no family members were present during the encounter. She was alert, able to express her needs, and engaged in brief conversation. She reported ongoing generalized pain and expressed feeling hungry. No signs of acute distress were observed.  Goals of care remain unchanged, to continue treating conditions that are medically manageable. According to the most recent communication between the Palliative Medicine Team (PMT) and the patient's sister, the family is still struggling with the decision to transition to comfort-focused/hospice care, as recommended by the medical team.  The current plan is for the patient to be discharged back to  her prior nursing facility with outpatient palliative care services in place.  An attempt was made to contact [name] by phone today to continue the goals-of-care discussion. A voicemail was left requesting a return call. The bedside RN was also advised to notify me via chat if the patient's sister arrives for a visit.    Palliative Support Provided.   Objective Assessment: Vital Signs Vitals:   07/07/24 0610 07/07/24 0958  BP: (!) 140/90 107/72  Pulse: 74 (!) 103  Resp: 18 16  Temp: 97.9 F (36.6 C) 98.2 F (36.8 C)  SpO2: 98% 98%    Intake/Output Summary (Last 24 hours) at 07/07/2024 1425 Last data filed at 07/07/2024 0610 Gross per 24 hour  Intake 650 ml  Output 2555 ml  Net -1905 ml   Last Weight  Most recent update: 07/07/2024  6:14 AM    Weight  60 kg (132 lb 4.4 oz)             Physical Exam Vitals reviewed.   Constitutional:      General: He is sleeping. He is not in acute distress.    Appearance: He is cachectic. He is ill-appearing.     Comments: All extremities contractured  HENT:     Head: Normocephalic and atraumatic.  Cardiovascular:     Rate and Rhythm: Normal rate.  Pulmonary:     Effort: Pulmonary effort is normal.  Skin:    Comments: Multiple meliplex over body   SUMMARY OF RECOMMENDATIONS    Recommendations/Plan:  Code Status: Maintain DNR-Limited Sister of patient is considering a transition to comfort focused measures, but very undecided, needs continued ongoing GOC conversation Discharge to prior SNF facility [facility]) with outpatient palliative.  Continue to provide psycho-social and emotional support  to patient and family Palliative medicine team will continue to follow.   Symptom Management: Per Primary team  Time Spent: 35 minutes  Detailed review of medical records (labs, imaging, vital signs), medically appropriate exam, discussed with treatment team, counseling and education to patient, family, & staff,  documenting clinical information, coordination of care.   ______________________________________________________________________________________ Kathlyne Bolder NP-C [facility] Palliative Medicine Team Team Cell Phone: [phone] Please utilize secure chat with additional questions, if there is no response within 30 minutes please call the above phone number  Palliative Medicine Team providers are available by phone from 7am to 7pm daily and can be reached through the team cell phone.  Should this patient require assistance outside of these hours, please call the patient's attending physician.

## 2024-09-08 ENCOUNTER — Emergency Department (HOSPITAL_COMMUNITY)

## 2024-09-08 ENCOUNTER — Inpatient Hospital Stay (HOSPITAL_COMMUNITY)
Admission: EM | Admit: 2024-09-08 | Discharge: 2024-09-10 | DRG: 871 | Disposition: A | Discharge status: Hospice | Source: Skilled Nursing Facility | Attending: Internal Medicine | Admitting: Internal Medicine

## 2024-09-08 ENCOUNTER — Other Ambulatory Visit: Payer: Self-pay

## 2024-09-08 DIAGNOSIS — Z1152 Encounter for screening for COVID-19: Secondary | ICD-10-CM | POA: Diagnosis not present

## 2024-09-08 DIAGNOSIS — I2510 Atherosclerotic heart disease of native coronary artery without angina pectoris: Secondary | ICD-10-CM | POA: Diagnosis not present

## 2024-09-08 DIAGNOSIS — F0390 Unspecified dementia without behavioral disturbance: Secondary | ICD-10-CM | POA: Diagnosis present

## 2024-09-08 DIAGNOSIS — I701 Atherosclerosis of renal artery: Secondary | ICD-10-CM | POA: Diagnosis present

## 2024-09-08 DIAGNOSIS — Z8673 Personal history of transient ischemic attack (TIA), and cerebral infarction without residual deficits: Secondary | ICD-10-CM

## 2024-09-08 DIAGNOSIS — Z66 Do not resuscitate: Secondary | ICD-10-CM | POA: Diagnosis present

## 2024-09-08 DIAGNOSIS — Z811 Family history of alcohol abuse and dependence: Secondary | ICD-10-CM

## 2024-09-08 DIAGNOSIS — E1165 Type 2 diabetes mellitus with hyperglycemia: Secondary | ICD-10-CM | POA: Diagnosis present

## 2024-09-08 DIAGNOSIS — M869 Osteomyelitis, unspecified: Secondary | ICD-10-CM | POA: Diagnosis present

## 2024-09-08 DIAGNOSIS — E785 Hyperlipidemia, unspecified: Secondary | ICD-10-CM | POA: Diagnosis present

## 2024-09-08 DIAGNOSIS — F17210 Nicotine dependence, cigarettes, uncomplicated: Secondary | ICD-10-CM | POA: Diagnosis present

## 2024-09-08 DIAGNOSIS — R6521 Severe sepsis with septic shock: Secondary | ICD-10-CM | POA: Diagnosis present

## 2024-09-08 DIAGNOSIS — E1169 Type 2 diabetes mellitus with other specified complication: Secondary | ICD-10-CM | POA: Diagnosis present

## 2024-09-08 DIAGNOSIS — A419 Sepsis, unspecified organism: Secondary | ICD-10-CM | POA: Diagnosis present

## 2024-09-08 DIAGNOSIS — Z79899 Other long term (current) drug therapy: Secondary | ICD-10-CM

## 2024-09-08 DIAGNOSIS — E11621 Type 2 diabetes mellitus with foot ulcer: Secondary | ICD-10-CM | POA: Diagnosis present

## 2024-09-08 DIAGNOSIS — L97526 Non-pressure chronic ulcer of other part of left foot with bone involvement without evidence of necrosis: Secondary | ICD-10-CM | POA: Diagnosis present

## 2024-09-08 DIAGNOSIS — Z8249 Family history of ischemic heart disease and other diseases of the circulatory system: Secondary | ICD-10-CM

## 2024-09-08 DIAGNOSIS — M84675A Pathological fracture in other disease, left foot, initial encounter for fracture: Secondary | ICD-10-CM | POA: Diagnosis present

## 2024-09-08 DIAGNOSIS — G40909 Epilepsy, unspecified, not intractable, without status epilepticus: Secondary | ICD-10-CM | POA: Diagnosis present

## 2024-09-08 DIAGNOSIS — F1020 Alcohol dependence, uncomplicated: Secondary | ICD-10-CM | POA: Diagnosis present

## 2024-09-08 DIAGNOSIS — G9341 Metabolic encephalopathy: Secondary | ICD-10-CM

## 2024-09-08 DIAGNOSIS — E43 Unspecified severe protein-calorie malnutrition: Secondary | ICD-10-CM | POA: Diagnosis present

## 2024-09-08 DIAGNOSIS — R5381 Other malaise: Secondary | ICD-10-CM | POA: Diagnosis present

## 2024-09-08 DIAGNOSIS — M62461 Contracture of muscle, right lower leg: Secondary | ICD-10-CM | POA: Diagnosis present

## 2024-09-08 DIAGNOSIS — Z7189 Other specified counseling: Secondary | ICD-10-CM

## 2024-09-08 DIAGNOSIS — K219 Gastro-esophageal reflux disease without esophagitis: Secondary | ICD-10-CM | POA: Diagnosis present

## 2024-09-08 DIAGNOSIS — E1151 Type 2 diabetes mellitus with diabetic peripheral angiopathy without gangrene: Secondary | ICD-10-CM | POA: Diagnosis present

## 2024-09-08 DIAGNOSIS — M62462 Contracture of muscle, left lower leg: Secondary | ICD-10-CM | POA: Diagnosis present

## 2024-09-08 DIAGNOSIS — D649 Anemia, unspecified: Secondary | ICD-10-CM | POA: Diagnosis present

## 2024-09-08 DIAGNOSIS — Z823 Family history of stroke: Secondary | ICD-10-CM

## 2024-09-08 DIAGNOSIS — K860 Alcohol-induced chronic pancreatitis: Secondary | ICD-10-CM | POA: Diagnosis present

## 2024-09-08 DIAGNOSIS — I69354 Hemiplegia and hemiparesis following cerebral infarction affecting left non-dominant side: Secondary | ICD-10-CM | POA: Diagnosis not present

## 2024-09-08 DIAGNOSIS — E872 Acidosis, unspecified: Secondary | ICD-10-CM | POA: Diagnosis present

## 2024-09-08 DIAGNOSIS — Z833 Family history of diabetes mellitus: Secondary | ICD-10-CM

## 2024-09-08 DIAGNOSIS — Z515 Encounter for palliative care: Secondary | ICD-10-CM

## 2024-09-08 DIAGNOSIS — F1010 Alcohol abuse, uncomplicated: Secondary | ICD-10-CM

## 2024-09-08 DIAGNOSIS — Z681 Body mass index (BMI) 19 or less, adult: Secondary | ICD-10-CM

## 2024-09-08 DIAGNOSIS — I10 Essential (primary) hypertension: Secondary | ICD-10-CM | POA: Diagnosis present

## 2024-09-08 DIAGNOSIS — Z794 Long term (current) use of insulin: Secondary | ICD-10-CM

## 2024-09-08 DIAGNOSIS — Z7902 Long term (current) use of antithrombotics/antiplatelets: Secondary | ICD-10-CM

## 2024-09-08 DIAGNOSIS — Z87898 Personal history of other specified conditions: Secondary | ICD-10-CM | POA: Diagnosis not present

## 2024-09-08 DIAGNOSIS — Z8419 Family history of other disorders of kidney and ureter: Secondary | ICD-10-CM

## 2024-09-08 DIAGNOSIS — M79672 Pain in left foot: Secondary | ICD-10-CM | POA: Diagnosis present

## 2024-09-08 DIAGNOSIS — Z7401 Bed confinement status: Secondary | ICD-10-CM

## 2024-09-08 DIAGNOSIS — Z7984 Long term (current) use of oral hypoglycemic drugs: Secondary | ICD-10-CM

## 2024-09-08 DIAGNOSIS — Z5329 Procedure and treatment not carried out because of patient's decision for other reasons: Secondary | ICD-10-CM | POA: Diagnosis present

## 2024-09-08 LAB — I-STAT CG4 LACTIC ACID, ED
Lactic Acid, Venous: 2.3 mmol/L (ref 0.5–1.9)
Lactic Acid, Venous: 2.5 mmol/L (ref 0.5–1.9)
Lactic Acid, Venous: 3.1 mmol/L (ref 0.5–1.9)

## 2024-09-08 LAB — CBC WITH DIFFERENTIAL/PLATELET
Abs Immature Granulocytes: 0.24 K/uL — ABNORMAL HIGH (ref 0.00–0.07)
Basophils Absolute: 0 K/uL (ref 0.0–0.1)
Basophils Relative: 0 %
Eosinophils Absolute: 0 K/uL (ref 0.0–0.5)
Eosinophils Relative: 0 %
HCT: 25.3 % — ABNORMAL LOW (ref 39.0–52.0)
Hemoglobin: 7.5 g/dL — ABNORMAL LOW (ref 13.0–17.0)
Immature Granulocytes: 1 %
Lymphocytes Relative: 7 %
Lymphs Abs: 1.5 K/uL (ref 0.7–4.0)
MCH: 26.8 pg (ref 26.0–34.0)
MCHC: 29.6 g/dL — ABNORMAL LOW (ref 30.0–36.0)
MCV: 90.4 fL (ref 80.0–100.0)
Monocytes Absolute: 1 K/uL (ref 0.1–1.0)
Monocytes Relative: 5 %
Neutro Abs: 18.5 K/uL — ABNORMAL HIGH (ref 1.7–7.7)
Neutrophils Relative %: 87 %
Platelets: 481 K/uL — ABNORMAL HIGH (ref 150–400)
RBC: 2.8 MIL/uL — ABNORMAL LOW (ref 4.22–5.81)
RDW: 17.3 % — ABNORMAL HIGH (ref 11.5–15.5)
WBC: 21.3 K/uL — ABNORMAL HIGH (ref 4.0–10.5)
nRBC: 0 % (ref 0.0–0.2)

## 2024-09-08 LAB — RESP PANEL BY RT-PCR (RSV, FLU A&B, COVID)  RVPGX2
Influenza A by PCR: NEGATIVE
Influenza B by PCR: NEGATIVE
Resp Syncytial Virus by PCR: NEGATIVE
SARS Coronavirus 2 by RT PCR: NEGATIVE

## 2024-09-08 LAB — COMPREHENSIVE METABOLIC PANEL WITH GFR
ALT: 29 U/L (ref 0–44)
AST: 58 U/L — ABNORMAL HIGH (ref 15–41)
Albumin: 2.6 g/dL — ABNORMAL LOW (ref 3.5–5.0)
Alkaline Phosphatase: 202 U/L — ABNORMAL HIGH (ref 38–126)
Anion gap: 13 (ref 5–15)
BUN: 14 mg/dL (ref 6–20)
CO2: 26 mmol/L (ref 22–32)
Calcium: 9.2 mg/dL (ref 8.9–10.3)
Chloride: 103 mmol/L (ref 98–111)
Creatinine, Ser: 0.59 mg/dL — ABNORMAL LOW (ref 0.61–1.24)
GFR, Estimated: >60 mL/min (ref >60)
Glucose, Bld: 152 mg/dL — ABNORMAL HIGH (ref 70–99)
Potassium: 3.6 mmol/L (ref 3.5–5.1)
Sodium: 141 mmol/L (ref 135–145)
Total Bilirubin: <0.2 mg/dL (ref 0.0–1.2)
Total Protein: 7.4 g/dL (ref 6.5–8.1)

## 2024-09-08 LAB — PROTIME-INR
INR: 1.2 (ref 0.8–1.2)
Prothrombin Time: 16 s — ABNORMAL HIGH (ref 11.4–15.2)

## 2024-09-08 MED ORDER — INSULIN GLARGINE-YFGN 100 UNIT/ML ~~LOC~~ SOLN
15.0000 [IU] | Freq: Every day | SUBCUTANEOUS | Status: DC
  Administered 2024-09-09: 10:00:00 15 [IU] via SUBCUTANEOUS
  Filled 2024-09-08: qty 0.15

## 2024-09-08 MED ORDER — MIRTAZAPINE 15 MG PO TABS
15.0000 mg | ORAL_TABLET | Freq: Every day | ORAL | Status: DC
  Administered 2024-09-09: 15 mg via ORAL
  Filled 2024-09-08: qty 1

## 2024-09-08 MED ORDER — CLOPIDOGREL BISULFATE 75 MG PO TABS
75.0000 mg | ORAL_TABLET | Freq: Every day | ORAL | Status: DC
  Administered 2024-09-09: 10:00:00 75 mg via ORAL
  Filled 2024-09-08: qty 1

## 2024-09-08 MED ORDER — VANCOMYCIN HCL 750 MG/150ML IV SOLN
750.0000 mg | Freq: Two times a day (BID) | INTRAVENOUS | Status: DC
  Administered 2024-09-09: 06:00:00 750 mg via INTRAVENOUS
  Filled 2024-09-08 (×2): qty 150

## 2024-09-08 MED ORDER — SODIUM CHLORIDE 0.9 % IV SOLN
3.0000 g | Freq: Four times a day (QID) | INTRAVENOUS | Status: DC
  Administered 2024-09-08 – 2024-09-09 (×3): 3 g via INTRAVENOUS
  Filled 2024-09-08 (×4): qty 8

## 2024-09-08 MED ORDER — LEVETIRACETAM 500 MG PO TABS
500.0000 mg | ORAL_TABLET | Freq: Two times a day (BID) | ORAL | Status: DC
  Administered 2024-09-09 – 2024-09-10 (×4): 500 mg via ORAL
  Filled 2024-09-08 (×4): qty 1

## 2024-09-08 MED ORDER — LACTATED RINGERS IV BOLUS (SEPSIS)
1000.0000 mL | Freq: Once | INTRAVENOUS | Status: AC
  Administered 2024-09-08: 16:00:00 1000 mL via INTRAVENOUS

## 2024-09-08 MED ORDER — PANCRELIPASE (LIP-PROT-AMYL) 3000-9500 UNITS PO CPEP
3000.0000 [IU] | ORAL_CAPSULE | Freq: Three times a day (TID) | ORAL | Status: DC
  Filled 2024-09-08: qty 1

## 2024-09-08 MED ORDER — VANCOMYCIN HCL IN DEXTROSE 1-5 GM/200ML-% IV SOLN
1000.0000 mg | Freq: Once | INTRAVENOUS | Status: AC
  Administered 2024-09-08: 15:00:00 1000 mg via INTRAVENOUS
  Filled 2024-09-08: qty 200

## 2024-09-08 MED ORDER — HYDROCODONE-ACETAMINOPHEN 5-325 MG PO TABS
1.0000 | ORAL_TABLET | ORAL | Status: DC | PRN
  Administered 2024-09-09: 11:00:00 2 via ORAL
  Filled 2024-09-08: qty 2

## 2024-09-08 MED ORDER — LACTATED RINGERS IV SOLN: INTRAVENOUS | Status: DC

## 2024-09-08 MED ORDER — COLLAGENASE 250 UNIT/GM EX OINT
TOPICAL_OINTMENT | Freq: Every day | CUTANEOUS | Status: DC
  Filled 2024-09-08: qty 30

## 2024-09-08 MED ORDER — MIDODRINE HCL 5 MG PO TABS
10.0000 mg | ORAL_TABLET | Freq: Three times a day (TID) | ORAL | Status: DC
  Administered 2024-09-09: 11:00:00 10 mg via ORAL
  Filled 2024-09-08: qty 2

## 2024-09-08 MED ORDER — HYDROMORPHONE HCL 1 MG/ML IJ SOLN
0.5000 mg | INTRAMUSCULAR | Status: DC | PRN
  Administered 2024-09-09: 05:00:00 1 mg via INTRAVENOUS
  Administered 2024-09-09: 03:00:00 0.5 mg via INTRAVENOUS
  Filled 2024-09-08 (×2): qty 1

## 2024-09-08 MED ORDER — MEDIHONEY WOUND/BURN DRESSING EX PSTE
1.0000 | PASTE | Freq: Every day | CUTANEOUS | Status: DC
  Filled 2024-09-08: qty 44

## 2024-09-08 MED ORDER — ONDANSETRON HCL 4 MG PO TABS: 4.0000 mg | ORAL_TABLET | Freq: Four times a day (QID) | ORAL | Status: DC | PRN

## 2024-09-08 MED ORDER — SERTRALINE HCL 50 MG PO TABS
150.0000 mg | ORAL_TABLET | Freq: Every day | ORAL | Status: DC
  Administered 2024-09-09: 10:00:00 150 mg via ORAL
  Filled 2024-09-08: qty 1

## 2024-09-08 MED ORDER — ACETAMINOPHEN 325 MG PO TABS: 650.0000 mg | ORAL_TABLET | Freq: Four times a day (QID) | ORAL | Status: DC | PRN

## 2024-09-08 MED ORDER — SODIUM CHLORIDE 0.9 % IV SOLN
3.0000 g | Freq: Once | INTRAVENOUS | Status: AC
  Administered 2024-09-08: 15:00:00 3 g via INTRAVENOUS
  Filled 2024-09-08: qty 8

## 2024-09-08 MED ORDER — BUSPIRONE HCL 5 MG PO TABS
10.0000 mg | ORAL_TABLET | Freq: Two times a day (BID) | ORAL | Status: DC
  Administered 2024-09-09 (×2): 10 mg via ORAL
  Filled 2024-09-08 (×2): qty 2

## 2024-09-08 MED ORDER — ACETAMINOPHEN 650 MG RE SUPP: 650.0000 mg | Freq: Four times a day (QID) | RECTAL | Status: DC | PRN

## 2024-09-08 MED ORDER — ONDANSETRON HCL 4 MG/2ML IJ SOLN: 4.0000 mg | Freq: Four times a day (QID) | INTRAMUSCULAR | Status: DC | PRN

## 2024-09-08 MED ORDER — OXYCODONE HCL 5 MG PO TABS
5.0000 mg | ORAL_TABLET | Freq: Four times a day (QID) | ORAL | Status: DC | PRN
  Administered 2024-09-09: 02:00:00 5 mg via ORAL
  Filled 2024-09-08: qty 1

## 2024-09-08 NOTE — Assessment & Plan Note (Signed)
-   Order Sensitive SSI   - continue home insulin but decreased to  15 units,  -  check TSH and HgA1C  - Hold by mouth medications

## 2024-09-08 NOTE — ED Provider Notes (Signed)
 I provided a substantive portion of the care of this patient.  I personally made/approved the management plan for this patient and take responsibility for the patient management.       58 year old patient here due to infection of the foot.  Patient refuses amputation at this time.  Patient has obvious severe infection.  Do not think that he has necrotizing fasciitis at this time.  No extension up into the patient's thigh at this time.  Patient is a DNR.  Will start antibiotics and admit   Dasie Faden, MD 09/08/24 1534

## 2024-09-08 NOTE — Sepsis Progress Note (Signed)
 eLink is following this Code Sepsis.

## 2024-09-08 NOTE — Assessment & Plan Note (Signed)
 In remission.

## 2024-09-08 NOTE — Assessment & Plan Note (Signed)
-  SIRS criteria met with  elevated white blood cell count,       Component Value Date/Time   WBC 21.3 (H) 09/08/2024 1436   LYMPHSABS 1.5 09/08/2024 1436     tachycardia   ,  Today's Vitals   09/08/24 1800 09/08/24 1853 09/08/24 2130 09/08/24 2200  BP: 113/84  102/76 106/71  Pulse: 97   92  Resp: 20  13 12   Temp:  99.2 F (37.3 C)    TempSrc:      SpO2: 98%   98%         -Most likely source being: Osteomyelitis   Patient meeting criteria for Severe sepsis with    evidence of end organ damage/organ dysfunction such as   elevated lactic acid >2     Component Value Date/Time   LATICACIDVEN 3.1 (HH) 09/08/2024 1945     SBP<90 mmhg or MAP < 65 mmhg,    Patient is meeting criteria for SEPTIC SHOCK with  lactic acid > 4 or multiple SBP readings below 90 mmhg or MAP reading below 65 mmhg   - Obtain serial lactic acid and procalcitonin level.  - Initiated IV antibiotics in ER: Antibiotics Given (last 72 hours)     Date/Time Action Medication Dose Rate   09/08/24 1519 New Bag/Given   vancomycin  (VANCOCIN ) IVPB 1000 mg/200 mL premix 1,000 mg 200 mL/hr   09/08/24 1521 New Bag/Given   Ampicillin -Sulbactam (UNASYN ) 3 g in sodium chloride  0.9 % 100 mL IVPB 3 g 200 mL/hr         - await results of blood and urine culture   Intravenous fluids were administered,     Patient is in Septic shock with  (SBP < 90 mmhg/MAP < 65 mmhg  ). Fluid resuscitation however was limited because  (patient is enrolled in Hospice with comfort care status OR of shared decision making with patient/ guardian, who prefer limited resuscitation with fluid  10:20 PM

## 2024-09-08 NOTE — Progress Notes (Signed)
 Pharmacy Antibiotic Note  Brandon [name] is a 58 y.o. male admitted on 09/08/2024 with sepsis.  Pt known to have L 5th metatarsal osteomyelitis.   Pharmacy has been consulted for Unasyn  & Vancomycin  dosing.  Used weight from 07/07/24 of 60kg & rounded Scr up to 0.8 for estimated CrCl ~87ml/min.  This could still be over-estimated due to low muscle mass.   Plan: Unasyn  3gm IV q6h Vancomycin  750mg  IV q12h for estimated AUC 472 (goal 400-550) Monitor renal function and cx data      Temp (24hrs), Avg:99 F (37.2 C), Min:98.8 F (37.1 C), Max:99.2 F (37.3 C)  Recent Labs  Lab 09/08/24 1436 09/08/24 1444 09/08/24 1641 09/08/24 1945  WBC 21.3*  --   --   --   CREATININE 0.59*  --   --   --   LATICACIDVEN  --  2.3* 2.5* 3.1*    CrCl cannot be calculated (Unknown ideal weight.).    Allergies[1]  Antimicrobials this admission: 12/17 Vanc >>  12/17 Unasyn  >>   Dose adjustments this admission:  Microbiology results: 12/17 BCx:  12/17 Resp PCR: negative  Thank you for allowing pharmacy to be a part of this patients care.  Rosaline Millet PharmD 09/08/2024 10:19 PM     [1] No Known Allergies

## 2024-09-08 NOTE — Assessment & Plan Note (Signed)
 With severe left hemiparesis bedbound Continue Plavix  75 mg daily

## 2024-09-08 NOTE — Assessment & Plan Note (Signed)
 Allow permissive hypertension

## 2024-09-08 NOTE — Assessment & Plan Note (Addendum)
 Severe osteomyelitis of left lower extremity Discussed at length with patient and family at this point they would like patient to be comfort care although okay to continue with antibiotics and gentle hydration Continue Unasyn  and vancomycin  for now Vascular consult has been called at this point patient declining amputation Palliative care consult placed Pain management as needed

## 2024-09-08 NOTE — ED Provider Notes (Signed)
 [place] EMERGENCY DEPARTMENT AT [facility] Provider Note   CSN: [id number] Arrival date & time: 09/08/24  1349     Patient presents with: No chief complaint on file.   Brandon [name] is a 58 y.o. male.   The history is provided by the patient, the EMS personnel and medical records. No language interpreter was used.     58 year old male history of prior stroke, diabetes, alcohol  abuse, CAD, hypertension brought here via EMS with concerns of for an infection.  Patient was brought here from [name].  Per note, patient has infection involving his left foot with recommendations of foot amputation but patient refused. This was when he was admitted in October of this year.  For the past week patient is increasing pain to his foot.  I was unable to obtain much history from him however the facility provider believes that patient may be septic.  Was reported that he has a soft blood pressure of 70 systolic this morning and EMS was contacted.  Patient did receive 600 mL of lactic ringer prior to arrival.  History is limited from patient.  He reportedly had an elevated CBG of 310.  Known history of diabetes.  Prior to Admission medications  Medication Sig Start Date End Date Taking? Authorizing Provider  acetaminophen  (TYLENOL ) 325 MG tablet Take 650 mg by mouth every 6 (six) hours as needed for mild pain (pain score 1-3) (cannot exceed a total of 3,000 mg/24 hours).    [provider]  amoxicillin -clavulanate (AUGMENTIN ) 875-125 MG tablet Take 1 tablet by mouth every 12 (twelve) hours. 07/07/24   Vann, Jessica U, DO  ascorbic acid  (VITAMIN C ) 500 MG tablet Take 500 mg by mouth daily. 11/26/21   [provider]  baclofen  (LIORESAL ) 20 MG tablet Take 1 tablet (20 mg total) by mouth 3 (three) times daily. 07/07/24   Vann, Jessica U, DO  busPIRone  (BUSPAR ) 10 MG tablet Take 1 tablet (10 mg total) by mouth 2 (two) times daily. 12/23/20   Gherghe, Costin  M, MD  clopidogrel  (PLAVIX ) 75 MG tablet Take 1 tablet (75 mg total) by mouth daily. 08/14/23   Elnora Ip, MD  doxycycline  (VIBRA -TABS) 100 MG tablet Take 1 tablet (100 mg total) by mouth every 12 (twelve) hours. 07/07/24   Vann, Jessica U, DO  famotidine  (PEPCID ) 20 MG tablet Take 1 tablet (20 mg total) by mouth 2 (two) times daily. 12/23/20   Gherghe, Costin M, MD  feeding supplement, GLUCERNA SHAKE, (GLUCERNA SHAKE) LIQD Take 237 mLs by mouth in the morning, at noon, and at bedtime.    [provider]  ferrous sulfate  325 (65 FE) MG tablet Take 325 mg by mouth daily with breakfast.    [provider]  insulin  glargine (LANTUS ) 100 UNIT/ML injection Inject 14 Units into the skin in the morning.    [provider]  insulin  lispro (HUMALOG KWIKPEN) 100 UNIT/ML KwikPen Inject 0-10 Units into the skin See admin instructions. Inject 0-10 units into the skin three times a day, per sliding scale: BGL 70-140 = give nothing; 141-170 = 2 units; 201-230 = 3 units; 231-260 = 4 units; 261-290 = 5 units; 291-320 = 6 units; 321-350 = 7 units; 351-380 = 8 units; 381-400 = 10 units; <70 OR >400 = CALL MD    [provider]  levETIRAcetam  (KEPPRA ) 500 MG tablet Take 500 mg by mouth 2 (two) times daily.    [provider]  melatonin 3  MG TABS tablet Take 3 mg by mouth at bedtime.    [provider]  metFORMIN  (GLUCOPHAGE ) 500 MG tablet Take 500 mg by mouth 2 (two) times daily.    [provider]  midodrine  (PROAMATINE ) 10 MG tablet Take 1 tablet (10 mg total) by mouth 3 (three) times daily with meals. Please hold for systolic blood pressure more than 110. Patient taking differently: Take 10 mg by mouth See admin instructions. Take 10 mg by mouth three times a day and hold for a Systolic number more than 110 5/69/74   Elgergawy, Brayton RAMAN, MD  mirtazapine  (REMERON ) 7.5 MG tablet Take 7.5 mg by mouth at bedtime.    [provider]  Multiple  Vitamin (MULTIVITAMIN) tablet Take 1 tablet by mouth daily with breakfast.    [provider]  mupirocin  ointment (BACTROBAN ) 2 % Apply 1 Application topically daily. Patient not taking: Reported on 06/23/2024 03/31/24   [name], MD  ondansetron  (ZOFRAN ) 4 MG tablet Take 4 mg by mouth every 6 (six) hours as needed for nausea or vomiting.    [provider]  oxyCODONE  (OXY IR/ROXICODONE ) 5 MG immediate release tablet Take 1 tablet (5 mg total) by mouth every 6 (six) hours as needed for severe pain (pain score 7-10). 07/07/24   Vann, Jessica U, DO  Pancrelipase , Lip-Prot-Amyl, (CREON ) 3000-9500 units CPEP Take 3,000 Units by mouth 3 (three) times daily.    [provider]  polyethylene glycol (MIRALAX  / GLYCOLAX ) 17 g packet Take 17 g by mouth daily as needed. Patient taking differently: Take 17 g by mouth daily as needed for mild constipation. 01/20/24   Dennise Lavada POUR, MD  rosuvastatin  (CRESTOR ) 5 MG tablet Take 5 mg by mouth at bedtime.    [provider]  senna (SENOKOT) 8.6 MG TABS tablet Take 2 tablets by mouth in the morning and at bedtime.    [provider]  sertraline  (ZOLOFT ) 100 MG tablet Take 100 mg by mouth daily.    [provider]  tamsulosin  (FLOMAX ) 0.4 MG CAPS capsule Take 1 capsule (0.4 mg total) by mouth daily after supper. Patient taking differently: Take 0.4 mg by mouth at bedtime. 08/01/23   [name], , MD    Allergies: Patient has no known allergies.    Review of Systems  All other systems reviewed and are negative.   Updated Vital Signs BP (!) 83/52   Pulse 72   Temp 98.8 F (37.1 C) (Rectal)   Resp 15   SpO2 100%   Physical Exam Constitutional:      General: He is not in acute distress.    Appearance: He is well-developed. He is ill-appearing.     Comments: Chronically ill-appearing male laying in the fetal position, sleeping, easily arousable and appears to be in no acute discomfort.   HENT:     Head: Atraumatic.  Eyes:     Conjunctiva/sclera: Conjunctivae normal.  Cardiovascular:     Rate and Rhythm: Normal rate and regular rhythm.     Pulses: Normal pulses.     Heart sounds: Normal heart sounds.  Pulmonary:     Effort: Pulmonary effort is normal.     Breath sounds: Normal breath sounds.  Abdominal:     Palpations: Abdomen is soft.     Tenderness: There is no abdominal tenderness.  Musculoskeletal:        General: Tenderness (Left foot: Significant infected open ulceration involving the Achilles tendon as well as pressure ulcers to the  dorsum of the foot extended to mid tibfib as depicted in picture below) present.     Cervical back: Normal range of motion and neck supple.  Skin:    Findings: No rash.  Neurological:     Mental Status: He is alert. He is disoriented.     Comments: Appears to be in a contracted position, difficulty laying flat      (all labs ordered are listed, but only abnormal results are displayed) Labs Reviewed  COMPREHENSIVE METABOLIC PANEL WITH GFR - Abnormal; Notable for the following components:      Result Value   Glucose, Bld 152 (*)    Creatinine, Ser 0.59 (*)    Albumin  2.6 (*)    AST 58 (*)    Alkaline Phosphatase 202 (*)    All other components within normal limits  CBC WITH DIFFERENTIAL/PLATELET - Abnormal; Notable for the following components:   WBC 21.3 (*)    RBC 2.80 (*)    Hemoglobin 7.5 (*)    HCT 25.3 (*)    MCHC 29.6 (*)    RDW 17.3 (*)    Platelets 481 (*)    Neutro Abs 18.5 (*)    Abs Immature Granulocytes 0.24 (*)    All other components within normal limits  PROTIME-INR - Abnormal; Notable for the following components:   Prothrombin Time 16.0 (*)    All other components within normal limits  I-STAT CG4 LACTIC ACID, ED - Abnormal; Notable for the following components:   Lactic Acid, Venous 2.3 (*)    All other components within normal limits  I-STAT CG4 LACTIC ACID, ED - Abnormal; Notable for the  following components:   Lactic Acid, Venous 2.5 (*)    All other components within normal limits  I-STAT CG4 LACTIC ACID, ED - Abnormal; Notable for the following components:   Lactic Acid, Venous 3.1 (*)    All other components within normal limits  RESP PANEL BY RT-PCR (RSV, FLU A&B, COVID)  RVPGX2  CULTURE, BLOOD (ROUTINE X 2)  CULTURE, BLOOD (ROUTINE X 2)  URINALYSIS, W/ REFLEX TO CULTURE (INFECTION SUSPECTED)  I-STAT CG4 LACTIC ACID, ED    EKG: None  Radiology: DG Foot 2 Views Left Result Date: 09/08/2024 CLINICAL DATA:  Infection. EXAM: LEFT FOOT - 2 VIEW COMPARISON:  Left foot radiograph dated 06/23/2024. FINDINGS: There is fracture of the base of the fourth metatarsal. Apparent erosive changes of the posterior calcaneus concerning for osteomyelitis. No other acute fracture. Evaluation for fracture however is limited due to advanced osteopenia. Ulceration of the skin of the dorsum of the foot and calcaneus. IMPRESSION: 1. Fracture of the base of the fourth metatarsal. 2. Findings concerning for osteomyelitis of the posterior calcaneus. MRI or a white blood cell nuclear scan may provide better evaluation. 3. Ulceration of the skin of the dorsum of the calcaneus. Electronically Signed   By: Vanetta Chou M.D.   On: 09/08/2024 15:27     .Critical Care  Performed by: Nivia Colon, PA-C Authorized by: Nivia Colon, PA-C   Critical care provider statement:    Critical care time (minutes):  45   Critical care was time spent personally by me on the following activities:  Development of treatment plan with patient or surrogate, discussions with consultants, evaluation of patient's response to treatment, examination of patient, ordering and review of laboratory studies, ordering and review of radiographic studies, ordering and performing treatments and interventions, pulse oximetry, re-evaluation of patient's condition and review of old charts  Medications Ordered in the ED  lactated  ringers  infusion ( Intravenous New Bag/Given 09/08/24 1532)  Ampicillin -Sulbactam (UNASYN ) 3 g in sodium chloride  0.9 % 100 mL IVPB (0 g Intravenous Stopped 09/08/24 1551)    And  vancomycin  (VANCOCIN ) IVPB 1000 mg/200 mL premix (0 mg Intravenous Stopped 09/08/24 1619)  lactated ringers  bolus 1,000 mL (1,000 mLs Intravenous New Bag/Given 09/08/24 1534)    And  lactated ringers  bolus 1,000 mL (1,000 mLs Intravenous New Bag/Given 09/08/24 1540)                                    Medical Decision Making Amount and/or Complexity of Data Reviewed Labs: ordered. Radiology: ordered.  Risk Prescription drug management. Decision regarding hospitalization.   BP 97/61 (BP Location: Left Arm)   Pulse 77   Temp 98.8 F (37.1 C) (Rectal)   Resp 15   SpO2 99%   52:52 PM  58 year old male history of prior stroke, diabetes, alcohol  abuse, CAD, hypertension brought here via EMS with concerns of for an infection.  Patient was brought here from [facility] living facility.  Per note, patient has infection involving his right foot with recommendations of foot amputation but patient refused.  For the past week patient is increasing pain to his foot.  I was unable to obtain much history from him however the facility provider believes that patient may be septic.  Was reported that he has a soft blood pressure of 70 systolic this morning and EMS was contacted.  Patient did receive 600 mL of lactic ringer prior to arrival.  History is limited from patient.  He reportedly had an elevated CBG of 310.  Known history of diabetes.  Exam notable for a left foot with multiple open ulceration that appears infected as depicted in picture included. Patient is laying in fetal position, sleeping but arousable and in no acute discomfort.  Skin is warm to the touch.  -Labs ordered, independently viewed and interpreted by me.  Labs remarkable for WBC 21.3, lactic acid 2.3.  Hgb 7.5 which is a slight drop from prior values  of 8.3 approximately 2 months ago.  -The patient was maintained on a cardiac monitor.  I personally viewed and interpreted the cardiac monitored which showed an underlying rhythm of: SR -Imaging independently viewed and interpreted by me and I agree with radiologist's interpretation.  Result remarkable for L foot xray showing finding concerning for osteomyelitis of the posterior calcaneous.   -This patient presents to the ED for concern of foot infection, this involves an extensive number of treatment options, and is a complaint that carries with it a high risk of complications and morbidity.  The differential diagnosis includes, osteomyelitis, cellulitis, diabetic foot ulcer, ischemic foot, fracture -Co morbidities that complicate the patient evaluation includes stroke, CAD, diabetes, hypertension -Treatment includes sepsis protocol including IV fluid, broad-spectrum antibiotic -Reevaluation of the patient after these medicines showed that the patient improved -PCP office notes or outside notes reviewed -Discussion with specialist [facility] Dr. Silvester who agrees to admit pt.  I have also consulted with vascular surgeon Dr. Serene who recommend admitting to [facility] if patient is amenable for above the knee amputation or pt can stay at [facility] if he just want palliative care.  I have discussed this with patient.  Patient is adamantly refused above the knee amputation.  He understand he will likely succumb to this infection and  lose his life.  He clearly express no desire of amputation and appears to make informed decision for just palliative care.  I have also reached out to pt's sister over the phone who also agrees with medical management.  -Escalation to admission/observation considered: patient agreeable with admission     Final diagnoses:  Sepsis, due to unspecified organism, unspecified whether acute organ dysfunction present [facility])  Osteomyelitis of left foot, unspecified type  [facility])    ED Discharge Orders     None          Nivia Colon, PA-C 09/08/24 2018    Dasie Faden, MD 09/09/24 [phone]

## 2024-09-08 NOTE — ED Triage Notes (Signed)
 Arrived by EMS from [facility], patient is at baseline. Patient has infection of right foot, it was suggested that the foot be amputated, but the patient refused. Facility doctor believes patient is septic. Patient received 600 mL LR en route; initial BP was 70/ palp, after LR BP 110/64, HR 80 16 resp, 310 CBG.

## 2024-09-08 NOTE — Assessment & Plan Note (Signed)
 Continue Keppra

## 2024-09-08 NOTE — ED Notes (Signed)
 Patient transported to MRI

## 2024-09-08 NOTE — Assessment & Plan Note (Signed)
 Continue Creon .

## 2024-09-08 NOTE — Subjective & Objective (Signed)
 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 that was only treated medically secondary to decreased functional status  Patient has known left fifth metatarsal osteomyelitis for which she was admitted back in October 2025 at that time he was started on IV antibiotics vascular surgery was consulted ABIs at that time showed bilateral lower extremity vascular disease and vascular surgery has recommended above-knee amputation family at that time was considering comfort care patient was switched to Augmentin  and doxycycline  patient felt to be medically unstable for amputation  Today vascular consult was called again and recommended the patient if chooses to undergo amputation is to be transferred to [facility] but if he wishes to be medically treated he can be admitted for comfort care at [facility] long.  ER provider discussed case with patient who at this point has declined amputation

## 2024-09-08 NOTE — H&P (Addendum)
 Brandon [name] FMW:[id number] DOB: 1966-04-19 DOA: 09/08/2024     PCP: Donal Channing SQUIBB, FNP   Outpatient Specialists:   CARDS:  at [facility]   Patient arrived to ER on 09/08/24 at 1349 Referred by Attending Silvester Ales, MD   Patient coming from:    From facility   SNF     Chief Complaint:   Chief Complaint  Patient presents with   Wound Infection    HPI: Brandon [name] is a 58 y.o. 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, left foot osteomyelitis, hypertension, past history of tobacco and alcohol  abuse now in remission    Presented with hypotension Patient was brought in from [facility] provider was concerned patient may be septic noted to be hypotensive with blood pressures in the 70s patient required LR and route 600 mL patient does have known history of wounds With left fifth metatarsal osteomyelitis due to diabetic foot ulcer Patient has a complex past medical history including history of stroke with left hemiparesis he is bedbound with contractures, history of 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 in April 2025 that was only treated medically secondary to decreased functional status  Patient has known left fifth metatarsal osteomyelitis for which she was admitted back in October 2025 at that time he was started on IV antibiotics vascular surgery was consulted ABIs at that time showed bilateral lower extremity vascular disease and vascular surgery has recommended above-knee amputation family at that time was considering comfort care patient was switched to Augmentin  and doxycycline  patient felt to be medically unstable for amputation  Today vascular consult was called again and recommended the patient if chooses to undergo  amputation is to be transferred to [facility] but if he wishes to be medically treated he can be admitted for comfort care at [facility] long.  ER provider discussed case with patient who at this point has declined amputation   Spoke myself extensively with patient and family both in agreement at this point he would like to continue to be of IV antibiotics gentle hydration but would like to avoid any aggressive interventions for the overall like to continue with comfort care and agreeable to palliative care consult  Denies significant ETOH intake   Does not smoke     Regarding pertinent Chronic problems:    Hyperlipidemia -  on statins Crestor  Lipid Panel     Component Value Date/Time   CHOL 74 03/06/2016 0916   TRIG 51 03/06/2016 0916   HDL 27 (L) 03/06/2016 0916   CHOLHDL 2.7 03/06/2016 0916   VLDL 10 03/06/2016 0916   LDLCALC 37 03/06/2016 0916      last echo  Recent Results (from the past 56199 hours)  ECHOCARDIOGRAM COMPLETE   Collection Time: 01/09/24  8:15 AM  Result Value   BP 151/96   Single Plane A2C EF 70.0   Single Plane A4C EF 60.3   Calc EF 66.5   S' Lateral 2.30   AR max vel 1.57   AV Area VTI 1.70   AV Mean grad 4.0   AV Peak grad 8.5   Ao pk vel 1.46   Area-P 1/2 2.82   AV Area mean vel 1.62   MV VTI 1.27   Est EF 65 - 70%   Narrative      ECHOCARDIOGRAM  REPORT       1. Left ventricular ejection fraction, by estimation, is 65 to 70%. The left ventricle has normal function. There is mild left ventricular hypertrophy. Left ventricular diastolic parameters were normal.  2. Right ventricular systolic function is normal. The right ventricular size is normal.  3. The mitral valve is normal in structure. No evidence of mitral valve regurgitation.  4. AV is thickened, moderately calcified with no significant stenosis.. The aortic valve is tricuspid. Aortic valve regurgitation is not visualized. Aortic valve sclerosis/calcification is present, without any evidence  of aortic stenosis.           CAD  - On   statin,   Plavix                  -  followed by cardiology              DM 2 -  Lab Results  Component Value Date   HGBA1C 9.5 (H) 06/24/2024   on insulin ,       Hx of CVA - with residual deficits on Plavix  he has had multiple strokes, subsequent intraparenchymal hemorrhage.          Dementia - on MRI scans for the last several years have shown age-advanced cerebral atrophy, possibly alcohol  related, or premature dementia.      Chronic anemia - baseline hg Hemoglobin & Hematocrit  Recent Labs    06/26/24 1019 06/30/24 0437 09/08/24 1436  HGB 8.2* 8.3* 7.5*   Iron/TIBC/Ferritin/ %Sat    Component Value Date/Time   IRON 14 (L) 06/26/2024 1019   TIBC 214 (L) 06/26/2024 1019   FERRITIN 134 06/26/2024 1019   IRONPCTSAT 7 (L) 06/26/2024 1019    Seizure DO - currently on keppra ,       While in ER:   Vascular surgery has been consulted recommended for patient to either undergo transfer to [facility] and amputation or patient can be admitted to [facility] for comfort care patient and his family has had extensive discussion patient at this point wishes to be comfort care only and has declined amputation I have personally discussed case with his family (sister ) who is MPOA who is in agreement     Lab Orders         Blood Cultures x 2 sites         Resp panel by RT-PCR (RSV, Flu A&B, Covid) Anterior Nasal Swab         Comprehensive metabolic panel         CBC with Differential         Protime-INR         Urinalysis, w/ Reflex to Culture (Infection Suspected) -Urine, Clean Catch         I-Stat Lactic Acid, ED         I-Stat Lactic Acid, ED         I-Stat CG4 Lactic Acid     Plain images showed fracture of the base of the fourth metatarsal concerning for osteomyelitis of the posterior calcaneus    Following Medications were ordered in ER: Medications  lactated ringers  infusion ( Intravenous New Bag/Given 09/08/24 1532)   Ampicillin -Sulbactam (UNASYN ) 3 g in sodium chloride  0.9 % 100 mL IVPB (0 g Intravenous Stopped 09/08/24 1551)    And  vancomycin  (VANCOCIN ) IVPB 1000 mg/200 mL premix (0 mg Intravenous Stopped 09/08/24 1619)  lactated ringers  bolus 1,000 mL (0 mLs Intravenous Stopped 09/08/24 1604)    And  lactated ringers  bolus 1,000 mL (0 mLs Intravenous Stopped 09/08/24 1610)    _______________________________________________________ ER Provider Called:    Vascular surgery Dr. Serene  They Recommend admit to medicine for comfort care if patient refuses amputation and/or transfer to [facility] if patient wishes to undergo amputation.  At this point after careful discussion with patient and family both have declined amputation    ED Triage Vitals [09/08/24 1415]  Encounter Vitals Group     BP 97/61     Girls Systolic BP Percentile      Girls Diastolic BP Percentile      Boys Systolic BP Percentile      Boys Diastolic BP Percentile      Pulse Rate 77     Resp 15     Temp 98.8 F (37.1 C)     Temp Source Rectal     SpO2 99 %     Weight      Height      Head Circumference      Peak Flow      Pain Score      Pain Loc      Pain Education      Exclude from Growth Chart   UFJK(75)@     _________________________________________ Significant initial  Findings: Abnormal Labs Reviewed  COMPREHENSIVE METABOLIC PANEL WITH GFR - Abnormal; Notable for the following components:      Result Value   Glucose, Bld 152 (*)    Creatinine, Ser 0.59 (*)    Albumin  2.6 (*)    AST 58 (*)    Alkaline Phosphatase 202 (*)    All other components within normal limits  CBC WITH DIFFERENTIAL/PLATELET - Abnormal; Notable for the following components:   WBC 21.3 (*)    RBC 2.80 (*)    Hemoglobin 7.5 (*)    HCT 25.3 (*)    MCHC 29.6 (*)    RDW 17.3 (*)    Platelets 481 (*)    Neutro Abs 18.5 (*)    Abs Immature Granulocytes 0.24 (*)    All other components within normal limits  PROTIME-INR - Abnormal;  Notable for the following components:   Prothrombin Time 16.0 (*)    All other components within normal limits  I-STAT CG4 LACTIC ACID, ED - Abnormal; Notable for the following components:   Lactic Acid, Venous 2.3 (*)    All other components within normal limits  I-STAT CG4 LACTIC ACID, ED - Abnormal; Notable for the following components:   Lactic Acid, Venous 2.5 (*)    All other components within normal limits  I-STAT CG4 LACTIC ACID, ED - Abnormal; Notable for the following components:   Lactic Acid, Venous 3.1 (*)    All other components within normal limits    ____________________ This patient meets SIRS Criteria and may be septic.   The recent clinical data is shown below. Vitals:   09/08/24 1615 09/08/24 1730 09/08/24 1800 09/08/24 1853  BP: (!) 84/58 118/75 113/84   Pulse: 71 90 97   Resp: 19 18 20    Temp:    99.2 F (37.3 C)  TempSrc:      SpO2: 100% 100% 98%      WBC     Component Value Date/Time   WBC 21.3 (H) 09/08/2024 1436   LYMPHSABS 1.5 09/08/2024 1436   MONOABS 1.0 09/08/2024 1436   EOSABS 0.0 09/08/2024 1436   BASOSABS 0.0 09/08/2024 1436      Lactic Acid, Venous  Component Value Date/Time   LATICACIDVEN 3.1 (HH) 09/08/2024 1945       ABX started Antibiotics Given (last 72 hours)     Date/Time Action Medication Dose Rate   09/08/24 1519 New Bag/Given   vancomycin  (VANCOCIN ) IVPB 1000 mg/200 mL premix 1,000 mg 200 mL/hr   09/08/24 1521 New Bag/Given   Ampicillin -Sulbactam (UNASYN ) 3 g in sodium chloride  0.9 % 100 mL IVPB 3 g 200 mL/hr         __________________________________________________________ Recent Labs  Lab 09/08/24 1436  NA 141  K 3.6  CO2 26  GLUCOSE 152*  BUN 14  CREATININE 0.59*  CALCIUM  9.2    Cr   stable,    Lab Results  Component Value Date   CREATININE 0.59 (L) 09/08/2024   CREATININE 0.51 (L) 06/30/2024   CREATININE 0.45 (L) 06/26/2024    Recent Labs  Lab 09/08/24 1436  AST 58*  ALT 29  ALKPHOS  202*  BILITOT <0.2  PROT 7.4  ALBUMIN  2.6*   Lab Results  Component Value Date   CALCIUM  9.2 09/08/2024   PHOS 4.3 06/26/2024        Plt: Lab Results  Component Value Date   PLT 481 (H) 09/08/2024    Recent Labs  Lab 09/08/24 1436  WBC 21.3*  NEUTROABS 18.5*  HGB 7.5*  HCT 25.3*  MCV 90.4  PLT 481*    HG/HCT   Down from baseline see below    Component Value Date/Time   HGB 7.5 (L) 09/08/2024 1436   HCT 25.3 (L) 09/08/2024 1436   MCV 90.4 09/08/2024 1436       _______________________________________________ Hospitalist was called for admission for   Sepsis, due to unspecified organism   Osteomyelitis of left foot,    The following Work up has been ordered so far:  Orders Placed This Encounter  Procedures   Critical Care   Blood Cultures x 2 sites   Resp panel by RT-PCR (RSV, Flu A&B, Covid) Anterior Nasal Swab   MRI Left foot without contrast   DG Foot 2 Views Left   Comprehensive metabolic panel   CBC with Differential   Protime-INR   Urinalysis, w/ Reflex to Culture (Infection Suspected) -Urine, Clean Catch   Diet NPO time specified   Initiate Carrier Fluid Protocol   Document height and weight   Assess and Document Glasgow Coma Scale   Document vital signs within 1-hour of fluid bolus completion. Notify provider of abnormal vital signs despite fluid resuscitation.   DO NOT delay antibiotics if unable to obtain blood culture.   Refer to Sidebar Report: Sepsis Sidebar ED/IP   Notify provider for difficulties obtaining IV access.   Insert peripheral IV x 2   Cardiac Monitoring - Continuous Indefinite   Code Sepsis activation.  This occurs automatically when order is signed and prioritizes pharmacy, lab, and radiology services for STAT collections and interventions.  If [facility] downtime, call [facility] [phone]) to activate Code Sepsis.   monitoring by pharmacy   Consult to orthopedic surgery   Consult to vascular surgery   Consult to hospitalist    I-Stat Lactic Acid, ED   I-Stat Lactic Acid, ED   I-Stat CG4 Lactic Acid   Admit to Inpatient (patient's expected length of stay will be greater than 2 midnights or inpatient only procedure)     OTHER Significant initial  Findings:  labs showing:     DM  labs:  HbA1C: Recent Labs    01/07/24 0457 06/24/24 0437  HGBA1C 9.4* 9.5*       CBG (last 3)  No results for input(s): GLUCAP in the last 72 hours.        Cultures:    Component Value Date/Time   SDES  06/23/2024 1458    BLOOD LEFT ARM Performed at [facility], [address] [place], [place] [address]    SDES  06/23/2024 1458    BLOOD LEFT ARM Performed at [facility], [address], [place], [place] [address]    SPECREQUEST  06/23/2024 1458    BOTTLES DRAWN AEROBIC AND ANAEROBIC Blood Culture adequate volume Performed at [facility], [address] [place], [place] [address]    SPECREQUEST  06/23/2024 1458    BOTTLES DRAWN AEROBIC AND ANAEROBIC Blood Culture results may not be optimal due to an inadequate volume of blood received in culture bottles Performed at [facility], [address] [place], [place] [address]    CULT  06/23/2024 1458    NO GROWTH 5 DAYS Performed at [facility], [address], [place], [place] [address]    CULT  06/23/2024 1458    NO GROWTH 5 DAYS Performed at [facility], [address] [place], [place] [address]    REPTSTATUS 06/28/2024 FINAL 06/23/2024 1458   REPTSTATUS 06/28/2024 FINAL 06/23/2024 1458     Radiological Exams on Admission: DG Foot 2 Views Left Result Date: 09/08/2024 CLINICAL DATA:  Infection. EXAM: LEFT FOOT - 2 VIEW COMPARISON:  Left foot radiograph dated 06/23/2024. FINDINGS: There is fracture of the base of the fourth metatarsal. Apparent erosive changes of the posterior calcaneus concerning for osteomyelitis. No other acute fracture. Evaluation for fracture  however is limited due to advanced osteopenia. Ulceration of the skin of the dorsum of the foot and calcaneus. IMPRESSION: 1. Fracture of the base of the fourth metatarsal. 2. Findings concerning for osteomyelitis of the posterior calcaneus. MRI or a white blood cell nuclear scan may provide better evaluation. 3. Ulceration of the skin of the dorsum of the calcaneus. Electronically Signed   By: Vanetta Chou M.D.   On: 09/08/2024 15:27   _______________________________________________________________________________________________________ Latest  Blood pressure 113/84, pulse 97, temperature 99.2 F (37.3 C), resp. rate 20, SpO2 98%.   Vitals  labs and radiology finding personally reviewed  Review of Systems:    Pertinent positives include:   , fatigue, Constitutional:  No weight loss, night sweats, Fevers, chills weight loss  HEENT:  No headaches, Difficulty swallowing,Tooth/dental problems,Sore throat,  No sneezing, itching, ear ache, nasal congestion, post nasal drip,  Cardio-vascular:  No chest pain, Orthopnea, PND, anasarca, dizziness, palpitations.no Bilateral lower extremity swelling  GI:  No heartburn, indigestion, abdominal pain, nausea, vomiting, diarrhea, change in bowel habits, loss of appetite, melena, blood in stool, hematemesis Resp:  no shortness of breath at rest. No dyspnea on exertion, No excess mucus, no productive cough, No non-productive cough, No coughing up of blood.No change in color of mucus.No wheezing. Skin:  no rash or lesions. No jaundice GU:  no dysuria, change in color of urine, no urgency or frequency. No straining to urinate.  No flank pain.  Musculoskeletal:  No joint pain or no joint swelling. No decreased range of motion. No back pain.  Psych:  No change in mood or affect. No depression or anxiety. No memory loss.  Neuro: no localizing neurological complaints, no tingling, no weakness, no double vision, no gait abnormality, no slurred speech, no  confusion  All systems reviewed and apart from HOPI all are negative _______________________________________________________________________________________________ Past Medical History:   Past Medical History:  Diagnosis Date   Alcohol  dependency (HCC)    Hx ETOH withdrawal seizures before 2011   CAD (coronary artery disease) 06/13/2012   Calcification noted on CTA of chest in 2012 Wall motion abnormality on ECHO     Carotid artery disease 2016   bilateral.  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    Depression with anxiety    Diabetes mellitus without complication (HCC)    GERD (gastroesophageal reflux disease)    Headache(784.0)    migraine   Heart disease 02/2015   PCI/DES placed to RCA: on chronic Plavix /ASA   Hyperlipidemia    Hypertension    Pancreatitis 2011....    CT findings in May 2011 with inflammation and pseudocyst.  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/2016   Renal artery stenosis, native, bilateral 02/2015      Past Surgical History:  Procedure Laterality Date   CARDIAC CATHETERIZATION N/A 03/15/2015   Procedure: Left Heart Cath;  Surgeon: Denyse DELENA Bathe, MD;  Location: [facility] INVASIVE CV LAB;  Service: Cardiovascular;  Laterality: N/A;   CARDIAC CATHETERIZATION N/A 03/16/2015   Procedure: Coronary Stent Intervention;  Surgeon: Cara JONETTA Lovelace, MD;  Location: [facility] INVASIVE CV LAB;  Service: Cardiovascular;  Laterality: N/A;   CAROTID ANGIOGRAM N/A 06/15/2012   Procedure: CAROTID ANGIOGRAM;  Surgeon: Lonni GORMAN Blade, MD;  Location: [facility] CATH LAB;  Service: Cardiovascular;  Laterality: N/A;   ESOPHAGOGASTRODUODENOSCOPY N/A 03/08/2016   Procedure: ESOPHAGOGASTRODUODENOSCOPY (EGD);  Surgeon: Elspeth Deward Naval, MD;  Location: [facility] ENDOSCOPY;  Service: Gastroenterology;  Laterality: N/A;   none     PERIPHERAL VASCULAR CATHETERIZATION Left 04/06/2015   Procedure: Carotid PTA/Stent  Intervention;  Surgeon: Selinda GORMAN Gu, MD;  Location: [facility] INVASIVE CV LAB;  Service: Cardiovascular;  Laterality: Left;    Social History:  Ambulatory   bed bound     reports that he has been smoking cigarettes. He has a 30 pack-year smoking history. He has never used smokeless tobacco. He reports that he does not drink alcohol  and does not use drugs.     Family History:   Family History  Problem Relation Age of Onset   Stroke Mother        deceased   Coronary artery disease Mother    Alcohol  abuse Mother    Cancer Mother    Hypertension Father        alive   Alcohol  abuse Father    Diabetes Father    Kidney disease Father    Stroke Maternal Grandmother    ______________________________________________________________________________________________ Allergies: Allergies[1]   Prior to Admission medications  Medication Sig Start Date End Date Taking? Authorizing Provider  acetaminophen  (TYLENOL ) 325 MG tablet Take 650 mg by mouth every 6 (six) hours as needed for mild pain (pain score 1-3) (cannot exceed a total of 3,000 mg/24 hours).    [provider]  amoxicillin -clavulanate (AUGMENTIN ) 875-125 MG tablet Take 1 tablet by mouth every 12 (twelve) hours. 07/07/24   Vann, Jessica U, DO  ascorbic acid  (VITAMIN C ) 500 MG tablet Take 500 mg by mouth daily. 11/26/21   [provider]  baclofen  (LIORESAL ) 20 MG tablet Take 1 tablet (20 mg total) by mouth 3 (three) times daily. 07/07/24   Juvenal Harlene PENNER, DO  busPIRone  (BUSPAR ) 10 MG tablet  Take 1 tablet (10 mg total) by mouth 2 (two) times daily. 12/23/20   Gherghe, Costin M, MD  clopidogrel  (PLAVIX ) 75 MG tablet Take 1 tablet (75 mg total) by mouth daily. 08/14/23   Elnora Ip, MD  doxycycline  (VIBRA -TABS) 100 MG tablet Take 1 tablet (100 mg total) by mouth every 12 (twelve) hours. 07/07/24   Vann, Jessica U, DO  famotidine  (PEPCID ) 20 MG tablet Take 1 tablet (20 mg total) by mouth 2 (two) times daily.  12/23/20   Gherghe, Costin M, MD  feeding supplement, GLUCERNA SHAKE, (GLUCERNA SHAKE) LIQD Take 237 mLs by mouth in the morning, at noon, and at bedtime.    [provider]  ferrous sulfate  325 (65 FE) MG tablet Take 325 mg by mouth daily with breakfast.    [provider]  insulin  glargine (LANTUS ) 100 UNIT/ML injection Inject 14 Units into the skin in the morning.    [provider]  insulin  lispro (HUMALOG KWIKPEN) 100 UNIT/ML KwikPen Inject 0-10 Units into the skin See admin instructions. Inject 0-10 units into the skin three times a day, per sliding scale: BGL 70-140 = give nothing; 141-170 = 2 units; 201-230 = 3 units; 231-260 = 4 units; 261-290 = 5 units; 291-320 = 6 units; 321-350 = 7 units; 351-380 = 8 units; 381-400 = 10 units; <70 OR >400 = CALL MD    [provider]  levETIRAcetam  (KEPPRA ) 500 MG tablet Take 500 mg by mouth 2 (two) times daily.    [provider]  melatonin 3 MG TABS tablet Take 3 mg by mouth at bedtime.    [provider]  metFORMIN  (GLUCOPHAGE ) 500 MG tablet Take 500 mg by mouth 2 (two) times daily.    [provider]  midodrine  (PROAMATINE ) 10 MG tablet Take 1 tablet (10 mg total) by mouth 3 (three) times daily with meals. Please hold for systolic blood pressure more than 110. Patient taking differently: Take 10 mg by mouth See admin instructions. Take 10 mg by mouth three times a day and hold for a Systolic number more than 110 5/69/74   Elgergawy, Brayton RAMAN, MD  mirtazapine  (REMERON ) 7.5 MG tablet Take 7.5 mg by mouth at bedtime.    [provider]  Multiple Vitamin (MULTIVITAMIN) tablet Take 1 tablet by mouth daily with breakfast.    [provider]  mupirocin  ointment (BACTROBAN ) 2 % Apply 1 Application topically daily. Patient not taking: Reported on 06/23/2024 03/31/24   [name], MD  ondansetron  (ZOFRAN ) 4 MG tablet Take 4 mg by mouth every 6 (six) hours as needed for nausea or  vomiting.    [provider]  oxyCODONE  (OXY IR/ROXICODONE ) 5 MG immediate release tablet Take 1 tablet (5 mg total) by mouth every 6 (six) hours as needed for severe pain (pain score 7-10). 07/07/24   Vann, Jessica U, DO  Pancrelipase , Lip-Prot-Amyl, (CREON ) 3000-9500 units CPEP Take 3,000 Units by mouth 3 (three) times daily.    [provider]  polyethylene glycol (MIRALAX  / GLYCOLAX ) 17 g packet Take 17 g by mouth daily as needed. Patient taking differently: Take 17 g by mouth daily as needed for mild constipation. 01/20/24   Dennise Lavada POUR, MD  rosuvastatin  (CRESTOR ) 5 MG tablet Take 5 mg by mouth at bedtime.    [provider]  senna (SENOKOT) 8.6 MG TABS tablet Take 2 tablets by mouth in the morning and at bedtime.    [provider]  sertraline  (ZOLOFT ) 100 MG  tablet Take 100 mg by mouth daily.    [provider]  tamsulosin  (FLOMAX ) 0.4 MG CAPS capsule Take 1 capsule (0.4 mg total) by mouth daily after supper. Patient taking differently: Take 0.4 mg by mouth at bedtime. 08/01/23   Volney, [place], MD    ___________________________________________________________________________________________________ Physical Exam:    09/08/2024    6:00 PM 09/08/2024    5:30 PM 09/08/2024    4:15 PM  Vitals with BMI  Systolic 113 118 84  Diastolic 84 75 58  Pulse 97 90 71     1. General:  in No  Acute distress  * Chronically ill *well *cachectic *toxic acutely ill -appearing 2. Psychological: Alert and   Oriented to self 3. Head/ENT:  Dry Mucous Membranes                          Head Non traumatic, neck supple                      Poor Dentition 4. SKIN: decreased Skin turgor,  Skin clean     5. Heart: Regular rate and rhythm no  Murmur, no Rub or gallop 6. Lungs:   no wheezes or crackles   7. Abdomen: Soft,  non-tender, Non distended  thin  bowel sounds decreased  8. Lower extremities: no clubbing, cyanosis, no  edema 9.  Neurologically left hemiparesis with contractures 10. MSK: Normal range of motion    Chart has been reviewed  ______________________________________________________________________________________________  Assessment/Plan 58 y.o. 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, left foot osteomyelitis, hypertension, past history of tobacco and alcohol  abuse now in remission  Admitted for   Sepsis,  Osteomyelitis of left foot    Present on Admission:  Sepsis (HCC)  Hyperlipidemia  Alcohol  abuse  CAD in native artery  Chronic alcoholic pancreatitis (HCC)  Essential hypertension  Osteomyelitis (HCC)     History of CVA (cerebrovascular accident) With severe left hemiparesis bedbound Continue Plavix  75 mg daily  Hyperlipidemia Chronic stable continue Crestor  5 mg a day  Uncontrolled type 2 diabetes mellitus with hyperglycemia, with long-term current use of insulin  (HCC)  - Order Sensitive   SSI   - continue home insulin  but decreased to  15 units,  -  check TSH and HgA1C  - Hold by mouth medications    Alcohol  abuse In remission  CAD in native artery Chronic stable continue Crestor  5 mg a day and Plavix   Chronic alcoholic pancreatitis (HCC) Continue Creon   Essential hypertension Allow permissive hypertension  History of seizure Continue Keppra   Osteomyelitis (HCC) Severe osteomyelitis of left lower extremity Discussed at length with patient and family at this point they would like patient to be comfort care although okay to continue with antibiotics and gentle hydration Continue Unasyn  and vancomycin  for now Vascular consult has been called at this point patient declining amputation Palliative care consult placed Pain management as needed  Sepsis (HCC)  -SIRS criteria met with   elevated white blood cell count,       Component Value Date/Time   WBC 21.3 (H) 09/08/2024 1436   LYMPHSABS 1.5 09/08/2024 1436     tachycardia   ,  Today's Vitals   09/08/24 1800 09/08/24 1853 09/08/24 2130 09/08/24 2200  BP: 113/84  102/76 106/71  Pulse: 97  92  Resp: 20  13 12   Temp:  99.2 F (37.3 C)    TempSrc:      SpO2: 98%   98%         -Most likely source being: Osteomyelitis   Patient meeting criteria for Severe sepsis with    evidence of end organ damage/organ dysfunction such as   elevated lactic acid >2     Component Value Date/Time   LATICACIDVEN 3.1 (HH) 09/08/2024 1945     SBP<90 mmhg or MAP < 65 mmhg,    Patient is meeting criteria for SEPTIC SHOCK with  lactic acid > 4 or multiple SBP readings below 90 mmhg or MAP reading below 65 mmhg   - Obtain serial lactic acid and procalcitonin level.  - Initiated IV antibiotics in ER: Antibiotics Given (last 72 hours)     Date/Time Action Medication Dose Rate   09/08/24 1519 New Bag/Given   vancomycin  (VANCOCIN ) IVPB 1000 mg/200 mL premix 1,000 mg 200 mL/hr   09/08/24 1521 New Bag/Given   Ampicillin -Sulbactam (UNASYN ) 3 g in sodium chloride  0.9 % 100 mL IVPB 3 g 200 mL/hr         - await results of blood and urine culture   Intravenous fluids were administered,     Patient is in Septic shock with  (SBP < 90 mmhg/MAP < 65 mmhg  ). Fluid resuscitation however was limited because  (patient is enrolled in Hospice with comfort care status OR of shared decision making with patient/ guardian, who prefer limited resuscitation with fluid  10:20 PM    Other plan as per orders.  DVT prophylaxis:  SCD     Code Status:   DNR/DNI  comfort care as per patient  family  I had personally discussed CODE STATUS with patient and family  ACP    none  Family Communication:   Family not at  Bedside  plan of care was discussed on the phone with  Sister,    Diet  diabetic   Disposition Plan:     Back to current  facility when stable                             Following barriers for discharge:                              Pain controlled with PO medications                                                          Will need consultants to evaluate patient prior to discharge                     Consult Orders  (From admission, onward)           Start     Ordered   09/08/24 1849  Consult to hospitalist  Once       Provider:  (Not yet assigned)  Question Answer Comment  Place call to: [facility]   Reason for Consult Admit      09/08/24 1848  Palliative care    consulted                                     Consults called:   Treatment Team:  Serene Gaile ORN, MD  Admission status:  ED Disposition     ED Disposition  Admit   Condition  --   Comment  Hospital Area: [facility] [[id number]]  Level of Care: Progressive [102]  Admit to Progressive based on following criteria: MULTISYSTEM THREATS such as stable sepsis, metabolic/electrolyte imbalance with or without encephalopathy that is responding to early treatment.  May admit patient to [facility] or [facility] if equivalent level of care is available:: Yes  Diagnosis: Sepsis [facility]) [[id number]]  Admitting Physician: Allesha Aronoff [[id number]]  Attending Physician: Skiler Olden [[id number]]  Certification:: I certify this patient will need inpatient services for at least 2 midnights  Expected Medical Readiness: 09/13/2024           inpatient     I Expect 2 midnight stay secondary to severity of patient's current illness need for inpatient interventions justified by the following:  hemodynamic instability despite optimal treatment (tachycardia  hypotension )   Severe lab/radiological/exam abnormalities including:    Sepsis, due to unspecified organism, unspecified whether acute organ dysfunction present (HCC)    Osteomyelitis of left foot, unspecified type (HCC)     and extensive comorbidities including: DM2    That are currently affecting medical management.   I expect  patient to be hospitalized for 2 midnights requiring inpatient medical care.  Patient is at high risk for adverse outcome (such as loss of life or disability)  Patient and family at this point chose to be comfort care    Indication for inpatient stay as follows:   Hemodynamic instability despite maximal medical therapy,  severe pain requiring acute inpatient management,    Need for IV antibiotics, IV fluids,, IV pain medications, I     Level of care      medical floor    Blease Quiver 09/08/2024, 10:25 PM    [facility]     after 2 AM please page floor coverage   If 7AM-7PM, please contact the day team taking care of the patient using [email or web address]        [1] No Known Allergies

## 2024-09-08 NOTE — Assessment & Plan Note (Signed)
 Chronic stable continue Crestor  5 mg a day and Plavix 

## 2024-09-08 NOTE — Assessment & Plan Note (Signed)
Chronic stable continue Crestor 5 mg a day

## 2024-09-09 ENCOUNTER — Encounter (HOSPITAL_COMMUNITY): Payer: Self-pay | Admitting: Internal Medicine

## 2024-09-09 DIAGNOSIS — M869 Osteomyelitis, unspecified: Secondary | ICD-10-CM | POA: Diagnosis not present

## 2024-09-09 DIAGNOSIS — Z7189 Other specified counseling: Secondary | ICD-10-CM

## 2024-09-09 DIAGNOSIS — Z8673 Personal history of transient ischemic attack (TIA), and cerebral infarction without residual deficits: Secondary | ICD-10-CM | POA: Diagnosis not present

## 2024-09-09 LAB — CBC
HCT: 24.2 % — ABNORMAL LOW (ref 39.0–52.0)
Hemoglobin: 7.4 g/dL — ABNORMAL LOW (ref 13.0–17.0)
MCH: 27.4 pg (ref 26.0–34.0)
MCHC: 30.6 g/dL (ref 30.0–36.0)
MCV: 89.6 fL (ref 80.0–100.0)
Platelets: 492 K/uL — ABNORMAL HIGH (ref 150–400)
RBC: 2.7 MIL/uL — ABNORMAL LOW (ref 4.22–5.81)
RDW: 17.2 % — ABNORMAL HIGH (ref 11.5–15.5)
WBC: 25.2 K/uL — ABNORMAL HIGH (ref 4.0–10.5)
nRBC: 0 % (ref 0.0–0.2)

## 2024-09-09 LAB — GLUCOSE, CAPILLARY: Glucose-Capillary: 104 mg/dL — ABNORMAL HIGH (ref 70–99)

## 2024-09-09 MED ORDER — ONDANSETRON 4 MG PO TBDP: 4.0000 mg | ORAL_TABLET | Freq: Four times a day (QID) | ORAL | Status: DC | PRN

## 2024-09-09 MED ORDER — ONDANSETRON HCL 4 MG/2ML IJ SOLN: 4.0000 mg | Freq: Four times a day (QID) | INTRAMUSCULAR | Status: DC | PRN

## 2024-09-09 MED ORDER — HALOPERIDOL LACTATE 2 MG/ML PO CONC: 0.5000 mg | ORAL | Status: DC | PRN

## 2024-09-09 MED ORDER — HYDROXYZINE HCL 25 MG PO TABS
25.0000 mg | ORAL_TABLET | Freq: Once | ORAL | Status: AC
  Administered 2024-09-09: 03:00:00 25 mg via ORAL
  Filled 2024-09-09: qty 1

## 2024-09-09 MED ORDER — INSULIN ASPART 100 UNIT/ML IJ SOLN: 0.0000 [IU] | Freq: Three times a day (TID) | INTRAMUSCULAR | Status: DC

## 2024-09-09 MED ORDER — HALOPERIDOL 0.5 MG PO TABS: 0.5000 mg | ORAL_TABLET | ORAL | Status: DC | PRN

## 2024-09-09 MED ORDER — LORAZEPAM 1 MG PO TABS: 1.0000 mg | ORAL_TABLET | ORAL | Status: DC | PRN

## 2024-09-09 MED ORDER — HALOPERIDOL LACTATE 5 MG/ML IJ SOLN
0.5000 mg | INTRAMUSCULAR | Status: DC | PRN
  Administered 2024-09-10: 0.5 mg via INTRAVENOUS
  Filled 2024-09-09: qty 1

## 2024-09-09 MED ORDER — MORPHINE SULFATE (PF) 2 MG/ML IV SOLN
1.0000 mg | INTRAVENOUS | Status: DC | PRN
  Administered 2024-09-09 – 2024-09-10 (×2): 1 mg via INTRAVENOUS
  Filled 2024-09-09 (×2): qty 1

## 2024-09-09 MED ORDER — LORAZEPAM 2 MG/ML PO CONC: 1.0000 mg | ORAL | Status: DC | PRN

## 2024-09-09 MED ORDER — LORAZEPAM 2 MG/ML IJ SOLN
1.0000 mg | INTRAMUSCULAR | Status: DC | PRN
  Administered 2024-09-09 – 2024-09-10 (×2): 1 mg via INTRAVENOUS
  Filled 2024-09-09 (×2): qty 1

## 2024-09-09 NOTE — Plan of Care (Signed)

## 2024-09-09 NOTE — Assessment & Plan Note (Signed)
-   See discussions above - Continue comfort care

## 2024-09-09 NOTE — Assessment & Plan Note (Signed)
 SSI for now

## 2024-09-09 NOTE — Assessment & Plan Note (Signed)
 Continue Keppra

## 2024-09-09 NOTE — Plan of Care (Signed)

## 2024-09-09 NOTE — Progress Notes (Signed)
 Progress Note    ADIT RIDDLES   FMW:[id number]  DOB: [birth year]  DOA: 09/08/2024     1 PCP: Donal Channing SQUIBB, FNP  Initial CC: Left foot wound  Hospital Course: Brandon [name] is a 58 yo male with PMH alcohol  dependency with withdrawal seizures, CAD, CVA, DM II, depression/anxiety, GERD, HLD, HTN, pancreatitis, renal artery stenosis, PAD. He presented from [facility] with hypotension and worsening of left foot diabetic ulcer. He is bedbound at baseline with chronic contractures in lower extremities. He has undergone evaluation in the past with vascular surgery and offered left AKA but has continuously declined surgery.  Interval History:  Resting in bed this morning appearing uncomfortable.  He asked to help reposition him in bed multiple times but no positions were comfortable and he had significant pain with movements. Discussed overall goals of care with Luke on the phone this afternoon.  We have further clarified goals and plans.  Transitioning to full comfort care at this time for the main focus.  Hospice to be engaged.  Assessment and Plan: * Osteomyelitis (HCC) - Extensive wound involving left lower extremity with tissue loss on left foot and osteomyelitis of left 4th and 5th toe.  Significant lower extremity contractures -In the past vascular surgery has offered left AKA with patient changing mind multiple times and ultimately has declined. -Very low chance he would be a good surgical candidate at this point anyways; main goal needs to be focused on comfort care at this point - Discussed at length with his sister, Luke on 12/18.  Patient does not have enough capacity to weigh in on depth of conversations.  At this point since he has had progressive decline over the past several months, especially with previous palliative care conversations on focusing on comfort care, he is no longer a good candidate for aggressive treatment. -Discontinue fluids and antibiotics -See GOC  discussions  Goals of care, counseling/discussion - Patient has had ongoing progressive functional decline for several months.  He has extensive tissue loss and ulceration to left foot with underlying osteomyelitis of at least 4th and 5th digits along with necrotic wounds that have low chance of healing with his severely malnourished state -Antibiotics are futile in this clinical context; surgery is no longer a viable option with his severe cachexia and poor wound healing ability -Have discussed this at length with Luke on 12/18.  Discontinuing antibiotics, fluids, nonessential treatment - Hospice consulted; may be a good candidate for [employer] [facility], sister is in agreement if appropriate and accepted - transition to full comfort at this point; focus is quality of life at this point  History of seizure - Continue Keppra   History of CVA (cerebrovascular accident) - Bedbound with chronic lower extremity contractures - Continue comfort care  Uncontrolled type 2 diabetes mellitus with hyperglycemia, with long-term current use of insulin  (HCC) - SSI for now  Sepsis (HCC) - See discussions above - Continue comfort care  CAD in native artery - Continue comfort care  Hyperlipidemia - Discontinue treatment  Essential hypertension - Continue comfort care    Antimicrobials:   DVT prophylaxis:  SCDs Start: 09/08/24 2313   Code Status:   Code Status: Do not attempt resuscitation (DNR) - Comfort care  Mobility Assessment (Last 72 Hours)     Mobility Assessment     Row Name 09/09/24 0800 09/09/24 0010         Does the patient have exclusion criteria? No- Perform mobility assessment No- Perform mobility assessment  What is the highest level of mobility based on the mobility assessment? Level 1 (Bedfast) - Unable to balance while sitting on edge of bed Level 1 (Bedfast) - Unable to balance while sitting on edge of bed      Is the above level different from baseline mobility prior  to current illness? No - Consider discontinuing PT/OT No - Consider discontinuing PT/OT         Diet: Diet Orders (From admission, onward)     Start     Ordered   09/09/24 1343  Diet regular Fluid consistency: Thin  Diet effective now       Question:  Fluid consistency:  Answer:  Thin   09/09/24 1342            Barriers to discharge: none Disposition Plan:  Inpatient death anticipated vs [facility]  HH orders placed: n/a Status is: Inpt  Objective: Blood pressure (!) 152/127, pulse 97, temperature 99.8 F (37.7 C), temperature source Oral, resp. rate 20, height 6' 1 (1.854 m), weight 61.3 kg, SpO2 96%.  Examination:  Physical Exam Constitutional:      Comments: Cachectic and extremely chronically ill-appearing adult man resting in bed very uncomfortable but no distress.  Appears older than stated age  HENT:     Head: Normocephalic and atraumatic.     Mouth/Throat:     Mouth: Mucous membranes are moist.  Eyes:     Extraocular Movements: Extraocular movements intact.  Cardiovascular:     Rate and Rhythm: Normal rate and regular rhythm.  Pulmonary:     Effort: Pulmonary effort is normal. No respiratory distress.     Breath sounds: Normal breath sounds. No wheezing.  Abdominal:     General: Bowel sounds are normal. There is no distension.     Palpations: Abdomen is soft.     Tenderness: There is no abdominal tenderness.  Musculoskeletal:     Cervical back: Normal range of motion and neck supple.     Comments: Contracted lower extremities  Skin:    Comments: Scattered skin abrasions and breakdown  Neurological:     Comments: Able to follow some commands and engage in conversation however does have some obvious cognitive deficits at times and not able to completely comprehend complexity of conversation  Psychiatric:        Behavior: Behavior normal.      Consultants:    Procedures:    Data Reviewed: Results for orders placed or performed during the  hospital encounter of 09/08/24 (from the past 24 hours)  Comprehensive metabolic panel     Status: Abnormal   Collection Time: 09/08/24  2:36 PM  Result Value Ref Range   Sodium 141 135 - 145 mmol/L   Potassium 3.6 3.5 - 5.1 mmol/L   Chloride 103 98 - 111 mmol/L   CO2 26 22 - 32 mmol/L   Glucose, Bld 152 (H) 70 - 99 mg/dL   BUN 14 6 - 20 mg/dL   Creatinine, Ser 9.40 (L) 0.61 - 1.24 mg/dL   Calcium  9.2 8.9 - 10.3 mg/dL   Total Protein 7.4 6.5 - 8.1 g/dL   Albumin  2.6 (L) 3.5 - 5.0 g/dL   AST 58 (H) 15 - 41 U/L   ALT 29 0 - 44 U/L   Alkaline Phosphatase 202 (H) 38 - 126 U/L   Total Bilirubin <0.2 0.0 - 1.2 mg/dL   GFR, Estimated >39 >39 mL/min   Anion gap 13 5 - 15  CBC with Differential  Status: Abnormal   Collection Time: 09/08/24  2:36 PM  Result Value Ref Range   WBC 21.3 (H) 4.0 - 10.5 K/uL   RBC 2.80 (L) 4.22 - 5.81 MIL/uL   Hemoglobin 7.5 (L) 13.0 - 17.0 g/dL   HCT 74.6 (L) 60.9 - 47.9 %   MCV 90.4 80.0 - 100.0 fL   MCH 26.8 26.0 - 34.0 pg   MCHC 29.6 (L) 30.0 - 36.0 g/dL   RDW 82.6 (H) 88.4 - 84.4 %   Platelets 481 (H) 150 - 400 K/uL   nRBC 0.0 0.0 - 0.2 %   Neutrophils Relative % 87 %   Neutro Abs 18.5 (H) 1.7 - 7.7 K/uL   Lymphocytes Relative 7 %   Lymphs Abs 1.5 0.7 - 4.0 K/uL   Monocytes Relative 5 %   Monocytes Absolute 1.0 0.1 - 1.0 K/uL   Eosinophils Relative 0 %   Eosinophils Absolute 0.0 0.0 - 0.5 K/uL   Basophils Relative 0 %   Basophils Absolute 0.0 0.0 - 0.1 K/uL   Immature Granulocytes 1 %   Abs Immature Granulocytes 0.24 (H) 0.00 - 0.07 K/uL  Protime-INR     Status: Abnormal   Collection Time: 09/08/24  2:36 PM  Result Value Ref Range   Prothrombin Time 16.0 (H) 11.4 - 15.2 seconds   INR 1.2 0.8 - 1.2  Blood Cultures x 2 sites     Status: None (Preliminary result)   Collection Time: 09/08/24  2:37 PM   Specimen: BLOOD LEFT FOREARM  Result Value Ref Range   Specimen Description      BLOOD LEFT FOREARM Performed at [facility],  [address], [place], [place] [address]    Special Requests      BOTTLES DRAWN AEROBIC AND ANAEROBIC Blood Culture results may not be optimal due to an inadequate volume of blood received in culture bottles Performed at [facility], [address], [place], [place] [address]    Culture      NO GROWTH < 12 HOURS Performed at [facility], [address] [place], [place] [address]    Report Status PENDING   I-Stat Lactic Acid, ED     Status: Abnormal   Collection Time: 09/08/24  2:44 PM  Result Value Ref Range   Lactic Acid, Venous 2.3 (HH) 0.5 - 1.9 mmol/L   Comment NOTIFIED PHYSICIAN   Resp panel by RT-PCR (RSV, Flu A&B, Covid) Anterior Nasal Swab     Status: None   Collection Time: 09/08/24  3:31 PM   Specimen: Anterior Nasal Swab  Result Value Ref Range   SARS Coronavirus 2 by RT PCR NEGATIVE NEGATIVE   Influenza A by PCR NEGATIVE NEGATIVE   Influenza B by PCR NEGATIVE NEGATIVE   Resp Syncytial Virus by PCR NEGATIVE NEGATIVE  I-Stat Lactic Acid, ED     Status: Abnormal   Collection Time: 09/08/24  4:41 PM  Result Value Ref Range   Lactic Acid, Venous 2.5 (HH) 0.5 - 1.9 mmol/L   Comment NOTIFIED PHYSICIAN   I-Stat CG4 Lactic Acid     Status: Abnormal   Collection Time: 09/08/24  7:45 PM  Result Value Ref Range   Lactic Acid, Venous 3.1 (HH) 0.5 - 1.9 mmol/L   Comment NOTIFIED PHYSICIAN   CBC     Status: Abnormal   Collection Time: 09/09/24  1:10 AM  Result Value Ref Range   WBC 25.2 (H) 4.0 - 10.5 K/uL  RBC 2.70 (L) 4.22 - 5.81 MIL/uL   Hemoglobin 7.4 (L) 13.0 - 17.0 g/dL   HCT 75.7 (L) 60.9 - 47.9 %   MCV 89.6 80.0 - 100.0 fL   MCH 27.4 26.0 - 34.0 pg   MCHC 30.6 30.0 - 36.0 g/dL   RDW 82.7 (H) 88.4 - 84.4 %   Platelets 492 (H) 150 - 400 K/uL   nRBC 0.0 0.0 - 0.2 %    I have reviewed pertinent nursing notes, vitals, labs, and images as necessary. I have ordered labwork to follow up on as indicated.  I have reviewed the last notes from  staff over past 24 hours. I have discussed patient's care plan and test results with nursing staff, CM/SW, and other staff as appropriate.  Old records reviewed in assessment of this patient  Time spent: Greater than 50% of the 55 minute visit was spent in counseling/coordination of care for the patient as laid out in the A&P.   LOS: 1 day   Alm Apo, MD [facility] 09/09/2024, 1:59 PM

## 2024-09-09 NOTE — Hospital Course (Signed)
 [name] is a 58 yo male with PMH alcohol  dependency with withdrawal seizures, CAD, CVA, DM II, depression/anxiety, GERD, HLD, HTN, pancreatitis, renal artery stenosis, PAD. He presented from [facility] with hypotension and worsening of left foot diabetic ulcer. He is bedbound at baseline with chronic contractures in lower extremities. He has undergone evaluation in the past with vascular surgery and offered left AKA but has continuously declined surgery.

## 2024-09-09 NOTE — Assessment & Plan Note (Addendum)
-   Extensive wound involving left lower extremity with tissue loss on left foot and osteomyelitis of left 4th and 5th toe.  Significant lower extremity contractures -In the past vascular surgery has offered left AKA with patient changing mind multiple times and ultimately has declined. -Very low chance he would be a good surgical candidate at this point anyways; main goal needs to be focused on comfort care at this point - Discussed at length with his sister, Luke on 12/18.  Patient does not have enough capacity to weigh in on depth of conversations.  At this point since he has had progressive decline over the past several months, especially with previous palliative care conversations on focusing on comfort care, he is no longer a good candidate for aggressive treatment. -Discontinue fluids and antibiotics -See GOC discussions

## 2024-09-09 NOTE — Assessment & Plan Note (Signed)
-   Continue comfort care 

## 2024-09-09 NOTE — Assessment & Plan Note (Signed)
-   Discontinue treatment

## 2024-09-09 NOTE — Assessment & Plan Note (Signed)
-   Patient has had ongoing progressive functional decline for several months.  He has extensive tissue loss and ulceration to left foot with underlying osteomyelitis of at least 4th and 5th digits along with necrotic wounds that have low chance of healing with his severely malnourished state -Antibiotics are futile in this clinical context; surgery is no longer a viable option with his severe cachexia and poor wound healing ability -Have discussed this at length with Luke on 12/18.  Discontinuing antibiotics, fluids, nonessential treatment - Hospice consulted; may be a good candidate for [facility], sister is in agreement if appropriate and accepted - transition to full comfort at this point; focus is quality of life at this point

## 2024-09-09 NOTE — Consult Note (Addendum)
 WOC Nurse Consult Note: Consult requested for left foot wounds.  Performed remotely after review of progress notes and photos in the EMR.  Left foot/lower leg with extensive areas of slough/eschar and several full thickness necrotic wounds.     According to progress notes, Patient has known left fifth metatarsal osteomyelitis for which she was admitted back in October 2025 at that time he was started on IV antibiotics vascular surgery was consulted ABIs at that time showed bilateral lower extremity vascular disease and vascular surgery has recommended above-knee amputation family at that time was considering comfort care patient was switched to Augmentin  and doxycycline  patient felt to be medically unstable for amputation.  Today vascular consult was called again and recommended the patient if chooses to undergo amputation is to be transferred to [facility] but if he wishes to be medically treated he can be admitted for comfort care at [facility] long.  ER provider discussed case with patient who at this point has declined amputation.  Spoke myself extensively with patient and family both in agreement at this point he would like to continue to be of IV antibiotics gentle hydration but would like to avoid any aggressive interventions for the overall like to continue with comfort care.   Dressing procedure/placement/frequency: Topical treatment orders will not promote healing with this complex medical condition and the goals are directed to minimizing discomfort with dressing changes and controlling odor and drainage.  Topical treatment orders provided for bedside nurses to perform as follows: Apply Xeroform gauze to left foot/leg wounds Q day, then cover with ABD pad and kerlex  Please re-consult if further assistance is needed.  Thank-you,  Stephane Fought MSN, RN, CWOCN, CWCN-AP, CNS Contact Mon-Fri 0700-1500: [phone]

## 2024-09-09 NOTE — Assessment & Plan Note (Signed)
-   Bedbound with chronic lower extremity contractures - Continue comfort care

## 2024-09-09 NOTE — TOC Initial Note (Signed)
 Transition of Care [facility]) - Initial/Assessment Note    Patient Details  Name: Brandon [name] MRN: [id number] Date of Birth: [birth year]  Transition of Care [facility]) CM/SW Contact:    Toy LITTIE Agar, RN Phone Number:[phone]  09/09/2024, 2:32 PM  Clinical Narrative:                 Inpatient care manager acknowledges consult for Hospice with family requesting [facility]. Referral has been accepted by [facility] with [facility]. CM  will follow for disposition planning.   Expected Discharge Plan: Hospice Medical Facility Barriers to Discharge: Continued Medical Work up   Patient Goals and CMS Choice Patient states their goals for this hospitalization and ongoing recovery are:: Wants to transition to comfort care CMS Medicare.gov Compare Post Acute Care list provided to:: Patient Represenative (must comment) Choice offered to / list presented to : Patient, Sibling [place] ownership interest in [facility].provided to::  (n/a)    Expected Discharge Plan and Services In-house Referral: NA Discharge Planning Services: NA Post Acute Care Choice: Hospice Living arrangements for the past 2 months: Skilled Nursing Facility                 DME Arranged: N/A DME Agency: NA       HH Arranged: NA HH Agency: NA        Prior Living Arrangements/Services Living arrangements for the past 2 months: Skilled Nursing Facility Lives with:: Roommate Patient language and need for interpreter reviewed:: Yes Do you feel safe going back to the place where you live?: Yes      Need for Family Participation in Patient Care: Yes (Comment) Care giver support system in place?: Yes (comment)   Criminal Activity/Legal Involvement Pertinent to Current Situation/Hospitalization: No - Comment as needed  Activities of Daily Living   ADL Screening (condition at time of admission) Independently performs ADLs?: No Does the patient have a NEW difficulty with  bathing/dressing/toileting/self-feeding that is expected to last >3 days?: No Does the patient have a NEW difficulty with getting in/out of bed, walking, or climbing stairs that is expected to last >3 days?: No Does the patient have a NEW difficulty with communication that is expected to last >3 days?: No Is the patient deaf or have difficulty hearing?: No Does the patient have difficulty seeing, even when wearing glasses/contacts?: No Does the patient have difficulty concentrating, remembering, or making decisions?: No  Permission Sought/Granted Permission sought to share information with : Family Supports Permission granted to share information with : No              Emotional Assessment Appearance:: Appears older than stated age Attitude/Demeanor/Rapport: Unable to Assess Affect (typically observed): Unable to Assess Orientation: : Oriented to Self Alcohol  / Substance Use: Not Applicable Psych Involvement: No (comment)  Admission diagnosis:  Sepsis (HCC) [A41.9] Osteomyelitis of left foot, unspecified type (HCC) [M86.9] Sepsis, due to unspecified organism, unspecified whether acute organ dysfunction present [facility]) [A41.9] Patient Active Problem List   Diagnosis Date Noted   Osteomyelitis (HCC) 09/08/2024   Diabetic foot ulcer (HCC) 06/23/2024   Decreased pedal pulses 06/23/2024   H/O supraventricular tachycardia 03/28/2024   Acute cystitis 03/28/2024   Hematuria 03/28/2024   History of seizure 03/28/2024   Chronic hypotension 03/28/2024   Pancreatic insufficiency 03/28/2024   Generalized anxiety disorder 03/28/2024   Acute cystitis with hematuria 03/28/2024   Flank pain 01/08/2024   Pneumococcal bacteremia 01/08/2024   Pneumaturia 01/08/2024   Sepsis (HCC) 01/07/2024  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   SVT (supraventricular tachycardia) 01/07/2024   Infection due to ESBL-producing Klebsiella pneumoniae 01/07/2024   Gram-negative bacteremia 01/07/2024   Hyperglycemia  01/07/2024   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   Abnormal findings on diagnostic imaging of spine 01/06/2024   Acute pyelonephritis 01/06/2024   Fall 08/01/2023   ICH (intracerebral hemorrhage) (HCC) 07/30/2023   Pain in left shoulder 03/25/2022   Pressure injury of skin 01/08/2022   Frequent falls 01/08/2022   DKA (diabetic ketoacidosis) (HCC) 01/07/2022   Rhabdomyolysis 01/07/2022   Protein-calorie malnutrition, severe 12/23/2020   Hyperosmolar hyperglycemic state (HHS) (HCC) 12/22/2020   Uncontrolled type 2 diabetes mellitus with hyperglycemia, with long-term current use of insulin  (HCC) 12/21/2020   Hyperglycemia due to diabetes mellitus (HCC) 12/21/2020   Empyema lung (HCC)    Epidural abscess    Diskitis 02/06/2020   Alcohol  withdrawal delirium (HCC) 02/06/2020   Epilepsy (HCC) 02/06/2020   Pleural effusion on right 02/06/2020   History of CVA (cerebrovascular accident) 01/23/2017   Dry eyes    Sleep disturbance    Essential hypertension, benign    Upper GI bleed    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   Fatty liver    Tobacco abuse    Insulin  dependent type 2 diabetes mellitus (HCC)    History of CVA with residual deficit    Acute lower UTI    Wide-complex tachycardia    Chronic alcoholic pancreatitis (HCC)    Hyponatremia 03/09/2016   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   Acute blood loss anemia 03/09/2016   Gastric varices    Alcohol  abuse    Left-sided neglect    Normocytic anemia    UGIB (upper gastrointestinal bleed)    Pressure ulcer 03/06/2016   Acute encephalopathy    Cerebral infarction (HCC) 03/05/2016   Carotid stenosis 04/06/2015   CAD in native artery 03/16/2015   Unstable angina pectoris (HCC) 03/15/2015   Neck pain 10/20/2013   Insomnia 12/29/2012   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 02/25/2011   Hepatic steatosis 02/25/2011   PCP:  Donal Channing SQUIBB, FNP Pharmacy:   [facility] [place] - [place], [place] - [address] [place] [place] [address] Phone: [phone] Fax: [phone]     Social Drivers of Health (SDOH) Social History: SDOH Screenings   Food Insecurity: No Food Insecurity (09/09/2024)  Housing: Low Risk (09/09/2024)  Transportation Needs: No Transportation Needs (09/09/2024)  Utilities: Not At Risk (09/09/2024)  Physical Activity: Inactive (11/15/2021)  Stress: No Stress Concern Present (10/31/2021)  Tobacco Use: High Risk (06/30/2024)   SDOH Interventions:     Readmission Risk Interventions    09/09/2024    2:22 PM 06/25/2024   11:20 AM 01/08/2024   11:12 AM  Readmission Risk Prevention Plan  Transportation Screening Complete Complete Complete  PCP or Specialist Appt within 3-5 Days Complete    HRI or Home Care Consult Complete    Social Work Consult for  Recovery Care Planning/Counseling Complete    Palliative Care Screening Complete    Medication Review (RN Care Manager) Complete Complete Complete  PCP or Specialist appointment within 3-5 days of discharge  Complete Complete  HRI or Home Care Consult  Complete Complete  SW Recovery Care/Counseling Consult  Complete Complete  Palliative Care Screening   Not Applicable  Skilled Nursing Facility  Complete Complete

## 2024-09-10 DIAGNOSIS — M869 Osteomyelitis, unspecified: Secondary | ICD-10-CM | POA: Diagnosis not present

## 2024-09-10 DIAGNOSIS — Z8673 Personal history of transient ischemic attack (TIA), and cerebral infarction without residual deficits: Secondary | ICD-10-CM | POA: Diagnosis not present

## 2024-09-10 DIAGNOSIS — Z7189 Other specified counseling: Secondary | ICD-10-CM | POA: Diagnosis not present

## 2024-09-10 LAB — GLUCOSE, CAPILLARY
Glucose-Capillary: 108 mg/dL — ABNORMAL HIGH (ref 70–99)
Glucose-Capillary: 122 mg/dL — ABNORMAL HIGH (ref 70–99)

## 2024-09-10 NOTE — TOC Transition Note (Signed)
 Transition of Care [facility]) - Discharge Note   Patient Details  Name: Brandon [name] MRN: [id number] Date of Birth: 19-Aug-1966  Transition [facility] Care [facility]) CM/SW Contact:  Toy LITTIE Agar, RN Phone Number:[phone]  09/10/2024, 12:37 PM   Clinical Narrative:    Patient discharging to [facility]. Transportation has been arranged per [facility]. CM attempted to update sister [name]. There is no answer, voicemail has been left to call CM. Discharge packet is at nurses station in patients chart. No other needs noted. Nurse made aware.      Barriers to Discharge: Continued Medical Work up   Patient Goals and CMS Choice Patient states their goals for this hospitalization and ongoing recovery are:: Wants to transition to comfort care CMS Medicare.gov Compare Post Acute Care list provided to:: Patient Represenative (must comment) Choice offered to / list presented to : Patient, Sibling [place] ownership interest in [facility].provided to::  (n/a)    Discharge Placement                       Discharge Plan and Services Additional resources added to the After Visit Summary for   In-house Referral: NA Discharge Planning Services: NA Post Acute Care Choice: Hospice          DME Arranged: N/A DME Agency: NA       HH Arranged: NA HH Agency: NA        Social Drivers of Health (SDOH) Interventions SDOH Screenings   Food Insecurity: No Food Insecurity (09/09/2024)  Housing: Low Risk (09/09/2024)  Transportation Needs: No Transportation Needs (09/09/2024)  Utilities: Not At Risk (09/09/2024)  Physical Activity: Inactive (11/15/2021)  Stress: No Stress Concern Present (10/31/2021)  Tobacco Use: High Risk (09/09/2024)     Readmission Risk Interventions    09/09/2024    2:22 PM 06/25/2024   11:20 AM 01/08/2024   11:12 AM  Readmission Risk Prevention Plan  Transportation Screening Complete Complete Complete  PCP or Specialist Appt within 3-5 Days Complete     HRI or Home Care Consult Complete    Social Work Consult for Recovery Care Planning/Counseling Complete    Palliative Care Screening Complete    Medication Review Oceanographer) Complete Complete Complete  PCP or Specialist appointment within 3-5 days of discharge  Complete Complete  HRI or Home Care Consult  Complete Complete  SW Recovery Care/Counseling Consult  Complete Complete  Palliative Care Screening   Not Applicable  Skilled Nursing Facility  Complete Complete

## 2024-09-10 NOTE — Care Management Important Message (Signed)
 Important Message  Patient Details IM Letter not given due to Comfort Care. Name: Brandon [name] MRN: [id number] Date of Birth: 04/20/1966   Important Message Given:  No     [name] 09/10/2024, 12:16 PM

## 2024-09-10 NOTE — Progress Notes (Signed)
 Nutrition Brief Note  RD received consult for assessment 12/18. Chart reviewed. Pt now transitioning to comfort care.  No nutrition interventions planned at this time.   Morna Lee, MS, RD, LDN Inpatient Clinical Dietitian Contact via Secure chat

## 2024-09-10 NOTE — Plan of Care (Signed)
  Problem: Education: Goal: Knowledge of General Education information will improve Description: Including pain rating scale, medication(s)/side effects and non-pharmacologic comfort measures Outcome: Not Progressing   Problem: Health Behavior/Discharge Planning: Goal: Ability to manage health-related needs will improve Outcome: Not Progressing   Problem: Clinical Measurements: Goal: Ability to maintain clinical measurements within normal limits will improve Outcome: Not Progressing Goal: Will remain free from infection Outcome: Not Progressing Goal: Diagnostic test results will improve Outcome: Not Progressing Goal: Respiratory complications will improve Outcome: Not Progressing Goal: Cardiovascular complication will be avoided Outcome: Not Progressing   Problem: Activity: Goal: Risk for activity intolerance will decrease Outcome: Not Progressing   Problem: Nutrition: Goal: Adequate nutrition will be maintained Outcome: Not Progressing   Problem: Coping: Goal: Level of anxiety will decrease Outcome: Not Progressing   Problem: Elimination: Goal: Will not experience complications related to bowel motility Outcome: Not Progressing Goal: Will not experience complications related to urinary retention Outcome: Not Progressing   Problem: Pain Managment: Goal: General experience of comfort will improve and/or be controlled Outcome: Not Progressing   Problem: Safety: Goal: Ability to remain free from injury will improve Outcome: Not Progressing   Problem: Skin Integrity: Goal: Risk for impaired skin integrity will decrease Outcome: Not Progressing   Problem: Education: Goal: Ability to describe self-care measures that may prevent or decrease complications (Diabetes Survival Skills Education) will improve Outcome: Not Progressing Goal: Individualized Educational Video(s) Outcome: Not Progressing   Problem: Coping: Goal: Ability to adjust to condition or change in  health will improve Outcome: Not Progressing   Problem: Fluid Volume: Goal: Ability to maintain a balanced intake and output will improve Outcome: Not Progressing   Problem: Health Behavior/Discharge Planning: Goal: Ability to identify and utilize available resources and services will improve Outcome: Not Progressing Goal: Ability to manage health-related needs will improve Outcome: Not Progressing   Problem: Metabolic: Goal: Ability to maintain appropriate glucose levels will improve Outcome: Not Progressing   Problem: Nutritional: Goal: Maintenance of adequate nutrition will improve Outcome: Not Progressing Goal: Progress toward achieving an optimal weight will improve Outcome: Not Progressing   Problem: Skin Integrity: Goal: Risk for impaired skin integrity will decrease Outcome: Not Progressing   Problem: Tissue Perfusion: Goal: Adequacy of tissue perfusion will improve Outcome: Not Progressing   Problem: Education: Goal: Knowledge of the prescribed therapeutic regimen will improve Outcome: Not Progressing   Problem: Coping: Goal: Ability to identify and develop effective coping behavior will improve Outcome: Not Progressing   Problem: Clinical Measurements: Goal: Quality of life will improve Outcome: Not Progressing   Problem: Respiratory: Goal: Verbalizations of increased ease of respirations will increase Outcome: Not Progressing   Problem: Role Relationship: Goal: Family's ability to cope with current situation will improve Outcome: Not Progressing Goal: Ability to verbalize concerns, feelings, and thoughts to partner or family member will improve Outcome: Not Progressing   Problem: Pain Management: Goal: Satisfaction with pain management regimen will improve Outcome: Not Progressing

## 2024-09-10 NOTE — Progress Notes (Signed)
 [facility] Liaison Note  Received request from [facility] for patient/family interest in [facility]. Eligibility confirmed. Met with patient and spoke with sister to confirm interest and explain services. Family agreeable to transfer today. TOC aware. RN please call report to [phone] prior to patient leaving the unit. Please send signed DNR with patient at discharge.   Thank you for allowing us  to participate in this patient's care.  Eleanor Nail, LPN [facility] Liaison [phone]

## 2024-09-10 NOTE — Progress Notes (Signed)
 Report called to [facility] at [facility] regarding transport to facility

## 2024-09-10 NOTE — Discharge Summary (Signed)
 " Physician Discharge Summary   Brandon [name] FMW:[id number] DOB: [birth year] DOA: 09/08/2024  PCP: Donal Channing SQUIBB, FNP  Admit date: 09/08/2024 Discharge date: 09/10/2024  Admitted From: Home  Disposition:  [facility] Discharging physician: Alm Apo, MD Barriers to discharge: none  Recommendations at discharge: Continue comfort care/end of life management    Discharge Condition: fair CODE STATUS: DNR Diet recommendation:  Diet Orders (From admission, onward)     Start     Ordered   09/09/24 1343  Diet regular Fluid consistency: Thin  Diet effective now       Question:  Fluid consistency:  Answer:  Thin   09/09/24 1342            Hospital Course: [name] is a 58 yo male with PMH alcohol  dependency with withdrawal seizures, CAD, CVA, DM II, depression/anxiety, GERD, HLD, HTN, pancreatitis, renal artery stenosis, PAD. He presented from [facility] with hypotension and worsening of left foot diabetic ulcer. He is bedbound at baseline with chronic contractures in lower extremities. He has undergone evaluation in the past with vascular surgery and offered left AKA but has continuously declined surgery.  Assessment and Plan: * Osteomyelitis (HCC) - Extensive wound involving left lower extremity with tissue loss on left foot and osteomyelitis of left 4th and 5th toe.  Significant lower extremity contractures -In the past vascular surgery has offered left AKA with patient changing mind multiple times and ultimately has declined. -Very low chance he would be a good surgical candidate at this point anyways; main goal needs to be focused on comfort care at this point - Discussed at length with his sister, Brandon [name] on 12/18.  Patient does not have enough capacity to weigh in on depth of conversations.  At this point since he has had progressive decline over the past several months, especially with previous palliative care conversations on focusing on comfort care, he is  no longer a good candidate for aggressive treatment. -Discontinue fluids and antibiotics -See GOC discussions  Goals of care, counseling/discussion - Patient has had ongoing progressive functional decline for several months.  He has extensive tissue loss and ulceration to left foot with underlying osteomyelitis of at least 4th and 5th digits along with necrotic wounds that have low chance of healing with his severely malnourished state -Antibiotics are futile in this clinical context; surgery is no longer a viable option with his severe cachexia and poor wound healing ability -Have discussed this at length with Brandon [name] on 12/18.  Discontinuing antibiotics, fluids, nonessential treatment - Hospice consulted; may be a good candidate for [facility], sister is in agreement if appropriate and accepted - transition to full comfort at this point; focus is quality of life at this point  History of seizure - Continue Keppra   History of CVA (cerebrovascular accident) - Bedbound with chronic lower extremity contractures - Continue comfort care  Uncontrolled type 2 diabetes mellitus with hyperglycemia, with long-term current use of insulin  (HCC) Has not needed insulin .  Can discontinue at this time  Sepsis [facility]) - See discussions above - Continue comfort care  CAD in native artery - Continue comfort care  Hyperlipidemia - Discontinue treatment  Essential hypertension - Continue comfort care    Principal Diagnosis: Osteomyelitis [facility])  Discharge Diagnoses: Active Hospital Problems   Diagnosis Date Noted   Osteomyelitis (HCC) 09/08/2024    Priority: 1.   Goals of care, counseling/discussion 06/25/2012    Priority: 2.   History of seizure 03/28/2024    Priority:  4.   History of CVA (cerebrovascular accident) 01/23/2017    Priority: 4.   Uncontrolled type 2 diabetes mellitus with hyperglycemia, with long-term current use of insulin  (HCC) 12/21/2020    Priority: 5.   Sepsis (HCC)  01/07/2024   CAD in native artery 03/16/2015   Hyperlipidemia    Essential hypertension 02/25/2011    Resolved Hospital Problems  No resolved problems to display.     Discharge Instructions     Discharge wound care:   Complete by: As directed    Apply Xeroform gauze to left foot/leg wounds Q day, then cover with ABD pad and kerlex      Allergies as of 09/10/2024   No Known Allergies      Medication List     STOP taking these medications    acetaminophen  325 MG tablet Commonly known as: TYLENOL    acetaminophen  500 MG tablet Commonly known as: TYLENOL    amoxicillin -clavulanate 875-125 MG tablet Commonly known as: AUGMENTIN    ascorbic acid  500 MG tablet Commonly known as: VITAMIN C    baclofen  20 MG tablet Commonly known as: LIORESAL    busPIRone  10 MG tablet Commonly known as: BUSPAR    cefTRIAXone  2 g injection Commonly known as: ROCEPHIN    clopidogrel  75 MG tablet Commonly known as: PLAVIX    Creon  3000-9500 units Cpep Generic drug: Pancrelipase  (Lip-Prot-Amyl)   doxycycline  100 MG tablet Commonly known as: VIBRA -TABS   famotidine  20 MG tablet Commonly known as: PEPCID    feeding supplement (GLUCERNA SHAKE) Liqd   feeding supplement (PRO-STAT SUGAR FREE 64) Liqd   ferrous sulfate  324 MG Tbec   HumaLOG KwikPen 100 UNIT/ML KwikPen Generic drug: insulin  lispro   melatonin 3 MG Tabs tablet   metFORMIN  500 MG tablet Commonly known as: GLUCOPHAGE    midodrine  10 MG tablet Commonly known as: PROAMATINE    mirtazapine  15 MG tablet Commonly known as: REMERON    multivitamin tablet   mupirocin  ointment 2 % Commonly known as: BACTROBAN    ondansetron  4 MG tablet Commonly known as: ZOFRAN    oxycodone  5 MG capsule Commonly known as: OXY-IR   oxyCODONE  5 MG immediate release tablet Commonly known as: Oxy IR/ROXICODONE    polyethylene glycol 17 g packet Commonly known as: MIRALAX  / GLYCOLAX    rosuvastatin  5 MG tablet Commonly known as:  CRESTOR    Santyl  250 UNIT/GM ointment Generic drug: collagenase    Semglee  (yfgn) 100 UNIT/ML Pen Generic drug: insulin  glargine   senna 8.6 MG Tabs tablet Commonly known as: SENOKOT   sertraline  100 MG tablet Commonly known as: ZOLOFT    tamsulosin  0.4 MG Caps capsule Commonly known as: FLOMAX        TAKE these medications    levETIRAcetam  500 MG tablet Commonly known as: KEPPRA  Take 500 mg by mouth 2 (two) times daily.               Discharge Care Instructions  (From admission, onward)           Start     Ordered   09/10/24 0000  Discharge wound care:       Comments: Apply Xeroform gauze to left foot/leg wounds Q day, then cover with ABD pad and kerlex   09/10/24 1201            Allergies[1]  Consultations:   Procedures:   Discharge Exam: BP (!) 80/58 (BP Location: Left Arm)   Pulse 92   Temp (!) 101.5 F (38.6 C) (Oral)   Resp 16   Ht 6' 1 (1.854 m)   Wt  61.3 kg   SpO2 95%   BMI 17.83 kg/m  Physical Exam Constitutional:      Comments: Cachectic and extremely chronically ill-appearing adult man resting in bed very uncomfortable but no distress.  Appears older than stated age  HENT:     Head: Normocephalic and atraumatic.     Mouth/Throat:     Mouth: Mucous membranes are moist.  Eyes:     Extraocular Movements: Extraocular movements intact.  Cardiovascular:     Rate and Rhythm: Normal rate and regular rhythm.  Pulmonary:     Effort: Pulmonary effort is normal. No respiratory distress.     Breath sounds: Normal breath sounds. No wheezing.  Abdominal:     General: Bowel sounds are normal. There is no distension.     Palpations: Abdomen is soft.     Tenderness: There is no abdominal tenderness.  Musculoskeletal:     Cervical back: Normal range of motion and neck supple.     Comments: Contracted lower extremities  Skin:    Comments: Scattered skin abrasions and breakdown  Neurological:     Comments: Able to follow some commands  and engage in conversation however does have some obvious cognitive deficits at times and not able to completely comprehend complexity of conversation  Psychiatric:        Behavior: Behavior normal.      The results of significant diagnostics from this hospitalization (including imaging, microbiology, ancillary and laboratory) are listed below for reference.   Microbiology: Recent Results (from the past 240 hours)  Blood Cultures x 2 sites     Status: None (Preliminary result)   Collection Time: 09/08/24  2:37 PM   Specimen: BLOOD LEFT FOREARM  Result Value Ref Range Status   Specimen Description   Final    BLOOD LEFT FOREARM Performed at [facility], [address] [place], [place] [address]    Special Requests   Final    BOTTLES DRAWN AEROBIC AND ANAEROBIC Blood Culture results may not be optimal due to an inadequate volume of blood received in culture bottles Performed at [facility], [address] [place], [place] [address]    Culture   Final    NO GROWTH 2 DAYS Performed at [facility], [address] [place], [place] [address]    Report Status PENDING  Incomplete  Resp panel by RT-PCR (RSV, Flu A&B, Covid) Anterior Nasal Swab     Status: None   Collection Time: 09/08/24  3:31 PM   Specimen: Anterior Nasal Swab  Result Value Ref Range Status   SARS Coronavirus 2 by RT PCR NEGATIVE NEGATIVE Final    Comment: (NOTE) SARS-CoV-2 target nucleic acids are NOT DETECTED.  The SARS-CoV-2 RNA is generally detectable in upper respiratory specimens during the acute phase of infection. The lowest concentration of SARS-CoV-2 viral copies this assay can detect is 138 copies/mL. A negative result does not preclude SARS-Cov-2 infection and should not be used as the sole basis for treatment or other patient management decisions. A negative result may occur with  improper specimen collection/handling, submission of specimen other than nasopharyngeal  swab, presence of viral mutation(s) within the areas targeted by this assay, and inadequate number of viral copies(<138 copies/mL). A negative result must be combined with clinical observations, patient history, and epidemiological information. The expected result is Negative.  Fact Sheet for Patients:  bloggercourse.com  Fact Sheet for Healthcare Providers:  seriousbroker.it  This test is no t yet approved  or cleared by the [place]  FDA and  has been authorized for detection and/or diagnosis of SARS-CoV-2 by FDA under an Emergency Use Authorization (EUA). This EUA will remain  in effect (meaning this test can be used) for the duration of the COVID-19 declaration under Section 564(b)(1) of the Act, 21 U.S.C.section 360bbb-3(b)(1), unless the authorization is terminated  or revoked sooner.       Influenza A by PCR NEGATIVE NEGATIVE Final   Influenza B by PCR NEGATIVE NEGATIVE Final    Comment: (NOTE) The Xpert Xpress SARS-CoV-2/FLU/RSV plus assay is intended as an aid in the diagnosis of influenza from Nasopharyngeal swab specimens and should not be used as a sole basis for treatment. Nasal washings and aspirates are unacceptable for Xpert Xpress SARS-CoV-2/FLU/RSV testing.  Fact Sheet for Patients: bloggercourse.com  Fact Sheet for Healthcare Providers: seriousbroker.it  This test is not yet approved or cleared by the [place]  FDA and has been authorized for detection and/or diagnosis of SARS-CoV-2 by FDA under an Emergency Use Authorization (EUA). This EUA will remain in effect (meaning this test can be used) for the duration of the COVID-19 declaration under Section 564(b)(1) of the Act, 21 U.S.C. section 360bbb-3(b)(1), unless the authorization is terminated or revoked.     Resp Syncytial Virus by PCR NEGATIVE NEGATIVE Final    Comment: (NOTE) Fact Sheet for  Patients: bloggercourse.com  Fact Sheet for Healthcare Providers: seriousbroker.it  This test is not yet approved or cleared by the [place]  FDA and has been authorized for detection and/or diagnosis of SARS-CoV-2 by FDA under an Emergency Use Authorization (EUA). This EUA will remain in effect (meaning this test can be used) for the duration of the COVID-19 declaration under Section 564(b)(1) of the Act, 21 U.S.C. section 360bbb-3(b)(1), unless the authorization is terminated or revoked.  Performed at [facility], [address] [place], [place] [address]   Blood Cultures x 2 sites     Status: None (Preliminary result)   Collection Time: 09/09/24  1:10 AM   Specimen: BLOOD RIGHT ARM  Result Value Ref Range Status   Specimen Description   Final    BLOOD RIGHT ARM Performed at [facility], [address], [place], [place] [address]    Special Requests   Final    BOTTLES DRAWN AEROBIC ONLY Blood Culture results may not be optimal due to an inadequate volume of blood received in culture bottles Performed at [facility], [address] [place], [place] [address]    Culture   Final    NO GROWTH 1 DAY Performed at [facility], [address] [place], [place] [address]    Report Status PENDING  Incomplete     Labs: BNP (last 3 results) No results for input(s): BNP in the last 8760 hours. Basic Metabolic Panel: Recent Labs  Lab 09/08/24 1436  NA 141  K 3.6  CL 103  CO2 26  GLUCOSE 152*  BUN 14  CREATININE 0.59*  CALCIUM  9.2   Liver Function Tests: Recent Labs  Lab 09/08/24 1436  AST 58*  ALT 29  ALKPHOS 202*  BILITOT <0.2  PROT 7.4  ALBUMIN  2.6*   No results for input(s): LIPASE, AMYLASE in the last 168 hours. No results for input(s): AMMONIA in the last 168 hours. CBC: Recent Labs  Lab 09/08/24 1436 09/09/24 0110  WBC 21.3* 25.2*   NEUTROABS 18.5*  --   HGB 7.5* 7.4*  HCT 25.3* 24.2*  MCV 90.4 89.6  PLT 481* 492*   Cardiac Enzymes: No results for input(s): CKTOTAL, CKMB, CKMBINDEX, TROPONINI in the last 168 hours. BNP: Invalid input(s): POCBNP CBG: Recent Labs  Lab 09/09/24 2029 09/10/24 0742  GLUCAP 104* 108*   D-Dimer No results for input(s): DDIMER in the last 72 hours. Hgb A1c No results for input(s): HGBA1C in the last 72 hours. Lipid Profile No results for input(s): CHOL, HDL, LDLCALC, TRIG, CHOLHDL, LDLDIRECT in the last 72 hours. Thyroid  function studies No results for input(s): TSH, T4TOTAL, T3FREE, THYROIDAB in the last 72 hours.  Invalid input(s): FREET3 Anemia work up No results for input(s): VITAMINB12, FOLATE, FERRITIN, TIBC, IRON, RETICCTPCT in the last 72 hours. Urinalysis    Component Value Date/Time   COLORURINE RED (A) 03/28/2024 0356   APPEARANCEUR CLOUDY (A) 03/28/2024 0356   LABSPEC 1.023 03/28/2024 0356   PHURINE 5.0 03/28/2024 0356   GLUCOSEU NEGATIVE 03/28/2024 0356   HGBUR LARGE (A) 03/28/2024 0356   BILIRUBINUR NEGATIVE 03/28/2024 0356   KETONESUR NEGATIVE 03/28/2024 0356   PROTEINUR 100 (A) 03/28/2024 0356   UROBILINOGEN 0.2 02/16/2014 1742   NITRITE NEGATIVE 03/28/2024 0356   LEUKOCYTESUR SMALL (A) 03/28/2024 0356   Sepsis Labs Recent Labs  Lab 09/08/24 1436 09/09/24 0110  WBC 21.3* 25.2*   Microbiology Recent Results (from the past 240 hours)  Blood Cultures x 2 sites     Status: None (Preliminary result)   Collection Time: 09/08/24  2:37 PM   Specimen: BLOOD LEFT FOREARM  Result Value Ref Range Status   Specimen Description   Final    BLOOD LEFT FOREARM Performed at [facility], [address] [place], [place] [address]    Special Requests   Final    BOTTLES DRAWN AEROBIC AND ANAEROBIC Blood Culture results may not be optimal due to an inadequate volume of blood received in culture  bottles Performed at [facility], [address] [place], [place] [address]    Culture   Final    NO GROWTH 2 DAYS Performed at [facility], [address], [place], [place] [address]    Report Status PENDING  Incomplete  Resp panel by RT-PCR (RSV, Flu A&B, Covid) Anterior Nasal Swab     Status: None   Collection Time: 09/08/24  3:31 PM   Specimen: Anterior Nasal Swab  Result Value Ref Range Status   SARS Coronavirus 2 by RT PCR NEGATIVE NEGATIVE Final    Comment: (NOTE) SARS-CoV-2 target nucleic acids are NOT DETECTED.  The SARS-CoV-2 RNA is generally detectable in upper respiratory specimens during the acute phase of infection. The lowest concentration of SARS-CoV-2 viral copies this assay can detect is 138 copies/mL. A negative result does not preclude SARS-Cov-2 infection and should not be used as the sole basis for treatment or other patient management decisions. A negative result may occur with  improper specimen collection/handling, submission of specimen other than nasopharyngeal swab, presence of viral mutation(s) within the areas targeted by this assay, and inadequate number of viral copies(<138 copies/mL). A negative result must be combined with clinical observations, patient history, and epidemiological information. The expected result is Negative.  Fact Sheet for Patients:  bloggercourse.com  Fact Sheet for Healthcare Providers:  seriousbroker.it  This test is no t yet approved or cleared by the [place]  FDA and  has been authorized for detection and/or diagnosis of SARS-CoV-2 by FDA under an Emergency Use Authorization (EUA). This EUA will remain  in effect (  meaning this test can be used) for the duration of the COVID-19 declaration under Section 564(b)(1) of the Act, 21 U.S.C.section 360bbb-3(b)(1), unless the authorization is terminated  or revoked sooner.       Influenza A  by PCR NEGATIVE NEGATIVE Final   Influenza B by PCR NEGATIVE NEGATIVE Final    Comment: (NOTE) The Xpert Xpress SARS-CoV-2/FLU/RSV plus assay is intended as an aid in the diagnosis of influenza from Nasopharyngeal swab specimens and should not be used as a sole basis for treatment. Nasal washings and aspirates are unacceptable for Xpert Xpress SARS-CoV-2/FLU/RSV testing.  Fact Sheet for Patients: bloggercourse.com  Fact Sheet for Healthcare Providers: seriousbroker.it  This test is not yet approved or cleared by the [place]  FDA and has been authorized for detection and/or diagnosis of SARS-CoV-2 by FDA under an Emergency Use Authorization (EUA). This EUA will remain in effect (meaning this test can be used) for the duration of the COVID-19 declaration under Section 564(b)(1) of the Act, 21 U.S.C. section 360bbb-3(b)(1), unless the authorization is terminated or revoked.     Resp Syncytial Virus by PCR NEGATIVE NEGATIVE Final    Comment: (NOTE) Fact Sheet for Patients: bloggercourse.com  Fact Sheet for Healthcare Providers: seriousbroker.it  This test is not yet approved or cleared by the [place]  FDA and has been authorized for detection and/or diagnosis of SARS-CoV-2 by FDA under an Emergency Use Authorization (EUA). This EUA will remain in effect (meaning this test can be used) for the duration of the COVID-19 declaration under Section 564(b)(1) of the Act, 21 U.S.C. section 360bbb-3(b)(1), unless the authorization is terminated or revoked.  Performed at [facility], [address], [place], [place] [address]   Blood Cultures x 2 sites     Status: None (Preliminary result)   Collection Time: 09/09/24  1:10 AM   Specimen: BLOOD RIGHT ARM  Result Value Ref Range Status   Specimen Description   Final    BLOOD RIGHT ARM Performed at [facility], [address] [place], [place] [address]    Special Requests   Final    BOTTLES DRAWN AEROBIC ONLY Blood Culture results may not be optimal due to an inadequate volume of blood received in culture bottles Performed at [facility], [address] [place], [place] [address]    Culture   Final    NO GROWTH 1 DAY Performed at [facility], [address], [place], [place] [address]    Report Status PENDING  Incomplete    Procedures/Studies: MRI Left foot without contrast Result Date: 09/10/2024 EXAM: MRI of the left Foot without contrast. 09/08/2024 05:11:25 PM TECHNIQUE: Multiplanar multisequence MRI of the left foot was performed without the administration of intravenous contrast. COMPARISON: Comparison 06/24/2024 and radiographs from 09/08/2024. CLINICAL HISTORY: Soft tissue infection suspected. FINDINGS: LIMITATIONS/ARTIFACTS: Motion artifact is present, reducing diagnostic sensitivity and specificity. LISFRANC JOINT: Visualized Lisfranc ligament is intact. No significant Lisfranc interval widening or significant periligamentous edema. Ligamentous structures are grossly intact, although somewhat obscured by motion artifact. BONE MARROW: Abnormal endosteal edema along the posterior calcaneus with severe thinning of the overlying subcutaneous tissues, for example on images 24 through 29 of series 111, suspicious for severe posterior heel ulceration and active underlying calcaneal osteomyelitis. Abnormal marrow edema anteriorly in the cuboid and in the bases of the 4th and 5th metatarsals, with 4th metatarsal base edema increased from 06/24/2024; osteomyelitis is likewise not excluded. Trace endosteal edema along the cuneiforms  and bases of the first through third metatarsals and at the Chopart joint, probably reactive. No acute fracture or aggressive marrow replacing lesion. GREATER AND LESSER MTP JOINTS: No significant joint effusion or osseous erosions. No  significant degenerative changes. Normal alignment. SOFT TISSUES: Severe thinning of the overlying subcutaneous tissues, for example on images 24 through 29 of series 111, suspicious for severe posterior heel ulceration. TENDONS: Common peroneal tendon sheath tenosynovitis. Visualized flexor and extensor tendons are otherwise intact without tenosynovitis. TIBIOTALAR JOINT: Small tibiotalar joint effusion. IMPRESSION: 1. Severe posterior heel ulceration with underlying calcaneal osteomyelitis. 2. Marrow edema in the cuboid and bases of the fourth and fifth metatarsals, with increased fourth metatarsal base edema compared with 06/24/24, with osteomyelitis not excluded. 3. Common peroneal tendon sheath tenosynovitis and small tibiotalar joint effusion. 4. Trace marrow edema along the cuneiforms, bases of the first through third metatarsals, and at the Chopart joint, likely reactive, with motion artifact reducing diagnostic sensitivity and specificity. Electronically signed by: Ryan Salvage MD 09/10/2024 08:43 AM EST RP Workstation: [id number]   DG Foot 2 Views Left Result Date: 09/08/2024 CLINICAL DATA:  Infection. EXAM: LEFT FOOT - 2 VIEW COMPARISON:  Left foot radiograph dated 06/23/2024. FINDINGS: There is fracture of the base of the fourth metatarsal. Apparent erosive changes of the posterior calcaneus concerning for osteomyelitis. No other acute fracture. Evaluation for fracture however is limited due to advanced osteopenia. Ulceration of the skin of the dorsum of the foot and calcaneus. IMPRESSION: 1. Fracture of the base of the fourth metatarsal. 2. Findings concerning for osteomyelitis of the posterior calcaneus. MRI or a white blood cell nuclear scan may provide better evaluation. 3. Ulceration of the skin of the dorsum of the calcaneus. Electronically Signed   By: Vanetta Chou M.D.   On: 09/08/2024 15:27     Time coordinating discharge: Over 30 minutes    Alm Apo, MD  [facility] 09/10/2024, 12:02 PM    [1] No Known Allergies  "

## 2024-09-13 LAB — CULTURE, BLOOD (ROUTINE X 2): Culture: NO GROWTH

## 2024-09-14 LAB — CULTURE, BLOOD (ROUTINE X 2): Culture: NO GROWTH

## 2024-09-23 DEATH — deceased
